# Patient Record
Sex: Female | Born: 1955 | Race: Black or African American | Hispanic: No | Marital: Single | Smoking: Former smoker
Health system: Southern US, Community
[De-identification: ages and names within clinical notes are randomized; demographics above are authoritative.]

## PROBLEM LIST (undated history)

## (undated) DIAGNOSIS — I10 Essential (primary) hypertension: Secondary | ICD-10-CM

## (undated) DIAGNOSIS — A498 Other bacterial infections of unspecified site: Secondary | ICD-10-CM

## (undated) DIAGNOSIS — Z87898 Personal history of other specified conditions: Secondary | ICD-10-CM

## (undated) DIAGNOSIS — A4181 Sepsis due to Enterococcus: Secondary | ICD-10-CM

## (undated) DIAGNOSIS — N186 End stage renal disease: Secondary | ICD-10-CM

## (undated) DIAGNOSIS — D472 Monoclonal gammopathy: Secondary | ICD-10-CM

## (undated) DIAGNOSIS — I5020 Unspecified systolic (congestive) heart failure: Secondary | ICD-10-CM

## (undated) DIAGNOSIS — B20 Human immunodeficiency virus [HIV] disease: Principal | ICD-10-CM

## (undated) DIAGNOSIS — C801 Malignant (primary) neoplasm, unspecified: Secondary | ICD-10-CM

## (undated) DIAGNOSIS — I429 Cardiomyopathy, unspecified: Secondary | ICD-10-CM

## (undated) DIAGNOSIS — I739 Peripheral vascular disease, unspecified: Secondary | ICD-10-CM

## (undated) DIAGNOSIS — I38 Endocarditis, valve unspecified: Secondary | ICD-10-CM

## (undated) DIAGNOSIS — D649 Anemia, unspecified: Secondary | ICD-10-CM

## (undated) DIAGNOSIS — R8271 Bacteriuria: Secondary | ICD-10-CM

## (undated) HISTORY — DX: Cardiomyopathy, unspecified: I42.9

## (undated) HISTORY — DX: Anemia, unspecified: D64.9

## (undated) HISTORY — DX: End stage renal disease: N18.6

## (undated) HISTORY — DX: Monoclonal gammopathy: D47.2

## (undated) HISTORY — DX: Bacteriuria: R82.71

## (undated) HISTORY — DX: Endocarditis, valve unspecified: I38

## (undated) HISTORY — DX: Other bacterial infections of unspecified site: A49.8

## (undated) HISTORY — DX: Personal history of other specified conditions: Z87.898

## (undated) HISTORY — DX: Sepsis due to Enterococcus: A41.81

## (undated) HISTORY — PX: AV FISTULA PLACEMENT: SHX1204

## (undated) HISTORY — PX: VASCULAR SURGERY: SHX849

---

## 1998-09-16 ENCOUNTER — Encounter: Payer: Self-pay | Admitting: Emergency Medicine

## 1998-09-16 ENCOUNTER — Emergency Department (HOSPITAL_COMMUNITY): Admission: EM | Admit: 1998-09-16 | Discharge: 1998-09-16 | Payer: Self-pay | Admitting: Emergency Medicine

## 1998-09-17 ENCOUNTER — Ambulatory Visit (HOSPITAL_COMMUNITY): Admission: RE | Admit: 1998-09-17 | Discharge: 1998-09-17 | Payer: Self-pay | Admitting: Emergency Medicine

## 1998-09-20 ENCOUNTER — Encounter: Admission: RE | Admit: 1998-09-20 | Discharge: 1998-09-20 | Payer: Self-pay | Admitting: Internal Medicine

## 1998-10-30 ENCOUNTER — Encounter: Admission: RE | Admit: 1998-10-30 | Discharge: 1998-10-30 | Payer: Self-pay | Admitting: Internal Medicine

## 1998-12-25 ENCOUNTER — Encounter: Admission: RE | Admit: 1998-12-25 | Discharge: 1998-12-25 | Payer: Self-pay | Admitting: Internal Medicine

## 1999-01-02 ENCOUNTER — Emergency Department (HOSPITAL_COMMUNITY): Admission: EM | Admit: 1999-01-02 | Discharge: 1999-01-02 | Payer: Self-pay | Admitting: Emergency Medicine

## 1999-01-14 ENCOUNTER — Encounter: Payer: Self-pay | Admitting: Internal Medicine

## 1999-01-14 ENCOUNTER — Ambulatory Visit (HOSPITAL_COMMUNITY): Admission: RE | Admit: 1999-01-14 | Discharge: 1999-01-14 | Payer: Self-pay | Admitting: Internal Medicine

## 1999-04-17 ENCOUNTER — Encounter: Admission: RE | Admit: 1999-04-17 | Discharge: 1999-04-17 | Payer: Self-pay | Admitting: Internal Medicine

## 1999-05-02 ENCOUNTER — Ambulatory Visit (HOSPITAL_COMMUNITY): Admission: RE | Admit: 1999-05-02 | Discharge: 1999-05-02 | Payer: Self-pay | Admitting: Internal Medicine

## 1999-06-04 ENCOUNTER — Encounter: Admission: RE | Admit: 1999-06-04 | Discharge: 1999-06-04 | Payer: Self-pay | Admitting: Internal Medicine

## 1999-06-12 ENCOUNTER — Encounter: Admission: RE | Admit: 1999-06-12 | Discharge: 1999-09-10 | Payer: Self-pay | Admitting: Internal Medicine

## 1999-08-28 ENCOUNTER — Encounter: Admission: RE | Admit: 1999-08-28 | Discharge: 1999-08-28 | Payer: Self-pay | Admitting: Internal Medicine

## 2000-05-19 ENCOUNTER — Encounter: Admission: RE | Admit: 2000-05-19 | Discharge: 2000-05-19 | Payer: Self-pay | Admitting: Internal Medicine

## 2000-05-24 ENCOUNTER — Ambulatory Visit (HOSPITAL_BASED_OUTPATIENT_CLINIC_OR_DEPARTMENT_OTHER): Admission: RE | Admit: 2000-05-24 | Discharge: 2000-05-24 | Payer: Self-pay | Admitting: Internal Medicine

## 2000-06-20 ENCOUNTER — Encounter: Payer: Self-pay | Admitting: Internal Medicine

## 2000-06-20 ENCOUNTER — Inpatient Hospital Stay (HOSPITAL_COMMUNITY): Admission: EM | Admit: 2000-06-20 | Discharge: 2000-06-23 | Payer: Self-pay | Admitting: Unknown Physician Specialty

## 2000-06-30 ENCOUNTER — Encounter: Admission: RE | Admit: 2000-06-30 | Discharge: 2000-06-30 | Payer: Self-pay | Admitting: Internal Medicine

## 2000-07-29 ENCOUNTER — Encounter: Admission: RE | Admit: 2000-07-29 | Discharge: 2000-07-29 | Payer: Self-pay | Admitting: Internal Medicine

## 2000-08-04 ENCOUNTER — Encounter: Admission: RE | Admit: 2000-08-04 | Discharge: 2000-11-02 | Payer: Self-pay

## 2000-09-16 ENCOUNTER — Encounter: Admission: RE | Admit: 2000-09-16 | Discharge: 2000-09-16 | Payer: Self-pay | Admitting: Internal Medicine

## 2001-06-30 ENCOUNTER — Encounter: Admission: RE | Admit: 2001-06-30 | Discharge: 2001-06-30 | Payer: Self-pay | Admitting: Internal Medicine

## 2001-08-10 ENCOUNTER — Encounter: Admission: RE | Admit: 2001-08-10 | Discharge: 2001-08-10 | Payer: Self-pay | Admitting: Internal Medicine

## 2002-03-23 ENCOUNTER — Inpatient Hospital Stay (HOSPITAL_COMMUNITY): Admission: EM | Admit: 2002-03-23 | Discharge: 2002-03-28 | Payer: Self-pay | Admitting: Emergency Medicine

## 2002-03-23 ENCOUNTER — Encounter: Payer: Self-pay | Admitting: Infectious Diseases

## 2002-03-24 ENCOUNTER — Encounter: Payer: Self-pay | Admitting: Infectious Diseases

## 2002-03-24 ENCOUNTER — Encounter (INDEPENDENT_AMBULATORY_CARE_PROVIDER_SITE_OTHER): Payer: Self-pay

## 2002-05-24 ENCOUNTER — Encounter: Admission: RE | Admit: 2002-05-24 | Discharge: 2002-05-24 | Payer: Self-pay | Admitting: Internal Medicine

## 2002-06-24 ENCOUNTER — Inpatient Hospital Stay (HOSPITAL_COMMUNITY): Admission: EM | Admit: 2002-06-24 | Discharge: 2002-07-04 | Payer: Self-pay | Admitting: *Deleted

## 2002-06-26 ENCOUNTER — Encounter: Payer: Self-pay | Admitting: Internal Medicine

## 2002-06-30 ENCOUNTER — Encounter: Payer: Self-pay | Admitting: Cardiovascular Disease

## 2002-07-01 ENCOUNTER — Encounter: Payer: Self-pay | Admitting: Internal Medicine

## 2002-07-06 ENCOUNTER — Emergency Department (HOSPITAL_COMMUNITY): Admission: EM | Admit: 2002-07-06 | Discharge: 2002-07-06 | Payer: Self-pay | Admitting: Emergency Medicine

## 2002-07-18 ENCOUNTER — Encounter: Admission: RE | Admit: 2002-07-18 | Discharge: 2002-07-18 | Payer: Self-pay | Admitting: Internal Medicine

## 2003-03-29 ENCOUNTER — Inpatient Hospital Stay (HOSPITAL_COMMUNITY): Admission: EM | Admit: 2003-03-29 | Discharge: 2003-04-01 | Payer: Self-pay | Admitting: Emergency Medicine

## 2003-04-07 ENCOUNTER — Emergency Department (HOSPITAL_COMMUNITY): Admission: EM | Admit: 2003-04-07 | Discharge: 2003-04-07 | Payer: Self-pay | Admitting: Emergency Medicine

## 2006-04-05 ENCOUNTER — Emergency Department (HOSPITAL_COMMUNITY): Admission: EM | Admit: 2006-04-05 | Discharge: 2006-04-05 | Payer: Self-pay | Admitting: Family Medicine

## 2006-11-01 ENCOUNTER — Emergency Department (HOSPITAL_COMMUNITY): Admission: EM | Admit: 2006-11-01 | Discharge: 2006-11-01 | Payer: Self-pay | Admitting: Family Medicine

## 2006-12-30 ENCOUNTER — Emergency Department (HOSPITAL_COMMUNITY): Admission: EM | Admit: 2006-12-30 | Discharge: 2006-12-30 | Payer: Self-pay | Admitting: Emergency Medicine

## 2007-01-21 ENCOUNTER — Other Ambulatory Visit: Admission: RE | Admit: 2007-01-21 | Discharge: 2007-01-21 | Payer: Self-pay | Admitting: Otolaryngology

## 2007-03-19 DIAGNOSIS — N186 End stage renal disease: Secondary | ICD-10-CM

## 2007-03-19 DIAGNOSIS — D638 Anemia in other chronic diseases classified elsewhere: Secondary | ICD-10-CM | POA: Insufficient documentation

## 2007-03-19 DIAGNOSIS — B20 Human immunodeficiency virus [HIV] disease: Secondary | ICD-10-CM | POA: Insufficient documentation

## 2007-03-19 HISTORY — DX: End stage renal disease: N18.6

## 2007-03-20 ENCOUNTER — Ambulatory Visit: Payer: Self-pay | Admitting: Cardiology

## 2007-03-20 ENCOUNTER — Inpatient Hospital Stay (HOSPITAL_COMMUNITY): Admission: EM | Admit: 2007-03-20 | Discharge: 2007-03-27 | Payer: Self-pay | Admitting: General Surgery

## 2007-03-22 ENCOUNTER — Encounter (INDEPENDENT_AMBULATORY_CARE_PROVIDER_SITE_OTHER): Payer: Self-pay | Admitting: Internal Medicine

## 2007-03-25 ENCOUNTER — Ambulatory Visit: Payer: Self-pay | Admitting: Infectious Diseases

## 2007-03-26 ENCOUNTER — Encounter (INDEPENDENT_AMBULATORY_CARE_PROVIDER_SITE_OTHER): Payer: Self-pay | Admitting: Internal Medicine

## 2007-04-15 ENCOUNTER — Encounter: Payer: Self-pay | Admitting: Infectious Diseases

## 2007-04-15 ENCOUNTER — Ambulatory Visit: Payer: Self-pay | Admitting: Internal Medicine

## 2007-04-15 DIAGNOSIS — R609 Edema, unspecified: Secondary | ICD-10-CM | POA: Insufficient documentation

## 2007-04-15 LAB — CONVERTED CEMR LAB
CD4 T Cell Abs: 310
HCV Ab: NEGATIVE
HIV 1 RNA Quant: 17500 copies/mL
Hep B S Ab: NEGATIVE
Hepatitis B Surface Ag: NEGATIVE

## 2007-04-20 ENCOUNTER — Encounter: Payer: Self-pay | Admitting: Infectious Diseases

## 2007-04-26 ENCOUNTER — Ambulatory Visit: Payer: Self-pay | Admitting: Infectious Diseases

## 2007-04-26 ENCOUNTER — Encounter: Admission: RE | Admit: 2007-04-26 | Discharge: 2007-04-26 | Payer: Self-pay | Admitting: Infectious Diseases

## 2007-04-26 LAB — CONVERTED CEMR LAB
ALT: 41 units/L — ABNORMAL HIGH (ref 0–35)
AST: 71 units/L — ABNORMAL HIGH (ref 0–37)
Albumin: 2.8 g/dL — ABNORMAL LOW (ref 3.5–5.2)
Alkaline Phosphatase: 70 units/L (ref 39–117)
BUN: 38 mg/dL — ABNORMAL HIGH (ref 6–23)
CO2: 23 meq/L (ref 19–32)
Calcium: 9 mg/dL (ref 8.4–10.5)
Chlamydia, Swab/Urine, PCR: NEGATIVE
Chloride: 102 meq/L (ref 96–112)
Creatinine, Ser: 2.66 mg/dL — ABNORMAL HIGH (ref 0.40–1.20)
GC Probe Amp, Urine: NEGATIVE
Glucose, Bld: 78 mg/dL (ref 70–99)
HCT: 48.3 % — ABNORMAL HIGH (ref 36.0–46.0)
HCV Ab: NEGATIVE
HIV 1 RNA Quant: 18100 copies/mL — ABNORMAL HIGH (ref <50)
HIV-1 RNA Quant, Log: 4.26 — ABNORMAL HIGH (ref <1.70)
Hemoglobin: 15.9 g/dL — ABNORMAL HIGH (ref 12.0–15.0)
Hep B S Ab: NEGATIVE
Hepatitis B Surface Ag: NEGATIVE
MCHC: 32.9 g/dL (ref 30.0–36.0)
MCV: 100.4 fL — ABNORMAL HIGH (ref 78.0–100.0)
Platelets: 172 10*3/uL (ref 150–400)
Potassium: 4.1 meq/L (ref 3.5–5.3)
RBC: 4.81 M/uL (ref 3.87–5.11)
RDW: 15.3 % (ref 11.5–15.5)
Sodium: 132 meq/L — ABNORMAL LOW (ref 135–145)
Total Bilirubin: 0.6 mg/dL (ref 0.3–1.2)
Total Protein: 10.1 g/dL — ABNORMAL HIGH (ref 6.0–8.3)
WBC: 2.4 10*3/uL — ABNORMAL LOW (ref 4.0–10.5)

## 2007-05-11 ENCOUNTER — Encounter (INDEPENDENT_AMBULATORY_CARE_PROVIDER_SITE_OTHER): Payer: Self-pay | Admitting: *Deleted

## 2007-05-26 ENCOUNTER — Encounter: Admission: RE | Admit: 2007-05-26 | Discharge: 2007-05-26 | Payer: Self-pay | Admitting: Infectious Diseases

## 2007-05-26 ENCOUNTER — Ambulatory Visit: Payer: Self-pay | Admitting: Infectious Diseases

## 2007-05-26 LAB — CONVERTED CEMR LAB
HIV 1 RNA Quant: 10400 copies/mL — ABNORMAL HIGH (ref <50)
HIV-1 RNA Quant, Log: 4.02 — ABNORMAL HIGH (ref <1.70)

## 2007-05-27 LAB — CONVERTED CEMR LAB
BUN: 29 mg/dL — ABNORMAL HIGH (ref 6–23)
CO2: 18 meq/L — ABNORMAL LOW (ref 19–32)
Calcium: 8.1 mg/dL — ABNORMAL LOW (ref 8.4–10.5)
Chloride: 106 meq/L (ref 96–112)
Creatinine, Ser: 3.42 mg/dL — ABNORMAL HIGH (ref 0.40–1.20)
Glucose, Bld: 88 mg/dL (ref 70–99)
Potassium: 3.7 meq/L (ref 3.5–5.3)
Sodium: 135 meq/L (ref 135–145)

## 2007-07-12 ENCOUNTER — Encounter: Payer: Self-pay | Admitting: Infectious Diseases

## 2007-07-14 ENCOUNTER — Encounter (HOSPITAL_COMMUNITY): Admission: RE | Admit: 2007-07-14 | Discharge: 2007-10-12 | Payer: Self-pay | Admitting: Nephrology

## 2007-07-14 ENCOUNTER — Ambulatory Visit: Payer: Self-pay | Admitting: Infectious Diseases

## 2007-07-19 ENCOUNTER — Telehealth: Payer: Self-pay | Admitting: Infectious Diseases

## 2007-09-27 ENCOUNTER — Encounter: Payer: Self-pay | Admitting: Infectious Diseases

## 2007-09-29 ENCOUNTER — Ambulatory Visit: Payer: Self-pay | Admitting: Infectious Diseases

## 2007-10-06 ENCOUNTER — Ambulatory Visit: Payer: Self-pay | Admitting: Vascular Surgery

## 2007-10-18 ENCOUNTER — Ambulatory Visit: Payer: Self-pay | Admitting: Vascular Surgery

## 2007-10-18 ENCOUNTER — Ambulatory Visit (HOSPITAL_COMMUNITY): Admission: RE | Admit: 2007-10-18 | Discharge: 2007-10-19 | Payer: Self-pay | Admitting: Vascular Surgery

## 2007-10-23 ENCOUNTER — Inpatient Hospital Stay (HOSPITAL_COMMUNITY): Admission: EM | Admit: 2007-10-23 | Discharge: 2007-10-26 | Payer: Self-pay | Admitting: Emergency Medicine

## 2007-11-01 ENCOUNTER — Inpatient Hospital Stay (HOSPITAL_COMMUNITY): Admission: EM | Admit: 2007-11-01 | Discharge: 2007-11-05 | Payer: Self-pay | Admitting: Emergency Medicine

## 2007-11-19 ENCOUNTER — Ambulatory Visit: Payer: Self-pay | Admitting: Vascular Surgery

## 2007-11-28 ENCOUNTER — Emergency Department (HOSPITAL_COMMUNITY): Admission: EM | Admit: 2007-11-28 | Discharge: 2007-11-28 | Payer: Self-pay | Admitting: Emergency Medicine

## 2007-11-29 ENCOUNTER — Ambulatory Visit: Payer: Self-pay | Admitting: Surgery

## 2007-11-29 ENCOUNTER — Inpatient Hospital Stay (HOSPITAL_COMMUNITY): Admission: EM | Admit: 2007-11-29 | Discharge: 2007-12-05 | Payer: Self-pay | Admitting: Emergency Medicine

## 2007-11-29 ENCOUNTER — Ambulatory Visit: Payer: Self-pay | Admitting: Pulmonary Disease

## 2007-12-20 ENCOUNTER — Ambulatory Visit: Payer: Self-pay | Admitting: Surgery

## 2008-01-10 ENCOUNTER — Ambulatory Visit: Payer: Self-pay | Admitting: Surgery

## 2008-01-17 ENCOUNTER — Encounter: Payer: Self-pay | Admitting: Infectious Diseases

## 2008-01-19 ENCOUNTER — Inpatient Hospital Stay (HOSPITAL_COMMUNITY): Admission: EM | Admit: 2008-01-19 | Discharge: 2008-01-25 | Payer: Self-pay | Admitting: Emergency Medicine

## 2008-02-07 ENCOUNTER — Ambulatory Visit: Payer: Self-pay | Admitting: Surgery

## 2008-03-06 ENCOUNTER — Ambulatory Visit: Payer: Self-pay | Admitting: Surgery

## 2008-04-18 ENCOUNTER — Ambulatory Visit (HOSPITAL_COMMUNITY): Admission: RE | Admit: 2008-04-18 | Discharge: 2008-04-18 | Payer: Self-pay | Admitting: Nephrology

## 2008-05-05 ENCOUNTER — Ambulatory Visit: Payer: Self-pay | Admitting: Vascular Surgery

## 2008-05-15 ENCOUNTER — Ambulatory Visit (HOSPITAL_COMMUNITY): Admission: RE | Admit: 2008-05-15 | Discharge: 2008-05-15 | Payer: Self-pay | Admitting: Vascular Surgery

## 2008-05-15 ENCOUNTER — Ambulatory Visit: Payer: Self-pay | Admitting: Vascular Surgery

## 2008-05-18 ENCOUNTER — Emergency Department (HOSPITAL_COMMUNITY): Admission: EM | Admit: 2008-05-18 | Discharge: 2008-05-18 | Payer: Self-pay | Admitting: Emergency Medicine

## 2008-06-09 ENCOUNTER — Ambulatory Visit: Payer: Self-pay | Admitting: Vascular Surgery

## 2008-06-14 ENCOUNTER — Ambulatory Visit (HOSPITAL_COMMUNITY): Admission: RE | Admit: 2008-06-14 | Discharge: 2008-06-14 | Payer: Self-pay | Admitting: Surgery

## 2008-06-14 ENCOUNTER — Ambulatory Visit: Payer: Self-pay | Admitting: Surgery

## 2008-07-17 ENCOUNTER — Ambulatory Visit: Payer: Self-pay | Admitting: Surgery

## 2008-07-28 ENCOUNTER — Ambulatory Visit (HOSPITAL_COMMUNITY): Admission: RE | Admit: 2008-07-28 | Discharge: 2008-07-28 | Payer: Self-pay | Admitting: Nephrology

## 2008-10-18 ENCOUNTER — Ambulatory Visit: Payer: Self-pay | Admitting: Infectious Diseases

## 2008-10-18 LAB — CONVERTED CEMR LAB
ALT: 13 units/L (ref 0–35)
AST: 30 units/L (ref 0–37)
Albumin: 2.5 g/dL — ABNORMAL LOW (ref 3.5–5.2)
Alkaline Phosphatase: 63 units/L (ref 39–117)
BUN: 22 mg/dL (ref 6–23)
Basophils Absolute: 0.1 10*3/uL (ref 0.0–0.1)
Basophils Relative: 2 % — ABNORMAL HIGH (ref 0–1)
CO2: 27 meq/L (ref 19–32)
Calcium: 8 mg/dL — ABNORMAL LOW (ref 8.4–10.5)
Chloride: 96 meq/L (ref 96–112)
Creatinine, Ser: 6.74 mg/dL — ABNORMAL HIGH (ref 0.40–1.20)
Eosinophils Absolute: 0 10*3/uL (ref 0.0–0.7)
Eosinophils Relative: 1 % (ref 0–5)
Glucose, Bld: 76 mg/dL (ref 70–99)
HCT: 31.1 % — ABNORMAL LOW (ref 36.0–46.0)
HIV 1 RNA Quant: 47100 copies/mL — ABNORMAL HIGH (ref <48)
HIV-1 RNA Quant, Log: 4.67 — ABNORMAL HIGH (ref <1.68)
Hemoglobin: 9.1 g/dL — ABNORMAL LOW (ref 12.0–15.0)
Lymphocytes Relative: 54 % — ABNORMAL HIGH (ref 12–46)
Lymphs Abs: 1.7 10*3/uL (ref 0.7–4.0)
MCHC: 29.3 g/dL — ABNORMAL LOW (ref 30.0–36.0)
MCV: 109.5 fL — ABNORMAL HIGH (ref >=78.0)
Monocytes Absolute: 0.5 10*3/uL (ref 0.1–1.0)
Monocytes Relative: 14 % — ABNORMAL HIGH (ref 3–12)
Neutro Abs: 1 10*3/uL — ABNORMAL LOW (ref 1.7–7.7)
Neutrophils Relative %: 30 % — ABNORMAL LOW (ref 43–77)
Potassium: 3.7 meq/L (ref 3.5–5.3)
RBC: 2.84 M/uL — ABNORMAL LOW (ref 3.87–5.11)
RDW: 17.2 % — ABNORMAL HIGH (ref 11.5–15.5)
Sodium: 135 meq/L (ref 135–145)
Total Bilirubin: 0.6 mg/dL (ref 0.3–1.2)
Total Protein: 10 g/dL — ABNORMAL HIGH (ref 6.0–8.3)
WBC: 3.2 10*3/uL — ABNORMAL LOW (ref 4.0–10.5)

## 2008-12-04 ENCOUNTER — Encounter: Admission: RE | Admit: 2008-12-04 | Discharge: 2008-12-04 | Payer: Self-pay | Admitting: Nephrology

## 2009-01-03 ENCOUNTER — Telehealth: Payer: Self-pay | Admitting: Infectious Diseases

## 2009-01-10 ENCOUNTER — Ambulatory Visit: Payer: Self-pay | Admitting: Infectious Diseases

## 2009-01-10 ENCOUNTER — Ambulatory Visit (HOSPITAL_COMMUNITY): Admission: RE | Admit: 2009-01-10 | Discharge: 2009-01-10 | Payer: Self-pay | Admitting: Infectious Diseases

## 2009-01-10 LAB — CONVERTED CEMR LAB
HIV 1 RNA Quant: 74300 copies/mL — ABNORMAL HIGH (ref <48)
HIV-1 RNA Quant, Log: 4.87 — ABNORMAL HIGH (ref <1.68)

## 2009-01-16 ENCOUNTER — Encounter: Payer: Self-pay | Admitting: Infectious Diseases

## 2009-01-24 ENCOUNTER — Ambulatory Visit (HOSPITAL_COMMUNITY): Admission: RE | Admit: 2009-01-24 | Discharge: 2009-01-24 | Payer: Self-pay | Admitting: Internal Medicine

## 2009-01-24 ENCOUNTER — Ambulatory Visit: Payer: Self-pay | Admitting: Internal Medicine

## 2009-01-25 ENCOUNTER — Encounter: Payer: Self-pay | Admitting: Internal Medicine

## 2009-03-14 ENCOUNTER — Encounter: Admission: RE | Admit: 2009-03-14 | Discharge: 2009-04-12 | Payer: Self-pay | Admitting: Nephrology

## 2009-04-02 ENCOUNTER — Emergency Department (HOSPITAL_COMMUNITY): Admission: EM | Admit: 2009-04-02 | Discharge: 2009-04-02 | Payer: Self-pay | Admitting: Emergency Medicine

## 2009-04-09 ENCOUNTER — Emergency Department (HOSPITAL_COMMUNITY): Admission: EM | Admit: 2009-04-09 | Discharge: 2009-04-09 | Payer: Self-pay | Admitting: Family Medicine

## 2009-04-17 ENCOUNTER — Inpatient Hospital Stay (HOSPITAL_COMMUNITY): Admission: EM | Admit: 2009-04-17 | Discharge: 2009-04-19 | Payer: Self-pay | Admitting: Emergency Medicine

## 2009-04-17 ENCOUNTER — Encounter: Payer: Self-pay | Admitting: Internal Medicine

## 2009-04-17 ENCOUNTER — Ambulatory Visit: Payer: Self-pay | Admitting: Internal Medicine

## 2009-04-19 ENCOUNTER — Encounter: Payer: Self-pay | Admitting: Internal Medicine

## 2009-05-02 ENCOUNTER — Encounter: Payer: Self-pay | Admitting: Infectious Diseases

## 2009-05-02 ENCOUNTER — Ambulatory Visit: Payer: Self-pay | Admitting: Internal Medicine

## 2009-05-02 DIAGNOSIS — D61818 Other pancytopenia: Secondary | ICD-10-CM | POA: Insufficient documentation

## 2009-05-02 LAB — CONVERTED CEMR LAB
Basophils Absolute: 0 10*3/uL (ref 0.0–0.1)
Basophils Relative: 1 % (ref 0–1)
Eosinophils Absolute: 0 10*3/uL (ref 0.0–0.7)
Eosinophils Relative: 1 % (ref 0–5)
HCT: 41.7 % (ref 36.0–46.0)
HIV 1 RNA Quant: 1880 copies/mL — ABNORMAL HIGH (ref <48)
HIV-1 RNA Quant, Log: 3.27 — ABNORMAL HIGH (ref <1.68)
Hemoglobin: 12 g/dL (ref 12.0–15.0)
Lymphocytes Relative: 45 % (ref 12–46)
Lymphs Abs: 1.4 10*3/uL (ref 0.7–4.0)
MCHC: 28.8 g/dL — ABNORMAL LOW (ref 30.0–36.0)
MCV: 104.8 fL — ABNORMAL HIGH (ref 78.0–100.0)
Monocytes Absolute: 0.4 10*3/uL (ref 0.1–1.0)
Monocytes Relative: 12 % (ref 3–12)
Neutro Abs: 1.3 10*3/uL — ABNORMAL LOW (ref 1.7–7.7)
Neutrophils Relative %: 43 % (ref 43–77)
Platelets: 154 10*3/uL (ref 150–400)
RBC: 3.98 M/uL (ref 3.87–5.11)
RDW: 18 % — ABNORMAL HIGH (ref 11.5–15.5)
WBC: 3.1 10*3/uL — ABNORMAL LOW (ref 4.0–10.5)

## 2009-05-28 ENCOUNTER — Encounter: Admission: RE | Admit: 2009-05-28 | Discharge: 2009-05-28 | Payer: Self-pay | Admitting: Nephrology

## 2009-06-01 ENCOUNTER — Ambulatory Visit: Payer: Self-pay | Admitting: Internal Medicine

## 2009-06-01 ENCOUNTER — Encounter: Payer: Self-pay | Admitting: Internal Medicine

## 2009-06-01 ENCOUNTER — Inpatient Hospital Stay (HOSPITAL_COMMUNITY): Admission: EM | Admit: 2009-06-01 | Discharge: 2009-06-05 | Payer: Self-pay | Admitting: Emergency Medicine

## 2009-06-04 ENCOUNTER — Encounter: Payer: Self-pay | Admitting: Internal Medicine

## 2009-06-04 DIAGNOSIS — I10 Essential (primary) hypertension: Secondary | ICD-10-CM | POA: Insufficient documentation

## 2009-06-06 ENCOUNTER — Ambulatory Visit: Payer: Self-pay | Admitting: Infectious Diseases

## 2009-06-22 ENCOUNTER — Ambulatory Visit: Payer: Self-pay | Admitting: Infectious Diseases

## 2009-06-22 LAB — HM PAP SMEAR: HM Pap smear: NEGATIVE

## 2009-06-26 ENCOUNTER — Emergency Department (HOSPITAL_COMMUNITY): Admission: EM | Admit: 2009-06-26 | Discharge: 2009-06-26 | Payer: Self-pay | Admitting: Emergency Medicine

## 2009-06-27 ENCOUNTER — Ambulatory Visit: Payer: Self-pay | Admitting: Infectious Diseases

## 2009-06-29 ENCOUNTER — Encounter: Payer: Self-pay | Admitting: Infectious Diseases

## 2009-07-02 LAB — CONVERTED CEMR LAB: Pap Smear: NEGATIVE

## 2009-07-23 ENCOUNTER — Inpatient Hospital Stay (HOSPITAL_COMMUNITY): Admission: EM | Admit: 2009-07-23 | Discharge: 2009-07-26 | Payer: Self-pay | Admitting: Emergency Medicine

## 2009-07-23 ENCOUNTER — Encounter: Payer: Self-pay | Admitting: Internal Medicine

## 2009-07-23 ENCOUNTER — Ambulatory Visit: Payer: Self-pay | Admitting: Internal Medicine

## 2009-07-24 ENCOUNTER — Encounter: Payer: Self-pay | Admitting: Internal Medicine

## 2009-07-25 ENCOUNTER — Encounter: Payer: Self-pay | Admitting: Internal Medicine

## 2009-07-28 ENCOUNTER — Inpatient Hospital Stay (HOSPITAL_COMMUNITY): Admission: EM | Admit: 2009-07-28 | Discharge: 2009-07-30 | Payer: Self-pay | Admitting: Emergency Medicine

## 2009-07-28 ENCOUNTER — Ambulatory Visit: Payer: Self-pay | Admitting: Vascular Surgery

## 2009-07-28 ENCOUNTER — Encounter: Payer: Self-pay | Admitting: Internal Medicine

## 2009-07-30 ENCOUNTER — Encounter: Payer: Self-pay | Admitting: Internal Medicine

## 2009-08-03 ENCOUNTER — Ambulatory Visit: Payer: Self-pay | Admitting: Internal Medicine

## 2009-08-03 LAB — CONVERTED CEMR LAB
Basophils Absolute: 0.1 10*3/uL (ref 0.0–0.1)
Basophils Relative: 2 % — ABNORMAL HIGH (ref 0–1)
Eosinophils Absolute: 0 10*3/uL (ref 0.0–0.7)
Eosinophils Relative: 1 % (ref 0–5)
HCT: 32.6 % — ABNORMAL LOW (ref 36.0–46.0)
Hemoglobin: 9.8 g/dL — ABNORMAL LOW (ref 12.0–15.0)
Lymphocytes Relative: 46 % (ref 12–46)
Lymphs Abs: 1.8 10*3/uL (ref 0.7–4.0)
MCHC: 30.1 g/dL (ref 30.0–36.0)
MCV: 110.1 fL — ABNORMAL HIGH (ref >=78.0)
Monocytes Absolute: 0.5 10*3/uL (ref 0.1–1.0)
Monocytes Relative: 14 % — ABNORMAL HIGH (ref 3–12)
Neutro Abs: 1.5 10*3/uL — ABNORMAL LOW (ref 1.7–7.7)
Neutrophils Relative %: 38 % — ABNORMAL LOW (ref 43–77)
Platelets: 206 10*3/uL (ref 150–400)
RBC: 2.96 M/uL — ABNORMAL LOW (ref 3.87–5.11)
RDW: 18.2 % — ABNORMAL HIGH (ref 11.5–15.5)
WBC: 3.8 10*3/uL — ABNORMAL LOW (ref 4.0–10.5)

## 2009-08-09 ENCOUNTER — Telehealth: Payer: Self-pay | Admitting: Internal Medicine

## 2009-08-27 ENCOUNTER — Encounter: Payer: Self-pay | Admitting: Internal Medicine

## 2009-09-03 ENCOUNTER — Ambulatory Visit: Payer: Self-pay | Admitting: Internal Medicine

## 2009-09-03 LAB — CONVERTED CEMR LAB
BUN: 23 mg/dL (ref 6–23)
CO2: 28 meq/L (ref 19–32)
Calcium: 8.1 mg/dL — ABNORMAL LOW (ref 8.4–10.5)
Chloride: 97 meq/L (ref 96–112)
Creatinine, Ser: 5.26 mg/dL — ABNORMAL HIGH (ref 0.40–1.20)
Glucose, Bld: 73 mg/dL (ref 70–99)
Potassium: 4.3 meq/L (ref 3.5–5.3)
Sodium: 133 meq/L — ABNORMAL LOW (ref 135–145)

## 2009-09-24 ENCOUNTER — Inpatient Hospital Stay (HOSPITAL_COMMUNITY): Admission: EM | Admit: 2009-09-24 | Discharge: 2009-09-27 | Payer: Self-pay | Admitting: Emergency Medicine

## 2009-09-24 ENCOUNTER — Ambulatory Visit: Payer: Self-pay | Admitting: Internal Medicine

## 2009-09-24 ENCOUNTER — Encounter: Payer: Self-pay | Admitting: Internal Medicine

## 2009-09-25 ENCOUNTER — Ambulatory Visit: Payer: Self-pay | Admitting: Infectious Disease

## 2009-09-26 ENCOUNTER — Encounter: Payer: Self-pay | Admitting: Internal Medicine

## 2009-11-13 ENCOUNTER — Inpatient Hospital Stay (HOSPITAL_COMMUNITY): Admission: EM | Admit: 2009-11-13 | Discharge: 2009-11-16 | Payer: Self-pay | Admitting: Emergency Medicine

## 2009-11-13 ENCOUNTER — Encounter: Payer: Self-pay | Admitting: Internal Medicine

## 2009-11-13 ENCOUNTER — Ambulatory Visit: Payer: Self-pay | Admitting: Surgery

## 2009-11-13 ENCOUNTER — Ambulatory Visit: Payer: Self-pay | Admitting: Infectious Diseases

## 2009-11-16 ENCOUNTER — Encounter: Payer: Self-pay | Admitting: Internal Medicine

## 2009-12-17 ENCOUNTER — Ambulatory Visit: Payer: Self-pay | Admitting: Internal Medicine

## 2009-12-17 LAB — HM MAMMOGRAPHY

## 2010-01-14 ENCOUNTER — Ambulatory Visit (HOSPITAL_COMMUNITY): Admission: RE | Admit: 2010-01-14 | Payer: Self-pay | Source: Home / Self Care | Admitting: Cardiology

## 2010-01-14 ENCOUNTER — Ambulatory Visit: Admit: 2010-01-14 | Payer: Self-pay

## 2010-01-14 ENCOUNTER — Encounter (INDEPENDENT_AMBULATORY_CARE_PROVIDER_SITE_OTHER): Payer: Self-pay | Admitting: *Deleted

## 2010-01-19 ENCOUNTER — Emergency Department (HOSPITAL_COMMUNITY)
Admission: EM | Admit: 2010-01-19 | Discharge: 2010-01-19 | Payer: Self-pay | Source: Home / Self Care | Admitting: Emergency Medicine

## 2010-01-21 LAB — BASIC METABOLIC PANEL
BUN: 10 mg/dL (ref 6–23)
CO2: 30 mEq/L (ref 19–32)
Calcium: 8 mg/dL — ABNORMAL LOW (ref 8.4–10.5)
Chloride: 95 mEq/L — ABNORMAL LOW (ref 96–112)
Creatinine, Ser: 4 mg/dL — ABNORMAL HIGH (ref 0.4–1.2)
GFR calc Af Amer: 14 mL/min — ABNORMAL LOW (ref >60)
GFR calc non Af Amer: 12 mL/min — ABNORMAL LOW (ref >60)
Glucose, Bld: 80 mg/dL (ref 70–99)
Potassium: 3 mEq/L — ABNORMAL LOW (ref 3.5–5.1)
Sodium: 131 mEq/L — ABNORMAL LOW (ref 135–145)

## 2010-01-21 LAB — DIFFERENTIAL
Basophils Absolute: 0 10*3/uL (ref 0.0–0.1)
Basophils Relative: 1 % (ref 0–1)
Eosinophils Absolute: 0 10*3/uL (ref 0.0–0.7)
Eosinophils Relative: 0 % (ref 0–5)
Lymphocytes Relative: 31 % (ref 12–46)
Lymphs Abs: 0.7 10*3/uL (ref 0.7–4.0)
Monocytes Absolute: 0.2 10*3/uL (ref 0.1–1.0)
Monocytes Relative: 9 % (ref 3–12)
Neutro Abs: 1.3 10*3/uL — ABNORMAL LOW (ref 1.7–7.7)
Neutrophils Relative %: 59 % (ref 43–77)

## 2010-01-21 LAB — CBC
HCT: 34 % — ABNORMAL LOW (ref 36.0–46.0)
Hemoglobin: 10.8 g/dL — ABNORMAL LOW (ref 12.0–15.0)
MCH: 33.1 pg (ref 26.0–34.0)
MCHC: 31.8 g/dL (ref 30.0–36.0)
MCV: 104.3 fL — ABNORMAL HIGH (ref 78.0–100.0)
Platelets: 135 10*3/uL — ABNORMAL LOW (ref 150–400)
RBC: 3.26 MIL/uL — ABNORMAL LOW (ref 3.87–5.11)
RDW: 15.3 % (ref 11.5–15.5)
WBC: 2.3 10*3/uL — ABNORMAL LOW (ref 4.0–10.5)

## 2010-01-21 LAB — APTT: aPTT: 34 seconds (ref 24–37)

## 2010-01-27 ENCOUNTER — Encounter: Payer: Self-pay | Admitting: Internal Medicine

## 2010-01-27 ENCOUNTER — Encounter: Payer: Self-pay | Admitting: Nephrology

## 2010-02-07 NOTE — Miscellaneous (Signed)
Summary: Hospital Admission: 09/24/2009 - Abdominal pain  INTERNAL MEDICINE ADMISSION HISTORY AND PHYSICAL ADMISSION DATE: 09/24/2009 ATTENDING: DR. Eppie Gibson R1 - DR. Shon Baton [id number] R2 - DR. GARG [phone]  PCP: DR. GARG  CC: ABDOMINAL PAIN  HPI: Pt is 55 yo female with PMH outlined below who presents to [facility] ED with main concern of sudden onset, sharp abdominal pain, that started morning of admission and right after her breakfast (she had donought and cool-aid). She describes pain as sharp, constant, 10/10 in severity, nonradiating, and mostly epigastric, with no specific aggrevating or alleviating factors, associated with watery and nonbloody diarrhea (x3 episodes). She denies having similar episodes in the past. In addition, she denies fever at home but was told her temperaure was high in ED (she is not sure how high), no chills, no chest pain, no N/V, she is on HD and her last treatment was on Saturday which she has tolerated well for entire 4 hours. She denies any weakness, no headaches, no vision changes. Reports chronic dry cough and chronic LE swelling.   ALLERGIES: NKDA  PAST MEDICAL HISTORY: HIV disease - [place] 01-2009 K103N, R5 virus.  Hypertension ESRD on HD at [facility], TTS  MEDICATIONS: ACETAMINOPHEN 650 MG 1 tab every four hours as needed for pain EPIVIR 10 MG/ML  Take 1 teaspoon (50mg ) by mouth every day ZERIT 1 MG/ML SOLR Take 4 teaspoons (20 mL) by mouth every day. REYATAZ 300 MG CAPS  Take 1 capsule by mouth once a day NORVIR 100 MG TABS Take 1 tablet by mouth once a day MARINOL 10 MG CAPS Take 1 tablet by mouth once a day PEPCID 20 MG TABS  1 tab by mouth at night ARANESP 150 MCG/0.3ML SOLN (DARBEPOETIN ALFA-POLYSORBATE) Use WEEKLY for Epogen 20000 units. Dialysis nurse to administer ASPIRIN 81 MG  one tab by mouth daily VENOFER 20 MG/ML SOLN  100mg  IV on Tuesday HD (HD RN to dispense) ZEMPLAR 5 MCG/ML SOLN  4 micrograms IV - dispensed by dialysis  nurse NEPHRO-VITE 0.8 MG TABS  one tab by mouth once nightly B COMPLEX  TABS  Take 1 tablet by mouth two times a day ANTI-HIST 25 MG Take 1 tablet by mouth four times a day as needed for leg cramps CARVEDILOL 3.125 MG TABS Take 1 tablet by mouth two times a day   SOCIAL HISTORY: Never Smoked Alcohol use-no Drug use-no current partner of many years, ? fidelity lives with daughter (does not know Dx).  oldest daughter does not dx.   FAMILY HISTORY: Family History of Alcoholism/Addiction Family History Diabetes 1st degree relative  ROS: per HPI  VITALS: T:  98.7 F BP: 99/64 mmHg   P: 89/min   R: 19/min O2SAT: 96%  ON: 2L [place]  PHYSICAL EXAM: General: NAD, cooperative to examination HEENT: atraumatic, PERRLA, EOMI, muddy sclera, no OP erythema, poor dental care, no postnasal drip, white coating on her tongue Neck: supple, full ROM, no thyroid enlargement Resp: fair air movement and decreased sounds at bases B, no wheezing, no ronchi, no tachypnea CVS: RRR, S1/S2, SEM heard best in pulmonic area, 3/6, no gallops, no rubs, no JVD ABD: soft, epigastric tenderness, BS +, no gurading and no rebound, nondistended SKIN: good skin turgor, no rashes, chronic venous stasis changes in B lower  extremity EXT: bilateral lower ext edema L>R, no cyanosis, poor DP and PT pulses bilateraly (difficult to appreciate due to edema, chronic venous stasis MSK: no joint effusions, no tenderness, full ROM in all extremities NEURO:  A & Ox3, strength 5/5 in all 4 extremities and equal, sensation intact to soft touch and position, normal f-to-nose, normal h-to-sheen, no ataxia, no asterixis PSYCH: appropriate.  LABS:  WBC                                      5.5                       Hemoglobin (HGB)                         11.3        Hematocrit (HCT)                         36.4              MCV                                      107.1                             Platelet Count (PLT)                      144          Sodium (NA)                              134         Potassium (K)                            5.7            HEMOLYZED SPECIMEN, RESULTS MAY BE AFFECTED  Chloride                                 102                CO2                                      28                 Glucose                                  86                 BUN                                      21               Creatinine                               6.14  Bilirubin, Total                         1.1                 Alkaline Phosphatase                     101                SGOT (AST)                               45           SGPT (ALT)                               11                 Total  Protein                           8.6        Albumin-Blood                            2.0         Calcium                                  8.2         CXR 09/24/2009:   Stable cardiomegaly and pericardial effusion with associated mild   pulmonary edema.  No new focal airspace disease.  ASSESSMENT AND PLAN:  (1) Abdominal pain - unclear etiology, differential wide and includes viral source, her electrolyte panel is WNL except hyperkalemia but the sample was hemolyzed so will have to repeat it, her BP is somewhat on low side and I suspect this is secondary to her diarrhea and poor oral intake, in addition, she has been running fevers in ED 100 -102 F, there is no leukocytosis on her CBC (but she is immunocompromised due to her O42). She was started on vanc and zosyn and will cont so until we get more test results back, clood cultures were obtained and CT of the abdomen and pelvis is pending. Her lactic acid is 1.6 which is WNL. It could be useful to check procalcitonin to see if this is likely infection vs drug induced. She is on multiple O42 meds and all of them are assoiciated with diarrhea but I do not think this would explain her fevers. Therefore I am nore enclined to think this is viral and it  certainly could be manifestation of O42 itself. Will check viral load and CD4 # (her last CD4 # was 320 in April 2011 with VL of 1,800). Will also check lipase even though my suspicion of pancreatitis is rather low. Certainly all of her ART meds can cause pancreatitis but again I am not sure that it would explain her fevers. Will cont ARV for now pre her home regimen. Will cont zofran and morphine as needed. Keep NPO.  (2) O42 - cont ARV per home regimen and follow up on CD4 and VL (3) HTN - hold home BP meds for now given hypotension (4) VTE PROPH: lovenox 30mg  subq daily

## 2010-02-07 NOTE — Initial Assessments (Signed)
INTERNAL MEDICINE ADMISSION HISTORY AND PHYSICAL  First Contact: Dr. Kelton Pillar [phone] Second Contact: Dr. Dyann Kief [id number]  PCP:Dr. Johnnye Sima  [id number] thigh Cellulitis  HPI:[name] is a 55 year old woman with pmh significant for ESRD on HD, HIV with last CD 4 count of 320 and VL 1880, and HTN who presented to the ED for right thigh pain and rash. Pt states she first noticed rash about 3 days ago. It is progressive in nature and has spread to almost the entire portion of her inner thigh. She states she did feel feverish, however did not document her temperature. She denies chills, chest pain, SOB, cough, nausea, changes in bowel or urinary habits. She denied any drainage or puss coming from the thigh area.    ALLERGIES: Sustiva, Epivir resistance  PAST MEDICAL HISTORY: HIV disease - Last CD 4 count 320 04/27 Hypertension Renal failure   MEDICATIONS:  NORVASC 5 MG TABS (AMLODIPINE BESYLATE) Take 1 tablet by mouth once a day HYDROCODONE-ACETAMINOPHEN 5-500 MG TABS (HYDROCODONE-ACETAMINOPHEN) Take 1-2 tablets by mouth four times a day as needed for pain   SOCIAL HISTORY: Never Smoked Alcohol use-no Drug use-no current partner of many years, ? fidelity lives with daughter (does not know Dx).  oldest daughter does not dx.    FAMILY HISTORY Family History: Family History of Alcoholism/Addiction Family History Diabetes 1st degree relative   ROS:As per HPI  VITALS: T:100.3  P:106  BP: 118/72 R:20  O2SAT: 96% ON:RA  PHYSICAL EXAM:  General:  alert, well-developed, and cooperative to examination.   Head:  normocephalic and atraumatic.   Eyes:  vision grossly intact, pupils equal, pupils round, pupils reactive to light, no injection and anicteric.   Mouth:  pharynx pink and moist, no erythema, and no exudates.   Neck:  supple, full ROM, no thyromegaly, no JVD, and no carotid bruits.   Lungs:  normal respiratory effort, normal breath sounds, no crackles, and no wheezes.  Heart:   normal rate, regular rhythm, no murmur, no gallop, and no rub.   Abdomen: nl BS, nondistended, nontender, no hepatomegaly  Extremities:  Redness, warmth, tenderness to palpation over right inner thigh, no visible lesions or wounds, no drainage, erythema extends from top of inner thigh to knee. Left leg is edematous and is in dark in pigmentation in comparison to right lower leg, this is at baseline per patient, no tenderness over lower extremities, also a small wound located over her left lower extremity, per pt is healing, no visible drainage, scab formation Neurologic:  alert & oriented X3, cranial nerves II-XII intact, strength normal in all extremities, sensation intact to light touch   LABS:  TCO2                                     36                0-100            mmol/L  Ionized Calcium                          1.06       l      1.12-1.32        mmol/L  Hemoglobin (HGB)                         12.2  12.0-15.0        g/dL  Hematocrit (HCT)                         36.0              36.0-46.0        %  Sodium (NA)                              140               135-145          mEq/L  Potassium (K)                            3.3        l      3.5-5.1          mEq/L  Chloride                                 98                96-112           mEq/L  Glucose                                  81                70-99            mg/dL  BUN                                      23                6-23             mg/dL  Creatinine                               4.9        h      0.4-1.2          mg/dL   WBC                                      4.2               4.0-10.5         K/uL  RBC                                      2.86       l      3.87-5.11        MIL/uL  Hemoglobin (HGB)                         10.1       l      12.0-15.0        g/dL  Hematocrit (HCT)                         30.3       l      36.0-46.0        %  MCV                                      105.6      h       78.0-100.0       fL  MCHC                                     33.4              30.0-36.0        g/dL  RDW                                      18.7       h      11.5-15.5        %  Platelet Count (PLT)                     135        l      150-400          K/uL  Neutrophils, %                           67                43-77            %  Lymphocytes, %                           24                12-46            %  Monocytes, %                             8                 3-12             %  Eosinophils, %                           0                 0-5              %  Basophils, %                             0                 0-1              %  Neutrophils, Absolute  2.8               1.7-7.7          K/uL  Lymphocytes, Absolute                    1.0               0.7-4.0          K/uL  Monocytes, Absolute                      0.3               0.1-1.0          K/uL  Eosinophils, Absolute                    0.0               0.0-0.7          K/uL  Basophils, Absolute                      0.0               0.0-0.1          K/uL  Lactic Acid, Venous                      1.1               0.5-2.2          mmol/L  Procalcitonin                            0.74                               ng/mL    ASSESSMENT AND PLAN:  1. RIGHT LEG CELLULITIS Edema, erythema, warmth, pain of the inner right thigh - consistent with cellulitis Given the location/extent of cellulitis, recent admission for cellulitis 04/2009, her co-morbid issues, will admit for IV abx. Plan: Start IV Vancomycin for now Consider transitioning to PO abx, with clinical resolution of symptoms. No need for further imaging studies at this point given that physical exam consistent with cellulitis. Will consult wound care given superficial skin break down over left lower extremity Pt was recently admitted 04/2009 for cellulitis and DM was ruled out with nl CBG and A1C, therefore will not check again 2. ESRD  on HD On TTS schedule Will call renal consult in AM for HD 3. HYPERTENSION Currently well controlled. Restart her home meds of Norvasc.  4. HIV CD4 count recently checked Apr 2011. Last CD 4 count 320, VL 1880 copies HIV meds recently stopped as genotype showed resistance. Pt will follow up with ID as scheduled for possible salvage tx options. 5. HYPOKALEMIA To get HD tomorrow, hence will not replete 6. ANEMIA Chronic anemia 2/2 to ESRD.  Will monitor 7. 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  Her baseline platelet count is around 120-130  without any apparent superficial bleeding or petechiae.   Will continue to monitor. 6. VTE PPX: Heparin   Attending Physician:  I performed and/or observed a history and physical examination of the patient.  I discussed the case with the residents as noted and reviewed the residents' notes.  I agree with the findings and plan--please refer  to the attending physician note for more details. Signature   Printed Name

## 2010-02-07 NOTE — Consult Note (Signed)
Summary: [facility]  [facility]   Imported By: Bonner Puna 02/22/2009 14:09:09  _____________________________________________________________________  External Attachment:    Type:   Image     Comment:   External Document

## 2010-02-07 NOTE — Initial Assessments (Signed)
INTERNAL MEDICINE ADMISSION HISTORY AND PHYSICAL  First Contact: Dr. Kelton Pillar [phone] Second Contact:Dr. Eyvonne Mechanic [id number]  PCP:Dr. Johnnye Sima  [id number]   HPI:Ms. Myrle Sheng is a 55 year old woman with pmh significant for ESRD on HD, HIV with last CD 4 count of 210 [place] [address], and HTN who presented to the ED for lower abdominal pain secondary to a small pimple that pt noticed and started to scratch. Patient noticed increased redness over her lower abdomen 1 day PTA after scratching her abdomen severely. She states she did feel feverish, however did not document her temperature. She denies chills, chest pain, SOB, cough, nausea, changes in bowel or urinary habits. She denied any drainage or puss coming from the abdominal area.   ALLERGIES: NKDA   PAST MEDICAL HISTORY: HIV disease Hypertension Renal failure   MEDICATIONS:  LOPRESSOR 50 MG  TABS (METOPROLOL TARTRATE) one two times a day NU-IRON 150 MG  CAPS (POLYSACCHARIDE IRON COMPLEX) one two times a day NORVASC 10 MG  TABS (AMLODIPINE BESYLATE) one daily SUSTIVA 600 MG TABS (EFAVIRENZ) Take 1 tablet by mouth once a day EPIVIR 10 MG/ML  SOLN (LAMIVUDINE) 55ml by mouth once daily ZERIT 20 MG  CAPS (STAVUDINE) Take 1 tablet by mouth once a day   SOCIAL HISTORY: Never Smoked Alcohol use-no Drug use-no current partner of many years, ? fidelity lives with daughter (does not know Dx).  oldest daughter does not dx.    FAMILY HISTORY Family History: Family History of Alcoholism/Addiction Family History Diabetes 1st degree relative   ROS:As per HPI  VITALS: T:103.1  P:104  BP:114/67  R: 11 O2SAT:96  ON:RA 100.5, 99.2  PHYSICAL EXAM: General:  alert, well-developed, and cooperative to examination.   Head:  normocephalic and atraumatic.   Eyes:  vision grossly intact, pupils equal, pupils round, pupils reactive to light, no injection and anicteric.   Mouth:  pharynx pink and moist, no erythema, and no exudates.   Neck:  supple,  full ROM, no thyromegaly, no JVD, and no carotid bruits.   Lungs:  normal respiratory effort, no accessory muscle use, normal breath sounds, no crackles, and no wheezes.  Heart:  normal rate, regular rhythm, no murmur, no gallop, and no rub.   Abdomen: Redness, warmth, tenderness to palpation over lower abdomen below the umbilicus, more central in region, with subcutaneous edema, BS are present, no guarding, tenderness on palpation over lower abdomen Extremities:  Left leg is edematous and is in dark in pigmentation in comparison to right lower leg, this is at baseline per patient, no tenderness over lower extremities Neurologic:  alert & oriented X3, cranial nerves II-XII intact, strength normal in all extremities, sensation intact to light touch Psych:  Oriented X3, memory intact for recent and remote, normally interactive, good eye contact, not anxious appearing, and not depressed  appearing.   LABS:  Sodium (NA)                              130        l      135-145          mEq/L  Potassium (K)                            3.2        l      3.5-5.1          mEq/L  Chloride                                 94         l      96-112           mEq/L  CO2                                      29                19-32            mEq/L  Glucose                                  84                70-99            mg/dL  BUN                                      28         h      6-23             mg/dL  Creatinine                               6.68       h      0.4-1.2          mg/dL  GFR, Est Non African American            7          l      >60              mL/min  GFR, Est African American                8          l      >60              mL/min    Oversized comment, see footnote  1  Bilirubin, Total                         1.0               0.3-1.2          mg/dL  Alkaline Phosphatase                     70                39-117           U/L  SGOT (AST)                               28                 0-37             U/L  SGPT (ALT)  10                0-35             U/L  Total  Protein                           9.9        h      6.0-8.3          g/dL  Albumin-Blood                            1.6        l      3.5-5.2          g/dL  Calcium                                  8.0        l      8.4-10.5         mg/dL  Lipase                                   22                11-59            U/L   WBC                                      4.3               4.0-10.5         K/uL  RBC                                      3.07       l      3.87-5.11        MIL/uL  Hemoglobin (HGB)                         10.3       l      12.0-15.0        g/dL  Hematocrit (HCT)                         31.6       l      36.0-46.0        %  MCV                                      102.9      h      78.0-100.0       fL  MCHC                                     32.6              30.0-36.0  g/dL  RDW                                      17.5       h      11.5-15.5        %  Platelet Count (PLT)                     122        l      150-400          K/uL  Neutrophils, %                           55                43-77            %  Lymphocytes, %                           36                12-46            %  Monocytes, %                             8                 3-12             %  Eosinophils, %                           0                 0-5              %  Basophils, %                             1                 0-1              %  Neutrophils, Absolute                    2.5               1.7-7.7          K/uL  Lymphocytes, Absolute                    1.5               0.7-4.0          K/uL  Monocytes, Absolute                      0.3               0.1-1.0          K/uL  Eosinophils, Absolute                    0.0               0.0-0.7  K/uL  Basophils, Absolute                      0.0               0.0-0.1          K/uL  RBC Morphology                            SEE NOTE.    POLYCHROMASIA PRESENT   ASSESSMENT AND PLAN 1. LOWER ABDOMINAL WALL CELLULITIS Edema, erythema, warmth, pain of the lower abdominal wall - consistent with cellulitis Given the location/extent of cellulitis, her co-morbid issues, will admit for IV abx. Plan: Start IV Vancomycin for now Consider transitioning to PO abx, with clinical resolution of symptoms. No scope for culture at this point No need for further imaging studies at this point given that physical exam consistent with cellulitis. Will consult wound care given superficial skin break down Consider fasting CBG, HbA1C to R/O DM 2. ESRD on HD On TTS schedule Will call renal consult in AM 3. HYPERTENSION Currently well controlled. Restart her home meds. 4. HIV CD4 count recently checked Jan 2011. Last CD 4 count 210, [place] [address] Cont her home meds for now. 5. HYPOKALEMIA To get HD tomorrow, hence will not replete 6. ANEMIA Chronic anemia Will monitor 7. THROMBOCYOPENIA Chronic. Monitor 6. VTE PPX Heparin   Attending Physician: I performed and/or observed a history and physical examination of the patient.  I discussed the case with the residents as noted and reviewed the residents' notes.  I agree with the findings and plan--please refer to the attending physician note for more details.   Signature  Printed Name

## 2010-02-07 NOTE — Letter (Signed)
Summary: Appointment - Missed  [facility], [place]  [address] [place]   [place], [place] [address]   Phone: [phone]  Fax: [phone]     January 14, 2010 MRN: [id number]   [place], [place] Ms. [place],  [facility] records indicate you missed your appointment on January 9, for your  echocardiogram. It is very important that we reach you to reschedule this appointment. We look forward to participating in your health care needs. Please contact us at the number listed above at your earliest convenience to reschedule this appointment.     Sincerely,  [facility] Scheduling Team

## 2010-02-07 NOTE — Assessment & Plan Note (Signed)
Summary: PAP smear visit /dde   Vital Signs:  Patient profile:   55 year old female Menstrual status:  postmenopausal  Vitals Entered By: Lorne Skeens RN (June 22, 2009 9:32 AM)  Patient Instructions: 1)  Please schedule a follow-up appointment in 1 year.  Pt. needs to obtain a Mammogam. Call to [facility] on [address] [phone]) to discuss currentrxes on file.  Corrected Reyataz 100 rx, discontinued.  Gave rx for Norvir 100 mg tabs. once daily.  Pharmacy corrected error and will fill new rx.  Pt. informed to pick up new rx today and start taking Lorne Skeens RN  June 22, 2009 12:22 PM   CC:  PAP smear visit.  Has no documented PAP smear or Mammogram.  Pt. has return appt. w/ Dr. Johnnye Sima on 06/27/09.  Updated pt's medications in EMR per Dr. Johnnye Sima.  Pt. brought pill bottles to clinic.  Pt. given condoms today at her request.  Pt. given HIV information about self-care, BSE, and nutrition and diet.  Pt. receiving IV abx at Dialysis.  Pt's cervical tissue fragile when obtaining PAP.  Caused slight amount of bleeding.  .  CC: PAP smear visit.  Has no documented PAP smear or Mammogram.  Pt. has return appt. w/ Dr. Johnnye Sima on 06/27/09.  Updated pt's medications in EMR per Dr. Johnnye Sima.  Pt. brought pill bottles to clinic.  Pt. given condoms today at her request.  Pt. given HIV information about self-care, BSE, nutrition and diet.  Pt. receiving IV abx at Dialysis.  Pt's cervical tissue fragile when obtaining PAP.  Caused slight amount of bleeding.    Does patient need assistance? Functional Status Self care Ambulation Impaired:Risk for fall Comments shuffling gait due to lower extremity edema (1+). LMP - Character: normal     Menstrual Status postmenopausal Dr  Medical History  Evaluation and Follow-Up  Prevention For Positives: 06/22/2009   Safe sex practices discussed with patient. Condoms offered. Prior Medications: VANCOMYCIN HCL 750 MG SOLR (VANCOMYCIN HCL) IV,  Tuesday, Thursday, saturday with HD for 2 weeks MUPIROCIN 2 % OINT (MUPIROCIN) twice a day on the wounds on your leg  and in both nostrils for 2 weeks HIBICLENS 4 % LIQD (CHLORHEXIDINE GLUCONATE) use this solution to wipe on your whole body using a cloth once a day for a month. ACETAMINOPHEN 650 MG CR-TABS (ACETAMINOPHEN) 1 tab every four hours as needed for pain Current Allergies: No known allergies  Orders Added: 1)  T-PAP ([place]) [88142] 2)  Est. Patient Level I [id number] Prescriptions: NORVIR 100 MG CAPS (RITONAVIR) Take 1 capsule by mouth once a day  #31 x prn   Entered by:   Lorne Skeens RN   Authorized by:   Bobby Rumpf MD   Signed by:   Lorne Skeens RN on 06/22/2009   Method used:   Print then Give to Patient   RxID:   [id number] REYATAZ 300 MG CAPS (ATAZANAVIR SULFATE) Take 1 capsule by mouth once a day  #31 x prn   Entered by:   Lorne Skeens RN   Authorized by:   Bobby Rumpf MD   Signed by:   Lorne Skeens RN on 06/22/2009   Method used:   Print then Give to Patient   RxID:   [id number] ZERIT 1 MG/ML SOLR (STAVUDINE) Take 4 teaspoons (20 mL) by mouth every day.  #qs/month x prn   Entered by:   Lorne Skeens RN   Authorized by:   Bobby Rumpf MD  Signed by:   Lorne Skeens RN on 06/22/2009   Method used:   Print then Give to Patient   RxID:   [id number] EPIVIR 10 MG/ML SOLN (LAMIVUDINE) Take 1 teaspoon (50mg ) by mouth every day  #qs/month x prn   Entered by:   Lorne Skeens RN   Authorized by:   Bobby Rumpf MD   Signed by:   Lorne Skeens RN on 06/22/2009   Method used:   Print then Give to Patient   RxID:   [id number]             Prevention For Positives: 06/22/2009   Safe sex practices discussed with patient. Condoms offered.

## 2010-02-07 NOTE — Miscellaneous (Signed)
Summary: Hospital Admission  INTERNAL MEDICINE ADMISSION HISTORY AND PHYSICAL Attending: Dr. Lars Mage 1st contact Dr Obie Dredge [phone] 2nd contact Dr Vanessa Kick  [phone]  PCP: Dr. Janell Quiet  Holidays or 5pm on weekdays:  1st contact [phone] 2nd contact [phone]  CC: R Leg pain, redness and fever; chest pain.  HPI: Pt is a 55 yo woman w/ PMH of HIV( last CD4 -150- 9/11), ESRD on HD( T-T-S ) comes to the ED with c/o R leg pain for last 2 days and fever and redness of R leg since last 2-3 weeks. Pt reports noticing redness of her R leg about 2-3 weeks before and is getting worse- starting from above the ankle to inner thigh- almost upto her groin. She also started having fevers around the same time. She started having pain in R leg 2 days PTA and now is getting worse and is 10/10 now, dull.  Pt also c/o chest pain- L sided, at rest, no radiation, intermittent, associated with some palpitation. Pain happens during dialysis sessions and she started noticing this on/off CP since last dialysis on Saturday.   Denies any SOB or exertional pain. She also denies any cough,  abd pain, N/V,diarrhea, urinary abn, anorexia, wt loss, headache.    ALLERGIES: NKA  PAST MEDICAL HISTORY: HIV disease   [place] 01-2009 K103N, R5 virus.  CHF- EF-25-30% in 7/11 ( non - ischemic cardiomyopathy ) Hypertension ESRD on HD ( T-T-S ) GERD   MEDICATIONS: ACETAMINOPHEN 650 MG CR-TABS (ACETAMINOPHEN) 1 tab every four hours as needed for pain EPIVIR 10 MG/ML SOLN (LAMIVUDINE) Take 1 teaspoon (50mg ) by mouth every day ZERIT 1 MG/ML SOLR (STAVUDINE) Take 4 teaspoons (20 mL) by mouth every day. REYATAZ 300 MG CAPS (ATAZANAVIR SULFATE) Take 1 capsule by mouth once a day NORVIR 100 MG TABS (RITONAVIR) Take 1 tablet by mouth once a day MARINOL 10 MG CAPS (DRONABINOL) Take 1 tablet by mouth once a day ARANESP (ALBUMIN FREE) 150 MCG/0.3ML SOLN (DARBEPOETIN ALFA-POLYSORBATE) Use WEEKLY for Epogen 20000 units. Dialysis  nurse to administer ASPIRIN 81 MG TBEC (ASPIRIN) one tab by mouth daily VENOFER 20 MG/ML SOLN (IRON SUCROSE) 100mg  IV on Tuesday HD (HD RN to dispense) ZEMPLAR 5 MCG/ML SOLN (PARICALCITOL) 4 micrograms IV - dispensed by dialysis nurse NEPHRO-VITE 0.8 MG TABS (B COMPLEX-C-FOLIC ACID) one tab by mouth once nightly B COMPLEX  TABS (B COMPLEX VITAMINS) Take 1 tablet by mouth two times a day ENSURE  LIQD (NUTRITIONAL SUPPLEMENTS) Take 1 supplement by mouth three times a day with meals BACTRIM DS 800-160 MG TABS (SULFAMETHOXAZOLE-TRIMETHOPRIM) Take 1 tablet by mouth every Monday, Wednesday, and Friday.   SOCIAL HISTORY: Never Smoked Alcohol use-no Drug use-no current partner of many years, ? fidelity lives with daughter (does not know Dx).  oldest daughter does not dx.    FAMILY HISTORY: Family History of Alcoholism/Addiction Family History Diabetes 1st degree relative   ROS: As per HPI, all other systems reviewed and negative  VITALS: T: 99.9- 103.1  P:104  BP:102/52- 84/44  R:20-32  O2SAT: 91% on RA, 99% on 3 L O2  PHYSICAL EXAM: Gen: Patient is somnolent. Eyes: PERRL, EOMI, No signs of anemia or jaundince. ENT: MMM, OP clear, No erythema, thrush or exudates. Neck: Supple, No carotid Bruits, No JVD, No thyromegaly Resp: CTA- Bilaterally, No W/C/R. CVS: S1S2 RRR, No M/R/G GI: Abdomen is soft. ND, NT, NG, NR, BS+. No organomegaly. Ext: No pedal edema, cyanosis or clubbing. GU: No CVA tenderness. Skin: Redness and warmth  from above R ankle to inner thigh upto groin. Lymph: No palpable lymphadenopathy. MS: Moving all 4 extremities. Tenderness to palpation of R leg and inner thigh. Neuro: A&O X3, CN II - XII are grossly intact. Motor strength is 5/5 in the all extremities, except for RLE where she is having 4/5 due to pain and poor effort. Sensations intact to light touch. Psych: Appropriate   LABS: CBC:  WBC                                      2.3        l      4.0-10.5          K/uL  RBC                                      3.86       l      3.87-5.11        MIL/uL  Hemoglobin (HGB)                         12.6              12.0-15.0        g/dL  Hematocrit (HCT)                         40.8              36.0-46.0        %  MCV                                      105.7      h      78.0-100.0       fL  MCH -                                    32.6              26.0-34.0        pg  MCHC                                     30.9              30.0-36.0        g/dL  RDW                                      16.0       h      11.5-15.5        %  Platelet Count (PLT)                     83         l      150-400          K/uL  Neutrophils, %  65                43-77            %  Lymphocytes, %                           23                12-46            %  Monocytes, %                             12                3-12             %  Eosinophils, %                           0                 0-5              %  Basophils, %                             0                 0-1              %  Neutrophils, Absolute                    1.5        l      1.7-7.7          K/uL  Lymphocytes, Absolute                    0.5        l      0.7-4.0          K/uL  Monocytes, Absolute                      0.3               0.1-1.0          K/uL  Eosinophils, Absolute                    0.0               0.0-0.7          K/uL  Basophils, Absolute                      0.0               0.0-0.1          K/uL   CMET:  Sodium (NA)                              134        l      135-145          mEq/L  Potassium (K)  4.5               3.5-5.1          mEq/L  Chloride                                 98                96-112           mEq/L  CO2                                      23                19-32            mEq/L  Glucose                                  76                70-99            mg/dL  BUN                                       41         h      6-23             mg/dL  Creatinine                               7.58       h      0.4-1.2          mg/dL  GFR, Est Non African American            6          l      >60              mL/min  GFR, Est African American                7          l      >60              mL/min  Bilirubin, Total                         1.0               0.3-1.2          mg/dL  Alkaline Phosphatase                     159        h      39-117           U/L  SGOT (AST)                               106        h      0-37  U/L  SGPT (ALT)                               48         h      0-35             U/L  Total  Protein                           9.7        h      6.0-8.3          g/dL  Albumin-Blood                            2.4        l      3.5-5.2          g/dL  Calcium                                  8.8               8.4-10.5         mg/dL   CXR: IMPRESSION:   Moderate congestive heart failure.   ASSESSMENT AND PLAN: 1) Leg pain, redness, fever: Pt with redness and fever for 2-3 weeks and increasing pain in setting of immunosupression. She met criteria for sepsis when she came to ED with tachycardia, fever, low WBC ( although pt has HIV ) and also RR>20 w/ source of infection (skin ).  Dx most likely due to sepsis 2/2 Cellulitis (with ? bacteremia).  - Will admit to SDU for close follow up of her vitals and stabilization. - Will start her on Vancomycin. - Will do X-ray R leg and thigh to r/o air, i.e. infection w/ gas forming organisms. - Will give morphine as needed for pain  2) Chest pain: L sided dull chest pain in setting of HD, w/o any radiation. Also pt has non-ischemic cardiomyopathy with last EF 25-30%. So the pain in above setting mostly seems to be due to acute volume changes. Nevertheless will cycle CE x3 and do EKG to r/o ACS as pt is at high risk.  3) HIV: Last CD4 count 150. Will continue on home meds for now. Will check CD4 and viral load.  4) ESRD on HD:  Last HD on saturday. Will consult renal for HD management.  5) VTE Proph: Will start her on Heparin subcutaneously 5000units three times a day  6) GERD: continue PPI.

## 2010-02-07 NOTE — Discharge Summary (Signed)
Summary: Hospital Discharge Update    Hospital Discharge Update:  Date of Admission: 07/23/2009 Date of Discharge: 07/25/2009  Brief Summary:  Pt is a 55yo female with a PMHx of HIV and ESRD who presented to [facility] with substernal CP that started on the night before admission with associated SOB with exertion and cough. No radiation to the jaw, arm, or back.  Pain did radiate down into the abdomen. Precipitated by food. CE were cycled x3 and EKG showed no acute cardiac signs of MI. CT angio showed extensive subQ and mediastinal edema +/1 periardial effusion and prominent axillary adenopathy.  2-D echo showed significant LV dysfunciton with EF 20-25%. Cardiology thinks non-ischemic cardiomyopathy.  Will send for F/u Echo in 6 months for reeval.  Lab or other results pending at discharge:  Left axillary lymph node biopsy  Problem list changes:  Removed problem of PNEUMONIA, COMMUNITY ACQUIRED, PNEUMOCOCCAL (ICD-481)  Medication list changes:  Removed medication of FLAGYL 500 MG TABS (METRONIDAZOLE) 4 tab by mouth x1 Added new medication of PEPCID 20 MG TABS (FAMOTIDINE) 1 tab by mouth at night - Signed Added new medication of ARANESP (ALBUMIN FREE) 150 MCG/0.3ML SOLN (DARBEPOETIN ALFA-POLYSORBATE) Use WEEKLY for Epogen 20000 units. Dialysis nurse to administer Added new medication of ASPIRIN 81 MG TBEC (ASPIRIN) one tab by mouth daily - Signed Added new medication of VENOFER 20 MG/ML SOLN (IRON SUCROSE) 100mg  IV on Tuesday HD (HD RN to dispense) Added new medication of LISINOPRIL 2.5 MG TABS (LISINOPRIL) One tab my mouth once nightly - Signed Added new medication of METOPROLOL TARTRATE 25 MG TABS (METOPROLOL TARTRATE) One half tab by mouth twice daily - Signed Added new medication of ZEMPLAR 5 MCG/ML SOLN (PARICALCITOL) 4 micrograms IV - dispensed by dialysis nurse Added new medication of NEPHRO-VITE 0.8 MG TABS (B COMPLEX-C-FOLIC ACID) one tab by mouth once nightly - Signed Added new medication  of COREG 3.125 MG TABS (CARVEDILOL) One tab by mouth twice daily - Signed Removed medication of METOPROLOL TARTRATE 25 MG TABS (METOPROLOL TARTRATE) One half tab by mouth twice daily Changed medication from COREG 3.125 MG TABS (CARVEDILOL) One tab by mouth twice daily to COREG 3.125 MG TABS (CARVEDILOL) One tablet by mouth twice daily - Signed Rx of PEPCID 20 MG TABS (FAMOTIDINE) 1 tab by mouth at night;  #30 x 3;  Signed;  Entered by: Chilton Greathouse DO;  Authorized by: Chilton Greathouse DO;  Method used: Print then Give to Patient Rx of ASPIRIN 81 MG TBEC (ASPIRIN) one tab by mouth daily;  #30 x 3;  Signed;  Entered by: Chilton Greathouse DO;  Authorized by: Chilton Greathouse DO;  Method used: Print then Give to Patient Rx of LISINOPRIL 2.5 MG TABS (LISINOPRIL) One tab my mouth once nightly;  #30 x 3;  Signed;  Entered by: Chilton Greathouse DO;  Authorized by: Chilton Greathouse DO;  Method used: Print then Give to Patient Rx of METOPROLOL TARTRATE 25 MG TABS (METOPROLOL TARTRATE) One half tab by mouth twice daily;  #30 x 3;  Signed;  Entered by: Chilton Greathouse DO;  Authorized by: Chilton Greathouse DO;  Method used: Print then Give to Patient Rx of NEPHRO-VITE 0.8 MG TABS (B COMPLEX-C-FOLIC ACID) one tab by mouth once nightly;  #30 x 2;  Signed;  Entered by: Chilton Greathouse DO;  Authorized by: Chilton Greathouse DO;  Method used: Print then Give to Patient Rx of LISINOPRIL 2.5 MG TABS (LISINOPRIL) One tab my mouth once nightly;  #30 x 3;  Signed;  Entered by: Chilton Greathouse DO;  Authorized by: Chilton Greathouse DO;  Method used: Print then Give to Patient Rx of COREG 3.125 MG TABS (CARVEDILOL) One tab by mouth twice daily;  #60 x 3;  Signed;  Entered by: Chilton Greathouse DO;  Authorized by: Chilton Greathouse DO;  Method used: Print then Give to Patient Rx of COREG 3.125 MG TABS (CARVEDILOL) One tablet by mouth twice daily;  #60 x 3;  Signed;   Entered by: Chilton Greathouse DO;  Authorized by: Chilton Greathouse DO;  Method used: Print then Give to Patient Rx of LISINOPRIL 2.5 MG TABS (LISINOPRIL) One tab my mouth once nightly;  #30 x 3;  Signed;  Entered by: Chilton Greathouse DO;  Authorized by: Chilton Greathouse DO;  Method used: Print then Give to Patient  Discharge medications:  ACETAMINOPHEN 650 MG CR-TABS (ACETAMINOPHEN) 1 tab every four hours as needed for pain EPIVIR 10 MG/ML SOLN (LAMIVUDINE) Take 1 teaspoon (50mg ) by mouth every day ZERIT 1 MG/ML SOLR (STAVUDINE) Take 4 teaspoons (20 mL) by mouth every day. REYATAZ 300 MG CAPS (ATAZANAVIR SULFATE) Take 1 capsule by mouth once a day NORVIR 100 MG TABS (RITONAVIR) Take 1 tablet by mouth once a day MARINOL 10 MG CAPS (DRONABINOL) Take 1 tablet by mouth once a day PEPCID 20 MG TABS (FAMOTIDINE) 1 tab by mouth at night ARANESP (ALBUMIN FREE) 150 MCG/0.3ML SOLN (DARBEPOETIN ALFA-POLYSORBATE) Use WEEKLY for Epogen 20000 units. Dialysis nurse to administer ASPIRIN 81 MG TBEC (ASPIRIN) one tab by mouth daily VENOFER 20 MG/ML SOLN (IRON SUCROSE) 100mg  IV on Tuesday HD (HD RN to dispense) LISINOPRIL 2.5 MG TABS (LISINOPRIL) One tab my mouth once nightly ZEMPLAR 5 MCG/ML SOLN (PARICALCITOL) 4 micrograms IV - dispensed by dialysis nurse NEPHRO-VITE 0.8 MG TABS (B COMPLEX-C-FOLIC ACID) one tab by mouth once nightly COREG 3.125 MG TABS (CARVEDILOL) One tablet by mouth twice daily  Other patient instructions:  1. Please follow-up with Dr. Cathren Laine in the Internal [facility] on August 01, 2009 at 9:00AM. At that time, he will reevaluate your chest pain, he will also follow-up with you about your lymph node biopsy and any additional tests that need to be done.  2. Please follow-up with Dr. Doreatha Lew (cardiology) in 6 months for a follow-up echocardiogram for reevaluation of your heart function. We have called his office on 07/26/09, and they have your contact  information. They will call you for arrangement of a follow-up. If they have not called you in the next 1-2 weeks, please call their office [phone]), and as for a follow-up appointment.   3. If you feel lightheaded or dizzy after taking your blood pressure medications, or if you take your blood pressure and  it is running too low (<80/60), please do not take your next blood pressure medication dose, and first call the clinic for advise.   4. Please follow-up with Dr. Johnnye Sima at your next scheduled visit.  5. Please continue to attend your Hemodialysis as scheduled, Tuesday, Thursday, Saturdays. You will receive medication treatments during your Hemodialysis.   6. If you have chest pain, difficulty breathing, shortness of breath, please report to the ER.   Note: Hospital Discharge Medications & Other Instructions handout was printed, one copy for patient and a second copy to be placed in hospital chart.

## 2010-02-07 NOTE — Letter (Signed)
Summary: Results Follow-up Letter  [facility]  [address] [place]   [place], [place] [address]   Phone: [phone]  Fax: [phone]          June 29, 2009  [address] [facility] [place], [place]  [address]  Dear Ms. Pagliaro,   The following are the results of your recent test(s):  Test     Result     Pap Smear    Normal_XXX___  Not Normal_____       Comments:  I will see you in one year for your next PAP smear.  Thank you for coming to the office for this important annual test.  Sincerely,    [facility]

## 2010-02-07 NOTE — Progress Notes (Signed)
Summary: phone//gg  Phone Note From Other Clinic   Summary of Call: Call received from [facility] with [facility] calling to let us know she is taking the una boot off pt.  The wound on Left leg has healed.  Pt no longer needs to be seen.  She will f/u with Dr Cathren Laine August 29th. If you have any questions please call Jenny Reichmann at [phone] Initial call taken by: Gevena Cotton RN,  August 09, 2009 3:47 PM  Follow-up for Phone Call        Thanks. I will follow up on her. Follow-up by: Janell Quiet MD,  August 10, 2009 1:45 PM

## 2010-02-07 NOTE — Assessment & Plan Note (Signed)
Summary: fukam   CC:  follow-up visit.  History of Present Illness: 55 yo F who was adm to [facility] 03-20-07 with CAP. She was found as well to have ARF and HIV+. CD4 310 and VL 17,500.  Had Echo as well in hosp showing EF 40-50%.  She was started on CBV/sustiva (Genotype naive) since changed to EFV/D4T/3TC for her CRF. Last CD4 380, VL 47,100 (10-18-08). States that she has been taking her meds well. denies missed doses. she states that she came today because she was told to come. She also brings a note saying that her platelet count has been decreasing.  denies de novo nose bleeds or gum bleeds. Has small amt of blood in nares when she blows it but this ceases quickly. having trouble wit coughing. she attributes this to the flu shot she got last fall. cough is worse with laying down to go to sleep and when laying down for HD.  Preventive Screening-Counseling & Management  Alcohol-Tobacco     Alcohol drinks/day: 0     Smoking Status: never     Passive Smoke Exposure: yes  Caffeine-Diet-Exercise     Caffeine use/day: soda     Does Patient Exercise: no     Times/week: 5  Hep-HIV-STD-Contraception     HIV Risk: risk noted  Safety-Violence-Falls     Seat Belt Use: yes  Comments: condoms given      Sexual History:  currently monogamous and boyfriend.        Drug Use:  never.     Updated Prior Medication List: LOPRESSOR 50 MG  TABS (METOPROLOL TARTRATE) one two times a day NU-IRON 150 MG  CAPS (POLYSACCHARIDE IRON COMPLEX) one two times a day NORVASC 10 MG  TABS (AMLODIPINE BESYLATE) one daily SUSTIVA 600 MG TABS (EFAVIRENZ) Take 1 tablet by mouth once a day EPIVIR 10 MG/ML  SOLN (LAMIVUDINE) 50ml by mouth once daily ZERIT 20 MG  CAPS (STAVUDINE) Take 1 tablet by mouth once a day  Current Allergies (reviewed today): No known allergies  Past History:  Past medical, surgical, family and social histories (including risk factors) reviewed, and no changes noted (except as noted  below).  Past Medical History: Reviewed history from 04/26/2007 and no changes required. HIV disease Hypertension Renal failure  Current Medications (verified): 1)  Lopressor 50 Mg  Tabs (Metoprolol Tartrate) .... One Two Times A Day 2)  Nu-Iron 150 Mg  Caps (Polysaccharide Iron Complex) .... One Two Times A Day 3)  Norvasc 10 Mg  Tabs (Amlodipine Besylate) .... One Daily 4)  Sustiva 600 Mg Tabs (Efavirenz) .... Take 1 Tablet By Mouth Once A Day 5)  Epivir 10 Mg/ml  Soln (Lamivudine) .... 53ml By Mouth Once Daily 6)  Zerit 20 Mg  Caps (Stavudine) .... Take 1 Tablet By Mouth Once A Day  Allergies (verified): No Known Drug Allergies   Family History: Reviewed history from 04/26/2007 and no changes required. Family History of Alcoholism/Addiction Family History Diabetes 1st degree relative  Social History: Reviewed history from 04/26/2007 and no changes required. Never Smoked Alcohol use-no Drug use-no current partner of many years, ? fidelity lives with daughter (does not know Dx).  oldest daughter does not dx. Sexual History:  currently monogamous, boyfriend Drug Use:  never  Additional History Menstrual Status:  postmenopausal  Review of Systems       cont to have LE edema. has quit having irregular periods. wt down 6#.  Vital Signs:  Patient profile:   55 year  old female Menstrual status:  postmenopausal Height:      64 inches (162.56 cm) Weight:      172.8 pounds (78.55 kg) BMI:     29.77 Temp:     97.3 degrees F (36.28 degrees C) oral Pulse rate:   116 / minute BP sitting:   144 / 93  (left arm) Cuff size:   large  Vitals Entered By: Lorne Skeens RN (January 10, 2009 2:37 PM) CC: follow-up visit Is Patient Diabetic? No Pain Assessment Patient in pain? no      Nutritional Status BMI of 25 - 29 = overweight Nutritional Status Detail appetite "good"  Have you ever been in a relationship where you felt threatened, hurt or afraid?No   Does patient need  assistance? Functional Status Self care Ambulation Normal Comments no missed doses of rxes LMP - Character: normal     Menstrual Status postmenopausal        Medication Adherence: 01/10/2009   Adherence to medications reviewed with patient. Counseling to provide adequate adherence provided   Prevention For Positives: 01/10/2009   Safe sex practices discussed with patient. Condoms offered.                             Physical Exam  General:  well-hydrated.   Eyes:  pupils equal, pupils round, and pupils reactive to light.   Mouth:  pharynx pink and moist, no exudates, poor dentition, and teeth missing.   Neck:  no masses.   Lungs:  normal respiratory effort.  decreased breath sounds at bases.  Heart:  normal rate, regular rhythm, and no murmur.  normal rate, regular rhythm, and no murmur.   Abdomen:  soft, non-tender, and normal bowel sounds.  soft, non-tender, and normal bowel sounds.   Extremities:  RUE graft with +bruit. non-tender.massive LE edema.   Impression & Recommendations:  Problem # 1:  HIV INFECTION (ICD-042)  i am not clear that she is taking her medicine. will ask her to stop it today, to see if her PLT improves at HD f/u. Will check a genotype and VL, CD4 today. not clear where to go with her regiemen with loss of thymidine analogues. will check CCR5 and [id number]. she wishes to defer her PAP smear until next visit. will check a CXR to evalualte her cough and decreased breath sounds (chronic fluid overload vs GERD) . will try to have her seen by dental.  rtc  1 month.   Orders: Est. Patient Level IV [id number]) T-CBC w/Diff [phone]) T-CD4SP [facility]) (CD4SP) T-Comprehensive Metabolic Panel ([id number]) T-HLA-B*5701 ([id number]) T-RPR (Syphilis) [phone]) T-Lipid Profile [phone]) T-HIV Genotype [id number]) T-HIV Viral Load [id number]) T- * Misc. Laboratory test [phone]) CXR- 2view (CXR)  Problem # 2:  CHRONIC KIDNEY  DISEASE STAGE IV (SEVERE) (ICD-585.4) appreciate partnering with Dr Hassell Done. her graft appears patent today.   Other Orders: T-PAP [facility]) [phone])  Process Orders Check Orders Results:     [facility]: ABN not required for this insurance Tests Sent for requisitioning (January 10, 2009 3:12 PM):     01/10/2009: [facility] -- T-CBC w/Diff [id number] (signed)     01/10/2009: [facility] -- T-Comprehensive Metabolic Panel [id number] (signed)     01/10/2009: [facility] -- T-HLA-B*5701 [[id number]] (signed)     01/10/2009: [facility] -- T-RPR (Syphilis) [phone] (signed)     01/10/2009: [facility] -- T-Lipid Profile [phone] (signed)  01/10/2009: [facility] -- T-HIV Genotype [id number] (signed)     01/10/2009: [facility] -- T-HIV Viral Load [phone] (signed)     01/10/2009: [facility] -- [place]. Laboratory test [phone] (signed)         Medication Adherence: 01/10/2009   Adherence to medications reviewed with patient. Counseling to provide adequate adherence provided    Prevention For Positives: 01/10/2009   Safe sex practices discussed with patient. Condoms offered.

## 2010-02-07 NOTE — Initial Assessments (Signed)
INTERNAL MEDICINE ADMISSION HISTORY AND PHYSICAL Attending: Dr. Eppie Gibson Primary Contact: Wells Guiles Second Contact: Wilburn Cornelia PCP: Dr. Johnnye Sima  CC: Chest Pain  HPI: This is a 55 year old AA female with a past medical history significant for HIV (Last CD4: 320 4/28), and ESRD (baseline creat 3-6, dialysis TRS), who presents with a sudden onset of chest pain last night that started at about 9pm.  The pain was described as sharp, 6/10 intensity and substernal.  The pain radiated down into the abdomen to the umbilicus, but the patient denies any pain radiating to the jaw, arm, or back.  The pain was intermittent and occurred every 30 mins and each event lasted about 71mins. The patient had eaten dinner about 2 hours prior to the episode. Nothing improved or worsened the pain until the patient arrived in the ED and received nitroglycerin. The patient denies any anxiety prior to the event, as well as any increased pain with inspiration, any fever, nausea/vomiting, changes in her BM's, increased fatigue, orthopnea or paroxysmal nocturnal dyspnea.  The patient has, however, experienced some SOB with exertion and cough though both of these have been chronic (>1 year) and have remained unchanged. The patient has never had a similar episode of chest pain in the past.   In the ED, the patient was started on 2L O2 by Mercedes, given aspirin, protonix and nitroglycerin which improved the patients pain somewhat.  A CT angio was performed which showed evidence of pulmonary edema but no PE.  One set of cardiac enzymes was negative and EKG showed no  signs of ischemia.   ALLERGIES:NKDA  PAST MEDICAL HISTORY: Past Medical History: HIV disease   Geno 01-2009 K103N, R5 virus.  Hypertension Renal failure   MEDICATIONS: EPIVIR 10 MG/ML SOLN (LAMIVUDINE) Take 1 teaspoon (50mg ) by mouth every day ZERIT 1 MG/ML SOLR (STAVUDINE) Take 4 teaspoons (20 mL) by mouth every day. REYATAZ 300 MG CAPS (ATAZANAVIR  SULFATE) Take 1 capsule by mouth once a day NORVIR 100 MG TABS (RITONAVIR) Take 1 tablet by mouth once a day MARINOL 10 MG CAPS (DRONABINOL) Take 1 tablet by mouth once a day FLAGYL 500 MG TABS (METRONIDAZOLE) 4 tab by mouth x1   SOCIAL HISTORY: Ms. Janoff lives in [place] with her daughter. She has two children.  Social History: Never Smoked Alcohol use-no Drug use-no current partner of many years, ? fidelity lives with daughter (does not know Dx).  oldest daughter does not dx.   FAMILY HISTORY Family History: Family History of Alcoholism/Addiction Family History Diabetes 1st degree relative Mother: DM Father died of an MI at age 36.    ROS: Negative as per HPI  VITALS: T: 98.0 P:113  BP: 127/83 R: 30 O2SAT:86%  ON:RA  1:22am T: 100.2 P:103  BP: 119/83 R: 18 O2SAT:95%  ON:RA  4:30 am PHYSICAL EXAM: General:  alert, well-developed, and cooperative to examination.   Head:  normocephalic and atraumatic.   Eyes:  vision grossly intact, pupils equal, pupils round, pupils reactive to light, no injection and anicteric.   Mouth:  pharynx pink and moist, no erythema, and no exudates.   Neck:  supple, full ROM, no thyromegaly, no JVD, and no carotid bruits.   Lungs:  normal respiratory effort, no accessory muscle use,  mild scattered wheezes but no crackles. Heart:  The patients chest pain was reproducible with palpation of the chest. normal rate, regular rhythm, 2/6 systolic murmur heard best at the left lower sternal border, no gallop, and no  rub.   Abdomen:  soft, with epigastric tenderness, normal bowel sounds, no distention, no guarding, no rebound tenderness, no hepatomegaly, and no splenomegaly.   Msk:  no joint swelling, no joint warmth, and no redness over joints.   Pulses:  1+ DP/PT pulses bilaterally Extremities:  No cyanosis, clubbing, 1+ pitting edema on the right, 2+ on the left with venous statis changes on the left.  Neurologic:  alert & oriented X3, cranial nerves  II-XII intact, strength normal in all extremities, sensation intact to light touch.   Skin:  turgor normal..   Psych:  Oriented X3, memory intact for recent and remote, normally interactive, good eye contact, not anxious appearing, and not depressed appearing.  LABS:  TCO2                                     32                0-100            mmol/L  Ionized Calcium                          1.10       l      1.12-1.32        mmol/L  Hemoglobin (HGB)                         13.9              12.0-15.0        g/dL  Hematocrit (HCT)                         41.0              36.0-46.0        %  Sodium (NA)                              138               135-145          mEq/L  Potassium (K)                            4.4               3.5-5.1          mEq/L  Chloride                                 99                96-112           mEq/L  Glucose                                  88                70-99            mg/dL  BUN  21                6-23             mg/dL  Creatinine                               5.6        h      0.4-1.2          mg/dL  CKMB, POC                                4.6               1.0-8.0          ng/mL  Troponin I, POC                          <0.05             0.00-0.09        ng/mL  Myoglobin, POC                           >500       H      12-200           ng/mL  WBC                                      3.3        l      4.0-10.5         K/uL  RBC                                      3.43       l      3.87-5.11        MIL/uL  Hemoglobin (HGB)                         11.5       l      12.0-15.0        g/dL  Hematocrit (HCT)                         36.2              36.0-46.0        %  MCV                                      105.6      h      78.0-100.0       fL  MCH -                                    33.6              26.0-34.0        pg  MCHC  31.8              30.0-36.0        g/dL  RDW                                       17.0       h      11.5-15.5        %  Platelet Count (PLT)                     137        l      150-400          K/uL  Neutrophils, %                           51                43-77            %  Lymphocytes, %                           37                12-46            %  Monocytes, %                             11                3-12             %  Eosinophils, %                           0                 0-5              %  Basophils, %                             1                 0-1              %  Neutrophils, Absolute                    1.7               1.7-7.7          K/uL  Lymphocytes, Absolute                    1.2               0.7-4.0          K/uL  Monocytes, Absolute                      0.4               0.1-1.0          K/uL  Eosinophils, Absolute  0.0               0.0-0.7          K/uL  Basophils, Absolute                      0.0               0.0-0.1          K/uL  STUDIES:  CT Angiography: IMPRESSION:    1.  No embolus identified.   2.  Extensive subcutaneous and mediastinal edema with possible   pericardial effusion.  The appearance suggests third spacing of   fluid with right heart failure.   3.  Prominent axillary adenopathy with suspected mediastinal   adenopathy. Lymphoma is not excluded.   4.  Interstitial pulmonary edema.  Chest Xray IMPRESSION:    1.  Cardiomegaly with pulmonary venous hypertension and borderline   interstitial edema.  ASSESSMENT AND PLAN:  This is a 55 year old female with a past medical history significant for HIV and ESRD who present with recent onset of substernal chest pain.   1. Chest Pain: DDX includes GERD, costochondritis, ACS, pneumonia, psychogenic,  PE, and dissection.  At this point although the patient denies prior symptoms of GERD, it is the most likely diagnosis given the proximity of the symptoms to eating, pain that radiates into the abdomen, and epigastric  tenderness on exam.  Costochondritis is also a possibility, however, typically it would not radiate to the abdomen and it would not explain the patients epigastric tenderness.  Thus far, ACS is less likely in the setting of a normal EKG, negative cardiac enzymes and poor history for ACS.  PE and dissection should have been visualized on the CT angiogram which was negative.  Plan:   1. Admit to telemetry renal floor.   2. Continue to cycle cardiac enzymes q8 hours x 3.   3. Consider GI cocktail   4. EKG when patient arrives to floor   5. Check fasting lipid panel     2. Pulmonary and leg edma left > right: DDX includes CHF, abnormal liver function, and DVT. Will do a 2d echo for evaluation of systolic/diastolic dysfunction. Will also check CMET and do bilateral LE doplers to rule out DVT though the edema is chronic and more likely of cardiac origin. Will limit fluid intake to 1.2 L daily, check daily wieghts, and initiate b-blocker and ACEI therapy.   3. Pericardial effusion: chronic, slowly progressive, no evidence of tampanade, could be secondary to CHF, lymphoma, or sarcoma.  Continue to monitor for acute decompensation, no electrical alternans on EKG further evaluation with 2d echo.   4. Significant axillary lymphadanopaythy per CT:.  The patient on further questioning admits to increased fatiuge, loss of appitite and weightloss (10-20 ibs 4/09 dialysis started 9/09) over the last year, there is also a pericardial effusion on the CT scan.  In this setting, lymphoma vs. sarcoma should be considered thus we will get an IR consult for possible IR guided needle biopsy.   5. Chronic renal failure: Pt has a right upper extremity graft, receives dialysis Tuesday, Thursday, and Saturday. Baseline creat 3-6 pt is at her upper limit of normal today, will contact renal service in AM to arrange for dialysis while patient is in house.  Pt denies making any urine now.   6. Elevated temperature x1: Given that  the patient has ESRD, a fever may be blunted.  Given  her temp. increase on admission we will check blood cultures x2 and UA.   7. Anemia: Get anemia panel from dialysis.   8. Abnormal electrolytes: Receive records from pts dialysis center for evaluation.   9. LE edema :  Though this is chronic and unchanged, we will evaluate with bilateral LE dopers.   10. VTE PROPH: lovenox

## 2010-02-07 NOTE — Initial Assessments (Signed)
NTERNAL MEDICINE ADMISSION HISTORY AND PHYSICAL Attending: Dr. Eppie Gibson Primary Contact: Blossom Hoops Second Contact: Eyvonne Mechanic YH:[id number] PCP: Dr. Johnnye Sima  CC: Chest Pain  HPI: This is a 55 year old AA female with a past medical history significant for HIV (Last CD4: 320 4/28), and ESRD (baseline creat 3-6, dialysis TRS), as well as a recent diagnosis of non-ischemic cardiomyopathy with an EF of 20-25% who presents with bleeding dialysis AV graft. History was provided by pt and EMS. She reports walking in middle of night and feeling wet around 9pm last night and found right arm graft to be bleeding heavily. She and her family members tried to apply some pressure on it but the bleeding only got worse and then they decided to call 911 around 1am. Per EMS, they observed an arterial type spray coming from right upper arm at site of graft along with a large amount of blood coating the entire apartment as well as several bloody towels. Pt reporst this has happened with her other arm in the past. She denied any current signs including cp, sob, weakness, lightheadedness. She gets Dialysis on t/th/s, and is due today. She was noted to have fever on arrival, but denied any recent h/o fevers, cough, uti signs, sob, or abd pain. She did not lose consciousness and was reported to have lost about 1L of blood. In the ED, she was  treated with Vanc and Tylenol, 500cc fluid bolus.  Of note the patient was just discharged on the 20th with a new diagnosis of non-ischemic cardiomyopathy with an LV EF of 20-25%. She was discharged on Coreg 3.125mg  and Lisinopril 2.5mg .    ALLERGIES:NKDA  PAST MEDICAL HISTORY: Past Medical History: HIV disease   Geno 01-2009 K103N, R5 virus.  Hypertension Renal failure Non-ischemic cardiomyopathy (EF 20-25% 7//2011)   MEDICATIONS:  ACETAMINOPHEN 650 MG CR-TABS (ACETAMINOPHEN) 1 tab every four hours as needed for pain EPIVIR 10 MG/ML SOLN (LAMIVUDINE) Take 1 teaspoon (50mg ) by  mouth every day ZERIT 1 MG/ML SOLR (STAVUDINE) Take 4 teaspoons (20 mL) by mouth every day. REYATAZ 300 MG CAPS (ATAZANAVIR SULFATE) Take 1 capsule by mouth once a day NORVIR 100 MG TABS (RITONAVIR) Take 1 tablet by mouth once a day MARINOL 10 MG CAPS (DRONABINOL) Take 1 tablet by mouth once a day PEPCID 20 MG TABS (FAMOTIDINE) 1 tab by mouth at night ARANESP (ALBUMIN FREE) 150 MCG/0.3ML SOLN (DARBEPOETIN ALFA-POLYSORBATE) Use WEEKLY for Epogen 20000 units. Dialysis nurse to administer ASPIRIN 81 MG TBEC (ASPIRIN) one tab by mouth daily VENOFER 20 MG/ML SOLN (IRON SUCROSE) 100mg  IV on Tuesday HD (HD RN to dispense) LISINOPRIL 2.5 MG TABS (LISINOPRIL) One tab my mouth once nightly ZEMPLAR 5 MCG/ML SOLN (PARICALCITOL) 4 micrograms IV - dispensed by dialysis nurse NEPHRO-VITE 0.8 MG TABS (B COMPLEX-C-FOLIC ACID) one tab by mouth once nightly COREG 3.125 MG TABS (CARVEDILOL) One tablet by mouth twice daily    SOCIAL HISTORY: Ms. Orfanos lives in [place] with her daughter. She has two children.  Social History: Never Smoked Alcohol use-no Drug use-no current partner of many years, ? fidelity lives with daughter (does not know Dx).  oldest daughter does not dx.   FAMILY HISTORY Family History: Family History of Alcoholism/Addiction Family History Diabetes 1st degree relative Mother: DM Father died of an MI at age 34.    ROS: Negative as per HPI  PHYSICAL EXAM: VITALS:Temp: 101.4, HR: 91, BP:68/49, 92/55, 84/56, RR:22, O2 Sat:95-97% RA General:  alert, well-developed, cooperative to examination.   Head:  normocephalic and atraumatic. Blood coats her forehead and hair on the frontal aspect.  Eyes:  vision grossly intact, pupils equal, pupils round, pupils reactive to light, no injection and anicteric.   Mouth:  pharynx pink and moist, no erythema, and no exudates.   Neck:  supple, full ROM, no thyromegaly, no JVD, and no carotid bruits.   Lungs:  normal respiratory effort, no  accessory muscle use,  mild scattered wheezes but no crackles. Heart:  [id number], rrr, 2/6 systolic murmur heard best at the left sternal border   Abdomen:  soft, normal bowel sounds, no distention, no guarding, no rebound tenderness, no hepatomegaly, and no splenomegaly.   Msk:  no joint swelling, no joint warmth, and no redness over joints.   Pulses:  1+ DP/PT pulses bilaterally Extremities:  No cyanosis, clubbing, 1+ pitting edema on the right, 2+ on the left with venous statis changes on the left. Presence of a 2cm ulcer on the left, appears old and healing. Neurologic:  alert & oriented X3, cranial nerves II-XII intact, strength normal in all extremities, sensation intact to light touch.   Skin:  turgor normal..   Psych:  Oriented X3, memory intact for recent and remote, normally interactive, good eye contact, not anxious appearing, and not depressed appearing.  LABS:         Lactic Acid - 3.5    WBC                                      4.3               4.0-10.5         K/uL  RBC                                      2.82       l      3.87-5.11        MIL/uL  Hemoglobin (HGB)                         9.7        l      12.0-15.0        g/dL  Hematocrit (HCT)                         29.3       l      36.0-46.0        %  MCV                                      104.2      h      78.0-100.0       fL  MCH -                                    34.5       h      26.0-34.0        pg  MCHC  33.1              30.0-36.0        g/dL  RDW                                      17.0       h      11.5-15.5        %  Platelet Count (PLT)                     127        l      150-400          K/uL  Neutrophils, %                           52                43-77            %  Lymphocytes, %                           37                12-46            %  Monocytes, %                             9                 3-12             %  Eosinophils, %                           1                  0-5              %  Basophils, %                             1                 0-1              %  Neutrophils, Absolute                    2.3               1.7-7.7          K/uL  Lymphocytes, Absolute                    1.6               0.7-4.0          K/uL  Monocytes, Absolute                      0.4               0.1-1.0          K/uL  Eosinophils, Absolute  0.0               0.0-0.7          K/uL  Basophils, Absolute                      0.0               0.0-0.1          K/uL  Sodium (NA)                              133        l      135-145          mEq/L  Potassium (K)                            4.4               3.5-5.1          mEq/L  Chloride                                 97                96-112           mEq/L CO2                                      27                19-32            mEq/L  Glucose                                  97                70-99            mg/dL  BUN                                      20                6-23             mg/dL  Creatinine                               5.29       h      0.4-1.2          mg/dL  GFR, Est Non African American            9          l      >60              mL/min  GFR, Est African American                10         l      >60  mL/min    Oversized comment, see footnote  1  Calcium                                  8.4               8.4-10.5         mg/dL  STUDIES:  Chest Xray  IMPRESSION:   Slight apparent decrease in size of the cardiopericardial   silhouette with continued pulmonary vascular congestion.  LN Biopsy (07/24/2009) SPECIMEN(S) OBTAINED   1. Lymph node for lymphoma, Left Axillary   MICROSCOPIC DESCRIPTION   1. The core biopsy reveals hyperplastic lymphoid tissue, predominantly   pericortical hyperplasia. There are some naked germinal centers. The   accompaning flow cytometry [id number]) is negative for a monoclonal b-cell   population. Overall, there  is no definitive evidence of a lymphoproliferative   disorder, and the changes may be secondary to the patient's stated hiv status  ASSESSMENT AND PLAN:  This is a 55 year old female with a past medical history significant for HIV, ESRD, and nonischemic cardiomyopathy who present with recent onset of substernal chest pain.   1. Bleeding R. arm AV graft: unclear explanation as to what triggered the bleeding. However, since she spiked a temperature, it is possible that the graft may be infected. In addition, her overlying skin is very thin, and this may have contributed to the breakdown of the graft site. The bleeding completely stopped upon pressure dressing. -CBC q8hrs * 1 day and transfuse if HB< 7- 8 and symptomatic -Renal consulted and have agreed to inspect the graft site. Ifd it looks infected, they recommend calling vasuclar surgery consult. -Empiric Vanc treatment. F/u blood cx  2. Hypotension - likely 2/2 bleeding and being on antihypertensives. Pt recieved 1L NS in the ED - Hold antihypertensives - pt's systolics improved to the 80s. Given the patient is asymptomatic, will monitor vitals and bolus if sbp drops below 80.  3. Axillary lymphadanopaythy per CT ():.  s/p left axillary IR/US guided LN biopsy. Biopsy reveals hyperplastic LNs consistent with HIV disease and flow cytometry is negative for B or T cell population. 4. ESRD: Currently on TTS schedule. No acute needs for HD. Renal consulted and will follow up on their recommendations. 5. Elevated temperature x1: May be likely 2/2 graft infection. Given  her h/o bacterimia and wound infections, empirically start Vancomycin and check blood cultures x2.  6. HIV - continue home meds 7. LE edema :  Though this is chronic and unchanged. 8. VTE PROPH: SCDs

## 2010-02-07 NOTE — Miscellaneous (Signed)
Summary: [facility] CERTIF[facility]  [facility]   Imported By: Enedina Finner 09/06/2009 12:25:51  _____________________________________________________________________  External Attachment:    Type:   Image     Comment:   External Document

## 2010-02-07 NOTE — Miscellaneous (Signed)
Summary: hospital discharge (abd wall cellulitis)  Hospital Discharge  Date of admission: 04/17/2009  Date of discharge: 04/19/2009  Brief reason for admission/active problems: abd wall cellultits  Followup needed: - completion of doxycycline and resolution of cellultits - reduced norvasc dose due to episodes of hypotension in the hospital  The medication and problem lists have been updated.  Please see the dictated discharge summary for details.   Prescriptions: NORVASC 5 MG TABS (AMLODIPINE BESYLATE) Take 1 tablet by mouth once a day  #30 x 0   Entered and Authorized by:   Lester [place] MD   Signed by:   Lester [place] MD on 04/19/2009   Method used:   Handwritten   RxIDAV:[id number] DOXYCYCLINE HYCLATE 100 MG TABS (DOXYCYCLINE HYCLATE) Take 1 tablet by mouth two times a day for 14 days  #28 x 0   Entered and Authorized by:   Lester [place] MD   Signed by:   Lester [place] MD on 04/19/2009   Method used:   Print then Give to Patient   RxIDFN:[id number]    Patient Instructions: 1)  Your next appointment with Dr. Johnnye Sima is on April 27th at 3 pm.The office is at [address]. (phone: [phone])

## 2010-02-07 NOTE — Assessment & Plan Note (Signed)
Summary: [name]   Vital Signs:  Patient profile:   55 year old female Menstrual status:  postmenopausal Height:      64 inches (162.56 cm) Weight:      163.4 pounds (74.27 kg) BMI:     28.15 Temp:     97.9 degrees F (36.61 degrees C) oral Pulse rate:   115 / minute BP sitting:   135 / 77  (left arm)  Vitals Entered By: [name] (August 03, 2009 10:47 AM) CC: Hospital f/u .States right arm graft continues to bleed. Is Patient Diabetic? No Pain Assessment Patient in pain? no      Nutritional Status BMI of 25 - 29 = overweight  Have you ever been in a relationship where you felt threatened, hurt or afraid?No   Does patient need assistance? Functional Status Self care Ambulation Normal   Primary Care Provider:  Janell Quiet MD  CC:  Hospital f/u .States right arm graft continues to bleed.[name]  History of Present Illness: Patient is 55 year old women with PMH as described in EMR is here today for hospital follow up.  She is complaint with her HD tuesday/thurd/sat. She is getting her antibiotic for left leg cellulitis at HD center. The [facility] is coming to her house to wrap her left leg with unna boot dressings every week and they will be coming this tuesday to rewrap. No fever, chills, nausea, vomiting or change in weight or appetite reported. She does not report change in bowel habits and is good with all her refills. She will be seeing Dr Johnnye Sima on 08/06/2009 and knows that ID has changed their office.  I will draw her CBC with diff to recheck HBG and platelets as directed by the inpatient team.  Depression History:      The patient denies a depressed mood most of the day and a diminished interest in her usual daily activities.         Preventive Screening-Counseling & Management  Alcohol-Tobacco     Alcohol drinks/day: 0     Smoking Status: quit     Year Quit: 2008 0r 2009     Passive Smoke Exposure: yes  Caffeine-Diet-Exercise     Caffeine  use/day: soda and tea     Does Patient Exercise: no     Times/week: 5  Problems Prior to Update: 1)  Hypertension, Mild  (ICD-401.1) 2)  Thrombocytopenia  (ICD-287.5) 3)  Cellulitis, Thigh, Right  (ICD-682.6) 4)  Abscess, Face  (ICD-682.0) 5)  Screening For Malignant Neoplasm of The Cervix  (ICD-V76.2) 6)  Anemia of Chronic Disease  (ICD-285.29) 7)  Leg Edema, Chronic  (ICD-782.3) 8)  Chronic Kidney Disease Stage Iv (SEVERE)  (ICD-585.4) 9)  HIV Infection  (ICD-042) 10)  Congestive Cardiomyopathy  (ICD-425.4)  Medications Prior to Update: 1)  Acetaminophen 650 Mg Cr-Tabs (Acetaminophen) .[name].. 1 Tab Every Four Hours As Needed For Pain 2)  Epivir 10 Mg/ml Soln (Lamivudine) .... Take 1 Teaspoon (50mg ) By Mouth Every Day 3)  Zerit 1 Mg/ml Solr (Stavudine) .... Take 4 Teaspoons (20 Ml) By Mouth Every Day. 4)  Reyataz 300 Mg Caps (Atazanavir Sulfate) .... Take 1 Capsule By Mouth Once A Day 5)  Norvir 100 Mg Tabs (Ritonavir) .... Take 1 Tablet By Mouth Once A Day 6)  Marinol 10 Mg Caps (Dronabinol) .... Take 1 Tablet By Mouth Once A Day 7)  Pepcid 20 Mg Tabs (Famotidine) .[name].. 1 Tab By Mouth At Night 8)  Aranesp (Albumin  Free) 150 Mcg/0.26ml Soln (Darbepoetin Alfa-Polysorbate) .... Use Weekly For Epogen 20000 Units. Dialysis Nurse To Administer 9)  Aspirin 81 Mg Tbec (Aspirin) .... One Tab By Mouth Daily 10)  Venofer 20 Mg/ml Soln (Iron Sucrose) .... 100mg  Iv On Tuesday Hd (Hd [name] To Dispense) 11)  Lisinopril 2.5 Mg Tabs (Lisinopril) .... One Tab My Mouth Once Nightly 12)  Zemplar 5 Mcg/ml Soln (Paricalcitol) .... 4 Micrograms Iv - Dispensed By Dialysis Nurse 13)  Nephro-Vite 0.8 Mg Tabs (B Complex-C-Folic Acid) .... One Tab By Mouth Once Nightly 14)  Coreg 3.125 Mg Tabs (Carvedilol) .... One Tablet By Mouth Twice Daily  Current Medications (verified): 1)  Acetaminophen 650 Mg Cr-Tabs (Acetaminophen) .[name].. 1 Tab Every Four Hours As Needed For Pain 2)  Epivir 10 Mg/ml Soln (Lamivudine) .... Take  1 Teaspoon (50mg ) By Mouth Every Day 3)  Zerit 1 Mg/ml Solr (Stavudine) .... Take 4 Teaspoons (20 Ml) By Mouth Every Day. 4)  Reyataz 300 Mg Caps (Atazanavir Sulfate) .... Take 1 Capsule By Mouth Once A Day 5)  Norvir 100 Mg Tabs (Ritonavir) .... Take 1 Tablet By Mouth Once A Day 6)  Marinol 10 Mg Caps (Dronabinol) .... Take 1 Tablet By Mouth Once A Day 7)  Pepcid 20 Mg Tabs (Famotidine) .[name].. 1 Tab By Mouth At Night 8)  Aranesp (Albumin Free) 150 Mcg/0.10ml Soln (Darbepoetin Alfa-Polysorbate) .... Use Weekly For Epogen 20000 Units. Dialysis Nurse To Administer 9)  Aspirin 81 Mg Tbec (Aspirin) .... One Tab By Mouth Daily 10)  Venofer 20 Mg/ml Soln (Iron Sucrose) .... 100mg  Iv On Tuesday Hd (Hd [name] To Dispense) 11)  Zemplar 5 Mcg/ml Soln (Paricalcitol) .... 4 Micrograms Iv - Dispensed By Dialysis Nurse 12)  Nephro-Vite 0.8 Mg Tabs (B Complex-C-Folic Acid) .... One Tab By Mouth Once Nightly 13)  Cefazolin 2gm Iv .... To Be Administered During Dialysis On Trs Schedule  Allergies (verified): No Known Drug Allergies  Past History:  Past Medical History: Last updated: 06/06/2009 HIV disease   Geno 01-2009 K103N, R5 virus.  Hypertension Renal failure  Family History: Last updated: 04/26/2007 Family History of Alcoholism/Addiction Family History Diabetes 1st degree relative  Social History: Last updated: 01/10/2009 Never Smoked Alcohol use-no Drug use-no current partner of many years, ? fidelity lives with daughter (does not know Dx).  oldest daughter does not dx.   Risk Factors: Alcohol Use: 0 (08/03/2009) Caffeine Use: soda and tea (08/03/2009) Exercise: no (08/03/2009)  Risk Factors: Smoking Status: quit (08/03/2009) Passive Smoke Exposure: yes (08/03/2009)  Family History: Reviewed history from 04/26/2007 and no changes required. Family History of Alcoholism/Addiction Family History Diabetes 1st degree relative  Social History: Reviewed history from 01/10/2009 and no  changes required. Never Smoked Alcohol use-no Drug use-no current partner of many years, ? fidelity lives with daughter (does not know Dx).  oldest daughter does not dx. Smoking Status:  quit  Review of Systems      See HPI  Physical Exam  Additional Exam:  Gen: AOx3, in no acute distress Eyes: PERRL, EOMI ENT:MMM, No erythema noted in posterior pharynx Neck: No JVD, No LAP Chest: CTAB with  good respiratory effort, no ronchi, wheeze, crackles CVS: regular rhythmic rate but tachycardic, grade IV/VI systolic murmur due to AV graft Abdo: soft,ND, BS+x4, Non tender and No hepatosplenomegaly EXT: left lower ext coated with unna boots, right upper ext with AV graft site and previous graft site wrapped since hospitalization. no redness, discharge or bleeding seen, site looks healed Neuro: Non focal,  gait is normal   Impression & Recommendations:  Problem # 1:  HYPERTENSION, MILD (ICD-401.1) Assessment Unchanged Patient was recently started on Coreg while hospitalization and other antihypertensive meds were discontinued 2/2 hypotension due to bleed. I will not add any new meds today though with CKD her goal should be <130/80 if not 120/80.   BP today: 135/77 Prior BP: 135/84 (06/27/2009)  Labs Reviewed: K+: 3.2 (01/10/2009) Creat: : 4.89 (01/10/2009)   Chol: 117 (01/10/2009)   HDL: 22 (01/10/2009)   LDL: 76 (01/10/2009)   TG: 96 (01/10/2009)  Problem # 2:  THROMBOCYTOPENIA (ICD-287.5) Assessment: Unchanged I will check CBC today to monitor platelets. Orders: T-CBC w/Diff [id number])  Problem # 3:  LEG EDEMA, CHRONIC (ICD-782.3) Assessment: Unchanged Chronic left leg edema and cellulitis is being treated with Cefazolin at HD every other day. Plan to continue Unna boot wrappings and continue Cefazolin for a total of 2 months.  Discussed elevation of the legs, use of compression stockings, sodium restiction, and medication use.   Problem # 4:  WOUND, ARM  (ICD-884.0) Assessment: Improved Patient had a clot in her AV graft and did lose a lot blood during her hospitalization recently. The wound seems to be healing well and I rewrapped the wound with a gauge. A new AV graft site above the originalsite on right arm looks good.  Complete Medication List: 1)  Acetaminophen 650 Mg Cr-tabs (Acetaminophen) .[name].. 1 tab every four hours as needed for pain 2)  Epivir 10 Mg/ml Soln (Lamivudine) .... Take 1 teaspoon (50mg ) by mouth every day 3)  Zerit 1 Mg/ml Solr (Stavudine) .... Take 4 teaspoons (20 ml) by mouth every day. 4)  Reyataz 300 Mg Caps (Atazanavir sulfate) .... Take 1 capsule by mouth once a day 5)  Norvir 100 Mg Tabs (Ritonavir) .... Take 1 tablet by mouth once a day 6)  Marinol 10 Mg Caps (Dronabinol) .... Take 1 tablet by mouth once a day 7)  Pepcid 20 Mg Tabs (Famotidine) .[name].. 1 tab by mouth at night 8)  Aranesp (albumin Free) 150 Mcg/0.59ml Soln (Darbepoetin alfa-polysorbate) .... Use weekly for epogen 20000 units. dialysis nurse to administer 9)  Aspirin 81 Mg Tbec (Aspirin) .... One tab by mouth daily 10)  Venofer 20 Mg/ml Soln (Iron sucrose) .... 100mg  iv on tuesday hd (hd [name] to dispense) 11)  Zemplar 5 Mcg/ml Soln (Paricalcitol) .... 4 micrograms iv - dispensed by dialysis nurse 12)  Nephro-vite 0.8 Mg Tabs (B complex-c-folic acid) .... One tab by mouth once nightly 13)  Cefazolin 2gm Iv  .... To be administered during dialysis on trs schedule  Patient Instructions: 1)  Please schedule a follow-up appointment in 1 month. 2)  Limit your Sodium (Salt). 3)  Check your Blood Pressure regularly. If it is above:160/100 you should make an appointment.  Prevention & Chronic Care Immunizations   Influenza vaccine: Fluvax 3+  (10/18/2008)   Influenza vaccine deferral: Deferred  (08/03/2009)    Tetanus booster: Not documented   Td booster deferral: Deferred  (08/03/2009)    Pneumococcal vaccine: Pneumovax  (04/26/2007)  Colorectal  Screening   Hemoccult: Not documented   Hemoccult action/deferral: Deferred  (08/03/2009)    Colonoscopy: Not documented   Colonoscopy action/deferral: Deferred  (08/03/2009)  Other Screening   Pap smear: NEGATIVE FOR INTRAEPITHELIAL LESIONS OR MALIGNANCY. BENIGN REACTIVE/REPARATIVE CHANGES.  (06/22/2009)    Mammogram: Not documented   Mammogram action/deferral: Deferred  (08/03/2009)   Smoking status: quit  (08/03/2009)  Lipids   Total Cholesterol:  117  (01/10/2009)   LDL: 76  (01/10/2009)   LDL Direct: Not documented   HDL: 22  (01/10/2009)   Triglycerides: 96  (01/10/2009)  Hypertension   Last Blood Pressure: 135 / 77  (08/03/2009)   Serum creatinine: 4.89  (01/10/2009)   Serum potassium 3.2  (01/10/2009)    Hypertension flowsheet reviewed?: Yes   Progress toward BP goal: At goal  Self-Management Support :   Personal Goals (by the next clinic visit) :      Personal blood pressure goal: 140/90  (08/03/2009)   Patient will work on the following items until the next clinic visit to reach self-care goals:     Medications and monitoring: take my medicines every day, check my blood sugar, bring all of my medications to every visit  (08/03/2009)     Eating: use fresh or frozen vegetables, eat foods that are low in salt, eat baked foods instead of fried foods  (08/03/2009)     Activity: take a 30 minute walk every day  (08/03/2009)    Hypertension self-management support: Education handout, Resources for patients handout, Written self-care plan  (08/03/2009)   Hypertension self-care plan printed.   Hypertension education handout printed      Resource handout printed.  Process Orders Check Orders Results:     [facility]: [id number] not required for this insurance Tests Sent for requisitioning (August 06, 2009 2:02 PM):     08/03/2009: [facility] -- [facility] w/Diff [id number] (signed)     Process Orders Check Orders Results:     [facility]: [id number] not required for this insurance Tests Sent for requisitioning (August 06, 2009 2:02 PM):     08/03/2009: [facility] -- [facility] w/Diff [id number] (signed)

## 2010-02-07 NOTE — Assessment & Plan Note (Signed)
Summary: EST-1 MONTH F/U VISIT/CH   Vital Signs:  Patient profile:   55 year old female Menstrual status:  postmenopausal Height:      64 inches (162.56 cm) Weight:      169.6 pounds (77.09 kg) BMI:     29.22 Temp:     98.3 degrees F (36.83 degrees C) oral Pulse rate:   103 / minute BP sitting:   144 / 85  (left arm) Cuff size:   regular  Vitals Entered By: Mateo Flow Deborra Medina) (September 03, 2009 2:09 PM) CC: 27mth f/u-lower leg edema (chronic) Is Patient Diabetic? No Pain Assessment Patient in pain? no      Nutritional Status BMI of 25 - 29 = overweight  Have you ever been in a relationship where you felt threatened, hurt or afraid?No   Does patient need assistance? Functional Status Self care Ambulation Normal   Primary Care Provider:  Janell Quiet MD  CC:  63mth f/u-lower leg edema (chronic).  History of Present Illness: Patient is 55 year old women with PMH as described in EMR is here today for regular follow up.  She is compliant with her HD tuesday/thur/sat. She is getting her antibiotic for left leg cellulitis at HD center. Her left leg cellulitis is completely healed but the antibiotics will be continued for a total of 2 months as per ID recommendations.  No fever, chills, nausea, vomiting or change in weight or appetite reported. She does not report change in bowel habits and is good with all her refills.  She complains to me of leg cramps which are more pronounced on the days of her HD. No other exacerbatiing or relieving factors. Her sleeping is adequate.   Bp is elevated today. She is not on any BP meds they were discontinued while she was hospitalized due to hypotension.  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 1 Tab Every Four Hours As Needed For Pain 2)  Epivir 10 Mg/ml Soln (Lamivudine) .... Take 1 Teaspoon (50mg ) By Mouth Every Day 3)  Zerit 1 Mg/ml Solr (Stavudine) .... Take 4 Teaspoons (20 Ml) By Mouth Every Day. 4)  Reyataz 300 Mg Caps (Atazanavir Sulfate) .... Take 1 Capsule By Mouth Once A Day 5)  Norvir 100 Mg Tabs (Ritonavir) .... Take 1 Tablet By Mouth Once A Day 6)  Marinol 10 Mg Caps (Dronabinol) .... Take 1 Tablet By Mouth Once A Day 7)  Pepcid 20 Mg Tabs (Famotidine) .[name].. 1 Tab By Mouth At Night 8)  Aranesp (Albumin Free) 150 Mcg/0.67ml Soln (Darbepoetin Alfa-Polysorbate) .... Use Weekly For Epogen 20000 Units. Dialysis Nurse To Administer 9)  Aspirin 81 Mg Tbec (Aspirin) .... One Tab By Mouth Daily 10)  Venofer 20 Mg/ml Soln (Iron Sucrose) .... 100mg  Iv On Tuesday Hd (Hd Rn To Dispense) 11)  Zemplar 5 Mcg/ml Soln (Paricalcitol) .... 4 Micrograms Iv - Dispensed By Dialysis Nurse 12)  Nephro-Vite 0.8 Mg Tabs (B Complex-C-Folic Acid) .... One Tab By Mouth Once Nightly  Current Medications (verified): 1)  Acetaminophen 650 Mg Cr-Tabs (Acetaminophen) .[name].. 1 Tab Every Four Hours As Needed For Pain 2)  Epivir 10 Mg/ml Soln (Lamivudine) .... Take 1 Teaspoon (50mg ) By Mouth Every Day 3)  Zerit 1 Mg/ml Solr (Stavudine) .... Take 4 Teaspoons (20 Ml) By Mouth Every Day. 4)  Reyataz 300 Mg Caps (Atazanavir Sulfate) .... Take 1 Capsule By Mouth Once A Day 5)  Norvir 100 Mg Tabs (Ritonavir) .... Take 1 Tablet By Mouth Once A Day 6)  Marinol 10 Mg Caps (Dronabinol) .... Take 1 Tablet By Mouth Once A Day 7)  Pepcid 20 Mg Tabs (Famotidine) .[name].. 1 Tab By Mouth At Night 8)  Aranesp (Albumin Free) 150 Mcg/0.27ml Soln (Darbepoetin  Alfa-Polysorbate) .... Use Weekly For Epogen 20000 Units. Dialysis Nurse To Administer 9)  Aspirin 81 Mg Tbec (Aspirin) .... One Tab By Mouth Daily 10)  Venofer 20 Mg/ml Soln (Iron Sucrose) .... 100mg  Iv On Tuesday Hd (Hd Rn To Dispense) 11)  Zemplar 5 Mcg/ml Soln (Paricalcitol) .... 4 Micrograms Iv - Dispensed By Dialysis Nurse 12)  Nephro-Vite 0.8 Mg Tabs (B Complex-C-Folic Acid) .... One Tab By Mouth Once Nightly 13)  B Complex  Tabs (B Complex Vitamins) .... Take 1 Tablet By Mouth Two Times A Day 14)  Anti-Hist 25 Mg Caps (Diphenhydramine Hcl) .... Take 1 Tablet By Mouth Four Times A Day As Needed For Leg Cramps 15)  Carvedilol 3.125 Mg Tabs (Carvedilol) .... Take 1 Tablet By Mouth Two Times A Day  Allergies (verified): No Known Drug Allergies  Past History:  Past Medical History: Last updated: 06/06/2009 HIV disease   Geno 01-2009 K103N, R5 virus.  Hypertension Renal failure  Family History: Last updated: 04/26/2007 Family History of Alcoholism/Addiction Family History Diabetes 1st degree relative  Social History: Last updated: 01/10/2009 Never Smoked Alcohol use-no Drug use-no current partner of many years, ? fidelity lives with daughter (does not know Dx).  oldest daughter does not dx.   Risk Factors: Alcohol Use: 0 (08/03/2009) Caffeine Use: soda and tea (08/03/2009) Exercise: no (08/03/2009)  Risk Factors: Smoking Status: quit (08/03/2009) Passive Smoke Exposure: yes (08/03/2009)  Family History: Reviewed history from 04/26/2007 and no changes required. Family History of Alcoholism/Addiction Family History Diabetes 1st degree relative  Social History: Reviewed history from 01/10/2009 and no changes required. Never Smoked Alcohol use-no Drug use-no current partner of many years, ? fidelity lives with daughter (does not know Dx).  oldest daughter does not dx.   Review of Systems      See HPI  Physical Exam  Additional Exam:  Gen: AOx3, in no acute  distress Eyes: PERRL, EOMI ENT:MMM, No erythema noted in posterior pharynx Neck: No JVD, No LAP Chest: CTAB with  good respiratory effort CVS: regular rhythmic rate, flow murmur throughout Abdo: soft,ND, BS+x4, Non tender and No hepatosplenomegaly EXT: 2+ pitting edema in b/l legs upto knees right>left, wound on right shin resolved Neuro: Non focal, gait is normal Skin: no rashes noted.    Impression & Recommendations:  Problem # 1:  WOUND, ARM (ICD-884.0) Assessment Improved Improved. Continue antibiotics for another month as per ID.  Problem # 2:  HYPERTENSION, MILD (ICD-401.1) Assessment: Deteriorated I will restart her coreg today. Her updated medication list for this problem includes:    Carvedilol 3.125 Mg Tabs (Carvedilol) .[name]... Take 1 tablet by mouth two times a day  Orders: T-Basic Metabolic Panel (99991111)  BP today: 144/85 Prior BP: 135/77 (08/03/2009)  Labs Reviewed: K+: 3.2 (01/10/2009)  Creat: : 4.89 (01/10/2009)   Chol: 117 (01/10/2009)   HDL: 22 (01/10/2009)   LDL: 76 (01/10/2009)   TG: 96 (01/10/2009)  Problem # 3:  LEG EDEMA, CHRONIC (ICD-782.3) Assessment: Improved Much improved now.  Discussed elevation of the legs, use of compression stockings, sodium restiction, and medication use.   Problem # 4:  HIV INFECTION (ICD-042) Assessment: Unchanged She has a follow up appointment with Dr Johnnye Sima.  Problem # 5:  CHRONIC KIDNEY DISEASE STAGE IV (SEVERE) (ICD-585.4) Assessment: Unchanged She is on HD tues/thurs/sat.  Labs Reviewed: BUN: 16 (01/10/2009)   Cr: 4.89 (01/10/2009)    Hgb: 9.8 (08/03/2009)   Hct: 32.6 (08/03/2009)   Ca++: 7.8 (01/10/2009)    TP: 10.1 (01/10/2009)   Alb: 2.4 (01/10/2009) HBSAg: Negative (04/26/2007)   HBSAb: Negative (04/26/2007)  Problem # 6:  LEG CRAMPS (ICD-729.82) Assessment: New We will start her on Vit B complex and benadryl.  Problem # 7:  Preventive Health Care (ICD-V70.0) Assessment: Comment  Only Mammogram hemoccults.  Complete Medication List: 1)  Acetaminophen 650 Mg Cr-tabs (Acetaminophen) .[name].. 1 tab every four hours as needed for pain 2)  Epivir 10 Mg/ml Soln (Lamivudine) .... Take 1 teaspoon (50mg ) by mouth every day 3)  Zerit 1 Mg/ml Solr (Stavudine) .... Take 4 teaspoons (20 ml) by mouth every day. 4)  Reyataz 300 Mg Caps (Atazanavir sulfate) .... Take 1 capsule by mouth once a day 5)  Norvir 100 Mg Tabs (Ritonavir) .... Take 1 tablet by mouth once a day 6)  Marinol 10 Mg Caps (Dronabinol) .... Take 1 tablet by mouth once a day 7)  Pepcid 20 Mg Tabs (Famotidine) .[name].. 1 tab by mouth at night 8)  Aranesp (albumin Free) 150 Mcg/0.59ml Soln (Darbepoetin alfa-polysorbate) .... Use weekly for epogen 20000 units. dialysis nurse to administer 9)  Aspirin 81 Mg Tbec (Aspirin) .... One tab by mouth daily 10)  Venofer 20 Mg/ml Soln (Iron sucrose) .... 100mg  iv on tuesday hd (hd rn to dispense) 11)  Zemplar 5 Mcg/ml Soln (Paricalcitol) .... 4 micrograms iv - dispensed by dialysis nurse 12)  Nephro-vite 0.8 Mg Tabs (B complex-c-folic acid) .... One tab by mouth once nightly 13)  B Complex Tabs (B complex vitamins) .... Take 1 tablet by mouth two times a day 14)  Anti-hist 25 Mg Caps (Diphenhydramine hcl) .... Take 1 tablet by mouth four times a day as needed for leg cramps 15)  Carvedilol 3.125 Mg Tabs (Carvedilol) .... Take 1 tablet by mouth two times a day  Other Orders: T-Hemoccult Card-Multiple (take home) ([id number]) Mammogram (Screening) (Mammo)  Patient Instructions: 1)  Please schedule a follow-up appointment in 1 month. 2)  Check your Blood Pressure regularly. If it is above: 160/100 you should make an appointment. 3)  I have started you on 2 new meds coreg for BP and Vit b12 for leg cramps. 4)  You can also take benadryl for leg cramps as needed. Prescriptions: CARVEDILOL 3.125 MG TABS (CARVEDILOL) Take 1 tablet by mouth two times a day  #62 x 1   Entered and Authorized  by:   Janell Quiet MD   Signed by:   Janell Quiet MD on 09/03/2009   Method used:   Electronically to        [id number] (retail)       [address]       [place], [place]  [address]  Ph: [phone]       Fax: [phone]   RxIDFS:[id number] ANTI-HIST 25 MG CAPS (DIPHENHYDRAMINE HCL) Take 1 tablet by mouth four times a day as needed for leg cramps  #30 x 1   Entered and Authorized by:   Janell Quiet MD   Signed by:   Janell Quiet MD on 09/03/2009   Method used:   Electronically to        [facility] (retail)       [place], [place]  [address]       Ph: [phone]       Fax: [phone]   RxIDRA:[id number] B COMPLEX  TABS (B COMPLEX VITAMINS) Take 1 tablet by mouth two times a day  #62 x 11   Entered and Authorized by:   Janell Quiet MD   Signed by:   Janell Quiet MD on 09/03/2009   Method used:   Electronically to        [facility] (retail)       [address] [place], [place]  [address]       Ph: [phone]       Fax: [phone]   RxID:   [id number]  Process Orders Check Orders Results:     [facility]: ABN not required for this insurance Tests Sent for requisitioning (September 06, 2009 2:42 PM):     09/03/2009: [facility] -- T-Basic Metabolic Panel 0000000 (signed)     Prevention & Chronic Care Immunizations   Influenza vaccine: Fluvax 3+  (10/18/2008)   Influenza vaccine deferral: Refused  (09/03/2009)    Tetanus booster: Not documented   Td booster deferral: Deferred  (08/03/2009)    Pneumococcal vaccine: Pneumovax  (04/26/2007)  Colorectal Screening   Hemoccult: Not documented   Hemoccult action/deferral: Ordered  (09/03/2009)    Colonoscopy: Not documented   Colonoscopy action/deferral: Deferred  (09/03/2009)  Other Screening   Pap smear: NEGATIVE FOR  INTRAEPITHELIAL LESIONS OR MALIGNANCY. BENIGN REACTIVE/REPARATIVE CHANGES.  (06/22/2009)    Mammogram: Not documented   Mammogram action/deferral: Ordered  (09/03/2009)   Smoking status: quit  (08/03/2009)  Lipids   Total Cholesterol: 117  (01/10/2009)   LDL: 76  (01/10/2009)   LDL Direct: Not documented   HDL: 22  (01/10/2009)   Triglycerides: 96  (01/10/2009)  Hypertension   Last Blood Pressure: 144 / 85  (09/03/2009)   Serum creatinine: 4.89  (01/10/2009)   BMP action: Ordered   Serum potassium 3.2  (01/10/2009)    Hypertension flowsheet reviewed?: Yes   Progress toward BP goal: Deteriorated  Self-Management Support :   Personal Goals (by the next clinic visit) :      Personal blood pressure goal: 140/90  (08/03/2009)   Patient will work on the following items until the next clinic visit to reach self-care goals:     Medications and monitoring: take my medicines every day  (09/03/2009)     Eating: use fresh or frozen vegetables, eat foods that are low in salt, eat baked foods instead of fried foods  (08/03/2009)     Activity: take a 30 minute walk every day  (09/03/2009)    Hypertension self-management support: Pre-printed educational material, Resources for patients handout, Written self-care plan  (09/03/2009)  Hypertension self-care plan printed.      Resource handout printed.   Nursing Instructions: Provide Hemoccult cards with instructions (see order) Schedule screening mammogram (see order)    Process Orders Check Orders Results:     [facility]: [id number] not required for this insurance Tests Sent for requisitioning (September 06, 2009 2:42 PM):     09/03/2009: [facility] -- T-Basic Metabolic Panel 0000000 (signed)

## 2010-02-07 NOTE — Assessment & Plan Note (Signed)
Summary: EST-CK/FU/MED WITH IM/ALSO ID PT/CFB   Vital Signs:  Patient profile:   55 year old female Menstrual status:  postmenopausal Height:      64 inches (162.56 cm) Weight:      167.7 pounds (76.23 kg) BMI:     28.89 Temp:     98.5 degrees F (36.94 degrees C) oral Pulse rate:   87 / minute BP sitting:   142 / 80  (left arm) Cuff size:   regular  Vitals Entered By: Mateo Flow Deborra Medina) (December 17, 2009 2:30 PM) CC: HFU-no active complaints Is Patient Diabetic? No Pain Assessment Patient in pain? no      Nutritional Status BMI of 25 - 29 = overweight  Have you ever been in a relationship where you felt threatened, hurt or afraid?No   Does patient need assistance? Functional Status Self care Ambulation Normal   Primary Care Provider:  Janell Quiet MD  CC:  HFU-no active complaints.  History of Present Illness: Patient is 55 year old womenen with PMH as described in EMR is here today for hospital follow up as she was discharged recently from the hospiatl for cellulitis.  She is compliant with her HD tuesday/thur/sat. She completed her course of antibiotic for left leg cellulitis at HD center. Her left leg cellulitis is completely healed.  No fever, chills, nausea, vomiting or change in weight or appetite reported. She does not report change in bowel habits and is good with all her refills.  Bp is elevated today. She was hypotensive during hospitalization.  HIV: Patient hasnt seen her HIV doctor since June this year and her CD4 count has been dropping gradually. It was 100 during the latest hospitalizatiopn. There is a question of medication complaince. I will encourage her to follow up with ID.     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 1 Tab Every Four Hours As Needed For Pain 2)  Epivir 10 Mg/ml Soln (Lamivudine) .... Take 1 Teaspoon (50mg ) By Mouth Every Day 3)  Zerit 1 Mg/ml Solr (Stavudine) .... Take 4 Teaspoons (20 Ml) By Mouth Every Day. 4)  Reyataz 300 Mg Caps (Atazanavir Sulfate) .... Take 1 Capsule By Mouth Once A Day 5)  Norvir 100 Mg Tabs (Ritonavir) .... Take 1 Tablet By Mouth Once A Day 6)  Marinol 10 Mg Caps (Dronabinol) .... Take 1 Tablet By Mouth Once A Day 7)  Aranesp (Albumin Free) 150 Mcg/0.1ml Soln (Darbepoetin Alfa-Polysorbate) .... Use Weekly For Epogen 20000 Units. Dialysis Nurse To Administer 8)  Aspirin 81 Mg Tbec (Aspirin) .... One Tab By Mouth Daily 9)  Venofer 20 Mg/ml Soln (Iron Sucrose) .... 100mg  Iv On Tuesday Hd (Hd Rn To Dispense) 10)  Zemplar 5 Mcg/ml Soln (Paricalcitol) .... 4 Micrograms Iv -  Dispensed By Dialysis Nurse 11)  Nephro-Vite 0.8 Mg Tabs (B Complex-C-Folic Acid) .... One Tab By Mouth Once Nightly 12)  B Complex  Tabs (B Complex Vitamins) .... Take 1 Tablet By Mouth Two Times A Day 13)  Ensure  Liqd (Nutritional Supplements) .... Take 1 Supplement By Mouth Three Times A Day With Meals 14)  Bactrim Ds 800-160 Mg Tabs (Sulfamethoxazole-Trimethoprim) .... Take 1 Tablet By Mouth Every Monday, Wednesday, and Friday. 15)  Cefazolin Sodium 1 Gm Solr (Cefazolin Sodium) .... Infuse 1 Gram Iv After Dialysis.  Last Dose On 11/27/09  Current Medications (verified): 1)  Acetaminophen 650 Mg Cr-Tabs (Acetaminophen)  .[name].. 1 Tab Every Four Hours As Needed For Pain 2)  Epivir 10 Mg/ml Soln (Lamivudine) .... Take 1 Teaspoon (50mg ) By Mouth Every Day 3)  Zerit 1 Mg/ml Solr (Stavudine) .... Take 4 Teaspoons (20 Ml) By Mouth Every Day. 4)  Reyataz 300 Mg Caps (Atazanavir Sulfate) .... Take 1 Capsule By Mouth Once A Day 5)  Norvir 100 Mg Tabs (Ritonavir) .... Take 1 Tablet By Mouth Once A Day 6)  Marinol 10 Mg Caps (Dronabinol) .... Take 1 Tablet By Mouth Once A Day 7)  Aranesp (Albumin Free) 150 Mcg/0.54ml Soln (Darbepoetin Alfa-Polysorbate) .... Use Weekly For Epogen 20000 Units. Dialysis Nurse To Administer 8)  Aspirin 81 Mg Tbec (Aspirin) .... One Tab By Mouth Daily 9)  Venofer 20 Mg/ml Soln (Iron Sucrose) .... 100mg  Iv On Tuesday Hd (Hd Rn To Dispense) 10)  Zemplar 5 Mcg/ml Soln (Paricalcitol) .... 4 Micrograms Iv - Dispensed By Dialysis Nurse 11)  Nephro-Vite 0.8 Mg Tabs (B Complex-C-Folic Acid) .... One Tab By Mouth Once Nightly 12)  B Complex  Tabs (B Complex Vitamins) .... Take 1 Tablet By Mouth Two Times A Day 13)  Ensure  Liqd (Nutritional Supplements) .... Take 1 Supplement By Mouth Three Times A Day With Meals 14)  Bactrim Ds 800-160 Mg Tabs (Sulfamethoxazole-Trimethoprim) .... Take 1 Tablet By Mouth Every Monday, Wednesday, and Friday.  Allergies (verified): No Known Drug Allergies  Past History:  Past Medical History: Last updated: 06/06/2009 HIV disease   [place] 01-2009 K103N, R5 virus.  Hypertension Renal failure  Family History: Last updated: 04/26/2007 Family History of Alcoholism/Addiction Family History Diabetes 1st degree relative  Social History: Last updated: 01/10/2009 Never Smoked Alcohol use-no Drug use-no current partner of many years, ? fidelity lives with daughter (does not know Dx).  oldest daughter does not dx.   Risk Factors: Alcohol Use: 0 (12/17/2009) Caffeine Use: soda and tea (08/03/2009) Exercise: no (08/03/2009)  Risk Factors: Smoking Status: quit  (12/17/2009) Passive Smoke Exposure: yes (12/17/2009)  Family History: Reviewed history from 04/26/2007 and no changes required. Family History of Alcoholism/Addiction Family History Diabetes 1st degree relative  Social History: Reviewed history from 01/10/2009 and no changes required. Never Smoked Alcohol use-no Drug use-no current partner of many years, ? fidelity lives with daughter (does not know Dx).  oldest daughter does not dx.   Review of Systems      See HPI  Physical Exam  Additional Exam:  Gen: AOx3, in no acute distress Eyes: PERRL, EOMI ENT:MMM, No erythema noted in posterior pharynx Neck: No JVD, No LAP Chest: CTAB with  good respiratory effort CVS: regular rhythmic rate, NO M/R/G, S1 S2 normal Abdo: soft,ND, BS+x4, Non tender and No hepatosplenomegaly EXT: chronic edematous legs b/l, no erythema noted over the skin Neuro: Non focal, gait is normal Skin: no rashes noted.    Impression & Recommendations:  Problem # 1:  CELLULITIS, THIGH, RIGHT (ICD-682.6) Assessment Improved Completely resolved with off meds now.  The following medications were removed from the medication list:    Cefazolin Sodium 1 Gm Solr (Cefazolin sodium) ..... Infuse 1 gram iv after dialysis.  last dose on 11/27/09 Her updated medication list for this problem includes:    Bactrim Ds 800-160 Mg Tabs (Sulfamethoxazole-trimethoprim) .[name]... Take 1 tablet by mouth every monday, wednesday, and friday.  Problem # 2:  HYPERTENSION, MILD (ICD-401.1) Assessment: Unchanged Will not start on nay meds at this time as she is known to be hypotensive during hospitalizations and possibly after dialysis. If remains elevated, will start with norvasc.  BP today: 142/80 Prior BP: 144/85 (09/03/2009)  Labs Reviewed: K+: 4.3 (09/03/2009) Creat: : 5.26 (09/03/2009)   Chol: 117 (01/10/2009)   HDL: 22 (01/10/2009)   LDL: 76 (01/10/2009)   TG: 96 (01/10/2009)  Problem # 3:  SPECIAL SCREENING FOR MALIGNANT  NEOPLASMS COLON (ICD-V76.51) Screening colonoscopy referral today.  Orders: Gastroenterology Referral (GI)  Problem # 4:  THROMBOCYTOPENIA (ICD-287.5) Assessment: Unchanged Unchanged and chronic. Most likely 2/2 HIV meds vs BM suppression 2/2 HIV.  Problem # 5:  HIV INFECTION (ICD-042) Assessment: Deteriorated Last CD4 count from the hospital stay was 100 and she has been on prophylaxis for PCP. she hasnt seen her ID physicain in months now other then in the hospital. I will ask for a visit to her ID physician in coming days and enmcouraged her to follow with them closely.  Problem # 6:  Preventive Health Care (ICD-V70.0) Assessment: Comment Only Mammograma nd colonoscopy referral done today.  Complete Medication List: 1)  Acetaminophen 650 Mg Cr-tabs (Acetaminophen) .[name].. 1 tab every four hours as needed for pain 2)  Epivir 10 Mg/ml Soln (Lamivudine) .... Take 1 teaspoon (50mg ) by mouth every day 3)  Zerit 1 Mg/ml Solr (Stavudine) .... Take 4 teaspoons (20 ml) by mouth every day. 4)  Reyataz 300 Mg Caps (Atazanavir sulfate) .... Take 1 capsule by mouth once a day 5)  Norvir 100 Mg Tabs (Ritonavir) .... Take 1 tablet by mouth once a day 6)  Marinol 10 Mg Caps (Dronabinol) .... Take 1 tablet by mouth once a day 7)  Aranesp (albumin Free) 150 Mcg/0.1ml Soln (Darbepoetin alfa-polysorbate) .... Use weekly for epogen 20000 units. dialysis nurse to administer 8)  Aspirin 81 Mg Tbec (Aspirin) .... One tab by mouth daily 9)  Venofer 20 Mg/ml Soln (Iron sucrose) .... 100mg  iv on tuesday hd (hd rn to dispense) 10)  Zemplar 5 Mcg/ml Soln (Paricalcitol) .... 4 micrograms iv - dispensed by dialysis nurse 11)  Nephro-vite 0.8 Mg Tabs (B complex-c-folic acid) .... One tab by mouth once nightly 12)  B Complex Tabs (B complex vitamins) .... Take 1 tablet by mouth two times a day 13)  Ensure Liqd (Nutritional supplements) .... Take 1 supplement by mouth three times a day with meals 14)  Bactrim Ds  800-160 Mg Tabs (Sulfamethoxazole-trimethoprim) .... Take 1 tablet by mouth every monday, wednesday, and friday.  Other Orders: Mammogram (Screening) (Mammo)  Patient Instructions: 1)  Please schedule a follow-up appointment in 6 months. 2)  Schedule a colonoscopy/sigmoidoscopy to help detect colon cancer.   Orders Added: 1)  Gastroenterology Referral [GI] 2)  Est. Patient Level III [id number] 3)  Mammogram (Screening) [Mammo]   Immunization History:  Influenza Immunization History:    Influenza:  historical (11/06/2009)   Immunization History:  Influenza Immunization History:    Influenza:  Historical (11/06/2009)  Prevention & Chronic Care Immunizations   Influenza vaccine: Historical  (11/06/2009)   Influenza vaccine deferral: Refused  (09/03/2009)    Tetanus booster: Not documented   Td booster deferral: Refused  (12/17/2009)    Pneumococcal vaccine: Pneumovax  (04/26/2007)  Colorectal Screening   Hemoccult: Not documented   Hemoccult action/deferral: Ordered  (09/03/2009)    Colonoscopy: Not documented   Colonoscopy action/deferral: GI referral  (12/17/2009)  Other Screening   Pap smear: NEGATIVE FOR INTRAEPITHELIAL LESIONS OR MALIGNANCY. BENIGN REACTIVE/REPARATIVE CHANGES.  (06/22/2009)    Mammogram: Not documented   Mammogram action/deferral: Ordered  (12/17/2009)   Smoking status: quit  (12/17/2009)  Lipids   Total Cholesterol: 117  (01/10/2009)   LDL: 76  (01/10/2009)   LDL Direct: Not documented   HDL: 22  (01/10/2009)   Triglycerides: 96  (01/10/2009)  Hypertension   Last Blood Pressure: 142 / 80  (12/17/2009)   Serum creatinine: 5.26  (09/03/2009)   BMP action: Ordered   Serum potassium 4.3  (09/03/2009)  Self-Management Support :   Personal Goals (by the next clinic visit) :      Personal blood pressure goal: 140/90  (08/03/2009)   Patient will work on the following items until the next clinic visit to reach self-care goals:      Medications and monitoring: take my medicines every day  (12/17/2009)     Eating: eat foods that are low in salt, eat baked foods instead of fried foods  (12/17/2009)     Activity: take a 30 minute walk every day  (09/03/2009)    Hypertension self-management support: Written self-care plan  (12/17/2009)   Hypertension self-care plan printed.   Nursing Instructions: Schedule screening mammogram (see order) GI referral for screening colonoscopy (see order)

## 2010-02-07 NOTE — Discharge Summary (Signed)
Summary: Hospital Discharge Update    Hospital Discharge Update:  Date of Admission: 07/28/2009 Date of Discharge: 07/30/2009  Brief Summary:  -Admitted for bleeding R. AV graft.  S/p thrombectomy and graft revision. 35% Hb drop. Transfused 2 units PRBCs -2/2 blood cx positive for MSSA bacteremia. Discharged on Cefazolin 2gm IV  to be administered during dialysis (TRS) for a 4 week duration. -SBPs in the 80s-90s-baseline for her and asymptomatic, but d/ced her Coreg and ACEI. Has healing ulcer on right leg, wound care in place   Labs needed at follow-up: CBC with differential  Other follow-up issues:  Follow CBC. Wound care  Medication list changes:  Added new medication of * CEFAZOLIN 2GM IV To be administered during dialysis on TRS schedule Removed medication of COREG 3.125 MG TABS (CARVEDILOL) One tablet by mouth twice daily Removed medication of LISINOPRIL 2.5 MG TABS (LISINOPRIL) One tab my mouth once nightly  Discharge medications:  ACETAMINOPHEN 650 MG CR-TABS (ACETAMINOPHEN) 1 tab every four hours as needed for pain EPIVIR 10 MG/ML SOLN (LAMIVUDINE) Take 1 teaspoon (50mg ) by mouth every day ZERIT 1 MG/ML SOLR (STAVUDINE) Take 4 teaspoons (20 mL) by mouth every day. REYATAZ 300 MG CAPS (ATAZANAVIR SULFATE) Take 1 capsule by mouth once a day NORVIR 100 MG TABS (RITONAVIR) Take 1 tablet by mouth once a day MARINOL 10 MG CAPS (DRONABINOL) Take 1 tablet by mouth once a day PEPCID 20 MG TABS (FAMOTIDINE) 1 tab by mouth at night ARANESP (ALBUMIN FREE) 150 MCG/0.3ML SOLN (DARBEPOETIN ALFA-POLYSORBATE) Use WEEKLY for Epogen 20000 units. Dialysis nurse to administer ASPIRIN 81 MG TBEC (ASPIRIN) one tab by mouth daily VENOFER 20 MG/ML SOLN (IRON SUCROSE) 100mg  IV on Tuesday HD (HD RN to dispense) ZEMPLAR 5 MCG/ML SOLN (PARICALCITOL) 4 micrograms IV - dispensed by dialysis nurse NEPHRO-VITE 0.8 MG TABS (B COMPLEX-C-FOLIC ACID) one tab by mouth once nightly * CEFAZOLIN 2GM IV To be  administered during dialysis on TRS schedule  Other patient instructions:  Pls report any chest pain, trouble breathing, fever to 101 or greater to your nearest ER. Pls also call if you have any problems with your dialysis graft. Follow up with your PCP as specified.   Note: Hospital Discharge Medications & Other Instructions handout was printed, one copy for patient and a second copy to be placed in hospital chart.

## 2010-02-07 NOTE — Discharge Summary (Signed)
Summary: Hospital Discharge Update    Hospital Discharge Update:  Date of Admission: 11/13/2009 Date of Discharge: 11/16/2009  Brief Summary:  Patient is a 55 year old woman with a PMH of ESRD and 042 who presented to the ED complaining of right leg pain for the last 2 days and fever and redness in the right leg for 2-3 weeks.  She was also complaining of chest pain that usually started with dialysis.  The chest pain was left sided, at rest, with no radiation.  She denies SOB or exertional pain.  She denies cough, nausea, vomitting, abdominal pain, urinary symptoms, headache, or weight loss.  She was admitted and placed on Vancomycin for cellulitis.  The morning after admission Ancef was added to broaden her coverage.  Blood cultures were drawn as well. She was kept on her dialysis schedule of Tuesday, Thursday, and Saturday.  The day following admission 1 of 2 blood cultures grew group B strep.  Vancomycin was discontinued and Ancef will be continued for a total of 14 days of treatment with the last dose to be given with dialysis on 11/27/09.  During her stay her CD4 level and Viral load were checked.  CD 4 was 100 and VL was 252,000 which is about the same as the last time they were checked.  She is still having trouble with compliance with her medications because of the fear that her daughter will catch her taking her HIV medications.  She will be called with her follow up appointment with Dr. Cathren Laine her PCP and with Dr. Johnnye Sima for her HIV.   Lab or other results pending at discharge:  None  Problem list changes:  Removed problem of CANDIDIASIS, ORAL (ICD-112.0) Removed problem of WOUND, ARM (ICD-884.0)  Medication list changes:  Added new medication of CEFAZOLIN SODIUM 1 GM SOLR (CEFAZOLIN SODIUM) Infuse 1 gram IV after dialysis.  Last dose on 11/27/09  The medication, problem, and allergy lists have been updated.  Please see the dictated discharge summary for details.  Discharge  medications:  ACETAMINOPHEN 650 MG CR-TABS (ACETAMINOPHEN) 1 tab every four hours as needed for pain EPIVIR 10 MG/ML SOLN (LAMIVUDINE) Take 1 teaspoon (50mg ) by mouth every day ZERIT 1 MG/ML SOLR (STAVUDINE) Take 4 teaspoons (20 mL) by mouth every day. REYATAZ 300 MG CAPS (ATAZANAVIR SULFATE) Take 1 capsule by mouth once a day NORVIR 100 MG TABS (RITONAVIR) Take 1 tablet by mouth once a day MARINOL 10 MG CAPS (DRONABINOL) Take 1 tablet by mouth once a day ARANESP (ALBUMIN FREE) 150 MCG/0.3ML SOLN (DARBEPOETIN ALFA-POLYSORBATE) Use WEEKLY for Epogen 20000 units. Dialysis nurse to administer ASPIRIN 81 MG TBEC (ASPIRIN) one tab by mouth daily VENOFER 20 MG/ML SOLN (IRON SUCROSE) 100mg  IV on Tuesday HD (HD RN to dispense) ZEMPLAR 5 MCG/ML SOLN (PARICALCITOL) 4 micrograms IV - dispensed by dialysis nurse NEPHRO-VITE 0.8 MG TABS (B COMPLEX-C-FOLIC ACID) one tab by mouth once nightly B COMPLEX  TABS (B COMPLEX VITAMINS) Take 1 tablet by mouth two times a day ENSURE  LIQD (NUTRITIONAL SUPPLEMENTS) Take 1 supplement by mouth three times a day with meals BACTRIM DS 800-160 MG TABS (SULFAMETHOXAZOLE-TRIMETHOPRIM) Take 1 tablet by mouth every Monday, Wednesday, and Friday. CEFAZOLIN SODIUM 1 GM SOLR (CEFAZOLIN SODIUM) Infuse 1 gram IV after dialysis.  Last dose on 11/27/09  Other patient instructions:  Continue with all your current medications as prescribed. A pill box would be helpful in organizing your medications. Continue with Dialysis Tuesday, Thursday, and Saturday as directed by  your kidney doctor.   The Internal Medicine clinic will call you with your follow up appointments. If you have any questions please call [phone]  Note: Hospital Discharge Medications & Other Instructions handout was printed, one copy for patient and a second copy to be placed in hospital chart.

## 2010-02-07 NOTE — Discharge Summary (Signed)
Summary: Hospital Discharge Update(cellulitis)    Hospital Discharge Update:  Date of Admission: 06/01/2009 Date of Discharge: 06/05/2009  Brief Summary:  Right thigh cellulitis- resolving with iv vancomycin- will get 2 more weeks of vanc with HD  Lab or other results pending at discharge:  blood CX  Other follow-up issues:  Resolution of cellutlits, need for furhter by mouth/iv antibioitic Was on norvasc before but apparently she has not been taking it abd BP controlled.  so its removed from the list. Restart if needed.  Problem list changes:  Changed problem from [place], [place] (ICD-682.2) to CELLULITIS, THIGH, RIGHT (ICD-682.6) - Signed Added new problem of HYPERTENSION, MILD (ICD-401.1)  Medication list changes:  Added new medication of VANCOMYCIN HCL 750 MG SOLR (VANCOMYCIN HCL) IV, Tuesday, Thursday, saturday with HD for 2 weeks - Signed Added new medication of MUPIROCIN 2 % OINT (MUPIROCIN) twice a day on the wounds on your leg  and in both nostrils for 2 weeks - Signed Added new medication of HIBICLENS 4 % LIQD (CHLORHEXIDINE GLUCONATE) use this solution to wipe on your whole body using a cloth once a day for a month. - Signed Removed medication of HYDROCODONE-ACETAMINOPHEN 5-500 MG TABS (HYDROCODONE-ACETAMINOPHEN) Take 1-2 tablets by mouth four times a day as needed for pain Added new medication of ACETAMINOPHEN 650 MG CR-TABS (ACETAMINOPHEN) 1 tab every four hours as needed for pain - Signed Removed medication of NORVASC 5 MG TABS (AMLODIPINE BESYLATE) Take 1 tablet by mouth once a day Rx of HIBICLENS 4 % LIQD (CHLORHEXIDINE GLUCONATE) use this solution to wipe on your whole body using a cloth once a day for a month.;  #1 x 3;  Signed;  Entered by: Lester [place] MD;  Authorized by: Lester [place] MD;  Method used: Print then Give to Patient Rx of MUPIROCIN 2 % OINT (MUPIROCIN) twice a day on the wounds on your leg  and in both nostrils for 2 weeks;  #1 x 2;  Signed;   Entered by: Lester [place] MD;  Authorized by: Lester [place] MD;  Method used: Print then Give to Patient Rx of ACETAMINOPHEN 650 MG CR-TABS (ACETAMINOPHEN) 1 tab every four hours as needed for pain;  #30 x 0;  Signed;  Entered by: Lester [place] MD;  Authorized by: Lester [place] MD;  Method used: Print then Give to Patient  The medication, problem, and allergy lists have been updated.  Please see the dictated discharge summary for details.  Discharge medications:  VANCOMYCIN HCL 750 MG SOLR (VANCOMYCIN HCL) IV, Tuesday, Thursday, saturday with HD for 2 weeks MUPIROCIN 2 % OINT (MUPIROCIN) twice a day on the wounds on your leg  and in both nostrils for 2 weeks HIBICLENS 4 % LIQD (CHLORHEXIDINE GLUCONATE) use this solution to wipe on your whole body using a cloth once a day for a month. ACETAMINOPHEN 650 MG CR-TABS (ACETAMINOPHEN) 1 tab every four hours as needed for pain  Other patient instructions:  Your next appointment with Dr. Johnnye Sima is on Wednesday 06/06/2009 at 9 AM.  Continue getting your hemodialyisis as per your previous schedule.    Note: Hospital Discharge Medications & Other Instructions handout was printed, one copy for patient and a second copy to be placed in hospital chart.  Prescriptions: ACETAMINOPHEN 650 MG CR-TABS (ACETAMINOPHEN) 1 tab every four hours as needed for pain  #30 x 0   Entered and Authorized by:   Lester [place] MD   Signed by:   Lester [place] MD on 06/05/2009  Method used:   Print then Give to Patient   RxID:   [id number] MUPIROCIN 2 % OINT (MUPIROCIN) twice a day on the wounds on your leg  and in both nostrils for 2 weeks  #1 x 2   Entered and Authorized by:   Lester [place] MD   Signed by:   Lester [place] MD on 06/05/2009   Method used:   Print then Give to Patient   RxIDZI:[id number] HIBICLENS 4 % LIQD (CHLORHEXIDINE GLUCONATE) use this solution to wipe on your whole body using a cloth once a day for a month.  #1 x 3   Entered and Authorized  by:   Lester [place] MD   Signed by:   Lester [place] MD on 06/05/2009   Method used:   Print then Give to Patient   RxID:   [id number]

## 2010-02-07 NOTE — Assessment & Plan Note (Signed)
Summary: 3WK F/U/VS   CC:  3 week follow up.  History of Present Illness: 55 yo F with HIV+, ESRD, HTN,  hospitalized on April 11 with abdominal wall cellulitis and a vulvar abscess.  One of two blood cultures grew coagulase negative staph that was felt to be an insignificant contaminant.  She improved with empiric IV vancomycin and was discharged home on April 14 on oral doxycycline. She returned to hospital 5-27 -> 5-31 with cellulitis of the R thigh. SHe was treated with vanco at HD in hospital and imrpved. she was d/c from the hospital this AM. CD4 320 in hospital. She was cont on her EFV/D4T/3TC in hospital. [facility] was switched to ATVr/D4T/3TC at f/u 06-06-09. Her previous cellulitis has resolved. she cont to f/u with HD. Has some bleeding from fistula site yesterday after HD. Has had no dizzyness or lightheadedness, no f/c. asks about having her anti-htn med restarted.   Current Medications (verified): 1)  Acetaminophen 650 Mg Cr-Tabs (Acetaminophen) .[name].. 1 Tab Every Four Hours As Needed For Pain 2)  Epivir 10 Mg/ml Soln (Lamivudine) .... Take 1 Teaspoon (50mg ) By Mouth Every Day 3)  Zerit 1 Mg/ml Solr (Stavudine) .... Take 4 Teaspoons (20 Ml) By Mouth Every Day. 4)  Reyataz 300 Mg Caps (Atazanavir Sulfate) .... Take 1 Capsule By Mouth Once A Day 5)  Norvir 100 Mg Tabs (Ritonavir) .... Take 1 Tablet By Mouth Once A Day  Allergies (verified): No Known Drug Allergies   Preventive Screening-Counseling & Management  Alcohol-Tobacco     Alcohol drinks/day: 0     Smoking Status: never     Passive Smoke Exposure: yes  Caffeine-Diet-Exercise     Caffeine use/day: soda and tea     Does Patient Exercise: no  Safety-Violence-Falls     Seat Belt Use: yes   Current Allergies (reviewed today): No known allergies  Review of Systems       wt up 3#.   Vital Signs:  Patient profile:   55 year old female Menstrual status:  postmenopausal Height:      64 inches (162.56 cm) Weight:       172.12 pounds (78.24 kg) BMI:     29.65 Temp:     98.2 degrees F (36.78 degrees C) oral Pulse rate:   90 / minute BP sitting:   135 / 84  (left arm)  Vitals Entered By: Rocky Morel) (June 27, 2009 9:50 AM) CC: 3 week follow up Pain Assessment Patient in pain? no      Nutritional Status BMI of 25 - 29 = overweight Nutritional Status Detail appetite is terrible per patient  Does patient need assistance? Functional Status Self care Ambulation Normal        Medication Adherence: 06/27/2009   Adherence to medications reviewed with patient. Counseling to provide adequate adherence provided   Prevention For Positives: 06/27/2009   Safe sex practices discussed with patient. Condoms offered.                             Physical Exam  General:  well-developed, well-nourished, and well-hydrated.   Eyes:  pupils equal, pupils round, and pupils reactive to light.   Mouth:  pharynx pink and moist and no exudates.   Neck:  no masses.   Lungs:  normal respiratory effort and normal breath sounds.   Heart:  normal rate, regular rhythm, and no murmur.   Abdomen:  soft, non-tender, and normal  bowel sounds.   Extremities:  RUE fistula with thrill.    Impression & Recommendations:  Problem # 1:  HIV INFECTION (ICD-042) will add marinol to try and stimulate her apetite. i went over her meds with her, she will add RTV to her regiemen. SHe is offered condoms. return to clinic 1-2 months.  The following medications were removed from the medication list:    Vancomycin Hcl 750 Mg Solr (Vancomycin hcl) ..... Iv, tuesday, thursday, saturday with hd for 2 weeks  Orders: Est. Patient Level IV [id number])  Problem # 2:  CHRONIC KIDNEY DISEASE STAGE IV (SEVERE) (ICD-585.4) she will f/u with HD, not clear she needs anti-htn rx , SBP 135 today.   Medications Added to Medication List This Visit: 1)  Marinol 10 Mg Caps (Dronabinol) .... Take 1 tablet by mouth once a  day  Prescriptions: MARINOL 10 MG CAPS (DRONABINOL) Take 1 tablet by mouth once a day  #30 x 3   Entered and Authorized by:   Bobby Rumpf MD   Signed by:   Bobby Rumpf MD on 06/27/2009   Method used:   Print then Give to Patient   RxID:   [id number] NORVIR 100 MG TABS (RITONAVIR) Take 1 tablet by mouth once a day  #90 x 3   Entered and Authorized by:   Bobby Rumpf MD   Signed by:   Bobby Rumpf MD on 06/27/2009   Method used:   Electronically to        [id number] (retail)       [address] [place], [place]  [address]       Ph: [phone]       Fax: [phone]   [id number]:[id number]

## 2010-02-07 NOTE — Letter (Signed)
Summary: Return Notice  Return Notice   Imported By: Bonner Puna 07/16/2009 15:59:09  _____________________________________________________________________  External Attachment:    Type:   Image     Comment:   External Document

## 2010-02-07 NOTE — Assessment & Plan Note (Signed)
Summary: HFU/PER DR DEVANI/OK PER DR TAMMY   History of Present Illness: [name] is seen as a hospital follow-up visit for my partner, Dr. Lita Mains.  She was hospitalized on April 11 with abdominal wall cellulitis and a vulvar abscess.  One of two blood cultures grew coagulase negative staph that was felt to be an insignificant contaminant.  She improved with empiric IV vancomycin and was discharged home on April 14 on oral doxycycline which she is due to complete tomorrow.  She does not know her complete medications and does not appear to be taking what was listed on her discharge medications she.  The only medication she has with her today he is hydrocodone/acetaminophen which was given to her in the emergency department in March.  It appears she has been out of Zerit and Sustiva for several months.  I note that Dr. Johnnye Sima question whether she was taking her medications when he last saw her in January.  She describes continuing to take her Epivir liquid.  She says that she's feeling much better and the pain in her vulva and abdominal wall have resolved.  She denies any fever.   Updated Prior Medication List: NORVASC 5 MG TABS (AMLODIPINE BESYLATE) Take 1 tablet by mouth once a day EPIVIR 10 MG/ML  SOLN (LAMIVUDINE) 64ml by mouth once daily DOXYCYCLINE HYCLATE 100 MG TABS (DOXYCYCLINE HYCLATE) Take 1 tablet by mouth two times a day for 14 days HYDROCODONE-ACETAMINOPHEN 5-500 MG TABS (HYDROCODONE-ACETAMINOPHEN) Take 1-2 tablets by mouth four times a day as needed for pain  Current Allergies (reviewed today): No known allergies  Vital Signs:  Patient profile:   55 year old female Menstrual status:  postmenopausal Height:      64 inches (162.56 cm) Weight:      176.5 pounds (80.23 kg) BMI:     30.41 Temp:     98.5 degrees F oral Pulse rate:   114 / minute BP sitting:   145 / 90  (left arm) Cuff size:   regular  Vitals Entered By: Lorne Skeens RN (May 02, 2009 3:16  PM)  Physical Exam  General:  alert and overweight-appearing.   Lungs:  normal breath sounds, no crackles, and no wheezes.   Heart:  normal rate, regular rhythm, and no murmur.   Abdomen:  soft, non-tender, normal bowel sounds, and no distention.  no obvious cellulitis or abscess.   Impression & Recommendations:  Problem # 1:  CELLULITIS, ABDOMEN (A6476059.2) Her cellulitis and abscess have resolved.  I will have her stop her doxycycline when it runs out tomorrow. Her updated medication list for this problem includes:    Doxycycline Hyclate 100 Mg Tabs (Doxycycline hyclate) .[name]... Take 1 tablet by mouth two times a day for 14 days    Hydrocodone-acetaminophen 5-500 Mg Tabs (Hydrocodone-acetaminophen) .[name]... Take 1-2 tablets by mouth four times a day as needed for pain  Orders: Est. Patient Level IV [id number])  Problem # 2:  HIV INFECTION (ICD-042) Her HIV infection is not well controlled and she is not taking her medications correctly.  Her CD4 count in January was 210 her viral load was 74,300.  A genotype resistance test at that time showed DKA 103 and mutation confirming resistance to Sustiva.  She is cc R5 positive.  I have asked her to stop her Epivir. I will have her return to see Dr. Johnnye Sima her soon after repeat blood work to consider her options for salvage regimens.  She has had intermittent thrombocytopenia which  I suspect is related to her HIV infection. Her updated medication list for this problem includes:    Doxycycline Hyclate 100 Mg Tabs (Doxycycline hyclate) .[name]... Take 1 tablet by mouth two times a day for 14 days  Orders: T-CD4SP [facility]) (CD4SP) T-HIV Viral Load [phone]) T-CBC w/Diff [phone]) Est. Patient Level IV [id number])  Diagnostics Reviewed:  HIV: HIV positive - not AIDS (10/18/2008)   CD4: 210 (01/11/2009)   WBC: 2.1 (01/10/2009)   Hgb: 10.9 (01/10/2009)   HCT: 37.6 (01/10/2009)   Platelets: * K/uL (01/10/2009) HIV genotype: * (01/10/2009)   HIV-1 RNA:  74300 (01/10/2009)   HBSAg: Negative (04/26/2007)  Medications Added to Medication List This Visit: 1)  Doxycycline Hyclate 100 Mg Tabs (Doxycycline hyclate) .... Take 1 tablet by mouth two times a day for 14 days 2)  Hydrocodone-acetaminophen 5-500 Mg Tabs (Hydrocodone-acetaminophen) .... Take 1-2 tablets by mouth four times a day as needed for pain    Patient Instructions: 1)  Please schedule a follow-up appointment in 1 month with Dr. Johnnye Sima.

## 2010-02-07 NOTE — Discharge Summary (Signed)
Summary: Hospital Discharge Update    Hospital Discharge Update:  Date of Admission: 09/24/2009 Date of Discharge: 09/27/2009  Brief Summary:  Pt is a 55 y/o woman with HIV who is non-compliant and ESRD on dialysis TTS who was admitted with abdominal pain, fever and diarrhea x1 day. CD4 count was found to have dropped to 150 (down from 320 in April 2011).  Pt was started on vanc and zosyn, then cipro and flagyl.  ID was consulted and rec'd all abx to be dc'd except diflucan for oral thrush. Pt's symptoms resolved on day one of admission, BCx 1 of 2 grew out diptheroids (likely contaminant) and CDiff by PCR was negative. Pt should follow-up at the ID clinic with Dr. Johnnye Sima on Oct 19 at 11:00am.  Also should folllow-up with PCP Dr. Cathren Laine on Oct 3 at 1:30pm.  Lab or other results pending at discharge:  AFB stool, Stool Cx, HIV RNA  Other labs needed at follow-up: none  Other follow-up issues:  Pt has severe protein calorie malnutrition which is contributing to her anasarca - she should use Ensure (or [employer] drink mix if Ensure is too expensive) and eat a regular diet.  Pt should not be on any PPI or H2 blockers (unless H2 blocker taken 12h after reyataz) because it will interfere with her ART.  Problem list changes:  Added new problem of CANDIDIASIS, ORAL (ICD-112.0)  Medication list changes:  Added new medication of FLUCONAZOLE 100 MG TABS (FLUCONAZOLE) Take 1 tablet by mouth daily for 3 days - Signed Removed medication of PEPCID 20 MG TABS (FAMOTIDINE) 1 tab by mouth at night Removed medication of CARVEDILOL 3.125 MG TABS (CARVEDILOL) Take 1 tablet by mouth two times a day Removed medication of ANTI-HIST 25 MG CAPS (DIPHENHYDRAMINE HCL) Take 1 tablet by mouth four times a day as needed for leg cramps Added new medication of ENSURE  LIQD (NUTRITIONAL SUPPLEMENTS) Take 1 supplement by mouth three times a day with meals - Signed Added new medication of BACTRIM DS 800-160 MG TABS  (SULFAMETHOXAZOLE-TRIMETHOPRIM) Take 1 tablet by mouth every Monday, Wednesday, and Friday. - Signed Rx of BACTRIM DS 800-160 MG TABS (SULFAMETHOXAZOLE-TRIMETHOPRIM) Take 1 tablet by mouth every Monday, Wednesday, and Friday.;  #15 x 5;  Signed;  Entered by: Rikki Spearing, MD;  Authorized by: Rikki Spearing, MD;  Method used: Electronically to [id number] [address] [place], [place], [place]  [address], Ph: [phone], Fax: [phone] Rx of ENSURE  LIQD (NUTRITIONAL SUPPLEMENTS) Take 1 supplement by mouth three times a day with meals;  #90 x 5;  Signed;  Entered by: Rikki Spearing, MD;  Authorized by: Rikki Spearing, MD;  Method used: Electronically to [facility]. [id number]*, [address], [place], [place], [place]  [address], Ph: [phone], Fax: [phone] Rx of FLUCONAZOLE 100 MG TABS (FLUCONAZOLE) Take 1 tablet by mouth daily for 3 days;  #3 x 0;  Signed;  Entered by: Rikki Spearing, MD;  Authorized by: Rikki Spearing, MD;  Method used: Electronically to [facility]. [facility]*, [address], [place], [place],   [address], Ph: [phone], Fax: [phone]  The medication, problem, and allergy lists have been updated.  Please see the dictated discharge summary for details.  Discharge medications:  ACETAMINOPHEN 650 MG CR-TABS (ACETAMINOPHEN) 1 tab every four hours as needed for pain EPIVIR 10 MG/ML SOLN (LAMIVUDINE) Take 1 teaspoon (50mg ) by mouth every day ZERIT 1 MG/ML SOLR (STAVUDINE) Take  4 teaspoons (20 mL) by mouth every day. REYATAZ 300 MG CAPS (ATAZANAVIR SULFATE) Take 1 capsule by mouth once a day NORVIR 100 MG TABS (RITONAVIR) Take 1 tablet by mouth once a day MARINOL 10 MG CAPS (DRONABINOL) Take 1 tablet by mouth once a day ARANESP (ALBUMIN FREE) 150 MCG/0.3ML SOLN (DARBEPOETIN ALFA-POLYSORBATE) Use WEEKLY for Epogen 20000 units. Dialysis nurse to administer ASPIRIN 81 MG TBEC (ASPIRIN) one tab by mouth daily VENOFER 20 MG/ML  SOLN (IRON SUCROSE) 100mg  IV on Tuesday HD (HD RN to dispense) ZEMPLAR 5 MCG/ML SOLN (PARICALCITOL) 4 micrograms IV - dispensed by dialysis nurse NEPHRO-VITE 0.8 MG TABS (B COMPLEX-C-FOLIC ACID) one tab by mouth once nightly B COMPLEX  TABS (B COMPLEX VITAMINS) Take 1 tablet by mouth two times a day FLUCONAZOLE 100 MG TABS (FLUCONAZOLE) Take 1 tablet by mouth daily for 3 days ENSURE  LIQD (NUTRITIONAL SUPPLEMENTS) Take 1 supplement by mouth three times a day with meals BACTRIM DS 800-160 MG TABS (SULFAMETHOXAZOLE-TRIMETHOPRIM) Take 1 tablet by mouth every Monday, Wednesday, and Friday.  Other patient instructions:  Please folllow-up with Dr. Cathren Laine on Oct 3 at 1:30pm at the outpatient medicine clinic. (phone [phone]) Please follow-up at the ID clinic with Dr. Johnnye Sima on Oct 19 at 11:00am.  (phone [phone]) Please take your medicines as directed. A pillbox would help you greatly in being compliant with all of your medicines. Please stop taking the carvedilol and the pepcid. Please take the fluconazole for 3 more days. You may resume your normal activities. You are severely malnourished. Please try to take Ensure (or [employer] mix) with every meal to try to improve your nutrition.    Note: Hospital Discharge Medications & Other Instructions handout was printed, one copy for patient and a second copy to be placed in hospital chart.

## 2010-02-07 NOTE — Assessment & Plan Note (Signed)
Summary: facial distortion/ ? tooth   CC:  facial swelling and pain in nose.  History of Present Illness: Pt c/o 1 week of swollen painful area on left side of nose.  She was given some amoxacillin which  she just started taking.  Her nose is painful as well.  No fever or chills. She has several cracked and painful teeth.  No HA.  Preventive Screening-Counseling & Management  Alcohol-Tobacco     Alcohol drinks/day: 0     Smoking Status: never     Passive Smoke Exposure: yes  Caffeine-Diet-Exercise     Caffeine use/day: soda     Does Patient Exercise: no     Times/week: 5  Hep-HIV-STD-Contraception     HIV Risk: risk noted   Updated Prior Medication List: LOPRESSOR 50 MG  TABS (METOPROLOL TARTRATE) one two times a day NU-IRON 150 MG  CAPS (POLYSACCHARIDE IRON COMPLEX) one two times a day NORVASC 10 MG  TABS (AMLODIPINE BESYLATE) one daily SUSTIVA 600 MG TABS (EFAVIRENZ) Take 1 tablet by mouth once a day EPIVIR 10 MG/ML  SOLN (LAMIVUDINE) 76ml by mouth once daily ZERIT 20 MG  CAPS (STAVUDINE) Take 1 tablet by mouth once a day AUGMENTIN 500-125 MG TABS (AMOXICILLIN-POT CLAVULANATE) one tablet every day  Current Allergies (reviewed today): No known allergies  Past History:  Past Medical History: Last updated: 04/26/2007 HIV disease Hypertension Renal failure  Review of Systems  The patient denies anorexia, fever, and headaches.    Vital Signs:  Patient profile:   55 year old female Menstrual status:  postmenopausal Height:      64 inches (162.56 cm) Weight:      174 pounds (79.09 kg) BMI:     29.97 Temp:     98.9 degrees F (37.17 degrees C) oral Pulse rate:   112 / minute BP sitting:   129 / 76  (left arm)  Vitals Entered By: Jarrett Ables CMA (January 24, 2009 10:53 AM) CC: facial swelling, pain in nose Is Patient Diabetic? No Pain Assessment Patient in pain? yes     Location: nose Intensity: 3 Type: burning Nutritional Status BMI of 25 - 29 =  overweight Nutritional Status Detail nl  Does patient need assistance? Functional Status Self care Ambulation Normal   Physical Exam  General:  alert, well-developed, well-nourished, and well-hydrated.   Head:  normocephalic and atraumatic.   Nose:  erythematous indurated wollen area just lateral to her nose on the left side - no flunctulance Mouth:  poor dentition and teeth missing.   Neck:  adenopathy present R>L   Impression & Recommendations:  Problem # 1:  ABSCESS, FACE (ICD-682.0) Will obtain a CT scan to determine the extent of the mass. Will need I & D once CT results are back.  Tx with augmentin.  Pt to go to ED if fever or chills or HA. Her updated medication list for this problem includes:    Augmentin 500-125 Mg Tabs (Amoxicillin-pot clavulanate) ..... One tablet every day  Orders: Est. Patient Level III [id number]) CT with Contrast (CT w/ contrast) Dental Referral (Dentist) Surgical Referral (Surgery)  Medications Added to Medication List This Visit: 1)  Augmentin 500-125 Mg Tabs (Amoxicillin-pot clavulanate) .... One tablet every day Prescriptions: AUGMENTIN 500-125 MG TABS (AMOXICILLIN-POT CLAVULANATE) one tablet every day  #14 x 0   Entered and Authorized by:   Aldona Bar MD   Signed by:   Aldona Bar MD on 01/24/2009   Method used:   Print then Give  to Patient   RxID:   [id number]

## 2010-02-07 NOTE — Assessment & Plan Note (Signed)
Summary: 50month f/u/vs   CC:  1 month follow up.  History of Present Illness: 55 yo F with HIV+, ESRD, HTN,  hospitalized on April 11 with abdominal wall cellulitis and a vulvar abscess.  One of two blood cultures grew coagulase negative staph that was felt to be an insignificant contaminant.  She improved with empiric IV vancomycin and was discharged home on April 14 on oral doxycycline. She returned to hospital 5-27 -> 5-31 with cellulitis of the R thigh. SHe was treated with vanco at HD in hospital and imrpved. she was d/c from the hospital this AM. CD4 320 in hospital. She was cont on her EFV/D4T/3TC in hospital .   Preventive Screening-Counseling & Management  Alcohol-Tobacco     Alcohol drinks/day: 0     Smoking Status: never     Passive Smoke Exposure: yes  Caffeine-Diet-Exercise     Caffeine use/day: soda and tea     Does Patient Exercise: no  Safety-Violence-Falls     Seat Belt Use: yes   Current Allergies: No known allergies  Past History:  Past medical, surgical, family and social histories (including risk factors) reviewed, and no changes noted (except as noted below).  Past Medical History: HIV disease   Geno 01-2009 K103N, R5 virus.  Hypertension Renal failure  Family History: Reviewed history from 04/26/2007 and no changes required. Family History of Alcoholism/Addiction Family History Diabetes 1st degree relative  Social History: Reviewed history from 01/10/2009 and no changes required. Never Smoked Alcohol use-no Drug use-no current partner of many years, ? fidelity lives with daughter (does not know Dx).  oldest daughter does not dx.   Review of Systems       her medication list is inaccurate as it is being updated by h/o at d/c. wt down 7#, massive LLE swelling, improved RLE  Vital Signs:  Patient profile:   55 year old female Menstrual status:  postmenopausal Height:      64 inches (162.56 cm) Weight:      169.12 pounds (76.87 kg) BMI:      29.13 Temp:     99.4 degrees F (37.44 degrees C) oral Pulse rate:   106 / minute BP sitting:   134 / 83  (left arm)  Vitals Entered By: Rocky Morel) (June 06, 2009 9:26 AM) CC: 1 month follow up Pain Assessment Patient in pain? no      Nutritional Status BMI of 25 - 29 = overweight Nutritional Status Detail appetite is good per patient  Does patient need assistance? Functional Status Self care Ambulation Normal   Physical Exam  General:  well-developed, well-nourished, and well-hydrated.   Eyes:  pupils equal, pupils round, and pupils reactive to light.   Mouth:  pharynx pink and moist and no exudates.   Neck:  no masses.   Lungs:  normal respiratory effort and normal breath sounds.   Heart:  normal rate, regular rhythm, and no murmur.   Abdomen:  soft, non-tender, and normal bowel sounds.   Extremities:  RUE- graft- + thrill. RLE- no increase in heat.         Medication Adherence: 06/06/2009   Adherence to medications reviewed with patient. Counseling to provide adequate adherence provided                                Impression & Recommendations:  Problem # 1:  HIV INFECTION (ICD-042)  will restart her epivir (  50mg  qd) and zerit (20mg  qd) at previous doses.  start her on reyetaz and ritonavir. have her see Verdis Frederickson for assistance.  return to clinic 2-4 weeks  Her updated medication list for this problem includes:    Vancomycin Hcl 750 Mg Solr (Vancomycin hcl) ..... Iv, tuesday, thursday, saturday with hd for 2 weeks  Orders: Est. Patient Level IV [id number])  Problem # 2:  CELLULITIS, THIGH, RIGHT (ICD-682.6) she appears improved. will cont vanco at HD for the next 2 weeks.  Her updated medication list for this problem includes:    Hydrocodone-acetaminophen 5-500 Mg Tabs (Hydrocodone-acetaminophen) .[name]... Take 1-2 tablets by mouth four times a day as needed for pain    Vancomycin Hcl 750 Mg Solr (Vancomycin hcl) ..... Iv, tuesday, thursday,  saturday with hd for 2 weeks    Acetaminophen 650 Mg Cr-tabs (Acetaminophen) .[name]... 1 tab every four hours as needed for pain  Problem # 3:  CHRONIC KIDNEY DISEASE STAGE IV (SEVERE) (ICD-585.4) greatly appreciate partnering with [facility]. she will f/u with them as well.

## 2010-02-23 ENCOUNTER — Emergency Department (HOSPITAL_COMMUNITY): Payer: Medicaid Other

## 2010-02-23 ENCOUNTER — Inpatient Hospital Stay (HOSPITAL_COMMUNITY)
Admission: EM | Admit: 2010-02-23 | Discharge: 2010-03-01 | DRG: 974 | Disposition: A | Discharge status: Home / Self Care | Payer: Medicaid Other | Attending: Internal Medicine | Admitting: Internal Medicine

## 2010-02-23 ENCOUNTER — Encounter: Payer: Self-pay | Admitting: Internal Medicine

## 2010-02-23 DIAGNOSIS — N2581 Secondary hyperparathyroidism of renal origin: Secondary | ICD-10-CM | POA: Diagnosis present

## 2010-02-23 DIAGNOSIS — A411 Sepsis due to other specified staphylococcus: Secondary | ICD-10-CM | POA: Diagnosis present

## 2010-02-23 DIAGNOSIS — D638 Anemia in other chronic diseases classified elsewhere: Secondary | ICD-10-CM | POA: Diagnosis present

## 2010-02-23 DIAGNOSIS — B20 Human immunodeficiency virus [HIV] disease: Principal | ICD-10-CM | POA: Diagnosis present

## 2010-02-23 DIAGNOSIS — Z992 Dependence on renal dialysis: Secondary | ICD-10-CM

## 2010-02-23 DIAGNOSIS — I509 Heart failure, unspecified: Secondary | ICD-10-CM | POA: Diagnosis present

## 2010-02-23 DIAGNOSIS — A419 Sepsis, unspecified organism: Secondary | ICD-10-CM | POA: Diagnosis present

## 2010-02-23 DIAGNOSIS — N186 End stage renal disease: Secondary | ICD-10-CM | POA: Diagnosis present

## 2010-02-23 DIAGNOSIS — M79609 Pain in unspecified limb: Secondary | ICD-10-CM

## 2010-02-23 DIAGNOSIS — L02419 Cutaneous abscess of limb, unspecified: Secondary | ICD-10-CM | POA: Diagnosis present

## 2010-02-23 HISTORY — DX: Essential (primary) hypertension: I10

## 2010-02-23 HISTORY — DX: Human immunodeficiency virus (HIV) disease: B20

## 2010-02-23 LAB — COMPREHENSIVE METABOLIC PANEL
ALT: 22 U/L (ref 0–35)
AST: 55 U/L — ABNORMAL HIGH (ref 0–37)
Albumin: 1.7 g/dL — ABNORMAL LOW (ref 3.5–5.2)
Alkaline Phosphatase: 64 U/L (ref 39–117)
BUN: 22 mg/dL (ref 6–23)
CO2: 28 mEq/L (ref 19–32)
Calcium: 8 mg/dL — ABNORMAL LOW (ref 8.4–10.5)
Chloride: 103 mEq/L (ref 96–112)
Creatinine, Ser: 6.93 mg/dL — ABNORMAL HIGH (ref 0.4–1.2)
GFR calc Af Amer: 8 mL/min — ABNORMAL LOW (ref >60)
GFR calc non Af Amer: 6 mL/min — ABNORMAL LOW (ref >60)
Glucose, Bld: 76 mg/dL (ref 70–99)
Potassium: 3.6 mEq/L (ref 3.5–5.1)
Sodium: 137 mEq/L (ref 135–145)
Total Bilirubin: 0.7 mg/dL (ref 0.3–1.2)
Total Protein: 9 g/dL — ABNORMAL HIGH (ref 6.0–8.3)

## 2010-02-23 LAB — POCT I-STAT, CHEM 8
BUN: 26 mg/dL — ABNORMAL HIGH (ref 6–23)
Calcium, Ion: 0.97 mmol/L — ABNORMAL LOW (ref 1.12–1.32)
Chloride: 102 mEq/L (ref 96–112)
Creatinine, Ser: 7 mg/dL — ABNORMAL HIGH (ref 0.4–1.2)
Glucose, Bld: 73 mg/dL (ref 70–99)
HCT: 30 % — ABNORMAL LOW (ref 36.0–46.0)
Hemoglobin: 10.2 g/dL — ABNORMAL LOW (ref 12.0–15.0)
Potassium: 3.5 mEq/L (ref 3.5–5.1)
Sodium: 139 mEq/L (ref 135–145)
TCO2: 29 mmol/L (ref 0–100)

## 2010-02-23 LAB — DIFFERENTIAL
Basophils Absolute: 0 10*3/uL (ref 0.0–0.1)
Basophils Relative: 0 % (ref 0–1)
Eosinophils Absolute: 0 10*3/uL (ref 0.0–0.7)
Eosinophils Relative: 0 % (ref 0–5)
Lymphocytes Relative: 18 % (ref 12–46)
Lymphs Abs: 1.2 10*3/uL (ref 0.7–4.0)
Monocytes Absolute: 0.5 10*3/uL (ref 0.1–1.0)
Monocytes Relative: 7 % (ref 3–12)
Neutro Abs: 5.2 10*3/uL (ref 1.7–7.7)
Neutrophils Relative %: 75 % (ref 43–77)
WBC Morphology: INCREASED

## 2010-02-23 LAB — CBC
HCT: 29 % — ABNORMAL LOW (ref 36.0–46.0)
Hemoglobin: 9.5 g/dL — ABNORMAL LOW (ref 12.0–15.0)
MCH: 32.6 pg (ref 26.0–34.0)
MCHC: 32.8 g/dL (ref 30.0–36.0)
MCV: 99.7 fL (ref 78.0–100.0)
Platelets: 138 10*3/uL — ABNORMAL LOW (ref 150–400)
RBC: 2.91 MIL/uL — ABNORMAL LOW (ref 3.87–5.11)
RDW: 15.5 % (ref 11.5–15.5)
WBC: 6.9 10*3/uL (ref 4.0–10.5)

## 2010-02-23 LAB — CK TOTAL AND CKMB (NOT AT ARMC)
CK, MB: 6.3 ng/mL (ref 0.3–4.0)
Relative Index: 1.1 (ref 0.0–2.5)
Total CK: 592 U/L — ABNORMAL HIGH (ref 7–177)

## 2010-02-23 LAB — TROPONIN I: Troponin I: 0.21 ng/mL — ABNORMAL HIGH (ref 0.00–0.06)

## 2010-02-23 LAB — POCT CARDIAC MARKERS
CKMB, poc: 6.3 ng/mL (ref 1.0–8.0)
Myoglobin, poc: >500 ng/mL (ref 12–200)
Troponin i, poc: <0.05 ng/mL (ref 0.00–0.09)

## 2010-02-23 LAB — PROTIME-INR
INR: 1.38 (ref 0.00–1.49)
Prothrombin Time: 17.2 seconds — ABNORMAL HIGH (ref 11.6–15.2)

## 2010-02-23 LAB — APTT: aPTT: 39 seconds — ABNORMAL HIGH (ref 24–37)

## 2010-02-24 ENCOUNTER — Inpatient Hospital Stay (HOSPITAL_COMMUNITY): Payer: Medicaid Other

## 2010-02-24 LAB — IRON AND TIBC
Iron: 14 ug/dL — ABNORMAL LOW (ref 42–135)
Saturation Ratios: 19 % — ABNORMAL LOW (ref 20–55)
TIBC: 75 ug/dL — ABNORMAL LOW (ref 250–470)
UIBC: 61 ug/dL

## 2010-02-24 LAB — RENAL FUNCTION PANEL
Albumin: 1.9 g/dL — ABNORMAL LOW (ref 3.5–5.2)
BUN: 7 mg/dL (ref 6–23)
CO2: 27 mEq/L (ref 19–32)
Calcium: 7.6 mg/dL — ABNORMAL LOW (ref 8.4–10.5)
Chloride: 101 mEq/L (ref 96–112)
Creatinine, Ser: 3.06 mg/dL — ABNORMAL HIGH (ref 0.4–1.2)
GFR calc Af Amer: 19 mL/min — ABNORMAL LOW (ref >60)
GFR calc non Af Amer: 16 mL/min — ABNORMAL LOW (ref >60)
Glucose, Bld: 91 mg/dL (ref 70–99)
Phosphorus: 1.7 mg/dL — ABNORMAL LOW (ref 2.3–4.6)
Potassium: 4.4 mEq/L (ref 3.5–5.1)
Sodium: 135 mEq/L (ref 135–145)

## 2010-02-24 LAB — CARDIAC PANEL(CRET KIN+CKTOT+MB+TROPI)
CK, MB: 4.5 ng/mL — ABNORMAL HIGH (ref 0.3–4.0)
CK, MB: 10.3 ng/mL (ref 0.3–4.0)
CK, MB: 12.8 ng/mL (ref 0.3–4.0)
Relative Index: 0.9 (ref 0.0–2.5)
Relative Index: 1.7 (ref 0.0–2.5)
Relative Index: 1.8 (ref 0.0–2.5)
Total CK: 517 U/L — ABNORMAL HIGH (ref 7–177)
Total CK: 619 U/L — ABNORMAL HIGH (ref 7–177)
Total CK: 702 U/L — ABNORMAL HIGH (ref 7–177)
Troponin I: 0.13 ng/mL — ABNORMAL HIGH (ref 0.00–0.06)
Troponin I: 0.19 ng/mL — ABNORMAL HIGH (ref 0.00–0.06)
Troponin I: 0.18 ng/mL — ABNORMAL HIGH (ref 0.00–0.06)

## 2010-02-24 LAB — CBC
HCT: 28.2 % — ABNORMAL LOW (ref 36.0–46.0)
Hemoglobin: 9.1 g/dL — ABNORMAL LOW (ref 12.0–15.0)
MCH: 32.6 pg (ref 26.0–34.0)
MCHC: 32.3 g/dL (ref 30.0–36.0)
MCV: 101.1 fL — ABNORMAL HIGH (ref 78.0–100.0)
Platelets: 140 10*3/uL — ABNORMAL LOW (ref 150–400)
RBC: 2.79 MIL/uL — ABNORMAL LOW (ref 3.87–5.11)
RDW: 16.1 % — ABNORMAL HIGH (ref 11.5–15.5)
WBC: 8.2 10*3/uL (ref 4.0–10.5)

## 2010-02-24 LAB — VITAMIN B12: Vitamin B-12: 718 pg/mL (ref 211–911)

## 2010-02-24 LAB — LIPID PANEL
Cholesterol: 80 mg/dL (ref 0–200)
HDL: <10 mg/dL — ABNORMAL LOW (ref >39)
Triglycerides: 79 mg/dL (ref <150)
VLDL: 16 mg/dL (ref 0–40)

## 2010-02-24 LAB — FOLATE: Folate: 9 ng/mL

## 2010-02-24 LAB — POTASSIUM: Potassium: 4.4 mEq/L (ref 3.5–5.1)

## 2010-02-24 LAB — FERRITIN: Ferritin: 1091 ng/mL — ABNORMAL HIGH (ref 10–291)

## 2010-02-24 LAB — MRSA PCR SCREENING: MRSA by PCR: NEGATIVE

## 2010-02-25 ENCOUNTER — Inpatient Hospital Stay (HOSPITAL_COMMUNITY): Payer: Medicaid Other

## 2010-02-25 ENCOUNTER — Encounter (HOSPITAL_COMMUNITY): Payer: Self-pay

## 2010-02-25 DIAGNOSIS — M79609 Pain in unspecified limb: Secondary | ICD-10-CM

## 2010-02-25 LAB — CBC
HCT: 26.7 % — ABNORMAL LOW (ref 36.0–46.0)
Hemoglobin: 8.6 g/dL — ABNORMAL LOW (ref 12.0–15.0)
MCH: 32.3 pg (ref 26.0–34.0)
MCHC: 32.2 g/dL (ref 30.0–36.0)
MCV: 100.4 fL — ABNORMAL HIGH (ref 78.0–100.0)
Platelets: 142 10*3/uL — ABNORMAL LOW (ref 150–400)
RBC: 2.66 MIL/uL — ABNORMAL LOW (ref 3.87–5.11)
RDW: 16.5 % — ABNORMAL HIGH (ref 11.5–15.5)
WBC: 5.9 10*3/uL (ref 4.0–10.5)

## 2010-02-25 LAB — RENAL FUNCTION PANEL
Albumin: 1.7 g/dL — ABNORMAL LOW (ref 3.5–5.2)
BUN: 21 mg/dL (ref 6–23)
CO2: 28 mEq/L (ref 19–32)
Calcium: 7.9 mg/dL — ABNORMAL LOW (ref 8.4–10.5)
Chloride: 99 mEq/L (ref 96–112)
Creatinine, Ser: 4.78 mg/dL — ABNORMAL HIGH (ref 0.4–1.2)
GFR calc Af Amer: 12 mL/min — ABNORMAL LOW (ref >60)
GFR calc non Af Amer: 10 mL/min — ABNORMAL LOW (ref >60)
Glucose, Bld: 71 mg/dL (ref 70–99)
Phosphorus: 2.8 mg/dL (ref 2.3–4.6)
Potassium: 4.3 mEq/L (ref 3.5–5.1)
Sodium: 132 mEq/L — ABNORMAL LOW (ref 135–145)

## 2010-02-25 LAB — T-HELPER CELLS (CD4) COUNT (NOT AT ARMC)
CD4 % Helper T Cell: 20 % — ABNORMAL LOW (ref 33–55)
CD4 T Cell Abs: 300 uL — ABNORMAL LOW (ref 400–2700)

## 2010-02-25 LAB — MRSA PCR SCREENING: MRSA by PCR: NEGATIVE

## 2010-02-25 MED ORDER — IOHEXOL 300 MG/ML  SOLN
100.0000 mL | Freq: Once | INTRAMUSCULAR | Status: AC | PRN
  Administered 2010-02-25: 100 mL via INTRAVENOUS

## 2010-02-26 ENCOUNTER — Inpatient Hospital Stay (HOSPITAL_COMMUNITY): Payer: Medicaid Other

## 2010-02-26 DIAGNOSIS — M79609 Pain in unspecified limb: Secondary | ICD-10-CM

## 2010-02-26 LAB — CBC
HCT: 26.4 % — ABNORMAL LOW (ref 36.0–46.0)
Hemoglobin: 8.4 g/dL — ABNORMAL LOW (ref 12.0–15.0)
MCH: 32.1 pg (ref 26.0–34.0)
MCHC: 31.8 g/dL (ref 30.0–36.0)
MCV: 100.8 fL — ABNORMAL HIGH (ref 78.0–100.0)
Platelets: 148 10*3/uL — ABNORMAL LOW (ref 150–400)
RBC: 2.62 MIL/uL — ABNORMAL LOW (ref 3.87–5.11)
RDW: 16.5 % — ABNORMAL HIGH (ref 11.5–15.5)
WBC: 3.8 10*3/uL — ABNORMAL LOW (ref 4.0–10.5)

## 2010-02-26 LAB — RENAL FUNCTION PANEL
Albumin: 1.7 g/dL — ABNORMAL LOW (ref 3.5–5.2)
BUN: 11 mg/dL (ref 6–23)
CO2: 29 mEq/L (ref 19–32)
Calcium: 7.9 mg/dL — ABNORMAL LOW (ref 8.4–10.5)
Chloride: 100 mEq/L (ref 96–112)
Creatinine, Ser: 3.99 mg/dL — ABNORMAL HIGH (ref 0.4–1.2)
GFR calc Af Amer: 14 mL/min — ABNORMAL LOW (ref >60)
GFR calc non Af Amer: 12 mL/min — ABNORMAL LOW (ref >60)
Glucose, Bld: 73 mg/dL (ref 70–99)
Phosphorus: 1.2 mg/dL — ABNORMAL LOW (ref 2.3–4.6)
Potassium: 3.5 mEq/L (ref 3.5–5.1)
Sodium: 132 mEq/L — ABNORMAL LOW (ref 135–145)

## 2010-02-26 LAB — CULTURE, BLOOD (ROUTINE X 2)
Culture  Setup Time: 201202181733
Culture  Setup Time: 201202181733

## 2010-02-26 LAB — PROTEIN ELECTROPH W RFLX QUANT IMMUNOGLOBULINS
Albumin ELP: 27.3 % — ABNORMAL LOW (ref 55.8–66.1)
Alpha-1-Globulin: 3.2 % (ref 2.9–4.9)
Alpha-2-Globulin: 6.4 % — ABNORMAL LOW (ref 7.1–11.8)
Beta 2: 18.1 % — ABNORMAL HIGH (ref 3.2–6.5)
Beta Globulin: 3.3 % — ABNORMAL LOW (ref 4.7–7.2)
Gamma Globulin: 41.7 % — ABNORMAL HIGH (ref 11.1–18.8)
M-Spike, %: 3.35 g/dL
Total Protein ELP: 8.2 g/dL (ref 6.0–8.3)

## 2010-02-26 LAB — IRON AND TIBC
Iron: 63 ug/dL (ref 42–135)
UIBC: <55 ug/dL

## 2010-02-27 DIAGNOSIS — I319 Disease of pericardium, unspecified: Secondary | ICD-10-CM

## 2010-02-27 LAB — HEMOCCULT GUIAC POC 1CARD (OFFICE): Fecal Occult Bld: NEGATIVE

## 2010-02-27 LAB — IMMUNOFIXATION ADD-ON

## 2010-02-27 LAB — HIV-1 RNA QUANT-NO REFLEX-BLD
HIV 1 RNA Quant: 894000 copies/mL — ABNORMAL HIGH (ref <20)
HIV-1 RNA Quant, Log: 5.95 {Log} — ABNORMAL HIGH (ref <1.30)

## 2010-02-27 LAB — IGG, IGA, IGM
IgA: 223 mg/dL (ref 68–378)
IgG (Immunoglobin G), Serum: 4670 mg/dL — ABNORMAL HIGH (ref 694–1618)
IgM, Serum: 1480 mg/dL — ABNORMAL HIGH (ref 60–263)

## 2010-02-27 NOTE — Miscellaneous (Signed)
Summary: Hospital Admission  INTERNAL MEDICINE ADMISSION HISTORY AND PHYSICAL  Attending: Dr. Oval Linsey  First Contact: Dr. Harlow Mares [phone] Second Contact: Dr. Vanessa Kick [id number]  PCP: Dr. Oval Linsey  CC: leg swelling  HPI: 55yo W with HIV (CD4 100, VL 252,000 -11/2009), ESRD on HD, CHF (EF 25-30%), and HTN who presented to the ED with complaints of L leg pain for the past 1-2 days. She describes the pain as deep, aching in her mid-calf down to her ankle. She says that the pain is worse with walking and standing; this pain prevented her from going to today's dialysis session. She denies any other systemic symptoms or sick contacts.  Of note, she reports stopping her HIV medications several months ago because she "felt well" and did not think she needed them anymore.  ALLERGIES: NKDA  PAST MEDICAL HISTORY: HIV disease   Geno 01-2009 K103N, R5 virus.  Hypertension Renal failure Hx of cellulitis in R lower extremity, 11/2009  MEDICATIONS: ACETAMINOPHEN 650 MG CR-TABS (ACETAMINOPHEN) 1 tab every four hours as needed for pain EPIVIR 10 MG/ML SOLN (LAMIVUDINE) Take 1 teaspoon (50mg ) by mouth every day ZERIT 1 MG/ML SOLR (STAVUDINE) Take 4 teaspoons (20 mL) by mouth every day. REYATAZ 300 MG CAPS (ATAZANAVIR SULFATE) Take 1 capsule by mouth once a day NORVIR 100 MG TABS (RITONAVIR) Take 1 tablet by mouth once a day MARINOL 10 MG CAPS (DRONABINOL) Take 1 tablet by mouth once a day ARANESP (ALBUMIN FREE) 150 MCG/0.3ML SOLN (DARBEPOETIN ALFA-POLYSORBATE) Use WEEKLY for Epogen 20000 units. Dialysis nurse to administer ASPIRIN 81 MG TBEC (ASPIRIN) one tab by mouth daily VENOFER 20 MG/ML SOLN (IRON SUCROSE) 100mg  IV on Tuesday HD (HD RN to dispense) ZEMPLAR 5 MCG/ML SOLN (PARICALCITOL) 4 micrograms IV - dispensed by dialysis nurse NEPHRO-VITE 0.8 MG TABS (B COMPLEX-C-FOLIC ACID) one tab by mouth once nightly B COMPLEX  TABS (B COMPLEX VITAMINS) Take 1 tablet by mouth two times a  day ENSURE  LIQD (NUTRITIONAL SUPPLEMENTS) Take 1 supplement by mouth three times a day with meals BACTRIM DS 800-160 MG TABS (SULFAMETHOXAZOLE-TRIMETHOPRIM) Take 1 tablet by mouth every Monday, Wednesday, and Friday.  SOCIAL HISTORY: Never Smoked Alcohol use-no Drug use-no lives with daughter (does not know Dx).  oldest daughter does not dx.   FAMILY HISTORY: Family History of Alcoholism/Addiction Family History Diabetes 1st degree relative  ROS:  Negative except per history of present illness  VITALS: T:103.5  P:108  BP:93/84  R:18  O2SAT:96%  ON:2L [place]  PHYSICAL EXAM: General:  alert, well-developed, and cooperative to examination.   Head:  normocephalic and atraumatic.   Eyes:  vision grossly intact, pupils equal, pupils round, pupils reactive to light, no injection and anicteric.   Mouth:  pharynx pink and moist, no erythema, and no exudates. Neck:  supple, full ROM, no thyromegaly, no JVD, and no carotid bruits.   Lungs:  normal respiratory effort, no accessory muscle use, normal breath sounds, no crackles, and no wheezes.  Heart:  normal rate, regular rhythm, 3/6 SEM at L and R USB without radiation, no gallop, and no rub identified.   Abdomen:  soft, non-tender, normal bowel sounds, no distention, no guarding, no rebound tenderness, no hepatomegaly, and no splenomegaly. Msk:  no joint swelling, no joint warmth, and no redness over joints.   Pulses:  2+ DP/PT pulses bilaterally Extremities:  +1 LE edema on left extremity, with hyperpigmented, hyperkeratotic, lichenified lesion on the anterior  left leg. Neurologic:  alert & oriented X3, cranial nerves  II-XII intact, strength normal in all extremities, sensation intact to light touch, and gait normal.   Skin:  turgor normal and no rashes.   Psych:  Oriented X3, memory intact for recent and remote, normally interactive, good eye contact, not anxious appearing, and not depressed  appearing  LABS:  WBC                                       6.9               4.0-10.5         K/uL  RBC                                      2.91       l      3.87-5.11        MIL/uL  Hemoglobin (HGB)                         9.5        l      12.0-15.0        g/dL  Hematocrit (HCT)                         29.0       l      36.0-46.0        %  MCV                                      99.7              78.0-100.0       fL  MCH -                                    32.6              26.0-34.0        pg  MCHC                                     32.8              30.0-36.0        g/dL  RDW                                      15.5              11.5-15.5        %  Platelet Count (PLT)                     138        l      150-400          K/uL  Neutrophils, %                           75  43-77            %  Lymphocytes, %                           18                12-46            %  Monocytes, %                             7                 3-12             %  Eosinophils, %                           0                 0-5              %  Basophils, %                             0                 0-1              %  Neutrophils, Absolute                    5.2               1.7-7.7          K/uL  Lymphocytes, Absolute                    1.2               0.7-4.0          K/uL  Monocytes, Absolute                      0.5               0.1-1.0          K/uL  Eosinophils, Absolute                    0.0               0.0-0.7          K/uL  Basophils, Absolute                      0.0               0.0-0.1          K/uL  WBC Morphology                           SEE NOTE.    INCREASED BANDS (>20% BANDS)   Sodium (NA)                              137               135-145          mEq/L  Potassium (  K)                            3.6               3.5-5.1          mEq/L  Chloride                                 103               96-112           mEq/L  CO2                                      28                19-32            mEq/L   Glucose                                  76                70-99            mg/dL  BUN                                      22                6-23             mg/dL  Creatinine                               6.93       h      0.4-1.2          mg/dL  GFR, Est Non African American            6          l      >60              mL/min  GFR, Est African American                8          l      >60              mL/min    Oversized comment, see footnote  1  Bilirubin, Total                         0.7               0.3-1.2          mg/dL  Alkaline Phosphatase                     64                39-117           U/L  SGOT (AST)  55         h      0-37             U/L  SGPT (ALT)                               22                0-35             U/L  Total  Protein                           9.0        h      6.0-8.3          g/dL  Albumin-Blood                            1.7        l      3.5-5.2          g/dL  Calcium                                  8.0        l      8.4-10.5         mg/dL   CKMB, POC                                6.3               1.0-8.0          ng/mL  Troponin I, POC                          <0.05             0.00-0.09        ng/mL  Myoglobin, POC                           >500       H      12-200           ng/mL   PTT(a-Partial Thromboplastn Time)        39         h      24-37            seconds   Protime ( Prothrombin Time)              17.2       h      11.6-15.2        seconds  INR                                      1.38              0.00-1.49    ASSESSMENT AND PLAN:  Left calf tenderness:  The pain started one day prior to admission,  skin over the left leg is warm, and the skin is different than the left leg, however it is  more consistent with chronic skin changes rather than an acute cellulitis presentation. The patient has a fever of 103.5 and  tachycardia, however she does not have an elevated white count(however this may be secondary  to HIV).    Differential diagnoses includes DVT,  cellulitis,  bony involvement,  muscular pain. plan  -IV clindamycin for presumed cellulitis  -lower extremity ultrasound to rule out DVT  -plain x-ray to rule out bony involvement  -blood cultures x2, urinalysis with culture,  2 view chest x-ray to rule out other causes for her fever  End-stage renal disease: on Tuesday, Thursday and Saturday schedule the patient was scheduled for hemodialysis today however she was unable to go to the appointment  due to feeling sick secondary to  problem #1.  Will call renal and arrange for dialysis   HIV: last CD4 count 100 11/2009 down from 320 in April 2011,  has not been taking her medication for the last 6 months as she felt she did not need them at the time. For now we'll not restart her antiretroviral therapy and check a CD4 count to assess for HIV prophylaxis needs.  we will discuss with Dr. Johnnye Sima to see if he would like to restart her antiretroviral treatment  Anemia: per records the patient has a history of macrocytic anemia most likely secondary to B12 deficiency,  now the patient's MCV is within normal limits, her hemoglobin is at baseline. we'll check anemia panel.  Thrombocytopenia:  likely secondary to her HIV,  for now her hemoglobin is stable does not appear to be bleeding, we will continue to monitor.  VTE PROPH: Lovenox     Attending Physician: I have seen and examined the patient. I reviewed the resident/fellow note and agree with the findings and plan of care as documented. My additions and revisions are included.   Signature:

## 2010-02-28 ENCOUNTER — Inpatient Hospital Stay (HOSPITAL_COMMUNITY): Payer: Medicaid Other

## 2010-02-28 ENCOUNTER — Other Ambulatory Visit: Payer: Self-pay | Admitting: Infectious Disease

## 2010-02-28 LAB — RENAL FUNCTION PANEL
Albumin: 1.7 g/dL — ABNORMAL LOW (ref 3.5–5.2)
BUN: 18 mg/dL (ref 6–23)
CO2: 28 mEq/L (ref 19–32)
Calcium: 8 mg/dL — ABNORMAL LOW (ref 8.4–10.5)
Chloride: 97 mEq/L (ref 96–112)
Creatinine, Ser: 4.23 mg/dL — ABNORMAL HIGH (ref 0.4–1.2)
GFR calc Af Amer: 13 mL/min — ABNORMAL LOW (ref >60)
GFR calc non Af Amer: 11 mL/min — ABNORMAL LOW (ref >60)
Glucose, Bld: 91 mg/dL (ref 70–99)
Phosphorus: 3.1 mg/dL (ref 2.3–4.6)
Potassium: 3.8 mEq/L (ref 3.5–5.1)
Sodium: 131 mEq/L — ABNORMAL LOW (ref 135–145)

## 2010-02-28 LAB — CBC
HCT: 26.2 % — ABNORMAL LOW (ref 36.0–46.0)
Hemoglobin: 8.6 g/dL — ABNORMAL LOW (ref 12.0–15.0)
MCH: 32.7 pg (ref 26.0–34.0)
MCHC: 32.8 g/dL (ref 30.0–36.0)
MCV: 99.6 fL (ref 78.0–100.0)
Platelets: 130 10*3/uL — ABNORMAL LOW (ref 150–400)
RBC: 2.63 MIL/uL — ABNORMAL LOW (ref 3.87–5.11)
RDW: 16.7 % — ABNORMAL HIGH (ref 11.5–15.5)
WBC: 2.6 10*3/uL — ABNORMAL LOW (ref 4.0–10.5)

## 2010-02-28 LAB — FERRITIN: Ferritin: 1268 ng/mL — ABNORMAL HIGH (ref 10–291)

## 2010-02-28 LAB — HEPATITIS B SURFACE ANTIGEN: Hepatitis B Surface Ag: NEGATIVE

## 2010-03-01 LAB — CBC
HCT: 26.1 % — ABNORMAL LOW (ref 36.0–46.0)
Hemoglobin: 8.6 g/dL — ABNORMAL LOW (ref 12.0–15.0)
MCH: 33.1 pg (ref 26.0–34.0)
MCHC: 33 g/dL (ref 30.0–36.0)
MCV: 100.4 fL — ABNORMAL HIGH (ref 78.0–100.0)
Platelets: 127 10*3/uL — ABNORMAL LOW (ref 150–400)
RBC: 2.6 MIL/uL — ABNORMAL LOW (ref 3.87–5.11)
RDW: 16.8 % — ABNORMAL HIGH (ref 11.5–15.5)
WBC: 2.2 10*3/uL — ABNORMAL LOW (ref 4.0–10.5)

## 2010-03-01 LAB — HEMOCCULT GUIAC POC 1CARD (OFFICE): Fecal Occult Bld: NEGATIVE

## 2010-03-04 LAB — CULTURE, BLOOD (ROUTINE X 2)
Culture  Setup Time: 201202210110
Culture  Setup Time: 201202210110
Culture: NO GROWTH
Culture: NO GROWTH

## 2010-03-15 NOTE — Discharge Summary (Signed)
[name], [name]                 ACCOUNT NO.:  [id number]  MEDICAL RECORD NO.:  [id number]           PATIENT TYPE:  I  LOCATION:  [id number]                         FACILITY:  [facility]  PHYSICIAN:  [name] Linsey, MD     DATE OF BIRTH:  [birth year]  DATE OF ADMISSION:  02/23/2010 DATE OF DISCHARGE:  03/01/2010                              DISCHARGE SUMMARY   DISCHARGE DIAGNOSES: 1. Methicillin-sensitive Staphylococcus aureus bacteremia. 2. Cellulitis of left lower extremity. 3. Human immunodeficiency virus infection with CD4 count 300, not     currently taking antiretroviral therapy. 4. End-stage renal disease, on hemodialysis. 5. Anemia of chronic disease. 6. Congestive heart failure with ejection fraction of 25%. 7. 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  DISCHARGE MEDICATIONS: 1. Cefazolin 2 g to be given IV with Tuesday, Thursday, Saturday     dialysis through March 21, 2010. 2. Aranesp 200 mcg IV with Saturday dialysis. 3. Acetaminophen 650 mg one tablet by mouth every 4 hours as needed     for pain. 4. Aspirin 81 mg one tablet by mouth every evening. 5. Ensure over-the-counter one bottle by mouth twice daily with meals. 6. Nephro-Vite one tablet by mouth daily. 7. Vitamin B complex one tablet by mouth daily.  DISPOSITION AND FOLLOWUP:  [name] was discharged from [facility] on March 01, 2010 in stable and improved condition.  Her left lower leg was no longer hot to the touch or causing pain.  She will complete a total of 4 weeks of intravenous antibiotics to be administered with hemodialysis for treatment of methicillin-sensitive staph aureus bacteremia.  Repeat blood cultures drawn prior to discharge were negative.  She will follow up with Dr. Johnnye Sima in the [facility] on March 18, 2010 at 4 p.m., at  which time, he will address treatment of her HIV.  She will also follow up with Dr. Cathren Laine at the [facility] on June 10, 2010 at 1:15 p.m. for routine general health management.  CONSULTATIONS:  Renal, Dr. Hassell Done.  PROCEDURES PERFORMED: 1. PA and lateral chest x-ray on February 23, 2010, which demonstrated     cardiomegaly and pulmonary vascular congestion compatible with mild-     to-moderate CHF. 2. Two-view x-ray of left tibia and fibula, which demonstrated no     acute abnormality, substantial loss of body mass compared to 2005     noted. 3. Transesophageal echocardiogram on February 27, 2010, which     demonstrated no evidence of vegetation or endocarditis.  Estimated     ejection fraction was 25%. 4. CT guided bone marrow aspirate and biopsy on February 28, 2010,     bone marrow aspirate was sent for pathologic evaluation, which     demonstrated nonspecific hypercellular bone marrow possibly related     to HIV infection.  ADMISSION HISTORY:  [name] is a 55 year old woman with HIV, last CD4 count 100 with a viral load of 252,000 in  November 2011; end-stage renal disease, on hemodialysis; congestive heart failure with an ejection fraction of 25-30% who presented to the emergency department with complaints of left lower leg pain for the prior 1-2 days.  She described the pain as deep aching in her midcalf down to her ankle.  She stated that the pain was worse with walking and standing.  This pain prevented her from going to dialysis on the day of admission.  She denied any other systemic symptoms or sick contacts.  Of note, she reports stopping her HIV medications several months ago because she "felt well" and did not think she needed them anymore.  ADMISSION PHYSICAL EXAMINATION: VITAL SIGNS:  Temperature 103.5, pulse 108, blood pressure 93/84,  respiratory rate 18, oxygen saturation 96% on 2 liters nasal cannula. GENERAL APPEARANCE:  Alert, cachectic,  cooperative to examination. HEAD:  Normocephalic and atraumatic. EYES:  Vision grossly intact.  Pupils equal, round, and reactive to light.  No injection, anicteric. MOUTH:  Pharynx pink and moist.  No erythema or exudates. NECK:  Supple, full range of motion.  No thyromegaly, JVD, or carotid  bruits. LUNGS:  Normal respiratory effort.  No accessory muscle use.  Normal breath sounds.  No crackles or wheezes. HEART:  Normal rate, regular rhythm, 3/6 systolic ejection murmur heard at the left and right upper sternal border without radiation.  No gallops or rubs. ABDOMEN:  Soft, nontender, normal bowel sounds.  No distention, guarding, rebound, or hepatosplenomegaly. MUSCULOSKELETAL:  No joint swelling, warmth, or redness. PULSES:  2+ dorsalis pedis pulse bilaterally. EXTREMITIES:  Significant pretibial edema on left lower extremity with hyperpigmented, hyperkeratotic, lichenified skin on the anterior left leg.  Left lower leg skin is notably quite warm to the touch. NEUROLOGIC:  Alert and oriented x 3.  Cranial nerves II through XII intact.  Strength normal in all extremities.  Sensation intact to light touch. PSYCHIATRY:  Oriented x 3.  Memory intact for recent and remote, normally interactive, good eye contact, not anxious or depressed appearing  ADMISSION LABORATORY DATA:  White blood cell count 6.9, hemoglobin 9.5, hematocrit 29.0, platelet count 138.  Sodium 137, potassium 3.6, chloride 103, bicarb 28, BUN 22, creatinine 6.93, glucose 76, bilirubin 0.7, alkaline phosphatase 64, AST 55, ALT 22, total protein 9.0, albumin 1.7, calcium 8.0.  HOSPITAL COURSE: 1. Methicillin-sensitive Staphylococcus aureus bacteremia.  On     admission, [name] was febrile, tachycardic and complaining     of pain in her left lower leg.  She was started empirically on     intravenous vancomycin because of concern for possible     staphylococcal infection involving the left lower extremity.      Admission blood cultures were subsequently found to be 2/2     positive for Staphylococcus aureus that was sensitive to     methicillin.  Antibiotics were then changed from vancomycin to     cefazolin.  Repeat blood cultures drawn 2 days after beginning IV     antibiotics were negative for bacterial growth.  Fevers resolved     shortly after initiating antibiotics.  Pain in left lower extremity     resolved after several days of antibiotics.  She was subsequently      discharged with a plan to complete 28 days of antibiotics with      hemodialysis.  She will follow up with Dr. Tommy Medal in the [facility] for Infectious Disease just prior to completing this planned  antibiotic course.  2. Right lower extremity cellulitis.  [name] has chronic left     lower extremity edema with significant thickening of the skin;     however, on admission, she complained of acute pain in that     extremity.  The skin of her left lower leg was also notably hotter     to the touch than the rest of her skin.  X-ray and subsequently a     CT of her left lower leg were obtained to evaluate for possible     abscess; however, no abscess was identified.  Although pain was     thought to most likely be secondary to infection, there was a     concern given her swelling and tenderness to palpation that a clot     might also be present; however, lower extremity Doppler was     negative for deep venous thrombosis.  It was thought that the left     lower extremity cellulitis served as the origin of infection for     her methicillin-sensitive Staphylococcus aureus bacteremia.  Her     pain and the warmth of the extremity resolved after the first few     days of antibiotics.  As discussed above she will complete a     20-day course of cefazolin.  3. Human immunodeficiency virus infection.  [name] reported     stopping her antiretroviral medications as well as her prophylactic     medications several  months prior to admission.  Most recent CD4     count prior to admission was 108 in November 2011; therefore, she     was initially started on Bactrim as well as azithromycin     for PCP and MAC prophylaxis.  However, CD4 count was subsequently     found to be 300 on this admission and those prophylactic     medications were stopped.  She will follow up with Dr. Tommy Medal in     the [facility] for further management of     her human immunodeficiency virus infection.  4. End-stage renal disease, on hemodialysis, managed per the Renal     team.  She was discharged on her usual Tuesday, Thursday, Saturday     dialysis schedule with plans to complete a 28-day course of     antibiotics in dialysis.  5. Monoclonal Gammopathy of Undetermined Significance (MGUS).  On      admission, [name] was found to have a significant protein-albumin      gap that was new in the last year.  Serum protein electrophoresis      was performed, which demonstrated a restricted band consistent with      a monoclonal protein.  The monoclonal protein peak accounted for      3.35 g/dL of the total, 3.42 g/dL of protein in the gamma region.       She subsequently underwent a CT-guided bone marrow biopsy and      aspiration to evaluate for possible myeloma.  The bone marrow was      found to be hypercellular with trilineage hematopoiesis.  This      finding is nonspecific and may be related to human immunodeficiency      virus infection.  Bone marrow was noted for plasmacytosis estimated at     14% of all cells.  However, the plasma cells showed a polyclonal     staining pattern for kappa  and lambda light chains; therefore, the     overall appearance of the biopsy was considered nonspecific and not     diagnostic of a plasma cell dyscrasia.  No further evaluation was     felt to be needed at this time.  6. Anemia of chronic disease.  Hemoglobin stable at approximately 8.6     throughout  admission.  She was treated with Aranesp and iron.  7. Relative hypotension.  [name] systolic blood pressures tend     to run low with systolics typically in the 90s or low 100s, this is     consistent with past admissions.  Her blood pressure was     stable and she was asymptomatic.  DISCHARGE VITAL SIGNS:  Temperature 99.4, blood pressure 112/65, pulse 91, respiratory rate 17, oxygen saturation 94% on room air.  DISCHARGE LABORATORY DATA:  White blood cell count 2.2, hemoglobin 8.6, hematocrit 26.1, platelet count 127.   ______________________________ [name] Sarin, MD   ______________________________ [name] Linsey, MD   EB/MEDQ  D:  03/05/2010  T:  03/06/2010  Job:  [id number]  cc:   Alcide Evener, MD Janell Quiet, MD Maudie Flakes. Hassell Done, M.D.  Electronically Signed by [name] Sarin MD on 03/10/2010 11:27:55 PM Electronically Signed by [name]  on 03/15/2010 08:51:09 AM

## 2010-03-18 ENCOUNTER — Ambulatory Visit: Payer: Medicaid Other | Admitting: Infectious Diseases

## 2010-03-20 LAB — COMPREHENSIVE METABOLIC PANEL
ALT: 22 U/L (ref 0–35)
ALT: 48 U/L — ABNORMAL HIGH (ref 0–35)
AST: 106 U/L — ABNORMAL HIGH (ref 0–37)
AST: 59 U/L — ABNORMAL HIGH (ref 0–37)
Albumin: 2 g/dL — ABNORMAL LOW (ref 3.5–5.2)
Albumin: 2.4 g/dL — ABNORMAL LOW (ref 3.5–5.2)
Alkaline Phosphatase: 106 U/L (ref 39–117)
Alkaline Phosphatase: 159 U/L — ABNORMAL HIGH (ref 39–117)
BUN: 37 mg/dL — ABNORMAL HIGH (ref 6–23)
BUN: 41 mg/dL — ABNORMAL HIGH (ref 6–23)
CO2: 23 mEq/L (ref 19–32)
CO2: 25 mEq/L (ref 19–32)
Calcium: 8.1 mg/dL — ABNORMAL LOW (ref 8.4–10.5)
Calcium: 8.8 mg/dL (ref 8.4–10.5)
Chloride: 98 mEq/L (ref 96–112)
Chloride: 98 mEq/L (ref 96–112)
Creatinine, Ser: 5.98 mg/dL — ABNORMAL HIGH (ref 0.4–1.2)
Creatinine, Ser: 7.58 mg/dL — ABNORMAL HIGH (ref 0.4–1.2)
GFR calc Af Amer: 7 mL/min — ABNORMAL LOW (ref >60)
GFR calc Af Amer: 9 mL/min — ABNORMAL LOW (ref >60)
GFR calc non Af Amer: 6 mL/min — ABNORMAL LOW (ref >60)
GFR calc non Af Amer: 7 mL/min — ABNORMAL LOW (ref >60)
Glucose, Bld: 76 mg/dL (ref 70–99)
Glucose, Bld: 83 mg/dL (ref 70–99)
Potassium: 3.9 mEq/L (ref 3.5–5.1)
Potassium: 4.5 mEq/L (ref 3.5–5.1)
Sodium: 128 mEq/L — ABNORMAL LOW (ref 135–145)
Sodium: 134 mEq/L — ABNORMAL LOW (ref 135–145)
Total Bilirubin: 1 mg/dL (ref 0.3–1.2)
Total Bilirubin: 2.4 mg/dL — ABNORMAL HIGH (ref 0.3–1.2)
Total Protein: 9.2 g/dL — ABNORMAL HIGH (ref 6.0–8.3)
Total Protein: 9.7 g/dL — ABNORMAL HIGH (ref 6.0–8.3)

## 2010-03-20 LAB — CULTURE, BLOOD (ROUTINE X 2)
Culture  Setup Time: 201111080541
Culture  Setup Time: 201111080541
Culture: NO GROWTH

## 2010-03-20 LAB — CBC
HCT: 34.2 % — ABNORMAL LOW (ref 36.0–46.0)
HCT: 35.3 % — ABNORMAL LOW (ref 36.0–46.0)
HCT: 40.8 % (ref 36.0–46.0)
Hemoglobin: 10.8 g/dL — ABNORMAL LOW (ref 12.0–15.0)
Hemoglobin: 11.3 g/dL — ABNORMAL LOW (ref 12.0–15.0)
Hemoglobin: 12.6 g/dL (ref 12.0–15.0)
MCH: 32.6 pg (ref 26.0–34.0)
MCH: 32.8 pg (ref 26.0–34.0)
MCH: 33.5 pg (ref 26.0–34.0)
MCHC: 30.9 g/dL (ref 30.0–36.0)
MCHC: 31.6 g/dL (ref 30.0–36.0)
MCHC: 32 g/dL (ref 30.0–36.0)
MCV: 104 fL — ABNORMAL HIGH (ref 78.0–100.0)
MCV: 104.7 fL — ABNORMAL HIGH (ref 78.0–100.0)
MCV: 105.7 fL — ABNORMAL HIGH (ref 78.0–100.0)
Platelets: 83 10*3/uL — ABNORMAL LOW (ref 150–400)
Platelets: 96 10*3/uL — ABNORMAL LOW (ref 150–400)
Platelets: 97 10*3/uL — ABNORMAL LOW (ref 150–400)
RBC: 3.29 MIL/uL — ABNORMAL LOW (ref 3.87–5.11)
RBC: 3.37 MIL/uL — ABNORMAL LOW (ref 3.87–5.11)
RBC: 3.86 MIL/uL — ABNORMAL LOW (ref 3.87–5.11)
RDW: 15.9 % — ABNORMAL HIGH (ref 11.5–15.5)
RDW: 16 % — ABNORMAL HIGH (ref 11.5–15.5)
RDW: 16 % — ABNORMAL HIGH (ref 11.5–15.5)
WBC: 2.3 10*3/uL — ABNORMAL LOW (ref 4.0–10.5)
WBC: 4 10*3/uL (ref 4.0–10.5)
WBC: 5.9 10*3/uL (ref 4.0–10.5)

## 2010-03-20 LAB — CARDIAC PANEL(CRET KIN+CKTOT+MB+TROPI)
CK, MB: 1.6 ng/mL (ref 0.3–4.0)
CK, MB: 1.7 ng/mL (ref 0.3–4.0)
CK, MB: 1.7 ng/mL (ref 0.3–4.0)
CK, MB: 1.8 ng/mL (ref 0.3–4.0)
CK, MB: 2.6 ng/mL (ref 0.3–4.0)
Relative Index: 1 (ref 0.0–2.5)
Relative Index: 1.1 (ref 0.0–2.5)
Relative Index: 1.1 (ref 0.0–2.5)
Relative Index: 1.4 (ref 0.0–2.5)
Relative Index: 2 (ref 0.0–2.5)
Total CK: 127 U/L (ref 7–177)
Total CK: 128 U/L (ref 7–177)
Total CK: 142 U/L (ref 7–177)
Total CK: 149 U/L (ref 7–177)
Total CK: 162 U/L (ref 7–177)
Troponin I: 0.08 ng/mL — ABNORMAL HIGH (ref 0.00–0.06)
Troponin I: 0.09 ng/mL — ABNORMAL HIGH (ref 0.00–0.06)
Troponin I: 0.1 ng/mL — ABNORMAL HIGH (ref 0.00–0.06)
Troponin I: 0.12 ng/mL — ABNORMAL HIGH (ref 0.00–0.06)
Troponin I: 0.13 ng/mL — ABNORMAL HIGH (ref 0.00–0.06)

## 2010-03-20 LAB — T-HELPER CELLS (CD4) COUNT (NOT AT ARMC)
CD4 % Helper T Cell: 17 % — ABNORMAL LOW (ref 33–55)
CD4 T Cell Abs: 100 uL — ABNORMAL LOW (ref 400–2700)

## 2010-03-20 LAB — DIFFERENTIAL
Basophils Absolute: 0 10*3/uL (ref 0.0–0.1)
Basophils Relative: 0 % (ref 0–1)
Eosinophils Absolute: 0 10*3/uL (ref 0.0–0.7)
Eosinophils Relative: 0 % (ref 0–5)
Lymphocytes Relative: 23 % (ref 12–46)
Lymphs Abs: 0.5 10*3/uL — ABNORMAL LOW (ref 0.7–4.0)
Monocytes Absolute: 0.3 10*3/uL (ref 0.1–1.0)
Monocytes Relative: 12 % (ref 3–12)
Neutro Abs: 1.5 10*3/uL — ABNORMAL LOW (ref 1.7–7.7)
Neutrophils Relative %: 65 % (ref 43–77)

## 2010-03-20 LAB — CK TOTAL AND CKMB (NOT AT ARMC)
CK, MB: 1.2 ng/mL (ref 0.3–4.0)
Relative Index: 0.8 (ref 0.0–2.5)
Total CK: 149 U/L (ref 7–177)

## 2010-03-20 LAB — LACTIC ACID, PLASMA: Lactic Acid, Venous: 3.3 mmol/L — ABNORMAL HIGH (ref 0.5–2.2)

## 2010-03-20 LAB — BASIC METABOLIC PANEL
BUN: 25 mg/dL — ABNORMAL HIGH (ref 6–23)
CO2: 28 mEq/L (ref 19–32)
Calcium: 7.9 mg/dL — ABNORMAL LOW (ref 8.4–10.5)
Chloride: 97 mEq/L (ref 96–112)
Creatinine, Ser: 4.8 mg/dL — ABNORMAL HIGH (ref 0.4–1.2)
GFR calc Af Amer: 11 mL/min — ABNORMAL LOW (ref >60)
GFR calc non Af Amer: 9 mL/min — ABNORMAL LOW (ref >60)
Glucose, Bld: 112 mg/dL — ABNORMAL HIGH (ref 70–99)
Potassium: 5.6 mEq/L — ABNORMAL HIGH (ref 3.5–5.1)
Sodium: 131 mEq/L — ABNORMAL LOW (ref 135–145)

## 2010-03-20 LAB — HIV-1 RNA QUANT-NO REFLEX-BLD
HIV 1 RNA Quant: 252000 copies/mL — ABNORMAL HIGH (ref <20)
HIV-1 RNA Quant, Log: 5.4 {Log} — ABNORMAL HIGH (ref <1.30)

## 2010-03-20 LAB — GLUCOSE, CAPILLARY: Glucose-Capillary: 107 mg/dL — ABNORMAL HIGH (ref 70–99)

## 2010-03-20 LAB — TROPONIN I: Troponin I: 0.11 ng/mL — ABNORMAL HIGH (ref 0.00–0.06)

## 2010-03-20 LAB — MRSA PCR SCREENING: MRSA by PCR: NEGATIVE

## 2010-03-20 LAB — PHOSPHORUS: Phosphorus: 3 mg/dL (ref 2.3–4.6)

## 2010-03-21 LAB — CBC
HCT: 34.5 % — ABNORMAL LOW (ref 36.0–46.0)
HCT: 36.4 % (ref 36.0–46.0)
HCT: 42.7 % (ref 36.0–46.0)
Hemoglobin: 10.3 g/dL — ABNORMAL LOW (ref 12.0–15.0)
Hemoglobin: 11.3 g/dL — ABNORMAL LOW (ref 12.0–15.0)
Hemoglobin: 12.7 g/dL (ref 12.0–15.0)
MCH: 32.1 pg (ref 26.0–34.0)
MCH: 32.5 pg (ref 26.0–34.0)
MCH: 33.2 pg (ref 26.0–34.0)
MCHC: 29.7 g/dL — ABNORMAL LOW (ref 30.0–36.0)
MCHC: 29.9 g/dL — ABNORMAL LOW (ref 30.0–36.0)
MCHC: 31 g/dL (ref 30.0–36.0)
MCV: 107.1 fL — ABNORMAL HIGH (ref 78.0–100.0)
MCV: 107.8 fL — ABNORMAL HIGH (ref 78.0–100.0)
MCV: 108.8 fL — ABNORMAL HIGH (ref 78.0–100.0)
Platelets: 108 10*3/uL — ABNORMAL LOW (ref 150–400)
Platelets: 139 10*3/uL — ABNORMAL LOW (ref 150–400)
Platelets: 144 10*3/uL — ABNORMAL LOW (ref 150–400)
RBC: 3.17 MIL/uL — ABNORMAL LOW (ref 3.87–5.11)
RBC: 3.4 MIL/uL — ABNORMAL LOW (ref 3.87–5.11)
RBC: 3.96 MIL/uL (ref 3.87–5.11)
RDW: 16.6 % — ABNORMAL HIGH (ref 11.5–15.5)
RDW: 17 % — ABNORMAL HIGH (ref 11.5–15.5)
RDW: 17 % — ABNORMAL HIGH (ref 11.5–15.5)
WBC: 3.1 10*3/uL — ABNORMAL LOW (ref 4.0–10.5)
WBC: 5.5 10*3/uL (ref 4.0–10.5)
WBC: 6.2 10*3/uL (ref 4.0–10.5)

## 2010-03-21 LAB — CULTURE, BLOOD (ROUTINE X 2)
Culture  Setup Time: 201109192144
Culture  Setup Time: 201109192145
Culture: NO GROWTH

## 2010-03-21 LAB — HIV-1 RNA QUANT-NO REFLEX-BLD
HIV 1 RNA Quant: 213000 copies/mL — ABNORMAL HIGH (ref <20)
HIV-1 RNA Quant, Log: 5.33 {Log} — ABNORMAL HIGH (ref <1.30)

## 2010-03-21 LAB — DIFFERENTIAL
Basophils Absolute: 0 10*3/uL (ref 0.0–0.1)
Basophils Absolute: 0 10*3/uL (ref 0.0–0.1)
Basophils Relative: 0 % (ref 0–1)
Basophils Relative: 0 % (ref 0–1)
Eosinophils Absolute: 0 10*3/uL (ref 0.0–0.7)
Eosinophils Absolute: 0 10*3/uL (ref 0.0–0.7)
Eosinophils Relative: 0 % (ref 0–5)
Eosinophils Relative: 0 % (ref 0–5)
Lymphocytes Relative: 15 % (ref 12–46)
Lymphocytes Relative: 16 % (ref 12–46)
Lymphs Abs: 0.9 10*3/uL (ref 0.7–4.0)
Lymphs Abs: 1 10*3/uL (ref 0.7–4.0)
Monocytes Absolute: 0.5 10*3/uL (ref 0.1–1.0)
Monocytes Absolute: 0.6 10*3/uL (ref 0.1–1.0)
Monocytes Relative: 11 % (ref 3–12)
Monocytes Relative: 9 % (ref 3–12)
Neutro Abs: 4 10*3/uL (ref 1.7–7.7)
Neutro Abs: 4.7 10*3/uL (ref 1.7–7.7)
Neutrophils Relative %: 73 % (ref 43–77)
Neutrophils Relative %: 75 % (ref 43–77)

## 2010-03-21 LAB — RENAL FUNCTION PANEL
Albumin: 1.8 g/dL — ABNORMAL LOW (ref 3.5–5.2)
BUN: 23 mg/dL (ref 6–23)
CO2: 27 mEq/L (ref 19–32)
Calcium: 8.2 mg/dL — ABNORMAL LOW (ref 8.4–10.5)
Chloride: 103 mEq/L (ref 96–112)
Creatinine, Ser: 6.15 mg/dL — ABNORMAL HIGH (ref 0.4–1.2)
GFR calc Af Amer: 9 mL/min — ABNORMAL LOW (ref >60)
GFR calc non Af Amer: 7 mL/min — ABNORMAL LOW (ref >60)
Glucose, Bld: 83 mg/dL (ref 70–99)
Phosphorus: 3.1 mg/dL (ref 2.3–4.6)
Potassium: 3.7 mEq/L (ref 3.5–5.1)
Sodium: 136 mEq/L (ref 135–145)

## 2010-03-21 LAB — BASIC METABOLIC PANEL
BUN: 25 mg/dL — ABNORMAL HIGH (ref 6–23)
CO2: 25 mEq/L (ref 19–32)
Calcium: 7.9 mg/dL — ABNORMAL LOW (ref 8.4–10.5)
Chloride: 96 mEq/L (ref 96–112)
Creatinine, Ser: 6.03 mg/dL — ABNORMAL HIGH (ref 0.4–1.2)
GFR calc Af Amer: 9 mL/min — ABNORMAL LOW (ref >60)
GFR calc non Af Amer: 7 mL/min — ABNORMAL LOW (ref >60)
Glucose, Bld: 69 mg/dL — ABNORMAL LOW (ref 70–99)
Potassium: 4.8 mEq/L (ref 3.5–5.1)
Sodium: 129 mEq/L — ABNORMAL LOW (ref 135–145)

## 2010-03-21 LAB — PROTIME-INR
INR: 1.2 (ref 0.00–1.49)
Prothrombin Time: 15.4 seconds — ABNORMAL HIGH (ref 11.6–15.2)

## 2010-03-21 LAB — PROCALCITONIN: Procalcitonin: 16.61 ng/mL

## 2010-03-21 LAB — COMPREHENSIVE METABOLIC PANEL
ALT: 11 U/L (ref 0–35)
AST: 45 U/L — ABNORMAL HIGH (ref 0–37)
Albumin: 2 g/dL — ABNORMAL LOW (ref 3.5–5.2)
Alkaline Phosphatase: 101 U/L (ref 39–117)
BUN: 21 mg/dL (ref 6–23)
CO2: 28 mEq/L (ref 19–32)
Calcium: 8.2 mg/dL — ABNORMAL LOW (ref 8.4–10.5)
Chloride: 102 mEq/L (ref 96–112)
Creatinine, Ser: 6.14 mg/dL — ABNORMAL HIGH (ref 0.4–1.2)
GFR calc Af Amer: 9 mL/min — ABNORMAL LOW (ref >60)
GFR calc non Af Amer: 7 mL/min — ABNORMAL LOW (ref >60)
Glucose, Bld: 86 mg/dL (ref 70–99)
Potassium: 5.7 mEq/L — ABNORMAL HIGH (ref 3.5–5.1)
Sodium: 134 mEq/L — ABNORMAL LOW (ref 135–145)
Total Bilirubin: 1.1 mg/dL (ref 0.3–1.2)
Total Protein: 8.6 g/dL — ABNORMAL HIGH (ref 6.0–8.3)

## 2010-03-21 LAB — CLOSTRIDIUM DIFFICILE BY PCR: Toxigenic C. Difficile by PCR: NOT DETECTED

## 2010-03-21 LAB — APTT: aPTT: 39 seconds — ABNORMAL HIGH (ref 24–37)

## 2010-03-21 LAB — HLA-B27 ANTIGEN

## 2010-03-21 LAB — LIPID PANEL
Cholesterol: 103 mg/dL (ref 0–200)
HDL: 12 mg/dL — ABNORMAL LOW (ref >39)
LDL Cholesterol: 77 mg/dL (ref 0–99)
Total CHOL/HDL Ratio: 8.6 RATIO
Triglycerides: 69 mg/dL (ref <150)
VLDL: 14 mg/dL (ref 0–40)

## 2010-03-21 LAB — T-HELPER CELLS (CD4) COUNT (NOT AT ARMC)
CD4 % Helper T Cell: 16 % — ABNORMAL LOW (ref 33–55)
CD4 T Cell Abs: 150 uL — ABNORMAL LOW (ref 400–2700)

## 2010-03-21 LAB — CK TOTAL AND CKMB (NOT AT ARMC)
CK, MB: 1 ng/mL (ref 0.3–4.0)
Relative Index: INVALID (ref 0.0–2.5)
Total CK: 70 U/L (ref 7–177)

## 2010-03-21 LAB — LIPASE, BLOOD: Lipase: 17 U/L (ref 11–59)

## 2010-03-21 LAB — CRYPTOSPORIDIUM SMEAR, FECAL

## 2010-03-21 LAB — STOOL CULTURE

## 2010-03-21 LAB — LACTIC ACID, PLASMA: Lactic Acid, Venous: 1.6 mmol/L (ref 0.5–2.2)

## 2010-03-21 LAB — GIARDIA/CRYPTOSPORIDIUM SCREEN(EIA)
Cryptosporidium Screen (EIA): NEGATIVE
Giardia Screen - EIA: NEGATIVE

## 2010-03-21 LAB — ALBUMIN: Albumin: 1.8 g/dL — ABNORMAL LOW (ref 3.5–5.2)

## 2010-03-21 LAB — TROPONIN I: Troponin I: 0.07 ng/mL — ABNORMAL HIGH (ref 0.00–0.06)

## 2010-03-21 LAB — MRSA PCR SCREENING: MRSA by PCR: NEGATIVE

## 2010-03-21 LAB — PHOSPHORUS: Phosphorus: 4 mg/dL (ref 2.3–4.6)

## 2010-03-23 LAB — CBC
HCT: 20.9 % — ABNORMAL LOW (ref 36.0–46.0)
HCT: 22.4 % — ABNORMAL LOW (ref 36.0–46.0)
HCT: 22.9 % — ABNORMAL LOW (ref 36.0–46.0)
HCT: 26 % — ABNORMAL LOW (ref 36.0–46.0)
HCT: 26.5 % — ABNORMAL LOW (ref 36.0–46.0)
HCT: 27.3 % — ABNORMAL LOW (ref 36.0–46.0)
HCT: 29.3 % — ABNORMAL LOW (ref 36.0–46.0)
HCT: 32.4 % — ABNORMAL LOW (ref 36.0–46.0)
HCT: 35 % — ABNORMAL LOW (ref 36.0–46.0)
HCT: 36.2 % (ref 36.0–46.0)
Hemoglobin: 10.7 g/dL — ABNORMAL LOW (ref 12.0–15.0)
Hemoglobin: 11.4 g/dL — ABNORMAL LOW (ref 12.0–15.0)
Hemoglobin: 11.5 g/dL — ABNORMAL LOW (ref 12.0–15.0)
Hemoglobin: 7 g/dL — ABNORMAL LOW (ref 12.0–15.0)
Hemoglobin: 7.5 g/dL — ABNORMAL LOW (ref 12.0–15.0)
Hemoglobin: 7.6 g/dL — ABNORMAL LOW (ref 12.0–15.0)
Hemoglobin: 8.6 g/dL — ABNORMAL LOW (ref 12.0–15.0)
Hemoglobin: 8.9 g/dL — ABNORMAL LOW (ref 12.0–15.0)
Hemoglobin: 9.3 g/dL — ABNORMAL LOW (ref 12.0–15.0)
Hemoglobin: 9.7 g/dL — ABNORMAL LOW (ref 12.0–15.0)
MCH: 33.6 pg (ref 26.0–34.0)
MCH: 34.1 pg — ABNORMAL HIGH (ref 26.0–34.0)
MCH: 34.4 pg — ABNORMAL HIGH (ref 26.0–34.0)
MCH: 34.4 pg — ABNORMAL HIGH (ref 26.0–34.0)
MCH: 34.5 pg — ABNORMAL HIGH (ref 26.0–34.0)
MCH: 34.5 pg — ABNORMAL HIGH (ref 26.0–34.0)
MCH: 34.5 pg — ABNORMAL HIGH (ref 26.0–34.0)
MCH: 34.8 pg — ABNORMAL HIGH (ref 26.0–34.0)
MCH: 34.8 pg — ABNORMAL HIGH (ref 26.0–34.0)
MCH: 34.9 pg — ABNORMAL HIGH (ref 26.0–34.0)
MCHC: 31.8 g/dL (ref 30.0–36.0)
MCHC: 32.7 g/dL (ref 30.0–36.0)
MCHC: 33.1 g/dL (ref 30.0–36.0)
MCHC: 33.1 g/dL (ref 30.0–36.0)
MCHC: 33.1 g/dL (ref 30.0–36.0)
MCHC: 33.3 g/dL (ref 30.0–36.0)
MCHC: 33.4 g/dL (ref 30.0–36.0)
MCHC: 33.5 g/dL (ref 30.0–36.0)
MCHC: 33.6 g/dL (ref 30.0–36.0)
MCHC: 33.9 g/dL (ref 30.0–36.0)
MCV: 101.5 fL — ABNORMAL HIGH (ref 78.0–100.0)
MCV: 102.9 fL — ABNORMAL HIGH (ref 78.0–100.0)
MCV: 103.8 fL — ABNORMAL HIGH (ref 78.0–100.0)
MCV: 104 fL — ABNORMAL HIGH (ref 78.0–100.0)
MCV: 104 fL — ABNORMAL HIGH (ref 78.0–100.0)
MCV: 104.2 fL — ABNORMAL HIGH (ref 78.0–100.0)
MCV: 104.2 fL — ABNORMAL HIGH (ref 78.0–100.0)
MCV: 104.4 fL — ABNORMAL HIGH (ref 78.0–100.0)
MCV: 104.6 fL — ABNORMAL HIGH (ref 78.0–100.0)
MCV: 105.6 fL — ABNORMAL HIGH (ref 78.0–100.0)
Platelets: 114 K/uL — ABNORMAL LOW (ref 150–400)
Platelets: 124 10*3/uL — ABNORMAL LOW (ref 150–400)
Platelets: 127 10*3/uL — ABNORMAL LOW (ref 150–400)
Platelets: 133 10*3/uL — ABNORMAL LOW (ref 150–400)
Platelets: 134 10*3/uL — ABNORMAL LOW (ref 150–400)
Platelets: 137 K/uL — ABNORMAL LOW (ref 150–400)
Platelets: 139 10*3/uL — ABNORMAL LOW (ref 150–400)
Platelets: 145 10*3/uL — ABNORMAL LOW (ref 150–400)
Platelets: 150 10*3/uL (ref 150–400)
Platelets: 151 10*3/uL (ref 150–400)
RBC: 2.01 MIL/uL — ABNORMAL LOW (ref 3.87–5.11)
RBC: 2.16 MIL/uL — ABNORMAL LOW (ref 3.87–5.11)
RBC: 2.19 MIL/uL — ABNORMAL LOW (ref 3.87–5.11)
RBC: 2.5 MIL/uL — ABNORMAL LOW (ref 3.87–5.11)
RBC: 2.58 MIL/uL — ABNORMAL LOW (ref 3.87–5.11)
RBC: 2.69 MIL/uL — ABNORMAL LOW (ref 3.87–5.11)
RBC: 2.82 MIL/uL — ABNORMAL LOW (ref 3.87–5.11)
RBC: 3.11 MIL/uL — ABNORMAL LOW (ref 3.87–5.11)
RBC: 3.36 MIL/uL — ABNORMAL LOW (ref 3.87–5.11)
RBC: 3.43 MIL/uL — ABNORMAL LOW (ref 3.87–5.11)
RDW: 16.7 % — ABNORMAL HIGH (ref 11.5–15.5)
RDW: 16.7 % — ABNORMAL HIGH (ref 11.5–15.5)
RDW: 16.9 % — ABNORMAL HIGH (ref 11.5–15.5)
RDW: 16.9 % — ABNORMAL HIGH (ref 11.5–15.5)
RDW: 16.9 % — ABNORMAL HIGH (ref 11.5–15.5)
RDW: 17 % — ABNORMAL HIGH (ref 11.5–15.5)
RDW: 17 % — ABNORMAL HIGH (ref 11.5–15.5)
RDW: 17 % — ABNORMAL HIGH (ref 11.5–15.5)
RDW: 17.8 % — ABNORMAL HIGH (ref 11.5–15.5)
RDW: 17.9 % — ABNORMAL HIGH (ref 11.5–15.5)
WBC: 3 K/uL — ABNORMAL LOW (ref 4.0–10.5)
WBC: 3.3 10*3/uL — ABNORMAL LOW (ref 4.0–10.5)
WBC: 3.3 K/uL — ABNORMAL LOW (ref 4.0–10.5)
WBC: 3.8 10*3/uL — ABNORMAL LOW (ref 4.0–10.5)
WBC: 4 10*3/uL (ref 4.0–10.5)
WBC: 4.1 10*3/uL (ref 4.0–10.5)
WBC: 4.3 10*3/uL (ref 4.0–10.5)
WBC: 4.3 10*3/uL (ref 4.0–10.5)
WBC: 4.4 10*3/uL (ref 4.0–10.5)
WBC: 4.6 10*3/uL (ref 4.0–10.5)

## 2010-03-23 LAB — LIPID PANEL
Cholesterol: 96 mg/dL (ref 0–200)
HDL: <10 mg/dL — ABNORMAL LOW (ref >39)
Triglycerides: 72 mg/dL (ref <150)
VLDL: 14 mg/dL (ref 0–40)

## 2010-03-23 LAB — CULTURE, BLOOD (ROUTINE X 2)
Culture: NO GROWTH
Culture: NO GROWTH

## 2010-03-23 LAB — RENAL FUNCTION PANEL
Albumin: 1.3 g/dL — ABNORMAL LOW (ref 3.5–5.2)
Albumin: 1.8 g/dL — ABNORMAL LOW (ref 3.5–5.2)
Albumin: 1.9 g/dL — ABNORMAL LOW (ref 3.5–5.2)
BUN: 18 mg/dL (ref 6–23)
BUN: 31 mg/dL — ABNORMAL HIGH (ref 6–23)
BUN: 35 mg/dL — ABNORMAL HIGH (ref 6–23)
CO2: 26 mEq/L (ref 19–32)
CO2: 27 mEq/L (ref 19–32)
CO2: 29 meq/L (ref 19–32)
Calcium: 7.5 mg/dL — ABNORMAL LOW (ref 8.4–10.5)
Calcium: 7.9 mg/dL — ABNORMAL LOW (ref 8.4–10.5)
Calcium: 8.4 mg/dL (ref 8.4–10.5)
Chloride: 96 mEq/L (ref 96–112)
Chloride: 97 meq/L (ref 96–112)
Chloride: 98 mEq/L (ref 96–112)
Creatinine, Ser: 5.08 mg/dL — ABNORMAL HIGH (ref 0.4–1.2)
Creatinine, Ser: 6.48 mg/dL — ABNORMAL HIGH (ref 0.4–1.2)
Creatinine, Ser: 7.46 mg/dL — ABNORMAL HIGH (ref 0.4–1.2)
GFR calc Af Amer: 7 mL/min — ABNORMAL LOW (ref >60)
GFR calc Af Amer: 8 mL/min — ABNORMAL LOW (ref >60)
GFR calc non Af Amer: 6 mL/min — ABNORMAL LOW (ref >60)
GFR calc non Af Amer: 7 mL/min — ABNORMAL LOW (ref >60)
GFR calc non Af Amer: 9 mL/min — ABNORMAL LOW
Glucose, Bld: 105 mg/dL — ABNORMAL HIGH (ref 70–99)
Glucose, Bld: 92 mg/dL (ref 70–99)
Glucose, Bld: 99 mg/dL (ref 70–99)
Phosphorus: 2.5 mg/dL (ref 2.3–4.6)
Phosphorus: 3.4 mg/dL (ref 2.3–4.6)
Phosphorus: 4 mg/dL (ref 2.3–4.6)
Potassium: 4 meq/L (ref 3.5–5.1)
Potassium: 4.2 mEq/L (ref 3.5–5.1)
Potassium: 5 mEq/L (ref 3.5–5.1)
Sodium: 130 mEq/L — ABNORMAL LOW (ref 135–145)
Sodium: 131 mEq/L — ABNORMAL LOW (ref 135–145)
Sodium: 131 meq/L — ABNORMAL LOW (ref 135–145)

## 2010-03-23 LAB — POCT I-STAT, CHEM 8
BUN: 21 mg/dL (ref 6–23)
Calcium, Ion: 1.1 mmol/L — ABNORMAL LOW (ref 1.12–1.32)
Chloride: 99 mEq/L (ref 96–112)
Creatinine, Ser: 5.6 mg/dL — ABNORMAL HIGH (ref 0.4–1.2)
Glucose, Bld: 88 mg/dL (ref 70–99)
HCT: 41 % (ref 36.0–46.0)
Hemoglobin: 13.9 g/dL (ref 12.0–15.0)
Potassium: 4.4 mEq/L (ref 3.5–5.1)
Sodium: 138 mEq/L (ref 135–145)
TCO2: 32 mmol/L (ref 0–100)

## 2010-03-23 LAB — CK TOTAL AND CKMB (NOT AT ARMC)
CK, MB: 2.7 ng/mL (ref 0.3–4.0)
CK, MB: 2.7 ng/mL (ref 0.3–4.0)
CK, MB: 3.1 ng/mL (ref 0.3–4.0)
Relative Index: 1.8 (ref 0.0–2.5)
Relative Index: 2.1 (ref 0.0–2.5)
Relative Index: 2.1 (ref 0.0–2.5)
Total CK: 130 U/L (ref 7–177)
Total CK: 150 U/L (ref 7–177)
Total CK: 154 U/L (ref 7–177)

## 2010-03-23 LAB — MRSA PCR SCREENING: MRSA by PCR: POSITIVE — AB

## 2010-03-23 LAB — TYPE AND SCREEN
ABO/RH(D): O POS
Antibody Screen: NEGATIVE

## 2010-03-23 LAB — CARDIAC PANEL(CRET KIN+CKTOT+MB+TROPI)
CK, MB: 2.5 ng/mL (ref 0.3–4.0)
CK, MB: 2.5 ng/mL (ref 0.3–4.0)
Relative Index: 2.2 (ref 0.0–2.5)
Relative Index: 2.2 (ref 0.0–2.5)
Total CK: 113 U/L (ref 7–177)
Total CK: 114 U/L (ref 7–177)

## 2010-03-23 LAB — BASIC METABOLIC PANEL WITH GFR
BUN: 20 mg/dL (ref 6–23)
CO2: 27 meq/L (ref 19–32)
Calcium: 8.4 mg/dL (ref 8.4–10.5)
Chloride: 97 meq/L (ref 96–112)
Creatinine, Ser: 5.29 mg/dL — ABNORMAL HIGH (ref 0.4–1.2)
GFR calc non Af Amer: 9 mL/min — ABNORMAL LOW
Glucose, Bld: 97 mg/dL (ref 70–99)
Potassium: 4.4 meq/L (ref 3.5–5.1)
Sodium: 133 meq/L — ABNORMAL LOW (ref 135–145)

## 2010-03-23 LAB — APTT: aPTT: 34 s (ref 24–37)

## 2010-03-23 LAB — POCT CARDIAC MARKERS
CKMB, poc: 4.6 ng/mL (ref 1.0–8.0)
Myoglobin, poc: >500 ng/mL (ref 12–200)
Troponin i, poc: <0.05 ng/mL (ref 0.00–0.09)

## 2010-03-23 LAB — DIFFERENTIAL
Basophils Absolute: 0 10*3/uL (ref 0.0–0.1)
Basophils Absolute: 0 10*3/uL (ref 0.0–0.1)
Basophils Relative: 1 % (ref 0–1)
Basophils Relative: 1 % (ref 0–1)
Eosinophils Absolute: 0 10*3/uL (ref 0.0–0.7)
Eosinophils Absolute: 0 10*3/uL (ref 0.0–0.7)
Eosinophils Relative: 0 % (ref 0–5)
Eosinophils Relative: 1 % (ref 0–5)
Lymphocytes Relative: 37 % (ref 12–46)
Lymphocytes Relative: 37 % (ref 12–46)
Lymphs Abs: 1.2 10*3/uL (ref 0.7–4.0)
Lymphs Abs: 1.6 10*3/uL (ref 0.7–4.0)
Monocytes Absolute: 0.4 10*3/uL (ref 0.1–1.0)
Monocytes Absolute: 0.4 10*3/uL (ref 0.1–1.0)
Monocytes Relative: 11 % (ref 3–12)
Monocytes Relative: 9 % (ref 3–12)
Neutro Abs: 1.7 10*3/uL (ref 1.7–7.7)
Neutro Abs: 2.3 10*3/uL (ref 1.7–7.7)
Neutrophils Relative %: 51 % (ref 43–77)
Neutrophils Relative %: 52 % (ref 43–77)

## 2010-03-23 LAB — IRON AND TIBC
Iron: 36 ug/dL — ABNORMAL LOW (ref 42–135)
UIBC: <55 ug/dL

## 2010-03-23 LAB — PROTIME-INR
INR: 1.27 (ref 0.00–1.49)
Prothrombin Time: 15.8 seconds — ABNORMAL HIGH (ref 11.6–15.2)

## 2010-03-23 LAB — HEPATITIS B SURFACE ANTIGEN
Hepatitis B Surface Ag: NEGATIVE
Hepatitis B Surface Ag: NEGATIVE

## 2010-03-23 LAB — TROPONIN I: Troponin I: 0.06 ng/mL (ref 0.00–0.06)

## 2010-03-23 LAB — LACTIC ACID, PLASMA: Lactic Acid, Venous: 3.5 mmol/L — ABNORMAL HIGH (ref 0.5–2.2)

## 2010-03-23 LAB — PROCALCITONIN: Procalcitonin: <0.5 ng/mL

## 2010-03-24 ENCOUNTER — Encounter: Payer: Self-pay | Admitting: Internal Medicine

## 2010-03-24 LAB — T-HELPER CELL (CD4) - (RCID CLINIC ONLY)
CD4 % Helper T Cell: 19 % — ABNORMAL LOW (ref 33–55)
CD4 T Cell Abs: 210 uL — ABNORMAL LOW (ref 400–2700)

## 2010-03-25 LAB — RENAL FUNCTION PANEL
Albumin: 1.9 g/dL — ABNORMAL LOW (ref 3.5–5.2)
Albumin: 1.9 g/dL — ABNORMAL LOW (ref 3.5–5.2)
Albumin: 1.9 g/dL — ABNORMAL LOW (ref 3.5–5.2)
Albumin: 2.1 g/dL — ABNORMAL LOW (ref 3.5–5.2)
BUN: 10 mg/dL (ref 6–23)
BUN: 16 mg/dL (ref 6–23)
BUN: 21 mg/dL (ref 6–23)
BUN: 22 mg/dL (ref 6–23)
CO2: 29 mEq/L (ref 19–32)
CO2: 30 mEq/L (ref 19–32)
CO2: 31 mEq/L (ref 19–32)
CO2: 32 mEq/L (ref 19–32)
Calcium: 8.1 mg/dL — ABNORMAL LOW (ref 8.4–10.5)
Calcium: 8.3 mg/dL — ABNORMAL LOW (ref 8.4–10.5)
Calcium: 8.5 mg/dL (ref 8.4–10.5)
Calcium: 8.5 mg/dL (ref 8.4–10.5)
Chloride: 94 mEq/L — ABNORMAL LOW (ref 96–112)
Chloride: 96 mEq/L (ref 96–112)
Chloride: 97 mEq/L (ref 96–112)
Chloride: 97 mEq/L (ref 96–112)
Creatinine, Ser: 3.61 mg/dL — ABNORMAL HIGH (ref 0.4–1.2)
Creatinine, Ser: 4.81 mg/dL — ABNORMAL HIGH (ref 0.4–1.2)
Creatinine, Ser: 5.25 mg/dL — ABNORMAL HIGH (ref 0.4–1.2)
Creatinine, Ser: 6 mg/dL — ABNORMAL HIGH (ref 0.4–1.2)
GFR calc Af Amer: 10 mL/min — ABNORMAL LOW (ref >60)
GFR calc Af Amer: 12 mL/min — ABNORMAL LOW (ref >60)
GFR calc Af Amer: 16 mL/min — ABNORMAL LOW (ref >60)
GFR calc Af Amer: 9 mL/min — ABNORMAL LOW (ref >60)
GFR calc non Af Amer: 10 mL/min — ABNORMAL LOW (ref >60)
GFR calc non Af Amer: 13 mL/min — ABNORMAL LOW (ref >60)
GFR calc non Af Amer: 7 mL/min — ABNORMAL LOW (ref >60)
GFR calc non Af Amer: 9 mL/min — ABNORMAL LOW (ref >60)
Glucose, Bld: 73 mg/dL (ref 70–99)
Glucose, Bld: 82 mg/dL (ref 70–99)
Glucose, Bld: 82 mg/dL (ref 70–99)
Glucose, Bld: 84 mg/dL (ref 70–99)
Phosphorus: 2.3 mg/dL (ref 2.3–4.6)
Phosphorus: 2.9 mg/dL (ref 2.3–4.6)
Phosphorus: 3.1 mg/dL (ref 2.3–4.6)
Phosphorus: 3.3 mg/dL (ref 2.3–4.6)
Potassium: 2.9 mEq/L — ABNORMAL LOW (ref 3.5–5.1)
Potassium: 3.7 mEq/L (ref 3.5–5.1)
Potassium: 3.7 mEq/L (ref 3.5–5.1)
Potassium: 3.8 mEq/L (ref 3.5–5.1)
Sodium: 132 mEq/L — ABNORMAL LOW (ref 135–145)
Sodium: 133 mEq/L — ABNORMAL LOW (ref 135–145)
Sodium: 134 mEq/L — ABNORMAL LOW (ref 135–145)
Sodium: 134 mEq/L — ABNORMAL LOW (ref 135–145)

## 2010-03-25 LAB — CBC
HCT: 29.6 % — ABNORMAL LOW (ref 36.0–46.0)
HCT: 29.7 % — ABNORMAL LOW (ref 36.0–46.0)
HCT: 29.8 % — ABNORMAL LOW (ref 36.0–46.0)
HCT: 30 % — ABNORMAL LOW (ref 36.0–46.0)
HCT: 30.3 % — ABNORMAL LOW (ref 36.0–46.0)
Hemoglobin: 10 g/dL — ABNORMAL LOW (ref 12.0–15.0)
Hemoglobin: 10.1 g/dL — ABNORMAL LOW (ref 12.0–15.0)
Hemoglobin: 9.6 g/dL — ABNORMAL LOW (ref 12.0–15.0)
Hemoglobin: 9.7 g/dL — ABNORMAL LOW (ref 12.0–15.0)
Hemoglobin: 9.8 g/dL — ABNORMAL LOW (ref 12.0–15.0)
MCHC: 32.5 g/dL (ref 30.0–36.0)
MCHC: 32.7 g/dL (ref 30.0–36.0)
MCHC: 32.7 g/dL (ref 30.0–36.0)
MCHC: 33.2 g/dL (ref 30.0–36.0)
MCHC: 33.4 g/dL (ref 30.0–36.0)
MCV: 105.4 fL — ABNORMAL HIGH (ref 78.0–100.0)
MCV: 105.6 fL — ABNORMAL HIGH (ref 78.0–100.0)
MCV: 105.7 fL — ABNORMAL HIGH (ref 78.0–100.0)
MCV: 105.7 fL — ABNORMAL HIGH (ref 78.0–100.0)
MCV: 105.8 fL — ABNORMAL HIGH (ref 78.0–100.0)
Platelets: 134 10*3/uL — ABNORMAL LOW (ref 150–400)
Platelets: 135 10*3/uL — ABNORMAL LOW (ref 150–400)
Platelets: 143 10*3/uL — ABNORMAL LOW (ref 150–400)
Platelets: 146 10*3/uL — ABNORMAL LOW (ref 150–400)
Platelets: 157 10*3/uL (ref 150–400)
RBC: 2.8 MIL/uL — ABNORMAL LOW (ref 3.87–5.11)
RBC: 2.82 MIL/uL — ABNORMAL LOW (ref 3.87–5.11)
RBC: 2.82 MIL/uL — ABNORMAL LOW (ref 3.87–5.11)
RBC: 2.84 MIL/uL — ABNORMAL LOW (ref 3.87–5.11)
RBC: 2.86 MIL/uL — ABNORMAL LOW (ref 3.87–5.11)
RDW: 18.2 % — ABNORMAL HIGH (ref 11.5–15.5)
RDW: 18.5 % — ABNORMAL HIGH (ref 11.5–15.5)
RDW: 18.7 % — ABNORMAL HIGH (ref 11.5–15.5)
RDW: 18.9 % — ABNORMAL HIGH (ref 11.5–15.5)
RDW: 19 % — ABNORMAL HIGH (ref 11.5–15.5)
WBC: 3.3 10*3/uL — ABNORMAL LOW (ref 4.0–10.5)
WBC: 3.8 10*3/uL — ABNORMAL LOW (ref 4.0–10.5)
WBC: 3.9 10*3/uL — ABNORMAL LOW (ref 4.0–10.5)
WBC: 4.2 10*3/uL (ref 4.0–10.5)
WBC: 4.2 10*3/uL (ref 4.0–10.5)

## 2010-03-25 LAB — CULTURE, BLOOD (ROUTINE X 2)
Culture: NO GROWTH
Culture: NO GROWTH

## 2010-03-25 LAB — POCT I-STAT, CHEM 8
BUN: 23 mg/dL (ref 6–23)
Calcium, Ion: 1.06 mmol/L — ABNORMAL LOW (ref 1.12–1.32)
Chloride: 98 mEq/L (ref 96–112)
Creatinine, Ser: 4.9 mg/dL — ABNORMAL HIGH (ref 0.4–1.2)
Glucose, Bld: 81 mg/dL (ref 70–99)
HCT: 36 % (ref 36.0–46.0)
Hemoglobin: 12.2 g/dL (ref 12.0–15.0)
Potassium: 3.3 mEq/L — ABNORMAL LOW (ref 3.5–5.1)
Sodium: 140 mEq/L (ref 135–145)
TCO2: 36 mmol/L (ref 0–100)

## 2010-03-25 LAB — DIFFERENTIAL
Basophils Absolute: 0 10*3/uL (ref 0.0–0.1)
Basophils Absolute: 0 10*3/uL (ref 0.0–0.1)
Basophils Relative: 0 % (ref 0–1)
Basophils Relative: 0 % (ref 0–1)
Eosinophils Absolute: 0 10*3/uL (ref 0.0–0.7)
Eosinophils Absolute: 0 10*3/uL (ref 0.0–0.7)
Eosinophils Relative: 0 % (ref 0–5)
Eosinophils Relative: 0 % (ref 0–5)
Lymphocytes Relative: 23 % (ref 12–46)
Lymphocytes Relative: 24 % (ref 12–46)
Lymphs Abs: 1 10*3/uL (ref 0.7–4.0)
Lymphs Abs: 1 10*3/uL (ref 0.7–4.0)
Monocytes Absolute: 0.3 10*3/uL (ref 0.1–1.0)
Monocytes Absolute: 0.3 10*3/uL (ref 0.1–1.0)
Monocytes Relative: 7 % (ref 3–12)
Monocytes Relative: 8 % (ref 3–12)
Neutro Abs: 2.8 10*3/uL (ref 1.7–7.7)
Neutro Abs: 2.9 10*3/uL (ref 1.7–7.7)
Neutrophils Relative %: 67 % (ref 43–77)
Neutrophils Relative %: 69 % (ref 43–77)

## 2010-03-25 LAB — PROCALCITONIN: Procalcitonin: 0.74 ng/mL

## 2010-03-25 LAB — HEPATITIS B SURFACE ANTIGEN: Hepatitis B Surface Ag: NEGATIVE

## 2010-03-25 LAB — LACTIC ACID, PLASMA: Lactic Acid, Venous: 1.1 mmol/L (ref 0.5–2.2)

## 2010-03-26 LAB — T-HELPER CELL (CD4) - (RCID CLINIC ONLY)
CD4 % Helper T Cell: 23 % — ABNORMAL LOW (ref 33–55)
CD4 T Cell Abs: 320 uL — ABNORMAL LOW (ref 400–2700)

## 2010-03-27 LAB — CBC
HCT: 30 % — ABNORMAL LOW (ref 36.0–46.0)
HCT: 30.1 % — ABNORMAL LOW (ref 36.0–46.0)
HCT: 31.6 % — ABNORMAL LOW (ref 36.0–46.0)
HCT: 35.9 % — ABNORMAL LOW (ref 36.0–46.0)
HCT: 36.3 % (ref 36.0–46.0)
Hemoglobin: 10 g/dL — ABNORMAL LOW (ref 12.0–15.0)
Hemoglobin: 10.3 g/dL — ABNORMAL LOW (ref 12.0–15.0)
Hemoglobin: 11.5 g/dL — ABNORMAL LOW (ref 12.0–15.0)
Hemoglobin: 12 g/dL (ref 12.0–15.0)
Hemoglobin: 9.8 g/dL — ABNORMAL LOW (ref 12.0–15.0)
MCHC: 32.1 g/dL (ref 30.0–36.0)
MCHC: 32.6 g/dL (ref 30.0–36.0)
MCHC: 32.6 g/dL (ref 30.0–36.0)
MCHC: 33.1 g/dL (ref 30.0–36.0)
MCHC: 33.3 g/dL (ref 30.0–36.0)
MCV: 102.8 fL — ABNORMAL HIGH (ref 78.0–100.0)
MCV: 102.8 fL — ABNORMAL HIGH (ref 78.0–100.0)
MCV: 102.9 fL — ABNORMAL HIGH (ref 78.0–100.0)
MCV: 103 fL — ABNORMAL HIGH (ref 78.0–100.0)
MCV: 103.6 fL — ABNORMAL HIGH (ref 78.0–100.0)
Platelets: 122 10*3/uL — ABNORMAL LOW (ref 150–400)
Platelets: 186 10*3/uL (ref 150–400)
Platelets: 82 10*3/uL — ABNORMAL LOW (ref 150–400)
Platelets: 93 10*3/uL — ABNORMAL LOW (ref 150–400)
Platelets: 98 10*3/uL — ABNORMAL LOW (ref 150–400)
RBC: 2.9 MIL/uL — ABNORMAL LOW (ref 3.87–5.11)
RBC: 2.93 MIL/uL — ABNORMAL LOW (ref 3.87–5.11)
RBC: 3.07 MIL/uL — ABNORMAL LOW (ref 3.87–5.11)
RBC: 3.49 MIL/uL — ABNORMAL LOW (ref 3.87–5.11)
RBC: 3.52 MIL/uL — ABNORMAL LOW (ref 3.87–5.11)
RDW: 16.3 % — ABNORMAL HIGH (ref 11.5–15.5)
RDW: 17.4 % — ABNORMAL HIGH (ref 11.5–15.5)
RDW: 17.5 % — ABNORMAL HIGH (ref 11.5–15.5)
RDW: 17.5 % — ABNORMAL HIGH (ref 11.5–15.5)
RDW: 17.8 % — ABNORMAL HIGH (ref 11.5–15.5)
WBC: 3.2 10*3/uL — ABNORMAL LOW (ref 4.0–10.5)
WBC: 3.4 10*3/uL — ABNORMAL LOW (ref 4.0–10.5)
WBC: 3.4 10*3/uL — ABNORMAL LOW (ref 4.0–10.5)
WBC: 4.1 10*3/uL (ref 4.0–10.5)
WBC: 4.3 10*3/uL (ref 4.0–10.5)

## 2010-03-27 LAB — BASIC METABOLIC PANEL
BUN: 15 mg/dL (ref 6–23)
CO2: 30 mEq/L (ref 19–32)
Calcium: 7.6 mg/dL — ABNORMAL LOW (ref 8.4–10.5)
Chloride: 99 mEq/L (ref 96–112)
Creatinine, Ser: 4.15 mg/dL — ABNORMAL HIGH (ref 0.4–1.2)
GFR calc Af Amer: 14 mL/min — ABNORMAL LOW (ref >60)
GFR calc non Af Amer: 11 mL/min — ABNORMAL LOW (ref >60)
Glucose, Bld: 85 mg/dL (ref 70–99)
Potassium: 3.8 mEq/L (ref 3.5–5.1)
Sodium: 134 mEq/L — ABNORMAL LOW (ref 135–145)

## 2010-03-27 LAB — GLUCOSE, CAPILLARY
Glucose-Capillary: 101 mg/dL — ABNORMAL HIGH (ref 70–99)
Glucose-Capillary: 103 mg/dL — ABNORMAL HIGH (ref 70–99)
Glucose-Capillary: 103 mg/dL — ABNORMAL HIGH (ref 70–99)
Glucose-Capillary: 108 mg/dL — ABNORMAL HIGH (ref 70–99)
Glucose-Capillary: 109 mg/dL — ABNORMAL HIGH (ref 70–99)
Glucose-Capillary: 123 mg/dL — ABNORMAL HIGH (ref 70–99)
Glucose-Capillary: 55 mg/dL — ABNORMAL LOW (ref 70–99)
Glucose-Capillary: 64 mg/dL — ABNORMAL LOW (ref 70–99)
Glucose-Capillary: 71 mg/dL (ref 70–99)
Glucose-Capillary: 71 mg/dL (ref 70–99)
Glucose-Capillary: 75 mg/dL (ref 70–99)
Glucose-Capillary: 75 mg/dL (ref 70–99)
Glucose-Capillary: 77 mg/dL (ref 70–99)
Glucose-Capillary: 87 mg/dL (ref 70–99)

## 2010-03-27 LAB — DIFFERENTIAL
Basophils Absolute: 0 10*3/uL (ref 0.0–0.1)
Basophils Absolute: 0 10*3/uL (ref 0.0–0.1)
Basophils Relative: 1 % (ref 0–1)
Basophils Relative: 1 % (ref 0–1)
Eosinophils Absolute: 0 10*3/uL (ref 0.0–0.7)
Eosinophils Absolute: 0 10*3/uL (ref 0.0–0.7)
Eosinophils Relative: 0 % (ref 0–5)
Eosinophils Relative: 0 % (ref 0–5)
Lymphocytes Relative: 36 % (ref 12–46)
Lymphocytes Relative: 44 % (ref 12–46)
Lymphs Abs: 1.5 10*3/uL (ref 0.7–4.0)
Lymphs Abs: 1.5 10*3/uL (ref 0.7–4.0)
Monocytes Absolute: 0.3 10*3/uL (ref 0.1–1.0)
Monocytes Absolute: 0.3 10*3/uL (ref 0.1–1.0)
Monocytes Relative: 8 % (ref 3–12)
Monocytes Relative: 9 % (ref 3–12)
Neutro Abs: 1.5 10*3/uL — ABNORMAL LOW (ref 1.7–7.7)
Neutro Abs: 2.5 10*3/uL (ref 1.7–7.7)
Neutrophils Relative %: 46 % (ref 43–77)
Neutrophils Relative %: 55 % (ref 43–77)

## 2010-03-27 LAB — POCT I-STAT, CHEM 8
BUN: 20 mg/dL (ref 6–23)
Calcium, Ion: 1.11 mmol/L — ABNORMAL LOW (ref 1.12–1.32)
Chloride: 98 mEq/L (ref 96–112)
Creatinine, Ser: 6.1 mg/dL — ABNORMAL HIGH (ref 0.4–1.2)
Glucose, Bld: 86 mg/dL (ref 70–99)
HCT: 40 % (ref 36.0–46.0)
Hemoglobin: 13.6 g/dL (ref 12.0–15.0)
Potassium: 3.2 mEq/L — ABNORMAL LOW (ref 3.5–5.1)
Sodium: 137 mEq/L (ref 135–145)
TCO2: 34 mmol/L (ref 0–100)

## 2010-03-27 LAB — TECHNOLOGIST SMEAR REVIEW: Path Review: DECREASED

## 2010-03-27 LAB — RENAL FUNCTION PANEL
Albumin: 1.6 g/dL — ABNORMAL LOW (ref 3.5–5.2)
Albumin: 1.6 g/dL — ABNORMAL LOW (ref 3.5–5.2)
BUN: 26 mg/dL — ABNORMAL HIGH (ref 6–23)
BUN: 29 mg/dL — ABNORMAL HIGH (ref 6–23)
CO2: 26 mEq/L (ref 19–32)
CO2: 30 mEq/L (ref 19–32)
Calcium: 7.6 mg/dL — ABNORMAL LOW (ref 8.4–10.5)
Calcium: 8.2 mg/dL — ABNORMAL LOW (ref 8.4–10.5)
Chloride: 94 mEq/L — ABNORMAL LOW (ref 96–112)
Chloride: 94 mEq/L — ABNORMAL LOW (ref 96–112)
Creatinine, Ser: 5.56 mg/dL — ABNORMAL HIGH (ref 0.4–1.2)
Creatinine, Ser: 6.87 mg/dL — ABNORMAL HIGH (ref 0.4–1.2)
GFR calc Af Amer: 10 mL/min — ABNORMAL LOW (ref >60)
GFR calc Af Amer: 8 mL/min — ABNORMAL LOW (ref >60)
GFR calc non Af Amer: 6 mL/min — ABNORMAL LOW (ref >60)
GFR calc non Af Amer: 8 mL/min — ABNORMAL LOW (ref >60)
Glucose, Bld: 74 mg/dL (ref 70–99)
Glucose, Bld: 95 mg/dL (ref 70–99)
Phosphorus: 3.5 mg/dL (ref 2.3–4.6)
Phosphorus: 4.2 mg/dL (ref 2.3–4.6)
Potassium: 3.4 mEq/L — ABNORMAL LOW (ref 3.5–5.1)
Potassium: 3.9 mEq/L (ref 3.5–5.1)
Sodium: 127 mEq/L — ABNORMAL LOW (ref 135–145)
Sodium: 131 mEq/L — ABNORMAL LOW (ref 135–145)

## 2010-03-27 LAB — CULTURE, BLOOD (ROUTINE X 2): Culture: NO GROWTH

## 2010-03-27 LAB — COMPREHENSIVE METABOLIC PANEL
ALT: 10 U/L (ref 0–35)
AST: 28 U/L (ref 0–37)
Albumin: 1.6 g/dL — ABNORMAL LOW (ref 3.5–5.2)
Alkaline Phosphatase: 70 U/L (ref 39–117)
BUN: 28 mg/dL — ABNORMAL HIGH (ref 6–23)
CO2: 29 mEq/L (ref 19–32)
Calcium: 8 mg/dL — ABNORMAL LOW (ref 8.4–10.5)
Chloride: 94 mEq/L — ABNORMAL LOW (ref 96–112)
Creatinine, Ser: 6.68 mg/dL — ABNORMAL HIGH (ref 0.4–1.2)
GFR calc Af Amer: 8 mL/min — ABNORMAL LOW (ref >60)
GFR calc non Af Amer: 7 mL/min — ABNORMAL LOW (ref >60)
Glucose, Bld: 84 mg/dL (ref 70–99)
Potassium: 3.2 mEq/L — ABNORMAL LOW (ref 3.5–5.1)
Sodium: 130 mEq/L — ABNORMAL LOW (ref 135–145)
Total Bilirubin: 1 mg/dL (ref 0.3–1.2)
Total Protein: 9.9 g/dL — ABNORMAL HIGH (ref 6.0–8.3)

## 2010-03-27 LAB — HEMOGLOBIN A1C
Hgb A1c MFr Bld: 5.1 % (ref <5.7)
Mean Plasma Glucose: 100 mg/dL (ref <117)

## 2010-03-27 LAB — PHOSPHORUS: Phosphorus: 2.8 mg/dL (ref 2.3–4.6)

## 2010-03-27 LAB — LIPASE, BLOOD: Lipase: 22 U/L (ref 11–59)

## 2010-03-28 ENCOUNTER — Inpatient Hospital Stay (HOSPITAL_COMMUNITY)
Admission: EM | Admit: 2010-03-28 | Discharge: 2010-04-04 | DRG: 871 | Disposition: A | Discharge status: Home Health | Payer: Medicaid Other | Attending: Internal Medicine | Admitting: Internal Medicine

## 2010-03-28 ENCOUNTER — Encounter: Payer: Self-pay | Admitting: Internal Medicine

## 2010-03-28 ENCOUNTER — Emergency Department (HOSPITAL_COMMUNITY): Payer: Medicaid Other

## 2010-03-28 ENCOUNTER — Inpatient Hospital Stay (HOSPITAL_COMMUNITY): Payer: Medicaid Other

## 2010-03-28 DIAGNOSIS — I120 Hypertensive chronic kidney disease with stage 5 chronic kidney disease or end stage renal disease: Secondary | ICD-10-CM | POA: Diagnosis present

## 2010-03-28 DIAGNOSIS — D649 Anemia, unspecified: Secondary | ICD-10-CM | POA: Diagnosis present

## 2010-03-28 DIAGNOSIS — I959 Hypotension, unspecified: Secondary | ICD-10-CM

## 2010-03-28 DIAGNOSIS — R6520 Severe sepsis without septic shock: Secondary | ICD-10-CM | POA: Diagnosis present

## 2010-03-28 DIAGNOSIS — Z91199 Patient's noncompliance with other medical treatment and regimen due to unspecified reason: Secondary | ICD-10-CM

## 2010-03-28 DIAGNOSIS — Z21 Asymptomatic human immunodeficiency virus [HIV] infection status: Secondary | ICD-10-CM | POA: Diagnosis present

## 2010-03-28 DIAGNOSIS — M79609 Pain in unspecified limb: Secondary | ICD-10-CM | POA: Diagnosis present

## 2010-03-28 DIAGNOSIS — N390 Urinary tract infection, site not specified: Secondary | ICD-10-CM | POA: Diagnosis present

## 2010-03-28 DIAGNOSIS — B952 Enterococcus as the cause of diseases classified elsewhere: Secondary | ICD-10-CM | POA: Diagnosis present

## 2010-03-28 DIAGNOSIS — Z9119 Patient's noncompliance with other medical treatment and regimen: Secondary | ICD-10-CM

## 2010-03-28 DIAGNOSIS — A0472 Enterocolitis due to Clostridium difficile, not specified as recurrent: Secondary | ICD-10-CM | POA: Diagnosis present

## 2010-03-28 DIAGNOSIS — N2581 Secondary hyperparathyroidism of renal origin: Secondary | ICD-10-CM | POA: Diagnosis present

## 2010-03-28 DIAGNOSIS — A419 Sepsis, unspecified organism: Principal | ICD-10-CM | POA: Diagnosis present

## 2010-03-28 DIAGNOSIS — N186 End stage renal disease: Secondary | ICD-10-CM | POA: Diagnosis present

## 2010-03-28 DIAGNOSIS — R296 Repeated falls: Secondary | ICD-10-CM | POA: Diagnosis present

## 2010-03-28 DIAGNOSIS — D709 Neutropenia, unspecified: Secondary | ICD-10-CM | POA: Diagnosis present

## 2010-03-28 DIAGNOSIS — Z992 Dependence on renal dialysis: Secondary | ICD-10-CM

## 2010-03-28 DIAGNOSIS — E46 Unspecified protein-calorie malnutrition: Secondary | ICD-10-CM | POA: Diagnosis present

## 2010-03-28 DIAGNOSIS — Y998 Other external cause status: Secondary | ICD-10-CM

## 2010-03-28 LAB — CBC
HCT: 33.2 % — ABNORMAL LOW (ref 36.0–46.0)
Hemoglobin: 10.4 g/dL — ABNORMAL LOW (ref 12.0–15.0)
MCH: 33.2 pg (ref 26.0–34.0)
MCHC: 31.3 g/dL (ref 30.0–36.0)
MCV: 106.1 fL — ABNORMAL HIGH (ref 78.0–100.0)
Platelets: 132 10*3/uL — ABNORMAL LOW (ref 150–400)
RBC: 3.13 MIL/uL — ABNORMAL LOW (ref 3.87–5.11)
RDW: 18.5 % — ABNORMAL HIGH (ref 11.5–15.5)
WBC: 1 10*3/uL — CL (ref 4.0–10.5)

## 2010-03-28 LAB — DIFFERENTIAL
Basophils Absolute: 0 10*3/uL (ref 0.0–0.1)
Basophils Relative: 0 % (ref 0–1)
Eosinophils Absolute: 0 10*3/uL (ref 0.0–0.7)
Eosinophils Relative: 1 % (ref 0–5)
Lymphocytes Relative: 45 % (ref 12–46)
Lymphs Abs: 0.5 10*3/uL — ABNORMAL LOW (ref 0.7–4.0)
Monocytes Absolute: 0.5 10*3/uL (ref 0.1–1.0)
Monocytes Relative: 54 % — ABNORMAL HIGH (ref 3–12)
Neutro Abs: 0 10*3/uL — ABNORMAL LOW (ref 1.7–7.7)
Neutrophils Relative %: 0 % — ABNORMAL LOW (ref 43–77)

## 2010-03-28 LAB — COMPREHENSIVE METABOLIC PANEL
ALT: <8 U/L (ref 0–35)
AST: 48 U/L — ABNORMAL HIGH (ref 0–37)
Albumin: 2 g/dL — ABNORMAL LOW (ref 3.5–5.2)
Alkaline Phosphatase: 135 U/L — ABNORMAL HIGH (ref 39–117)
BUN: 18 mg/dL (ref 6–23)
CO2: 29 mEq/L (ref 19–32)
Calcium: 7.6 mg/dL — ABNORMAL LOW (ref 8.4–10.5)
Chloride: 94 mEq/L — ABNORMAL LOW (ref 96–112)
Creatinine, Ser: 4.11 mg/dL — ABNORMAL HIGH (ref 0.4–1.2)
GFR calc Af Amer: 14 mL/min — ABNORMAL LOW (ref >60)
GFR calc non Af Amer: 11 mL/min — ABNORMAL LOW (ref >60)
Glucose, Bld: 86 mg/dL (ref 70–99)
Potassium: 3.7 mEq/L (ref 3.5–5.1)
Sodium: 129 mEq/L — ABNORMAL LOW (ref 135–145)
Total Bilirubin: 1.1 mg/dL (ref 0.3–1.2)
Total Protein: 9.1 g/dL — ABNORMAL HIGH (ref 6.0–8.3)

## 2010-03-28 LAB — MRSA PCR SCREENING: MRSA by PCR: NEGATIVE

## 2010-03-28 LAB — PROCALCITONIN: Procalcitonin: 14.46 ng/mL

## 2010-03-28 LAB — LACTIC ACID, PLASMA: Lactic Acid, Venous: 1.7 mmol/L (ref 0.5–2.2)

## 2010-03-28 NOTE — H&P (Signed)
Hospital Admission Note Date: 03/28/2010  Patient name: [name] Medical record number: [id number] Date of birth: 1955/11/17 Age: 55 y.o. Gender: female PCP: Janell Quiet, MD  Medical Service:  Attending physician: Dr. Lane Hacker  Pager: Resident (R2/R3):Dr. Ina Homes    [phone] Resident (R1):Dr. Bowen    [id number]  Chief Complaint:Hypotension, fever  History of Present Illness: 55 years old female with recent medical history of cellulitis and MSSA bacteremia, presented to the dialysis center for her routine dialysis treatment. She reportedly was in good health until last night and did not experience any pains or fevers. Her left leg cellulitis had improved and edema had decreased according to the patient and her family. During dialysis, she developed fever and her BP dropped that was noted to be systolic in [id number]. She was transferred to the [facility] and was noticed to have hypotention (SBP 60s) and fever of 103 degrees. She has responded well to hydration and her BP has improved to systolic 0000000, which is her baseline. She is mentating well. She denies any diarrhea, vomiting, dizziness, night sweats, cough, congestion, chest pain and bowel or bladder disturbances.   Current Outpatient Prescriptions  Medication Sig Dispense Refill  . acetaminophen (TYLENOL) 650 MG CR tablet Take 650 mg by mouth every 8 (eight) hours as needed.        [name] aspirin 81 MG tablet Take 81 mg by mouth daily.        [name] atazanavir (REYATAZ) 300 MG capsule Take 300 mg by mouth daily with breakfast.      Held  . b complex vitamins tablet Take 1 tablet by mouth daily.        [name] b complex-vitamin c-folic acid (NEPHRO-VITE) 0.8 MG TABS Take 0.8 mg by mouth daily.        . darbepoetin (ARANESP, ALBUMIN FREE,) 150 MCG/0.3ML SOLN Inject 150 mcg into the skin. Use weekly for Epogen 20000 units. Dialysis nurse to administer.       . dronabinol (MARINOL) 10 MG capsule Take 10 mg by mouth. Daily.       [name] ENSURE (ENSURE)  Take 237 mLs by mouth 3 (three) times daily between meals.        . iron sucrose (VENOFER) 20 MG/ML injection 100 mg IV on Tuesday HD(HD RN to dispense)       . lamiVUDine (EPIVIR) 10 MG/ML solution Take 1 teaspoon by mouth every day.     Held  . paricalcitol (ZEMPLAR) 5 MCG/ML injection 4 micrograms IV- dispensed by dialysis nurse.      . ritonavir (NORVIR) 100 MG TABS Take 100 mg by mouth daily with breakfast.      Held  . Stavudine (ZERIT) 1 MG/ML SOLR Take 4 teaspoons(20 ml) by mouth every day.     Held  . sulfamethoxazole-trimethoprim (BACTRIM DS) 800-160 MG per tablet 1 tablet. Take 1 tablet by mouth every Monday, Wednesday and Friday.     Held    Allergies: Review of patient's allergies indicates no known allergies.  Past Medical History  Diagnosis Date  . Human immunodeficiency virus (HIV) disease   . Hypertension   . Renal insufficiency   . Dialysis patient     pt on dialysis since 2010.    No past surgical history on file.  Family History  Problem Relation Age of Onset  . Alcohol abuse      family h/o addiction/alcoholism  . Diabetes Cousin     first degree relatives    History   Social  History  . Marital Status: Single    Spouse Name: N/A    Number of Children: N/A  . Years of Education: N/A   Occupational History  . Not on file.   Social History Main Topics  . Smoking status: Never Smoker   . Smokeless tobacco: Not on file  . Alcohol Use: No  . Drug Use: No  . Sexually Active: Not on file   Other Topics Concern  . Not on file   Social History Narrative       Review of Systems: Negative except mentioned in HPI  Physical Exam:  T 103.4 P 90 BP 66/44 RR 20 98% RA  General Appearance:    Alert, cooperative, no distress, appears stated age  Head:    Normocephalic, without obvious abnormality, atraumatic  Eyes:    PERRL, conjunctiva/corneas clear, EOM's intact,   Ears:    Normal TM's and external ear canals, both ears  Nose:   Nares normal,  septum midline, mucosa normal, no drainage    or sinus tenderness  Throat:   Lips, mucosa, and tongue normal; teeth and gums normal  Neck:   Supple, symmetrical, trachea midline, no adenopathy;    thyroid:  no enlargement/tenderness/nodules; no carotid   bruit or JVD     Lungs:     Crackle at the bases bilaterally, respirations unlabored      Heart:    Regular rate and rhythm, S1 and S2 normal, no murmur, rub   or gallop     Abdomen:     Soft, non-tender, bowel sounds active all four quadrants,    no masses, no organomegaly  Extremities:   no cyanosis or edema on left leg, old skin changes from likely PVD  Pulses:   2+ and symmetric all extremities  Neurologic:   CNII-XII intact, normal strength, sensation and reflexes    throughout  Lab results: RBC                                      3.13       l      3.87-5.11        MIL/uL  Hemoglobin (HGB)                         10.4       l      12.0-15.0        g/dL  Hematocrit (HCT)                         33.2       l      36.0-46.0        %  MCV                                      106.1      h      78.0-100.0       fL  MCH -                                    33.2              26.0-34.0  pg  MCHC                                     31.3              30.0-36.0        g/dL  RDW                                      18.5       h      11.5-15.5        %  Platelet Count (PLT)                     132        l      150-400          K/uL Sodium (NA)                              129        l      135-145          mEq/L  Potassium (K)                            3.7               3.5-5.1          mEq/L    SLIGHT HEMOLYSIS  Chloride                                 94         l      96-112           mEq/L  CO2                                      29                19-32            mEq/L  Glucose                                  86                70-99            mg/dL  BUN                                      18                6-23              mg/dL  Creatinine                               4.11       h      0.4-1.2  mg/dL  GFR, Est Non African American            11         l      >60              mL/min  GFR, Est African American                14         l      >60              mL/min    Oversized comment, see footnote  1  Bilirubin, Total                         1.1               0.3-1.2          mg/dL  Alkaline Phosphatase                     135        h      39-117           U/L  SGOT (AST)                               48         h      0-37             U/L  SGPT (ALT)                               <8                0-35             U/L    REPEATED TO VERIFY  Total  Protein                           9.1        h      6.0-8.3          g/dL  Albumin-Blood                            2.0        l      3.5-5.2          g/dL  Calcium                                  7.6        l      8.4-10.5         Mg/dL   Procalcitonin                            14.46                              Ng/mL  Imaging results:   Other results:  Assessment & Plan by Problem: 1. Sepsis/ SIRS: Patient presented with signs of Sepsis, however her baseline BP is usually low and she  was undergoing HD, so it is hard to assess whether her hypotention was secondary to sepsis or not. She did have fever, and since she is immune compromised and known to have prior MSSA bacteremia, we will go ahead and treat her aggressively with NS infusion, broad spectrum antibiotics (Vancomycin + Ceftazidime), obtain blood culture and monitor closely. If no obvious source is found, one may consider further workup for SBE.   2. ESRD: renal service is informed and agree with volume resuscitation. They will follow.  3. Cellulitis: appears to be improved, and unlikely to be the source. However, given her dark complexion it is hard to clearly diagnose.  4. HIV: She has not been on therapy since last admission. She was supposed to restart therapy after seeing Dr. Johnnye Sima,  which she has yet to do. I will continue to hold her HIV meds for now. Check CD4 count.  5. MGUS- had BM biopsy no evidence of MM for now.  6. 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 Prophylaxis: lovenox

## 2010-03-28 NOTE — H&P (Signed)
Hospital Admission Note Date: 03/28/2010  Patient name: [name] Medical record number: [id number] Date of birth: Jun 01, 1955 Age: 55 y.o. Gender: female PCP: Janell Quiet, MD  Medical Service:  Attending physician: Dr. Lane Hacker  Pager: Resident (R2/R3):Dr. Ina Homes    [phone] Resident (R1):Dr. Decklan Mau    [id number]  Chief Complaint:Hypotension, fever  History of Present Illness: 55 years old female with recent medical history of cellulitis and MSSA bacteremia, presented to the dialysis center for her routine dialysis treatment. She reportedly was in good health until last night and did not experience any pains or fevers. Her left leg cellulitis had improved and edema had decreased according to the patient and her family. During dialysis, she developed fever and her BP dropped that was noted to be systolic in [id number]. She was transferred to the [facility] and was noticed to have hypotention (SBP 60s) and fever of 103 degrees. She has responded well to hydration and her BP has improved to systolic 0000000, which is her baseline. She is mentating well. She denies any diarrhea, vomiting, dizziness, night sweats, cough, congestion, chest pain and bowel or bladder disturbances.  Of note, the patient also complains of a fall which occurred this AM when she was getting out of the bathtub.  The pt has never had any prior falls and this one was mechanical (slipped), directly after the fall, the pt has 6/10 pain in her left gluteus which radiated down the leg.  The pt denies any decrease in ROM nor any significant exacerbating factors as the pain is constant.    Current Outpatient Prescriptions  Medication Sig Dispense Refill  . acetaminophen (TYLENOL) 650 MG CR tablet Take 650 mg by mouth every 8 (eight) hours as needed.        [name] aspirin 81 MG tablet Take 81 mg by mouth daily.        [name] atazanavir (REYATAZ) 300 MG capsule Take 300 mg by mouth daily with breakfast.      Held  . b complex vitamins  tablet Take 1 tablet by mouth daily.        [name] b complex-vitamin c-folic acid (NEPHRO-VITE) 0.8 MG TABS Take 0.8 mg by mouth daily.        . darbepoetin (ARANESP, ALBUMIN FREE,) 150 MCG/0.3ML SOLN Inject 150 mcg into the skin. Use weekly for Epogen 20000 units. Dialysis nurse to administer.       . dronabinol (MARINOL) 10 MG capsule Take 10 mg by mouth. Daily.       [name] ENSURE (ENSURE) Take 237 mLs by mouth 3 (three) times daily between meals.        . iron sucrose (VENOFER) 20 MG/ML injection 100 mg IV on Tuesday HD(HD RN to dispense)       . lamiVUDine (EPIVIR) 10 MG/ML solution Take 1 teaspoon by mouth every day.     Held  . paricalcitol (ZEMPLAR) 5 MCG/ML injection 4 micrograms IV- dispensed by dialysis nurse.      . ritonavir (NORVIR) 100 MG TABS Take 100 mg by mouth daily with breakfast.      Held  . Stavudine (ZERIT) 1 MG/ML SOLR Take 4 teaspoons(20 ml) by mouth every day.     Held  . sulfamethoxazole-trimethoprim (BACTRIM DS) 800-160 MG per tablet 1 tablet. Take 1 tablet by mouth every Monday, Wednesday and Friday.     Held    Allergies: Review of patient's allergies indicates no known allergies.  Past Medical History  Diagnosis Date  . Human immunodeficiency  virus (HIV) disease   . Hypertension   . Renal insufficiency   . Dialysis patient     pt on dialysis since 2010.    No past surgical history on file.  Family History  Problem Relation Age of Onset  . Alcohol abuse      family h/o addiction/alcoholism  . Diabetes Cousin     first degree relatives  Father who died of ACS at an old age, mother who died due to complications of renal failure.   History   Social History  . Marital Status: Single                 Pt lives in [place] with her daughter, denies tobacco, alcohol or illicit drug use.  Review of Systems: Negative except mentioned in HPI  Physical Exam:  T 103.4 P 90 BP 66/44 RR 20 98% RA  General Appearance:    Alert, cooperative, no distress, appears  stated age  Head:    Normocephalic, without obvious abnormality, atraumatic  Eyes:    PERRL, conjunctiva/corneas clear, EOM's intact,   Ears:    Normal TM's and external ear canals, both ears  Nose:   Nares normal, septum midline, mucosa normal, no drainage    or sinus tenderness  Throat:   Lips, mucosa, and tongue normal; teeth and gums normal  Neck:   Supple, symmetrical, trachea midline, no adenopathy;    thyroid:  no enlargement/tenderness/nodules; no carotid   bruit or JVD     Lungs:     Crackle at the bases bilaterally, respirations unlabored      Heart:    Regular rate and rhythm, S1 and S2 normal, 3/6 systolic murmur, rub or gallop     Abdomen  Spine:      Soft, non-tender, bowel sounds active all four quadrants,    no masses, no organomegaly  Full ROM without any point tenderness along the spinous processes.   Extremities:   no cyanosis or edema on left leg, old skin changes from likely PVD  Pulses:   2+ and symmetric all extremities  Neurologic:   CNII-XII intact, normal strength, sensation and reflexes    throughout  Lab results: RBC                                      3.13       l      3.87-5.11        MIL/uL  Hemoglobin (HGB)                         10.4       l      12.0-15.0        g/dL  Hematocrit (HCT)                         33.2       l      36.0-46.0        %  MCV                                      106.1      h      78.0-100.0       fL  MCH -  33.2              26.0-34.0        pg  MCHC                                     31.3              30.0-36.0        g/dL  RDW                                      18.5       h      11.5-15.5        %  Platelet Count (PLT)                     132        l      150-400          K/uL Sodium (NA)                              129        l      135-145          mEq/L  Potassium (K)                            3.7               3.5-5.1          mEq/L    SLIGHT HEMOLYSIS  Chloride                                  94         l      96-112           mEq/L  CO2                                      29                19-32            mEq/L  Glucose                                  86                70-99            mg/dL  BUN                                      18                6-23             mg/dL  Creatinine  4.11       h      0.4-1.2          mg/dL  GFR, Est Non African American            11         l      >60              mL/min  GFR, Est African American                14         l      >60              mL/min    Oversized comment, see footnote  1  Bilirubin, Total                         1.1               0.3-1.2          mg/dL  Alkaline Phosphatase                     135        h      39-117           U/L  SGOT (AST)                               48         h      0-37             U/L  SGPT (ALT)                               <8                0-35             U/L    REPEATED TO VERIFY  Total  Protein                           9.1        h      6.0-8.3          g/dL  Albumin-Blood                            2.0        l      3.5-5.2          g/dL  Calcium                                  7.6        l      8.4-10.5         Mg/dL   Procalcitonin                            14.46                              Ng/mL  Imaging results:  Chest Xray Stable cardiomegaly  seen on prior cross-sectional imaging reflect   combination of cardiac enlargement and pericardial effusion.  Low   lung volumes with vascular congestion but no overt edema or focal   Pneumonia.  Assessment & Plan by Problem: 1. Sepsis/ SIRS: Patient presented with signs of Sepsis but the etiology is unclear. The patients BP is usually low and she was undergoing HD, so it is hard to assess whether her hypotention was secondary to sepsis or not. She did have fever, and since she is immune compromised and known to have prior MSSA bacteremia, we will go ahead and treat her aggressively with NS  infusion, broad spectrum antibiotics (Vancomycin + Ceftazidime), obtain blood culture (x2 already drawn) and monitor closely. If no obvious source is found, one may consider further workup for SBE which would include TEE. Pt does have a 3/6 systolic murmur though this has been noted in the past.  The patient currently denies any dysuria, but I will check a UA/urine culture to rule out urosepsis. Although pneumonia is a potential cause of sepsis in this pt, non was noted on chest X-ray.   2. Left Leg pain/fall: This was mechanical and fall and is atypical for this patient.  Will perform complete pelvis x-ray as well as an AP/lateral x ray of the L Spine to RU fracture though I do not suspect this given her normal physical exam.   3. ESRD: renal service is informed and agree with volume resuscitation. They will follow.  4. Cellulitis: appears to be improved, and unlikely to be the source. However, given her dark complexion it is hard to clearly diagnose.  5. HIV: She has not been on therapy since last admission. She was supposed to restart therapy after seeing Dr. Johnnye Sima, which she has yet to do. I will continue to hold her HIV meds for now. Check CD4 count.  6. MGUS- had BM biopsy no evidence of MM for now.  7. 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 Prophylaxis: lovenox

## 2010-03-29 LAB — CBC
HCT: 31.3 % — ABNORMAL LOW (ref 36.0–46.0)
Hemoglobin: 9.9 g/dL — ABNORMAL LOW (ref 12.0–15.0)
MCH: 33.4 pg (ref 26.0–34.0)
MCHC: 31.6 g/dL (ref 30.0–36.0)
MCV: 105.7 fL — ABNORMAL HIGH (ref 78.0–100.0)
Platelets: 131 10*3/uL — ABNORMAL LOW (ref 150–400)
RBC: 2.96 MIL/uL — ABNORMAL LOW (ref 3.87–5.11)
RDW: 18.5 % — ABNORMAL HIGH (ref 11.5–15.5)
WBC: 1.2 10*3/uL — CL (ref 4.0–10.5)

## 2010-03-29 LAB — RENAL FUNCTION PANEL
Albumin: 1.8 g/dL — ABNORMAL LOW (ref 3.5–5.2)
BUN: 28 mg/dL — ABNORMAL HIGH (ref 6–23)
CO2: 29 mEq/L (ref 19–32)
Calcium: 7.9 mg/dL — ABNORMAL LOW (ref 8.4–10.5)
Chloride: 98 mEq/L (ref 96–112)
Creatinine, Ser: 5.26 mg/dL — ABNORMAL HIGH (ref 0.4–1.2)
GFR calc Af Amer: 10 mL/min — ABNORMAL LOW (ref >60)
GFR calc non Af Amer: 9 mL/min — ABNORMAL LOW (ref >60)
Glucose, Bld: 83 mg/dL (ref 70–99)
Phosphorus: 3.9 mg/dL (ref 2.3–4.6)
Potassium: 3.5 mEq/L (ref 3.5–5.1)
Sodium: 132 mEq/L — ABNORMAL LOW (ref 135–145)

## 2010-03-30 ENCOUNTER — Inpatient Hospital Stay (HOSPITAL_COMMUNITY): Payer: Medicaid Other

## 2010-03-30 DIAGNOSIS — B20 Human immunodeficiency virus [HIV] disease: Secondary | ICD-10-CM

## 2010-03-30 DIAGNOSIS — R509 Fever, unspecified: Secondary | ICD-10-CM

## 2010-03-30 LAB — CBC
HCT: 31.7 % — ABNORMAL LOW (ref 36.0–46.0)
Hemoglobin: 10.1 g/dL — ABNORMAL LOW (ref 12.0–15.0)
MCH: 32.8 pg (ref 26.0–34.0)
MCHC: 31.9 g/dL (ref 30.0–36.0)
MCV: 102.9 fL — ABNORMAL HIGH (ref 78.0–100.0)
Platelets: 144 10*3/uL — ABNORMAL LOW (ref 150–400)
RBC: 3.08 MIL/uL — ABNORMAL LOW (ref 3.87–5.11)
RDW: 18 % — ABNORMAL HIGH (ref 11.5–15.5)
WBC: 1.1 10*3/uL — CL (ref 4.0–10.5)

## 2010-03-30 LAB — VANCOMYCIN, RANDOM: Vancomycin Rm: 9.7 ug/mL

## 2010-03-30 LAB — HIV ANTIBODY (ROUTINE TESTING W REFLEX): HIV: REACTIVE — AB

## 2010-03-30 LAB — DIFFERENTIAL
Band Neutrophils: 0 % (ref 0–10)
Basophils Absolute: 0 10*3/uL (ref 0.0–0.1)
Basophils Relative: 0 % (ref 0–1)
Blasts: 0 %
Eosinophils Absolute: 0.1 10*3/uL (ref 0.0–0.7)
Eosinophils Relative: 8 % — ABNORMAL HIGH (ref 0–5)
Lymphocytes Relative: 48 % — ABNORMAL HIGH (ref 12–46)
Lymphs Abs: 0.5 10*3/uL — ABNORMAL LOW (ref 0.7–4.0)
Metamyelocytes Relative: 0 %
Monocytes Absolute: 0.4 10*3/uL (ref 0.1–1.0)
Monocytes Relative: 36 % — ABNORMAL HIGH (ref 3–12)
Myelocytes: 0 %
Neutro Abs: 0.1 10*3/uL — ABNORMAL LOW (ref 1.7–7.7)
Neutrophils Relative %: 8 % — ABNORMAL LOW (ref 43–77)
Promyelocytes Absolute: 0 %
nRBC: 0 /100 WBC

## 2010-03-30 LAB — RENAL FUNCTION PANEL
Albumin: 1.7 g/dL — ABNORMAL LOW (ref 3.5–5.2)
BUN: 45 mg/dL — ABNORMAL HIGH (ref 6–23)
CO2: 24 mEq/L (ref 19–32)
Calcium: 8.1 mg/dL — ABNORMAL LOW (ref 8.4–10.5)
Chloride: 94 mEq/L — ABNORMAL LOW (ref 96–112)
Creatinine, Ser: 7.01 mg/dL — ABNORMAL HIGH (ref 0.4–1.2)
GFR calc Af Amer: 7 mL/min — ABNORMAL LOW (ref >60)
GFR calc non Af Amer: 6 mL/min — ABNORMAL LOW (ref >60)
Glucose, Bld: 75 mg/dL (ref 70–99)
Phosphorus: 3.7 mg/dL (ref 2.3–4.6)
Potassium: 3.4 mEq/L — ABNORMAL LOW (ref 3.5–5.1)
Sodium: 126 mEq/L — ABNORMAL LOW (ref 135–145)

## 2010-03-30 MED ORDER — IOHEXOL 300 MG/ML  SOLN: 100.0000 mL | Freq: Once | INTRAMUSCULAR | Status: AC | PRN

## 2010-03-31 ENCOUNTER — Inpatient Hospital Stay (HOSPITAL_COMMUNITY): Payer: Medicaid Other

## 2010-03-31 DIAGNOSIS — A419 Sepsis, unspecified organism: Secondary | ICD-10-CM

## 2010-03-31 LAB — COMPREHENSIVE METABOLIC PANEL
ALT: <8 U/L (ref 0–35)
AST: 42 U/L — ABNORMAL HIGH (ref 0–37)
Albumin: 1.4 g/dL — ABNORMAL LOW (ref 3.5–5.2)
Alkaline Phosphatase: 90 U/L (ref 39–117)
BUN: 17 mg/dL (ref 6–23)
CO2: 28 mEq/L (ref 19–32)
Calcium: 7.5 mg/dL — ABNORMAL LOW (ref 8.4–10.5)
Chloride: 99 mEq/L (ref 96–112)
Creatinine, Ser: 3.61 mg/dL — ABNORMAL HIGH (ref 0.4–1.2)
GFR calc Af Amer: 16 mL/min — ABNORMAL LOW (ref >60)
GFR calc non Af Amer: 13 mL/min — ABNORMAL LOW (ref >60)
Glucose, Bld: 101 mg/dL — ABNORMAL HIGH (ref 70–99)
Potassium: 3.3 mEq/L — ABNORMAL LOW (ref 3.5–5.1)
Sodium: 133 mEq/L — ABNORMAL LOW (ref 135–145)
Total Bilirubin: 1.2 mg/dL (ref 0.3–1.2)
Total Protein: 7.2 g/dL (ref 6.0–8.3)

## 2010-03-31 LAB — CBC
HCT: 27.2 % — ABNORMAL LOW (ref 36.0–46.0)
Hemoglobin: 8.8 g/dL — ABNORMAL LOW (ref 12.0–15.0)
MCH: 33.2 pg (ref 26.0–34.0)
MCHC: 32.4 g/dL (ref 30.0–36.0)
MCV: 102.6 fL — ABNORMAL HIGH (ref 78.0–100.0)
Platelets: 129 10*3/uL — ABNORMAL LOW (ref 150–400)
RBC: 2.65 MIL/uL — ABNORMAL LOW (ref 3.87–5.11)
RDW: 18.1 % — ABNORMAL HIGH (ref 11.5–15.5)
WBC: 0.7 10*3/uL — CL (ref 4.0–10.5)

## 2010-03-31 LAB — DIFFERENTIAL
Band Neutrophils: 0 % (ref 0–10)
Basophils Relative: 0 % (ref 0–1)
Eosinophils Relative: 0 % (ref 0–5)
Lymphocytes Relative: 0 % — ABNORMAL LOW (ref 12–46)
Monocytes Relative: 0 % — ABNORMAL LOW (ref 3–12)
Neutrophils Relative %: 0 % — ABNORMAL LOW (ref 43–77)

## 2010-03-31 LAB — APTT: aPTT: 40 seconds — ABNORMAL HIGH (ref 24–37)

## 2010-03-31 LAB — TYPE AND SCREEN
ABO/RH(D): O POS
Antibody Screen: NEGATIVE

## 2010-03-31 LAB — CARBOXYHEMOGLOBIN
Carboxyhemoglobin: 1.8 % — ABNORMAL HIGH (ref 0.5–1.5)
Methemoglobin: 0.3 % (ref 0.0–1.5)
O2 Saturation: 72.9 %
Total hemoglobin: 9.8 g/dL — ABNORMAL LOW (ref 12.5–16.0)

## 2010-03-31 LAB — CLOSTRIDIUM DIFFICILE BY PCR

## 2010-03-31 LAB — POCT I-STAT 3, ART BLOOD GAS (G3+)
Acid-Base Excess: 4 mmol/L — ABNORMAL HIGH (ref 0.0–2.0)
Bicarbonate: 29.8 mEq/L — ABNORMAL HIGH (ref 20.0–24.0)
O2 Saturation: 99 %
Patient temperature: 98.6
TCO2: 31 mmol/L (ref 0–100)
pCO2 arterial: 47.4 mmHg — ABNORMAL HIGH (ref 35.0–45.0)
pH, Arterial: 7.407 — ABNORMAL HIGH (ref 7.350–7.400)
pO2, Arterial: 121 mmHg — ABNORMAL HIGH (ref 80.0–100.0)

## 2010-03-31 LAB — CARDIAC PANEL(CRET KIN+CKTOT+MB+TROPI)
CK, MB: 2.8 ng/mL (ref 0.3–4.0)
Relative Index: 1.1 (ref 0.0–2.5)
Total CK: 260 U/L — ABNORMAL HIGH (ref 7–177)
Troponin I: 0.19 ng/mL — ABNORMAL HIGH (ref 0.00–0.06)

## 2010-03-31 LAB — D-DIMER, QUANTITATIVE (NOT AT ARMC): D-Dimer, Quant: 2.78 ug/mL-FEU — ABNORMAL HIGH (ref 0.00–0.48)

## 2010-03-31 LAB — MRSA PCR SCREENING: MRSA by PCR: NEGATIVE

## 2010-03-31 LAB — PROTIME-INR
INR: 1.27 (ref 0.00–1.49)
Prothrombin Time: 16.1 seconds — ABNORMAL HIGH (ref 11.6–15.2)

## 2010-03-31 LAB — LACTATE DEHYDROGENASE: LDH: 213 U/L (ref 94–250)

## 2010-03-31 LAB — LACTIC ACID, PLASMA: Lactic Acid, Venous: 1.7 mmol/L (ref 0.5–2.2)

## 2010-04-01 DIAGNOSIS — R6521 Severe sepsis with septic shock: Secondary | ICD-10-CM

## 2010-04-01 DIAGNOSIS — I959 Hypotension, unspecified: Secondary | ICD-10-CM

## 2010-04-01 DIAGNOSIS — A419 Sepsis, unspecified organism: Secondary | ICD-10-CM

## 2010-04-01 DIAGNOSIS — B20 Human immunodeficiency virus [HIV] disease: Secondary | ICD-10-CM

## 2010-04-01 DIAGNOSIS — N186 End stage renal disease: Secondary | ICD-10-CM

## 2010-04-01 LAB — CBC
HCT: 30.5 % — ABNORMAL LOW (ref 36.0–46.0)
HCT: 34.8 % — ABNORMAL LOW (ref 36.0–46.0)
Hemoglobin: 9.6 g/dL — ABNORMAL LOW (ref 12.0–15.0)
Hemoglobin: 11.4 g/dL — ABNORMAL LOW (ref 12.0–15.0)
MCH: 32.9 pg (ref 26.0–34.0)
MCHC: 31.5 g/dL (ref 30.0–36.0)
MCHC: 32.8 g/dL (ref 30.0–36.0)
MCV: 104.5 fL — ABNORMAL HIGH (ref 78.0–100.0)
MCV: 102.9 fL — ABNORMAL HIGH (ref 78.0–100.0)
Platelets: 166 10*3/uL (ref 150–400)
Platelets: 134 10*3/uL — ABNORMAL LOW (ref 150–400)
RBC: 2.92 MIL/uL — ABNORMAL LOW (ref 3.87–5.11)
RBC: 3.38 MIL/uL — ABNORMAL LOW (ref 3.87–5.11)
RDW: 19.1 % — ABNORMAL HIGH (ref 11.5–15.5)
RDW: 16.6 % — ABNORMAL HIGH (ref 11.5–15.5)
WBC: 3.2 10*3/uL — ABNORMAL LOW (ref 4.0–10.5)
WBC: 3.7 10*3/uL — ABNORMAL LOW (ref 4.0–10.5)

## 2010-04-01 LAB — DIFFERENTIAL
Basophils Absolute: 0 10*3/uL (ref 0.0–0.1)
Basophils Absolute: 0 10*3/uL (ref 0.0–0.1)
Basophils Relative: 1 % (ref 0–1)
Basophils Relative: 1 % (ref 0–1)
Eosinophils Absolute: 0.8 10*3/uL — ABNORMAL HIGH (ref 0.0–0.7)
Eosinophils Absolute: 0 10*3/uL (ref 0.0–0.7)
Eosinophils Relative: 25 % — ABNORMAL HIGH (ref 0–5)
Eosinophils Relative: 0 % (ref 0–5)
Lymphocytes Relative: 14 % (ref 12–46)
Lymphocytes Relative: 31 % (ref 12–46)
Lymphs Abs: 0.4 10*3/uL — ABNORMAL LOW (ref 0.7–4.0)
Lymphs Abs: 1.2 10*3/uL (ref 0.7–4.0)
Monocytes Absolute: 0.9 10*3/uL (ref 0.1–1.0)
Monocytes Absolute: 0.5 10*3/uL (ref 0.1–1.0)
Monocytes Relative: 29 % — ABNORMAL HIGH (ref 3–12)
Monocytes Relative: 14 % — ABNORMAL HIGH (ref 3–12)
Neutro Abs: 1.1 10*3/uL — ABNORMAL LOW (ref 1.7–7.7)
Neutro Abs: 2 10*3/uL (ref 1.7–7.7)
Neutrophils Relative %: 31 % — ABNORMAL LOW (ref 43–77)
Neutrophils Relative %: 54 % (ref 43–77)

## 2010-04-01 LAB — COMPREHENSIVE METABOLIC PANEL
ALT: <8 U/L (ref 0–35)
AST: 37 U/L (ref 0–37)
Albumin: 1.5 g/dL — ABNORMAL LOW (ref 3.5–5.2)
Alkaline Phosphatase: 91 U/L (ref 39–117)
BUN: 30 mg/dL — ABNORMAL HIGH (ref 6–23)
CO2: 26 mEq/L (ref 19–32)
Calcium: 8.4 mg/dL (ref 8.4–10.5)
Chloride: 100 mEq/L (ref 96–112)
Creatinine, Ser: 4.62 mg/dL — ABNORMAL HIGH (ref 0.4–1.2)
GFR calc Af Amer: 12 mL/min — ABNORMAL LOW (ref >60)
GFR calc non Af Amer: 10 mL/min — ABNORMAL LOW (ref >60)
Glucose, Bld: 94 mg/dL (ref 70–99)
Potassium: 3.4 mEq/L — ABNORMAL LOW (ref 3.5–5.1)
Sodium: 133 mEq/L — ABNORMAL LOW (ref 135–145)
Total Bilirubin: 1.7 mg/dL — ABNORMAL HIGH (ref 0.3–1.2)
Total Protein: 7.7 g/dL (ref 6.0–8.3)

## 2010-04-01 LAB — BASIC METABOLIC PANEL
BUN: 40 mg/dL — ABNORMAL HIGH (ref 6–23)
CO2: 29 mEq/L (ref 19–32)
Calcium: 8.2 mg/dL — ABNORMAL LOW (ref 8.4–10.5)
Chloride: 95 mEq/L — ABNORMAL LOW (ref 96–112)
Creatinine, Ser: 9.56 mg/dL — ABNORMAL HIGH (ref 0.4–1.2)
GFR calc Af Amer: 5 mL/min — ABNORMAL LOW (ref >60)
GFR calc non Af Amer: 4 mL/min — ABNORMAL LOW (ref >60)
Glucose, Bld: 91 mg/dL (ref 70–99)
Potassium: 3.5 mEq/L (ref 3.5–5.1)
Sodium: 130 mEq/L — ABNORMAL LOW (ref 135–145)

## 2010-04-01 LAB — T-HELPER CELLS (CD4) COUNT (NOT AT ARMC)
CD4 % Helper T Cell: 21 % — ABNORMAL LOW (ref 33–55)
CD4 T Cell Abs: 90 uL — ABNORMAL LOW (ref 400–2700)

## 2010-04-01 LAB — PHOSPHORUS: Phosphorus: 2 mg/dL — ABNORMAL LOW (ref 2.3–4.6)

## 2010-04-02 ENCOUNTER — Inpatient Hospital Stay (HOSPITAL_COMMUNITY): Payer: Medicaid Other

## 2010-04-02 DIAGNOSIS — B20 Human immunodeficiency virus [HIV] disease: Secondary | ICD-10-CM

## 2010-04-02 DIAGNOSIS — R509 Fever, unspecified: Secondary | ICD-10-CM

## 2010-04-02 DIAGNOSIS — N186 End stage renal disease: Secondary | ICD-10-CM

## 2010-04-02 DIAGNOSIS — I959 Hypotension, unspecified: Secondary | ICD-10-CM

## 2010-04-02 DIAGNOSIS — R6521 Severe sepsis with septic shock: Secondary | ICD-10-CM

## 2010-04-02 DIAGNOSIS — A419 Sepsis, unspecified organism: Secondary | ICD-10-CM

## 2010-04-02 LAB — CBC
HCT: 29.2 % — ABNORMAL LOW (ref 36.0–46.0)
Hemoglobin: 9.3 g/dL — ABNORMAL LOW (ref 12.0–15.0)
MCH: 32.9 pg (ref 26.0–34.0)
MCHC: 31.8 g/dL (ref 30.0–36.0)
MCV: 103.2 fL — ABNORMAL HIGH (ref 78.0–100.0)
Platelets: 158 10*3/uL (ref 150–400)
RBC: 2.83 MIL/uL — ABNORMAL LOW (ref 3.87–5.11)
RDW: 19.1 % — ABNORMAL HIGH (ref 11.5–15.5)
WBC: 4.7 10*3/uL (ref 4.0–10.5)

## 2010-04-02 LAB — DIFFERENTIAL
Basophils Absolute: 0 10*3/uL (ref 0.0–0.1)
Basophils Relative: 1 % (ref 0–1)
Eosinophils Absolute: 0.8 10*3/uL — ABNORMAL HIGH (ref 0.0–0.7)
Eosinophils Relative: 16 % — ABNORMAL HIGH (ref 0–5)
Lymphocytes Relative: 12 % (ref 12–46)
Lymphs Abs: 0.6 10*3/uL — ABNORMAL LOW (ref 0.7–4.0)
Monocytes Absolute: 0.8 10*3/uL (ref 0.1–1.0)
Monocytes Relative: 18 % — ABNORMAL HIGH (ref 3–12)
Neutro Abs: 2.5 10*3/uL (ref 1.7–7.7)
Neutrophils Relative %: 53 % (ref 43–77)

## 2010-04-02 LAB — COMPREHENSIVE METABOLIC PANEL
ALT: <8 U/L (ref 0–35)
AST: 27 U/L (ref 0–37)
Albumin: 1.5 g/dL — ABNORMAL LOW (ref 3.5–5.2)
Alkaline Phosphatase: 88 U/L (ref 39–117)
BUN: 42 mg/dL — ABNORMAL HIGH (ref 6–23)
CO2: 27 mEq/L (ref 19–32)
Calcium: 8.8 mg/dL (ref 8.4–10.5)
Chloride: 101 mEq/L (ref 96–112)
Creatinine, Ser: 5.68 mg/dL — ABNORMAL HIGH (ref 0.4–1.2)
GFR calc Af Amer: 9 mL/min — ABNORMAL LOW (ref >60)
GFR calc non Af Amer: 8 mL/min — ABNORMAL LOW (ref >60)
Glucose, Bld: 80 mg/dL (ref 70–99)
Potassium: 3.5 mEq/L (ref 3.5–5.1)
Sodium: 134 mEq/L — ABNORMAL LOW (ref 135–145)
Total Bilirubin: 2.3 mg/dL — ABNORMAL HIGH (ref 0.3–1.2)
Total Protein: 7.2 g/dL (ref 6.0–8.3)

## 2010-04-02 LAB — HEPATITIS B SURFACE ANTIGEN: Hepatitis B Surface Ag: NEGATIVE

## 2010-04-02 LAB — PHOSPHORUS: Phosphorus: 1.7 mg/dL — ABNORMAL LOW (ref 2.3–4.6)

## 2010-04-03 DIAGNOSIS — I959 Hypotension, unspecified: Secondary | ICD-10-CM

## 2010-04-03 LAB — CULTURE, BLOOD (ROUTINE X 2)
Culture  Setup Time: 201203222245
Culture  Setup Time: 201203222245
Culture: NO GROWTH
Culture: NO GROWTH

## 2010-04-03 LAB — CBC
HCT: 30.7 % — ABNORMAL LOW (ref 36.0–46.0)
Hemoglobin: 9.7 g/dL — ABNORMAL LOW (ref 12.0–15.0)
MCH: 33.4 pg (ref 26.0–34.0)
MCHC: 31.6 g/dL (ref 30.0–36.0)
MCV: 105.9 fL — ABNORMAL HIGH (ref 78.0–100.0)
Platelets: 161 10*3/uL (ref 150–400)
RBC: 2.9 MIL/uL — ABNORMAL LOW (ref 3.87–5.11)
RDW: 19.7 % — ABNORMAL HIGH (ref 11.5–15.5)
WBC: 9.6 10*3/uL (ref 4.0–10.5)

## 2010-04-03 LAB — DIFFERENTIAL
Basophils Absolute: 0 10*3/uL (ref 0.0–0.1)
Basophils Relative: 0 % (ref 0–1)
Eosinophils Absolute: 0.7 10*3/uL (ref 0.0–0.7)
Eosinophils Relative: 9 % — ABNORMAL HIGH (ref 0–5)
Lymphocytes Relative: 10 % — ABNORMAL LOW (ref 12–46)
Lymphs Abs: 0.8 10*3/uL (ref 0.7–4.0)
Monocytes Absolute: 0.9 10*3/uL (ref 0.1–1.0)
Monocytes Relative: 12 % (ref 3–12)
Neutro Abs: 5.5 10*3/uL (ref 1.7–7.7)
Neutrophils Relative %: 69 % (ref 43–77)

## 2010-04-03 LAB — RENAL FUNCTION PANEL
Albumin: 1.6 g/dL — ABNORMAL LOW (ref 3.5–5.2)
BUN: 19 mg/dL (ref 6–23)
CO2: 30 mEq/L (ref 19–32)
Calcium: 8.6 mg/dL (ref 8.4–10.5)
Chloride: 102 mEq/L (ref 96–112)
Creatinine, Ser: 3.59 mg/dL — ABNORMAL HIGH (ref 0.4–1.2)
GFR calc Af Amer: 16 mL/min — ABNORMAL LOW (ref >60)
GFR calc non Af Amer: 13 mL/min — ABNORMAL LOW (ref >60)
Glucose, Bld: 81 mg/dL (ref 70–99)
Phosphorus: 1.7 mg/dL — ABNORMAL LOW (ref 2.3–4.6)
Potassium: 3.8 mEq/L (ref 3.5–5.1)
Sodium: 136 mEq/L (ref 135–145)

## 2010-04-03 LAB — MAGNESIUM: Magnesium: 1.8 mg/dL (ref 1.5–2.5)

## 2010-04-03 NOTE — Consult Note (Signed)
[name], [name]                 ACCOUNT NO.:  [id number]  MEDICAL RECORD NO.:  [id number]           PATIENT TYPE:  LOCATION:                                 FACILITY:  PHYSICIAN:  Alcide Evener, MD  DATE OF BIRTH:  November 18, 1955  DATE OF CONSULTATION: DATE OF DISCHARGE:                                CONSULTATION   REQUESTING PHYSICIAN:  Oval Linsey, MD  REASON FOR INFECTIOUS DISEASE CONSULTATION:  The patient with HIV, persistent fevers, and SIRS physiology.  HISTORY OF PRESENT ILLNESS:  [name] is a 55 year old African American lady with HIV and AIDS who has been intermittently noncompliant with her antiretroviral therapy.  She is also on hemodialysis via a graft in her upper arm.  She has a history of recurrent methicillin-sensitive Staph aureus bacteremias.  She had relative claims that she was feeling well until this past week when she stepped out of her bathtub onto a towel and slipped and fell and hurt her foot.  Afterwards, she began to feel unwell and was having fevers and in fact was seen in Dialysis and was apparently febrile during dialysis.  She claims to [place] that she had blood cultures drawn there, although upon calling the dialysis center they denied that blood cultures were given, but they do endorse that she was given vancomycin with hemodialysis.  She was then admitted to teaching service on March 29, 2010.  Blood cultures were drawn on admission.  She was admitted to the step-down unit.  She was placed on broad-spectrum antibiotics including vancomycin and cefepime.  She was found to still be persistently pancytopenic with neutropenia with low blood pressures and therefore the broad-spectrum antibiotics were used.  Ultimately, she was broadened out even further with the addition of anaerobic coverage with Zosyn having been added to her vancomycin.  Her pressures have improved since her stay in the ICU, although she is persistently febrile.  There is  not yet clear source of fever, although she certainly could have an endovascular infection that could be mass by the antibiotics that were given with hemodialysis.  With regard to her HIV therapy, I get mixed stories from her.  She claims that she tolerates her medicines fine, and at one point she said she was taking her medicines at home, then she later told me that she was only taking the liquid Epivir and she suspected her daughters of throwing away her antiviral medicines and her daughters who live with her do not know that she has HIV and did not know that these medicines were important for her to be taking.  She does deny having any trouble with tolerance of the medications and asked me if she could be placed back on the medications.  PAST MEDICAL HISTORY: 1. Recurrent methicillin-sensitive Staph aureus bacteremias. 2. End-stage renal disease on hemodialysis. 3. HIV with reported last CD-4 count of 300, although currently     pancytopenic and off antiviral therapy. 4. Hypertension. 5. Pancytopenia thought to be due to HIV directly. 6. HV nephropathy. 7. Heart failure with ejection fraction 25%. 8. Neutropenia as described above.  PAST SURGICAL  HISTORY:  Placement of graft.  FAMILY HISTORY:  Some history of alcoholism in her family and diabetes in a cousin.  SOCIAL HISTORY:  The patient lives at home with daughters who do not know about her HIV status.  She denies tobacco, alcohol, or illicit drug use.  REVIEW OF SYSTEMS:  As described in history of present illness, otherwise 12-point review of systems pertinent for the pain in her leg, otherwise negative on 12-point review.  PHYSICAL EXAMINATION:  VITAL SIGNS:  Temperature maximum last 24 hours was 103.1, temperature current is 102.5, blood pressure 90/41, pulse 101, respirations 20, pulse ox 98% on room air. GENERAL:  Pleasant lady, lying in bed, eating breakfast. HEENT:  Normocephalic, atraumatic.  Pupils equal, round,  and reactive to light.  Sclerae icteric.  Oropharynx clear. NECK:  Supple. CARDIOVASCULAR:  Slightly tachycardic but no murmurs, gallops, or rubs heard. LUNGS:  Clear to auscultation bilaterally. ABDOMEN:  Soft, minimal tenderness in her suprapubic area.  No rebound. Positive bowel sounds. EXTREMITIES:  She has no evidence of any fluctuance.  Her leg is not tender and not especially erythematous either.  I cannot discern any deformities where she fell. SKIN:  Her graft site does not appear grossly infected to my examination.  LABORATORY DATA:  Hip films on admission showed no fracture on lumbar spine films and no acute findings.  Chest x-ray showed cardiomegaly with low lung volumes.  CBC differential, white count of 1.2, hemoglobin 9.9, platelets 131,000.  Renal function panel, sodium 132, potassium 3.5, chloride 98, bicarb 29, BUN and creatinine 28 and 5.26, glucose 83, albumin 1.8.  MRSA by PCR which was done on antibiotics is negative. Procalcitonin was 14.46.  Lactic acid done on March 28, 2010, is 1.7. Most recent HIV RNA was 894,000 on February 23, 2010.  CD-4 count in February was 300 with present CD-4 count of 20.  MICROBIOLOGICAL DATA:  Blood cultures on March 28, 2010, was 2/2, no growth.  Blood culture from February 25, 2010, was 2/2 with no growth. Blood cultures on February 23, 2010, were methicillin-sensitive Staph aureus.  For further data, please see the chart and my prior dictated consult note from September.  IMPRESSION AND RECOMMENDATIONS:  This is a 55 year old African American lady with human immunodeficiency virus, pancytopenia, and neutropenia and fevers and sepsis physiology. 1. Fevers:  I am worried she may have a blood stream infection.  She     has had a history of methicillin-sensitive Staph aureus bacteremias     on several occasions including her admission during February.  I     would like to make sure that the blood cultures were not sent from      the dialysis center certainly if they warrant its a pity because     that may be completely obscuring the diagnosis here.  I do worry     she may have recurrence of her Staph aureus bacteremia again and I     worry about a nidus of infection.  She had a TEE in February which     was negative, but she could have some other nidus of infection     including an epidural abscess.  At present, the antibiotics she is     on are certainly reasonable for a septic patient and also one that     is neutropenic with the vancomycin and Zosyn.  I would get a CT of     her chest, abdomen, and pelvis to look for occult  abscess.  I would     try and make sure that she definitely did not get a blood culture     done at the dialysis center, and if the workup is still not     conclusive I would then start considering looking for metastatic     sites of infections of her prior MSSA bacteremia, namely looking at     her MRI of her lumbosacral area without contrast.  The CT scan     itself maybe revealing about this as well. 2. Pancytopenia.  It is likely related to her HIV.  We could get an     AFB blood culture.  Her CD-4 count was actually fairly high despite     her pancytopenia in February which would make M. avium infection     unlikely, but we can check on AFB blood culture in case her counts     have plummeted since then. 3. Human immunodeficiency virus.  Odd story that she gives me about     her daughters throwing away her medications.  We can reinstitute     her antiviral therapy here, but she is going to need some help to     make sure she is getting her medicine.  We could employ [employer]     from [facility] to help if the patient is open     to this.  I would hold off on PCP for prophylaxis at this point in     time given the patient is pancytopenic.  We could go back to     Bactrim three times weekly.  I would also check a G6PD in case you     might want to put her on dapsone  instead.  With regard to the     neutropenia, I would start her on Neupogen to boost her neutrophil     counts until she can get suppressed on her antiviral regimen.  Thank you for the infectious disease consultation.     Alcide Evener, MD     CV/MEDQ  D:  03/30/2010  T:  03/31/2010  Job:  [id number]  cc:   Donato Heinz, M.D.  Electronically Signed by Rhina Brackett DAM MD on 04/03/2010 12:33:58 PM

## 2010-04-04 ENCOUNTER — Encounter: Payer: Self-pay | Admitting: Ophthalmology

## 2010-04-04 ENCOUNTER — Inpatient Hospital Stay (HOSPITAL_COMMUNITY): Payer: Medicaid Other

## 2010-04-04 DIAGNOSIS — R8271 Bacteriuria: Secondary | ICD-10-CM

## 2010-04-04 DIAGNOSIS — A498 Other bacterial infections of unspecified site: Secondary | ICD-10-CM

## 2010-04-04 HISTORY — DX: Bacteriuria: R82.71

## 2010-04-04 HISTORY — DX: Other bacterial infections of unspecified site: A49.8

## 2010-04-04 LAB — RENAL FUNCTION PANEL
Albumin: 1.6 g/dL — ABNORMAL LOW (ref 3.5–5.2)
BUN: 26 mg/dL — ABNORMAL HIGH (ref 6–23)
CO2: 26 mEq/L (ref 19–32)
Calcium: 8.9 mg/dL (ref 8.4–10.5)
Chloride: 101 mEq/L (ref 96–112)
Creatinine, Ser: 5 mg/dL — ABNORMAL HIGH (ref 0.4–1.2)
GFR calc Af Amer: 11 mL/min — ABNORMAL LOW (ref >60)
GFR calc non Af Amer: 9 mL/min — ABNORMAL LOW (ref >60)
Glucose, Bld: 92 mg/dL (ref 70–99)
Phosphorus: 2.4 mg/dL (ref 2.3–4.6)
Potassium: 4 mEq/L (ref 3.5–5.1)
Sodium: 134 mEq/L — ABNORMAL LOW (ref 135–145)

## 2010-04-04 LAB — URINE CULTURE
Colony Count: >=100000
Culture  Setup Time: 201203251729

## 2010-04-04 LAB — HIV 1/2 CONFIRMATION
HIV-1 antibody: POSITIVE
HIV-2 Ab: NEGATIVE

## 2010-04-04 LAB — CBC
HCT: 31.8 % — ABNORMAL LOW (ref 36.0–46.0)
Hemoglobin: 10.1 g/dL — ABNORMAL LOW (ref 12.0–15.0)
MCH: 33.4 pg (ref 26.0–34.0)
MCHC: 31.8 g/dL (ref 30.0–36.0)
MCV: 105.3 fL — ABNORMAL HIGH (ref 78.0–100.0)
Platelets: 213 10*3/uL (ref 150–400)
RBC: 3.02 MIL/uL — ABNORMAL LOW (ref 3.87–5.11)
RDW: 19.3 % — ABNORMAL HIGH (ref 11.5–15.5)
WBC: 11.1 10*3/uL — ABNORMAL HIGH (ref 4.0–10.5)

## 2010-04-04 NOTE — Progress Notes (Signed)
  Subjective:    Patient ID: [name], female    DOB: May 14, 1955, 55 y.o.   MRN: [id number]  HPI For complete hospital discharge summery and follow up, please see Echart.   For medications at time of discharge, see updated medications.    Review of Systems     Objective:   Physical Exam        Assessment & Plan:

## 2010-04-05 LAB — CULTURE, BLOOD (ROUTINE X 2)
Culture  Setup Time: 201203241933
Culture  Setup Time: 201203241933
Culture: NO GROWTH
Culture: NO GROWTH

## 2010-04-10 LAB — HIV-1 GENOTYPR PLUS

## 2010-04-11 LAB — T-HELPER CELL (CD4) - (RCID CLINIC ONLY)
CD4 % Helper T Cell: 21 % — ABNORMAL LOW (ref 33–55)
CD4 T Cell Abs: 380 uL — ABNORMAL LOW (ref 400–2700)

## 2010-04-15 ENCOUNTER — Ambulatory Visit: Payer: Medicaid Other | Admitting: Internal Medicine

## 2010-04-15 LAB — POCT I-STAT 4, (NA,K, GLUC, HGB,HCT)
Glucose, Bld: 89 mg/dL (ref 70–99)
HCT: 40 % (ref 36.0–46.0)
Hemoglobin: 13.6 g/dL (ref 12.0–15.0)
Potassium: 3.6 mEq/L (ref 3.5–5.1)
Sodium: 135 mEq/L (ref 135–145)

## 2010-04-16 LAB — POCT I-STAT 4, (NA,K, GLUC, HGB,HCT)
Glucose, Bld: 79 mg/dL (ref 70–99)
HCT: 41 % (ref 36.0–46.0)
Hemoglobin: 13.9 g/dL (ref 12.0–15.0)
Potassium: 3.8 mEq/L (ref 3.5–5.1)
Sodium: 137 mEq/L (ref 135–145)

## 2010-04-22 LAB — BASIC METABOLIC PANEL
BUN: 33 mg/dL — ABNORMAL HIGH (ref 6–23)
BUN: 36 mg/dL — ABNORMAL HIGH (ref 6–23)
CO2: 23 mEq/L (ref 19–32)
CO2: 27 mEq/L (ref 19–32)
Calcium: 6.5 mg/dL — ABNORMAL LOW (ref 8.4–10.5)
Calcium: 8.2 mg/dL — ABNORMAL LOW (ref 8.4–10.5)
Chloride: 103 mEq/L (ref 96–112)
Chloride: 96 mEq/L (ref 96–112)
Creatinine, Ser: 4.82 mg/dL — ABNORMAL HIGH (ref 0.4–1.2)
Creatinine, Ser: 4.91 mg/dL — ABNORMAL HIGH (ref 0.4–1.2)
GFR calc Af Amer: 11 mL/min — ABNORMAL LOW (ref >60)
GFR calc Af Amer: 12 mL/min — ABNORMAL LOW (ref >60)
GFR calc non Af Amer: 10 mL/min — ABNORMAL LOW (ref >60)
GFR calc non Af Amer: 9 mL/min — ABNORMAL LOW (ref >60)
Glucose, Bld: 84 mg/dL (ref 70–99)
Glucose, Bld: 94 mg/dL (ref 70–99)
Potassium: 3.8 mEq/L (ref 3.5–5.1)
Potassium: 5.2 mEq/L — ABNORMAL HIGH (ref 3.5–5.1)
Sodium: 132 mEq/L — ABNORMAL LOW (ref 135–145)
Sodium: 134 mEq/L — ABNORMAL LOW (ref 135–145)

## 2010-04-22 LAB — RENAL FUNCTION PANEL
Albumin: 1.9 g/dL — ABNORMAL LOW (ref 3.5–5.2)
Albumin: 1.6 g/dL — ABNORMAL LOW (ref 3.5–5.2)
Albumin: 1.7 g/dL — ABNORMAL LOW (ref 3.5–5.2)
BUN: 17 mg/dL (ref 6–23)
BUN: 26 mg/dL — ABNORMAL HIGH (ref 6–23)
BUN: 26 mg/dL — ABNORMAL HIGH (ref 6–23)
CO2: 24 mEq/L (ref 19–32)
CO2: 26 mEq/L (ref 19–32)
CO2: 26 mEq/L (ref 19–32)
Calcium: 7.7 mg/dL — ABNORMAL LOW (ref 8.4–10.5)
Calcium: 8 mg/dL — ABNORMAL LOW (ref 8.4–10.5)
Calcium: 8.7 mg/dL (ref 8.4–10.5)
Chloride: 99 mEq/L (ref 96–112)
Chloride: 101 mEq/L (ref 96–112)
Chloride: 98 mEq/L (ref 96–112)
Creatinine, Ser: 3.62 mg/dL — ABNORMAL HIGH (ref 0.4–1.2)
Creatinine, Ser: 4.8 mg/dL — ABNORMAL HIGH (ref 0.4–1.2)
Creatinine, Ser: 6.82 mg/dL — ABNORMAL HIGH (ref 0.4–1.2)
GFR calc Af Amer: 16 mL/min — ABNORMAL LOW (ref >60)
GFR calc Af Amer: 12 mL/min — ABNORMAL LOW (ref >60)
GFR calc Af Amer: 8 mL/min — ABNORMAL LOW (ref >60)
GFR calc non Af Amer: 13 mL/min — ABNORMAL LOW (ref >60)
GFR calc non Af Amer: 10 mL/min — ABNORMAL LOW (ref >60)
GFR calc non Af Amer: 6 mL/min — ABNORMAL LOW (ref >60)
Glucose, Bld: 83 mg/dL (ref 70–99)
Glucose, Bld: 72 mg/dL (ref 70–99)
Glucose, Bld: 79 mg/dL (ref 70–99)
Phosphorus: 3.3 mg/dL (ref 2.3–4.6)
Phosphorus: 3.1 mg/dL (ref 2.3–4.6)
Phosphorus: 3.4 mg/dL (ref 2.3–4.6)
Potassium: 3.6 mEq/L (ref 3.5–5.1)
Potassium: 3.4 mEq/L — ABNORMAL LOW (ref 3.5–5.1)
Potassium: 3.6 mEq/L (ref 3.5–5.1)
Sodium: 133 mEq/L — ABNORMAL LOW (ref 135–145)
Sodium: 132 mEq/L — ABNORMAL LOW (ref 135–145)
Sodium: 135 mEq/L (ref 135–145)

## 2010-04-22 LAB — CBC
HCT: 31 % — ABNORMAL LOW (ref 36.0–46.0)
HCT: 34.4 % — ABNORMAL LOW (ref 36.0–46.0)
HCT: 30.4 % — ABNORMAL LOW (ref 36.0–46.0)
Hemoglobin: 10 g/dL — ABNORMAL LOW (ref 12.0–15.0)
Hemoglobin: 11.1 g/dL — ABNORMAL LOW (ref 12.0–15.0)
Hemoglobin: 9.7 g/dL — ABNORMAL LOW (ref 12.0–15.0)
MCHC: 32.3 g/dL (ref 30.0–36.0)
MCHC: 32.4 g/dL (ref 30.0–36.0)
MCHC: 32 g/dL (ref 30.0–36.0)
MCV: 101.9 fL — ABNORMAL HIGH (ref 78.0–100.0)
MCV: 103.1 fL — ABNORMAL HIGH (ref 78.0–100.0)
MCV: 103 fL — ABNORMAL HIGH (ref 78.0–100.0)
Platelets: 205 10*3/uL (ref 150–400)
Platelets: 224 10*3/uL (ref 150–400)
Platelets: 424 10*3/uL — ABNORMAL HIGH (ref 150–400)
RBC: 3.01 MIL/uL — ABNORMAL LOW (ref 3.87–5.11)
RBC: 3.37 MIL/uL — ABNORMAL LOW (ref 3.87–5.11)
RBC: 2.95 MIL/uL — ABNORMAL LOW (ref 3.87–5.11)
RDW: 17.8 % — ABNORMAL HIGH (ref 11.5–15.5)
RDW: 17.9 % — ABNORMAL HIGH (ref 11.5–15.5)
RDW: 17 % — ABNORMAL HIGH (ref 11.5–15.5)
WBC: 10.4 10*3/uL (ref 4.0–10.5)
WBC: 6.2 10*3/uL (ref 4.0–10.5)
WBC: 5 10*3/uL (ref 4.0–10.5)

## 2010-04-22 LAB — DIFFERENTIAL
Basophils Absolute: 0 10*3/uL (ref 0.0–0.1)
Basophils Relative: 0 % (ref 0–1)
Eosinophils Absolute: 0 10*3/uL (ref 0.0–0.7)
Eosinophils Relative: 0 % (ref 0–5)
Lymphocytes Relative: 13 % (ref 12–46)
Lymphs Abs: 0.8 10*3/uL (ref 0.7–4.0)
Monocytes Absolute: 0.5 10*3/uL (ref 0.1–1.0)
Monocytes Relative: 8 % (ref 3–12)
Neutro Abs: 4.9 10*3/uL (ref 1.7–7.7)
Neutrophils Relative %: 79 % — ABNORMAL HIGH (ref 43–77)

## 2010-04-22 LAB — HEPATITIS B SURFACE ANTIGEN
Hepatitis B Surface Ag: NEGATIVE
Hepatitis B Surface Ag: NEGATIVE

## 2010-04-22 LAB — ANAEROBIC CULTURE

## 2010-04-22 LAB — GRAM STAIN

## 2010-04-22 LAB — WOUND CULTURE

## 2010-04-22 LAB — CULTURE, BLOOD (ROUTINE X 2)
Culture: NO GROWTH
Culture: NO GROWTH

## 2010-04-22 LAB — GLUCOSE, CAPILLARY: Glucose-Capillary: 89 mg/dL (ref 70–99)

## 2010-05-13 LAB — AFB CULTURE, BLOOD

## 2010-05-20 ENCOUNTER — Encounter: Payer: Self-pay | Admitting: Infectious Disease

## 2010-05-21 NOTE — Consult Note (Signed)
[name], [name]                 ACCOUNT NO.:  [id number]   MEDICAL RECORD NO.:  [id number]          PATIENT TYPE:  INP   LOCATION:  6703                         FACILITY:  [facility]   PHYSICIAN:  [name], M.D.DATE OF BIRTH:  1955/09/28   DATE OF CONSULTATION:  10/23/2007  DATE OF DISCHARGE:                                 CONSULTATION   This is a 55 year old black female with chronic kidney disease stage V,  anemia, secondary hyperparathyroidism, and hypertension.  She also has a  relatively recent (March 2009) diagnosis of HIV, which is felt to be the  etiology of her underlying kidney disease.  She was developing  progressive disease, followed in the office by [name]  and underwent left forearm AV graft placement on October 18, 2007.  She  subsequently developed severe left arm swelling and blistering all the  way to the axilla.  She presented to the emergency department where she  was admitted by [name] for graft removal and brachial artery repair  which was done today.  Because of her of her creatinine of 6, BUN in the  80s, a PermCath was placed for initiation of hemodialysis.   The patient as previously mentioned has been followed by Dr.  Moshe [name].  Her creatinine on October 12, 2007, was 5.67 and today was  6.3.  Potassium was elevated preoperatively at 6.1, recheck was 6.4 with  hemolysis but I-STAT in the OR was 5.2 after Kayexalate.   PAST MEDICAL HISTORY:  1. CKD stage V in the setting of HIV and hypertension.  2. Hypertension.  3. HIV, diagnosed in March 2009 during hospitalization for pneumonia      (on Combivir and Sustiva and followed by [name]).  4. Secondary hyperparathyroidism, on p.o. Hectorol.  5. Anemia, on Procrit.  6. Chronic lymphedema.  7. Cardiomyopathy   She has no allergies.   Her outpatient medicines are:  1. Niferex 150 b.i.d.  2. Lasix 40 mg a day.  3. Metoprolol 50 mg b.i.d.  4. Norvasc 10 mg a day.  5.  Sustiva 600 mg a day.  6. Combivir 150/300 one b.i.d.  7. Procrit 20,000 units once a week.  8. Hectorol 1 mcg per day.   As an inpatient, she is receiving vancomycin and morphine and is status  post Kayexalate.   FAMILY HISTORY:  Positive for end-stage renal disease in both her  mother, [name] and her uncle, [name] who were on dialysis  prior to their deaths.   SOCIAL HISTORY:  The patient is unmarried.  She has 2 daughters.  She is  presently living with her sister.  She has a ninth-grade education.  She  is not working.  She says she has Medicaid and disability. Describes  herself as a little  slow   REVIEW OF SYSTEMS:  Positive for significant arm swelling, pain, and  blistering.  She has had no shortness of breath, nausea, or vomiting.  Sleeping has been okay.  She has had no chest pain or abdominal pain.  She has chronic lymphedema of the lower  extremities and states that her  swelling has been worse from time to time.  Otherwise, her review of  systems is negative.   PHYSICAL EXAMINATION:  She is somewhat sleepy postoperatively, but  oriented.  Blood pressure 140/63.  She is afebrile.  She has a new right  IJ PermCath and it is a sore over the catheter, but she has a dressing  which is intact.  Left arm is massively swollen.  The hand was actually  the only part of the upper extremity which was visible outside the  dressing which was edematous.  Fingers are slight cool, but she did have  a radial pulse.  JP drain was draining sanguineous fluid.  Cardiac  rhythm was regular.  S1 and S2.  No S3 or S4.  She has a 2/6 murmur at  the upper sternal border.  No diastolic murmur.  No pericardial rub.  Soft and nontender abdomen.  Extremities show chronic lymphedematous  changes in both left greater than right with minimal pitting.   LABORATORY DATA:  Preoperatively, hemoglobin 11.1, WBC 4800, and  platelets 180,000.  Sodium 132, potassium 6.1, chloride 104, CO2 of 23,   BUN 84, creatinine 6.36, calcium 8.9, recheck potassium was 6.4  hemolyzed and I-STAT in the OR was 5.2.   IMPRESSION:  1. Status post ligation of the left arteriovenous graft, secondary to      swelling and blistering - I presume this is a Gore-Tex reaction      (plus or minus venous hypertension - status of her central veins is      not known).  She is on vancomycin and is status post surgery.  2. End-stage renal disease.  PermCath was placed and we will initiate      hemodialysis.  The patient will be CLIP'd - once she is appproved      through the [facility] office, we can initiate outpatient hemodialysis at      discharge.  She will get her first dialysis today, short, 2 hours      at low blood flow rates.  3. Human immunodeficiency virus.  Continue medications.  4. Hypertension.  Continue medications.  5. Anemia.  Convert her weekly Procrit to Aranesp for the present time      and give with hemodialysis, the equivalent dose will be 6500 units      with each 3 times a week dialysis.  6. Secondary hyperparathyroidism - check phosphorus and continue      Hectorol with hemodialysis.  Change from po to QHD.      [name], M.D.  Electronically Signed     CBD/MEDQ  D:  10/23/2007  T:  10/24/2007  Job:  [id number]

## 2010-05-21 NOTE — Discharge Summary (Signed)
[name], [name]                 ACCOUNT NO.:  [id number]   MEDICAL RECORD NO.:  [id number]          PATIENT TYPE:  INP   LOCATION:  [id number]                         FACILITY:  [facility]   PHYSICIAN:  Sherryl Manges, M.D.  DATE OF BIRTH:  1955-02-11   DATE OF ADMISSION:  03/19/2007  DATE OF DISCHARGE:  03/27/2007                               DISCHARGE SUMMARY   PRIMARY MEDICAL DOCTOR:  Althia Forts.   DISCHARGE DIAGNOSES:  1. Community acquired pneumonia.  2. Congestive cardiomyopathy.  3. Human immunodeficiency virus, CD-4 count 310.  4. Chronic kidney disease stage IV, secondary HIV nephropathy.  5. Hypertension.  6. Chronic left lower extremity edema.  7. Anemia of chronic disease.   DISCHARGE MEDICATIONS:  1. Nu-Iron 150 mg p.o. b.i.d.  2. Avelox 400 mg p.o. daily for 5 days (from March 16, 2007).  3. Norvasc 10 mg p.o. day.  4. Lopressor 50 mg p.o. b.i.d.  5. Lasix 40 mg p.o. daily.   PROCEDURES:  1. Chest x-ray dated March 20, 2007.  This showed bilateral      infiltrates most likely due to pneumonia.  There was cardiac      enlargement with mild vascular congestion, thoracic aorta is      enlarged.  2. Renal ultrasound scan dated March 21, 2007.  This showed increasing      echogenicity of both kidneys.  Renal sizes are stable showing      increase in the right kidney compared to report of 2004.  No      definite evidence of hydronephrosis.  3. Two-view chest x-ray dated March 24, 2007.  This showed      cardiomegaly, minimal improvement in aeration with persistent      patchy infiltrate in lingular and right middle lobe.  4. A 2-D echocardiogram dated March 26, 2007.  This showed decreased      thickening of the inferior wall and posterior wall.  Tissue Doppler      data suggests some increase in LV filling pressure.  Left      ventricular size was at upper limits of normal.  Overall left      ventricular systolic function was mildly decreased.  Left      ventricular  ejection fraction was estimated range being 45-50%.      Left ventricular wall thickness was mildly increased.  There was      mild aortic valvular regurgitation.  Normal mitral valve.  Left      atrial size was at upper limits of normal.   CONSULTATIONS:  1. Dr. Salem Senate, nephrologist.  2. Dr. Corliss Parish, nephrologist.  3. Dr. Lars Mage, infectious diseases specialist.   ADMISSION HISTORY:  As in H&P notes of March 19, 2007 dictated by Dr.  Jana Hakim.  However in brief,  this is a 55 year old female, with  no significant past medical history, brought to the emergency department  by her family secondary to complaints of increased cough, chest pain and  chest congestion for the past 3 days, without fever or chills.  Her  cough was productive of  clear phlegm.  Chest x-ray demonstrated  bilateral patchy infiltrates and she was admitted for further  evaluation, investigation and management.   CLINICAL COURSE:  1. Community-acquired pneumonia.  For details of presentation, refer      to admission history above.  The patient was commenced on a      combination of intravenous Rocephin and Azithromycin,      bronchodilator nebulizers, Mucinex, oxygen supplementation, with      satisfactory clinical response.  Over the course of the next few      days of hospitalization, the patient improved clinically.      Shortness of breath significantly decreased, as did chest      congestion and cough.  Certainly, she felt considerably better and      remained afebrile.  By March 26, 2007, she had completed a 6 day      course of above mentioned antibiotics and was transitioned to a      further 7 day course of oral Avelox.   1. Human immunodeficiency virus.  The patient at the time of      presentation was noted to have renal insufficiency with a BUN of 28      and creatinine of 3.31.  Baseline creatinine was reportedly 0.9 in      June 2004.  She was managed with intravenous  fluid hydration.      However, workup was commenced for possible etiologies, including      HIV testing.  HIV test was done and returned positive, although a      Western blot was pending.  CD-4 count was done and was reduced at      310.  HIV viral load showed 17,500 copies.  PPD was placed and at      the time of this dictation, was negative.  Hepatitis B and C      serology was negative, as was RPR.  ID consultation was kindly      provided by Dr. Lars Mage who has carefully evaluated the      patient's clinical data.  He plans to start her on appropriate      antiretroviral medication on an outpatient basis.  She has      therefore been referred to the [facility]      following discharge, to follow up with Dr. Lars Mage.   1. Stage IV chronic kidney disease.  Renal indices were as mentioned      in #2 above.  This necessitated calling a nephrology consultation,      which was initially provided by Dr. Salem Senate.  He carried out      extensive testing including 24-hour urine for proteinuria, serum      protein electrophoresis, urine protein electrophoresis (reflex),      PTH and anti-GBM antibody.  Renal ultrasound scan was done, which      showed hyperdense kidneys and renal size which was stable to      increased.  Conclusion, was that the patient has stage IV chronic      kidney disease, likely on the basis of HIV nephropathy and may      subsequently require dialysis.  She was subsequently seen by Dr.      Corliss Parish who has arranged followup appointment on an      outpatient basis, on April 20, 2007.   1. Hypertension.  This was addressed with a combination of beta  blocker and calcium channel antagonist.   1. Congestive cardiomyopathy.  The patient underwent chest x-ray on      March 24, 2007.  For details of findings, refer to the procedure      list above.  This showed persistent bilateral pulmonary opacities      albeit improved,  raising the suspicion of possible congestive heart      failure.  The patient's BNP was somewhat elevated at 444, although      it is likely that renal dysfunction may have been contributory to      this.  She was given a single intravenous dose of Lasix and by      March 27, 2007, BNP had dropped to 248.  A 2-D echocardiogram done      on March 26, 2007 showed LV function of 45-50%, described a mild      decrease in LV function, LV size was upper limits of normal.  The      patient has been continued on beta blocker.  However, Lasix has      been added to her treatment.   1. Anemia.  This is likely anemia of chronic disease, on the basis of      HIV disease and chronic renal failure.  She has been commenced on      iron supplements and received Aranesp during the course of her      hospitalization.   DISPOSITION:  The patient was on March 27, 2007 considered sufficiently  clinically recovered and stable to be discharged.  She was therefore,  discharged accordingly.   DIET:  Heart healthy.   ACTIVITY:  As tolerated.   FOLLOW-UP INSTRUCTIONS:  1. The patient is to follow up with Dr. Corliss Parish, [facility] on April 10, 2007 at 4:15 p.m. Telephone number [phone].      She has been supplied appropriate information.  2. She is also to follow up with Dr. Lars Mage, infectious diseases      specialist at [facility], at the date to be determined.  The appropriate information      has also been supplied.  3. In addition, it is felt that the patient will benefit from having a      PMD for routine and preventative care.  She has been counseled      accordingly, and referred to [facility].  It is desirable that she be seen within 1-2 weeks of discharge.      Sherryl Manges, M.D.  Electronically Signed     CO/MEDQ  D:  03/27/2007  T:  03/27/2007  Job:  [id number]   cc:   Myriam Jacobson  Fax:  [phone]   Alison Murray, M.D.  Fax: [phone]   Louis Meckel, M.D.  Fax: [phone]

## 2010-05-21 NOTE — Discharge Summary (Signed)
[name], [name]                 ACCOUNT NO.:  [id number]   MEDICAL RECORD NO.:  [id number]          PATIENT TYPE:  INP   LOCATION:  6703                         FACILITY:  [facility]   PHYSICIAN:  Nelda Severe. Kellie Simmering, M.D.  DATE OF BIRTH:  [birth year]   DATE OF ADMISSION:  10/22/2007  DATE OF DISCHARGE:  10/26/2007                               DISCHARGE SUMMARY   DISCHARGE DIAGNOSES:  1. Severe edema in the left arm secondary to suspected venous      hypertension from AV graft placement 4 days prior.  2. Hypertension.  3. End-stage renal disease.  4. Anemia.  5. Human immunodeficiency virus positive.   PROCEDURES PERFORMED:  October 23, 2007:  1. Removal of arteriovenous Gore-Tex graft in the left upper extremity      with repair of left axillary artery with vein patch angioplasty.  2. Bilateral ultrasound localization with left internal jugular veins.  3. Insertion of Diatek catheter via right internal jugular vein by Dr.      Kellie Simmering.   COMPLICATIONS:  None.   CONDITION AT DISCHARGE:  Stable, improving.   DISCHARGE MEDICATIONS:  Percocet 5/325 one p.o. q.4 h p.r.n. pain,  Sustiva 600 mg p.o. nightly, Combivent inhaler p.r.n., Dialyvite one  p.o. daily.   DISPOSITION:  She is being discharged home in stable condition with her  wound healing well.  She is given careful instructions regarding her  activity and wounds.  She is to see Dr. Kellie Simmering in 2 weeks and the office  will phone with the visit.  She is to begin dialysis at 10:45 at [facility] dialysis center.   BRIEF IDENTIFYING STATEMENT:  For complete details, please refer to the  typed history and physical.  Briefly, this very pleasant 55 year old  woman underwent placement of a left upper arm AV graft 4 days prior to  this admission.  She developed massive edema of her left arm, which was  felt to be due to venous hypertension.  Dr. Kellie Simmering felt that she should  undergo removal of the graft.  She was informed of the  risks, and  benefits of the procedure and after careful consideration elected to  proceed with surgery.   HOSPITAL COURSE:  Preoperative workup completed.  She was brought in  through same-day surgery and underwent the aforementioned removal of  graft and placement of Diatek.  She tolerated the procedure was returned  to the post anesthesia care unit extubated.  Following stabilization,  she was transferred to a bed on a renal convalescent floor.  Dialysis  was initiated at this hospital stay.  Arrangements were made for  outpatient dialysis.  She is to be dialyzed at the [facility] as outlined  above.  She is to have home health change her dressing on her arm wound  on a daily basis.  They have to use Adaptic or nonadhering dressing.  She was felt stable and was discharged home on October 26, 2007.      Chad Cordial, PA      Nelda Severe Kellie Simmering, M.D.  Electronically Signed  KEL/MEDQ  D:  10/26/2007  T:  10/27/2007  Job:  [id number]

## 2010-05-21 NOTE — Op Note (Signed)
[name], [name]                 ACCOUNT NO.:  [id number]   MEDICAL RECORD NO.:  [id number]          PATIENT TYPE:  INP   LOCATION:  6703                         FACILITY:  [facility]   PHYSICIAN:  Nelda Severe. Kellie Simmering, M.D.  DATE OF BIRTH:  1955-08-10   DATE OF PROCEDURE:  10/23/2007  DATE OF DISCHARGE:                               OPERATIVE REPORT   PREOPERATIVE DIAGNOSIS:  Massive edema, left upper extremity secondary  to suspected venous hypertension from arteriovenous Gore-Tex graft, left  arm.   POSTOPERATIVE DIAGNOSIS:  Massive edema, left upper extremity secondary  to suspected venous hypertension from arteriovenous Gore-Tex graft, left  arm.   OPERATION:  1. Removal of arteriovenous Gore-Tex graft, left upper extremity.  2. Repair of left axillary artery with vein patch angioplasty.  3. Bilateral ultrasound localization, internal jugular veins.  4. Insertion of Diatek catheter via right internal jugular vein.   SURGEON:  Nelda Severe. Kellie Simmering, MD   FIRST ASSISTANT:  Nurse.   ANESTHESIA:  General endotracheal.   PROCEDURE:  The patient was taken to the operating room and placed in  the supine position at which time satisfactory general endotracheal  anesthesia was administered.  Upper chest and neck were exposed and both  internal jugular veins were imaged using B-mode ultrasound, both noted  to be widely patent, the right larger than the left.  After prepping and  draping in a routine sterile manner, right internal jugular vein was  entered using a supraclavicular approach.  Guidewire was passed into the  right atrium under fluoroscopic guidance.  After dilating the tract  appropriately, a 24-cm Diatek catheter was passed through a peel-away  sheath, positioned in the right atrium, tunneled peripherally, and  secured with nylon sutures.  The wound was closed with Vicryl in the  subcuticular fashion.  Sterile dressing was applied.  Attention was  turned to the left upper  extremity, which was prepped with Betadine  scrub and solution and draped in the routine sterile manner.  There was  diffuse edema 3+ from the axilla to the fingers with multiple blisters  in the distal upper arm and forearm, primarily centered around a  previous longitudinal antecubital wound and a forearm wound where a loop  graft had been inserted and then removed on October 18, 2007.  These  were all evacuated and clear fluid was noted and there was no evidence  of any purulence.  The wound near the axilla was opened and both limbs  of the loop of upper arm graft were exposed.  Axillary vein was ligated  proximally with a 2-0 silk tie and a piece of the vein preserved for a  vein patch for repair of the brachial artery.  There was excellent flow  through the graft.  Brachial/axillary artery was occluded proximally and  distally with clamps and the Gore-Tex graft was excised from the  axillary artery.  There was no thrombus in the vessel and there was  excellent inflow and backbleeding.  The vein patch was fashioned  appropriately and sewn into place with 6-0 Prolene repairing the  brachial  artery.  This was completed and there was an excellent pulse  and Doppler flow both in the axillary artery and the wrist.  Following  this, having disconnected the wound, I attempted to pull the graft out  from this wound, but it was too adherent.  This necessitated making 1  incision at about the 7 o'clock position and did not want to open the  antecubital wound which had a lot of blistering and a Dacron patch had  been used to repair the brachial artery in that area.  After making this  small incision, the graft was easily removed through the tunnel and was  sent for culture.  Jackson-Pratt drain was brought out through the  distal wound, left draining the venous half of the loop (medial) up to  the axillary wound.  This was secured with nylon suture.  After  hemostasis was achieved, the wounds  were closed in layers with Vicryl  and clips.  Sterile dressing was applied including Xeroform, 4 x 4s,  Kerlix, and the patient was taken to the recovery room in stable  condition.      Nelda Severe Kellie Simmering, M.D.  Electronically Signed     JDL/MEDQ  D:  10/23/2007  T:  10/23/2007  Job:  [id number]

## 2010-05-21 NOTE — Procedures (Signed)
CEPHALIC VEIN MAPPING   INDICATION:  Preop left upper extremity vein map.   HISTORY:  ESRD.   EXAM:   The right cephalic vein is not evaluated.   The left cephalic vein is compressible.   Diameter measurements range from 0.15 cm to 0.29 cm.   See attached worksheet for all measurements.   IMPRESSION:  Patent left cephalic vein which is not of acceptable  diameter for use as a dialysis access site.   ___________________________________________  Jessy Oto. Fields, MD   AS/MEDQ  D:  10/06/2007  T:  10/06/2007  Job:  [id number]

## 2010-05-21 NOTE — Consult Note (Signed)
NAMETAQUILLA, ECHEVERRY                 ACCOUNT NO.:  [id number]   MEDICAL RECORD NO.:  [id number]          PATIENT TYPE:  INP   LOCATION:  37                         FACILITY:  [facility]   PHYSICIAN:  Maudie Flakes. Hassell Done, M.D.   DATE OF BIRTH:  07/25/1955   DATE OF CONSULTATION:  03/22/2007  DATE OF DISCHARGE:                                 CONSULTATION   [name] is a 55 year old black woman admitted on March 20, 2007, with  cough, infiltrate on chest x-ray, and elevated serum creatinine.  She  has no known history of renal disease that she knows of.  HIV has been  drawn and is positive (Western blot pending).  Renal consult was  requested by Dr. Algis Liming   PAST MEDICAL HISTORY:  None, according to her.  The only time she has  been in the hospital that she remembers is for the birth of her  children.  She was admitted to [facility] in March of 2004 with  volume depletion and diarrhea.  Review of echart shows results of renal  function as follows:  DATE              BUN/CR  June 2002               11/1.3  March 23, 2002          46/3.4 (volume depletion and diarrhea).  March 28, 2002          19/1.15 June 2002               8/0.15 March 2003        10/1.0  March 20, 2007          33/3.66  March 22, 2007          28/3.25   MEDICATIONS:  Prior to admission; none.   CURRENT MEDICATIONS:  1. Rocephin 1 gram.  2. Zithromax 0.5 gram.  3. Albuterol.  4. Protonix.  5. P.r.n.   She had been on IV fluids until earlier today.   ALLERGIES:  No known drug allergies.   REVIEW OF SYSTEMS:  No angina, no claudication.  No melena or  hematochezia.  No fever, no weight loss, no oral ulcers, no rash, no  gross hematuria, and no renal colic.   FAMILY HISTORY:  Her mother [name]) and her uncle [name]) were on dialysis prior to their death.  Mr. [name] died just last  week (end-stage renal disease was secondary to diabetes mellitus).  Her  father died of drinking.  She  has two daughters who are healthy.   SOCIAL HISTORY:  She lives with her two daughters.  She has never been  married.  She grew up in [place].  She finished the ninth grade at  [employer].  She does not work.  She gets disability ([employer])  because I am slow.  No cigarettes, no alcohol, and no illegal drugs.   PHYSICAL EXAMINATION:  GENERAL:  She is awake, alert, friendly black  woman looking older than her stated age  of 51.  VITAL SIGNS:  Temperature 97.5, pulse 80, respirations 20, blood  pressure 120/70.  GENERAL:  No consolidation, no rales.  HEART:  2/6 systolic murmur at the upper left sternal border.  ABDOMEN:  Nontender.  No organs or masses were felt.  Bowel sounds were  present.  EXTREMITIES:  Lymphedema left greater than right.  NEUROLOGY:  Right-handed strength equal, sensation intact.   LABORATORY DATA:  Hemoglobin 9.5, WBC 3400, platelets 266,000  (transfused earlier during this hospitalization). Sodium 136, potassium  3.7, chloride 109, CO2 21, BUN 28, creatinine 3.25, calcium 8.2.  HIV  positive.  ESR 100, ANA negative.  FE/TIBC 7%, ferritin 106.  Urinalysis; specific gravity 1.014, pH 6, large blood, 4+ protein, 3-6  WBC, 11-20 RBC, no casts (catheterized specimen).   Renal ultrasound shows marked increased echogenicity of kidneys, renal  size right 9.7 cm, left 10.8 cm.  No hydronephrosis noted.   IMPRESSION:  Abnormal renal function (last known creatinine was 1.0 in  March of 2005), question if this is old or new.  X-ray says renal size  unchanged from 2004;  positive HIV needs to be confirmed (usually see proteinuria with HIV  nephropathy); very echodense kidneys, proteinuria, and elevated  creatinine consistent with HIV nephropathy.   PLAN:  24-hour urine for protein, creatinine, SPEP, UPEP (reflex).  Check PTH, ANCA, anti-GBM antibody, save left arm for vascular access.  Avoid nephrotoxins and hypotension.  We will follow.  She may need EPO  if  hemoglobin continues to decline, I will give IV iron.      Richard F. Hassell Done, M.D.  Electronically Signed     RFF/MEDQ  D:  03/22/2007  T:  03/22/2007  Job:  [id number]

## 2010-05-21 NOTE — Consult Note (Signed)
[name], [name]                 ACCOUNT NO.:  [id number]   MEDICAL RECORD NO.:  [id number]          PATIENT TYPE:  INP   LOCATION:  2303                         FACILITY:  [facility]   PHYSICIAN:  Maudie Flakes. Hassell Done, M.D.   DATE OF BIRTH:  13-Apr-1955   DATE OF CONSULTATION:  11/29/2007  DATE OF DISCHARGE:                                 CONSULTATION   REFERRING PHYSICIAN:  1. 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  She has had several problems with left upper arm  AVG.  Today patch angioplasty at left axilla blew out.  She was on the  toilet when this happened and she called out to her daughter Danae Chen, who  found her sitting on the toilet with blood all over the floor.  EMS  was called.  She was intubated and brought to [facility] ER.  She was  taken to the OR, received PRBC and had left axillary radial bypass as  well as right LE GSV.  Blood pressure was 60/40 when EMS arrived at her  house.  She has received 7 U PRBC.  She usually has dialysis Tuesday,  Thursday, Saturday (4 hours, EDW 81.5 Kilos, right IJ PermCath, EPO  16,000, Hectorol 2 mcg, PTH 314 - 26 November 2007, Fe/TIBC 20%).  Renal  consult was requested by Dr. Trula Slade.  Currently, she is on Levophed 22  mcg, vasopressin 0.3 units per minute.   PAST MEDICAL HISTORY:  Positive for HIV, lymphedema, ESRD, and secondary  hyperparathyroidism.   MEDICATIONS PRIOR TO ADMISSION:  Daughter says, She does not take any  medicine.  Med list at dialysis  notes her meds as Combivir  (lamivudine/Zidovudine) 1 a day, Sustiva 600/D, Dialyvite 1 a day, and  Percocet p.r.n.   REVIEW OF SYSTEMS:  Unable to obtain.   FAMILY HISTORY:  She has 2 daughters Danae Chen and [place]) who are in  good health.  Her mother [name]) an aunt Kelli Churn), were both  on dialysis (deceased).   SOCIAL  HISTORY:  She lives with her daughter Danae Chen.  She grew up in  [place].  She attended [employer] and finished 9th grade.  She receives SSI (does not work).  No cigarettes.  No alcohol.  No  illegal drugs.   PHYSICAL EXAMINATION:  GENERAL:  She is intubated, sedated (last  fentanyl [3mg ] was given at 10:30 a.m. today).  HEAD:  No trauma.  NECK:  Right IJ PermCath, 2 lumen left EJ.  CHEST:  Rhonchi.  HEART:  No rub.  ABDOMEN:  Nontender.  EXTREMITIES:  Postoperative LUE,  trace pretibial edema.   O2 saturation 100% on 50% FIO2, pH 7.34, pO2 543, pCO2 34 ( 3 p.m.)   LABORATORY DATA:  Sodium 138, potassium 4.1, chloride 115, CO2 18, BUN  25, CR followup 4.37, calcium 6.2, phosphorous 6.0, albumin 1.1 (2:21  p.m.).  Chest x-ray shows no CHF.   IMPRESSION:  1. Shock secondary  to massive blood loss, ? MI.  2. End-stage renal disease.  Dialysis Tuesday, Thursday, and Saturday.  3. Positive human immunodeficiency virus.   PLAN:  1. Replace blood, check EKG, cardiac enzymes, blood cultures.  2. Dialysis p.r.n.  Decrease phosphorous with binders once taking p.o.      Continue EPO and Hectorol.  3. Check with drug storeto  see whether she fills any of her      medications ([place]).  4. Check with ID to see if she should be on meds for human      immunodeficiency virus.      Richard F. Hassell Done, M.D.  Electronically Signed     RFF/MEDQ  D:  11/29/2007  T:  11/30/2007  Job:  [id number]

## 2010-05-21 NOTE — Discharge Summary (Signed)
[name], [name]                 ACCOUNT NO.:  [id number]   MEDICAL RECORD NO.:  [id number]          PATIENT TYPE:  INP   LOCATION:  6706                         FACILITY:  [facility]   PHYSICIAN:  Theotis Burrow IV, MDDATE OF BIRTH:  [birth year]   DATE OF ADMISSION:  11/29/2007  DATE OF DISCHARGE:  12/05/2007                               DISCHARGE SUMMARY   FINAL DISCHARGE DIAGNOSES:  1. Ruptured axillary artery, dehiscence of left brachial artery patch      angioplasty.  2. Shock secondary to massive hemorrhage.  3. End-stage renal disease, dialysis on Tuesdays, Thursdays, and      Saturdays.  4. Human immunodeficiency virus positive.   PROCEDURES PERFORMED:  Left axillary artery to radial artery bypass  grafting using a non-reversed greater saphenous vein from the right leg.   COMPLICATIONS:  None.   DISCHARGE MEDICATIONS:  1. Dialyvite 1 mg tablet p.o. daily.  2. Sustiva 600 mg tablets p.o. t.i.d. on hemodialysis days.  3. __________ 20 mg capsule.  4. Epivir 10 mg/mL solution.  5. Metoprolol 50 mg p.o. daily.  6. Combivir 100/300 mg tablets.  7. She was given a prescription for Percocet 5/325 one p.o. q.4 h.      p.r.n. pain, total #20 were given.   CONDITION AT DISCHARGE:  Stable improving.   DISPOSITION:  She is being discharged home in stable condition.  She is  given careful instructions regarding the care of her wounds.  She is to  see Dr. Trula Slade in 2 weeks for followup.  She is to continue her  dialysis on Tuesdays, Thursdays, and Saturdays.   BRIEF IDENTIFYING STATEMENT:  For complete details, please refer the  typed history and physical.  Briefly, this is a very pleasant 55-year-  old immunosuppressed woman with end-stage renal disease who developed a  dehiscence of her patch repair of her left axillary artery.  She was  brought to the emergency room and was evaluated by Dr. Trula Slade who  recommended emergency repair.   HOSPITAL COURSE:  Preoperative workup  was completed on an emergency  basis.  She was taken to the operating room and underwent the  aforementioned revascularization and repair of her axillary artery.  For  complete details, please refer to the typed operative report.  The  procedure was without complications.  She was returned to her bed in  intensive care unit in critical but hemodynamically stable condition.  We do appreciate the Renal Services assistance for medical and  hemodialysis purposes.  We were eventually able to extubate her and  transfer her to a bed on a renal  floor.  Her arm and leg were carefully followed.  Her drains were  removed at appropriate times.  Her activities and diet were advanced as  tolerated.  On December 05, 2007, she was felt stable and was discharged  home in stable condition.      Chad Cordial, PA      V. Leia Alf, MD  Electronically Signed    KEL/MEDQ  D:  12/05/2007  T:  12/05/2007  Job:  [id number]   cc:   _________

## 2010-05-21 NOTE — Op Note (Signed)
[name], [name]                 ACCOUNT NO.:  [id number]   MEDICAL RECORD NO.:  [id number]          PATIENT TYPE:  AMB   LOCATION:  SDS                          FACILITY:  [facility]   PHYSICIAN:  VAnnamarie Major IV, MDDATE OF BIRTH:  June 30, 1955   DATE OF PROCEDURE:  06/14/2008  DATE OF DISCHARGE:                               OPERATIVE REPORT   PREOPERATIVE DIAGNOSIS:  End-stage renal disease.   POSTOPERATIVE DIAGNOSIS:  End-stage renal disease.   PROCEDURE PERFORMED:  Right upper arm arteriovenous Gore-Tex graft.   TYPE OF ANESTHESIA:  General.   BLOOD LOSS:  Minimal.   COMPLICATIONS:  None.   INDICATIONS:  [name] is a 55 year old female with end-stage renal  disease who previously had a left upper arm infected graft which blew  out on multiple times and ultimately required axillary to radial bypass  graft.  She recently underwent a fistula which has not matured.  She  comes in today for upper arm graft.  She is HIV positive.   SURGEON:  1. [name] Alf, MD   ASSISTANT:  Jacinta Shoe, P.A.   PROCEDURE:  The patient was identified in the holding area and taken to  room 6.  She was placed supine on the table.  General anesthesia was  administered.  The patient was prepped and draped in standard sterile  fashion.  Time-out was called.  Antibiotics were given.  A longitudinal  incision was made just proximal to the antecubital crease on the medial  side.  Cautery was used to divide the subcutaneous tissue.  Crural  fascia was divided with cautery.  Brachial artery was identified,  mobilized proximally and distally, encircled vessel loop.  Brachial  artery is approximately 3 mm.  Next a longitudinal incision was made in  the axillary up by the hairline of the axilla.  Tissue was divided with  cautery.  Fascia was opened sharply.  A large brachial vein  approximately 4 mm was identified.  It was mobilized and encircled with  vessel loops.  Next, subcutaneous  tunnel was created with the horseshoe  tunneler.  Once this was complete, the patient was given systemic  heparinization.  The arterial anastomosis was performed first.  After  heparin circulated, the artery was occluded with serrefine vascular  clamps.  A #11 blade was used make an arteriotomy which was extended  with Potts scissors.  An end-to-side anastomosis was then created  between the graft and the artery using a running 6-0 Prolene.  Prior to  completion, the artery was flushed antegrade and retrograde fashion.  I  was not very pleased with flow through the graft.  However, Doppler  signal proximal and distal to the anastomosis were adequate.  As the  patient's blood pressure improved, flow through the graft also improved.  I did not think there was a technical stenosis with the arterial  anastomosis but rather this was a hemodynamic phenomenon.   The attention was then turned towards the vein.  A 2-0 silk was used to  ligate the vein distally.  I performed an end-to-end  anastomosis.  The  graft was beveled and the vein was spatulated and end-to-side  anastomosis was created with running 6-0 Prolene.  Prior to completion  the graft was appropriately flushed.  The anastomosis was secured.  There was a thrill by Doppler in the vein which dissipated with the  graft occlusion.  There was a faint pulse within the graft which  augmented as the blood pressure improved.  I did not see anything  further to do and therefore, the wounds were irrigated and the tissue  was reapproximated with 3-0 Vicryl.  Skin was closed 4-0 Vicryl.  Dermabond was then placed.      Eldridge Abrahams, MD  Electronically Signed     VWB/MEDQ  D:  06/14/2008  T:  06/14/2008  Job:  [id number]

## 2010-05-21 NOTE — Assessment & Plan Note (Signed)
OFFICE VISIT   Mabee, Quantina A  DOB:  04-29-1955                                       01/10/2008  [id number]   REASON FOR VISIT:  Follow-up.   HISTORY:  This is a 55 year old female who presented in extremis from  bleeding from upper arm dialysis access sites, on November 23.  She  underwent left axillary artery to radial artery bypass graft with non  reversed greater saphenous vein from the right leg.  She developed a  wound from where her artery and blown out.  When I last saw her, I  opened it up and the bypass graft was not visible, and we started wet to  dry dressing changes.  She comes in today, the wound is nearly healed.  She has a palpable brachial pulse, very excited that this is going to do  well for her.  She will need be placed on our bypass graft surveillance  protocol.  I will see her back in 6 weeks.   Eldridge Abrahams, MD  Electronically Signed   VWB/MEDQ  D:  01/10/2008  T:  01/11/2008  Job:  [id number]   cc:   Louis Meckel, M.D.

## 2010-05-21 NOTE — H&P (Signed)
[name], [name]                 ACCOUNT NO.:  [id number]   MEDICAL RECORD NO.:  [id number]          PATIENT TYPE:  INP   LOCATION:                               FACILITY:  [facility]   PHYSICIAN:  Nelda Severe. Kellie Simmering, M.D.  DATE OF BIRTH:  30-Sep-1955   DATE OF ADMISSION:  10/23/2007  DATE OF DISCHARGE:                              HISTORY & PHYSICAL   CHIEF COMPLAINT:  Massive edema, left upper extremity secondary to AV  graft placed 3 days ago.   HISTORY OF PRESENT ILLNESS:  This 55 year old female patient who is HIV  positive, end-stage renal disease has not started hemodialysis.  She had  an AV graft surgery in the left arm by Dr. Ruta Hinds on October 18, 2007, and immediately noted swelling in the left arm.  She reported to  emergency room tonight with severe blistering of her left upper  extremity and edema from the axilla to the fingers.  She does not  complain of pain in her hand or fingers or numbness in her hand or  fingers, but severe pain related to the edema in the arm.  She was felt  to have severe venous hypertension and scheduled to be taken to the  operating room for removal of the graft.   PAST MEDICAL HISTORY:  1. Hypertension.  2. HIV positive.  3. End-stage renal disease.  4. Anemia.  5. Negative for coronary artery disease, diabetes, or stroke.   ALLERGIES:  None known.   MEDICATIONS:  Please see health history form.   FAMILY HISTORY:  Positive end-stage renal disease.  Mother and uncle  were both on dialysis prior to death.   SOCIAL HISTORY:  She is unmarried, lives with sister.  She has ninth  grade education.   REVIEW OF SYSTEMS:  Primarily positive because of swelling in the left  upper extremity with pain and blistering, but denies any shortness of  breath, chest pain, etc.   PHYSICAL EXAMINATION:  VITAL SIGNS:  Blood pressure 140/60, heart rate  is 80, temperature is 98.  GENERAL:  She is alert and oriented x3.  NECK:  Supple, 3+ carotid  pulses palpable.  CHEST:  Clear to auscultation.  CARDIOVASCULAR:  Regular rhythm.  No murmurs.  ABDOMEN:  Soft, nontender with no masses.  EXTREMITIES:  Left arm to have 3+ edema from the axilla to the fingers.  There were severe blistering in the forearm distally adjacent to  incision for insertion of a loop graft, which was aborted to place an  upper arm graft.  There was also blistering in the antecubital wound as  well as some near the axillary wound.  There was no purulent drainage.  The arm was diffusely inflamed.  Right upper extremities unremarkable.   IMPRESSION:  Severe edema, left arm secondary to suspected venous  hypertension from AV graft placed 4 days ago.   PLAN:  Take the patient to the operating room to remove the graft,  repair of the brachial artery, and for local wound care.  We will start  her on IV antibiotics and obtain  a renal consult.  We will also place  dialysis catheter as per Dr. Abel Presto instructions for hemodialysis at  this time.      Nelda Severe Kellie Simmering, M.D.  Electronically Signed     JDL/MEDQ  D:  10/24/2007  T:  10/24/2007  Job:  [id number]   cc:   Elzie Rings. Lorrene Reid, M.D.

## 2010-05-21 NOTE — Op Note (Signed)
[name], [name]                 ACCOUNT NO.:  [id number]   MEDICAL RECORD NO.:  [id number]          PATIENT TYPE:  AMB   LOCATION:  SDS                          FACILITY:  [facility]   PHYSICIAN:  Charles E. Fields, MD  DATE OF BIRTH:  [birth year]   DATE OF PROCEDURE:  10/18/2007  DATE OF DISCHARGE:                               OPERATIVE REPORT   PROCEDURES:  1. Attempted left forearm arteriovenous graft.  2. Placement of left upper arm arteriovenous graft.   PREOPERATIVE DIAGNOSIS:  End-stage renal disease.   POSTOPERATIVE DIAGNOSIS:  End-stage renal disease.   ANESTHESIA:  General.   ASSISTANT:  Jacinta Shoe, PA-C   OPERATIVE FINDINGS:  1. High brachial artery bifurcation with small brachial branch at the      antecubital region.  2. A 4-7 mm tapered PTFE graft, left upper arm loop with arterial limb      on the radial aspect of the arm.   OPERATIVE DETAILS:  After obtaining informed consent, the patient was  taken to the operating room.  The patient was placed in the supine  position on the operating table.  After induction of general anesthesia,  the patient's entire left upper extremity was prepped and draped in the  usual sterile fashion.  A longitudinal incision was made near the  antecubital crease in the left arm.  A transverse incision was made in  this location and carried down through the subcutaneous tissues down to  level of the brachial artery.  The artery was fairly small caliber and  probably a high brachial bifurcation.  This was dissected free  circumferentially and vessel loops were placed proximal and distal to  the planned site of arteriotomy.  Vessel was approximately 2 mm in  diameter.  Next, the adjacent deep brachial vein was dissected free  circumferentially.  This was approximately 2.5 mm in diameter.  A  subcutaneous tunnel was then created in loop configuration down the  forearm with a transverse incision in the distal forearm for  assistance  in tunneling.  A 4-7 mm tapered PTFE graft was brought through the  subcutaneous tunnel.  The patient was given 5000 units of intravenous  heparin.  Vessel loops were used to control the artery proximally and  distally.  A longitudinal opening was made in the artery.  The graft was  beveled and sewn end of graft to side of artery using a running 6-0  Prolene suture.  Just prior to completion of anastomosis, this was  forebled, backbled, and thoroughly flushed.  Anastomosis was secured.  Vessel loops were released.  There was marginal flow through the graft  at this time.  It was decided that the arterial flow was not going to be  sufficient to support graft in the forearm.  At this point, the graft in  the forearm was aborted.  The arterial anastomosis was oversewn leaving  a small cuff of PTFE material on the artery.  The remainder of the graft  was then removed.  Distal forearm incision was closed with a running 3-0  Vicryl suture in  the subcutaneous tissues and then run back through a  subcuticular stitch in the skin.  Next, attention was turned to the  axillary region.  A longitudinal incision was made in the bicipital  groove in the left upper arm.  Incision was carried down through the  subcutaneous tissues down to the level of the axillary vein.  This was  dissected free circumferentially.  It was of good quality approximately  6-7 mm in diameter.  Several large side branches were ligated and  divided between silk ties.  Next, the brachial artery was dissected free  circumferentially.  There was one large posterior branch and a vessel  loop was placed around this.  Several small side branches were ligated  and divided between silk ties.  The artery was approximately 4-5 mm in  diameter.  Vessel loops were placed around this.  There was one nerve,  thought to be sensory in nature which was lying between the artery and  the vein.  This was adjacent to the arterial  anastomosis and then the  construction.  The patient was given an additional 5000 units of  intravenous heparin.  Vessel loops were used to control brachial artery  proximally and distally.  A longitudinal opening was made in the  brachial artery and the graft was sewn end of graft to side of artery  using a running 6-0 Prolene suture.  Just prior to completion of  anastomosis, this forebled, backbled, and thoroughly flushed.  Anastomosis was secured.  Graft was clamped on the venous 7-mm end of  the graft.  The distal end of the axillary vein was then ligated with 2-  0 silk tie and the vein was transected.  The vein was then controlled  proximally with a fine bulldog clamp.  The vein was gently flushed with  heparinized saline and distended.  The vein was opened longitudinally.  The graft was beveled and sewn end of graft and end of vein using a  running 6-0 Prolene suture.  Just prior to completion of anastomosis,  this forebled, backbled, and thoroughly flushed.  Anastomosis was  secured.  Clamps were released.  There was palpable thrill above the  graft immediately.  Next, hemostasis was obtained.  Subcutaneous tissues  of both the antecubital incision and the upper arm incision were closed  with running 3-0 Vicryl suture.  Skin of both incisions was closed with  4-0 Vicryl subcuticular stitch.  The patient tolerated the procedure  well and there were no complications.  Instrument, sponge, and needle  counts were correct at the end of the case.  The patient was taken to  recovery room in stable condition.  The patient had audible radial  Doppler signal, which augmented approximately 50-75% with compression of  the graft at the end of the case.      [name]. Fields, MD  Electronically Signed     CEF/MEDQ  D:  10/18/2007  T:  10/19/2007  Job:  [id number]

## 2010-05-21 NOTE — Assessment & Plan Note (Signed)
OFFICE VISIT   [name], [name]  DOB:  1955/02/03                                       05/05/2008  [id number]   The patient presents today for evaluation for AV access.  She is [name] very  pleasant 55 year old female with [name] very difficult prior history of  access.  She had an infected left upper arm AV graft requiring multiple  surgeries including eventual axillary artery to radial artery bypass  with nonreversed saphenous vein from her left leg, this was on  11/29/2007.  She has currently healed all of the incisions on the left  arm and has [name] well perfused left hand.  She is now seen for further  discussion for additional access options.  She does have [name] right IJ  Diatek catheter which has been working without difficulty.   PHYSICAL EXAMINATION:  Her right arm does have [name] moderate sized cephalic  vein on physical exam.  She does have [name] 2+ radial pulse.  Her vein map  on the right shows good caliber from the wrist to her shoulder with some  slight decrease in size at the antecubital space.   I discussed the options with the patient, I have recommended that we  proceed with AV fistula creation.  She understands the procedure under  local anesthesia.  She also understands the possibility of non-  maturation.  I have recommended that we proceed with this before she has  more significant issues with the catheter.  She is somewhat hesitant due  to the degree of difficulty she had in the left arm, but has agreed to  proceed on May 15, 2008, as an outpatient, for AV access.   Rosetta Posner, M.D.  Electronically Signed   TFE/MEDQ  D:  05/05/2008  T:  05/08/2008  Job:  [id number]   cc:   Louis Meckel, M.D.

## 2010-05-21 NOTE — Procedures (Signed)
CEPHALIC VEIN MAPPING   INDICATION:  Preoperative right upper extremity vein mapping.   HISTORY:  History of left upper extremity AV fistula among other arterial  surgeries.   EXAM:  The right cephalic vein is compressible.   Diameter measurements range from 0.26 cm to 0.46 cm.   The left cephalic vein was not evaluated.   IMPRESSION:  Patent right cephalic vein with diameter measurements as  described above and on the attached worksheet.   ___________________________________________  Rosetta Posner, M.D.   CH/MEDQ  D:  05/05/2008  T:  05/05/2008  Job:  [id number]

## 2010-05-21 NOTE — Op Note (Signed)
[name], [name]                 ACCOUNT NO.:  [id number]   MEDICAL RECORD NO.:  [id number]          PATIENT TYPE:  INP   LOCATION:  [id number]                         FACILITY:  [facility]   PHYSICIAN:  Judeth Cornfield. Scot Dock, M.D.DATE OF BIRTH:  February 03, 1955   DATE OF PROCEDURE:  01/19/2008  DATE OF DISCHARGE:  01/25/2008                               OPERATIVE REPORT   PREOPERATIVE DIAGNOSIS:  Infected lymphocele of the right thigh.   POSTOPERATIVE DIAGNOSIS:  Infected lymphocele of the right thigh.   PROCEDURE:  Incision and drainage of 2 large lymphoceles of the right  thigh and placement of VAC.   HISTORY:  This is a 55 year old woman who had had multiple bleeding  episodes from an upper arm graft and had undergone axillary artery to  radial artery bypass with a vein graft harvested from the right thigh.  She had later presented with fevers and swelling of the thigh and on my  exam, I felt she had infected lymphoceles in the thigh and she was  brought to the operating room for incision and drainage.   TECHNIQUE:  The patient was taken to the operating room and received a  general anesthetic.  The right lower extremity was prepped and draped in  the usual sterile fashion.  A longitudinal incision was made over the  more proximal lymphocele and a large amount of purulent material was  drained that appeared to be infected lymph fluid.  Intraoperative  cultures were sent.  Likewise, a second incision was made over the  adjacent lymphocele and a large amount of purulent material evacuated.  These were opened and thoroughly debrided and then irrigated with  copious amounts of saline and then hemostasis obtained using  electrocautery.  A VAC sponge system was then placed encompassing both  wounds and the patient tolerated the procedure well and was transferred  to the recovery room in satisfactory condition.  All needle and sponge  counts were correct.      Judeth Cornfield. Scot Dock, M.D.  Electronically Signed     CSD/MEDQ  D:  03/09/2008  T:  03/10/2008  Job:  [id number]

## 2010-05-21 NOTE — Assessment & Plan Note (Signed)
OFFICE VISIT   [name], [name] A  DOB:  May 24, 1955                                       02/07/2008  [id number]   HISTORY:  This is a 55 year old female who presented in extremis for  bleeding from upper arm dialysis access site on November 23.  She  ultimately underwent left axillary artery to radial artery bypass graft  with nonreverse greater saphenous vein from her right leg.  She  developed a wound from where her artery had blown out in her left upper  arm.  This has been debrided and packed.  It is now closed.  She did  develop seromas and lymphoceles in her right groin and was admitted by  Dr. Scot Dock.  These were operated on and a wound VAC was placed.  She  comes in today for evaluation.  She has two remaining areas that are  quite deep.  However, the tissue looks healthy.  There is no evidence of  infection.   I think she should continue with her wound VAC.  Hopefully this will  heal in the near future.  I am going to see her back in a month.   Eldridge Abrahams, MD  Electronically Signed   VWB/MEDQ  D:  02/07/2008  T:  02/09/2008  Job:  [id number]

## 2010-05-21 NOTE — Op Note (Signed)
[name], [name]                 ACCOUNT NO.:  [id number]   MEDICAL RECORD NO.:  [id number]          PATIENT TYPE:  AMB   LOCATION:  SDS                          FACILITY:  [facility]   PHYSICIAN:  Rosetta Posner, M.D.    DATE OF BIRTH:  03-26-1955   DATE OF PROCEDURE:  05/15/2008  DATE OF DISCHARGE:  05/15/2008                               OPERATIVE REPORT   PREOPERATIVE DIAGNOSIS:  End-stage renal disease.   POSTOPERATIVE DIAGNOSIS:  End-stage renal disease.   PROCEDURE:  Right upper arm atrioventricular fistula creation with  exploration of right wrist cephalic vein.   SURGEON:  Rosetta Posner, MD   ASSISTANT:  Nurse.   ANESTHESIA:  MAC.   COMPLICATIONS:  None.   DISPOSITION:  To recovery, stable.   PROCEDURE IN DETAIL:  The patient was taken to the operating room,  placed in supine position, where the area of the right arm was prepped  and draped in the usual sterile fashion.  The patient has had a preop  vein map showing adequate cephalic vein.  The incision was made using  local anesthesia with a cephalic vein at the wrist.  The vein was  extremely small and unusable.  Intraoperative ultrasound revealed small  vein throughout the forearm.  There was a patent cephalic vein at the  antecubital space extending into the upper arm cephalic vein.  For this  reason, a separate incision was made using local anesthesia at the  antecubital space, carried down to the left side in the antecubital  vein.  Branches of these were ligated and divided.  This was divided  distally and gently dilated, 2 dilator passed through the vein.  The  vein was small caliber.  The vein was brought into approximation of the  brachial artery.  The patient had a relatively high bifurcation of  brachial artery just above the elbow and the artery was of large caliber  above this.  Artery was occluded proximally and distally, was opened  with 11 blade just above the bifurcation.  The vein was spatulated  and  sewn end-to-side to the artery with a running 6-0 Prolene suture.  Flow  was noted in the fistula.  The wounds were irrigated with saline.  Hemostasis with electrocautery.  Wounds were closed with 3-0 Vicryl in  the subcutaneous and subcuticular tissues.  Benzoin and Steri-Strips  were applied.      Rosetta Posner, M.D.  Electronically Signed     TFE/MEDQ  D:  05/15/2008  T:  05/16/2008  Job:  [id number]

## 2010-05-21 NOTE — Consult Note (Signed)
[name], [name]                 ACCOUNT NO.:  [id number]   MEDICAL RECORD NO.:  [id number]          PATIENT TYPE:  INP   LOCATION:  [id number]                         FACILITY:  [facility]   PHYSICIAN:  Judeth Cornfield. Scot Dock, M.D.DATE OF BIRTH:  12-26-1955   DATE OF CONSULTATION:  10/31/2007  DATE OF DISCHARGE:                                 CONSULTATION   REASON FOR CONSULTATION:  Oozing from left axillary incision, consult  was from the emergency department.   HISTORY:  This is a 54 year old woman who had massive edema from the  left upper extremity secondary to venous hypertension from the left arm  AV Gore-Tex graft.  The graft had been placed by Dr. Oneida Alar on October 18, 2007.  She developed significant swelling in the left upper  extremity, and Dr. Kellie Simmering removed the graft on October 23, 2007, and  repair the axillary artery with vein patch angioplasty.  She had Diatek  catheter placed.  She presented to the emergency department and she did  have oozing from the incision for the last several days.  She has had no  fever that she was aware of.  She tells me she was receiving IV  antibiotics at the time of dialysis.   PAST MEDICAL HISTORY:  Significant for:  1. Hypertension.  2. End-stage renal disease.  3. Anemia.  4. History of HIV positive.   REVIEW OF SYSTEMS:  She has had no fever or chills.  She has had no  chest pain, chest pressure, or significant shortness of breath.   PHYSICAL EXAMINATION:  VITAL SIGNS:  Temperature is 97.3, heart rate is  80, and blood pressure 121/63.  LUNGS:  Clear bilaterally to auscultation.  CARDIAC:  She has a regular rate and rhythm.  EXTREMITIES:  She has moderate swelling in her left upper extremity and  a hematoma on her left axilla, I removed.  There is no significant  erythema.  The hand was warm and well perfused.   IMPRESSION:  The patient has old blood in the left axilla, and I removed  2 staples in the emergency department and  packed the wound and evacuated  some old hematoma.  I will arrange for her to come to the office  tomorrow to have the dressing changed, and arrangements can then be made  for home health to do the dressing changes once daily.  Given her  prescription for Tylox for pain.  She will then need close followup in  the office by Dr. Kellie Simmering to continue to follow this wound.       Judeth Cornfield. Scot Dock, M.D.  Electronically Signed    CSD/MEDQ  D:  10/31/2007  T:  11/01/2007  Job:  [id number]

## 2010-05-21 NOTE — Op Note (Signed)
[name], [name]                 ACCOUNT NO.:  [id number]   MEDICAL RECORD NO.:  [id number]          PATIENT TYPE:  INP   LOCATION:  2303                         FACILITY:  [place]   PHYSICIAN:  Theotis Burrow IV, MDDATE OF BIRTH:  [birth year]   DATE OF PROCEDURE:  11/29/2007  DATE OF DISCHARGE:                               OPERATIVE REPORT   PREOPERATIVE DIAGNOSIS:  Ruptured axillary artery.   POSTOPERATIVE DIAGNOSIS:  Dehiscence of left brachial artery patch  angioplasty.   PROCEDURE PERFORMED:  Left axillary artery to radial artery bypass graft  with a nonreversed greater saphenous vein from the right leg.   SURGEON:  1. Annamarie Major IV, MD   ASSISTANT:  1. Rosetta Posner, MD  2. Jacinta Shoe, PA   ANESTHESIA:  General.   BLOOD LOSS:  1.5 L.   FINDINGS:  The previous patch repair had blown out.  Accept, this was  the second blowout of the patch.  I tried primary repair, but this was  suboptimal and therefore the artery was ligated and bypass was  performed.  The patient's previous cultures grew out Pseudomonas from  the wound.   DRAINS:  A 15 Blake was placed in the right leg of the vein harvest site  and a 15-Blake drain was placed in the upper incision in the axillary  artery.   INDICATIONS:  This is a 55 year old female who underwent a left upper  arm loopgraft.  This became infected.  It was removed and a vein patch  was placed.  The vein patch blew out and the patient was taken for  emergent repair secondary to bleeding.  At that time, a bovine  pericardial patch was placed.  This was approximately 2 weeks ago.  The  patient presented back to the emergency department with significant  bleeding from the left arm.  She was taken urgently to the operating  room.  I spoke with the family.  We discussed the potential risks, which  included significant bleeding, possibility of death, and possibility of  moving her arm.  She was taken emergently to the  operating room.   PROCEDURE:  The patient identified in the emergency department and taken  to room 4.  She was placed supine on the table.  General endotracheal  anesthesia was administered.  The patient was then prepped and draped in  standard sterile fashion.  A time-out was called.  Antibiotics were  given.  The patient's previous axillary incision was extended cephalad.  Due to the girth of the arm as well as the high location, the previous  patch repair had significant difficulty obtaining proximal control.  This led to significant blood loss trying to obtain control of the  artery.  After significant blood loss, I was able to get proximal  control of the artery.  Once this was done, a vascular clamp was placed.  This enabled me to further dissect out the artery, which was embedded  and chronic thrombus as well as scar tissue.  Once the artery had been  exposed distally, I again placed a  vascular clamp distally.  It was  unable to examine previous patch repair.  This is not completely blown  out.  At this point, since the patient had previously blown out 2 other  patch repairs secondary to infection, I do not feel like proceeding with  patch repair was advisable and therefore attempted primary repair.  Her  artery was found to be extremely small at this level approximately 2 mm.  Primary repair was attempted using a running 6-0 Prolene.  After primary  repair was completed, a Doppler was used to evaluate signal distal to  the repair and there was no signal.  I presume that the artery was  occluded and therefore I felt the best chance at limb salvage would be  to perform a bypass graft with vein tunneling outside the infected area.  I then made an incision in the mid upper arm in virgin tissue.  I  dissected down to the artery.  I first encountered a very small  approximately 1-mm artery, which turned out to be high takeoff of her  ulnar artery/brachial artery.  I then proceeded  deeper into the wound  and found a larger artery; however, this too was very small measuring  approximately 1.5 mm.  This artery was mobilized proximally and distally  to have enough room for repair.  At this point, we had to reprep the  patient's leg in order to harvest the vein.  I evaluated her vein with  ultrasound.  She had adequate greater saphenous vein.  Once the right  leg was prepped and draped in standard sterile fashion, a longitudinal  incision was made.  The greater saphenous vein was identified with this  incision.  It was mobilized up to the saphenofemoral junction and then a  second counter incision was made to get further length on the vein.  Side branches of the vein were ligated with 3-0 silk ties.  Once I felt  I had adequate length on the vein, it was ligated proximally and  distally with 2-0 silk ties.  The patient was then taken out on the back  table and distended with heparinized saline.  There were no leaks within  the vein.  Next, I used a straight tunneler to create a tunnel between  the 2 incisions in the arm.  Once this was done, the patient was given  systemic heparinization.  I set up for the proximal anastomosis.  The  vein was set up in a nonreversed fashion.  A longitudinal arteriotomy  was made in the axillary artery.  The axillary artery was at the level  of the chest wall.  An end-to-side anastomosis was performed using  running 6-0 Prolene.  Two repair stitches were required in the toe of  the graft for hemostasis.  Once this was done, the clamps were released.  The anastomosis was hemostatic.  The valvulotome was then used to lyse  the valves.  There was excellent bleeding through the vein.  The vein  was then pulled through the previously created tunnel.  The radial  artery was then occluded proximally and distally, and a longitudinal  arteriotomy was made.  This was extended with Pott scissors.  The vein  was then fashioned appropriately fit to  fit the size of the arteriotomy.  An end-to-side anastomosis was then created with a running 6-0 Prolene.  Prior to completion of the anastomosis, the artery was flushed  retrograde.  There was good backbleeding.  Anastomosis was then secured.  I evaluated the radial artery at the wrist and she had audible Doppler  signal, although it was faint and likely secondary to the girth of the  arm as well as the size of the artery.  I elected to perform an  arteriogram and this revealed opacification of the radial artery going  out across the wrist as well as the opacification of the ulnar artery as  well as the interosseous artery.  The ulnar artery was likely the  smaller of the 2 arteries that was not bypassed.  I did not feel further  revascularization was warranted at this time.  I did reverse the  patient's heparin with 25 mg of protamine.  The butterfly needle was  repaired with a hole and that was used for the angiogram, which was  closed up with a 6-0 Prolene.  Wound was then copiously irrigated.  I  closed the groin with layer of 2-0 Vicryl and staples.  A 15-Blake drain  was placed in the vein harvest site and that was secured with 3-0 nylon.  I also placed a 15-Blake drain in the axillary incision.  Tissue was  reapproximated over the anastomoses with running 2-0 and 3-0 Vicryl.  Staples were used to close the skin.  The patient did have a Doppler  signal on the radial artery once all incisions were closed.  She was  taken back to the intensive care unit in guarded, but stable condition.           ______________________________  V. Leia Alf, MD  Electronically Signed     VWB/MEDQ  D:  11/29/2007  T:  11/30/2007  Job:  [id number]

## 2010-05-21 NOTE — H&P (Signed)
[name], [name]                 ACCOUNT NO.:  [id number]   MEDICAL RECORD NO.:  [id number]          PATIENT TYPE:  INP   LOCATION:  [id number]                         FACILITY:  [facility]   PHYSICIAN:  Judeth Cornfield. Scot Dock, M.D.DATE OF BIRTH:  [birth year]   DATE OF ADMISSION:  10/31/2007  DATE OF DISCHARGE:                              HISTORY & PHYSICAL   REASON FOR ADMISSION:  Bleeding from left arm, status post removal of  left upper arm AV graft.   HISTORY:  This is a pleasant 55 year old woman who had a left upper arm  AV graft placed by Dr. Oneida Alar on October 18, 2007.  She developed  significant swelling in her upper arm and on October 23, 2007, Dr.  Kellie Simmering removed the graft and did a vein patch angioplasty of the  axillary artery.  She states for the last several days she has been  oozing from the incision, and she presented to the emergency department  tonight.  I had removed 2 staples from her incision and opened her wound  and drained some old hematoma.  She continued to have old bloody  drainage from the wound, and I elected to admit her for observation.   Of note, she had end-stage renal disease and dialyzes on Tuesdays,  Thursdays, and Saturdays at [facility] on [facility].   PAST MEDICAL HISTORY:  Significant for:  1. Hypertension.  2. She is HIV positive.  3. End-stage renal disease.  4. Anemia.   She denies any history of diabetes, history of previous MI, history of  congestive heart failure, or history of CVA.   ALLERGIES:  None.   MEDICATIONS:  1. Percocet 5/325 one p.o. q.4 h. p.r.n.  2. Sustiva 600 mg p.o. nightly.  3. Combivent inhaler p.r.n.  4. __________ one p.o. daily.   FAMILY HISTORY:  She has a history of end-stage renal disease in her  family.  Both her mother and her uncle are on dialysis.   SOCIAL HISTORY:  She is single.  She lives with her sister.  She does  not use tobacco.   REVIEW OF SYSTEMS:  Significant for significant  swelling in the left  upper extremity, which she had had since her graft was placed.  She has  had no recent fever or chills that she is aware of.  She had no chest  pain, chest pressure, palpitations, or arrhythmias.  GI, GU, neuro,  hematologic, ENT, and endocrine review of systems are unremarkable.   PHYSICAL EXAMINATION:  VITAL SIGNS:  Blood pressure is 132/58, heart  rate is 78, and temperature is 98.3.  NECK:  Supple.  There is no cervical lymphadenopathy.  I do not detect  any carotid bruits.  LUNGS:  Clear bilaterally to auscultation.  CARDIAC:  She has a regular rate and rhythm.  ABDOMEN:  Soft and nontender.  EXTREMITIES:  Both feet are warm and well perfused without ischemic  ulcers.  Her left upper extremity is significantly swollen.  She has an  axillary incision, which was oozing old dark venous blood.  This had  been packed  and a dressing was applied.   IMPRESSION:  This patient presents with an old hematoma in her left  axilla and is admitted for observation.  She dialyzes on Tuesdays,  Thursdays, and Saturdays, and will have to notify Renal of her  admission.  We will check her coags and follow her hemoglobin closely.  We will consider exploration if there is any evidence of arterial  bleeding, although currently all appears to be old hematoma.       Judeth Cornfield. Scot Dock, M.D.  Electronically Signed     CSD/MEDQ  D:  11/01/2007  T:  11/01/2007  Job:  [id number]

## 2010-05-21 NOTE — Assessment & Plan Note (Signed)
OFFICE VISIT   [name], [name] A  DOB:  15-Apr-1955                                       06/09/2008  [id number]   Patient presents today for evaluation of recent right arm AV fistula  creation on 05/15/08.  She had extensive complications with her left arm  access.  Vein mapping had shown moderate cephalic veins.  She underwent  a fistula creation on 05/10.  At the time of surgery, she had  exploration of her cephalic vein at the wrist, which was totally  inadequate for anastomosis.  On exploration of antecubital vein, it was  also certainly small to moderate.  The fistula was created with fair  flow.  She has had nonmaturation in the occlusion of her fistula.  I  discussed further options with the patient.  I explained that her only  option for upper arm extremity would be upper arm graft.  Due to the  small vein at the level of the antecubital space, I have recommended  upper arm AV graft placement.  She dialyzes on Tuesday, Thursday, and  Saturday.  Therefore, this will be placed for her on 06/09 as an  outpatient with Dr. Trula Slade.   Rosetta Posner, M.D.  Electronically Signed   TFE/MEDQ  D:  06/09/2008  T:  06/12/2008  Job:  [id number]

## 2010-05-21 NOTE — Op Note (Signed)
NAMECHRISTINEMARIE, [name]                 ACCOUNT NO.:  [id number]   MEDICAL RECORD NO.:  [id number]          PATIENT TYPE:  INP   LOCATION:  [id number]                         FACILITY:  [place]   PHYSICIAN:  Nelda Severe. Kellie Simmering, M.D.  DATE OF BIRTH:  [birth year]   DATE OF PROCEDURE:  11/01/2007  DATE OF DISCHARGE:                               OPERATIVE REPORT   PREOPERATIVE DIAGNOSIS:  Significant bleeding from left axillary wound  status post removal of upper arm loop arteriovenous graft for venous  hypertension.   POSTOPERATIVE DIAGNOSIS:  Significant bleeding from left axillary wound  status post removal of upper arm loop arteriovenous graft for venous  hypertension secondary to partial disruption of vein patch, left  brachial artery.   OPERATION:  Exploration of left axillary wound for hemorrhage with  removal of vein patch from left brachial artery and repair using bovine  patch.   SURGEON:  Nelda Severe. Kellie Simmering, MD   FIRST ASSISTANT:  Jacinta Shoe, PA   ANESTHESIA:  General endotracheal.   Jackson-Pratt wound drain.   BRIEF HISTORY:  This patient had left upper arm loop graft removed  approximately 10 days ago for severe venous hypertension in the left  arm.  It was an upper arm loop graft and artery was repaired with a vein  patch angioplasty.  The patient returned to the hospital with  intermittent bleeding from the left axillary wound and examining the  patient at the bedside, some clot was evacuated from this wound followed  by brisk bleeding and she was taken immediately and urgently to the  operating room.   PROCEDURE:  The patient was taken urgently to the operating room, placed  in a supine position with compression of the left axillary wound  manually to control the bleeding.  After satisfactory general  endotracheal anesthesia was administered, left arm was prepped with  Betadine scrub and solution, the wound was opened, and the artery was  noted to be bleeding in the  depth of the wound where the vein patch had  been placed over the previous Gore-Tex anastomosis after removing the  graft.  Proximal and distal control was obtained manually, and there was  a split in the vein where the hemorrhage was occurring.  3000 units of  heparin was given intravenously.  Artery was occluded proximally and  distally with clamps.  The previous vein patch was removed and after  thoroughly irrigating the artery, a bovine pericardial patch was  obtained and fashioned and sewn into place with 6-0 Prolene.  After  antegrade and retrograde flushing, this was completed, clamps released,  and there was audible Doppler flow at the radial artery  to wrist.  Wound was thoroughly irrigated with saline.  Jackson-Pratt  drain was brought out and inferiorly based stab wound secured with a  silk suture.  The wound closed in layers with interrupted Vicryl and  clips.  Sterile dressing applied.  The patient taken to recovery room in  satisfactory condition.      Nelda Severe Kellie Simmering, M.D.  Electronically Signed     JDL/MEDQ  D:  11/01/2007  T:  11/02/2007  Job:  [id number]

## 2010-05-21 NOTE — Assessment & Plan Note (Signed)
OFFICE VISIT   [name], [name]  DOB:  07-13-1955                                       03/06/2008  [id number]   HISTORY:  This is [name] 55 year old female who presented in extremis for  bleeding upper arm dialysis access site.  She had undergone multiple  patch repairs of the graft and ultimately performed axillary to radial  artery bypass graft with vein from her right leg.  She developed [name]  seroma in her right leg from the vein harvest site.  This had to be  opened in the operating room and debrided.  She has been using VAC  therapy.  She comes back for followup.  The wounds are nearly healed.  I  have placed silver nitrate on the small wounds.  She no longer needs the  wound VAC.  I expect these will be fully healed in the next several  weeks.  I will not schedule her for followup.  She is getting routine  ultrasound surveillance of her bypass graft.   Eldridge Abrahams, MD  Electronically Signed   VWB/MEDQ  D:  03/06/2008  T:  03/08/2008  Job:  [id number]   cc:   [facility]

## 2010-05-21 NOTE — Discharge Summary (Signed)
[name], [name]                 ACCOUNT NO.:  [id number]   MEDICAL RECORD NO.:  [id number]          PATIENT TYPE:  INP   LOCATION:  [id number]                         FACILITY:  [place]   PHYSICIAN:  Judeth Cornfield. Scot Dock, M.D.DATE OF BIRTH:  [birth year]   DATE OF ADMISSION:  10/31/2007  DATE OF DISCHARGE:  11/05/2007                               DISCHARGE SUMMARY   DISCHARGE DIAGNOSES:  1. Bleeding from left arm, status post removal of left upper arm      arteriovenous graft.  2. Hypertension.  3. Human immunodeficiency virus positive.  4. End-stage renal disease on hemodialysis.  5. Anemia.   PROCEDURES PERFORMED:  Exploration of left axillary wound drainage with  removal of vein patch, left brachial artery and repair of left brachial  artery using bovine patch, November 01, 2007, by Dr. Kellie Simmering.   COMPLICATIONS:  None.   DISCHARGE MEDICATIONS:  She was instructed to resume her:  1. Sustiva at 600 mg p.o. nightly.  2. Combivent inhaler p.r.n.  3. Dalyvite p.o. daily.  4. Tylox 1-2 p.o. q.4 h. p.r.n. pain, total #30 was given.  5. Of note, during dialysis, she has been receiving Aranesp 200 mcg on      Tuesdays.  6. Hectorol 2 mcg Tuesdays, Thursdays, and Saturdays.  7. Vancomycin 1 g IV with dialysis.  8. Fortaz 2 g IV with dialysis.   CONDITION AT DISCHARGE:  Stable, improving.   DISPOSITION:  She is being discharged home in stable condition.  She is  given careful instructions regarding the care of her wounds.  Home  health nursing was arranged for wound care of left upper extremity by  using 4 x 4 gauze, Kerlix, and Ace wraps on a daily basis.  Home health  occupational therapy is consulted for assistance with left upper  extremity function.  She is given careful instructions regarding her  activities.  She is to continue with her dialysis Tuesdays, Thursdays,  and Saturdays.  She is to see Dr. Kellie Simmering in 2 weeks and the office will  arrange a visit.   BRIEF IDENTIFYING  STATEMENT:  For complete details, please refer the  typed history and physical.  Briefly, this very pleasant 55 year old  woman underwent placement of a left upper arm AV graft on October 18, 2007.  On October 23, 2007, Dr. Kellie Simmering removed the graft and performed a  vein patch angioplasty on the axillary artery.  However, she experienced  oozing from the site and presented to the emergency department.  She was  admitted for evaluation and treatment.   HOSPITAL COURSE:  She was admitted to a bed on a surgical convalescent  floor.  She was found to have old bloody drainage from the left axillary  wound.  Dressing changes were initiated.  We do appreciate the Renal  Service for assistance with the patient.  On November 01, 2007, she was  found to have a large hematoma.  Following exploration of the hematoma,  there was an increase in bleeding.  She was taken to the emergency room  urgently for exposure.  In the  operating room, she was found to have  bleeding from the left axillary artery.  She underwent removal of the  vein patch with bovine patch angioplasty.  For complete details, please  refer to the typed operative report.  The procedure was without  complications.  She was returned to the [facility]  extubated.  Following stabilization, she was transferred to a bed on a  surgical convalescent floor.  She did experience some acute blood loss  anemia.  She did receive 2 units of blood during dialysis.  On November 05, 2007, after her drain had been removed, she was feeling much better  and was desirous of discharge.  She was stable and was discharged home  in stable condition.      Chad Cordial, PA      Judeth Cornfield. Scot Dock, M.D.  Electronically Signed    KEL/MEDQ  D:  11/05/2007  T:  11/06/2007  Job:  [id number]   cc:   Nelda Severe. Kellie Simmering, M.D.

## 2010-05-21 NOTE — Assessment & Plan Note (Signed)
OFFICE VISIT   [name], [name]  DOB:  Aug 29, 1955                                       10/06/2007  [id number]   The patient is [name] 55 year old female referred by Dr. Moshe Cipro for  evaluation for placement of long-term hemodialysis access.  The patient  is [name] 55 year old female with renal failure thought to be secondary to  HIV.  She also has [name] history of hypertension and anemia.  Most recent  creatinine was 5.7.   MEDICATIONS:  1. Aleve.  2. Niferex 150 mg twice [name] day.  3. Furosemide 40 mg once [name] day.  4. Metoprolol 50 mg twice [name] day.  5. Amlodipine 10 mg once [name] day.  6. Sustiva 600 mg once [name] day.  7. Combivir 150/300 twice [name] day.  8. Procrit 20,000 units once [name] week.  9. Hectorol 1 mcg once [name] day.   PAST MEDICAL HISTORY:  Past medical history is otherwise fairly  unremarkable.   She is right-handed.   PHYSICAL EXAMINATION:  On physical exam today she has 2+ brachial and 1+  radial pulses bilaterally.  On placement of tourniquet on the upper arm  the veins are fairly small.  She had [name] vein mapping of her left upper  arm today which confirmed that the veins are quite small in the left  upper arm.   I believe the patient's best option is to place [name] left forearm AV graft.  We have scheduled this for 10/18/2007.  Risks, benefits, possible  complications and procedure details were explained to the patient.  She  understands and agrees to proceed.  These included but were not limited  to bleeding, infection, graft thrombosis rate of 25% in 1 year.   Jessy Oto. Fields, MD  Electronically Signed   CEF/MEDQ  D:  10/06/2007  T:  10/07/2007  Job:  [id number]   cc:   Louis Meckel, M.D.

## 2010-05-21 NOTE — Discharge Summary (Signed)
[name], [name]                 ACCOUNT NO.:  [id number]   MEDICAL RECORD NO.:  [id number]          PATIENT TYPE:  INP   LOCATION:  6707                         FACILITY:  [facility]   PHYSICIAN:  Judeth Cornfield. Scot Dock, M.D.DATE OF BIRTH:  1955-08-27   DATE OF ADMISSION:  01/19/2008  DATE OF DISCHARGE:  01/25/2008                               DISCHARGE SUMMARY   ADMISSION DIAGNOSES:  1. Infected lymphocele, right thigh.  2. End-stage renal disease.  3. Human immunodeficiency virus positive.   FINAL DISCHARGE DIAGNOSES:  1. Infected lymphocele, right thigh status post incision and drainage      with (cultures showed Staphylococcus species coagulase negative      with no anaerobes).  2. End-stage renal disease, hemodialysis on Tuesdays, Thursdays, and      Saturdays at [facility]  3. Human immunodeficiency virus positive.  4. Anemia of chronic disease.  5. Cardiomyopathy.  6. Secondary hyperparathyroidism.  7. Hypertension.  8. History of left axillary artery to radial artery bypass with      nonreversed greater saphenous vein graft from the right thigh by      Dr. Trula Slade on November 29, 2007, (done secondary to ruptured      axillary artery with dehiscence of left brachial artery patch      angioplasty which was done following removal of left upper arm      atrioventricular graft secondary to venous hypertension).   CONSULTS:  1. Nephrology.  2. Case management.  3. Wound care nurse.   PROCEDURES:  Incision and drainage of 2 large lymphoceles, right thigh  and placement of wound VAC by Dr. [name] on January 19, 2008.   BRIEF HISTORY:  [name] is a 55 year old black female who had multiple  problems of her left upper arm access as mentioned in her discharge  diagnosis.  On January 19, 2008, she was seen at the emergency room with  complaints of fever and increasing pain in her right thigh, which have  been going on for several days.   Fever was up to 103.  She was evaluated  by on-call vascular surgeon, Dr. [name] and felt that she  had infected lymphocele and should be admitted for incision and  drainage.   HOSPITAL COURSE:  [name] was admitted to [facility] on  January 19, 2008.  She underwent the previously mentioned procedure.  Postoperatively, she was placed on IV vancomycin and Zosyn.  We were  awaiting culture results.  Nephrology was consulted to manage her end-  stage renal disease.  Ultimately, cultures showed Staphylococcus  species, coagulase negative.  At that point, she had been on antibiotics  for nearly a week and felt she should proceed with total of 2 weeks of  antibiotics.  The nephrologist discontinued her Zosyn and have planned  to continue her vancomycin for a total of 2 weeks.  Wound care nurse was  consulted to assist with wound VAC dressing changes.  As of January 24, 2008, her right thigh wound had a 100% red tissue, no odor with  moderate  serous exudate.  Her left arm incisions from her previous surgery felt  to be healing adequately.  She has had a scant amount of drainage from  her axillary incision per the patient's account; however, this is clean  and dry in the hospital.  We have been cleaning this daily with saline  and placing a dry gauze dressing over this.  She has had a 2+ radial  pulse.  Currently, her pain has been controlled on medications.   There was confusion on her home HIV medication regimen.  Pharmacist did  contact [place] who confirmed she was on Viread, Sustiva, and Epivir.   DISPOSITION:  At the time of this dictation, the patient remained in  stable and improving condition.  She has been afebrile.  Wound appears  to be healing adequately with a wound VAC.  Once the wound VAC has been  approved for home, we plan that she could be discharged, possibly seen  on January 25, 2008, or within the preceding 24 hours.   LABORATORY DATA:  Culture  results as discussed above.  She had hepatitis  B surface antigen, which was negative.  Sodium is 135, potassium 3.6,  glucose 79, BUN 26, creatinine 6.82, albumin 1.7, calcium 8.7, and  phosphorus 3.4.  White count of 5.0, hemoglobin 9.7, hematocrit 30.4,  and platelet count of 424.   DISCHARGE MEDICATIONS:  1. Sustiva 600 mg p.o. nightly.  2. Nephro-Vite 1 daily.  3. Epivir 50 mg nightly.  4. Zerit 20 mg at bedtime.  5. Oxycodone 5 mg 1-2 tablets p.o. q.4.h. p.r.n. pain.  6. Hemodialysis medication Hectorol 3.5 mcg IV at each dialysis.  7. Aranesp 200 mcg IV weekly at dialysis (Thursdays).  8. Vancomycin 1000 mg IV at each dialysis to continue for 2 weeks      total (started on January 20, 2008).   DISCHARGE INSTRUCTIONS:  She is to continue renal appropriate diet,  increase activity slowly, sponge bathing, should avoid driving for the  next couple of weeks.  She will see Dr. Scot Dock or Dr. Trula Slade within  the next 2 weeks, but should call sooner if she develops fever greater  than 101 or redness or purulent drainage from her thigh wound.  Home  care will assist with Monday, Wednesday, and Friday wound VAC dressing  changes of her right thigh.  Home health nurse and occupational therapy  have been arranged.      Jacinta Shoe, P.A.      Judeth Cornfield. Scot Dock, M.D.  Electronically Signed    AWZ/MEDQ  D:  01/25/2008  T:  01/26/2008  Job:  [id number]   cc:   [facility]

## 2010-05-21 NOTE — H&P (Signed)
NAMEEVERGREEN, VARUGHESE                 ACCOUNT NO.:  [id number]   MEDICAL RECORD NO.:  [id number]          PATIENT TYPE:  INP   LOCATION:  6707                         FACILITY:  [facility]   PHYSICIAN:  Judeth Cornfield. Scot Dock, M.D.DATE OF BIRTH:  1955-02-22   DATE OF ADMISSION:  01/19/2008  DATE OF DISCHARGE:                              HISTORY & PHYSICAL   REASON FOR ADMISSION:  Infected lymphocele, right thigh.   HISTORY:  This is a 55 year old woman who had multiple problems with her  left upper arm access.  She had multiple bleeding episodes and most  recently had to undergo axillary artery-to-radial artery bypass with  vein harvested from the right thigh.  She had been having fevers at home  and presented to the emergency department with increasing pain in the  right thigh, which she had had for several days, this pain was constant.  There were no aggravating or alleviating factors, and she had a fever up  to 103 at home.  She was seen by the emergency department physician, and  Vascular Surgery was consulted to evaluate her.  After examination, it  was felt that she had an infected lymphocele and she was admitted for  incision and drainage of her lymphocele.   She currently has a Diatek catheter for dialysis in the right IJ.   Her past medical history is significant for:  1. HIV.  2. Hypertension.  3. Chronic kidney disease and she dialyzes on Tuesdays, Thursdays, and      Saturdays.  4. Anemia.  5. She denies any history of diabetes, hypercholesterolemia, history      of previous myocardial infarction, history of congestive heart      failure.   ALLERGIES:  No known drug allergies.   MEDICATIONS:  1. Sustiva 600 mg p.o. nightly.  2. Combivent inhaler p.r.n.  3. Dialyvite 1 p.o. daily.  4. Hectorol 2 mcg Tuesdays, Thursdays, and Saturdays.   FAMILY HISTORY:  She has a history of end-stage renal disease in the  family.  Both mother and uncle were on dialysis prior to  death.   SOCIAL HISTORY:  She is single.  She lives with her sister.  She does  not use tobacco.   REVIEW OF SYSTEMS:  CONSTITUTIONAL:  She has had fever and chills.  Recently, she has had no weight loss or weight gain.  CARDIAC:  She has  had no chest pain, chest pressure, palpitations, or arrhythmias.  PULMONARY:  She has had no bronchitis, asthma, or wheezing.  GU: She  does not make significant urine.  GI:  She has had no recent change in  her bowel habits or history of peptic ulcer disease.  NEURO:  She has  had no dizziness, blackouts, headaches, or seizures.  HEMATOLOGIC:  She  has had no coagulopathies that she is aware of.   PHYSICAL EXAMINATION:  Initially, this is a pleasant 55 year old woman  who appears her stated age.  Initial temperature in the emergency  department was 102.5 with a heart rate of 120, blood pressure of 160/71.  HEENT is unremarkable.  Neck  is supple.  There is no cervical  lymphadenopathy.  Lungs are clear bilaterally to auscultation.  On  cardiac exam, she has a regular rate and rhythm.  Her abdomen is soft  and nontender.  She has significant swelling in her right thigh with  erythema and warmth suggesting an infected lymphocele.  She has  lymphedema bilaterally.  Both feet appear adequately perfused.   IMPRESSION:  This patient presents with an infected lymphocele in her  right thigh likely related to previous vein harvest site.   PLAN:  Incision and drainage of the lymphocele, and she may very well  require placement of a VAC to control the drainage.  She will be  admitted.  We will notify [facility] of her admission.  She normally dialyzes  on Tuesdays, Thursdays and Saturdays.      Judeth Cornfield. Scot Dock, M.D.  Electronically Signed     CSD/MEDQ  D:  01/19/2008  T:  01/20/2008  Job:  [id number]

## 2010-05-21 NOTE — Assessment & Plan Note (Signed)
OFFICE VISIT   [name], [name]  DOB:  12-04-1955                                       12/20/2007  [id number]   REASON FOR VISIT:  Follow-up.   HISTORY:  This is [name] 55 year old female who presented to the emergency  department in extremis, having significant bleeding from her left upper  arm dialysis access site.  The patient initially had an AV graft placed  by Dr. Oneida Alar on October 18, 2007.  The graft had be removed and artery  was patched with [name] vein, this was done on October 17th.  Her patch blew  out and this was repaired with bovine pericardium on October 26th.  She  presented to the emergency department bleeding again on November 23rd.  She underwent left axillary artery-to-radial artery bypass graft with  non-reversed greater saphenous from the right leg.  She had blown out  her bovine patch.  She comes in today for follow-up.  She has been  having significant drainage from her left arm.  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  I  debrided some of the tissue in this region.  I debrided her back to  healthy-appearing tissue, this will need to be packed with wet-to-dry  dressings twice [name] day.  The patient's arm is viable.  I cannot palpate  the bypass graft or the radial pulse.  However, the patient's hand is  warm and fully functional.   PLAN:  I am scheduling the patient to get home health set up for wet-to-  dry dressing changes twice [name] day.  She will continue with her antibiotic  therapy at the dialysis center.  I am going to see her back in 3 weeks  for [name] wound check.  She is also going to need to be put on our bypass  graft surveillance protocol, given her upper arm bypass graft.  I  counseled her about the signs and symptoms of the bypass graft occluding  and I told her to come see me if any of those happen.   Eldridge Abrahams, MD  Electronically Signed   VWB/MEDQ  D:  12/20/2007   T:  12/21/2007  Job:  [id number]   cc:   Louis Meckel, M.D.

## 2010-05-21 NOTE — Assessment & Plan Note (Signed)
OFFICE VISIT   [name], [name] A  DOB:  01/15/1955                                       07/17/2008  [id number]   REASON FOR VISIT:  Evaluate dialysis graft.   HISTORY:  This is a 55 year old female well-known to our practice who on  June 14, 2008, underwent a right upper arm AV Gore-Tex graft.  We were  called today by her dialysis center that she needed to be seen  emergently for evaluation of her graft.  On examination, there is  absolutely nothing wrong with her graft, there is an excellent thrill.  I think it can be accessed tomorrow.   Eldridge Abrahams, MD  Electronically Signed   VWB/MEDQ  D:  07/17/2008  T:  07/18/2008  Job:  [id number]

## 2010-05-21 NOTE — H&P (Signed)
[name], [name]                 ACCOUNT NO.:  [id number]   MEDICAL RECORD NO.:  [id number]          PATIENT TYPE:  INP   LOCATION:  [place]                         FACILITY:  [facility]   PHYSICIAN:  Jana Hakim, M.D. DATE OF BIRTH:  [birth year]   DATE OF ADMISSION:  03/19/2007  DATE OF DISCHARGE:                              HISTORY & PHYSICAL   PRIMARY CARE PHYSICIAN:  Unassigned.   CHIEF COMPLAINT:  Cough.   HISTORY OF PRESENT ILLNESS:  This is a 55 year old female who was  brought to the emergency department by her family secondary to  complaints of increased cough, chest pain, and chest congestion for the  last 3 days. The patient denies having any fever or chills. She does  report having cough productive of clear sputum. She denies having any  shortness of breath.   PAST MEDICAL HISTORY:  None.   PAST SURGICAL HISTORY:  None.   MEDICATIONS:  None.   ALLERGIES:  NO KNOWN DRUG ALLERGIES.   SOCIAL HISTORY:  The patient is disabled and she will not disclose at  this time why she is disabled. She is a non-smoker, non-drinker.   FAMILY HISTORY:  Positive for coronary artery disease and hypertension  along with cancer in her mother and positive for diabetes mellitus in  both parents.   REVIEW OF SYSTEMS:  Pertinent as above.   PHYSICAL EXAMINATION:  GENERAL:  This is a 55 year old older than stated  age appearing, obese female in discomfort but no acute distress.  VITAL SIGNS:  Temperature 99.2, blood pressure 138/84, heart rate 94,  respiratory rate 20, O2 saturation 97% on room air.  HEENT:  Normocephalic and atraumatic. Pupils are equal, round, and  reactive to light. Extraocular muscles intact. Oropharynx clear.  NECK:  Supple.  Full range of motion. No thyromegaly, adenopathy, or  jugular venous distention.  CARDIOVASCULAR:  Regular rate and rhythm. No murmur, rub, or gallop.  LUNGS:  Clear to auscultation bilaterally.  ABDOMEN:  Positive bowel sounds. Soft,  nontender, and nondistended.  EXTREMITIES:  3+ edema of the left lower extremity. Worse in the distal  aspect of the lower extremities. There aer venous stasis changes in the  left lower leg and calf area. These are consistent with lymph edema.  NEUROLOGIC:  Alert and oriented. There are no focal deficits on  examination.  SKIN:  Diffuse hyperpigmented plaques on the back. The patient reports  this is from chicken pox as a child.   LABORATORY DATA:  White blood cell count 7.4. Hemoglobin and hematocrit  8.6 and 25.0. Platelets 267,000. Neutrophils 76%, MCV 93.6. Chemistry  with sodium of 132, potassium 3.2, chloride 104, bicarb 25, BUN 28,  creatinine 3.31 and glucose 118.   Chest x-ray findings reveal a right middle lobe air space disease  findings.   ASSESSMENT:  A 55 year old female being admitted with:  1. Pneumonia.  2. Anemia.  3. Hypokalemia.  4. Chronic renal insufficiency with acute renal failure.  5. Lymphedema of the left lower extremity.   PLAN:  The patient will be admitted and placed on IV antibiotic therapy  with Rocephin and Azithromycin. Nebulizer treatments have been ordered  along with supplemental oxygen therapy. The patient will be placed on IV  fluids and will have correction of her electrolytes. An anemia workup  will also be started. The patient will be transfused as needed. DVT and  GI prophylaxis have also been ordered. Of the utmost importance is that  the patient establish followup with a primary care physician for  management of her medical problems.      Jana Hakim, M.D.  Electronically Signed     HJ/MEDQ  D:  03/20/2007  T:  03/20/2007  Job:  [id number]

## 2010-05-24 ENCOUNTER — Emergency Department (HOSPITAL_COMMUNITY): Payer: Medicaid Other

## 2010-05-24 ENCOUNTER — Inpatient Hospital Stay (HOSPITAL_COMMUNITY)
Admission: EM | Admit: 2010-05-24 | Discharge: 2010-05-31 | DRG: 974 | Disposition: A | Discharge status: Home / Self Care | Payer: Medicaid Other | Attending: Internal Medicine | Admitting: Internal Medicine

## 2010-05-24 DIAGNOSIS — Z992 Dependence on renal dialysis: Secondary | ICD-10-CM

## 2010-05-24 DIAGNOSIS — I5020 Unspecified systolic (congestive) heart failure: Secondary | ICD-10-CM | POA: Diagnosis present

## 2010-05-24 DIAGNOSIS — A419 Sepsis, unspecified organism: Secondary | ICD-10-CM | POA: Diagnosis present

## 2010-05-24 DIAGNOSIS — I509 Heart failure, unspecified: Secondary | ICD-10-CM | POA: Diagnosis present

## 2010-05-24 DIAGNOSIS — R599 Enlarged lymph nodes, unspecified: Secondary | ICD-10-CM | POA: Diagnosis present

## 2010-05-24 DIAGNOSIS — Z7982 Long term (current) use of aspirin: Secondary | ICD-10-CM

## 2010-05-24 DIAGNOSIS — N059 Unspecified nephritic syndrome with unspecified morphologic changes: Secondary | ICD-10-CM | POA: Diagnosis present

## 2010-05-24 DIAGNOSIS — I120 Hypertensive chronic kidney disease with stage 5 chronic kidney disease or end stage renal disease: Secondary | ICD-10-CM | POA: Diagnosis present

## 2010-05-24 DIAGNOSIS — E878 Other disorders of electrolyte and fluid balance, not elsewhere classified: Secondary | ICD-10-CM | POA: Diagnosis not present

## 2010-05-24 DIAGNOSIS — I517 Cardiomegaly: Secondary | ICD-10-CM | POA: Diagnosis present

## 2010-05-24 DIAGNOSIS — N2581 Secondary hyperparathyroidism of renal origin: Secondary | ICD-10-CM | POA: Diagnosis present

## 2010-05-24 DIAGNOSIS — E871 Hypo-osmolality and hyponatremia: Secondary | ICD-10-CM | POA: Diagnosis not present

## 2010-05-24 DIAGNOSIS — D472 Monoclonal gammopathy: Secondary | ICD-10-CM | POA: Diagnosis present

## 2010-05-24 DIAGNOSIS — D638 Anemia in other chronic diseases classified elsewhere: Secondary | ICD-10-CM | POA: Diagnosis present

## 2010-05-24 DIAGNOSIS — B20 Human immunodeficiency virus [HIV] disease: Secondary | ICD-10-CM | POA: Diagnosis present

## 2010-05-24 DIAGNOSIS — A409 Streptococcal sepsis, unspecified: Principal | ICD-10-CM | POA: Diagnosis present

## 2010-05-24 DIAGNOSIS — D61818 Other pancytopenia: Secondary | ICD-10-CM | POA: Diagnosis present

## 2010-05-24 DIAGNOSIS — A0472 Enterocolitis due to Clostridium difficile, not specified as recurrent: Secondary | ICD-10-CM | POA: Diagnosis present

## 2010-05-24 DIAGNOSIS — N186 End stage renal disease: Secondary | ICD-10-CM | POA: Diagnosis present

## 2010-05-24 DIAGNOSIS — R6520 Severe sepsis without septic shock: Secondary | ICD-10-CM | POA: Diagnosis present

## 2010-05-24 DIAGNOSIS — R609 Edema, unspecified: Secondary | ICD-10-CM | POA: Diagnosis present

## 2010-05-24 LAB — COMPREHENSIVE METABOLIC PANEL
ALT: 24 U/L (ref 0–35)
AST: 64 U/L — ABNORMAL HIGH (ref 0–37)
Albumin: 2.4 g/dL — ABNORMAL LOW (ref 3.5–5.2)
Alkaline Phosphatase: 121 U/L — ABNORMAL HIGH (ref 39–117)
BUN: 45 mg/dL — ABNORMAL HIGH (ref 6–23)
CO2: 23 mEq/L (ref 19–32)
Calcium: 8.2 mg/dL — ABNORMAL LOW (ref 8.4–10.5)
Chloride: 98 mEq/L (ref 96–112)
Creatinine, Ser: 8.29 mg/dL — ABNORMAL HIGH (ref 0.4–1.2)
GFR calc Af Amer: 6 mL/min — ABNORMAL LOW (ref >60)
GFR calc non Af Amer: 5 mL/min — ABNORMAL LOW (ref >60)
Glucose, Bld: 93 mg/dL (ref 70–99)
Potassium: 4.2 mEq/L (ref 3.5–5.1)
Sodium: 134 mEq/L — ABNORMAL LOW (ref 135–145)
Total Bilirubin: 0.4 mg/dL (ref 0.3–1.2)
Total Protein: 9.3 g/dL — ABNORMAL HIGH (ref 6.0–8.3)

## 2010-05-24 LAB — DIFFERENTIAL
Basophils Absolute: 0 10*3/uL (ref 0.0–0.1)
Basophils Relative: 1 % (ref 0–1)
Eosinophils Absolute: 0 10*3/uL (ref 0.0–0.7)
Eosinophils Relative: 0 % (ref 0–5)
Lymphocytes Relative: 34 % (ref 12–46)
Lymphs Abs: 0.7 10*3/uL (ref 0.7–4.0)
Monocytes Absolute: 0.2 10*3/uL (ref 0.1–1.0)
Monocytes Relative: 9 % (ref 3–12)
Neutro Abs: 1.2 10*3/uL — ABNORMAL LOW (ref 1.7–7.7)
Neutrophils Relative %: 56 % (ref 43–77)

## 2010-05-24 LAB — CBC
HCT: 39.4 % (ref 36.0–46.0)
Hemoglobin: 12.6 g/dL (ref 12.0–15.0)
MCH: 33.8 pg (ref 26.0–34.0)
MCHC: 32 g/dL (ref 30.0–36.0)
MCV: 105.6 fL — ABNORMAL HIGH (ref 78.0–100.0)
Platelets: 88 10*3/uL — ABNORMAL LOW (ref 150–400)
RBC: 3.73 MIL/uL — ABNORMAL LOW (ref 3.87–5.11)
RDW: 16.5 % — ABNORMAL HIGH (ref 11.5–15.5)
WBC: 2.2 10*3/uL — ABNORMAL LOW (ref 4.0–10.5)

## 2010-05-24 LAB — PROCALCITONIN: Procalcitonin: 37.55 ng/mL

## 2010-05-24 LAB — LACTIC ACID, PLASMA: Lactic Acid, Venous: 2.3 mmol/L — ABNORMAL HIGH (ref 0.5–2.2)

## 2010-05-24 LAB — LIPASE, BLOOD: Lipase: 41 U/L (ref 11–59)

## 2010-05-24 NOTE — Discharge Summary (Signed)
[place]  Patient:    [name], [name]                        MRN: [id number] Adm. Date:  [id number] Disc. Date: [id number] Attending:  Phifer, Izora Gala Welcome Dictator:   Tania Ade, M.D. CC:         C. Milta Deiters, M.D.   Discharge Summary  DISCHARGE DIAGNOSES: 1. Left lower extremity cellulitis. 2. Obstructive sleep apnea, status post sleep study in May 2002. 3. Essential hypertension, stable. 4. Morbid obesity, weight 350 pounds. 5. Unconjugated hyperbilirubinemia. 6. Normocytic anemia, hemoglobin baseline is 10.1.  DISCHARGE MEDICATIONS: 1. Lasix 40 mg one p.o. q.d. 2. Lotensin 10 mg one p.o. q.d. 3. Keflex 500 mg one p.o. q.6h. for a total of 10 days. 4. Lamisil cream to apply to foot for tenia pedis b.i.d. 5. Udder cream for dry skin.  DIET:  Discharged on low fat diet.  SPECIAL INSTRUCTION:  She is advised to wear foot wear while walking outside. She is advised to keep her foot clean.  Elevate the leg when in bed.  She is advised to use her CPAP machine that will be fitted for her by the respiratory therapist.  She is to use this every day at bedtime with her breathing.  FOLLOWUP:  She has a follow-up appointment with Dr. Milta Deiters on June 30, 2000.  She needs to keep that appointment at that time.  He will need to evaluate if her leg swelling has gotten better.  Upon discharge, her leg swelling on the left side at 12 inches above the lateral left malleolus was 21 inches.  This needs to be evaluated.  Erythema and warmth needs to be checked. We need to check to see if her tinea pedis has improved.  We need to check her compliance with the CPAP machine and other regular outpatient medical issues need to be addressed then.  PROCEDURE:  Bilateral venous Dopplers that were negative on June 21, 2000.  HISTORY OF PRESENT ILLNESS:  This is a 55 year old, African-American female with a history of chronic leg edema who presented  with complaints of left leg pain, fever and inability to walk with decreased swelling, redness that started the day prior to arrival.  She was felt to have subjective fevers and she was unable to walk.  PAST MEDICAL HISTORY: 1. Hypertension. 2. Chronic leg edema. 3. Morbid obesity.  MEDICATIONS: 1. Lasix 40 mg q.d. 2. Lotensin 10 mg q.d.  SOCIAL HISTORY:  She lives alone on Disability.  She studied until 10th grade. She has two daughters and she does not have home health.  She does have Medicaid.  She denies tobacco, alcohol, intravenous or recreational drug use.  FAMILY HISTORY:  Her mom is alive with diabetes.  Sister has a history of pulmonary embolus.  ALLERGIES:  No known drug allergies.  PHYSICAL EXAMINATION:  VITAL SIGNS:  Temperature 102.1, heart rate 112, blood pressure 117/56, respiratory rate 20, oxygen saturations 93% on room air.  GENERAL:  She is a heavy built, African-American female.  CARDIAC:  Tachycardic with an ejection systolic murmur at 3/6.  EXTREMITIES:  Left leg that was much larger than the right.  Erythematous and tender to touch.  Redness below the knee.  No ulcerations or excoriations. She had pitting edema.  She had dorsalis pedis pulses present bilaterally at 2+ with decreased sensation.  Her girth was 22 inches at 12 inches above the lateral  malleolus of the left side.  Nails showed onychomycosis of the feet and nails.  LABORATORY DATA AND X-RAY FINDINGS:  White count 19.1, hemoglobin 10.1, hematocrit 19, MCV 94, platelets 269.  Sodium 137, potassium 3.0, chloride 100, bicarb 29, BUN 11, creatinine 1.3, glucose 106.  Blood cultures x 2 were negative.  ABG on room air was pH 7.511, pCO2 36, pO2 76, bicarb 29.  AST 81, ALT 40, Alk phos 75, bilirubin 2.3, protein 6.4, albumin 2.6.  Chest x-ray showed a bibasilar atelectasis with perivascular thickening, otherwise normal. EKG showed a normal sinus rhythm at 76 beats per minute.  ST and T  wave abnormalities with prolonged QT with QT interval of 436.  Bilateral lower extremity Dopplers were negative.  D-dimer was 2.71.  Blood cultures were negative.  HOSPITAL COURSE:  #1 - CELLULITIS:  She was admitted to a regular bed.  She received blood cultures that were negative.  She received intravenous Ancef and she felt better with that.  Her white count came down from 19 to 12.3 on the day of discharge.  She felt much better with leg swelling, tenderness and redness that had improved.  She also received Lamisil cream to apply to treat fungal infection which is likely the cause of her cellulitis.  She is also advised to keep her leg end of the bed elevated.  She was emphasized on the importance of maintaining pedal hygiene as she at a high risk of developing recurrent cellulitis frequently.  #2 - HYPERTENSION:  Hypertension appeared stable during admission.  She is advised to continue Lotensin.  #3 - OBESITY:  We did get a nutrition consult and kept her on low fat diet, but she needs to be more motivated to lose weight than she is currently. Gastric surgery might be an option for her which could be addressed as an outpatient.  #4 - DYSPNEA:  She did appear a little short of breath when she came in. However, the ABG showed a pO2 of 76 which is a little low for her baseline for her age and weight.  We did get the results of the sleep study that was done on May 24, 2000, with impression of which was severe obstructive sleep apnea and hypoxic syndrome with an RDI of 71.8 per hour, 26 obstructive apneas, 217 hypopneas before CPAP control.  She had loud snoring with severe oxygen desaturation, occasional PVCs.  Her CPAP was titrated to 15 cm of water, RDI four per hour using medium soft gel mask and chin strap preventing snoring and holding saturation at 90%.  Hence, the cause for her dyspnea was likely the obstructive sleep apnea.  She was given a trial for one night with CPAP  and  she slept very comfortably and was excited about trying this machine at home. We have a Education officer, museum to help with getting [employer] to help fit the mask for her and teach her how to use it.  That will be done before the patient gets discharge.  #5 - UNCONJUGATED HYPERBILIRUBINEMIA:  She did have an elevated haptoglobin and it was unlikely that she was hemolyzing.  Her fractionated bilirubin was more unconjugated than conjugated.  It could be a stable finding than before and it is questionable that she has Gilberts syndrome.  #6 - ANEMIA:  She has a normocytic anemia with a normal ferritin.  She does complain of heavy periods, but this is unlikely that this is from iron deficiency.  It is also likely  that this is from her chronic obesity.  #7 - RULE OUT DEEP VENOUS THROMBOSIS AND PULMONARY EMBOLUS:  Her sister did have a history of PE, but her Dopplers were negative.  She did have a positive D-dimer, but her probability was really low for PE so we did not address this any further.  She has never had a DVT in the past.  DISCHARGE PHYSICAL EXAMINATION:  Temperature 98.2, heart rate 75, blood pressure 110/70, respiratory rate 16, oxygen saturations 97% on room air.  DISCHARGE LABORATORY DATA AND X-RAY FINDINGS:  White count 12.3, hemoglobin 9.0, hematocrit 26, MCV 94, platelets 246.  Total bilirubin 2.1, direct bilirubin 0.6 and direct bilirubin 2.0.  Sodium 134, potassium 3.9, chloride 102, bicarb 27, BUN 14, creatinine 1.1, glucose 102, calcium 8.2.  CONDITION ON DISCHARGE:  Stable.  DISPOSITION:  Discharged home with her sister. DD:  06/23/00 TD:  06/23/00 Job: [id number]

## 2010-05-24 NOTE — Discharge Summary (Signed)
[name], [name]                           ACCOUNT NO.:  [id number]   MEDICAL RECORD NO.:  [id number]                   PATIENT TYPE:  INP   LOCATION:  [id number]                                 FACILITY:  [facility]   PHYSICIAN:  Evette Doffing, M.D.               DATE OF BIRTH:  [birth year]   DATE OF ADMISSION:  06/24/2002  DATE OF DISCHARGE:  07/04/2002                                 DISCHARGE SUMMARY   DISCHARGE DIAGNOSES:  1. Left lower extremity cellulitis.  2. 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  3. Anemia, most likely chronic disease.  4. Obstructive sleep apnea.  5. Obesity.  6. Hypertension.  7. Diarrhea, resolved.  8. Hypokalemia probably secndary to diuretics, resolved.  9. Increased indirect bilirubin.  10.      Low albumin with proteinuria.   DISCHARGE MEDICATIONS:  1. Lotensin 10 mg p.o. daily..  2. Lamisil cream to the toes twice daily.  3. Cephalexin 500 mg by mouth 4 times a day.  4. Clindamycin 300 mg by mouth 4 times a day for 10 days.  5. Tylenol as needed for pain.   CONDITION ON DISCHARGE:  The patient was discharged in fair condition. She  was asked to keep her legs elevated as much as possible.  She was asked to  follow up in [facility] with Dr. Jacquelynn Cree on July 18, 2002, at 10:45 a.m.   PROCEDURES:  1. July 02, 2002, lower extremity venous evaluation which showed no obvious     evidence of DVT, superficial cyst, baker's cyst, and no change from June 24, 2002.  She has enlarged lymph nodes in the inguinal area.  2. On 07/01/2002, the patient underwent arterial Dopplers of the lower     extremities which essentially showed good arterial blood flow to the     right lower extremity, minimally decreased blood flow to the left lower     extremity with ABI 0.84, but it was a very technically difficulty study     due to body habitus, and they were unable to visualize the vessels in the     mid  distal thigh.  3. The patient also underwent transthoracic 2-D echocardiogram which showed     overall right ventricular systolic function lower limits of normal.     Ejection fraction was estimated to be 50 to 55%, mild LVH. There was mild     tricuspid valve regurgitation.  4. Chest x-ray showed mild cardiomegaly.  5. Abdominal ultrasound showed contracted gallbladder without visible     calculi. No other acute abnormalities.   BRIEF ADMISSION HISTORY AND PHYSICAL:  Ms. Garbers is a 55 year old African-  American woman with a past medical history of chronic lower extremity edema  secondary to venous insufficiency plus/minus edema who was brought to the  hospital by her daughter because of  abdominal pain and diarrhea for the past  days.  She reports umbilical pain with minimal nausea.  She also reports  three to four loose bowel movements in the last day.  She also has left  lower extremity pain and more swelling than usual.   HOME MEDICATIONS:  Lasix and Lotensin.   SOCIAL HISTORY:  She never smoked tobacco, never drinks alcohol.  She lives  alone.  She has Medicaid.   PHYSICAL EXAMINATION:  VITAL SIGNS:  Admission temperature 103.5, pulse 125,  respirations 20, O2 saturation 94% on room air, blood pressure 110/53.  RESPIRATORY:  Poor inspiratory effort without wheezes or crackles.  HEART:  Regular without murmurs, rubs, or gallops.  ABDOMEN:  Soft, obese, blood pressure present.  Nontender.  Murphy sign  negative.  McBurney sign negative.  EXTREMITIES:  +4 bilateral edema, left greater than right.  Left lower  extremity shows erythema three-quarters of the leg.  SKIN:  Multiple scattered lesions, discoloration.   LABORATORY DATA:  On admission, sodium 133, potassium 2.9, chloride 102,  bicarbonate 21, BUN 15, creatinine 1.7, glucose 116.  Bilirubin 2.8 with  indirect bilirubin 2.2.  Albumin 2.7, calcium 8, lipase 11.  White blood  cells 29.1, absolute neutrophil count 27.9,  hemoglobin 10.2, hematocrit  29.7, platelets 307.  D-dimer 4.43.   Lower extremity Dopplers were negative.   Urinalysis negative for leukocyte esterase.   HOSPITAL COURSE:  1. ABDOMINAL PAIN:  The patient had nonspecific diffuse abdominal pain.  We     felt this was mild surgical abdomen.  Ultrasound was performed and did     not show any significant pathology.  The pain subsided two days into     admission without further recurrence.   1. DIARRHEA:  The patient had a single episode on March 2004.  Extensive     workup failed to show cause.  We rechecked stool cultures, stool for     white blood cells, CBC, toxin screen which were all essentially negative.     The patient received intravenous hydration.  We tried to perform stool     osmolality test,but by the time we were trying to perform this study, the     diarrhea resolved on its own.  By time of discharge, the patient had no     more bouts of diarrhea.   1. HYPOKALEMIA:  This was probably secondary to diarrhea and diuretics.  The     patient received potassium replacement in hospital. By the time of     discharge, she was normokalemic.   1. CELLULITIS:  The patient had a single episode in 2002.  She was started     on Ancef  IV at time of admission.  Three days into her hospitalization,     the lower extremity physical exam was unchanged.  The patient's white     blood cell count trended down throughout the hospital stay, but the leg     failed to improve.  Three days into admission, we changed antibiotics     from Ancef to Unasyn.  After three more days of Unasyn, the lower     extremity appeared the same.  We thought there was no obvious clinical     improvement.  We consulted Lita Mains, M.D. from infectious disease who     recommended continued Ancef and clindamycin, recommended checking MRI of     lower extremities and Dopplers of the legs.  The Dopplers of the legs  were essentially normal.  The patient did not fit  in the MRI machine. We     continued use of Ancef and clindamycin for the rest of the hospital stay.     At the time of discharge, physical exam of patient's lower extremity was     essentially unchanged since admission, but her white count was absolutely     normal.  She was afebrile throughout the hospital stay.  Blood cultures     were negative.  We discharged patient home on oral antibiotics, and she     will have close followup in the outpatient clinic.   1. ANEMIA:  Probably chronic disease. A serum protein electrophoresis which     was essentially nonspecific.  A urine protein electrophoresis was pending     at the time of discharge.   1. PROTEINURIA:  The patient had 24-hour urine collection.  She does not     have nephrotic syndrome, 900 mg for 24 hours.  Urine protein     electrophoresis was pending for evaluation of this.   1. OBSTRUCTIVE SLEEP APNEA:  The patient stayed on CPAP throughout hospital     stay without any problems.   1. HYPERTENSION:  Stable throughout hospital stay on Lotensin.  We have now     discharged the patient on Lasix.     Edythe Lynn, M.D.                          Evette Doffing, M.D.    SL/MEDQ  D:  07/04/2002  T:  07/04/2002  Job:  [id number]   cc:   C. Milta Deiters, M.D.  [place] [address]  Fax: [phone]   Jacquelynn Cree, M.D.  Int. Med. - Resident - [place], [place] [address]  Fax: [phone]   Doroteo Bradford. Johnnye Sima, M.D.  [place]  [place] [address]  Fax: [phone]   Outpatient Clinic    cc:   C. Milta Deiters, M.D.  [place] [address]  Fax: [phone]   Jacquelynn Cree, M.D.  Int. Med. - Resident - [place], [place] [address]  Fax: [phone]   Doroteo Bradford. Johnnye Sima, M.D.  [place]  [place] [address]  Fax: [facility]

## 2010-05-24 NOTE — Discharge Summary (Signed)
NAMEJENEANNE, [name]                           ACCOUNT NO.:  [id number]   MEDICAL RECORD NO.:  [id number]                   PATIENT TYPE:  INP   LOCATION:  3008                                 FACILITY:  [facility]   PHYSICIAN:  Lucy Chris, MD                  DATE OF BIRTH:  Jul 02, 1955   DATE OF ADMISSION:  03/29/2003  DATE OF DISCHARGE:  04/01/2003                                 DISCHARGE SUMMARY   DISCHARGE DIAGNOSES:  1. Lower extremity cellulitis.  2. Normocytic anemia with a baseline hemoglobin around 10-11.  3. Chronic lower extremity edema.  4. Hypertension.  5. Morbid obesity.  6. Sleep apnea.   DISCHARGE MEDICATIONS:  The patient was discharged to home on:  1. Keflex 500 mg b.i.d.  2. Ferrous sulfate 325 mg t.i.d.  3. Darvocet-N 100 q.4h. p.r.n.  4. The patient was also instructed to continue taking the rest of her     medications as before coming to the hospital.  These include:     a. Lasix 40 mg a day.     b. Lotensin 10 mg a day.   PROCEDURES:  The patient had lower extremity Dopplers performed on March 30, 2003 which were negative for any evidence of DVT.   CONSULTANTS:  None.   HISTORY OF PRESENT ILLNESS:  The patient is a 54 year old woman with history  of chronic lower extremity edema and recurrent cellulitis who presented to  the emergency room with worsening lower extremity edema on the left with  pain and swelling and redness over both legs which had developed over the  previous two weeks.  The patient denies any fever or chills.  She did  describe some shortness of breath but this was not a new problem for her.  She said that the legs had been improving slightly until that last couple of  days.  The patient denied any trauma or recent scrapes.  Note that she had  not been taking her Lasix or Lotensin for about one month prior to  admission.   PHYSICAL EXAMINATION:  VITAL SIGNS:  Temperature 100.7 degrees, pulse 108,  blood pressure 127/78,  respiratory rate was 20, O2 saturation was 92% on  room air.  GENERAL:  The patient was obese in no acute distress.  HEENT:  Eyes were nonicteric, equal, reactive to light.  Extraocular  movements intact.  ENT:  Oral mucosa was moist and clear.  NECK:  Supple without any lymphadenopathy.  LUNGS:  Respirations were distant but clear.  CARDIOVASCULAR:  Tachycardic but regular rhythm.  No appreciable murmurs.  GASTROINTESTINAL:  Obese, soft, nontender.  Positive bowel sounds.  EXTREMITIES:  4+ pitting edema in the left lower extremity, 2-3+ pitting  edema in the right lower extremity, left lower extremity warm with erythema  to the mid calf region.  The patient had about a 1-cm circular area of skin  breakdown on the lateral heel on the left.  MUSCULOSKELETAL:  The patient had symmetric strength.  NEUROLOGIC:  Without any focal deficits, alert and oriented x4.  Cranial  nerves III-XII grossly intact.   LABORATORY DATA:  CMET was with a sodium of 132, potassium of 3.5, chloride  of 100, CO2 of 27, BUN of 8, creatinine 1.1, glucose of 102, bilirubin 1.4,  alkaline phosphatase 130, AST 47, ALT 30, albumin 3.1.  CBC with a white  blood cell count of 18.4, hemoglobin of 9.3, and platelet count of 358,000.  D-dimer was elevated at 2.15.   HOSPITAL COURSE:  #1 - LOWER EXTREMITY EDEMA:  Lower extremity Dopplers were  obtained on March 24 with results as noted above.  The patient was started  on Keflex for presumed cellulitis.  Blood cultures were with one set  positive for coag-negative Staph presumed to be a contaminant, second set  was negative for any growth.  The patient did well on the Keflex with  resolution of her leukocytosis.  WBC count at the time of discharge was 5.6.  The patient did not have any symptoms of systemic infection.  She was  discharged to home on oral Keflex with followup appointment in the interim  at the outpatient clinic within a week after discharge.   #2 - ANEMIA:   The patient has baseline hemoglobin of approximately 10-11.  She is premenopausal and was actively menstruating at the time of admission  and had been for a couple of days.  Hemoglobin at the time of admission was  9.3.  Hemoglobin at the time of discharge was 9.  Hemoccult obtained x1  during menstruation was positive.  Ferritin was normal at 32, percent iron  saturation was low at 6, and TIBC was normal at 391.  Iron was low at 25.  The patient was started on ferrous sulfate which she is to continue as an  outpatient.   DISPOSITION:  The patient was discharged to home in good condition with  followup appointment in the interim at the outpatient clinic for later that  week and she is to follow up.  The patient needs a repeat Hemoccult and H&H  to follow up on her anemia.  Suspect anemia secondary to menstruation.  The  patient also needs a recheck of her lower extremity cellulitis.  Also,  please note that a chest x-ray obtained in the emergency room at the time of  admission revealed some increased cardiomegaly and possible pulmonary venous  hypertension.   DISCHARGE LABORATORY DATA:  CBC with a white blood cell count of 5.6, a  hemoglobin of 9, platelet count of 358,000.  MCV of 85.6.  A BMET with a  sodium of 137, potassium of 3.9, chloride of 102, CO2 of 31, BUN of 10,  creatinine of 1, glucose of 88.                                                Lucy Chris, MD    KK/MEDQ  D:  05/23/2003  T:  05/24/2003  Job:  [id number]   cc:   C. Milta Deiters, M.D.  [place]  [place] [address]  Fax: [phone]

## 2010-05-24 NOTE — Discharge Summary (Signed)
[name], [name]                           ACCOUNT NO.:  [id number]   MEDICAL RECORD NO.:  [id number]                   PATIENT TYPE:  INP   LOCATION:  5531                                 FACILITY:  [facility]   PHYSICIAN:  Alison Murray, M.D.               DATE OF BIRTH:  1955-07-24   DATE OF ADMISSION:  03/23/2002  DATE OF DISCHARGE:  03/28/2002                                 DISCHARGE SUMMARY   DISCHARGE DIAGNOSES:  1. Dehydration secondary to diarrhea.  2. Acute renal failure secondary to dehydration, prerenal.  3. Lower extremity edema.  4. Hypertension.  5. Sleep apnea.   DISCHARGE MEDICATIONS:  1. Lasix 40 mg 1 p.o. daily.  2. Lotensin 10 mg 1 p.o. daily to be started as outpatient when blood     pressure comes up.   FOLLOW UP:  She is to follow up at clinic at [phone] for appointment which  could not be made in hospital in about one to two weeks.   PROCEDURES:  1. Chest x-ray showed area with  increasing bibasilar atelectasis and     cardiomegaly.  2. Renal ultrasound showed nonspecific enlargement of both kidneys with     increased echogenicity which chronically can be seen with various causes     of acute medical renal disease including necrosis.  Nephritis can have     this appearance also, but clinically, there is no hydronephrosis.   CONSULTATIONS:  None.   HISTORY OF PRESENT ILLNESS:  The patient is a 55 year old African-American  female with past medical history significant for hypertension, chronic lower  extremity edema, orthopnea, morbid obesity, who came in with diarrhea for  six days.  Diarrhea started after she had eaten some chicken made by her  boyfriend.  She had difficulty bowel movements for a day, no blood or mucus  in stool, watery, history of abdominal cramping all over, diffuse.  No  history of vomiting but nausea was present.  History of fever of 101 in the  ER.  Before, the patient did not know temperature.  No history of any sick  contacts, travel outside of the country, antibiotic use in recent past.  No  history of any chest pain or shortness of breath.  Complains of lower  extremity edema which is a chronic problem presently on Lasix.  History of  NSAID use of Advair of about 4 a day and Motrin every day.   ALLERGIES:  No known drug allergies.   PAST MEDICAL HISTORY:  As stated above.   SOCIAL HISTORY:  She is not a smoker and does not drink alcohol, no cocaine,  no IV drugs.  She is single.  She is disabled.  She has Medicaid.  She lives  with a sister and two children.   FAMILY HISTORY:  Mother has chronic renal insufficiency with diabetes.  Father has coronary artery disease and diabetes.  PHYSICAL EXAMINATION:  VITAL SIGNS:  Pulse 113, blood pressure 117/83  sitting, lying down is 122.  Pulse 116.  Temperature 101.8, respiratory rate  28, O2 saturation 97% on room air.  GENERAL:  Pleasant, obese, in little distress.  HEENT:  Pupils are equal, round, and reactive to light.  Extraocular  movements intact.  No pallor or jaundice.  TMs pearly white.  Nose has  hypertrophic turbinates.  NECK:  No JVD, no edema.  LUNGS:  Respiratory clear with air entry, scattered wheezes.  CARDIOVASCULAR:  Tachycardic.  No murmurs, rubs, or gallops.  ABDOMEN: Soft, obese, tenderness is diffuse, increased in left lower  quadrant, no rebound tenderness.  EXTREMITIES:  Edema, 2+ bilaterally.  Left leg edema worse compared to  right.  RECTAL:  Guaiac negative.  SKIN:  Chronic skin changes all over body.  No lymphadenopathy.  NEUROLOGIC:  Function intact.  Cranial nerves II-XII intact.  Reflexes 2+  bilaterally.  Motor 5/5 strength.  Decreased monofilament right foot.  PSYCHIATRIC:  Appropriate.   LABORATORY DATA:  Sodium 134, potassium 3.3, chloride 104, bicarb 21, BUN  46, creatinine 3.4, serum glucose 116.  WBC 9.4, hemoglobin 11.1, platelets  432, MCV 100.6, RDW 13.9, percent neutrophils 57.  Urine had  hemoglobin   large, bilirubin small, ketones present.  Total protein 300,  urobilinogen  1.0,  nitrite negative.  Leukocyte esterase negative, epithelial cells,  white blood cells 0 to 6, few bacteria.[name]   HOSPITAL COURSE:  1. DIARRHEA AND DEHYDRATION:  When patient came in, she complained of severe     diarrhea.  Stool studies were ordered but were not sent secondary to     patient not having diarrhea when she came to the hospital.  She gave her     IV fluid rehydration.   1. ACUTE RENAL FAILURE:  Creatinine 0.8.  Her creatinine had come up from     0.8 to about 3.4 on admission.  Since rehydration, creatinine did come     down to 1.6 on the day of discharge.  Increased creatinine was thought to     be secondary to dehydration, and also we did other tests which were     negative also. Hemoglobin in urine less than 1, urine sodium 22.     Therefore, her increased creatinine was thought to be secondary to her     use of NSAIDs which has also been improving.  On the day of discharge,     her creatinine had come back to 1.6 which is normal.   1. ANEMIA:  The patient had a hemoglobin which dropped from 11.4 on day of     admission to 9.7 and then further dropped to about 8.6.  Iron studies     were done.  She was guaiac negative as well as iron was 75, total iron     binding capacity 231, percent saturation 32, B12 was 614, folate 12.5,     ferritin 412.  This definitely ruled out iron deficiency anemia as well     as B12 deficiency.  The anemia may be secondary to a  normocytic anemia,     in fact,  cause not known.  She definitely ceased menstruating.  She has     continued to menstruate without iron deficiency anemia.  This probably     has to be worked up as an outpatient with possible bone marrow biopsy.   1. HYPERTENSION:  Blood pressure remained pretty stable throughout her stay.  Blood pressure on day of discharge was 115/83.  This issue of blood    pressure has to be resolved as an  outpatient as right now her blood     pressure is under control, and any further weaning of Lasix or Lotensin     will drop her further.    DISCHARGE LABORATORY DATA:  Sodium 137, potassium 4.1, chloride 112, bicarb  19, BUN 15, creatinine 1.6, glucose 86.  WBC 6.4, hemoglobin 9.9, platelets  520.     Debbora Lacrosse, M.D.                         Alison Murray, M.D.    TS/MEDQ  D:  03/30/2002  T:  03/31/2002  Job:  [id number]   cc:   C. Milta Deiters, M.D.  [place]  [place] [address]  Fax: [phone]

## 2010-05-25 ENCOUNTER — Inpatient Hospital Stay (HOSPITAL_COMMUNITY): Payer: Medicaid Other

## 2010-05-25 ENCOUNTER — Encounter (HOSPITAL_COMMUNITY): Payer: Self-pay | Admitting: Radiology

## 2010-05-25 DIAGNOSIS — I5020 Unspecified systolic (congestive) heart failure: Secondary | ICD-10-CM

## 2010-05-25 DIAGNOSIS — A419 Sepsis, unspecified organism: Secondary | ICD-10-CM

## 2010-05-25 DIAGNOSIS — R6521 Severe sepsis with septic shock: Secondary | ICD-10-CM

## 2010-05-25 DIAGNOSIS — R6520 Severe sepsis without septic shock: Secondary | ICD-10-CM

## 2010-05-25 LAB — CARDIAC PANEL(CRET KIN+CKTOT+MB+TROPI)
CK, MB: 3.8 ng/mL (ref 0.3–4.0)
Relative Index: 1.1 (ref 0.0–2.5)
Total CK: 354 U/L — ABNORMAL HIGH (ref 7–177)
Troponin I: <0.3 ng/mL (ref <0.30)

## 2010-05-25 LAB — DIFFERENTIAL
Basophils Absolute: 0 10*3/uL (ref 0.0–0.1)
Basophils Relative: 0 % (ref 0–1)
Eosinophils Absolute: 0 10*3/uL (ref 0.0–0.7)
Eosinophils Relative: 0 % (ref 0–5)
Lymphocytes Relative: 15 % (ref 12–46)
Lymphs Abs: 0.9 10*3/uL (ref 0.7–4.0)
Monocytes Absolute: 0.4 10*3/uL (ref 0.1–1.0)
Monocytes Relative: 7 % (ref 3–12)
Neutro Abs: 4.7 10*3/uL (ref 1.7–7.7)
Neutrophils Relative %: 78 % — ABNORMAL HIGH (ref 43–77)

## 2010-05-25 LAB — PROTIME-INR
INR: 1.26 (ref 0.00–1.49)
Prothrombin Time: 16 seconds — ABNORMAL HIGH (ref 11.6–15.2)

## 2010-05-25 LAB — BASIC METABOLIC PANEL
BUN: 45 mg/dL — ABNORMAL HIGH (ref 6–23)
CO2: 20 mEq/L (ref 19–32)
Calcium: 7.5 mg/dL — ABNORMAL LOW (ref 8.4–10.5)
Chloride: 103 mEq/L (ref 96–112)
Creatinine, Ser: 8.03 mg/dL — ABNORMAL HIGH (ref 0.4–1.2)
GFR calc Af Amer: 6 mL/min — ABNORMAL LOW (ref >60)
GFR calc non Af Amer: 5 mL/min — ABNORMAL LOW (ref >60)
Glucose, Bld: 99 mg/dL (ref 70–99)
Potassium: 3.8 mEq/L (ref 3.5–5.1)
Sodium: 137 mEq/L (ref 135–145)

## 2010-05-25 LAB — CBC
HCT: 36.2 % (ref 36.0–46.0)
Hemoglobin: 11.3 g/dL — ABNORMAL LOW (ref 12.0–15.0)
MCH: 33 pg (ref 26.0–34.0)
MCHC: 31.2 g/dL (ref 30.0–36.0)
MCV: 105.8 fL — ABNORMAL HIGH (ref 78.0–100.0)
Platelets: 99 10*3/uL — ABNORMAL LOW (ref 150–400)
RBC: 3.42 MIL/uL — ABNORMAL LOW (ref 3.87–5.11)
RDW: 16.6 % — ABNORMAL HIGH (ref 11.5–15.5)
WBC: 6 10*3/uL (ref 4.0–10.5)

## 2010-05-25 LAB — POCT I-STAT 3, ART BLOOD GAS (G3+)
Acid-base deficit: 3 mmol/L — ABNORMAL HIGH (ref 0.0–2.0)
Bicarbonate: 23.1 mEq/L (ref 20.0–24.0)
O2 Saturation: 97 %
Patient temperature: 101
TCO2: 24 mmol/L (ref 0–100)
pCO2 arterial: 47.1 mmHg — ABNORMAL HIGH (ref 35.0–45.0)
pH, Arterial: 7.305 — ABNORMAL LOW (ref 7.350–7.400)
pO2, Arterial: 104 mmHg — ABNORMAL HIGH (ref 80.0–100.0)

## 2010-05-25 LAB — TYPE AND SCREEN
ABO/RH(D): O POS
Antibody Screen: NEGATIVE

## 2010-05-25 LAB — CARBOXYHEMOGLOBIN
Carboxyhemoglobin: 2 % — ABNORMAL HIGH (ref 0.5–1.5)
Methemoglobin: 0.8 % (ref 0.0–1.5)
O2 Saturation: 86.4 %
Total hemoglobin: 11.3 g/dL — ABNORMAL LOW (ref 12.5–16.0)

## 2010-05-25 LAB — CLOSTRIDIUM DIFFICILE BY PCR

## 2010-05-25 LAB — APTT: aPTT: 39 seconds — ABNORMAL HIGH (ref 24–37)

## 2010-05-25 LAB — MRSA PCR SCREENING: MRSA by PCR: NEGATIVE

## 2010-05-25 LAB — CORTISOL: Cortisol, Plasma: 23.3 ug/dL

## 2010-05-25 LAB — D-DIMER, QUANTITATIVE (NOT AT ARMC): D-Dimer, Quant: 6.85 ug/mL-FEU — ABNORMAL HIGH (ref 0.00–0.48)

## 2010-05-25 LAB — GLUCOSE, CAPILLARY: Glucose-Capillary: 97 mg/dL (ref 70–99)

## 2010-05-25 MED ORDER — IOHEXOL 300 MG/ML  SOLN
100.0000 mL | Freq: Once | INTRAMUSCULAR | Status: AC | PRN
  Administered 2010-05-25: 100 mL via INTRAVENOUS

## 2010-05-26 ENCOUNTER — Inpatient Hospital Stay (HOSPITAL_COMMUNITY): Payer: Medicaid Other

## 2010-05-26 DIAGNOSIS — R7881 Bacteremia: Secondary | ICD-10-CM

## 2010-05-26 DIAGNOSIS — A419 Sepsis, unspecified organism: Secondary | ICD-10-CM

## 2010-05-26 DIAGNOSIS — R6521 Severe sepsis with septic shock: Secondary | ICD-10-CM

## 2010-05-26 DIAGNOSIS — A0472 Enterocolitis due to Clostridium difficile, not specified as recurrent: Secondary | ICD-10-CM

## 2010-05-26 LAB — COMPREHENSIVE METABOLIC PANEL
ALT: 19 U/L (ref 0–35)
AST: 52 U/L — ABNORMAL HIGH (ref 0–37)
Albumin: 1.9 g/dL — ABNORMAL LOW (ref 3.5–5.2)
Alkaline Phosphatase: 71 U/L (ref 39–117)
BUN: 27 mg/dL — ABNORMAL HIGH (ref 6–23)
CO2: 25 mEq/L (ref 19–32)
Calcium: 7.7 mg/dL — ABNORMAL LOW (ref 8.4–10.5)
Chloride: 97 mEq/L (ref 96–112)
Creatinine, Ser: 4.66 mg/dL — ABNORMAL HIGH (ref 0.4–1.2)
GFR calc Af Amer: 12 mL/min — ABNORMAL LOW (ref >60)
GFR calc non Af Amer: 10 mL/min — ABNORMAL LOW (ref >60)
Glucose, Bld: 78 mg/dL (ref 70–99)
Potassium: 4.5 mEq/L (ref 3.5–5.1)
Sodium: 132 mEq/L — ABNORMAL LOW (ref 135–145)
Total Bilirubin: 1.4 mg/dL — ABNORMAL HIGH (ref 0.3–1.2)
Total Protein: 7.8 g/dL (ref 6.0–8.3)

## 2010-05-26 LAB — CBC
HCT: 35.2 % — ABNORMAL LOW (ref 36.0–46.0)
Hemoglobin: 11.5 g/dL — ABNORMAL LOW (ref 12.0–15.0)
MCH: 33.7 pg (ref 26.0–34.0)
MCHC: 32.7 g/dL (ref 30.0–36.0)
MCV: 103.2 fL — ABNORMAL HIGH (ref 78.0–100.0)
Platelets: 99 10*3/uL — ABNORMAL LOW (ref 150–400)
RBC: 3.41 MIL/uL — ABNORMAL LOW (ref 3.87–5.11)
RDW: 16.4 % — ABNORMAL HIGH (ref 11.5–15.5)
WBC: 15.8 10*3/uL — ABNORMAL HIGH (ref 4.0–10.5)

## 2010-05-26 LAB — PROCALCITONIN: Procalcitonin: >175 ng/mL

## 2010-05-26 LAB — LACTIC ACID, PLASMA: Lactic Acid, Venous: 2 mmol/L (ref 0.5–2.2)

## 2010-05-26 NOTE — Consult Note (Signed)
[name], [name]                 ACCOUNT NO.:  [id number]  MEDICAL RECORD NO.:  [id number]           PATIENT TYPE:  I  LOCATION:  2115                         FACILITY:  [facility]  PHYSICIAN:  Sol Blazing, M.D.DATE OF BIRTH:  [birth year]  DATE OF CONSULTATION:  05/25/2010 DATE OF DISCHARGE:                                CONSULTATION   REASON FOR CONSULTATION:  Need for dialysis and related services in a  patient with ESRD, HIV and C. diff who presents with abdominal pain and left thigh and groin pain.  She also had a fever and low blood pressure.  She is admitted to ICU.  The patient has history of HIV/AIDS, has been poorly compliant with ART and a most recent CD-4 was 90.  She also has end-stage renal disease on dialysis via right upper arm AV graft.  History of recurrent cellulitis as follows:   April 2011, abdominal wall cellulitis, Gram-  positive bacteremia treated with doxycycline.   June 2011, right thigh cellulitis with pain and erythema responded to  vancomycin, treated for possible MRSA colonization.   July 2011, right upper extremity AV graft infection/bleed with MSSA  bacteremia treated with AV graft, resection of degenerated segment and  revision and antibiotics.   November 2011, right lower extremity cellulitis with a group B strep  bacteremia.   February 2012, left leg cellulitis with MSSA bacteremia.  The patient also was admitted in December 2011, with anasarca, pericardial effusion, and cardiomyopathy (EF 20-25%).  She also carries a diagnosis of MGUS.  She has had a bypass graft to the right leg.  Most recently, was in the hospital with C. diff colitis and VRE UTI in March 2012.  Her recent history is that the patient went to dialysis on Thursday.   Her AV fistula infiltrated and dialysis could not be completed.  She states that she was brought back the next day, but the graft was too swollen and then she became ill and came to the hospital.  She  developed crampy lower abdominal pain, pain in her left leg and groin, and chills.  She denied any nausea, vomiting, or diarrhea.  Did not take her temperature.  Denies any headache, sore throat, difficulty swallowing, productive cough, chest pain, shortness of breath.  She does not make urine.  She denies any vaginal discharge or bleeding.  She denies any joint pain or swelling. She has chronic left leg edema and discoloration which is not new.  Her temperature on admission in the emergency room was 103.1 and her blood pressure was 82/30.  PAST MEDICAL HISTORY: 1. ESRD, dry weight 71 kg 4 hours dialysis Tuesday, Thursday, Saturday     at [facility]. 2. History of recurrent cellulitis with associated bacteremia episodes     which have included MSSA, group B strep primarily. 3. HIV, noncompliant with ART. 4. MGUS. 5. Anemia due to chronic kidney disease. 6. Cardiomyopathy diagnosed in July 2011. 7. Right lower extremity bypass graft. 8. Malnutrition.  HOME MEDICATIONS: 1. Bactrim DS 1 tablet daily. 2. Stavudine, ritonavir, lamivudine, atazanavir for HIV. 3. Pepcid. 4. Nephro-Vite.  5. Aspirin. 6. EPO 28,000 units with dialysis. 7. Zemplar 6 mcg with dialysis. 8. Venofer 50 mg weekly on Thursday.  HOSPITAL MEDICATIONS: 1. Her HIV meds. 2. Zyvox IV. 3. Levophed drip. 4. Protonix IV. 5. Zosyn IV. 6. P.o. vancomycin 500 mg every 6 hours.  SOCIAL HISTORY:  Lives with her daughter.  She is never married.  She cares for herself, now ambulates without difficulty, cooks on her non- dialysis days.  She does not drive.  She does not use alcohol, drugs, or tobacco.  FAMILY HISTORY:  Noncontributory.  REVIEW OF SYSTEMS:  As above.  PHYSICAL EXAMINATION:  VITAL SIGNS:  Blood pressure is 96/50, CVP is 12; heart rate is 96, sinus rhythm; temperature 100.3, O2 sat 99% on 3 L nasal cannula. GENERAL:  The patient is awake and alert, not in any distress.  She appears  weak.  She appears flushed as well. SKIN:  Warm and flushed in the face. HEENT:  PERRL, EOMI.  Throat is clear and moist. NECK:  Supple with no JVD, no lymphadenopathy. CHEST:  Some faint bibasilar fine rales, but otherwise clear.  No rhonchi, no wheezing.  No bronchial breath sounds. CARDIAC:  2/6 systolic ejection murmur, left upper sternal border.  No rub or gallop. ABDOMEN:  Nondistended, nontender, soft.  Active bowel sounds.  No masses.  No organomegaly. RECTAL:  Deferred.  The perineum was examined in detail without any evidence of an abscess or fluctuance.  There is erythema in the left groin area which is slightly tender, but the erythema is not really remarkable in extent.  There are some chronic skin changes in both groins and a malodor is present in intertriginous areas.  There is no vulvar swelling or erythema. EXTREMITIES:  No joint effusion or deformity.  There is a chronic brawny skin changes in the left leg below the knee with blackish discoloration of the skin and 1+ nonpitting edema on the left.  No edema on the right. Pulses on the right side are 2+, +1 pulse in the left foot.  The feet are warm, well perfused.  No ischemic changes of ulcer or gangrene in the feet.  The right upper arm graft has a strong bruit and thrill. NEUROLOGIC:  Alert and oriented x3.  No focal deficits.  LABORATORY DATA:  White blood count 6000, hemoglobin 11, MCV 106, platelets 99.  Sodium 137, potassium 3.8, BUN 45, creatinine 8.0, calcium 7, INR 1.2.  Procalcitonin 37, lactic acid 2.3, lipase 41. Cardiac enzymes, isoenzymes are negative.  Blood cultures pending.  Chest x-ray from May 24, 2010, shows cardiomegaly, no infiltrates, nonspecific bowel gas pattern on the abdominal film and a CAT scan of the abdomen was done, but results are pending.  IMPRESSION: 1. End-stage renal disease, due for dialysis today.  She missed her     last dialysis due to an infiltrated right upper arm access.   The     dialysis access is patent and will use this today for routine     dialysis with no fluid off.  There is no volume overload.  She looks     euvolemic. 2. Fever with lower abdominal and left groin pain.  With the patient's     history of recurrent cellulitis, this may be another episode of     skin infection.  She has had frequent episodes of cellulitis associated     with bacteremia, likely related to progressive immunodeficiency from HIV.     Blood cultures are pending and  she is on     broad-spectrum antibiotics and vasopressor support. 3. History of human immunodeficiency virus, poorly compliant with     ART. 4. History of monoclonal gammopathy of undetermined significance. 5. History of anemia on erythropoietin with dialysis. 6. Secondary hyperparathyroidism and vitamin D. 7. Recurring lower extremity and abdominal wall cellulitis. 8. History of methicillin-susceptible Staphylococcus aureus     arteriovenous graft infection in July 2011, with staphylococcal     bacteremia. 9. Cardiomyopathy. 10.Recent vancomycin-resistant enterococci urinary tract infection. 11.Recent Clostridium difficile colitis.  RECOMMENDATIONS:  We will dialyze today.  EPO and vitamin D and IV iron per orders have been ordered.  I and O cath for UA and culture.     Sol Blazing, M.D.     RDS/MEDQ  D:  05/25/2010  T:  05/25/2010  Job:  [id number]  Electronically Signed by Roney Jaffe M.D. on 05/26/2010 03:37:47 PM

## 2010-05-27 ENCOUNTER — Inpatient Hospital Stay (HOSPITAL_COMMUNITY): Payer: Medicaid Other

## 2010-05-27 DIAGNOSIS — B20 Human immunodeficiency virus [HIV] disease: Secondary | ICD-10-CM

## 2010-05-27 DIAGNOSIS — A419 Sepsis, unspecified organism: Secondary | ICD-10-CM

## 2010-05-27 DIAGNOSIS — I319 Disease of pericardium, unspecified: Secondary | ICD-10-CM

## 2010-05-27 LAB — CBC
HCT: 36.8 % (ref 36.0–46.0)
Hemoglobin: 12 g/dL (ref 12.0–15.0)
MCH: 33.2 pg (ref 26.0–34.0)
MCHC: 32.6 g/dL (ref 30.0–36.0)
MCV: 101.9 fL — ABNORMAL HIGH (ref 78.0–100.0)
Platelets: 121 10*3/uL — ABNORMAL LOW (ref 150–400)
RBC: 3.61 MIL/uL — ABNORMAL LOW (ref 3.87–5.11)
RDW: 16.6 % — ABNORMAL HIGH (ref 11.5–15.5)
WBC: 17.6 10*3/uL — ABNORMAL HIGH (ref 4.0–10.5)

## 2010-05-27 LAB — CULTURE, BLOOD (ROUTINE X 2)
Culture  Setup Time: 201205190126
Culture  Setup Time: 201205190126

## 2010-05-27 LAB — COMPREHENSIVE METABOLIC PANEL
ALT: 17 U/L (ref 0–35)
AST: 34 U/L (ref 0–37)
Albumin: 1.8 g/dL — ABNORMAL LOW (ref 3.5–5.2)
Alkaline Phosphatase: 66 U/L (ref 39–117)
BUN: 49 mg/dL — ABNORMAL HIGH (ref 6–23)
CO2: 22 mEq/L (ref 19–32)
Calcium: 8.4 mg/dL (ref 8.4–10.5)
Chloride: 93 mEq/L — ABNORMAL LOW (ref 96–112)
Creatinine, Ser: 5.69 mg/dL — ABNORMAL HIGH (ref 0.4–1.2)
GFR calc Af Amer: 9 mL/min — ABNORMAL LOW (ref >60)
GFR calc non Af Amer: 8 mL/min — ABNORMAL LOW (ref >60)
Glucose, Bld: 90 mg/dL (ref 70–99)
Potassium: 4.7 mEq/L (ref 3.5–5.1)
Sodium: 128 mEq/L — ABNORMAL LOW (ref 135–145)
Total Bilirubin: 1.9 mg/dL — ABNORMAL HIGH (ref 0.3–1.2)
Total Protein: 7.7 g/dL (ref 6.0–8.3)

## 2010-05-27 LAB — PROCALCITONIN: Procalcitonin: >175 ng/mL

## 2010-05-28 ENCOUNTER — Inpatient Hospital Stay (HOSPITAL_COMMUNITY): Payer: Medicaid Other

## 2010-05-28 DIAGNOSIS — A0472 Enterocolitis due to Clostridium difficile, not specified as recurrent: Secondary | ICD-10-CM

## 2010-05-28 DIAGNOSIS — R7881 Bacteremia: Secondary | ICD-10-CM

## 2010-05-28 DIAGNOSIS — A419 Sepsis, unspecified organism: Secondary | ICD-10-CM

## 2010-05-28 DIAGNOSIS — R6521 Severe sepsis with septic shock: Secondary | ICD-10-CM

## 2010-05-28 LAB — CBC
HCT: 36.7 % (ref 36.0–46.0)
Hemoglobin: 12.3 g/dL (ref 12.0–15.0)
MCH: 33.7 pg (ref 26.0–34.0)
MCHC: 33.5 g/dL (ref 30.0–36.0)
MCV: 100.5 fL — ABNORMAL HIGH (ref 78.0–100.0)
Platelets: 120 10*3/uL — ABNORMAL LOW (ref 150–400)
RBC: 3.65 MIL/uL — ABNORMAL LOW (ref 3.87–5.11)
RDW: 16.4 % — ABNORMAL HIGH (ref 11.5–15.5)
WBC: 13.8 10*3/uL — ABNORMAL HIGH (ref 4.0–10.5)

## 2010-05-28 LAB — PHOSPHORUS: Phosphorus: 6.2 mg/dL — ABNORMAL HIGH (ref 2.3–4.6)

## 2010-05-28 LAB — BASIC METABOLIC PANEL
BUN: 64 mg/dL — ABNORMAL HIGH (ref 6–23)
CO2: 21 mEq/L (ref 19–32)
Calcium: 8.1 mg/dL — ABNORMAL LOW (ref 8.4–10.5)
Chloride: 93 mEq/L — ABNORMAL LOW (ref 96–112)
Creatinine, Ser: 6.04 mg/dL — ABNORMAL HIGH (ref 0.4–1.2)
GFR calc Af Amer: 9 mL/min — ABNORMAL LOW (ref >60)
GFR calc non Af Amer: 7 mL/min — ABNORMAL LOW (ref >60)
Glucose, Bld: 125 mg/dL — ABNORMAL HIGH (ref 70–99)
Potassium: 4.2 mEq/L (ref 3.5–5.1)
Sodium: 128 mEq/L — ABNORMAL LOW (ref 135–145)

## 2010-05-28 LAB — HEPATITIS B SURFACE ANTIGEN: Hepatitis B Surface Ag: NEGATIVE

## 2010-05-28 LAB — GLUCOSE, CAPILLARY: Glucose-Capillary: 151 mg/dL — ABNORMAL HIGH (ref 70–99)

## 2010-05-28 LAB — MAGNESIUM: Magnesium: 2.1 mg/dL (ref 1.5–2.5)

## 2010-05-28 NOTE — Consult Note (Addendum)
[name], [name]                 ACCOUNT NO.:  [id number]  MEDICAL RECORD NO.:  [id number]           PATIENT TYPE:  I  LOCATION:  2115                         FACILITY:  [facility]  PHYSICIAN:  [name] Lanius, MD  DATE OF BIRTH:  1955/05/01  DATE OF CONSULTATION: DATE OF DISCHARGE:                                CONSULTATION   REASON FOR CONSULTATION:  Bacteremia.  HISTORY OF PRESENT ILLNESS:  This is a 55 year old female with a history of HIV/AIDS and last CD-4 count of 90 and poorly compliant in the past. Most recently on a regimen of boosted Reyataz with stavudine and lamivudine, and also with the history of MSSA bacteremia who came in for evaluation, noted to have fever and low blood pressure.  She was admitted to the ICU for concern for sepsis syndrome particularly with history of staph aureus bacteremia in the past.  The patient also has had group B strep bacteremia in the past with one culture notable in November 2011, and her staph aureus has been sensitive, not MRSA, but MSSA.  At this time, the patient does not have any specific complaints.  In fact, she says she feels well and does not endorse any chills or notice any fever.  She denies any dysuria, pyuria, chest pain, back pain, or headache.  PAST MEDICAL HISTORY: 1. HIV/AIDS with poorly compliant. 2. Recurrent Methicillin-sensitive staph aureus bacteremia. 3. History of group B strep cellulitis. 4. HIV associated nephropathy requiring hemodialysis. 5. Hypertension. 6. Pancytopenia. 7. Cardiomyopathy with an ejection fraction of 20-30%. 8. MGUS. 9. Anemia of chronic disease. 10.Right lower extremity bypass graft. 11.Clostridium difficile infection currently.  MEDICATIONS: 1. Zosyn renally dosed every 8 hours IV. 2. Vancomycin 500 mg p.o. q.6 hours. 3. Atazanavir 300 mg daily with ritonavir 100 mg daily. 4. Lamivudine 25 mg daily. 5. Stavudine 20 mg daily. 6. Bactrim 1 tablet double strength daily. 7.  Hydrocortisone 50 mg IV every 6 hours. 8. Flagyl 500 mg p.o. every 8 hours. 9. Aspirin 81 mg daily. 10.Probiotics twice a day.  ALLERGIES:  No known drug allergies.  SOCIAL HISTORY:  The patient denies any drug use.  Denies tobacco or any alcohol use.  She does live with her daughters who do not know about her HIV status.  FAMILY HISTORY:  There is significant history of diabetes in her cousin and alcoholism.  REVIEW OF SYSTEMS:  A 12-point review of systems was obtained, has been negative except as per the history of present illness.  PHYSICAL EXAMINATION:  VITAL SIGNS:  Temperature is 98.8 and she has remained afebrile during this hospitalization, BP is 96/61, and heart rate is 60.  GENERAL:  The patient is awake, alert, and oriented x3 and appears in no acute distress at this time. HEENT:  No thrush, no lymphadenopathy. CARDIOVASCULAR:  Regular rate and rhythm.  No murmurs, rubs, or gallops appreciated. LUNGS:  Clear to auscultation bilaterally. ABDOMEN:  Soft, nontender, nondistended with positive bowel sounds and no hepatosplenomegaly. EXTREMITIES:  No sign of cellulitis and no edema. SKIN:  No rashes.  LABORATORY DATA:  Blood cultures x2 with group B strep.  WBC is 17.6, hemoglobin of 12, and platelets 121.  AST is 34, ALT is 17.  Clostridium difficile is positive.  ASSESSMENT:  A 55 year old female with group B strep bacteremia and Clostridium difficile diarrhea.  PLAN: 1. Group B strep bacteremia.  The patient has had an echocardiogram     done.  There is a concern for endocarditis with group B strep as     there is with staph aureus.  We will await the results of that.     Nonetheless, with her dialysis catheters.  The patient will likely     need prolonged course of ampicillin as this is a recurrent     infection for her. 2. Clostridium difficile infection.  The patient is on p.o. vancomycin     and agree to continue with this. 3. HIV/AIDS.  The patient is on  what appears to be her home regimen.     I doubt that she has been compliant.  I will have the CD-4 and     viral load checked.  She is on PCP prophylaxis with Bactrim.  No     indications for MAC prophylaxis at this time.  I did stress     compliance and the patient does report that she is complaint, but     her history does not suggest that.     Thayer Headings, MD   ______________________________ [name] Lanius, MD    RWC/MEDQ  D:  05/27/2010  T:  05/28/2010  Job:  [id number]  Electronically Signed by Scharlene Gloss MD on 05/28/2010 09:20:39 AM Electronically Signed by Jennet Maduro MD on 06/11/2010 01:36:04 PM

## 2010-05-29 DIAGNOSIS — B20 Human immunodeficiency virus [HIV] disease: Secondary | ICD-10-CM

## 2010-05-29 DIAGNOSIS — A419 Sepsis, unspecified organism: Secondary | ICD-10-CM

## 2010-05-29 LAB — BASIC METABOLIC PANEL
BUN: 38 mg/dL — ABNORMAL HIGH (ref 6–23)
CO2: 27 mEq/L (ref 19–32)
Calcium: 8.3 mg/dL — ABNORMAL LOW (ref 8.4–10.5)
Chloride: 98 mEq/L (ref 96–112)
Creatinine, Ser: 4.27 mg/dL — ABNORMAL HIGH (ref 0.4–1.2)
GFR calc Af Amer: 13 mL/min — ABNORMAL LOW (ref >60)
GFR calc non Af Amer: 11 mL/min — ABNORMAL LOW (ref >60)
Glucose, Bld: 98 mg/dL (ref 70–99)
Potassium: 3.4 mEq/L — ABNORMAL LOW (ref 3.5–5.1)
Sodium: 134 mEq/L — ABNORMAL LOW (ref 135–145)

## 2010-05-29 LAB — CBC
HCT: 34.9 % — ABNORMAL LOW (ref 36.0–46.0)
Hemoglobin: 11.2 g/dL — ABNORMAL LOW (ref 12.0–15.0)
MCH: 32.6 pg (ref 26.0–34.0)
MCHC: 32.1 g/dL (ref 30.0–36.0)
MCV: 101.5 fL — ABNORMAL HIGH (ref 78.0–100.0)
Platelets: 124 10*3/uL — ABNORMAL LOW (ref 150–400)
RBC: 3.44 MIL/uL — ABNORMAL LOW (ref 3.87–5.11)
RDW: 16.4 % — ABNORMAL HIGH (ref 11.5–15.5)
WBC: 7.7 10*3/uL (ref 4.0–10.5)

## 2010-05-29 LAB — MAGNESIUM: Magnesium: 2.1 mg/dL (ref 1.5–2.5)

## 2010-05-29 LAB — HIV-1 RNA QUANT-NO REFLEX-BLD
HIV 1 RNA Quant: 160000 copies/mL — ABNORMAL HIGH (ref <20)
HIV-1 RNA Quant, Log: 5.2 {Log} — ABNORMAL HIGH (ref <1.30)

## 2010-05-29 LAB — PHOSPHORUS: Phosphorus: 4.6 mg/dL (ref 2.3–4.6)

## 2010-05-29 NOTE — Discharge Summary (Signed)
[name], [name]                 ACCOUNT NO.:  [id number]  MEDICAL RECORD NO.:  [id number]           PATIENT TYPE:  I  LOCATION:  [id number]                         FACILITY:  [facility]  PHYSICIAN:  Acquanetta Chain, D.O.DATE OF BIRTH:  1955/01/08  DATE OF ADMISSION:  03/28/2010 DATE OF DISCHARGE:  04/04/2010                              DISCHARGE SUMMARY   DISCHARGE DIAGNOSES: 1. Clostridium difficile colitis. 2. Enterococcus urinary tract infection resistant to vancomycin. 3. Left leg pain after mechanical fall. 4. End-stage renal disease. 5. History of left leg cellulitis. 6. Human immunodeficiency virus. 7. MGUS. 8. Anemia. 9. History of bacteremia due to methicillin-susceptible Staphylococcus     aureus.  DISCHARGE MEDICATIONS: 1. Reyataz 150 mg capsules, take 2 tablets by mouth daily. 2. Darbepoetin 200 mcg/0.4 mL injection, 200 mg injected at dialysis     on Saturday. 3. Lamivudine 10 mg/mL solution 25 mg by mouth daily. 4. Pepcid 20 mg tabs take 1 tablet by mouth daily. 5. Zemplar 5 mcg/mL injection, 6 mcg intravenous TTS-12 N. 6. Ritonavir 100 mg tabs 1 tablet by mouth daily with meals. 7. Stavudine 20 mg capsules take 1 tablet by mouth daily. 8. Bactrim 160-800 mg tabs take 1 tablet by mouth daily. 9. Vancomycin 50 mg/mL solution 500 mg by mouth every 6 hours, take     for 10 days. 10.Tylenol 650 mg tabs take 1 tablet by mouth every 4 hours as needed     for pain. 11.Aspirin 81 mg tabs take 1 tablet by mouth every morning. 12.Ensure OTC 237 mL by mouth twice daily with meals. 13.Nephro-Vite 1 mg tabs take 1 tablet by mouth daily. 14.Vitamin B complex with C 1 tablet by mouth daily.  DISPOSITION AND FOLLOWUP: 1. The patient is scheduled to follow up with Dr. Manuella Ghazi in [facility] on March 9 at 1:15 p.m.  At that time, the     patient's diarrhea should be followed up to ensure resolution in     her C. diff colitis.  It should also be ensured  that the patient     completed her entire 14 day course of this antibiotic.  A CBC     should be checked to continue to follow the patient's hemoglobin as     she does have a chronic anemia.  I also recommend continued     following of her white blood cell counts given that she was     neutropenic on admission.  If the patient's white blood cell count     continues to decline, she does respond quite well to Neupogen and     this should be reinstated. 2. The patient should be screened for symptoms of urinary tract     infection as urine culture did demonstrate greater than 100,000     colonies of vancomycin-resistant enteric coccus that was sensitive     only to linezolid.  We did elect not to treat the patient at this     time as she is asymptomatic and has been improving and afebrile     with  only vancomycin treatment.  As such we think that the     patient's fevers were secondary to her C. diff colitis.  If the     patient does develop worsening symptoms, she may need IV therapy to     treat her urinary tract infection.  PROCEDURES PERFORMED:  One-view chest x-ray was performed on March 22 which demonstrated stable cardiomegaly with low lung volumes and vascular congestion but no overt edema or focal pneumonia. Lumbar spine x-ray was performed, two to three views was performed on March 29, 2010, which demonstrated no acute findings.  Bilateral hip x-ray was performed on March 29, 2010, which demonstrated a negative exam. CT abdomen/pelvis with contrast media was performed on March 30, 2010, which demonstrated small, nonspecific right thyroid nodules, small pericardial effusion compared to prior study, lymphadenopathy as seen on prior imaging, no pulmonary edema, PE dissection or aortic aneurysm. CT chest with contrast was performed which demonstrated cardiomegaly and findings consistent with prior abdominal CT scan. One-view portable chest x-ray was performed on March 25  which demonstrated a left internal jugular central venous catheter tip at the cavoatrial junction with no pneumothorax. One-view chest x-ray was performed on March 25 which demonstrated a stable chest x-ray. An internal jugular central catheter was placed on March 25, 0000000, without complication.  CONSULTATIONS:  Infectious Disease.  BRIEF ADMITTING HISTORY AND PHYSICAL:  This is a 55 year old female with past medical history significant for cellulitis and MSSA bacteremia who presented to the dialysis center for routine dialysis treatment.  The patient was reportedly in good health until the night prior to admission and did not experience any pains or fevers.  The patient's left cellulitis is improved and edema is decreased according to the patient and her family.  During dialysis, the patient developed a fever and her blood pressure dropped such that it was in the systolic [id number] range. The patient was transferred to [facility] ED and was noted to have hypertension in the 80s.  The patient also had a fever of 103 degrees. She has responded well to hydration and her blood pressure improved to systolic of 0000000 which is her baseline.  The patient is mentating well. The patient denies any diarrhea, vomiting, dizziness, night sweats, cough, congestion, chest pain or bowel or bladder disturbances.  Of note, the patient also has had complaints of a fall which occurred the morning prior to her admission when she was getting out of the bath tub, it was mechanical.  She did not hit her hear.  She did not pass out. She simply slipped on the floor.  At the time of admission, she was having 6/10 pain in her left gluteus radiating down in her leg.  She denied any decreased range of motion or significant exacerbating factors as her pain was constant.  ALLERGIES:  The patient has no known drug allergies.  PHYSICAL EXAMINATION:  VITAL SIGNS:  On admission; temperature 103.4, pulse 90, blood pressure  66/44, respiratory rate 20, satting 98% on room air. GENERAL:  Alert, cooperative to exam, no distress. HEAD:  Normocephalic, atraumatic. EYES:  PERRL, conjunctivae clear. NOSE:  Septum is midline.  No drainage. THROAT:  Trachea is midline, no JVD. LUNGS:  Crackles at the bases bilaterally.  Respirations are unlabored. HEART:  Regular rate and rhythm, 3/6 systolic murmur without gallops or rub. ABDOMEN:  Soft, nontender, normal bowel sounds in all 4 quadrants. EXTREMITIES:  No cyanosis or edema.  Old skin changes likely from peripheral vascular disease on  the left leg with no signs of cellulitis. Pulses 2+ and symmetric in all extremities. NEUROLOGIC:  Nonfocal.  LABS ON ADMISSION:  Hemoglobin 10.4, hematocrit 33.2, platelet count 132, white blood cell count 1.0 with 0 neutrophils on differential. BMET; sodium 129, potassium 3.7, chloride 94, bicarb 29, glucose 86, BUN 18, creatinine 4.18. Procalcitonin 14.46.  HOSPITAL COURSE BY PROBLEM: 1. Sepsis, secondary to clostridium difficile colitis:  The patient     presented to the hospital with signs of sepsis and the etiology was     unclear, but she was found to be neutropenic as such the     differential diagnoses was brought and included endovascular     sources, urinary tract infection, intraabdominal or thoracic     abscess versus pneumonia.  Given the broad differential, the     patient was started on broad-spectrum antibiotics including     vancomycin and ceftazidime.  There was also concern as to whether     or not this could represent subacute bacterial endocarditis but the     patient has had a normal TEE approximately 1 month ago, so this was     felt to be less likely.  The patient had blood cultures x2 drawn     that remained negative throughout her hospitalization.  CT scan of     the chest and abdomen did not demonstrate any signs of infection     and the patient's graft appeared to also be uninfected.  We did      discuss this with the renal team, and they felt that endograft     infections without outside signs are very atypical and recommended     that we just continue to monitor clinically.  We did decide to     start Neupogen to try to increase the patient's immune response to     this infection and she responded quite well with her last white     blood cell count being 9.6 on March 28.  Neupogen was discontinued     given that her white count had normalized.  The patient was noted     to develop diarrhea during her hospital stay and is unclear as to     whether or not she may have had this prior to her admission.     Because of that a C. diff PCR assay was performed which returned     positive and the patient was started on a 14-day course of p.o.     vancomycin.  The patient did have urinary culture which returned     with greater than 100,000 colonies of vancomycin-resistant     enterococcus, at which point in time, the patient had already 10     days of vancomycin.  Because the patient had remained clinically     stable and her fever had resolved upon treatment of her C. diff     colitis, it was decided that this was likely simply asymptomatic     bacteriuria and that the patient would not require a treatment at     this time.  That being said if the patient does develop future     signs of urinary tract infection, she should be treated and this     particular strain is only sensitive to linezolid.  On the day of     the patient's discharge, her white blood cell count was back within     normal range, she had been afebrile for  greater than 48 hours and     she was at her baseline medical conditions. 2. Enterococcus urinary tract infection:  Although as mentioned above,     the patient does have culture data demonstrating greater than     100,000 colony forming units of Enterococcus, the patient denies     any current dysuria.  As a result this is felt to be secondary to     asymptomatic  bacteria and we discussed the case with ID who     recommended that we provide no further treatment at this time given     the patient's drastic clinical improvement.  That being said, as     mentioned previously this should continued to be followed as an     outpatient and if the patient develops signs or symptoms of UTI,     repeat culture should be considered with the knowledge that she may     require IV linezolid for treatment. 3. Mechanical fall leading to left leg pain.  The patient simply     slipped on her floor getting out of her bath tub on the day of her     admission.  This is atypical for her and she has not had any other     falls.  I recommended that she ensure that her house is safe with     mats she gets out of the bathtub, the patient states that this is     the usual state of her health and she is not sure what happened to     the bath mat that she typically uses.  An x-ray was performed of     the hip and thigh as well as lumbar spine and no fractures were     noted.  The patient denied any further pain in her hip or legs on     the day of her discharge and it is not felt that any further workup     is needed at this time. 4. Human immunodeficiency virus.  The patient has not been on any HIV     medications and her most recent CD-4 count was found to be 90.  As     a result she was started on Bactrim prophylaxis for PCP.  The     patient's major barrier to taking her medications is the fact that     her daughters are unaware of this diagnosis.  This makes it     difficult for her to take her medications discretely.  We did     restart the patient's medications and she will be discharged on     Reyataz, lamivudine, Norvir, and Viread.  I also have ordered for     her to receive Home Health in order to help her assist her with     medical management.  I think that this will help with the patient's     compliance to her medical regimen and she understands the      importance of continued compliance.  I would recommend continued     close following of the patient's CD-4 count as well as her viral     load on an outpatient basis to ensure that she continues to take     her medications and continues to reconstitute her immune system. 5. MGUS.  This has been worked up in the past by bone marrow biopsy     and no malignancy was identified.  The patient  was placed on     neutropenic precautions here as she did have a white blood cell     count of 1 as well as 0 neutrophils on differential.  The patient     was given Neupogen during her stay which greatly improved her white     count back into the normal range and increased her percent     neutrophils to 69% as it was not felt that she needed to continue     on her Neupogen nor should she continue to remain on neutropenic     precautions.  I would continue to monitor the patient's white blood     cell count with further referral to Hematology/Oncology if her     condition continues to decline.  DISCHARGE VITAL SIGNS:  On the day of discharge, the patient's temperature was 98.5, blood pressure 131/79, pulse of 80, respirations 18, satting at 95% on 2 L by nasal cannula.  This was only for comfort and the patient had been satting well on room air prior.     Jola Schmidt, MD   ______________________________ Acquanetta Chain, D.O.    BB/MEDQ  D:  04/04/2010  T:  04/05/2010  Job:  [id number]  cc:   [facility]  Electronically Signed by Jola Schmidt MD on 05/23/2010 10:35:32 AM Electronically Signed by Lane Hacker D.O. on 05/29/2010 01:48:13 PM

## 2010-05-30 ENCOUNTER — Inpatient Hospital Stay (HOSPITAL_COMMUNITY): Payer: Medicaid Other

## 2010-05-30 DIAGNOSIS — A419 Sepsis, unspecified organism: Secondary | ICD-10-CM

## 2010-05-30 DIAGNOSIS — R6520 Severe sepsis without septic shock: Secondary | ICD-10-CM

## 2010-05-30 DIAGNOSIS — R6521 Severe sepsis with septic shock: Secondary | ICD-10-CM

## 2010-05-30 LAB — CBC
HCT: 39.2 % (ref 36.0–46.0)
HCT: 38.3 % (ref 36.0–46.0)
Hemoglobin: 12.8 g/dL (ref 12.0–15.0)
Hemoglobin: 12.2 g/dL (ref 12.0–15.0)
MCH: 33.2 pg (ref 26.0–34.0)
MCH: 32.3 pg (ref 26.0–34.0)
MCHC: 32.7 g/dL (ref 30.0–36.0)
MCHC: 31.9 g/dL (ref 30.0–36.0)
MCV: 101.6 fL — ABNORMAL HIGH (ref 78.0–100.0)
MCV: 101.3 fL — ABNORMAL HIGH (ref 78.0–100.0)
Platelets: 147 10*3/uL — ABNORMAL LOW (ref 150–400)
Platelets: 143 10*3/uL — ABNORMAL LOW (ref 150–400)
RBC: 3.86 MIL/uL — ABNORMAL LOW (ref 3.87–5.11)
RBC: 3.78 MIL/uL — ABNORMAL LOW (ref 3.87–5.11)
RDW: 16.5 % — ABNORMAL HIGH (ref 11.5–15.5)
RDW: 16.3 % — ABNORMAL HIGH (ref 11.5–15.5)
WBC: 7.1 10*3/uL (ref 4.0–10.5)
WBC: 5.8 10*3/uL (ref 4.0–10.5)

## 2010-05-30 LAB — BASIC METABOLIC PANEL
BUN: 61 mg/dL — ABNORMAL HIGH (ref 6–23)
CO2: 24 mEq/L (ref 19–32)
Calcium: 8.2 mg/dL — ABNORMAL LOW (ref 8.4–10.5)
Chloride: 97 mEq/L (ref 96–112)
Creatinine, Ser: 5.3 mg/dL — ABNORMAL HIGH (ref 0.4–1.2)
GFR calc Af Amer: 10 mL/min — ABNORMAL LOW (ref >60)
GFR calc non Af Amer: 8 mL/min — ABNORMAL LOW (ref >60)
Glucose, Bld: 124 mg/dL — ABNORMAL HIGH (ref 70–99)
Potassium: 4 mEq/L (ref 3.5–5.1)
Sodium: 132 mEq/L — ABNORMAL LOW (ref 135–145)

## 2010-05-30 LAB — RENAL FUNCTION PANEL
Albumin: 1.7 g/dL — ABNORMAL LOW (ref 3.5–5.2)
BUN: 62 mg/dL — ABNORMAL HIGH (ref 6–23)
CO2: 24 mEq/L (ref 19–32)
Calcium: 9 mg/dL (ref 8.4–10.5)
Chloride: 97 mEq/L (ref 96–112)
Creatinine, Ser: 5.8 mg/dL — ABNORMAL HIGH (ref 0.4–1.2)
GFR calc Af Amer: 9 mL/min — ABNORMAL LOW (ref >60)
GFR calc non Af Amer: 8 mL/min — ABNORMAL LOW (ref >60)
Glucose, Bld: 105 mg/dL — ABNORMAL HIGH (ref 70–99)
Phosphorus: 4.1 mg/dL (ref 2.3–4.6)
Potassium: 3.3 mEq/L — ABNORMAL LOW (ref 3.5–5.1)
Sodium: 133 mEq/L — ABNORMAL LOW (ref 135–145)

## 2010-05-30 LAB — HEPATITIS B SURFACE ANTIGEN: Hepatitis B Surface Ag: NEGATIVE

## 2010-05-30 LAB — MAGNESIUM: Magnesium: 2 mg/dL (ref 1.5–2.5)

## 2010-05-30 LAB — PHOSPHORUS: Phosphorus: 3.9 mg/dL (ref 2.3–4.6)

## 2010-05-31 ENCOUNTER — Telehealth: Payer: Self-pay | Admitting: Licensed Clinical Social Worker

## 2010-05-31 LAB — CBC
HCT: 40.4 % (ref 36.0–46.0)
Hemoglobin: 13 g/dL (ref 12.0–15.0)
MCH: 33.1 pg (ref 26.0–34.0)
MCHC: 32.2 g/dL (ref 30.0–36.0)
MCV: 102.8 fL — ABNORMAL HIGH (ref 78.0–100.0)
Platelets: 155 10*3/uL (ref 150–400)
RBC: 3.93 MIL/uL (ref 3.87–5.11)
RDW: 16.4 % — ABNORMAL HIGH (ref 11.5–15.5)
WBC: 3.9 10*3/uL — ABNORMAL LOW (ref 4.0–10.5)

## 2010-05-31 LAB — RENAL FUNCTION PANEL
Albumin: 1.9 g/dL — ABNORMAL LOW (ref 3.5–5.2)
BUN: 37 mg/dL — ABNORMAL HIGH (ref 6–23)
CO2: 28 mEq/L (ref 19–32)
Calcium: 9.5 mg/dL (ref 8.4–10.5)
Chloride: 99 mEq/L (ref 96–112)
Creatinine, Ser: 3.75 mg/dL — ABNORMAL HIGH (ref 0.4–1.2)
GFR calc Af Amer: 15 mL/min — ABNORMAL LOW (ref >60)
GFR calc non Af Amer: 13 mL/min — ABNORMAL LOW (ref >60)
Glucose, Bld: 107 mg/dL — ABNORMAL HIGH (ref 70–99)
Phosphorus: 3.4 mg/dL (ref 2.3–4.6)
Potassium: 3.5 mEq/L (ref 3.5–5.1)
Sodium: 136 mEq/L (ref 135–145)

## 2010-06-03 LAB — CULTURE, BLOOD (ROUTINE X 2)
Culture  Setup Time: 201205221303
Culture  Setup Time: 201205221303
Culture: NO GROWTH
Culture: NO GROWTH

## 2010-06-06 ENCOUNTER — Inpatient Hospital Stay (HOSPITAL_COMMUNITY)
Admission: AD | Admit: 2010-06-06 | Discharge: 2010-06-07 | DRG: 252 | Disposition: A | Discharge status: Home / Self Care | Payer: Medicaid Other | Source: Ambulatory Visit | Attending: Vascular Surgery | Admitting: Vascular Surgery

## 2010-06-06 DIAGNOSIS — T82898A Other specified complication of vascular prosthetic devices, implants and grafts, initial encounter: Principal | ICD-10-CM | POA: Diagnosis present

## 2010-06-06 DIAGNOSIS — I509 Heart failure, unspecified: Secondary | ICD-10-CM | POA: Diagnosis present

## 2010-06-06 DIAGNOSIS — I120 Hypertensive chronic kidney disease with stage 5 chronic kidney disease or end stage renal disease: Secondary | ICD-10-CM | POA: Diagnosis present

## 2010-06-06 DIAGNOSIS — B20 Human immunodeficiency virus [HIV] disease: Secondary | ICD-10-CM | POA: Diagnosis present

## 2010-06-06 DIAGNOSIS — N186 End stage renal disease: Secondary | ICD-10-CM | POA: Diagnosis present

## 2010-06-06 DIAGNOSIS — N059 Unspecified nephritic syndrome with unspecified morphologic changes: Secondary | ICD-10-CM | POA: Diagnosis present

## 2010-06-06 LAB — POCT I-STAT 4, (NA,K, GLUC, HGB,HCT)
Glucose, Bld: 70 mg/dL (ref 70–99)
HCT: 42 % (ref 36.0–46.0)
Hemoglobin: 14.3 g/dL (ref 12.0–15.0)
Potassium: 3.6 mEq/L (ref 3.5–5.1)
Sodium: 140 mEq/L (ref 135–145)

## 2010-06-06 LAB — SURGICAL PCR SCREEN
MRSA, PCR: NEGATIVE
Staphylococcus aureus: NEGATIVE

## 2010-06-07 ENCOUNTER — Ambulatory Visit: Payer: Medicaid Other | Admitting: Internal Medicine

## 2010-06-07 LAB — BASIC METABOLIC PANEL
BUN: 49 mg/dL — ABNORMAL HIGH (ref 6–23)
CO2: 26 mEq/L (ref 19–32)
Calcium: 8.4 mg/dL (ref 8.4–10.5)
Chloride: 100 mEq/L (ref 96–112)
Creatinine, Ser: 8.84 mg/dL — ABNORMAL HIGH (ref 0.4–1.2)
GFR calc Af Amer: 6 mL/min — ABNORMAL LOW (ref >60)
GFR calc non Af Amer: 5 mL/min — ABNORMAL LOW (ref >60)
Glucose, Bld: 63 mg/dL — ABNORMAL LOW (ref 70–99)
Potassium: 4 mEq/L (ref 3.5–5.1)
Sodium: 139 mEq/L (ref 135–145)

## 2010-06-07 LAB — CBC
HCT: 36.1 % (ref 36.0–46.0)
Hemoglobin: 11.5 g/dL — ABNORMAL LOW (ref 12.0–15.0)
MCH: 33 pg (ref 26.0–34.0)
MCHC: 31.9 g/dL (ref 30.0–36.0)
MCV: 103.7 fL — ABNORMAL HIGH (ref 78.0–100.0)
Platelets: 166 10*3/uL (ref 150–400)
RBC: 3.48 MIL/uL — ABNORMAL LOW (ref 3.87–5.11)
RDW: 17.2 % — ABNORMAL HIGH (ref 11.5–15.5)
WBC: 4.3 10*3/uL (ref 4.0–10.5)

## 2010-06-10 ENCOUNTER — Encounter: Payer: Self-pay | Admitting: Internal Medicine

## 2010-06-10 LAB — POCT I-STAT 4, (NA,K, GLUC, HGB,HCT)
Glucose, Bld: 68 mg/dL — ABNORMAL LOW (ref 70–99)
HCT: 46 % (ref 36.0–46.0)
Hemoglobin: 15.6 g/dL — ABNORMAL HIGH (ref 12.0–15.0)
Potassium: >9 mEq/L (ref 3.5–5.1)
Sodium: 124 mEq/L — ABNORMAL LOW (ref 135–145)

## 2010-06-10 NOTE — Discharge Summary (Signed)
[name], [name]                 ACCOUNT NO.:  [id number]  MEDICAL RECORD NO.:  [id number]           PATIENT TYPE:  I  LOCATION:  [id number]                         FACILITY:  [facility]  PHYSICIAN:  Larey Dresser, M.D.DATE OF BIRTH:  06/18/1955  DATE OF ADMISSION:  05/25/2010 DATE OF DISCHARGE:  05/31/2010                              DISCHARGE SUMMARY   CONTINUITY MEDICAL DOCTOR:  Janell Quiet, MD  DISCHARGE DIAGNOSES: 1. Septic shock, likely secondary to group B bacteremia and     Clostridium difficile colitis. 2. End-stage renal disease, on hemodialysis. 3. Human immunodeficiency virus with CD-4 count of 90 in 03/12 and VL- 160,000 in 5/12. 4. Pancytopenia. 5. History of methicillin-susceptible Staphylococcus aureus bacteremia     in July 2011 and February 2012. 6. Monoclonal gammopathy of undetermined significance. 7. Congestive heart failure with ejection fraction 35-40% per echo in     May 2012. 8. Chronic left lower extremity edema.  DISCHARGE MEDICATIONS WITH ACCURATE DOSES: 1. Flagyl 500 mg take 1 tablet by mouth twice daily for 8 more days,     the stop date being June 08, 2010. 2. Vancomycin 250 mg take 2 capsules by mouth twice daily for 8 more     days, stop date June 08, 2010. 3. Vancomycin IV to be given every time with hemodialysis, stop date     being June 26, 2010, for group B bacteremia, completing a total of     4 weeks of therapy. 4. Acetaminophen 325 mg tablet take 2 tablets by mouth every 4 hours     as needed for pain. 5. Calcium carbonate 500 mg take by mouth every 6 hours as needed. 6. Calcium acetate 667 mg tablet take 1 tablet by mouth twice daily     with meals. 7. Aranesp injection inject 100 mcg every Saturday with hemodialysis. 8. Docusate sodium enema to get 1 enema rectally as needed for     constipation. 9. Ferrous gluconate 12.5 mg/mL infection take intravenously every     Thursday with hemodialysis. 10.Midodrine 5 mg take 1 tablet every 6  hours. 11.Nutrition supplement soy takes 237 mL by mouth 3 times a day. 12.Prednisone 10 mg take 3 tablets by mouth on Jun 01, 2010, 2 tablets     by mouth on Jun 02, 2010, and 1 tablet by mouth on Jun 03, 2010,     and then stop. 13.Paricalcitol injection inject 6 mcg IV on Tuesday, Thursday, and     Saturday with hemodialysis. 14.Promethazine  25 mg take 1 suppository rectally twice daily as     needed for constipation. 15.Saccharomyces 250 mg take 250 mg by mouth twice daily for the next     15 days. 16.Bactrim 80/400 mg take 1 tablet by mouth daily at bedtime. 17.Reyataz 150 mg capsules take 2 capsules by mouth daily. 18.Lamivudine 10 mg/mL solution take 25 mg by mouth daily. 19.Nephro-Vite renal vitamin take 1 tablet by mouth daily. 20.Pepcid 20 mg take 1 tablet by mouth daily. 21.Ritonavir 100 mg take 1 tablet by mouth daily. 22.Stavudine 20 mg capsule take 1 tablet by mouth daily with meals.  23.Vitamin B complex take 1 tablet by mouth daily.  DISPOSITION AND FOLLOWUP:  The patient was discharged home in stable and improved condition from [facility] on May 31, 2010.  The patient has been scheduled a followup appointment with Dr. Leonia Reeves on June 07, 2010, at 10 a.m.  At that time, we need to make sure that she is taking all her medications for C. diff colitis and group B bacteremia. Also, the patient should be emphasized on medical adherence and compliance given her history of noncompliance with HIV medications.  PROCEDURES PERFORMED: 1. Right subclavian central line on May 24, 2010, that was pulled out     prior to her discharge. 2. Radial arterial line for monitoring her blood pressure on May 24, 2010. 3. NG tube on May 24, 2010. 4. Acute abdominal x-ray on May 24, 2010, that showed stable     cardiomegaly and vascular congestion.  Nonobstructive bowel gas     pattern. 5. Chest x-ray on May 25, 2010, that showed cardiomegaly and vascular     congestion. 6. CT  of abdomen on May 25, 2010, which was negative for abscess.     Progression in abdominal pelvic lymphadenopathy.  Lymph nodes may     be reactive, but lymphoproliferative process could create similar     appearance.  Changes of renal osteodystrophy.  Marked cardiomegaly     and small to moderate pericardial effusion. 7. Chest x-ray on May 25, 2010, which showed stable exam.  Enlargement     of cardiopericardial silhouette with pulmonary vascular congestion. 8. Chest x-ray on May 26, 2010, which showed no significant changes in     pulmonary vascular congestion. 9. Chest x-ray on May 27, 2010, which showed improved pulmonary venous     hypertension.  Slight worsening of the atelectasis of the right     lung base. 10.Chest x-ray on May 28, 2010, which showed no significant change.     Mild cardiomegaly and central venous congestion.  CONSULTATION: 1. Pulmonary Critical Care for septic shock. 2. ID for group B bacteremia. 3. Renal for hemodialysis.  ADMITTING HISTORY AND PHYSICAL:  [name] is a 55 year old woman with past medical history significant for AIDS, CD-4 count of 90 in March 2012, end-stage renal disease on home dialysis who was recently discharged from hospital in March where she was admitted for sepsis secondary to C. diff colitis and got treated with 10 days of oral vancomycin, who presented to the ER again on May 25, 2010, for abdominal pain and worsening of her chronic left lower extremity pain.  She was found to be in septic shock, likely secondary to C. diff colitis again and group B bacteremia for which she was admitted under Pulmonary Critical Care Service.  After her septic shock resolved, she was transferred to Teaching Service for her further management.  The patient's abdominal pain was described as crampy at her admission and was associated with nausea and emesis and loose stools.  VITAL SIGNS:  Blood pressure 96/50, CVP 12, heart rate 96, temperature 100.3,  and O2 sats 99% on 3 liters nasal cannula. GENERAL:  Awake, alert, in no acute distress, she appears weak, flushed. HEENT:  Pupils are equal, round, and reactive to light, mucous membranes moist and pink. NECK:  Supple.  No lymphadenopathy. CHEST:  Some bibasilar fine rales, but no wheezes or rhonchi. CARDIAC:  2/6 systolic ejection murmur noted in left upper sternal border. ABDOMEN:  Nondistended, nontender, soft,  active bowel sounds. EXTREMITIES:  No joint effusion or deformity.  There is chronic brawny skin changes in the left leg below the knee, blackish discoloration of skin, and 1+ nonpitting edema on the left.  Pulses are 2+ and 1 on the right and 1+ on the left foot. NEUROLOGIC:  Alert, oriented x3, no focal deficits.  LABORATORY DATA ON ADMISSION:  White cells 2.28, hemoglobin 12.6, hematocrit 39.4, and platelets of 88.  Lactic acid of 2.3.  Sodium 134, potassium 4.2, chloride 98, bicarb 23, glucose 93, BUN 49, creatinine 8.29.  Lipase 41, procalcitonin 37.5.  MRSA by PCR positive.  Random cortisol level of 23.3.  HOSPITAL COURSE BY PROBLEM: 1. Septic shock.  The patient presented to the hospital with abdominal     pain, nausea, and some emesis.  She was febrile and hypotensive on exam.     She fitted into the criteria for septic shock, which was thought     likely secondary to her recurrent C. diff colitis and group B     bacteremia.  The patient was initiated on early goal-directed     therapy protocol for her septic shock.  During day #2 or #3 of     hospitalization, she met goals for septic shock protocol with CVP     more than 12 and MAP more than 65, SvO2 more than 70%.  The patient     also required to be on pressor in the setting of low blood pressure     for initial couple of days, but when her blood pressure went up her     pressors were discontinued.  The patient was initially started on     linezolid( h/o asymptomatic VRE UTI in 03/12), Flagyl, and Zosyn      that she received for 2-3 days, but when the results of her      cultures were back Zosyn, linezold was discontinued     and the patient was changed to ampicillin for her group B     Streptococcus infection.  As per ID recommend, the patient needs to     get treated with 4 weeks of therapy for her group B bacteremia and     14 days of doxycycline for C. diff colitis.  The patient was     started on ampicillin in the hospital for her group B bacteremia,     but for the ease of administration it was decided to discharge her     on vancomycin.  The patient would be getting treated with vancomycin at     her hemodialysis for her bacteremia. 2. HIV.  The patient has a history of noncompliance with     antiretroviral therapy, but she was continued on her home regimen     here.  She was counseled on medical adherence and she said that she     will take her medicines.  The patient was given Bactrim for PCP     prophylaxis. 3. Pancytopenia.  Her counts remained stable throughout her hospital     stay. 4. End-stage renal disease, on hemodialysis, likely secondary to HIV.     The patient used to get hemodialysis on Tuesday, Thursday, and     Saturday.  DISCHARGE LABORATORY DATA AND VITAL SIGNS:  Her discharge vitals include temperature 98, pulse 72, respiratory rate 18, blood pressure 116/74, and O2 sats 96% on room air.  Her discharge labs include sodium 136, potassium 3.5, chloride 99, bicarb 28, glucose 107, BUN  37, creatinine 3.75, white cell count 3.9, hemoglobin 13, hematocrit 40.4, and platelet of 155.    ______________________________ Pedro Earls, MD   ______________________________ Larey Dresser, M.D.    MS/MEDQ  D:  05/31/2010  T:  06/01/2010  Job:  [id number]  cc:   Janell Quiet, MD Alcide Evener, MD  Electronically Signed by Pedro Earls MD on 06/03/2010 09:33:03 PM Electronically Signed by Larey Dresser M.D. on 06/10/2010 12:06:04 PM

## 2010-06-13 NOTE — Op Note (Signed)
[name], [name]                 ACCOUNT NO.:  [id number]  MEDICAL RECORD NO.:  [id number]           PATIENT TYPE:  I  LOCATION:  [id number]                         FACILITY:  [facility]  PHYSICIAN:  Judeth Cornfield. Scot Dock, M.D.DATE OF BIRTH:  09/04/1955  DATE OF PROCEDURE:  06/06/2010 DATE OF DISCHARGE:                              OPERATIVE REPORT   PREOPERATIVE DIAGNOSIS:  Clotted right upper arm arteriovenous graft.  POSTOPERATIVE DIAGNOSIS:  Clotted right upper arm arteriovenous graft.  PROCEDURE: 1. Thrombectomy revision of right upper arm arteriovenous graft. 2. 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  SURGEON:  Judeth Cornfield. Scot Dock, MD  ASSISTANT:  Nurse.  ANESTHESIA:  Local with sedation.  INDICATIONS:  For the details of her history, please see the dictated consult note.  Briefly, she had a right upper arm AV graft, which was placed on June 14, 2008.  She had one previous thrombectomy and required replacement of a degenerative segment of her graft.  She presents again with a clotted graft.  TECHNIQUE:  The patient was taken to the operating room, sedated by Anesthesia.  The right upper extremity was prepped and draped in usual sterile fashion.  I elected to explore the venous end, as she had not had any previous revisions, and I suspected that she had intimal hyperplasia present here.  She also had an area of either infiltrate with a hematoma versus an aneurysm of her graft, which cannot be determined as her graft was occluded.  This was in the upper part of her graft.  After the skin was anesthetized with 1% lidocaine, the incision of the axilla was opened, and the venous limb of the graft was dissected free.  This was an end-to-end anastomosis to the axillary vein.  I dissected up higher on the actually vein, which was quite large.  This was clamped, and the venous anastomosis taken down.  There was intimal hyperplasia a the venous  anastomosis.  Graft thrombectomy was then achieved using #4 Fogarty catheter.  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 I anesthetized the skin medial to this area and opened this and then resected.  A portion of this aneurysm removed with a lot of old retained clot and then plicated the aneurysm with a running 6-0 Prolene suture. This incision was closed with 4-0 Vicryl.  Next, completion of thrombectomy was performed of her graft, and the arterial plug was clearly retrieved.  The graft was flushed with heparinized saline and clamp.  A new segment of 7-mm graft was brought to the field, sewn end- to-end of the old graft using continuous 6-0 Prolene suture.  It was then pulled to the appropriate length for anastomosis to the axillary vein.  The vein was slightly spatulated, the graft spatulate with two sewn end-to-end continuous 6-0 Prolene suture.  At the completion, there was an excellent thrill in the graft and a palpable radial pulse. Hemostasis was obtained in the wounds then the wound in the axilla, and this was closed with a deep layer of 3-0 Vicryl, and the skin closed with 4-0 Vicryl.  Sterile dressing was applied.  The patient tolerated the procedure well, was transferred to the recovery room in stable condition.  All needle and sponge counts were correct.     Judeth Cornfield. Scot Dock, M.D.     CSD/MEDQ  D:  06/06/2010  T:  06/07/2010  Job:  [id number]  Electronically Signed by [name] M.D. on 06/13/2010 03:15:37 PM

## 2010-06-13 NOTE — Consult Note (Signed)
[name], [name]                 ACCOUNT NO.:  [id number]  MEDICAL RECORD NO.:  [id number]           PATIENT TYPE:  I  LOCATION:  [id number]                         FACILITY:  [facility]  PHYSICIAN:  Judeth Cornfield. Scot Dock, M.D.DATE OF BIRTH:  [birth year]  DATE OF CONSULTATION:  06/06/2010 DATE OF DISCHARGE:                                CONSULTATION   REASON FOR CONSULTATION:  Clotted right upper arm AV graft.  Consult is from is [employer].  HISTORY:  This is a pleasant 55 year old woman with chronic kidney disease who presents with a clotted right upper arm AV graft.  She has exhausted all options in her left arm.  She was unable to have a fistula on the left because the veins were too small, and ultimately, she had a left upper arm loop AV graft because of high bifurcation of the brachial artery.  The graft had to be removed because of venous hypertension in October 2009.  She then had some bleeding problems where she had a vein patch, this was replaced with a bovine pericardial patch, and ultimately, she required a left axillary or a radial bypass with right greater saphenous vein, so no further access can be done in the left arm.  On the right side on May 15, 2008, she had a right brachiocephalic AV fistula, forearm cephalic vein was explored and was too small.  Her most recent access was a new right upper arm AV graft which was placed on June 14, 2008.  This clotted once previously on July 28, 2009.  At that time, she had presented with bleeding from her graft and the paramedics had held pressure to control the bleeding and she presented with a clotted graft and area of degenerative graft was replaced.  In reviewing her hospital records, I do not see any other times that she has had thrombolysis of this right upper arm graft.  After her most recent thrombectomy and revision on July 28, 2009, she has had no further revisions.  However, today she was noted to have a  clotted graft.  Of note, she has had no uremic symptoms.  Specifically, she denies nausea, vomiting, palpitations, anorexia, fatigue, or shortness of breath.  PAST MEDICAL HISTORY:  Significant for HIV-2.  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  She began hemodialysis in October 2009.  The patient has a history of congestive heart failure with an ejection fraction of 35-40% by echo in May 2012.  She also has a history of pancytopenia and she was recently admitted with septic shock from May 25, 2010, to May 31, 2010, secondary to a group B bacteremia and also a Clostridium difficile colitis.  ID had recommended the patient receive 4 weeks of therapy for the group B bacteremia and 14 days of doxycycline for the C. diff colitis.  FAMILY HISTORY:  She is unaware of any history of premature cardiovascular disease.  SOCIAL HISTORY:  She does not smoke.  REVIEW OF SYSTEMS:  GENERAL:  She has had no recent weight loss, weight gain, or problems with her appetite.  CARDIOVASCULAR:  She has  had no chest pain, chest pressure, palpitations, or arrhythmias.  PULMONARY: She has had no productive cough, bronchitis, asthma, or wheezing. GASTROINTESTINAL:  She has had no recent change in her bowel habits. GENITOURINARY:  She is anuric.  MUSCULOSKELETAL:  She does have some generalized arthritis.  NEUROLOGIC:  She has had no dizziness, blackouts, headaches, or seizures.  INTEGUMENTARY:  She has had no ulcers or rashes.  PSYCHIATRIC:  She has had no depression or anxiety.  PHYSICAL EXAMINATION:  GENERAL:  This is a pleasant 55 year old woman who appears her stated age. VITAL SIGNS:  Temperature is 98.7, heart rate is 68, blood pressure is 138/62, saturation 99%. HEENT:  Unremarkable. LUNGS:  Clear bilaterally to auscultation. CARDIOVASCULAR:  She has a regular rate and rhythm.  She has an occluded right upper arm AV graft.  There appears to be a small hematoma in the upper  part of her graft, possibly from an infiltrate today.  She has a palpable radial pulse in the right. ABDOMEN:  Soft, nontender with normal-pitched bowel sounds. MUSCULOSKELETAL:  No major deformities or cyanosis. NEUROLOGIC:  She has no focal weakness or paresthesias.  I have reviewed her chest x-ray which shows mild to moderate congestive heart failure.  I have reviewed her EKG which shows a normal sinus rhythm.  I have reviewed her labs which show potassium of 3.6.  IMPRESSION:  This patient presents with a clotted right upper arm arteriovenous graft.  It was most recently thrombectomized in July 2011. She had been referred to Interventional Radiology, and they stated they were unable to perform thrombolysis today and therefore, we will attempt surgical thrombectomy.  I have explained that there is a small chance that we find a graft infection that we would have to remove the graft and then place a catheter after her infection had cleared.  If we were unable to salvage the graft, she would also need to have a catheter. She does not appear to have any further options for access in the left arm, and this appears to be her last option for access in the right arm. I have reviewed the procedure and potential complications, and all of her questions were answered.  She is agreeable to proceed.     Judeth Cornfield. Scot Dock, M.D.     CSD/MEDQ  D:  06/06/2010  T:  06/07/2010  Job:  [id number]  cc:   [facility]  Electronically Signed by [name] M.D. on 06/13/2010 03:15:33 PM

## 2010-07-02 NOTE — Telephone Encounter (Signed)
Phone note complete

## 2010-07-03 ENCOUNTER — Ambulatory Visit: Payer: Medicaid Other | Admitting: Internal Medicine

## 2010-07-14 LAB — AFB CULTURE, BLOOD

## 2010-08-05 ENCOUNTER — Encounter: Payer: Medicaid Other | Admitting: Internal Medicine

## 2010-09-09 ENCOUNTER — Inpatient Hospital Stay (HOSPITAL_COMMUNITY): Payer: Medicaid Other

## 2010-09-09 ENCOUNTER — Emergency Department (HOSPITAL_COMMUNITY): Payer: Medicaid Other

## 2010-09-09 ENCOUNTER — Inpatient Hospital Stay (HOSPITAL_COMMUNITY)
Admission: EM | Admit: 2010-09-09 | Discharge: 2010-10-01 | DRG: 252 | Disposition: A | Discharge status: Skilled Nursing Facility | Payer: Medicaid Other | Attending: Internal Medicine | Admitting: Internal Medicine

## 2010-09-09 DIAGNOSIS — Y832 Surgical operation with anastomosis, bypass or graft as the cause of abnormal reaction of the patient, or of later complication, without mention of misadventure at the time of the procedure: Secondary | ICD-10-CM | POA: Diagnosis present

## 2010-09-09 DIAGNOSIS — R5381 Other malaise: Secondary | ICD-10-CM | POA: Diagnosis not present

## 2010-09-09 DIAGNOSIS — R1032 Left lower quadrant pain: Secondary | ICD-10-CM | POA: Diagnosis present

## 2010-09-09 DIAGNOSIS — A419 Sepsis, unspecified organism: Secondary | ICD-10-CM

## 2010-09-09 DIAGNOSIS — Y92009 Unspecified place in unspecified non-institutional (private) residence as the place of occurrence of the external cause: Secondary | ICD-10-CM

## 2010-09-09 DIAGNOSIS — E46 Unspecified protein-calorie malnutrition: Secondary | ICD-10-CM | POA: Diagnosis not present

## 2010-09-09 DIAGNOSIS — Z992 Dependence on renal dialysis: Secondary | ICD-10-CM

## 2010-09-09 DIAGNOSIS — K219 Gastro-esophageal reflux disease without esophagitis: Secondary | ICD-10-CM | POA: Diagnosis present

## 2010-09-09 DIAGNOSIS — B20 Human immunodeficiency virus [HIV] disease: Secondary | ICD-10-CM | POA: Diagnosis present

## 2010-09-09 DIAGNOSIS — R6521 Severe sepsis with septic shock: Secondary | ICD-10-CM | POA: Diagnosis present

## 2010-09-09 DIAGNOSIS — N039 Chronic nephritic syndrome with unspecified morphologic changes: Secondary | ICD-10-CM | POA: Diagnosis present

## 2010-09-09 DIAGNOSIS — I428 Other cardiomyopathies: Secondary | ICD-10-CM | POA: Diagnosis present

## 2010-09-09 DIAGNOSIS — Z79899 Other long term (current) drug therapy: Secondary | ICD-10-CM

## 2010-09-09 DIAGNOSIS — Z91199 Patient's noncompliance with other medical treatment and regimen due to unspecified reason: Secondary | ICD-10-CM

## 2010-09-09 DIAGNOSIS — R6520 Severe sepsis without septic shock: Secondary | ICD-10-CM | POA: Diagnosis present

## 2010-09-09 DIAGNOSIS — N186 End stage renal disease: Secondary | ICD-10-CM | POA: Diagnosis present

## 2010-09-09 DIAGNOSIS — T827XXA Infection and inflammatory reaction due to other cardiac and vascular devices, implants and grafts, initial encounter: Principal | ICD-10-CM | POA: Diagnosis present

## 2010-09-09 DIAGNOSIS — A4101 Sepsis due to Methicillin susceptible Staphylococcus aureus: Secondary | ICD-10-CM | POA: Diagnosis present

## 2010-09-09 DIAGNOSIS — E876 Hypokalemia: Secondary | ICD-10-CM | POA: Diagnosis not present

## 2010-09-09 DIAGNOSIS — Z8614 Personal history of Methicillin resistant Staphylococcus aureus infection: Secondary | ICD-10-CM

## 2010-09-09 DIAGNOSIS — I509 Heart failure, unspecified: Secondary | ICD-10-CM | POA: Diagnosis present

## 2010-09-09 DIAGNOSIS — I120 Hypertensive chronic kidney disease with stage 5 chronic kidney disease or end stage renal disease: Secondary | ICD-10-CM | POA: Diagnosis present

## 2010-09-09 DIAGNOSIS — J189 Pneumonia, unspecified organism: Secondary | ICD-10-CM | POA: Diagnosis not present

## 2010-09-09 DIAGNOSIS — I5022 Chronic systolic (congestive) heart failure: Secondary | ICD-10-CM | POA: Diagnosis present

## 2010-09-09 DIAGNOSIS — Z9119 Patient's noncompliance with other medical treatment and regimen: Secondary | ICD-10-CM

## 2010-09-09 DIAGNOSIS — D631 Anemia in chronic kidney disease: Secondary | ICD-10-CM | POA: Diagnosis present

## 2010-09-09 DIAGNOSIS — N049 Nephrotic syndrome with unspecified morphologic changes: Secondary | ICD-10-CM | POA: Diagnosis present

## 2010-09-09 DIAGNOSIS — I70209 Unspecified atherosclerosis of native arteries of extremities, unspecified extremity: Secondary | ICD-10-CM | POA: Diagnosis present

## 2010-09-09 DIAGNOSIS — N2581 Secondary hyperparathyroidism of renal origin: Secondary | ICD-10-CM | POA: Diagnosis present

## 2010-09-09 LAB — CBC
HCT: 35.1 % — ABNORMAL LOW (ref 36.0–46.0)
Hemoglobin: 11.5 g/dL — ABNORMAL LOW (ref 12.0–15.0)
MCH: 30.8 pg (ref 26.0–34.0)
MCHC: 32.8 g/dL (ref 30.0–36.0)
MCV: 94.1 fL (ref 78.0–100.0)
Platelets: 43 10*3/uL — ABNORMAL LOW (ref 150–400)
RBC: 3.73 MIL/uL — ABNORMAL LOW (ref 3.87–5.11)
RDW: 17.5 % — ABNORMAL HIGH (ref 11.5–15.5)
WBC: 6.6 10*3/uL (ref 4.0–10.5)

## 2010-09-09 LAB — COMPREHENSIVE METABOLIC PANEL
ALT: 20 U/L (ref 0–35)
AST: 60 U/L — ABNORMAL HIGH (ref 0–37)
Albumin: 1.4 g/dL — ABNORMAL LOW (ref 3.5–5.2)
Alkaline Phosphatase: 140 U/L — ABNORMAL HIGH (ref 39–117)
BUN: 25 mg/dL — ABNORMAL HIGH (ref 6–23)
CO2: 27 mEq/L (ref 19–32)
Calcium: 7.6 mg/dL — ABNORMAL LOW (ref 8.4–10.5)
Chloride: 102 mEq/L (ref 96–112)
Creatinine, Ser: 6.05 mg/dL — ABNORMAL HIGH (ref 0.50–1.10)
GFR calc Af Amer: 9 mL/min — ABNORMAL LOW (ref >60)
GFR calc non Af Amer: 7 mL/min — ABNORMAL LOW (ref >60)
Glucose, Bld: 90 mg/dL (ref 70–99)
Potassium: 3.1 mEq/L — ABNORMAL LOW (ref 3.5–5.1)
Sodium: 136 mEq/L (ref 135–145)
Total Bilirubin: 1.1 mg/dL (ref 0.3–1.2)
Total Protein: 7.6 g/dL (ref 6.0–8.3)

## 2010-09-09 LAB — LACTIC ACID, PLASMA: Lactic Acid, Venous: 3.3 mmol/L — ABNORMAL HIGH (ref 0.5–2.2)

## 2010-09-09 LAB — POCT I-STAT TROPONIN I: Troponin i, poc: 0.29 ng/mL (ref 0.00–0.08)

## 2010-09-09 LAB — DIFFERENTIAL
Basophils Absolute: 0.1 10*3/uL (ref 0.0–0.1)
Basophils Relative: 1 % (ref 0–1)
Eosinophils Absolute: 0 10*3/uL (ref 0.0–0.7)
Eosinophils Relative: 0 % (ref 0–5)
Lymphocytes Relative: 27 % (ref 12–46)
Lymphs Abs: 1.8 10*3/uL (ref 0.7–4.0)
Monocytes Absolute: 0.8 10*3/uL (ref 0.1–1.0)
Monocytes Relative: 12 % (ref 3–12)
Neutro Abs: 3.9 10*3/uL (ref 1.7–7.7)
Neutrophils Relative %: 60 % (ref 43–77)

## 2010-09-09 LAB — CLOSTRIDIUM DIFFICILE BY PCR: Toxigenic C. Difficile by PCR: NEGATIVE

## 2010-09-09 LAB — APTT: aPTT: 52 seconds — ABNORMAL HIGH (ref 24–37)

## 2010-09-09 LAB — GLUCOSE, CAPILLARY
Glucose-Capillary: 69 mg/dL — ABNORMAL LOW (ref 70–99)
Glucose-Capillary: 104 mg/dL — ABNORMAL HIGH (ref 70–99)
Glucose-Capillary: 82 mg/dL (ref 70–99)
Glucose-Capillary: 82 mg/dL (ref 70–99)

## 2010-09-09 LAB — POCT I-STAT 3, ART BLOOD GAS (G3+)
Acid-Base Excess: 3 mmol/L — ABNORMAL HIGH (ref 0.0–2.0)
Bicarbonate: 28.4 mEq/L — ABNORMAL HIGH (ref 20.0–24.0)
O2 Saturation: 96 %
Patient temperature: 100.8
TCO2: 30 mmol/L (ref 0–100)
pCO2 arterial: 48.4 mmHg — ABNORMAL HIGH (ref 35.0–45.0)
pH, Arterial: 7.382 (ref 7.350–7.400)
pO2, Arterial: 87 mmHg (ref 80.0–100.0)

## 2010-09-09 LAB — TYPE AND SCREEN
ABO/RH(D): O POS
Antibody Screen: NEGATIVE

## 2010-09-09 LAB — MRSA PCR SCREENING: MRSA by PCR: NEGATIVE

## 2010-09-09 LAB — D-DIMER, QUANTITATIVE (NOT AT ARMC): D-Dimer, Quant: 1.97 ug/mL-FEU — ABNORMAL HIGH (ref 0.00–0.48)

## 2010-09-09 LAB — CARBOXYHEMOGLOBIN
Carboxyhemoglobin: 1.3 % (ref 0.5–1.5)
Methemoglobin: 0.3 % (ref 0.0–1.5)
O2 Saturation: 87.6 %
Total hemoglobin: 10.4 g/dL — ABNORMAL LOW (ref 12.5–16.0)

## 2010-09-09 LAB — PROCALCITONIN: Procalcitonin: 12.95 ng/mL

## 2010-09-09 LAB — LIPASE, BLOOD: Lipase: 22 U/L (ref 11–59)

## 2010-09-09 LAB — PHOSPHORUS: Phosphorus: 2.6 mg/dL (ref 2.3–4.6)

## 2010-09-09 LAB — PROTIME-INR
INR: 1.71 — ABNORMAL HIGH (ref 0.00–1.49)
Prothrombin Time: 20.4 seconds — ABNORMAL HIGH (ref 11.6–15.2)

## 2010-09-10 ENCOUNTER — Inpatient Hospital Stay (HOSPITAL_COMMUNITY): Payer: Medicaid Other

## 2010-09-10 DIAGNOSIS — R6521 Severe sepsis with septic shock: Secondary | ICD-10-CM

## 2010-09-10 DIAGNOSIS — N186 End stage renal disease: Secondary | ICD-10-CM

## 2010-09-10 DIAGNOSIS — A419 Sepsis, unspecified organism: Secondary | ICD-10-CM

## 2010-09-10 DIAGNOSIS — R6520 Severe sepsis without septic shock: Secondary | ICD-10-CM

## 2010-09-10 LAB — BLOOD GAS, ARTERIAL
Acid-Base Excess: 0.7 mmol/L (ref 0.0–2.0)
Bicarbonate: 25 mEq/L — ABNORMAL HIGH (ref 20.0–24.0)
Drawn by: 31101
FIO2: 0.21 %
O2 Saturation: 91.6 %
Patient temperature: 98.6
TCO2: 26.2 mmol/L (ref 0–100)
pCO2 arterial: 41.1 mmHg (ref 35.0–45.0)
pH, Arterial: 7.401 — ABNORMAL HIGH (ref 7.350–7.400)
pO2, Arterial: 59.4 mmHg — ABNORMAL LOW (ref 80.0–100.0)

## 2010-09-10 LAB — BASIC METABOLIC PANEL
BUN: 28 mg/dL — ABNORMAL HIGH (ref 6–23)
CO2: 26 mEq/L (ref 19–32)
Calcium: 7.4 mg/dL — ABNORMAL LOW (ref 8.4–10.5)
Chloride: 101 mEq/L (ref 96–112)
Creatinine, Ser: 6.09 mg/dL — ABNORMAL HIGH (ref 0.50–1.10)
GFR calc Af Amer: 9 mL/min — ABNORMAL LOW (ref >60)
GFR calc non Af Amer: 7 mL/min — ABNORMAL LOW (ref >60)
Glucose, Bld: 77 mg/dL (ref 70–99)
Potassium: 3.5 mEq/L (ref 3.5–5.1)
Sodium: 134 mEq/L — ABNORMAL LOW (ref 135–145)

## 2010-09-10 LAB — GLUCOSE, CAPILLARY
Glucose-Capillary: 73 mg/dL (ref 70–99)
Glucose-Capillary: 74 mg/dL (ref 70–99)
Glucose-Capillary: 76 mg/dL (ref 70–99)
Glucose-Capillary: 79 mg/dL (ref 70–99)
Glucose-Capillary: 84 mg/dL (ref 70–99)
Glucose-Capillary: 95 mg/dL (ref 70–99)

## 2010-09-10 LAB — HEPATITIS B SURFACE ANTIGEN: Hepatitis B Surface Ag: NEGATIVE

## 2010-09-10 LAB — CBC
HCT: 33.2 % — ABNORMAL LOW (ref 36.0–46.0)
Hemoglobin: 10.8 g/dL — ABNORMAL LOW (ref 12.0–15.0)
MCH: 30.2 pg (ref 26.0–34.0)
MCHC: 32.5 g/dL (ref 30.0–36.0)
MCV: 92.7 fL (ref 78.0–100.0)
Platelets: 50 10*3/uL — ABNORMAL LOW (ref 150–400)
RBC: 3.58 MIL/uL — ABNORMAL LOW (ref 3.87–5.11)
RDW: 17.5 % — ABNORMAL HIGH (ref 11.5–15.5)
WBC: 8 10*3/uL (ref 4.0–10.5)

## 2010-09-10 LAB — GIARDIA/CRYPTOSPORIDIUM SCREEN(EIA)
Cryptosporidium Screen (EIA): NEGATIVE
Giardia Screen - EIA: NEGATIVE

## 2010-09-10 LAB — PROCALCITONIN: Procalcitonin: 50.31 ng/mL

## 2010-09-10 LAB — CORTISOL: Cortisol, Plasma: 22.8 ug/dL

## 2010-09-11 ENCOUNTER — Inpatient Hospital Stay (HOSPITAL_COMMUNITY): Payer: Medicaid Other

## 2010-09-11 DIAGNOSIS — T827XXA Infection and inflammatory reaction due to other cardiac and vascular devices, implants and grafts, initial encounter: Secondary | ICD-10-CM

## 2010-09-11 LAB — COMPREHENSIVE METABOLIC PANEL
ALT: 22 U/L (ref 0–35)
AST: 54 U/L — ABNORMAL HIGH (ref 0–37)
Albumin: 1.4 g/dL — ABNORMAL LOW (ref 3.5–5.2)
Alkaline Phosphatase: 160 U/L — ABNORMAL HIGH (ref 39–117)
BUN: 17 mg/dL (ref 6–23)
CO2: 25 mEq/L (ref 19–32)
Calcium: 7.6 mg/dL — ABNORMAL LOW (ref 8.4–10.5)
Chloride: 100 mEq/L (ref 96–112)
Creatinine, Ser: 3.6 mg/dL — ABNORMAL HIGH (ref 0.50–1.10)
GFR calc Af Amer: 16 mL/min — ABNORMAL LOW (ref >60)
GFR calc non Af Amer: 13 mL/min — ABNORMAL LOW (ref >60)
Glucose, Bld: 99 mg/dL (ref 70–99)
Potassium: 4 mEq/L (ref 3.5–5.1)
Sodium: 134 mEq/L — ABNORMAL LOW (ref 135–145)
Total Bilirubin: 3.6 mg/dL — ABNORMAL HIGH (ref 0.3–1.2)
Total Protein: 8.1 g/dL (ref 6.0–8.3)

## 2010-09-11 LAB — GLUCOSE, CAPILLARY
Glucose-Capillary: 83 mg/dL (ref 70–99)
Glucose-Capillary: 82 mg/dL (ref 70–99)
Glucose-Capillary: 96 mg/dL (ref 70–99)
Glucose-Capillary: 101 mg/dL — ABNORMAL HIGH (ref 70–99)
Glucose-Capillary: 117 mg/dL — ABNORMAL HIGH (ref 70–99)

## 2010-09-11 LAB — CBC
HCT: 31.8 % — ABNORMAL LOW (ref 36.0–46.0)
Hemoglobin: 10.9 g/dL — ABNORMAL LOW (ref 12.0–15.0)
MCH: 31 pg (ref 26.0–34.0)
MCHC: 34.3 g/dL (ref 30.0–36.0)
MCV: 90.3 fL (ref 78.0–100.0)
Platelets: 77 10*3/uL — ABNORMAL LOW (ref 150–400)
RBC: 3.52 MIL/uL — ABNORMAL LOW (ref 3.87–5.11)
RDW: 17.8 % — ABNORMAL HIGH (ref 11.5–15.5)
WBC: 9 10*3/uL (ref 4.0–10.5)

## 2010-09-11 LAB — MAGNESIUM: Magnesium: 1.7 mg/dL (ref 1.5–2.5)

## 2010-09-11 LAB — PROTIME-INR
INR: 1.5 — ABNORMAL HIGH (ref 0.00–1.49)
Prothrombin Time: 18.4 seconds — ABNORMAL HIGH (ref 11.6–15.2)

## 2010-09-11 LAB — CARDIAC PANEL(CRET KIN+CKTOT+MB+TROPI)
CK, MB: 5.8 ng/mL — ABNORMAL HIGH (ref 0.3–4.0)
Relative Index: 3.9 — ABNORMAL HIGH (ref 0.0–2.5)
Total CK: 147 U/L (ref 7–177)
Troponin I: <0.3 ng/mL (ref <0.30)

## 2010-09-11 LAB — PHOSPHORUS: Phosphorus: 2.6 mg/dL (ref 2.3–4.6)

## 2010-09-11 LAB — APTT: aPTT: 44 seconds — ABNORMAL HIGH (ref 24–37)

## 2010-09-12 ENCOUNTER — Inpatient Hospital Stay (HOSPITAL_COMMUNITY): Payer: Medicaid Other

## 2010-09-12 ENCOUNTER — Encounter: Payer: Self-pay | Admitting: Internal Medicine

## 2010-09-12 DIAGNOSIS — R6521 Severe sepsis with septic shock: Secondary | ICD-10-CM

## 2010-09-12 DIAGNOSIS — B20 Human immunodeficiency virus [HIV] disease: Secondary | ICD-10-CM

## 2010-09-12 DIAGNOSIS — R6520 Severe sepsis without septic shock: Secondary | ICD-10-CM

## 2010-09-12 DIAGNOSIS — N186 End stage renal disease: Secondary | ICD-10-CM

## 2010-09-12 DIAGNOSIS — A419 Sepsis, unspecified organism: Secondary | ICD-10-CM

## 2010-09-12 LAB — CBC
HCT: 25.2 % — ABNORMAL LOW (ref 36.0–46.0)
HCT: 26 % — ABNORMAL LOW (ref 36.0–46.0)
Hemoglobin: 8.3 g/dL — ABNORMAL LOW (ref 12.0–15.0)
Hemoglobin: 8.7 g/dL — ABNORMAL LOW (ref 12.0–15.0)
MCH: 30 pg (ref 26.0–34.0)
MCH: 30.2 pg (ref 26.0–34.0)
MCHC: 32.9 g/dL (ref 30.0–36.0)
MCHC: 33.5 g/dL (ref 30.0–36.0)
MCV: 91 fL (ref 78.0–100.0)
MCV: 90.3 fL (ref 78.0–100.0)
Platelets: 89 10*3/uL — ABNORMAL LOW (ref 150–400)
Platelets: 86 10*3/uL — ABNORMAL LOW (ref 150–400)
RBC: 2.77 MIL/uL — ABNORMAL LOW (ref 3.87–5.11)
RBC: 2.88 MIL/uL — ABNORMAL LOW (ref 3.87–5.11)
RDW: 17.9 % — ABNORMAL HIGH (ref 11.5–15.5)
RDW: 18 % — ABNORMAL HIGH (ref 11.5–15.5)
WBC: 4.6 10*3/uL (ref 4.0–10.5)
WBC: 4.8 10*3/uL (ref 4.0–10.5)

## 2010-09-12 LAB — CULTURE, BLOOD (ROUTINE X 2)
Culture  Setup Time: 201209031335
Culture  Setup Time: 201209031335

## 2010-09-12 LAB — RENAL FUNCTION PANEL
Albumin: 1.3 g/dL — ABNORMAL LOW (ref 3.5–5.2)
BUN: 32 mg/dL — ABNORMAL HIGH (ref 6–23)
CO2: 27 mEq/L (ref 19–32)
Calcium: 7.1 mg/dL — ABNORMAL LOW (ref 8.4–10.5)
Chloride: 103 mEq/L (ref 96–112)
Creatinine, Ser: 4.19 mg/dL — ABNORMAL HIGH (ref 0.50–1.10)
GFR calc Af Amer: 13 mL/min — ABNORMAL LOW (ref >60)
GFR calc non Af Amer: 11 mL/min — ABNORMAL LOW (ref >60)
Glucose, Bld: 156 mg/dL — ABNORMAL HIGH (ref 70–99)
Phosphorus: 3.8 mg/dL (ref 2.3–4.6)
Potassium: 3.9 mEq/L (ref 3.5–5.1)
Sodium: 135 mEq/L (ref 135–145)

## 2010-09-12 LAB — GLUCOSE, CAPILLARY
Glucose-Capillary: 119 mg/dL — ABNORMAL HIGH (ref 70–99)
Glucose-Capillary: 133 mg/dL — ABNORMAL HIGH (ref 70–99)
Glucose-Capillary: 156 mg/dL — ABNORMAL HIGH (ref 70–99)
Glucose-Capillary: 197 mg/dL — ABNORMAL HIGH (ref 70–99)
Glucose-Capillary: 78 mg/dL (ref 70–99)

## 2010-09-12 LAB — PREPARE PLATELET PHERESIS: Unit division: 0

## 2010-09-12 NOTE — H&P (Signed)
Admission Date: 09/12/2010 Attending Pysician: Dr. Darel Hong  First Contact: Dr Blaine Hamper [phone]  Second contact: Dr Newt Lukes pager [phone]  [place], [place], or after 5pm Weekdays:  1st Contact: [phone] 2nd Contact: [phone]  PCP:   Chief Complaint: Tranfer to IMTS from ICU   HPI: This is 55 year old female with PMH significant for HIV ( noncompliance) , ESRD on HD( last 9/1), with multiple admission admits for sepsis in the past  who presented to the ED on 09/09/2010 with 3 day history of graft dite pain, redness and erythema and 2 day history of cramping abdominal pain and diarrhea. Patient presented to ER with temp of 104, tachycardic with SBP 60s. She remained hypotensive in spite of IVF resuscitation efforts. Code sepsis was called and patient was admitted to ICU.   Patient was started on Zosyn and Vancomycin which was changed to Avelox for MSSA and LUL PNA. C.diff negative. Cortisol level 24.  CT abd/pelvis no obvious abscess, retroperitoneal and groin lymphadenopathy presented. 9/5 s/p Removal of suspected infected AV graft, right upper arm with vein patch angioplasty of right brachial artery and primary repair of right axillary vein. Patient was tranferred on 09/06 to teaching service for further management. Patient noted that she had been feeling fine . Denies any complain.     Past Medical History  Diagnosis Date  . Human immunodeficiency virus (HIV) disease   . Hypertension   . ESRD (end stage renal disease)   . Dialysis patient     pt on dialysis since 2010.  [name] Clostridium difficile infection 04/04/10  . Bacteriuria, asymptomatic 04/04/10    Culture grew VRE sensitive to linesolid   . MGUS (monoclonal gammopathy of unknown significance)   . History of bacteremia     MSSA  . Renal insufficiency   Recurrent MRSA bacteremia History of group B. strep cellulitis HIV nephropathy requiring hemodialysis Cardiomyopathy with an EF of 20-30%  No past surgical history on  file.  Family History  Problem Relation Age of Onset  . Alcohol abuse      family h/o addiction/alcoholism  . Diabetes Cousin     first degree relatives   Social History:  reports that she has never smoked. She does not have any smokeless tobacco history on file. She reports that she does not drink alcohol or use illicit drugs.  Allergies: No Known Allergies  Medication: ( current in the hospital)  Atazanavir ZitheromycinAncef Pepcid Lamivudin Norvir Midodrine Zofran Zent Zeptra Vitamin B Tylenol Colace Ultram Ambien   Results for orders placed during the hospital encounter of 09/09/10 (from the past 48 hour(s))  GLUCOSE, CAPILLARY     Status: Normal   Collection Time   09/10/10  8:14 PM      Component Value Range Comment   Glucose-Capillary 84  70 - 99 (mg/dL)    Comment 1 Documented in Chart      Comment 2 Notify RN     GLUCOSE, CAPILLARY     Status: Normal   Collection Time   09/10/10 11:46 PM      Component Value Range Comment   Glucose-Capillary 83  70 - 99 (mg/dL)    Comment 1 Documented in Chart     GLUCOSE, CAPILLARY     Status: Normal   Collection Time   09/11/10  3:15 AM      Component Value Range Comment   Glucose-Capillary 82  70 - 99 (mg/dL)   CBC     Status: Abnormal   Collection  Time   09/11/10  4:30 AM      Component Value Range Comment   WBC 9.0  4.0 - 10.5 (K/uL)    RBC 3.52 (*) 3.87 - 5.11 (MIL/uL)    Hemoglobin 10.9 (*) 12.0 - 15.0 (g/dL)    HCT 31.8 (*) 36.0 - 46.0 (%)    MCV 90.3  78.0 - 100.0 (fL)    MCH 31.0  26.0 - 34.0 (pg)    MCHC 34.3  30.0 - 36.0 (g/dL)    RDW 17.8 (*) 11.5 - 15.5 (%)    Platelets 77 (*) 150 - 400 (K/uL) PLATELET COUNT CONFIRMED BY SMEAR  APTT     Status: Abnormal   Collection Time   09/11/10  4:30 AM      Component Value Range Comment   aPTT 44 (*) 24 - 37 (seconds)   PROTIME-INR     Status: Abnormal   Collection Time   09/11/10  4:30 AM      Component Value Range Comment   Prothrombin Time 18.4 (*) 11.6 - 15.2  (seconds)    INR 1.50 (*) 0.00 - 1.49    CARDIAC PANEL(CRET KIN+CKTOT+MB+TROPI)     Status: Abnormal   Collection Time   09/11/10  4:30 AM      Component Value Range Comment   Total CK 147  7 - 177 (U/L)    CK, MB 5.8 (*) 0.3 - 4.0 (ng/mL)    Troponin I <0.30  <0.30 (ng/mL)    Relative Index 3.9 (*) 0.0 - 2.5    COMPREHENSIVE METABOLIC PANEL     Status: Abnormal   Collection Time   09/11/10  4:30 AM      Component Value Range Comment   Sodium 134 (*) 135 - 145 (mEq/L)    Potassium 4.0  3.5 - 5.1 (mEq/L)    Chloride 100  96 - 112 (mEq/L)    CO2 25  19 - 32 (mEq/L)    Glucose, Bld 99  70 - 99 (mg/dL)    BUN 17  6 - 23 (mg/dL) DELTA CHECK NOTED   Creatinine, Ser 3.60 (*) 0.50 - 1.10 (mg/dL) ICTERUS AT THIS LEVEL MAY AFFECT RESULT   Calcium 7.6 (*) 8.4 - 10.5 (mg/dL)    Total Protein 8.1  6.0 - 8.3 (g/dL)    Albumin 1.4 (*) 3.5 - 5.2 (g/dL)    AST 54 (*) 0 - 37 (U/L)    ALT 22  0 - 35 (U/L)    Alkaline Phosphatase 160 (*) 39 - 117 (U/L)    Total Bilirubin 3.6 (*) 0.3 - 1.2 (mg/dL)    GFR calc non Af Amer 13 (*) >60 (mL/min)    GFR calc Af Amer 16 (*) >60 (mL/min)   MAGNESIUM     Status: Normal   Collection Time   09/11/10  4:30 AM      Component Value Range Comment   Magnesium 1.7  1.5 - 2.5 (mg/dL)   PHOSPHORUS     Status: Normal   Collection Time   09/11/10  4:30 AM      Component Value Range Comment   Phosphorus 2.6  2.3 - 4.6 (mg/dL)   GLUCOSE, CAPILLARY     Status: Normal   Collection Time   09/11/10  7:11 AM      Component Value Range Comment   Glucose-Capillary 96  70 - 99 (mg/dL)   WOUND CULTURE     Status: Normal (Preliminary result)   Collection Time  09/11/10 11:58 AM      Component Value Range Comment   Specimen Description WOUND RIGHT ARM GRAFT      Special Requests PT ON ZOSYN,VANCO      Gram Stain        Value: FEW WBC PRESENT, PREDOMINANTLY PMN     NO SQUAMOUS EPITHELIAL CELLS SEEN     NO ORGANISMS SEEN   Culture Culture reincubated for better growth       Report Status PENDING     GLUCOSE, CAPILLARY     Status: Abnormal   Collection Time   09/11/10  3:39 PM      Component Value Range Comment   Glucose-Capillary 101 (*) 70 - 99 (mg/dL)    Comment 1 Documented in Chart      Comment 2 Notify RN     GLUCOSE, CAPILLARY     Status: Abnormal   Collection Time   09/11/10  7:37 PM      Component Value Range Comment   Glucose-Capillary 117 (*) 70 - 99 (mg/dL)    Comment 1 Documented in Chart      Comment 2 Notify RN     GLUCOSE, CAPILLARY     Status: Abnormal   Collection Time   09/11/10 11:37 PM      Component Value Range Comment   Glucose-Capillary 197 (*) 70 - 99 (mg/dL)    Comment 1 Documented in Chart      Comment 2 Notify RN     GLUCOSE, CAPILLARY     Status: Abnormal   Collection Time   09/12/10  3:12 AM      Component Value Range Comment   Glucose-Capillary 156 (*) 70 - 99 (mg/dL)    Comment 1 Documented in Chart      Comment 2 Notify RN     CBC     Status: Abnormal   Collection Time   09/12/10  4:00 AM      Component Value Range Comment   WBC 4.6  4.0 - 10.5 (K/uL)    RBC 2.77 (*) 3.87 - 5.11 (MIL/uL)    Hemoglobin 8.3 (*) 12.0 - 15.0 (g/dL) DELTA CHECK NOTED   HCT 25.2 (*) 36.0 - 46.0 (%)    MCV 91.0  78.0 - 100.0 (fL)    MCH 30.0  26.0 - 34.0 (pg)    MCHC 32.9  30.0 - 36.0 (g/dL)    RDW 17.9 (*) 11.5 - 15.5 (%)    Platelets 89 (*) 150 - 400 (K/uL) CONSISTENT WITH PREVIOUS RESULT  RENAL FUNCTION PANEL     Status: Abnormal   Collection Time   09/12/10  4:00 AM      Component Value Range Comment   Sodium 135  135 - 145 (mEq/L)    Potassium 3.9  3.5 - 5.1 (mEq/L)    Chloride 103  96 - 112 (mEq/L)    CO2 27  19 - 32 (mEq/L)    Glucose, Bld 156 (*) 70 - 99 (mg/dL)    BUN 32 (*) 6 - 23 (mg/dL) DELTA CHECK NOTED   Creatinine, Ser 4.19 (*) 0.50 - 1.10 (mg/dL) ICTERUS AT THIS LEVEL MAY AFFECT RESULT   Calcium 7.1 (*) 8.4 - 10.5 (mg/dL)    Phosphorus 3.8  2.3 - 4.6 (mg/dL)    Albumin 1.3 (*) 3.5 - 5.2 (g/dL)    GFR calc non Af Amer  11 (*) >60 (mL/min)    GFR calc Af Amer 13 (*) >60 (mL/min)   GLUCOSE,  CAPILLARY     Status: Abnormal   Collection Time   09/12/10  7:41 AM      Component Value Range Comment   Glucose-Capillary 119 (*) 70 - 99 (mg/dL)   GLUCOSE, CAPILLARY     Status: Abnormal   Collection Time   09/12/10 10:58 AM      Component Value Range Comment   Glucose-Capillary 133 (*) 70 - 99 (mg/dL)   CBC     Status: Abnormal   Collection Time   09/12/10  4:00 PM      Component Value Range Comment   WBC 4.8  4.0 - 10.5 (K/uL)    RBC 2.88 (*) 3.87 - 5.11 (MIL/uL)    Hemoglobin 8.7 (*) 12.0 - 15.0 (g/dL)    HCT 26.0 (*) 36.0 - 46.0 (%)    MCV 90.3  78.0 - 100.0 (fL)    MCH 30.2  26.0 - 34.0 (pg)    MCHC 33.5  30.0 - 36.0 (g/dL)    RDW 18.0 (*) 11.5 - 15.5 (%)    Platelets 86 (*) 150 - 400 (K/uL) CONSISTENT WITH PREVIOUS RESULT   Dg Chest Portable 1 View  09/12/2010  *RADIOLOGY REPORT*  Clinical Data: Septic shock, shortness of breath  PORTABLE CHEST - 1 VIEW  Comparison: 09/11/2010  Findings: Right IJ central line tip in the proximal right atrium. Heart remains enlarged with central vascular congestion and left upper lobe consolidation.  No significant interval change.  No enlarging effusion or pneumothorax identified.  IMPRESSION: Persistent left upper lobe consolidation/pneumonia  Cardiomegaly and vascular congestion  Original Report Authenticated By: Jerilynn Mages. Daryll Brod, M.D.   Dg Chest Portable 1 View  09/11/2010  *RADIOLOGY REPORT*  Clinical Data: Respiratory distress, shortness of breath.  PORTABLE CHEST - 1 VIEW  Comparison: 09/10/2010  Findings: There is cardiomegaly.  Continued consolidation in the left upper lobe.  No focal opacity on the right.  Mild vascular congestion.  Right central line is unchanged.  IMPRESSION: Stable left upper lobe consolidation and cardiomegaly.  Original Report Authenticated By: Raelyn Number, M.D.   CXR 09/11/2010: Left upper lobe opacity, without significant change.  CT  abdomen/pelvis 09/03:  IMPRESSION:   Technically limited study due to lack of contrast material and   absent abdominal fat planes.  No obvious abdominal abscess is   demonstrated.  The liver is diffusely enlarged with diffuse fatty   infiltration.  There is apparent retroperitoneal lymphadenopathy.   Diffuse anasarca with edema throughout the subcutaneous fat.   Significant bilateral groin lymphadenopathy.  Small left pleural   effusion with basilar atelectasis.   Review of Systems  Constitutional: Positive for malaise/fatigue. Negative for chills.  Respiratory: Negative for shortness of breath.   Cardiovascular: Negative for chest pain.  Gastrointestinal: Negative for nausea, vomiting and abdominal pain.  Neurological: Positive for dizziness.    There were no vitals taken for this visit. Physical Exam  Constitutional: She is oriented to person, place, and time. She appears cachectic. She is cooperative.  Neck: Neck supple.    Cardiovascular: Normal rate, regular rhythm and normal heart sounds.   Respiratory: Effort normal. She has wheezes. She has rales.  GI: Soft. Bowel sounds are normal. She exhibits distension. There is no tenderness.  Musculoskeletal: Normal range of motion.       Significant chronic venous stasis changes present bilaterally.   Neurological: She is alert and oriented to person, place, and time. No cranial nerve deficit.  Skin: Skin is dry.  Assessment/Plan  1. Septic shock due to MSSA  resolved: Infected right upper extremities AV graved removed  on 09/05.  Patient currently still Hypotensive and needing of Midodrine. Furthermore started on Cefazolin for MSSA.   Blood cultures repeated on 9/6 . Per ID no permantent dialysis catheter in the setting of bacteremia.  Patient needs 6 weeks of IV antibiotic therapy. Furthermore if the TEE is recommended.  Appreciate ID recommendation further management.   2. Left upper lobe pneumonia; continue to Cefazolin  . Per ID recommendation may consider aminoglycoside per Health care-assoicated pneumonia protocol.  Appreciate ID recommendation further management.    3.HIV continue Antitretoviral therapy and prophylaxis therapy with Bactrim and Azithromycin. Last CD4 count 90 03/2010. Appreciate ID recommendation further management.   4. ESRD on HD:  9/5 s/p Removal of suspected infected AV graft, right upper arm with vein patch angioplasty of right brachial artery and primary repair of right axillary vein . New Diatek cath placement planned for 09/07. Appreciate renal and vascular surgery input in management of this patient.   5. Abdominal pain/ tenderness and diarrhea resolved. Unclear ethiology. C.diff negative. CT abd/pelvis no acute process. [name] Possible transient bowel ischemia in the setting of hypotension. Will continue to monitor.         Rosalia Hammers, MD [place], [place]   Attending Physician:  I have seen and examined the patient. I reviewed the resident note and agree with the findings and plan of care as documented. My additions and revisions are included.    Signature:___________________________________________   Rosalia Hammers 09/12/2010, 7:15 PM

## 2010-09-13 ENCOUNTER — Inpatient Hospital Stay (HOSPITAL_COMMUNITY): Payer: Medicaid Other

## 2010-09-13 DIAGNOSIS — N186 End stage renal disease: Secondary | ICD-10-CM

## 2010-09-13 LAB — BASIC METABOLIC PANEL
BUN: 41 mg/dL — ABNORMAL HIGH (ref 6–23)
CO2: 27 mEq/L (ref 19–32)
Calcium: 7.8 mg/dL — ABNORMAL LOW (ref 8.4–10.5)
Chloride: 103 mEq/L (ref 96–112)
Creatinine, Ser: 5.28 mg/dL — ABNORMAL HIGH (ref 0.50–1.10)
GFR calc Af Amer: 10 mL/min — ABNORMAL LOW (ref >60)
GFR calc non Af Amer: 9 mL/min — ABNORMAL LOW (ref >60)
Glucose, Bld: 81 mg/dL (ref 70–99)
Potassium: 3.3 mEq/L — ABNORMAL LOW (ref 3.5–5.1)
Sodium: 133 mEq/L — ABNORMAL LOW (ref 135–145)

## 2010-09-13 LAB — STOOL CULTURE

## 2010-09-13 LAB — CBC
HCT: 26.6 % — ABNORMAL LOW (ref 36.0–46.0)
Hemoglobin: 8.6 g/dL — ABNORMAL LOW (ref 12.0–15.0)
MCH: 29.5 pg (ref 26.0–34.0)
MCHC: 32.3 g/dL (ref 30.0–36.0)
MCV: 91.1 fL (ref 78.0–100.0)
Platelets: 95 10*3/uL — ABNORMAL LOW (ref 150–400)
RBC: 2.92 MIL/uL — ABNORMAL LOW (ref 3.87–5.11)
RDW: 17.8 % — ABNORMAL HIGH (ref 11.5–15.5)
WBC: 3.5 10*3/uL — ABNORMAL LOW (ref 4.0–10.5)

## 2010-09-13 LAB — GLUCOSE, CAPILLARY: Glucose-Capillary: 118 mg/dL — ABNORMAL HIGH (ref 70–99)

## 2010-09-14 ENCOUNTER — Inpatient Hospital Stay (HOSPITAL_COMMUNITY): Payer: Medicaid Other

## 2010-09-14 DIAGNOSIS — N186 End stage renal disease: Secondary | ICD-10-CM

## 2010-09-14 DIAGNOSIS — A419 Sepsis, unspecified organism: Secondary | ICD-10-CM

## 2010-09-14 DIAGNOSIS — B20 Human immunodeficiency virus [HIV] disease: Secondary | ICD-10-CM

## 2010-09-14 LAB — CBC
HCT: 27.7 % — ABNORMAL LOW (ref 36.0–46.0)
Hemoglobin: 9.1 g/dL — ABNORMAL LOW (ref 12.0–15.0)
MCH: 29.7 pg (ref 26.0–34.0)
MCHC: 32.9 g/dL (ref 30.0–36.0)
MCV: 90.5 fL (ref 78.0–100.0)
Platelets: 50 10*3/uL — ABNORMAL LOW (ref 150–400)
RBC: 3.06 MIL/uL — ABNORMAL LOW (ref 3.87–5.11)
RDW: 18.7 % — ABNORMAL HIGH (ref 11.5–15.5)
WBC: 2.2 10*3/uL — ABNORMAL LOW (ref 4.0–10.5)

## 2010-09-14 LAB — WOUND CULTURE

## 2010-09-14 LAB — CROSSMATCH
ABO/RH(D): O POS
Antibody Screen: NEGATIVE
Unit division: 0

## 2010-09-14 LAB — RENAL FUNCTION PANEL
Albumin: 1.3 g/dL — ABNORMAL LOW (ref 3.5–5.2)
BUN: 20 mg/dL (ref 6–23)
CO2: 30 mEq/L (ref 19–32)
Calcium: 7.7 mg/dL — ABNORMAL LOW (ref 8.4–10.5)
Chloride: 102 mEq/L (ref 96–112)
Creatinine, Ser: 3.35 mg/dL — ABNORMAL HIGH (ref 0.50–1.10)
GFR calc Af Amer: 17 mL/min — ABNORMAL LOW (ref >60)
GFR calc non Af Amer: 14 mL/min — ABNORMAL LOW (ref >60)
Glucose, Bld: 75 mg/dL (ref 70–99)
Phosphorus: 1.7 mg/dL — ABNORMAL LOW (ref 2.3–4.6)
Potassium: 3.6 mEq/L (ref 3.5–5.1)
Sodium: 135 mEq/L (ref 135–145)

## 2010-09-14 LAB — GLUCOSE, CAPILLARY: Glucose-Capillary: 75 mg/dL (ref 70–99)

## 2010-09-16 ENCOUNTER — Encounter: Payer: Self-pay | Admitting: Vascular Surgery

## 2010-09-16 DIAGNOSIS — R7881 Bacteremia: Secondary | ICD-10-CM

## 2010-09-16 DIAGNOSIS — B20 Human immunodeficiency virus [HIV] disease: Secondary | ICD-10-CM

## 2010-09-16 DIAGNOSIS — A4902 Methicillin resistant Staphylococcus aureus infection, unspecified site: Secondary | ICD-10-CM

## 2010-09-16 DIAGNOSIS — I359 Nonrheumatic aortic valve disorder, unspecified: Secondary | ICD-10-CM

## 2010-09-16 LAB — BASIC METABOLIC PANEL
BUN: 25 mg/dL — ABNORMAL HIGH (ref 6–23)
CO2: 30 mEq/L (ref 19–32)
Calcium: 8.1 mg/dL — ABNORMAL LOW (ref 8.4–10.5)
Chloride: 99 mEq/L (ref 96–112)
Creatinine, Ser: 4.23 mg/dL — ABNORMAL HIGH (ref 0.50–1.10)
GFR calc Af Amer: 13 mL/min — ABNORMAL LOW (ref >60)
GFR calc non Af Amer: 11 mL/min — ABNORMAL LOW (ref >60)
Glucose, Bld: 87 mg/dL (ref 70–99)
Potassium: 4.1 mEq/L (ref 3.5–5.1)
Sodium: 132 mEq/L — ABNORMAL LOW (ref 135–145)

## 2010-09-16 LAB — CBC
HCT: 27.3 % — ABNORMAL LOW (ref 36.0–46.0)
Hemoglobin: 9 g/dL — ABNORMAL LOW (ref 12.0–15.0)
MCH: 30.2 pg (ref 26.0–34.0)
MCHC: 33 g/dL (ref 30.0–36.0)
MCV: 91.6 fL (ref 78.0–100.0)
Platelets: 77 10*3/uL — ABNORMAL LOW (ref 150–400)
RBC: 2.98 MIL/uL — ABNORMAL LOW (ref 3.87–5.11)
RDW: 18.2 % — ABNORMAL HIGH (ref 11.5–15.5)
WBC: 3.2 10*3/uL — ABNORMAL LOW (ref 4.0–10.5)

## 2010-09-16 NOTE — Consult Note (Addendum)
NAMEPRENTICE, TARR NO.:  [id number]  MEDICAL RECORD NO.:  [id number]  LOCATION:  2102                         FACILITY:  [place]  PHYSICIAN:  Judeth Cornfield. Scot Dock, M.D.DATE OF BIRTH:  02-14-1955  DATE OF CONSULTATION: DATE OF DISCHARGE:                                CONSULTATION   CHIEF COMPLAINT:  Infected right upper arm graft.  HISTORY OF PRESENT ILLNESS:  Ms. Menear is a 55 year old woman with a history of end-stage renal disease, HIV, recurrent MRSA infections, hypertension, and cardiomyopathy who has a right upper arm AV Gore-Tex graft, which was thrombectomized and revised in May 2012.  She now was admitted with 3-day history of redness, swelling, and pain at the antecubital space in the right upper extremity over the graft.  On admission, she had a temperature of 104 degrees Fahrenheit.  She also had diarrhea and abdominal pain.  CT scan of the abdomen was negative for abscess.  Her blood cultures were positive for gram-negative cocci in clusters.  Her nasal swab MRSA was negative.  UA is pending.  C. diff was negative.  We are asked to evaluate the patient for possible graft removal.  The patient is presently on vancomycin and Zosyn.  At this point, the patient denies any pain in the hand.  She also denies any numbness or tingling in the right hand.  PAST MEDICAL HISTORY:  Significant for: 1. End-stage renal disease, on hemodialysis. 2. HIV/A. 3. Recurrent MRSA infections. 4. Hypertension. 5. Cardiomyopathy with an ejection fraction of 28%-30%. 6. Anemia of chronic disease. 7. Peripheral vascular disease status post right lower extremity     bypass. 8. Multiple access grafts in the left upper extremity with also     evacuation of a hematoma from the axilla on that side.  ALLERGIES:  SHE HAS NO KNOWN DRUG ALLERGIES.  MEDICATIONS:  Medications are as listed in history and physical.  SOCIAL HISTORY/FAMILY HISTORY/REVIEW OF SYSTEMS:  Were  reviewed in history and physical and there are no changes as above in HPI.  LABORATORY DATA: 1. Sodium was 134, potassium was 3.5, BUN 28, creatinine 6.09. 2. Hemoglobin/hematocrit 10.8 and 33.2.  Her platelet count was 50. 3. Blood cultures showed gram-positive cocci in clusters. 4. C diff negative. 5. MRSA, negative, nasal swab.  PHYSICAL EXAMINATION:  VITAL SIGNS:  Temperature is 96, blood pressure is 92/60, sats were 100% on 2 liters nasal cannula, heart rate is 73 and regular. GENERAL:  This is a thin African American female in no acute distress. She complained of some pain in the right arm.  She is disoriented at this time.  She is a poor historian. RESPIRATORY:  She was breathing comfortably with sats of 100% and no shortness of breath. RIGHT UPPER EXTREMITY:  Her hand was cold.  She had good capillary refill.  She had 2+ radial pulses, which are palpable. Good sensation and normal grip.  In the area of her antecubital space, there was an area of fluctuance, which is also erythematous, swollen, and painful. She had a good thrill and bruit in her upper arm AV graft. LEFT UPPER EXTREMITY:  She had 2+ radial pulses, which were palpable.  The hand was also cool, but had good sensation and motion.  ASSESSMENT/PLAN:  Infected right upper arm AV Gore-Tex graft.  Plan is to remove the graft secondary to infection with redness, swelling, and fluctuance over the graft.  In light of the patient's infection, it may be best to remove the graft and then possibly use a temporary catheter versus waiting a couple days before placing a Diatek catheter.  In light of infection, she will be continued on her Zosyn and vancomycin.  Dr. Scot Dock will assess the patient for timing of removal of graft.     Wray Kearns, PA-C   ______________________________ Judeth Cornfield. Scot Dock, M.D.    RR/MEDQ  D:  09/10/2010  T:  09/10/2010  Job:  [id number]  Electronically Signed by [name]  M.D. on 09/16/2010 08:23:51 AM Electronically Signed by Wray Kearns PA on 09/17/2010 11:09:16 AM

## 2010-09-17 ENCOUNTER — Ambulatory Visit: Payer: Medicaid Other | Admitting: Vascular Surgery

## 2010-09-17 ENCOUNTER — Inpatient Hospital Stay (HOSPITAL_COMMUNITY): Payer: Medicaid Other

## 2010-09-17 DIAGNOSIS — B20 Human immunodeficiency virus [HIV] disease: Secondary | ICD-10-CM

## 2010-09-17 DIAGNOSIS — A419 Sepsis, unspecified organism: Secondary | ICD-10-CM

## 2010-09-17 DIAGNOSIS — N186 End stage renal disease: Secondary | ICD-10-CM

## 2010-09-17 DIAGNOSIS — R7881 Bacteremia: Secondary | ICD-10-CM

## 2010-09-17 LAB — T-HELPER CELLS (CD4) COUNT (NOT AT ARMC)
CD4 % Helper T Cell: 19 % — ABNORMAL LOW (ref 33–55)
CD4 T Cell Abs: 270 uL — ABNORMAL LOW (ref 400–2700)

## 2010-09-17 LAB — CBC
HCT: 26.7 % — ABNORMAL LOW (ref 36.0–46.0)
Hemoglobin: 8.6 g/dL — ABNORMAL LOW (ref 12.0–15.0)
MCH: 29.6 pg (ref 26.0–34.0)
MCHC: 32.2 g/dL (ref 30.0–36.0)
MCV: 91.8 fL (ref 78.0–100.0)
Platelets: 120 10*3/uL — ABNORMAL LOW (ref 150–400)
RBC: 2.91 MIL/uL — ABNORMAL LOW (ref 3.87–5.11)
RDW: 17.7 % — ABNORMAL HIGH (ref 11.5–15.5)
WBC: 3.9 10*3/uL — ABNORMAL LOW (ref 4.0–10.5)

## 2010-09-17 LAB — RENAL FUNCTION PANEL
Albumin: 1.1 g/dL — ABNORMAL LOW (ref 3.5–5.2)
BUN: 34 mg/dL — ABNORMAL HIGH (ref 6–23)
CO2: 30 mEq/L (ref 19–32)
Calcium: 8.5 mg/dL (ref 8.4–10.5)
Chloride: 103 mEq/L (ref 96–112)
Creatinine, Ser: 5.29 mg/dL — ABNORMAL HIGH (ref 0.50–1.10)
GFR calc Af Amer: 10 mL/min — ABNORMAL LOW (ref >60)
GFR calc non Af Amer: 8 mL/min — ABNORMAL LOW (ref >60)
Glucose, Bld: 83 mg/dL (ref 70–99)
Phosphorus: 3.9 mg/dL (ref 2.3–4.6)
Potassium: 4.4 mEq/L (ref 3.5–5.1)
Sodium: 136 mEq/L (ref 135–145)

## 2010-09-17 LAB — HIV-1 RNA QUANT-NO REFLEX-BLD
HIV 1 RNA Quant: 99900 copies/mL — ABNORMAL HIGH (ref <20)
HIV-1 RNA Quant, Log: 5 {Log} — ABNORMAL HIGH (ref <1.30)

## 2010-09-18 DIAGNOSIS — A419 Sepsis, unspecified organism: Secondary | ICD-10-CM

## 2010-09-18 DIAGNOSIS — N186 End stage renal disease: Secondary | ICD-10-CM

## 2010-09-18 DIAGNOSIS — B20 Human immunodeficiency virus [HIV] disease: Secondary | ICD-10-CM

## 2010-09-18 LAB — HLA-B27 ANTIGEN

## 2010-09-19 ENCOUNTER — Inpatient Hospital Stay (HOSPITAL_COMMUNITY): Payer: Medicaid Other

## 2010-09-19 DIAGNOSIS — A419 Sepsis, unspecified organism: Secondary | ICD-10-CM

## 2010-09-19 DIAGNOSIS — R7881 Bacteremia: Secondary | ICD-10-CM

## 2010-09-19 DIAGNOSIS — N186 End stage renal disease: Secondary | ICD-10-CM

## 2010-09-19 DIAGNOSIS — B20 Human immunodeficiency virus [HIV] disease: Secondary | ICD-10-CM

## 2010-09-19 LAB — OCCULT BLOOD X 1 CARD TO LAB, STOOL: Fecal Occult Bld: NEGATIVE

## 2010-09-19 LAB — GLUCOSE, CAPILLARY: Glucose-Capillary: 91 mg/dL (ref 70–99)

## 2010-09-19 LAB — CBC
HCT: 27.4 % — ABNORMAL LOW (ref 36.0–46.0)
Hemoglobin: 8.9 g/dL — ABNORMAL LOW (ref 12.0–15.0)
MCH: 29.7 pg (ref 26.0–34.0)
MCHC: 32.5 g/dL (ref 30.0–36.0)
MCV: 91.3 fL (ref 78.0–100.0)
Platelets: 98 10*3/uL — ABNORMAL LOW (ref 150–400)
RBC: 3 MIL/uL — ABNORMAL LOW (ref 3.87–5.11)
RDW: 17.5 % — ABNORMAL HIGH (ref 11.5–15.5)
WBC: 4.2 10*3/uL (ref 4.0–10.5)

## 2010-09-19 LAB — RENAL FUNCTION PANEL
Albumin: 1.2 g/dL — ABNORMAL LOW (ref 3.5–5.2)
BUN: 23 mg/dL (ref 6–23)
CO2: 30 mEq/L (ref 19–32)
Calcium: 8.8 mg/dL (ref 8.4–10.5)
Chloride: 99 mEq/L (ref 96–112)
Creatinine, Ser: 4.48 mg/dL — ABNORMAL HIGH (ref 0.50–1.10)
GFR calc Af Amer: 12 mL/min — ABNORMAL LOW (ref >60)
GFR calc non Af Amer: 10 mL/min — ABNORMAL LOW (ref >60)
Glucose, Bld: 73 mg/dL (ref 70–99)
Phosphorus: 3.8 mg/dL (ref 2.3–4.6)
Potassium: 3.8 mEq/L (ref 3.5–5.1)
Sodium: 132 mEq/L — ABNORMAL LOW (ref 135–145)

## 2010-09-19 LAB — CULTURE, BLOOD (ROUTINE X 2)
Culture  Setup Time: 201209070155
Culture  Setup Time: 201209070155
Culture: NO GROWTH
Culture: NO GROWTH

## 2010-09-20 ENCOUNTER — Inpatient Hospital Stay (HOSPITAL_COMMUNITY): Payer: Medicaid Other

## 2010-09-20 DIAGNOSIS — A419 Sepsis, unspecified organism: Secondary | ICD-10-CM

## 2010-09-20 DIAGNOSIS — B20 Human immunodeficiency virus [HIV] disease: Secondary | ICD-10-CM

## 2010-09-20 DIAGNOSIS — N186 End stage renal disease: Secondary | ICD-10-CM

## 2010-09-21 ENCOUNTER — Inpatient Hospital Stay (HOSPITAL_COMMUNITY): Payer: Medicaid Other

## 2010-09-21 LAB — CBC
HCT: 25.2 % — ABNORMAL LOW (ref 36.0–46.0)
Hemoglobin: 8.4 g/dL — ABNORMAL LOW (ref 12.0–15.0)
MCH: 30.4 pg (ref 26.0–34.0)
MCHC: 33.3 g/dL (ref 30.0–36.0)
MCV: 91.3 fL (ref 78.0–100.0)
Platelets: 90 10*3/uL — ABNORMAL LOW (ref 150–400)
RBC: 2.76 MIL/uL — ABNORMAL LOW (ref 3.87–5.11)
RDW: 17.8 % — ABNORMAL HIGH (ref 11.5–15.5)
WBC: 3.5 10*3/uL — ABNORMAL LOW (ref 4.0–10.5)

## 2010-09-21 LAB — RENAL FUNCTION PANEL
Albumin: 1.6 g/dL — ABNORMAL LOW (ref 3.5–5.2)
BUN: 10 mg/dL (ref 6–23)
CO2: 31 mEq/L (ref 19–32)
Calcium: 8.6 mg/dL (ref 8.4–10.5)
Chloride: 101 mEq/L (ref 96–112)
Creatinine, Ser: 2.69 mg/dL — ABNORMAL HIGH (ref 0.50–1.10)
GFR calc Af Amer: 22 mL/min — ABNORMAL LOW (ref >60)
GFR calc non Af Amer: 18 mL/min — ABNORMAL LOW (ref >60)
Glucose, Bld: 61 mg/dL — ABNORMAL LOW (ref 70–99)
Phosphorus: 2.6 mg/dL (ref 2.3–4.6)
Potassium: 3.5 mEq/L (ref 3.5–5.1)
Sodium: 135 mEq/L (ref 135–145)

## 2010-09-23 DIAGNOSIS — A419 Sepsis, unspecified organism: Secondary | ICD-10-CM

## 2010-09-23 DIAGNOSIS — N186 End stage renal disease: Secondary | ICD-10-CM

## 2010-09-23 DIAGNOSIS — B20 Human immunodeficiency virus [HIV] disease: Secondary | ICD-10-CM

## 2010-09-23 LAB — CBC
HCT: 26.7 % — ABNORMAL LOW (ref 36.0–46.0)
Hemoglobin: 8.9 g/dL — ABNORMAL LOW (ref 12.0–15.0)
MCH: 30.8 pg (ref 26.0–34.0)
MCHC: 33.3 g/dL (ref 30.0–36.0)
MCV: 92.4 fL (ref 78.0–100.0)
Platelets: 118 10*3/uL — ABNORMAL LOW (ref 150–400)
RBC: 2.89 MIL/uL — ABNORMAL LOW (ref 3.87–5.11)
RDW: 18.6 % — ABNORMAL HIGH (ref 11.5–15.5)
WBC: 3.2 10*3/uL — ABNORMAL LOW (ref 4.0–10.5)

## 2010-09-23 LAB — BASIC METABOLIC PANEL
BUN: 13 mg/dL (ref 6–23)
CO2: 31 mEq/L (ref 19–32)
Calcium: 9.6 mg/dL (ref 8.4–10.5)
Chloride: 101 mEq/L (ref 96–112)
Creatinine, Ser: 3.67 mg/dL — ABNORMAL HIGH (ref 0.50–1.10)
GFR calc Af Amer: 16 mL/min — ABNORMAL LOW (ref >60)
GFR calc non Af Amer: 13 mL/min — ABNORMAL LOW (ref >60)
Glucose, Bld: 75 mg/dL (ref 70–99)
Potassium: 3.4 mEq/L — ABNORMAL LOW (ref 3.5–5.1)
Sodium: 136 mEq/L (ref 135–145)

## 2010-09-23 NOTE — Op Note (Signed)
  NAMEKATERYN, NIGHSWONGER NO.:  [id number]  MEDICAL RECORD NO.:  [id number]  LOCATION:  2102                         FACILITY:  [facility]  PHYSICIAN:  Nelda Severe. Kellie Simmering, M.D.  DATE OF BIRTH:  1956/01/06  DATE OF PROCEDURE:  09/11/2010 DATE OF DISCHARGE:                              OPERATIVE REPORT   PREOPERATIVE DIAGNOSES:  Sepsis secondary to suspected infection, right upper arm arteriovenous graft, end-stage renal disease.  POSTOPERATIVE DIAGNOSES:  Sepsis secondary to suspected infection, right upper arm arteriovenous graft, end-stage renal disease.  OPERATION:  Removal of suspected infected AV graft, right upper arm with vein patch angioplasty of right brachial artery and primary repair of right axillary vein.  SURGEON:  Nelda Severe. Kellie Simmering, MD  FIRST ASSISTANT:  Wray Kearns, PA-C  ANESTHESIA:  General endotracheal.  PROCEDURE:  The patient was taken to the operating room and placed in supine position, at which time, satisfactory general endotracheal anesthesia was administered.  Right upper extremity was prepped with Betadine scrub and solution and draped in a routine sterile manner. There were some diffuse erythema near the arterial anastomosis overlying the graft.  The patient had been hypotensive preoperatively and was maintained on pressors during the procedure with an adequate blood pressure.  Initially, a short longitudinal incision was made through the previous scar in the distal upper arm and the brachial artery was encircled proximally and distally with vessel loops.  A segment of brachial vein from this area was harvested to use as a vein patch.  No heparin was given.  Artery was occluded proximally and distally and the Gore-Tex was transected about 2 cm distal to the anastomosis.  The [facility] was excised from the artery and the vein was sewn into place with 6- 0 Prolene as a patch.  Following completion of this, there was good Doppler flow in  the radial artery at the wrist.  Incision was made in the axilla through the previous scar and the Gore-Tex to axillary vein anastomosis was dissected free.  It was an end-to-side anastomosis.  The proximal and distal control were obtained.  The graft was completely excised and the vein was repaired laterally with continuous 6-0 Prolene suture.  Two further incisions were made around the periphery of the graft, graft was dissected free.  There was no purulence noted, but there was a very slimy appearance of the graft and the few areas.  After completely excising the graft and achieving adequate hemostasis, the wounds were all closed in layers with interrupted 3-0 Vicryl and skin staples.  Sterile compression dressing was applied.  Cultures were sent including the graft to pathology and the patient was taken to the recovery room in stable condition.     Nelda Severe Kellie Simmering, M.D.     JDL/MEDQ  D:  09/11/2010  T:  09/11/2010  Job:  [id number]  Electronically Signed by Tinnie Gens M.D. on 09/23/2010 02:19:22 PM

## 2010-09-23 NOTE — Op Note (Signed)
  NAMEPUJA, LAFLIN                 ACCOUNT NO.:  [id number]  MEDICAL RECORD NO.:  [id number]  LOCATION:  [id number]                         FACILITY:  [facility]  PHYSICIAN:  Nelda Severe. Kellie Simmering, M.D.  DATE OF BIRTH:  May 24, 1955  DATE OF PROCEDURE:  09/13/2010 DATE OF DISCHARGE:                              OPERATIVE REPORT   PREOPERATIVE DIAGNOSIS:  End-stage renal disease.  POSTOPERATIVE DIAGNOSIS:  End-stage renal disease.  OPERATIONS: 1. Bilateral ultrasound localization of internal jugular veins. 2. Insertion Diatek catheter via left internal jugular vein (23 cm).  SURGEON:  Nelda Severe. Kellie Simmering, MD  FIRST ASSISTANT:  Nurse.  ANESTHESIA:  MAC.  DESCRIPTION OF PROCEDURE:  The patient was taken to the operating room, placed in the Trendelenburg position, upper chest and neck were exposed. Both internal jugular veins were imaged using B-mode ultrasound.  Both noted to be widely patent.  There was an IV in the right internal jugular vein in the mid neck.  Therefore, the left side was used preferentially.  After prepping and draping in routine sterile manner, the left internal jugular vein was entered using a supraclavicular approach, guidewire passed into the right atrium under fluoroscopic guidance.  After dilating the tract appropriately, the regular guidewire was exchanged for an Amplatz super stiff wire.  A 23 Diatek catheter was then passed through peel-away sheath over the wire and positioned in the right atrium.  The sheath was removed.  Catheter tunneled peripherally, secured with nylon sutures.  The wound closed with Vicryl in subcuticular fashion.  Sterile dressing was applied.  The patient taken to recovery in stable condition.     Nelda Severe Kellie Simmering, M.D.     JDL/MEDQ  D:  09/13/2010  T:  09/13/2010  Job:  [id number]  Electronically Signed by Tinnie Gens M.D. on 09/23/2010 02:19:27 PM

## 2010-09-24 ENCOUNTER — Inpatient Hospital Stay (HOSPITAL_COMMUNITY): Payer: Medicaid Other

## 2010-09-24 DIAGNOSIS — B20 Human immunodeficiency virus [HIV] disease: Secondary | ICD-10-CM

## 2010-09-24 DIAGNOSIS — A419 Sepsis, unspecified organism: Secondary | ICD-10-CM

## 2010-09-24 DIAGNOSIS — N186 End stage renal disease: Secondary | ICD-10-CM

## 2010-09-24 LAB — RENAL FUNCTION PANEL
Albumin: 1.6 g/dL — ABNORMAL LOW (ref 3.5–5.2)
BUN: 21 mg/dL (ref 6–23)
CO2: 31 mEq/L (ref 19–32)
Calcium: 9.8 mg/dL (ref 8.4–10.5)
Chloride: 102 mEq/L (ref 96–112)
Creatinine, Ser: 4.32 mg/dL — ABNORMAL HIGH (ref 0.50–1.10)
GFR calc Af Amer: 13 mL/min — ABNORMAL LOW (ref >60)
GFR calc non Af Amer: 11 mL/min — ABNORMAL LOW (ref >60)
Glucose, Bld: 76 mg/dL (ref 70–99)
Phosphorus: 3.5 mg/dL (ref 2.3–4.6)
Potassium: 4.4 mEq/L (ref 3.5–5.1)
Sodium: 136 mEq/L (ref 135–145)

## 2010-09-24 LAB — CBC
HCT: 25.5 % — ABNORMAL LOW (ref 36.0–46.0)
Hemoglobin: 8.1 g/dL — ABNORMAL LOW (ref 12.0–15.0)
MCH: 30.1 pg (ref 26.0–34.0)
MCHC: 31.8 g/dL (ref 30.0–36.0)
MCV: 94.8 fL (ref 78.0–100.0)
Platelets: 141 10*3/uL — ABNORMAL LOW (ref 150–400)
RBC: 2.69 MIL/uL — ABNORMAL LOW (ref 3.87–5.11)
RDW: 19 % — ABNORMAL HIGH (ref 11.5–15.5)
WBC: 3.8 10*3/uL — ABNORMAL LOW (ref 4.0–10.5)

## 2010-09-24 LAB — IRON AND TIBC
Iron: 37 ug/dL — ABNORMAL LOW (ref 42–135)
UIBC: <55 ug/dL — ABNORMAL LOW (ref 125–400)

## 2010-09-25 LAB — BASIC METABOLIC PANEL
BUN: 15 mg/dL (ref 6–23)
CO2: 31 mEq/L (ref 19–32)
Calcium: 9.1 mg/dL (ref 8.4–10.5)
Chloride: 102 mEq/L (ref 96–112)
Creatinine, Ser: 3.14 mg/dL — ABNORMAL HIGH (ref 0.50–1.10)
GFR calc Af Amer: 19 mL/min — ABNORMAL LOW (ref >60)
GFR calc non Af Amer: 15 mL/min — ABNORMAL LOW (ref >60)
Glucose, Bld: 77 mg/dL (ref 70–99)
Potassium: 4.3 mEq/L (ref 3.5–5.1)
Sodium: 136 mEq/L (ref 135–145)

## 2010-09-26 ENCOUNTER — Inpatient Hospital Stay (HOSPITAL_COMMUNITY): Payer: Medicaid Other

## 2010-09-26 DIAGNOSIS — A419 Sepsis, unspecified organism: Secondary | ICD-10-CM

## 2010-09-26 DIAGNOSIS — B20 Human immunodeficiency virus [HIV] disease: Secondary | ICD-10-CM

## 2010-09-26 DIAGNOSIS — N186 End stage renal disease: Secondary | ICD-10-CM

## 2010-09-26 LAB — HIV-1 GENOTYPR PLUS

## 2010-09-27 ENCOUNTER — Inpatient Hospital Stay (HOSPITAL_COMMUNITY): Payer: Medicaid Other

## 2010-09-27 DIAGNOSIS — A419 Sepsis, unspecified organism: Secondary | ICD-10-CM

## 2010-09-27 DIAGNOSIS — N186 End stage renal disease: Secondary | ICD-10-CM

## 2010-09-27 DIAGNOSIS — B20 Human immunodeficiency virus [HIV] disease: Secondary | ICD-10-CM

## 2010-09-28 LAB — CBC
HCT: 24.5 % — ABNORMAL LOW (ref 36.0–46.0)
Hemoglobin: 7.9 g/dL — ABNORMAL LOW (ref 12.0–15.0)
MCH: 31.3 pg (ref 26.0–34.0)
MCHC: 32.2 g/dL (ref 30.0–36.0)
MCV: 97.2 fL (ref 78.0–100.0)
Platelets: 131 10*3/uL — ABNORMAL LOW (ref 150–400)
RBC: 2.52 MIL/uL — ABNORMAL LOW (ref 3.87–5.11)
RDW: 21.5 % — ABNORMAL HIGH (ref 11.5–15.5)
WBC: 2.6 10*3/uL — ABNORMAL LOW (ref 4.0–10.5)

## 2010-09-28 LAB — BASIC METABOLIC PANEL
BUN: 11 mg/dL (ref 6–23)
CO2: 31 mEq/L (ref 19–32)
Calcium: 8.6 mg/dL (ref 8.4–10.5)
Chloride: 99 mEq/L (ref 96–112)
Creatinine, Ser: 2.3 mg/dL — ABNORMAL HIGH (ref 0.50–1.10)
GFR calc Af Amer: 27 mL/min — ABNORMAL LOW (ref >60)
GFR calc non Af Amer: 22 mL/min — ABNORMAL LOW (ref >60)
Glucose, Bld: 76 mg/dL (ref 70–99)
Potassium: 3.2 mEq/L — ABNORMAL LOW (ref 3.5–5.1)
Sodium: 135 mEq/L (ref 135–145)

## 2010-09-29 NOTE — Consult Note (Signed)
[name], WADDING NO.:  [id number]  MEDICAL RECORD NO.:  [id number]  LOCATION:  2102                         FACILITY:  [facility]  PHYSICIAN:  Alcide Evener, MD  DATE OF BIRTH:  1955-10-08  DATE OF CONSULTATION:  09/12/2010 DATE OF DISCHARGE:                                CONSULTATION   REQUESTING PHYSICIAN:  Collene Gobble, MD, Critical Care Medicine.  REASON FOR INFECTIOUS DISEASE CONSULTATION:  The patient with staph aureus bacteremia and graft infection, also with HIV.  HISTORY OF PRESENT ILLNESS:  [name] is a 55 year old African American female known to me from prior admissions to the hospital who had HIV and AIDS and is terribly noncompliant with her antiretroviral therapy.  She has been on hemodialysis via graft in her upper arm and has had history of recurrent staph aureus bacteremias and was most recently seen by me in March 2012, at which point in time she was found to have a group B strep bacteremia and had C. diff colitis.  The patient was admitted to the Intensive Care Unit on September 09, 2010 with complaints of pain at her graft site, erythema, redness, and 2 days of abdominal cramping, abdominal pain, and diarrhea.  In the emergency department, she was febrile to 104, tachycardic, and hypotensive.  She was resuscitated with fluids in the ER, blood culture obtained, and she was placed on broad- spectrum antibiotics.  In the interim, she has grown methicillin- sensitive staph aureus from blood and she has been found to have an infected right upper AV graft that was removed by Dr. Kellie Simmering on September 11, 2010.  She has been covered with vancomycin and Zosyn initially for sepsis protocol.  She does have a left upper lobe infiltrate on chest x-ray as well concerning for possible pneumonia. She has improved and has improved clinically and has plans to place a new dialysis catheter for access, although we have not yet seen clearing of her  blood cultures.  I have been asked to assess in the management and work up of this patient with methicillin-sensitive staph aureus bacteremia in the setting of a graft infection.  PAST MEDICAL HISTORY: 1. HIV and AIDS, noncompliant with antiretroviral therapy. 2. History of recurrent methicillin-sensitive staph aureus     bacteremias, now found to have a graft infection. 3. History of group B streptococcal bacteremia. 4. History of C. diff colitis. 5. Cardiomyopathy with ejection fraction of 20-30%. 6. Anemia of chronic disease. 7. Peripheral vascular disease status post right lower extremity     bypass. 8. History of multiple access grafts that have failed.  PAST SURGICAL HISTORY:  As described above.  ALLERGIES:  No known drug allergies.  MEDICATIONS:  She does not know her outpatient medications at all and she has actually failed to be seen in the ID Clinic for quite some time. Currently, as an inpatient she is on: 1. Atazanavir 300 mg daily. 2. Pepcid. 3. Lamivudine 25 mg daily. 4. Midodrine 10 mg daily. 5. Moxifloxacin 4 mg IV daily. 6. Zofran. 7. Rena-Vite. 8. Norvir 100 mg daily. 9. Zerit 20 mg at bedtime. 10.Bactrim 1 tablet at bedtime. 11.Vitamin B complex.  12.Tylenol. 13.Calcium carbonate. 14.Docusate. 15.Hydroxyzine. 16.Morphine sulfate. 17.Levophed. 18.Phenergan. 19.Tramadol. 20.Ambien.  PHYSICAL EXAMINATION:  VITAL SIGNS:  Temperature maximum last 24 hours is 98.5, temperature current 97.3.  Blood pressure is 89/51, pulse is 62, respirations 17, and pulse ox is 97% on room air. GENERAL:  The patient is alert, oriented, and in no acute distress. HEENT:  Normocephalic and atraumatic.  Pupils equal, round, and reactive to light.  Sclerae anicteric.  Oropharynx clear. NECK:  With IJ on the right side placed yesterday. CARDIOVASCULAR:  Tachycardic.  No murmurs, gallops, or rubs heard. LUNGS:  With diminished breath sounds on left side. ABDOMEN:  Soft,  nondistended, and nontender. EXTREMITIES:  Lower extremities with 2+ edema.  Right upper extremity is wrapped.  LABORATORY DATA:  Sodium 135, potassium 3.9, chloride 103, bicarb 27, BUN and creatinine 32 and 4.19, glucose 156, and albumin 1.3.  CBC differential:  White count 4.6, hemoglobin 8.3, and platelets 89.  MICROBIOLOGICAL CULTURE DATA:  Wound culture on September 11, 2010 from the right arm graft, few white blood cells seen, predominantly PMNs, no organisms seen yet.  Blood cultures from September 09, 2010, methicillin- sensitive staph aureus in 2/2 blood cultures, resistant to clindamycin, erythromycin, sensitive to gentamicin, levofloxacin, oxacillin, resistant to penicillin, sensitive to rifampin, Bactrim, vancomycin, and tetracycline.  IMPRESSION/RECOMMENDATIONS:  This is a 55 year old African American lady with methicillin staph aureus bacteremia associated with graft infection, also with possible pneumonia and human immunodeficiency virus and acquired immune deficiency syndrome. 1. Methicillin-sensitive staph aureus bacteremia in the setting of     graft infection.  I certainly agree with aggressive care and the     removal of her graft.  I would keep the patient back on parenteral     antibiotic.  Since this is a methicillin-sensitive staph aureus, I     will put her on cefazolin which can be dosed based on her renal     function.  I would recheck her blood cultures today and make sure     she is indeed cleared her blood.  Certainly, I would not want to     put a permanent dialysis catheter in the setting of bacteremia.     She is very likely cleared but I would like to document this with     blood cultures today proving she has cleared cultures.  She is     going to need a protracted course of antibiotics and I would treat     her for at least 6 weeks with IV antibiotics.  I would also     evaluate her heart valves with a transesophageal echocardiogram. 2. Left upper  lobe pneumonia:  Certainly, I think changing cefazolin     is not unreasonable for now and see how she does.  If she     deteriorate, I would then add back in anti-Pseudomonal coverage and     vancomycin given that she is a dialysis patient.  I would also     consider adding an aminoglycoside per the healthcare-associated     pneumonia protocol. 3. Human immunodeficiency virus and acquired immune deficiency     syndrome.  The patient currently appears to be on a regimen of     stavudine, lamivudine, and Norvir-boosted atazanavir.  She has been     highly noncompliant with this regimen.  I need to double check if     this is in fact the regimen that she was on in the [facility].  I  cannot find much in the way of recent notes on her.  She has been     highly noncompliant.  Her viral loads have been sky high and her CD-     4 counts have been low.  It would be helpful prior to discharge to     engage her with one of the bridge counselors of [employer] such as [employer] 4. Opportunistic infection prophylaxis.  Bactrim is reasonable to     continue and she also was in the need of weekly azithromycin based     on her CD-4 count.  Thank you for this Infectious Disease consultation.     Alcide Evener, MD     CV/MEDQ  D:  09/12/2010  T:  09/12/2010  Job:  [id number]  cc:   Liam Graham, MD Sol Blazing, M.D.  Electronically Signed by Rhina Brackett DAM MD on 09/29/2010 10:07:23 PM

## 2010-09-30 LAB — PROTEIN ELECTROPH W RFLX QUANT IMMUNOGLOBULINS
Albumin ELP: 25.2 — ABNORMAL LOW
Alpha-1-Globulin: 4.4
Alpha-2-Globulin: 8.8
Beta 2: 7.9 — ABNORMAL HIGH
Beta Globulin: 6
Gamma Globulin: 47.7 — ABNORMAL HIGH
M-Spike, %: NOT DETECTED
Total Protein ELP: 7.8

## 2010-09-30 LAB — CBC
HCT: 28 % — ABNORMAL LOW (ref 36.0–46.0)
HCT: 25 — ABNORMAL LOW
HCT: 26.2 — ABNORMAL LOW
HCT: 26.8 — ABNORMAL LOW
HCT: 27.2 — ABNORMAL LOW
HCT: 27.6 — ABNORMAL LOW
HCT: 27.9 — ABNORMAL LOW
Hemoglobin: 9 g/dL — ABNORMAL LOW (ref 12.0–15.0)
Hemoglobin: 8.6 — ABNORMAL LOW
Hemoglobin: 9 — ABNORMAL LOW
Hemoglobin: 9.3 — ABNORMAL LOW
Hemoglobin: 9.5 — ABNORMAL LOW
Hemoglobin: 9.5 — ABNORMAL LOW
Hemoglobin: 9.6 — ABNORMAL LOW
MCH: 31.9 pg (ref 26.0–34.0)
MCHC: 32.1 g/dL (ref 30.0–36.0)
MCHC: 34.5
MCHC: 34.2
MCHC: 34.5
MCHC: 34.6
MCHC: 34.7
MCHC: 34.8
MCV: 99.3 fL (ref 78.0–100.0)
MCV: 93.6
MCV: 92.2
MCV: 92.5
MCV: 92.6
MCV: 92.7
MCV: 93.1
Platelets: 151 10*3/uL (ref 150–400)
Platelets: 267
Platelets: 255
Platelets: 266
Platelets: 299
Platelets: 316
Platelets: 351
RBC: 2.82 MIL/uL — ABNORMAL LOW (ref 3.87–5.11)
RBC: 2.67 — ABNORMAL LOW
RBC: 2.84 — ABNORMAL LOW
RBC: 2.91 — ABNORMAL LOW
RBC: 2.94 — ABNORMAL LOW
RBC: 2.97 — ABNORMAL LOW
RBC: 2.99 — ABNORMAL LOW
RDW: 23.9 % — ABNORMAL HIGH (ref 11.5–15.5)
RDW: 13.5
RDW: 13.9
RDW: 14.4
RDW: 14.4
RDW: 14.7
RDW: 14.8
WBC: 3.3 10*3/uL — ABNORMAL LOW (ref 4.0–10.5)
WBC: 7.4
WBC: 2.7 — ABNORMAL LOW
WBC: 2.9 — ABNORMAL LOW
WBC: 3.4 — ABNORMAL LOW
WBC: 3.7 — ABNORMAL LOW
WBC: 5.4

## 2010-09-30 LAB — BASIC METABOLIC PANEL
BUN: 28 — ABNORMAL HIGH
BUN: 25 — ABNORMAL HIGH
BUN: 25 — ABNORMAL HIGH
BUN: 26 — ABNORMAL HIGH
BUN: 28 — ABNORMAL HIGH
BUN: 32 — ABNORMAL HIGH
CO2: 25
CO2: 21
CO2: 21
CO2: 23
CO2: 24
CO2: 25
Calcium: 7.8 — ABNORMAL LOW
Calcium: 8 — ABNORMAL LOW
Calcium: 8.2 — ABNORMAL LOW
Calcium: 8.4
Calcium: 8.6
Calcium: 8.8
Chloride: 104
Chloride: 105
Chloride: 107
Chloride: 108
Chloride: 108
Chloride: 109
Creatinine, Ser: 3.31 — ABNORMAL HIGH
Creatinine, Ser: 2.94 — ABNORMAL HIGH
Creatinine, Ser: 3.01 — ABNORMAL HIGH
Creatinine, Ser: 3.06 — ABNORMAL HIGH
Creatinine, Ser: 3.25 — ABNORMAL HIGH
Creatinine, Ser: 3.45 — ABNORMAL HIGH
GFR calc Af Amer: 18 — ABNORMAL LOW
GFR calc Af Amer: 17 — ABNORMAL LOW
GFR calc Af Amer: 18 — ABNORMAL LOW
GFR calc Af Amer: 20 — ABNORMAL LOW
GFR calc Af Amer: 20 — ABNORMAL LOW
GFR calc Af Amer: 20 — ABNORMAL LOW
GFR calc non Af Amer: 15 — ABNORMAL LOW
GFR calc non Af Amer: 14 — ABNORMAL LOW
GFR calc non Af Amer: 15 — ABNORMAL LOW
GFR calc non Af Amer: 16 — ABNORMAL LOW
GFR calc non Af Amer: 16 — ABNORMAL LOW
GFR calc non Af Amer: 17 — ABNORMAL LOW
Glucose, Bld: 118 — ABNORMAL HIGH
Glucose, Bld: 74
Glucose, Bld: 80
Glucose, Bld: 87
Glucose, Bld: 88
Glucose, Bld: 97
Potassium: 3.2 — ABNORMAL LOW
Potassium: 3.3 — ABNORMAL LOW
Potassium: 3.7
Potassium: 3.7
Potassium: 3.8
Potassium: 4.1
Sodium: 132 — ABNORMAL LOW
Sodium: 133 — ABNORMAL LOW
Sodium: 134 — ABNORMAL LOW
Sodium: 134 — ABNORMAL LOW
Sodium: 135
Sodium: 137

## 2010-09-30 LAB — DIFFERENTIAL
Basophils Absolute: 0
Basophils Relative: 0
Eosinophils Absolute: 0
Eosinophils Relative: 0
Lymphocytes Relative: 17
Lymphs Abs: 1.2
Monocytes Absolute: 0.5
Monocytes Relative: 7
Neutro Abs: 5.6
Neutrophils Relative %: 76

## 2010-09-30 LAB — COMPREHENSIVE METABOLIC PANEL
ALT: 39 — ABNORMAL HIGH
AST: 67 — ABNORMAL HIGH
Albumin: 1.7 — ABNORMAL LOW
Alkaline Phosphatase: 75
BUN: 32 — ABNORMAL HIGH
CO2: 25
Calcium: 8 — ABNORMAL LOW
Chloride: 101
Creatinine, Ser: 3.66 — ABNORMAL HIGH
GFR calc Af Amer: 16 — ABNORMAL LOW
GFR calc non Af Amer: 13 — ABNORMAL LOW
Glucose, Bld: 137 — ABNORMAL HIGH
Potassium: 3.8
Sodium: 132 — ABNORMAL LOW
Total Bilirubin: 2 — ABNORMAL HIGH
Total Protein: 9.5 — ABNORMAL HIGH

## 2010-09-30 LAB — ABO/RH: ABO/RH(D): O POS

## 2010-09-30 LAB — URINALYSIS, ROUTINE W REFLEX MICROSCOPIC
Bilirubin Urine: NEGATIVE
Bilirubin Urine: NEGATIVE
Glucose, UA: NEGATIVE
Glucose, UA: NEGATIVE
Ketones, ur: NEGATIVE
Ketones, ur: NEGATIVE
Leukocytes, UA: NEGATIVE
Nitrite: NEGATIVE
Nitrite: NEGATIVE
Protein, ur: >300 — AB
Protein, ur: >300 — AB
Specific Gravity, Urine: 1.012
Specific Gravity, Urine: 1.014
Urobilinogen, UA: 0.2
Urobilinogen, UA: 0.2
pH: 6
pH: 6

## 2010-09-30 LAB — RENAL FUNCTION PANEL
Albumin: 1.6 g/dL — ABNORMAL LOW (ref 3.5–5.2)
Albumin: 1.7 — ABNORMAL LOW
Albumin: 1.7 — ABNORMAL LOW
BUN: 27 mg/dL — ABNORMAL HIGH (ref 6–23)
BUN: 25 — ABNORMAL HIGH
BUN: 27 — ABNORMAL HIGH
CO2: 29 mEq/L (ref 19–32)
CO2: 20
CO2: 23
Calcium: 8.7 mg/dL (ref 8.4–10.5)
Calcium: 8.5
Calcium: 8.7
Chloride: 101 mEq/L (ref 96–112)
Chloride: 107
Chloride: 107
Creatinine, Ser: 4.57 mg/dL — ABNORMAL HIGH (ref 0.50–1.10)
Creatinine, Ser: 3.06 — ABNORMAL HIGH
Creatinine, Ser: 3.22 — ABNORMAL HIGH
GFR calc Af Amer: 12 mL/min — ABNORMAL LOW (ref >60)
GFR calc Af Amer: 18 — ABNORMAL LOW
GFR calc Af Amer: 20 — ABNORMAL LOW
GFR calc non Af Amer: 10 mL/min — ABNORMAL LOW (ref >60)
GFR calc non Af Amer: 15 — ABNORMAL LOW
GFR calc non Af Amer: 16 — ABNORMAL LOW
Glucose, Bld: 95 mg/dL (ref 70–99)
Glucose, Bld: 81
Glucose, Bld: 86
Phosphorus: 3 mg/dL (ref 2.3–4.6)
Phosphorus: 3.7
Phosphorus: 4.3
Potassium: 4 mEq/L (ref 3.5–5.1)
Potassium: 3.6
Potassium: 4
Sodium: 135 mEq/L (ref 135–145)
Sodium: 134 — ABNORMAL LOW
Sodium: 134 — ABNORMAL LOW

## 2010-09-30 LAB — IGG, IGA, IGM
IgA: 293
IgG (Immunoglobin G), Serum: 4100 — ABNORMAL HIGH
IgM, Serum: 454 — ABNORMAL HIGH

## 2010-09-30 LAB — URINE MICROSCOPIC-ADD ON

## 2010-09-30 LAB — IRON AND TIBC
Iron: 12 — ABNORMAL LOW
Iron: 284 — ABNORMAL HIGH
Saturation Ratios: 7 — ABNORMAL LOW
TIBC: 179 — ABNORMAL LOW
UIBC: 167
UIBC: <55

## 2010-09-30 LAB — CROSSMATCH
ABO/RH(D): O POS
Antibody Screen: NEGATIVE

## 2010-09-30 LAB — PTH, INTACT AND CALCIUM
Calcium, Total (PTH): 8.2 — ABNORMAL LOW
Calcium, Total (PTH): 8.3 — ABNORMAL LOW
PTH: 80 — ABNORMAL HIGH
PTH: 99.3 — ABNORMAL HIGH

## 2010-09-30 LAB — SEDIMENTATION RATE: Sed Rate: 101 — ABNORMAL HIGH

## 2010-09-30 LAB — PROTEIN, URINE, 24 HOUR
Collection Interval-UPROT: 24
Protein, Urine: 418

## 2010-09-30 LAB — GLOMERULAR BASEMENT MEMBRANE ANTIBODIES: GBM Ab: 1 AU/mL (ref 0–19)

## 2010-09-30 LAB — LIPID PANEL
Cholesterol: 151
HDL: <10 — ABNORMAL LOW
Triglycerides: 102
VLDL: 20

## 2010-09-30 LAB — FERRITIN
Ferritin: 106 (ref 10–291)
Ferritin: 401 — ABNORMAL HIGH (ref 10–291)

## 2010-09-30 LAB — HIV-1 RNA ULTRAQUANT REFLEX TO GENTYP+
HIV 1 RNA Quant: 17500 copies/mL — ABNORMAL HIGH (ref <50)
HIV-1 RNA Quant, Log: 4.24 — ABNORMAL HIGH (ref <1.70)

## 2010-09-30 LAB — B-NATRIURETIC PEPTIDE (CONVERTED LAB)
Pro B Natriuretic peptide (BNP): 248 — ABNORMAL HIGH
Pro B Natriuretic peptide (BNP): 444 — ABNORMAL HIGH

## 2010-09-30 LAB — C3 COMPLEMENT: C3 Complement: 86 — ABNORMAL LOW

## 2010-09-30 LAB — HEPATITIS B SURFACE ANTIGEN: Hepatitis B Surface Ag: NEGATIVE

## 2010-09-30 LAB — T-HELPER CELLS (CD4) COUNT (NOT AT ARMC)
CD4 % Helper T Cell: 28 — ABNORMAL LOW
CD4 T Cell Abs: 310 — ABNORMAL LOW

## 2010-09-30 LAB — HIV ANTIBODY (ROUTINE TESTING W REFLEX): HIV: REACTIVE — AB

## 2010-09-30 LAB — APTT: aPTT: 30

## 2010-09-30 LAB — HEPATITIS C ANTIBODY: HCV Ab: NEGATIVE

## 2010-09-30 LAB — RETICULOCYTES
RBC.: 2.15 — ABNORMAL LOW
Retic Count, Absolute: 36.6
Retic Ct Pct: 1.7

## 2010-09-30 LAB — HEMOGLOBIN AND HEMATOCRIT, BLOOD
HCT: 21.7 — ABNORMAL LOW
HCT: 30.9 — ABNORMAL LOW
Hemoglobin: 10.6 — ABNORMAL LOW
Hemoglobin: 7.5 — CL

## 2010-09-30 LAB — ANGIOTENSIN CONVERTING ENZYME: Angiotensin-Converting Enzyme: 110 U/L — ABNORMAL HIGH (ref 9–67)

## 2010-09-30 LAB — ANA: Anti Nuclear Antibody(ANA): NEGATIVE

## 2010-09-30 LAB — CREATININE, URINE, 24 HOUR
Collection Interval-UCRE24: 24
Creatinine, Urine: 103

## 2010-09-30 LAB — HIV 1/2 CONFIRMATION
HIV-1 antibody: POSITIVE
HIV-2 Ab: NEGATIVE

## 2010-09-30 LAB — BLEEDING TIME: Bleeding Time: 7

## 2010-09-30 LAB — TSH: TSH: 2.017

## 2010-09-30 LAB — MAGNESIUM
Magnesium: 1.5
Magnesium: 1.8

## 2010-09-30 LAB — CHLORIDE, URINE, RANDOM: Chloride Urine: 32

## 2010-09-30 LAB — NA AND K (SODIUM & POTASSIUM), RAND UR
Potassium Urine: 18
Sodium, Ur: 39

## 2010-09-30 LAB — OCCULT BLOOD X 1 CARD TO LAB, STOOL: Fecal Occult Bld: NEGATIVE

## 2010-09-30 LAB — MISCELLANEOUS TEST: Miscellaneous Test Results: NEGATIVE

## 2010-09-30 LAB — IMMUNOFIXATION ADD-ON

## 2010-09-30 LAB — PROTIME-INR
INR: 1.1
Prothrombin Time: 14.6

## 2010-09-30 LAB — ANTI-NEUTROPHIL ANTIBODY: Cytoplasmic Neutrophilic Ab: <1:20 {titer}

## 2010-09-30 LAB — HIV-1 GENOTYPR PLUS

## 2010-09-30 LAB — PROTEIN, TOTAL: Total Protein: >12 — ABNORMAL HIGH

## 2010-09-30 LAB — C4 COMPLEMENT: Complement C4, Body Fluid: 10 — ABNORMAL LOW

## 2010-09-30 LAB — RPR: RPR Ser Ql: NONREACTIVE

## 2010-09-30 LAB — FOLATE: Folate: 10.8

## 2010-09-30 LAB — HEPATITIS B SURFACE ANTIBODY,QUALITATIVE: Hep B S Ab: NEGATIVE

## 2010-09-30 LAB — PROTEIN, URINE, RANDOM: Total Protein, Urine: 316

## 2010-09-30 LAB — VITAMIN B12: Vitamin B-12: 744 (ref 211–911)

## 2010-10-01 ENCOUNTER — Inpatient Hospital Stay (HOSPITAL_COMMUNITY): Payer: Medicaid Other

## 2010-10-01 DIAGNOSIS — A419 Sepsis, unspecified organism: Secondary | ICD-10-CM

## 2010-10-01 DIAGNOSIS — B20 Human immunodeficiency virus [HIV] disease: Secondary | ICD-10-CM

## 2010-10-01 DIAGNOSIS — N186 End stage renal disease: Secondary | ICD-10-CM

## 2010-10-01 LAB — T-HELPER CELL (CD4) - (RCID CLINIC ONLY)
CD4 % Helper T Cell: 17 — ABNORMAL LOW
CD4 T Cell Abs: 230 — ABNORMAL LOW

## 2010-10-02 LAB — T-HELPER CELL (CD4) - (RCID CLINIC ONLY)
CD4 % Helper T Cell: 19 — ABNORMAL LOW
CD4 T Cell Abs: 300 — ABNORMAL LOW

## 2010-10-03 LAB — CROSSMATCH
ABO/RH(D): O POS
Antibody Screen: NEGATIVE

## 2010-10-03 LAB — ABO/RH: ABO/RH(D): O POS

## 2010-10-04 LAB — POCT I-STAT 4, (NA,K, GLUC, HGB,HCT)
Glucose, Bld: 86
HCT: 23 — ABNORMAL LOW
Hemoglobin: 7.8 — CL
Potassium: 4.5
Sodium: 138

## 2010-10-07 ENCOUNTER — Encounter: Payer: Medicaid Other | Admitting: Internal Medicine

## 2010-10-07 LAB — POCT HEMOGLOBIN-HEMACUE
Hemoglobin: 10.2 — ABNORMAL LOW
Hemoglobin: 10.7 — ABNORMAL LOW
Hemoglobin: 9.5 — ABNORMAL LOW

## 2010-10-08 LAB — RENAL FUNCTION PANEL
Albumin: 1.5 — ABNORMAL LOW
Albumin: 1.5 — ABNORMAL LOW
Albumin: 1.5 — ABNORMAL LOW
Albumin: 1.8 — ABNORMAL LOW
Albumin: 1.8 — ABNORMAL LOW
Albumin: 1.9 — ABNORMAL LOW
Albumin: 2.3 — ABNORMAL LOW
BUN: 24 — ABNORMAL HIGH
BUN: 28 — ABNORMAL HIGH
BUN: 39 — ABNORMAL HIGH
BUN: 64 — ABNORMAL HIGH
BUN: 65 — ABNORMAL HIGH
BUN: 66 — ABNORMAL HIGH
BUN: 83 — ABNORMAL HIGH
CO2: 22
CO2: 23
CO2: 24
CO2: 26
CO2: 27
CO2: 27
CO2: 28
Calcium: 8 — ABNORMAL LOW
Calcium: 8 — ABNORMAL LOW
Calcium: 8 — ABNORMAL LOW
Calcium: 8.2 — ABNORMAL LOW
Calcium: 8.4
Calcium: 8.5
Calcium: 9.1
Chloride: 100
Chloride: 100
Chloride: 101
Chloride: 104
Chloride: 104
Chloride: 105
Chloride: 99
Creatinine, Ser: 3.92 — ABNORMAL HIGH
Creatinine, Ser: 4.69 — ABNORMAL HIGH
Creatinine, Ser: 5.37 — ABNORMAL HIGH
Creatinine, Ser: 5.43 — ABNORMAL HIGH
Creatinine, Ser: 5.53 — ABNORMAL HIGH
Creatinine, Ser: 5.67 — ABNORMAL HIGH
Creatinine, Ser: 6.03 — ABNORMAL HIGH
GFR calc Af Amer: 10 — ABNORMAL LOW
GFR calc Af Amer: 10 — ABNORMAL LOW
GFR calc Af Amer: 10 — ABNORMAL LOW
GFR calc Af Amer: 10 — ABNORMAL LOW
GFR calc Af Amer: 12 — ABNORMAL LOW
GFR calc Af Amer: 15 — ABNORMAL LOW
GFR calc Af Amer: 9 — ABNORMAL LOW
GFR calc non Af Amer: 10 — ABNORMAL LOW
GFR calc non Af Amer: 12 — ABNORMAL LOW
GFR calc non Af Amer: 7 — ABNORMAL LOW
GFR calc non Af Amer: 8 — ABNORMAL LOW
GFR calc non Af Amer: 8 — ABNORMAL LOW
GFR calc non Af Amer: 8 — ABNORMAL LOW
GFR calc non Af Amer: 8 — ABNORMAL LOW
Glucose, Bld: 74
Glucose, Bld: 76
Glucose, Bld: 83
Glucose, Bld: 86
Glucose, Bld: 88
Glucose, Bld: 93
Glucose, Bld: 94
Phosphorus: 3.2
Phosphorus: 4.2
Phosphorus: 4.2
Phosphorus: 4.3
Phosphorus: 4.6
Phosphorus: 5.3 — ABNORMAL HIGH
Phosphorus: 5.5 — ABNORMAL HIGH
Potassium: 3.6
Potassium: 3.9
Potassium: 4.5
Potassium: 4.5
Potassium: 4.6
Potassium: 5.2 — ABNORMAL HIGH
Potassium: 5.3 — ABNORMAL HIGH
Sodium: 130 — ABNORMAL LOW
Sodium: 131 — ABNORMAL LOW
Sodium: 131 — ABNORMAL LOW
Sodium: 131 — ABNORMAL LOW
Sodium: 133 — ABNORMAL LOW
Sodium: 133 — ABNORMAL LOW
Sodium: 136

## 2010-10-08 LAB — CROSSMATCH
ABO/RH(D): O POS
ABO/RH(D): O POS
Antibody Screen: NEGATIVE
Antibody Screen: NEGATIVE

## 2010-10-08 LAB — BASIC METABOLIC PANEL
BUN: 30 — ABNORMAL HIGH
BUN: 31 — ABNORMAL HIGH
BUN: 81 — ABNORMAL HIGH
BUN: 84 — ABNORMAL HIGH
CO2: 21
CO2: 23
CO2: 26
CO2: 27
Calcium: 8.1 — ABNORMAL LOW
Calcium: 8.1 — ABNORMAL LOW
Calcium: 8.9
Calcium: 9.1
Chloride: 100
Chloride: 103
Chloride: 104
Chloride: 99
Creatinine, Ser: 4.99 — ABNORMAL HIGH
Creatinine, Ser: 5.15 — ABNORMAL HIGH
Creatinine, Ser: 6.27 — ABNORMAL HIGH
Creatinine, Ser: 6.36 — ABNORMAL HIGH
GFR calc Af Amer: 11 — ABNORMAL LOW
GFR calc Af Amer: 11 — ABNORMAL LOW
GFR calc Af Amer: 8 — ABNORMAL LOW
GFR calc Af Amer: 9 — ABNORMAL LOW
GFR calc non Af Amer: 7 — ABNORMAL LOW
GFR calc non Af Amer: 7 — ABNORMAL LOW
GFR calc non Af Amer: 9 — ABNORMAL LOW
GFR calc non Af Amer: 9 — ABNORMAL LOW
Glucose, Bld: 109 — ABNORMAL HIGH
Glucose, Bld: 112 — ABNORMAL HIGH
Glucose, Bld: 117 — ABNORMAL HIGH
Glucose, Bld: 91
Potassium: 3.8
Potassium: 4.2
Potassium: 5.9 — ABNORMAL HIGH
Potassium: 6.1 — ABNORMAL HIGH
Sodium: 130 — ABNORMAL LOW
Sodium: 130 — ABNORMAL LOW
Sodium: 131 — ABNORMAL LOW
Sodium: 132 — ABNORMAL LOW

## 2010-10-08 LAB — CBC
HCT: 21.9 — ABNORMAL LOW
HCT: 23.5 — ABNORMAL LOW
HCT: 24.7 — ABNORMAL LOW
HCT: 25.3 — ABNORMAL LOW
HCT: 25.9 — ABNORMAL LOW
HCT: 29.3 — ABNORMAL LOW
HCT: 29.5 — ABNORMAL LOW
HCT: 30.1 — ABNORMAL LOW
HCT: 33.1 — ABNORMAL LOW
HCT: 36.8
Hemoglobin: 10.2 — ABNORMAL LOW
Hemoglobin: 11.1 — ABNORMAL LOW
Hemoglobin: 12.3
Hemoglobin: 7.5 — CL
Hemoglobin: 8.3 — ABNORMAL LOW
Hemoglobin: 8.3 — ABNORMAL LOW
Hemoglobin: 8.6 — ABNORMAL LOW
Hemoglobin: 8.7 — ABNORMAL LOW
Hemoglobin: 9.7 — ABNORMAL LOW
Hemoglobin: 9.8 — ABNORMAL LOW
MCHC: 33.2
MCHC: 33.3
MCHC: 33.4
MCHC: 33.4
MCHC: 33.4
MCHC: 33.7
MCHC: 33.8
MCHC: 34.3
MCHC: 34.4
MCHC: 35.3
MCV: 100.1 — ABNORMAL HIGH
MCV: 102.3 — ABNORMAL HIGH
MCV: 102.5 — ABNORMAL HIGH
MCV: 102.7 — ABNORMAL HIGH
MCV: 102.9 — ABNORMAL HIGH
MCV: 103 — ABNORMAL HIGH
MCV: 103.4 — ABNORMAL HIGH
MCV: 98.9
MCV: 99.2
MCV: 99.7
Platelets: 180
Platelets: 187
Platelets: 212
Platelets: 229
Platelets: 259
Platelets: 271
Platelets: 300
Platelets: 308
Platelets: 319
Platelets: 334
RBC: 2.2 — ABNORMAL LOW
RBC: 2.38 — ABNORMAL LOW
RBC: 2.47 — ABNORMAL LOW
RBC: 2.47 — ABNORMAL LOW
RBC: 2.59 — ABNORMAL LOW
RBC: 2.83 — ABNORMAL LOW
RBC: 2.87 — ABNORMAL LOW
RBC: 2.94 — ABNORMAL LOW
RBC: 3.22 — ABNORMAL LOW
RBC: 3.58 — ABNORMAL LOW
RDW: 13.5
RDW: 13.8
RDW: 13.8
RDW: 13.9
RDW: 14.1
RDW: 14.2
RDW: 15.5
RDW: 15.7 — ABNORMAL HIGH
RDW: 15.7 — ABNORMAL HIGH
RDW: 16.3 — ABNORMAL HIGH
WBC: 3.3 — ABNORMAL LOW
WBC: 3.6 — ABNORMAL LOW
WBC: 4.8
WBC: 5.3
WBC: 6
WBC: 6.7
WBC: 6.8
WBC: 8.9
WBC: 9.1
WBC: 9.9

## 2010-10-08 LAB — HEMOGLOBIN AND HEMATOCRIT, BLOOD
HCT: 38.7
Hemoglobin: 12.8

## 2010-10-08 LAB — DIFFERENTIAL
Basophils Absolute: 0
Basophils Absolute: 0
Basophils Relative: 0
Basophils Relative: 0
Eosinophils Absolute: 0
Eosinophils Absolute: 0
Eosinophils Relative: 0
Eosinophils Relative: 1
Lymphocytes Relative: 13
Lymphocytes Relative: 17
Lymphs Abs: 0.9
Lymphs Abs: 0.9
Monocytes Absolute: 0.4
Monocytes Absolute: 0.8
Monocytes Relative: 11
Monocytes Relative: 8
Neutro Abs: 3.9
Neutro Abs: 5.1
Neutrophils Relative %: 75
Neutrophils Relative %: 75

## 2010-10-08 LAB — IRON AND TIBC
Iron: 39 — ABNORMAL LOW
Iron: 58
Saturation Ratios: 23
Saturation Ratios: 27
TIBC: 166 — ABNORMAL LOW
TIBC: 214 — ABNORMAL LOW
UIBC: 127
UIBC: 156

## 2010-10-08 LAB — CATH TIP CULTURE: Culture: 3

## 2010-10-08 LAB — PROTIME-INR
INR: 1.1
Prothrombin Time: 14.3

## 2010-10-08 LAB — POCT I-STAT 4, (NA,K, GLUC, HGB,HCT)
Glucose, Bld: 79
Glucose, Bld: 98
HCT: 34 — ABNORMAL LOW
HCT: 39
Hemoglobin: 11.6 — ABNORMAL LOW
Hemoglobin: 13.3
Potassium: 4.1
Potassium: 5.2 — ABNORMAL HIGH
Sodium: 134 — ABNORMAL LOW
Sodium: 136

## 2010-10-08 LAB — POCT I-STAT, CHEM 8
BUN: 31 — ABNORMAL HIGH
Calcium, Ion: 1.07 — ABNORMAL LOW
Chloride: 102
Creatinine, Ser: 5.3 — ABNORMAL HIGH
Glucose, Bld: 101 — ABNORMAL HIGH
HCT: 30 — ABNORMAL LOW
Hemoglobin: 10.2 — ABNORMAL LOW
Potassium: 4
Sodium: 136
TCO2: 30

## 2010-10-08 LAB — CULTURE, BLOOD (ROUTINE X 2)
Culture: NO GROWTH
Culture: NO GROWTH

## 2010-10-08 LAB — FERRITIN: Ferritin: 336 — ABNORMAL HIGH (ref 10–291)

## 2010-10-08 LAB — GLUCOSE, CAPILLARY
Glucose-Capillary: 100 — ABNORMAL HIGH
Glucose-Capillary: 101 — ABNORMAL HIGH

## 2010-10-08 LAB — APTT: aPTT: 27

## 2010-10-08 LAB — HEPATITIS PANEL, ACUTE
HCV Ab: NEGATIVE
Hep A IgM: NEGATIVE
Hep B C IgM: NEGATIVE
Hepatitis B Surface Ag: NEGATIVE

## 2010-10-08 LAB — HEPATITIS B SURFACE ANTIBODY,QUALITATIVE: Hep B S Ab: NEGATIVE

## 2010-10-08 LAB — POTASSIUM: Potassium: 6.4

## 2010-10-08 LAB — ALT: ALT: 40 — ABNORMAL HIGH

## 2010-10-09 LAB — CULTURE, RESPIRATORY

## 2010-10-09 LAB — POCT I-STAT 3, ART BLOOD GAS (G3+)
Acid-base deficit: 10 — ABNORMAL HIGH
Acid-base deficit: 10 — ABNORMAL HIGH
Acid-base deficit: 12 — ABNORMAL HIGH
Acid-base deficit: 7 — ABNORMAL HIGH
Acid-base deficit: 8 — ABNORMAL HIGH
Bicarbonate: 14.4 — ABNORMAL LOW
Bicarbonate: 15.7 — ABNORMAL LOW
Bicarbonate: 16.3 — ABNORMAL LOW
Bicarbonate: 16.8 — ABNORMAL LOW
Bicarbonate: 18.9 — ABNORMAL LOW
O2 Saturation: 100
O2 Saturation: 98
O2 Saturation: 98
O2 Saturation: 99
O2 Saturation: 99
Patient temperature: 93.8
Patient temperature: 97.8
Patient temperature: 97.9
Patient temperature: 98.3
Patient temperature: 98.3
TCO2: 15
TCO2: 17
TCO2: 17
TCO2: 18
TCO2: 20
pCO2 arterial: 29.8 — ABNORMAL LOW
pCO2 arterial: 31.9 — ABNORMAL LOW
pCO2 arterial: 33.2 — ABNORMAL LOW
pCO2 arterial: 34.1 — ABNORMAL LOW
pCO2 arterial: 37.5
pH, Arterial: 7.242 — ABNORMAL LOW
pH, Arterial: 7.246 — ABNORMAL LOW
pH, Arterial: 7.299 — ABNORMAL LOW
pH, Arterial: 7.34 — ABNORMAL LOW
pH, Arterial: 7.357
pO2, Arterial: 121 — ABNORMAL HIGH
pO2, Arterial: 129 — ABNORMAL HIGH
pO2, Arterial: 135 — ABNORMAL HIGH
pO2, Arterial: 138 — ABNORMAL HIGH
pO2, Arterial: 543 — ABNORMAL HIGH

## 2010-10-09 LAB — TYPE AND SCREEN
ABO/RH(D): O POS
ABO/RH(D): O POS
Antibody Screen: NEGATIVE
Antibody Screen: NEGATIVE

## 2010-10-09 LAB — POCT I-STAT 7, (LYTES, BLD GAS, ICA,H+H)
Acid-base deficit: 4 — ABNORMAL HIGH
Acid-base deficit: 6 — ABNORMAL HIGH
Acid-base deficit: 6 — ABNORMAL HIGH
Bicarbonate: 18.8 — ABNORMAL LOW
Bicarbonate: 19.9 — ABNORMAL LOW
Bicarbonate: 20.5
Calcium, Ion: 0.85 — ABNORMAL LOW
Calcium, Ion: 0.98 — ABNORMAL LOW
Calcium, Ion: 1 — ABNORMAL LOW
HCT: 29 — ABNORMAL LOW
HCT: 30 — ABNORMAL LOW
HCT: 30 — ABNORMAL LOW
Hemoglobin: 10.2 — ABNORMAL LOW
Hemoglobin: 10.2 — ABNORMAL LOW
Hemoglobin: 9.9 — ABNORMAL LOW
O2 Saturation: 100
O2 Saturation: 100
O2 Saturation: 100
Patient temperature: 34.4
Patient temperature: 34.6
Patient temperature: 34.8
Potassium: 4.1
Potassium: 4.4
Potassium: 4.9
Sodium: 139
Sodium: 140
Sodium: 140
TCO2: 20
TCO2: 21
TCO2: 22
pCO2 arterial: 29.8 — ABNORMAL LOW
pCO2 arterial: 32 — ABNORMAL LOW
pCO2 arterial: 37.8
pH, Arterial: 7.319 — ABNORMAL LOW
pH, Arterial: 7.395
pH, Arterial: 7.404 — ABNORMAL HIGH
pO2, Arterial: 549 — ABNORMAL HIGH
pO2, Arterial: 555 — ABNORMAL HIGH
pO2, Arterial: 600 — ABNORMAL HIGH

## 2010-10-09 LAB — POCT I-STAT, CHEM 8
BUN: 25 — ABNORMAL HIGH
Calcium, Ion: 1.11 — ABNORMAL LOW
Chloride: 104
Creatinine, Ser: 4.8 — ABNORMAL HIGH
Glucose, Bld: 212 — ABNORMAL HIGH
HCT: 15 — ABNORMAL LOW
Hemoglobin: 5.1 — CL
Potassium: 3.7
Sodium: 137
TCO2: 20

## 2010-10-09 LAB — COMPREHENSIVE METABOLIC PANEL
ALT: 11
ALT: 9
AST: 25
AST: 26
Albumin: 1.2 — ABNORMAL LOW
Albumin: 1.3 — ABNORMAL LOW
Alkaline Phosphatase: 71
Alkaline Phosphatase: 76
BUN: 27 — ABNORMAL HIGH
BUN: 29 — ABNORMAL HIGH
CO2: 15 — ABNORMAL LOW
CO2: 16 — ABNORMAL LOW
Calcium: 6.4 — CL
Calcium: 6.5 — ABNORMAL LOW
Chloride: 111
Chloride: 113 — ABNORMAL HIGH
Creatinine, Ser: 4.69 — ABNORMAL HIGH
Creatinine, Ser: 4.93 — ABNORMAL HIGH
GFR calc Af Amer: 11 — ABNORMAL LOW
GFR calc Af Amer: 12 — ABNORMAL LOW
GFR calc non Af Amer: 10 — ABNORMAL LOW
GFR calc non Af Amer: 9 — ABNORMAL LOW
Glucose, Bld: 118 — ABNORMAL HIGH
Glucose, Bld: 143 — ABNORMAL HIGH
Potassium: 4.7
Potassium: 5.1
Sodium: 135
Sodium: 136
Total Bilirubin: 0.6
Total Bilirubin: 0.7
Total Protein: 4.2 — ABNORMAL LOW
Total Protein: 4.3 — ABNORMAL LOW

## 2010-10-09 LAB — GLUCOSE, CAPILLARY
Glucose-Capillary: 100 — ABNORMAL HIGH
Glucose-Capillary: 102 — ABNORMAL HIGH
Glucose-Capillary: 103 — ABNORMAL HIGH
Glucose-Capillary: 104 — ABNORMAL HIGH
Glucose-Capillary: 106 — ABNORMAL HIGH
Glucose-Capillary: 107 — ABNORMAL HIGH
Glucose-Capillary: 107 — ABNORMAL HIGH
Glucose-Capillary: 107 — ABNORMAL HIGH
Glucose-Capillary: 109 — ABNORMAL HIGH
Glucose-Capillary: 115 — ABNORMAL HIGH
Glucose-Capillary: 118 — ABNORMAL HIGH
Glucose-Capillary: 133 — ABNORMAL HIGH
Glucose-Capillary: 133 — ABNORMAL HIGH
Glucose-Capillary: 65 — ABNORMAL LOW
Glucose-Capillary: 87
Glucose-Capillary: 93
Glucose-Capillary: 95
Glucose-Capillary: 96
Glucose-Capillary: 97
Glucose-Capillary: 98

## 2010-10-09 LAB — BASIC METABOLIC PANEL
BUN: 40 — ABNORMAL HIGH
BUN: 49 — ABNORMAL HIGH
CO2: 18 — ABNORMAL LOW
CO2: 19
Calcium: 7.2 — ABNORMAL LOW
Calcium: 8 — ABNORMAL LOW
Chloride: 108
Chloride: 110
Creatinine, Ser: 5.38 — ABNORMAL HIGH
Creatinine, Ser: 5.77 — ABNORMAL HIGH
GFR calc Af Amer: 10 — ABNORMAL LOW
GFR calc Af Amer: 9 — ABNORMAL LOW
GFR calc non Af Amer: 8 — ABNORMAL LOW
GFR calc non Af Amer: 8 — ABNORMAL LOW
Glucose, Bld: 111 — ABNORMAL HIGH
Glucose, Bld: 95
Potassium: 5
Potassium: 5.2 — ABNORMAL HIGH
Sodium: 133 — ABNORMAL LOW
Sodium: 136

## 2010-10-09 LAB — CBC
HCT: 15.3 — ABNORMAL LOW
HCT: 21.8 — ABNORMAL LOW
HCT: 23.5 — ABNORMAL LOW
HCT: 25 — ABNORMAL LOW
HCT: 25.5 — ABNORMAL LOW
HCT: 25.7 — ABNORMAL LOW
HCT: 29.3 — ABNORMAL LOW
HCT: 30.2 — ABNORMAL LOW
HCT: 30.9 — ABNORMAL LOW
Hemoglobin: 10.1 — ABNORMAL LOW
Hemoglobin: 10.5 — ABNORMAL LOW
Hemoglobin: 5.1 — CL
Hemoglobin: 7.5 — CL
Hemoglobin: 7.9 — CL
Hemoglobin: 8.3 — ABNORMAL LOW
Hemoglobin: 8.6 — ABNORMAL LOW
Hemoglobin: 8.8 — ABNORMAL LOW
Hemoglobin: 9.7 — ABNORMAL LOW
MCHC: 33.2
MCHC: 33.4
MCHC: 33.4
MCHC: 33.5
MCHC: 33.6
MCHC: 33.7
MCHC: 34.1
MCHC: 34.3
MCHC: 34.4
MCV: 104.1 — ABNORMAL HIGH
MCV: 92.2
MCV: 92.6
MCV: 92.6
MCV: 93
MCV: 93.1
MCV: 93.3
MCV: 93.9
MCV: 94
Platelets: 117 — ABNORMAL LOW
Platelets: 124 — ABNORMAL LOW
Platelets: 141 — ABNORMAL LOW
Platelets: 164
Platelets: 166
Platelets: 196
Platelets: 206
Platelets: 216
Platelets: 271
RBC: 1.47 — ABNORMAL LOW
RBC: 2.36 — ABNORMAL LOW
RBC: 2.5 — ABNORMAL LOW
RBC: 2.68 — ABNORMAL LOW
RBC: 2.75 — ABNORMAL LOW
RBC: 2.77 — ABNORMAL LOW
RBC: 3.15 — ABNORMAL LOW
RBC: 3.22 — ABNORMAL LOW
RBC: 3.33 — ABNORMAL LOW
RDW: 15.8 — ABNORMAL HIGH
RDW: 16 — ABNORMAL HIGH
RDW: 16.1 — ABNORMAL HIGH
RDW: 16.3 — ABNORMAL HIGH
RDW: 16.5 — ABNORMAL HIGH
RDW: 16.5 — ABNORMAL HIGH
RDW: 16.7 — ABNORMAL HIGH
RDW: 16.9 — ABNORMAL HIGH
RDW: 17.4 — ABNORMAL HIGH
WBC: 11.3 — ABNORMAL HIGH
WBC: 14.6 — ABNORMAL HIGH
WBC: 15.1 — ABNORMAL HIGH
WBC: 15.2 — ABNORMAL HIGH
WBC: 15.3 — ABNORMAL HIGH
WBC: 17.5 — ABNORMAL HIGH
WBC: 6.8
WBC: 8.1
WBC: 9.3

## 2010-10-09 LAB — CULTURE, BLOOD (ROUTINE X 2)
Culture: NO GROWTH
Culture: NO GROWTH

## 2010-10-09 LAB — RENAL FUNCTION PANEL
Albumin: 1.1 — ABNORMAL LOW
Albumin: 1.4 — ABNORMAL LOW
Albumin: 1.5 — ABNORMAL LOW
Albumin: 2.2 — ABNORMAL LOW
BUN: 25 — ABNORMAL HIGH
BUN: 43 — ABNORMAL HIGH
BUN: 51 — ABNORMAL HIGH
BUN: 48 — ABNORMAL HIGH
CO2: 18 — ABNORMAL LOW
CO2: 19
CO2: 22
CO2: 18 — ABNORMAL LOW
Calcium: 6.2 — CL
Calcium: 7.4 — ABNORMAL LOW
Calcium: 8 — ABNORMAL LOW
Calcium: 8.7
Chloride: 108
Chloride: 110
Chloride: 115 — ABNORMAL HIGH
Chloride: 108
Creatinine, Ser: 4.37 — ABNORMAL HIGH
Creatinine, Ser: 4.83 — ABNORMAL HIGH
Creatinine, Ser: 5.76 — ABNORMAL HIGH
Creatinine, Ser: 5.77 — ABNORMAL HIGH
GFR calc Af Amer: 11 — ABNORMAL LOW
GFR calc Af Amer: 13 — ABNORMAL LOW
GFR calc Af Amer: 9 — ABNORMAL LOW
GFR calc Af Amer: 9 — ABNORMAL LOW
GFR calc non Af Amer: 11 — ABNORMAL LOW
GFR calc non Af Amer: 8 — ABNORMAL LOW
GFR calc non Af Amer: 9 — ABNORMAL LOW
GFR calc non Af Amer: 8 — ABNORMAL LOW
Glucose, Bld: 108 — ABNORMAL HIGH
Glucose, Bld: 129 — ABNORMAL HIGH
Glucose, Bld: 65 — ABNORMAL LOW
Glucose, Bld: 73
Phosphorus: 6 — ABNORMAL HIGH
Phosphorus: 6.2 — ABNORMAL HIGH
Phosphorus: 7.5 — ABNORMAL HIGH
Phosphorus: 5.9 — ABNORMAL HIGH
Potassium: 3.3 — ABNORMAL LOW
Potassium: 4.1
Potassium: 4.3
Potassium: 5.3 — ABNORMAL HIGH
Sodium: 136
Sodium: 138
Sodium: 138
Sodium: 134 — ABNORMAL LOW

## 2010-10-09 LAB — CARDIAC PANEL(CRET KIN+CKTOT+MB+TROPI)
CK, MB: 11.1 — ABNORMAL HIGH
CK, MB: 12.1 — ABNORMAL HIGH
CK, MB: 9.5 — ABNORMAL HIGH
Relative Index: 4.4 — ABNORMAL HIGH
Relative Index: 6.1 — ABNORMAL HIGH
Relative Index: 6.5 — ABNORMAL HIGH
Total CK: 155
Total CK: 170
Total CK: 275 — ABNORMAL HIGH
Troponin I: 0.26 — ABNORMAL HIGH
Troponin I: 0.44 — ABNORMAL HIGH
Troponin I: 0.56

## 2010-10-09 LAB — POCT I-STAT EG7
Acid-base deficit: 7 — ABNORMAL HIGH
Bicarbonate: 19.4 — ABNORMAL LOW
Calcium, Ion: 1.02 — ABNORMAL LOW
HCT: 30 — ABNORMAL LOW
Hemoglobin: 10.2 — ABNORMAL LOW
O2 Saturation: 58
Potassium: 3.7
Sodium: 144
TCO2: 21
pCO2, Ven: 42.4 — ABNORMAL LOW
pH, Ven: 7.27
pO2, Ven: 34

## 2010-10-09 LAB — APTT: aPTT: 30

## 2010-10-09 LAB — URINE CULTURE
Colony Count: NO GROWTH
Culture: NO GROWTH

## 2010-10-09 LAB — URINALYSIS, ROUTINE W REFLEX MICROSCOPIC
Glucose, UA: NEGATIVE
Ketones, ur: 15 — AB
Nitrite: NEGATIVE
Protein, ur: >300 — AB
Specific Gravity, Urine: 1.024
Urobilinogen, UA: 1
pH: 5.5

## 2010-10-09 LAB — DIFFERENTIAL
Basophils Absolute: 0
Basophils Relative: 1
Eosinophils Absolute: 0.1
Eosinophils Relative: 1
Lymphocytes Relative: 40
Lymphs Abs: 3.3
Monocytes Absolute: 0.9
Monocytes Relative: 11
Neutro Abs: 3.8
Neutrophils Relative %: 46

## 2010-10-09 LAB — VITAMIN B12: Vitamin B-12: 427 (ref 211–911)

## 2010-10-09 LAB — MAGNESIUM
Magnesium: 1.5
Magnesium: 2

## 2010-10-09 LAB — POCT I-STAT GLUCOSE
Glucose, Bld: 112 — ABNORMAL HIGH
Operator id: 151361

## 2010-10-09 LAB — IRON AND TIBC
Iron: 20 — ABNORMAL LOW
Iron: 75
Saturation Ratios: 17 — ABNORMAL LOW
TIBC: 120 — ABNORMAL LOW
UIBC: 100
UIBC: <55

## 2010-10-09 LAB — URINE MICROSCOPIC-ADD ON

## 2010-10-09 LAB — LACTIC ACID, PLASMA: Lactic Acid, Venous: 2.8 — ABNORMAL HIGH

## 2010-10-09 LAB — DIC (DISSEMINATED INTRAVASCULAR COAGULATION)PANEL
Fibrinogen: 153 — ABNORMAL LOW
INR: 1.6 — ABNORMAL HIGH
Platelets: 53 — ABNORMAL LOW
Prothrombin Time: 19.4 — ABNORMAL HIGH

## 2010-10-09 LAB — PHOSPHORUS: Phosphorus: 7.3 — ABNORMAL HIGH

## 2010-10-09 LAB — FERRITIN
Ferritin: 213 (ref 10–291)
Ferritin: 682 — ABNORMAL HIGH (ref 10–291)

## 2010-10-09 LAB — PREPARE RBC (CROSSMATCH)

## 2010-10-09 LAB — POCT HEMOGLOBIN-HEMACUE
Hemoglobin: 7.9 — CL
Hemoglobin: 8.7 — ABNORMAL LOW

## 2010-10-09 LAB — DIC (DISSEMINATED INTRAVASCULAR COAGULATION) PANEL (NOT AT ARMC)
D-Dimer, Quant: 1.85 — ABNORMAL HIGH
Smear Review: NONE SEEN
aPTT: 36

## 2010-10-09 LAB — CORTISOL: Cortisol, Plasma: 18.4

## 2010-10-09 LAB — RETICULOCYTES
RBC.: 2.76 — ABNORMAL LOW
Retic Count, Absolute: 127
Retic Ct Pct: 4.6 — ABNORMAL HIGH

## 2010-10-09 LAB — PTH, INTACT AND CALCIUM
Calcium, Total (PTH): 7.6 — ABNORMAL LOW
PTH: 156.8 — ABNORMAL HIGH

## 2010-10-09 LAB — FOLATE: Folate: 8.1

## 2010-10-09 LAB — PROTIME-INR
INR: 1.4
Prothrombin Time: 17.6 — ABNORMAL HIGH

## 2010-10-11 NOTE — Discharge Summary (Signed)
NAMEMANVI, MANNIS NO.:  [id number]  MEDICAL RECORD NO.:  [id number]  LOCATION:  [id number]                         FACILITY:  [facility]  PHYSICIAN:  Oval Linsey, MD     DATE OF BIRTH:  Feb 15, 1955  DATE OF ADMISSION:  09/09/2010 DATE OF DISCHARGE:  10/01/2010                              DISCHARGE SUMMARY   DISCHARGE DIAGNOSES: 1. Septic shock due to methicillin-susceptible Staphylococcus aureus     bacteremia. 2. MSSA graft infection requiring surgical removal. 3. Human immunodeficiency virus. 4. End-stage renal disease requiring chronic hemodialysis. 5. Groin pain.  DISCHARGE MEDICATIONS: 1. Acetaminophen 650 mg p.o. q.6 h p.r.n. for pain. 2. Acetaminophen 650 mg rectally q.6 h p.r.n. for pain. 3. Albumin (human) 25% injection 12.5 grams intravenously as needed     for low serum albumin. 4. Antiseptic rinse solution 15 mL rinse twice daily. 5. Atazanavir 300 mg p.o. daily. 6. Calcium carbonate solution 500 mg p.o. q.6 h 7. Cefazolin 2 grams IV given with hemodialysis after discharge until     October 08, 2010. 8. Aranesp 200 mcg IV given with hemodialysis once a week. 9. Docusate 283 mg rectally p.r.n. for constipation. 10.Famotidine 20 mg p.o. daily. 11.Hydroxyzine 25 mg p.o. q.8 hours for itching. 12.Lamivudine 25 mg p.o. daily. 13.Midodrine 10 mg p.o. 3 times a day. 14.Nutrition supplement soy 237 mL p.o. twice daily. 15.Phenergan 12.5 mg p.o. q.6 h p.r.n. for nausea. 16.Phenergan 25 mg rectally twice daily p.r.n. for nausea. 17.Protein supplemental liquid 13 mL twice daily p.o. 18.Renal formula vitamin tablet, 1 tablet p.o. daily. 19.Ritonavir 100 mg p.o. daily. 20.Sarna anti-itch lotion one application topically p.r.n. for     itching. 21.Sorbitol 70% solution 13 mL p.o. p.r.n. for constipation. 22.Stavudine 20 mg p.o. daily. 23.Tramadol 50-100 mg q.12 h p.r.n. for pain. 24.Vitamin B with C capsule, 1 capsule p.o. daily. 25.Zolpidem 5 mg p.o.  daily for insomnia.  DISPOSITION AND FOLLOWUP:   Ms. Bloor will be followed up by Dr. Cathren Laine on October 07, 2010 at  3:15 p.m.  PROCEDURE PERFORMED:  1. Transesophageal echo done on September 18, 2010.  No vegetation.     Left ventricle:  The cavity size was dilated.  Systolic function     was severely reduced.  The estimated EF fraction is at a range of     25-30%.  Diffuse hypokinesis.   2.  2-D echo without contrast done on September 16, 2010:  Left      ventricle:  Wall thickness was increased in a pattern of mild left      ventricular hypertrophy.  Systolic function was normal.  The      estimated EF was in the range of 50-55%.  3.  Chest x-ray on September 09, 2010:  Mild interstitial prominence     bilaterally without convincing pulmonary edema.  4. CT of abdomen and pelvis without contrast done on September 10, 2010:  No obvious abdominal abscess is found.  The liver is     diffusely enlarged with diffuse fatty infiltration.  There is     apparent retroperitoneal lymphadenopathy.  Diffuse anasarca with     edema  throughout the subcutaneous fat.  Significant bilateral     growing area of lymphadenopathy.  Small left pleural effusion with     basilar atelectasis.  5. Removal of suspected infected AV graft on September 11, 2010, by     General surgeon, Dr. Kellie Simmering.  6. Insertion of a Diatek catheter via left internal jugular vein on     September 13, 2010, by Dr. Kellie Simmering.  CONSULTATIONS:   1. Consultation with Dr. Tommy Medal, Infectious Disease:  Regarding MSSA     bacteremia in the setting of a graft infection.  Dr. Tommy Medal     suggested giving cefazolin for at least 6 weeks with IV     antibiotics and evaluate her heart valves with transesophageal     echocardiogram.  Regarding HIV disease, a regimen of stavudine,      lamivudine, ritonavir-boosted atazanavir.  Regarding opportunistic      infection prophylaxis:  Dr. Tommy Medal suggested giving Bactrim.  BRIEF ADMISSION  HISTORY:    Ms. Hindley is a 55 year old woman with a past medical history significant  for HIV, and end-stage renal disease on hemodialysis, who presents to the  ED on September 09, 2010, with 3 days history of pain at her graft area on  the right arm, redness and erythema and a 2 day history of cramping  abdominal pain and diarrhea.  She presents to the ED with temperature of  104, tachycardia, and a systolic blood pressure 60.  She remained  hypotensive in spite of IV fluid resuscitation.  She was admitted to ICU.  She was started on Zosyn and vancomycin, which were changed to Avelox for MSSA and left upper lobe pneumonia.  C. diff was negative. Cortisol level was 24.  CT of abdomen and pelvis did not find any abscess.  On September 11, 2010, the infected AV graft was removed, a  vein patch angioplasty of the right brachial artery and a primary  repair of the right axillary vein was performed.  She was transferred  on September 12, 2010, to the teaching service for further management.  PHYSICAL EXAMINATION:    VITAL SIGNS:  Temperature 96.9, blood pressure 86/62, heart rate 56,  respiration rate 14, oxygen saturation 100% on 2 L. GENERAL:  No acute distress. HEENT:  PERRLA. NECK:  Supple.  No JVD or bruits. HEART:  S1 and S2, normal rhythm and rate.  No murmurs or gallops. LUNGS:  Good air movement bilaterally.  She had wheezes and rales. ABDOMEN:  Soft, active bowel sounds, distended,  tender over left lower quadrant, no rebound pain. NEURO:  Alert, oriented to person, place, and time.  Cranial nerves  II through XII grossly intact.  ADMISSION LABS: 1. Troponin 0.29, lactic acid 3.3, lipase 22, procalcitonin 12.95. 2. CBC:  WBC 6.6, hemoglobin 11.5, hematocrit 35.1, platelet 43,000. 3. C. diff by PCR negative. 4. CO oximetry: total hemoglobin 10.4, oxygen saturation at 87.6,     carboxyhemoglobin 1.3, methemoglobin 0.3. 5. CMP:  Sodium 136, potassium 3.1, chloride 102,  bicarbonate 27,     glucose 90, BUN 25, creatinine 6.05, phosphorus 2.6. 6. MRSA/PCR negative. 7. PT 13.4, INR 1.71, PTT 52. 8. D-dimer 1.97.  Cortisone level 22.8. 9. ABG, pH 7.401, bicarbonate 25, PCO2 41.1, PO2 59.4. 10.Giardia screen negative.  Cryptosporidium screen negative. 11.Hepatitis B surface antigen negative. 12.Magnesium 1.7. 13.Blood culture on September 09, 2010, showed Staphylococcus aureus. 14.CD4, 270. 15.HIV vital load is 99,900. 16.HLA-B27 is not detected. 17.Blood culture repeated on September 12, 2010, shows no growth.  HOSPITAL COURSE: 1. Ms. Nehmer was admitted to the ICU on September 09, 2010 due to MSSA     bacteremia-induced septic shock.  Infected right arm AV graft was     removed on September 11, 2010.  She was first started on     IV Zosyn and vancomycin.  On September 12, 2010, She was transferred      to the internal medicine inpatient service.  At that time, she was      afebrile, but still hypotensive.  Antibiotics were chaned to IV      cefazolin.  She responded well to treatment.  Repeated blood      cultures on September 12, 2010, showed no growth.  TEE on September 18, 2010, showed no vegetations.  At discharge, she was afebrile,      with no leukocytosis, negative blood cultures and a negative     TEE.  She will continue cefazolin until October 08, 2010.  Cefazolin      will be given when she gets hemodialysis.  2. HIV disease:  We continued her home medications.  Her CD4 was 270     and viral load was 99,900.  3. End-stage renal disease:  She is on hemodialysis, 3 times a     week.  4. Groin pain.  Ms. Hameister had mild groin pain on the left.  There was      tenderness on palpation.  The etiology was not clear.  CT of      abdomen and pelvis did not reveal any acute process.  We treated      her symptomatically.  DISCHARGE VITALS:  Temperature 98, blood pressure 100/53, pulse 68,  respirations 16, oxygen saturation 92% on 2 L of  oxygen.  DISCHARGE LABS: WBC count 3.8, hemoglobin 8.1, hematocrit 25.5, platelet 141. Sodium 136, potassium 4.4, chloride 102, bicarbonate 31, glucose 76,  BUN 21, creatinine 4.3, albumin 1.6, calcium 9.8, phosphorus 3.5.   ______________________________ Ivor Costa, MD   ______________________________ Oval Linsey, MD   XN/MEDQ  D:  09/24/2010  T:  09/24/2010  Job:  [id number]  cc:   Janell Quiet, MD  Electronically Signed by Ivor Costa MD on 10/02/2010 07:00:54 PM Electronically Signed by Oval Linsey  on 10/11/2010 10:45:41 AM

## 2010-10-11 NOTE — Discharge Summary (Signed)
  [name], [name] NO.:  [id number]  MEDICAL RECORD NO.:  [id number]  LOCATION:  [id number]                         FACILITY:  [facility]  PHYSICIAN:  Oval Linsey, MD     DATE OF BIRTH:  18-Apr-1955  DATE OF ADMISSION:  09/09/2010 DATE OF DISCHARGE:  10/01/2010                              DISCHARGE SUMMARY   ADDENDUM:  [name] was discharged to an [facility] for  rehabilitation.  Outpatient hemodialysis was arranged closer to this  facility.  This process took several days and delayed her discharge.   No other changes were made.   ______________________________ Ivor Costa, MD   ______________________________ Oval Linsey, MD   XN/MEDQ  D:  09/30/2010  T:  09/30/2010  Job:  [id number]  Electronically Signed by Ivor Costa MD on 10/02/2010 07:01:23 PM Electronically Signed by Oval Linsey  on 10/11/2010 10:47:34 AM

## 2010-11-19 ENCOUNTER — Telehealth: Payer: Self-pay | Admitting: *Deleted

## 2010-11-19 NOTE — Telephone Encounter (Signed)
Call made to patient after receiving referral request from [facility] ph# [phone]. Referral was requesting NPI/CA # for pt to be dialyze at [facility] dialysis unit in [place], [place].  When I spoke with patient, she stated that she was in [place] at a nursing home and was going their for dialysis but she is now back in [place], being followed by [facility].  [facility] made aware that pt will no longer require their services.Despina Hidden Cassady11/13/20124:37 PM

## 2010-11-21 ENCOUNTER — Other Ambulatory Visit: Payer: Self-pay

## 2010-11-21 DIAGNOSIS — Z01810 Encounter for preprocedural cardiovascular examination: Secondary | ICD-10-CM

## 2010-11-21 DIAGNOSIS — N186 End stage renal disease: Secondary | ICD-10-CM

## 2010-12-10 ENCOUNTER — Encounter: Payer: Self-pay | Admitting: Vascular Surgery

## 2010-12-11 ENCOUNTER — Ambulatory Visit (INDEPENDENT_AMBULATORY_CARE_PROVIDER_SITE_OTHER): Payer: Medicaid Other | Admitting: Vascular Surgery

## 2010-12-11 ENCOUNTER — Other Ambulatory Visit (INDEPENDENT_AMBULATORY_CARE_PROVIDER_SITE_OTHER): Payer: Medicaid Other | Admitting: *Deleted

## 2010-12-11 ENCOUNTER — Encounter: Payer: Self-pay | Admitting: Vascular Surgery

## 2010-12-11 VITALS — BP 108/66 | HR 67 | Resp 16 | Ht 69.0 in | Wt 133.0 lb

## 2010-12-11 DIAGNOSIS — N186 End stage renal disease: Secondary | ICD-10-CM

## 2010-12-11 NOTE — Progress Notes (Signed)
[facility]  Patient name: [name] MRN: [id number] DOB: 1955-09-28 Sex: female  REASON FOR CONSULT: Evaluate for new hemodialysis access.  HPI: [name] is a 55 y.o. female who has a complicated access history.  1. In October 2009 she had a left upper arm AV graft placed. She was noted at that time to have a high bifurcation of her brachial artery she later presented with venous hypertension and massive edema of her left upper arm had removal of the graft to repair of the axillary artery with a vein patch. This apparently bled and was replaced with a bovine pericardial patch. 2. She had dehiscence of this and required left axillary artery to radial artery bypass in November of 2009. 3. Of note in January 2010 she had incision and drainage of 2 large lymphoceles her right side and require placement of a VAC.  4. In May of 2010 she had a right upper arm AV fistula placed which ultimately failed.  5. She had a new right upper arm graft placed in June of 2010 and has had multiple thrombectomies of her graft.  6. In September 2012 she had removal of and suspected infected AV graft of the right upper arm and primary repair of the axillary artery. She had a left IJ catheter placed in September.  Based on my review of her records it does not appear that she is a candidate for a basilic vein transposition in either arm. She's failed previous fistula creation on the right and I do not see any further options for upper extremity access.  Past Medical History  Diagnosis Date  . Human immunodeficiency virus (HIV) disease   . Hypertension   . ESRD (end stage renal disease)   . Dialysis patient     pt on dialysis since 2010.  [name] Clostridium difficile infection 04/04/10  . Bacteriuria, asymptomatic 04/04/10    Culture grew VRE sensitive to linesolid   . MGUS (monoclonal gammopathy of unknown significance)   . History of bacteremia     MSSA  . Renal insufficiency   .  Anemia     Family History  Problem Relation Age of Onset  . Alcohol abuse      family h/o addiction/alcoholism  . Diabetes Cousin     first degree relatives  . Kidney disease Mother   . Kidney disease Maternal Uncle   . Cancer Sister     SOCIAL HISTORY: History  Substance Use Topics  . Smoking status: Never Smoker   . Smokeless tobacco: Not on file  . Alcohol Use: No    No Known Allergies  Current Outpatient Prescriptions  Medication Sig Dispense Refill  . acetaminophen (TYLENOL) 650 MG CR tablet Take 650 mg by mouth every 8 (eight) hours as needed.        [name] aspirin 81 MG tablet Take 81 mg by mouth daily.        [name] atazanavir (REYATAZ) 300 MG capsule Take 300 mg by mouth daily with breakfast.        . b complex vitamins tablet Take 1 tablet by mouth daily.        [name] b complex-vitamin c-folic acid (NEPHRO-VITE) 0.8 MG TABS Take 0.8 mg by mouth daily.        . darbepoetin (ARANESP, ALBUMIN FREE,) 150 MCG/0.3ML SOLN Inject 150 mcg into the skin. Use weekly for Epogen 20000 units. Dialysis nurse to administer.       [name] ENSURE (ENSURE)  Take 237 mLs by mouth 3 (three) times daily between meals.        . famotidine (PEPCID) 20 MG tablet Take 20 mg by mouth daily.        . iron sucrose (VENOFER) 20 MG/ML injection 100 mg IV on Tuesday HD(HD RN to dispense)       . lamiVUDine (EPIVIR) 10 MG/ML solution Take 1 teaspoon by mouth every day.       . paricalcitol (ZEMPLAR) 5 MCG/ML injection 4 micrograms IV- dispensed by dialysis nurse.       . ritonavir (NORVIR) 100 MG TABS Take 100 mg by mouth daily with breakfast.        . Stavudine (ZERIT) 1 MG/ML SOLR Take 4 teaspoons(20 ml) by mouth every day.       . sulfamethoxazole-trimethoprim (BACTRIM DS) 800-160 MG per tablet 1 tablet. Take 1 tablet by mouth every Monday, Wednesday and Friday.       . vancomycin (VANCOCIN) 50 mg/mL oral solution Take by mouth every 6 (six) hours. 500 mg po Q6H for 10 days.         REVIEW OF SYSTEMS: Valu.Nieves ] denotes  positive finding; [  ] denotes negative finding CARDIOVASCULAR:  [ ]  chest pain   [ ]  chest pressure   [ ]  palpitations   [ ]  orthopnea   [ ]  dyspnea on exertion   [ ]  claudication   [ ]  rest pain   [ ]  DVT   [ ]  phlebitis PULMONARY:   [ ]  productive cough   [ ]  asthma   [ ]  wheezing NEUROLOGIC:   [ ]  weakness  [ ]  paresthesias  [ ]  aphasia  [ ]  amaurosis  [ ]  dizziness HEMATOLOGIC:   [ ]  bleeding problems   [ ]  clotting disorders MUSCULOSKELETAL:  [ ]  joint pain   [ ]  joint swelling Valu.Nieves ] leg swelling L>R GASTROINTESTINAL: [ ]   blood in stool  [ ]   hematemesis GENITOURINARY:  [ ]   dysuria  [ ]   hematuria PSYCHIATRIC:  [ ]  history of major depression INTEGUMENTARY:  [ ]  rashes  [ ]  ulcers CONSTITUTIONAL:  [ ]  fever   [ ]  chills  PHYSICAL EXAM: Filed Vitals:   12/11/10 1105  BP: 108/66  Pulse: 67  Resp: 16  Height: 5\' 9"  (1.753 m)  Weight: 133 lb (60.328 kg)  SpO2: 100%   Body mass index is 19.64 kg/(m^2). GENERAL: The patient is a well-nourished female, in no acute distress. The vital signs are documented above. CARDIOVASCULAR: There is a regular rate and rhythm without significant murmur appreciated.  PULMONARY: There is good air exchange bilaterally without wheezing or rales. She has a weakly palpable radial pulse bilaterally. She has palpable femoral pulses bilaterally. She has a diminished right dorsalis pedis pulse a palpable left dorsalis pedis pulse. Significant lymphedema the left leg with lesser degree of lymphedema in the right leg.  DATA:  Lab Results  Component Value Date   WBC 3.3* 09/30/2010   HGB 9.0* 09/30/2010   HCT 28.0* 09/30/2010   MCV 99.3 09/30/2010   PLT 151 09/30/2010   Lab Results  Component Value Date   NA 135 09/30/2010   K 4.0 09/30/2010   CL 101 09/30/2010   CO2 29 09/30/2010   Lab Results  Component Value Date   CREATININE 4.57* 09/30/2010   Lab Results  Component Value Date   INR 1.50* 09/11/2010   INR 1.71* 09/09/2010   INR 1.26 05/25/2010  MEDICAL ISSUES: I think your Digital access option is a thigh graft. Given the significant lymphedema she has on the left I would be reluctant to place a thigh graft on the left as this may worsen her swelling. Her pulse on the right is slightly diminished. I recommend that we proceed with an arteriogram in order to further assess her circulation on the right. I picked a place to graft in and have her developed a limb threatening situation appeared. Her arteriogram is scheduled for December 17. Pending these results we can potentially schedule her for a right thigh AV graft. She may need preoperative ABIs. He dialyzes on Tuesdays Thursdays and Saturdays. This patient does have a history of HIV.  [facility] [place] [facility]: [phone]

## 2010-12-12 ENCOUNTER — Other Ambulatory Visit: Payer: Self-pay | Admitting: *Deleted

## 2010-12-18 NOTE — Procedures (Unsigned)
VASCULAR LAB EXAM  INDICATION:  Multiple failed AV fistulas and grafts in the bilateral upper extremities, now in need of new access.  HISTORY: Diabetes: Cardiac: Hypertension:  EXAM:  The left basilic vein was mapped and measures 0.37 cm to 0.22 cm in diameter. Of note, the brachial artery has an anomalous bifurcation in the proximal upper arm, and the previous failed fistula appears to have been anastomosed to the ulnar branch of the bifurcation.  In the upper arm, the ulnar artery flows retrograde and the forearm flows antegrade.  The previous fistula flows towards the wrist.  The radial artery appears to be within normal limits.  IMPRESSION: 1. Patent left basilic vein with measurements as described on diagram. 2. 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  ___________________________________________ Judeth Cornfield. Scot Dock, M.D.  LT/MEDQ  D:  12/11/2010  T:  12/11/2010  Job:  [id number]

## 2010-12-23 ENCOUNTER — Ambulatory Visit (HOSPITAL_COMMUNITY)
Admission: RE | Admit: 2010-12-23 | Discharge: 2010-12-23 | Disposition: A | Discharge status: Home / Self Care | Payer: Medicaid Other | Source: Ambulatory Visit | Attending: Vascular Surgery | Admitting: Vascular Surgery

## 2010-12-23 ENCOUNTER — Emergency Department (HOSPITAL_COMMUNITY): Payer: Medicaid Other

## 2010-12-23 ENCOUNTER — Encounter (HOSPITAL_COMMUNITY): Payer: Self-pay | Admitting: Cardiology

## 2010-12-23 ENCOUNTER — Other Ambulatory Visit: Payer: Self-pay

## 2010-12-23 ENCOUNTER — Inpatient Hospital Stay (HOSPITAL_COMMUNITY)
Admission: EM | Admit: 2010-12-23 | Discharge: 2010-12-31 | DRG: 314 | Disposition: A | Discharge status: Home / Self Care | Payer: Medicaid Other | Attending: Internal Medicine | Admitting: Internal Medicine

## 2010-12-23 ENCOUNTER — Encounter (HOSPITAL_COMMUNITY)
Admission: RE | Disposition: A | Discharge status: Home / Self Care | Payer: Self-pay | Source: Ambulatory Visit | Attending: Vascular Surgery

## 2010-12-23 DIAGNOSIS — N2581 Secondary hyperparathyroidism of renal origin: Secondary | ICD-10-CM | POA: Diagnosis present

## 2010-12-23 DIAGNOSIS — E8779 Other fluid overload: Secondary | ICD-10-CM | POA: Diagnosis present

## 2010-12-23 DIAGNOSIS — Z7982 Long term (current) use of aspirin: Secondary | ICD-10-CM

## 2010-12-23 DIAGNOSIS — I428 Other cardiomyopathies: Secondary | ICD-10-CM | POA: Diagnosis present

## 2010-12-23 DIAGNOSIS — I120 Hypertensive chronic kidney disease with stage 5 chronic kidney disease or end stage renal disease: Secondary | ICD-10-CM | POA: Diagnosis present

## 2010-12-23 DIAGNOSIS — Y841 Kidney dialysis as the cause of abnormal reaction of the patient, or of later complication, without mention of misadventure at the time of the procedure: Secondary | ICD-10-CM | POA: Diagnosis present

## 2010-12-23 DIAGNOSIS — Z992 Dependence on renal dialysis: Secondary | ICD-10-CM | POA: Insufficient documentation

## 2010-12-23 DIAGNOSIS — D61818 Other pancytopenia: Secondary | ICD-10-CM | POA: Diagnosis present

## 2010-12-23 DIAGNOSIS — R609 Edema, unspecified: Secondary | ICD-10-CM | POA: Diagnosis present

## 2010-12-23 DIAGNOSIS — R6521 Severe sepsis with septic shock: Secondary | ICD-10-CM | POA: Diagnosis present

## 2010-12-23 DIAGNOSIS — R6520 Severe sepsis without septic shock: Secondary | ICD-10-CM

## 2010-12-23 DIAGNOSIS — J189 Pneumonia, unspecified organism: Secondary | ICD-10-CM | POA: Diagnosis present

## 2010-12-23 DIAGNOSIS — A4101 Sepsis due to Methicillin susceptible Staphylococcus aureus: Secondary | ICD-10-CM | POA: Diagnosis present

## 2010-12-23 DIAGNOSIS — Z9119 Patient's noncompliance with other medical treatment and regimen: Secondary | ICD-10-CM

## 2010-12-23 DIAGNOSIS — M948X9 Other specified disorders of cartilage, unspecified sites: Secondary | ICD-10-CM | POA: Diagnosis present

## 2010-12-23 DIAGNOSIS — I10 Essential (primary) hypertension: Secondary | ICD-10-CM | POA: Diagnosis present

## 2010-12-23 DIAGNOSIS — N186 End stage renal disease: Secondary | ICD-10-CM | POA: Insufficient documentation

## 2010-12-23 DIAGNOSIS — N185 Chronic kidney disease, stage 5: Secondary | ICD-10-CM

## 2010-12-23 DIAGNOSIS — Z21 Asymptomatic human immunodeficiency virus [HIV] infection status: Secondary | ICD-10-CM | POA: Insufficient documentation

## 2010-12-23 DIAGNOSIS — D472 Monoclonal gammopathy: Secondary | ICD-10-CM | POA: Diagnosis present

## 2010-12-23 DIAGNOSIS — A419 Sepsis, unspecified organism: Secondary | ICD-10-CM | POA: Diagnosis present

## 2010-12-23 DIAGNOSIS — D638 Anemia in other chronic diseases classified elsewhere: Secondary | ICD-10-CM | POA: Diagnosis present

## 2010-12-23 DIAGNOSIS — B20 Human immunodeficiency virus [HIV] disease: Secondary | ICD-10-CM | POA: Diagnosis present

## 2010-12-23 DIAGNOSIS — R0902 Hypoxemia: Secondary | ICD-10-CM

## 2010-12-23 DIAGNOSIS — E46 Unspecified protein-calorie malnutrition: Secondary | ICD-10-CM | POA: Diagnosis present

## 2010-12-23 DIAGNOSIS — D696 Thrombocytopenia, unspecified: Secondary | ICD-10-CM | POA: Diagnosis present

## 2010-12-23 DIAGNOSIS — Z91199 Patient's noncompliance with other medical treatment and regimen due to unspecified reason: Secondary | ICD-10-CM

## 2010-12-23 DIAGNOSIS — Z01810 Encounter for preprocedural cardiovascular examination: Secondary | ICD-10-CM

## 2010-12-23 DIAGNOSIS — T80211A Bloodstream infection due to central venous catheter, initial encounter: Principal | ICD-10-CM | POA: Diagnosis present

## 2010-12-23 DIAGNOSIS — N059 Unspecified nephritic syndrome with unspecified morphologic changes: Secondary | ICD-10-CM | POA: Diagnosis present

## 2010-12-23 DIAGNOSIS — J13 Pneumonia due to Streptococcus pneumoniae: Secondary | ICD-10-CM

## 2010-12-23 HISTORY — PX: LOWER EXTREMITY ANGIOGRAM: SHX5508

## 2010-12-23 HISTORY — PX: ABDOMINAL AORTAGRAM: SHX5454

## 2010-12-23 LAB — CBC
HCT: 30.6 % — ABNORMAL LOW (ref 36.0–46.0)
Hemoglobin: 9.8 g/dL — ABNORMAL LOW (ref 12.0–15.0)
MCH: 32.7 pg (ref 26.0–34.0)
MCHC: 32 g/dL (ref 30.0–36.0)
MCV: 102 fL — ABNORMAL HIGH (ref 78.0–100.0)
Platelets: 236 10*3/uL (ref 150–400)
RBC: 3 MIL/uL — ABNORMAL LOW (ref 3.87–5.11)
RDW: 15.1 % (ref 11.5–15.5)
WBC: 9 10*3/uL (ref 4.0–10.5)

## 2010-12-23 LAB — COMPREHENSIVE METABOLIC PANEL
ALT: 13 U/L (ref 0–35)
AST: 36 U/L (ref 0–37)
Albumin: 2 g/dL — ABNORMAL LOW (ref 3.5–5.2)
Alkaline Phosphatase: 151 U/L — ABNORMAL HIGH (ref 39–117)
BUN: 35 mg/dL — ABNORMAL HIGH (ref 6–23)
CO2: 25 mEq/L (ref 19–32)
Calcium: 7.8 mg/dL — ABNORMAL LOW (ref 8.4–10.5)
Chloride: 94 mEq/L — ABNORMAL LOW (ref 96–112)
Creatinine, Ser: 6.22 mg/dL — ABNORMAL HIGH (ref 0.50–1.10)
GFR calc Af Amer: 8 mL/min — ABNORMAL LOW (ref >90)
GFR calc non Af Amer: 7 mL/min — ABNORMAL LOW (ref >90)
Glucose, Bld: 79 mg/dL (ref 70–99)
Potassium: 3.8 mEq/L (ref 3.5–5.1)
Sodium: 130 mEq/L — ABNORMAL LOW (ref 135–145)
Total Bilirubin: 0.4 mg/dL (ref 0.3–1.2)
Total Protein: 9.2 g/dL — ABNORMAL HIGH (ref 6.0–8.3)

## 2010-12-23 LAB — DIFFERENTIAL
Basophils Absolute: 0 10*3/uL (ref 0.0–0.1)
Basophils Relative: 0 % (ref 0–1)
Eosinophils Absolute: 0 10*3/uL (ref 0.0–0.7)
Eosinophils Relative: 0 % (ref 0–5)
Lymphocytes Relative: 10 % — ABNORMAL LOW (ref 12–46)
Lymphs Abs: 0.9 10*3/uL (ref 0.7–4.0)
Monocytes Absolute: 0.3 10*3/uL (ref 0.1–1.0)
Monocytes Relative: 3 % (ref 3–12)
Neutro Abs: 7.9 10*3/uL — ABNORMAL HIGH (ref 1.7–7.7)
Neutrophils Relative %: 87 % — ABNORMAL HIGH (ref 43–77)

## 2010-12-23 LAB — POCT I-STAT 3, ART BLOOD GAS (G3+)
Acid-Base Excess: 3 mmol/L — ABNORMAL HIGH (ref 0.0–2.0)
Bicarbonate: 26.5 mEq/L — ABNORMAL HIGH (ref 20.0–24.0)
O2 Saturation: 86 %
TCO2: 28 mmol/L (ref 0–100)
pCO2 arterial: 36.6 mmHg (ref 35.0–45.0)
pH, Arterial: 7.468 — ABNORMAL HIGH (ref 7.350–7.400)
pO2, Arterial: 48 mmHg — ABNORMAL LOW (ref 80.0–100.0)

## 2010-12-23 LAB — LACTIC ACID, PLASMA: Lactic Acid, Venous: 0.9 mmol/L (ref 0.5–2.2)

## 2010-12-23 LAB — PROCALCITONIN: Procalcitonin: 2.19 ng/mL

## 2010-12-23 SURGERY — ABDOMINAL AORTAGRAM
Anesthesia: LOCAL

## 2010-12-23 MED ORDER — OXYCODONE-ACETAMINOPHEN 5-325 MG PO TABS
ORAL_TABLET | ORAL | Status: AC
  Administered 2010-12-23: 2 via ORAL
  Filled 2010-12-23: qty 2

## 2010-12-23 MED ORDER — PIPERACILLIN-TAZOBACTAM 3.375 G IVPB
3.3750 g | Freq: Once | INTRAVENOUS | Status: AC
  Administered 2010-12-23 (×2): 3.375 g via INTRAVENOUS
  Filled 2010-12-23: qty 50

## 2010-12-23 MED ORDER — SODIUM CHLORIDE 0.9 % IJ SOLN: 3.0000 mL | INTRAMUSCULAR | Status: DC | PRN

## 2010-12-23 MED ORDER — ACETAMINOPHEN 325 MG PO TABS
650.0000 mg | ORAL_TABLET | Freq: Once | ORAL | Status: AC
  Administered 2010-12-23: 650 mg via ORAL
  Filled 2010-12-23: qty 2

## 2010-12-23 MED ORDER — ACETAMINOPHEN 325 MG PO TABS: 650.0000 mg | ORAL_TABLET | ORAL | Status: DC | PRN

## 2010-12-23 MED ORDER — LIDOCAINE HCL (PF) 1 % IJ SOLN
INTRAMUSCULAR | Status: AC
  Filled 2010-12-23: qty 30

## 2010-12-23 MED ORDER — SODIUM CHLORIDE 0.9 % IV BOLUS (SEPSIS)
1000.0000 mL | Freq: Once | INTRAVENOUS | Status: AC
  Administered 2010-12-23: 1000 mL via INTRAVENOUS

## 2010-12-23 MED ORDER — VANCOMYCIN HCL IN DEXTROSE 1-5 GM/200ML-% IV SOLN
1000.0000 mg | Freq: Once | INTRAVENOUS | Status: AC
  Administered 2010-12-23: 1000 mg via INTRAVENOUS
  Filled 2010-12-23: qty 200

## 2010-12-23 MED ORDER — HEPARIN (PORCINE) IN NACL 2-0.9 UNIT/ML-% IJ SOLN
INTRAMUSCULAR | Status: AC
  Filled 2010-12-23: qty 1000

## 2010-12-23 MED ORDER — OXYCODONE-ACETAMINOPHEN 5-325 MG PO TABS
1.0000 | ORAL_TABLET | ORAL | Status: DC | PRN
  Administered 2010-12-23: 2 via ORAL

## 2010-12-23 MED ORDER — ONDANSETRON HCL 4 MG/2ML IJ SOLN: 4.0000 mg | Freq: Four times a day (QID) | INTRAMUSCULAR | Status: DC | PRN

## 2010-12-23 NOTE — ED Notes (Addendum)
Pt. Awake but not answering appropriately, pt transferred to POD A 1

## 2010-12-23 NOTE — ED Notes (Signed)
02 sats 88% until the nasal 02 added.  Now sats 100% on 2 liters nasal o2

## 2010-12-23 NOTE — Interval H&P Note (Signed)
History and Physical Interval Note:  12/23/2010 10:36 AM  [name]  has presented today for surgery, with the diagnosis of instage renal  The various methods of treatment have been discussed with the patient and family. After consideration of risks, benefits and other options for treatment, the patient has consented to  Procedure(s):ABDOMINAL AORTAGRAM with runoff.  The patients' history has been reviewed, patient examined, no change in status, stable for surgery.  I have reviewed the patients' chart and labs.  Questions were answered to the patient's satisfaction.     DICKSON,CHRISTOPHER S

## 2010-12-23 NOTE — H&P (Signed)
Name: [name] MRN: [id number] DOB: May 16, 1955  LOS: 1  CRITICAL CARE ADMISSION NOTE  History of Present Illness:  55 y/o female with ESRD, HIV and multiple failed dialysis access came to the [facility] ED today with fever, confusion, aching all over.  She underwent a bilateral lower extremity runoff procedure today at [facility] to assess her dialysis access.  She last went to dialysis on Saturday.  After receiving tylenol antibiotics and fluids in the ED she became more awake and alert.  We were consulted for persistant hypotension.  Lines / Drains: 12/23/10 L Fem CVL >>  Cultures / Sepsis markers: 12/23/10 blood cx x2 >> 12/23/10 urine cx >> 12/23/10 procalcitonin >>  Antibiotics: 12/23/10 vanc (HCAP) >> 12/23/10 zosyn (HCAP) >> 12/23/10 cipro (HCAP) >>  Tests / Events: 12/23/10 CXR RLL pneumonia, bilateral heterogeneous opacities     Past Medical History  Diagnosis Date  . Human immunodeficiency virus (HIV) disease   . Hypertension   . ESRD (end stage renal disease)   . Dialysis patient     pt on dialysis since 2010.  [name] Clostridium difficile infection 04/04/10  . Bacteriuria, asymptomatic 04/04/10    Culture grew VRE sensitive to linesolid   . MGUS (monoclonal gammopathy of unknown significance)   . History of bacteremia     MSSA  . Renal insufficiency   . Anemia    Past Surgical History  Procedure Date  . Av fistula placement    Prior to Admission medications   Medication Sig Start Date End Date Taking? Authorizing Provider  acetaminophen (TYLENOL) 650 MG CR tablet Take 650 mg by mouth every 8 (eight) hours as needed. For pain.   Yes Historical Provider, MD  aspirin 81 MG tablet Take 81 mg by mouth daily.     Yes Historical Provider, MD  atazanavir (REYATAZ) 300 MG capsule Take 300 mg by mouth daily with breakfast.     Yes Historical Provider, MD  b complex vitamins tablet Take 1 tablet by mouth daily.     Yes Historical Provider, MD  b complex-vitamin c-folic acid  (NEPHRO-VITE) 0.8 MG TABS Take 0.8 mg by mouth daily.     Yes Historical Provider, MD  darbepoetin (ARANESP, ALBUMIN FREE,) 150 MCG/0.3ML SOLN Inject 150 mcg into the skin. Use weekly for Epogen 20000 units. Dialysis nurse to administer.    Yes Historical Provider, MD  ENSURE (ENSURE) Take 237 mLs by mouth 3 (three) times daily between meals.     Yes Historical Provider, MD  famotidine (PEPCID) 20 MG tablet Take 20 mg by mouth daily.     Yes Historical Provider, MD  iron sucrose (VENOFER) 20 MG/ML injection 100 mg IV on Tuesday HD(HD RN to dispense)    Yes Historical Provider, MD  lamiVUDine (EPIVIR) 10 MG/ML solution Take 1 teaspoon by mouth every day.    Yes Historical Provider, MD  paricalcitol (ZEMPLAR) 5 MCG/ML injection 4 micrograms IV- dispensed by dialysis nurse.    Yes Historical Provider, MD  ritonavir (NORVIR) 100 MG TABS Take 100 mg by mouth daily with breakfast.    Yes Historical Provider, MD  Stavudine (ZERIT) 1 MG/ML SOLR Take 4 teaspoons(20 ml) by mouth every day.    Yes Historical Provider, MD  sulfamethoxazole-trimethoprim (BACTRIM DS) 800-160 MG per tablet Take 1 tablet by mouth daily. Take 1 tablet by mouth every Monday, Wednesday and Friday.   Yes Historical Provider, MD   Allergies  Allergen Reactions  . Penicillins     Unknown reaction  Family History  Problem Relation Age of Onset  . Alcohol abuse      family h/o addiction/alcoholism  . Diabetes Cousin     first degree relatives  . Kidney disease Mother   . Kidney disease Maternal Uncle   . Cancer Sister    Social History  reports that she has never smoked. She does not have any smokeless tobacco history on file. She reports that she does not drink alcohol or use illicit drugs.  Review Of Systems   10 point ROS and neg as per HPI  Vital Signs:   Filed Vitals:   12/23/10 2241 12/23/10 2248 12/23/10 2316 12/23/10 2356  BP: 84/40 81/41 72/39  82/41  Pulse: 102  99 95  Temp:  104.1 F (40.1 C)    TempSrc:   Rectal    Resp: 24 26  28   SpO2: 98% 98% 100% 100%     Physical Examination: Gen: chronically ill appearing, achy, no respiratory distress HEENT: NCAT, PERRL, EOMi, MM dry  Neck: supple without masses PULM: Insp crackles in bases bilaterally CV: tachy, regular, loud systolic murmur at apex, JVD noted AB: BS+, soft, nontender, no hsm Ext: warm, no edema, no clubbing, no cyanosis, R groin with bandage in place, c/d/i Derm: no rash or skin breakdown Neuro: A&Ox4, CN II-XII intact, strength 5/5 in all 4 extremities Psyche: Normal mood and affect  Labs and Imaging:   CBC    Component Value Date/Time   WBC 9.0 12/23/2010 2021   RBC 3.00* 12/23/2010 2021   HGB 9.8* 12/23/2010 2021   HCT 30.6* 12/23/2010 2021   PLT 236 12/23/2010 2021   MCV 102.0* 12/23/2010 2021   MCH 32.7 12/23/2010 2021   MCHC 32.0 12/23/2010 2021   RDW 15.1 12/23/2010 2021   LYMPHSABS 0.9 12/23/2010 2021   MONOABS 0.3 12/23/2010 2021   EOSABS 0.0 12/23/2010 2021   BASOSABS 0.0 12/23/2010 2021    BMET    Component Value Date/Time   NA 130* 12/23/2010 2021   K 3.8 12/23/2010 2021   CL 94* 12/23/2010 2021   CO2 25 12/23/2010 2021   GLUCOSE 79 12/23/2010 2021   BUN 35* 12/23/2010 2021   CREATININE 6.22* 12/23/2010 2021   CALCIUM 7.8* 12/23/2010 2021   CALCIUM 7.6* 09/14/2007 1321   GFRNONAA 7* 12/23/2010 2021   GFRAA 8* 12/23/2010 2021     Assessment and Plan: 55 y/o female presents with one day of achiness, confusion, fever, and evidence of a RLL pneumonia on CXR.  She has septic shock from HCAP.  DDx also includes influenza or other opportunistic respiratory infection.  Favor HCAP.  Very limited IV access.  Septic shock (12/24/2010)   Assessment: Due to health care associated pneumonia   Plan:  -sepsis protocol, modified for ESRD and cannot measure CVP because only has femoral line -levophed -consider early hydrocortisone given HIV -pan culture -vanc/zosyn (pcn allergy but tolerated in  ED)/cipro -f/u flu swab -on tamiflu given flu like illness, stop if flu swab negative  HIV INFECTION (03/19/2007)   Assessment: continue home meds, consider opportunistic infection if does not respond to typical HCAP coverage   Plan:  -as above  ANEMIA OF CHRONIC DISEASE (03/19/2007)   Assessment: Hct up today from baseline, likely concentrated   Plan:  -monitor cbc  THROMBOCYTOPENIA (05/02/2009)   Assessment: normal on today's lab   Plan:  -monitor cbc  CONGESTIVE CARDIOMYOPATHY (03/19/2007)   Assessment: slightly volume up now   Plan:  -monitor volume status closely,  limit crystalloid  CHRONIC KIDNEY DISEASE STAGE IV (SEVERE) (03/19/2007)   Assessment:    Plan:  -renal consult in AM   Pneumonia (12/24/2010)   Assessment: as above   Plan: as above  Code Full  Best practices / Disposition:  Feeding/protein malnutrition: renal diet Analgesia:  Sedation: n/a Thromboprophylaxis: n/a HOB >30 degrees  Ulcer prophylaxis: famotidine Glucose control/hyperglycemia: monitor cbg  The patient is critically ill with multiple organ systems failure and requires high complexity decision making for assessment and support, frequent evaluation and titration of therapies, application of advanced monitoring technologies and extensive interpretation of multiple databases. Critical Care Time devoted to patient care services described in this note is 80 minutes.  Roselie Awkward, M.D. Pulmonary and [place] Pager: [phone]

## 2010-12-23 NOTE — ED Notes (Signed)
Central line placed lt groin.  Small bandage to the rt groin from ?? Her surgery earlier today.  The pt tolerated the central line placement very well.  The pt is more alert for the past hour.  ?? Temp down.  bp still low.  Her daughter has still not returned.

## 2010-12-23 NOTE — ED Provider Notes (Signed)
History     CSN: [id number] Arrival date & time: 12/23/2010  7:07 PM   First MD Initiated Contact with Patient 12/23/10 1933      Chief Complaint  Patient presents with  . Incisional Pain    (Consider location/radiation/quality/duration/timing/severity/associated sxs/prior treatment) HPI Comments: Patient here from home with complaint of fever - she has altered mental status, so I have obtained history from medical chart - she was seen here this morning for a right femoral vascular procedure - she presents with fever, low oxygen sats  - review of chart also reveal a history of MSSA bacteremia with admission  In September.  She is lethargic and hot to the touch - due to this we have called a code sepsis.  Patient is a 55 y.o. female presenting with fever. The history is provided by the patient. The history is limited by the condition of the patient. No language interpreter was used.  Fever Primary symptoms of the febrile illness include fever, cough and altered mental status. Primary symptoms do not include shortness of breath, abdominal pain, nausea, vomiting or diarrhea. The current episode started today. This is a new problem.  Risk factors for febrile illness include implanted device, HIV and immunodeficiency.   Past Medical History  Diagnosis Date  . Human immunodeficiency virus (HIV) disease   . Hypertension   . ESRD (end stage renal disease)   . Dialysis patient     pt on dialysis since 2010.  [name] Clostridium difficile infection 04/04/10  . Bacteriuria, asymptomatic 04/04/10    Culture grew VRE sensitive to linesolid   . MGUS (monoclonal gammopathy of unknown significance)   . History of bacteremia     MSSA  . Renal insufficiency   . Anemia     Past Surgical History  Procedure Date  . Av fistula placement     Family History  Problem Relation Age of Onset  . Alcohol abuse      family h/o addiction/alcoholism  . Diabetes Cousin     first degree relatives  . Kidney  disease Mother   . Kidney disease Maternal Uncle   . Cancer Sister     History  Substance Use Topics  . Smoking status: Never Smoker   . Smokeless tobacco: Not on file  . Alcohol Use: No    OB History    Grav Para Term Preterm Abortions TAB SAB Ect Mult Living                  Review of Systems  Unable to perform ROS: Mental status change  Constitutional: Positive for fever.  Respiratory: Positive for cough. Negative for shortness of breath.   Gastrointestinal: Negative for nausea, vomiting, abdominal pain and diarrhea.  Psychiatric/Behavioral: Positive for altered mental status.    Allergies  Penicillins  Home Medications   Current Outpatient Rx  Name Route Sig Dispense Refill  . ACETAMINOPHEN ER 650 MG PO TBCR Oral Take 650 mg by mouth every 8 (eight) hours as needed.     . ASPIRIN 81 MG PO TABS Oral Take 81 mg by mouth daily.      . ATAZANAVIR SULFATE 300 MG PO CAPS Oral Take 300 mg by mouth daily with breakfast.      . B COMPLEX PO TABS Oral Take 1 tablet by mouth daily.      [name] NEPHRO-VITE 0.8 MG PO TABS Oral Take 0.8 mg by mouth daily.      [name] DARBEPOETIN ALFA-POLYSORBATE 150 MCG/0.3ML IJ SOLN  Subcutaneous Inject 150 mcg into the skin. Use weekly for Epogen 20000 units. Dialysis nurse to administer.     [name] ENSURE PO LIQD Oral Take 237 mLs by mouth 3 (three) times daily between meals.      [name] FAMOTIDINE 20 MG PO TABS Oral Take 20 mg by mouth daily.      . IRON SUCROSE 20 MG/ML IV SOLN  100 mg IV on Tuesday HD(HD RN to dispense)     . LAMIVUDINE 10 MG/ML PO SOLN  Take 1 teaspoon by mouth every day.     [name] PARICALCITOL 5 MCG/ML IV SOLN  4 micrograms IV- dispensed by dialysis nurse.     [name] RITONAVIR 100 MG PO TABS Oral Take 100 mg by mouth daily with breakfast.     . STAVUDINE 1 MG/ML PO SOLR  Take 4 teaspoons(20 ml) by mouth every day.     . SULFAMETHOXAZOLE-TRIMETHOPRIM 800-160 MG PO TABS  1 tablet. Take 1 tablet by mouth every Monday, Wednesday and Friday.     [name]  VANCOMYCIN 50 MG/ML ORAL SOLUTION Oral Take by mouth every 6 (six) hours. 500 mg po Q6H for 10 days.       BP 103/58  Pulse 124  Temp(Src) 104.6 F (40.3 C) (Rectal)  Resp 16  SpO2 90%  Physical Exam  Nursing note and vitals reviewed. Constitutional: She appears well-developed and well-nourished. She appears lethargic. She appears toxic. She has a sickly appearance. She appears distressed.  HENT:  Head: Normocephalic and atraumatic.  Mouth/Throat: Oropharynx is clear and moist. No oropharyngeal exudate.  Eyes: Conjunctivae are normal. Pupils are equal, round, and reactive to light. No scleral icterus.  Neck: Normal range of motion. Neck supple. No JVD present.  Cardiovascular: Regular rhythm and normal heart sounds.  Tachycardia present.  Exam reveals no gallop and no friction rub.   No murmur heard. Pulmonary/Chest: Tachypnea noted. She has no wheezes. She has rales in the right lower field and the left lower field.  Abdominal: Soft. Bowel sounds are normal. She exhibits no distension. There is no tenderness.  Musculoskeletal: Normal range of motion.       Left subclavian Hickman cath - no redness at the site  Right femoral puncture wound without drainage/redness  Lymphadenopathy:    She has no cervical adenopathy.  Neurological: She has normal strength. She appears lethargic.  Skin: Skin is warm and dry.    ED Course  Procedures (including critical care time)  Labs Reviewed  POCT I-STAT 3, BLOOD GAS (G3+) - Abnormal; Notable for the following:    pH, Arterial 7.468 (*)    pO2, Arterial 48.0 (*)    Bicarbonate 26.5 (*)    Acid-Base Excess 3.0 (*)    All other components within normal limits  CULTURE, BLOOD (ROUTINE X 2)  CULTURE, BLOOD (ROUTINE X 2)  CBC  DIFFERENTIAL  COMPREHENSIVE METABOLIC PANEL  LACTIC ACID, PLASMA  PROCALCITONIN  URINALYSIS, ROUTINE W REFLEX MICROSCOPIC  URINE CULTURE  BLOOD GAS, ARTERIAL   Dg Chest 2 View  12/23/2010  *RADIOLOGY REPORT*   Clinical Data: Fever, confusion, hypoxia  CHEST - 2 VIEW  Comparison: 09/26/2010  Findings: Right lower lobe opacity, suspicious for pneumonia. No pleural effusion or pneumothorax.  Stable cardiomegaly.  Stable left-sided dual lumen dialysis catheter.  Mild degenerative changes of the visualized thoracolumbar spine.  IMPRESSION: Right lower lobe opacity, suspicious for pneumonia.  Stable cardiomegaly.  Original Report Authenticated By: Julian Hy, M.D.   CRITICAL CARE Performed by: Ignacia Felling  C.   Total critical care time: 60  Critical care time was exclusive of separately billable procedures and treating other patients.  Critical care was necessary to treat or prevent imminent or life-threatening deterioration.  Critical care was time spent personally by me on the following activities: development of treatment plan with patient and/or surrogate as well as nursing, discussions with consultants, evaluation of patient's response to treatment, examination of patient, obtaining history from patient or surrogate, ordering and performing treatments and interventions, ordering and review of laboratory studies, ordering and review of radiographic studies, pulse oximetry and re-evaluation of patient's condition.  RLL CAP     MDM  Patient with fever, decreased mental status, tachycardia and moderately low blood pressure - we have called a code sepsis and Dr. Stark Jock spoke with PCCM who will send the MD from [facility] to see the patient.  Chest x-ray reveals a RLL PNA - started on vancomycin and zosyn. Gas does not show acidosis, but remains febrile      Idalia Needle. [place], [place] 12/23/10 2356

## 2010-12-23 NOTE — Op Note (Signed)
[name]      [id number] DOB: 02-16-1955     DATE OF PROCEDURE: 12/23/2010  PREOP DIAGNOSIS: Chronic kidney disease stage V  POSTOP DIAGNOSIS: Same  PROCEDURE: Ultrasound-guided access to right common femoral artery, aortogram with bilateral lower extremity runoff  SURGEON: Judeth Cornfield. Scot Dock, MD, FACS  ANESTHESIA: local   EBL: minimal  INDICATIONS: [name] is a 55 y.o. female who is on hemodialysis and needs a right thigh AV graft. She had diminished pedal pulses and arteriography was recommended preoperatively prior to placing a right thigh AV graft.  TECHNIQUE: The patient was brought to the PD lab and he right groin was prepped and draped in the usual sterile fashion. After the skin was anesthetized with 1% lidocaine, and under ultrasound guidance, the right common femoral artery was cannulated and a guidewire introduced into the infrarenal aorta. A 5 French sheath was introduced over the wire. Pigtail catheter was positioned at the L1 vertebral body and flush aortogram obtained. The catheter was then brought down above the bifurcation and bilateral lower extremity runoff films obtained. After the pigtail catheter was removed over a wire and additional spot films were obtained of the right leg. At the completion of the procedure the femoral sheath was exchanged for the Perclose device over a wire. The Perclose device was advanced over the wire and the wire removed. The catheter was then advanced further until the was good return from the marker port. The foot was then deployed the catheter retracted and the needle engaged without difficulty. The device was then retracted and the suture tied. There was good hemostasis.  FINDINGS:  1. No significant aortoiliac occlusive disease. 2. Patent right superficial femoral artery deep femoral artery common femoral artery. There is 2 vessel runoff on the right via the posterior tibial and peroneal arteries. The right anterior tibial artery  is not visualized. 3. On the left side, the common femoral, superficial femoral, deep femoral artery, popliteal and tibial vessels are patent. There is three-vessel runoff on the left via the anterior tibial, posterior tibial, and peroneal arteries.  [name], MD, FACS [facility]: 12/23/2010 DATE OF DICTATION: 12/23/2010

## 2010-12-23 NOTE — ED Notes (Signed)
The pt has finished her zosyn  Vancomycin hung.  Pt given tyleno she says she is aching all over her body

## 2010-12-23 NOTE — ED Notes (Signed)
Vancomycin has infused

## 2010-12-23 NOTE — ED Notes (Signed)
PA and RN made aware of pt's critical vital signs

## 2010-12-23 NOTE — ED Notes (Signed)
She has discoloration to the lower lt leg the daughter says it is normal for her

## 2010-12-23 NOTE — ED Notes (Signed)
The pt does not make urine for a very long time

## 2010-12-23 NOTE — ED Notes (Signed)
Cc doctor here to see.  Central line being placed

## 2010-12-23 NOTE — ED Notes (Signed)
The pts daughter has left to do something she said she would return

## 2010-12-23 NOTE — ED Notes (Signed)
The pt was dialyzed last this past Saturday..  Her daughter has just arrived and is able  To tell me that the pt had outpatient surgery this am here in short stay to place a dialysis shunt in one of her legs.  i do not see one.  She is getting dialysis through the dialysis catheter.  She had bee feelin bad and c/o pain everywhere since this afternoon and that was when she was brought in by [facility].

## 2010-12-23 NOTE — ED Notes (Signed)
Pt not talking she has no iv access.  She has a dialysis catheter in her lt chest.  She opens her eyes and just grunts.  Iv team called to start iv

## 2010-12-23 NOTE — H&P (View-Only) (Signed)
[facility]  Patient name: [name] MRN: [id number] DOB: 02/24/1955 Sex: female  REASON FOR CONSULT: Evaluate for new hemodialysis access.  HPI: [name] is a 55 y.o. female who has a complicated access history.  1. In October 2009 she had a left upper arm AV graft placed. She was noted at that time to have a high bifurcation of her brachial artery she later presented with venous hypertension and massive edema of her left upper arm had removal of the graft to repair of the axillary artery with a vein patch. This apparently bled and was replaced with a bovine pericardial patch. 2. She had dehiscence of this and required left axillary artery to radial artery bypass in November of 2009. 3. Of note in January 2010 she had incision and drainage of 2 large lymphoceles her right side and require placement of a VAC.  4. In May of 2010 she had a right upper arm AV fistula placed which ultimately failed.  5. She had a new right upper arm graft placed in June of 2010 and has had multiple thrombectomies of her graft.  6. In September 2012 she had removal of and suspected infected AV graft of the right upper arm and primary repair of the axillary artery. She had a left IJ catheter placed in September.  Based on my review of her records it does not appear that she is a candidate for a basilic vein transposition in either arm. She's failed previous fistula creation on the right and I do not see any further options for upper extremity access.  Past Medical History  Diagnosis Date  . Human immunodeficiency virus (HIV) disease   . Hypertension   . ESRD (end stage renal disease)   . Dialysis patient     pt on dialysis since 2010.  [name] Clostridium difficile infection 04/04/10  . Bacteriuria, asymptomatic 04/04/10    Culture grew VRE sensitive to linesolid   . MGUS (monoclonal gammopathy of unknown significance)   . History of bacteremia     MSSA  . Renal insufficiency   .  Anemia     Family History  Problem Relation Age of Onset  . Alcohol abuse      family h/o addiction/alcoholism  . Diabetes Cousin     first degree relatives  . Kidney disease Mother   . Kidney disease Maternal Uncle   . Cancer Sister     SOCIAL HISTORY: History  Substance Use Topics  . Smoking status: Never Smoker   . Smokeless tobacco: Not on file  . Alcohol Use: No    No Known Allergies  Current Outpatient Prescriptions  Medication Sig Dispense Refill  . acetaminophen (TYLENOL) 650 MG CR tablet Take 650 mg by mouth every 8 (eight) hours as needed.        [name] aspirin 81 MG tablet Take 81 mg by mouth daily.        [name] atazanavir (REYATAZ) 300 MG capsule Take 300 mg by mouth daily with breakfast.        . b complex vitamins tablet Take 1 tablet by mouth daily.        [name] b complex-vitamin c-folic acid (NEPHRO-VITE) 0.8 MG TABS Take 0.8 mg by mouth daily.        . darbepoetin (ARANESP, ALBUMIN FREE,) 150 MCG/0.3ML SOLN Inject 150 mcg into the skin. Use weekly for Epogen 20000 units. Dialysis nurse to administer.       [name] ENSURE (ENSURE)  Take 237 mLs by mouth 3 (three) times daily between meals.        . famotidine (PEPCID) 20 MG tablet Take 20 mg by mouth daily.        . iron sucrose (VENOFER) 20 MG/ML injection 100 mg IV on Tuesday HD(HD RN to dispense)       . lamiVUDine (EPIVIR) 10 MG/ML solution Take 1 teaspoon by mouth every day.       . paricalcitol (ZEMPLAR) 5 MCG/ML injection 4 micrograms IV- dispensed by dialysis nurse.       . ritonavir (NORVIR) 100 MG TABS Take 100 mg by mouth daily with breakfast.        . Stavudine (ZERIT) 1 MG/ML SOLR Take 4 teaspoons(20 ml) by mouth every day.       . sulfamethoxazole-trimethoprim (BACTRIM DS) 800-160 MG per tablet 1 tablet. Take 1 tablet by mouth every Monday, Wednesday and Friday.       . vancomycin (VANCOCIN) 50 mg/mL oral solution Take by mouth every 6 (six) hours. 500 mg po Q6H for 10 days.         REVIEW OF SYSTEMS: Valu.Nieves ] denotes  positive finding; [  ] denotes negative finding CARDIOVASCULAR:  [ ]  chest pain   [ ]  chest pressure   [ ]  palpitations   [ ]  orthopnea   [ ]  dyspnea on exertion   [ ]  claudication   [ ]  rest pain   [ ]  DVT   [ ]  phlebitis PULMONARY:   [ ]  productive cough   [ ]  asthma   [ ]  wheezing NEUROLOGIC:   [ ]  weakness  [ ]  paresthesias  [ ]  aphasia  [ ]  amaurosis  [ ]  dizziness HEMATOLOGIC:   [ ]  bleeding problems   [ ]  clotting disorders MUSCULOSKELETAL:  [ ]  joint pain   [ ]  joint swelling Valu.Nieves ] leg swelling L>R GASTROINTESTINAL: [ ]   blood in stool  [ ]   hematemesis GENITOURINARY:  [ ]   dysuria  [ ]   hematuria PSYCHIATRIC:  [ ]  history of major depression INTEGUMENTARY:  [ ]  rashes  [ ]  ulcers CONSTITUTIONAL:  [ ]  fever   [ ]  chills  PHYSICAL EXAM: Filed Vitals:   12/11/10 1105  BP: 108/66  Pulse: 67  Resp: 16  Height: 5\' 9"  (1.753 m)  Weight: 133 lb (60.328 kg)  SpO2: 100%   Body mass index is 19.64 kg/(m^2). GENERAL: The patient is a well-nourished female, in no acute distress. The vital signs are documented above. CARDIOVASCULAR: There is a regular rate and rhythm without significant murmur appreciated.  PULMONARY: There is good air exchange bilaterally without wheezing or rales. She has a weakly palpable radial pulse bilaterally. She has palpable femoral pulses bilaterally. She has a diminished right dorsalis pedis pulse a palpable left dorsalis pedis pulse. Significant lymphedema the left leg with lesser degree of lymphedema in the right leg.  DATA:  Lab Results  Component Value Date   WBC 3.3* 09/30/2010   HGB 9.0* 09/30/2010   HCT 28.0* 09/30/2010   MCV 99.3 09/30/2010   PLT 151 09/30/2010   Lab Results  Component Value Date   NA 135 09/30/2010   K 4.0 09/30/2010   CL 101 09/30/2010   CO2 29 09/30/2010   Lab Results  Component Value Date   CREATININE 4.57* 09/30/2010   Lab Results  Component Value Date   INR 1.50* 09/11/2010   INR 1.71* 09/09/2010   INR 1.26 05/25/2010  MEDICAL ISSUES: I think your Digital access option is a thigh graft. Given the significant lymphedema she has on the left I would be reluctant to place a thigh graft on the left as this may worsen her swelling. Her pulse on the right is slightly diminished. I recommend that we proceed with an arteriogram in order to further assess her circulation on the right. I picked a place to graft in and have her developed a limb threatening situation appeared. Her arteriogram is scheduled for December 17. Pending these results we can potentially schedule her for a right thigh AV graft. She may need preoperative ABIs. He dialyzes on Tuesdays Thursdays and Saturdays. This patient does have a history of HIV.  [facility] [place] [facility]: [phone]

## 2010-12-23 NOTE — ED Notes (Signed)
Pt to department from home c/o left chest incisional pain after having a dialysis port placed. Pt A&Ox4 on arrival. CBG- 68. Bp- 110/72 Hr- 124.

## 2010-12-24 ENCOUNTER — Inpatient Hospital Stay (HOSPITAL_COMMUNITY): Payer: Medicaid Other

## 2010-12-24 ENCOUNTER — Other Ambulatory Visit: Payer: Self-pay

## 2010-12-24 ENCOUNTER — Emergency Department (HOSPITAL_COMMUNITY): Payer: Medicaid Other

## 2010-12-24 ENCOUNTER — Encounter (HOSPITAL_COMMUNITY): Payer: Self-pay | Admitting: *Deleted

## 2010-12-24 DIAGNOSIS — R7881 Bacteremia: Secondary | ICD-10-CM

## 2010-12-24 DIAGNOSIS — A419 Sepsis, unspecified organism: Secondary | ICD-10-CM

## 2010-12-24 DIAGNOSIS — N186 End stage renal disease: Secondary | ICD-10-CM

## 2010-12-24 DIAGNOSIS — R6521 Severe sepsis with septic shock: Secondary | ICD-10-CM

## 2010-12-24 DIAGNOSIS — B20 Human immunodeficiency virus [HIV] disease: Secondary | ICD-10-CM

## 2010-12-24 LAB — PROCALCITONIN: Procalcitonin: 11.35 ng/mL

## 2010-12-24 LAB — CBC
HCT: 28.2 % — ABNORMAL LOW (ref 36.0–46.0)
HCT: 29.3 % — ABNORMAL LOW (ref 36.0–46.0)
Hemoglobin: 8.9 g/dL — ABNORMAL LOW (ref 12.0–15.0)
Hemoglobin: 9.3 g/dL — ABNORMAL LOW (ref 12.0–15.0)
MCH: 32.2 pg (ref 26.0–34.0)
MCH: 32.7 pg (ref 26.0–34.0)
MCHC: 31.6 g/dL (ref 30.0–36.0)
MCHC: 31.7 g/dL (ref 30.0–36.0)
MCV: 102.2 fL — ABNORMAL HIGH (ref 78.0–100.0)
MCV: 103.2 fL — ABNORMAL HIGH (ref 78.0–100.0)
Platelets: 209 10*3/uL (ref 150–400)
Platelets: 230 10*3/uL (ref 150–400)
RBC: 2.76 MIL/uL — ABNORMAL LOW (ref 3.87–5.11)
RBC: 2.84 MIL/uL — ABNORMAL LOW (ref 3.87–5.11)
RDW: 15.3 % (ref 11.5–15.5)
RDW: 15.5 % (ref 11.5–15.5)
WBC: 13.3 10*3/uL — ABNORMAL HIGH (ref 4.0–10.5)
WBC: 14.7 10*3/uL — ABNORMAL HIGH (ref 4.0–10.5)

## 2010-12-24 LAB — PRO B NATRIURETIC PEPTIDE: Pro B Natriuretic peptide (BNP): 67087 pg/mL — ABNORMAL HIGH (ref 0–125)

## 2010-12-24 LAB — COMPREHENSIVE METABOLIC PANEL
ALT: 11 U/L (ref 0–35)
AST: 36 U/L (ref 0–37)
Albumin: 1.8 g/dL — ABNORMAL LOW (ref 3.5–5.2)
Alkaline Phosphatase: 144 U/L — ABNORMAL HIGH (ref 39–117)
BUN: 37 mg/dL — ABNORMAL HIGH (ref 6–23)
CO2: 22 mEq/L (ref 19–32)
Calcium: 7.5 mg/dL — ABNORMAL LOW (ref 8.4–10.5)
Chloride: 98 mEq/L (ref 96–112)
Creatinine, Ser: 6.37 mg/dL — ABNORMAL HIGH (ref 0.50–1.10)
GFR calc Af Amer: 8 mL/min — ABNORMAL LOW (ref >90)
GFR calc non Af Amer: 7 mL/min — ABNORMAL LOW (ref >90)
Glucose, Bld: 70 mg/dL (ref 70–99)
Potassium: 3.6 mEq/L (ref 3.5–5.1)
Sodium: 133 mEq/L — ABNORMAL LOW (ref 135–145)
Total Bilirubin: 0.5 mg/dL (ref 0.3–1.2)
Total Protein: 8.6 g/dL — ABNORMAL HIGH (ref 6.0–8.3)

## 2010-12-24 LAB — PROTIME-INR
INR: 1.32 (ref 0.00–1.49)
Prothrombin Time: 16.6 seconds — ABNORMAL HIGH (ref 11.6–15.2)

## 2010-12-24 LAB — LACTIC ACID, PLASMA: Lactic Acid, Venous: 1.2 mmol/L (ref 0.5–2.2)

## 2010-12-24 LAB — POCT I-STAT, CHEM 8
BUN: 33 mg/dL — ABNORMAL HIGH (ref 6–23)
Calcium, Ion: 1.08 mmol/L — ABNORMAL LOW (ref 1.12–1.32)
Chloride: 98 mEq/L (ref 96–112)
Creatinine, Ser: 4.8 mg/dL — ABNORMAL HIGH (ref 0.50–1.10)
Glucose, Bld: 75 mg/dL (ref 70–99)
HCT: 37 % (ref 36.0–46.0)
Hemoglobin: 12.6 g/dL (ref 12.0–15.0)
Potassium: 3.8 mEq/L (ref 3.5–5.1)
Sodium: 137 mEq/L (ref 135–145)
TCO2: 32 mmol/L (ref 0–100)

## 2010-12-24 LAB — BASIC METABOLIC PANEL
BUN: 39 mg/dL — ABNORMAL HIGH (ref 6–23)
CO2: 24 mEq/L (ref 19–32)
Calcium: 7.5 mg/dL — ABNORMAL LOW (ref 8.4–10.5)
Chloride: 95 mEq/L — ABNORMAL LOW (ref 96–112)
Creatinine, Ser: 6.72 mg/dL — ABNORMAL HIGH (ref 0.50–1.10)
GFR calc Af Amer: 7 mL/min — ABNORMAL LOW (ref >90)
GFR calc non Af Amer: 6 mL/min — ABNORMAL LOW (ref >90)
Glucose, Bld: 90 mg/dL (ref 70–99)
Potassium: 3.5 mEq/L (ref 3.5–5.1)
Sodium: 130 mEq/L — ABNORMAL LOW (ref 135–145)

## 2010-12-24 LAB — CORTISOL: Cortisol, Plasma: 17.6 ug/dL

## 2010-12-24 LAB — INFLUENZA PANEL BY PCR (TYPE A & B)
H1N1 flu by pcr: NOT DETECTED
Influenza A By PCR: NEGATIVE
Influenza B By PCR: NEGATIVE

## 2010-12-24 LAB — TYPE AND SCREEN
ABO/RH(D): O POS
Antibody Screen: NEGATIVE

## 2010-12-24 LAB — CARDIAC PANEL(CRET KIN+CKTOT+MB+TROPI)
CK, MB: 1.2 ng/mL (ref 0.3–4.0)
CK, MB: 1.5 ng/mL (ref 0.3–4.0)
Relative Index: INVALID (ref 0.0–2.5)
Relative Index: 1.5 (ref 0.0–2.5)
Total CK: 89 U/L (ref 7–177)
Total CK: 101 U/L (ref 7–177)
Troponin I: <0.3 ng/mL (ref <0.30)
Troponin I: <0.3 ng/mL (ref <0.30)

## 2010-12-24 LAB — LIPASE, BLOOD: Lipase: 64 U/L — ABNORMAL HIGH (ref 11–59)

## 2010-12-24 LAB — APTT: aPTT: 39 seconds — ABNORMAL HIGH (ref 24–37)

## 2010-12-24 LAB — AMYLASE: Amylase: 357 U/L — ABNORMAL HIGH (ref 0–105)

## 2010-12-24 LAB — FIBRINOGEN: Fibrinogen: 218 mg/dL (ref 204–475)

## 2010-12-24 LAB — MRSA PCR SCREENING: MRSA by PCR: NEGATIVE

## 2010-12-24 MED ORDER — VASOPRESSIN 20 UNIT/ML IJ SOLN
0.0300 [IU]/min | INTRAVENOUS | Status: DC | PRN
  Administered 2010-12-24: 0.03 [IU]/min via INTRAVENOUS
  Filled 2010-12-24: qty 2.5

## 2010-12-24 MED ORDER — SORBITOL 70 % SOLN
30.0000 mL | Status: DC | PRN
  Filled 2010-12-24: qty 30

## 2010-12-24 MED ORDER — SODIUM CHLORIDE 0.9 % IJ SOLN: 3.0000 mL | Freq: Two times a day (BID) | INTRAMUSCULAR | Status: DC

## 2010-12-24 MED ORDER — SODIUM CHLORIDE 0.9 % IV SOLN: 100.0000 mL | INTRAVENOUS | Status: DC | PRN

## 2010-12-24 MED ORDER — CIPROFLOXACIN IN D5W 400 MG/200ML IV SOLN
400.0000 mg | INTRAVENOUS | Status: DC
  Administered 2010-12-24: 400 mg via INTRAVENOUS
  Filled 2010-12-24: qty 200

## 2010-12-24 MED ORDER — OSELTAMIVIR PHOSPHATE 75 MG PO CAPS
75.0000 mg | ORAL_CAPSULE | Freq: Every day | ORAL | Status: DC
  Filled 2010-12-24: qty 1

## 2010-12-24 MED ORDER — NOREPINEPHRINE BITARTRATE 1 MG/ML IJ SOLN
5.0000 ug/min | INTRAVENOUS | Status: DC
  Administered 2010-12-24: 5 ug/min via INTRAVENOUS
  Filled 2010-12-24: qty 8

## 2010-12-24 MED ORDER — CEFAZOLIN SODIUM-DEXTROSE 2-3 GM-% IV SOLR
2.0000 g | INTRAVENOUS | Status: DC
  Administered 2010-12-24 – 2010-12-28 (×3): 2 g via INTRAVENOUS
  Filled 2010-12-24 (×3): qty 50

## 2010-12-24 MED ORDER — B COMPLEX PO TABS: 1.0000 | ORAL_TABLET | Freq: Every day | ORAL | Status: DC

## 2010-12-24 MED ORDER — SODIUM CHLORIDE 0.9 % IV SOLN
250.0000 mL | INTRAVENOUS | Status: DC | PRN
  Administered 2010-12-30: 07:00:00 via INTRAVENOUS

## 2010-12-24 MED ORDER — ASPIRIN EC 81 MG PO TBEC
81.0000 mg | DELAYED_RELEASE_TABLET | Freq: Every day | ORAL | Status: DC
  Administered 2010-12-24 – 2010-12-31 (×7): 81 mg via ORAL
  Filled 2010-12-24 (×9): qty 1

## 2010-12-24 MED ORDER — HYDROXYZINE HCL 25 MG PO TABS
25.0000 mg | ORAL_TABLET | Freq: Three times a day (TID) | ORAL | Status: DC | PRN
  Filled 2010-12-24: qty 1

## 2010-12-24 MED ORDER — VANCOMYCIN HCL IN DEXTROSE 1-5 GM/200ML-% IV SOLN: 1000.0000 mg | Freq: Once | INTRAVENOUS | Status: DC

## 2010-12-24 MED ORDER — B COMPLEX-C PO TABS
1.0000 | ORAL_TABLET | Freq: Every day | ORAL | Status: DC
  Administered 2010-12-24 – 2010-12-31 (×8): 1 via ORAL
  Filled 2010-12-24 (×9): qty 1

## 2010-12-24 MED ORDER — SULFAMETHOXAZOLE-TMP DS 800-160 MG PO TABS
1.0000 | ORAL_TABLET | ORAL | Status: DC
  Administered 2010-12-25 – 2010-12-30 (×3): 1 via ORAL
  Filled 2010-12-24 (×3): qty 1

## 2010-12-24 MED ORDER — SODIUM CHLORIDE 0.9 % IV SOLN
250.0000 mL | INTRAVENOUS | Status: DC | PRN
  Administered 2010-12-24: 250 mL via INTRAVENOUS

## 2010-12-24 MED ORDER — CAMPHOR-MENTHOL 0.5-0.5 % EX LOTN
1.0000 "application " | TOPICAL_LOTION | Freq: Three times a day (TID) | CUTANEOUS | Status: DC | PRN
  Filled 2010-12-24: qty 222

## 2010-12-24 MED ORDER — HEPARIN SODIUM (PORCINE) 1000 UNIT/ML DIALYSIS
20.0000 [IU]/kg | INTRAMUSCULAR | Status: DC | PRN
  Administered 2010-12-24: 1300 [IU] via INTRAVENOUS_CENTRAL
  Filled 2010-12-24: qty 2

## 2010-12-24 MED ORDER — HEPARIN SODIUM (PORCINE) 5000 UNIT/ML IJ SOLN
5000.0000 [IU] | Freq: Three times a day (TID) | INTRAMUSCULAR | Status: DC
  Administered 2010-12-24 – 2010-12-31 (×20): 5000 [IU] via SUBCUTANEOUS
  Filled 2010-12-24 (×26): qty 1

## 2010-12-24 MED ORDER — VANCOMYCIN HCL 500 MG IV SOLR
500.0000 mg | Freq: Once | INTRAVENOUS | Status: AC
  Administered 2010-12-24: 500 mg via INTRAVENOUS
  Filled 2010-12-24: qty 500

## 2010-12-24 MED ORDER — HEPARIN SODIUM (PORCINE) 1000 UNIT/ML DIALYSIS
1000.0000 [IU] | INTRAMUSCULAR | Status: DC | PRN
  Filled 2010-12-24: qty 1

## 2010-12-24 MED ORDER — NOREPINEPHRINE BITARTRATE 1 MG/ML IJ SOLN
5.0000 ug/min | INTRAVENOUS | Status: DC
  Administered 2010-12-24: 2.5 ug/min via INTRAVENOUS
  Filled 2010-12-24: qty 16

## 2010-12-24 MED ORDER — VANCOMYCIN HCL 1000 MG IV SOLR
750.0000 mg | INTRAVENOUS | Status: DC
  Administered 2010-12-24: 750 mg via INTRAVENOUS
  Filled 2010-12-24 (×3): qty 750

## 2010-12-24 MED ORDER — FAMOTIDINE 20 MG PO TABS
20.0000 mg | ORAL_TABLET | ORAL | Status: DC
  Administered 2010-12-25 – 2010-12-30 (×5): 20 mg via ORAL
  Filled 2010-12-24 (×9): qty 1

## 2010-12-24 MED ORDER — PENTAFLUOROPROP-TETRAFLUOROETH EX AERO: 1.0000 "application " | INHALATION_SPRAY | CUTANEOUS | Status: DC | PRN

## 2010-12-24 MED ORDER — ACETAMINOPHEN 325 MG PO TABS
650.0000 mg | ORAL_TABLET | Freq: Three times a day (TID) | ORAL | Status: DC | PRN
  Administered 2010-12-24 – 2010-12-30 (×2): 650 mg via ORAL
  Filled 2010-12-24 (×2): qty 2

## 2010-12-24 MED ORDER — STAVUDINE 20 MG PO CAPS
20.0000 mg | ORAL_CAPSULE | Freq: Every day | ORAL | Status: DC
  Administered 2010-12-24 – 2010-12-31 (×7): 20 mg via ORAL
  Filled 2010-12-24 (×8): qty 1

## 2010-12-24 MED ORDER — ONDANSETRON HCL 4 MG PO TABS: 4.0000 mg | ORAL_TABLET | Freq: Four times a day (QID) | ORAL | Status: DC | PRN

## 2010-12-24 MED ORDER — ALTEPLASE 2 MG IJ SOLR
2.0000 mg | Freq: Once | INTRAMUSCULAR | Status: AC | PRN
  Filled 2010-12-24: qty 2

## 2010-12-24 MED ORDER — DARUNAVIR ETHANOLATE 400 MG PO TABS
800.0000 mg | ORAL_TABLET | Freq: Every day | ORAL | Status: DC
  Administered 2010-12-24 – 2010-12-25 (×2): 800 mg via ORAL
  Filled 2010-12-24 (×3): qty 2

## 2010-12-24 MED ORDER — ATAZANAVIR SULFATE 150 MG PO CAPS
300.0000 mg | ORAL_CAPSULE | Freq: Every day | ORAL | Status: DC
  Administered 2010-12-24: 300 mg via ORAL
  Filled 2010-12-24 (×2): qty 2

## 2010-12-24 MED ORDER — LAMIVUDINE 10 MG/ML PO SOLN
25.0000 mg | Freq: Every day | ORAL | Status: DC
  Administered 2010-12-24 – 2010-12-27 (×4): 25 mg via ORAL
  Administered 2010-12-28: 10 mg via ORAL
  Administered 2010-12-29 – 2010-12-31 (×3): 25 mg via ORAL
  Filled 2010-12-24 (×8): qty 5

## 2010-12-24 MED ORDER — DEXTROSE 5 % IV SOLN
1.0000 g | INTRAVENOUS | Status: DC
  Administered 2010-12-24: 1 g via INTRAVENOUS
  Filled 2010-12-24 (×2): qty 10

## 2010-12-24 MED ORDER — DARBEPOETIN ALFA-POLYSORBATE 100 MCG/0.5ML IJ SOLN
100.0000 ug | INTRAMUSCULAR | Status: DC
  Administered 2010-12-24: 100 ug via INTRAVENOUS
  Filled 2010-12-24 (×2): qty 0.5

## 2010-12-24 MED ORDER — NOREPINEPHRINE BITARTRATE 1 MG/ML IJ SOLN
5.0000 ug/min | INTRAVENOUS | Status: AC
  Administered 2010-12-24: 10 ug/min via INTRAVENOUS
  Filled 2010-12-24: qty 8

## 2010-12-24 MED ORDER — LIDOCAINE-PRILOCAINE 2.5-2.5 % EX CREA
1.0000 "application " | TOPICAL_CREAM | CUTANEOUS | Status: DC | PRN
  Filled 2010-12-24: qty 5

## 2010-12-24 MED ORDER — ZOLPIDEM TARTRATE 5 MG PO TABS: 5.0000 mg | ORAL_TABLET | Freq: Every evening | ORAL | Status: DC | PRN

## 2010-12-24 MED ORDER — CEFAZOLIN SODIUM-DEXTROSE 2-3 GM-% IV SOLR
2.0000 g | Freq: Once | INTRAVENOUS | Status: DC
  Filled 2010-12-24: qty 50

## 2010-12-24 MED ORDER — FAMOTIDINE 20 MG PO TABS
20.0000 mg | ORAL_TABLET | Freq: Every day | ORAL | Status: DC
  Administered 2010-12-24: 20 mg via ORAL
  Filled 2010-12-24: qty 1

## 2010-12-24 MED ORDER — ONDANSETRON HCL 4 MG/2ML IJ SOLN: 4.0000 mg | Freq: Four times a day (QID) | INTRAMUSCULAR | Status: DC | PRN

## 2010-12-24 MED ORDER — PIPERACILLIN-TAZOBACTAM IN DEX 2-0.25 GM/50ML IV SOLN
2.2500 g | Freq: Three times a day (TID) | INTRAVENOUS | Status: DC
  Administered 2010-12-24: 2.25 g via INTRAVENOUS
  Filled 2010-12-24 (×3): qty 50

## 2010-12-24 MED ORDER — RITONAVIR 100 MG PO TABS
100.0000 mg | ORAL_TABLET | Freq: Every day | ORAL | Status: DC
  Administered 2010-12-24 – 2010-12-31 (×8): 100 mg via ORAL
  Filled 2010-12-24 (×9): qty 1

## 2010-12-24 MED ORDER — CALCIUM CARBONATE 1250 MG/5ML PO SUSP
500.0000 mg | Freq: Four times a day (QID) | ORAL | Status: DC | PRN
  Filled 2010-12-24: qty 5

## 2010-12-24 MED ORDER — RENA-VITE PO TABS
1.0000 | ORAL_TABLET | Freq: Every day | ORAL | Status: DC
  Administered 2010-12-24 – 2010-12-30 (×6): 1 via ORAL
  Filled 2010-12-24 (×9): qty 1

## 2010-12-24 MED ORDER — NEPRO/CARBSTEADY PO LIQD
237.0000 mL | Freq: Three times a day (TID) | ORAL | Status: DC | PRN
  Filled 2010-12-24: qty 237

## 2010-12-24 MED ORDER — SODIUM CHLORIDE 0.9 % IJ SOLN: 3.0000 mL | INTRAMUSCULAR | Status: DC | PRN

## 2010-12-24 MED ORDER — DARBEPOETIN ALFA-POLYSORBATE 100 MCG/0.5ML IJ SOLN
INTRAMUSCULAR | Status: AC
  Filled 2010-12-24: qty 0.5

## 2010-12-24 MED ORDER — LIDOCAINE HCL (PF) 1 % IJ SOLN
5.0000 mL | INTRAMUSCULAR | Status: DC | PRN
  Filled 2010-12-24: qty 5

## 2010-12-24 MED ORDER — DOCUSATE SODIUM 283 MG RE ENEM
1.0000 | ENEMA | RECTAL | Status: DC | PRN
  Filled 2010-12-24: qty 1

## 2010-12-24 MED ORDER — SODIUM CHLORIDE 0.9 % IJ SOLN
125.0000 mg | INTRAVENOUS | Status: DC
  Administered 2010-12-26: 125 mg via INTRAVENOUS
  Filled 2010-12-24 (×2): qty 10

## 2010-12-24 NOTE — Consult Note (Signed)
[facility] Renal Consultation Note  Indication for Consultation:  Management of ESRD/hemodialysis; anemia, hypertension/volume and secondary hyperparathyroidism  HPI: [name] is a 55 y.o. female.  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  PMH includes multiple failed dialysis accesses and was s/p abdominal aortogram with runoff Dr. Scot Dock on 12/23/10 and after discharged home, presented to the ED with fever, AMS, complaints of aching all over and noted to be hypotensive and hypoxic. Due to her known PMH HIV and MRSA bacteremia in September of this year, she was admitted with Septic Shock for further evaluation and treatment. Renal Consult requested for ongoing RRT. Plans for HD today.  Dialysis Orders: Center: [place] on TTS since 2009. EDW 61.5kg (^ on 12/19/10) HD Bath 2K/2.50 Ca  Time 4 hrs Heparin 5,900 units. Access [facility] PC BFR 400 DFR A 1.5    Zemplar none mcg IV/HD Epogen 10,000 Units IV/HD  Venofer 100mg  IV qwk Other  Past Medical History  Diagnosis Date  . Human immunodeficiency virus (HIV) disease   . Hypertension   . ESRD (end stage renal disease)   . Dialysis patient     pt on dialysis since 2010.  [name] Clostridium difficile infection 04/04/10  . Bacteriuria, asymptomatic 04/04/10    Culture grew VRE sensitive to linesolid   . MGUS (monoclonal gammopathy of unknown significance)   . History of bacteremia     MSSA  . Renal insufficiency   . Anemia    Past Surgical History  Procedure Date  . Av fistula placement    Family History  Problem Relation Age of Onset  . Alcohol abuse      family h/o addiction/alcoholism  . Diabetes Cousin     first degree relatives  . Kidney disease Mother   . Kidney disease Maternal Uncle   . Cancer Sister     reports that she has never smoked. She does not have any smokeless tobacco history on file. She reports that she does not drink alcohol or  use illicit drugs. Allergies  Allergen Reactions  . Penicillins     Unknown reaction   Prior to Admission medications   Medication Sig Start Date End Date Taking? Authorizing Provider  acetaminophen (TYLENOL) 650 MG CR tablet Take 650 mg by mouth every 8 (eight) hours as needed. For pain.   Yes Historical Provider, MD  aspirin 81 MG tablet Take 81 mg by mouth daily.     Yes Historical Provider, MD  atazanavir (REYATAZ) 300 MG capsule Take 300 mg by mouth daily with breakfast.     Yes Historical Provider, MD  b complex vitamins tablet Take 1 tablet by mouth daily.     Yes Historical Provider, MD  b complex-vitamin c-folic acid (NEPHRO-VITE) 0.8 MG TABS Take 0.8 mg by mouth daily.     Yes Historical Provider, MD  darbepoetin (ARANESP, ALBUMIN FREE,) 150 MCG/0.3ML SOLN Inject 150 mcg into the skin. Use weekly for Epogen 20000 units. Dialysis nurse to administer.    Yes Historical Provider, MD  ENSURE (ENSURE) Take 237 mLs by mouth 3 (three) times daily between meals.     Yes Historical Provider, MD  famotidine (PEPCID) 20 MG tablet Take 20 mg by mouth daily.     Yes Historical Provider, MD  iron sucrose (VENOFER) 20 MG/ML injection 100 mg IV on Tuesday HD(HD RN to dispense)    Yes Historical Provider, MD  lamiVUDine (  EPIVIR) 10 MG/ML solution Take 1 teaspoon by mouth every day.    Yes Historical Provider, MD  paricalcitol (ZEMPLAR) 5 MCG/ML injection 4 micrograms IV- dispensed by dialysis nurse.    Yes Historical Provider, MD  ritonavir (NORVIR) 100 MG TABS Take 100 mg by mouth daily with breakfast.    Yes Historical Provider, MD  Stavudine (ZERIT) 1 MG/ML SOLR Take 4 teaspoons(20 ml) by mouth every day.    Yes Historical Provider, MD  sulfamethoxazole-trimethoprim (BACTRIM DS) 800-160 MG per tablet Take 1 tablet by mouth daily. Take 1 tablet by mouth every Monday, Wednesday and Friday.   Yes Historical Provider, MD   I have reviewed the patient's current medications. Results for orders placed  during the hospital encounter of 12/23/10 (from the past 48 hour(s))  CBC     Status: Abnormal   Collection Time   12/23/10  8:21 PM      Component Value Range Comment   WBC 9.0  4.0 - 10.5 (K/uL)    RBC 3.00 (*) 3.87 - 5.11 (MIL/uL)    Hemoglobin 9.8 (*) 12.0 - 15.0 (g/dL)    HCT 30.6 (*) 36.0 - 46.0 (%)    MCV 102.0 (*) 78.0 - 100.0 (fL)    MCH 32.7  26.0 - 34.0 (pg)    MCHC 32.0  30.0 - 36.0 (g/dL)    RDW 15.1  11.5 - 15.5 (%)    Platelets 236  150 - 400 (K/uL)   DIFFERENTIAL     Status: Abnormal   Collection Time   12/23/10  8:21 PM      Component Value Range Comment   Neutrophils Relative 87 (*) 43 - 77 (%)    Neutro Abs 7.9 (*) 1.7 - 7.7 (K/uL)    Lymphocytes Relative 10 (*) 12 - 46 (%)    Lymphs Abs 0.9  0.7 - 4.0 (K/uL)    Monocytes Relative 3  3 - 12 (%)    Monocytes Absolute 0.3  0.1 - 1.0 (K/uL)    Eosinophils Relative 0  0 - 5 (%)    Eosinophils Absolute 0.0  0.0 - 0.7 (K/uL)    Basophils Relative 0  0 - 1 (%)    Basophils Absolute 0.0  0.0 - 0.1 (K/uL)   COMPREHENSIVE METABOLIC PANEL     Status: Abnormal   Collection Time   12/23/10  8:21 PM      Component Value Range Comment   Sodium 130 (*) 135 - 145 (mEq/L)    Potassium 3.8  3.5 - 5.1 (mEq/L)    Chloride 94 (*) 96 - 112 (mEq/L)    CO2 25  19 - 32 (mEq/L)    Glucose, Bld 79  70 - 99 (mg/dL)    BUN 35 (*) 6 - 23 (mg/dL)    Creatinine, Ser 6.22 (*) 0.50 - 1.10 (mg/dL)    Calcium 7.8 (*) 8.4 - 10.5 (mg/dL)    Total Protein 9.2 (*) 6.0 - 8.3 (g/dL)    Albumin 2.0 (*) 3.5 - 5.2 (g/dL)    AST 36  0 - 37 (U/L)    ALT 13  0 - 35 (U/L)    Alkaline Phosphatase 151 (*) 39 - 117 (U/L)    Total Bilirubin 0.4  0.3 - 1.2 (mg/dL)    GFR calc non Af Amer 7 (*) >90 (mL/min)    GFR calc Af Amer 8 (*) >90 (mL/min)   LACTIC ACID, PLASMA     Status: Normal   Collection Time  12/23/10  8:22 PM      Component Value Range Comment   Lactic Acid, Venous 0.9  0.5 - 2.2 (mmol/L)   PROCALCITONIN     Status: Normal   Collection  Time   12/23/10  8:22 PM      Component Value Range Comment   Procalcitonin 2.19     POCT I-STAT 3, BLOOD GAS (G3+)     Status: Abnormal   Collection Time   12/23/10  8:46 PM      Component Value Range Comment   pH, Arterial 7.468 (*) 7.350 - 7.400     pCO2 arterial 36.6  35.0 - 45.0 (mmHg)    pO2, Arterial 48.0 (*) 80.0 - 100.0 (mmHg)    Bicarbonate 26.5 (*) 20.0 - 24.0 (mEq/L)    TCO2 28  0 - 100 (mmol/L)    O2 Saturation 86.0      Acid-Base Excess 3.0 (*) 0.0 - 2.0 (mmol/L)    Collection site RADIAL, ALLEN'S TEST ACCEPTABLE      Drawn by RT      Sample type ARTERIAL     MRSA PCR SCREENING     Status: Normal   Collection Time   12/24/10  1:18 AM      Component Value Range Comment   MRSA by PCR NEGATIVE  NEGATIVE    INFLUENZA PANEL BY PCR     Status: Normal   Collection Time   12/24/10  1:20 AM      Component Value Range Comment   Influenza A By PCR NEGATIVE  NEGATIVE     Influenza B By PCR NEGATIVE  NEGATIVE     H1N1 flu by pcr NOT DETECTED  NOT DETECTED    TYPE AND SCREEN     Status: Normal   Collection Time   12/24/10  1:43 AM      Component Value Range Comment   ABO/RH(D) O POS      Antibody Screen NEG      Sample Expiration 12/27/2010     CBC     Status: Abnormal   Collection Time   12/24/10  1:50 AM      Component Value Range Comment   WBC 13.3 (*) 4.0 - 10.5 (K/uL)    RBC 2.76 (*) 3.87 - 5.11 (MIL/uL)    Hemoglobin 8.9 (*) 12.0 - 15.0 (g/dL)    HCT 28.2 (*) 36.0 - 46.0 (%)    MCV 102.2 (*) 78.0 - 100.0 (fL)    MCH 32.2  26.0 - 34.0 (pg)    MCHC 31.6  30.0 - 36.0 (g/dL)    RDW 15.3  11.5 - 15.5 (%)    Platelets 209  150 - 400 (K/uL)   COMPREHENSIVE METABOLIC PANEL     Status: Abnormal   Collection Time   12/24/10  1:50 AM      Component Value Range Comment   Sodium 133 (*) 135 - 145 (mEq/L)    Potassium 3.6  3.5 - 5.1 (mEq/L)    Chloride 98  96 - 112 (mEq/L)    CO2 22  19 - 32 (mEq/L)    Glucose, Bld 70  70 - 99 (mg/dL)    BUN 37 (*) 6 - 23 (mg/dL)      Creatinine, Ser 6.37 (*) 0.50 - 1.10 (mg/dL)    Calcium 7.5 (*) 8.4 - 10.5 (mg/dL)    Total Protein 8.6 (*) 6.0 - 8.3 (g/dL)    Albumin 1.8 (*) 3.5 - 5.2 (g/dL)  AST 36  0 - 37 (U/L)    ALT 11  0 - 35 (U/L)    Alkaline Phosphatase 144 (*) 39 - 117 (U/L)    Total Bilirubin 0.5  0.3 - 1.2 (mg/dL)    GFR calc non Af Amer 7 (*) >90 (mL/min)    GFR calc Af Amer 8 (*) >90 (mL/min)   LACTIC ACID, PLASMA     Status: Normal   Collection Time   12/24/10  1:50 AM      Component Value Range Comment   Lactic Acid, Venous 1.2  0.5 - 2.2 (mmol/L)   PROCALCITONIN     Status: Normal   Collection Time   12/24/10  1:50 AM      Component Value Range Comment   Procalcitonin 11.35     CARDIAC PANEL(CRET KIN+CKTOT+MB+TROPI)     Status: Normal   Collection Time   12/24/10  1:50 AM      Component Value Range Comment   Total CK 89  7 - 177 (U/L)    CK, MB 1.2  0.3 - 4.0 (ng/mL)    Troponin I <0.30  <0.30 (ng/mL)    Relative Index RELATIVE INDEX IS INVALID  0.0 - 2.5    PROTIME-INR     Status: Abnormal   Collection Time   12/24/10  1:50 AM      Component Value Range Comment   Prothrombin Time 16.6 (*) 11.6 - 15.2 (seconds)    INR 1.32  0.00 - 1.49    APTT     Status: Abnormal   Collection Time   12/24/10  1:50 AM      Component Value Range Comment   aPTT 39 (*) 24 - 37 (seconds)   FIBRINOGEN     Status: Normal   Collection Time   12/24/10  1:50 AM      Component Value Range Comment   Fibrinogen 218  204 - 475 (mg/dL)   PRO B NATRIURETIC PEPTIDE     Status: Abnormal   Collection Time   12/24/10  1:50 AM      Component Value Range Comment   Pro B Natriuretic peptide (BNP) 67087.0 (*) 0 - 125 (pg/mL)   AMYLASE     Status: Abnormal   Collection Time   12/24/10  1:50 AM      Component Value Range Comment   Amylase 357 (*) 0 - 105 (U/L)   LIPASE, BLOOD     Status: Abnormal   Collection Time   12/24/10  1:50 AM      Component Value Range Comment   Lipase 64 (*) 11 - 59 (U/L)   CBC      Status: Abnormal   Collection Time   12/24/10  5:30 AM      Component Value Range Comment   WBC 14.7 (*) 4.0 - 10.5 (K/uL)    RBC 2.84 (*) 3.87 - 5.11 (MIL/uL)    Hemoglobin 9.3 (*) 12.0 - 15.0 (g/dL)    HCT 29.3 (*) 36.0 - 46.0 (%)    MCV 103.2 (*) 78.0 - 100.0 (fL)    MCH 32.7  26.0 - 34.0 (pg)    MCHC 31.7  30.0 - 36.0 (g/dL)    RDW 15.5  11.5 - 15.5 (%)    Platelets 230  150 - 400 (K/uL)   BASIC METABOLIC PANEL     Status: Abnormal   Collection Time   12/24/10  5:30 AM      Component Value Range Comment  Sodium 130 (*) 135 - 145 (mEq/L)    Potassium 3.5  3.5 - 5.1 (mEq/L)    Chloride 95 (*) 96 - 112 (mEq/L)    CO2 24  19 - 32 (mEq/L)    Glucose, Bld 90  70 - 99 (mg/dL)    BUN 39 (*) 6 - 23 (mg/dL)    Creatinine, Ser 6.72 (*) 0.50 - 1.10 (mg/dL)    Calcium 7.5 (*) 8.4 - 10.5 (mg/dL)    GFR calc non Af Amer 6 (*) >90 (mL/min)    GFR calc Af Amer 7 (*) >90 (mL/min)   CARDIAC PANEL(CRET KIN+CKTOT+MB+TROPI)     Status: Normal   Collection Time   12/24/10  9:02 AM      Component Value Range Comment   Total CK 101  7 - 177 (U/L)    CK, MB 1.5  0.3 - 4.0 (ng/mL)    Troponin I <0.30  <0.30 (ng/mL)    Relative Index 1.5  0.0 - 2.5     ROS:   Physical Exam: Filed Vitals:   12/24/10 1145  BP: 115/79  Pulse: 76  Temp:   Resp: 18     General: Comfortable Heart: RRR, 3/6 SEM Lungs: Coarse through out, cleared w/cough, diminished bases Abdomen: soft, NT/ND, +BS Extremities: chronic lower extremity edema (lymphedema), L>R; left femoral central line Neuro: awake, alert, grossly intact Dialysis Access: [facility] PC  Assessment/Plan: 1. Septic Shock- T-Max 104.8 on yesterday; prelim Bld Cx Gram positive cocci; on IV Vanc and Rocephin; currently afebrile 98.9; WBC 14.7; CXR today possible RLL pneumonia; ID following 2. ESRD - TTS[place]; HD today 3. Vascular access- s/p aortogram with bilateral lower extremity runoff on by Dr. Scot Dock on 12/23/10 to evaluate for right thigh AVG;  currently [facility] access   4. Hypotension/volume  -chronic low BP's in outpt setting on Midodrine; currently on Levophed drip Elevated Amylase and Lipase  5. Anemia  - Hgb 10.2 on 12/19/10; 9.3 this am; outpt epogen 10,000u qtx; will give Aranesp 192mcg IV today and follow 6. Metabolic bone disease - no binders; CaCO3 with meals outpt; phos 3.7 and iPTH 276 on 12/05/10; no vitamin D; appetite poor since adm; follow ca/phos and resume as necessary 7. Nutrition - history chronic low albumin 2.6 on 12/05/10; on Nepro, Prostat and ONSP in the outpt setting; appetite remains poor; follow closely 8. HIV- ID following  9. Disposition- per primary servicesI have seen and examined this patient and agree with the plan of care of this very pleasant dialysis  On TTS schedule will dialyze today. Note that blood pressure chronically is low.  [facility] W 12/24/2010, 4:15 PM    Marni Griffon, FNP-C [facility] Pager [phone] 12/24/2010, 11:47 AM   Attending Nephrologist: Dr. Edrick Oh

## 2010-12-24 NOTE — Procedures (Addendum)
Central Venous Catheter Insertion Procedure Note CECYLIA PANLILIO [id number] [birth year]  Procedure: Insertion of Central Venous Catheter Indications: Drug and/or fluid administration  Procedure Details Consent: Risks of procedure as well as the alternatives and risks of each were explained to the (patient/caregiver).  Consent for procedure obtained. Time Out: Verified patient identification, verified procedure, site/side was marked, verified correct patient position, special equipment/implants available, medications/allergies/relevent history reviewed, required imaging and test results available.  Performed  Maximum sterile technique was used including antiseptics, cap, gloves, gown, hand hygiene, mask and sheet. Skin prep: Chlorhexidine; local anesthetic administered A antimicrobial bonded/coated triple lumen catheter was placed in the left femoral vein due to multiple attempts, no other available access using the Seldinger technique.  Ultrasound used for vessel identification and guidance.  Please note that a right IJ was attempted and the vein was very easily accessed multiple times.  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  Because of this the left femoral line was place and a CXR was ordered.  Evaluation Blood flow good Complications: No apparent complications Patient did tolerate procedure well. Chest X-ray ordered to verify placement.  CXR: pending.  MCQUAID, DOUGLAS 12/24/2010, 12:15 AM

## 2010-12-24 NOTE — Progress Notes (Signed)
ANTIBIOTIC CONSULT NOTE - INITIAL  Pharmacy Consult for Ancef Indication: Possible MSSA bacteremia  No Active Allergies  Patient Measurements: Height: 5\' 3"  (160 cm) Weight: 141 lb 8.6 oz (64.2 kg) IBW/kg (Calculated) : 52.4   Vital Signs: Temp: 98.1 F (36.7 C) (12/18 1538) Temp src: Oral (12/18 1538) BP: 87/41 mmHg (12/18 1615) Pulse Rate: 72  (12/18 1615)  Labs:  [facility] 12/24/10 0530 12/24/10 0150 12/23/10 2021  WBC 14.7* 13.3* 9.0  HGB 9.3* 8.9* 9.8*  PLT 230 209 236  LABCREA -- -- --  CREATININE 6.72* 6.37* 6.22*   Estimated Creatinine Clearance: 8.6 ml/min (by C-G formula based on Cr of 6.72). No results found for this basename: VANCOTROUGH:2,VANCOPEAK:2,VANCORANDOM:2,GENTTROUGH:2,GENTPEAK:2,GENTRANDOM:2,TOBRATROUGH:2,TOBRAPEAK:2,TOBRARND:2,AMIKACINPEAK:2,AMIKACINTROU:2,AMIKACIN:2, in the last 72 hours   Microbiology: Recent Results (from the past 720 hour(s))  CULTURE, BLOOD (ROUTINE X 2)     Status: Normal (Preliminary result)   Collection Time   12/23/10  8:10 PM      Component Value Range Status Comment   Specimen Description BLOOD ARM LEFT   Final    Special Requests BOTTLES DRAWN AEROBIC AND ANAEROBIC 10CC   Final    Setup Time [id number]   Final    Culture     Final    Value: GRAM POSITIVE COCCI IN CLUSTERS     Note: Gram Stain Report Called to,Read Back By and Verified WithMathews Robinsons @ [id number] 12/24/10 [place]ICK[place]   Report Status PENDING   Incomplete   CULTURE, BLOOD (ROUTINE X 2)     Status: Normal (Preliminary result)   Collection Time   12/23/10  8:20 PM      Component Value Range Status Comment   Specimen Description BLOOD ARM RIGHT   Final    Special Requests BOTTLES DRAWN AEROBIC AND ANAEROBIC 10CC   Final    Setup Time [id number]   Final    Culture     Final    Value: GRAM POSITIVE COCCI IN CLUSTERS     Note: Gram Stain Report Called to,Read Back By and Verified WithMathews Robinsons @ [id number] 12/24/10 WICKN   Report Status PENDING   Incomplete    MRSA PCR SCREENING     Status: Normal   Collection Time   12/24/10  1:18 AM      Component Value Range Status Comment   MRSA by PCR NEGATIVE  NEGATIVE  Final     Medical History: Past Medical History  Diagnosis Date  . Human immunodeficiency virus (HIV) disease   . Hypertension   . ESRD (end stage renal disease)   . Dialysis patient     pt on dialysis since 2010.  [name] Clostridium difficile infection 04/04/10  . Bacteriuria, asymptomatic 04/04/10    Culture grew VRE sensitive to linesolid   . MGUS (monoclonal gammopathy of unknown significance)   . History of bacteremia     MSSA  . Renal insufficiency   . Anemia     Assessment: 8 YOF with PMH significant for HIV/AIDS, ESRD, and MSSA bacteremia (Sep. 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 Bld culture x 2 positive for GPC in clusters, sensitivity pending. Pt. Current HD schedule is Thu/Thur/Sat.  Plan:  Start Ancef 2g IV QHD first dose today after dialysis  Manley Mason 12/24/2010,4:30 PM

## 2010-12-24 NOTE — Progress Notes (Signed)
Name: [name] MRN: [id number] DOB: 06/06/1955  LOS: 1  CRITICAL CARE ADMISSION NOTE  History of Present Illness:  55 y/o female with ESRD, HIV and multiple failed dialysis access came to the [facility] ED today with fever, confusion, aching all over.  She underwent a bilateral lower extremity runoff procedure today at [facility] to assess her dialysis access.  She last went to dialysis on Saturday.  After receiving tylenol antibiotics and fluids in the ED she became more awake and alert.  We were consulted for persistant hypotension.  Lines / Drains: R [place] HD cath (chronic) >> 12/23/10 L Fem CVL >>  Cultures / Sepsis markers: 12/17 procalcitonin: 11/35 12/17 blood cx >> 2/2 GPC clusters 12/17 urine cx >>   Antibiotics: 12/17 zosyn  >> 12/18 12/17 cipro >>12/18 12/17 vanc >>  12/18 Ceftriaxone >>  Tests / Events:    SUBJ: No distress. No new complaints.   Vital Signs:   Filed Vitals:   12/24/10 1245 12/24/10 1315 12/24/10 1345 12/24/10 1411  BP: 83/45 87/44 90/45  89/45  Pulse: 75 101 75   Temp:    98.9 F (37.2 C)  TempSrc:    Oral  Resp: 19 20 20    Height:      Weight:    64.2 kg (141 lb 8.6 oz)  SpO2: 92% 96% 96%      Physical Examination: Gen: chronically ill appearing, no respiratory distress HEENT: NCAT, PERRL, EOMi, MM dry  Neck: supple without masses PULM: Insp crackles in bases bilaterally CV: tachy, regular, loud systolic murmur at apex, JVD noted AB: BS+, soft, nontender, no hsm Ext: warm, no edema, no clubbing, no cyanosis, R groin with bandage in place, c/d/i Derm: no rash or skin breakdown Neuro: A&Ox4, CN II-XII intact, strength 5/5 in all 4 extremities Psyche: Normal mood and affect  Labs and Imaging:   CBC    Component Value Date/Time   WBC 14.7* 12/24/2010 0530   RBC 2.84* 12/24/2010 0530   HGB 9.3* 12/24/2010 0530   HCT 29.3* 12/24/2010 0530   PLT 230 12/24/2010 0530   MCV 103.2* 12/24/2010 0530   MCH 32.7 12/24/2010 0530   MCHC 31.7 12/24/2010  0530   RDW 15.5 12/24/2010 0530   LYMPHSABS 0.9 12/23/2010 2021   MONOABS 0.3 12/23/2010 2021   EOSABS 0.0 12/23/2010 2021   BASOSABS 0.0 12/23/2010 2021    BMET    Component Value Date/Time   NA 130* 12/24/2010 0530   K 3.5 12/24/2010 0530   CL 95* 12/24/2010 0530   CO2 24 12/24/2010 0530   GLUCOSE 90 12/24/2010 0530   BUN 39* 12/24/2010 0530   CREATININE 6.72* 12/24/2010 0530   CALCIUM 7.5* 12/24/2010 0530   CALCIUM 7.6* 09/14/2007 1321   GFRNONAA 6* 12/24/2010 0530   GFRAA 7* 12/24/2010 0530   CXR: Edema vs ALI pattern  Assessment and Plan: 55 y/o female presents with one day of achiness, confusion, fever, and evidence of a RLL pneumonia on CXR.  She has septic shock from HCAP.  DDx also includes influenza or other opportunistic respiratory infection.  Favor HCAP.  Very limited IV access.  Septic shock (12/24/2010)   Assessment: Due to GPC bacteremia - likely related to HD access.    Plan: Weaning off pressors. Tolerate a lower MAP as she is ESRD pt. D/C Cipro and Zosyn. Begin Ceftrx pending final sensitivities. ID consult requested as they follow her as outpt for HIV  HIV INFECTION (03/19/2007) -Cont antiretrovirals -ID consult  ANEMIA OF CHRONIC DISEASE (03/19/2007)  No workup indicated @ present. Cont to monitor. Transfuse for Hgb < 7.0   CONGESTIVE CARDIOMYOPATHY (03/19/2007)   Assessment: not overtly volume overloaded but CXR appearance is worrisome. Limit volume  ESRD (03/19/2007) Renal consult in AM   Code Full  Best practices / Disposition:  Feeding/protein malnutrition: renal diet Analgesia: n/a Sedation: n/a Thromboprophylaxis: SCDs Ulcer prophylaxis: famotidine (takes chronically) Glucose control/hyperglycemia: not requiring insulin   Merton Border, MD;  PCCM service; Mobile [phone]

## 2010-12-24 NOTE — ED Notes (Signed)
Post central line attempt chestxray made

## 2010-12-24 NOTE — Progress Notes (Signed)
Clinical Social Worker received referral, pt currently receiving hemodialysis.  CSW unable to complete psychosocial assessment at this time.  CSW to continue to follow and assist as needed.  Bing Ree, MSW, [place]

## 2010-12-24 NOTE — Progress Notes (Signed)
ANTIBIOTIC CONSULT NOTE - INITIAL  Pharmacy Consult for zosyn + vancomycin + ciprofloxacin Indication: rule out sepsis  Allergies  Allergen Reactions  . Penicillins     Unknown reaction    Vital Signs: Temp: 103.3 F (39.6 C) (12/18 0023) Temp src: Oral (12/18 0023) BP: 85/43 mmHg (12/18 0024) Pulse Rate: 96  (12/18 0024) Intake/Output from previous day: 12/17 0701 - 12/18 0700 In: 1500 [I.V.:1500] Out: -  Intake/Output from this shift: Total I/O In: 1500 [I.V.:1500] Out: -   Labs:  [facility] 12/23/10 2021  WBC 9.0  HGB 9.8*  PLT 236  LABCREA --  CREATININE 6.22*   The CrCl is unknown because both a height and weight (above a minimum accepted value) are required for this calculation. No results found for this basename: VANCOTROUGH:2,VANCOPEAK:2,VANCORANDOM:2,GENTTROUGH:2,GENTPEAK:2,GENTRANDOM:2,TOBRATROUGH:2,TOBRAPEAK:2,TOBRARND:2,AMIKACINPEAK:2,AMIKACINTROU:2,AMIKACIN:2, in the last 72 hours   Microbiology: No results found for this or any previous visit (from the past 720 hour(s)).  Medical History: Past Medical History  Diagnosis Date  . Human immunodeficiency virus (HIV) disease   . Hypertension   . ESRD (end stage renal disease)   . Dialysis patient     pt on dialysis since 2010.  [name] Clostridium difficile infection 04/04/10  . Bacteriuria, asymptomatic 04/04/10    Culture grew VRE sensitive to linesolid   . MGUS (monoclonal gammopathy of unknown significance)   . History of bacteremia     MSSA  . Renal insufficiency   . Anemia     Medications:  Prescriptions prior to admission  Medication Sig Dispense Refill  . acetaminophen (TYLENOL) 650 MG CR tablet Take 650 mg by mouth every 8 (eight) hours as needed. For pain.      [name] aspirin 81 MG tablet Take 81 mg by mouth daily.        [name] atazanavir (REYATAZ) 300 MG capsule Take 300 mg by mouth daily with breakfast.        . b complex vitamins tablet Take 1 tablet by mouth daily.        [name] b complex-vitamin  c-folic acid (NEPHRO-VITE) 0.8 MG TABS Take 0.8 mg by mouth daily.        . darbepoetin (ARANESP, ALBUMIN FREE,) 150 MCG/0.3ML SOLN Inject 150 mcg into the skin. Use weekly for Epogen 20000 units. Dialysis nurse to administer.       [name] ENSURE (ENSURE) Take 237 mLs by mouth 3 (three) times daily between meals.        . famotidine (PEPCID) 20 MG tablet Take 20 mg by mouth daily.        . iron sucrose (VENOFER) 20 MG/ML injection 100 mg IV on Tuesday HD(HD RN to dispense)       . lamiVUDine (EPIVIR) 10 MG/ML solution Take 1 teaspoon by mouth every day.       . paricalcitol (ZEMPLAR) 5 MCG/ML injection 4 micrograms IV- dispensed by dialysis nurse.       . ritonavir (NORVIR) 100 MG TABS Take 100 mg by mouth daily with breakfast.       . Stavudine (ZERIT) 1 MG/ML SOLR Take 4 teaspoons(20 ml) by mouth every day.       . sulfamethoxazole-trimethoprim (BACTRIM DS) 800-160 MG per tablet Take 1 tablet by mouth daily. Take 1 tablet by mouth every Monday, Wednesday and Friday.       Assessment: 51 yof with history of ESRD and HIV to start empiric antibiotics for sepsis. Received 1gm vancomycin + 3.375gm IV zosyn in the ED. Pt tolerated the zosyn despite  PCN allergy per MD note. Last HD was on Saturday. Renal consult ordered for the AM.   Goal of Therapy:  Pre-HD vanc level 15-25  Plan:  Measure antibiotic drug levels at steady state Follow up culture results Vancomycin 500mg  IV x 1 for a total 1500mg  loading dose Zosyn 2.25gm IV Q8H Cipro 400mg  IV Q24H F/u HD plans for further vanc dosing  Timo Hartwig, Rande Lawman 12/24/2010,1:45 AM

## 2010-12-24 NOTE — ED Notes (Signed)
Levophed requested from the pharmacy

## 2010-12-24 NOTE — Consult Note (Signed)
Date of Admission:  12/23/2010  Date of Consult:  12/24/2010  Reason for Consult:Sepsis, bacteremia, HIV Referring Physician: Merton Border, MD   HPI: [name] is an 55 y.o. female. African American female known to me from prior admissions to the hospital who has HIV and  AIDS and is terribly noncompliant with her antiretroviral therapy. 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 Kellie Simmering She had protracted stay and was to dc with ancef with HD thru October 1st. She has been followed by Dr. Scot Dock from [facility] who scheduled arteriogram to assess new sites for AV graft for December 17th. . This showed '; No significant aortoiliac occlusive disease.  2. Patent right superficial femoral artery deep femoral artery common femoral artery. There is 2 vessel runoff on the right via the posterior tibial and peroneal arteries. The right anterior tibial artery is not visualized.  3. On the left side, the common femoral, superficial femoral, deep femoral artery, popliteal and tibial vessels are patent"  She then came to the ED with complaints of chest pain and was found to be hypotensive and in septic shock. She had blood cultures drawn and was placed on vancomycin and zosyn and blood cutlures are yet again growign [facility]CC.   Past Medical History  Diagnosis Date  . Human immunodeficiency virus (HIV) disease   . Hypertension   . ESRD (end stage renal disease)   . Dialysis patient     pt on dialysis since 2010.  [name] Clostridium difficile infection 04/04/10  . Bacteriuria, asymptomatic 04/04/10    Culture grew VRE sensitive to linesolid   . MGUS (monoclonal gammopathy of unknown significance)   . History of bacteremia     MSSA  . Renal insufficiency   . Anemia     Past Surgical History  Procedure Date  . Av fistula placement   ergies:   No Active Allergies   Medications: I have reviewed  patients current medications as documented in Epic Anti-infectives     Start     Dose/Rate Route Frequency Ordered Stop   12/25/10 1000  sulfamethoxazole-trimethoprim (BACTRIM DS) 800-160 MG per tablet 1 tablet       1 tablet Oral Every M-W-F 12/24/10 0121     12/24/10 1800   vancomycin (VANCOCIN) 750 mg in sodium chloride 0.9 % 150 mL IVPB        750 mg 150 mL/hr over 60 Minutes Intravenous Every T-Th-Sa (Hemodialysis) 12/24/10 1253     12/24/10 1330   cefTRIAXone (ROCEPHIN) 1 g in dextrose 5 % 50 mL IVPB        1 g 100 mL/hr over 30 Minutes Intravenous Every 24 hours 12/24/10 1219     12/24/10 1000   lamiVUDine (EPIVIR) 10 MG/ML solution 25 mg        25 mg Oral Daily 12/24/10 0121     12/24/10 1000   stavudine (ZERIT) capsule 20 mg        20 mg Oral Daily 12/24/10 0121     12/24/10 1000   oseltamivir (TAMIFLU) capsule 75 mg  Status:  Discontinued        75 mg Oral Daily 12/24/10 0121 12/24/10 1048   12/24/10 0800   atazanavir (REYATAZ) capsule 300 mg        300 mg Oral Daily with breakfast 12/24/10 0121     12/24/10 0800  ritonavir (NORVIR) tablet 100 mg        100 mg Oral Daily with breakfast 12/24/10 0121     12/24/10 0600   piperacillin-tazobactam (ZOSYN) IVPB 2.25 g  Status:  Discontinued        2.25 g 100 mL/hr over 30 Minutes Intravenous 3 times per day 12/24/10 0144 12/24/10 1219   12/24/10 0230   vancomycin (VANCOCIN) 500 mg in sodium chloride 0.9 % 100 mL IVPB        500 mg 100 mL/hr over 60 Minutes Intravenous  Once 12/24/10 0144 12/24/10 0357   12/24/10 0230   ciprofloxacin (CIPRO) IVPB 400 mg  Status:  Discontinued        400 mg 200 mL/hr over 60 Minutes Intravenous Every 24 hours 12/24/10 0144 12/24/10 1219   12/24/10 0130   vancomycin (VANCOCIN) IVPB 1000 mg/200 mL premix  Status:  Discontinued        1,000 mg 200 mL/hr over 60 Minutes Intravenous  Once 12/24/10 0120 12/24/10 0138   12/23/10 2045   vancomycin (VANCOCIN) IVPB 1000 mg/200 mL premix         1,000 mg 200 mL/hr over 60 Minutes Intravenous  Once 12/23/10 2034 12/23/10 2246   12/23/10 2045   piperacillin-tazobactam (ZOSYN) IVPB 3.375 g        3.375 g 12.5 mL/hr over 240 Minutes Intravenous  Once 12/23/10 2034 12/24/10 0145          Social History:  reports that she has never smoked. She does not have any smokeless tobacco history on file. She reports that she does not drink alcohol or use illicit drugs.  Family History  Problem Relation Age of Onset  . Alcohol abuse      family h/o addiction/alcoholism  . Diabetes Cousin     first degree relatives  . Kidney disease Mother   . Kidney disease Maternal Uncle   . Cancer Sister     As in HPI and primary teams notes otherwise 12 point review of systems is negative  Blood pressure 93/44, pulse 71, temperature 98.9 F (37.2 C), temperature source Oral, resp. rate 19, height 5\' 3"  (1.6 m), weight 141 lb 8.6 oz (64.2 kg), SpO2 97.00%.  General: Alert and awake, oriented x3, not in any acute distress. HEENT: anicteric sclera, pupils reactive to light and accommodation, EOMI, oropharynx clear and without exudate CVS tachycardic, normal r,  no murmur rubs or gallops Chest: fairly clear,  Abdomen: soft nontender, nondistended, normal bowel sounds, Extremities: 2+ edema periperhally Skin: HD site is not overtly fluctuant in Left side, left central line clear Neuro: nonfocal, strength and sensation intact   Results for orders placed during the hospital encounter of 12/23/10 (from the past 48 hour(s))  POCT I-STAT, CHEM 8     Status: Abnormal   Collection Time   12/23/10  8:21 AM      Component Value Range Comment   Sodium 137  135 - 145 (mEq/L)    Potassium 3.8  3.5 - 5.1 (mEq/L)    Chloride 98  96 - 112 (mEq/L)    BUN 33 (*) 6 - 23 (mg/dL)    Creatinine, Ser 4.80 (*) 0.50 - 1.10 (mg/dL)    Glucose, Bld 75  70 - 99 (mg/dL)    Calcium, Ion 1.08 (*) 1.12 - 1.32 (mmol/L)    TCO2 32  0 - 100 (mmol/L)    Hemoglobin 12.6  12.0  - 15.0 (g/dL)    HCT 37.0  36.0 -  46.0 (%)   CULTURE, BLOOD (ROUTINE X 2)     Status: Normal (Preliminary result)   Collection Time   12/23/10  8:10 PM      Component Value Range Comment   Specimen Description BLOOD ARM LEFT      Special Requests BOTTLES DRAWN AEROBIC AND ANAEROBIC 10CC      Setup Time [id number]      Culture        Value: GRAM POSITIVE COCCI IN CLUSTERS     Note: Gram Stain Report Called to,Read Back By and Verified With: [facility] @ [id number] 12/24/10 WICKN   Report Status PENDING     CULTURE, BLOOD (ROUTINE X 2)     Status: Normal (Preliminary result)   Collection Time   12/23/10  8:20 PM      Component Value Range Comment   Specimen Description BLOOD ARM RIGHT      Special Requests BOTTLES DRAWN AEROBIC AND ANAEROBIC 10CC      Setup Time [id number]      Culture        Value: GRAM POSITIVE COCCI IN CLUSTERS     Note: Gram Stain Report Called to,Read Back By and Verified WithMathews Robinsons @ [id number] 12/24/10 WICKN   Report Status PENDING     CBC     Status: Abnormal   Collection Time   12/23/10  8:21 PM      Component Value Range Comment   WBC 9.0  4.0 - 10.5 (K/uL)    RBC 3.00 (*) 3.87 - 5.11 (MIL/uL)    Hemoglobin 9.8 (*) 12.0 - 15.0 (g/dL)    HCT 30.6 (*) 36.0 - 46.0 (%)    MCV 102.0 (*) 78.0 - 100.0 (fL)    MCH 32.7  26.0 - 34.0 (pg)    MCHC 32.0  30.0 - 36.0 (g/dL)    RDW 15.1  11.5 - 15.5 (%)    Platelets 236  150 - 400 (K/uL)   DIFFERENTIAL     Status: Abnormal   Collection Time   12/23/10  8:21 PM      Component Value Range Comment   Neutrophils Relative 87 (*) 43 - 77 (%)    Neutro Abs 7.9 (*) 1.7 - 7.7 (K/uL)    Lymphocytes Relative 10 (*) 12 - 46 (%)    Lymphs Abs 0.9  0.7 - 4.0 (K/uL)    Monocytes Relative 3  3 - 12 (%)    Monocytes Absolute 0.3  0.1 - 1.0 (K/uL)    Eosinophils Relative 0  0 - 5 (%)    Eosinophils Absolute 0.0  0.0 - 0.7 (K/uL)    Basophils Relative 0  0 - 1 (%)    Basophils Absolute 0.0  0.0 - 0.1 (K/uL)     COMPREHENSIVE METABOLIC PANEL     Status: Abnormal   Collection Time   12/23/10  8:21 PM      Component Value Range Comment   Sodium 130 (*) 135 - 145 (mEq/L)    Potassium 3.8  3.5 - 5.1 (mEq/L)    Chloride 94 (*) 96 - 112 (mEq/L)    CO2 25  19 - 32 (mEq/L)    Glucose, Bld 79  70 - 99 (mg/dL)    BUN 35 (*) 6 - 23 (mg/dL)    Creatinine, Ser 6.22 (*) 0.50 - 1.10 (mg/dL)    Calcium 7.8 (*) 8.4 - 10.5 (mg/dL)    Total Protein 9.2 (*) 6.0 - 8.3 (g/dL)  Albumin 2.0 (*) 3.5 - 5.2 (g/dL)    AST 36  0 - 37 (U/L)    ALT 13  0 - 35 (U/L)    Alkaline Phosphatase 151 (*) 39 - 117 (U/L)    Total Bilirubin 0.4  0.3 - 1.2 (mg/dL)    GFR calc non Af Amer 7 (*) >90 (mL/min)    GFR calc Af Amer 8 (*) >90 (mL/min)   LACTIC ACID, PLASMA     Status: Normal   Collection Time   12/23/10  8:22 PM      Component Value Range Comment   Lactic Acid, Venous 0.9  0.5 - 2.2 (mmol/L)   PROCALCITONIN     Status: Normal   Collection Time   12/23/10  8:22 PM      Component Value Range Comment   Procalcitonin 2.19     POCT I-STAT 3, BLOOD GAS (G3+)     Status: Abnormal   Collection Time   12/23/10  8:46 PM      Component Value Range Comment   pH, Arterial 7.468 (*) 7.350 - 7.400     pCO2 arterial 36.6  35.0 - 45.0 (mmHg)    pO2, Arterial 48.0 (*) 80.0 - 100.0 (mmHg)    Bicarbonate 26.5 (*) 20.0 - 24.0 (mEq/L)    TCO2 28  0 - 100 (mmol/L)    O2 Saturation 86.0      Acid-Base Excess 3.0 (*) 0.0 - 2.0 (mmol/L)    Collection site RADIAL, ALLEN'S TEST ACCEPTABLE      Drawn by RT      Sample type ARTERIAL     MRSA PCR SCREENING     Status: Normal   Collection Time   12/24/10  1:18 AM      Component Value Range Comment   MRSA by PCR NEGATIVE  NEGATIVE    INFLUENZA PANEL BY PCR     Status: Normal   Collection Time   12/24/10  1:20 AM      Component Value Range Comment   Influenza A By PCR NEGATIVE  NEGATIVE     Influenza B By PCR NEGATIVE  NEGATIVE     H1N1 flu by pcr NOT DETECTED  NOT DETECTED     TYPE AND SCREEN     Status: Normal   Collection Time   12/24/10  1:43 AM      Component Value Range Comment   ABO/RH(D) O POS      Antibody Screen NEG      Sample Expiration 12/27/2010     CBC     Status: Abnormal   Collection Time   12/24/10  1:50 AM      Component Value Range Comment   WBC 13.3 (*) 4.0 - 10.5 (K/uL)    RBC 2.76 (*) 3.87 - 5.11 (MIL/uL)    Hemoglobin 8.9 (*) 12.0 - 15.0 (g/dL)    HCT 28.2 (*) 36.0 - 46.0 (%)    MCV 102.2 (*) 78.0 - 100.0 (fL)    MCH 32.2  26.0 - 34.0 (pg)    MCHC 31.6  30.0 - 36.0 (g/dL)    RDW 15.3  11.5 - 15.5 (%)    Platelets 209  150 - 400 (K/uL)   COMPREHENSIVE METABOLIC PANEL     Status: Abnormal   Collection Time   12/24/10  1:50 AM      Component Value Range Comment   Sodium 133 (*) 135 - 145 (mEq/L)    Potassium 3.6  3.5 -  5.1 (mEq/L)    Chloride 98  96 - 112 (mEq/L)    CO2 22  19 - 32 (mEq/L)    Glucose, Bld 70  70 - 99 (mg/dL)    BUN 37 (*) 6 - 23 (mg/dL)    Creatinine, Ser 6.37 (*) 0.50 - 1.10 (mg/dL)    Calcium 7.5 (*) 8.4 - 10.5 (mg/dL)    Total Protein 8.6 (*) 6.0 - 8.3 (g/dL)    Albumin 1.8 (*) 3.5 - 5.2 (g/dL)    AST 36  0 - 37 (U/L)    ALT 11  0 - 35 (U/L)    Alkaline Phosphatase 144 (*) 39 - 117 (U/L)    Total Bilirubin 0.5  0.3 - 1.2 (mg/dL)    GFR calc non Af Amer 7 (*) >90 (mL/min)    GFR calc Af Amer 8 (*) >90 (mL/min)   LACTIC ACID, PLASMA     Status: Normal   Collection Time   12/24/10  1:50 AM      Component Value Range Comment   Lactic Acid, Venous 1.2  0.5 - 2.2 (mmol/L)   PROCALCITONIN     Status: Normal   Collection Time   12/24/10  1:50 AM      Component Value Range Comment   Procalcitonin 11.35     CARDIAC PANEL(CRET KIN+CKTOT+MB+TROPI)     Status: Normal   Collection Time   12/24/10  1:50 AM      Component Value Range Comment   Total CK 89  7 - 177 (U/L)    CK, MB 1.2  0.3 - 4.0 (ng/mL)    Troponin I <0.30  <0.30 (ng/mL)    Relative Index RELATIVE INDEX IS INVALID  0.0 - 2.5    PROTIME-INR      Status: Abnormal   Collection Time   12/24/10  1:50 AM      Component Value Range Comment   Prothrombin Time 16.6 (*) 11.6 - 15.2 (seconds)    INR 1.32  0.00 - 1.49    APTT     Status: Abnormal   Collection Time   12/24/10  1:50 AM      Component Value Range Comment   aPTT 39 (*) 24 - 37 (seconds)   FIBRINOGEN     Status: Normal   Collection Time   12/24/10  1:50 AM      Component Value Range Comment   Fibrinogen 218  204 - 475 (mg/dL)   PRO B NATRIURETIC PEPTIDE     Status: Abnormal   Collection Time   12/24/10  1:50 AM      Component Value Range Comment   Pro B Natriuretic peptide (BNP) 67087.0 (*) 0 - 125 (pg/mL)   CORTISOL     Status: Normal   Collection Time   12/24/10  1:50 AM      Component Value Range Comment   Cortisol, Plasma 17.6     AMYLASE     Status: Abnormal   Collection Time   12/24/10  1:50 AM      Component Value Range Comment   Amylase 357 (*) 0 - 105 (U/L)   LIPASE, BLOOD     Status: Abnormal   Collection Time   12/24/10  1:50 AM      Component Value Range Comment   Lipase 64 (*) 11 - 59 (U/L)   CBC     Status: Abnormal   Collection Time   12/24/10  5:30 AM  Component Value Range Comment   WBC 14.7 (*) 4.0 - 10.5 (K/uL)    RBC 2.84 (*) 3.87 - 5.11 (MIL/uL)    Hemoglobin 9.3 (*) 12.0 - 15.0 (g/dL)    HCT 29.3 (*) 36.0 - 46.0 (%)    MCV 103.2 (*) 78.0 - 100.0 (fL)    MCH 32.7  26.0 - 34.0 (pg)    MCHC 31.7  30.0 - 36.0 (g/dL)    RDW 15.5  11.5 - 15.5 (%)    Platelets 230  150 - 400 (K/uL)   BASIC METABOLIC PANEL     Status: Abnormal   Collection Time   12/24/10  5:30 AM      Component Value Range Comment   Sodium 130 (*) 135 - 145 (mEq/L)    Potassium 3.5  3.5 - 5.1 (mEq/L)    Chloride 95 (*) 96 - 112 (mEq/L)    CO2 24  19 - 32 (mEq/L)    Glucose, Bld 90  70 - 99 (mg/dL)    BUN 39 (*) 6 - 23 (mg/dL)    Creatinine, Ser 6.72 (*) 0.50 - 1.10 (mg/dL)    Calcium 7.5 (*) 8.4 - 10.5 (mg/dL)    GFR calc non Af Amer 6 (*) >90 (mL/min)     GFR calc Af Amer 7 (*) >90 (mL/min)   CARDIAC PANEL(CRET KIN+CKTOT+MB+TROPI)     Status: Normal   Collection Time   12/24/10  9:02 AM      Component Value Range Comment   Total CK 101  7 - 177 (U/L)    CK, MB 1.5  0.3 - 4.0 (ng/mL)    Troponin I <0.30  <0.30 (ng/mL)    Relative Index 1.5  0.0 - 2.5        Component Value Date/Time   SDES BLOOD ARM RIGHT 12/23/2010 2020   SPECREQUEST BOTTLES DRAWN AEROBIC AND ANAEROBIC 10CC 12/23/2010 2020   CULT  Value: GRAM POSITIVE COCCI IN CLUSTERS Note: Gram Stain Report Called to,Read Back By and Verified With: HEATHER KLENK @ 1215 12/24/10 WICKN 12/23/2010 2020   REPTSTATUS PENDING 12/23/2010 2020   Dg Chest 2 View  12/23/2010  *RADIOLOGY REPORT*  Clinical Data: Fever, confusion, hypoxia  CHEST - 2 VIEW  Comparison: 09/26/2010  Findings: Right lower lobe opacity, suspicious for pneumonia. No pleural effusion or pneumothorax.  Stable cardiomegaly.  Stable left-sided dual lumen dialysis catheter.  Mild degenerative changes of the visualized thoracolumbar spine.  IMPRESSION: Right lower lobe opacity, suspicious for pneumonia.  Stable cardiomegaly.  Original Report Authenticated By: Julian Hy, M.D.   Portable Chest Xray  12/24/2010  *RADIOLOGY REPORT*  Clinical Data: Unsuccessful right IJ line placement  PORTABLE CHEST - 1 VIEW  Comparison: 12/24/2010  Findings: Cardiomegaly with mild interstitial edema.  Right lower lobe opacity, possibly reflecting pneumonia.  Increasing left upper lobe/paramediastinal opacity, likely reflecting edema given right interval change.  No pneumothorax.  Stable left sided dual lumen dialysis catheter.  IMPRESSION: No pneumothorax following unsuccessful line placement.  Cardiomegaly with mild interstitial edema.  Right lower lobe opacity, possibly reflecting pneumonia.  Original Report Authenticated By: Julian Hy, M.D.   Dg Chest Port 1 View  12/24/2010  *RADIOLOGY REPORT*  Clinical Data: Attempted central line  placement.  PORTABLE CHEST - 1 VIEW  Comparison: 12/23/2010  Findings: Left subclavian large bore catheter with tips projecting over the mid and distal SVC.  No pneumothorax.  Cardiomegaly and prominent mediastinal contours, similar to prior. Interstitial prominence and bibasilar opacities.  Left  axilla surgical clips. No acute osseous abnormality.  IMPRESSION: No pneumothorax status post line attempt.  Otherwise stable chest.  Original Report Authenticated By: Suanne Marker, M.D.     Recent Results (from the past 720 hour(s))  CULTURE, BLOOD (ROUTINE X 2)     Status: Normal (Preliminary result)   Collection Time   12/23/10  8:10 PM      Component Value Range Status Comment   Specimen Description BLOOD ARM LEFT   Final    Special Requests BOTTLES DRAWN AEROBIC AND ANAEROBIC 10CC   Final    Setup Time [id number]   Final    Culture     Final    Value: GRAM POSITIVE COCCI IN CLUSTERS     Note: Gram Stain Report Called to,Read Back By and Verified WithMathews Robinsons @ [id number] 12/24/10 WICKN   Report Status PENDING   Incomplete   CULTURE, BLOOD (ROUTINE X 2)     Status: Normal (Preliminary result)   Collection Time   12/23/10  8:20 PM      Component Value Range Status Comment   Specimen Description BLOOD ARM RIGHT   Final    Special Requests BOTTLES DRAWN AEROBIC AND ANAEROBIC 10CC   Final    Setup Time [id number]   Final    Culture     Final    Value: GRAM POSITIVE COCCI IN CLUSTERS     Note: Gram Stain Report Called to,Read Back By and Verified WithMathews Robinsons @ [id number] 12/24/10 WICKN   Report Status PENDING   Incomplete   MRSA PCR SCREENING     Status: Normal   Collection Time   12/24/10  1:18 AM      Component Value Range Status Comment   MRSA by PCR NEGATIVE  NEGATIVE  Final      Impression/Recommendation  55 year old with HIV, AIDs, ESRD with complicated HD access and recurrent bacteremias, most recently MSSA with infected AV graft September sp removal of graft now with  likely recurrence of her MSSA bacteremia from infection of the the HD site in left IJ.   1) Staphylococcus Aureus Bacteremia: Likely MSSA once again and likely due to her IJ HD catheter site. The fact that this devloped after her aortogram could suggest introduction with this procedure, but regardless to clear her bacteremia her HD catheter will need to come out  --in interim agree with vancomycin and will also use ancef --will need discussion with [facility] about removal of her IJ and placement of new HD line once bactermia has cleared --one could push for TEE though I would still treat her for 6 weeks even if there is no obvious vegetation --will get 2Decho in interim --could consider chronic suppressive abx once finished with iv abx course  2) HIV/AIDS: Poorly compliant, NO pi mutations. I will changer her to prezista 800 norvir 100 along with her epivir and stavudine   3) Global issues: Pt needs help to actually take her ARVs and if she cannot then I think seriouis discussions re goals of care need to be raddressed. She is currently living with one daughter who does not know the pts status but is supposed to be helping mom take her meds. Pt herself has no clue what meds she is on and has not been to clinic since seen by Dr. Johnnye Sima in 2011. She is supposed to also be followed by the Teaching service in [facility]    Thank you so much for this interesting consult,  Alcide Evener 12/24/2010, 3:18 PM   [phone] (pager) [phone] (office)

## 2010-12-25 ENCOUNTER — Inpatient Hospital Stay (HOSPITAL_COMMUNITY): Payer: Medicaid Other

## 2010-12-25 DIAGNOSIS — R6520 Severe sepsis without septic shock: Secondary | ICD-10-CM

## 2010-12-25 DIAGNOSIS — R6521 Severe sepsis with septic shock: Secondary | ICD-10-CM

## 2010-12-25 DIAGNOSIS — A419 Sepsis, unspecified organism: Secondary | ICD-10-CM

## 2010-12-25 LAB — COMPREHENSIVE METABOLIC PANEL
ALT: 9 U/L (ref 0–35)
AST: 36 U/L (ref 0–37)
Albumin: 1.6 g/dL — ABNORMAL LOW (ref 3.5–5.2)
Alkaline Phosphatase: 129 U/L — ABNORMAL HIGH (ref 39–117)
BUN: 14 mg/dL (ref 6–23)
CO2: 29 mEq/L (ref 19–32)
Calcium: 7.7 mg/dL — ABNORMAL LOW (ref 8.4–10.5)
Chloride: 100 mEq/L (ref 96–112)
Creatinine, Ser: 3.14 mg/dL — ABNORMAL HIGH (ref 0.50–1.10)
GFR calc Af Amer: 18 mL/min — ABNORMAL LOW (ref >90)
GFR calc non Af Amer: 16 mL/min — ABNORMAL LOW (ref >90)
Glucose, Bld: 82 mg/dL (ref 70–99)
Potassium: 4 mEq/L (ref 3.5–5.1)
Sodium: 135 mEq/L (ref 135–145)
Total Bilirubin: 1.4 mg/dL — ABNORMAL HIGH (ref 0.3–1.2)
Total Protein: 8.2 g/dL (ref 6.0–8.3)

## 2010-12-25 LAB — CBC
HCT: 26.8 % — ABNORMAL LOW (ref 36.0–46.0)
Hemoglobin: 8.6 g/dL — ABNORMAL LOW (ref 12.0–15.0)
MCH: 32.3 pg (ref 26.0–34.0)
MCHC: 32.1 g/dL (ref 30.0–36.0)
MCV: 100.8 fL — ABNORMAL HIGH (ref 78.0–100.0)
Platelets: 178 10*3/uL (ref 150–400)
RBC: 2.66 MIL/uL — ABNORMAL LOW (ref 3.87–5.11)
RDW: 15.3 % (ref 11.5–15.5)
WBC: 9 10*3/uL (ref 4.0–10.5)

## 2010-12-25 LAB — PROCALCITONIN: Procalcitonin: 63.87 ng/mL

## 2010-12-25 MED ORDER — HEPARIN SODIUM (PORCINE) 1000 UNIT/ML DIALYSIS
40.0000 [IU]/kg | INTRAMUSCULAR | Status: DC | PRN
  Administered 2010-12-30: 2500 [IU] via INTRAVENOUS_CENTRAL
  Filled 2010-12-25: qty 3

## 2010-12-25 NOTE — Progress Notes (Signed)
Name: [name] MRN: [id number] DOB: [birth year]  LOS: 2  CRITICAL CARE PROGRESS NOTE  History of Present Illness:  55 y/o female with ESRD, HIV, MSSA bacteremia and multiple failed dialysis access came to the [facility] ED today with fever, confusion, myalgias.  She underwent a bilateral lower extremity runoff procedure today at [facility] to assess her dialysis access.  After receiving tylenol antibiotics and fluids in the ED she became more awake and alert.  PCCM asked to admit due to persistent hypotension. Blood cx positive for GPC clusters  Lines / Drains: R [place] HD cath (chronic) >> 12/23/10 L Fem CVL >>  Cultures / Sepsis markers: 12/17 procalcitonin: 11/35 12/17 blood cx >> 2/2 Staph aureus >>  Antibiotics: (per ID) 12/17 zosyn  >> 12/18 12/17 cipro >>12/18 12/17 vanc >>  12/18 Cephazolin >>    SUBJ: Looks great. Cognition intact. No distress. No new complaints. Remains on very low dose NE but BP more than adequate  Vital Signs:   Filed Vitals:   12/25/10 0900 12/25/10 1000 12/25/10 1100 12/25/10 1200  BP: 118/66 102/53 96/60 102/49  Pulse: 83 83 79 80  Temp:      TempSrc:      Resp: 23 20 20 20   Height:      Weight:      SpO2: 96% 96% 96% 95%     Physical Examination: Gen: chronically ill appearing, no respiratory distress HEENT: NCAT, PERRL, EOMi, MM dry  Neck: supple without masses PULM: Insp crackles in bases bilaterally CV: tachy, regular, loud systolic murmur at apex, JVD noted AB: BS+, soft, nontender, no hsm Ext: warm, no edema, no clubbing, no cyanosis, R groin with bandage in place, c/d/i Derm: no rash or skin breakdown Neuro: A&Ox4, CN II-XII intact, strength 5/5 in all 4 extremities Psyche: Normal mood and affect  Labs and Imaging:   CBC    Component Value Date/Time   WBC 9.0 12/25/2010 0500   RBC 2.66* 12/25/2010 0500   HGB 8.6* 12/25/2010 0500   HCT 26.8* 12/25/2010 0500   PLT 178 12/25/2010 0500   MCV 100.8* 12/25/2010 0500   MCH 32.3 12/25/2010  0500   MCHC 32.1 12/25/2010 0500   RDW 15.3 12/25/2010 0500   LYMPHSABS 0.9 12/23/2010 2021   MONOABS 0.3 12/23/2010 2021   EOSABS 0.0 12/23/2010 2021   BASOSABS 0.0 12/23/2010 2021    BMET    Component Value Date/Time   NA 135 12/25/2010 0500   K 4.0 12/25/2010 0500   CL 100 12/25/2010 0500   CO2 29 12/25/2010 0500   GLUCOSE 82 12/25/2010 0500   BUN 14 12/25/2010 0500   CREATININE 3.14* 12/25/2010 0500   CALCIUM 7.7* 12/25/2010 0500   CALCIUM 7.6* 09/14/2007 1321   GFRNONAA 16* 12/25/2010 0500   GFRAA 18* 12/25/2010 0500   CXR: Edema vs ALI pattern  Assessment and Plan: 55 y/o female presents with one day of achiness, confusion, fever, and evidence of a RLL pneumonia on CXR.  She has septic shock from HCAP.  DDx also includes influenza or other opportunistic respiratory infection.  Favor HCAP.  Very limited IV access.  Septic shock (12/24/2010)   Assessment: Due to Staph aureus bacteremia - likely related to HD access.    Plan: Weaning off pressors. Tolerate a lower MAP (55 mmHg) as she is ESRD pt. Cont Vanc, cefazolin per ID   HIV INFECTION (03/19/2007) -Cont antiretrovirals -ID following  ANEMIA OF CHRONIC DISEASE (03/19/2007)  No workup indicated @ present.  Cont to monitor. Transfuse for Hgb < 7.0   CONGESTIVE CARDIOMYOPATHY (03/19/2007)   Assessment: not overtly volume overloaded but CXR appearance is worrisome. Limit volume  ESRD (03/19/2007) Renal following. HD days TTS  She should be able to wean off NE today and transfer out of ICU. Would like to D/C femoral CVL once off pressors. Abx may be given with HD so CVL will not be necessary even if Vanc is to be continued (i.e. If this turns out to be MRSA). If MSSA, can probably transition to PO abx soon. Issue of whether removal of HD cath if feasible to be addressed by ID and [facility]. I will call IMTS to have them assume care as of AM 12/20   Merton Border, MD;  PCCM service; Mobile [phone]

## 2010-12-25 NOTE — ED Provider Notes (Signed)
Medical screening examination/treatment/procedure(s) were conducted as a shared visit with non-physician practitioner(s) and myself.  I personally evaluated the patient during the encounter  The patient arrived to the ED with altered loc, high fever of 104.  She has a history of HIV disease, end stage renal disease, and is on hemodialysis.  She had some sort of vascular procedure on her groin this morning.  She was borderline hypotensive and tachycardic.  For this reason, a code sepsis was initiated.  The appropriate cultures and labs were obtained and PCCM was consulted.  On exam, she was somewhat somnolent and speech was slurred.  The heart and lung exam was unremarkable, and the incision where the procedure was performed looked fine, without erythema, warmth, or redness.  I am unsure as to what the source of infection is, but due to her immunocompromised state, she was treated with broad spectrum antibiotics, vanc and zosyn.  She will be admitted to the ICU for further workup and treatment.  Veryl Speak, MD 12/25/10 [phone]

## 2010-12-25 NOTE — Progress Notes (Signed)
Subjective: No new complaints  Objective: Weight change: 0 lb (0 kg)  Intake/Output Summary (Last 24 hours) at 12/25/10 1441 Last data filed at 12/25/10 1400  Gross per 24 hour  Intake  497.8 ml  Output      0 ml  Net  497.8 ml   Blood pressure 100/57, pulse 76, temperature 99.4 F (37.4 C), temperature source Oral, resp. rate 21, height 5\' 3"  (1.6 m), weight 140 lb 3.4 oz (63.6 kg), SpO2 94.00%. Temp:  [98.1 F (36.7 C)-100.3 F (37.9 C)] 99.4 F (37.4 C) (12/19 1200) Pulse Rate:  [67-91] 76  (12/19 1400) Resp:  [15-24] 21  (12/19 1400) BP: (76-126)/(33-66) 100/57 mmHg (12/19 1400) SpO2:  [90 %-100 %] 94 % (12/19 1400) Weight:  [140 lb 3.4 oz (63.6 kg)] 140 lb 3.4 oz (63.6 kg) (12/18 1840)  Physical Exam: General: Alert and awake, oriented x3, not in any acute distress. HEENT: anicteric sclera, pupils reactive to light and accommodation, EOMI CVS regular rate, normal r,  no murmur rubs or gallops Chest: relatively clear to auscultation bilaterally, no wheezing, rales or rhonchi Abdomen: soft nontender, nondistended, normal bowel sounds, Extremities: no  clubbing or edema noted bilaterally Skin: no rashes, HD catheter in chest is cdi without purulence, IJ in groin is also clean Lymph: no new lymphadenopathy Neuro: nonfocal  Lab Results:  Basename 12/25/10 0500 12/24/10 0530  WBC 9.0 14.7*  HGB 8.6* 9.3*  HCT 26.8* 29.3*  PLT 178 230   BMET  Basename 12/25/10 0500 12/24/10 0530  NA 135 130*  K 4.0 3.5  CL 100 95*  CO2 29 24  GLUCOSE 82 90  BUN 14 39*  CREATININE 3.14* 6.72*  CALCIUM 7.7* 7.5*    Micro Results: Recent Results (from the past 240 hour(s))  CULTURE, BLOOD (ROUTINE X 2)     Status: Normal (Preliminary result)   Collection Time   12/23/10  8:10 PM      Component Value Range Status Comment   Specimen Description BLOOD ARM LEFT   Final    Special Requests BOTTLES DRAWN AEROBIC AND ANAEROBIC 10CC   Final    Setup Time [id number]   Final    Culture     Final    Value: STAPHYLOCOCCUS AUREUS     Note: RIFAMPIN AND GENTAMICIN SHOULD NOT BE USED AS SINGLE DRUGS FOR TREATMENT OF STAPH INFECTIONS.     Note: Gram Stain Report Called to,Read Back By and Verified With: [facility] @ [id number] 12/24/10 WICKN   Report Status PENDING   Incomplete   CULTURE, BLOOD (ROUTINE X 2)     Status: Normal (Preliminary result)   Collection Time   12/23/10  8:20 PM      Component Value Range Status Comment   Specimen Description BLOOD ARM RIGHT   Final    Special Requests BOTTLES DRAWN AEROBIC AND ANAEROBIC 10CC   Final    Setup Time [id number]   Final    Culture     Final    Value: STAPHYLOCOCCUS AUREUS     Note: Gram Stain Report Called to,Read Back By and Verified WithMathews Robinsons @ [id number] 12/24/10 WICKN   Report Status PENDING   Incomplete   MRSA PCR SCREENING     Status: Normal   Collection Time   12/24/10  1:18 AM      Component Value Range Status Comment   MRSA by PCR NEGATIVE  NEGATIVE  Final     Studies/Results: Dg Chest 2 View  12/23/2010  *RADIOLOGY REPORT*  Clinical Data: Fever, confusion, hypoxia  CHEST - 2 VIEW  Comparison: 09/26/2010  Findings: Right lower lobe opacity, suspicious for pneumonia. No pleural effusion or pneumothorax.  Stable cardiomegaly.  Stable left-sided dual lumen dialysis catheter.  Mild degenerative changes of the visualized thoracolumbar spine.  IMPRESSION: Right lower lobe opacity, suspicious for pneumonia.  Stable cardiomegaly.  Original Report Authenticated By: Julian Hy, M.D.   Dg Chest Port 1 View  12/25/2010  *RADIOLOGY REPORT*  Clinical Data: Respiratory failure  PORTABLE CHEST - 1 VIEW  Comparison: Chest radiograph 12/24/2010  Findings: The split lumen left dialysis catheters unchanged. Stable enlarged heart silhouette.  Mild pulmonary edema pattern is similar to prior.  No pneumothorax.  IMPRESSION: No interval change.  Cardiomegaly and mild pulmonary edema.  Original Report Authenticated By:  Suzy Bouchard, M.D.   Portable Chest Xray  12/24/2010  *RADIOLOGY REPORT*  Clinical Data: Unsuccessful right IJ line placement  PORTABLE CHEST - 1 VIEW  Comparison: 12/24/2010  Findings: Cardiomegaly with mild interstitial edema.  Right lower lobe opacity, possibly reflecting pneumonia.  Increasing left upper lobe/paramediastinal opacity, likely reflecting edema given right interval change.  No pneumothorax.  Stable left sided dual lumen dialysis catheter.  IMPRESSION: No pneumothorax following unsuccessful line placement.  Cardiomegaly with mild interstitial edema.  Right lower lobe opacity, possibly reflecting pneumonia.  Original Report Authenticated By: Julian Hy, M.D.   Dg Chest Port 1 View  12/24/2010  *RADIOLOGY REPORT*  Clinical Data: Attempted central line placement.  PORTABLE CHEST - 1 VIEW  Comparison: 12/23/2010  Findings: Left subclavian large bore catheter with tips projecting over the mid and distal SVC.  No pneumothorax.  Cardiomegaly and prominent mediastinal contours, similar to prior. Interstitial prominence and bibasilar opacities.  Left axilla surgical clips. No acute osseous abnormality.  IMPRESSION: No pneumothorax status post line attempt.  Otherwise stable chest.  Original Report Authenticated By: Suanne Marker, M.D.    Antibiotics:  Anti-infectives     Start     Dose/Rate Route Frequency Ordered Stop   12/26/10 1200   ceFAZolin (ANCEF) IVPB 2 g/50 mL premix        2 g 100 mL/hr over 30 Minutes Intravenous Every T-Th-Sa (Hemodialysis) 12/24/10 1629     12/25/10 1000  sulfamethoxazole-trimethoprim (BACTRIM DS) 800-160 MG per tablet 1 tablet       1 tablet Oral Every M-W-F 12/24/10 0121     12/24/10 1800   vancomycin (VANCOCIN) 750 mg in sodium chloride 0.9 % 150 mL IVPB        750 mg 150 mL/hr over 60 Minutes Intravenous Every T-Th-Sa (Hemodialysis) 12/24/10 1253     12/24/10 1800   ceFAZolin (ANCEF) IVPB 2 g/50 mL premix        2 g 100 mL/hr over 30  Minutes Intravenous  Once 12/24/10 1625     12/24/10 1545   darunavir (PREZISTA) tablet 800 mg        800 mg Oral Daily 12/24/10 1539     12/24/10 1330   cefTRIAXone (ROCEPHIN) 1 g in dextrose 5 % 50 mL IVPB  Status:  Discontinued        1 g 100 mL/hr over 30 Minutes Intravenous Every 24 hours 12/24/10 1219 12/25/10 0936   12/24/10 1000   lamiVUDine (EPIVIR) 10 MG/ML solution 25 mg        25 mg Oral Daily 12/24/10 0121     12/24/10 1000   stavudine (ZERIT) capsule 20 mg  20 mg Oral Daily 12/24/10 0121     12/24/10 1000   oseltamivir (TAMIFLU) capsule 75 mg  Status:  Discontinued        75 mg Oral Daily 12/24/10 0121 12/24/10 1048   12/24/10 0800   atazanavir (REYATAZ) capsule 300 mg  Status:  Discontinued        300 mg Oral Daily with breakfast 12/24/10 0121 12/24/10 1539   12/24/10 0800   ritonavir (NORVIR) tablet 100 mg        100 mg Oral Daily with breakfast 12/24/10 0121     12/24/10 0600   piperacillin-tazobactam (ZOSYN) IVPB 2.25 g  Status:  Discontinued        2.25 g 100 mL/hr over 30 Minutes Intravenous 3 times per day 12/24/10 0144 12/24/10 1219   12/24/10 0230   vancomycin (VANCOCIN) 500 mg in sodium chloride 0.9 % 100 mL IVPB        500 mg 100 mL/hr over 60 Minutes Intravenous  Once 12/24/10 0144 12/24/10 0357   12/24/10 0230   ciprofloxacin (CIPRO) IVPB 400 mg  Status:  Discontinued        400 mg 200 mL/hr over 60 Minutes Intravenous Every 24 hours 12/24/10 0144 12/24/10 1219   12/24/10 0130   vancomycin (VANCOCIN) IVPB 1000 mg/200 mL premix  Status:  Discontinued        1,000 mg 200 mL/hr over 60 Minutes Intravenous  Once 12/24/10 0120 12/24/10 0138   12/23/10 2045   vancomycin (VANCOCIN) IVPB 1000 mg/200 mL premix        1,000 mg 200 mL/hr over 60 Minutes Intravenous  Once 12/23/10 2034 12/23/10 2246   12/23/10 2045   piperacillin-tazobactam (ZOSYN) IVPB 3.375 g        3.375 g 12.5 mL/hr over 240 Minutes Intravenous  Once 12/23/10 2034 12/24/10 0145            Medications: Scheduled Meds:   . aspirin EC  81 mg Oral Daily  . B-complex with vitamin C  1 tablet Oral Daily  . ceFAZolin (ANCEF) IV  2 g Intravenous Once  . ceFAZolin (ANCEF) IV  2 g Intravenous Q T,Th,Sa-HD  . darbepoetin      . darbepoetin (ARANESP) injection - DIALYSIS  100 mcg Intravenous Q Tue-HD  . darunavir  800 mg Oral Daily  . famotidine  20 mg Oral Q24H  . ferric glucontate (NULECIT) IV  125 mg Intravenous Q Thu-HD  . heparin  5,000 Units Subcutaneous Q8H  . lamiVUDine  25 mg Oral Daily  . multivitamin  1 tablet Oral QHS  . ritonavir  100 mg Oral Q breakfast  . stavudine  20 mg Oral Daily  . sulfamethoxazole-trimethoprim  1 tablet Oral Q M,W,F  . vancomycin  750 mg Intravenous Q T,Th,Sa-HD  . DISCONTD: atazanavir  300 mg Oral Q breakfast  . DISCONTD: cefTRIAXone (ROCEPHIN)  IV  1 g Intravenous Q24H   Continuous Infusions:   . norepinephrine (LEVOPHED) Adult infusion Stopped (12/25/10 0900)   PRN Meds:.sodium chloride, acetaminophen, alteplase, calcium carbonate (dosed in mg elemental calcium), camphor-menthol, docusate sodium, feeding supplement (NEPRO CARB STEADY), heparin, heparin, heparin, hydrOXYzine, lidocaine, lidocaine-prilocaine, ondansetron (ZOFRAN) IV, ondansetron, pentafluoroprop-tetrafluoroeth, sorbitol, zolpidem  Assessment/Plan: [name] is a 55 y.o. 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 SHe is yet again  bacteremic with S. Aureus  (likely same MSSA) and undoubtedly involving the IJ HD catheter that was placed during that September admission  1) S. Aureus bacteremia:  -i have put in page for Dr. Bridgett Larsson from [facility] to ask if they can take out the HD catheter [place] access is problematic in this pt  --she will once again require a 6 week course of IV abx (ancef vs vanco depending on  organism) with day #1 = day of first negative blood culture post removal of HD  Catheter, after finising course I would consider putting her on suppressive po antibiotics. To be clear her current bacteremia SHOULD NOT BE TREATED WITH ORAL antibiotics but IV antibiotic  2) HIV: continue new regimen, spoke with Marlowe Kays from [facility] and she could help with pt post dc from hospital   LOS: 2 days   Alcide Evener 12/25/2010, 2:41 PM

## 2010-12-25 NOTE — Progress Notes (Signed)
Clinical Social Worker completed psychosocial assessment, please see shadow chart for details.  CSW faxed pt out to [facility].  CSW to continue to follow and assist as needed.  Dala Dock, MSW, [place]

## 2010-12-25 NOTE — Progress Notes (Signed)
Patient ID: [name], female   DOB: 01-21-1955, 55 y.o.   MRN: [id number]  S:Reports to be 'feeling better' although not quite sure why she is here. Denies any CP/SOB/N/V. Tolerated HD well yesterday.   O: BP 110/61  Pulse 81  Temp(Src) 99.2 F (37.3 C) (Oral)  Resp 18  Ht 5\' 3"  (1.6 m)  Wt 63.6 kg (140 lb 3.4 oz)  BMI 24.84 kg/m2  SpO2 96%  [id number] resting in bed, on midodrine [id number] RRR, Normal S1 and S2 sounds Resp:Coarse BS with fine rales Left base. No wheeze/retractions [id number], flat, NT, BS normal [id number] leathery stasis dermopathy with 1+ edema      . aspirin EC  81 mg Oral Daily  . B-complex with vitamin C  1 tablet Oral Daily  . ceFAZolin (ANCEF) IV  2 g Intravenous Once  . ceFAZolin (ANCEF) IV  2 g Intravenous Q T,Th,Sa-HD  . cefTRIAXone (ROCEPHIN)  IV  1 g Intravenous Q24H  . darbepoetin      . darbepoetin (ARANESP) injection - DIALYSIS  100 mcg Intravenous Q Tue-HD  . darunavir  800 mg Oral Daily  . famotidine  20 mg Oral Q24H  . ferric glucontate (NULECIT) IV  125 mg Intravenous Q Thu-HD  . heparin  5,000 Units Subcutaneous Q8H  . lamiVUDine  25 mg Oral Daily  . multivitamin  1 tablet Oral QHS  . ritonavir  100 mg Oral Q breakfast  . stavudine  20 mg Oral Daily  . sulfamethoxazole-trimethoprim  1 tablet Oral Q M,W,F  . vancomycin  750 mg Intravenous Q T,Th,Sa-HD  . DISCONTD: atazanavir  300 mg Oral Q breakfast  . DISCONTD: ciprofloxacin  400 mg Intravenous Q24H  . DISCONTD: famotidine  20 mg Oral Daily  . DISCONTD: oseltamivir  75 mg Oral Daily  . DISCONTD: piperacillin-tazobactam (ZOSYN)  IV  2.25 g Intravenous Q8H    BMET  Lab 12/25/10 0500 12/24/10 0530 12/24/10 0150 12/23/10 2021 12/23/10 0821  NA 135 130* 133* 130* 137  K 4.0 3.5 3.6 3.8 3.8  CL 100 95* 98 94* 98  CO2 29 24 22 25  --  GLUCOSE 82 90 70 79 75  BUN 14 39* 37* 35* 33*  CREATININE 3.14* 6.72* 6.37* 6.22* 4.80*  ALB -- -- -- -- --  CALCIUM 7.7* 7.5* 7.5* 7.8*  --  PHOS -- -- -- -- --   CBC  Lab 12/25/10 0500 12/24/10 0530 12/24/10 0150 12/23/10 2021  WBC 9.0 14.7* 13.3* 9.0  NEUTROABS -- -- -- 7.9*  HGB 8.6* 9.3* 8.9* 9.8*  HCT 26.8* 29.3* 28.2* 30.6*  MCV 100.8* 103.2* 102.2* 102.0*  PLT 178 230 209 236    @IMGRELPRIORS @  Assessment/Plan: 1.Hypotension with altered mental status/Sepsis syndrome: Preliminary blood cultures 2/2 positive for GPC in clusters- currently attempting to wean off levophed and transition to out-pt doses of midodrine. On ancef/rocephin as we await sensitivity studies. Improving BP trends noted and low grade febrile (100.3) overnight in this immunosuppressed patient. 2.ESRD: Tolerated HD without problems yesterday, plan to repeat dialysis here tomorrow 3. Anemia:On ESA/Iron, no overt losses. Will monitor trends. 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 Nutrition:Monitor nutritional intake and use ONS as needed.  Odie Rauen K. 8:35 AM

## 2010-12-26 ENCOUNTER — Inpatient Hospital Stay (HOSPITAL_COMMUNITY): Payer: Medicaid Other

## 2010-12-26 DIAGNOSIS — N186 End stage renal disease: Secondary | ICD-10-CM

## 2010-12-26 DIAGNOSIS — A419 Sepsis, unspecified organism: Secondary | ICD-10-CM

## 2010-12-26 DIAGNOSIS — R7881 Bacteremia: Secondary | ICD-10-CM

## 2010-12-26 DIAGNOSIS — R6521 Severe sepsis with septic shock: Secondary | ICD-10-CM

## 2010-12-26 LAB — RENAL FUNCTION PANEL
Albumin: 1.7 g/dL — ABNORMAL LOW (ref 3.5–5.2)
BUN: 24 mg/dL — ABNORMAL HIGH (ref 6–23)
CO2: 28 mEq/L (ref 19–32)
Calcium: 7.5 mg/dL — ABNORMAL LOW (ref 8.4–10.5)
Chloride: 100 mEq/L (ref 96–112)
Creatinine, Ser: 5.15 mg/dL — ABNORMAL HIGH (ref 0.50–1.10)
GFR calc Af Amer: 10 mL/min — ABNORMAL LOW (ref >90)
GFR calc non Af Amer: 9 mL/min — ABNORMAL LOW (ref >90)
Glucose, Bld: 73 mg/dL (ref 70–99)
Phosphorus: 2.7 mg/dL (ref 2.3–4.6)
Potassium: 3.8 mEq/L (ref 3.5–5.1)
Sodium: 132 mEq/L — ABNORMAL LOW (ref 135–145)

## 2010-12-26 LAB — CBC
HCT: 25.7 % — ABNORMAL LOW (ref 36.0–46.0)
Hemoglobin: 8.3 g/dL — ABNORMAL LOW (ref 12.0–15.0)
MCH: 32.8 pg (ref 26.0–34.0)
MCHC: 32.3 g/dL (ref 30.0–36.0)
MCV: 101.6 fL — ABNORMAL HIGH (ref 78.0–100.0)
Platelets: 174 10*3/uL (ref 150–400)
RBC: 2.53 MIL/uL — ABNORMAL LOW (ref 3.87–5.11)
RDW: 15.3 % (ref 11.5–15.5)
WBC: 3.3 10*3/uL — ABNORMAL LOW (ref 4.0–10.5)

## 2010-12-26 LAB — CULTURE, BLOOD (ROUTINE X 2)
Culture  Setup Time: 201212180233
Culture  Setup Time: 201212180234

## 2010-12-26 MED ORDER — DARUNAVIR ETHANOLATE 400 MG PO TABS
800.0000 mg | ORAL_TABLET | Freq: Every day | ORAL | Status: DC
  Administered 2010-12-26 – 2010-12-31 (×6): 800 mg via ORAL
  Filled 2010-12-26 (×7): qty 2

## 2010-12-26 NOTE — H&P (Signed)
Hospital Admission Note Date: 12/26/2010  Patient name: [name] Medical record number: [id number] Date of birth: [birth year] Age: 55 y.o. Gender: female PCP: Janell Quiet, MD  Medical Service:  Attending physician:  Dr. Scharlene Gloss    1st Contact:  Dr. Nicoletta Dress               Pager: [phone] 2nd Contact:  Dr. 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 She presents with persistent hypotension and was admitted to [facility] service.  Her X ray showed RLL PNA with B/L heterogeneous opacities. Her Influenza A, B ,H1N1 were all negative.  Patient had septic shock which was treated with sepsis protocol including levophed, IV steroids, pan culture and broaden ABX including Vancomycin, Zosyn and Cipro for HCAP. ID was consulted on her HIV management ( her last HIV viral load 99900/CD4 270 with 19% CD4 helper T cell)and renal consulted on her ESRD with HD. Her Blood culture 2/2 indicates positive MSSA and his ABX regimen was changed to Ancef. Since her sepsis is thought to be related to her HD catheter. The treatment of Ancef will need 6 weeks with the first day counting with 1st negative blood culture. Her RIJ HD catheter will need to be removed and new access will be evaluated by renal and VVS.   Patient's condition has largely improved after treatment , and her septic shock is completely resolved. She will be transferred to [facility] for further management.   History of HD access October 2009,    LUA AVG  November 2009  left axillary artery to radial artery bypass  May 2010            RUA AVF failed June 2010           RUA AVG  with multiple thrombectomies of her graft.  September          RUA AVG removal 2/2 infection, Left IJ HD placed.  Lines / Drains:  12/23/10 L Fem CVL >>   Cultures / Sepsis markers:  12/23/10 blood cx x2 positive for MSSA  12/23/10 procalcitonin and lactate acid WNL 12/18 MRSA PCR Negative. 12/18 Influenza panel A, B, H1N1 all Negative  12/18 cortisol, Fibrinogen WNL 12/19, procalcitonin 63.87  Antibiotics:  12/23/10 vanc (HCAP) >> 12/25/10 12/23/10 zosyn (HCAP) >> 12/18 12/23/10 cipro (HCAP) >> 12/18 12/24/10 Ceftriaxone >> 12/19 12/24/10 Ancef  Aortagram with B/L LE runoff FINDINGS:  1. No significant aortoiliac occlusive disease.  2. Patent right superficial femoral artery deep femoral artery common femoral artery. There is 2 vessel runoff on the right via the posterior tibial and peroneal arteries. The right anterior tibial artery is not visualized.  3. On the left side, the common femoral, superficial femoral, deep femoral artery, popliteal and tibial vessels are patent. There is three-vessel runoff on the left via the anterior tibial, posterior tibial, and peroneal arteries.  [name], MD, FACS  [facility]  Tests / Events:  12/23/10 CXR RLL pneumonia, bilateral heterogeneous opacities, Stable cardiomegaly 12/24/10 CXR Right lower lobe opacity, Cardiomegaly with  mild interstitial edema 12/25/10 CXR Cardiomegaly and mild pulmonary edema.   Consultation: VVS Dr. Manuela Schwartz kidney association Dr. Drucilla Schmidt.  Meds: Medications Prior to Admission  Medication Dose Route Frequency Provider Last Rate Last Dose  . 0.9 %  sodium chloride infusion  250 mL Intravenous PRN Simonne Maffucci, MD      . acetaminophen (TYLENOL) tablet 650 mg  650 mg Oral Once Joaquim Lai C. [place], [place]   650 mg at 12/23/10 2155  . acetaminophen (TYLENOL) tablet 650 mg  650 mg Oral Q8H PRN Simonne Maffucci, MD   650 mg at 12/24/10 2041  . alteplase (CATHFLO  ACTIVASE) injection 2 mg  2 mg Intracatheter Once PRN Luvenia Heller, FNP      . aspirin EC tablet 81 mg  81 mg Oral Daily Simonne Maffucci, MD   81 mg at 12/26/10 1740  . B-complex with vitamin C tablet 1 tablet  1 tablet Oral Daily Bronson Curb [place], [place]   1 tablet at 12/26/10 [id number]  . calcium carbonate (dosed in mg elemental calcium) suspension 500 mg of elemental calcium  500 mg of elemental calcium Oral Q6H PRN Luvenia Heller, FNP      . camphor-menthol (SARNA) lotion 1 application  1 application Topical [id number] PRN Luvenia Heller, FNP       And  . hydrOXYzine (ATARAX/VISTARIL) tablet 25 mg  25 mg Oral Q8H PRN Luvenia Heller, FNP      . ceFAZolin (ANCEF) IVPB 2 g/50 mL premix  2 g Intravenous Q T,Th,Sa-HD Manley Mason, PHARMD   2 g at 12/26/10 1739  . darbepoetin (ARANESP) 100 MCG/0.5ML injection           . darbepoetin (ARANESP) injection 100 mcg  100 mcg Intravenous Q Tue-HD Luvenia Heller, FNP   100 mcg at 12/24/10 1509  . darunavir (PREZISTA) tablet 800 mg  800 mg Oral Q breakfast Alcide Evener, MD   800 mg at 12/26/10 [id number]  . docusate sodium (ENEMEEZ) enema 283 mg  1 enema Rectal PRN Luvenia Heller, FNP      . famotidine (PEPCID) tablet 20 mg  20 mg Oral Q24H Minh [place], [place]   20 mg at 12/25/10 1915  . feeding supplement (NEPRO CARB STEADY) liquid 237 mL  237 mL Oral TID PRN Luvenia Heller, FNP      . ferric gluconate (NULECIT) 125 mg in sodium chloride 0.9 % 100 mL IVPB  125 mg Intravenous Q Thu-HD Luvenia Heller, FNP   125 mg at 12/26/10 1345  . heparin 2-0.9 UNIT/ML-% infusion           . heparin injection 1,000 Units  1,000 Units Dialysis PRN Luvenia Heller, FNP      . heparin injection 1,300 Units  20 Units/kg Dialysis PRN Luvenia Heller, FNP   1,300 Units at 12/24/10 1553  . heparin injection 2,500 Units  40 Units/kg Dialysis PRN Ulice Dash K. Posey Pronto, MD      . heparin injection 5,000 Units  5,000 Units Subcutaneous Q8H Simonne Maffucci, MD   5,000 Units at 12/26/10 1742  . lamiVUDine (EPIVIR) 10 MG/ML  solution 25 mg  25 mg Oral Daily Simonne Maffucci, MD   25 mg at 12/26/10 0917  . lidocaine (XYLOCAINE) 1 % injection 5 mL  5 mL Intradermal PRN Luvenia Heller, FNP      . lidocaine (XYLOCAINE) 1 % injection           .  lidocaine-prilocaine (EMLA) cream 1 application  1 application Topical PRN Luvenia Heller, FNP      . multivitamin (RENA-VIT) tablet 1 tablet  1 tablet Oral QHS Simonne Maffucci, MD   1 tablet at 12/25/10 2200  . norepinephrine (LEVOPHED) 8 mg in dextrose 5 % 250 mL infusion  5-25 mcg/min Intravenous To Major Simonne Maffucci, MD 37.5 mL/hr at 12/24/10 0200 20 mcg/min at 12/24/10 0200  . ondansetron (ZOFRAN) tablet 4 mg  4 mg Oral Q6H PRN Luvenia Heller, FNP       Or  . ondansetron [facility]) injection 4 mg  4 mg Intravenous Q6H PRN Luvenia Heller, FNP      . pentafluoroprop-tetrafluoroeth (GEBAUERS) aerosol 1 application  1 application Topical PRN Luvenia Heller, FNP      . piperacillin-tazobactam (ZOSYN) IVPB 3.375 g  3.375 g Intravenous Once Joaquim Lai C. [place], [place]   3.375 g at 12/23/10 2145  . ritonavir (NORVIR) tablet 100 mg  100 mg Oral Q breakfast Simonne Maffucci, MD   100 mg at 12/26/10 0917  . sodium chloride 0.9 % bolus 1,000 mL  1,000 mL Intravenous Once Joaquim Lai C. [place], [place]   1,000 mL at 12/23/10 2118  . sorbitol 70 % solution 30 mL  30 mL Oral PRN Luvenia Heller, FNP      . stavudine (ZERIT) capsule 20 mg  20 mg Oral Daily Simonne Maffucci, MD   20 mg at 12/26/10 1741  . sulfamethoxazole-trimethoprim (BACTRIM DS) 800-160 MG per tablet 1 tablet  1 tablet Oral Q M,W,F Simonne Maffucci, MD   1 tablet at 12/25/10 0949  . vancomycin (VANCOCIN) 500 mg in sodium chloride 0.9 % 100 mL IVPB  500 mg Intravenous Once Rande Lawman Rumbarger, PHARMD   500 mg at 12/24/10 0257  . vancomycin (VANCOCIN) IVPB 1000 mg/200 mL premix  1,000 mg Intravenous Once Joaquim Lai C. [place], [place]   1,000 mg at 12/23/10 2146  . zolpidem (AMBIEN) tablet 5 mg  5 mg Oral QHS PRN Luvenia Heller, FNP      . DISCONTD: 0.9 %  sodium  chloride infusion  250 mL Intravenous PRN Simonne Maffucci, MD 20 mL/hr at 12/24/10 0200 250 mL at 12/24/10 0200  . DISCONTD: 0.9 %  sodium chloride infusion  100 mL Intravenous PRN Luvenia Heller, FNP      . DISCONTD: 0.9 %  sodium chloride infusion  100 mL Intravenous PRN Luvenia Heller, FNP      . DISCONTD: acetaminophen (TYLENOL) tablet 650 mg  650 mg Oral Q4H PRN Angelia Mould, MD      . DISCONTD: atazanavir (REYATAZ) capsule 300 mg  300 mg Oral Q breakfast Simonne Maffucci, MD   300 mg at 12/24/10 [id number]  . DISCONTD: b complex vitamins tablet 1 tablet  1 tablet Oral Daily Simonne Maffucci, MD      . DISCONTD: ceFAZolin (ANCEF) IVPB 2 g/50 mL premix  2 g Intravenous Once Manley Mason, PHARMD      . DISCONTD: cefTRIAXone (ROCEPHIN) 1 g in dextrose 5 % 50 mL IVPB  1 g Intravenous Q24H Merton Border, MD   1 g at 12/24/10 1913  . DISCONTD: ciprofloxacin (CIPRO) IVPB 400 mg  400 mg Intravenous Q24H Rande Lawman Rumbarger, PHARMD   400 mg at 12/24/10 0352  . DISCONTD: darunavir (PREZISTA) tablet 800 mg  800 mg Oral Daily Alcide Evener, MD   800 mg at 12/25/10 0950  . DISCONTD: famotidine (PEPCID) tablet 20  mg  20 mg Oral Daily Simonne Maffucci, MD   20 mg at 12/24/10 1053  . DISCONTD: norepinephrine (LEVOPHED) 16 mg in dextrose 5 % 250 mL infusion  5 mcg/min Intravenous Continuous Merton Border, MD   2.5 mcg/min at 12/25/10 0800  . DISCONTD: norepinephrine (LEVOPHED) 8 mg in dextrose 5 % 250 mL infusion  5-25 mcg/min Intravenous Continuous Elizabeth Deterding 4.7 mL/hr at 12/24/10 1100 5 mcg/min at 12/24/10 1100  . DISCONTD: ondansetron (ZOFRAN) injection 4 mg  4 mg Intravenous Q6H PRN Angelia Mould, MD      . DISCONTD: oseltamivir (TAMIFLU) capsule 75 mg  75 mg Oral Daily Simonne Maffucci, MD      . DISCONTD: oxyCODONE-acetaminophen (PERCOCET) 5-325 MG per tablet 1-2 tablet  1-2 tablet Oral Q4H PRN Angelia Mould, MD   2 tablet at 12/23/10 1247  . DISCONTD: piperacillin-tazobactam  (ZOSYN) IVPB 2.25 g  2.25 g Intravenous Q8H Rande Lawman Rumbarger, PHARMD   2.25 g at 12/24/10 [id number]  . DISCONTD: sodium chloride 0.9 % injection 3 mL  3 mL Intravenous PRN Angelia Mould, MD      . DISCONTD: sodium chloride 0.9 % injection 3 mL  3 mL Intravenous Q12H Simonne Maffucci, MD      . DISCONTD: sodium chloride 0.9 % injection 3 mL  3 mL Intravenous PRN Simonne Maffucci, MD      . DISCONTD: vancomycin (VANCOCIN) 750 mg in sodium chloride 0.9 % 150 mL IVPB  750 mg Intravenous Q T,Th,Sa-HD Juanda Chance Amend, PHARMD   750 mg at 12/24/10 1802  . DISCONTD: vancomycin (VANCOCIN) IVPB 1000 mg/200 mL premix  1,000 mg Intravenous Once Simonne Maffucci, MD      . DISCONTD: vasopressin (PITRESSIN) 50 Units in sodium chloride 0.9 % 250 mL infusion  0.03 Units/min Intravenous Continuous PRN Simonne Maffucci, MD   0.03 Units/min at 12/24/10 0800   Medications Prior to Admission  Medication Sig Dispense Refill  . acetaminophen (TYLENOL) 650 MG CR tablet Take 650 mg by mouth every 8 (eight) hours as needed. For pain.      [name] aspirin 81 MG tablet Take 81 mg by mouth daily.        [name] atazanavir (REYATAZ) 300 MG capsule Take 300 mg by mouth daily with breakfast.        . b complex vitamins tablet Take 1 tablet by mouth daily.        [name] b complex-vitamin c-folic acid (NEPHRO-VITE) 0.8 MG TABS Take 0.8 mg by mouth daily.        . darbepoetin (ARANESP, ALBUMIN FREE,) 150 MCG/0.3ML SOLN Inject 150 mcg into the skin. Use weekly for Epogen 20000 units. Dialysis nurse to administer.       [name] ENSURE (ENSURE) Take 237 mLs by mouth 3 (three) times daily between meals.        . famotidine (PEPCID) 20 MG tablet Take 20 mg by mouth daily.        . iron sucrose (VENOFER) 20 MG/ML injection 100 mg IV on Tuesday HD(HD RN to dispense)       . lamiVUDine (EPIVIR) 10 MG/ML solution Take 1 teaspoon by mouth every day.       . paricalcitol (ZEMPLAR) 5 MCG/ML injection 4 micrograms IV- dispensed by dialysis nurse.       .  ritonavir (NORVIR) 100 MG TABS Take 100 mg by mouth daily with breakfast.       . Stavudine (ZERIT) 1 MG/ML SOLR Take 4 teaspoons(20 ml)  by mouth every day.       . sulfamethoxazole-trimethoprim (BACTRIM DS) 800-160 MG per tablet Take 1 tablet by mouth daily. Take 1 tablet by mouth every Monday, Wednesday and Friday.        Allergies: Review of patient's allergies indicates no active allergies. Past Medical History  Diagnosis Date  . Human immunodeficiency virus (HIV) disease   . Hypertension   . ESRD (end stage renal disease)   . Dialysis patient     pt on dialysis since 2010.  [name] Clostridium difficile infection 04/04/10  . Bacteriuria, asymptomatic 04/04/10    Culture grew VRE sensitive to linesolid   . MGUS (monoclonal gammopathy of unknown significance)   . History of bacteremia     MSSA  . Renal insufficiency   . Anemia    Past Surgical History  Procedure Date  . Av fistula placement    Family History  Problem Relation Age of Onset  . Alcohol abuse      family h/o addiction/alcoholism  . Diabetes Cousin     first degree relatives  . Kidney disease Mother   . Kidney disease Maternal Uncle   . Cancer Sister    History   Social History  . Marital Status: Single    Spouse Name: N/A    Number of Children: N/A  . Years of Education: N/A   Occupational History  . Not on file.   Social History Main Topics  . Smoking status: Never Smoker   . Smokeless tobacco: Not on file  . Alcohol Use: No  . Drug Use: No  . Sexually Active: Not on file   Other Topics Concern  . Not on file   Social History Narrative   Lives with daughter(does not know dx). Oldest daughter does not dx.Current partner of may years,? fidelity    Review of Systems: See HPI   Physical Exam: Blood pressure 118/60, pulse 67, temperature 97.8 F (36.6 C), temperature source Oral, resp. rate 17, height 5\' 3"  (1.6 m), weight 138 lb 0.1 oz (62.6 kg), SpO2 94.00%.  General: alert, well-developed,  and cooperative to examination.  Head: normocephalic and atraumatic.  Eyes: vision grossly intact, pupils equal, pupils round, pupils reactive to light, no injection and anicteric.  Mouth: pharynx pink and moist, no erythema, and no exudates.  Neck: supple, full ROM, no thyromegaly, no JVD, and no carotid bruits.  Lungs: normal respiratory effort, no accessory muscle use, normal breath sounds, no crackles, and no wheezes.            Chest HD cathter and Femoral CVL noted and WNL. Heart: normal rate, regular rhythm, no murmur, no gallop, and no rub.  Abdomen: soft, non-tender, normal bowel sounds, no distention, no guarding, no rebound tenderness, no hepatomegaly, and no splenomegaly.  Msk: no joint swelling, no joint warmth, and no redness over joints.  Pulses: 2+ DP/PT pulses bilaterally Extremities: No cyanosis, clubbing, edema Neurologic: alert & oriented X3, cranial nerves II-XII intact, strength normal in all extremities, sensation intact to light touch, and gait normal.  Skin: turgor normal and no rashes.  Psych: Oriented X3, memory intact for recent and remote, normally interactive, good eye contact, not anxious appearing, and not depressed appearing.    Lab results: Basic Metabolic Panel:  Basename 12/26/10 1243 12/25/10 0500  Marilyn Nihiser 132* 135  K 3.8 4.0  CL 100 100  CO2 28 29  GLUCOSE 73 82  BUN 24* 14  CREATININE 5.15* 3.14*  CALCIUM 7.5* 7.7*  MG -- --  PHOS 2.7 --   Liver Function Tests:  Basename 12/26/10 1243 12/25/10 0500 12/24/10 0150  AST -- 36 36  ALT -- 9 11  ALKPHOS -- 129* 144*  BILITOT -- 1.4* 0.5  PROT -- 8.2 8.6*  ALBUMIN 1.7* 1.6* --    Basename 12/24/10 0150  LIPASE 64*  AMYLASE 357*   No results found for this basename: AMMONIA:2 in the last 72 hours CBC:  Basename 12/26/10 1242 12/25/10 0500 12/23/10 2021  WBC 3.3* 9.0 --  NEUTROABS -- -- 7.9*  HGB 8.3* 8.6* --  HCT 25.7* 26.8* --  MCV 101.6* 100.8* --  PLT 174 178 --   Cardiac  Enzymes:  Basename 12/24/10 0902 12/24/10 0150  CKTOTAL 101 89  CKMB 1.5 1.2  CKMBINDEX -- --  TROPONINI <0.30 <0.30   Coagulation:  Basename 12/24/10 0150  LABPROT 16.6*  INR 1.32   Imaging results:  Dg Chest Port 1 View  12/25/2010  *RADIOLOGY REPORT*  Clinical Data: Respiratory failure  PORTABLE CHEST - 1 VIEW  Comparison: Chest radiograph 12/24/2010  Findings: The split lumen left dialysis catheters unchanged. Stable enlarged heart silhouette.  Mild pulmonary edema pattern is similar to prior.  No pneumothorax.  IMPRESSION: No interval change.  Cardiomegaly and mild pulmonary edema.  Original Report Authenticated By: Suzy Bouchard, M.D.    Assessment & Plan by Problem:  # Septic shock: likely related to HD access, resolved.      Patient presents with Sepsis and septic shock with Blood culture showing MSSA. She was treated with broaden IV ABX initially and her therapy was switched to Ancef upon receiving her blood culture. She was also treated with pressors and steroids. Her chest X ray shows RLL PNA. Her septic shock has completely resolved.  # HCAP, see above note.  # MSSA bacteremia: Likely due to her HD catheter.    -continue Ancef IV, likely 6 weeks. With Day #1 = day of  first negative blood culture post removal of HD Catheter,    - Appreciate ID inputs! May consider chronic suppressive abx per ID.    - VVS follow up for possible new HD line placement    - May consider 2D echo  #. HIV INFECTION (03/19/2007), medical non-compliant.     -Continue new regimen HAART medication    - appreciate ID inputs   - long term plan to ensure compliance.  # ANEMIA OF CHRONIC DISEASE (03/19/2007)    Likely due to ESRD and HIV. Renal managing, indication of transfusion if HGB< 7.0 per renal.    Will follow CBC.    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 Current HD access is at [facility]. Given the fact that this HD catheter could be the source of her sepsis, this catheter will need to be removed. VVS and renal to follow for possible new HD access.  # Metabolic bone disease   ca/phos 7.5/2.7   Appreciate renal input  #Malnutrition  Albumin 2.0>1.8>1.6, appetite remains poor - nutritional consult. - renal follow up     # elevation of lipase 64 and amylase 357.    Will follow for resolution.  Deposition:   SNF  Signed: Kahari Critzer 12/26/2010, 7:54 PM

## 2010-12-26 NOTE — Progress Notes (Signed)
Name: [name] MRN: [id number] DOB: [birth year]  LOS: 3  CRITICAL CARE PROGRESS NOTE  History of Present Illness:  55 y/o female with ESRD, HIV, MSSA bacteremia and multiple failed dialysis access came to the [facility] ED today with fever, confusion, myalgias.  She underwent a bilateral lower extremity runoff procedure today at [facility] to assess her dialysis access.  After receiving tylenol antibiotics and fluids in the ED she became more awake and alert.  PCCM asked to admit due to persistent hypotension. Blood cx positive for GPC clusters  Lines / Drains: R [place] HD cath (chronic) >> 12/23/10 L Fem CVL >> 12/20  Cultures / Sepsis markers: 12/17 procalcitonin: 11.35, 12/19: 63.87 12/17 blood cx >> 2/2 MSSA  Antibiotics: (per ID) 12/17 zosyn  >> 12/18 12/17 cipro >>12/18 12/17 vanc >> 12/19 12/18 Cephazolin >>     SUBJ: Off pressors. No new complaints  Vital Signs:   Filed Vitals:   12/26/10 0400 12/26/10 0500 12/26/10 0600 12/26/10 0800  BP: 113/66 127/67 122/69 123/74  Pulse: 69 70 73 64  Temp:   98.6 F (37 C)   TempSrc:      Resp: 17 18 15 20   Height:      Weight:      SpO2: 95% 96% 96% 97%     Physical Examination: Gen: chronically ill appearing, no distress HEENT: WNL Neck: supple without masses PULM: Insp crackles in bases bilaterally CV: tachy, regular, loud systolic murmur at apex, JVD noted AB: BS+, soft, nontender, no hsm Ext: warm, no edema, no clubbing, no cyanosis, R groin with bandage in place, c/d/i Derm: no rash or skin breakdown Neuro: A&Ox4, CN II-XII intact, strength 5/5 in all 4 extremities Psyche: Normal mood and affect  Labs and Imaging:   CBC    Component Value Date/Time   WBC 9.0 12/25/2010 0500   RBC 2.66* 12/25/2010 0500   HGB 8.6* 12/25/2010 0500   HCT 26.8* 12/25/2010 0500   PLT 178 12/25/2010 0500   MCV 100.8* 12/25/2010 0500   MCH 32.3 12/25/2010 0500   MCHC 32.1 12/25/2010 0500   RDW 15.3 12/25/2010 0500   LYMPHSABS 0.9 12/23/2010  2021   MONOABS 0.3 12/23/2010 2021   EOSABS 0.0 12/23/2010 2021   BASOSABS 0.0 12/23/2010 2021    BMET    Component Value Date/Time   NA 135 12/25/2010 0500   K 4.0 12/25/2010 0500   CL 100 12/25/2010 0500   CO2 29 12/25/2010 0500   GLUCOSE 82 12/25/2010 0500   BUN 14 12/25/2010 0500   CREATININE 3.14* 12/25/2010 0500   CALCIUM 7.7* 12/25/2010 0500   CALCIUM 7.6* 09/14/2007 1321   GFRNONAA 16* 12/25/2010 0500   GFRAA 18* 12/25/2010 0500   CXR: Edema vs ALI pattern  Assessment and Plan:  Septic shock (12/24/2010)   Assessment: Due to Staph aureus bacteremia - likely related to HD access.    Plan: Off pressors. D/C Vanc. Needs 6 wks cephazolin per ID. Issue of HD access to be addressed by ID,Renal, VVS  HIV INFECTION (03/19/2007) -Cont antiretrovirals -ID following  ANEMIA OF CHRONIC DISEASE (03/19/2007)  No workup indicated @ present. Cont to monitor. Transfuse for Hgb < 7.0. Minimize phlebotomy  CONGESTIVE CARDIOMYOPATHY (03/19/2007) Assessment: not overtly volume overloaded.  ESRD (03/19/2007) Renal following. HD days TTS  She is ready to transfer out of ICU to reg bed. Will transfer to IMTS today. PCCM will sign. Please call if we can be of further assistance   Merton Border,  MD;  PCCM service; Mobile [phone]

## 2010-12-26 NOTE — Progress Notes (Signed)
Internal Medicine Teaching Service Attending Note Date: 12/26/2010  Patient name: [name]  Medical record number: [id number]  Date of birth: 14-Feb-1955    This patient has been seen and discussed with the house staff. Please see their note for complete details. I concur with their findings:  Patient received in transfer from CCM.  55 yo with HIV and ESRD and hx of infections and difficult access.  Here now with sepsis, no off pressors and out of ICU.  She has MSSA in blood.  I certainly agree with Drs. Tommy Medal and Posey Pronto that ideally the patient will have a catheter holiday after dialysis today until Monday in an attempt to clear the infection and avoid as much as possible future similar episodes.  The patient is reluctant but needs to have catheter removed and only peripheral access over the weekend until new catheter placed.  She will need 6 weeks of IV ancef once catheter removed and blood cultures are negative.    [name] 12/26/2010, 11:29 AM

## 2010-12-26 NOTE — Progress Notes (Signed)
Patient ID: [name], female   DOB: 13-Apr-1955, 55 y.o.   MRN: [id number]  S:Reports to be feeling well and inquires about going home. Denies CP/SOB   O: BP 123/74  Pulse 64  Temp(Src) 98.6 F (37 C) (Oral)  Resp 20  Ht 5\' 3"  (1.6 m)  Wt 63.6 kg (140 lb 3.4 oz)  BMI 24.84 kg/m2  SpO2 97%  [id number] resting in bed having eaten b/fast [id number] RRR, S1 and S2 without M/R/G Resp:CTA bilaterally, no rales [id number], obese, NT, BS normal Ext: No LE edema      . aspirin EC  81 mg Oral Daily  . B-complex with vitamin C  1 tablet Oral Daily  . ceFAZolin (ANCEF) IV  2 g Intravenous Q T,Th,Sa-HD  . darbepoetin (ARANESP) injection - DIALYSIS  100 mcg Intravenous Q Tue-HD  . darunavir  800 mg Oral Q breakfast  . famotidine  20 mg Oral Q24H  . ferric glucontate (NULECIT) IV  125 mg Intravenous Q Thu-HD  . heparin  5,000 Units Subcutaneous Q8H  . lamiVUDine  25 mg Oral Daily  . multivitamin  1 tablet Oral QHS  . ritonavir  100 mg Oral Q breakfast  . stavudine  20 mg Oral Daily  . sulfamethoxazole-trimethoprim  1 tablet Oral Q M,W,F  . DISCONTD: ceFAZolin (ANCEF) IV  2 g Intravenous Once  . DISCONTD: cefTRIAXone (ROCEPHIN)  IV  1 g Intravenous Q24H  . DISCONTD: darunavir  800 mg Oral Daily  . DISCONTD: vancomycin  750 mg Intravenous Q T,Th,Sa-HD    BMET  Lab 12/25/10 0500 12/24/10 0530 12/24/10 0150 12/23/10 2021 12/23/10 0821  NA 135 130* 133* 130* 137  K 4.0 3.5 3.6 3.8 3.8  CL 100 95* 98 94* 98  CO2 29 24 22 25  --  GLUCOSE 82 90 70 79 75  BUN 14 39* 37* 35* 33*  CREATININE 3.14* 6.72* 6.37* 6.22* 4.80*  ALB -- -- -- -- --  CALCIUM 7.7* 7.5* 7.5* 7.8* --  PHOS -- -- -- -- --   CBC  Lab 12/25/10 0500 12/24/10 0530 12/24/10 0150 12/23/10 2021  WBC 9.0 14.7* 13.3* 9.0  NEUTROABS -- -- -- 7.9*  HGB 8.6* 9.3* 8.9* 9.8*  HCT 26.8* 29.3* 28.2* 30.6*  MCV 100.8* 103.2* 102.2* 102.0*  PLT 178 230 209 236   Blood Culture    Component Value Date/Time   SDES BLOOD  ARM RIGHT 12/23/2010 2020   SPECREQUEST BOTTLES DRAWN AEROBIC AND ANAEROBIC 10CC 12/23/2010 2020   CULT  Value: STAPHYLOCOCCUS AUREUS Note: SUSCEPTIBILITIES PERFORMED ON PREVIOUS CULTURE WITHIN THE LAST 5 DAYS. Note: Gram Stain Report Called to,Read Back By and Verified With: HEATHER [facility] @ [id number] 12/24/10 WICKN 12/23/2010 2020   REPTSTATUS 12/26/2010 FINAL 12/23/2010 2020      Assessment/Plan:  1.Hypotension with altered mental status/Sepsis syndrome: Blood cultures 2/2 positive for staphylococcus (likely MSSA)-  weaned off levophed and transitioned to out-pt doses of midodrine. On ancef and currently afebrile. The biggest challenge is her difficult vascular access status which limits our ability to give her a catheter holiday. If possible, she would dialyze today, have the catheter taken out later and have a new one placed on Monday after being monitored here in the hospital for volume/hyperkalemia if vascular surgery feel that we could re-establish HD access. 2.ESRD: On schedule for dialysis today, no acute needs noted 3. Anemia:On ESA/Iron, no overt losses. Will monitor trends.  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 Nutrition:Monitor nutritional intake and use ONS as needed.  Kaye Luoma K.

## 2010-12-27 DIAGNOSIS — T827XXA Infection and inflammatory reaction due to other cardiac and vascular devices, implants and grafts, initial encounter: Secondary | ICD-10-CM

## 2010-12-27 MED ORDER — PRO-STAT SUGAR FREE PO LIQD
30.0000 mL | Freq: Two times a day (BID) | ORAL | Status: DC
  Administered 2010-12-29 – 2010-12-31 (×5): 30 mL via ORAL
  Filled 2010-12-27 (×12): qty 30

## 2010-12-27 MED ORDER — BOOST / RESOURCE BREEZE PO LIQD
1.0000 | Freq: Two times a day (BID) | ORAL | Status: DC
  Administered 2010-12-28 – 2010-12-31 (×5): 1 via ORAL

## 2010-12-27 NOTE — Progress Notes (Signed)
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 PMH: HIV, CKD stg IV, HTN, cardiomyopathy. Pt on HD schedule TTHS.  Anticoagulation: Heparin sq q8 hrs for DVT prophylaxis Infectious Diseases: BCx=MSSA x2/2, vanco dc'd, now starting IV cefazolin(D#4/42), WBC 3.3, tmax 99.1.  follow up repeat BCx to document clearance. Checking with VVS to see if HD cath can be removed (note mentions next Tuesday). HIV regimen changed d/t adherence issues, renal dosing of ARV appropriate. Bactrim for PCP prophylaxis. Repeat bld cxs to be drawn x2 Neurology: No acute distress, normal mood/affect Pulmonary: 93% RA Cardiovascular: Hypotension improved, BP 115/69  HR 68 off pressors Gastrointestinal: PO famotidine Fluids/Electrolytes/Nutrition: Lytes: Na+ 132. Other lytes WNL Nephrology: ESRD-HD TTS Hematology/Oncology: anemia of CKD, H/H stable, Aranesp Best Practices: heparin sq q8, PO famotidine, home meds addressed  Plan: - Continue IV cefazolin 2gm qHD x6weeks - Follow-up repeat BCx   Gerrit Halls, PharmD 12/27/2010,12:16 PM   No Active Allergies  Patient Measurements: Height: 5\' 3"  (160 cm) Weight: 136 lb 7.4 oz (61.9 kg) IBW/kg (Calculated) : 52.4   Vital Signs: Temp: 98.8 F (37.1 C) (12/21 1038) Temp src: Oral (12/21 1038) BP: 115/69 mmHg (12/21 1038) Pulse Rate: 68  (12/21 1038) Intake/Output from previous day: 12/20 0701 - 12/21 0700 In: 200 [P.O.:100; IV Piggyback:100] Out: 2000  Intake/Output from this shift: Total I/O In: 120 [P.O.:120] Out: -   Labs:  Basename 12/26/10 1243 12/26/10 1242 12/25/10 0500  WBC -- 3.3* 9.0  HGB -- 8.3* 8.6*  PLT -- 174 178  LABCREA -- -- --  CREATININE 5.15* -- 3.14*   Estimated Creatinine Clearance: 10.3 ml/min (by C-G formula based on Cr of 5.15). No  results found for this basename: VANCOTROUGH:2,VANCOPEAK:2,VANCORANDOM:2,GENTTROUGH:2,GENTPEAK:2,GENTRANDOM:2,TOBRATROUGH:2,TOBRAPEAK:2,TOBRARND:2,AMIKACINPEAK:2,AMIKACINTROU:2,AMIKACIN:2, in the last 72 hours   Microbiology: Recent Results (from the past 720 hour(s))  CULTURE, BLOOD (ROUTINE X 2)     Status: Normal   Collection Time   12/23/10  8:10 PM      Component Value Range Status Comment   Specimen Description BLOOD ARM LEFT   Final    Special Requests BOTTLES DRAWN AEROBIC AND ANAEROBIC 10CC   Final    Setup Time [id number]   Final    Culture     Final    Value: STAPHYLOCOCCUS AUREUS     Note: RIFAMPIN AND GENTAMICIN SHOULD NOT BE USED AS SINGLE DRUGS FOR TREATMENT OF STAPH INFECTIONS.     Note: Gram Stain Report Called to,Read Back By and Verified With: [place] @ 1215 12/24/10 WICKN   Report Status 12/26/2010 FINAL   Final    Organism ID, Bacteria STAPHYLOCOCCUS AUREUS   Final   CULTURE, BLOOD (ROUTINE X 2)     Status: Normal   Collection Time   12/23/10  8:20 PM      Component Value Range Status Comment   Specimen Description BLOOD ARM RIGHT   Final    Special Requests BOTTLES DRAWN AEROBIC AND ANAEROBIC 10CC   Final    Setup Time [id number]   Final    Culture     Final    Value: STAPHYLOCOCCUS AUREUS     Note: SUSCEPTIBILITIES PERFORMED ON PREVIOUS CULTURE WITHIN THE LAST 5 DAYS.     Note: Gram Stain Report Called to,Read Back By and Verified WithMathews Robinsons @ 1215 12/24/10 [facility]   Report Status 12/26/2010  FINAL   Final   MRSA PCR SCREENING     Status: Normal   Collection Time   12/24/10  1:18 AM      Component Value Range Status Comment   MRSA by PCR NEGATIVE  NEGATIVE  Final     Anti-infectives     Start     Dose/Rate Route Frequency Ordered Stop   12/26/10 1200   ceFAZolin (ANCEF) IVPB 2 g/50 mL premix        2 g 100 mL/hr over 30 Minutes Intravenous Every T-Th-Sa (Hemodialysis) 12/24/10 1629     12/26/10 0915   darunavir (PREZISTA) tablet 800 mg         800 mg Oral Daily with breakfast 12/26/10 0905     12/25/10 1000  sulfamethoxazole-trimethoprim (BACTRIM DS) 800-160 MG per tablet 1 tablet       1 tablet Oral Every M-W-F 12/24/10 0121     12/24/10 1800   vancomycin (VANCOCIN) 750 mg in sodium chloride 0.9 % 150 mL IVPB  Status:  Discontinued        750 mg 150 mL/hr over 60 Minutes Intravenous Every T-Th-Sa (Hemodialysis) 12/24/10 1253 12/26/10 0847   12/24/10 1800   ceFAZolin (ANCEF) IVPB 2 g/50 mL premix  Status:  Discontinued        2 g 100 mL/hr over 30 Minutes Intravenous  Once 12/24/10 1625 12/26/10 0903   12/24/10 1545   darunavir (PREZISTA) tablet 800 mg  Status:  Discontinued        800 mg Oral Daily 12/24/10 1539 12/26/10 0903   12/24/10 1330   cefTRIAXone (ROCEPHIN) 1 g in dextrose 5 % 50 mL IVPB  Status:  Discontinued        1 g 100 mL/hr over 30 Minutes Intravenous Every 24 hours 12/24/10 1219 12/25/10 0936   12/24/10 1000   lamiVUDine (EPIVIR) 10 MG/ML solution 25 mg        25 mg Oral Daily 12/24/10 0121     12/24/10 1000   stavudine (ZERIT) capsule 20 mg        20 mg Oral Daily 12/24/10 0121     12/24/10 1000   oseltamivir (TAMIFLU) capsule 75 mg  Status:  Discontinued        75 mg Oral Daily 12/24/10 0121 12/24/10 1048   12/24/10 0800   atazanavir (REYATAZ) capsule 300 mg  Status:  Discontinued        300 mg Oral Daily with breakfast 12/24/10 0121 12/24/10 1539   12/24/10 0800   ritonavir (NORVIR) tablet 100 mg        100 mg Oral Daily with breakfast 12/24/10 0121     12/24/10 0600   piperacillin-tazobactam (ZOSYN) IVPB 2.25 g  Status:  Discontinued        2.25 g 100 mL/hr over 30 Minutes Intravenous 3 times per day 12/24/10 0144 12/24/10 1219   12/24/10 0230   vancomycin (VANCOCIN) 500 mg in sodium chloride 0.9 % 100 mL IVPB        500 mg 100 mL/hr over 60 Minutes Intravenous  Once 12/24/10 0144 12/24/10 0357   12/24/10 0230   ciprofloxacin (CIPRO) IVPB 400 mg  Status:  Discontinued        400  mg 200 mL/hr over 60 Minutes Intravenous Every 24 hours 12/24/10 0144 12/24/10 1219   12/24/10 0130   vancomycin (VANCOCIN) IVPB 1000 mg/200 mL premix  Status:  Discontinued        1,000 mg 200 mL/hr over  60 Minutes Intravenous  Once 12/24/10 0120 12/24/10 0138   12/23/10 2045   vancomycin (VANCOCIN) IVPB 1000 mg/200 mL premix        1,000 mg 200 mL/hr over 60 Minutes Intravenous  Once 12/23/10 2034 12/23/10 2246   12/23/10 2045   piperacillin-tazobactam (ZOSYN) IVPB 3.375 g        3.375 g 12.5 mL/hr over 240 Minutes Intravenous  Once 12/23/10 2034 12/24/10 0145

## 2010-12-27 NOTE — Progress Notes (Signed)
[facility] SHORT STAY H&P  Chief Complaint  Patient presents with  . 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 She presents with persistent hypotension and was admitted to [facility] service. Her X ray showed RLL PNA with B/L heterogeneous opacities. Her Influenza A, B ,H1N1 were all negative. Patient had septic shock which was treated with sepsis protocol including levophed, IV steroids, pan culture and broaden ABX including Vancomycin, Zosyn and Cipro for HCAP. ID was consulted on her HIV management ( her last HIV viral load 99900/CD4 270 with 19% CD4 helper T cell)and renal consulted on her ESRD with HD. Her Blood culture 2/2 indicates positive MSSA and his ABX regimen was changed to Ancef. Since her sepsis is thought to be related to her HD catheter. The treatment of Ancef will need 6 weeks with the first day counting with 1st negative blood culture. Her RIJ HD catheter will need to be removed and new access will be evaluated by renal and VVS.    Past Medical History  Diagnosis Date  . Human immunodeficiency virus (HIV) disease   . Hypertension   . ESRD (end stage renal disease)   . Dialysis patient     pt on dialysis since 2010.  [name] Clostridium difficile infection 04/04/10  . Bacteriuria, asymptomatic 04/04/10    Culture grew VRE sensitive to linesolid   . MGUS (monoclonal gammopathy of unknown significance)   . History of bacteremia     MSSA  . Renal insufficiency   . Anemia     FH:  Non-Contributory  History   Social History  . Marital Status: Single    Spouse Name: N/A    Number of Children: N/A  . Years of Education: N/A   Occupational History  . Not on file.   Social History Main Topics  . Smoking status:  Never Smoker   . Smokeless tobacco: Not on file  . Alcohol Use: No  . Drug Use: No  . Sexually Active: Not on file   Other Topics Concern  . Not on file   Social History Narrative   Lives with daughter(does not know dx). Oldest daughter does not dx.Current partner of may years,? fidelity    No Active Allergies  Current Facility-Administered Medications  Medication Dose Route Frequency Provider Last Rate Last Dose  . 0.9 %  sodium chloride infusion  250 mL Intravenous PRN Simonne Maffucci, MD      . acetaminophen (TYLENOL) tablet 650 mg  650 mg Oral Q8H PRN Simonne Maffucci, MD   650 mg at 12/24/10 2041  . aspirin EC tablet 81 mg  81 mg Oral Daily Simonne Maffucci, MD   81 mg at 12/26/10 1740  . B-complex with vitamin C tablet 1 tablet  1 tablet Oral Daily Bronson Curb [place], [place]   1 tablet at 12/26/10 [id number]  . calcium carbonate (dosed in mg elemental calcium) suspension 500 mg of elemental calcium  500 mg of elemental calcium Oral Q6H PRN Luvenia Heller, FNP      . camphor-menthol (SARNA) lotion 1 application  1 application Topical [id number] PRN Luvenia Heller, FNP       And  . hydrOXYzine (ATARAX/VISTARIL) tablet 25 mg  25 mg Oral Q8H PRN  Luvenia Heller, FNP      . ceFAZolin (ANCEF) IVPB 2 g/50 mL premix  2 g Intravenous Q T,Th,Sa-HD Manley Mason, PHARMD   2 g at 12/26/10 1739  . darbepoetin (ARANESP) injection 100 mcg  100 mcg Intravenous Q Tue-HD Luvenia Heller, FNP   100 mcg at 12/24/10 1509  . darunavir (PREZISTA) tablet 800 mg  800 mg Oral Q breakfast Alcide Evener, MD   800 mg at 12/26/10 [id number]  . docusate sodium (ENEMEEZ) enema 283 mg  1 enema Rectal PRN Luvenia Heller, FNP      . famotidine (PEPCID) tablet 20 mg  20 mg Oral Q24H Minh [place], [place]   20 mg at 12/26/10 2252  . feeding supplement (NEPRO CARB STEADY) liquid 237 mL  237 mL Oral TID PRN Luvenia Heller, FNP      . ferric gluconate (NULECIT) 125 mg in sodium chloride 0.9 % 100 mL IVPB  125 mg Intravenous Q Thu-HD Luvenia Heller, FNP    125 mg at 12/26/10 1345  . heparin injection 1,000 Units  1,000 Units Dialysis PRN Luvenia Heller, FNP      . heparin injection 1,300 Units  20 Units/kg Dialysis PRN Luvenia Heller, FNP   1,300 Units at 12/24/10 1553  . heparin injection 2,500 Units  40 Units/kg Dialysis PRN Ulice Dash K. Posey Pronto, MD      . heparin injection 5,000 Units  5,000 Units Subcutaneous Q8H Simonne Maffucci, MD   5,000 Units at 12/27/10 [id number]  . lamiVUDine (EPIVIR) 10 MG/ML solution 25 mg  25 mg Oral Daily Simonne Maffucci, MD   25 mg at 12/26/10 0917  . lidocaine (XYLOCAINE) 1 % injection 5 mL  5 mL Intradermal PRN Luvenia Heller, FNP      . lidocaine-prilocaine (EMLA) cream 1 application  1 application Topical PRN Luvenia Heller, FNP      . multivitamin (RENA-VIT) tablet 1 tablet  1 tablet Oral QHS Simonne Maffucci, MD   1 tablet at 12/26/10 2200  . ondansetron (ZOFRAN) tablet 4 mg  4 mg Oral Q6H PRN Luvenia Heller, FNP       Or  . ondansetron [facility]) injection 4 mg  4 mg Intravenous Q6H PRN Luvenia Heller, FNP      . pentafluoroprop-tetrafluoroeth (GEBAUERS) aerosol 1 application  1 application Topical PRN Luvenia Heller, FNP      . ritonavir (NORVIR) tablet 100 mg  100 mg Oral Q breakfast Simonne Maffucci, MD   100 mg at 12/26/10 0917  . sorbitol 70 % solution 30 mL  30 mL Oral PRN Luvenia Heller, FNP      . stavudine (ZERIT) capsule 20 mg  20 mg Oral Daily Simonne Maffucci, MD   20 mg at 12/26/10 1741  . sulfamethoxazole-trimethoprim (BACTRIM DS) 800-160 MG per tablet 1 tablet  1 tablet Oral Q M,W,F Simonne Maffucci, MD   1 tablet at 12/25/10 0949  . zolpidem (AMBIEN) tablet 5 mg  5 mg Oral QHS PRN Luvenia Heller, FNP      . DISCONTD: ceFAZolin (ANCEF) IVPB 2 g/50 mL premix  2 g Intravenous Once Manley Mason, [place]: darunavir (PREZISTA) tablet 800 mg  800 mg Oral Daily Alcide Evener, MD   800 mg at 12/25/10 0950  . DISCONTD: norepinephrine (LEVOPHED) 16 mg in dextrose 5 % 250 mL infusion  5 mcg/min Intravenous Continuous Merton Border,  MD   2.5 mcg/min at 12/25/10 0800  . DISCONTD: vancomycin (VANCOCIN) 750 mg in sodium chloride 0.9 % 150 mL IVPB  750 mg Intravenous Q T,Th,Sa-HD Juanda Chance Amend, PHARMD   750 mg at 12/24/10 1802    ROS:  She HPI  PHYSICAL EXAM  Filed Vitals:   12/27/10 0500  BP: 136/68  Pulse: 63  Temp: 98 F (36.7 C)  Resp: 17    Gen:  NAD  HEENT:  normocephalic Neck: left diatek in place Lungs: non-labored Extremities: no edema Skin: no obvious rashes Neuro:  No deficits  Lab/X-ray:  n/a  Impression: This is a 54 y.o. female here for diatek catheter removal  Plan:  Removal of left diatek catheter  Evorn Gong, PA-C [facility] [phone] @TODAY @ 7:21 AM

## 2010-12-27 NOTE — Progress Notes (Signed)
Internal Medicine Teaching Service Attending Note Date: 12/27/2010  Patient name: [name]  Medical record number: [id number]  Date of birth: 28-Nov-1955    This patient has been seen and discussed with the house staff. Please see their note for complete details. I concur with their findings:  Patient on TTHS dialysis and will get dialysis tomorrow and have catheter removed until Tuesday.  She will then have blood cultures drawn daily until the first negative set and she will then need 6 weeks of Ancef from then.   COMER, ROBERT 12/27/2010, 11:29 AM

## 2010-12-27 NOTE — Progress Notes (Signed)
Subjective:  Patient feels ok, no c/o, no acute distress noted. No acute events overnight per nursing.  Objective: Vital signs in last 24 hours: Filed Vitals:   12/26/10 1800 12/26/10 1900 12/26/10 1950 12/27/10 0500  BP: 116/67 118/60 116/66 136/68  Pulse: 67 67 67 63  Temp:   97.8 F (36.6 C) 98 F (36.7 C)  TempSrc:   Oral Oral  Resp: 19 17 18 17   Height:   5\' 3"  (1.6 m)   Weight:   136 lb 7.4 oz (61.9 kg)   SpO2: 95% 94% 90% 91%   Weight change:   Intake/Output Summary (Last 24 hours) at 12/27/10 0737 Last data filed at 12/26/10 1739  Gross per 24 hour  Intake    200 ml  Output   2000 ml  Net  -1800 ml   Physical Exam:   Lab Results: Basic Metabolic Panel:  Lab [id number] 12/25/10 0500  [name] 132* 135  K 3.8 4.0  CL 100 100  CO2 28 29  GLUCOSE 73 82  BUN 24* 14  CREATININE 5.15* 3.14*  CALCIUM 7.5* 7.7*  MG -- --  PHOS 2.7 --   Liver Function Tests:  Lab 12/26/10 1243 12/25/10 0500 12/24/10 0150  AST -- 36 36  ALT -- 9 11  ALKPHOS -- 129* 144*  BILITOT -- 1.4* 0.5  PROT -- 8.2 8.6*  ALBUMIN 1.7* 1.6* --    Lab 12/24/10 0150  LIPASE 64*  AMYLASE 357*   CBC:  Lab 12/26/10 1242 12/25/10 0500 12/23/10 2021  WBC 3.3* 9.0 --  NEUTROABS -- -- 7.9*  HGB 8.3* 8.6* --  HCT 25.7* 26.8* --  MCV 101.6* 100.8* --  PLT 174 178 --   Cardiac Enzymes:  Lab 12/24/10 0902 12/24/10 0150  CKTOTAL 101 89  CKMB 1.5 1.2  CKMBINDEX -- --  TROPONINI <0.30 <0.30    Coagulation:  Lab 12/24/10 0150  LABPROT 16.6*  INR 1.32    Micro Results: Recent Results (from the past 240 hour(s))  CULTURE, BLOOD (ROUTINE X 2)     Status: Normal   Collection Time   12/23/10  8:10 PM      Component Value Range Status Comment   Specimen Description BLOOD ARM LEFT   Final    Special Requests BOTTLES DRAWN AEROBIC AND ANAEROBIC 10CC   Final    Setup Time [id number]   Final    Culture     Final    Value: STAPHYLOCOCCUS AUREUS     Note: RIFAMPIN AND GENTAMICIN  SHOULD NOT BE USED AS SINGLE DRUGS FOR TREATMENT OF STAPH INFECTIONS.     Note: Gram Stain Report Called to,Read Back By and Verified With: [facility] @ 1215 12/24/10 WICKN   Report Status 12/26/2010 FINAL   Final    Organism ID, Bacteria STAPHYLOCOCCUS AUREUS   Final   CULTURE, BLOOD (ROUTINE X 2)     Status: Normal   Collection Time   12/23/10  8:20 PM      Component Value Range Status Comment   Specimen Description BLOOD ARM RIGHT   Final    Special Requests BOTTLES DRAWN AEROBIC AND ANAEROBIC 10CC   Final    Setup Time [id number]   Final    Culture     Final    Value: STAPHYLOCOCCUS AUREUS     Note: SUSCEPTIBILITIES PERFORMED ON PREVIOUS CULTURE WITHIN THE LAST 5 DAYS.     Note: Gram Stain Report Called to,Read Back By and Verified With:  HEATHER [facility] @ [id number] 12/24/10 WICKN   Report Status 12/26/2010 FINAL   Final   MRSA PCR SCREENING     Status: Normal   Collection Time   12/24/10  1:18 AM      Component Value Range Status Comment   MRSA by PCR NEGATIVE  NEGATIVE  Final    Studies/Results: No results found. Medications: I have reviewed the patient's current medications. Scheduled Meds:   . aspirin EC  81 mg Oral Daily  . B-complex with vitamin C  1 tablet Oral Daily  . ceFAZolin (ANCEF) IV  2 g Intravenous Q T,Th,Sa-HD  . darbepoetin (ARANESP) injection - DIALYSIS  100 mcg Intravenous Q Tue-HD  . darunavir  800 mg Oral Q breakfast  . famotidine  20 mg Oral Q24H  . ferric glucontate (NULECIT) IV  125 mg Intravenous Q Thu-HD  . heparin  5,000 Units Subcutaneous Q8H  . lamiVUDine  25 mg Oral Daily  . multivitamin  1 tablet Oral QHS  . ritonavir  100 mg Oral Q breakfast  . stavudine  20 mg Oral Daily  . sulfamethoxazole-trimethoprim  1 tablet Oral Q M,W,F  . DISCONTD: ceFAZolin (ANCEF) IV  2 g Intravenous Once  . DISCONTD: darunavir  800 mg Oral Daily  . DISCONTD: vancomycin  750 mg Intravenous Q T,Th,Sa-HD   Continuous Infusions:   . DISCONTD: norepinephrine  (LEVOPHED) Adult infusion Stopped (12/25/10 0900)   PRN Meds:.sodium chloride, acetaminophen, calcium carbonate (dosed in mg elemental calcium), camphor-menthol, docusate sodium, feeding supplement (NEPRO CARB STEADY), heparin, heparin, heparin, hydrOXYzine, lidocaine, lidocaine-prilocaine, ondansetron (ZOFRAN) IV, ondansetron, pentafluoroprop-tetrafluoroeth, sorbitol, zolpidem Assessment/Plan:  # Septic shock: likely related to HD access, resolved.  Patient presents with Sepsis and septic shock with Blood culture showing MSSA. She was treated with broaden IV ABX initially and her therapy was switched to Ancef upon receiving her blood culture. She was also treated with pressors and steroids. Her chest X ray shows RLL PNA. Her septic shock has completely resolved.  # HCAP, see above note.  # MSSA bacteremia: Likely due to her HD catheter.  -continue Ancef IV, likely 6 weeks. With Day #1 = day of first negative blood culture post removal of HD Catheter,  - Appreciate ID inputs! May consider chronic suppressive abx per ID.  - VVS follow up for possible new HD line placement  - May consider 2D echo  #. HIV INFECTION (03/19/2007), medical non-compliant.  -Continue new regimen HAART medication  - appreciate ID inputs  - long term plan to ensure compliance.  # ANEMIA OF CHRONIC DISEASE (03/19/2007)  Likely due to ESRD and HIV. Renal managing, indication of transfusion if HGB< 7.0 per renal.  Will follow CBC.  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 [facility] HD access [facility] at [facility].  Given the fact that this HD catheter could be  the source of her sepsis, this catheter will need to be removed.  VVS and renal to follow for possible new HD access.  # Metabolic bone disease  ca/phos 7.5/2.7  Appreciate renal input  #Malnutrition  Albumin 2.0>1.8>1.6, appetite remains poor  - nutritional consult.  -  renal follow up  # elevation of lipase 64 and amylase 357.  Will follow for resolution.  Deposition:  SNF   LOS: 4 days   [name] 12/27/2010, 7:37 AM

## 2010-12-27 NOTE — Progress Notes (Addendum)
[facility] Catheter Removal Procedure Note  Diagnosis: ESRD  Plan:  Remove left diatek catheter  Consent signed:  yes Time out completed:  yes Coumadin:  no PT/INR (if applicable):  N/a Other labs:  plts 174 12/26/10  Procedure: 1.  Sterile prepping and draping over catheter area 2. 7 ml 2% lidocaine plain instilled at removal site. 3.  left catheter removed  4.  Complications:  Cuff did not come out with catheter 5. Tip of catheter sent for culture:  yes   Patient tolerated procedure well:  yes Pressure held, no bleeding noted, dressing applied Instructions given to the pt regarding wound care and bleeding.  Other:  Will have Dr. Scot Dock come back and perform a small cutdown to remove the cuff.  Evorn Gong 12/27/2010 7:24 AM   Addendum:  Dr. Scot Dock, under sterile technique, made incision and cuff was removed in its entirety without difficulty.  Pt tolerated well.  Plan for diatek placement on Monday.  9:06 AM 12/27/2010

## 2010-12-27 NOTE — Progress Notes (Addendum)
Subjective: No complaints, breathing well.  Objective: Vital signs in last 24 hours: Temp:  [97.8 F (36.6 C)-99.1 F (37.3 C)] 98 F (36.7 C) (12/21 0500) Pulse Rate:  [56-87] 63  (12/21 0500) Resp:  [13-22] 17  (12/21 0500) BP: (104-150)/(55-89) 136/68 mmHg (12/21 0500) SpO2:  [90 %-99 %] 91 % (12/21 0500) Weight:  [61.9 kg (136 lb 7.4 oz)-64.6 kg (142 lb 6.7 oz)] 136 lb 7.4 oz (61.9 kg) (12/20 1950) Weight change:   Intake/Output from previous day: 12/20 0701 - 12/21 0700 In: 200 [P.O.:100; IV Piggyback:100] Out: 2000    EXAM: General appearance:  Comfortable, alert. Resp:  CTA bilaterally Cardio:  RRR without murmur GI:  + BS, soft & nontender  Extremities:  No edema Access:  CVC @ [facility] pending removal  Lab Results:  Basename 12/26/10 1242 12/25/10 0500  WBC 3.3* 9.0  HGB 8.3* 8.6*  HCT 25.7* 26.8*  PLT 174 178   BMET:  Basename 12/26/10 1243 12/25/10 0500  NA 132* 135  K 3.8 4.0  CL 100 100  CO2 28 29  GLUCOSE 73 82  BUN 24* 14  CREATININE 5.15* 3.14*  CALCIUM 7.5* 7.7*  ALBUMIN 1.7* 1.6*   No results found for this basename: PTH:2 in the last 72 hours Iron Studies: No results found for this basename: IRON,TIBC,TRANSFERRIN,FERRITIN in the last 72 hours  Assessment/Plan: 1.  Hypotension/Sepsis - on Ancef for MSSA bacteremia, BP stable, afebrile, WBCs 3.3.  Left IJ catheter to be removed this AM and HD will be held until a new catheter is placed on Mon. 12/24. 2.  ESRD - s/p HD yesterday, currently stable with K of 3.8, no signs of fluid overload.  HD catheter to be removed this AM, so next HD will be on Mon after new catheter is placed. 3.  Anemia - on Aranesp on Tues, on Fe on Thurs., Hgb 8.3. 4.  Secondary hyperparathyroidism - Off binders. 5.  Nutrition - Alb 1.7, chronic. 6.  HIV - followed by ID.              LOS: 4 days   LYLES,CHARLES 12/27/2010,7:39 AM   I have seen and examined this patient and agree with plan as outlined by Ramiro Harvest  PA, plan on catheter holiday with monitoring over weekend prior to new tunneled catheter on Monday or tuesday. Will repeat blood cultures today Janalee Grobe K.,MD 12/27/2010 11:57 AM

## 2010-12-27 NOTE — Progress Notes (Signed)
INITIAL ADULT NUTRITION ASSESSMENT Date: 12/27/2010   Time: 9:46 AM  Reason for Assessment: MD consult  ASSESSMENT: Female 55 y.o.  Dx: Septic shock  Hx:  Past Medical History  Diagnosis Date  . Human immunodeficiency virus (HIV) disease   . Hypertension   . ESRD (end stage renal disease)   . Dialysis patient     pt on dialysis since 2010.  [name] Clostridium difficile infection 04/04/10  . Bacteriuria, asymptomatic 04/04/10    Culture grew VRE sensitive to linesolid   . MGUS (monoclonal gammopathy of unknown significance)   . History of bacteremia     MSSA  . Renal insufficiency   . Anemia     Related Meds:     . aspirin EC  81 mg Oral Daily  . B-complex with vitamin C  1 tablet Oral Daily  . ceFAZolin (ANCEF) IV  2 g Intravenous Q T,Th,Sa-HD  . darbepoetin (ARANESP) injection - DIALYSIS  100 mcg Intravenous Q Tue-HD  . darunavir  800 mg Oral Q breakfast  . famotidine  20 mg Oral Q24H  . ferric glucontate (NULECIT) IV  125 mg Intravenous Q Thu-HD  . heparin  5,000 Units Subcutaneous Q8H  . lamiVUDine  25 mg Oral Daily  . multivitamin  1 tablet Oral QHS  . ritonavir  100 mg Oral Q breakfast  . stavudine  20 mg Oral Daily  . sulfamethoxazole-trimethoprim  1 tablet Oral Q M,W,F   Ht: 5\' 3"  (160 cm)  Wt: 136 lb 7.4 oz (61.9 kg)  Ideal Wt: 52.3 kg % Ideal Wt: 118%  Usual Wt: 76.1 kg (12/17/09 office visit) % Usual Wt: 81%  Body mass index is 24.17 kg/(m^2). Wt is WNL  Food/Nutrition Related Hx: pt with chronic poor PO intake  Labs:  CMP     Component Value Date/Time   NA 132* 12/26/2010 1243   K 3.8 12/26/2010 1243   CL 100 12/26/2010 1243   CO2 28 12/26/2010 1243   GLUCOSE 73 12/26/2010 1243   BUN 24* 12/26/2010 1243   CREATININE 5.15* 12/26/2010 1243   CALCIUM 7.5* 12/26/2010 1243   CALCIUM 7.6* 09/14/2007 1321   PROT 8.2 12/25/2010 0500   ALBUMIN 1.7* 12/26/2010 1243   AST 36 12/25/2010 0500   ALT 9 12/25/2010 0500   ALKPHOS 129* 12/25/2010 0500   BILITOT 1.4* 12/25/2010 0500   GFRNONAA 9* 12/26/2010 1243   GFRAA 10* 12/26/2010 1243  Phosphorus 2.7  Intake/Output: I/O last 3 completed shifts: In: 520 [P.O.:220; I.V.:200; IV Piggyback:100] Out: 2000 [Other:2000]    Diet Order: Renal 60/70  Supplements/Tube Feeding: Nepro prn  IVF:    Estimated Nutritional Needs:   Kcal:  2000 - 2200 Protein:  90 - 105g Fluid:  1.2 L/d  Stage III pressure ulcer. RD consulted for poor PO intake. Pt reports this has been a chronic issue for her. Eats regular diet PTA, does not follow renal diet. Agreeable to supplements. Pt meets criteria for moderate malnutrition in the context of chronic illness.  NUTRITION DIAGNOSIS: -Malnutrition (NI-5.2).  Status: Ongoing  RELATED TO: poor PO intake  AS EVIDENCE BY: pt report and unintentional wt loss of ~20% x 1 year.  MONITORING/EVALUATION(Goals): Goal: Pt to consume >75% of meals and supplements. Unmet. Monitor: weights, labs, PO intake, I/O's  EDUCATION NEEDS: -No education needs identified at this time  INTERVENTION: 1. Discussed importance of protein intake and weight maintenance.  2. 30 ml Prostat BID 3. Resource Breeze PO BID 4. Consider liberalizing diet  to Regular to promote additional PO intake 5. RD to follow nutrition care plan  Dietitian #: [phone]  [place] Per approved criteria  -Non-severe (moderate) malnutrition in the context of chronic illness    Asencion Partridge 12/27/2010, 9:46 AM

## 2010-12-28 DIAGNOSIS — R6520 Severe sepsis without septic shock: Secondary | ICD-10-CM

## 2010-12-28 DIAGNOSIS — A419 Sepsis, unspecified organism: Secondary | ICD-10-CM

## 2010-12-28 DIAGNOSIS — R6521 Severe sepsis with septic shock: Secondary | ICD-10-CM

## 2010-12-28 DIAGNOSIS — Z6838 Body mass index (BMI) 38.0-38.9, adult: Secondary | ICD-10-CM

## 2010-12-28 LAB — RENAL FUNCTION PANEL
Albumin: 1.8 g/dL — ABNORMAL LOW (ref 3.5–5.2)
BUN: 15 mg/dL (ref 6–23)
CO2: 27 mEq/L (ref 19–32)
Calcium: 8.1 mg/dL — ABNORMAL LOW (ref 8.4–10.5)
Chloride: 102 mEq/L (ref 96–112)
Creatinine, Ser: 4.82 mg/dL — ABNORMAL HIGH (ref 0.50–1.10)
GFR calc Af Amer: 11 mL/min — ABNORMAL LOW (ref >90)
GFR calc non Af Amer: 9 mL/min — ABNORMAL LOW (ref >90)
Glucose, Bld: 93 mg/dL (ref 70–99)
Phosphorus: 2 mg/dL — ABNORMAL LOW (ref 2.3–4.6)
Potassium: 3.3 mEq/L — ABNORMAL LOW (ref 3.5–5.1)
Sodium: 136 mEq/L (ref 135–145)

## 2010-12-28 LAB — CBC
HCT: 28.9 % — ABNORMAL LOW (ref 36.0–46.0)
Hemoglobin: 9 g/dL — ABNORMAL LOW (ref 12.0–15.0)
MCH: 31.9 pg (ref 26.0–34.0)
MCHC: 31.1 g/dL (ref 30.0–36.0)
MCV: 102.5 fL — ABNORMAL HIGH (ref 78.0–100.0)
Platelets: 193 10*3/uL (ref 150–400)
RBC: 2.82 MIL/uL — ABNORMAL LOW (ref 3.87–5.11)
RDW: 15.2 % (ref 11.5–15.5)
WBC: 2.7 10*3/uL — ABNORMAL LOW (ref 4.0–10.5)

## 2010-12-28 MED ORDER — CEFAZOLIN SODIUM-DEXTROSE 2-3 GM-% IV SOLR: 2.0000 g | INTRAVENOUS | Status: DC

## 2010-12-28 MED ORDER — CEFAZOLIN SODIUM 1-5 GM-% IV SOLN
1.0000 g | Freq: Once | INTRAVENOUS | Status: AC
  Administered 2010-12-30: 1 g via INTRAVENOUS
  Filled 2010-12-28: qty 50

## 2010-12-28 MED ORDER — POVIDONE-IODINE 10 % EX SWAB: Freq: Once | CUTANEOUS | Status: DC

## 2010-12-28 NOTE — Progress Notes (Signed)
Subjective: Pt feeling well today.  No acute events overnight.  Objective: Vital signs in last 24 hours: Filed Vitals:   12/27/10 1500 12/27/10 1900 12/27/10 2156 12/28/10 0606  BP: 125/72 138/76 140/80 166/97  Pulse: 77 78 82 68  Temp: 98.8 F (37.1 C) 98 F (36.7 C) 98.9 F (37.2 C) 98.1 F (36.7 C)  TempSrc: Oral Oral Oral Oral  Resp: 17 16 19 18   Height:      Weight:      SpO2: 95% 94% 95% 94%   Weight change:   Intake/Output Summary (Last 24 hours) at 12/28/10 0847 Last data filed at 12/27/10 1900  Gross per 24 hour  Intake    480 ml  Output      0 ml  Net    480 ml    Physical Exam: VItal signs reviewed and stable. GEN: No apparent distress.  Alert and oriented x 3.  Pleasant, conversant, and cooperative to exam. HEENT: head is autraumatic and normocephalic.  Neck is supple without palpable masses or lymphadenopathy.  No JVD or carotid bruits.  Vision intact.  EOMI.  PERRLA.  Sclerae anicteric.   RESP:  Lungs are clear to ascultation bilaterally with good air movement.  No wheezes, ronchi, or rubs. CARDIOVASCULAR: regular rate, normal rhythm.  Clear S1, S2, no murmurs, gallops, or rubs. ABDOMEN: soft, non-tender, non-distended.  Bowels sounds present in all quadrants and normoactive.  No palpable masses. EXT: warm and dry.   No clubbing or cyanosis.  No edema in bilateral lower extremities. SKIN: warm and dry with normal turgor.  No rashes or abnormal lesions observed.  Lab Results: Basic Metabolic Panel:  Lab 0000000 0500 12/26/10 1243  NA 136 132*  K 3.3* 3.8  CL 102 100  CO2 27 28  GLUCOSE 93 73  BUN 15 24*  CREATININE 4.82* 5.15*  CALCIUM 8.1* 7.5*  MG -- --  PHOS 2.0* 2.7   CBC:  Lab 12/28/10 0500 12/26/10 1242 12/23/10 2021  WBC 2.7* 3.3* --  NEUTROABS -- -- 7.9*  HGB 9.0* 8.3* --  HCT 28.9* 25.7* --  MCV 102.5* 101.6* --  PLT 193 174 --     Micro Results: Recent Results (from the past 240 hour(s))  CULTURE, BLOOD (ROUTINE X 2)      Status: Normal   Collection Time   12/23/10  8:10 PM      Component Value Range Status Comment   Specimen Description BLOOD ARM LEFT   Final    Special Requests BOTTLES DRAWN AEROBIC AND ANAEROBIC 10CC   Final    Setup Time [id number]   Final    Culture     Final    Value: STAPHYLOCOCCUS AUREUS     Note: RIFAMPIN AND GENTAMICIN SHOULD NOT BE USED AS SINGLE DRUGS FOR TREATMENT OF STAPH INFECTIONS.     Note: Gram Stain Report Called to,Read Back By and Verified With: [facility] @ [id number] 12/24/10 WICKN   Report Status 12/26/2010 FINAL   Final    Organism ID, Bacteria STAPHYLOCOCCUS AUREUS   Final   CULTURE, BLOOD (ROUTINE X 2)     Status: Normal   Collection Time   12/23/10  8:20 PM      Component Value Range Status Comment   Specimen Description BLOOD ARM RIGHT   Final    Special Requests BOTTLES DRAWN AEROBIC AND ANAEROBIC 10CC   Final    Setup Time [id number]   Final    Culture  Final    Value: STAPHYLOCOCCUS AUREUS     Note: SUSCEPTIBILITIES PERFORMED ON PREVIOUS CULTURE WITHIN THE LAST 5 DAYS.     Note: Gram Stain Report Called to,Read Back By and Verified With: [facility] @ [id number] 12/24/10 WICKN   Report Status 12/26/2010 FINAL   Final   MRSA PCR SCREENING     Status: Normal   Collection Time   12/24/10  1:18 AM      Component Value Range Status Comment   MRSA by PCR NEGATIVE  NEGATIVE  Final   CATH TIP CULTURE     Status: Normal (Preliminary result)   Collection Time   12/27/10  8:36 AM      Component Value Range Status Comment   Specimen Description CATH TIP   Final    Special Requests LEFT CHEST   Final    Culture NO GROWTH 1 DAY   Final    Report Status PENDING   Incomplete     Medications: I have reviewed the patient's current medications.  Assessment/Plan: MSSA Bacteremia: septic shock as a result of MSSA bacteremia is resolved; pt remains afebrile and hemodynamically stable.  Infected HD cath is the likely source of her infection. She is tolerating Ancef  well and will need to continue IV abx for 6 weeks starting from the date of the first negative blood culture.  Blood cultures were drawn yesterday following removal of HD cath, will obtain another set today and tomorrow.  Placement of new HD access planned for Monday. - Continue Ancef for 6 weeks (start date = date of first neg blood cx) - Repeat blood cultures today and tomorrow - F/U results of blood cx drawn yesterday - Consider TEE if bacteremia persists to eval for endocarditis - Appreciate renal input and assistance with Ms. Demoret  #HIV: continue ART  #AOCD: Hbg remains stable and at baseline.  #HTN: stable  #ESRD: HD currently on hold until Monday 12/30/10 following placement of HD access.   - Next HD planned for Monday 12/24 - Will continue to f/u renal recommendations  Dispo: possible d/c next week following placement of HD access.  Pt will need to continue IV abx in a SNF setting or at home with [facility] services. [name] 12/28/2010, 8:47 AM

## 2010-12-28 NOTE — Progress Notes (Signed)
Subjective: No current complaints.  Fevers resolved. Feels much better.  Objective: Vital signs in last 24 hours: Temp:  [98 F (36.7 C)-99.1 F (37.3 C)] 98.1 F (36.7 C) (12/22 0606) Pulse Rate:  [68-86] 68  (12/22 0606) Resp:  [16-19] 18  (12/22 0606) BP: (115-166)/(69-97) 166/97 mmHg (12/22 0606) SpO2:  [93 %-95 %] 94 % (12/22 0606) Weight change:   Intake/Output from previous day: 12/21 0701 - 12/22 0700 In: 480 [P.O.:480] Out: -    EXAM: General appearance:  Alert, in no apparent distress  Resp:  CTA bilaterally Cardio:  RRR without murmur GI:  + BS, soft & nontender  Extremities:  No pitting edema.  Chronic brawny skin changes with discoloration of LLE below the knee    Access:  None, new CVC on Monday   Lab Results:  Basename 12/28/10 0500 12/26/10 1242  WBC 2.7* 3.3*  HGB 9.0* 8.3*  HCT 28.9* 25.7*  PLT 193 174   BMET:  Basename 12/28/10 0500 12/26/10 1243  NA 136 132*  K 3.3* 3.8  CL 102 100  CO2 27 28  GLUCOSE 93 73  BUN 15 24*  CREATININE 4.82* 5.15*  CALCIUM 8.1* 7.5*  ALBUMIN 1.8* 1.7*   No results found for this basename: PTH:2 in the last 72 hours Iron Studies: No results found for this basename: IRON,TIBC,TRANSFERRIN,FERRITIN in the last 72 hours  Assessment/Plan: 1.  MSSA bacteremia - on Ancef, no further hypotension, afebrile, WBCs 3.3, Left IJ catheter removed yesterday with plan to hold HD until a new catheter is placed on Monday, 12/24. 2.  ESRD - on HD on TTS, last HD on 12/20, holding until 12/24 while catheter is out.  Stable with K of 3.3, main issue is fluid gain. 3.  Anemia - on Aranesp on Tues, on Fe on Thurs, Hgb 9. 4.  Secondary Hyperparathyroidism - Off binders. 5.  Nutrition - Alb 1.8, chronic. 6.  HIV - followed by ID.      LOS: 5 days   LYLES,CHARLES 12/28/2010,7:37 AM  Patient seen and examined and agree with A/P as above.   Kelly Splinter, MD [employer] [phone] pager   [phone] cell 12/28/2010,  5:36 PM

## 2010-12-28 NOTE — Progress Notes (Signed)
ANTIBIOTIC CONSULT NOTE - FOLLOW UP  Pharmacy Consult for Cefazolin Indication: MSSA bacteremia  Assessment:  Anticoagulation: N/A Infectious Diseases:  Ancef D#5/42 for MSSA bacteremia, left IJ cath removed, repeat bld cx 12/27/10 NGTD, WBC 2.7, afebrile.  Pt got Ancef today although didn't go to HD.  HIV regimen changed d/t adherence issues, renal dosing of ARV appropriate. Bactrim for PCP prophylaxis.  Considering TEE Pulmonary: 97% RA Cardiovascular: Hypotension improved, BP 116/81, HR 87 off pressors Gastrointestinal: PO famotidine Nephrology: ESRD-HD TTS, K+ 3.3, Phos 2 Hematology/Oncology: anemia of CKD, H/H stable, Aranesp, IV iron Best Practices: heparin sq q8, PO famotidine, home meds addressed  Plan: - Continue IV cefazolin 2gm qHD x 6weeks, adjust abx to coincide with holiday schedule - Follow-up repeat BCx   Johnnette Gourd, PharmD 12/28/2010    No Active Allergies  Patient Measurements: Height: 5\' 3"  (160 cm) Weight: 136 lb 7.4 oz (61.9 kg) IBW/kg (Calculated) : 52.4   Vital Signs: Temp: 98.6 F (37 C) (12/22 1300) Temp src: Oral (12/22 1300) BP: 116/81 mmHg (12/22 1300) Pulse Rate: 87  (12/22 1300) Intake/Output from previous day: 12/21 0701 - 12/22 0700 In: 480 [P.O.:480] Out: -  Intake/Output from this shift: Total I/O In: 480 [P.O.:480] Out: -   Labs:  Basename 12/28/10 0500 12/26/10 1243 12/26/10 1242  WBC 2.7* -- 3.3*  HGB 9.0* -- 8.3*  PLT 193 -- 174  LABCREA -- -- --  CREATININE 4.82* 5.15* --   Estimated Creatinine Clearance: 11 ml/min (by C-G formula based on Cr of 4.82). No results found for this basename: VANCOTROUGH:2,VANCOPEAK:2,VANCORANDOM:2,GENTTROUGH:2,GENTPEAK:2,GENTRANDOM:2,TOBRATROUGH:2,TOBRAPEAK:2,TOBRARND:2,AMIKACINPEAK:2,AMIKACINTROU:2,AMIKACIN:2, in the last 72 hours   Microbiology: Recent Results (from the past 720 hour(s))  CULTURE, BLOOD (ROUTINE X 2)     Status: Normal   Collection Time   12/23/10  8:10 PM   Component Value Range Status Comment   Specimen Description BLOOD ARM LEFT   Final    Special Requests BOTTLES DRAWN AEROBIC AND ANAEROBIC 10CC   Final    Setup Time [id number]   Final    Culture     Final    Value: STAPHYLOCOCCUS AUREUS     Note: RIFAMPIN AND GENTAMICIN SHOULD NOT BE USED AS SINGLE DRUGS FOR TREATMENT OF STAPH INFECTIONS.     Note: Gram Stain Report Called to,Read Back By and Verified With: [facility] @ 1215 12/24/10 WICKN   Report Status 12/26/2010 FINAL   Final    Organism ID, Bacteria STAPHYLOCOCCUS AUREUS   Final   CULTURE, BLOOD (ROUTINE X 2)     Status: Normal   Collection Time   12/23/10  8:20 PM      Component Value Range Status Comment   Specimen Description BLOOD ARM RIGHT   Final    Special Requests BOTTLES DRAWN AEROBIC AND ANAEROBIC 10CC   Final    Setup Time [id number]   Final    Culture     Final    Value: STAPHYLOCOCCUS AUREUS     Note: SUSCEPTIBILITIES PERFORMED ON PREVIOUS CULTURE WITHIN THE LAST 5 DAYS.     Note: Gram Stain Report Called to,Read Back By and Verified With: [facility] @ [id number] 12/24/10 WICKN   Report Status 12/26/2010 FINAL   Final   MRSA PCR SCREENING     Status: Normal   Collection Time   12/24/10  1:18 AM      Component Value Range Status Comment   MRSA by PCR NEGATIVE  NEGATIVE  Final   CATH TIP CULTURE  Status: Normal (Preliminary result)   Collection Time   12/27/10  8:36 AM      Component Value Range Status Comment   Specimen Description CATH TIP   Final    Special Requests LEFT CHEST   Final    Culture NO GROWTH 1 DAY   Final    Report Status PENDING   Incomplete   CULTURE, BLOOD (ROUTINE X 2)     Status: Normal (Preliminary result)   Collection Time   12/27/10 11:20 AM      Component Value Range Status Comment   Specimen Description BLOOD HAND RIGHT   Final    Special Requests BOTTLES DRAWN AEROBIC ONLY 5CC   Final    Setup Time [id number]   Final    Culture     Final    Value:        BLOOD CULTURE  RECEIVED NO GROWTH TO DATE CULTURE WILL BE HELD FOR 5 DAYS BEFORE ISSUING A FINAL NEGATIVE REPORT   Report Status PENDING   Incomplete   CULTURE, BLOOD (ROUTINE X 2)     Status: Normal (Preliminary result)   Collection Time   12/27/10 11:35 AM      Component Value Range Status Comment   Specimen Description BLOOD HAND LEFT   Final    Special Requests BOTTLES DRAWN AEROBIC ONLY 6CC   Final    Setup Time [id number]   Final    Culture     Final    Value:        BLOOD CULTURE RECEIVED NO GROWTH TO DATE CULTURE WILL BE HELD FOR 5 DAYS BEFORE ISSUING A FINAL NEGATIVE REPORT   Report Status PENDING   Incomplete     Anti-infectives     Start     Dose/Rate Route Frequency Ordered Stop   12/26/10 1200   ceFAZolin (ANCEF) IVPB 2 g/50 mL premix        2 g 100 mL/hr over 30 Minutes Intravenous Every T-Th-Sa (Hemodialysis) 12/24/10 1629     12/26/10 0915   darunavir (PREZISTA) tablet 800 mg        800 mg Oral Daily with breakfast 12/26/10 0905     12/25/10 1000  sulfamethoxazole-trimethoprim (BACTRIM DS) 800-160 MG per tablet 1 tablet       1 tablet Oral Every M-W-F 12/24/10 0121     12/24/10 1800   vancomycin (VANCOCIN) 750 mg in sodium chloride 0.9 % 150 mL IVPB  Status:  Discontinued        750 mg 150 mL/hr over 60 Minutes Intravenous Every T-Th-Sa (Hemodialysis) 12/24/10 1253 12/26/10 0847   12/24/10 1800   ceFAZolin (ANCEF) IVPB 2 g/50 mL premix  Status:  Discontinued        2 g 100 mL/hr over 30 Minutes Intravenous  Once 12/24/10 1625 12/26/10 0903   12/24/10 1545   darunavir (PREZISTA) tablet 800 mg  Status:  Discontinued        800 mg Oral Daily 12/24/10 1539 12/26/10 0903   12/24/10 1330   cefTRIAXone (ROCEPHIN) 1 g in dextrose 5 % 50 mL IVPB  Status:  Discontinued        1 g 100 mL/hr over 30 Minutes Intravenous Every 24 hours 12/24/10 1219 12/25/10 0936   12/24/10 1000   lamiVUDine (EPIVIR) 10 MG/ML solution 25 mg        25 mg Oral Daily 12/24/10 0121     12/24/10 1000    stavudine (ZERIT) capsule 20 mg  20 mg Oral Daily 12/24/10 0121     12/24/10 1000   oseltamivir (TAMIFLU) capsule 75 mg  Status:  Discontinued        75 mg Oral Daily 12/24/10 0121 12/24/10 1048   12/24/10 0800   atazanavir (REYATAZ) capsule 300 mg  Status:  Discontinued        300 mg Oral Daily with breakfast 12/24/10 0121 12/24/10 1539   12/24/10 0800   ritonavir (NORVIR) tablet 100 mg        100 mg Oral Daily with breakfast 12/24/10 0121     12/24/10 0600   piperacillin-tazobactam (ZOSYN) IVPB 2.25 g  Status:  Discontinued        2.25 g 100 mL/hr over 30 Minutes Intravenous 3 times per day 12/24/10 0144 12/24/10 1219   12/24/10 0230   vancomycin (VANCOCIN) 500 mg in sodium chloride 0.9 % 100 mL IVPB        500 mg 100 mL/hr over 60 Minutes Intravenous  Once 12/24/10 0144 12/24/10 0357   12/24/10 0230   ciprofloxacin (CIPRO) IVPB 400 mg  Status:  Discontinued        400 mg 200 mL/hr over 60 Minutes Intravenous Every 24 hours 12/24/10 0144 12/24/10 1219   12/24/10 0130   vancomycin (VANCOCIN) IVPB 1000 mg/200 mL premix  Status:  Discontinued        1,000 mg 200 mL/hr over 60 Minutes Intravenous  Once 12/24/10 0120 12/24/10 0138   12/23/10 2045   vancomycin (VANCOCIN) IVPB 1000 mg/200 mL premix        1,000 mg 200 mL/hr over 60 Minutes Intravenous  Once 12/23/10 2034 12/23/10 2246   12/23/10 2045   piperacillin-tazobactam (ZOSYN) IVPB 3.375 g        3.375 g 12.5 mL/hr over 240 Minutes Intravenous  Once 12/23/10 2034 12/24/10 0145

## 2010-12-29 LAB — CATH TIP CULTURE: Culture: NO GROWTH

## 2010-12-29 LAB — CBC
HCT: 28.4 % — ABNORMAL LOW (ref 36.0–46.0)
Hemoglobin: 8.8 g/dL — ABNORMAL LOW (ref 12.0–15.0)
MCH: 31.7 pg (ref 26.0–34.0)
MCHC: 31 g/dL (ref 30.0–36.0)
MCV: 102.2 fL — ABNORMAL HIGH (ref 78.0–100.0)
Platelets: 193 10*3/uL (ref 150–400)
RBC: 2.78 MIL/uL — ABNORMAL LOW (ref 3.87–5.11)
RDW: 15.4 % (ref 11.5–15.5)
WBC: 2.7 10*3/uL — ABNORMAL LOW (ref 4.0–10.5)

## 2010-12-29 LAB — RENAL FUNCTION PANEL
Albumin: 1.8 g/dL — ABNORMAL LOW (ref 3.5–5.2)
BUN: 19 mg/dL (ref 6–23)
CO2: 26 mEq/L (ref 19–32)
Calcium: 8.5 mg/dL (ref 8.4–10.5)
Chloride: 103 mEq/L (ref 96–112)
Creatinine, Ser: 6.37 mg/dL — ABNORMAL HIGH (ref 0.50–1.10)
GFR calc Af Amer: 8 mL/min — ABNORMAL LOW (ref >90)
GFR calc non Af Amer: 7 mL/min — ABNORMAL LOW (ref >90)
Glucose, Bld: 73 mg/dL (ref 70–99)
Phosphorus: 2.3 mg/dL (ref 2.3–4.6)
Potassium: 3.6 mEq/L (ref 3.5–5.1)
Sodium: 137 mEq/L (ref 135–145)

## 2010-12-29 NOTE — Progress Notes (Signed)
Subjective: Pt feeling well today.  No acute events overnight. Denies any pain. No chest pain/fevers/chills/SOB.  Objective: Vital signs in last 24 hours: Filed Vitals:   12/28/10 1300 12/28/10 1700 12/28/10 2108 12/29/10 0553  BP: 116/81 131/73 121/68 146/81  Pulse: 87 75 72 63  Temp: 98.6 F (37 C) 97.8 F (36.6 C) 98 F (36.7 C) 98.2 F (36.8 C)  TempSrc: Oral Oral Oral Oral  Resp: 18 19 18 20   Height:      Weight:   136 lb 11 oz (62 kg)   SpO2: 97% 98% 97% 96%   Weight change:   Intake/Output Summary (Last 24 hours) at 12/29/10 0809 Last data filed at 12/28/10 1300  Gross per 24 hour  Intake    480 ml  Output      0 ml  Net    480 ml    Physical Exam: VItal signs reviewed and stable. GEN: No apparent distress.  Alert and oriented x 3.  Pleasant, conversant, and cooperative to exam. HEENT: head is autraumatic and normocephalic.  Neck is supple without palpable masses or lymphadenopathy.  No JVD or carotid bruits.  Vision intact.  EOMI.  PERRLA.  Sclerae anicteric.   RESP:  Lungs are clear to ascultation bilaterally with good air movement.  No wheezes, ronchi, or rubs. CARDIOVASCULAR: regular rate, normal rhythm.  Clear S1, S2, no murmurs, gallops, or rubs. ABDOMEN: soft, non-tender, non-distended.  Bowels sounds present in all quadrants and normoactive.  No palpable masses. EXT: warm and dry.   No clubbing or cyanosis.  No edema in bilateral lower extremities. SKIN: warm and dry with normal turgor.  No rashes or abnormal lesions observed.  Lab Results: Basic Metabolic Panel:  Lab [id number] 12/28/10 0500  NA 137 136  K 3.6 3.3*  CL 103 102  CO2 26 27  GLUCOSE 73 93  BUN 19 15  CREATININE 6.37* 4.82*  CALCIUM 8.5 8.1*  MG -- --  PHOS 2.3 2.0*   CBC:  Lab 12/29/10 0552 12/28/10 0500 12/23/10 2021  WBC 2.7* 2.7* --  NEUTROABS -- -- 7.9*  HGB 8.8* 9.0* --  HCT 28.4* 28.9* --  MCV 102.2* 102.5* --  PLT 193 193 --     Micro Results: Recent Results  (from the past 240 hour(s))  CULTURE, BLOOD (ROUTINE X 2)     Status: Normal   Collection Time   12/23/10  8:10 PM      Component Value Range Status Comment   Specimen Description BLOOD ARM LEFT   Final    Special Requests BOTTLES DRAWN AEROBIC AND ANAEROBIC 10CC   Final    Setup Time [id number]   Final    Culture     Final    Value: STAPHYLOCOCCUS AUREUS     Note: RIFAMPIN AND GENTAMICIN SHOULD NOT BE USED AS SINGLE DRUGS FOR TREATMENT OF STAPH INFECTIONS.     Note: Gram Stain Report Called to,Read Back By and Verified With: [facility] @ [id number] 12/24/10 WICKN   Report Status 12/26/2010 FINAL   Final    Organism ID, Bacteria STAPHYLOCOCCUS AUREUS   Final   CULTURE, BLOOD (ROUTINE X 2)     Status: Normal   Collection Time   12/23/10  8:20 PM      Component Value Range Status Comment   Specimen Description BLOOD ARM RIGHT   Final    Special Requests BOTTLES DRAWN AEROBIC AND ANAEROBIC 10CC   Final    Setup Time [id number]  Final    Culture     Final    Value: STAPHYLOCOCCUS AUREUS     Note: SUSCEPTIBILITIES PERFORMED ON PREVIOUS CULTURE WITHIN THE LAST 5 DAYS.     Note: Gram Stain Report Called to,Read Back By and Verified With: [facility] @ 1215 12/24/10 WICKN   Report Status 12/26/2010 FINAL   Final   MRSA PCR SCREENING     Status: Normal   Collection Time   12/24/10  1:18 AM      Component Value Range Status Comment   MRSA by PCR NEGATIVE  NEGATIVE  Final   CATH TIP CULTURE     Status: Normal   Collection Time   12/27/10  8:36 AM      Component Value Range Status Comment   Specimen Description CATH TIP   Final    Special Requests LEFT CHEST   Final    Culture NO GROWTH 2 DAYS   Final    Report Status 12/29/2010 FINAL   Final   CULTURE, BLOOD (ROUTINE X 2)     Status: Normal (Preliminary result)   Collection Time   12/27/10 11:20 AM      Component Value Range Status Comment   Specimen Description BLOOD HAND RIGHT   Final    Special Requests BOTTLES DRAWN AEROBIC  ONLY 5CC   Final    Setup Time [id number]   Final    Culture     Final    Value:        BLOOD CULTURE RECEIVED NO GROWTH TO DATE CULTURE WILL BE HELD FOR 5 DAYS BEFORE ISSUING A FINAL NEGATIVE REPORT   Report Status PENDING   Incomplete   CULTURE, BLOOD (ROUTINE X 2)     Status: Normal (Preliminary result)   Collection Time   12/27/10 11:35 AM      Component Value Range Status Comment   Specimen Description BLOOD HAND LEFT   Final    Special Requests BOTTLES DRAWN AEROBIC ONLY 6CC   Final    Setup Time [id number]   Final    Culture     Final    Value:        BLOOD CULTURE RECEIVED NO GROWTH TO DATE CULTURE WILL BE HELD FOR 5 DAYS BEFORE ISSUING A FINAL NEGATIVE REPORT   Report Status PENDING   Incomplete     Medications: I have reviewed the patient's current medications.  Assessment/Plan: MSSA Bacteremia: septic shock as a result of MSSA bacteremia is resolved; pt remains afebrile and hemodynamically stable.  Infected HD cath is the likely source of her infection. She is tolerating Ancef well and will need to continue IV abx for 6 weeks starting from the date of the first negative blood culture.  Blood cultures were drawn yesterday following removal of HD cath, will obtain another set today and tomorrow.  Placement of new HD access planned for Monday. - Continue Ancef for 6 weeks (start date = date of first neg blood cx) with HD - Repeat blood cultures today  - F/U results of blood cx drawn (12/21 shows no growth to date, 12/22 shows no growth to date) - Consider TEE if bacteremia persists to eval for endocarditis - Appreciate renal input and assistance with Ms. Landaverde  #HIV: continue ART  #AOCD: Hbg remains stable and at baseline.  #HTN: stable  #ESRD: HD currently on hold until Monday 12/30/10 following placement of HD access.  Cr. 6.37 today and K 3.6 and Phos 2.3 - Next HD  planned for Monday 12/24 - Will continue to f/u renal recommendations  #Malnutrition - Albumin 1.8,  due to ESRD. Is on feeding supplement.  Dispo: possible d/c next week following placement of HD access.  Pt will need to continue IV abx w/ HD.    Doug Sou, Lennard Capek 12/29/2010, 8:09 AM

## 2010-12-29 NOTE — Progress Notes (Signed)
Subjective: no complaints, no dyspnea.  Objective: Vital signs in last 24 hours: Temp:  [97.8 F (36.6 C)-99 F (37.2 C)] 99 F (37.2 C) (12/23 0900) Pulse Rate:  [63-88] 88  (12/23 0900) Resp:  [18-20] 19  (12/23 0900) BP: (116-146)/(68-81) 128/81 mmHg (12/23 0900) SpO2:  [96 %-99 %] 99 % (12/23 0900) Weight:  [62 kg (136 lb 11 oz)] 136 lb 11 oz (62 kg) (12/22 2108) Weight change:   Intake/Output from previous day: 12/22 0701 - 12/23 0700 In: 480 [P.O.:480] Out: -  Intake/Output this shift: Total I/O In: 360 [P.O.:360] Out: -  EXAM: General appearance:  Alert, in no apparent distress Resp:  CTA B Cardio:  RRR without murmur GI: + BS, soft and nontender Extremities: No edema; chronic brawny skin changes with discoloration of left lower leg Access:  New CVC on Monday  Lab Results:  Basename 12/29/10 0552 12/28/10 0500  WBC 2.7* 2.7*  HGB 8.8* 9.0*  HCT 28.4* 28.9*  PLT 193 193   BMET:  Basename 12/29/10 0552 12/28/10 0500  NA 137 136  K 3.6 3.3*  CL 103 102  CO2 26 27  GLUCOSE 73 93  BUN 19 15  CREATININE 6.37* 4.82*  CALCIUM 8.5 8.1*  ALBUMIN 1.8* 1.8*   No results found for this basename: PTH:2 in the last 72 hours Iron Studies: No results found for this basename: IRON,TIBC,TRANSFERRIN,FERRITIN in the last 72 hours  Dialysis Orders: Center: [place] on TTS since 2009.  EDW 61.5kg (on 12/19/10) HD Bath 2K/2.50 Ca Time 4 hrs Heparin 5,900 units. Access LIJ PC BFR 400 DFR A 1.5  Zemplar none mcg IV/HD Epogen 10,000 Units IV/HD Venofer 100mg  IV qwk  Assessment/Plan: 1.  MSSA bacteremia - on Ancef, afebrile, WBCs of 2.7, left IJ catheter removed on 12/21, new catheter to be placed by Dr. Scot Dock tomorrow.  2.  ESRD - on HD on TTS, held since last treatment on 12/20, K stable @ 3.6, no signs or symptoms of fluid overload, next HD tomorrow after placement of new catheter. Just over EDW by 1-2 kg.   3.  Anemia - on Aranesp on Tues, Fe on Thurs, Hgb 8.8.  Aranesp with  HD tomorrow. 4.  Secondary hyperparathyroidism - Off binders. 5.  Nutrition - Alb 1.8, chronic. 6.  HIV - followed by ID.   LOS: 6 days   LYLES,CHARLES 12/29/2010,9:21 AM  Patient seen and examined and agree with assessment and plan as above.   Kelly Splinter, MD [employer] [phone] cell 12/29/2010, 6:07 PM

## 2010-12-29 NOTE — Progress Notes (Signed)
VASCULAR PROGRESS NOTE  SUBJECTIVE: no complaints  PHYSICAL EXAM: Filed Vitals:   12/28/10 1700 12/28/10 2108 12/29/10 0553 12/29/10 0900  BP: 131/73 121/68 146/81 128/81  Pulse: 75 72 63 88  Temp: 97.8 F (36.6 C) 98 F (36.7 C) 98.2 F (36.8 C) 99 F (37.2 C)  TempSrc: Oral Oral Oral Oral  Resp: 19 18 20 19   Height:      Weight:  136 lb 11 oz (62 kg)    SpO2: 98% 97% 96% 99%   Lungs: clear to auscultation  LABS: Lab Results  Component Value Date   WBC 2.7* 12/29/2010   HGB 8.8* 12/29/2010   HCT 28.4* 12/29/2010   MCV 102.2* 12/29/2010   PLT 193 12/29/2010   Lab Results  Component Value Date   CREATININE 6.37* 12/29/2010   Lab Results  Component Value Date   INR 1.32 12/24/2010   ASSESSMENT/PLAN: 1. The patient is scheduled for placement of a tunneled dialysis catheter tomorrow. She is afebrile. I have discussed the procedure with her. All of her questions were answered.  [name], MD, FACS Beeper: [phone] 12/29/2010

## 2010-12-30 ENCOUNTER — Inpatient Hospital Stay (HOSPITAL_COMMUNITY): Payer: Medicaid Other

## 2010-12-30 ENCOUNTER — Encounter (HOSPITAL_COMMUNITY)
Admission: EM | Disposition: A | Discharge status: Home / Self Care | Payer: Self-pay | Source: Home / Self Care | Attending: Pulmonary Disease

## 2010-12-30 ENCOUNTER — Encounter (HOSPITAL_COMMUNITY): Payer: Self-pay | Admitting: Anesthesiology

## 2010-12-30 ENCOUNTER — Inpatient Hospital Stay (HOSPITAL_COMMUNITY): Payer: Medicaid Other | Admitting: Anesthesiology

## 2010-12-30 DIAGNOSIS — T82898A Other specified complication of vascular prosthetic devices, implants and grafts, initial encounter: Secondary | ICD-10-CM

## 2010-12-30 HISTORY — PX: INSERTION OF DIALYSIS CATHETER: SHX1324

## 2010-12-30 LAB — CBC
HCT: 25.9 % — ABNORMAL LOW (ref 36.0–46.0)
Hemoglobin: 8.1 g/dL — ABNORMAL LOW (ref 12.0–15.0)
MCH: 31.9 pg (ref 26.0–34.0)
MCHC: 31.3 g/dL (ref 30.0–36.0)
MCV: 102 fL — ABNORMAL HIGH (ref 78.0–100.0)
Platelets: 192 10*3/uL (ref 150–400)
RBC: 2.54 MIL/uL — ABNORMAL LOW (ref 3.87–5.11)
RDW: 15.6 % — ABNORMAL HIGH (ref 11.5–15.5)
WBC: 3.3 10*3/uL — ABNORMAL LOW (ref 4.0–10.5)

## 2010-12-30 LAB — RENAL FUNCTION PANEL
Albumin: 1.7 g/dL — ABNORMAL LOW (ref 3.5–5.2)
BUN: 26 mg/dL — ABNORMAL HIGH (ref 6–23)
CO2: 26 mEq/L (ref 19–32)
Calcium: 8.2 mg/dL — ABNORMAL LOW (ref 8.4–10.5)
Chloride: 102 mEq/L (ref 96–112)
Creatinine, Ser: 7.8 mg/dL — ABNORMAL HIGH (ref 0.50–1.10)
GFR calc Af Amer: 6 mL/min — ABNORMAL LOW (ref >90)
GFR calc non Af Amer: 5 mL/min — ABNORMAL LOW (ref >90)
Glucose, Bld: 96 mg/dL (ref 70–99)
Phosphorus: 3.2 mg/dL (ref 2.3–4.6)
Potassium: 3.4 mEq/L — ABNORMAL LOW (ref 3.5–5.1)
Sodium: 136 mEq/L (ref 135–145)

## 2010-12-30 SURGERY — INSERTION OF DIALYSIS CATHETER
Anesthesia: Monitor Anesthesia Care | Laterality: Left

## 2010-12-30 MED ORDER — HEPARIN SODIUM (PORCINE) 1000 UNIT/ML IJ SOLN
INTRAMUSCULAR | Status: DC | PRN
  Administered 2010-12-30: 4.6 mL

## 2010-12-30 MED ORDER — PROPOFOL 10 MG/ML IV EMUL
INTRAVENOUS | Status: DC | PRN
  Administered 2010-12-30: 50 ug/kg/min via INTRAVENOUS

## 2010-12-30 MED ORDER — FENTANYL CITRATE 0.05 MG/ML IJ SOLN: 25.0000 ug | INTRAMUSCULAR | Status: DC | PRN

## 2010-12-30 MED ORDER — ACETAMINOPHEN-CODEINE #3 300-30 MG PO TABS
1.0000 | ORAL_TABLET | ORAL | Status: DC | PRN
  Administered 2010-12-30: 1 via ORAL
  Filled 2010-12-30 (×2): qty 1

## 2010-12-30 MED ORDER — FENTANYL CITRATE 0.05 MG/ML IJ SOLN
INTRAMUSCULAR | Status: DC | PRN
  Administered 2010-12-30 (×2): 50 ug via INTRAVENOUS

## 2010-12-30 MED ORDER — ONDANSETRON HCL 4 MG/2ML IJ SOLN
INTRAMUSCULAR | Status: DC | PRN
  Administered 2010-12-30: 4 mg via INTRAVENOUS

## 2010-12-30 MED ORDER — MIDAZOLAM HCL 5 MG/5ML IJ SOLN
INTRAMUSCULAR | Status: DC | PRN
  Administered 2010-12-30: 1 mg via INTRAVENOUS

## 2010-12-30 MED ORDER — SODIUM CHLORIDE 0.9 % IR SOLN
Status: DC | PRN
  Administered 2010-12-30: 1000 mL

## 2010-12-30 MED ORDER — LIDOCAINE HCL (PF) 1 % IJ SOLN
INTRAMUSCULAR | Status: DC | PRN
  Administered 2010-12-30: 5 mL via SUBCUTANEOUS

## 2010-12-30 MED ORDER — SODIUM CHLORIDE 0.9 % IR SOLN
Status: DC | PRN
  Administered 2010-12-30: 09:00:00

## 2010-12-30 MED ORDER — MEPERIDINE HCL 25 MG/ML IJ SOLN: 6.2500 mg | INTRAMUSCULAR | Status: DC | PRN

## 2010-12-30 SURGICAL SUPPLY — 40 items
BAG DECANTER FOR FLEXI CONT (MISCELLANEOUS) ×2 IMPLANT
CATH CANNON HEMO 15F 50CM (CATHETERS) IMPLANT
CATH CANNON HEMO 15FR 19 (HEMODIALYSIS SUPPLIES) IMPLANT
CATH CANNON HEMO 15FR 23CM (HEMODIALYSIS SUPPLIES) ×1 IMPLANT
CATH CANNON HEMO 15FR 31CM (HEMODIALYSIS SUPPLIES) IMPLANT
CATH CANNON HEMO 15FR 32 (HEMODIALYSIS SUPPLIES) IMPLANT
CATH CANNON HEMO 15FR 32CM (HEMODIALYSIS SUPPLIES) IMPLANT
CHLORAPREP W/TINT 26ML (MISCELLANEOUS) ×2 IMPLANT
CLOTH BEACON ORANGE TIMEOUT ST (SAFETY) ×2 IMPLANT
COVER PROBE W GEL 5X96 (DRAPES) IMPLANT
COVER SURGICAL LIGHT HANDLE (MISCELLANEOUS) ×2 IMPLANT
DECANTER SPIKE VIAL GLASS SM (MISCELLANEOUS) ×2 IMPLANT
DRAPE C-ARM 42X72 X-RAY (DRAPES) ×2 IMPLANT
DRAPE CHEST BREAST 15X10 FENES (DRAPES) ×2 IMPLANT
GAUZE SPONGE 2X2 8PLY STRL LF (GAUZE/BANDAGES/DRESSINGS) ×1 IMPLANT
GAUZE SPONGE 4X4 16PLY XRAY LF (GAUZE/BANDAGES/DRESSINGS) ×2 IMPLANT
GLOVE BIO SURGEON STRL SZ7.5 (GLOVE) ×2 IMPLANT
GOWN PREVENTION PLUS XLARGE (GOWN DISPOSABLE) ×2 IMPLANT
GOWN STRL NON-REIN LRG LVL3 (GOWN DISPOSABLE) ×4 IMPLANT
KIT BASIN OR (CUSTOM PROCEDURE TRAY) ×2 IMPLANT
KIT ROOM TURNOVER OR (KITS) ×2 IMPLANT
NDL 18GX1X1/2 (RX/OR ONLY) (NEEDLE) ×1 IMPLANT
NDL HYPO 25GX1X1/2 BEV (NEEDLE) ×1 IMPLANT
NEEDLE 18GX1X1/2 (RX/OR ONLY) (NEEDLE) ×2 IMPLANT
NEEDLE HYPO 25GX1X1/2 BEV (NEEDLE) ×2 IMPLANT
NS IRRIG 1000ML POUR BTL (IV SOLUTION) ×2 IMPLANT
PACK SURGICAL SETUP 50X90 (CUSTOM PROCEDURE TRAY) ×2 IMPLANT
PAD ARMBOARD 7.5X6 YLW CONV (MISCELLANEOUS) ×4 IMPLANT
SPONGE GAUZE 2X2 STER 10/PKG (GAUZE/BANDAGES/DRESSINGS) ×1
SUT ETHILON 3 0 PS 1 (SUTURE) ×2 IMPLANT
SUT VICRYL 4-0 PS2 18IN ABS (SUTURE) ×2 IMPLANT
SYR 20CC LL (SYRINGE) ×4 IMPLANT
SYR 30ML LL (SYRINGE) IMPLANT
SYR 5ML LL (SYRINGE) ×4 IMPLANT
SYR CONTROL 10ML LL (SYRINGE) ×2 IMPLANT
SYRINGE 10CC LL (SYRINGE) ×2 IMPLANT
TAPE CLOTH SURG 4X10 WHT LF (GAUZE/BANDAGES/DRESSINGS) ×1 IMPLANT
TOWEL OR 17X24 6PK STRL BLUE (TOWEL DISPOSABLE) ×2 IMPLANT
TOWEL OR 17X26 10 PK STRL BLUE (TOWEL DISPOSABLE) ×2 IMPLANT
WATER STERILE IRR 1000ML POUR (IV SOLUTION) ×2 IMPLANT

## 2010-12-30 NOTE — Progress Notes (Signed)
ANTIBIOTIC CONSULT NOTE - FOLLOW UP  Pharmacy Consult for Cefazolin Indication: MSSA bacteremia  No Active Allergies  Patient Measurements: Height: 5\' 3"  (160 cm) Weight: 138 lb 0.1 oz (62.6 kg) IBW/kg (Calculated) : 52.4   Vital Signs: Temp: 97.6 F (36.4 C) (12/24 1030) Temp src: Oral (12/24 1030) BP: 146/94 mmHg (12/24 1030) Pulse Rate: 59  (12/24 1030) Intake/Output from previous day: 12/23 0701 - 12/24 0700 In: 1080 [P.O.:1080] Out: -  Intake/Output from this shift: Total I/O In: 100 [I.V.:100] Out: 20 [Blood:20]  Labs:  [facility] 12/29/10 0552 12/28/10 0500  WBC 2.7* 2.7*  HGB 8.8* 9.0*  PLT 193 193  LABCREA -- --  CREATININE 6.37* 4.82*   Estimated Creatinine Clearance: 8.4 ml/min (by C-G formula based on Cr of 6.37).   Microbiology: Recent Results (from the past 720 hour(s))  CULTURE, BLOOD (ROUTINE X 2)     Status: Normal   Collection Time   12/23/10  8:10 PM      Component Value Range Status Comment   Specimen Description BLOOD ARM LEFT   Final    Special Requests BOTTLES DRAWN AEROBIC AND ANAEROBIC 10CC   Final    Setup Time [id number]   Final    Culture     Final    Value: STAPHYLOCOCCUS AUREUS     Note: RIFAMPIN AND GENTAMICIN SHOULD NOT BE USED AS SINGLE DRUGS FOR TREATMENT OF STAPH INFECTIONS.     Note: Gram Stain Report Called to,Read Back By and Verified With: [facility] @ 1215 12/24/10 WICKN   Report Status 12/26/2010 FINAL   Final    Organism ID, Bacteria STAPHYLOCOCCUS AUREUS   Final   CULTURE, BLOOD (ROUTINE X 2)     Status: Normal   Collection Time   12/23/10  8:20 PM      Component Value Range Status Comment   Specimen Description BLOOD ARM RIGHT   Final    Special Requests BOTTLES DRAWN AEROBIC AND ANAEROBIC 10CC   Final    Setup Time [id number]   Final    Culture     Final    Value: STAPHYLOCOCCUS AUREUS     Note: SUSCEPTIBILITIES PERFORMED ON PREVIOUS CULTURE WITHIN THE LAST 5 DAYS.     Note: Gram Stain Report Called  to,Read Back By and Verified With: [facility] @ [id number] 12/24/10 WICKN   Report Status 12/26/2010 FINAL   Final   MRSA PCR SCREENING     Status: Normal   Collection Time   12/24/10  1:18 AM      Component Value Range Status Comment   MRSA by PCR NEGATIVE  NEGATIVE  Final   CATH TIP CULTURE     Status: Normal   Collection Time   12/27/10  8:36 AM      Component Value Range Status Comment   Specimen Description CATH TIP   Final    Special Requests LEFT CHEST   Final    Culture NO GROWTH 2 DAYS   Final    Report Status 12/29/2010 FINAL   Final   CULTURE, BLOOD (ROUTINE X 2)     Status: Normal (Preliminary result)   Collection Time   12/27/10 11:20 AM      Component Value Range Status Comment   Specimen Description BLOOD HAND RIGHT   Final    Special Requests BOTTLES DRAWN AEROBIC ONLY 5CC   Final    Setup Time [id number]   Final    Culture     Final  Value:        BLOOD CULTURE RECEIVED NO GROWTH TO DATE CULTURE WILL BE HELD FOR 5 DAYS BEFORE ISSUING A FINAL NEGATIVE REPORT   Report Status PENDING   Incomplete   CULTURE, BLOOD (ROUTINE X 2)     Status: Normal (Preliminary result)   Collection Time   12/27/10 11:35 AM      Component Value Range Status Comment   Specimen Description BLOOD HAND LEFT   Final    Special Requests BOTTLES DRAWN AEROBIC ONLY [place]   Final    Setup Time [id number]   Final    Culture     Final    Value:        BLOOD CULTURE RECEIVED NO GROWTH TO DATE CULTURE WILL BE HELD FOR 5 DAYS BEFORE ISSUING A FINAL NEGATIVE REPORT   Report Status PENDING   Incomplete   CULTURE, BLOOD (SINGLE)     Status: Normal (Preliminary result)   Collection Time   12/28/10  9:15 AM      Component Value Range Status Comment   Specimen Description BLOOD RIGHT HAND   Final    Special Requests     Final    Value: BOTTLES DRAWN AEROBIC AND ANAEROBIC BLUE 3CC RED [place]   Setup Time [id number]   Final    Culture     Final    Value:        BLOOD CULTURE RECEIVED NO GROWTH TO DATE  CULTURE WILL BE HELD FOR 5 DAYS BEFORE ISSUING A FINAL NEGATIVE REPORT   Report Status PENDING   Incomplete   CULTURE, BLOOD (ROUTINE X 2)     Status: Normal (Preliminary result)   Collection Time   12/29/10  9:50 AM      Component Value Range Status Comment   Specimen Description BLOOD ARM LEFT   Final    Special Requests BOTTLES DRAWN AEROBIC AND ANAEROBIC [place]   Final    Setup Time [id number]   Final    Culture     Final    Value:        BLOOD CULTURE RECEIVED NO GROWTH TO DATE CULTURE WILL BE HELD FOR 5 DAYS BEFORE ISSUING A FINAL NEGATIVE REPORT   Report Status PENDING   Incomplete   CULTURE, BLOOD (ROUTINE X 2)     Status: Normal (Preliminary result)   Collection Time   12/29/10 10:00 AM      Component Value Range Status Comment   Specimen Description BLOOD ARM RIGHT   Final    Special Requests BOTTLES DRAWN AEROBIC AND ANAEROBIC 10.[place]   Final    Setup Time [id number]   Final    Culture     Final    Value:        BLOOD CULTURE RECEIVED NO GROWTH TO DATE CULTURE WILL BE HELD FOR 5 DAYS BEFORE ISSUING A FINAL NEGATIVE REPORT   Report Status PENDING   Incomplete     Anti-infectives     Start     Dose/Rate Route Frequency Ordered Stop   01/02/11 1200   ceFAZolin (ANCEF) IVPB 2 g/50 mL premix        2 g 100 mL/hr over 30 Minutes Intravenous Every T-Th-Sa (Hemodialysis) 12/28/10 1504     12/30/10 1200   ceFAZolin (ANCEF) IVPB 1 g/50 mL premix        1 g 100 mL/hr over 30 Minutes Intravenous  Once 12/28/10 1504     12/26/10 1200  ceFAZolin (ANCEF) IVPB 2 g/50 mL premix  Status:  Discontinued        2 g 100 mL/hr over 30 Minutes Intravenous Every T-Th-Sa (Hemodialysis) 12/24/10 1629 12/28/10 1504   12/26/10 0915   darunavir (PREZISTA) tablet 800 mg        800 mg Oral Daily with breakfast 12/26/10 0905     12/25/10 1000  sulfamethoxazole-trimethoprim (BACTRIM DS) 800-160 MG per tablet 1 tablet       1 tablet Oral Every M-W-F 12/24/10 0121     12/24/10 1800    vancomycin (VANCOCIN) 750 mg in sodium chloride 0.9 % 150 mL IVPB  Status:  Discontinued        750 mg 150 mL/hr over 60 Minutes Intravenous Every T-Th-Sa (Hemodialysis) 12/24/10 1253 12/26/10 0847   12/24/10 1800   ceFAZolin (ANCEF) IVPB 2 g/50 mL premix  Status:  Discontinued        2 g 100 mL/hr over 30 Minutes Intravenous  Once 12/24/10 1625 12/26/10 0903   12/24/10 1545   darunavir (PREZISTA) tablet 800 mg  Status:  Discontinued        800 mg Oral Daily 12/24/10 1539 12/26/10 0903   12/24/10 1330   cefTRIAXone (ROCEPHIN) 1 g in dextrose 5 % 50 mL IVPB  Status:  Discontinued        1 g 100 mL/hr over 30 Minutes Intravenous Every 24 hours 12/24/10 1219 12/25/10 0936   12/24/10 1000   lamiVUDine (EPIVIR) 10 MG/ML solution 25 mg        25 mg Oral Daily 12/24/10 0121     12/24/10 1000   stavudine (ZERIT) capsule 20 mg        20 mg Oral Daily 12/24/10 0121     12/24/10 1000   oseltamivir (TAMIFLU) capsule 75 mg  Status:  Discontinued        75 mg Oral Daily 12/24/10 0121 12/24/10 1048   12/24/10 0800   atazanavir (REYATAZ) capsule 300 mg  Status:  Discontinued        300 mg Oral Daily with breakfast 12/24/10 0121 12/24/10 1539   12/24/10 0800   ritonavir (NORVIR) tablet 100 mg        100 mg Oral Daily with breakfast 12/24/10 0121     12/24/10 0600   piperacillin-tazobactam (ZOSYN) IVPB 2.25 g  Status:  Discontinued        2.25 g 100 mL/hr over 30 Minutes Intravenous 3 times per day 12/24/10 0144 12/24/10 1219   12/24/10 0230   vancomycin (VANCOCIN) 500 mg in sodium chloride 0.9 % 100 mL IVPB        500 mg 100 mL/hr over 60 Minutes Intravenous  Once 12/24/10 0144 12/24/10 0357   12/24/10 0230   ciprofloxacin (CIPRO) IVPB 400 mg  Status:  Discontinued        400 mg 200 mL/hr over 60 Minutes Intravenous Every 24 hours 12/24/10 0144 12/24/10 1219   12/24/10 0130   vancomycin (VANCOCIN) IVPB 1000 mg/200 mL premix  Status:  Discontinued        1,000 mg 200 mL/hr over 60 Minutes  Intravenous  Once 12/24/10 0120 12/24/10 0138   12/23/10 2045   vancomycin (VANCOCIN) IVPB 1000 mg/200 mL premix        1,000 mg 200 mL/hr over 60 Minutes Intravenous  Once 12/23/10 2034 12/23/10 2246   12/23/10 2045   piperacillin-tazobactam (ZOSYN) IVPB 3.375 g        3.375 g 12.5 mL/hr over  240 Minutes Intravenous  Once 12/23/10 2034 12/24/10 0145         Assessment:  Anticoagulation: N/A Infectious Diseases:  Ancef D#3/42 (from 12/22-from date of 1st negative BC)  for MSSA bacteremia, left IJ cath removed, repeat bld cx 12/21, 12/22, 12/23= NGTD, afebrile.  New HD line placed 12/24am.  HIV regimen changed d/t adherence issues, renal dosing of ARV appropriate. Bactrim for PCP prophylaxis.  Considering TEE. Vancomycin was given 12/22 but no HD (was on line holiday).  Pulmonary: 100% 2L Cardiovascular: Hypotension improved.  Gastrointestinal: PO famotidine Nephrology: HD to be done today as new HD catheter placed. ESRD-HD TTS Hematology/Oncology: anemia of CKD, H/H stable, Aranesp, IV iron Best Practices: heparin sq q8, PO famotidine, home meds addressed  Plan: - Continue IV cefazolin 2gm qHD x 6weeks. Give supplemental dose of 1gm today after HD d/t cefazolin 2gm being given on Saturday and was not dialyzed.

## 2010-12-30 NOTE — Progress Notes (Signed)
Patient ID: [name], female   DOB: 1955-06-23, 55 y.o.   MRN: [id number]  S: Had her hemodialysis catheter placed earlier, expresses minimal left upper chest pain. Currently on dialysis that she is tolerating without problems.   O: BP 91/57  Pulse 66  Temp(Src) 97.4 F (36.3 C) (Oral)  Resp 15  Ht 5\' 3"  (1.6 m)  Wt 63.8 kg (140 lb 10.5 oz)  BMI 24.92 kg/m2  SpO2 98%  Gen: Resting comfortably on dialysis, eating a snack.. [id number] RRR, Normal S1 with loud S2 Resp:CTA bilaterally, no rales/rhonchi [id number], flat, non-tender, BS normal Ext:No LE edema.       [name] aspirin EC  81 mg Oral Daily  . B-complex with vitamin C  1 tablet Oral Daily  . ceFAZolin (ANCEF) IV  1 g Intravenous Once  . ceFAZolin (ANCEF) IV  2 g Intravenous Q T,Th,Sa-HD  . darbepoetin (ARANESP) injection - DIALYSIS  100 mcg Intravenous Q Tue-HD  . darunavir  800 mg Oral Q breakfast  . famotidine  20 mg Oral Q24H  . feeding supplement  30 mL Oral BID WC  . feeding supplement  1 Container Oral BID WC  . ferric glucontate (NULECIT) IV  125 mg Intravenous Q Thu-HD  . heparin  5,000 Units Subcutaneous Q8H  . lamiVUDine  25 mg Oral Daily  . multivitamin  1 tablet Oral QHS  . ritonavir  100 mg Oral Q breakfast  . stavudine  20 mg Oral Daily  . sulfamethoxazole-trimethoprim  1 tablet Oral Q M,W,F  . DISCONTD: povidone-iodine   Topical Once    BMET  Lab 12/29/10 0552 12/28/10 0500 12/26/10 1243 12/25/10 0500 12/24/10 0530 12/24/10 0150 12/23/10 2021  NA 137 136 132* 135 130* 133* 130*  K 3.6 3.3* 3.8 4.0 3.5 3.6 3.8  CL 103 102 100 100 95* 98 94*  CO2 26 27 28 29 24 22 25   GLUCOSE 73 93 73 82 90 70 79  BUN 19 15 24* 14 39* 37* 35*  CREATININE 6.37* 4.82* 5.15* 3.14* 6.72* 6.37* 6.22*  ALB -- -- -- -- -- -- --  CALCIUM 8.5 8.1* 7.5* 7.7* 7.5* 7.5* 7.8*  PHOS 2.3 2.0* 2.7 -- -- -- --   CBC  Lab 12/30/10 1340 12/29/10 0552 12/28/10 0500 12/26/10 1242 12/23/10 2021  WBC 3.3* 2.7* 2.7* 3.3* --    NEUTROABS -- -- -- -- 7.9*  HGB 8.1* 8.8* 9.0* 8.3* --  HCT 25.9* 28.4* 28.9* 25.7* --  MCV 102.0* 102.2* 102.5* 101.6* --  PLT 192 193 193 174 --    Assessment/Plan:  1. MSSA bacteremia - on Ancef, afebrile, WBCs of 3300. She had a three-day catheter holiday and is now status post tunneled catheter placement. She appears to be clinical stable for discharge after hemodialysis if her status has not changed. 2. ESRD - on HD on TTS, dialysis today while here and to continue on Thursday at her outpatient center. This will be coordinated at the time of discharge.  3. Anemia - on Aranesp on Tues, Fe on Thurs, Hgb 8.8. Aranesp with HD tomorrow.  4. Secondary hyperparathyroidism - Off binders, we'll follow this with outpatient calcium/phosphorus monitoring as well as management of her metabolic bone disease.  5. Nutrition - Alb 1.8, chronically low albumin due to chronic HIV infection/recurrent episodes of catheter-related bacteremia.  6. HIV - followed by ID.  Mardell Suttles K.

## 2010-12-30 NOTE — H&P (View-Only) (Signed)
VASCULAR PROGRESS NOTE  SUBJECTIVE: no complaints  PHYSICAL EXAM: Filed Vitals:   12/28/10 1700 12/28/10 2108 12/29/10 0553 12/29/10 0900  BP: 131/73 121/68 146/81 128/81  Pulse: 75 72 63 88  Temp: 97.8 F (36.6 C) 98 F (36.7 C) 98.2 F (36.8 C) 99 F (37.2 C)  TempSrc: Oral Oral Oral Oral  Resp: 19 18 20 19   Height:      Weight:  136 lb 11 oz (62 kg)    SpO2: 98% 97% 96% 99%   Lungs: clear to auscultation  LABS: Lab Results  Component Value Date   WBC 2.7* 12/29/2010   HGB 8.8* 12/29/2010   HCT 28.4* 12/29/2010   MCV 102.2* 12/29/2010   PLT 193 12/29/2010   Lab Results  Component Value Date   CREATININE 6.37* 12/29/2010   Lab Results  Component Value Date   INR 1.32 12/24/2010   ASSESSMENT/PLAN: 1. The patient is scheduled for placement of a tunneled dialysis catheter tomorrow. She is afebrile. I have discussed the procedure with her. All of her questions were answered.  [name], MD, FACS Beeper: [phone] 12/29/2010

## 2010-12-30 NOTE — Progress Notes (Signed)
Subjective: Pt seen post tunnel dialysis catheter placement. Still fairly drousy from anesthesia but arousable. Will be getting dialysis today. No complaints, no pain admitted.  Objective: Vital signs in last 24 hours: Filed Vitals:   12/29/10 0900 12/29/10 1700 12/29/10 2146 12/30/10 0513  BP: 128/81 124/76 112/61 125/73  Pulse: 88 92 79 70  Temp: 99 F (37.2 C) 97.9 F (36.6 C) 98.1 F (36.7 C) 98.3 F (36.8 C)  TempSrc: Oral Oral Oral Oral  Resp: 19 20 18 18   Height:      Weight:   138 lb 0.1 oz (62.6 kg)   SpO2: 99% 97% 96% 99%   Weight change: 1 lb 5.2 oz (0.6 kg)  Intake/Output Summary (Last 24 hours) at 12/30/10 0820 Last data filed at 12/29/10 2200  Gross per 24 hour  Intake   1080 ml  Output      0 ml  Net   1080 ml    Physical Exam: VItal signs reviewed and stable. GEN: No apparent distress.  Drowsy but arousable.Cooperative to exam. HEENT: head is autraumatic and normocephalic.  Neck is supple without palpable masses or lymphadenopathy.  No JVD or carotid bruits.  Vision intact.  EOMI.  PERRLA.  Sclerae anicteric.   RESP:  Lungs are clear to ascultation bilaterally with good air movement.  No wheezes, ronchi, or rubs. CARDIOVASCULAR: regular rate, normal rhythm.  Clear S1, S2, no murmurs, gallops, or rubs. ABDOMEN: soft, non-tender, non-distended.  Bowels sounds present in all quadrants and normoactive.  No palpable masses. EXT: warm and dry.   No clubbing or cyanosis.  No edema in bilateral lower extremities. R dialysis catheter tunnel was placed today in the OR by vascular surgery. SKIN: warm and dry with normal turgor.  No rashes or abnormal lesions observed.  Lab Results: Basic Metabolic Panel:  Lab [id number] 12/28/10 0500  NA 137 136  K 3.6 3.3*  CL 103 102  CO2 26 27  GLUCOSE 73 93  BUN 19 15  CREATININE 6.37* 4.82*  CALCIUM 8.5 8.1*  MG -- --  PHOS 2.3 2.0*   CBC:  Lab 12/29/10 0552 12/28/10 0500 12/23/10 2021  WBC 2.7* 2.7* --    NEUTROABS -- -- 7.9*  HGB 8.8* 9.0* --  HCT 28.4* 28.9* --  MCV 102.2* 102.5* --  PLT 193 193 --     Micro Results: Recent Results (from the past 240 hour(s))  CULTURE, BLOOD (ROUTINE X 2)     Status: Normal   Collection Time   12/23/10  8:10 PM      Component Value Range Status Comment   Specimen Description BLOOD ARM LEFT   Final    Special Requests BOTTLES DRAWN AEROBIC AND ANAEROBIC 10CC   Final    Setup Time [id number]   Final    Culture     Final    Value: STAPHYLOCOCCUS AUREUS     Note: RIFAMPIN AND GENTAMICIN SHOULD NOT BE USED AS SINGLE DRUGS FOR TREATMENT OF STAPH INFECTIONS.     Note: Gram Stain Report Called to,Read Back By and Verified With: [facility] @ [id number] 12/24/10 WICKN   Report Status 12/26/2010 FINAL   Final    Organism ID, Bacteria STAPHYLOCOCCUS AUREUS   Final   CULTURE, BLOOD (ROUTINE X 2)     Status: Normal   Collection Time   12/23/10  8:20 PM      Component Value Range Status Comment   Specimen Description BLOOD ARM RIGHT   Final  Special Requests BOTTLES DRAWN AEROBIC AND ANAEROBIC 10CC   Final    Setup Time [id number]   Final    Culture     Final    Value: STAPHYLOCOCCUS AUREUS     Note: SUSCEPTIBILITIES PERFORMED ON PREVIOUS CULTURE WITHIN THE LAST 5 DAYS.     Note: Gram Stain Report Called to,Read Back By and Verified With: [facility] @ 1215 12/24/10 WICKN   Report Status 12/26/2010 FINAL   Final   MRSA PCR SCREENING     Status: Normal   Collection Time   12/24/10  1:18 AM      Component Value Range Status Comment   MRSA by PCR NEGATIVE  NEGATIVE  Final   CATH TIP CULTURE     Status: Normal   Collection Time   12/27/10  8:36 AM      Component Value Range Status Comment   Specimen Description CATH TIP   Final    Special Requests LEFT CHEST   Final    Culture NO GROWTH 2 DAYS   Final    Report Status 12/29/2010 FINAL   Final   CULTURE, BLOOD (ROUTINE X 2)     Status: Normal (Preliminary result)   Collection Time   12/27/10  11:20 AM      Component Value Range Status Comment   Specimen Description BLOOD HAND RIGHT   Final    Special Requests BOTTLES DRAWN AEROBIC ONLY 5CC   Final    Setup Time [id number]   Final    Culture     Final    Value:        BLOOD CULTURE RECEIVED NO GROWTH TO DATE CULTURE WILL BE HELD FOR 5 DAYS BEFORE ISSUING A FINAL NEGATIVE REPORT   Report Status PENDING   Incomplete   CULTURE, BLOOD (ROUTINE X 2)     Status: Normal (Preliminary result)   Collection Time   12/27/10 11:35 AM      Component Value Range Status Comment   Specimen Description BLOOD HAND LEFT   Final    Special Requests BOTTLES DRAWN AEROBIC ONLY 6CC   Final    Setup Time [id number]   Final    Culture     Final    Value:        BLOOD CULTURE RECEIVED NO GROWTH TO DATE CULTURE WILL BE HELD FOR 5 DAYS BEFORE ISSUING A FINAL NEGATIVE REPORT   Report Status PENDING   Incomplete   CULTURE, BLOOD (SINGLE)     Status: Normal (Preliminary result)   Collection Time   12/28/10  9:15 AM      Component Value Range Status Comment   Specimen Description BLOOD RIGHT HAND   Final    Special Requests     Final    Value: BOTTLES DRAWN AEROBIC AND ANAEROBIC BLUE 3CC RED [place]   Setup Time [id number]   Final    Culture     Final    Value:        BLOOD CULTURE RECEIVED NO GROWTH TO DATE CULTURE WILL BE HELD FOR 5 DAYS BEFORE ISSUING A FINAL NEGATIVE REPORT   Report Status PENDING   Incomplete     Medications: I have reviewed the patient's current medications.  Assessment/Plan: MSSA Bacteremia: septic shock as a result of MSSA bacteremia is resolved; pt remains afebrile and hemodynamically stable.  Infected HD cath is the likely source of her infection. She is tolerating Ancef well and will need to continue IV abx for 6  weeks starting from the date of the first negative blood culture.  Blood cultures were drawn 12/21, 12/22, 12/23. So far all prelim negative. Got new tunnel dialysis access put in today by vascular surgery. Plan for  HD today. If pt is stable could consider D/C tomorrow- Continue Ancef for 6 weeks (start date = date of first neg blood cx) with HD  - F/U results of blood cx drawn (12/21 shows no growth to date, 12/22 shows no growth to date, 12/23 shows no growth to date) - Consider TEE if bacteremia persists to eval for endocarditis - Appreciate renal input and assistance with Ms. 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  #HTN: stable  #ESRD: HD to resume today with placement of tunnel dialysis placement. Will follow today and possible D/C tomorrow. Will call dialysis and find out what I need to do to get ancef Q HD session for 6 weeks post culture.  - Next HD planned for Monday 12/24 - Will continue to f/u renal recommendations  #Malnutrition - Albumin 1.8, due to ESRD. Is on feeding supplement.  Dispo: possible d/c tomorrow if stable following placement of HD access and HD today. Placement went well. Pt will need to continue IV abx w/ HD. Will speak with renal PA about setting this up with out-pt HD.   Vertell Novak 12/30/2010, 8:20 AM

## 2010-12-30 NOTE — Anesthesia Postprocedure Evaluation (Signed)
  Anesthesia Post-op Note  Patient: [name]  Procedure(s) Performed:  INSERTION OF DIALYSIS CATHETER - Insertion of left internal jugular dialysis catheter  Patient Location: PACU  Anesthesia Type: MAC  Level of Consciousness: awake  Airway and Oxygen Therapy: Patient Spontanous Breathing  Post-op Pain: none  Post-op Assessment: Post-op Vital signs reviewed  Post-op Vital Signs: stable  Complications: No apparent anesthesia complications

## 2010-12-30 NOTE — Progress Notes (Signed)
Pt. Noted to have blood in hair when arrived to PACU, DR. Fields aware, stated it dripped from HD catheter insertion.

## 2010-12-30 NOTE — Transfer of Care (Signed)
Immediate Anesthesia Transfer of Care Note  Patient: [name] Likes  Procedure(s) Performed:  INSERTION OF DIALYSIS CATHETER - Insertion of left internal jugular dialysis catheter  Patient Location: PACU  Anesthesia Type: MAC  Level of Consciousness: oriented, lethargic and responds to stimulation  Airway & Oxygen Therapy: Patient Spontanous Breathing and Patient connected to face mask oxygen  Post-op Assessment: Report given to PACU RN  Post vital signs: Reviewed and stable  Complications: No apparent anesthesia complications

## 2010-12-30 NOTE — Anesthesia Preprocedure Evaluation (Addendum)
Anesthesia Evaluation  Patient identified by MRN, date of birth, ID band Patient awake    Reviewed: Allergy & Precautions, H&P , NPO status , Patient's Chart, lab work & pertinent test results  Airway Mallampati: II TM Distance: <3 FB Neck ROM: Full    Dental  (+) Dental Advisory Given, Edentulous Upper and Edentulous Lower   Pulmonary pneumonia ,  clear to auscultation        Cardiovascular hypertension, Regular Normal    Neuro/Psych    GI/Hepatic   Endo/Other    Renal/GU      Musculoskeletal   Abdominal   Peds  Hematology   Anesthesia Other Findings   Reproductive/Obstetrics                          Anesthesia Physical Anesthesia Plan  ASA: III  Anesthesia Plan: MAC   Post-op Pain Management:    Induction: Intravenous  Airway Management Planned: Mask and Natural Airway  Additional Equipment:   Intra-op Plan:   Post-operative Plan:   Informed Consent:   Plan Discussed with: CRNA  Anesthesia Plan Comments:         Anesthesia Quick Evaluation

## 2010-12-30 NOTE — Interval H&P Note (Signed)
History and Physical Interval Note:  12/30/2010 7:36 AM  [name]  has presented today for surgery, with the diagnosis of End Stage Renal Disease  The various methods of treatment have been discussed with the patient and family. After consideration of risks, benefits and other options for treatment, the patient has consented to  Procedure(s): INSERTION OF DIALYSIS CATHETER as a surgical intervention .  The patients' history has been reviewed, patient examined, no change in status, stable for surgery.  I have reviewed the patients' chart and labs.  Questions were answered to the patient's satisfaction.     Kemya Shed E

## 2010-12-30 NOTE — Progress Notes (Signed)
Pt off unit

## 2010-12-30 NOTE — Procedures (Signed)
I was present at this session.  I have reviewed the session itself and made appropriate changes.  Jailon Schaible K. 12/24/20122:01 PM

## 2010-12-30 NOTE — Op Note (Signed)
Procedure: Ultrasound-guided insertion of Diatek catheter left internal jugular vein  Preoperative diagnosis: End-stage renal disease  Postoperative diagnosis: Same  Anesthesia: Local with IV sedation  Operative findings: 23 cm Diatek catheter left internal jugular vein  Occluded right internal jugular vein  Operative details: After obtaining informed consent, the patient was taken to the operating room. The patient was placed in supine position on the operating room table. After adequate sedation the patient's entire neck and chest were prepped and draped in usual sterile fashion. The patient was placed in Trendelenburg position. The right internal jugular vein was localized with ultrasound and found to be occluded.  Ultrasound was used to identify the patient's left internal jugular vein. This had normal compressibility and respiratory variation. Local anesthesia was infiltrated over the left jugular vein.  Using ultrasound guidance, the left internal jugular vein was successfully cannulated.  A 0.035 J-tipped guidewire was threaded into the left internal jugular vein and into the superior vena cava followed by the inferior vena cava under fluoroscopic guidance.   Next sequential 12 and 14 dilators were placed over the guidewire into the right atrium.  A 16 French dilator with a peel-away sheath was then placed over the guidewire into the right atrium.   The guidewire and dilator were removed. A 23 cm Diatek catheter was then placed through the peel away sheath into the right atrium.  The catheter was then tunneled subcutaneously, cut to length, and the hub attached. The catheter was noted to flush and draw easily. The catheter was inspected under fluoroscopy and found with its tip to be in the right atrium without any kinks throughout its course. The catheter was sutured to the skin with nylon sutures. The neck insertion site was closed with Vicryl stitch. The catheter was then loaded with concentrated  Heparin solution. A dry sterile dressing was applied.  The patient tolerated procedure well and there were no complications. Instrument sponge and needle counts correct in the case. The patient was taken to the recovery room in stable condition. Chest x-ray will be obtained in the recovery room.  Ruta Hinds, MD [facility] Office: [phone] Pager: [phone]

## 2010-12-30 NOTE — Preoperative (Signed)
Beta Blockers   Reason not to administer Beta Blockers:Not Applicable 

## 2010-12-31 MED ORDER — DARUNAVIR ETHANOLATE 400 MG PO TABS: 800.0000 mg | ORAL_TABLET | Freq: Every day | ORAL | Status: DC

## 2010-12-31 NOTE — Progress Notes (Signed)
Patient ID: [name], female   DOB: [birth year], 54 y.o.   MRN: [id number]  S: Reports to be feeling well except for some pain over the new catheter insertion site. Ambulating around her room without problems.   O: BP 95/63  Pulse 84  Temp(Src) 98.7 F (37.1 C) (Oral)  Resp 18  Ht 5\' 3"  (1.6 m)  Wt 62.5 kg (137 lb 12.6 oz)  BMI 24.41 kg/m2  SpO2 98%  [id number] ambulating in her room. Meal tray empty. [id number] RRR, Normal S1 and S2 Resp:CTA bilaterally, no rales/rhonchi [id number], flat, non-tender, BS normal Ext:No LE edema.       [name] aspirin EC  81 mg Oral Daily  . B-complex with vitamin C  1 tablet Oral Daily  . ceFAZolin (ANCEF) IV  1 g Intravenous Once  . ceFAZolin (ANCEF) IV  2 g Intravenous Q T,Th,Sa-HD  . darbepoetin (ARANESP) injection - DIALYSIS  100 mcg Intravenous Q Tue-HD  . darunavir  800 mg Oral Q breakfast  . famotidine  20 mg Oral Q24H  . feeding supplement  30 mL Oral BID WC  . feeding supplement  1 Container Oral BID WC  . ferric glucontate (NULECIT) IV  125 mg Intravenous Q Thu-HD  . heparin  5,000 Units Subcutaneous Q8H  . lamiVUDine  25 mg Oral Daily  . multivitamin  1 tablet Oral QHS  . ritonavir  100 mg Oral Q breakfast  . stavudine  20 mg Oral Daily  . sulfamethoxazole-trimethoprim  1 tablet Oral Q M,W,F  . DISCONTD: povidone-iodine   Topical Once    BMET  Lab 12/30/10 1340 12/29/10 0552 12/28/10 0500 12/26/10 1243 12/25/10 0500  NA 136 137 136 132* 135  K 3.4* 3.6 3.3* 3.8 4.0  CL 102 103 102 100 100  CO2 26 26 27 28 29   GLUCOSE 96 73 93 73 82  BUN 26* 19 15 24* 14  CREATININE 7.80* 6.37* 4.82* 5.15* 3.14*  ALB -- -- -- -- --  CALCIUM 8.2* 8.5 8.1* 7.5* 7.7*  PHOS 3.2 2.3 2.0* 2.7 --   CBC  Lab 12/30/10 1340 12/29/10 0552 12/28/10 0500 12/26/10 1242  WBC 3.3* 2.7* 2.7* 3.3*  NEUTROABS -- -- -- --  HGB 8.1* 8.8* 9.0* 8.3*  HCT 25.9* 28.4* 28.9* 25.7*  MCV 102.0* 102.2* 102.5* 101.6*  PLT 192 193 193 174   Recent Results  (from the past 240 hour(s))  CULTURE, BLOOD (ROUTINE X 2)     Status: Normal   Collection Time   12/23/10  8:10 PM      Component Value Range Status Comment   Specimen Description BLOOD ARM LEFT   Final    Special Requests BOTTLES DRAWN AEROBIC AND ANAEROBIC 10CC   Final    Setup Time [id number]   Final    Culture     Final    Value: STAPHYLOCOCCUS AUREUS     Note: RIFAMPIN AND GENTAMICIN SHOULD NOT BE USED AS SINGLE DRUGS FOR TREATMENT OF STAPH INFECTIONS.     Note: Gram Stain Report Called to,Read Back By and Verified With: [facility] @ [id number] 12/24/10 WICKN   Report Status 12/26/2010 FINAL   Final    Organism ID, Bacteria STAPHYLOCOCCUS AUREUS   Final   CULTURE, BLOOD (ROUTINE X 2)     Status: Normal   Collection Time   12/23/10  8:20 PM      Component Value Range Status Comment   Specimen Description BLOOD ARM RIGHT   Final  Special Requests BOTTLES DRAWN AEROBIC AND ANAEROBIC 10CC   Final    Setup Time [id number]   Final    Culture     Final    Value: STAPHYLOCOCCUS AUREUS     Note: SUSCEPTIBILITIES PERFORMED ON PREVIOUS CULTURE WITHIN THE LAST 5 DAYS.     Note: Gram Stain Report Called to,Read Back By and Verified With: [facility] @ 1215 12/24/10 WICKN   Report Status 12/26/2010 FINAL   Final   MRSA PCR SCREENING     Status: Normal   Collection Time   12/24/10  1:18 AM      Component Value Range Status Comment   MRSA by PCR NEGATIVE  NEGATIVE  Final   CATH TIP CULTURE     Status: Normal   Collection Time   12/27/10  8:36 AM      Component Value Range Status Comment   Specimen Description CATH TIP   Final    Special Requests LEFT CHEST   Final    Culture NO GROWTH 2 DAYS   Final    Report Status 12/29/2010 FINAL   Final   CULTURE, BLOOD (ROUTINE X 2)     Status: Normal (Preliminary result)   Collection Time   12/27/10 11:20 AM      Component Value Range Status Comment   Specimen Description BLOOD HAND RIGHT   Final    Special Requests BOTTLES DRAWN AEROBIC  ONLY 5CC   Final    Setup Time [id number]   Final    Culture     Final    Value:        BLOOD CULTURE RECEIVED NO GROWTH TO DATE CULTURE WILL BE HELD FOR 5 DAYS BEFORE ISSUING A FINAL NEGATIVE REPORT   Report Status PENDING   Incomplete   CULTURE, BLOOD (ROUTINE X 2)     Status: Normal (Preliminary result)   Collection Time   12/27/10 11:35 AM      Component Value Range Status Comment   Specimen Description BLOOD HAND LEFT   Final    Special Requests BOTTLES DRAWN AEROBIC ONLY 6CC   Final    Setup Time [id number]   Final    Culture     Final    Value:        BLOOD CULTURE RECEIVED NO GROWTH TO DATE CULTURE WILL BE HELD FOR 5 DAYS BEFORE ISSUING A FINAL NEGATIVE REPORT   Report Status PENDING   Incomplete   CULTURE, BLOOD (SINGLE)     Status: Normal (Preliminary result)   Collection Time   12/28/10  9:15 AM      Component Value Range Status Comment   Specimen Description BLOOD RIGHT HAND   Final    Special Requests     Final    Value: BOTTLES DRAWN AEROBIC AND ANAEROBIC BLUE 3CC RED [place]   Setup Time [id number]   Final    Culture     Final    Value:        BLOOD CULTURE RECEIVED NO GROWTH TO DATE CULTURE WILL BE HELD FOR 5 DAYS BEFORE ISSUING A FINAL NEGATIVE REPORT   Report Status PENDING   Incomplete   CULTURE, BLOOD (ROUTINE X 2)     Status: Normal (Preliminary result)   Collection Time   12/29/10  9:50 AM      Component Value Range Status Comment   Specimen Description BLOOD ARM LEFT   Final    Special Requests BOTTLES DRAWN AEROBIC AND ANAEROBIC 10.[place]  Final    Setup Time [id number]   Final    Culture     Final    Value:        BLOOD CULTURE RECEIVED NO GROWTH TO DATE CULTURE WILL BE HELD FOR 5 DAYS BEFORE ISSUING A FINAL NEGATIVE REPORT   Report Status PENDING   Incomplete   CULTURE, BLOOD (ROUTINE X 2)     Status: Normal (Preliminary result)   Collection Time   12/29/10 10:00 AM      Component Value Range Status Comment   Specimen Description BLOOD ARM  RIGHT   Final    Special Requests BOTTLES DRAWN AEROBIC AND ANAEROBIC 10.[place]   Final    Setup Time [id number]   Final    Culture     Final    Value:        BLOOD CULTURE RECEIVED NO GROWTH TO DATE CULTURE WILL BE HELD FOR 5 DAYS BEFORE ISSUING A FINAL NEGATIVE REPORT   Report Status PENDING   Incomplete      Assessment/Plan:  1. MSSA bacteremia - on Ancef, afebrile, WBCs of 3300. She had a three-day catheter holiday and is now status post tunneled catheter placement. She appears to be clinical stable for discharge today and resume outpatient hemodialysis starting Thursday. We'll transmit information to the center she needs 6 weeks of total therapy duration with Ancef.  2. ESRD - on HD on TTS, dialysis today while here and to continue on Thursday at her outpatient center. The patient is aware of this and plans to followup as scheduled. 3. Anemia - low hemoglobin noted, suspect resistance to ESA from ongoing infection/inflammation complex. No overt loss is noted. 4. Secondary hyperparathyroidism - Off binders, we'll follow this with outpatient calcium/phosphorus monitoring as well as management of her metabolic bone disease.  5. Nutrition - Alb 1.8, chronically low albumin due to chronic HIV infection/recurrent episodes of catheter-related bacteremia.  6. HIV - followed by ID.   Kayse Puccini K.

## 2010-12-31 NOTE — Progress Notes (Signed)
Subjective: Pt seen walking around. She is having some slight neck stiffness but otherwise feels good this morning. No complaints. Dialysis went fine yesterday.   Objective: Vital signs in last 24 hours: Filed Vitals:   12/30/10 1720 12/30/10 1839 12/30/10 2255 12/31/10 0534  BP: 102/60 109/71 102/66 100/65  Pulse: 70 70 80 72  Temp: 97 F (36.1 C) 98.2 F (36.8 C) 98.3 F (36.8 C) 98.1 F (36.7 C)  TempSrc: Oral Oral Oral Oral  Resp: 16 18 16 18   Height:      Weight: 138 lb 14.2 oz (63 kg)  137 lb 12.6 oz (62.5 kg)   SpO2: 98% 94% 92% 95%   Weight change: 2 lb 10.3 oz (1.2 kg)  Intake/Output Summary (Last 24 hours) at 12/31/10 0728 Last data filed at 12/31/10 0437  Gross per 24 hour  Intake    150 ml  Output    594 ml  Net   -444 ml    Physical Exam: VItal signs reviewed and stable. GEN: No apparent distress.  A and O times 3 HEENT: head is autraumatic and normocephalic.  Neck is supple without palpable masses or lymphadenopathy.  No JVD or carotid bruits.  Vision intact.  EOMI.  PERRLA.  Sclerae anicteric.   RESP:  Lungs are clear to ascultation bilaterally with good air movement.  No wheezes, ronchi, or rubs. CARDIOVASCULAR: regular rate, normal rhythm.  Clear S1, S2, no murmurs, gallops, or rubs. ABDOMEN: soft, non-tender, non-distended.  Bowels sounds present in all quadrants and normoactive.  No palpable masses. EXT: warm and dry.   No clubbing or cyanosis.  No edema in bilateral lower extremities. R dialysis catheter tunnel was placed yesterday in the OR by vascular surgery. SKIN: warm and dry with normal turgor.  No rashes or abnormal lesions observed.  Lab Results: Basic Metabolic Panel:  Lab [id number] 1340 12/29/10 0552  NA 136 137  K 3.4* 3.6  CL 102 103  CO2 26 26  GLUCOSE 96 73  BUN 26* 19  CREATININE 7.80* 6.37*  CALCIUM 8.2* 8.5  MG -- --  PHOS 3.2 2.3   CBC:  Lab 12/30/10 1340 12/29/10 0552  WBC 3.3* 2.7*  NEUTROABS -- --  HGB 8.1* 8.8*  HCT  25.9* 28.4*  MCV 102.0* 102.2*  PLT 192 193     Micro Results: Recent Results (from the past 240 hour(s))  CULTURE, BLOOD (ROUTINE X 2)     Status: Normal   Collection Time   12/23/10  8:10 PM      Component Value Range Status Comment   Specimen Description BLOOD ARM LEFT   Final    Special Requests BOTTLES DRAWN AEROBIC AND ANAEROBIC 10CC   Final    Setup Time [id number]   Final    Culture     Final    Value: STAPHYLOCOCCUS AUREUS     Note: RIFAMPIN AND GENTAMICIN SHOULD NOT BE USED AS SINGLE DRUGS FOR TREATMENT OF STAPH INFECTIONS.     Note: Gram Stain Report Called to,Read Back By and Verified With: [facility] @ [id number] 12/24/10 WICKN   Report Status 12/26/2010 FINAL   Final    Organism ID, Bacteria STAPHYLOCOCCUS AUREUS   Final   CULTURE, BLOOD (ROUTINE X 2)     Status: Normal   Collection Time   12/23/10  8:20 PM      Component Value Range Status Comment   Specimen Description BLOOD ARM RIGHT   Final    Special Requests BOTTLES  DRAWN AEROBIC AND ANAEROBIC 10CC   Final    Setup Time [id number]   Final    Culture     Final    Value: STAPHYLOCOCCUS AUREUS     Note: SUSCEPTIBILITIES PERFORMED ON PREVIOUS CULTURE WITHIN THE LAST 5 DAYS.     Note: Gram Stain Report Called to,Read Back By and Verified With: [facility] @ 1215 12/24/10 WICKN   Report Status 12/26/2010 FINAL   Final   MRSA PCR SCREENING     Status: Normal   Collection Time   12/24/10  1:18 AM      Component Value Range Status Comment   MRSA by PCR NEGATIVE  NEGATIVE  Final   CATH TIP CULTURE     Status: Normal   Collection Time   12/27/10  8:36 AM      Component Value Range Status Comment   Specimen Description CATH TIP   Final    Special Requests LEFT CHEST   Final    Culture NO GROWTH 2 DAYS   Final    Report Status 12/29/2010 FINAL   Final   CULTURE, BLOOD (ROUTINE X 2)     Status: Normal (Preliminary result)   Collection Time   12/27/10 11:20 AM      Component Value Range Status Comment    Specimen Description BLOOD HAND RIGHT   Final    Special Requests BOTTLES DRAWN AEROBIC ONLY 5CC   Final    Setup Time [id number]   Final    Culture     Final    Value:        BLOOD CULTURE RECEIVED NO GROWTH TO DATE CULTURE WILL BE HELD FOR 5 DAYS BEFORE ISSUING A FINAL NEGATIVE REPORT   Report Status PENDING   Incomplete   CULTURE, BLOOD (ROUTINE X 2)     Status: Normal (Preliminary result)   Collection Time   12/27/10 11:35 AM      Component Value Range Status Comment   Specimen Description BLOOD HAND LEFT   Final    Special Requests BOTTLES DRAWN AEROBIC ONLY 6CC   Final    Setup Time [id number]   Final    Culture     Final    Value:        BLOOD CULTURE RECEIVED NO GROWTH TO DATE CULTURE WILL BE HELD FOR 5 DAYS BEFORE ISSUING A FINAL NEGATIVE REPORT   Report Status PENDING   Incomplete   CULTURE, BLOOD (SINGLE)     Status: Normal (Preliminary result)   Collection Time   12/28/10  9:15 AM      Component Value Range Status Comment   Specimen Description BLOOD RIGHT HAND   Final    Special Requests     Final    Value: BOTTLES DRAWN AEROBIC AND ANAEROBIC BLUE 3CC RED Carey   Setup Time [id number]   Final    Culture     Final    Value:        BLOOD CULTURE RECEIVED NO GROWTH TO DATE CULTURE WILL BE HELD FOR 5 DAYS BEFORE ISSUING A FINAL NEGATIVE REPORT   Report Status PENDING   Incomplete   CULTURE, BLOOD (ROUTINE X 2)     Status: Normal (Preliminary result)   Collection Time   12/29/10  9:50 AM      Component Value Range Status Comment   Specimen Description BLOOD ARM LEFT   Final    Special Requests BOTTLES DRAWN AEROBIC AND ANAEROBIC 10.Sibley   Final  Setup Time [id number]   Final    Culture     Final    Value:        BLOOD CULTURE RECEIVED NO GROWTH TO DATE CULTURE WILL BE HELD FOR 5 DAYS BEFORE ISSUING A FINAL NEGATIVE REPORT   Report Status PENDING   Incomplete   CULTURE, BLOOD (ROUTINE X 2)     Status: Normal (Preliminary result)   Collection Time   12/29/10  10:00 AM      Component Value Range Status Comment   Specimen Description BLOOD ARM RIGHT   Final    Special Requests BOTTLES DRAWN AEROBIC AND ANAEROBIC 10.Haliimaile   Final    Setup Time [id number]   Final    Culture     Final    Value:        BLOOD CULTURE RECEIVED NO GROWTH TO DATE CULTURE WILL BE HELD FOR 5 DAYS BEFORE ISSUING A FINAL NEGATIVE REPORT   Report Status PENDING   Incomplete     Medications: I have reviewed the patient's current medications.  Assessment/Plan: MSSA Bacteremia: septic shock as a result of MSSA bacteremia is resolved; pt remains afebrile and hemodynamically stable.  Infected HD cath is the likely source of her infection. She is tolerating Ancef well and will need to continue IV abx for 6 weeks starting from the date of the first negative blood culture.  Blood cultures were drawn 12/21, 12/22, 12/23. So far all prelim negative. Got new tunnel dialysis access put in yesterday by vascular surgery.   #HIV: continue ART  #AOCD: Hbg remains stable and at baseline.  #HTN: stable  #ESRD: HD to resume normal schedule with ancef for 6 weeks with start date 12/27/10. Spoke with renal doctor and this has been coordinated with her out-pt dialysis.   #Malnutrition - Albumin 1.8, due to ESRD. Is on feeding supplement.  Dispo: pt will be discharged today.   [name] 12/31/2010, 7:28 AM

## 2010-12-31 NOTE — Discharge Summary (Signed)
Internal [facility] Discharge Note  Name: [name] MRN: [id number] DOB: 19-Oct-1955 55 y.o.  Date of Admission: 12/23/2010  7:07 PM Date of Discharge: 12/31/2010 Attending Physician: Milta Deiters  Discharge Diagnosis: Principal Problem:  *Septic shock Active Problems:  HIV INFECTION  ANEMIA OF CHRONIC DISEASE  THROMBOCYTOPENIA  HYPERTENSION, MILD  CONGESTIVE CARDIOMYOPATHY  CHRONIC KIDNEY DISEASE STAGE IV (SEVERE)  LEG EDEMA, CHRONIC  Pneumonia  Malnutrition due to renal disease  End-stage renal disease  FOLLOW UP: [facility] - please check on her functional status and make sure she has made appointment with ID clinic. Follow up chest x-ray in 5 weeks. She will need ancef with dialysis for 6 weeks starting 12/27/10.   Discharge Medications: Current Discharge Medication List    START taking these medications   Details  darunavir (PREZISTA) 400 MG tablet Take 2 tablets (800 mg total) by mouth daily with breakfast. Qty: 60 tablet, Refills: 1      CONTINUE these medications which have NOT CHANGED   Details  acetaminophen (TYLENOL) 650 MG CR tablet Take 650 mg by mouth every 8 (eight) hours as needed. For pain.    aspirin 81 MG tablet Take 81 mg by mouth daily.      b complex vitamins tablet Take 1 tablet by mouth daily.      b complex-vitamin c-folic acid (NEPHRO-VITE) 0.8 MG TABS Take 0.8 mg by mouth daily.      darbepoetin (ARANESP, ALBUMIN FREE,) 150 MCG/0.3ML SOLN Inject 150 mcg into the skin. Use weekly for Epogen 20000 units. Dialysis nurse to administer.     ENSURE (ENSURE) Take 237 mLs by mouth 3 (three) times daily between meals.      famotidine (PEPCID) 20 MG tablet Take 20 mg by mouth daily.      iron sucrose (VENOFER) 20 MG/ML injection 100 mg IV on Tuesday HD(HD RN to dispense)     lamiVUDine (EPIVIR) 10 MG/ML solution Take 1 teaspoon by mouth every day.     paricalcitol (ZEMPLAR) 5 MCG/ML injection 4 micrograms IV-  dispensed by dialysis nurse.     ritonavir (NORVIR) 100 MG TABS Take 100 mg by mouth daily with breakfast.     Stavudine (ZERIT) 1 MG/ML SOLR Take 4 teaspoons(20 ml) by mouth every day.     sulfamethoxazole-trimethoprim (BACTRIM DS) 800-160 MG per tablet Take 1 tablet by mouth daily. Take 1 tablet by mouth every Monday, Wednesday and Friday.      STOP taking these medications     atazanavir (REYATAZ) 300 MG capsule         Disposition and follow-up:   [name] was discharged from [facility] in Stable condition.    Follow-up Appointments: Follow-up Information    Follow up with GARG,ANKIT on 01/02/2011. (We will call you with appointment date.)       Follow up with Alcide Evener, MD. Make an appointment in 14 days.   Contact information:   [address] [place] [phone]       Follow up with Ulla Potash., MD. (As needed, continue to attend dialysis)    Contact information:   [place] [phone]         Discharge Orders    Future Orders Please Complete By Expires   Diet - low sodium heart healthy      Increase activity slowly  Discharge wound care:      Comments:   Please keep dialysis site intact and clean.   Call MD for:  temperature >100.4      Call MD for:  persistant nausea and vomiting      Call MD for:  severe uncontrolled pain      Call MD for:  redness, tenderness, or signs of infection (pain, swelling, redness, odor or green/yellow discharge around incision site)      Call MD for:  persistant dizziness or light-headedness         Consultations: Treatment Team:  Clayborne Dana. Posey Pronto, MD Theotis Burrow, MD Vascular Surgery Dr. Rachelle Hora  Procedures Performed:  Dg Chest 2 View  12/23/2010  *RADIOLOGY REPORT*  Clinical Data: Fever, confusion, hypoxia  CHEST - 2 VIEW  Comparison: 09/26/2010  Findings: Right  lower lobe opacity, suspicious for pneumonia. No pleural effusion or pneumothorax.  Stable cardiomegaly.  Stable left-sided dual lumen dialysis catheter.  Mild degenerative changes of the visualized thoracolumbar spine.  IMPRESSION: Right lower lobe opacity, suspicious for pneumonia.  Stable cardiomegaly.  Original Report Authenticated By: Julian Hy, M.D.   Dg Chest Port 1 View  12/30/2010  *RADIOLOGY REPORT*  Clinical Data: Dialysis catheter placement.  PORTABLE CHEST - 1 VIEW  Comparison: 01/17/1910  Findings: Left dialysis catheters in place.  The tips are in the right atrium.  Mild cardiomegaly with vascular congestion.  Low lung volumes with bibasilar atelectasis.  No pneumothorax.  IMPRESSION: Left dialysis catheter tips in the right atrium.  No pneumothorax.  Cardiomegaly, vascular congestion and bibasilar atelectasis.  Original Report Authenticated By: Raelyn Number, M.D.   Dg Chest Port 1 View  12/25/2010  *RADIOLOGY REPORT*  Clinical Data: Respiratory failure  PORTABLE CHEST - 1 VIEW  Comparison: Chest radiograph 12/24/2010  Findings: The split lumen left dialysis catheters unchanged. Stable enlarged heart silhouette.  Mild pulmonary edema pattern is similar to prior.  No pneumothorax.  IMPRESSION: No interval change.  Cardiomegaly and mild pulmonary edema.  Original Report Authenticated By: Suzy Bouchard, M.D.   Portable Chest Xray  12/24/2010  *RADIOLOGY REPORT*  Clinical Data: Unsuccessful right IJ line placement  PORTABLE CHEST - 1 VIEW  Comparison: 12/24/2010  Findings: Cardiomegaly with mild interstitial edema.  Right lower lobe opacity, possibly reflecting pneumonia.  Increasing left upper lobe/paramediastinal opacity, likely reflecting edema given right interval change.  No pneumothorax.  Stable left sided dual lumen dialysis catheter.  IMPRESSION: No pneumothorax following unsuccessful line placement.  Cardiomegaly with mild interstitial edema.  Right lower lobe opacity,  possibly reflecting pneumonia.  Original Report Authenticated By: Julian Hy, M.D.   Dg Chest Port 1 View  12/24/2010  *RADIOLOGY REPORT*  Clinical Data: Attempted central line placement.  PORTABLE CHEST - 1 VIEW  Comparison: 12/23/2010  Findings: Left subclavian large bore catheter with tips projecting over the mid and distal SVC.  No pneumothorax.  Cardiomegaly and prominent mediastinal contours, similar to prior. Interstitial prominence and bibasilar opacities.  Left axilla surgical clips. No acute osseous abnormality.  IMPRESSION: No pneumothorax status post line attempt.  Otherwise stable chest.  Original Report Authenticated By: Suanne Marker, M.D.   Dg Fluoro Guide Cv Line-no Report  12/30/2010  CLINICAL DATA: intaop   FLOURO GUIDE CV LINE  Fluoroscopy was utilized by the requesting physician.  No radiographic  interpretation.     Admission HPI:  [name] is a 55 y.o. female who has a complicated access history.  1. In October 2009  she had a left upper arm AV graft placed. She was noted at that time to have a high bifurcation of her brachial artery she later presented with venous hypertension and massive edema of her left upper arm had removal of the graft to repair of the axillary artery with a vein patch. This apparently bled and was replaced with a bovine pericardial patch. 2. She had dehiscence of this and required left axillary artery to radial artery bypass in November of 2009.  3. Of note in January 2010 she had incision and drainage of 2 large lymphoceles her right side and require placement of a VAC.  4. In May of 2010 she had a right upper arm AV fistula placed which ultimately failed.  5. She had a new right upper arm graft placed in June of 2010 and has had multiple thrombectomies of her graft.  6. In September 2012 she had removal of and suspected infected AV graft of the right upper arm and primary repair of the axillary artery. She had a left IJ catheter placed  in September.  Based on my review of her records it does not appear that she is a candidate for a basilic vein transposition in either arm. She's failed previous fistula creation on the right and I do not see any further options for upper extremity access.  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 She presents with persistent hypotension and was admitted to [facility] service. Her X ray showed RLL PNA with B/L heterogeneous opacities. Her Influenza A, B ,H1N1 were all negative. Patient had septic shock which was treated with sepsis protocol including levophed, IV steroids, pan culture and broaden ABX including Vancomycin, Zosyn and Cipro for HCAP. ID was consulted on her HIV management ( her last HIV viral load 99900/CD4 270 with 19% CD4 helper T cell)and renal consulted on her ESRD with HD. Her Blood culture 2/2 indicates positive MSSA and his ABX regimen was changed to Ancef. Since her sepsis is thought to be related to her HD catheter. The treatment of Ancef will need 6 weeks with the first day counting with 1st negative blood culture. Her RIJ HD catheter will need to be removed and new access will be evaluated by renal and VVS.  Patient's condition has largely improved after treatment , and her septic shock is completely resolved. She will be transferred to [facility] IM [facility] for further management.   Admission PHYSICAL EXAM:  Filed Vitals:    12/11/10 1105   BP:  108/66   Pulse:  67   Resp:  16   Height:  5\' 9"  (1.753 m)   Weight:  133 lb (60.328 kg)   SpO2:  100%    Body mass index is 19.64 kg/(m^2).  GENERAL: The patient is a well-nourished female, in no acute distress. The vital signs are documented above.  CARDIOVASCULAR: There is a regular rate and rhythm without significant murmur  appreciated.  PULMONARY: There is good air exchange bilaterally without wheezing or rales.  She has a weakly palpable radial pulse bilaterally. She has palpable femoral pulses bilaterally. She has a diminished right dorsalis pedis pulse a palpable left dorsalis pedis pulse. Significant lymphedema the left leg with lesser degree of lymphedema in the right leg.   Hospital Course by problem list:  Principal Problem:  *Septic shock - patient presented with sepsis and septic shock and blood cultures did show MSSA bacteremia. She was treated with broad-spectrum antibiotics via IV and then therapy was switched to Ancef only after results of blood culture were obtained. She did have an infected line graft site. She did continue to get dialysis during this hospitalization. She did receive pressors and steroids in the ICU. Once she was stabilized to leave the ICU she did come to the floor. She did have right lower lobe pneumonia on admission. This has resolved during hospital course. Septic shock did completely resolve during stay in ICU.  Active Problems:  HIV INFECTION - this is a chronic problem, it was addressed during this hospitalization. She does have poor compliance with her medications. An infectious disease consult was obtained during hospital course for optimization of her medical regimen. We did change her medical regimen slightly. We would like her to followup with infectious disease doctor. We will continue her prophylaxis for low CD4 count on discharge.   ANEMIA OF CHRONIC DISEASE - due to end-stage renal disease and HIV. Renal doctors are managing. She will continue to get Aranesp and iron at her hemodialysis sessions. Hemoglobin was at baseline during this hospitalization and she did not require transfusions.   THROMBOCYTOPENIA - chronic likely due to end-stage renal disease. It is stable.   HYPERTENSION, MILD - blood pressure was low upon admission. She did require pressors during this  hospitalization. Her blood pressure was stable for several days prior to her discharge.   CONGESTIVE CARDIOMYOPATHY - patient did come in with volume overload. She did continue to get hemodialysis sessions during this hospitalization. Would recommend she continue to get hemodialysis sessions after this hospitalization.   LEG EDEMA, CHRONIC - stable not addressed during this hospitalization.   Pneumonia - she was treated for this during ICU stay. She has current resolution of the symptoms. Would recommend followup chest x-ray in 6 weeks to check on resolution.   Malnutrition due to renal disease - albumin was 1.8 during this hospitalization. She did get nutritional supplements. She is on nutritional supplements at home and I would advise to continue these. Likely due to her end-stage renal disease and her HIV status.   End-stage renal disease - she is hemodialysis are aligned. She has had multiple site infections of grafts and access points in the past. She did have an access point that was infected during this hospitalization causing bacteremia. She will need treatment with Ancef IV at hemodialysis sections for 6 weeks starting December 21. I have talked to the renal doctors and they have assured me that she will be able to get her Ancef with hemodialysis. Vascular surgeons did create a new tunnel dialysis access in the right groin. It was placed with no complications. The vascular surgeons have stated that she has no further options for upper extremity dialysis access. No TEE was obtained as blood cultures did clear with line holiday.   Discharge Vitals:  BP 95/63  Pulse 84  Temp(Src) 98.7 F (37.1 C) (Oral)  Resp 18  Ht 5\' 3"  (1.6 m)  Wt 137 lb 12.6 oz (62.5 kg)  BMI 24.41 kg/m2  SpO2 98%  Discharge Labs:  Results for orders placed during the hospital encounter of 12/23/10 (from the past 24 hour(s))  CBC     Status: Abnormal   Collection Time   12/30/10  1:40 PM      Component Value  Range   WBC 3.3 (*)  4.0 - 10.5 (K/uL)   RBC 2.54 (*) 3.87 - 5.11 (MIL/uL)   Hemoglobin 8.1 (*) 12.0 - 15.0 (g/dL)   HCT 25.9 (*) 36.0 - 46.0 (%)   MCV 102.0 (*) 78.0 - 100.0 (fL)   MCH 31.9  26.0 - 34.0 (pg)   MCHC 31.3  30.0 - 36.0 (g/dL)   RDW 15.6 (*) 11.5 - 15.5 (%)   Platelets 192  150 - 400 (K/uL)  RENAL FUNCTION PANEL     Status: Abnormal   Collection Time   12/30/10  1:40 PM      Component Value Range   Sodium 136  135 - 145 (mEq/L)   Potassium 3.4 (*) 3.5 - 5.1 (mEq/L)   Chloride 102  96 - 112 (mEq/L)   CO2 26  19 - 32 (mEq/L)   Glucose, Bld 96  70 - 99 (mg/dL)   BUN 26 (*) 6 - 23 (mg/dL)   Creatinine, Ser 7.80 (*) 0.50 - 1.10 (mg/dL)   Calcium 8.2 (*) 8.4 - 10.5 (mg/dL)   Phosphorus 3.2  2.3 - 4.6 (mg/dL)   Albumin 1.7 (*) 3.5 - 5.2 (g/dL)   GFR calc non Af Amer 5 (*) >90 (mL/min)   GFR calc Af Amer 6 (*) >90 (mL/min)    Signed: Vertell Novak 12/31/2010, 9:58 AM

## 2010-12-31 NOTE — H&P (Deleted)
[facility] Discharge Note  Name: [name] MRN: [id number] DOB: [birth year] 55 y.o.  Date of Admission: 12/23/2010  7:07 PM Date of Discharge: 12/31/2010 Attending Physician: Milta Deiters  Discharge Diagnosis: Principal Problem:  *Septic shock Active Problems:  HIV INFECTION  ANEMIA OF CHRONIC DISEASE  THROMBOCYTOPENIA  HYPERTENSION, MILD  CONGESTIVE CARDIOMYOPATHY  CHRONIC KIDNEY DISEASE STAGE IV (SEVERE)  LEG EDEMA, CHRONIC  Pneumonia  Malnutrition due to renal disease  End-stage renal disease  FOLLOW UP: [facility] - please check on her functional status and make sure she has made appointment with ID clinic. Follow up chest x-ray in 5 weeks. She will need ancef with dialysis for 6 weeks starting 12/27/10.   Discharge Medications: Current Discharge Medication List    START taking these medications   Details  darunavir (PREZISTA) 400 MG tablet Take 2 tablets (800 mg total) by mouth daily with breakfast. Qty: 60 tablet, Refills: 1      CONTINUE these medications which have NOT CHANGED   Details  acetaminophen (TYLENOL) 650 MG CR tablet Take 650 mg by mouth every 8 (eight) hours as needed. For pain.    aspirin 81 MG tablet Take 81 mg by mouth daily.      b complex vitamins tablet Take 1 tablet by mouth daily.      b complex-vitamin c-folic acid (NEPHRO-VITE) 0.8 MG TABS Take 0.8 mg by mouth daily.      darbepoetin (ARANESP, ALBUMIN FREE,) 150 MCG/0.3ML SOLN Inject 150 mcg into the skin. Use weekly for Epogen 20000 units. Dialysis nurse to administer.     ENSURE (ENSURE) Take 237 mLs by mouth 3 (three) times daily between meals.      famotidine (PEPCID) 20 MG tablet Take 20 mg by mouth daily.      iron sucrose (VENOFER) 20 MG/ML injection 100 mg IV on Tuesday HD(HD RN to dispense)     lamiVUDine (EPIVIR) 10 MG/ML solution Take 1 teaspoon by mouth every day.     paricalcitol (ZEMPLAR) 5 MCG/ML injection 4 micrograms IV-  dispensed by dialysis nurse.     ritonavir (NORVIR) 100 MG TABS Take 100 mg by mouth daily with breakfast.     Stavudine (ZERIT) 1 MG/ML SOLR Take 4 teaspoons(20 ml) by mouth every day.     sulfamethoxazole-trimethoprim (BACTRIM DS) 800-160 MG per tablet Take 1 tablet by mouth daily. Take 1 tablet by mouth every Monday, Wednesday and Friday.      STOP taking these medications     atazanavir (REYATAZ) 300 MG capsule         Disposition and follow-up:   [name] was discharged from [facility] in Stable condition.    Follow-up Appointments: Follow-up Information    Follow up with GARG,ANKIT on 01/02/2011. (We will call you with appointment date.)       Follow up with Alcide Evener, MD. Make an appointment in 14 days.   Contact information:   [address] [place] [phone]       Follow up with Ulla Potash., MD. (As needed, continue to attend dialysis)    Contact information:   [place] [phone]         Discharge Orders    Future Orders Please Complete By Expires   Diet - low sodium heart healthy      Increase activity slowly  Discharge wound care:      Comments:   Please keep dialysis site intact and clean.   Call MD for:  temperature >100.4      Call MD for:  persistant nausea and vomiting      Call MD for:  severe uncontrolled pain      Call MD for:  redness, tenderness, or signs of infection (pain, swelling, redness, odor or green/yellow discharge around incision site)      Call MD for:  persistant dizziness or light-headedness         Consultations: Treatment Team:  Clayborne Dana. Posey Pronto, MD Theotis Burrow, MD Vascular Surgery Dr. Rachelle Hora  Procedures Performed:  Dg Chest 2 View  12/23/2010  *RADIOLOGY REPORT*  Clinical Data: Fever, confusion, hypoxia  CHEST - 2 VIEW  Comparison: 09/26/2010  Findings: Right  lower lobe opacity, suspicious for pneumonia. No pleural effusion or pneumothorax.  Stable cardiomegaly.  Stable left-sided dual lumen dialysis catheter.  Mild degenerative changes of the visualized thoracolumbar spine.  IMPRESSION: Right lower lobe opacity, suspicious for pneumonia.  Stable cardiomegaly.  Original Report Authenticated By: Julian Hy, M.D.   Dg Chest Port 1 View  12/30/2010  *RADIOLOGY REPORT*  Clinical Data: Dialysis catheter placement.  PORTABLE CHEST - 1 VIEW  Comparison: 01/17/1910  Findings: Left dialysis catheters in place.  The tips are in the right atrium.  Mild cardiomegaly with vascular congestion.  Low lung volumes with bibasilar atelectasis.  No pneumothorax.  IMPRESSION: Left dialysis catheter tips in the right atrium.  No pneumothorax.  Cardiomegaly, vascular congestion and bibasilar atelectasis.  Original Report Authenticated By: Raelyn Number, M.D.   Dg Chest Port 1 View  12/25/2010  *RADIOLOGY REPORT*  Clinical Data: Respiratory failure  PORTABLE CHEST - 1 VIEW  Comparison: Chest radiograph 12/24/2010  Findings: The split lumen left dialysis catheters unchanged. Stable enlarged heart silhouette.  Mild pulmonary edema pattern is similar to prior.  No pneumothorax.  IMPRESSION: No interval change.  Cardiomegaly and mild pulmonary edema.  Original Report Authenticated By: Suzy Bouchard, M.D.   Portable Chest Xray  12/24/2010  *RADIOLOGY REPORT*  Clinical Data: Unsuccessful right IJ line placement  PORTABLE CHEST - 1 VIEW  Comparison: 12/24/2010  Findings: Cardiomegaly with mild interstitial edema.  Right lower lobe opacity, possibly reflecting pneumonia.  Increasing left upper lobe/paramediastinal opacity, likely reflecting edema given right interval change.  No pneumothorax.  Stable left sided dual lumen dialysis catheter.  IMPRESSION: No pneumothorax following unsuccessful line placement.  Cardiomegaly with mild interstitial edema.  Right lower lobe opacity,  possibly reflecting pneumonia.  Original Report Authenticated By: Julian Hy, M.D.   Dg Chest Port 1 View  12/24/2010  *RADIOLOGY REPORT*  Clinical Data: Attempted central line placement.  PORTABLE CHEST - 1 VIEW  Comparison: 12/23/2010  Findings: Left subclavian large bore catheter with tips projecting over the mid and distal SVC.  No pneumothorax.  Cardiomegaly and prominent mediastinal contours, similar to prior. Interstitial prominence and bibasilar opacities.  Left axilla surgical clips. No acute osseous abnormality.  IMPRESSION: No pneumothorax status post line attempt.  Otherwise stable chest.  Original Report Authenticated By: Suanne Marker, M.D.   Dg Fluoro Guide Cv Line-no Report  12/30/2010  CLINICAL DATA: intaop   FLOURO GUIDE CV LINE  Fluoroscopy was utilized by the requesting physician.  No radiographic  interpretation.     Admission HPI:  [name] is a 55 y.o. female who has a complicated access history.  1. In October 2009  she had a left upper arm AV graft placed. She was noted at that time to have a high bifurcation of her brachial artery she later presented with venous hypertension and massive edema of her left upper arm had removal of the graft to repair of the axillary artery with a vein patch. This apparently bled and was replaced with a bovine pericardial patch. 2. She had dehiscence of this and required left axillary artery to radial artery bypass in November of 2009.  3. Of note in January 2010 she had incision and drainage of 2 large lymphoceles her right side and require placement of a VAC.  4. In May of 2010 she had a right upper arm AV fistula placed which ultimately failed.  5. She had a new right upper arm graft placed in June of 2010 and has had multiple thrombectomies of her graft.  6. In September 2012 she had removal of and suspected infected AV graft of the right upper arm and primary repair of the axillary artery. She had a left IJ catheter placed  in September.  Based on my review of her records it does not appear that she is a candidate for a basilic vein transposition in either arm. She's failed previous fistula creation on the right and I do not see any further options for upper extremity access.  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 She presents with persistent hypotension and was admitted to [facility] service. Her X ray showed RLL PNA with B/L heterogeneous opacities. Her Influenza A, B ,H1N1 were all negative. Patient had septic shock which was treated with sepsis protocol including levophed, IV steroids, pan culture and broaden ABX including Vancomycin, Zosyn and Cipro for HCAP. ID was consulted on her HIV management ( her last HIV viral load 99900/CD4 270 with 19% CD4 helper T cell)and renal consulted on her ESRD with HD. Her Blood culture 2/2 indicates positive MSSA and his ABX regimen was changed to Ancef. Since her sepsis is thought to be related to her HD catheter. The treatment of Ancef will need 6 weeks with the first day counting with 1st negative blood culture. Her RIJ HD catheter will need to be removed and new access will be evaluated by renal and VVS.  Patient's condition has largely improved after treatment , and her septic shock is completely resolved. She will be transferred to [facility] IM TP for further management.   Admission PHYSICAL EXAM:  Filed Vitals:    12/11/10 1105   BP:  108/66   Pulse:  67   Resp:  16   Height:  5\' 9"  (1.753 m)   Weight:  133 lb (60.328 kg)   SpO2:  100%    Body mass index is 19.64 kg/(m^2).  GENERAL: The patient is a well-nourished female, in no acute distress. The vital signs are documented above.  CARDIOVASCULAR: There is a regular rate and rhythm without significant murmur  appreciated.  PULMONARY: There is good air exchange bilaterally without wheezing or rales.  She has a weakly palpable radial pulse bilaterally. She has palpable femoral pulses bilaterally. She has a diminished right dorsalis pedis pulse a palpable left dorsalis pedis pulse. Significant lymphedema the left leg with lesser degree of lymphedema in the right leg.   Hospital Course by problem list:  Principal Problem:  *Septic shock - patient presented with sepsis and septic shock and blood cultures did show MSSA bacteremia. She was treated with broad-spectrum antibiotics via IV and then therapy was switched to Ancef only after results of blood culture were obtained. She did have an infected line graft site. She did continue to get dialysis during this hospitalization. She did receive pressors and steroids in the ICU. Once she was stabilized to leave the ICU she did come to the floor. She did have right lower lobe pneumonia on admission. This has resolved during hospital course. Septic shock did completely resolve during stay in ICU.  Active Problems:  HIV INFECTION - this is a chronic problem, it was addressed during this hospitalization. She does have poor compliance with her medications. An infectious disease consult was obtained during hospital course for optimization of her medical regimen. We did change her medical regimen slightly. We would like her to followup with infectious disease doctor. We will continue her prophylaxis for low CD4 count on discharge.   ANEMIA OF CHRONIC DISEASE - due to end-stage renal disease and HIV. Renal doctors are managing. She will continue to get Aranesp and iron at her hemodialysis sessions. Hemoglobin was at baseline during this hospitalization and she did not require transfusions.   THROMBOCYTOPENIA - chronic likely due to end-stage renal disease. It is stable.   HYPERTENSION, MILD - blood pressure was low upon admission. She did require pressors during this  hospitalization. Her blood pressure was stable for several days prior to her discharge.   CONGESTIVE CARDIOMYOPATHY - patient did come in with volume overload. She did continue to get hemodialysis sessions during this hospitalization. Would recommend she continue to get hemodialysis sessions after this hospitalization.   LEG EDEMA, CHRONIC - stable not addressed during this hospitalization.   Pneumonia - she was treated for this during ICU stay. She has current resolution of the symptoms. Would recommend followup chest x-ray in 6 weeks to check on resolution.   Malnutrition due to renal disease - albumin was 1.8 during this hospitalization. She did get nutritional supplements. She is on nutritional supplements at home and I would advise to continue these. Likely due to her end-stage renal disease and her HIV status.   End-stage renal disease - she is hemodialysis are aligned. She has had multiple site infections of grafts and access points in the past. She did have an access point that was infected during this hospitalization causing bacteremia. She will need treatment with Ancef IV at hemodialysis sections for 6 weeks starting December 21. I have talked to the renal doctors and they have assured me that she will be able to get her Ancef with hemodialysis. Vascular surgeons did create a new tunnel dialysis access in the right groin. It was placed with no complications. The vascular surgeons have stated that she has no further options for upper extremity dialysis access. No TEE was obtained as blood cultures did clear with line holiday.   Discharge Vitals:  BP 95/63  Pulse 84  Temp(Src) 98.7 F (37.1 C) (Oral)  Resp 18  Ht 5\' 3"  (1.6 m)  Wt 137 lb 12.6 oz (62.5 kg)  BMI 24.41 kg/m2  SpO2 98%  Discharge Labs:  Results for orders placed during the hospital encounter of 12/23/10 (from the past 24 hour(s))  CBC     Status: Abnormal   Collection Time   12/30/10  1:40 PM      Component Value  Range   WBC 3.3 (*)  4.0 - 10.5 (K/uL)   RBC 2.54 (*) 3.87 - 5.11 (MIL/uL)   Hemoglobin 8.1 (*) 12.0 - 15.0 (g/dL)   HCT 25.9 (*) 36.0 - 46.0 (%)   MCV 102.0 (*) 78.0 - 100.0 (fL)   MCH 31.9  26.0 - 34.0 (pg)   MCHC 31.3  30.0 - 36.0 (g/dL)   RDW 15.6 (*) 11.5 - 15.5 (%)   Platelets 192  150 - 400 (K/uL)  RENAL FUNCTION PANEL     Status: Abnormal   Collection Time   12/30/10  1:40 PM      Component Value Range   Sodium 136  135 - 145 (mEq/L)   Potassium 3.4 (*) 3.5 - 5.1 (mEq/L)   Chloride 102  96 - 112 (mEq/L)   CO2 26  19 - 32 (mEq/L)   Glucose, Bld 96  70 - 99 (mg/dL)   BUN 26 (*) 6 - 23 (mg/dL)   Creatinine, Ser 7.80 (*) 0.50 - 1.10 (mg/dL)   Calcium 8.2 (*) 8.4 - 10.5 (mg/dL)   Phosphorus 3.2  2.3 - 4.6 (mg/dL)   Albumin 1.7 (*) 3.5 - 5.2 (g/dL)   GFR calc non Af Amer 5 (*) >90 (mL/min)   GFR calc Af Amer 6 (*) >90 (mL/min)    Signed: Vertell Novak 12/31/2010, 9:58 AM

## 2011-01-02 ENCOUNTER — Encounter (HOSPITAL_COMMUNITY): Payer: Self-pay | Admitting: Vascular Surgery

## 2011-01-02 LAB — CULTURE, BLOOD (ROUTINE X 2)
Culture  Setup Time: 201212212256
Culture  Setup Time: 201212212255
Culture: NO GROWTH
Culture: NO GROWTH

## 2011-01-03 LAB — CULTURE, BLOOD (SINGLE)
Culture  Setup Time: 201212221853
Culture: NO GROWTH

## 2011-01-04 LAB — CULTURE, BLOOD (ROUTINE X 2)
Culture  Setup Time: 201212231739
Culture  Setup Time: 201212231739
Culture: NO GROWTH
Culture: NO GROWTH

## 2011-01-15 ENCOUNTER — Ambulatory Visit: Payer: Medicaid Other

## 2011-01-16 ENCOUNTER — Encounter: Payer: Medicaid Other | Admitting: Internal Medicine

## 2011-01-21 ENCOUNTER — Encounter: Payer: Self-pay | Admitting: Thoracic Diseases

## 2011-01-22 ENCOUNTER — Encounter: Payer: Self-pay | Admitting: Thoracic Diseases

## 2011-01-22 ENCOUNTER — Ambulatory Visit (INDEPENDENT_AMBULATORY_CARE_PROVIDER_SITE_OTHER): Payer: Medicaid Other | Admitting: Thoracic Diseases

## 2011-01-22 VITALS — BP 143/81 | HR 97 | Resp 18 | Ht 69.0 in | Wt 142.0 lb

## 2011-01-22 DIAGNOSIS — T148XXA Other injury of unspecified body region, initial encounter: Secondary | ICD-10-CM

## 2011-01-22 DIAGNOSIS — N186 End stage renal disease: Secondary | ICD-10-CM

## 2011-01-22 NOTE — Progress Notes (Signed)
[facility]  Postoperative Visit hemodialysis access   Date of Surgery: 12/30/10 placement new Left IJ HD cath. Surgeon: Cecille Amsterdam, MD   HPI: [name] is a 56 y.o. female who had a new left IJ Diatek cath placed by Dr. Oneida Alar on 12 /24/12. She was referred by Dr. Moshe Cipro today because there is and area of swelling where the old left IJ catheter was [placed. There is no erythema, drainage or pain at this site. It is not getting bigger in size  Physical Examination  Filed Vitals:   01/22/11 1413  BP: 143/81  Pulse: 97  Resp: 18    WDWN female in NAD.  left chest tunnel site Incision is healed There is a firm 1x1 cm pocket at the old catheter tunnel site. It is not red, and not draining. It is not tender. Needle aspirate (18G needle) of the area revealed few drops of old blood. There is also firmness in tunnel site to exit wound  Assessment/Plan [name] is a 56 y.o. year old with hematoma and no signs of infection at old catheter site. Could not feel any mass, like retained cuff, and only old dark blood aspirated. This hematoma should resorb with warm compresses. If becomes bigger or infected will I&D.  Clinic MD: CS Scot Dock, MD

## 2011-02-19 ENCOUNTER — Other Ambulatory Visit: Payer: Medicaid Other

## 2011-02-19 ENCOUNTER — Other Ambulatory Visit: Payer: Self-pay | Admitting: Licensed Clinical Social Worker

## 2011-02-19 DIAGNOSIS — B20 Human immunodeficiency virus [HIV] disease: Secondary | ICD-10-CM

## 2011-02-19 LAB — CBC WITH DIFFERENTIAL/PLATELET
Basophils Absolute: 0 10*3/uL (ref 0.0–0.1)
Basophils Relative: 1 % (ref 0–1)
Eosinophils Absolute: 0 10*3/uL (ref 0.0–0.7)
Eosinophils Relative: 2 % (ref 0–5)
HCT: 34.6 % — ABNORMAL LOW (ref 36.0–46.0)
Hemoglobin: 10.2 g/dL — ABNORMAL LOW (ref 12.0–15.0)
Lymphocytes Relative: 45 % (ref 12–46)
Lymphs Abs: 1 10*3/uL (ref 0.7–4.0)
MCH: 30 pg (ref 26.0–34.0)
MCHC: 29.5 g/dL — ABNORMAL LOW (ref 30.0–36.0)
MCV: 101.8 fL — ABNORMAL HIGH (ref 78.0–100.0)
Monocytes Absolute: 0.2 10*3/uL (ref 0.1–1.0)
Monocytes Relative: 11 % (ref 3–12)
Neutro Abs: 0.9 10*3/uL — ABNORMAL LOW (ref 1.7–7.7)
Neutrophils Relative %: 41 % — ABNORMAL LOW (ref 43–77)
Platelets: 214 10*3/uL (ref 150–400)
RBC: 3.4 MIL/uL — ABNORMAL LOW (ref 3.87–5.11)
RDW: 18.8 % — ABNORMAL HIGH (ref 11.5–15.5)
WBC: 2.1 10*3/uL — ABNORMAL LOW (ref 4.0–10.5)

## 2011-02-19 LAB — COMPLETE METABOLIC PANEL WITH GFR
ALT: 10 U/L (ref 0–35)
AST: 43 U/L — ABNORMAL HIGH (ref 0–37)
Albumin: 2.6 g/dL — ABNORMAL LOW (ref 3.5–5.2)
Alkaline Phosphatase: 111 U/L (ref 39–117)
BUN: 12 mg/dL (ref 6–23)
CO2: 29 mEq/L (ref 19–32)
Calcium: 8.6 mg/dL (ref 8.4–10.5)
Chloride: 98 mEq/L (ref 96–112)
Creat: 4.98 mg/dL — ABNORMAL HIGH (ref 0.50–1.10)
GFR, Est African American: 11 mL/min — ABNORMAL LOW
GFR, Est Non African American: 9 mL/min — ABNORMAL LOW
Glucose, Bld: 57 mg/dL — ABNORMAL LOW (ref 70–99)
Potassium: 3.5 mEq/L (ref 3.5–5.3)
Sodium: 134 mEq/L — ABNORMAL LOW (ref 135–145)
Total Bilirubin: 0.4 mg/dL (ref 0.3–1.2)
Total Protein: 10 g/dL — ABNORMAL HIGH (ref 6.0–8.3)

## 2011-02-19 LAB — RPR

## 2011-02-20 LAB — T-HELPER CELL (CD4) - (RCID CLINIC ONLY)
CD4 % Helper T Cell: 16 % — ABNORMAL LOW (ref 33–55)
CD4 T Cell Abs: 220 uL — ABNORMAL LOW (ref 400–2700)

## 2011-02-20 LAB — PATHOLOGIST SMEAR REVIEW

## 2011-02-24 LAB — HIV-1 RNA QUANT-NO REFLEX-BLD
HIV 1 RNA Quant: 737706 copies/mL — ABNORMAL HIGH (ref <20)
HIV-1 RNA Quant, Log: 5.87 {Log} — ABNORMAL HIGH (ref <1.30)

## 2011-03-05 ENCOUNTER — Telehealth: Payer: Self-pay | Admitting: *Deleted

## 2011-03-05 ENCOUNTER — Ambulatory Visit: Payer: Medicaid Other | Admitting: Infectious Diseases

## 2011-03-05 NOTE — Telephone Encounter (Signed)
Called patient and left message for her to call and reschedule appointment.  She no showed today. Myrtis Hopping CMA

## 2011-04-16 IMAGING — CT CT ABD-PELV W/O CM
2 of 4 series · 16 of 46 positions shown, 18 images · non-contrast
Comparison: Renal ultrasound performed 03/21/2007

CLINICAL DATA: Abdominal pain, nausea and fever.

CT ABDOMEN AND PELVIS WITHOUT CONTRAST
TECHNIQUE: Multidetector CT imaging of the abdomen and pelvis was
performed following the standard protocol without intravenous
contrast.

[Series 2: abd/pelv w/o 5.0 b31f st · axial · non-contrast · 0.80mm/px · z∈[+790,+1180]mm · 13 of 86 slices shown, 15 images]
[im 4/86  soft-tissue]
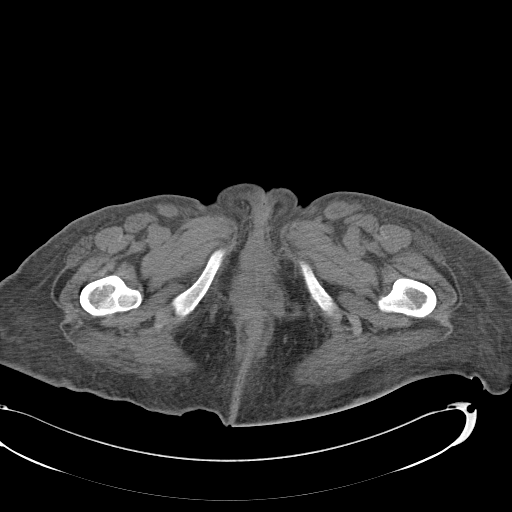
[im 4/86  bone]
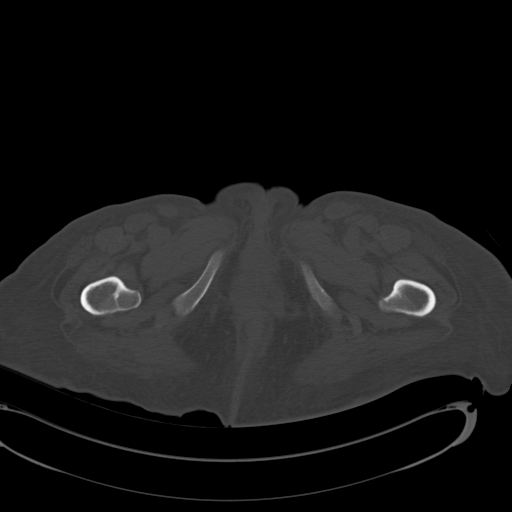
[im 12/86  soft-tissue]
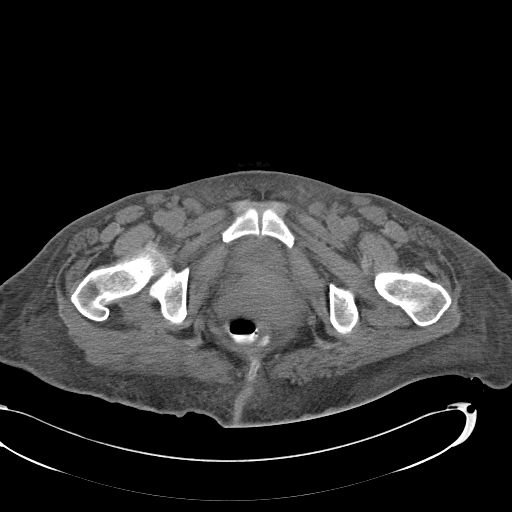
[im 19/86  soft-tissue]
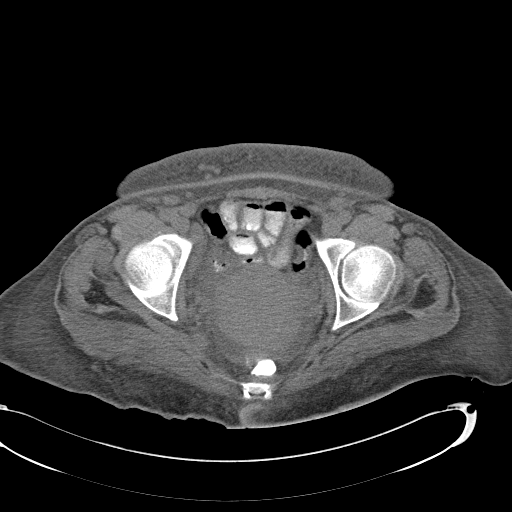
[im 23/86  soft-tissue]
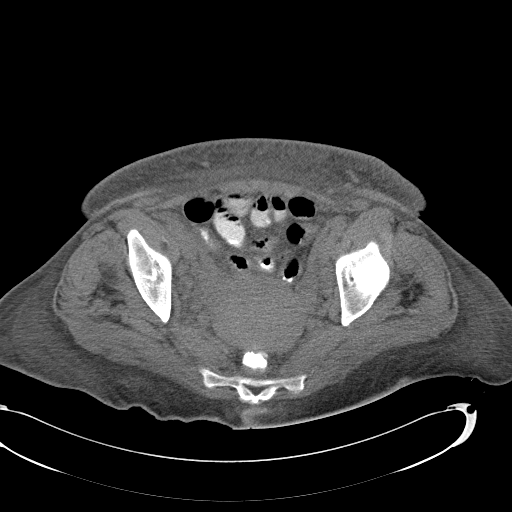
[im 30/86  soft-tissue]
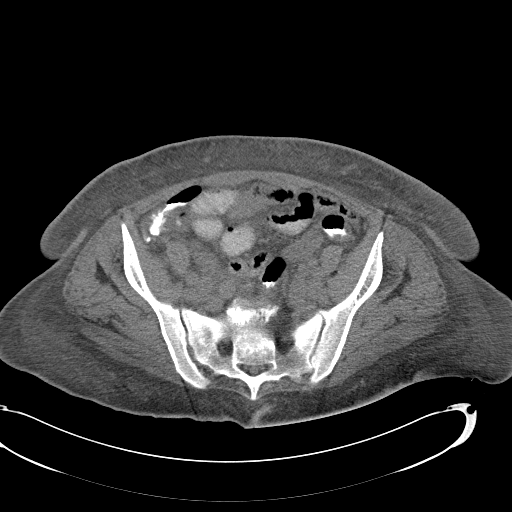
[im 37/86  soft-tissue]
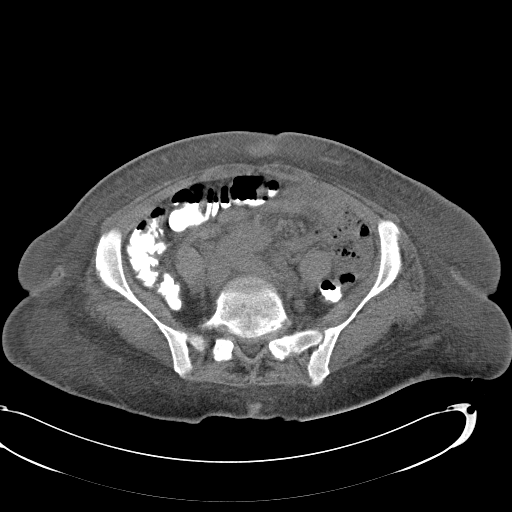
[im 45/86  soft-tissue]
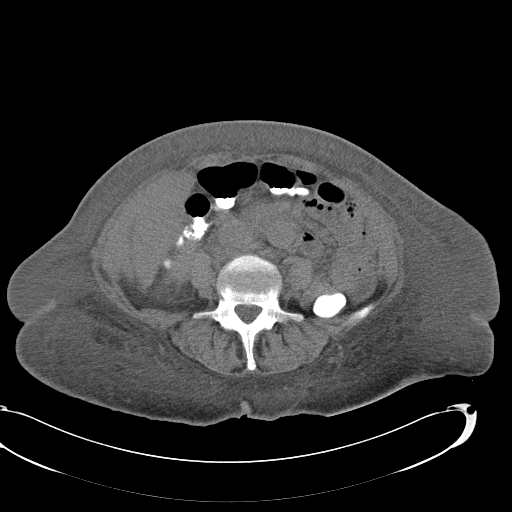
[im 49/86  soft-tissue]
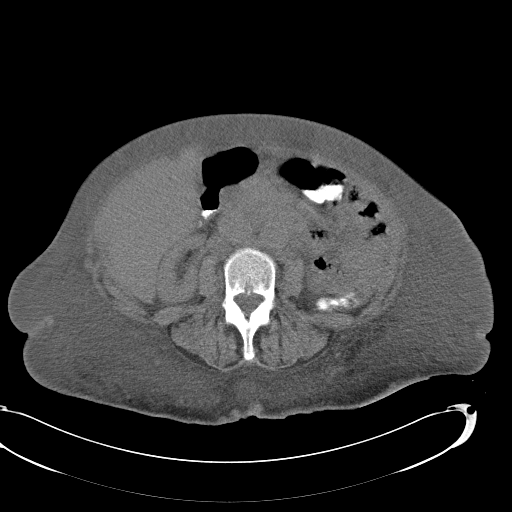
[im 56/86  soft-tissue]
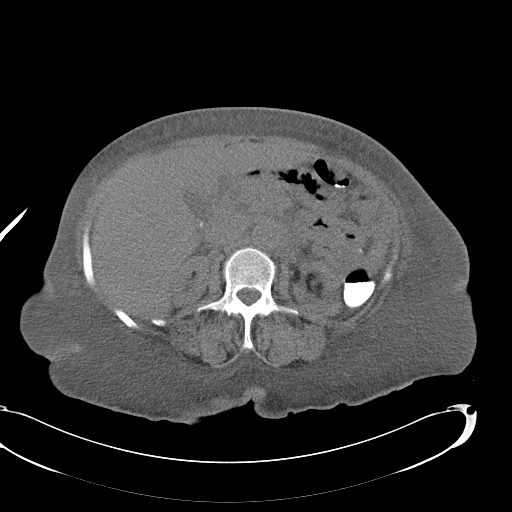
[im 56/86  bone]
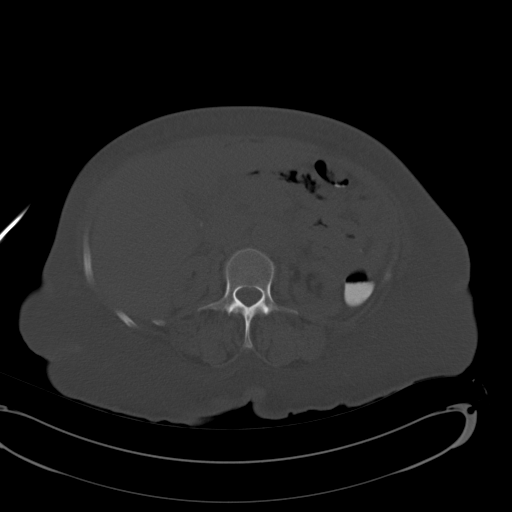
[im 63/86  soft-tissue]
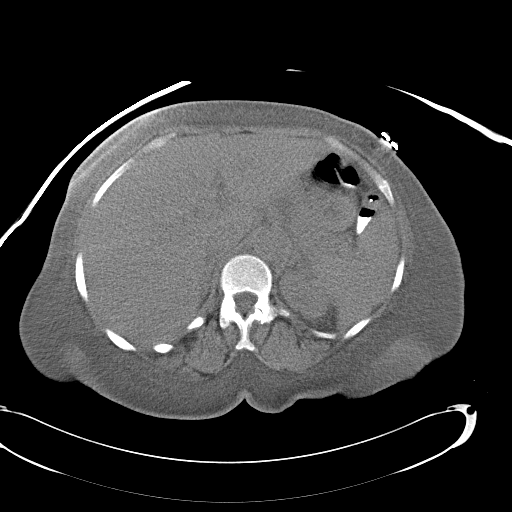
[im 67/86  soft-tissue]
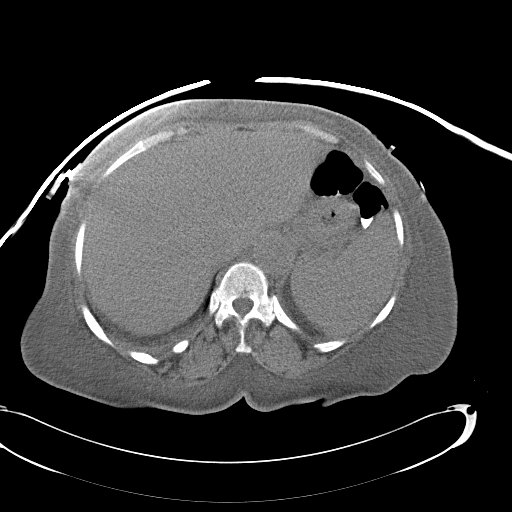
[im 74/86  soft-tissue]
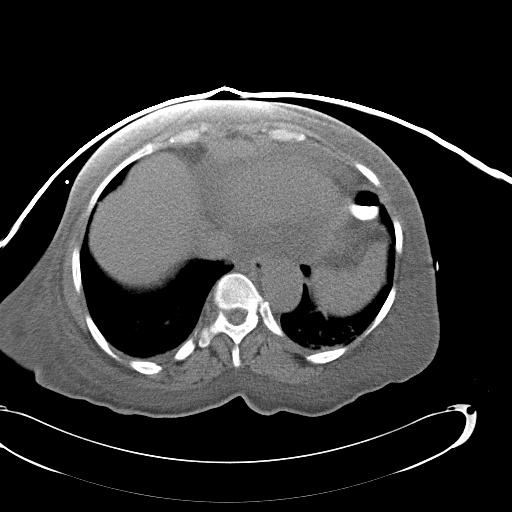
[im 82/86  soft-tissue]
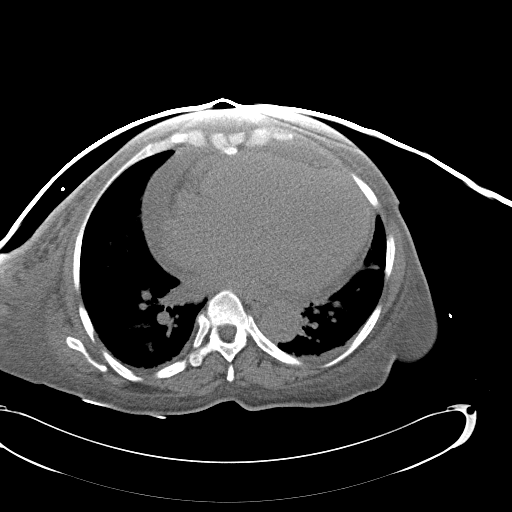

[Series 5: abd/pelv w/o 2.0 spo cor thins · coronal · non-contrast · 1.04mm/px · 3 of 131 slices shown]
[im 44/131  soft-tissue]
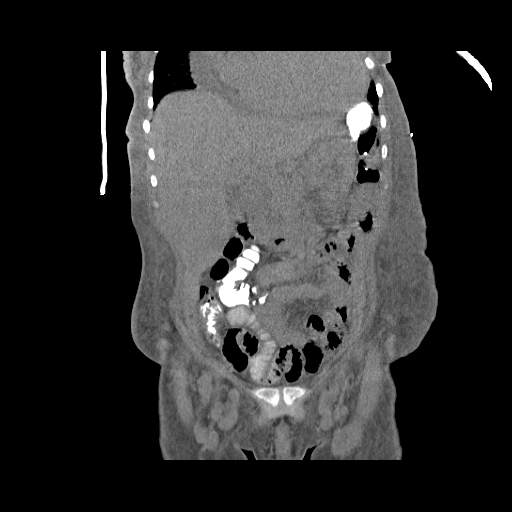
[im 58/131  soft-tissue]
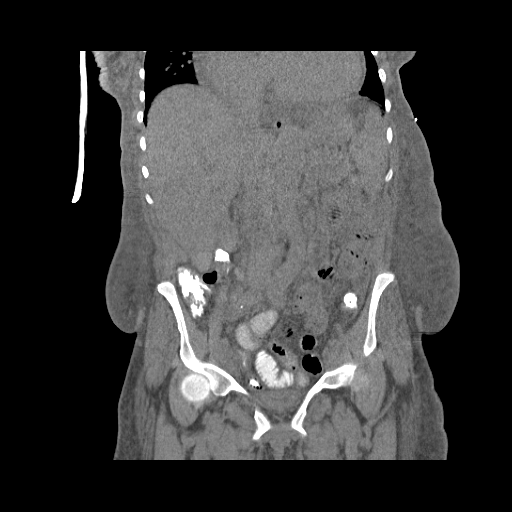
[im 73/131  soft-tissue]
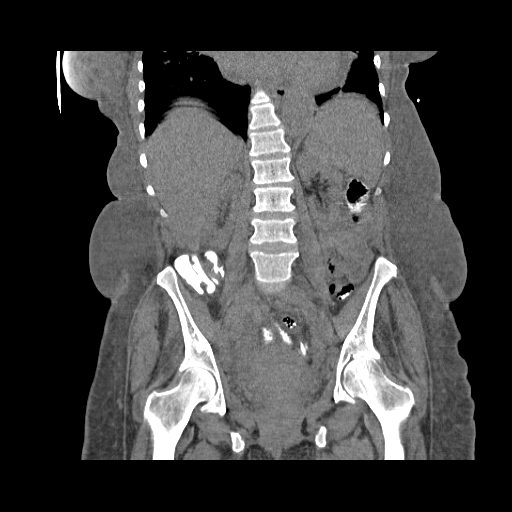

[16 of 46 positions shown; findings below may reference images not displayed]

FINDINGS: A moderate pericardial effusion is noted.  The heart is
enlarged.  Mild bibasilar atelectasis is seen.

There is diffuse anasarca of the visualized soft tissues of the
abdomen and pelvis, with diffuse intraperitoneal edema.

The liver and spleen are grossly unremarkable in appearance.  The
gallbladder is relatively decompressed and not well characterized.
The pancreas and adrenal glands are grossly unremarkable, though
difficult to characterize due to surrounding edema.  The kidneys
appear mildly atrophic; an exophytic 2.4 cm hypodense lesion
arising at the lower pole of the right kidney is not well
characterized but may reflect a cyst.  There is no evidence of
hydronephrosis.  No renal or ureteral stones are identified.

Contrast progresses through the small bowel and into the colon,
with contrast noted to the level of the rectosigmoid junction.  The
small bowel is not well assessed due to surrounding edema.  The
stomach is grossly unremarkable in appearance. No acute vascular
abnormalities are seen; the vasculature is not well assessed given
lack of contrast.

The appendix is not fully characterized, but appears grossly
unremarkable. The colon is filled with contrast and is grossly
unremarkable in appearance.

The bladder is largely decompressed.  The uterus is grossly
unremarkable in appearance; evaluation for fibroids is suboptimal
without contrast.  The ovaries are grossly unremarkable; no
suspicious adnexal masses are seen.  There is no definite evidence
for omental caking.

Prominent bilateral inguinal nodes are noted, measuring up to
cm in short axis.  Note is also made of apparent pelvic sidewall
lymphadenopathy bilaterally.  A small amount of free fluid is noted
within the pelvis.

No acute osseous abnormalities are seen.  There is degenerative
narrowing of the L5-S1 intervertebral disc space, with associated
vacuum phenomenon.
IMPRESSION: 1.  Diffuse anasarca involving the soft tissues of the abdomen and
pelvis, with diffuse intraperitoneal edema.
2.  Cardiomegaly and moderate pericardial effusion.
3.  Prominent bilateral inguinal nodes, measuring up to 1.5 cm in
short axis; apparent bilateral pelvic sidewall lymphadenopathy
noted.  Suggest clinical correlation for evidence of primary
malignancy.
4.  Small amount of free fluid seen within the pelvis.
5.  Likely 2.4 cm right renal cyst; mildly atrophic kidneys.
6.  Mild bibasilar atelectasis.
7.  Mild degenerative change at L5-S1.

## 2011-05-18 ENCOUNTER — Emergency Department (HOSPITAL_COMMUNITY): Payer: Medicaid Other

## 2011-05-18 ENCOUNTER — Encounter (HOSPITAL_COMMUNITY): Payer: Self-pay

## 2011-05-18 ENCOUNTER — Inpatient Hospital Stay (HOSPITAL_COMMUNITY)
Admission: EM | Admit: 2011-05-18 | Discharge: 2011-05-30 | DRG: 314 | Disposition: A | Discharge status: Home / Self Care | Payer: Medicaid Other | Attending: Internal Medicine | Admitting: Internal Medicine

## 2011-05-18 DIAGNOSIS — I4729 Other ventricular tachycardia: Secondary | ICD-10-CM | POA: Diagnosis not present

## 2011-05-18 DIAGNOSIS — I472 Ventricular tachycardia, unspecified: Secondary | ICD-10-CM | POA: Diagnosis not present

## 2011-05-18 DIAGNOSIS — K2970 Gastritis, unspecified, without bleeding: Secondary | ICD-10-CM | POA: Diagnosis present

## 2011-05-18 DIAGNOSIS — I330 Acute and subacute infective endocarditis: Secondary | ICD-10-CM | POA: Diagnosis present

## 2011-05-18 DIAGNOSIS — L989 Disorder of the skin and subcutaneous tissue, unspecified: Secondary | ICD-10-CM | POA: Diagnosis present

## 2011-05-18 DIAGNOSIS — I498 Other specified cardiac arrhythmias: Secondary | ICD-10-CM | POA: Diagnosis not present

## 2011-05-18 DIAGNOSIS — Z91199 Patient's noncompliance with other medical treatment and regimen due to unspecified reason: Secondary | ICD-10-CM

## 2011-05-18 DIAGNOSIS — Y841 Kidney dialysis as the cause of abnormal reaction of the patient, or of later complication, without mention of misadventure at the time of the procedure: Secondary | ICD-10-CM | POA: Diagnosis present

## 2011-05-18 DIAGNOSIS — I120 Hypertensive chronic kidney disease with stage 5 chronic kidney disease or end stage renal disease: Secondary | ICD-10-CM | POA: Diagnosis present

## 2011-05-18 DIAGNOSIS — K7200 Acute and subacute hepatic failure without coma: Secondary | ICD-10-CM | POA: Diagnosis present

## 2011-05-18 DIAGNOSIS — M949 Disorder of cartilage, unspecified: Secondary | ICD-10-CM | POA: Diagnosis present

## 2011-05-18 DIAGNOSIS — I252 Old myocardial infarction: Secondary | ICD-10-CM

## 2011-05-18 DIAGNOSIS — Z9119 Patient's noncompliance with other medical treatment and regimen: Secondary | ICD-10-CM

## 2011-05-18 DIAGNOSIS — T80211A Bloodstream infection due to central venous catheter, initial encounter: Principal | ICD-10-CM | POA: Diagnosis present

## 2011-05-18 DIAGNOSIS — M899 Disorder of bone, unspecified: Secondary | ICD-10-CM | POA: Diagnosis present

## 2011-05-18 DIAGNOSIS — N186 End stage renal disease: Secondary | ICD-10-CM

## 2011-05-18 DIAGNOSIS — R6520 Severe sepsis without septic shock: Secondary | ICD-10-CM

## 2011-05-18 DIAGNOSIS — D61818 Other pancytopenia: Secondary | ICD-10-CM | POA: Diagnosis present

## 2011-05-18 DIAGNOSIS — B59 Pneumocystosis: Secondary | ICD-10-CM | POA: Diagnosis present

## 2011-05-18 DIAGNOSIS — I429 Cardiomyopathy, unspecified: Secondary | ICD-10-CM | POA: Diagnosis present

## 2011-05-18 DIAGNOSIS — Z7982 Long term (current) use of aspirin: Secondary | ICD-10-CM

## 2011-05-18 DIAGNOSIS — R109 Unspecified abdominal pain: Secondary | ICD-10-CM | POA: Diagnosis present

## 2011-05-18 DIAGNOSIS — A419 Sepsis, unspecified organism: Secondary | ICD-10-CM

## 2011-05-18 DIAGNOSIS — D638 Anemia in other chronic diseases classified elsewhere: Secondary | ICD-10-CM | POA: Diagnosis present

## 2011-05-18 DIAGNOSIS — Z79899 Other long term (current) drug therapy: Secondary | ICD-10-CM

## 2011-05-18 DIAGNOSIS — D472 Monoclonal gammopathy: Secondary | ICD-10-CM | POA: Diagnosis present

## 2011-05-18 DIAGNOSIS — E46 Unspecified protein-calorie malnutrition: Secondary | ICD-10-CM | POA: Diagnosis present

## 2011-05-18 DIAGNOSIS — I38 Endocarditis, valve unspecified: Secondary | ICD-10-CM | POA: Diagnosis present

## 2011-05-18 DIAGNOSIS — Z681 Body mass index (BMI) 19 or less, adult: Secondary | ICD-10-CM

## 2011-05-18 DIAGNOSIS — I428 Other cardiomyopathies: Secondary | ICD-10-CM | POA: Diagnosis present

## 2011-05-18 DIAGNOSIS — X58XXXA Exposure to other specified factors, initial encounter: Secondary | ICD-10-CM | POA: Diagnosis present

## 2011-05-18 DIAGNOSIS — S51809A Unspecified open wound of unspecified forearm, initial encounter: Secondary | ICD-10-CM | POA: Diagnosis present

## 2011-05-18 DIAGNOSIS — R197 Diarrhea, unspecified: Secondary | ICD-10-CM | POA: Diagnosis present

## 2011-05-18 DIAGNOSIS — B20 Human immunodeficiency virus [HIV] disease: Secondary | ICD-10-CM | POA: Diagnosis present

## 2011-05-18 DIAGNOSIS — J189 Pneumonia, unspecified organism: Secondary | ICD-10-CM

## 2011-05-18 DIAGNOSIS — D696 Thrombocytopenia, unspecified: Secondary | ICD-10-CM | POA: Diagnosis present

## 2011-05-18 DIAGNOSIS — R6521 Severe sepsis with septic shock: Secondary | ICD-10-CM

## 2011-05-18 DIAGNOSIS — A412 Sepsis due to unspecified staphylococcus: Secondary | ICD-10-CM | POA: Diagnosis present

## 2011-05-18 DIAGNOSIS — M549 Dorsalgia, unspecified: Secondary | ICD-10-CM | POA: Diagnosis not present

## 2011-05-18 LAB — LIPASE, BLOOD: Lipase: 12 U/L (ref 11–59)

## 2011-05-18 LAB — DIFFERENTIAL
Basophils Absolute: 0 10*3/uL (ref 0.0–0.1)
Basophils Relative: 1 % (ref 0–1)
Eosinophils Absolute: 0 10*3/uL (ref 0.0–0.7)
Eosinophils Relative: 0 % (ref 0–5)
Lymphocytes Relative: 29 % (ref 12–46)
Lymphs Abs: 1.4 10*3/uL (ref 0.7–4.0)
Monocytes Absolute: 0.9 10*3/uL (ref 0.1–1.0)
Monocytes Relative: 18 % — ABNORMAL HIGH (ref 3–12)
Neutro Abs: 2.5 10*3/uL (ref 1.7–7.7)
Neutrophils Relative %: 52 % (ref 43–77)
WBC Morphology: INCREASED

## 2011-05-18 LAB — COMPREHENSIVE METABOLIC PANEL
ALT: 11 U/L (ref 0–35)
ALT: 10 U/L (ref 0–35)
AST: 46 U/L — ABNORMAL HIGH (ref 0–37)
AST: 36 U/L (ref 0–37)
Albumin: 1.5 g/dL — ABNORMAL LOW (ref 3.5–5.2)
Albumin: 1.2 g/dL — ABNORMAL LOW (ref 3.5–5.2)
Alkaline Phosphatase: 374 U/L — ABNORMAL HIGH (ref 39–117)
Alkaline Phosphatase: 329 U/L — ABNORMAL HIGH (ref 39–117)
BUN: 56 mg/dL — ABNORMAL HIGH (ref 6–23)
BUN: 55 mg/dL — ABNORMAL HIGH (ref 6–23)
CO2: 25 mEq/L (ref 19–32)
CO2: 24 mEq/L (ref 19–32)
Calcium: 8 mg/dL — ABNORMAL LOW (ref 8.4–10.5)
Calcium: 7.4 mg/dL — ABNORMAL LOW (ref 8.4–10.5)
Chloride: 100 mEq/L (ref 96–112)
Chloride: 101 mEq/L (ref 96–112)
Creatinine, Ser: 9.58 mg/dL — ABNORMAL HIGH (ref 0.50–1.10)
Creatinine, Ser: 9.4 mg/dL — ABNORMAL HIGH (ref 0.50–1.10)
GFR calc Af Amer: 5 mL/min — ABNORMAL LOW (ref >90)
GFR calc Af Amer: 5 mL/min — ABNORMAL LOW (ref >90)
GFR calc non Af Amer: 4 mL/min — ABNORMAL LOW (ref >90)
GFR calc non Af Amer: 4 mL/min — ABNORMAL LOW (ref >90)
Glucose, Bld: 105 mg/dL — ABNORMAL HIGH (ref 70–99)
Glucose, Bld: 121 mg/dL — ABNORMAL HIGH (ref 70–99)
Potassium: 3.6 mEq/L (ref 3.5–5.1)
Potassium: 3.3 mEq/L — ABNORMAL LOW (ref 3.5–5.1)
Sodium: 136 mEq/L (ref 135–145)
Sodium: 135 mEq/L (ref 135–145)
Total Bilirubin: 0.8 mg/dL (ref 0.3–1.2)
Total Bilirubin: 0.7 mg/dL (ref 0.3–1.2)
Total Protein: 9 g/dL — ABNORMAL HIGH (ref 6.0–8.3)
Total Protein: 7.5 g/dL (ref 6.0–8.3)

## 2011-05-18 LAB — CORTISOL: Cortisol, Plasma: 13.7 ug/dL

## 2011-05-18 LAB — CBC
HCT: 29.7 % — ABNORMAL LOW (ref 36.0–46.0)
HCT: 24.7 % — ABNORMAL LOW (ref 36.0–46.0)
Hemoglobin: 9.9 g/dL — ABNORMAL LOW (ref 12.0–15.0)
Hemoglobin: 8.1 g/dL — ABNORMAL LOW (ref 12.0–15.0)
MCH: 30.6 pg (ref 26.0–34.0)
MCH: 30 pg (ref 26.0–34.0)
MCHC: 33.3 g/dL (ref 30.0–36.0)
MCHC: 32.8 g/dL (ref 30.0–36.0)
MCV: 91.7 fL (ref 78.0–100.0)
MCV: 91.5 fL (ref 78.0–100.0)
Platelets: 118 10*3/uL — ABNORMAL LOW (ref 150–400)
Platelets: 94 10*3/uL — ABNORMAL LOW (ref 150–400)
RBC: 3.24 MIL/uL — ABNORMAL LOW (ref 3.87–5.11)
RBC: 2.7 MIL/uL — ABNORMAL LOW (ref 3.87–5.11)
RDW: 20.5 % — ABNORMAL HIGH (ref 11.5–15.5)
RDW: 20.1 % — ABNORMAL HIGH (ref 11.5–15.5)
WBC: 4.8 10*3/uL (ref 4.0–10.5)
WBC: 4.6 10*3/uL (ref 4.0–10.5)

## 2011-05-18 LAB — POCT I-STAT, CHEM 8
BUN: 62 mg/dL — ABNORMAL HIGH (ref 6–23)
Calcium, Ion: 1.05 mmol/L — ABNORMAL LOW (ref 1.12–1.32)
Chloride: 107 mEq/L (ref 96–112)
Creatinine, Ser: 9.3 mg/dL — ABNORMAL HIGH (ref 0.50–1.10)
Glucose, Bld: 106 mg/dL — ABNORMAL HIGH (ref 70–99)
HCT: 34 % — ABNORMAL LOW (ref 36.0–46.0)
Hemoglobin: 11.6 g/dL — ABNORMAL LOW (ref 12.0–15.0)
Potassium: 3.7 mEq/L (ref 3.5–5.1)
Sodium: 142 mEq/L (ref 135–145)
TCO2: 27 mmol/L (ref 0–100)

## 2011-05-18 LAB — CARBOXYHEMOGLOBIN
Carboxyhemoglobin: 2.3 % — ABNORMAL HIGH (ref 0.5–1.5)
Methemoglobin: 1 % (ref 0.0–1.5)
O2 Saturation: 61.2 %
Total hemoglobin: 7.8 g/dL — ABNORMAL LOW (ref 12.5–16.0)

## 2011-05-18 LAB — LACTIC ACID, PLASMA
Lactic Acid, Venous: 3.3 mmol/L — ABNORMAL HIGH (ref 0.5–2.2)
Lactic Acid, Venous: 3.1 mmol/L — ABNORMAL HIGH (ref 0.5–2.2)

## 2011-05-18 LAB — TYPE AND SCREEN
ABO/RH(D): O POS
Antibody Screen: NEGATIVE

## 2011-05-18 LAB — CARDIAC PANEL(CRET KIN+CKTOT+MB+TROPI)
CK, MB: 3.7 ng/mL (ref 0.3–4.0)
Relative Index: 3.2 — ABNORMAL HIGH (ref 0.0–2.5)
Total CK: 114 U/L (ref 7–177)
Troponin I: <0.3 ng/mL (ref <0.30)

## 2011-05-18 LAB — GLUCOSE, CAPILLARY: Glucose-Capillary: 103 mg/dL — ABNORMAL HIGH (ref 70–99)

## 2011-05-18 LAB — PROTIME-INR
INR: 1.6 — ABNORMAL HIGH (ref 0.00–1.49)
Prothrombin Time: 19.3 seconds — ABNORMAL HIGH (ref 11.6–15.2)

## 2011-05-18 LAB — MRSA PCR SCREENING: MRSA by PCR: NEGATIVE

## 2011-05-18 LAB — APTT: aPTT: 47 seconds — ABNORMAL HIGH (ref 24–37)

## 2011-05-18 LAB — PROCALCITONIN: Procalcitonin: 30.89 ng/mL

## 2011-05-18 LAB — FIBRINOGEN: Fibrinogen: 136 mg/dL — ABNORMAL LOW (ref 204–475)

## 2011-05-18 MED ORDER — NEPRO/CARBSTEADY PO LIQD
237.0000 mL | ORAL | Status: DC | PRN
  Filled 2011-05-18: qty 237

## 2011-05-18 MED ORDER — DARBEPOETIN ALFA-POLYSORBATE 150 MCG/0.3ML IJ SOLN: 150.0000 ug | INTRAMUSCULAR | Status: DC

## 2011-05-18 MED ORDER — PIPERACILLIN-TAZOBACTAM 3.375 G IVPB
3.3750 g | Freq: Once | INTRAVENOUS | Status: AC
  Administered 2011-05-18: 3.375 g via INTRAVENOUS
  Filled 2011-05-18: qty 50

## 2011-05-18 MED ORDER — SODIUM CHLORIDE 0.9 % IV BOLUS (SEPSIS)
250.0000 mL | Freq: Once | INTRAVENOUS | Status: AC
  Administered 2011-05-18: 15:00:00 via INTRAVENOUS

## 2011-05-18 MED ORDER — HEPARIN SODIUM (PORCINE) 1000 UNIT/ML DIALYSIS
100.0000 [IU]/kg | INTRAMUSCULAR | Status: DC | PRN
  Administered 2011-05-19: 5600 [IU] via INTRAVENOUS_CENTRAL
  Administered 2011-05-20: 6400 [IU] via INTRAVENOUS_CENTRAL
  Filled 2011-05-18: qty 7

## 2011-05-18 MED ORDER — STAVUDINE 20 MG PO CAPS
20.0000 mg | ORAL_CAPSULE | Freq: Every day | ORAL | Status: DC
  Administered 2011-05-19 – 2011-05-30 (×11): 20 mg via ORAL
  Filled 2011-05-18 (×13): qty 1

## 2011-05-18 MED ORDER — ACETAMINOPHEN 325 MG PO TABS
650.0000 mg | ORAL_TABLET | Freq: Once | ORAL | Status: AC
  Administered 2011-05-18: 650 mg via ORAL
  Filled 2011-05-18: qty 2

## 2011-05-18 MED ORDER — PIPERACILLIN-TAZOBACTAM IN DEX 2-0.25 GM/50ML IV SOLN
2.2500 g | Freq: Three times a day (TID) | INTRAVENOUS | Status: DC
  Administered 2011-05-18 – 2011-05-21 (×8): 2.25 g via INTRAVENOUS
  Filled 2011-05-18 (×13): qty 50

## 2011-05-18 MED ORDER — LAMIVUDINE 10 MG/ML PO SOLN
25.0000 mg | Freq: Every day | ORAL | Status: DC
  Administered 2011-05-19 – 2011-05-30 (×11): 25 mg via ORAL
  Filled 2011-05-18 (×12): qty 5

## 2011-05-18 MED ORDER — LIDOCAINE HCL (PF) 1 % IJ SOLN: 5.0000 mL | INTRAMUSCULAR | Status: DC | PRN

## 2011-05-18 MED ORDER — ACETAMINOPHEN 325 MG PO TABS
650.0000 mg | ORAL_TABLET | Freq: Three times a day (TID) | ORAL | Status: DC | PRN
Start: 2011-05-18 — End: 2011-05-30
  Administered 2011-05-26 – 2011-05-29 (×2): 650 mg via ORAL
  Filled 2011-05-18 (×2): qty 2

## 2011-05-18 MED ORDER — NEPRO/CARBSTEADY PO LIQD
237.0000 mL | Freq: Two times a day (BID) | ORAL | Status: DC
  Administered 2011-05-19 – 2011-05-24 (×4): 237 mL via ORAL
  Filled 2011-05-18 (×18): qty 237

## 2011-05-18 MED ORDER — ASPIRIN 81 MG PO CHEW
81.0000 mg | CHEWABLE_TABLET | Freq: Every day | ORAL | Status: DC
  Administered 2011-05-19 – 2011-05-30 (×11): 81 mg via ORAL
  Filled 2011-05-18 (×11): qty 1

## 2011-05-18 MED ORDER — SODIUM CHLORIDE 0.9 % IV BOLUS (SEPSIS): 25.0000 mL/kg | Freq: Once | INTRAVENOUS | Status: DC

## 2011-05-18 MED ORDER — DIPHENHYDRAMINE HCL 25 MG PO CAPS
25.0000 mg | ORAL_CAPSULE | ORAL | Status: DC | PRN
  Administered 2011-05-18 – 2011-05-25 (×4): 25 mg via ORAL
  Filled 2011-05-18 (×3): qty 1

## 2011-05-18 MED ORDER — VANCOMYCIN HCL 1000 MG IV SOLR
750.0000 mg | Freq: Once | INTRAVENOUS | Status: AC
  Administered 2011-05-19: 750 mg via INTRAVENOUS
  Filled 2011-05-18 (×2): qty 750

## 2011-05-18 MED ORDER — SODIUM CHLORIDE 0.9 % IV BOLUS (SEPSIS)
250.0000 mL | Freq: Once | INTRAVENOUS | Status: AC
  Administered 2011-05-18: 250 mL via INTRAVENOUS

## 2011-05-18 MED ORDER — ALTEPLASE 2 MG IJ SOLR: 2.0000 mg | Freq: Once | INTRAMUSCULAR | Status: AC | PRN

## 2011-05-18 MED ORDER — SODIUM CHLORIDE 0.9 % IV SOLN
INTRAVENOUS | Status: DC
  Administered 2011-05-21 – 2011-05-22 (×2): via INTRAVENOUS

## 2011-05-18 MED ORDER — SODIUM CHLORIDE 0.9 % IJ SOLN: 3.0000 mL | INTRAMUSCULAR | Status: DC | PRN

## 2011-05-18 MED ORDER — ONDANSETRON HCL 4 MG PO TABS: 4.0000 mg | ORAL_TABLET | Freq: Four times a day (QID) | ORAL | Status: DC | PRN

## 2011-05-18 MED ORDER — PENTAFLUOROPROP-TETRAFLUOROETH EX AERO: 1.0000 "application " | INHALATION_SPRAY | CUTANEOUS | Status: DC | PRN

## 2011-05-18 MED ORDER — SODIUM CHLORIDE 0.9 % IV SOLN: 100.0000 mL | INTRAVENOUS | Status: DC | PRN

## 2011-05-18 MED ORDER — SODIUM CHLORIDE 0.9 % IV SOLN
Freq: Once | INTRAVENOUS | Status: AC
  Administered 2011-05-18: 13:00:00 via INTRAVENOUS

## 2011-05-18 MED ORDER — VANCOMYCIN HCL IN DEXTROSE 1-5 GM/200ML-% IV SOLN
1000.0000 mg | Freq: Once | INTRAVENOUS | Status: AC
  Administered 2011-05-18: 1000 mg via INTRAVENOUS
  Filled 2011-05-18: qty 200

## 2011-05-18 MED ORDER — SODIUM CHLORIDE 0.9 % IV SOLN: 250.0000 mL | INTRAVENOUS | Status: DC | PRN

## 2011-05-18 MED ORDER — SULFAMETHOXAZOLE-TMP DS 800-160 MG PO TABS
1.0000 | ORAL_TABLET | ORAL | Status: DC
Start: 2011-05-19 — End: 2011-05-19
  Filled 2011-05-18: qty 1

## 2011-05-18 MED ORDER — SULFAMETHOXAZOLE-TRIMETHOPRIM 800-160 MG PO TABS: 1.0000 | ORAL_TABLET | Freq: Every day | ORAL | Status: DC

## 2011-05-18 MED ORDER — ACETAMINOPHEN ER 650 MG PO TBCR: 650.0000 mg | EXTENDED_RELEASE_TABLET | Freq: Three times a day (TID) | ORAL | Status: DC | PRN

## 2011-05-18 MED ORDER — SODIUM CHLORIDE 0.9 % IJ SOLN
3.0000 mL | Freq: Two times a day (BID) | INTRAMUSCULAR | Status: DC
  Administered 2011-05-18 – 2011-05-24 (×8): 3 mL via INTRAVENOUS

## 2011-05-18 MED ORDER — ASPIRIN 81 MG PO TABS
81.0000 mg | ORAL_TABLET | Freq: Every day | ORAL | Status: DC
Start: 2011-05-18 — End: 2011-05-18

## 2011-05-18 MED ORDER — FAMOTIDINE 20 MG PO TABS
20.0000 mg | ORAL_TABLET | Freq: Every day | ORAL | Status: DC
  Administered 2011-05-19 – 2011-05-30 (×11): 20 mg via ORAL
  Filled 2011-05-18 (×12): qty 1

## 2011-05-18 MED ORDER — DARUNAVIR ETHANOLATE 800 MG PO TABS
800.0000 mg | ORAL_TABLET | Freq: Every day | ORAL | Status: DC
  Administered 2011-05-19 – 2011-05-30 (×12): 800 mg via ORAL
  Filled 2011-05-18 (×17): qty 1

## 2011-05-18 MED ORDER — CHLORHEXIDINE GLUCONATE 0.12 % MT SOLN
15.0000 mL | Freq: Two times a day (BID) | OROMUCOSAL | Status: DC
  Administered 2011-05-18 – 2011-05-20 (×3): 15 mL via OROMUCOSAL
  Filled 2011-05-18 (×3): qty 15

## 2011-05-18 MED ORDER — LIDOCAINE-PRILOCAINE 2.5-2.5 % EX CREA
1.0000 "application " | TOPICAL_CREAM | CUTANEOUS | Status: DC | PRN
  Filled 2011-05-18: qty 5

## 2011-05-18 MED ORDER — STAVUDINE 1 MG/ML PO SOLR: 20.0000 mL | Freq: Every day | ORAL | Status: DC

## 2011-05-18 MED ORDER — RITONAVIR 100 MG PO TABS
100.0000 mg | ORAL_TABLET | Freq: Every day | ORAL | Status: DC
  Administered 2011-05-19 – 2011-05-30 (×12): 100 mg via ORAL
  Filled 2011-05-18 (×19): qty 1

## 2011-05-18 MED ORDER — SODIUM CHLORIDE 0.9 % IV SOLN
500.0000 mg | INTRAVENOUS | Status: AC
  Administered 2011-05-18: 500 mg via INTRAVENOUS
  Filled 2011-05-18 (×2): qty 500

## 2011-05-18 MED ORDER — BIOTENE DRY MOUTH MT LIQD
15.0000 mL | Freq: Two times a day (BID) | OROMUCOSAL | Status: DC
  Administered 2011-05-18 – 2011-05-19 (×3): 15 mL via OROMUCOSAL

## 2011-05-18 MED ORDER — SODIUM CHLORIDE 0.9 % IJ SOLN
3.0000 mL | Freq: Two times a day (BID) | INTRAMUSCULAR | Status: DC
  Administered 2011-05-19 – 2011-05-24 (×10): 3 mL via INTRAVENOUS
  Administered 2011-05-25: 10:00:00 via INTRAVENOUS
  Administered 2011-05-26 – 2011-05-30 (×9): 3 mL via INTRAVENOUS

## 2011-05-18 MED ORDER — HEPARIN SODIUM (PORCINE) 5000 UNIT/ML IJ SOLN
5000.0000 [IU] | Freq: Three times a day (TID) | INTRAMUSCULAR | Status: DC
  Administered 2011-05-18 – 2011-05-30 (×34): 5000 [IU] via SUBCUTANEOUS
  Filled 2011-05-18 (×39): qty 1

## 2011-05-18 MED ORDER — ONDANSETRON HCL 4 MG/2ML IJ SOLN
4.0000 mg | Freq: Four times a day (QID) | INTRAMUSCULAR | Status: DC | PRN
  Administered 2011-05-18: 4 mg via INTRAVENOUS
  Filled 2011-05-18: qty 2

## 2011-05-18 MED ORDER — RENA-VITE PO TABS
1.0000 | ORAL_TABLET | Freq: Every day | ORAL | Status: DC
  Administered 2011-05-19 – 2011-05-29 (×11): 1 via ORAL
  Filled 2011-05-18 (×13): qty 1

## 2011-05-18 MED ORDER — HEPARIN SODIUM (PORCINE) 1000 UNIT/ML DIALYSIS
1000.0000 [IU] | INTRAMUSCULAR | Status: DC | PRN
  Filled 2011-05-18: qty 1

## 2011-05-18 NOTE — H&P (Signed)
Hospital Admission Note Date: 05/18/2011  Patient name: [name] Medical record number: [id number] Date of birth: 1955-05-23 Age: 56 y.o. Gender: female PCP: Janell Quiet, MD, MD  Medical Service: [facility] teaching service  Attending physician: Dr. Lars Mage    1st Contact: Dr. Nicoletta Dress    Pager: [phone] 2nd Contact: Dr. Posey Pronto    Pager: [phone]  After 5 pm or weekends: 1st Contact:      Pager: [phone] 2nd Contact:      Pager: [phone]  Chief Complaint: Fever and cough  History of Present Illness: This is 56 year old woman with PMH of HIV infection (last CD4 220, VL [id number] on 02/19/2011), ESRD with HD, HTN, C-diff and VRE in Urine in 2012, sepsis in Dec, 2012), who presents with fever and cough. Patient states that she started to have intermittent nonproductive cough a month ago.  She felt hot and chills at times, however, she's not sure whether she had fever or not because she didn't take her temperatures. She denies any associated cold-like symptoms. Denies wheezing or shortness breath.  Denies chest pain, chest pressure or palpitation.  About one week ago, Patient started to have intermittent nausea, diarrhea and lower abdomen pain.  Denies vomiting.  Her diarrhea is watery loose stools 4 times a day without bloody stools. She also reports decreased appetite and generalized fatigue and weakness. And she missed two sessions of HD on this Thursday and Saturday last week due to weakness. She states that she had no energy to go to her HD. Patient is here for further evaluation. She was noted to be hypotensive with BP 80's/40's .   Of note, she was hospitalized in Dec, 2012 for sepsis and septic shock. She was noted to have MSSA bacteremia 2/2 her infected line graft site. She did have RLL PNA on admission. She was discharged with Ancef IV with HDs for 6 weeks.   Medication List  As of 05/18/2011  5:53 PM   ASK your doctor about these medications         acetaminophen 650 MG  CR tablet   Commonly known as: TYLENOL   Take 650 mg by mouth every 8 (eight) hours as needed. For pain.      ARANESP (ALBUMIN FREE) 150 MCG/0.3ML Soln   Generic drug: darbepoetin   Inject 150 mcg into the skin. Use weekly for Epogen 20000 units. Dialysis nurse to administer.      aspirin 81 MG tablet   Take 81 mg by mouth daily.      b complex vitamins tablet   Take 1 tablet by mouth daily.      b complex-vitamin c-folic acid 0.8 MG Tabs   Take 0.8 mg by mouth daily.      BACTRIM DS 800-160 MG per tablet   Generic drug: sulfamethoxazole-trimethoprim   Take 1 tablet by mouth daily. Take 1 tablet by mouth every Monday, Wednesday and Friday.      darunavir 400 MG tablet   Commonly known as: PREZISTA   Take 2 tablets (800 mg total) by mouth daily with breakfast.      ENSURE   Take 237 mLs by mouth 3 (three) times daily between meals.      famotidine 20 MG tablet   Commonly known as: PEPCID   Take 20 mg by mouth daily.      lamiVUDine 10 MG/ML solution   Commonly known as: EPIVIR   Take 1 teaspoon by mouth every day.  NORVIR 100 MG Tabs   Generic drug: ritonavir   Take 100 mg by mouth daily with breakfast.      VENOFER 20 MG/ML injection   Generic drug: iron sucrose   100 mg IV on Tuesday HD(HD RN to dispense)      ZEMPLAR 5 MCG/ML injection   Generic drug: paricalcitol   4 micrograms IV- dispensed by dialysis nurse.      ZERIT 1 MG/ML Solr   Generic drug: Stavudine   Take 4 teaspoons(20 ml) by mouth every day.            Allergies: Allergies as of 05/18/2011  . (No Known Allergies)   Past Medical History  Diagnosis Date  . Human immunodeficiency virus (HIV) disease   . Hypertension   . ESRD (end stage renal disease)   . Dialysis patient     pt on dialysis since 2010.  [name] Clostridium difficile infection 04/04/10  . Bacteriuria, asymptomatic 04/04/10    Culture grew VRE sensitive to linesolid   . MGUS (monoclonal gammopathy of unknown significance)     . History of bacteremia     MSSA  . Renal insufficiency   . Anemia    Past Surgical History  Procedure Date  . Av fistula placement   . Insertion of dialysis catheter 12/30/2010    Procedure: INSERTION OF DIALYSIS CATHETER;  Surgeon: Elam Dutch, MD;  Location: [place];  Service: Vascular;  Laterality: Left;  Insertion of left internal jugular dialysis catheter  . Vascular surgery    Family History  Problem Relation Age of Onset  . Alcohol abuse      family h/o addiction/alcoholism  . Diabetes Cousin     first degree relatives  . Kidney disease Mother   . Kidney disease Maternal Uncle   . Cancer Sister    History   Social History  . Marital Status: Single    Spouse Name: N/A    Number of Children: N/A  . Years of Education: N/A   Occupational History  . Not on file.   Social History Main Topics  . Smoking status: Never Smoker   . Smokeless tobacco: Never Used  . Alcohol Use: No  . Drug Use: No  . Sexually Active: Not on file   Other Topics Concern  . Not on file   Social History Narrative   Lives with daughter(does not know dx). Oldest daughter does not dx.Current partner of may years,? fidelity    Review of Systems: Review of Systems:  Constitutional:  Positive for fever, chills, appetite change and fatigue.  Positive for generalized weakness   HEENT:  Denies congestion, sore throat, rhinorrhea, sneezing, mouth sores, trouble swallowing, neck pain   Respiratory:   positive SOB, cough.  Denies wheezing.   Cardiovascular:  Denies palpitations and leg swelling.   Gastrointestinal:   positive nausea, abdominal pain, diarrhea.  Denies vomiting, constipation, blood in stool and abdominal distention.   Genitourinary:   does not produce urine anymore.  Positive suprapubic discomfort   Musculoskeletal:  Denies myalgias, back pain, joint swelling, arthralgias and gait problem.   Skin:  Denies pallor, rash and wound.   Neurological:  Denies dizziness, seizures,  syncope, weakness, light-headedness, numbness and headaches.    .    Physical Exam: Blood pressure 90/54, pulse 90, temperature 99.5 F (37.5 C), temperature source Oral, resp. rate 16, weight 141 lb 15.6 oz (64.4 kg), SpO2 100.00%. General: dishevel and unkempt. Cathectic, chronic ill appearing.  Head: normocephalic  and atraumatic.  Eyes: vision grossly intact, pupils equal, pupils round, pupils reactive to light, no injection and anicteric.  Mouth: pharynx pink and moist, no erythema, and no exudates.  No petechiae is noted on her conjunctiva and mucous membranes.  Neck: supple, full ROM, no thyromegaly, no JVD, and no carotid bruits.  Lungs: normal respiratory effort, no accessory muscle use, bilateral lung sounds diminished. no crackles, and no wheezes. Left chest HD cathter is very dirty and dry food particles noted on the tubing.  Heart: normal rate, regular rhythm, no gallop, and no rub. 3/6 systolic murmur noted on apex area. 3/6 systolic murmur noted on left sternum. 2/6 systolic murmur noted on aortic valve area. PMI pulsatile movement noted. Heart Lift/heave noted on PMI Abdomen: soft, tenderness to palpation on suprapubic area, normal bowel sounds, no distention, no guarding, no rebound tenderness, no hepatomegaly, and no splenomegaly.  Msk: no joint swelling, no joint warmth, and no redness over joints.  Mild tenderness to palpation on bilateral plantar side of the feet. Pulses: 1+ DP/PT pulses bilaterally Extremities: No cyanosis, clubbing, edema.  Neurologic: alert & oriented X3, cranial nerves II-XII intact, strength normal in all extremities, sensation intact to light touch, and gait normal.  Skin: Dry skin turgor and no rashes.  No splinter hemorrhage, Janeway lesions or Osler nodes noted.   Scattered skin abrasions on her back. Right FA a healing ulcer noted. Psych: Oriented X3, memory intact for recent and remote, normally interactive, good eye contact, not anxious  appearing, and not depressed appearing.    Lab results: Basic Metabolic Panel:  Basename 05/18/11 1321 05/18/11 1259  Kimmie Doren 142 136  K 3.7 3.6  CL 107 100  CO2 -- 25  GLUCOSE 106* 105*  BUN 62* 56*  CREATININE 9.30* 9.58*  CALCIUM -- 8.0*  MG -- --  PHOS -- --   Liver Function Tests:  Basename 05/18/11 1259  AST 46*  ALT 11  ALKPHOS 374*  BILITOT 0.8  PROT 9.0*  ALBUMIN 1.5*    Basename 05/18/11 1259  LIPASE 12  AMYLASE --   CBC:  Basename 05/18/11 1708 05/18/11 1321 05/18/11 1259  WBC 4.6 -- 4.8  NEUTROABS -- -- 2.5  HGB 8.1* 11.6* --  HCT 24.7* 34.0* --  MCV 91.5 -- 91.7  PLT 94* -- 118*   CBG:  Basename 05/18/11 1745  GLUCAP 103*   Imaging results:  Dg Chest Port 1 View  05/18/2011  *RADIOLOGY REPORT*  Clinical Data: Stomach pain  PORTABLE CHEST - 1 VIEW  Comparison: 12/30/2010  Findings: Cardiomegaly again noted.  Left IJ double lumen dialysis catheter is unchanged in position.  There is bilateral perihilar airspace disease airspace disease right greater than left.  This is suspicious for pneumonia rather than asymmetric pulmonary edema. Clinical correlation is necessary.  IMPRESSION: Bilateral perihilar airspace disease right greater than left suspicious for pneumonia rather than pulmonary edema.  Clinical correlation is necessary.  Cardiomegaly again noted. Follow-up examination is recommended to assure resolution.  Original Report Authenticated By: Lahoma Crocker, M.D.    Assessment & Plan by Problem:  #. Severe Sepsis     The patient is noted to be hypotensive with fever, tachycardia and tachypnea in the setting of infectious process including pneumonia, questionable left chest HD line infection, and possible endocarditis.  Her lactic acid is elevated to be 3.3.  For pneumonia, her symptoms and chest x-ray suggested right lung pneumonia. With her poorly controlled HIV infection, we can not rule out PCP.  For left chest HD line infection, her line appears  very dirty, which could an source of infection.  For endocarditis, her last Echo in September, 2012 showed normal MV, TV, No TR, Mild AS. And there was no murmur noted per D/C summary from Dec, 2012. We did note systolic murmurs see above physical exam. It is unclear whether she has had heart murmur since her last discharge in Dec, 2012. If her murmurs are new for this admission, the possibility of endocarditis is increased.     - She has responded to fluid challenge with 1 L of IVF - If her blood pressure continues to drop, she may need CVP line for monitoring her fluid status. - PCCM is consulted - continue Vanco and Zosyn -  will add Bactrim IV at 5mg /kg/hour daily for PCP Tx. She will need the treatment for 21 days - will obtain ABG.  - follow up with her lactic acid. - May consider TEE pending Blood culture.  # HCAP vs PCP PNA    Patient presented with one month history of cough and feeling hot and chills.  She was noted to have fever of 102.4 on admission.  Her bilateral lung sounds diminished with no rales or wheezing noted.  Her chest x-ray showed right side opacity likely pneumonia.  Given the fact that she is ESRD with HD patient, we will treat her for HCAP.  Other differential diagnosis PE is unlikely as she has no s/s of DVT and her Geneva score shows low probability.  - Blood culture x 4 ( for work up of endocarditis) - crypto antigen pending - start Vancomycin, Zosyn and bactrim see above note - symptomatic treatment for cough.  # Uremia syndrome Patient presented with nausea, abdomen pain and generalized with weakness for one week which correlated with Her missing 2 sessions of hemodialysis.  Her BUN is 56, which is significantly elevated comparing to her BUN of 12 in February of 2013.  - will contact Renal - will defer HD to Renal team  # Diarrhea:   The etiology is unclear.  DDX include Gastroenteritis vs C-diff infection   - will chect C-diff PCR .  # Suprapubic  pain  patient does not produce urine, thus she has no urinary symptoms. 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  - UA and Urine culture by I+O cath to rule out UTI.  # ESRD with HD    She has had a multiple sites infections upper grafts and access in the past.  The vascular surgeons have stated that she has no further options for upper extremity dialysis access. She has had a left IJ Diatek cath placed by Dr. Oneida Alar on 12 /24/12.   She has missed 2 sessions of her dialysis this week due to generalized weakness and fatigue.  Her potassium is 3.6, CO2 25, and CR 9.58 on admission.  - Will consult renal - will defer HD to Renal team.  # HIV infection    This is a chronic problem.  She does have a poor compliance with her medications.  Her last CD4 220 and VL [id number] on 02/19/11.   - will continue her home medications - CD4 and VL pedning  #Thrombocytopenia    Chronic stable. Likely due to end-stage renal disease.  # Anemia of chronic disease, baseline 8-9    stable  # Mild Hypertension    Baseline SBP ranges at 140's.  Not on any medical treatment.   # Malnutrition,  likely due to renal disease    Albumin 1.5 on this admission  - Oral supplement -Nutritionist consult  # VTE: Heparin  Signed: Townsend Cudworth 05/18/2011, 5:50 PM

## 2011-05-18 NOTE — Consult Note (Signed)
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 CXR showed bilateral ASPDZ  R>L  Given 1L bolus in ER with b/p response . Missed 2 HD sessions  PCCM asked to consult .   Past Medical History  Diagnosis Date  . Human immunodeficiency virus (HIV) disease   . Hypertension   . ESRD (end stage renal disease)   . Dialysis patient     pt on dialysis since 2010.  [name] Clostridium difficile infection 04/04/10  . Bacteriuria, asymptomatic 04/04/10    Culture grew VRE sensitive to linesolid   . MGUS (monoclonal gammopathy of unknown significance)   . History of bacteremia     MSSA  . Renal insufficiency   . Anemia    Past Surgical History  Procedure Date  . Av fistula placement   . Insertion of dialysis catheter 12/30/2010    Procedure: INSERTION OF DIALYSIS CATHETER;  Surgeon: Elam Dutch, MD;  Location: [place];  Service: Vascular;  Laterality: Left;  Insertion of left internal jugular dialysis catheter  . Vascular surgery    Prior to Admission medications   Medication Sig Start Date End Date Taking? Authorizing Provider  acetaminophen (TYLENOL) 650 MG CR tablet Take 650 mg by mouth every 8 (eight) hours as needed. For pain.   Yes Historical Provider, MD  aspirin 81 MG tablet Take 81 mg by mouth daily.     Yes Historical Provider, MD  b complex vitamins tablet Take 1 tablet by mouth daily.     Yes Historical Provider, MD  b complex-vitamin c-folic acid (NEPHRO-VITE) 0.8 MG TABS Take 0.8 mg by mouth daily.     Yes Historical Provider, MD  darbepoetin (ARANESP, ALBUMIN FREE,) 150 MCG/0.3ML SOLN Inject 150 mcg into the skin. Use weekly for Epogen 20000 units. Dialysis nurse to administer.    Yes Historical Provider, MD  darunavir (PREZISTA) 400 MG tablet Take 2 tablets (800 mg total) by mouth daily with  breakfast. 12/31/10 12/31/11 Yes Olga Millers, MD  ENSURE (ENSURE) Take 237 mLs by mouth 3 (three) times daily between meals.     Yes Historical Provider, MD  famotidine (PEPCID) 20 MG tablet Take 20 mg by mouth daily.     Yes Historical Provider, MD  iron sucrose (VENOFER) 20 MG/ML injection 100 mg IV on Tuesday HD(HD RN to dispense)    Yes Historical Provider, MD  lamiVUDine (EPIVIR) 10 MG/ML solution Take 1 teaspoon by mouth every day.    Yes Historical Provider, MD  paricalcitol (ZEMPLAR) 5 MCG/ML injection 4 micrograms IV- dispensed by dialysis nurse.    Yes Historical Provider, MD  ritonavir (NORVIR) 100 MG TABS Take 100 mg by mouth daily with breakfast.    Yes Historical Provider, MD  Stavudine (ZERIT) 1 MG/ML SOLR Take 4 teaspoons(20 ml) by mouth every day.    Yes Historical Provider, MD  sulfamethoxazole-trimethoprim (BACTRIM DS) 800-160 MG per tablet Take 1 tablet by mouth daily. Take 1 tablet by mouth every Monday, Wednesday and Friday.   Yes Historical Provider, MD   Allergies No Known Allergies  Family History Family History  Problem Relation Age of Onset  . Alcohol abuse      family h/o addiction/alcoholism  . Diabetes Cousin     first degree relatives  . Kidney disease Mother   .  Kidney disease Maternal Uncle   . Cancer Sister    Social History  reports that she has never smoked. She has never used smokeless tobacco. She reports that she does not drink alcohol or use illicit drugs.  Review Of Systems:   Constitutional:   No  weight loss,  +night sweats,  Fevers, chills, fatigue, or  lassitude.  HEENT:   No headaches,  Difficulty swallowing,  Tooth/dental problems, or  Sore throat,                No sneezing, itching, ear ache, nasal congestion, post nasal drip,   CV:  No chest pain,  Orthopnea, PND, swelling in lower extremities, anasarca, dizziness, palpitations, syncope.   GI  No heartburn, indigestion, abdominal pain,  +nausea, vomiting, diarrhea  No  bloody stools.   Resp:      No coughing up of blood.   + cough and dyspnea   Skin: no rash or lesions.  GU: no dysuria, change in color of urine, no urgency or frequency.  No flank pain, no hematuria   MS:  No joint pain or swelling.  No decreased range of motion.  No back pain.  Psych:  No change in mood or affect. No depression or anxiety.  No memory loss.       Brief patient description:  Events Since Admission:      Vital Signs: Temp:  [99.5 F (37.5 C)-102.4 F (39.1 C)] 99.5 F (37.5 C) (05/12 1624) Pulse Rate:  [94-102] 94  (05/12 1624) Resp:  [18] 18  (05/12 1624) BP: (80-97)/(40-57) 92/51 mmHg (05/12 1624) SpO2:  [96 %-99 %] 99 % (05/12 1624)  Physical Examination: General:   Weak, alert  Neuro: alert Ox3  HEENT:  Edentulous, dry mucosa  Neck:  Supple, no JVD   Cardiovascular:  RRR, gr 1-2 SM , tr edema  Lungs:  Coarse BS  Abdomen:  Soft, nontender, nondistended, bowel sounds present Musculoskeletal:  Moves all extremities, no pedal edema Skin:  No rash     ASSESSMENT AND PLAN  PULMONARY     CXR:  Bilateral perihilar airspace disease right greater than left suspicious for pneumonia rather than pulmonary edema. Clinical correlation is necessary. Cardiomegaly again noted   A: HCAP in pt with underlying HIV (note last adm in dec'12) P:  Cover with Vanc and Zosyn  pumlonary hygiene   CARDIOVASCULAR  Lab 05/18/11 1309  TROPONINI --  LATICACIDVEN 3.3*  PROBNP --   ECG:  NSR  Lines:   A: Septic shock P: Fluid challenge w/ 1 L - seemingly has responded, will rechk lactate for clearance If Bp drops further , can give another L of fluids in 500 cc increments, otherwise may need CVL  RENAL  Lab 05/18/11 1321 05/18/11 1259  NA 142 136  K 3.7 3.6  CL 107 100  CO2 -- 25  BUN 62* 56*  CREATININE 9.30* 9.58*  CALCIUM -- 8.0*  MG -- --  PHOS -- --   Intake/Output    None    Foley:    A:  ESRD -missed 2 sessions prior to admission  P:   Renal consult - not called   GASTROINTESTINAL  Lab 05/18/11 1259  AST 46*  ALT 11  ALKPHOS 374*  BILITOT 0.8  PROT 9.0*  ALBUMIN 1.5*    A:  Gastritis ? Etiology  P:  Cont to monitor   zofran As needed    HEMATOLOGIC  Lab 05/18/11 1321 05/18/11 1259  HGB 11.6* 9.9*  HCT 34.0* 29.7*  PLT -- 118*  INR -- --  APTT -- --   A:  Anemia  P:  Cont to monitor   INFECTIOUS  Lab 05/18/11 1259  WBC 4.8  PROCALCITON --   Cultures: 5/12 BCx 2>>  5/12 UC >>  Antibiotics: 5/12 Vanc >> 5/12 Zosyn >>   A:  Likley source HCAP , other sources include permacath & diarrhea        HIV -last CD4 220 (02/19/11)   P:  Zosyn and Vanc  Follow pan cx     BEST PRACTICE / DISPOSITION - Level of Care:  ICU with PCCM as consult - Primary Service:  IM teaching service  - Consultants:   - Code Status:   - Diet:   - DVT Px:   - GI Px:   - Skin Integrity:   - Social / Family:    Rexene Edison, M.D. Pulmonary [facility] Pager: [phone]  05/18/2011, 4:30 PM  Independently examined pt, evaluated data & formulated above care plan with NP [facility] V.

## 2011-05-18 NOTE — ED Notes (Signed)
PCCM at the bedside. Attempting to decide bed placement and discussing code sepsis protocol. Pt resting quietly at the time. Remains on cardiac monitor. Denies pain. Will continue to monitor closely.

## 2011-05-18 NOTE — ED Notes (Signed)
Abdominal pain since Tuesday, generalized pain with n/v. Pt. Has not gone to dialysis since Tuesday. Pt. Appears very weak, also has a skin tear to above her rt. Elbow.  Pt.'s daughter reports that her bottom of her feet also are sore.

## 2011-05-18 NOTE — ED Notes (Addendum)
Patient's daughter states the patient is having abdominal pain with weakness x 4 days. Patient was to weak to go to dialysis on Thursday. Patient has dialysis treatments on Tuesday, Thursday and Saturdays. Last dialysis was on Tuesday. Patient c/o pain in abdomen and bottom of her feet and non productive cough and weakness. Patient denies N/V/D/F at this time. Patient does not produce urine.   Patient dialysis port located in upper left chest.

## 2011-05-18 NOTE — Consult Note (Signed)
Reason for Consult:ESRD, ?uremia Referring Physician: [name] is an 56 y.o. female.  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  Knows she should have gone to HD.  Cough with no sputum.  3 loose BM/d now also, no blood.  No V or fevers, but cold all the time. ROS Fair appetite but cannot eat a lot at once No taste No HA or visual or hearing prob Cough as above GI as above CV notes edema only when pointed out.  Chronic low BP GI as above Infx HIV on meds Skin Sores on back Ms skel no c/o Neuro c/o burning an numb feet Ambulates with walker Wgt stable according to her   Dialyzes at [facility] on TTS since 2010. Primary Nephrologist Dr. Moshe [name]. . Access [name] IJ Cath.  Past Medical History  Diagnosis Date  . Human immunodeficiency virus (HIV) disease   . Hypertension   . ESRD (end stage renal disease)   . Dialysis patient     pt on dialysis since 2010.  [name] Clostridium difficile infection 04/04/10  . Bacteriuria, asymptomatic 04/04/10    Culture grew VRE sensitive to linesolid   . MGUS (monoclonal gammopathy of unknown significance)   . History of bacteremia     MSSA  . Renal insufficiency   . Anemia     Past Surgical History  Procedure Date  . Av fistula placement   . Insertion of dialysis catheter 12/30/2010    Procedure: INSERTION OF DIALYSIS CATHETER;  Surgeon: [name] Dutch, MD;  Location: [place];  Service: Vascular;  Laterality: Left;  Insertion of left internal jugular dialysis catheter  . Vascular surgery     Family History  Problem Relation Age of Onset  . Alcohol abuse      family h/o addiction/alcoholism  . Diabetes Cousin     first degree relatives  . Kidney disease Mother   . Kidney disease Maternal Uncle   . Cancer Sister     Social History:  reports that she has never smoked. She has never used smokeless tobacco. She reports  that she does not drink alcohol or use illicit drugs.  Allergies: No Known Allergies  Medications: I have reviewed the patient's current medications. Wil get meds from [facility]   Results for orders placed during the hospital encounter of 05/18/11 (from the past 48 hour(s))  CBC     Status: Abnormal   Collection Time   05/18/11 12:59 PM      Component Value Range Comment   WBC 4.8  4.0 - 10.5 (K/uL)    RBC 3.24 (*) 3.87 - 5.11 (MIL/uL)    Hemoglobin 9.9 (*) 12.0 - 15.0 (g/dL)    HCT 29.7 (*) 36.0 - 46.0 (%)    MCV 91.7  78.0 - 100.0 (fL)    MCH 30.6  26.0 - 34.0 (pg)    MCHC 33.3  30.0 - 36.0 (g/dL)    RDW 20.5 (*) 11.5 - 15.5 (%)    Platelets 118 (*) 150 - 400 (K/uL) PLATELET CLUMPS NOTED ON SMEAR, COUNT APPEARS ADEQUATE  DIFFERENTIAL     Status: Abnormal   Collection Time   05/18/11 12:59 PM      Component Value Range Comment   Neutrophils Relative 52  43 - 77 (%)    Lymphocytes Relative 29  12 - 46 (%)    Monocytes  Relative 18 (*) 3 - 12 (%)    Eosinophils Relative 0  0 - 5 (%)    Basophils Relative 1  0 - 1 (%)    Neutro Abs 2.5  1.7 - 7.7 (K/uL)    Lymphs Abs 1.4  0.7 - 4.0 (K/uL)    Monocytes Absolute 0.9  0.1 - 1.0 (K/uL)    Eosinophils Absolute 0.0  0.0 - 0.7 (K/uL)    Basophils Absolute 0.0  0.0 - 0.1 (K/uL)    WBC Morphology INCREASED BANDS (>20% BANDS)      Smear Review LARGE PLATELETS PRESENT     COMPREHENSIVE METABOLIC PANEL     Status: Abnormal   Collection Time   05/18/11 12:59 PM      Component Value Range Comment   Sodium 136  135 - 145 (mEq/[name])    Potassium 3.6  3.5 - 5.1 (mEq/[name])    Chloride 100  96 - 112 (mEq/[name])    CO2 25  19 - 32 (mEq/[name])    Glucose, Bld 105 (*) 70 - 99 (mg/dL)    BUN 56 (*) 6 - 23 (mg/dL)    Creatinine, Ser 9.58 (*) 0.50 - 1.10 (mg/dL)    Calcium 8.0 (*) 8.4 - 10.5 (mg/dL)    Total Protein 9.0 (*) 6.0 - 8.3 (g/dL)    Albumin 1.5 (*) 3.5 - 5.2 (g/dL)    AST 46 (*) 0 - 37 (U/[name])    ALT 11  0 - 35 (U/[name])    Alkaline Phosphatase 374 (*) 39 -  117 (U/[name])    Total Bilirubin 0.8  0.3 - 1.2 (mg/dL)    GFR calc non Af Amer 4 (*) >90 (mL/min)    GFR calc Af Amer 5 (*) >90 (mL/min)   LIPASE, BLOOD     Status: Normal   Collection Time   05/18/11 12:59 PM      Component Value Range Comment   Lipase 12  11 - 59 (U/[name])   LACTIC ACID, PLASMA     Status: Abnormal   Collection Time   05/18/11  1:09 PM      Component Value Range Comment   Lactic Acid, Venous 3.3 (*) 0.5 - 2.2 (mmol/[name])   POCT I-STAT, CHEM 8     Status: Abnormal   Collection Time   05/18/11  1:21 PM      Component Value Range Comment   Sodium 142  135 - 145 (mEq/[name])    Potassium 3.7  3.5 - 5.1 (mEq/[name])    Chloride 107  96 - 112 (mEq/[name])    BUN 62 (*) 6 - 23 (mg/dL)    Creatinine, Ser 9.30 (*) 0.50 - 1.10 (mg/dL)    Glucose, Bld 106 (*) 70 - 99 (mg/dL)    Calcium, Ion 1.05 (*) 1.12 - 1.32 (mmol/[name])    TCO2 27  0 - 100 (mmol/[name])    Hemoglobin 11.6 (*) 12.0 - 15.0 (g/dL)    HCT 34.0 (*) 36.0 - 46.0 (%)   PROCALCITONIN     Status: Normal   Collection Time   05/18/11  5:02 PM      Component Value Range Comment   Procalcitonin 30.89     CARDIAC PANEL(CRET KIN+CKTOT+MB+TROPI)     Status: Abnormal   Collection Time   05/18/11  5:02 PM      Component Value Range Comment   Total CK 114  7 - 177 (U/[name])    CK, MB 3.7  0.3 - 4.0 (ng/mL)  Troponin I <0.30  <0.30 (ng/mL)    Relative Index 3.2 (*) 0.0 - 2.5    LACTIC ACID, PLASMA     Status: Abnormal   Collection Time   05/18/11  5:03 PM      Component Value Range Comment   Lactic Acid, Venous 3.1 (*) 0.5 - 2.2 (mmol/[name])   CBC     Status: Abnormal   Collection Time   05/18/11  5:08 PM      Component Value Range Comment   WBC 4.6  4.0 - 10.5 (K/uL)    RBC 2.70 (*) 3.87 - 5.11 (MIL/uL)    Hemoglobin 8.1 (*) 12.0 - 15.0 (g/dL) DELTA CHECK NOTED   HCT 24.7 (*) 36.0 - 46.0 (%)    MCV 91.5  78.0 - 100.0 (fL)    MCH 30.0  26.0 - 34.0 (pg)    MCHC 32.8  30.0 - 36.0 (g/dL)    RDW 20.1 (*) 11.5 - 15.5 (%)    Platelets 94 (*) 150 - 400  (K/uL) CONSISTENT WITH PREVIOUS RESULT  COMPREHENSIVE METABOLIC PANEL     Status: Abnormal   Collection Time   05/18/11  5:08 PM      Component Value Range Comment   Sodium 135  135 - 145 (mEq/[name]) DELTA CHECK NOTED   Potassium 3.3 (*) 3.5 - 5.1 (mEq/[name])    Chloride 101  96 - 112 (mEq/[name])    CO2 24  19 - 32 (mEq/[name])    Glucose, Bld 121 (*) 70 - 99 (mg/dL)    BUN 55 (*) 6 - 23 (mg/dL)    Creatinine, Ser 9.40 (*) 0.50 - 1.10 (mg/dL)    Calcium 7.4 (*) 8.4 - 10.5 (mg/dL)    Total Protein 7.5  6.0 - 8.3 (g/dL)    Albumin 1.2 (*) 3.5 - 5.2 (g/dL)    AST 36  0 - 37 (U/[name])    ALT 10  0 - 35 (U/[name])    Alkaline Phosphatase 329 (*) 39 - 117 (U/[name])    Total Bilirubin 0.7  0.3 - 1.2 (mg/dL)    GFR calc non Af Amer 4 (*) >90 (mL/min)    GFR calc Af Amer 5 (*) >90 (mL/min)   PROTIME-INR     Status: Abnormal   Collection Time   05/18/11  5:08 PM      Component Value Range Comment   Prothrombin Time 19.3 (*) 11.6 - 15.2 (seconds)    INR 1.60 (*) 0.00 - 1.49    APTT     Status: Abnormal   Collection Time   05/18/11  5:08 PM      Component Value Range Comment   aPTT 47 (*) 24 - 37 (seconds)   FIBRINOGEN     Status: Abnormal   Collection Time   05/18/11  5:08 PM      Component Value Range Comment   Fibrinogen 136 (*) 204 - 475 (mg/dL)   TYPE AND SCREEN     Status: Normal   Collection Time   05/18/11  5:08 PM      Component Value Range Comment   ABO/RH(D) O POS      Antibody Screen NEG      Sample Expiration 05/21/2011     GLUCOSE, CAPILLARY     Status: Abnormal   Collection Time   05/18/11  5:45 PM      Component Value Range Comment   Glucose-Capillary 103 (*) 70 - 99 (mg/dL)   CARBOXYHEMOGLOBIN  Status: Abnormal   Collection Time   05/18/11  6:00 PM      Component Value Range Comment   Total hemoglobin 7.8 (*) 12.5 - 16.0 (g/dL)    O2 Saturation 61.2      Carboxyhemoglobin 2.3 (*) 0.5 - 1.5 (%)    Methemoglobin 1.0  0.0 - 1.5 (%)     Dg Chest Port 1 View  05/18/2011  *RADIOLOGY REPORT*   Clinical Data: Stomach pain  PORTABLE CHEST - 1 VIEW  Comparison: 12/30/2010  Findings: Cardiomegaly again noted.  Left IJ double lumen dialysis catheter is unchanged in position.  There is bilateral perihilar airspace disease airspace disease right greater than left.  This is suspicious for pneumonia rather than asymmetric pulmonary edema. Clinical correlation is necessary.  IMPRESSION: Bilateral perihilar airspace disease right greater than left suspicious for pneumonia rather than pulmonary edema.  Clinical correlation is necessary.  Cardiomegaly again noted. Follow-up examination is recommended to assure resolution.  Original Report Authenticated By: Lahoma Crocker, M.D.    @ROS @ Blood pressure 93/55, pulse 82, temperature 99.5 F (37.5 C), temperature source Oral, resp. rate 23, weight 64.4 kg (141 lb 15.6 oz), SpO2 91.00%. @PHYSEXAMBYAGE2 @ Physical Examination: General appearance - chronically ill appearing and emaciated. Cooperative and oriented Mental status - alert, oriented to person, place, and time, slow but oriented Eyes - pupils equal and reactive, extraocular eye movements intact, funduscopic exam normal, discs flat and sharp Ears - not examined Mouth - whitish film C/W thrush Neck - adenopathy noted A & P adenopath Lymphatics - large tender anterior cervical nodes, posterior cervical nodes Chest - clear to auscultation, no wheezes, rales or rhonchi, symmetric air entry. Decreased BS Heart - normal rate, regular rhythm, normal S1, S2, no murmurs, rubs, clicks or gallops, systolic murmur 2/6 at apex Abdomen - bowel sounds normal hepatomegaly Liver down 6 cm, pos bs, mild distension Back exam - erosions,  Musculoskeletal - no joint tenderness, deformity or swelling Extremities - pedal edema 1 + Skin - DERMATITIS NOTED: erosions, on back  Assessment/Plan: 1 Possible pneu R/O sepsis.  Bp at baseline.  AB given. Immunsuppressed.  2 ESRD: noncompliance, vol ok, K ok, non acidemic HD in  am 3 HIV 4. Anemia of ESRD: Get records.  5. Metabolic Bone Disease: Get records,  6 Noncompliance 7 Malnutrition 8 Access P AB, HD, get records,   [name] 05/18/2011, 6:18 PM

## 2011-05-18 NOTE — Progress Notes (Addendum)
ANTIBIOTIC CONSULT NOTE - INITIAL  Pharmacy Consult for vancomycin and zosyn Indication: rule out sepsis and pneumonia  No Known Allergies  Patient Measurements:   Adjusted Body Weight:   Vital Signs: Temp: 99.5 F (37.5 C) (05/12 1624) Temp src: Oral (05/12 1624) BP: 90/54 mmHg (05/12 1646) Pulse Rate: 90  (05/12 1646) Intake/Output from previous day:   Intake/Output from this shift:    Labs:  Basename 05/18/11 1321 05/18/11 1259  WBC -- 4.8  HGB 11.6* 9.9*  PLT -- 118*  LABCREA -- --  CREATININE 9.30* 9.58*   The CrCl is unknown because both a height and weight (above a minimum accepted value) are required for this calculation. No results found for this basename: VANCOTROUGH:2,VANCOPEAK:2,VANCORANDOM:2,GENTTROUGH:2,GENTPEAK:2,GENTRANDOM:2,TOBRATROUGH:2,TOBRAPEAK:2,TOBRARND:2,AMIKACINPEAK:2,AMIKACINTROU:2,AMIKACIN:2, in the last 72 hours   Microbiology: No results found for this or any previous visit (from the past 720 hour(s)).  Medical History: Past Medical History  Diagnosis Date  . Human immunodeficiency virus (HIV) disease   . Hypertension   . ESRD (end stage renal disease)   . Dialysis patient     pt on dialysis since 2010.  [name] Clostridium difficile infection 04/04/10  . Bacteriuria, asymptomatic 04/04/10    Culture grew VRE sensitive to linesolid   . MGUS (monoclonal gammopathy of unknown significance)   . History of bacteremia     MSSA  . Renal insufficiency   . Anemia     Medications:  Scheduled:    . sodium chloride   Intravenous Once  . acetaminophen  650 mg Oral Once  . piperacillin-tazobactam (ZOSYN)  IV  3.375 g Intravenous Once  . sodium chloride  25 mL/kg Intravenous Once  . sodium chloride  250 mL Intravenous Once  . sodium chloride  250 mL Intravenous Once  . vancomycin  1,000 mg Intravenous Once   Infusions:    . sodium chloride     Assessment: 56 yo female with r/o sepsis and pneumonia will be continued on vancomycin and zosyn.  Patient received vancomycin 1g iv x1 in the ED at 1558 and zosyn 3.375g iv x1 at 1402.  Patient's ESRD on HD (missed last 2 sections). Per renal, HD on Monday.  Goal of Therapy:  pre-HD level 15-25 mcg/mL  Plan:  1) Vancomycin 500mg  iv x1 to equal a total of 1500mg  for the loading dose. 2) Zosyn 2.25g iv q8h, next dose at 2200 tonight. 3) Vancomycin 750mg  iv x1 with HD on Monday.  Follow up on schedule of HD for further vancomycin dosing.   Tanairi Cypert, Tsz-Yin 05/18/2011,4:59 PM

## 2011-05-18 NOTE — Progress Notes (Signed)
Dr. Jimmy Footman gave verbal permission at bedside to access HD cath for CVP.

## 2011-05-18 NOTE — ED Provider Notes (Signed)
History     CSN: [id number]  Arrival date & time 05/18/11  1209   First MD Initiated Contact with Patient 05/18/11 1231      Chief Complaint  Patient presents with  . Abdominal Pain    (Consider location/radiation/quality/duration/timing/severity/associated sxs/prior treatment) Patient is a 56 y.o. female presenting with abdominal pain. The history is provided by the patient and a relative.  Abdominal Pain The primary symptoms of the illness include abdominal pain.   patient here with weakness x3 days associated with cough and abdominal pain. Cough has been nonproductive, abdominal pain has been diffuse. Nausea without vomiting. No diarrhea. Patient has missed her last 2 dialysis treatments due 2 profound weakness. No rashes or headache or neck pain. No photophobia. No medications taken prior to arrival. Symptoms worse with walking and exertion and better with nothing Past Medical History  Diagnosis Date  . Human immunodeficiency virus (HIV) disease   . Hypertension   . ESRD (end stage renal disease)   . Dialysis patient     pt on dialysis since 2010.  [name] Clostridium difficile infection 04/04/10  . Bacteriuria, asymptomatic 04/04/10    Culture grew VRE sensitive to linesolid   . MGUS (monoclonal gammopathy of unknown significance)   . History of bacteremia     MSSA  . Renal insufficiency   . Anemia     Past Surgical History  Procedure Date  . Av fistula placement   . Insertion of dialysis catheter 12/30/2010    Procedure: INSERTION OF DIALYSIS CATHETER;  Surgeon: Elam Dutch, MD;  Location: [place];  Service: Vascular;  Laterality: Left;  Insertion of left internal jugular dialysis catheter  . Vascular surgery     Family History  Problem Relation Age of Onset  . Alcohol abuse      family h/o addiction/alcoholism  . Diabetes Cousin     first degree relatives  . Kidney disease Mother   . Kidney disease Maternal Uncle   . Cancer Sister     History  Substance Use  Topics  . Smoking status: Never Smoker   . Smokeless tobacco: Never Used  . Alcohol Use: No    OB History    Grav Para Term Preterm Abortions TAB SAB Ect Mult Living                  Review of Systems  Gastrointestinal: Positive for abdominal pain.  All other systems reviewed and are negative.    Allergies  Review of patient's allergies indicates no known allergies.  Home Medications   Current Outpatient Rx  Name Route Sig Dispense Refill  . ACETAMINOPHEN ER 650 MG PO TBCR Oral Take 650 mg by mouth every 8 (eight) hours as needed. For pain.    . ASPIRIN 81 MG PO TABS Oral Take 81 mg by mouth daily.      . B COMPLEX PO TABS Oral Take 1 tablet by mouth daily.      [name] NEPHRO-VITE 0.8 MG PO TABS Oral Take 0.8 mg by mouth daily.      [name] DARBEPOETIN ALFA-POLYSORBATE 150 MCG/0.3ML IJ SOLN Subcutaneous Inject 150 mcg into the skin. Use weekly for Epogen 20000 units. Dialysis nurse to administer.     [name] DARUNAVIR ETHANOLATE 400 MG PO TABS Oral Take 2 tablets (800 mg total) by mouth daily with breakfast. 60 tablet 1  . ENSURE PO LIQD Oral Take 237 mLs by mouth 3 (three) times daily between meals.      [name]  FAMOTIDINE 20 MG PO TABS Oral Take 20 mg by mouth daily.      . IRON SUCROSE 20 MG/ML IV SOLN  100 mg IV on Tuesday HD(HD RN to dispense)     . LAMIVUDINE 10 MG/ML PO SOLN  Take 1 teaspoon by mouth every day.     [name] PARICALCITOL 5 MCG/ML IV SOLN  4 micrograms IV- dispensed by dialysis nurse.     [name] RITONAVIR 100 MG PO TABS Oral Take 100 mg by mouth daily with breakfast.     . STAVUDINE 1 MG/ML PO SOLR  Take 4 teaspoons(20 ml) by mouth every day.     . SULFAMETHOXAZOLE-TRIMETHOPRIM 800-160 MG PO TABS Oral Take 1 tablet by mouth daily. Take 1 tablet by mouth every Monday, Wednesday and Friday.      BP 80/41  Pulse 102  Temp 100 F (37.8 C)  Resp 18  SpO2 99%  Physical Exam  Nursing note and vitals reviewed. Constitutional: She is oriented to person, place, and time. She appears  cachectic. She is cooperative.  Non-toxic appearance. She has a sickly appearance. She appears ill. No distress.  HENT:  Head: Normocephalic and atraumatic.  Eyes: EOM are normal. Pupils are equal, round, and reactive to light.       Pale conjunctivia  Neck: Normal range of motion. Neck supple. No tracheal deviation present. No mass present.  Cardiovascular: Normal rate, regular rhythm and normal heart sounds.  Exam reveals no gallop.   No murmur heard. Pulmonary/Chest: Effort normal and breath sounds normal. No stridor. No respiratory distress. She has no decreased breath sounds. She has no wheezes. She has no rhonchi. She has no rales.  Abdominal: Soft. Normal appearance and bowel sounds are normal. She exhibits no distension. There is generalized tenderness. There is no rigidity, no rebound, no guarding and no CVA tenderness.  Musculoskeletal: Normal range of motion. She exhibits no edema and no tenderness.  Neurological: She is oriented to person, place, and time. She has normal strength. No cranial nerve deficit or sensory deficit. GCS eye subscore is 4. GCS verbal subscore is 5. GCS motor subscore is 6.  Skin: Skin is warm and dry. No abrasion and no rash noted.  Psychiatric: She has a normal mood and affect. Her speech is normal and behavior is normal.    ED Course  Procedures (including critical care time)   Labs Reviewed  CBC  DIFFERENTIAL  COMPREHENSIVE METABOLIC PANEL  LACTIC ACID, PLASMA  LIPASE, BLOOD  URINALYSIS, ROUTINE W REFLEX MICROSCOPIC  URINE CULTURE  CULTURE, BLOOD (ROUTINE X 2)  CULTURE, BLOOD (ROUTINE X 2)   No results found.   No diagnosis found.    MDM  His temperature treated with Tylenol. Chest x-ray results noted an antibiotic started after blood cultures obtained. Patient given IV fluids for hypotension has responded well. She will be admitted to the internal medicine teaching service  CRITICAL CARE Performed by: Leota Jacobsen   Total  critical care time: 65   Critical care time was exclusive of separately billable procedures and treating other patients.  Critical care was necessary to treat or prevent imminent or life-threatening deterioration.  Critical care was time spent personally by me on the following activities: development of treatment plan with patient and/or surrogate as well as nursing, discussions with consultants, evaluation of patient's response to treatment, examination of patient, obtaining history from patient or surrogate, ordering and performing treatments and interventions, ordering and review of laboratory studies, ordering and review of  radiographic studies, pulse oximetry and re-evaluation of patient's condition.         Leota Jacobsen, MD 05/18/11 1416

## 2011-05-18 NOTE — ED Notes (Signed)
Floor nurse unable to take report at the time. To call back.

## 2011-05-18 NOTE — ED Notes (Signed)
Admitting at bedside. Pt remains on cardiac monitor. Will continue to monitor.

## 2011-05-18 NOTE — ED Notes (Signed)
Yellow unable to take patient at the time, nurse to call back.

## 2011-05-19 ENCOUNTER — Inpatient Hospital Stay (HOSPITAL_COMMUNITY): Payer: Medicaid Other

## 2011-05-19 LAB — COMPREHENSIVE METABOLIC PANEL
ALT: 10 U/L (ref 0–35)
AST: 42 U/L — ABNORMAL HIGH (ref 0–37)
Albumin: 1.2 g/dL — ABNORMAL LOW (ref 3.5–5.2)
Alkaline Phosphatase: 286 U/L — ABNORMAL HIGH (ref 39–117)
BUN: 60 mg/dL — ABNORMAL HIGH (ref 6–23)
CO2: 25 mEq/L (ref 19–32)
Calcium: 7.9 mg/dL — ABNORMAL LOW (ref 8.4–10.5)
Chloride: 100 mEq/L (ref 96–112)
Creatinine, Ser: 9.65 mg/dL — ABNORMAL HIGH (ref 0.50–1.10)
GFR calc Af Amer: 5 mL/min — ABNORMAL LOW (ref >90)
GFR calc non Af Amer: 4 mL/min — ABNORMAL LOW (ref >90)
Glucose, Bld: 97 mg/dL (ref 70–99)
Potassium: 3.3 mEq/L — ABNORMAL LOW (ref 3.5–5.1)
Sodium: 135 mEq/L (ref 135–145)
Total Bilirubin: 0.6 mg/dL (ref 0.3–1.2)
Total Protein: 7.9 g/dL (ref 6.0–8.3)

## 2011-05-19 LAB — POCT I-STAT 3, ART BLOOD GAS (G3+)
Acid-Base Excess: 4 mmol/L — ABNORMAL HIGH (ref 0.0–2.0)
Bicarbonate: 29.9 mEq/L — ABNORMAL HIGH (ref 20.0–24.0)
O2 Saturation: 96 %
Patient temperature: 37
TCO2: 31 mmol/L (ref 0–100)
pCO2 arterial: 47.6 mmHg — ABNORMAL HIGH (ref 35.0–45.0)
pH, Arterial: 7.406 — ABNORMAL HIGH (ref 7.350–7.400)
pO2, Arterial: 82 mmHg (ref 80.0–100.0)

## 2011-05-19 LAB — CBC
HCT: 25.5 % — ABNORMAL LOW (ref 36.0–46.0)
Hemoglobin: 8.3 g/dL — ABNORMAL LOW (ref 12.0–15.0)
MCH: 29.7 pg (ref 26.0–34.0)
MCHC: 32.5 g/dL (ref 30.0–36.0)
MCV: 91.4 fL (ref 78.0–100.0)
Platelets: 90 10*3/uL — ABNORMAL LOW (ref 150–400)
RBC: 2.79 MIL/uL — ABNORMAL LOW (ref 3.87–5.11)
RDW: 20.3 % — ABNORMAL HIGH (ref 11.5–15.5)
WBC: 3.3 10*3/uL — ABNORMAL LOW (ref 4.0–10.5)

## 2011-05-19 LAB — PHOSPHORUS: Phosphorus: 3.5 mg/dL (ref 2.3–4.6)

## 2011-05-19 LAB — T-HELPER CELLS (CD4) COUNT (NOT AT ARMC)
CD4 % Helper T Cell: 11 % — ABNORMAL LOW (ref 33–55)
CD4 T Cell Abs: 130 uL — ABNORMAL LOW (ref 400–2700)

## 2011-05-19 LAB — CRYPTOCOCCAL ANTIGEN: Crypto Ag: NEGATIVE

## 2011-05-19 LAB — PROCALCITONIN: Procalcitonin: 27.28 ng/mL

## 2011-05-19 LAB — LACTIC ACID, PLASMA: Lactic Acid, Venous: 1.7 mmol/L (ref 0.5–2.2)

## 2011-05-19 MED ORDER — ALBUMIN HUMAN 25 % IV SOLN
INTRAVENOUS | Status: AC
  Administered 2011-05-19: 25 g via INTRAVENOUS
  Filled 2011-05-19: qty 50

## 2011-05-19 MED ORDER — MIDODRINE HCL 5 MG PO TABS
10.0000 mg | ORAL_TABLET | Freq: Three times a day (TID) | ORAL | Status: DC
  Administered 2011-05-19 – 2011-05-30 (×32): 10 mg via ORAL
  Filled 2011-05-19 (×36): qty 2

## 2011-05-19 MED ORDER — SULFAMETHOXAZOLE-TRIMETHOPRIM 400-80 MG/5ML IV SOLN
320.0000 mg | INTRAVENOUS | Status: DC
  Administered 2011-05-19 – 2011-05-21 (×3): 320 mg via INTRAVENOUS
  Filled 2011-05-19 (×7): qty 20

## 2011-05-19 MED ORDER — DARBEPOETIN ALFA-POLYSORBATE 200 MCG/0.4ML IJ SOLN
200.0000 ug | INTRAMUSCULAR | Status: DC
  Filled 2011-05-19: qty 0.4

## 2011-05-19 MED ORDER — ALBUMIN HUMAN 25 % IV SOLN
INTRAVENOUS | Status: AC
  Administered 2011-05-19: 50 g via INTRAVENOUS
  Filled 2011-05-19: qty 50

## 2011-05-19 MED ORDER — VANCOMYCIN HCL 500 MG IV SOLR
500.0000 mg | INTRAVENOUS | Status: DC
  Administered 2011-05-20 – 2011-05-22 (×2): 500 mg via INTRAVENOUS
  Filled 2011-05-19 (×4): qty 500

## 2011-05-19 MED ORDER — PARICALCITOL 5 MCG/ML IV SOLN
10.0000 ug | INTRAVENOUS | Status: DC
  Administered 2011-05-19: 10 ug via INTRAVENOUS
  Filled 2011-05-19 (×3): qty 2

## 2011-05-19 MED ORDER — SODIUM CHLORIDE 0.9 % IV SOLN
125.0000 mg | Freq: Once | INTRAVENOUS | Status: AC
  Administered 2011-05-19: 125 mg via INTRAVENOUS
  Filled 2011-05-19: qty 10

## 2011-05-19 MED ORDER — DIPHENHYDRAMINE HCL 25 MG PO CAPS
ORAL_CAPSULE | ORAL | Status: AC
  Filled 2011-05-19: qty 1

## 2011-05-19 MED ORDER — ALBUMIN HUMAN 25 % IV SOLN
25.0000 g | Freq: Once | INTRAVENOUS | Status: AC
  Administered 2011-05-19: 25 g via INTRAVENOUS
  Administered 2011-05-19: 50 g via INTRAVENOUS

## 2011-05-19 MED ORDER — DARBEPOETIN ALFA-POLYSORBATE 200 MCG/0.4ML IJ SOLN: 200.0000 ug | INTRAMUSCULAR | Status: DC

## 2011-05-19 MED ORDER — ENSURE PUDDING PO PUDG
1.0000 | Freq: Three times a day (TID) | ORAL | Status: DC
  Administered 2011-05-19 – 2011-05-25 (×12): 1 via ORAL

## 2011-05-19 NOTE — Progress Notes (Signed)
CRITICAL VALUE ALERT  Critical value received:  BC anaerobic gram + cocci in clusters  Date of notification:  05/19/11  Time of notification:  1930  Critical value read back:yes  Nurse who received alert:  Lavona Mound, RN  MD notified (1st page):  Schoolr, MD  Time of first page:  1930  MD notified (2nd page):  Time of second page:  Responding MD:  Arrie Eastern, MD  Time MD responded:  [phone]

## 2011-05-19 NOTE — Progress Notes (Signed)
Pt transferred to HD with RN and monitor, pt tolerated well. Update given to [employer] .

## 2011-05-19 NOTE — Consult Note (Signed)
WOC consult Note Reason for Consult: requested to eval wounds of the pts back and of the right arm.  Not sure the etiology of the lesion on her arm but appears to be tear of the skin, partial thickness skin loss with epithelial buds in the wound bed.  Pt is not able to give me history but did report "burn" to other staff.  Lesions on her back are consistent with HIV status type lesions, she has scarring that indicate she has had these same type of lesions on her back in the past.   Wound type: HIV skin lesions, skin tear vs. Burn of the right lateral arm Measurement: R arm 3.0cm x 4.0cm x 0.2cm  Wound bed: yellow with epithelial buds in wound bed Drainage (amount, consistency, odor) serous, moderate, no odor Periwound:intact without problems Dressing procedure/placement/frequency: will continue silicone foam dressing for the back lesions and for the right arm lesion to protect, insulate and provide moist wound healing.   Re consult if needed, will not follow at this time. Thanks  Sha Amer Kellogg, [place] [phone])

## 2011-05-19 NOTE — Progress Notes (Addendum)
Subjective: Patient had early morning HD and just came back to the unit.  She states that she feels slightly better,  still c/o nonproductive cough, denies fever and chills. She reports mild nausea and loose watery stools 1-2 times since last night.  still c.o lower abdomen discomfort.   No acute event over night. Patient SBP ranges at high 90's -120's last night. She has had a little over 1 liter of fluid removed by HD today and was give AB during the HD. Her BP is stabilized at high 90's- 110's.   Objective: Vital signs in last 24 hours: Filed Vitals:   05/19/11 1300 05/19/11 1330 05/19/11 1400 05/19/11 1500  BP: 109/63 102/58 109/60 116/64  Pulse: 78 77 85 84  Temp:      TempSrc:      Resp: 27 26 25 20   Weight:      SpO2: 92% 90% 98% 97%   Weight change:   Intake/Output Summary (Last 24 hours) at 05/19/11 1745 Last data filed at 05/19/11 1500  Gross per 24 hour  Intake  620.5 ml  Output   1126 ml  Net -505.5 ml   Physical exam General: NAD. Cathectic and chronic ill appearing Skin: No petechiae is noted on MM. No splinter hemorrhage, [place] lesions or Osler nodes noted Scattered skin abrasions on her back. Right FA a healing ulcer noted Lungs: CTA B/L, left chest HD catheter. Heart: 2/6 systolic murmur noted on Apex, left sternum and Aortic valve area. Left side lift/heave noted.  Abd: Soft, tenderness to palpation suprapubic area.  BS x 4 MSK: Mild tenderness to palpation on bilateral plantar side of the feet. Ext: PPP+1, no edema.  Lab Results: Basic Metabolic Panel:  Lab [id number] 05/19/11 0500 05/18/11 1708  Zekiah Caruth -- 135 135  K -- 3.3* 3.3*  CL -- 100 101  CO2 -- 25 24  GLUCOSE -- 97 121*  BUN -- 60* 55*  CREATININE -- 9.65* 9.40*  CALCIUM -- 7.9* 7.4*  MG -- -- --  PHOS 3.5 -- --   Liver Function Tests:  Lab 05/19/11 0500 05/18/11 1708  AST 42* 36  ALT 10 10  ALKPHOS 286* 329*  BILITOT 0.6 0.7  PROT 7.9 7.5  ALBUMIN 1.2* 1.2*    Lab 05/18/11  1259  LIPASE 12  AMYLASE --   CBC:  Lab 05/19/11 0500 05/18/11 1708 05/18/11 1259  WBC 3.3* 4.6 --  NEUTROABS -- -- 2.5  HGB 8.3* 8.1* --  HCT 25.5* 24.7* --  MCV 91.4 91.5 --  PLT 90* 94* --   Cardiac Enzymes:  Lab 05/18/11 1702  CKTOTAL 114  CKMB 3.7  CKMBINDEX --  TROPONINI <0.30   CBG:  Lab 05/18/11 1745  GLUCAP 103*   Coagulation:  Lab 05/18/11 1708  LABPROT 19.3*  INR 1.60*    Micro Results: Recent Results (from the past 240 hour(s))  CULTURE, BLOOD (ROUTINE X 2)     Status: Normal (Preliminary result)   Collection Time   05/18/11  1:05 PM      Component Value Range Status Comment   Specimen Description BLOOD LEFT ARM   Final    Special Requests BOTTLES DRAWN AEROBIC AND ANAEROBIC 10CC   Final    Culture  Setup Time [id number]   Final    Culture     Final    Value:        BLOOD CULTURE RECEIVED NO GROWTH TO DATE CULTURE WILL BE HELD FOR 5 DAYS BEFORE  ISSUING A FINAL NEGATIVE REPORT   Report Status PENDING   Incomplete   CULTURE, BLOOD (ROUTINE X 2)     Status: Normal (Preliminary result)   Collection Time   05/18/11  1:11 PM      Component Value Range Status Comment   Specimen Description BLOOD RIGHT ARM   Final    Special Requests BOTTLES DRAWN AEROBIC ONLY 3CC   Final    Culture  Setup Time [id number]   Final    Culture     Final    Value:        BLOOD CULTURE RECEIVED NO GROWTH TO DATE CULTURE WILL BE HELD FOR 5 DAYS BEFORE ISSUING A FINAL NEGATIVE REPORT   Report Status PENDING   Incomplete   MRSA PCR SCREENING     Status: Normal   Collection Time   05/18/11  5:48 PM      Component Value Range Status Comment   MRSA by PCR NEGATIVE  NEGATIVE  Final    Studies/Results: Dg Chest Portable 1 View  05/19/2011  *RADIOLOGY REPORT*  Clinical Data: Sepsis.  Short of breath and cough.  Dialysis patient  PORTABLE CHEST - 1 VIEW  Comparison: 05/18/2011  Findings: Perihilar airspace disease bilaterally has progressed. Right lower lobe airspace disease also  has progressed slightly.  No effusion is present.  Left jugular catheter tip in the right atrium.  Cardiac enlargement.  IMPRESSION: Progression of bilateral airspace disease which may be pneumonia or edema.  Original Report Authenticated By: Truett Perna, M.D.   Dg Chest Port 1 View  05/18/2011  *RADIOLOGY REPORT*  Clinical Data: Stomach pain  PORTABLE CHEST - 1 VIEW  Comparison: 12/30/2010  Findings: Cardiomegaly again noted.  Left IJ double lumen dialysis catheter is unchanged in position.  There is bilateral perihilar airspace disease airspace disease right greater than left.  This is suspicious for pneumonia rather than asymmetric pulmonary edema. Clinical correlation is necessary.  IMPRESSION: Bilateral perihilar airspace disease right greater than left suspicious for pneumonia rather than pulmonary edema.  Clinical correlation is necessary.  Cardiomegaly again noted. Follow-up examination is recommended to assure resolution.  Original Report Authenticated By: Lahoma Crocker, M.D.   Medications: I have reviewed the patient's current medications. Scheduled Meds:   . albumin human  25 g Intravenous Once  . antiseptic oral rinse  15 mL Mouth Rinse q12n4p  . aspirin  81 mg Oral Daily  . chlorhexidine  15 mL Mouth Rinse BID  . darbepoetin (ARANESP) injection - DIALYSIS  200 mcg Intravenous Q Wed-HD  . darunavir  800 mg Oral Q breakfast  . diphenhydrAMINE      . famotidine  20 mg Oral Daily  . feeding supplement  1 Container Oral TID BM  . feeding supplement (NEPRO CARB STEADY)  237 mL Oral BID BM  . ferric gluconate (FERRLECIT/NULECIT) IV  125 mg Intravenous Once  . heparin  5,000 Units Subcutaneous Q8H  . lamiVUDine  25 mg Oral Daily  . midodrine  10 mg Oral TID WC  . multivitamin  1 tablet Oral Daily  . paricalcitol  10 mcg Intravenous 3 times weekly  . piperacillin-tazobactam (ZOSYN)  IV  2.25 g Intravenous Q8H  . piperacillin-tazobactam (ZOSYN)  IV  3.375 g Intravenous Once  . ritonavir   100 mg Oral Q breakfast  . sodium chloride  25 mL/kg Intravenous Once  . sodium chloride  3 mL Intravenous Q12H  . sodium chloride  3 mL Intravenous Q12H  .  stavudine  20 mg Oral Daily  . sulfamethoxazole-trimethoprim  320 mg Intravenous Q24H  . vancomycin  500 mg Intravenous To Major  . vancomycin  500 mg Intravenous Q T,Th,Sa-HD  . vancomycin  750 mg Intravenous Once  . DISCONTD: aspirin  81 mg Oral Daily  . DISCONTD: darbepoetin  150 mcg Subcutaneous Q7 days  . DISCONTD: darbepoetin (ARANESP) injection - DIALYSIS  200 mcg Intravenous Q Mon-HD  . DISCONTD: Stavudine  20 mL Oral Daily  . DISCONTD: sulfamethoxazole-trimethoprim  1 tablet Oral 3 times weekly  . DISCONTD: sulfamethoxazole-trimethoprim  1 tablet Oral Daily   Continuous Infusions:   . sodium chloride     PRN Meds:.sodium chloride, sodium chloride, sodium chloride, acetaminophen, alteplase, diphenhydrAMINE, feeding supplement (NEPRO CARB STEADY), heparin, heparin, lidocaine, lidocaine-prilocaine, ondansetron (ZOFRAN) IV, ondansetron, pentafluoroprop-tetrafluoroeth, sodium chloride, DISCONTD: acetaminophen Assessment/Plan:  #. Severe Sepsis , resolving Patient feels better and her symptoms are slightly improved. Responding well to 1 L IV fluids infusion overnight.  She is able to tolerate hemodialysis today with over 1 L fluid removal. Her SBPs are stabilized at  100s to 110s. Lactic acid trending down.  The etiologies of severe sepsis are HCAP vs PCP Vs HD catheter infection vs endocarditis.   - Will transfer her to SDU. - Blood culture pending - for HCAP >>> Continue Vanco and Zosyn  - for possible PCP>>>less likely to obtain sputum sample since she only has nonproductive cough.                                  >>>  will add Bactrim IV at 5mg /kg/hour daily for PCP Tx. She will need the treatment for 21 days                                >>> ABG shows PO2 82 ans Aa gradient of 8>>>no need for steroids for now. - for  endocarditis>>>we were able to obtain 2 sets of blood culture before ABX Tx.  - May consider TEE pending Blood culture.   # HCAP vs PCP PNA  ESRD with HD patient is found to have Right side opacity>>>HCAP Poorly controlled HIV due to medical noncompliance>>>ca not rule out PCP  - crypto antigen>>>Negative -- Blood culture x 4 ( for work up of endocarditis)  - sputum sample ordered>>>less likely to obtain satisfactory sample due to her nonproductive cough   Should her s/s are worsening, will consult pulmonary for Bronch. - start Vancomycin, Zosyn and bactrim see above note  - symptomatic treatment for cough.   # Uremia syndrome  Patient presented with nausea, abdomen pain and generalized with weakness for one week which correlated with  Her missing 2 sessions of hemodialysis. Her BUN is 56, which is significantly elevated comparing to her BUN of 12 in February of 2013.   -  HD per Renal team    # ESRD with HD  She has had a multiple sites infections upper grafts and access in the past. The vascular surgeons have stated that she has no further options for upper extremity dialysis access. She has had a left IJ Diatek cath placed by Dr. Oneida Alar on 12 /24/12.  She has missed 2 sessions of her dialysis this week due to generalized weakness and fatigue. Her potassium is 3.6, CO2 25, and CR 9.58 on admission.   # shock liver Mild elevation  of AST with ALP of 374 on admission. ALP trending down [phone]  - continue to monitor.  # Diarrhea:  The etiology is unclear. DDX include Gastroenteritis vs C-diff infection  - will check C-diff PCR .   # Suprapubic pain  patient does not produce urine, thus she has no urinary symptoms. 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  Urine sample not enough for UA  - Urine culture by I+O cath to rule out UTI.   # Congestive cardiomyopathy LV EF 25-30% Per TEE on 10/02/10 Patient is not volume overload on admission.  Caution IVF replacement  for sepsis and septic shock  -Continue to monitor # HIV infection  This is a chronic problem. She does have a poor compliance with her medications. Her last CD4 220 and VL [id number] on 02/19/11.  - will continue her home medications  - CD4 130 and VL pending  #Thrombocytopenia  Chronic stable. Likely due to end-stage renal disease.   # Anemia of chronic disease, baseline 8-9  stable    # Mild Hypertension  Baseline SBP ranges at 140's. Not on any medical treatment.  # Malnutrition, likely due to renal disease  Albumin 1.5 on this admission  - Oral supplement  -Nutritionist consult  - albumin Tx given per renal  # VTE: Heparin   LOS: 1 day   Miniya Miguez 05/19/2011, 5:45 PM

## 2011-05-19 NOTE — Progress Notes (Signed)
Pt continues to refuse to eat anything. Pt offered Nepro can, pt accepted and did not drink. Pt refused to eat both lunch and dinner.

## 2011-05-19 NOTE — Progress Notes (Signed)
INITIAL ADULT NUTRITION ASSESSMENT Date: 05/19/2011   Time: 3:07 PM  Reason for Assessment: MD Consult Re: Low Albumin  ASSESSMENT: Female 56 y.o.  Dx: Severe sepsis without septic shock  Hx:  Past Medical History  Diagnosis Date  . Human immunodeficiency virus (HIV) disease   . Hypertension   . ESRD (end stage renal disease)   . Dialysis patient     pt on dialysis since 2010.  [name] Clostridium difficile infection 04/04/10  . Bacteriuria, asymptomatic 04/04/10    Culture grew VRE sensitive to linesolid   . MGUS (monoclonal gammopathy of unknown significance)   . History of bacteremia     MSSA  . Renal insufficiency   . Anemia     Related Meds:  Scheduled Meds:   . albumin human  25 g Intravenous Once  . antiseptic oral rinse  15 mL Mouth Rinse q12n4p  . aspirin  81 mg Oral Daily  . chlorhexidine  15 mL Mouth Rinse BID  . darbepoetin (ARANESP) injection - DIALYSIS  200 mcg Intravenous Q Wed-HD  . darunavir  800 mg Oral Q breakfast  . diphenhydrAMINE      . famotidine  20 mg Oral Daily  . feeding supplement (NEPRO CARB STEADY)  237 mL Oral BID BM  . ferric gluconate (FERRLECIT/NULECIT) IV  125 mg Intravenous Once  . heparin  5,000 Units Subcutaneous Q8H  . lamiVUDine  25 mg Oral Daily  . midodrine  10 mg Oral TID WC  . multivitamin  1 tablet Oral Daily  . paricalcitol  10 mcg Intravenous 3 times weekly  . piperacillin-tazobactam (ZOSYN)  IV  2.25 g Intravenous Q8H  . piperacillin-tazobactam (ZOSYN)  IV  3.375 g Intravenous Once  . ritonavir  100 mg Oral Q breakfast  . sodium chloride  25 mL/kg Intravenous Once  . sodium chloride  250 mL Intravenous Once  . sodium chloride  3 mL Intravenous Q12H  . sodium chloride  3 mL Intravenous Q12H  . stavudine  20 mg Oral Daily  . sulfamethoxazole-trimethoprim  320 mg Intravenous Q24H  . vancomycin  500 mg Intravenous To Major  . vancomycin  500 mg Intravenous Q T,Th,Sa-HD  . vancomycin  750 mg Intravenous Once  . vancomycin   1,000 mg Intravenous Once  . DISCONTD: aspirin  81 mg Oral Daily  . DISCONTD: darbepoetin  150 mcg Subcutaneous Q7 days  . DISCONTD: darbepoetin (ARANESP) injection - DIALYSIS  200 mcg Intravenous Q Mon-HD  . DISCONTD: Stavudine  20 mL Oral Daily  . DISCONTD: sulfamethoxazole-trimethoprim  1 tablet Oral 3 times weekly  . DISCONTD: sulfamethoxazole-trimethoprim  1 tablet Oral Daily   Continuous Infusions:   . sodium chloride     PRN Meds:.sodium chloride, sodium chloride, sodium chloride, acetaminophen, alteplase, diphenhydrAMINE, feeding supplement (NEPRO CARB STEADY), heparin, heparin, lidocaine, lidocaine-prilocaine, ondansetron (ZOFRAN) IV, ondansetron, pentafluoroprop-tetrafluoroeth, sodium chloride, DISCONTD: acetaminophen   Ht:  5\' 9"  (175.3 cm)  Wt: 118 lb 6.2 oz (53.7 kg)  Ideal Wt: 65.9 kg % Ideal Wt: 81%  Wt Readings from Last 15 Encounters:  05/19/11 118 lb 6.2 oz (53.7 kg)  01/22/11 142 lb (64.411 kg)  12/30/10 137 lb 12.6 oz (62.5 kg)  12/30/10 137 lb 12.6 oz (62.5 kg)  12/23/10 133 lb (60.328 kg)  12/23/10 133 lb (60.328 kg)  12/11/10 133 lb (60.328 kg)  12/17/09 167 lb 11.2 oz (76.068 kg)  09/03/09 169 lb 9.6 oz (76.93 kg)  08/03/09 163 lb 6.4 oz (74.118 kg)  06/27/09  172 lb 1.9 oz (78.073 kg)  06/06/09 169 lb 1.9 oz (76.712 kg)  05/02/09 176 lb 8 oz (80.06 kg)  01/24/09 174 lb (78.926 kg)  01/10/09 172 lb 12.8 oz (78.382 kg)    Usual Wt: 142 lb (4 months ago) % Usual Wt: 83%  BMI=17.5  Food/Nutrition Related Hx: 17% weight loss over the past 4 months; unable to eat a lot at one time; poor appetite; drinks Nepro during dialysis.  Labs:  CMP     Component Value Date/Time   NA 135 05/19/2011 0500   K 3.3* 05/19/2011 0500   CL 100 05/19/2011 0500   CO2 25 05/19/2011 0500   GLUCOSE 97 05/19/2011 0500   BUN 60* 05/19/2011 0500   CREATININE 9.65* 05/19/2011 0500   CREATININE 4.98* 02/19/2011 1048   CALCIUM 7.9* 05/19/2011 0500   CALCIUM 7.6* 09/14/2007 1321     PROT 7.9 05/19/2011 0500   ALBUMIN 1.2* 05/19/2011 0500   AST 42* 05/19/2011 0500   ALT 10 05/19/2011 0500   ALKPHOS 286* 05/19/2011 0500   BILITOT 0.6 05/19/2011 0500   GFRNONAA 4* 05/19/2011 0500   GFRAA 5* 05/19/2011 0500  Phosphorus 3.5 WNL  CBG (last 3)   Basename 05/18/11 1745  GLUCAP 103*     Intake/Output Summary (Last 24 hours) at 05/19/11 1548 Last data filed at 05/19/11 1300  Gross per 24 hour  Intake   1598 ml  Output   1126 ml  Net    472 ml     Diet Order: Renal 60/70-2-2 with 1200 ml fluid restriction  Supplements/Tube Feeding:  Nepro PO BID between meals, Nepro PO prn  IVF:    sodium chloride    Estimated Nutritional Needs:   Kcal: 1700-1900 Protein: 79-92 grams Fluid: 1.2 liters  Patient with diarrhea.  Stool being checked for C. diff.  Low albumin reflects acute stress response associated with sepsis.  Patient with severe malnutrition in the context of chronic illness given 17% weight loss in <6 months and severe muscle loss (facial and temporal wasting).  Patient reports she's unable to eat a lot at one time.  Has been eating only 2 meals daily.  Drinks Nepro on dialysis days at the dialysis center.  Is still working on her first Nepro for the day that RN gave her this morning.  NUTRITION DIAGNOSIS: -Malnutrition (NI-5.2).  Status: Ongoing  RELATED TO: inadequate oral intake  AS EVIDENCED BY: temporal and facial wasting, 17% weight loss in 4 months, and BMI = 17.5  MONITORING/EVALUATION(Goals): Goal:  Intake to meet 90-100% of estimated nutrition needs to promote repletion of nutrition stores. Monitor:  PO intake, labs, weight trend.  EDUCATION NEEDS: -Education not appropriate at this time  INTERVENTION:  Continue Nepro PO BID to maximize oral intake.  Recommend liberalize diet to Regular to help improve PO intake.  Ensure Pudding (vanilla) PO TID between meals.  Dietitian #:  [phone]  [place] Per approved criteria   -Severe malnutrition in the context of chronic illness -Underweight    Dalene Carrow 05/19/2011, 3:07 PM

## 2011-05-19 NOTE — Progress Notes (Signed)
Attending Note  I have seen and examined [name] and reviewed her care in detail with Dr. Nicoletta Dress. [name] has AIDS and antiviral noncompliance who presents in septic shock which is now responded to sepsis bundle. She has new pulmonary infiltrates and agree treating her for HCAP as well as possible PCP. Other possible sources for fever and shock are line related sepsis with possible endocarditis. She has currently grade [id number] systolic murmur that likely varies with fever and volume status. If blood cultures are positive then TTE may be helpful guide to management but [name] would not be surgical candidate given her debility, malnourished state, and non-compliance. Lars Mage

## 2011-05-19 NOTE — Progress Notes (Signed)
UR Completed.  [name] [id number] 05/19/2011

## 2011-05-19 NOTE — Progress Notes (Signed)
Subjective:   Seen on HD, itching on back (s/p IV Benadryl), poor appetite.  Objective: Vital signs in last 24 hours: Temp:  [97 F (36.1 C)-102.4 F (39.1 C)] 97 F (36.1 C) (05/13 0630) Pulse Rate:  [74-110] 83  (05/13 0900) Resp:  [16-24] 24  (05/13 0900) BP: (80-137)/(40-91) 122/77 mmHg (05/13 0900) SpO2:  [88 %-100 %] 98 % (05/13 0900) Weight:  [55.6 kg (122 lb 9.2 oz)-64.4 kg (141 lb 15.6 oz)] 55.7 kg (122 lb 12.7 oz) (05/13 0630) Weight change:   Intake/Output from previous day: 05/12 0701 - 05/13 0700 In: 1418 [P.O.:180; I.V.:1088; IV Piggyback:150] Out: -    EXAM: General appearance:  Chronically ill, alert, in no apparent distress  Resp:  Decreased breath sounds, but CTA bilaterally Cardio:  RRR with gr II/VI systolic murmur GI:  + BS, soft and nontender Extremities:  Trace edema Access:  Left IJ catheter with BFR 400 cc/min  Lab Results:  Basename 05/19/11 0500 05/18/11 1708  WBC 3.3* 4.6  HGB 8.3* 8.1*  HCT 25.5* 24.7*  PLT 90* 94*   BMET:  Basename 05/19/11 0500 05/18/11 1708  NA 135 135  K 3.3* 3.3*  CL 100 101  CO2 25 24  GLUCOSE 97 121*  BUN 60* 55*  CREATININE 9.65* 9.40*  CALCIUM 7.9* 7.4*  ALBUMIN 1.2* 1.2*   No results found for this basename: PTH:2 in the last 72 hours Iron Studies: No results found for this basename: IRON,TIBC,TRANSFERRIN,FERRITIN in the last 72 hours  Assessment/Plan: 1. ? Pneumonia/sepsis - recently on Ancef per HD (until 5/4) for drainage from catheter exit site; chest x-ray with bilateral perihilar airspace disease R > L suspicious for pneumonia; blood cultures pending; on IV Vancomycin and Zosyn. 2. ESRD - HD on TTS ar [place], but missed on 5/9 and 5/11; pre-HD K 3.3 on 4K bath.  HD tomorrow on regular schedule. 3. HTN/Volume - BP 101/64, on Midodrine as outpatient due to chronic hypotension issues 4. Anemia - Hgb 8.3 on outpatient Epogen 10,400 U per HD and weekly Venofer.  Will give Aranesp 200 mcg and weekly  Fe. 5. Metabolic bone disease - Ca 7.9 (10.1 corrected), P 3.5 on Zemplar 10 mcg per HD, Phoslo 1 with meals as outpatient.  Will give Zemplar. 6. Nutrition - Alb 1.2, on Megace as outpatient. 7. HIV - on antiviral meds, followed by ID.   LOS: 1 day   LYLES,CHARLES 05/19/2011,9:14 AM  Patient seen and examined and agree with assessment and plan as above.  Kelly Splinter  MD [employer] [phone] pgr    [phone] cell 05/19/2011, 5:18 PM

## 2011-05-20 ENCOUNTER — Inpatient Hospital Stay (HOSPITAL_COMMUNITY): Payer: Medicaid Other

## 2011-05-20 LAB — COMPREHENSIVE METABOLIC PANEL
ALT: 10 U/L (ref 0–35)
AST: 33 U/L (ref 0–37)
Albumin: 1.3 g/dL — ABNORMAL LOW (ref 3.5–5.2)
Alkaline Phosphatase: 310 U/L — ABNORMAL HIGH (ref 39–117)
BUN: 17 mg/dL (ref 6–23)
CO2: 27 mEq/L (ref 19–32)
Calcium: 7.9 mg/dL — ABNORMAL LOW (ref 8.4–10.5)
Chloride: 97 mEq/L (ref 96–112)
Creatinine, Ser: 3.89 mg/dL — ABNORMAL HIGH (ref 0.50–1.10)
GFR calc Af Amer: 14 mL/min — ABNORMAL LOW (ref >90)
GFR calc non Af Amer: 12 mL/min — ABNORMAL LOW (ref >90)
Glucose, Bld: 86 mg/dL (ref 70–99)
Potassium: 3.7 mEq/L (ref 3.5–5.1)
Sodium: 131 mEq/L — ABNORMAL LOW (ref 135–145)
Total Bilirubin: 0.7 mg/dL (ref 0.3–1.2)
Total Protein: 7.6 g/dL (ref 6.0–8.3)

## 2011-05-20 LAB — CBC
HCT: 27.6 % — ABNORMAL LOW (ref 36.0–46.0)
Hemoglobin: 8.8 g/dL — ABNORMAL LOW (ref 12.0–15.0)
MCH: 29.4 pg (ref 26.0–34.0)
MCHC: 31.9 g/dL (ref 30.0–36.0)
MCV: 92.3 fL (ref 78.0–100.0)
Platelets: 103 10*3/uL — ABNORMAL LOW (ref 150–400)
RBC: 2.99 MIL/uL — ABNORMAL LOW (ref 3.87–5.11)
RDW: 20.7 % — ABNORMAL HIGH (ref 11.5–15.5)
WBC: 3.2 10*3/uL — ABNORMAL LOW (ref 4.0–10.5)

## 2011-05-20 LAB — URINE CULTURE
Colony Count: NO GROWTH
Culture  Setup Time: 201305122135
Culture: NO GROWTH

## 2011-05-20 LAB — HIV-1 RNA QUANT-NO REFLEX-BLD
HIV 1 RNA Quant: 1636434 copies/mL — ABNORMAL HIGH (ref <20)
HIV-1 RNA Quant, Log: 6.21 {Log} — ABNORMAL HIGH (ref <1.30)

## 2011-05-20 LAB — CLOSTRIDIUM DIFFICILE BY PCR: Toxigenic C. Difficile by PCR: NEGATIVE

## 2011-05-20 LAB — LACTIC ACID, PLASMA: Lactic Acid, Venous: 2 mmol/L (ref 0.5–2.2)

## 2011-05-20 LAB — PHOSPHORUS: Phosphorus: 1.5 mg/dL — ABNORMAL LOW (ref 2.3–4.6)

## 2011-05-20 MED ORDER — DARBEPOETIN ALFA-POLYSORBATE 200 MCG/0.4ML IJ SOLN
200.0000 ug | INTRAMUSCULAR | Status: DC
  Administered 2011-05-22 – 2011-05-29 (×2): 200 ug via INTRAVENOUS
  Filled 2011-05-20 (×2): qty 0.4

## 2011-05-20 MED ORDER — GUAIFENESIN-DM 100-10 MG/5ML PO SYRP
5.0000 mL | ORAL_SOLUTION | ORAL | Status: DC | PRN
  Filled 2011-05-20: qty 5

## 2011-05-20 MED ORDER — LOPERAMIDE HCL 2 MG PO CAPS
2.0000 mg | ORAL_CAPSULE | Freq: Once | ORAL | Status: AC
  Administered 2011-05-20: 2 mg via ORAL
  Filled 2011-05-20: qty 1

## 2011-05-20 MED ORDER — BIOTENE DRY MOUTH MT LIQD
15.0000 mL | Freq: Two times a day (BID) | OROMUCOSAL | Status: DC
  Administered 2011-05-20 – 2011-05-30 (×17): 15 mL via OROMUCOSAL

## 2011-05-20 MED ORDER — RISAQUAD PO CAPS
1.0000 | ORAL_CAPSULE | Freq: Every day | ORAL | Status: DC
  Administered 2011-05-20 – 2011-05-30 (×10): 1 via ORAL
  Filled 2011-05-20 (×14): qty 1

## 2011-05-20 MED ORDER — MIDODRINE HCL 5 MG PO TABS
ORAL_TABLET | ORAL | Status: AC
  Administered 2011-05-20: 10 mg
  Filled 2011-05-20: qty 2

## 2011-05-20 MED ORDER — PARICALCITOL 5 MCG/ML IV SOLN: 10.0000 ug | INTRAVENOUS | Status: DC

## 2011-05-20 NOTE — Progress Notes (Signed)
Subjective:   Feeling a little better, going upstairs for HD.   Objective: Vital signs in last 24 hours: Temp:  [97 F (36.1 C)-98.7 F (37.1 C)] 97 F (36.1 C) (05/14 0925) Pulse Rate:  [54-85] 64  (05/14 1215) Resp:  [13-27] 18  (05/14 0925) BP: (79-124)/(49-74) 85/59 mmHg (05/14 1215) SpO2:  [89 %-100 %] 100 % (05/14 1215) Weight:  [55.3 kg (121 lb 14.6 oz)-55.8 kg (123 lb 0.3 oz)] 55.8 kg (123 lb 0.3 oz) (05/14 0925) Weight change: -10.7 kg (-23 lb 9.4 oz)  Intake/Output from previous day: 05/13 0701 - 05/14 0700 In: 890 [I.V.:70; IV Piggyback:820] Out: 1129 [Stool:3] Total I/O In: 150 [P.O.:150] Out: 1 [Emesis/NG output:1] EXAM: General appearance:  Chronically ill, alert, in no apparent distress  Resp:  Decreased breath sounds, but CTA bilaterally Cardio:  RRR with gr II/VI systolic murmur GI:  + BS, soft and nontender Extremities:  Trace edema Access:  Left IJ catheter with BFR 400 cc/min  Lab Results:  Basename 05/20/11 1022 05/19/11 0500  WBC 3.2* 3.3*  HGB 8.8* 8.3*  HCT 27.6* 25.5*  PLT 103* 90*   BMET:   Basename 05/20/11 0605 05/19/11 0500  NA 131* 135  K 3.7 3.3*  CL 97 100  CO2 27 25  GLUCOSE 86 97  BUN 17 60*  CREATININE 3.89* 9.65*  CALCIUM 7.9* 7.9*  ALBUMIN 1.3* 1.2*   No results found for this basename: PTH:2 in the last 72 hours Iron Studies: No results found for this basename: IRON,TIBC,TRANSFERRIN,FERRITIN in the last 72 hours  Assessment/Plan: 1. Pneumonia/sepsis - recently on Ancef per HD (until 5/4) for drainage from catheter exit site; chest x-ray with bilateral perihilar airspace disease R > L suspicious for pneumonia; blood cultures + for GPC in clusters, on IV Vancomycin and Zosyn. Await final cultures, follow clinical response. 2. ESRD / TTS / [place]- missed on 5/9 and 5/11.  HD yesterday and again today on regular schedule. 3. HTN/Volume - BP 101/64, on Midodrine as outpatient due to chronic hypotension issues.  Continue. 4. Anemia - Hgb 8.3 on outpatient Epogen 10,400 U per HD and weekly Venofer.  Aranesp 200 mcg and weekly IV Fe. 5. Metabolic bone disease - Ca 7.9 (10.1 corrected), P 3.5 on Zemplar 10 mcg per HD, Phoslo 1 with meals as outpatient. Will continue Zemplar. 6. Nutrition - Alb 1.2, on Megace as outpatient. 7. HIV - on antiviral meds, followed by ID.   LOS: 2 days   Lemario Chaikin D 05/20/2011,12:31 PM  Patient seen and examined and agree with assessment and plan as above.  Kelly Splinter  MD [facility] [phone] pgr    [phone] cell 05/20/2011, 12:31 PM

## 2011-05-20 NOTE — Progress Notes (Signed)
Pt transported off the unit to HD

## 2011-05-20 NOTE — Progress Notes (Addendum)
Subjective: Patient states that she feels slightly better. Still c/o mild nonproductive cough. No SOB or wheezing. Denies nausea, vomiting and abdominal pain. Denies fever and chills.   Patient states that she still feels tired and does not have appetite. No acute events overnight.  Objective: Vital signs in last 24 hours: Filed Vitals:   05/20/11 1124 05/20/11 1130 05/20/11 1200 05/20/11 1215  BP: 92/58 90/61 85/55  85/59  Pulse: 54 57 59 64  Temp:      TempSrc:      Resp:      Weight:      SpO2: 100% 100% 100% 100%   Weight change: -23 lb 9.4 oz (-10.7 kg)  Intake/Output Summary (Last 24 hours) at 05/20/11 1217 Last data filed at 05/20/11 0900  Gross per 24 hour  Intake    870 ml  Output      4 ml  Net    866 ml   General: NAD. Cathectic and chronic ill appearing  Skin: No petechiae is noted on MM. No splinter hemorrhage, [place] lesions or Osler nodes noted  Scattered skin abrasions on her back. Right FA a healing ulcer noted  Lungs: CTA B/L, left chest HD catheter.  Heart: 2/6 systolic murmur noted on Apex, left sternum and Aortic valve area. Left side lift/heave noted.  Abd: Soft, tenderness to palpation suprapubic area. BS x 4  MSK: Mild tenderness to palpation on bilateral plantar side of the feet. Ext: PPP+1, no edema.   Lab Results: Basic Metabolic Panel:  Lab [id number] 05/20/11 0605 05/19/11 0700 05/19/11 0500  Lexa Coronado -- 131* -- 135  K -- 3.7 -- 3.3*  CL -- 97 -- 100  CO2 -- 27 -- 25  GLUCOSE -- 86 -- 97  BUN -- 17 -- 60*  CREATININE -- 3.89* -- 9.65*  CALCIUM -- 7.9* -- 7.9*  MG -- -- -- --  PHOS 1.5* -- 3.5 --   Liver Function Tests:  Lab 05/20/11 0605 05/19/11 0500  AST 33 42*  ALT 10 10  ALKPHOS 310* 286*  BILITOT 0.7 0.6  PROT 7.6 7.9  ALBUMIN 1.3* 1.2*    Lab 05/18/11 1259  LIPASE 12  AMYLASE --   CBC:  Lab 05/20/11 1022 05/19/11 0500 05/18/11 1259  WBC 3.2* 3.3* --  NEUTROABS -- -- 2.5  HGB 8.8* 8.3* --  HCT 27.6* 25.5* --  MCV  92.3 91.4 --  PLT 103* 90* --   Cardiac Enzymes:  Lab 05/18/11 1702  CKTOTAL 114  CKMB 3.7  CKMBINDEX --  TROPONINI <0.30   CBG:  Lab 05/18/11 1745  GLUCAP 103*   Coagulation:  Lab 05/18/11 1708  LABPROT 19.3*  INR 1.60*    Micro Results: Recent Results (from the past 240 hour(s))  CULTURE, BLOOD (ROUTINE X 2)     Status: Normal (Preliminary result)   Collection Time   05/18/11  1:05 PM      Component Value Range Status Comment   Specimen Description BLOOD LEFT ARM   Final    Special Requests BOTTLES DRAWN AEROBIC AND ANAEROBIC 10CC   Final    Culture  Setup Time [id number]   Final    Culture     Final    Value: GRAM POSITIVE COCCI IN CLUSTERS     Note: Gram Stain Report Called to,Read Back By and Verified With: KELSEY WHITE @1916  ON 05/19/11 BY MCLET   Report Status PENDING   Incomplete   CULTURE, BLOOD (ROUTINE X 2)  Status: Normal (Preliminary result)   Collection Time   05/18/11  1:11 PM      Component Value Range Status Comment   Specimen Description BLOOD RIGHT ARM   Final    Special Requests BOTTLES DRAWN AEROBIC ONLY 3CC   Final    Culture  Setup Time [id number]   Final    Culture     Final    Value:        BLOOD CULTURE RECEIVED NO GROWTH TO DATE CULTURE WILL BE HELD FOR 5 DAYS BEFORE ISSUING A FINAL NEGATIVE REPORT   Report Status PENDING   Incomplete   URINE CULTURE     Status: Normal   Collection Time   05/18/11  3:42 PM      Component Value Range Status Comment   Specimen Description URINE, CLEAN CATCH   Final    Special Requests NONE   Final    Culture  Setup Time [id number]   Final    Colony Count NO GROWTH   Final    Culture NO GROWTH   Final    Report Status 05/20/2011 FINAL   Final   CULTURE, BLOOD (ROUTINE X 2)     Status: Normal (Preliminary result)   Collection Time   05/18/11  5:08 PM      Component Value Range Status Comment   Specimen Description BLOOD LEFT ARM   Final    Special Requests BOTTLES DRAWN AEROBIC AND ANAEROBIC  10CC   Final    Culture  Setup Time [id number]   Final    Culture     Final    Value:        BLOOD CULTURE RECEIVED NO GROWTH TO DATE CULTURE WILL BE HELD FOR 5 DAYS BEFORE ISSUING A FINAL NEGATIVE REPORT   Report Status PENDING   Incomplete   CULTURE, BLOOD (ROUTINE X 2)     Status: Normal (Preliminary result)   Collection Time   05/18/11  5:23 PM      Component Value Range Status Comment   Specimen Description BLOOD RIGHT HAND   Final    Special Requests BOTTLES DRAWN AEROBIC AND ANAEROBIC 10CC   Final    Culture  Setup Time [id number]   Final    Culture     Final    Value:        BLOOD CULTURE RECEIVED NO GROWTH TO DATE CULTURE WILL BE HELD FOR 5 DAYS BEFORE ISSUING A FINAL NEGATIVE REPORT   Report Status PENDING   Incomplete   MRSA PCR SCREENING     Status: Normal   Collection Time   05/18/11  5:48 PM      Component Value Range Status Comment   MRSA by PCR NEGATIVE  NEGATIVE  Final   CLOSTRIDIUM DIFFICILE BY PCR     Status: Normal   Collection Time   05/20/11  5:29 AM      Component Value Range Status Comment   C difficile by pcr NEGATIVE  NEGATIVE  Final    Studies/Results: Dg Chest Portable 1 View  05/19/2011  *RADIOLOGY REPORT*  Clinical Data: Sepsis.  Short of breath and cough.  Dialysis patient  PORTABLE CHEST - 1 VIEW  Comparison: 05/18/2011  Findings: Perihilar airspace disease bilaterally has progressed. Right lower lobe airspace disease also has progressed slightly.  No effusion is present.  Left jugular catheter tip in the right atrium.  Cardiac enlargement.  IMPRESSION: Progression of bilateral airspace disease which may be pneumonia or edema.  Original Report Authenticated By: Truett Perna, M.D.   Dg Chest Port 1 View  05/18/2011  *RADIOLOGY REPORT*  Clinical Data: Stomach pain  PORTABLE CHEST - 1 VIEW  Comparison: 12/30/2010  Findings: Cardiomegaly again noted.  Left IJ double lumen dialysis catheter is unchanged in position.  There is bilateral perihilar airspace  disease airspace disease right greater than left.  This is suspicious for pneumonia rather than asymmetric pulmonary edema. Clinical correlation is necessary.  IMPRESSION: Bilateral perihilar airspace disease right greater than left suspicious for pneumonia rather than pulmonary edema.  Clinical correlation is necessary.  Cardiomegaly again noted. Follow-up examination is recommended to assure resolution.  Original Report Authenticated By: Lahoma Crocker, M.D.   Medications: I have reviewed the patient's current medications. Scheduled Meds:   . antiseptic oral rinse  15 mL Mouth Rinse BID  . aspirin  81 mg Oral Daily  . darbepoetin (ARANESP) injection - DIALYSIS  200 mcg Intravenous Q Thu-HD  . darunavir  800 mg Oral Q breakfast  . famotidine  20 mg Oral Daily  . feeding supplement  1 Container Oral TID BM  . feeding supplement (NEPRO CARB STEADY)  237 mL Oral BID BM  . ferric gluconate (FERRLECIT/NULECIT) IV  125 mg Intravenous Once  . heparin  5,000 Units Subcutaneous Q8H  . lamiVUDine  25 mg Oral Daily  . midodrine  10 mg Oral TID WC  . multivitamin  1 tablet Oral Daily  . paricalcitol  10 mcg Intravenous Q T,Th,Sa-HD  . piperacillin-tazobactam (ZOSYN)  IV  2.25 g Intravenous Q8H  . ritonavir  100 mg Oral Q breakfast  . sodium chloride  25 mL/kg Intravenous Once  . sodium chloride  3 mL Intravenous Q12H  . sodium chloride  3 mL Intravenous Q12H  . stavudine  20 mg Oral Daily  . sulfamethoxazole-trimethoprim  320 mg Intravenous Q24H  . vancomycin  500 mg Intravenous Q T,Th,Sa-HD  . DISCONTD: antiseptic oral rinse  15 mL Mouth Rinse q12n4p  . DISCONTD: chlorhexidine  15 mL Mouth Rinse BID  . DISCONTD: darbepoetin (ARANESP) injection - DIALYSIS  200 mcg Intravenous Q Mon-HD  . DISCONTD: darbepoetin (ARANESP) injection - DIALYSIS  200 mcg Intravenous Q Wed-HD  . DISCONTD: paricalcitol  10 mcg Intravenous 3 times weekly   Continuous Infusions:   . sodium chloride     PRN Meds:.sodium  chloride, sodium chloride, sodium chloride, acetaminophen, diphenhydrAMINE, feeding supplement (NEPRO CARB STEADY), heparin, heparin, lidocaine, lidocaine-prilocaine, ondansetron (ZOFRAN) IV, ondansetron, pentafluoroprop-tetrafluoroeth, sodium chloride Assessment/Plan:  #. Severe Sepsis , resolving  Patient feels better and her symptoms are slightly improved. She is hemodynamically stable. Lactic acid trending down and is normal today. The etiologies of severe sepsis are likely HCAP vs PCP Vs HD catheter infection vs endocarditis.   - Transfer her Tele  - Blood culture>>>1/2 GPC in clusters. - for HCAP >>> Continue Vanco and Zosyn  - for possible PCP>>>less likely to obtain sputum sample since she only has nonproductive cough.                               >>> add Bactrim IV at 5mg /kg/hour daily for PCP Tx. She will need the treatment for 21 days                                >>> ABG shows PO2 82 ans Aa gradient of  8>>>no need for steroids for now.  - for endocarditis>>>we were able2 sets of blood culture before ABX Tx.  - will obtain Echo  # HCAP vs PCP PNA  ESRD with HD patient is found to have Right side opacity>>>HCAP  Poorly controlled HIV due to medical noncompliance>>>ca not rule out PCP   - crypto antigen>>>Negative  -- Blood culture x 4 ( for work up of endocarditis)  - Vancomycin, Zosyn and bactrim see above note  - symptomatic treatment for cough.   # Uremia syndrome , resolving Patient presented with nausea, abdomen pain and generalized with weakness for one week which correlated with  Her missing 2 sessions of hemodialysis. - HD per Renal team   # ESRD with HD  She has had a multiple sites infections upper grafts and access in the past. The vascular surgeons have stated that she has no further options for upper extremity dialysis access. She has had a left IJ Diatek cath placed by Dr. Oneida Alar on 12 /24/12.  She has missed 2 sessions of her dialysis this week due to  generalized weakness and fatigue. Her potassium is 3.6, CO2 25, and CR 9.58 on admission.   # shock liver  Mild elevation of AST with ALP of 374 on admission. INR mild evlevated  ALP trending down [phone]  - continue to monitor.   # Diarrhea:  The etiology is unclear. DDX include Gastroenteritis vs C-diff infection  - Negative C-diff PCR  - Probiotics - imodium PRN  # Suprapubic pain  patient does not produce urine, thus she has no urinary symptoms. 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  Urine sample not enough for UA  - Urine culture pending   # Congestive cardiomyopathy  LV EF 25-30% Per TEE on 10/02/10  Patient is not volume overload on admission. Caution IVF replacement for sepsis and septic shock  -Continue to monitor   # HIV infection  This is a chronic problem. She does have a poor compliance with her medications. Her last CD4 220 and VL [id number] on 02/19/11.  - will continue her home medications  - CD4 130 and VL [id number]  #Thrombocytopenia  Chronic stable. Likely due to end-stage renal disease.   # Anemia of chronic disease, baseline 8-9  stable   # Mild Hypertension  Baseline SBP ranges at 140's. Not on any medical treatment.   # Malnutrition, likely due to renal disease  Albumin 1.5 on this admission  - Oral supplement  -Nutritionist consult  - albumin Tx given per renal   # VTE: Heparin    LOS: 2 days   Dayon Witt 05/20/2011, 12:17 PM

## 2011-05-21 ENCOUNTER — Encounter (HOSPITAL_COMMUNITY): Payer: Self-pay | Admitting: General Practice

## 2011-05-21 DIAGNOSIS — I359 Nonrheumatic aortic valve disorder, unspecified: Secondary | ICD-10-CM

## 2011-05-21 LAB — COMPREHENSIVE METABOLIC PANEL
ALT: 11 U/L (ref 0–35)
AST: 39 U/L — ABNORMAL HIGH (ref 0–37)
Albumin: 1.2 g/dL — ABNORMAL LOW (ref 3.5–5.2)
Alkaline Phosphatase: 329 U/L — ABNORMAL HIGH (ref 39–117)
BUN: 9 mg/dL (ref 6–23)
CO2: 28 mEq/L (ref 19–32)
Calcium: 7.9 mg/dL — ABNORMAL LOW (ref 8.4–10.5)
Chloride: 101 mEq/L (ref 96–112)
Creatinine, Ser: 2.44 mg/dL — ABNORMAL HIGH (ref 0.50–1.10)
GFR calc Af Amer: 25 mL/min — ABNORMAL LOW (ref >90)
GFR calc non Af Amer: 21 mL/min — ABNORMAL LOW (ref >90)
Glucose, Bld: 76 mg/dL (ref 70–99)
Potassium: 4 mEq/L (ref 3.5–5.1)
Sodium: 135 mEq/L (ref 135–145)
Total Bilirubin: 0.5 mg/dL (ref 0.3–1.2)
Total Protein: 7.5 g/dL (ref 6.0–8.3)

## 2011-05-21 MED ORDER — LOPERAMIDE HCL 2 MG PO CAPS
2.0000 mg | ORAL_CAPSULE | ORAL | Status: DC | PRN
  Administered 2011-05-24: 2 mg via ORAL
  Filled 2011-05-21 (×3): qty 1

## 2011-05-21 MED ORDER — PARICALCITOL 5 MCG/ML IV SOLN
8.0000 ug | INTRAVENOUS | Status: DC
  Administered 2011-05-22 – 2011-05-29 (×3): 8 ug via INTRAVENOUS
  Filled 2011-05-21 (×5): qty 1.6

## 2011-05-21 MED ORDER — HEPARIN SODIUM (PORCINE) 1000 UNIT/ML DIALYSIS
2000.0000 [IU] | INTRAMUSCULAR | Status: DC | PRN
  Administered 2011-05-22: 2000 [IU] via INTRAVENOUS_CENTRAL
  Filled 2011-05-21: qty 2

## 2011-05-21 MED ORDER — LOPERAMIDE HCL 2 MG PO CAPS
4.0000 mg | ORAL_CAPSULE | Freq: Once | ORAL | Status: AC
  Administered 2011-05-21: 4 mg via ORAL

## 2011-05-21 NOTE — Progress Notes (Signed)
*  PRELIMINARY RESULTS* Echocardiogram 2D Echocardiogram has been performed.  Roxine Caddy [facility] 05/21/2011, 4:23 PM

## 2011-05-21 NOTE — Consult Note (Signed)
ANTIBIOTIC CONSULT NOTE - FOLLOW UP  Pharmacy Consult for Vancomycin and Zosyn Indication: Severe sepsis without shock, PNA  No Known Allergies  Patient Measurements: Height: 5\' 9"  (175.3 cm) Weight: 118 lb 2.7 oz (53.6 kg) (BEDSCALE) IBW/kg (Calculated) : 66.2    Vital Signs: Blood pressure 113/63, pulse 57, temperature 98.3 F (36.8 C), temperature source Oral, resp. rate 18, height 5\' 9"  (1.753 m), weight 118 lb 2.7 oz (53.6 kg), SpO2 100.00%.  Intake/Output from previous day: 05/14 0701 - 05/15 0700 In: 1187 [P.O.:567; IV Piggyback:620] Out: 1829 [Emesis/NG output:1]  Labs:  Basename 05/21/11 0515 05/20/11 1022 05/20/11 0605 05/19/11 0500 05/18/11 1708  WBC -- 3.2* -- 3.3* 4.6  HGB -- 8.8* -- 8.3* 8.1*  PLT -- 103* -- 90* 94*  LABCREA -- -- -- -- --  CREATININE 2.44* -- 3.89* 9.65* --      Microbiology: Recent Results (from the past 720 hour(s))  CULTURE, BLOOD (ROUTINE X 2)     Status: Normal (Preliminary result)   Collection Time   05/18/11  1:05 PM      Component Value Range Status Comment   Specimen Description BLOOD LEFT ARM   Final    Special Requests BOTTLES DRAWN AEROBIC AND ANAEROBIC 10CC   Final    Culture  Setup Time [id number]   Final    Culture     Final    Value: STAPHYLOCOCCUS AUREUS     Note: RIFAMPIN AND GENTAMICIN SHOULD NOT BE USED AS SINGLE DRUGS FOR TREATMENT OF STAPH INFECTIONS.     STAPHYLOCOCCUS SPECIES (COAGULASE NEGATIVE)     Note: THE SIGNIFICANCE OF ISOLATING THIS ORGANISM FROM A SINGLE SET OF BLOOD CULTURES WHEN MULTIPLE SETS ARE DRAWN IS UNCERTAIN. PLEASE NOTIFY THE MICROBIOLOGY DEPARTMENT WITHIN ONE WEEK IF SPECIATION AND SENSITIVITIES ARE REQUIRED.     Note: Gram Stain Report Called to,Read Back By and Verified With: KELSEY WHITE @1916  ON 05/19/11 BY MCLET   Report Status PENDING   Incomplete   CULTURE, BLOOD (ROUTINE X 2)     Status: Normal (Preliminary result)   Collection Time   05/18/11  1:11 PM      Component Value Range  Status Comment   Specimen Description BLOOD RIGHT ARM   Final    Special Requests BOTTLES DRAWN AEROBIC ONLY 3CC   Final    Culture  Setup Time [id number]   Final    Culture     Final    Value:        BLOOD CULTURE RECEIVED NO GROWTH TO DATE CULTURE WILL BE HELD FOR 5 DAYS BEFORE ISSUING A FINAL NEGATIVE REPORT   Report Status PENDING   Incomplete   URINE CULTURE     Status: Normal   Collection Time   05/18/11  3:42 PM      Component Value Range Status Comment   Specimen Description URINE, CLEAN CATCH   Final    Special Requests NONE   Final    Culture  Setup Time [id number]   Final    Colony Count NO GROWTH   Final    Culture NO GROWTH   Final    Report Status 05/20/2011 FINAL   Final   CULTURE, BLOOD (ROUTINE X 2)     Status: Normal (Preliminary result)   Collection Time   05/18/11  5:08 PM      Component Value Range Status Comment   Specimen Description BLOOD LEFT ARM   Final    Special Requests BOTTLES DRAWN AEROBIC  AND ANAEROBIC 10CC   Final    Culture  Setup Time [id number]   Final    Culture     Final    Value:        BLOOD CULTURE RECEIVED NO GROWTH TO DATE CULTURE WILL BE HELD FOR 5 DAYS BEFORE ISSUING A FINAL NEGATIVE REPORT   Report Status PENDING   Incomplete   CULTURE, BLOOD (ROUTINE X 2)     Status: Normal (Preliminary result)   Collection Time   05/18/11  5:23 PM      Component Value Range Status Comment   Specimen Description BLOOD RIGHT HAND   Final    Special Requests BOTTLES DRAWN AEROBIC AND ANAEROBIC 10CC   Final    Culture  Setup Time [id number]   Final    Culture     Final    Value:        BLOOD CULTURE RECEIVED NO GROWTH TO DATE CULTURE WILL BE HELD FOR 5 DAYS BEFORE ISSUING A FINAL NEGATIVE REPORT   Report Status PENDING   Incomplete   MRSA PCR SCREENING     Status: Normal   Collection Time   05/18/11  5:48 PM      Component Value Range Status Comment   MRSA by PCR NEGATIVE  NEGATIVE  Final   CLOSTRIDIUM DIFFICILE BY PCR     Status: Normal    Collection Time   05/20/11  5:29 AM      Component Value Range Status Comment   C difficile by pcr NEGATIVE  NEGATIVE  Final     Anti-infectives Anti-infectives     Start     Dose/Rate Route Frequency Ordered Stop   05/20/11 1200   vancomycin (VANCOCIN) 500 mg in sodium chloride 0.9 % 100 mL IVPB        500 mg 100 mL/hr over 60 Minutes Intravenous Every T-Th-Sa (Hemodialysis) 05/19/11 1105     05/19/11 2000  sulfamethoxazole-trimethoprim (BACTRIM) 320 mg in dextrose 5 % 500 mL IVPB       320 mg 346.7 mL/hr over 90 Minutes Intravenous Every 24 hours 05/19/11 1032     05/19/11 1200   vancomycin (VANCOCIN) 750 mg in sodium chloride 0.9 % 150 mL IVPB        750 mg 150 mL/hr over 60 Minutes Intravenous  Once 05/18/11 1911 05/19/11 1101   05/19/11 1000   lamiVUDine (EPIVIR) 10 MG/ML solution 25 mg        25 mg Oral Daily 05/18/11 1750     05/19/11 1000   stavudine (ZERIT) capsule 20 mg        20 mg Oral Daily 05/18/11 1759     05/19/11 0900   sulfamethoxazole-trimethoprim (BACTRIM DS) 800-160 MG per tablet 1 tablet  Status:  Discontinued        1 tablet Oral Once per day on Mon Wed Fri 05/18/11 1759 05/19/11 1032   05/19/11 0800   Darunavir Ethanolate (PREZISTA) tablet 800 mg        800 mg Oral Daily with breakfast 05/18/11 1750     05/19/11 0800   ritonavir (NORVIR) tablet 100 mg        100 mg Oral Daily with breakfast 05/18/11 1750     05/18/11 2200   piperacillin-tazobactam (ZOSYN) IVPB 2.25 g        2.25 g 100 mL/hr over 30 Minutes Intravenous Every 8 hours 05/18/11 1749     05/18/11 1800   Stavudine SOLR 20 mg  Status:  Discontinued        20 mL Oral Daily 05/18/11 1750 05/18/11 1758   05/18/11 1800   sulfamethoxazole-trimethoprim (BACTRIM DS,SEPTRA DS) 800-160 MG per tablet 1 tablet  Status:  Discontinued        1 tablet Oral Daily 05/18/11 1750 05/18/11 1758   05/18/11 1730   vancomycin (VANCOCIN) 500 mg in sodium chloride 0.9 % 100 mL IVPB        500 mg 100 mL/hr  over 60 Minutes Intravenous To [facility] 05/18/11 1702 05/18/11 2138   05/18/11 1400   piperacillin-tazobactam (ZOSYN) IVPB 3.375 g        3.375 g 12.5 mL/hr over 240 Minutes Intravenous  Once 05/18/11 1348 05/18/11 1802   05/18/11 1400   vancomycin (VANCOCIN) IVPB 1000 mg/200 mL premix        1,000 mg 200 mL/hr over 60 Minutes Intravenous  Once 05/18/11 1348 05/18/11 1658          Assessment:  56 y/o female with ESRD and HIV receiving Vancomycin and Zosyn for severe sepsis and PNA.  Day # 4 for Vancomycin and Zosyn.  Day # 3 Bactrim.  Continuing on home Norvir, Stavudine, Prezista, Zerit, and Epivir.  Afebrile.  WBC 3.2.   ANC 2.5   Goal of Therapy:   Pre-HD Vancomycin level 15 - 25 mcg/ml  Plan:   Continue Zosyn 2.25 gm IV q 8 hours.  Vancomycin 500 mg IV q HD  TTSa.  Consider changing Bactrim to po dose.  Katurah Karapetian, Craig Guess, Pharm.D. 05/21/2011 1:20 PM

## 2011-05-21 NOTE — Progress Notes (Signed)
Subjective:   Feeling better, still nonprod cough.    Objective: Vital signs in last 24 hours: Temp:  [97.6 F (36.4 C)-100 F (37.8 C)] 98.3 F (36.8 C) (05/15 0645) Pulse Rate:  [50-69] 57  (05/15 1122) Resp:  [16-22] 18  (05/15 0645) BP: (85-113)/(47-63) 113/63 mmHg (05/15 1122) SpO2:  [99 %-100 %] 100 % (05/15 0645) Weight:  [53.3 kg (117 lb 8.1 oz)-53.6 kg (118 lb 2.7 oz)] 53.6 kg (118 lb 2.7 oz) (05/15 0645) Weight change: 2.1 kg (4 lb 10.1 oz)  Intake/Output from previous day: 05/14 0701 - 05/15 0700 In: 1187 [P.O.:567; IV Piggyback:620] Out: 1829 [Emesis/NG output:1] Total I/O In: 50 [IV Piggyback:50] Out: -  EXAM: General appearance:  Chronically ill, alert, in no apparent distress  Resp:  Decreased breath sounds, but CTA bilaterally Cardio:  RRR with gr II/VI systolic murmur GI:  + BS, soft and nontender Extremities:  Trace edema Access:  Left IJ catheter with BFR 400 cc/min  Lab Results:  Basename 05/20/11 1022 05/19/11 0500  WBC 3.2* 3.3*  HGB 8.8* 8.3*  HCT 27.6* 25.5*  PLT 103* 90*   BMET:   Basename 05/21/11 0515 05/20/11 0605  NA 135 131*  K 4.0 3.7  CL 101 97  CO2 28 27  GLUCOSE 76 86  BUN 9 17  CREATININE 2.44* 3.89*  CALCIUM 7.9* 7.9*  ALBUMIN 1.2* 1.3*   [facility]: [facility], 4 hrs, 56 kg EDW. EPO 10,4000. Zemplar 8ug. No IV Fe (d/c'd in April). L IJ TDC.   Assessment/Plan 1. Pneumonia/sepsis - bilat focal dense infiltrates R > L. 1/4 BCx's + for CNSS, prob contaminant. Has tunnelled HD cath with clean exit site- doubt catheter sepsis with isolated CNSS culture. On IV Vancomycin and Zosyn. 2. ESRD / TTS / [place]- missed on 5/9 and 5/11.  HD tomorrow, no sig UF, she is below dry weight. 3. Chronic hypotension/Volume - BP 101/64, on Midodrine as outpatient due to chronic hypotension issues. Continue. 4. Anemia - Hgb 8.3 on outpatient Epogen 10,400 U per HD and weekly Venofer.  Aranesp 200 mcg here, and weekly IV Fe. 5. Metabolic bone  disease - Ca 7.9 (10.1 corrected), P 3.5 on Zemplar 8 mcg per HD. Will continue. HD records show phoslo 1ac and tums 3ac with meals. Will d/w pt if she was taking both. Phos low here 1.5, so binders on hold. Ca 7.9, adj 10.1.  6. Nutrition - Alb 1.2, on Megace as outpatient. 7. HIV - on antiviral meds, followed by ID.  Kelly Splinter  MD [facility] [phone] pgr    [phone] cell 05/21/2011, 1:04 PM

## 2011-05-21 NOTE — Progress Notes (Addendum)
Subjective: Subjective:  Patient states that she feels slightly better.  mild nonproductive cough. No SOB or wheezing.  Denies nausea, vomiting and abdominal pain. Denies fever and chills.  Patient states that she still feels tired and does not have appetite. No acute events overnight  Objective: Vital signs in last 24 hours: Filed Vitals:   05/20/11 2030 05/21/11 0245 05/21/11 0645 05/21/11 1122  BP: 89/56 105/63 94/47 113/63  Pulse: 63 69 59 57  Temp: 100 F (37.8 C) 99.1 F (37.3 C) 98.3 F (36.8 C)   TempSrc: Oral Oral Oral   Resp: 16 22 18    Height:      Weight:   118 lb 2.7 oz (53.6 kg)   SpO2: 100% 99% 100%    Weight change: 4 lb 10.1 oz (2.1 kg)  Intake/Output Summary (Last 24 hours) at 05/21/11 1537 Last data filed at 05/21/11 1000  Gross per 24 hour  Intake   1087 ml  Output      0 ml  Net   1087 ml   General: NAD. Cathectic and chronic ill appearing  Skin: No petechiae is noted on MM. No splinter hemorrhage, Janeway lesions or Osler nodes noted  Scattered skin abrasions on her back. Right FA a healing ulcer noted  Lungs: CTA B/L, left chest HD catheter.  Heart: RRR,  2/6 systolic at Aortic valve area. Left side lift/heave noted. Previous 2-3 systolic murmurs noted on Apex and left sternal border are not detected today.  Abd: Soft, tenderness to palpation suprapubic area. BS x 4  MSK: Mild tenderness to palpation on bilateral plantar side of the feet. Ext: PPP+1, no edema.   Lab Results: Basic Metabolic Panel:  Lab [id number] 0515 05/20/11 1022 05/20/11 0605 05/19/11 0700  Trason Shifflet 135 -- 131* --  K 4.0 -- 3.7 --  CL 101 -- 97 --  CO2 28 -- 27 --  GLUCOSE 76 -- 86 --  BUN 9 -- 17 --  CREATININE 2.44* -- 3.89* --  CALCIUM 7.9* -- 7.9* --  MG -- -- -- --  PHOS -- 1.5* -- 3.5   Liver Function Tests:  Lab 05/21/11 0515 05/20/11 0605  AST 39* 33  ALT 11 10  ALKPHOS 329* 310*  BILITOT 0.5 0.7  PROT 7.5 7.6  ALBUMIN 1.2* 1.3*    Lab 05/18/11 1259  LIPASE  12  AMYLASE --   CBC:  Lab 05/20/11 1022 05/19/11 0500 05/18/11 1259  WBC 3.2* 3.3* --  NEUTROABS -- -- 2.5  HGB 8.8* 8.3* --  HCT 27.6* 25.5* --  MCV 92.3 91.4 --  PLT 103* 90* --   Cardiac Enzymes:  Lab 05/18/11 1702  CKTOTAL 114  CKMB 3.7  CKMBINDEX --  TROPONINI <0.30   CBG:  Lab 05/18/11 1745  GLUCAP 103*   Coagulation:  Lab 05/18/11 1708  LABPROT 19.3*  INR 1.60*    Micro Results: Recent Results (from the past 240 hour(s))  CULTURE, BLOOD (ROUTINE X 2)     Status: Normal (Preliminary result)   Collection Time   05/18/11  1:05 PM      Component Value Range Status Comment   Specimen Description BLOOD LEFT ARM   Final    Special Requests BOTTLES DRAWN AEROBIC AND ANAEROBIC 10CC   Final    Culture  Setup Time [id number]   Final    Culture     Final    Value: STAPHYLOCOCCUS AUREUS     Note: RIFAMPIN AND GENTAMICIN SHOULD NOT BE USED  AS SINGLE DRUGS FOR TREATMENT OF STAPH INFECTIONS.     STAPHYLOCOCCUS SPECIES (COAGULASE NEGATIVE)     Note: THE SIGNIFICANCE OF ISOLATING THIS ORGANISM FROM A SINGLE SET OF BLOOD CULTURES WHEN MULTIPLE SETS ARE DRAWN IS UNCERTAIN. PLEASE NOTIFY THE MICROBIOLOGY DEPARTMENT WITHIN ONE WEEK IF SPECIATION AND SENSITIVITIES ARE REQUIRED.     Note: Gram Stain Report Called to,Read Back By and Verified With: KELSEY WHITE @1916  ON 05/19/11 BY MCLET   Report Status PENDING   Incomplete   CULTURE, BLOOD (ROUTINE X 2)     Status: Normal (Preliminary result)   Collection Time   05/18/11  1:11 PM      Component Value Range Status Comment   Specimen Description BLOOD RIGHT ARM   Final    Special Requests BOTTLES DRAWN AEROBIC ONLY 3CC   Final    Culture  Setup Time [id number]   Final    Culture     Final    Value:        BLOOD CULTURE RECEIVED NO GROWTH TO DATE CULTURE WILL BE HELD FOR 5 DAYS BEFORE ISSUING A FINAL NEGATIVE REPORT   Report Status PENDING   Incomplete   URINE CULTURE     Status: Normal   Collection Time   05/18/11  3:42 PM       Component Value Range Status Comment   Specimen Description URINE, CLEAN CATCH   Final    Special Requests NONE   Final    Culture  Setup Time [id number]   Final    Colony Count NO GROWTH   Final    Culture NO GROWTH   Final    Report Status 05/20/2011 FINAL   Final   CULTURE, BLOOD (ROUTINE X 2)     Status: Normal (Preliminary result)   Collection Time   05/18/11  5:08 PM      Component Value Range Status Comment   Specimen Description BLOOD LEFT ARM   Final    Special Requests BOTTLES DRAWN AEROBIC AND ANAEROBIC 10CC   Final    Culture  Setup Time [id number]   Final    Culture     Final    Value:        BLOOD CULTURE RECEIVED NO GROWTH TO DATE CULTURE WILL BE HELD FOR 5 DAYS BEFORE ISSUING A FINAL NEGATIVE REPORT   Report Status PENDING   Incomplete   CULTURE, BLOOD (ROUTINE X 2)     Status: Normal (Preliminary result)   Collection Time   05/18/11  5:23 PM      Component Value Range Status Comment   Specimen Description BLOOD RIGHT HAND   Final    Special Requests BOTTLES DRAWN AEROBIC AND ANAEROBIC 10CC   Final    Culture  Setup Time [id number]   Final    Culture     Final    Value:        BLOOD CULTURE RECEIVED NO GROWTH TO DATE CULTURE WILL BE HELD FOR 5 DAYS BEFORE ISSUING A FINAL NEGATIVE REPORT   Report Status PENDING   Incomplete   MRSA PCR SCREENING     Status: Normal   Collection Time   05/18/11  5:48 PM      Component Value Range Status Comment   MRSA by PCR NEGATIVE  NEGATIVE  Final   CLOSTRIDIUM DIFFICILE BY PCR     Status: Normal   Collection Time   05/20/11  5:29 AM      Component Value Range Status  Comment   C difficile by pcr NEGATIVE  NEGATIVE  Final    Studies/Results: Dg Chest Portable 1 View  05/19/2011  *RADIOLOGY REPORT*  Clinical Data: Sepsis.  Short of breath and cough.  Dialysis patient  PORTABLE CHEST - 1 VIEW  Comparison: 05/18/2011  Findings: Perihilar airspace disease bilaterally has progressed. Right lower lobe airspace disease also has  progressed slightly.  No effusion is present.  Left jugular catheter tip in the right atrium.  Cardiac enlargement.  IMPRESSION: Progression of bilateral airspace disease which may be pneumonia or edema.  Original Report Authenticated By: Truett Perna, M.D.   Medications: I have reviewed the patient's current medications. Scheduled Meds:   . acidophilus  1 capsule Oral Daily  . antiseptic oral rinse  15 mL Mouth Rinse BID  . aspirin  81 mg Oral Daily  . darbepoetin (ARANESP) injection - DIALYSIS  200 mcg Intravenous Q Thu-HD  . darunavir  800 mg Oral Q breakfast  . famotidine  20 mg Oral Daily  . feeding supplement  1 Container Oral TID BM  . feeding supplement (NEPRO CARB STEADY)  237 mL Oral BID BM  . heparin  5,000 Units Subcutaneous Q8H  . lamiVUDine  25 mg Oral Daily  . loperamide  2 mg Oral Once  . loperamide  4 mg Oral Once  . midodrine      . midodrine  10 mg Oral TID WC  . multivitamin  1 tablet Oral Daily  . paricalcitol  8 mcg Intravenous Q T,Th,Sa-HD  . piperacillin-tazobactam (ZOSYN)  IV  2.25 g Intravenous Q8H  . ritonavir  100 mg Oral Q breakfast  . sodium chloride  25 mL/kg Intravenous Once  . sodium chloride  3 mL Intravenous Q12H  . sodium chloride  3 mL Intravenous Q12H  . stavudine  20 mg Oral Daily  . sulfamethoxazole-trimethoprim  320 mg Intravenous Q24H  . vancomycin  500 mg Intravenous Q T,Th,Sa-HD  . DISCONTD: paricalcitol  10 mcg Intravenous Q T,Th,Sa-HD   Continuous Infusions:   . sodium chloride 100 mL/hr at 05/21/11 0244   PRN Meds:.sodium chloride, sodium chloride, sodium chloride, acetaminophen, diphenhydrAMINE, feeding supplement (NEPRO CARB STEADY), guaiFENesin-dextromethorphan, heparin, heparin, heparin, lidocaine, lidocaine-prilocaine, loperamide, ondansetron (ZOFRAN) IV, ondansetron, pentafluoroprop-tetrafluoroeth, sodium chloride Assessment/Plan:  #. Severe Sepsis , resolving  Patient feels better and her symptoms are slightly improved.  She is hemodynamically stable.   The etiologies of severe sepsis are likely HCAP vs PCP Vs HD catheter infection vs endocarditis.   - Blood culture>>>1/2 Staph aureus, pending sensitivity  - for HCAP >>> Continue Vanco - for possible PCP>>>less likely to obtain sputum sample since she only has nonproductive cough.                                >>> add Bactrim IV at 5mg /kg/hour daily for PCP Tx. She will need the treatment for 21 days                                 >>> ABG shows PO2 82 ans Aa gradient of 8>>>no need for steroids for now.  - for endocarditis>>>we were able 2 sets of blood culture before ABX Tx. 1/2 staph aureus - Echo pending>>>? Endocarditis, fluctuate murmurs noted. See my physical exam.    # HCAP vs PCP PNA  ESRD with HD patient is found to  have Right side opacity>>>HCAP  Poorly controlled HIV due to medical noncompliance>>>ca not rule out PCP   - crypto antigen>>>Negative  -- Blood culture x 4 ( for work up of endocarditis)  - Vancomycin, bactrim see above note  - zosyn stopped based on Blood culture - symptomatic treatment for cough.   # Uremia syndrome , resolving  Patient presented with nausea, abdomen pain and generalized with weakness for one week which correlated with  Her missing 2 sessions of hemodialysis.  - HD per Renal team   # ESRD with HD  She has had a multiple sites infections upper grafts and access in the past. The vascular surgeons have stated that she has no further options for upper extremity dialysis access. She has had a left IJ Diatek cath placed by Dr. Oneida Alar on 12 /24/12.  She has missed 2 sessions of her dialysis this week due to generalized weakness and fatigue. Her potassium is 3.6, CO2 25, and CR 9.58 on admission.   # shock liver  Mild elevation of AST with ALP of 374 on admission. INR mild evlevated  ALP trending down [id number]  - continue to monitor.   # Diarrhea:  The etiology is unclear. DDX include Gastroenteritis vs C-diff  infection  - Negative C-diff PCR  - Probiotics  - imodium PRN   # Suprapubic pain  patient does not produce urine, thus she has no urinary symptoms. 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  Urine sample not enough for UA  - Urine culture pending   # Congestive cardiomyopathy  LV EF 25-30% Per TEE on 10/02/10  Patient is not volume overload on admission. Caution IVF replacement for sepsis and septic shock  -Continue to monitor   # HIV infection  This is a chronic problem. She does have a poor compliance with her medications. Her last CD4 220 and VL [id number] on 02/19/11.  - will continue her home medications  - CD4 130 and VL [id number]   #Thrombocytopenia  Chronic stable. Likely due to end-stage renal disease.  # Anemia of chronic disease, baseline 8-9  stable  # Mild Hypertension  Baseline SBP ranges at 140's. Not on any medical treatment.  # Malnutrition, likely due to renal disease  Albumin 1.5 on this admission  - Oral supplement  -Nutritionist consult  - albumin Tx given per renal  # VTE: Heparin      LOS: 3 days   Vantasia Pinkney 05/21/2011, 3:37 PM

## 2011-05-22 ENCOUNTER — Inpatient Hospital Stay (HOSPITAL_COMMUNITY): Payer: Medicaid Other

## 2011-05-22 DIAGNOSIS — N186 End stage renal disease: Secondary | ICD-10-CM

## 2011-05-22 DIAGNOSIS — E46 Unspecified protein-calorie malnutrition: Secondary | ICD-10-CM | POA: Diagnosis present

## 2011-05-22 LAB — COMPREHENSIVE METABOLIC PANEL
ALT: 13 U/L (ref 0–35)
AST: 42 U/L — ABNORMAL HIGH (ref 0–37)
Albumin: 1.2 g/dL — ABNORMAL LOW (ref 3.5–5.2)
Alkaline Phosphatase: 309 U/L — ABNORMAL HIGH (ref 39–117)
BUN: 19 mg/dL (ref 6–23)
CO2: 24 mEq/L (ref 19–32)
Calcium: 8.2 mg/dL — ABNORMAL LOW (ref 8.4–10.5)
Chloride: 99 mEq/L (ref 96–112)
Creatinine, Ser: 3.55 mg/dL — ABNORMAL HIGH (ref 0.50–1.10)
GFR calc Af Amer: 16 mL/min — ABNORMAL LOW (ref >90)
GFR calc non Af Amer: 14 mL/min — ABNORMAL LOW (ref >90)
Glucose, Bld: 100 mg/dL — ABNORMAL HIGH (ref 70–99)
Potassium: 4.4 mEq/L (ref 3.5–5.1)
Sodium: 130 mEq/L — ABNORMAL LOW (ref 135–145)
Total Bilirubin: 0.3 mg/dL (ref 0.3–1.2)
Total Protein: 8 g/dL (ref 6.0–8.3)

## 2011-05-22 LAB — CULTURE, BLOOD (ROUTINE X 2): Culture  Setup Time: 201305121732

## 2011-05-22 MED ORDER — ALBUMIN HUMAN 25 % IV SOLN
INTRAVENOUS | Status: AC
  Administered 2011-05-22: 12.5 g via INTRAVENOUS
  Filled 2011-05-22: qty 100

## 2011-05-22 MED ORDER — ALBUMIN HUMAN 25 % IV SOLN
12.5000 g | INTRAVENOUS | Status: DC | PRN
  Administered 2011-05-22 (×2): 12.5 g via INTRAVENOUS
  Filled 2011-05-22: qty 50

## 2011-05-22 MED ORDER — MIDODRINE HCL 5 MG PO TABS
ORAL_TABLET | ORAL | Status: AC
  Administered 2011-05-22: 10 mg via ORAL
  Filled 2011-05-22: qty 2

## 2011-05-22 MED ORDER — SULFAMETHOXAZOLE-TMP DS 800-160 MG PO TABS
2.0000 | ORAL_TABLET | Freq: Every day | ORAL | Status: DC
  Administered 2011-05-22 – 2011-05-29 (×8): 2 via ORAL
  Filled 2011-05-22 (×11): qty 2

## 2011-05-22 MED ORDER — CEFAZOLIN SODIUM-DEXTROSE 2-3 GM-% IV SOLR
2.0000 g | INTRAVENOUS | Status: DC
  Administered 2011-05-22 – 2011-05-24 (×2): 2 g via INTRAVENOUS
  Filled 2011-05-22 (×4): qty 50

## 2011-05-22 MED ORDER — DARBEPOETIN ALFA-POLYSORBATE 200 MCG/0.4ML IJ SOLN
INTRAMUSCULAR | Status: AC
  Administered 2011-05-22: 200 ug via INTRAVENOUS
  Filled 2011-05-22: qty 0.4

## 2011-05-22 MED ORDER — PARICALCITOL 5 MCG/ML IV SOLN
INTRAVENOUS | Status: AC
  Administered 2011-05-22: 8 ug via INTRAVENOUS
  Filled 2011-05-22: qty 2

## 2011-05-22 NOTE — Progress Notes (Signed)
Patient has left unit to hemodialysis, patient is stable________________________________D. Owens Shark RN

## 2011-05-22 NOTE — Progress Notes (Signed)
[facility]  Awaiting return from HD.  Pt tenatively scheduled for [facility] placement on Monday, May 20.  Adele Barthel, MD [facility] [place] Office: [phone] Pager: [phone]  05/22/2011, 2:38 PM

## 2011-05-22 NOTE — Progress Notes (Addendum)
Subjective: Patient states that she feels slightly better. mild nonproductive cough. No SOB or wheezing.  Still has loose stools. Denies nausea, vomiting and abdominal pain. Denies fever and chills.  Patient states that she still feels tired and does not have appetite. No acute events overnight  I have discussed with her about her disease process. She will talk to her sister "Enid Derry" about POA.  Objective: Vital signs in last 24 hours: Filed Vitals:   05/21/11 1122 05/21/11 1530 05/21/11 2217 05/22/11 0526  BP: 113/63 99/64 130/77 90/50  Pulse: 57 59 58 67  Temp:  98.7 F (37.1 C) 98.5 F (36.9 C) 98.4 F (36.9 C)  TempSrc:  Oral Oral Oral  Resp:  18 16 16   Height:      Weight:    122 lb 1.6 oz (55.384 kg)  SpO2:  98% 96% 93%   Weight change: -14.7 oz (-0.416 kg)  Intake/Output Summary (Last 24 hours) at 05/22/11 0846 Last data filed at 05/22/11 0600  Gross per 24 hour  Intake   1930 ml  Output      0 ml  Net   1930 ml   General: NAD. Cathectic and chronic ill appearing  Skin: No petechiae is noted on MM. No splinter hemorrhage, Janeway lesions or Osler nodes noted  Scattered skin abrasions on her back. Right FA a healing ulcer noted  Lungs: CTA B/L, left chest HD catheter.  Heart: RRR, 2/6 systolic at Aortic valve area. Left side lift/heave noted. 2-3 systolic murmurs noted on Apex and left sternal border. Abd: Soft, tenderness to palpation suprapubic area. BS x 4  MSK: Mild tenderness to palpation on bilateral plantar side of the feet. Ext: PPP+1, no edema.     Lab Results: Basic Metabolic Panel:  Lab [id number] 0515 05/20/11 1022 05/20/11 0605 05/19/11 0700  Harini Dearmond 135 -- 131* --  K 4.0 -- 3.7 --  CL 101 -- 97 --  CO2 28 -- 27 --  GLUCOSE 76 -- 86 --  BUN 9 -- 17 --  CREATININE 2.44* -- 3.89* --  CALCIUM 7.9* -- 7.9* --  MG -- -- -- --  PHOS -- 1.5* -- 3.5   Liver Function Tests:  Lab 05/21/11 0515 05/20/11 0605  AST 39* 33  ALT 11 10  ALKPHOS 329* 310*    BILITOT 0.5 0.7  PROT 7.5 7.6  ALBUMIN 1.2* 1.3*    Lab 05/18/11 1259  LIPASE 12  AMYLASE --   CBC:  Lab 05/20/11 1022 05/19/11 0500 05/18/11 1259  WBC 3.2* 3.3* --  NEUTROABS -- -- 2.5  HGB 8.8* 8.3* --  HCT 27.6* 25.5* --  MCV 92.3 91.4 --  PLT 103* 90* --   Cardiac Enzymes:  Lab 05/18/11 1702  CKTOTAL 114  CKMB 3.7  CKMBINDEX --  TROPONINI <0.30   CBG:  Lab 05/18/11 1745  GLUCAP 103*   Coagulation:  Lab 05/18/11 1708  LABPROT 19.3*  INR 1.60*     Micro Results: Recent Results (from the past 240 hour(s))  CULTURE, BLOOD (ROUTINE X 2)     Status: Normal   Collection Time   05/18/11  1:05 PM      Component Value Range Status Comment   Specimen Description BLOOD LEFT ARM   Final    Special Requests BOTTLES DRAWN AEROBIC AND ANAEROBIC 10CC   Final    Culture  Setup Time [id number]   Final    Culture     Final    Value: STAPHYLOCOCCUS  AUREUS     Note: RIFAMPIN AND GENTAMICIN SHOULD NOT BE USED AS SINGLE DRUGS FOR TREATMENT OF STAPH INFECTIONS.     STAPHYLOCOCCUS SPECIES (COAGULASE NEGATIVE)     Note: THE SIGNIFICANCE OF ISOLATING THIS ORGANISM FROM A SINGLE SET OF BLOOD CULTURES WHEN MULTIPLE SETS ARE DRAWN IS UNCERTAIN. PLEASE NOTIFY THE MICROBIOLOGY DEPARTMENT WITHIN ONE WEEK IF SPECIATION AND SENSITIVITIES ARE REQUIRED.     Note: Gram Stain Report Called to,Read Back By and Verified With: KELSEY WHITE @1916  ON 05/19/11 BY MCLET   Report Status 05/22/2011 FINAL   Final    Organism ID, Bacteria STAPHYLOCOCCUS AUREUS   Final   CULTURE, BLOOD (ROUTINE X 2)     Status: Normal (Preliminary result)   Collection Time   05/18/11  1:11 PM      Component Value Range Status Comment   Specimen Description BLOOD RIGHT ARM   Final    Special Requests BOTTLES DRAWN AEROBIC ONLY 3CC   Final    Culture  Setup Time [id number]   Final    Culture     Final    Value:        BLOOD CULTURE RECEIVED NO GROWTH TO DATE CULTURE WILL BE HELD FOR 5 DAYS BEFORE ISSUING A FINAL  NEGATIVE REPORT   Report Status PENDING   Incomplete   URINE CULTURE     Status: Normal   Collection Time   05/18/11  3:42 PM      Component Value Range Status Comment   Specimen Description URINE, CLEAN CATCH   Final    Special Requests NONE   Final    Culture  Setup Time [id number]   Final    Colony Count NO GROWTH   Final    Culture NO GROWTH   Final    Report Status 05/20/2011 FINAL   Final   CULTURE, BLOOD (ROUTINE X 2)     Status: Normal (Preliminary result)   Collection Time   05/18/11  5:08 PM      Component Value Range Status Comment   Specimen Description BLOOD LEFT ARM   Final    Special Requests BOTTLES DRAWN AEROBIC AND ANAEROBIC 10CC   Final    Culture  Setup Time [id number]   Final    Culture     Final    Value:        BLOOD CULTURE RECEIVED NO GROWTH TO DATE CULTURE WILL BE HELD FOR 5 DAYS BEFORE ISSUING A FINAL NEGATIVE REPORT   Report Status PENDING   Incomplete   CULTURE, BLOOD (ROUTINE X 2)     Status: Normal (Preliminary result)   Collection Time   05/18/11  5:23 PM      Component Value Range Status Comment   Specimen Description BLOOD RIGHT HAND   Final    Special Requests BOTTLES DRAWN AEROBIC AND ANAEROBIC 10CC   Final    Culture  Setup Time [id number]   Final    Culture     Final    Value:        BLOOD CULTURE RECEIVED NO GROWTH TO DATE CULTURE WILL BE HELD FOR 5 DAYS BEFORE ISSUING A FINAL NEGATIVE REPORT   Report Status PENDING   Incomplete   MRSA PCR SCREENING     Status: Normal   Collection Time   05/18/11  5:48 PM      Component Value Range Status Comment   MRSA by PCR NEGATIVE  NEGATIVE  Final   CLOSTRIDIUM DIFFICILE BY PCR  Status: Normal   Collection Time   05/20/11  5:29 AM      Component Value Range Status Comment   C difficile by pcr NEGATIVE  NEGATIVE  Final    Studies/Results: No results found. Medications: I have reviewed the patient's current medications. Scheduled Meds:   . acidophilus  1 capsule Oral Daily  . antiseptic  oral rinse  15 mL Mouth Rinse BID  . aspirin  81 mg Oral Daily  . darbepoetin (ARANESP) injection - DIALYSIS  200 mcg Intravenous Q Thu-HD  . darunavir  800 mg Oral Q breakfast  . famotidine  20 mg Oral Daily  . feeding supplement  1 Container Oral TID BM  . feeding supplement (NEPRO CARB STEADY)  237 mL Oral BID BM  . heparin  5,000 Units Subcutaneous Q8H  . lamiVUDine  25 mg Oral Daily  . loperamide  4 mg Oral Once  . midodrine  10 mg Oral TID WC  . multivitamin  1 tablet Oral Daily  . paricalcitol  8 mcg Intravenous Q T,Th,Sa-HD  . ritonavir  100 mg Oral Q breakfast  . sodium chloride  25 mL/kg Intravenous Once  . sodium chloride  3 mL Intravenous Q12H  . sodium chloride  3 mL Intravenous Q12H  . stavudine  20 mg Oral Daily  . sulfamethoxazole-trimethoprim  320 mg Intravenous Q24H  . vancomycin  500 mg Intravenous Q T,Th,Sa-HD  . DISCONTD: paricalcitol  10 mcg Intravenous Q T,Th,Sa-HD  . DISCONTD: piperacillin-tazobactam (ZOSYN)  IV  2.25 g Intravenous Q8H   Continuous Infusions:   . sodium chloride 100 mL/hr at 05/22/11 0347   PRN Meds:.sodium chloride, sodium chloride, sodium chloride, acetaminophen, diphenhydrAMINE, feeding supplement (NEPRO CARB STEADY), guaiFENesin-dextromethorphan, heparin, heparin, heparin, lidocaine, lidocaine-prilocaine, loperamide, ondansetron (ZOFRAN) IV, ondansetron, pentafluoroprop-tetrafluoroeth, sodium chloride Assessment/Plan:  Disposition  Overall poor prognosis due to poorly controlled HIV and severe infections including endocarditis. Likely SNF at discharge.  #. Severe Sepsis , resolving  Patient feels better and her symptoms are slightly improved. She is hemodynamically stable.  The etiologies of severe sepsis are likely HCAP vs PCP Vs HD catheter infection vs endocarditis.   - Blood culture>>>1/2 Staph aureus>>>Vancomycin - for HCAP >>> Continue Vanco  - for possible PCP>>>less likely to obtain sputum sample since she only has  nonproductive cough.                               >>> add Bactrim IV at 5mg /kg/hour daily for PCP Tx. She will need the treatment for 21 days                               >>> ABG shows PO2 82 ans Aa gradient of 8>>>no need for steroids for now.   #Bacterremia and bacterial endocarditis       Staph aureus in 1/2 blood culture and two majors and two minors>>>confirmed   The source of her bacteremia and bacterial endocarditis is likely her left chest HD catheter, especially her catheter was very dirty and dry food particles were noted on the catheter on admission.  I have discussed with Nephrologist Dr. Melvia Heaps who will contact [facility] for catheter removal and replacement.  - Vancomycin - will need 6 weeks tx, especially given her poorly controlled HIV.   # HCAP vs PCP PNA  ESRD with HD patient is found to have B/L opacity with R>L  on chest ray>>>HCAP  Poorly controlled HIV due to medical noncompliance>>>ca not rule out PCP   - crypto antigen>>>Negative  -- Blood culture x 4 ( for work up of endocarditis)  - Vancomycin, bactrim see above note  - zosyn stopped based on Blood culture  - symptomatic treatment for cough.   # Uremia syndrome , resolving  Patient presented with nausea, abdomen pain and generalized with weakness for one week which correlated with  Her missing 2 sessions of hemodialysis.  - HD per Renal team   # ESRD with HD  She has had a multiple sites infections upper grafts and access in the past. The vascular surgeons have stated that she has no further options for upper extremity dialysis access. She has had a left IJ Diatek cath placed by Dr. Oneida Alar on 12 /24/12.  She has missed 2 sessions of her dialysis this week due to generalized weakness and fatigue. Her potassium is 3.6, CO2 25, and CR 9.58 on admission.   # shock liver  Mild elevation of AST with ALP of 374 on admission. INR mild evlevated  ALP trending down [phone]  - continue to monitor.   # Diarrhea:  The  etiology is unclear. DDX include Gastroenteritis vs C-diff infection  - Negative C-diff PCR  - Probiotics  - imodium PRN   # Suprapubic pain , resolved likely related to her uremia syndrome  - Urine culture Negative  # Congestive cardiomyopathy  LV EF 25-30% Per TEE on 10/02/10  Patient is not volume overload on admission. Caution IVF replacement for sepsis and septic shock  -Continue to monitor  - of note, she has been on gentle fluid replacement during her ICU stay due to sepsis and decreased oral intake. She is stable now. Her IVF is stopped.  # HIV infection  This is a chronic problem. She does have a poor compliance with her medications. Her last CD4 220 and VL [id number] on 02/19/11.  - will continue her home medications  - CD4 130 and VL [id number]   #Thrombocytopenia  Chronic stable. Likely due to end-stage renal disease.   # Anemia of chronic disease, baseline 8-9  stable   # Mild Hypertension  Baseline SBP ranges at 140's. Not on any medical treatment.   # Malnutrition, likely due to renal disease  Albumin 1.5 on this admission  - Oral supplement  -Nutritionist consult  - albumin Tx given per renal   # VTE: Heparin    LOS: 4 days   Hermilo Dutter 05/22/2011, 8:46 AM

## 2011-05-22 NOTE — Progress Notes (Signed)
[facility] Catheter Removal Procedure Note  Diagnosis: ESRD with infected left IJ catheter which was placed by Dr. Oneida Alar  Plan:  Remove left diatek catheter  Consent signed:  yes Time out completed:  yes Coumadin:  no PT/INR (if applicable):   Other labs:   Procedure: 1.  Sterile prepping and draping over catheter area 2. 8 ml 2% lidocaine plain instilled at removal site. 3.  left catheter removed in its entirety with cuff in tact. 4.  Complications: none  5. Tip of catheter sent for culture:  yes   Patient tolerated procedure well:  yes Pressure held, no bleeding noted, dressing applied Instructions given to the pt regarding wound care and bleeding.  Other: Plan [facility] catheter Monday  [place] J 05/22/2011 6:33 PM

## 2011-05-22 NOTE — Progress Notes (Signed)
PT Cancellation Note  Treatment cancelled today due to patient receiving procedure or test.Pt off the floor for HD. Will f/u tomorrow. Can we get the strict bed rest order d/c'd?  [facility] HELEN 05/22/2011, 11:50 AM Pager: [phone]

## 2011-05-22 NOTE — Progress Notes (Signed)
Subjective:   Feeling better, still nonprod cough.  Blood cx now + (1/4) for S. Aureus in addition to CNSS, and ECHO last night showing possible vegetation on MV.   Objective: Vital signs in last 24 hours: Temp:  [98.4 F (36.9 C)-98.9 F (37.2 C)] 98.9 F (37.2 C) (05/16 0943) Pulse Rate:  [58-77] 77  (05/16 0943) Resp:  [16-18] 18  (05/16 0943) BP: (90-130)/(50-77) 96/60 mmHg (05/16 0943) SpO2:  [93 %-98 %] 98 % (05/16 0943) Weight:  [55.384 kg (122 lb 1.6 oz)] 55.384 kg (122 lb 1.6 oz) (05/16 0526) Weight change: -0.416 kg (-14.7 oz)  Intake/Output from previous day: 05/15 0701 - 05/16 0700 In: 1980 [P.O.:360; I.V.:1000; IV Piggyback:620] Out: 0  Total I/O In: 363 [P.O.:360; I.V.:3] Out: 1 [Stool:1] EXAM: General appearance:  Chronically ill, alert, in no apparent distress  Resp:  Decreased breath sounds, but CTA bilaterally Cardio:  RRR with gr II/VI systolic murmur GI:  + BS, soft and nontender Extremities:  Trace edema Access:  Left IJ catheter with BFR 400 cc/min  Lab Results:  Basename 05/20/11 1022  WBC 3.2*  HGB 8.8*  HCT 27.6*  PLT 103*   BMET:   Basename 05/22/11 0900 05/21/11 0515  NA 130* 135  K 4.4 4.0  CL 99 101  CO2 24 28  GLUCOSE 100* 76  BUN 19 9  CREATININE 3.55* 2.44*  CALCIUM 8.2* 7.9*  ALBUMIN 1.2* 1.2*   Out[facility] HD: [facility], 4 hrs, 56 kg EDW. EPO 10,4000. Zemplar 8ug. No IV Fe (d/c'd in April). L IJ TDC.   Assessment/Plan 1. Sepsis/Staph bacteremia/MV possible vegetation- course c/w sepsis and possible endocarditis, which in pt with tunnelled HD cath is likely due to catheter infection. Also has PNA.  Talked with primary service, they would like catheter removed. Agree, I've asked [facility] to see pt for catheter removal after HD today or in am. On IV Vancomycin and Zosyn. Will try to get her thru til Monday without HD and she will need a new tunnelled cath on Monday then.  2. ESRD / TTS / [place]- missed on 5/9 and 5/11.  HD today, no sig  UF, she is below dry weight. 3. Chronic hypotension/Volume - BP 101/64, on Midodrine as outpatient due to chronic hypotension issues. Continue. 4. Anemia - Hgb 8.3 on outpatient Epogen 10,400 U per HD and weekly Venofer.  Aranesp 200 mcg here, and weekly IV Fe. 5. Metabolic bone disease - Ca 7.9 (10.1 corrected), P 3.5 on Zemplar 8 mcg per HD. Will continue. HD records show phoslo 1ac and tums 3ac with meals. Will d/w pt if she was taking both. Phos low here 1.5, so binders on hold. Ca 7.9, adj 10.1.  6. Nutrition - Alb 1.2, on Megace as outpatient. 7. HIV - on antiviral meds, followed by ID. Viral load extremely high (1.5 million)  Kelly Splinter  MD [facility] [phone] pgr    [phone] cell 05/22/2011, 11:41 AM

## 2011-05-22 NOTE — Progress Notes (Signed)
Clinical Social Work-CSW attempted to assess pt on multiple occasions-most recently pt in HD-CSW left [facility] Re[facility] list in room with contact information and will re-visit in AM- [name]-LCSW-[phone]

## 2011-05-22 NOTE — Progress Notes (Signed)
Patient has returned to the unit from hemodialysis, report received from HD nurse____________D. Owens Shark RN

## 2011-05-22 NOTE — Procedures (Signed)
I was present at this dialysis session. I have reviewed the session itself and made appropriate changes.   Kelly Splinter, MD [employer] 05/22/2011, 3:51 PM

## 2011-05-23 LAB — COMPREHENSIVE METABOLIC PANEL
ALT: 10 U/L (ref 0–35)
AST: 40 U/L — ABNORMAL HIGH (ref 0–37)
Albumin: 1.5 g/dL — ABNORMAL LOW (ref 3.5–5.2)
Alkaline Phosphatase: 276 U/L — ABNORMAL HIGH (ref 39–117)
BUN: 9 mg/dL (ref 6–23)
CO2: 28 mEq/L (ref 19–32)
Calcium: 8.3 mg/dL — ABNORMAL LOW (ref 8.4–10.5)
Chloride: 99 mEq/L (ref 96–112)
Creatinine, Ser: 2.23 mg/dL — ABNORMAL HIGH (ref 0.50–1.10)
GFR calc Af Amer: 28 mL/min — ABNORMAL LOW (ref >90)
GFR calc non Af Amer: 24 mL/min — ABNORMAL LOW (ref >90)
Glucose, Bld: 91 mg/dL (ref 70–99)
Potassium: 3.6 mEq/L (ref 3.5–5.1)
Sodium: 133 mEq/L — ABNORMAL LOW (ref 135–145)
Total Bilirubin: 0.3 mg/dL (ref 0.3–1.2)
Total Protein: 7.8 g/dL (ref 6.0–8.3)

## 2011-05-23 LAB — EXPECTORATED SPUTUM ASSESSMENT W GRAM STAIN, RFLX TO RESP C

## 2011-05-23 LAB — CLOSTRIDIUM DIFFICILE BY PCR: Toxigenic C. Difficile by PCR: NEGATIVE

## 2011-05-23 LAB — EXPECTORATED SPUTUM ASSESSMENT W REFEX TO RESP CULTURE

## 2011-05-23 MED ORDER — BISMUTH SUBSALICYLATE 262 MG PO CHEW
524.0000 mg | CHEWABLE_TABLET | ORAL | Status: DC | PRN
Start: 2011-05-23 — End: 2011-05-23
  Filled 2011-05-23: qty 2

## 2011-05-23 MED ORDER — BISMUTH SUBSALICYLATE 262 MG/15ML PO SUSP
30.0000 mL | ORAL | Status: DC | PRN
  Administered 2011-05-23 (×2): 30 mL via ORAL

## 2011-05-23 NOTE — Progress Notes (Addendum)
Clinical Social Work Department CLINICAL SOCIAL WORK PLACEMENT NOTE 05/23/2011  Patient:  [name], [name]  Account Number:  [id number] Admit date:  05/18/2011  Clinical Social Worker:  Reece Packer, LCSW  Date/time:  05/23/2011 02:00 PM  Clinical Social Work is seeking post-discharge placement for this patient at the following level of care:   [place]   (*CSW will update this form in Epic as items are completed)   05/23/2011  Patient/family provided with [place] Department of Clinical Social Work's list of facilities offering this level of care within the geographic area requested by the patient (or if unable, by the patient's family).  05/23/2011  Patient/family informed of their freedom to choose among providers that offer the needed level of care, that participate in Medicare, Medicaid or managed care program needed by the patient, have an available bed and are willing to accept the patient.  05/23/2011  Patient/family informed of [facility]' ownership interest in [facility], as well as of the fact that they are under no obligation to receive care at this facility.  PASARR submitted to EDS on 05/23/2011 PASARR number received from EDS on   FL2 transmitted to all facilities in geographic area requested by pt/family on  05/23/2011 FL2 transmitted to all facilities within larger geographic area on 05/25/11-[name] and   Patient informed that his/her managed care company has contracts with or will negotiate with  certain facilities, including the following:     Patient/family informed of bed offers received:   Patient chooses bed at  Physician recommends and patient chooses bed at    Patient to be transferred to  on   Patient to be transferred to facility by   The following physician request were entered in Epic:   Additional Comments:

## 2011-05-23 NOTE — Evaluation (Signed)
Physical Therapy Evaluation and Discharge Patient Details Name: [name] MRN: [id number] DOB: 01/15/1955 Today's Date: 05/23/2011 Time: [id number] PT Time Calculation (min): 18 min  PT Assessment / Plan / Recommendation Clinical Impression  Pt is a 56 y/o female admitted with sepsis. Pt live with daughter in single story apartment.  Pt appears to be functioning at her baseline level of function. (Modified Independent with RW).  Acute PT signing off.     PT Assessment  Patent does not need any further PT services    Follow Up Recommendations  No PT follow up    Barriers to Discharge        lEquipment Recommendations  None recommended by PT (Pt has RW, Wheelchair, 3 in1 and shower chair.  )    Recommendations for Other Services     Frequency      Precautions / Restrictions Precautions Precautions: Fall Restrictions Weight Bearing Restrictions: No   Pertinent Vitals/Pain Pt c/o abdominal pain 6/10.  Pre-medicated       Mobility  Bed Mobility Bed Mobility: Supine to Sit;Sitting - Scoot to Edge of Bed Supine to Sit: 7: Independent Sitting - Scoot to [employer] of Bed: 5: Supervision Details for Bed Mobility Assistance: supervision for safety Transfers Transfers: Sit to Stand;Stand to Sit Sit to Stand: From bed;5: Supervision;With upper extremity assist Stand to Sit: 6: Modified independent (Device/Increase time);To chair/3-in-1 Details for Transfer Assistance: Verbal cues for hand placement on bed.  Supervision for safety.   Demonstrated safe technique for stand to sit transfer without instruction.  Ambulation/Gait Ambulation/Gait Assistance: 6: Modified independent (Device/Increase time);5: Supervision Ambulation Distance (Feet): 80 Feet Assistive device: Rolling walker Ambulation/Gait Assistance Details: Verbal cues to stay within walker.   Gait Pattern: Step-through pattern;Decreased stride length Gait velocity: WFL Stairs: No Wheelchair Mobility Wheelchair Mobility:  No Modified Rankin (Stroke Patients Only) Pre-Morbid Rankin Score: Moderate disability    Exercises     PT Diagnosis:    PT Problem List:   PT Treatment Interventions:     PT Goals    Visit Information  Last PT Received On: 05/23/11 Assistance Needed: +1    Subjective Data  Subjective: agree to PT eval Patient Stated Goal: be able to get around   Prior [place] Lives With: Daughter Available Help at Discharge: Family Type of Home: Apartment Home Access: Ramped entrance Home Layout: One level Bathroom Shower/Tub: Walk-in Sales promotion account executive: Standard Bathroom Accessibility: Yes How Accessible: Accessible via walker Home Adaptive Equipment: Bedside commode/3-in-1;Shower chair with back;Walker - rolling;Wheelchair - manual Prior Function Level of Independence: Independent with assistive device(s) Able to Take Stairs?: No Driving: No Vocation: Retired 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 difficulties Dominant Hand: Right    Cognition  Overall Cognitive Status: Appears within functional limits for tasks assessed/performed Arousal/Alertness: Awake/alert Orientation Level: Appears intact for tasks assessed;Oriented X4 / Intact Behavior During Session: [facility] for tasks performed    Extremity/Trunk Assessment Right Upper Extremity Assessment RUE ROM/Strength/Tone: Within functional levels Left Upper Extremity Assessment LUE ROM/Strength/Tone: Within functional levels Right Lower Extremity Assessment RLE ROM/Strength/Tone: WFL for tasks assessed Left Lower Extremity Assessment LLE ROM/Strength/Tone: WFL for tasks assessed Trunk Assessment Trunk Assessment: Normal   Balance Balance Balance Assessed: No  End of Session PT - End of Session Equipment Utilized During Treatment: Gait belt Activity Tolerance: Patient tolerated treatment well Patient left: in chair;with call bell/phone within reach;Other (comment) (Instrcted pt to have RN supervise all  mobility for safety) Nurse Communication: Mobility status   Triniti Gruetzmacher 05/23/2011, 9:50  AM Collier Salina L. Laronda Lisby DPT [phone]

## 2011-05-23 NOTE — Progress Notes (Signed)
Subjective: Patient states that she feels slightly better but still tired. C/o mild nonproductive cough better than before. No SOB or wheezing.  Still has loose stools. Denies nausea, vomiting and abdominal pain. Denies fever and chills.  She remains afebrile.  Objective: Vital signs in last 24 hours: Filed Vitals:   05/23/11 0622 05/23/11 0957 05/23/11 1300 05/23/11 1639  BP: 105/59 86/49 98/54  92/50  Pulse: 63 64 66 63  Temp: 99.9 F (37.7 C) 98.9 F (37.2 C) 98.3 F (36.8 C)   TempSrc: Oral Oral    Resp: 16 18 18 18   Height:      Weight: 123 lb 4.8 oz (55.929 kg)     SpO2: 97% 100% 100% 95%   Weight change: 1 lb 12.8 oz (0.816 kg)  Intake/Output Summary (Last 24 hours) at 05/23/11 1706 Last data filed at 05/23/11 1337  Gross per 24 hour  Intake    963 ml  Output      0 ml  Net    963 ml   General: NAD. Cathectic and chronic ill appearing  Skin: No petechiae is noted on MM. No splinter hemorrhage, [place] lesions or Osler nodes noted  Scattered skin abrasions on her back. Right FA a healing ulcer noted  Lungs: CTA B/L, left chest HD catheter.  Heart: RRR, 2/6 systolic at Aortic valve area. Left side lift/heave noted. 2-3 systolic murmurs noted on Apex and left sternal border.  Abd: Soft, tenderness to palpation suprapubic area. BS x 4  MSK: Mild tenderness to palpation on bilateral plantar side of the feet. Ext: PPP+1, no edema.   Lab Results: Basic Metabolic Panel:  Lab 0000000 0550 05/22/11 0900 05/20/11 1022 05/19/11 0700  Gilmer Kaminsky 133* 130* -- --  K 3.6 4.4 -- --  CL 99 99 -- --  CO2 28 24 -- --  GLUCOSE 91 100* -- --  BUN 9 19 -- --  CREATININE 2.23* 3.55* -- --  CALCIUM 8.3* 8.2* -- --  MG -- -- -- --  PHOS -- -- 1.5* 3.5   Liver Function Tests:  Lab 05/23/11 0550 05/22/11 0900  AST 40* 42*  ALT 10 13  ALKPHOS 276* 309*  BILITOT 0.3 0.3  PROT 7.8 8.0  ALBUMIN 1.5* 1.2*    Lab 05/18/11 1259  LIPASE 12  AMYLASE --   CBC:  Lab 05/20/11 1022  05/19/11 0500 05/18/11 1259  WBC 3.2* 3.3* --  NEUTROABS -- -- 2.5  HGB 8.8* 8.3* --  HCT 27.6* 25.5* --  MCV 92.3 91.4 --  PLT 103* 90* --   Cardiac Enzymes:  Lab 05/18/11 1702  CKTOTAL 114  CKMB 3.7  CKMBINDEX --  TROPONINI <0.30   CBG:  Lab 05/18/11 1745  GLUCAP 103*   Coagulation:  Lab 05/18/11 1708  LABPROT 19.3*  INR 1.60*    Micro Results: Recent Results (from the past 240 hour(s))  CULTURE, BLOOD (ROUTINE X 2)     Status: Normal   Collection Time   05/18/11  1:05 PM      Component Value Range Status Comment   Specimen Description BLOOD LEFT ARM   Final    Special Requests BOTTLES DRAWN AEROBIC AND ANAEROBIC 10CC   Final    Culture  Setup Time [id number]   Final    Culture     Final    Value: STAPHYLOCOCCUS AUREUS     Note: RIFAMPIN AND GENTAMICIN SHOULD NOT BE USED AS SINGLE DRUGS FOR TREATMENT OF STAPH INFECTIONS.  STAPHYLOCOCCUS SPECIES (COAGULASE NEGATIVE)     Note: THE SIGNIFICANCE OF ISOLATING THIS ORGANISM FROM A SINGLE SET OF BLOOD CULTURES WHEN MULTIPLE SETS ARE DRAWN IS UNCERTAIN. PLEASE NOTIFY THE MICROBIOLOGY DEPARTMENT WITHIN ONE WEEK IF SPECIATION AND SENSITIVITIES ARE REQUIRED.     Note: Gram Stain Report Called to,Read Back By and Verified With: KELSEY WHITE @1916  ON 05/19/11 BY MCLET   Report Status 05/22/2011 FINAL   Final    Organism ID, Bacteria STAPHYLOCOCCUS AUREUS   Final   CULTURE, BLOOD (ROUTINE X 2)     Status: Normal (Preliminary result)   Collection Time   05/18/11  1:11 PM      Component Value Range Status Comment   Specimen Description BLOOD RIGHT ARM   Final    Special Requests BOTTLES DRAWN AEROBIC ONLY 3CC   Final    Culture  Setup Time [id number]   Final    Culture     Final    Value:        BLOOD CULTURE RECEIVED NO GROWTH TO DATE CULTURE WILL BE HELD FOR 5 DAYS BEFORE ISSUING A FINAL NEGATIVE REPORT   Report Status PENDING   Incomplete   URINE CULTURE     Status: Normal   Collection Time   05/18/11  3:42 PM       Component Value Range Status Comment   Specimen Description URINE, CLEAN CATCH   Final    Special Requests NONE   Final    Culture  Setup Time [id number]   Final    Colony Count NO GROWTH   Final    Culture NO GROWTH   Final    Report Status 05/20/2011 FINAL   Final   CULTURE, BLOOD (ROUTINE X 2)     Status: Normal (Preliminary result)   Collection Time   05/18/11  5:08 PM      Component Value Range Status Comment   Specimen Description BLOOD LEFT ARM   Final    Special Requests BOTTLES DRAWN AEROBIC AND ANAEROBIC 10CC   Final    Culture  Setup Time [id number]   Final    Culture     Final    Value:        BLOOD CULTURE RECEIVED NO GROWTH TO DATE CULTURE WILL BE HELD FOR 5 DAYS BEFORE ISSUING A FINAL NEGATIVE REPORT   Report Status PENDING   Incomplete   CULTURE, BLOOD (ROUTINE X 2)     Status: Normal (Preliminary result)   Collection Time   05/18/11  5:23 PM      Component Value Range Status Comment   Specimen Description BLOOD RIGHT HAND   Final    Special Requests BOTTLES DRAWN AEROBIC AND ANAEROBIC 10CC   Final    Culture  Setup Time [id number]   Final    Culture     Final    Value:        BLOOD CULTURE RECEIVED NO GROWTH TO DATE CULTURE WILL BE HELD FOR 5 DAYS BEFORE ISSUING A FINAL NEGATIVE REPORT   Report Status PENDING   Incomplete   MRSA PCR SCREENING     Status: Normal   Collection Time   05/18/11  5:48 PM      Component Value Range Status Comment   MRSA by PCR NEGATIVE  NEGATIVE  Final   CLOSTRIDIUM DIFFICILE BY PCR     Status: Normal   Collection Time   05/20/11  5:29 AM      Component Value Range Status  Comment   C difficile by pcr NEGATIVE  NEGATIVE  Final   CATH TIP CULTURE     Status: Normal (Preliminary result)   Collection Time   05/22/11  6:28 PM      Component Value Range Status Comment   Specimen Description CATH TIP CHEST LEFT   Final    Special Requests NONE   Final    Culture NO GROWTH   Final    Report Status PENDING   Incomplete   CULTURE,  EXPECTORATED SPUTUM-ASSESSMENT     Status: Normal   Collection Time   05/23/11  8:10 AM      Component Value Range Status Comment   Specimen Description SPUTUM   Final    Special Requests EXPECT. SPU   Final    Sputum evaluation     Final    Value: THIS SPECIMEN IS ACCEPTABLE. RESPIRATORY CULTURE REPORT TO FOLLOW.   Report Status 05/23/2011 FINAL   Final    Studies/Results: No results found. Medications: I have reviewed the patient's current medications. Scheduled Meds:   . acidophilus  1 capsule Oral Daily  . antiseptic oral rinse  15 mL Mouth Rinse BID  . aspirin  81 mg Oral Daily  .  ceFAZolin (ANCEF) IV  2 g Intravenous Q T,Th,Sat-1800  . darbepoetin (ARANESP) injection - DIALYSIS  200 mcg Intravenous Q Thu-HD  . darunavir  800 mg Oral Q breakfast  . famotidine  20 mg Oral Daily  . feeding supplement  1 Container Oral TID BM  . feeding supplement (NEPRO CARB STEADY)  237 mL Oral BID BM  . heparin  5,000 Units Subcutaneous Q8H  . lamiVUDine  25 mg Oral Daily  . midodrine  10 mg Oral TID WC  . multivitamin  1 tablet Oral Daily  . paricalcitol  8 mcg Intravenous Q T,Th,Sa-HD  . ritonavir  100 mg Oral Q breakfast  . sodium chloride  25 mL/kg Intravenous Once  . sodium chloride  3 mL Intravenous Q12H  . sodium chloride  3 mL Intravenous Q12H  . stavudine  20 mg Oral Daily  . sulfamethoxazole-trimethoprim  2 tablet Oral QHS   Continuous Infusions:  PRN Meds:.sodium chloride, acetaminophen, albumin human, diphenhydrAMINE, feeding supplement (NEPRO CARB STEADY), guaiFENesin-dextromethorphan, lidocaine, lidocaine-prilocaine, loperamide, ondansetron (ZOFRAN) IV, ondansetron, pentafluoroprop-tetrafluoroeth, sodium chloride, DISCONTD: sodium chloride, DISCONTD: sodium chloride, DISCONTD: bismuth subsalicylate, DISCONTD: bismuth subsalicylate, DISCONTD: heparin, DISCONTD: heparin, DISCONTD: heparin Assessment/Plan:  Disposition Overall poor prognosis due to poorly controlled HIV and  severe infections including endocarditis. SNF at discharge.                       She will be likely ready for discharge early next week.  She will be discharged with IV Ancef and po Bactrim.  #. Severe Sepsis , resolving  Patient feels better and her symptoms are slightly improved. She is hemodynamically stable.  The etiologies of severe sepsis are likely HCAP vs PCP Vs HD catheter infection vs endocarditis.   - Blood culture>>>1/2 Staph aureus>>> vancomycin is switched to Ancef with HD since Ancef has better bactericidal effect after discussion with the pharmacist. - for HCAP >>> Ancef                - for possible PCP>>>less likely to obtain sputum sample since she only has nonproductive cough.                               >>>  Bactrim po at 5mg /kg/hour daily for PCP Tx. She will need the treatment for 21 days                               >>> ABG shows PO2 82 ans Aa gradient of 8>>>no need for steroids for now.   #Bacterremia and bacterial endocarditis  Staph aureus in 1/2 blood culture and two majors and two minors>>>confirmed  The source of her bacteremia and bacterial endocarditis is likely her left chest HD catheter, especially her catheter was very dirty and dry food particles were noted on the catheter on admission.   -HD catheter removed on 05/22/11.  New HD catheter on Monday 05/26/11 - Will repeat blood culture x2 since the catheter is removed - Ancef IV which HD - will need 6 weeks tx, especially given her poorly controlled HIV.   # HCAP vs PCP PNA , improving ESRD with HD patient is found to have B/L opacity with R>L on chest ray>>>HCAP  Poorly controlled HIV due to medical noncompliance>>>ca not rule out PCP   - crypto antigen>>>Negative  - Ancef, bactrim see above note   - symptomatic treatment for cough.   # Uremia syndrome , resolved  # ESRD with HD,per renal  # shock liver, improving   - continue to monitor.   # Diarrhea:   - Negative C-diff PCR  -  Probiotics  - imodium PRN   # Suprapubic pain , resolved  likely related to her uremia syndrome  - Urine culture Negative   # Congestive cardiomyopathy  LV EF 25-30% Per TEE on 10/02/10  Patient is not volume overload on admission. Caution IVF replacement for sepsis and septic shock  -Continue to monitor   # HIV infection  This is a chronic problem. She does have a poor compliance with her medications. Her last CD4 220 and VL [id number] on 02/19/11.  - will continue her home medications  - CD4 130 and VL [id number]   #Thrombocytopenia  Chronic stable. Likely due to end-stage renal disease.   # Anemia of chronic disease, baseline 8-9  Stable  - On Procrit -Weekly Venofer  # Mild Hypertension  Baseline SBP ranges at 140's. Not on any medical treatment .  # Malnutrition, likely due to renal disease  Albumin 1.5 on this admission  - Oral supplement  -Nutritionist consult  - albumin Tx given per renal  -Will not continue Megace since it increase risk of thrombosis especially patient is on Procrit.   # VTE: Heparin      LOS: 5 days   Dortha Neighbors 05/23/2011, 5:06 PM

## 2011-05-23 NOTE — Progress Notes (Signed)
[facility] Progress Note  Subjective: Can't eat salad; no teeth. Will try to eat strawberries with angel food cake and ensure  Objective Filed Vitals:   05/22/11 1634 05/22/11 2205 05/23/11 0622 05/23/11 0957  BP: 105/60 91/50 105/59 86/49  Pulse: 54 69 63 64  Temp: 98.3 F (36.8 C) 99.5 F (37.5 C) 99.9 F (37.7 C) 98.9 F (37.2 C)  TempSrc: Oral Oral Oral Oral  Resp: 21 16 16 18   Height:      Weight: 55.9 kg (123 lb 3.8 oz)  55.929 kg (123 lb 4.8 oz)   SpO2: 94% 100% 97% 100%   Physical Exam General: comfortable; no teeth (she has lost weight Since I last saw her) Heart: RRR 2/6 murmur Lungs: no rales or rhochi appreciated Abdomen: soft NT Extremities: muscle wasting; tr LE edema Dialysis Access: none at present; left neck dressing in place  [facility], 4 hrs, 56 kg EDW. EPO 10,4000. Zemplar 8ug. No IV Fe (d/c'd in April). L IJ  Assessment/Plan: 1. Sepsis/1/4 +MSSA  Plus CNSS bacteremia/MV possible vegetation- course c/w sepsis and possible endocarditis, w tunnelled HD cath is likely source of infection. Also has PNA. Catheter removed 5/16.  IV antibiotics changed to Ancef. 2. [facility]D / [facility] / [facility]- HD,at [facility]W dry weight; cathteter removed post HD 5/16 - plan next HD Monday after new catheter placement; follow chemistries. Regular diet high risk for ^ K, but intake is poor at present. 3. Chronic hypotension/Volume -  on Midodrine due to chronic hypotension issues.  4. Anemia - Hgb 8.8 5/14 on outpatient Epogen 10,400 U per HD and weekly Venofer. Aranesp 200 mcg here, and weekly IV Fe; recheck in am. 5. Metabolic bone disease - Ca 8.3 (10.6 corrected), P 1.5 5/14 on Zemplar 8 mcg per HD. Will continue. HD records show phoslo 1ac and tums 3ac with meals. Binders on hold for now; changed am lab to renal to check P level 6. Nutrition - Alb 1.5, on Megace as outpatient.- diet liberalized to regular - agree - needs foods easy to chew 7. HIV/AIDS - on  antiviral meds + bactrim followed by ID. Viral load extremely high (1.5 million)  Additional Objective Labs: Basic Metabolic Panel:  Lab 0000000 0550 05/22/11 0900 05/21/11 0515 05/20/11 1022 05/19/11 0700  NA 133* 130* 135 -- --  K 3.6 4.4 4.0 -- --  CL 99 99 101 -- --  CO2 28 24 28  -- --  GLUCOSE 91 100* 76 -- --  BUN 9 19 9  -- --  CREATININE 2.23* 3.55* 2.44* -- --  CALCIUM 8.3* 8.2* 7.9* -- --  ALB -- -- -- -- --  PHOS -- -- -- 1.5* 3.5   Liver Function Tests:  Lab 05/23/11 0550 05/22/11 0900 05/21/11 0515  AST 40* 42* 39*  ALT 10 13 11   ALKPHOS 276* 309* 329*  BILITOT 0.3 0.3 0.5  PROT 7.8 8.0 7.5  ALBUMIN 1.5* 1.2* 1.2*    Lab 05/18/11 1259  LIPASE 12  AMYLASE --   CBC:  Lab 05/20/11 1022 05/19/11 0500 05/18/11 1708 05/18/11 1259  WBC 3.2* 3.3* 4.6 --  NEUTROABS -- -- -- 2.5  HGB 8.8* 8.3* 8.1* --  HCT 27.6* 25.5* 24.7* --  MCV 92.3 91.4 91.5 91.7  PLT 103* 90* 94* --   Blood Culture    Component Value Date/Time   SDES SPUTUM 05/23/2011 0810   SPECREQUEST EXPECT. SPU 05/23/2011 0810   CULT NO GROWTH 05/22/2011 1828  REPTSTATUS 05/23/2011 FINAL 05/23/2011 0810    Cardiac Enzymes:  Lab 05/18/11 1702  CKTOTAL 114  CKMB 3.7  CKMBINDEX --  TROPONINI <0.30   CBG:  Lab 05/18/11 1745  GLUCAP 103*  Medications:      . acidophilus  1 capsule Oral Daily  . antiseptic oral rinse  15 mL Mouth Rinse BID  . aspirin  81 mg Oral Daily  .  ceFAZolin (ANCEF) IV  2 g Intravenous Q T,Th,Sat-1800  . darbepoetin (ARANESP) injection - DIALYSIS  200 mcg Intravenous Q Thu-HD  . darunavir  800 mg Oral Q breakfast  . famotidine  20 mg Oral Daily  . feeding supplement  1 Container Oral TID BM  . feeding supplement (NEPRO CARB STEADY)  237 mL Oral BID BM  . heparin  5,000 Units Subcutaneous Q8H  . lamiVUDine  25 mg Oral Daily  . midodrine  10 mg Oral TID WC  . multivitamin  1 tablet Oral Daily  . paricalcitol  8 mcg Intravenous Q T,Th,Sa-HD  . ritonavir  100 mg  Oral Q breakfast  . sodium chloride  25 mL/kg Intravenous Once  . sodium chloride  3 mL Intravenous Q12H  . sodium chloride  3 mL Intravenous Q12H  . stavudine  20 mg Oral Daily  . sulfamethoxazole-trimethoprim  2 tablet Oral QHS  . DISCONTD: sulfamethoxazole-trimethoprim  320 mg Intravenous Q24H  . DISCONTD: vancomycin  500 mg Intravenous Q T,Th,Sa-HD    I  have reviewed scheduled and prn medications.  Myriam Jacobson, PA-C [facility] Beeper [phone]  05/23/2011,12:07 PM  LOS: 5 days   Patient seen and examined and agree with assessment and plan as above.  Kelly Splinter  MD [facility] [phone] pgr    [phone] cell 05/23/2011, 2:55 PM

## 2011-05-23 NOTE — Evaluation (Signed)
Occupational Therapy Evaluation Patient Details Name: [name] MRN: [id number] DOB: February 24, 1955 Today's Date: 05/23/2011 Time: [id number] OT Time Calculation (min): 23 min  OT Assessment / Plan / Recommendation Clinical Impression  Pt admitted for sepsis and presents with generalized weakness and decreased independence with ADLs. Pt will benefit from skilled OT in the acute setting to maximize I with ADL and ADL mobility prior to d/c to SNF    OT Assessment  Patient needs continued OT Services    Follow Up Recommendations  Skilled nursing facility (or HHOT with 24hr supervision and Min A)    Barriers to Discharge Decreased caregiver support (per CSW note, 24hr not avaible; per pt it is)    Equipment Recommendations  None recommended by OT    Recommendations for Other Services    Frequency  Min 2X/week    Precautions / Restrictions Precautions Precautions: Fall Restrictions Weight Bearing Restrictions: No   Pertinent Vitals/Pain Pt with no c/o pain at this time    ADL  Eating/Feeding: Performed;Set up Where Assessed - Eating/Feeding: Chair Grooming: Performed;Min guard Where Assessed - Grooming: Unsupported standing Upper Body Bathing: Simulated;Set up Where Assessed - Upper Body Bathing: Unsupported sitting Lower Body Bathing: Simulated;Minimal assistance Where Assessed - Lower Body Bathing: Unsupported sitting;Supported sit to stand Upper Body Dressing: Simulated;Minimal assistance Where Assessed - Upper Body Dressing: Unsupported sitting Lower Body Dressing: Performed;Set up (don/doff socks) Where Assessed - Lower Body Dressing: Unsupported sitting Toilet Transfer: Performed;Min guard Toilet Transfer Method: Sit to Loss adjuster, chartered: Therapist, occupational and Hygiene: Performed;Minimal assistance Where Assessed - Best boy and Hygiene: Standing Tub/Shower Transfer: Simulated;Minimal assistance  (simulated in room with trash can) Tub/Shower Transfer Method: Ambulating Equipment Used: Rolling walker;Gait belt Transfers/Ambulation Related to ADLs: Pt supervision with RW ambulation. Slow gait speed. Pt however, requires Min A with sit to stand from lower surfaces (recliner chair) and multiple attempts to build momentum to stand from higher surfaces (3n1) both using armrests    OT Diagnosis: Generalized weakness  OT Problem List: Decreased strength;Decreased activity tolerance;Decreased knowledge of use of DME or AE;Pain;Decreased safety awareness;Impaired balance (sitting and/or standing) OT Treatment Interventions: Self-care/ADL training;DME and/or AE instruction;Therapeutic activities;Patient/family education;Balance training   OT Goals Acute Rehab OT Goals OT Goal Formulation: With patient Time For Goal Achievement: 06/06/11 Potential to Achieve Goals: Good ADL Goals Pt Will Perform Grooming: Independently;Standing at sink ADL Goal: Grooming - Progress: Goal set today Pt Will Perform Lower Body Bathing: Sit to stand in shower;with set-up;with supervision ADL Goal: Lower Body Bathing - Progress: Goal set today Pt Will Perform Lower Body Dressing: Independently;Sit to stand from bed;Sit to stand from chair ADL Goal: Lower Body Dressing - Progress: Goal set today Pt Will Transfer to Toilet: with modified independence;Ambulation;with DME;3-in-1 ADL Goal: Toilet Transfer - Progress: Goal set today Pt Will Perform Toileting - Clothing Manipulation: Independently;Standing ADL Goal: Toileting - Clothing Manipulation - Progress: Goal set today Pt Will Perform Tub/Shower Transfer: Tub transfer;with DME;Ambulation ADL Goal: Tub/Shower Transfer - Progress: Goal set today Additional ADL Goal #1: Pt will be Mod I with all ADL item retrieval. ADL Goal: Additional Goal #1 - Progress: Goal set today  Visit Information  Last OT Received On: 05/23/11 Assistance Needed: +1    Subjective Data   Subjective: Somebody can be home all the time. When one leaves for work, the other comes home. Patient Stated Goal: Return home   Prior [place] With: Daughter Available Help at  Discharge: Family Type of Home: Apartment Home Access: Ramped entrance Home Layout: One level Bathroom Shower/Tub: Walk-in Sales promotion account executive: Standard Bathroom Accessibility: Yes How Accessible: Accessible via walker [place]: Bedside commode/3-in-1;Shower chair with back;Walker - rolling;Wheelchair - manual Prior Function Level of Independence: Independent with assistive device(s) Able to Take Stairs?: No Driving: No Vocation: Retired 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 difficulties Dominant Hand: Right    Cognition  Overall Cognitive Status: Appears within functional limits for tasks assessed/performed Arousal/Alertness: Awake/alert Orientation Level: Appears intact for tasks assessed;Oriented X4 / Intact Behavior During Session: [facility] for tasks performed    Extremity/Trunk Assessment Right Upper Extremity Assessment RUE ROM/Strength/Tone: Deficits RUE ROM/Strength/Tone Deficits: grossly 4/5; triceps especially weak RUE Sensation: WFL - Light Touch RUE Coordination: WFL - gross/fine motor Left Upper Extremity Assessment LUE ROM/Strength/Tone: Deficits LUE ROM/Strength/Tone Deficits: grossly 4/5; triceps especially weak LUE Sensation: WFL - Light Touch LUE Coordination: WFL - gross/fine motor   Mobility Bed Mobility Bed Mobility: Not assessed Transfers Sit to Stand: From chair/3-in-1;With armrests;4: Min assist Stand to Sit: 5: Supervision;To chair/3-in-1;With armrests Details for Transfer Assistance: VC for hand placement (multiple times from chair, but pt with good carryover for stand to sit.)   Exercise    Balance    End of Session OT - End of Session Equipment Utilized During Treatment: Gait belt Activity Tolerance: Patient limited by  fatigue Patient left: in chair;with call bell/phone within reach;with family/visitor present   Jais Demir 05/23/2011, 2:10 PM

## 2011-05-23 NOTE — Progress Notes (Signed)
Utilization review completed.  

## 2011-05-23 NOTE — Progress Notes (Signed)
Nutrition Follow-up  Diet Order:  Renal 60-70 02-07-1198 PO intake variable 50-100%.  RD spoke with Dr. Nicoletta Dress who agreed with liberalizing diet to Regular, RD will change.  Pt states that she still has little appetite, though does like and drink Ensure. Pt had last HD on 5/16.   Meds: Scheduled Meds:   . acidophilus  1 capsule Oral Daily  . antiseptic oral rinse  15 mL Mouth Rinse BID  . aspirin  81 mg Oral Daily  .  ceFAZolin (ANCEF) IV  2 g Intravenous Q T,Th,Sat-1800  . darbepoetin (ARANESP) injection - DIALYSIS  200 mcg Intravenous Q Thu-HD  . darunavir  800 mg Oral Q breakfast  . famotidine  20 mg Oral Daily  . feeding supplement  1 Container Oral TID BM  . feeding supplement (NEPRO CARB STEADY)  237 mL Oral BID BM  . heparin  5,000 Units Subcutaneous Q8H  . lamiVUDine  25 mg Oral Daily  . midodrine  10 mg Oral TID WC  . multivitamin  1 tablet Oral Daily  . paricalcitol  8 mcg Intravenous Q T,Th,Sa-HD  . ritonavir  100 mg Oral Q breakfast  . sodium chloride  25 mL/kg Intravenous Once  . sodium chloride  3 mL Intravenous Q12H  . sodium chloride  3 mL Intravenous Q12H  . stavudine  20 mg Oral Daily  . sulfamethoxazole-trimethoprim  2 tablet Oral QHS  . DISCONTD: sulfamethoxazole-trimethoprim  320 mg Intravenous Q24H  . DISCONTD: vancomycin  500 mg Intravenous Q T,Th,Sa-HD   Continuous Infusions:  PRN Meds:.sodium chloride, sodium chloride, sodium chloride, acetaminophen, albumin human, bismuth subsalicylate, bismuth subsalicylate, diphenhydrAMINE, feeding supplement (NEPRO CARB STEADY), guaiFENesin-dextromethorphan, heparin, heparin, heparin, lidocaine, lidocaine-prilocaine, loperamide, ondansetron (ZOFRAN) IV, ondansetron, pentafluoroprop-tetrafluoroeth, sodium chloride  Labs:  CMP     Component Value Date/Time   NA 133* 05/23/2011 0550   K 3.6 05/23/2011 0550   CL 99 05/23/2011 0550   CO2 28 05/23/2011 0550   GLUCOSE 91 05/23/2011 0550   BUN 9 05/23/2011 0550   CREATININE 2.23*  05/23/2011 0550   CREATININE 4.98* 02/19/2011 1048   CALCIUM 8.3* 05/23/2011 0550   CALCIUM 7.6* 09/14/2007 1321   PROT 7.8 05/23/2011 0550   ALBUMIN 1.5* 05/23/2011 0550   AST 40* 05/23/2011 0550   ALT 10 05/23/2011 0550   ALKPHOS 276* 05/23/2011 0550   BILITOT 0.3 05/23/2011 0550   GFRNONAA 24* 05/23/2011 0550   GFRAA 28* 05/23/2011 0550   Magnesium  Date/Time Value Range Status  09/11/2010  4:30 AM 1.7  1.5-2.5 (mg/dL) Final   Phosphorus  Date/Time Value Range Status  05/20/2011 10:22 AM 1.5* 2.3-4.6 (mg/dL) Final     Intake/Output Summary (Last 24 hours) at 05/23/11 1130 Last data filed at 05/23/11 1001  Gross per 24 hour  Intake    843 ml  Output    297 ml  Net    546 ml    Weight Status:  123 lbs, variable with HD. Dry weight seems to be about 117-118 lbs per last few sessions  Re-estimated needs:  1700-1900 kcal, 79-92 gm protein  Nutrition Dx:  Malnutrition, ongoing  Goal:  PO intake to meet 90-100% of estimated nutrition needs, unmet  Intervention:   1. RD will change to Regular diet 2. Continue with current supplements as ordered 3. RD will continue to follow  Monitor:  PO intake, weight, labs, I/O's   Coralee Rud Pager #:  [phone]

## 2011-05-23 NOTE — Progress Notes (Signed)
Clinical Social Work Department BRIEF PSYCHOSOCIAL ASSESSMENT 05/23/2011  Patient:  [name], [name]     Account Number:  [id number]     Admit date:  05/18/2011  Clinical Social Worker:  Glenice Laine  Date/Time:  05/23/2011 09:25 AM  Referred by:  Physician  Date Referred:  05/22/2011 Referred for  SNF Placement   Other Referral:   Interview type:  Patient Other interview type:    PSYCHOSOCIAL DATA Living Status:  FAMILY Admitted from facility:   Level of care:   Primary support name:  Enid Derry [phone] Primary support relationship to patient:  SIBLING Degree of support available:   Strong    CURRENT CONCERNS Current Concerns  Other - See comment   Other Concerns:   D/C Planning    SOCIAL WORK ASSESSMENT / PLAN CSW met with pt and sister at bedside to discuss D/C planning. Pt alert and oriented and requested sisters input with decision making. Sister is pt primary support and per pt request will serve as primary contact for family. Sister comfortable with this and will relay information to pt's daughters.  Pt and sister are familiar with placement process and are agreeable to SNF-at this time pt is living with daughter who works and is unable to provide 24 hour care. Pt and sister understand that due to complexity of diagnoses/medications/ and medicaid bed availability, there may be limited options.  CSW will initiate FL2 and bed search and provide family with offers.   Assessment/plan status:  Psychosocial Support/Ongoing Assessment of Needs Other assessment/ plan:   Information/referral to community resources:   ST Rehab List    PATIENT'S/FAMILY'S RESPONSE TO PLAN OF CARE: Pt and sister agreeable and pleasant- Pt has been to [facility] for placement in the past and relayed they would not like to go that far again.  CSW will follow and assist with any needs PRN  Gerre Scull, [phone]

## 2011-05-24 LAB — CBC
HCT: 25.5 % — ABNORMAL LOW (ref 36.0–46.0)
Hemoglobin: 8.1 g/dL — ABNORMAL LOW (ref 12.0–15.0)
MCH: 30 pg (ref 26.0–34.0)
MCHC: 31.8 g/dL (ref 30.0–36.0)
MCV: 94.4 fL (ref 78.0–100.0)
Platelets: 140 10*3/uL — ABNORMAL LOW (ref 150–400)
RBC: 2.7 MIL/uL — ABNORMAL LOW (ref 3.87–5.11)
RDW: 20.9 % — ABNORMAL HIGH (ref 11.5–15.5)
WBC: 3.8 10*3/uL — ABNORMAL LOW (ref 4.0–10.5)

## 2011-05-24 LAB — CULTURE, BLOOD (ROUTINE X 2)
Culture  Setup Time: 201305121732
Culture: NO GROWTH

## 2011-05-24 LAB — RENAL FUNCTION PANEL
Albumin: 1.3 g/dL — ABNORMAL LOW (ref 3.5–5.2)
BUN: 18 mg/dL (ref 6–23)
CO2: 27 meq/L (ref 19–32)
Calcium: 8.7 mg/dL (ref 8.4–10.5)
Chloride: 100 meq/L (ref 96–112)
Creatinine, Ser: 3.3 mg/dL — ABNORMAL HIGH (ref 0.50–1.10)
GFR calc Af Amer: 17 mL/min — ABNORMAL LOW
GFR calc non Af Amer: 15 mL/min — ABNORMAL LOW
Glucose, Bld: 77 mg/dL (ref 70–99)
Phosphorus: 2.2 mg/dL — ABNORMAL LOW (ref 2.3–4.6)
Potassium: 3.9 meq/L (ref 3.5–5.1)
Sodium: 133 meq/L — ABNORMAL LOW (ref 135–145)

## 2011-05-24 NOTE — Progress Notes (Signed)
[facility] Progress Note  Subjective: Doing well, no complaints.   Objective Filed Vitals:   05/23/11 1300 05/23/11 1639 05/23/11 2026 05/24/11 0424  BP: 98/54 92/50 132/70 101/61  Pulse: 66 63 57 66  Temp: 98.3 F (36.8 C)  97.9 F (36.6 C) 98 F (36.7 C)  TempSrc:   Oral Oral  Resp: 18 18 20 20   Height:      Weight:    56 kg (123 lb 7.3 oz)  SpO2: 100% 95% 96% 97%   Physical Exam General: comfortable; no teeth (she has lost weight Since I last saw her) Heart: RRR 2/6 murmur Lungs: no rales or rhochi appreciated Abdomen: soft NT Extremities: muscle wasting; tr LE edema Dialysis Access: none at present; left neck dressing in place  [facility], 4 hrs, 56 kg EDW. EPO 10,4000. Zemplar 8ug. No IV Fe (d/c'd in April). L IJ  Assessment/Plan: 1. Sepsis/1/4 +MSSA  Plus CNSS bacteremia/MV possible vegetation- course c/w sepsis and possible endocarditis, w tunnelled HD cath is likely source of infection. Also has PNA. Catheter removed 5/16.  IV antibiotics changed to Ancef.  2. ESRD / TTS at [place]- no dialysis this weekend, HD catheter removed 5/16 due to infection - plan next HD Monday after new catheter placement; follow chemistries. K+ stable, less than 4 today. 3. Chronic hypotension/Volume -  on Midodrine due to chronic hypotension issues.  4. Anemia - Hgb 8.8 5/14 on outpatient Epogen 10,400 U per HD and weekly Venofer. Aranesp 200 mcg here, and weekly IV Fe; recheck in am. 5. Metabolic bone disease - Ca 8.3 (10.6 corrected), P 1.5 5/14 on Zemplar 8 mcg per HD. Will continue. HD records show phoslo 1ac and tums 3ac with meals. Binders on hold for now; changed am lab to renal to check P level 6. Nutrition - Alb 1.5, on Megace as outpatient.- diet liberalized to regular - agree - needs foods easy to chew 7. HIV/AIDS - on antiviral meds + bactrim followed by ID. Viral load extremely high (1.5 million)  Additional Objective Labs: Basic Metabolic  Panel:  Lab [id number] 05/23/11 0550 05/22/11 0900 05/20/11 1022 05/19/11 0700  NA 133* 133* 130* -- --  K 3.9 3.6 4.4 -- --  CL 100 99 99 -- --  CO2 27 28 24  -- --  GLUCOSE 77 91 100* -- --  BUN 18 9 19  -- --  CREATININE 3.30* 2.23* 3.55* -- --  CALCIUM 8.7 8.3* 8.2* -- --  ALB -- -- -- -- --  PHOS 2.2* -- -- 1.5* 3.5   Liver Function Tests:  Lab 05/24/11 0530 05/23/11 0550 05/22/11 0900 05/21/11 0515  AST -- 40* 42* 39*  ALT -- 10 13 11   ALKPHOS -- 276* 309* 329*  BILITOT -- 0.3 0.3 0.5  PROT -- 7.8 8.0 7.5  ALBUMIN 1.3* 1.5* 1.2* --    Lab 05/18/11 1259  LIPASE 12  AMYLASE --   CBC:  Lab 05/24/11 0530 05/20/11 1022 05/19/11 0500 05/18/11 1708 05/18/11 1259  WBC 3.8* 3.2* 3.3* -- --  NEUTROABS -- -- -- -- 2.5  HGB 8.1* 8.8* 8.3* -- --  HCT 25.5* 27.6* 25.5* -- --  MCV 94.4 92.3 91.4 91.5 91.7  PLT 140* 103* 90* -- --   Blood Culture    Component Value Date/Time   SDES BLOOD RIGHT HAND 05/23/2011 1450   SPECREQUEST BOTTLES DRAWN AEROBIC AND ANAEROBIC 10CC 05/23/2011 1450   CULT  BLOOD CULTURE RECEIVED NO GROWTH TO DATE CULTURE WILL BE HELD FOR 5 DAYS BEFORE ISSUING A FINAL NEGATIVE REPORT 05/23/2011 1450   REPTSTATUS PENDING 05/23/2011 1450    Cardiac Enzymes:  Lab 05/18/11 1702  CKTOTAL 114  CKMB 3.7  CKMBINDEX --  TROPONINI <0.30   [name] Splinter  MD [facility] [phone] pgr    [phone] cell 05/24/2011, 9:46 AM

## 2011-05-24 NOTE — Progress Notes (Signed)
Subjective: Patient feels weak and wants to eat but cannot eat much. Was to try eat icing today. Has no nausea vomiting. She remains afebrile.  Objective: Vital signs in last 24 hours: Filed Vitals:   05/23/11 1300 05/23/11 1639 05/23/11 2026 05/24/11 0424  BP: 98/54 92/50 132/70 101/61  Pulse: 66 63 57 66  Temp: 98.3 F (36.8 C)  97.9 F (36.6 C) 98 F (36.7 C)  TempSrc:   Oral Oral  Resp: 18 18 20 20   Height:      Weight:    123 lb 7.3 oz (56 kg)  SpO2: 100% 95% 96% 97%   Weight change: -7.1 oz (-0.2 kg)  Intake/Output Summary (Last 24 hours) at 05/24/11 1015 Last data filed at 05/24/11 0856  Gross per 24 hour  Intake    480 ml  Output      1 ml  Net    479 ml   General: NAD. Cathectic and chronic ill appearing  Skin: No petechiae is noted on MM. No splinter hemorrhage, Janeway lesions or Osler nodes noted  Scattered skin abrasions on her back. Right FA a healing ulcer noted  Lungs: CTA B/L, left chest HD catheter.  Heart: RRR, S1, S2, no murmur Abd: Soft, tenderness to palpation suprapubic area. BS x 4  MSK: Mild tenderness to palpation on bilateral plantar side of the feet. Ext: PPP+1, no edema.   Lab Results: Basic Metabolic Panel:  Lab [id number] 0530 05/23/11 0550 05/20/11 1022  NA 133* 133* --  K 3.9 3.6 --  CL 100 99 --  CO2 27 28 --  GLUCOSE 77 91 --  BUN 18 9 --  CREATININE 3.30* 2.23* --  CALCIUM 8.7 8.3* --  MG -- -- --  PHOS 2.2* -- 1.5*   Liver Function Tests:  Lab 05/24/11 0530 05/23/11 0550 05/22/11 0900  AST -- 40* 42*  ALT -- 10 13  ALKPHOS -- 276* 309*  BILITOT -- 0.3 0.3  PROT -- 7.8 8.0  ALBUMIN 1.3* 1.5* --    Lab 05/18/11 1259  LIPASE 12  AMYLASE --   CBC:  Lab 05/24/11 0530 05/20/11 1022 05/18/11 1259  WBC 3.8* 3.2* --  NEUTROABS -- -- 2.5  HGB 8.1* 8.8* --  HCT 25.5* 27.6* --  MCV 94.4 92.3 --  PLT 140* 103* --   Cardiac Enzymes:  Lab 05/18/11 1702  CKTOTAL 114  CKMB 3.7  CKMBINDEX --  TROPONINI <0.30    CBG:  Lab 05/18/11 1745  GLUCAP 103*   Coagulation:  Lab 05/18/11 1708  LABPROT 19.3*  INR 1.60*    Micro Results: Recent Results (from the past 240 hour(s))  CULTURE, BLOOD (ROUTINE X 2)     Status: Normal   Collection Time   05/18/11  1:05 PM      Component Value Range Status Comment   Specimen Description BLOOD LEFT ARM   Final    Special Requests BOTTLES DRAWN AEROBIC AND ANAEROBIC 10CC   Final    Culture  Setup Time [id number]   Final    Culture     Final    Value: STAPHYLOCOCCUS AUREUS     Note: RIFAMPIN AND GENTAMICIN SHOULD NOT BE USED AS SINGLE DRUGS FOR TREATMENT OF STAPH INFECTIONS.     STAPHYLOCOCCUS SPECIES (COAGULASE NEGATIVE)     Note: THE SIGNIFICANCE OF ISOLATING THIS ORGANISM FROM A SINGLE SET OF BLOOD CULTURES WHEN MULTIPLE SETS ARE DRAWN IS UNCERTAIN. PLEASE NOTIFY THE MICROBIOLOGY DEPARTMENT WITHIN ONE WEEK IF  SPECIATION AND SENSITIVITIES ARE REQUIRED.     Note: Gram Stain Report Called to,Read Back By and Verified With: KELSEY WHITE @1916  ON 05/19/11 BY MCLET   Report Status 05/22/2011 FINAL   Final    Organism ID, Bacteria STAPHYLOCOCCUS AUREUS   Final   CULTURE, BLOOD (ROUTINE X 2)     Status: Normal   Collection Time   05/18/11  1:11 PM      Component Value Range Status Comment   Specimen Description BLOOD RIGHT ARM   Final    Special Requests BOTTLES DRAWN AEROBIC ONLY [facility]   Final    Culture  Setup Time [id number]   Final    Culture NO GROWTH 5 DAYS   Final    Report Status 05/24/2011 FINAL   Final   URINE CULTURE     Status: Normal   Collection Time   05/18/11  3:42 PM      Component Value Range Status Comment   Specimen Description URINE, CLEAN CATCH   Final    Special Requests NONE   Final    Culture  Setup Time [id number]   Final    Colony Count NO GROWTH   Final    Culture NO GROWTH   Final    Report Status 05/20/2011 FINAL   Final   CULTURE, BLOOD (ROUTINE X 2)     Status: Normal (Preliminary result)   Collection Time   05/18/11   5:08 PM      Component Value Range Status Comment   Specimen Description BLOOD LEFT ARM   Final    Special Requests BOTTLES DRAWN AEROBIC AND ANAEROBIC 10CC   Final    Culture  Setup Time [id number]   Final    Culture     Final    Value:        BLOOD CULTURE RECEIVED NO GROWTH TO DATE CULTURE WILL BE HELD FOR 5 DAYS BEFORE ISSUING A FINAL NEGATIVE REPORT   Report Status PENDING   Incomplete   CULTURE, BLOOD (ROUTINE X 2)     Status: Normal (Preliminary result)   Collection Time   05/18/11  5:23 PM      Component Value Range Status Comment   Specimen Description BLOOD RIGHT HAND   Final    Special Requests BOTTLES DRAWN AEROBIC AND ANAEROBIC 10CC   Final    Culture  Setup Time [id number]   Final    Culture     Final    Value:        BLOOD CULTURE RECEIVED NO GROWTH TO DATE CULTURE WILL BE HELD FOR 5 DAYS BEFORE ISSUING A FINAL NEGATIVE REPORT   Report Status PENDING   Incomplete   MRSA PCR SCREENING     Status: Normal   Collection Time   05/18/11  5:48 PM      Component Value Range Status Comment   MRSA by PCR NEGATIVE  NEGATIVE  Final   CLOSTRIDIUM DIFFICILE BY PCR     Status: Normal   Collection Time   05/20/11  5:29 AM      Component Value Range Status Comment   C difficile by pcr NEGATIVE  NEGATIVE  Final   CATH TIP CULTURE     Status: Normal (Preliminary result)   Collection Time   05/22/11  6:28 PM      Component Value Range Status Comment   Specimen Description CATH TIP CHEST LEFT   Final    Special Requests NONE   Final  Culture NO GROWTH 1 DAY   Final    Report Status PENDING   Incomplete   CULTURE, EXPECTORATED SPUTUM-ASSESSMENT     Status: Normal   Collection Time   05/23/11  8:10 AM      Component Value Range Status Comment   Specimen Description SPUTUM   Final    Special Requests EXPECT. SPU   Final    Sputum evaluation     Final    Value: THIS SPECIMEN IS ACCEPTABLE. RESPIRATORY CULTURE REPORT TO FOLLOW.   Report Status 05/23/2011 FINAL   Final   CULTURE,  RESPIRATORY     Status: Normal (Preliminary result)   Collection Time   05/23/11  8:10 AM      Component Value Range Status Comment   Specimen Description SPUTUM   Final    Special Requests NONE   Final    Gram Stain     Final    Value: ABUNDANT WBC PRESENT,BOTH PMN AND MONONUCLEAR     FEW SQUAMOUS EPITHELIAL CELLS PRESENT     RARE GRAM POSITIVE COCCI IN PAIRS   Culture PENDING   Incomplete    Report Status PENDING   Incomplete   CULTURE, BLOOD (ROUTINE X 2)     Status: Normal (Preliminary result)   Collection Time   05/23/11  2:35 PM      Component Value Range Status Comment   Specimen Description BLOOD ARM RIGHT   Final    Special Requests BOTTLES DRAWN AEROBIC AND ANAEROBIC 10CC   Final    Culture  Setup Time [id number]   Final    Culture     Final    Value:        BLOOD CULTURE RECEIVED NO GROWTH TO DATE CULTURE WILL BE HELD FOR 5 DAYS BEFORE ISSUING A FINAL NEGATIVE REPORT   Report Status PENDING   Incomplete   CULTURE, BLOOD (ROUTINE X 2)     Status: Normal (Preliminary result)   Collection Time   05/23/11  2:50 PM      Component Value Range Status Comment   Specimen Description BLOOD RIGHT HAND   Final    Special Requests BOTTLES DRAWN AEROBIC AND ANAEROBIC 10CC   Final    Culture  Setup Time [id number]   Final    Culture     Final    Value:        BLOOD CULTURE RECEIVED NO GROWTH TO DATE CULTURE WILL BE HELD FOR 5 DAYS BEFORE ISSUING A FINAL NEGATIVE REPORT   Report Status PENDING   Incomplete   CLOSTRIDIUM DIFFICILE BY PCR     Status: Normal   Collection Time   05/23/11  4:16 PM      Component Value Range Status Comment   C difficile by pcr NEGATIVE  NEGATIVE  Final    Studies/Results: No results found. Medications: I have reviewed the patient's current medications. Scheduled Meds:    . acidophilus  1 capsule Oral Daily  . antiseptic oral rinse  15 mL Mouth Rinse BID  . aspirin  81 mg Oral Daily  .  ceFAZolin (ANCEF) IV  2 g Intravenous Q T,Th,Sat-1800  .  darbepoetin (ARANESP) injection - DIALYSIS  200 mcg Intravenous Q Thu-HD  . darunavir  800 mg Oral Q breakfast  . famotidine  20 mg Oral Daily  . feeding supplement  1 Container Oral TID BM  . feeding supplement (NEPRO CARB STEADY)  237 mL Oral BID BM  . heparin  5,000 Units  Subcutaneous Q8H  . lamiVUDine  25 mg Oral Daily  . midodrine  10 mg Oral TID WC  . multivitamin  1 tablet Oral Daily  . paricalcitol  8 mcg Intravenous Q T,Th,Sa-HD  . ritonavir  100 mg Oral Q breakfast  . sodium chloride  25 mL/kg Intravenous Once  . sodium chloride  3 mL Intravenous Q12H  . sodium chloride  3 mL Intravenous Q12H  . stavudine  20 mg Oral Daily  . sulfamethoxazole-trimethoprim  2 tablet Oral QHS   Continuous Infusions:  PRN Meds:.sodium chloride, acetaminophen, albumin human, diphenhydrAMINE, feeding supplement (NEPRO CARB STEADY), guaiFENesin-dextromethorphan, lidocaine, lidocaine-prilocaine, loperamide, ondansetron (ZOFRAN) IV, ondansetron, pentafluoroprop-tetrafluoroeth, sodium chloride, DISCONTD: sodium chloride, DISCONTD: sodium chloride, DISCONTD: bismuth subsalicylate, DISCONTD: bismuth subsalicylate, DISCONTD: heparin, DISCONTD: heparin, DISCONTD: heparin Assessment/Plan:  Disposition Overall poor prognosis due to poorly controlled HIV and severe infections including endocarditis. SNF at discharge.                       She will be likely ready for discharge early next week- was she gets her HD catheter placement.  She will be discharged with IV Ancef and po Bactrim.  #. Severe Sepsis , resolving  Patient feels better and her symptoms are slightly improved. She is hemodynamically stable.  The etiologies of severe sepsis are likely HCAP vs PCP Vs HD catheter infection vs endocarditis.   - Blood culture>>>1/2 Staph aureus>>> vancomycin is switched to Ancef with HD since Ancef has better bactericidal effect after discussion with the pharmacist. - for HCAP >>> Ancef                - for  possible PCP>>>less likely to obtain sputum sample since she only has nonproductive cough.                               >>> Bactrim po at 5mg /kg/hour daily for PCP Tx. She will need the treatment for 21 days                               >>> ABG shows PO2 82 ans Aa gradient of 8>>>no need for steroids for now.   #Bacterremia and bacterial endocarditis  Staph aureus in 1/2 blood culture and two majors and two minors>>>confirmed  The source of her bacteremia and bacterial endocarditis is likely her left chest HD catheter, especially her catheter was very dirty and dry food particles were noted on the catheter on admission.   -HD catheter removed on 05/22/11.  New HD catheter on Monday 05/26/11 - Repeat blood culture x2 since the catheter is removed- negative to date. - Ancef IV which HD - will need 6 weeks tx, especially given her poorly controlled HIV.   # HCAP vs PCP PNA , improving ESRD with HD patient is found to have B/L opacity with R>L on chest ray>>>HCAP  Poorly controlled HIV due to medical noncompliance>>>ca not rule out PCP   - crypto antigen>>>Negative  - Ancef, bactrim see above note   - symptomatic treatment for cough.   # Uremia syndrome , resolved  # ESRD with HD,per renal  # shock liver, improving   - continue to monitor.   # Diarrhea:   - Negative C-diff PCR  - Probiotics  - imodium PRN   # Suprapubic pain , resolved  likely related to her uremia syndrome  -  Urine culture Negative   # Congestive cardiomyopathy  LV EF 40-45% Per TTE on 05/21/2011- possible small mitral valve vegetation. Patient is not volume overload on admission. Caution IVF replacement for sepsis and septic shock  -Continue to monitor   # HIV infection  This is a chronic problem. She does have a poor compliance with her medications. Her last CD4 220 and VL [id number] on 02/19/11.  - will continue her home medications  - CD4 130 and VL [id number]   #Thrombocytopenia  Chronic stable. Likely due  to end-stage renal disease.   # Anemia of chronic disease, baseline 8-9  Stable  - On Procrit -Weekly Venofer  # Mild Hypertension  Baseline SBP ranges at 140's. Not on any medical treatment .  # Malnutrition, likely due to renal disease  Albumin 1.5 on this admission  - Oral supplement  -Nutritionist consult  - albumin Tx given per renal  -Will not continue Megace since it increase risk of thrombosis especially patient is on Procrit.  - she wanted to eat Icing today- talked with RN to help with her diet needs.  # VTE: Heparin      LOS: 6 days   Darby Shadwick 05/24/2011, 10:15 AM

## 2011-05-25 LAB — CULTURE, RESPIRATORY

## 2011-05-25 LAB — CBC
HCT: 25.8 % — ABNORMAL LOW (ref 36.0–46.0)
Hemoglobin: 8.3 g/dL — ABNORMAL LOW (ref 12.0–15.0)
MCH: 30 pg (ref 26.0–34.0)
MCHC: 32.2 g/dL (ref 30.0–36.0)
MCV: 93.1 fL (ref 78.0–100.0)
Platelets: 192 10*3/uL (ref 150–400)
RBC: 2.77 MIL/uL — ABNORMAL LOW (ref 3.87–5.11)
RDW: 20.7 % — ABNORMAL HIGH (ref 11.5–15.5)
WBC: 4.7 10*3/uL (ref 4.0–10.5)

## 2011-05-25 LAB — COMPREHENSIVE METABOLIC PANEL
ALT: <5 U/L (ref 0–35)
AST: 39 U/L — ABNORMAL HIGH (ref 0–37)
Albumin: 1.3 g/dL — ABNORMAL LOW (ref 3.5–5.2)
Alkaline Phosphatase: 221 U/L — ABNORMAL HIGH (ref 39–117)
BUN: 27 mg/dL — ABNORMAL HIGH (ref 6–23)
CO2: 27 mEq/L (ref 19–32)
Calcium: 8.6 mg/dL (ref 8.4–10.5)
Chloride: 97 mEq/L (ref 96–112)
Creatinine, Ser: 4.82 mg/dL — ABNORMAL HIGH (ref 0.50–1.10)
GFR calc Af Amer: 11 mL/min — ABNORMAL LOW (ref >90)
GFR calc non Af Amer: 9 mL/min — ABNORMAL LOW (ref >90)
Glucose, Bld: 68 mg/dL — ABNORMAL LOW (ref 70–99)
Potassium: 4.5 mEq/L (ref 3.5–5.1)
Sodium: 130 mEq/L — ABNORMAL LOW (ref 135–145)
Total Bilirubin: 0.2 mg/dL — ABNORMAL LOW (ref 0.3–1.2)
Total Protein: 8 g/dL (ref 6.0–8.3)

## 2011-05-25 LAB — CULTURE, RESPIRATORY W GRAM STAIN

## 2011-05-25 LAB — CULTURE, BLOOD (ROUTINE X 2)
Culture  Setup Time: 201305130222
Culture  Setup Time: 201305130222
Culture: NO GROWTH
Culture: NO GROWTH

## 2011-05-25 LAB — CATH TIP CULTURE: Culture: NO GROWTH

## 2011-05-25 MED ORDER — HEPARIN SODIUM (PORCINE) 1000 UNIT/ML DIALYSIS
2000.0000 [IU] | INTRAMUSCULAR | Status: DC | PRN
  Filled 2011-05-25 (×2): qty 2

## 2011-05-25 MED ORDER — MIDAZOLAM HCL 2 MG/2ML IJ SOLN: 1.0000 mg | INTRAMUSCULAR | Status: DC | PRN

## 2011-05-25 MED ORDER — CEFAZOLIN SODIUM 1-5 GM-% IV SOLN
1.0000 g | INTRAVENOUS | Status: AC
  Filled 2011-05-25: qty 50

## 2011-05-25 MED ORDER — FENTANYL CITRATE 0.05 MG/ML IJ SOLN: 50.0000 ug | INTRAMUSCULAR | Status: DC | PRN

## 2011-05-25 MED ORDER — LACTATED RINGERS IV SOLN: INTRAVENOUS | Status: DC

## 2011-05-25 NOTE — Progress Notes (Signed)
Subjective: Patient feels ok. Has no new complaints overnight. No nausea vomiting, abdominal pain or chest pain. She'll get HD catheter placed tomorrow. Patient remains afebrile and vitals stable.  Objective: Vital signs in last 24 hours: Filed Vitals:   05/24/11 0424 05/24/11 1330 05/24/11 2100 05/25/11 0541  BP: 101/61 141/85 140/78 106/65  Pulse: 66 56 60 60  Temp: 98 F (36.7 C) 98 F (36.7 C) 98.1 F (36.7 C) 97.9 F (36.6 C)  TempSrc: Oral Oral Oral Oral  Resp: 20 20 20 20   Height:      Weight: 123 lb 7.3 oz (56 kg)   123 lb 10.9 oz (56.1 kg)  SpO2: 97% 100% 98% 96%   Weight change: 3.5 oz (0.1 kg)  Intake/Output Summary (Last 24 hours) at 05/25/11 1046 Last data filed at 05/24/11 2000  Gross per 24 hour  Intake    400 ml  Output      2 ml  Net    398 ml   General: NAD. Cathectic and chronic ill appearing  Skin: No petechiae is noted on MM. No splinter hemorrhage, Janeway lesions or Osler nodes noted  Scattered skin abrasions on her back. Right FA a healing ulcer noted  Lungs: CTA B/L, left chest HD catheter.  Heart: RRR, S1, S2, no murmur Abd: Soft, tenderness to palpation suprapubic area. BS x 4  MSK: Mild tenderness to palpation on bilateral plantar side of the feet. Ext: PPP+1, no edema.   Lab Results: Basic Metabolic Panel:  Lab [id number] 0530 05/23/11 0550 05/20/11 1022  NA 133* 133* --  K 3.9 3.6 --  CL 100 99 --  CO2 27 28 --  GLUCOSE 77 91 --  BUN 18 9 --  CREATININE 3.30* 2.23* --  CALCIUM 8.7 8.3* --  MG -- -- --  PHOS 2.2* -- 1.5*   Liver Function Tests:  Lab 05/24/11 0530 05/23/11 0550 05/22/11 0900  AST -- 40* 42*  ALT -- 10 13  ALKPHOS -- 276* 309*  BILITOT -- 0.3 0.3  PROT -- 7.8 8.0  ALBUMIN 1.3* 1.5* --    Lab 05/18/11 1259  LIPASE 12  AMYLASE --   CBC:  Lab 05/24/11 0530 05/20/11 1022 05/18/11 1259  WBC 3.8* 3.2* --  NEUTROABS -- -- 2.5  HGB 8.1* 8.8* --  HCT 25.5* 27.6* --  MCV 94.4 92.3 --  PLT 140* 103* --    Cardiac Enzymes:  Lab 05/18/11 1702  CKTOTAL 114  CKMB 3.7  CKMBINDEX --  TROPONINI <0.30   CBG:  Lab 05/18/11 1745  GLUCAP 103*   Coagulation:  Lab 05/18/11 1708  LABPROT 19.3*  INR 1.60*    Micro Results: Recent Results (from the past 240 hour(s))  CULTURE, BLOOD (ROUTINE X 2)     Status: Normal   Collection Time   05/18/11  1:05 PM      Component Value Range Status Comment   Specimen Description BLOOD LEFT ARM   Final    Special Requests BOTTLES DRAWN AEROBIC AND ANAEROBIC 10CC   Final    Culture  Setup Time [id number]   Final    Culture     Final    Value: STAPHYLOCOCCUS AUREUS     Note: RIFAMPIN AND GENTAMICIN SHOULD NOT BE USED AS SINGLE DRUGS FOR TREATMENT OF STAPH INFECTIONS.     STAPHYLOCOCCUS SPECIES (COAGULASE NEGATIVE)     Note: THE SIGNIFICANCE OF ISOLATING THIS ORGANISM FROM A SINGLE SET OF BLOOD CULTURES WHEN MULTIPLE SETS ARE  DRAWN IS UNCERTAIN. PLEASE NOTIFY THE MICROBIOLOGY DEPARTMENT WITHIN ONE WEEK IF SPECIATION AND SENSITIVITIES ARE REQUIRED.     Note: Gram Stain Report Called to,Read Back By and Verified With: KELSEY WHITE @1916  ON 05/19/11 BY MCLET   Report Status 05/22/2011 FINAL   Final    Organism ID, Bacteria STAPHYLOCOCCUS AUREUS   Final   CULTURE, BLOOD (ROUTINE X 2)     Status: Normal   Collection Time   05/18/11  1:11 PM      Component Value Range Status Comment   Specimen Description BLOOD RIGHT ARM   Final    Special Requests BOTTLES DRAWN AEROBIC ONLY [place]   Final    Culture  Setup Time [id number]   Final    Culture NO GROWTH 5 DAYS   Final    Report Status 05/24/2011 FINAL   Final   URINE CULTURE     Status: Normal   Collection Time   05/18/11  3:42 PM      Component Value Range Status Comment   Specimen Description URINE, CLEAN CATCH   Final    Special Requests NONE   Final    Culture  Setup Time [id number]   Final    Colony Count NO GROWTH   Final    Culture NO GROWTH   Final    Report Status 05/20/2011 FINAL   Final    CULTURE, BLOOD (ROUTINE X 2)     Status: Normal   Collection Time   05/18/11  5:08 PM      Component Value Range Status Comment   Specimen Description BLOOD LEFT ARM   Final    Special Requests BOTTLES DRAWN AEROBIC AND ANAEROBIC 10CC   Final    Culture  Setup Time [id number]   Final    Culture NO GROWTH 5 DAYS   Final    Report Status 05/25/2011 FINAL   Final   CULTURE, BLOOD (ROUTINE X 2)     Status: Normal   Collection Time   05/18/11  5:23 PM      Component Value Range Status Comment   Specimen Description BLOOD RIGHT HAND   Final    Special Requests BOTTLES DRAWN AEROBIC AND ANAEROBIC 10CC   Final    Culture  Setup Time [id number]   Final    Culture NO GROWTH 5 DAYS   Final    Report Status 05/25/2011 FINAL   Final   MRSA PCR SCREENING     Status: Normal   Collection Time   05/18/11  5:48 PM      Component Value Range Status Comment   MRSA by PCR NEGATIVE  NEGATIVE  Final   CLOSTRIDIUM DIFFICILE BY PCR     Status: Normal   Collection Time   05/20/11  5:29 AM      Component Value Range Status Comment   C difficile by pcr NEGATIVE  NEGATIVE  Final   CATH TIP CULTURE     Status: Normal   Collection Time   05/22/11  6:28 PM      Component Value Range Status Comment   Specimen Description CATH TIP CHEST LEFT   Final    Special Requests NONE   Final    Culture NO GROWTH 2 DAYS   Final    Report Status 05/25/2011 FINAL   Final   CULTURE, EXPECTORATED SPUTUM-ASSESSMENT     Status: Normal   Collection Time   05/23/11  8:10 AM      Component Value Range  Status Comment   Specimen Description SPUTUM   Final    Special Requests EXPECT. SPU   Final    Sputum evaluation     Final    Value: THIS SPECIMEN IS ACCEPTABLE. RESPIRATORY CULTURE REPORT TO FOLLOW.   Report Status 05/23/2011 FINAL   Final   CULTURE, RESPIRATORY     Status: Normal   Collection Time   05/23/11  8:10 AM      Component Value Range Status Comment   Specimen Description SPUTUM   Final    Special Requests NONE    Final    Gram Stain     Final    Value: ABUNDANT WBC PRESENT,BOTH PMN AND MONONUCLEAR     FEW SQUAMOUS EPITHELIAL CELLS PRESENT     RARE GRAM POSITIVE COCCI IN PAIRS   Culture FEW CANDIDA ALBICANS   Final    Report Status 05/25/2011 FINAL   Final   CULTURE, BLOOD (ROUTINE X 2)     Status: Normal (Preliminary result)   Collection Time   05/23/11  2:35 PM      Component Value Range Status Comment   Specimen Description BLOOD ARM RIGHT   Final    Special Requests BOTTLES DRAWN AEROBIC AND ANAEROBIC 10CC   Final    Culture  Setup Time [id number]   Final    Culture     Final    Value:        BLOOD CULTURE RECEIVED NO GROWTH TO DATE CULTURE WILL BE HELD FOR 5 DAYS BEFORE ISSUING A FINAL NEGATIVE REPORT   Report Status PENDING   Incomplete   CULTURE, BLOOD (ROUTINE X 2)     Status: Normal (Preliminary result)   Collection Time   05/23/11  2:50 PM      Component Value Range Status Comment   Specimen Description BLOOD RIGHT HAND   Final    Special Requests BOTTLES DRAWN AEROBIC AND ANAEROBIC 10CC   Final    Culture  Setup Time [id number]   Final    Culture     Final    Value:        BLOOD CULTURE RECEIVED NO GROWTH TO DATE CULTURE WILL BE HELD FOR 5 DAYS BEFORE ISSUING A FINAL NEGATIVE REPORT   Report Status PENDING   Incomplete   CLOSTRIDIUM DIFFICILE BY PCR     Status: Normal   Collection Time   05/23/11  4:16 PM      Component Value Range Status Comment   C difficile by pcr NEGATIVE  NEGATIVE  Final    Studies/Results: No results found. Medications: I have reviewed the patient's current medications. Scheduled Meds:    . acidophilus  1 capsule Oral Daily  . antiseptic oral rinse  15 mL Mouth Rinse BID  . aspirin  81 mg Oral Daily  .  ceFAZolin (ANCEF) IV  2 g Intravenous Q T,Th,Sat-1800  . darbepoetin (ARANESP) injection - DIALYSIS  200 mcg Intravenous Q Thu-HD  . darunavir  800 mg Oral Q breakfast  . famotidine  20 mg Oral Daily  . feeding supplement  1 Container Oral TID BM   . feeding supplement (NEPRO CARB STEADY)  237 mL Oral BID BM  . heparin  5,000 Units Subcutaneous Q8H  . lamiVUDine  25 mg Oral Daily  . midodrine  10 mg Oral TID WC  . multivitamin  1 tablet Oral Daily  . paricalcitol  8 mcg Intravenous Q T,Th,Sa-HD  . ritonavir  100 mg Oral Q breakfast  .  sodium chloride  25 mL/kg Intravenous Once  . sodium chloride  3 mL Intravenous Q12H  . stavudine  20 mg Oral Daily  . sulfamethoxazole-trimethoprim  2 tablet Oral QHS  . DISCONTD: sodium chloride  3 mL Intravenous Q12H   Continuous Infusions:  PRN Meds:.acetaminophen, albumin human, diphenhydrAMINE, feeding supplement (NEPRO CARB STEADY), guaiFENesin-dextromethorphan, lidocaine, lidocaine-prilocaine, loperamide, ondansetron (ZOFRAN) IV, ondansetron, pentafluoroprop-tetrafluoroeth, DISCONTD: sodium chloride, DISCONTD: sodium chloride Assessment/Plan:  Disposition Overall poor prognosis due to poorly controlled HIV and severe infections including endocarditis. SNF at discharge.                       She will be likely ready for discharge early next week- was she gets her HD catheter placement.  She will be discharged with IV Ancef and po Bactrim.  #. Severe Sepsis , resolving  Patient feels better and her symptoms are slightly improved. She is hemodynamically stable.  The etiologies of severe sepsis are likely HCAP vs PCP Vs HD catheter infection vs endocarditis.   - Blood culture>>>1/2 Staph aureus>>> vancomycin is switched to Ancef with HD since Ancef has better bactericidal effect after discussion with the pharmacist. - for HCAP >>> Ancef                - for possible PCP>>>less likely to obtain sputum sample since she only has nonproductive cough.                               >>> Bactrim po at 5mg /kg/hour daily for PCP Tx. She will need the treatment for 21 days                               >>> ABG shows PO2 82 ans Aa gradient of 8>>>no need for steroids for now.   #Bacterremia and bacterial  endocarditis  Staph aureus in 1/2 blood culture and two majors and two minors>>>confirmed  The source of her bacteremia and bacterial endocarditis is likely her left chest HD catheter, especially her catheter was very dirty and dry food particles were noted on the catheter on admission.   -HD catheter removed on 05/22/11.  New HD catheter on Monday 05/26/11 - Repeat blood culture x2 since the catheter is removed- negative to date. - Ancef IV which HD - will need 6 weeks tx, especially given her poorly controlled HIV.   # HCAP vs PCP PNA , improving ESRD with HD patient is found to have B/L opacity with R>L on chest ray>>>HCAP  Poorly controlled HIV due to medical noncompliance>>>ca not rule out PCP   - crypto antigen>>>Negative  - Ancef, bactrim see above note   - symptomatic treatment for cough.   # ESRD with HD,per renal  # Diarrhea:   - Negative C-diff PCR  - Probiotics  - imodium PRN   # Congestive cardiomyopathy  LV EF 40-45% Per TTE on 05/21/2011- possible small mitral valve vegetation. Patient is not volume overload on admission. Caution IVF replacement for sepsis and septic shock  -Continue to monitor   # HIV infection  This is a chronic problem. She does have a poor compliance with her medications. Her last CD4 220 and VL [id number] on 02/19/11.  - will continue her home medications  - CD4 130 and VL [id number]   #Thrombocytopenia  Chronic stable. Likely due to end-stage renal disease.   #  Anemia of chronic disease, baseline 8-9  Stable  - On Procrit -Weekly Venofer  # Mild Hypertension  Baseline SBP ranges at 140's. Not on any medical treatment .  # Malnutrition, likely due to renal disease  Albumin 1.5 on this admission  - Oral supplement  -Nutritionist consult  - albumin Tx given per renal  -Will not continue Megace since it increase risk of thrombosis especially patient is on Procrit.   # VTE: Heparin      LOS: 7 days   Zan Orlick 05/25/2011, 10:46  AM

## 2011-05-25 NOTE — Progress Notes (Signed)
[facility] Progress Note  Subjective: Doing well, no complaints.   Objective Filed Vitals:   05/24/11 0424 05/24/11 1330 05/24/11 2100 05/25/11 0541  BP: 101/61 141/85 140/78 106/65  Pulse: 66 56 60 60  Temp: 98 F (36.7 C) 98 F (36.7 C) 98.1 F (36.7 C) 97.9 F (36.6 C)  TempSrc: Oral Oral Oral Oral  Resp: 20 20 20 20   Height:      Weight: 56 kg (123 lb 7.3 oz)   56.1 kg (123 lb 10.9 oz)  SpO2: 97% 100% 98% 96%   Physical Exam General: comfortable; no teeth (she has lost weight Since I last saw her) Heart: RRR 2/6 murmur Lungs: no rales or rhochi appreciated Abdomen: soft NT Extremities: muscle wasting; tr LE edema Dialysis Access: none at present; left neck dressing in place  [facility] HD: [facility], 4 hrs, 56 kg EDW. EPO 10,4000. Zemplar 8ug. No IV Fe (d/c'd in April). L IJ  Assessment/Plan: 1. Sepsis 1/4 +MSSA and CNSS bacteremia/MV possible vegetation- course c/w sepsis and possible endocarditis, w tunnelled HD cath is likely source of infection. Also has PNA. Catheter removed 5/16.  IV antibiotics changed to Ancef.  2. ESRD / TTS at [place]- no dialysis this weekend, HD catheter removed 5/16 due to infection. For new tunelled HD catheter tomorrow and then HD. 3. Chronic hypotension/Volume -  on Midodrine due to chronic hypotension issues.  4. Anemia - Hgb 8.8 5/14 on outpatient Epogen 10,400 U per HD and weekly Venofer. Aranesp 200 mcg here, and weekly IV Fe; recheck in am. 5. Metabolic bone disease - Ca 8.3 (10.6 corrected), P 1.5 5/14 on Zemplar 8 mcg per HD. Will continue. HD records show phoslo 1ac and tums 3ac with meals. Binders on hold for now. 6. Nutrition - Alb 1.5, on Megace as outpatient.- diet liberalized to regular - agree - needs foods easy to chew 7. HIV/AIDS - on antiviral meds + bactrim followed by ID. Viral load extremely high (1.5 million)  Additional Objective Labs: Basic Metabolic Panel:  Lab [id number] 05/23/11 0550 05/22/11  0900 05/20/11 1022 05/19/11 0700  NA 133* 133* 130* -- --  K 3.9 3.6 4.4 -- --  CL 100 99 99 -- --  CO2 27 28 24  -- --  GLUCOSE 77 91 100* -- --  BUN 18 9 19  -- --  CREATININE 3.30* 2.23* 3.55* -- --  CALCIUM 8.7 8.3* 8.2* -- --  ALB -- -- -- -- --  PHOS 2.2* -- -- 1.5* 3.5   Liver Function Tests:  Lab 05/24/11 0530 05/23/11 0550 05/22/11 0900 05/21/11 0515  AST -- 40* 42* 39*  ALT -- 10 13 11   ALKPHOS -- 276* 309* 329*  BILITOT -- 0.3 0.3 0.5  PROT -- 7.8 8.0 7.5  ALBUMIN 1.3* 1.5* 1.2* --   No results found for this basename: LIPASE:3,AMYLASE:3 in the last 168 hours CBC:  Lab 05/24/11 0530 05/20/11 1022 05/19/11 0500 05/18/11 1708  WBC 3.8* 3.2* 3.3* --  NEUTROABS -- -- -- --  HGB 8.1* 8.8* 8.3* --  HCT 25.5* 27.6* 25.5* --  MCV 94.4 92.3 91.4 91.5  PLT 140* 103* 90* --   Blood Culture    Component Value Date/Time   SDES BLOOD RIGHT HAND 05/23/2011 1450   SPECREQUEST BOTTLES DRAWN AEROBIC AND ANAEROBIC 10CC 05/23/2011 1450   CULT        BLOOD CULTURE RECEIVED NO GROWTH TO DATE CULTURE WILL BE HELD FOR 5 DAYS BEFORE ISSUING  A FINAL NEGATIVE REPORT 05/23/2011 1450   REPTSTATUS PENDING 05/23/2011 1450    Cardiac Enzymes:  Lab 05/18/11 1702  CKTOTAL 114  CKMB 3.7  CKMBINDEX --  TROPONINI <0.30   Kelly Splinter  MD [facility] [phone] pgr    [phone] cell 05/25/2011, 4:02 PM

## 2011-05-25 NOTE — Progress Notes (Signed)
Vascular Surgery  For placement of Diatek catheter tomorrow by Dr. Bridgett Larsson. (Had infected catheter removed from L IJ last week).Still has a suture from catheter on left. That can be removed in OR tomorrow.  Orders written.  Judeth Cornfield. Scot Dock, [place], [place] [phone] 05/25/2011

## 2011-05-26 ENCOUNTER — Encounter (HOSPITAL_COMMUNITY)
Admission: EM | Disposition: A | Discharge status: Home / Self Care | Payer: Self-pay | Source: Home / Self Care | Attending: Internal Medicine

## 2011-05-26 ENCOUNTER — Inpatient Hospital Stay (HOSPITAL_COMMUNITY): Payer: Medicaid Other

## 2011-05-26 ENCOUNTER — Inpatient Hospital Stay (HOSPITAL_COMMUNITY): Payer: Medicaid Other | Admitting: Anesthesiology

## 2011-05-26 ENCOUNTER — Encounter (HOSPITAL_COMMUNITY): Payer: Self-pay | Admitting: Anesthesiology

## 2011-05-26 DIAGNOSIS — N186 End stage renal disease: Secondary | ICD-10-CM

## 2011-05-26 HISTORY — PX: INSERTION OF DIALYSIS CATHETER: SHX1324

## 2011-05-26 LAB — RENAL FUNCTION PANEL
Albumin: 1.2 g/dL — ABNORMAL LOW (ref 3.5–5.2)
BUN: 28 mg/dL — ABNORMAL HIGH (ref 6–23)
CO2: 26 mEq/L (ref 19–32)
Calcium: 8.6 mg/dL (ref 8.4–10.5)
Chloride: 97 mEq/L (ref 96–112)
Creatinine, Ser: 5.07 mg/dL — ABNORMAL HIGH (ref 0.50–1.10)
GFR calc Af Amer: 10 mL/min — ABNORMAL LOW (ref >90)
GFR calc non Af Amer: 9 mL/min — ABNORMAL LOW (ref >90)
Glucose, Bld: 68 mg/dL — ABNORMAL LOW (ref 70–99)
Phosphorus: 3.9 mg/dL (ref 2.3–4.6)
Potassium: 4.6 mEq/L (ref 3.5–5.1)
Sodium: 129 mEq/L — ABNORMAL LOW (ref 135–145)

## 2011-05-26 LAB — CBC
HCT: 27 % — ABNORMAL LOW (ref 36.0–46.0)
Hemoglobin: 8.5 g/dL — ABNORMAL LOW (ref 12.0–15.0)
MCH: 29.7 pg (ref 26.0–34.0)
MCHC: 31.5 g/dL (ref 30.0–36.0)
MCV: 94.4 fL (ref 78.0–100.0)
Platelets: 204 10*3/uL (ref 150–400)
RBC: 2.86 MIL/uL — ABNORMAL LOW (ref 3.87–5.11)
RDW: 20.5 % — ABNORMAL HIGH (ref 11.5–15.5)
WBC: 4 10*3/uL (ref 4.0–10.5)

## 2011-05-26 SURGERY — INSERTION OF DIALYSIS CATHETER
Anesthesia: Monitor Anesthesia Care | Site: Chest | Wound class: Clean

## 2011-05-26 MED ORDER — CEFAZOLIN SODIUM 1-5 GM-% IV SOLN
INTRAVENOUS | Status: DC | PRN
  Administered 2011-05-26: 1 g via INTRAVENOUS

## 2011-05-26 MED ORDER — HEPARIN SODIUM (PORCINE) 1000 UNIT/ML IJ SOLN
INTRAMUSCULAR | Status: DC | PRN
  Administered 2011-05-26: 4.6 mL via INTRAVENOUS

## 2011-05-26 MED ORDER — ENSURE COMPLETE PO LIQD
237.0000 mL | Freq: Two times a day (BID) | ORAL | Status: DC
  Administered 2011-05-27 – 2011-05-30 (×5): 237 mL via ORAL

## 2011-05-26 MED ORDER — SODIUM CHLORIDE 0.9 % IV SOLN
INTRAVENOUS | Status: DC | PRN
  Administered 2011-05-26: 09:00:00 via INTRAVENOUS

## 2011-05-26 MED ORDER — ACETAMINOPHEN 325 MG PO TABS
ORAL_TABLET | ORAL | Status: AC
  Filled 2011-05-26: qty 2

## 2011-05-26 MED ORDER — LIDOCAINE-EPINEPHRINE (PF) 1 %-1:200000 IJ SOLN
INTRAMUSCULAR | Status: DC | PRN
  Administered 2011-05-26: 21 mL

## 2011-05-26 MED ORDER — SODIUM CHLORIDE 0.9 % IR SOLN
Status: DC | PRN
  Administered 2011-05-26: 10:00:00

## 2011-05-26 MED ORDER — CEFAZOLIN SODIUM 1-5 GM-% IV SOLN
INTRAVENOUS | Status: AC
  Filled 2011-05-26: qty 50

## 2011-05-26 MED ORDER — FENTANYL CITRATE 0.05 MG/ML IJ SOLN: 25.0000 ug | INTRAMUSCULAR | Status: DC | PRN

## 2011-05-26 MED ORDER — SODIUM CHLORIDE 0.9 % IR SOLN
Status: DC | PRN
  Administered 2011-05-26: 1000 mL

## 2011-05-26 SURGICAL SUPPLY — 47 items
ADH SKN CLS LQ APL DERMABOND (GAUZE/BANDAGES/DRESSINGS) ×1
BAG DECANTER FOR FLEXI CONT (MISCELLANEOUS) ×2 IMPLANT
CATH CANNON HEMO 15F 50CM (CATHETERS) IMPLANT
CATH CANNON HEMO 15FR 19 (HEMODIALYSIS SUPPLIES) IMPLANT
CATH CANNON HEMO 15FR 23CM (HEMODIALYSIS SUPPLIES) ×1 IMPLANT
CATH CANNON HEMO 15FR 31CM (HEMODIALYSIS SUPPLIES) IMPLANT
CATH CANNON HEMO 15FR 32 (HEMODIALYSIS SUPPLIES) IMPLANT
CATH CANNON HEMO 15FR 32CM (HEMODIALYSIS SUPPLIES) IMPLANT
CLOTH BEACON ORANGE TIMEOUT ST (SAFETY) ×2 IMPLANT
COVER PROBE W GEL 5X96 (DRAPES) ×2 IMPLANT
COVER SURGICAL LIGHT HANDLE (MISCELLANEOUS) ×2 IMPLANT
DERMABOND ADHESIVE PROPEN (GAUZE/BANDAGES/DRESSINGS) ×1
DERMABOND ADVANCED .7 DNX6 (GAUZE/BANDAGES/DRESSINGS) IMPLANT
DRAPE C-ARM 42X72 X-RAY (DRAPES) ×2 IMPLANT
DRAPE CHEST BREAST 15X10 FENES (DRAPES) ×2 IMPLANT
GAUZE SPONGE 2X2 8PLY STRL LF (GAUZE/BANDAGES/DRESSINGS) ×1 IMPLANT
GAUZE SPONGE 4X4 16PLY XRAY LF (GAUZE/BANDAGES/DRESSINGS) ×2 IMPLANT
GLOVE BIO SURGEON STRL SZ7 (GLOVE) ×2 IMPLANT
GLOVE BIOGEL PI IND STRL 7.0 (GLOVE) IMPLANT
GLOVE BIOGEL PI IND STRL 7.5 (GLOVE) ×1 IMPLANT
GLOVE BIOGEL PI INDICATOR 7.0 (GLOVE) ×1
GLOVE BIOGEL PI INDICATOR 7.5 (GLOVE) ×2
GLOVE SURG SS PI 7.5 STRL IVOR (GLOVE) ×1 IMPLANT
GOWN STRL NON-REIN LRG LVL3 (GOWN DISPOSABLE) ×4 IMPLANT
KIT BASIN OR (CUSTOM PROCEDURE TRAY) ×2 IMPLANT
KIT ROOM TURNOVER OR (KITS) ×2 IMPLANT
NDL 18GX1X1/2 (RX/OR ONLY) (NEEDLE) ×1 IMPLANT
NDL HYPO 25GX1X1/2 BEV (NEEDLE) ×1 IMPLANT
NEEDLE 18GX1X1/2 (RX/OR ONLY) (NEEDLE) ×2 IMPLANT
NEEDLE HYPO 25GX1X1/2 BEV (NEEDLE) ×2 IMPLANT
NS IRRIG 1000ML POUR BTL (IV SOLUTION) ×2 IMPLANT
PACK SURGICAL SETUP 50X90 (CUSTOM PROCEDURE TRAY) ×2 IMPLANT
PAD ARMBOARD 7.5X6 YLW CONV (MISCELLANEOUS) ×4 IMPLANT
SOAP 2 % CHG 4 OZ (WOUND CARE) ×2 IMPLANT
SPONGE GAUZE 2X2 STER 10/PKG (GAUZE/BANDAGES/DRESSINGS) ×1
SUT ETHILON 3 0 PS 1 (SUTURE) ×2 IMPLANT
SUT MNCRL AB 4-0 PS2 18 (SUTURE) ×2 IMPLANT
SYR 20CC LL (SYRINGE) ×4 IMPLANT
SYR 30ML LL (SYRINGE) IMPLANT
SYR 3ML LL SCALE MARK (SYRINGE) ×2 IMPLANT
SYR 5ML LL (SYRINGE) ×2 IMPLANT
SYR CONTROL 10ML LL (SYRINGE) ×2 IMPLANT
SYRINGE 10CC LL (SYRINGE) ×2 IMPLANT
TAPE CLOTH SURG 4X10 WHT LF (GAUZE/BANDAGES/DRESSINGS) ×1 IMPLANT
TOWEL OR 17X24 6PK STRL BLUE (TOWEL DISPOSABLE) ×2 IMPLANT
TOWEL OR 17X26 10 PK STRL BLUE (TOWEL DISPOSABLE) ×2 IMPLANT
WATER STERILE IRR 1000ML POUR (IV SOLUTION) ×2 IMPLANT

## 2011-05-26 NOTE — Progress Notes (Signed)
Nutrition Follow-up  Diet Order:  NPO, previously Regular with 1200 ml fluid restriction.  Pt had dialysis catheter placed today.  RD was notified by RN that pt refuses Nepro shakes and Ensure pudding regularly and prefers to have Ensure Complete. RN also reports pt eating very little of meals, breakfast consisted of a piece of toast and coffee.  Meds: Scheduled Meds:   . acidophilus  1 capsule Oral Daily  . antiseptic oral rinse  15 mL Mouth Rinse BID  . aspirin  81 mg Oral Daily  .  ceFAZolin (ANCEF) IV  1 g Intravenous On Call  .  ceFAZolin (ANCEF) IV  2 g Intravenous Q T,Th,Sat-1800  . darbepoetin (ARANESP) injection - DIALYSIS  200 mcg Intravenous Q Thu-HD  . darunavir  800 mg Oral Q breakfast  . famotidine  20 mg Oral Daily  . feeding supplement  1 Container Oral TID BM  . feeding supplement (NEPRO CARB STEADY)  237 mL Oral BID BM  . heparin  5,000 Units Subcutaneous Q8H  . lamiVUDine  25 mg Oral Daily  . midodrine  10 mg Oral TID WC  . multivitamin  1 tablet Oral Daily  . paricalcitol  8 mcg Intravenous Q T,Th,Sa-HD  . ritonavir  100 mg Oral Q breakfast  . sodium chloride  25 mL/kg Intravenous Once  . sodium chloride  3 mL Intravenous Q12H  . stavudine  20 mg Oral Daily  . sulfamethoxazole-trimethoprim  2 tablet Oral QHS   Continuous Infusions:   . DISCONTD: lactated ringers     PRN Meds:.acetaminophen, albumin human, diphenhydrAMINE, feeding supplement (NEPRO CARB STEADY), guaiFENesin-dextromethorphan, heparin, lidocaine, lidocaine-prilocaine, loperamide, ondansetron (ZOFRAN) IV, ondansetron, pentafluoroprop-tetrafluoroeth, DISCONTD: fentaNYL, DISCONTD: fentaNYL, DISCONTD: heparin 6000 unit irrigation, DISCONTD: heparin, DISCONTD: lidocaine-EPINEPHrine, DISCONTD: midazolam, DISCONTD: sodium chloride irrigation  Labs:  CMP     Component Value Date/Time   NA 129* 05/26/2011 0500   K 4.6 05/26/2011 0500   CL 97 05/26/2011 0500   CO2 26 05/26/2011 0500   GLUCOSE 68* 05/26/2011  0500   BUN 28* 05/26/2011 0500   CREATININE 5.07* 05/26/2011 0500   CREATININE 4.98* 02/19/2011 1048   CALCIUM 8.6 05/26/2011 0500   CALCIUM 7.6* 09/14/2007 1321   PROT 8.0 05/25/2011 2030   ALBUMIN 1.2* 05/26/2011 0500   AST 39* 05/25/2011 2030   ALT <5 05/25/2011 2030   ALKPHOS 221* 05/25/2011 2030   BILITOT 0.2* 05/25/2011 2030   GFRNONAA 9* 05/26/2011 0500   GFRAA 10* 05/26/2011 0500   Phosphorus  Date/Time Value Range Status  05/26/2011  5:00 AM 3.9  2.3-4.6 (mg/dL) Final     Intake/Output Summary (Last 24 hours) at 05/26/11 1606 Last data filed at 05/26/11 1305  Gross per 24 hour  Intake    120 ml  Output      0 ml  Net    120 ml    Weight Status:  124 lbs, stable since 5/16  Re-estimated needs:  1700-1900 kcal, 79-92 gm protein  Nutrition Dx:  Malnutrition, ongoing  Goal:  PO intake to meet 90-100% of estimated nutrition needs, unmet  Intervention:   1. D/C Nepro shake and Ensure pudding 2. Add Ensure Complete BID 3. RD will continue to follow  Monitor:  PO intake, weight, labs, I/O's   Coralee Rud Pager #:  [phone]

## 2011-05-26 NOTE — Progress Notes (Signed)
OT note:  Pt had dialysis catheter placed today.  Will reattempt another day.  [place], OTR/L [id number] 05/26/2011

## 2011-05-26 NOTE — Anesthesia Preprocedure Evaluation (Signed)
Anesthesia Evaluation  Patient identified by MRN, date of birth, ID band Patient awake    Reviewed: Allergy & Precautions, H&P , NPO status , Patient's Chart, lab work & pertinent test results  Airway Mallampati: II TM Distance: >3 FB Neck ROM: Full    Dental No notable dental hx. (+) Edentulous Upper and Edentulous Lower   Pulmonary neg pulmonary ROS,  breath sounds clear to auscultation  Pulmonary exam normal       Cardiovascular hypertension, On Medications Rhythm:Regular Rate:Normal     Neuro/Psych negative neurological ROS  negative psych ROS   GI/Hepatic negative GI ROS, Neg liver ROS,   Endo/Other  negative endocrine ROS  Renal/GU CRF and DialysisRenal disease  negative genitourinary   Musculoskeletal   Abdominal   Peds  Hematology negative hematology ROS (+) HIV,   Anesthesia Other Findings   Reproductive/Obstetrics negative OB ROS                           Anesthesia Physical Anesthesia Plan  ASA: III  Anesthesia Plan: MAC   Post-op Pain Management:    Induction: Intravenous  Airway Management Planned: Mask  Additional Equipment:   Intra-op Plan:   Post-operative Plan:   Informed Consent: I have reviewed the patients History and Physical, chart, labs and discussed the procedure including the risks, benefits and alternatives for the proposed anesthesia with the patient or authorized representative who has indicated his/her understanding and acceptance.     Plan Discussed with: CRNA and Surgeon  Anesthesia Plan Comments:         Anesthesia Quick Evaluation

## 2011-05-26 NOTE — Anesthesia Postprocedure Evaluation (Signed)
  Anesthesia Post-op Note  Patient: [name]  Procedure(s) Performed: Procedure(s) (LRB): INSERTION OF DIALYSIS CATHETER (N/A)  Patient Location: PACU and Nursing Unit  Anesthesia Type: Straight local  Level of Consciousness: awake, alert  and oriented  Airway and Oxygen Therapy: Patient Spontanous Breathing  Post-op Pain: none  Post-op Assessment: Post-op Vital signs reviewed  Post-op Vital Signs: Reviewed and stable  Complications: No apparent anesthesia complications

## 2011-05-26 NOTE — Progress Notes (Signed)
Verified cath placement before starting hemodialysis tx.

## 2011-05-26 NOTE — Progress Notes (Signed)
Monique from [id number] took possession of patients purse

## 2011-05-26 NOTE — Transfer of Care (Signed)
Immediate Anesthesia Transfer of Care Note  Patient: [name]  Procedure(s) Performed: Procedure(s) (LRB): INSERTION OF DIALYSIS CATHETER (N/A)  Patient Location: PACU and Nursing Unit  Anesthesia Type: Local anesthetic only  Level of Consciousness: awake, alert  and oriented  Airway & Oxygen Therapy: Patient Spontanous Breathing  Post-op Assessment: Report called to floor RN. PACU staff will transport pt to floor. Pt received no IV medication.   Post vital signs: Reviewed and stable  Complications: No apparent anesthesia complications

## 2011-05-26 NOTE — Op Note (Signed)
OPERATIVE NOTE  PROCEDURE: 1. right internal jugular vein vein tunneled dialysis catheter placement 2. right internal jugular vein vein cannulation under ultrasound guidance  PRE-OPERATIVE DIAGNOSIS: end-stage renal failure  POST-OPERATIVE DIAGNOSIS: same as above  SURGEON: Leatha Rohner LIANG-YU, MD  ANESTHESIA: local and MAC  ESTIMATED BLOOD LOSS: minimal  FINDING(S): 1.  Tips of the catheter in the right atrium on fluoroscopy 2.  No obvious pneumothorax on fluoroscopy  SPECIMEN(S):  none  INDICATIONS:   [name] is a 56 y.o. female who presents with end stage renal disease.   He previously infected left tunneled dialysis catheter was recently removed.  The patient presents for tunneled dialysis catheter placement.  The patient is aware the risks of tunneled dialysis catheter placement include but are not limited to: bleeding, infection, central venous injury, pneumothorax, possible venous stenosis, possible malpositioning in the venous system, and possible infections related to long-term catheter presence.  The patient was aware of these risks and agreed to proceed.  DESCRIPTION: After written full informed consent was obtained from the patient, the patient was taken back to the operating room.  Prior to induction, the patient was given IV antibiotics.  After obtaining adequate sedation, the patient was prepped and draped in the standard fashion for a chest or neck tunneled dialysis catheter placement.  I anesthesized the neck cannulation site with local anesthetic, then under ultrasound guidance, the right internal jugular vein vein was cannulated with the 18 gauge needle.  A J-wire was then placed down in the inferior vena cava under fluroscopic guidance.  The wire was then secured in place with a clamp to the drapes.  The cannulation site, the catheter exit site, and tract for the subcutaneous tunnel were then anesthesized with a total of 20 cc of a 1:1 mixture of 0.5% Marcaine  without epinepherine and 1% Lidocaine with epinepherine.  I then made stab incisions are the neck and exit sites.  I dissected from the chest to the neck and dilated the subcutaneous tunnel with a plastic dilator.  The wire was then unclamped and I removed the needle.  The skin tract and venotomy was dilated serially with dilators.  Finally, the dilator-sheath was placed under fluroscopic guidance into the superior vena cava.  The dilator and wire were removed.  A 23 cm Diatek catheter was placed under fluoroscopic guidance down into the right atrium.  The sheath was broken and peeled away while holding the catheter cuff at the level of the skin.  The back end of this catheter was transected, revealing the two lumens of this catheter.  The ports were docked onto these two lumens.  The catheter hub was then screwed into place.  Each port was tested by aspirating and flushing.  No resistance was noted.  Each port was then thoroughly flushed with heparinized saline.  The catheter was secured in placed with two interrupted stitches of 3-0 Nylon tied to the catheter.  The neck incision was closed with a U-stitch of 4-0 Monocryl.  The neck and chest incision were cleaned and sterile bandages applied.  Each port was then loaded with concentrated heparin (1000 Units/mL) at the manufacturer recommended volumes to each port.  Sterile caps were applied to each port.  On completion fluoroscopy, the tips of the catheter were in the right atrium, and there was no evidence of pneumothorax.  COMPLICATIONS: none  CONDITION: stable   Adele Barthel, MD [facility] Office: [phone] Pager: [phone]  05/26/2011, 10:08 AM

## 2011-05-26 NOTE — Progress Notes (Signed)
Subjective:  Co lower back pain "because of this bed" Objective Vital signs in last 24 hours: Filed Vitals:   05/25/11 2131 05/26/11 0416 05/26/11 1145 05/26/11 1300  BP: 136/69 140/72 105/66 123/71  Pulse: 57 60 79 67  Temp: 97.7 F (36.5 C) 97.1 F (36.2 C)  97.6 F (36.4 C)  TempSrc: Oral Oral  Oral  Resp: 20 20  19   Height:      Weight:  56.2 kg (123 lb 14.4 oz)    SpO2: 97% 97%  98%   Weight change: 0.1 kg (3.5 oz)  Intake/Output Summary (Last 24 hours) at 05/26/11 1418 Last data filed at 05/26/11 1305  Gross per 24 hour  Intake    120 ml  Output      0 ml  Net    120 ml   Labs: Basic Metabolic Panel:  Lab 0000000 0500 05/25/11 2030 05/24/11 0530 05/20/11 1022  NA 129* 130* 133* --  K 4.6 4.5 3.9 --  CL 97 97 100 --  CO2 26 27 27  --  GLUCOSE 68* 68* 77 --  BUN 28* 27* 18 --  CREATININE 5.07* 4.82* 3.30* --  CALCIUM 8.6 8.6 8.7 --  ALB -- -- -- --  PHOS 3.9 -- 2.2* 1.5*   Liver Function Tests:  Lab 05/26/11 0500 05/25/11 2030 05/24/11 0530 05/23/11 0550 05/22/11 0900  AST -- 39* -- 40* 42*  ALT -- <5 -- 10 13  ALKPHOS -- 221* -- 276* 309*  BILITOT -- 0.2* -- 0.3 0.3  PROT -- 8.0 -- 7.8 8.0  ALBUMIN 1.2* 1.3* 1.3* -- --   No results found for this basename: LIPASE:3,AMYLASE:3 in the last 168 hours No results found for this basename: AMMONIA:3 in the last 168 hours CBC:  Lab 05/26/11 0500 05/25/11 2030 05/24/11 0530 05/20/11 1022  WBC 4.0 4.7 3.8* --  NEUTROABS -- -- -- --  HGB 8.5* 8.3* 8.1* --  HCT 27.0* 25.8* 25.5* --  MCV 94.4 93.1 94.4 92.3  PLT 204 192 140* --   Cardiac Enzymes: No results found for this basename: CKTOTAL:5,CKMB:5,CKMBINDEX:5,TROPONINI:5 in the last 168 hours CBG: No results found for this basename: GLUCAP:5 in the last 168 hours  Iron Studies: No results found for this basename: IRON,TIBC,TRANSFERRIN,FERRITIN in the last 72 hours Studies/Results: Dg Chest Port 1 View  05/26/2011  *RADIOLOGY REPORT*  Clinical Data: TDC  placement  PORTABLE CHEST - 1 VIEW  Comparison: 05/19/2011  Findings: Cardiomediastinal silhouette is stable.  There is a dual lumen right IJ catheter with tip in right atrium.  No diagnostic pneumothorax.  Bilateral perihilar airspace disease right greater than left again noted.  Surgical clips in the left axilla again noted.  IMPRESSION: Dual lumen right IJ catheter with tip in right atrium.  No diagnostic pneumothorax.  Again noted bilateral perihilar airspace disease right greater than left.  Original Report Authenticated By: Lahoma Crocker, M.D.   Medications:    . DISCONTD: lactated ringers        . acidophilus  1 capsule Oral Daily  . antiseptic oral rinse  15 mL Mouth Rinse BID  . aspirin  81 mg Oral Daily  .  ceFAZolin (ANCEF) IV  1 g Intravenous On Call  .  ceFAZolin (ANCEF) IV  2 g Intravenous Q T,Th,Sat-1800  . darbepoetin (ARANESP) injection - DIALYSIS  200 mcg Intravenous Q Thu-HD  . darunavir  800 mg Oral Q breakfast  . famotidine  20 mg Oral Daily  . feeding supplement  1 Container Oral TID BM  . feeding supplement (NEPRO CARB STEADY)  237 mL Oral BID BM  . heparin  5,000 Units Subcutaneous Q8H  . lamiVUDine  25 mg Oral Daily  . midodrine  10 mg Oral TID WC  . multivitamin  1 tablet Oral Daily  . paricalcitol  8 mcg Intravenous Q T,Th,Sa-HD  . ritonavir  100 mg Oral Q breakfast  . sodium chloride  25 mL/kg Intravenous Once  . sodium chloride  3 mL Intravenous Q12H  . stavudine  20 mg Oral Daily  . sulfamethoxazole-trimethoprim  2 tablet Oral QHS   I  have reviewed scheduled and prn medications.  Physical Exam: General: Alert ,NAD Heart: RRR, 2/6 sem , no rub Lungs: sl decr. bs at bases , left sided faint rales Abdomen: soft, nontender Extremities: Dialysis Access:  Bipedal edema 1+, with muscle wasting/ right ij perm cath nontender,   Problem/Plan: 1. Sepsis 1/4 +MSSA and CNSS bacteremia/MV possible vegetation- course c/w sepsis and possible endocarditis, w  tunnelled HD cath  likely source of infection. S/P Left Perm cath removed 5/16 and today new right perm cath. Also has PNA. . IV antibiotics changed to Ancef.  2. ESRD / TTS at [place]- no dialysis this weekend, HD today with new right perm cath . 3. Chronic hypotension/Volume -bp 123/71,on Midodrine due to chronic hypotension issues.  4. Anemia - Hgb 8.5  on outpatient Epogen 10,400 U per HD and weekly Venofer. Aranesp 200 mcg here, and weekly IV Fe; recheck in am. 5. Metabolic bone disease - Ca 8.6, P 3.5 was 2,2 and prior1.55/14 on Zemplar 8 mcg per HD. Will continue. HD records show phoslo 1ac and tums 3ac with meals. Binders on hold for now. 6. Nutrition - Alb 1.2, on Megace as outpatient.- diet liberalized to regular - agree - needs foods easy to chew 7. HIV/AIDS - on antiviral meds + bactrim followed by ID. Viral load extremely high (1.5 million)  Ernest Haber, PA-C [facility] [phone] 05/26/2011,2:18 PM  LOS: 8 days   Patient seen and examined and agree with assessment and plan as above.  Kelly Splinter  MD [facility] [phone] pgr    [phone] cell 05/26/2011, 4:41 PM

## 2011-05-26 NOTE — Progress Notes (Signed)
Subjective: Patient feels ok . Still feels tired and does not have good appetite. No nausea, vomiting or abdominal pain. No diarrhea.  No acute events over night. NPO after MN for HD catheter placement.  Objective: Vital signs in last 24 hours: Filed Vitals:   05/25/11 2131 05/26/11 0416 05/26/11 1145 05/26/11 1300  BP: 136/69 140/72 105/66 123/71  Pulse: 57 60 79 67  Temp: 97.7 F (36.5 C) 97.1 F (36.2 C)  97.6 F (36.4 C)  TempSrc: Oral Oral  Oral  Resp: 20 20  19   Height:      Weight:  123 lb 14.4 oz (56.2 kg)    SpO2: 97% 97%  98%   Weight change: 3.5 oz (0.1 kg)  Intake/Output Summary (Last 24 hours) at 05/26/11 1424 Last data filed at 05/26/11 1305  Gross per 24 hour  Intake    120 ml  Output      0 ml  Net    120 ml   General: NAD. Cathectic and chronic ill appearing  Skin: No petechiae is noted on MM. No splinter hemorrhage, [place] lesions or Osler nodes noted  Scattered skin abrasions on her back. Right FA a healing ulcer noted  Lungs: CTA B/L, left chest HD catheter.  Heart: RRR, S1, S2, no murmur  Abd: Soft, tenderness to palpation suprapubic area. BS x 4  MSK: Mild tenderness to palpation on bilateral plantar side of the feet. Ext: PPP+1, no edema.   Lab Results: Basic Metabolic Panel:  Lab 0000000 0500 05/25/11 2030 05/24/11 0530  [name] 129* 130* --  K 4.6 4.5 --  CL 97 97 --  CO2 26 27 --  GLUCOSE 68* 68* --  BUN 28* 27* --  CREATININE 5.07* 4.82* --  CALCIUM 8.6 8.6 --  MG -- -- --  PHOS 3.9 -- 2.2*   Liver Function Tests:  Lab 05/26/11 0500 05/25/11 2030 05/23/11 0550  AST -- 39* 40*  ALT -- <5 10  ALKPHOS -- 221* 276*  BILITOT -- 0.2* 0.3  PROT -- 8.0 7.8  ALBUMIN 1.2* 1.3* --   CBC:  Lab 05/26/11 0500 05/25/11 2030  WBC 4.0 4.7  NEUTROABS -- --  HGB 8.5* 8.3*  HCT 27.0* 25.8*  MCV 94.4 93.1  PLT 204 192    Micro Results: Recent Results (from the past 240 hour(s))  CULTURE, BLOOD (ROUTINE X 2)     Status: Normal   Collection Time   05/18/11  1:05 PM      Component Value Range Status Comment   Specimen Description BLOOD LEFT ARM   Final    Special Requests BOTTLES DRAWN AEROBIC AND ANAEROBIC 10CC   Final    Culture  Setup Time [id number]   Final    Culture     Final    Value: STAPHYLOCOCCUS AUREUS     Note: RIFAMPIN AND GENTAMICIN SHOULD NOT BE USED AS SINGLE DRUGS FOR TREATMENT OF STAPH INFECTIONS.     STAPHYLOCOCCUS SPECIES (COAGULASE NEGATIVE)     Note: THE SIGNIFICANCE OF ISOLATING THIS ORGANISM FROM A SINGLE SET OF BLOOD CULTURES WHEN MULTIPLE SETS ARE DRAWN IS UNCERTAIN. PLEASE NOTIFY THE MICROBIOLOGY DEPARTMENT WITHIN ONE WEEK IF SPECIATION AND SENSITIVITIES ARE REQUIRED.     Note: Gram Stain Report Called to,Read Back By and Verified With: KELSEY WHITE @1916  ON 05/19/11 BY MCLET   Report Status 05/22/2011 FINAL   Final    Organism ID, Bacteria STAPHYLOCOCCUS AUREUS   Final   CULTURE, BLOOD (ROUTINE X  2)     Status: Normal   Collection Time   05/18/11  1:11 PM      Component Value Range Status Comment   Specimen Description BLOOD RIGHT ARM   Final    Special Requests BOTTLES DRAWN AEROBIC ONLY [facility]   Final    Culture  Setup Time [id number]   Final    Culture NO GROWTH 5 DAYS   Final    Report Status 05/24/2011 FINAL   Final   URINE CULTURE     Status: Normal   Collection Time   05/18/11  3:42 PM      Component Value Range Status Comment   Specimen Description URINE, CLEAN CATCH   Final    Special Requests NONE   Final    Culture  Setup Time [id number]   Final    Colony Count NO GROWTH   Final    Culture NO GROWTH   Final    Report Status 05/20/2011 FINAL   Final   CULTURE, BLOOD (ROUTINE X 2)     Status: Normal   Collection Time   05/18/11  5:08 PM      Component Value Range Status Comment   Specimen Description BLOOD LEFT ARM   Final    Special Requests BOTTLES DRAWN AEROBIC AND ANAEROBIC 10CC   Final    Culture  Setup Time [id number]   Final    Culture NO GROWTH 5 DAYS    Final    Report Status 05/25/2011 FINAL   Final   CULTURE, BLOOD (ROUTINE X 2)     Status: Normal   Collection Time   05/18/11  5:23 PM      Component Value Range Status Comment   Specimen Description BLOOD RIGHT HAND   Final    Special Requests BOTTLES DRAWN AEROBIC AND ANAEROBIC 10CC   Final    Culture  Setup Time [id number]   Final    Culture NO GROWTH 5 DAYS   Final    Report Status 05/25/2011 FINAL   Final   MRSA PCR SCREENING     Status: Normal   Collection Time   05/18/11  5:48 PM      Component Value Range Status Comment   MRSA by PCR NEGATIVE  NEGATIVE  Final   CLOSTRIDIUM DIFFICILE BY PCR     Status: Normal   Collection Time   05/20/11  5:29 AM      Component Value Range Status Comment   C difficile by pcr NEGATIVE  NEGATIVE  Final   CATH TIP CULTURE     Status: Normal   Collection Time   05/22/11  6:28 PM      Component Value Range Status Comment   Specimen Description CATH TIP CHEST LEFT   Final    Special Requests NONE   Final    Culture NO GROWTH 2 DAYS   Final    Report Status 05/25/2011 FINAL   Final   CULTURE, EXPECTORATED SPUTUM-ASSESSMENT     Status: Normal   Collection Time   05/23/11  8:10 AM      Component Value Range Status Comment   Specimen Description SPUTUM   Final    Special Requests EXPECT. SPU   Final    Sputum evaluation     Final    Value: THIS SPECIMEN IS ACCEPTABLE. RESPIRATORY CULTURE REPORT TO FOLLOW.   Report Status 05/23/2011 FINAL   Final   CULTURE, RESPIRATORY     Status: Normal   Collection  Time   05/23/11  8:10 AM      Component Value Range Status Comment   Specimen Description SPUTUM   Final    Special Requests NONE   Final    Gram Stain     Final    Value: ABUNDANT WBC PRESENT,BOTH PMN AND MONONUCLEAR     FEW SQUAMOUS EPITHELIAL CELLS PRESENT     RARE GRAM POSITIVE COCCI IN PAIRS   Culture FEW CANDIDA ALBICANS   Final    Report Status 05/25/2011 FINAL   Final   CULTURE, BLOOD (ROUTINE X 2)     Status: Normal (Preliminary  result)   Collection Time   05/23/11  2:35 PM      Component Value Range Status Comment   Specimen Description BLOOD ARM RIGHT   Final    Special Requests BOTTLES DRAWN AEROBIC AND ANAEROBIC 10CC   Final    Culture  Setup Time [id number]   Final    Culture     Final    Value:        BLOOD CULTURE RECEIVED NO GROWTH TO DATE CULTURE WILL BE HELD FOR 5 DAYS BEFORE ISSUING A FINAL NEGATIVE REPORT   Report Status PENDING   Incomplete   CULTURE, BLOOD (ROUTINE X 2)     Status: Normal (Preliminary result)   Collection Time   05/23/11  2:50 PM      Component Value Range Status Comment   Specimen Description BLOOD RIGHT HAND   Final    Special Requests BOTTLES DRAWN AEROBIC AND ANAEROBIC 10CC   Final    Culture  Setup Time [id number]   Final    Culture     Final    Value:        BLOOD CULTURE RECEIVED NO GROWTH TO DATE CULTURE WILL BE HELD FOR 5 DAYS BEFORE ISSUING A FINAL NEGATIVE REPORT   Report Status PENDING   Incomplete   CLOSTRIDIUM DIFFICILE BY PCR     Status: Normal   Collection Time   05/23/11  4:16 PM      Component Value Range Status Comment   C difficile by pcr NEGATIVE  NEGATIVE  Final    Studies/Results: Dg Chest Port 1 View  05/26/2011  *RADIOLOGY REPORT*  Clinical Data: TDC placement  PORTABLE CHEST - 1 VIEW  Comparison: 05/19/2011  Findings: Cardiomediastinal silhouette is stable.  There is a dual lumen right IJ catheter with tip in right atrium.  No diagnostic pneumothorax.  Bilateral perihilar airspace disease right greater than left again noted.  Surgical clips in the left axilla again noted.  IMPRESSION: Dual lumen right IJ catheter with tip in right atrium.  No diagnostic pneumothorax.  Again noted bilateral perihilar airspace disease right greater than left.  Original Report Authenticated By: Lahoma Crocker, M.D.   Medications: I have reviewed the patient's current medications. Scheduled Meds:   . acidophilus  1 capsule Oral Daily  . antiseptic oral rinse  15 mL Mouth  Rinse BID  . aspirin  81 mg Oral Daily  .  ceFAZolin (ANCEF) IV  1 g Intravenous On Call  .  ceFAZolin (ANCEF) IV  2 g Intravenous Q T,Th,Sat-1800  . darbepoetin (ARANESP) injection - DIALYSIS  200 mcg Intravenous Q Thu-HD  . darunavir  800 mg Oral Q breakfast  . famotidine  20 mg Oral Daily  . feeding supplement  1 Container Oral TID BM  . feeding supplement (NEPRO CARB STEADY)  237 mL Oral BID BM  . heparin  5,000 Units Subcutaneous Q8H  . lamiVUDine  25 mg Oral Daily  . midodrine  10 mg Oral TID WC  . multivitamin  1 tablet Oral Daily  . paricalcitol  8 mcg Intravenous Q T,Th,Sa-HD  . ritonavir  100 mg Oral Q breakfast  . sodium chloride  25 mL/kg Intravenous Once  . sodium chloride  3 mL Intravenous Q12H  . stavudine  20 mg Oral Daily  . sulfamethoxazole-trimethoprim  2 tablet Oral QHS   Continuous Infusions:   . DISCONTD: lactated ringers     PRN Meds:.acetaminophen, albumin human, diphenhydrAMINE, feeding supplement (NEPRO CARB STEADY), guaiFENesin-dextromethorphan, heparin, lidocaine, lidocaine-prilocaine, loperamide, ondansetron (ZOFRAN) IV, ondansetron, pentafluoroprop-tetrafluoroeth, DISCONTD: fentaNYL, DISCONTD: fentaNYL, DISCONTD: heparin 6000 unit irrigation, DISCONTD: heparin, DISCONTD: lidocaine-EPINEPHrine, DISCONTD: midazolam, DISCONTD: sodium chloride irrigation Assessment/Plan:  Disposition Overall poor prognosis due to poorly controlled HIV and severe infections including endocarditis. SNF at discharge.  She will be likely ready for discharge after HD tomorrow. She will be discharged with IV Ancef and po Bactrim.   #. Severe Sepsis , resolved  The etiologies of severe sepsis are likely HCAP vs PCP Vs HD catheter infection vs endocarditis.  - Blood culture>>>1/2 Staph aureus>>>  Ancef with HD- -  for HCAP >>> Ancef  - for possible PCP>>>less likely to obtain sputum sample since she only has nonproductive cough.                                  >>> Bactrim po at  5mg /kg/hour daily for PCP Tx. She will need the treatment for 21 days                                    >>> ABG shows PO2 82 ans Aa gradient of 8>>>no need for steroids for now.   #Bacterremia and bacterial endocarditis  Staph aureus in 1/2 blood culture and two majors and two minors>>>confirmed  The source>>>left chest HD catheter  -HD catheter removed on 05/22/11. New HD catheter placed today - Repeat blood culture x2 since the catheter is removed- negative to date.  - Ancef IV which HD  - will need 6 weeks tx, especially given her poorly controlled HIV.   # HCAP vs PCP PNA , improving  ESRD with HD patient is found to have B/L opacity with R>L on chest ray>>>HCAP  Poorly controlled HIV due to medical noncompliance>>>ca not rule out PCP  - crypto antigen>>>Negative  - Ancef, bactrim see above note  - symptomatic treatment for cough.   # ESRD with HD,per renal   # Diarrhea, resolved - Negative C-diff PCR  - Probiotics  - imodium PRN   # Congestive cardiomyopathy  LV EF 40-45% Per TTE on 05/21/2011- possible small mitral valve vegetation.  Patient is not volume overload on admission. Caution IVF replacement for sepsis and septic shock  -Continue to monitor   # HIV infection  This is a chronic problem. She does have a poor compliance with her medications. Her last CD4 220 and VL [id number] on 02/19/11.  - will continue her home medications  - CD4 130 and VL [id number]   #Thrombocytopenia  Chronic stable. Likely due to end-stage renal disease.   # Anemia of chronic disease, baseline 8-9  Stable  - On Procrit  -Weekly Venofer   # Mild Hypertension  Baseline SBP ranges at 140's. Not on  any medical treatment  .  # Malnutrition, likely due to renal disease  Albumin 1.5 on this admission  - Oral supplement  -Nutritionist consult  - albumin Tx given per renal  -Will not continue Megace since it increase risk of thrombosis especially patient is on Procrit.  # VTE: Heparin      LOS: 8 days   [name] 05/26/2011, 2:24 PM

## 2011-05-26 NOTE — H&P (Signed)
[facility]  Brief Access History and Physical  History of Present Illness  [name] is a 56 y.o. female who presents with chief complaint: end stage renal disease.  The patient recently had a presumed infection tunneled dialysis catheter removed from the left tunneled dialysis catheter.  The patient presents today for placement of new tunneled dialysis catheter.    Past Medical History  Diagnosis Date  . Human immunodeficiency virus (HIV) disease   . Hypertension   . ESRD (end stage renal disease)   . Dialysis patient     pt on dialysis since 2010.  [name] Clostridium difficile infection 04/04/10  . Bacteriuria, asymptomatic 04/04/10    Culture grew VRE sensitive to linesolid   . MGUS (monoclonal gammopathy of unknown significance)   . History of bacteremia     MSSA  . Renal insufficiency   . Anemia     Past Surgical History  Procedure Date  . Av fistula placement   . Insertion of dialysis catheter 12/30/2010    Procedure: INSERTION OF DIALYSIS CATHETER;  Surgeon: Elam Dutch, MD;  Location: [facility];  Service: Vascular;  Laterality: Left;  Insertion of left internal jugular dialysis catheter  . Vascular surgery     History   Social History  . Marital Status: Single    Spouse Name: N/A    Number of Children: N/A  . Years of Education: N/A   Occupational History  . Not on file.   Social History Main Topics  . Smoking status: Never Smoker   . Smokeless tobacco: Never Used  . Alcohol Use: No  . Drug Use: No  . Sexually Active: No   Other Topics Concern  . Not on file   Social History Narrative   Lives with daughter(does not know dx). Oldest daughter does not dx.Current partner of may years,? fidelity    Family History  Problem Relation Age of Onset  . Alcohol abuse      family h/o addiction/alcoholism  . Diabetes Cousin     first degree relatives  . Kidney disease Mother   . Kidney disease Maternal Uncle   . Cancer Sister       No current facility-administered medications on file prior to encounter.   Current Outpatient Prescriptions on File Prior to Encounter  Medication Sig Dispense Refill  . acetaminophen (TYLENOL) 650 MG CR tablet Take 650 mg by mouth every 8 (eight) hours as needed. For pain.      [name] aspirin 81 MG tablet Take 81 mg by mouth daily.        [name] b complex vitamins tablet Take 1 tablet by mouth daily.        [name] b complex-vitamin c-folic acid (NEPHRO-VITE) 0.8 MG TABS Take 0.8 mg by mouth daily.        . darbepoetin (ARANESP, ALBUMIN FREE,) 150 MCG/0.3ML SOLN Inject 150 mcg into the skin. Use weekly for Epogen 20000 units. Dialysis nurse to administer.       . darunavir (PREZISTA) 400 MG tablet Take 2 tablets (800 mg total) by mouth daily with breakfast.  60 tablet  1  . ENSURE (ENSURE) Take 237 mLs by mouth 3 (three) times daily between meals.        . famotidine (PEPCID) 20 MG tablet Take 20 mg by mouth daily.        . iron sucrose (VENOFER) 20 MG/ML injection 100 mg IV on Tuesday HD(HD RN to dispense)       .  lamiVUDine (EPIVIR) 10 MG/ML solution Take 1 teaspoon by mouth every day.       . paricalcitol (ZEMPLAR) 5 MCG/ML injection 4 micrograms IV- dispensed by dialysis nurse.       . ritonavir (NORVIR) 100 MG TABS Take 100 mg by mouth daily with breakfast.       . Stavudine (ZERIT) 1 MG/ML SOLR Take 4 teaspoons(20 ml) by mouth every day.       . sulfamethoxazole-trimethoprim (BACTRIM DS) 800-160 MG per tablet Take 1 tablet by mouth daily. Take 1 tablet by mouth every Monday, Wednesday and Friday.        No Known Allergies  Review of Systems: Kidney Disease, As listed above, otherwise negative.  Physical Examination  Filed Vitals:   05/24/11 2100 05/25/11 0541 05/25/11 2131 05/26/11 0416  BP: 140/78 106/65 136/69 140/72  Pulse: 60 60 57 60  Temp: 98.1 F (36.7 C) 97.9 F (36.6 C) 97.7 F (36.5 C) 97.1 F (36.2 C)  TempSrc: Oral Oral Oral Oral  Resp: 20 20 20 20   Height:      Weight:   123 lb 10.9 oz (56.1 kg)  123 lb 14.4 oz (56.2 kg)  SpO2: 98% 96% 97% 97%   Body mass index is 18.30 kg/(m^2).  General: A&O x 3, WD, cachectic  Pulmonary: Sym exp, good air movt, CTAB, no rales, rhonchi, & wheezing  Cardiac: RRR, Nl S1, S2, no Murmurs, rubs or gallops  Gastrointestinal: soft, NTND, -G/R, - HSM, - masses, - CVAT B  Musculoskeletal: M/S 5/5 throughout , Extremities without ischemic changes   Laboratory BMET    Component Value Date/Time   NA 129* 05/26/2011 0500   K 4.6 05/26/2011 0500   CL 97 05/26/2011 0500   CO2 26 05/26/2011 0500   GLUCOSE 68* 05/26/2011 0500   BUN 28* 05/26/2011 0500   CREATININE 5.07* 05/26/2011 0500   CREATININE 4.98* 02/19/2011 1048   CALCIUM 8.6 05/26/2011 0500   CALCIUM 7.6* 09/14/2007 1321   GFRNONAA 9* 05/26/2011 0500   GFRAA 10* 05/26/2011 0500    CBC    Component Value Date/Time   WBC 4.0 05/26/2011 0500   RBC 2.86* 05/26/2011 0500   HGB 8.5* 05/26/2011 0500   HCT 27.0* 05/26/2011 0500   PLT 204 05/26/2011 0500   MCV 94.4 05/26/2011 0500   MCH 29.7 05/26/2011 0500   MCHC 31.5 05/26/2011 0500   RDW 20.5* 05/26/2011 0500   LYMPHSABS 1.4 05/18/2011 1259   MONOABS 0.9 05/18/2011 1259   EOSABS 0.0 05/18/2011 1259   BASOSABS 0.0 05/18/2011 1259   Medical Decision Making  Shalene A Yazel is a 56 y.o. female who presents with: end stage renal disease .   The patient is scheduled for: tunneled dialysis catheter placement  The patient is aware the risks of tunneled dialysis catheter placement include but are not limited to: bleeding, infection, central venous injury, pneumothorax, possible venous stenosis, possible malpositioning in the venous system, and possible infections related to long-term catheter presence. The patient was aware of these risks and agreed to proceed.  The patient is aware of the risks and agrees to proceed.  Adele Barthel, MD [facility] Office: [phone] Pager: [phone]  05/26/2011,  9:10 AM

## 2011-05-27 ENCOUNTER — Encounter (HOSPITAL_COMMUNITY): Payer: Self-pay | Admitting: Vascular Surgery

## 2011-05-27 ENCOUNTER — Inpatient Hospital Stay (HOSPITAL_COMMUNITY): Payer: Medicaid Other

## 2011-05-27 DIAGNOSIS — I38 Endocarditis, valve unspecified: Secondary | ICD-10-CM | POA: Diagnosis present

## 2011-05-27 DIAGNOSIS — I429 Cardiomyopathy, unspecified: Secondary | ICD-10-CM | POA: Diagnosis present

## 2011-05-27 LAB — RENAL FUNCTION PANEL
Albumin: 1.2 g/dL — ABNORMAL LOW (ref 3.5–5.2)
BUN: 11 mg/dL (ref 6–23)
CO2: 27 mEq/L (ref 19–32)
Calcium: 7.8 mg/dL — ABNORMAL LOW (ref 8.4–10.5)
Chloride: 102 mEq/L (ref 96–112)
Creatinine, Ser: 2.74 mg/dL — ABNORMAL HIGH (ref 0.50–1.10)
GFR calc Af Amer: 21 mL/min — ABNORMAL LOW (ref >90)
GFR calc non Af Amer: 19 mL/min — ABNORMAL LOW (ref >90)
Glucose, Bld: 87 mg/dL (ref 70–99)
Phosphorus: 2.7 mg/dL (ref 2.3–4.6)
Potassium: 3.7 mEq/L (ref 3.5–5.1)
Sodium: 134 mEq/L — ABNORMAL LOW (ref 135–145)

## 2011-05-27 LAB — BASIC METABOLIC PANEL
BUN: 14 mg/dL (ref 6–23)
CO2: 27 mEq/L (ref 19–32)
Calcium: 8.2 mg/dL — ABNORMAL LOW (ref 8.4–10.5)
Chloride: 99 mEq/L (ref 96–112)
Creatinine, Ser: 3.28 mg/dL — ABNORMAL HIGH (ref 0.50–1.10)
GFR calc Af Amer: 17 mL/min — ABNORMAL LOW (ref >90)
GFR calc non Af Amer: 15 mL/min — ABNORMAL LOW (ref >90)
Glucose, Bld: 97 mg/dL (ref 70–99)
Potassium: 4.2 mEq/L (ref 3.5–5.1)
Sodium: 133 mEq/L — ABNORMAL LOW (ref 135–145)

## 2011-05-27 LAB — CBC
HCT: 22.1 % — ABNORMAL LOW (ref 36.0–46.0)
Hemoglobin: 7 g/dL — ABNORMAL LOW (ref 12.0–15.0)
MCH: 30 pg (ref 26.0–34.0)
MCHC: 31.7 g/dL (ref 30.0–36.0)
MCV: 94.8 fL (ref 78.0–100.0)
Platelets: 195 10*3/uL (ref 150–400)
RBC: 2.33 MIL/uL — ABNORMAL LOW (ref 3.87–5.11)
RDW: 21.1 % — ABNORMAL HIGH (ref 11.5–15.5)
WBC: 3.7 10*3/uL — ABNORMAL LOW (ref 4.0–10.5)

## 2011-05-27 LAB — PREPARE RBC (CROSSMATCH)

## 2011-05-27 LAB — CARDIAC PANEL(CRET KIN+CKTOT+MB+TROPI)
CK, MB: 2.7 ng/mL (ref 0.3–4.0)
Relative Index: INVALID (ref 0.0–2.5)
Total CK: 42 U/L (ref 7–177)
Troponin I: <0.3 ng/mL (ref <0.30)

## 2011-05-27 LAB — MAGNESIUM: Magnesium: 1.9 mg/dL (ref 1.5–2.5)

## 2011-05-27 MED ORDER — METOPROLOL TARTRATE 12.5 MG HALF TABLET
12.5000 mg | ORAL_TABLET | Freq: Two times a day (BID) | ORAL | Status: DC
  Administered 2011-05-28 (×2): 12.5 mg via ORAL
  Filled 2011-05-27 (×5): qty 1

## 2011-05-27 MED ORDER — HEPARIN SODIUM (PORCINE) 1000 UNIT/ML DIALYSIS: 2000.0000 [IU] | INTRAMUSCULAR | Status: DC | PRN

## 2011-05-27 MED ORDER — MAGNESIUM OXIDE 400 (241.3 MG) MG PO TABS
400.0000 mg | ORAL_TABLET | Freq: Two times a day (BID) | ORAL | Status: AC
  Administered 2011-05-28 – 2011-05-30 (×6): 400 mg via ORAL
  Filled 2011-05-27 (×6): qty 1

## 2011-05-27 MED ORDER — PARICALCITOL 5 MCG/ML IV SOLN
INTRAVENOUS | Status: AC
  Administered 2011-05-27: 8 ug via INTRAVENOUS
  Filled 2011-05-27: qty 2

## 2011-05-27 MED ORDER — CEFAZOLIN SODIUM-DEXTROSE 2-3 GM-% IV SOLR
2.0000 g | INTRAVENOUS | Status: DC
  Administered 2011-05-28 – 2011-05-29 (×2): 2 g via INTRAVENOUS
  Filled 2011-05-27 (×2): qty 50

## 2011-05-27 NOTE — Progress Notes (Signed)
Pt currently with no SNF offers despite being faxed to multiple counties. Per RNCM, pt's insurance will pay for HHRN/aide, but not PT/OT as need does not stem from orthopaedic diagnosis. RNCM also reports MD will need to call outpt HD center to set up IV antibiotics to be administered at time of HD. CSW to update MD and continue to follow and assist with d/c needs Wandra Feinstein, MSW, [place] (coverage)

## 2011-05-27 NOTE — Progress Notes (Addendum)
Subjective:  Patient feels ok. Still has generalized tiredness. Appetite improving. No abdominal pain or vomiting. Nurse reports that patient had tachyarrhythemia at 0945 this am. Patient was resting prior to the event.  she was asymptomatic with stable VVS.  Objective: Vital signs in last 24 hours: Filed Vitals:   05/26/11 2100 05/27/11 0535 05/27/11 0946 05/27/11 1332  BP: 102/62 115/70 114/70 111/71  Pulse: 55 67 68 73  Temp: 97.3 F (36.3 C) 98.2 F (36.8 C) 97.7 F (36.5 C) 97.5 F (36.4 C)  TempSrc: Oral Oral Oral Oral  Resp: 18 18 18 18   Height:      Weight:  124 lb 12.5 oz (56.6 kg)    SpO2: 97% 97% 100% 100%   Weight change: 3.5 oz (0.1 kg)  Intake/Output Summary (Last 24 hours) at 05/27/11 1616 Last data filed at 05/27/11 1300  Gross per 24 hour  Intake    700 ml  Output    691 ml  Net      9 ml   General: NAD. Cathectic and chronic ill appearing  Skin: No petechiae is noted on MM. No splinter hemorrhage, [place] lesions or Osler nodes noted  Scattered skin abrasions on her back. Right FA a healing ulcer noted  Lungs: CTA B/L, left chest HD catheter.  Heart: RRR, S1, S2, 2-3 systolic murmur noted.  Abd: Soft, tenderness to palpation suprapubic area. BS x 4  MSK: Mild tenderness to palpation on bilateral plantar side of the feet. Ext: PPP+1, no edema.    Lab Results: Basic Metabolic Panel:  Lab [id number] 05/26/11 0500  Clive Parcel 134* 129*  K 3.7 4.6  CL 102 97  CO2 27 26  GLUCOSE 87 68*  BUN 11 28*  CREATININE 2.74* 5.07*  CALCIUM 7.8* 8.6  MG -- --  PHOS 2.7 3.9   Liver Function Tests:  Lab 05/27/11 0515 05/26/11 0500 05/25/11 2030 05/23/11 0550  AST -- -- 39* 40*  ALT -- -- <5 10  ALKPHOS -- -- 221* 276*  BILITOT -- -- 0.2* 0.3  PROT -- -- 8.0 7.8  ALBUMIN 1.2* 1.2* -- --   CBC:  Lab 05/27/11 0515 05/26/11 0500  WBC 3.7* 4.0  NEUTROABS -- --  HGB 7.0* 8.5*  HCT 22.1* 27.0*  MCV 94.8 94.4  PLT 195 204   Cardiac Enzymes:  Lab  05/27/11 1511  CKTOTAL 42  CKMB 2.7  CKMBINDEX --  TROPONINI <0.30    Micro Results: Recent Results (from the past 240 hour(s))  CULTURE, BLOOD (ROUTINE X 2)     Status: Normal   Collection Time   05/18/11  1:05 PM      Component Value Range Status Comment   Specimen Description BLOOD LEFT ARM   Final    Special Requests BOTTLES DRAWN AEROBIC AND ANAEROBIC 10CC   Final    Culture  Setup Time [id number]   Final    Culture     Final    Value: STAPHYLOCOCCUS AUREUS     Note: RIFAMPIN AND GENTAMICIN SHOULD NOT BE USED AS SINGLE DRUGS FOR TREATMENT OF STAPH INFECTIONS.     STAPHYLOCOCCUS SPECIES (COAGULASE NEGATIVE)     Note: THE SIGNIFICANCE OF ISOLATING THIS ORGANISM FROM A SINGLE SET OF BLOOD CULTURES WHEN MULTIPLE SETS ARE DRAWN IS UNCERTAIN. PLEASE NOTIFY THE MICROBIOLOGY DEPARTMENT WITHIN ONE WEEK IF SPECIATION AND SENSITIVITIES ARE REQUIRED.     Note: Gram Stain Report Called to,Read Back By and Verified With: KELSEY WHITE @1916  ON 05/19/11  BY MCLET   Report Status 05/22/2011 FINAL   Final    Organism ID, Bacteria STAPHYLOCOCCUS AUREUS   Final   CULTURE, BLOOD (ROUTINE X 2)     Status: Normal   Collection Time   05/18/11  1:11 PM      Component Value Range Status Comment   Specimen Description BLOOD RIGHT ARM   Final    Special Requests BOTTLES DRAWN AEROBIC ONLY [facility]   Final    Culture  Setup Time [id number]   Final    Culture NO GROWTH 5 DAYS   Final    Report Status 05/24/2011 FINAL   Final   URINE CULTURE     Status: Normal   Collection Time   05/18/11  3:42 PM      Component Value Range Status Comment   Specimen Description URINE, CLEAN CATCH   Final    Special Requests NONE   Final    Culture  Setup Time [id number]   Final    Colony Count NO GROWTH   Final    Culture NO GROWTH   Final    Report Status 05/20/2011 FINAL   Final   CULTURE, BLOOD (ROUTINE X 2)     Status: Normal   Collection Time   05/18/11  5:08 PM      Component Value Range Status Comment    Specimen Description BLOOD LEFT ARM   Final    Special Requests BOTTLES DRAWN AEROBIC AND ANAEROBIC 10CC   Final    Culture  Setup Time [id number]   Final    Culture NO GROWTH 5 DAYS   Final    Report Status 05/25/2011 FINAL   Final   CULTURE, BLOOD (ROUTINE X 2)     Status: Normal   Collection Time   05/18/11  5:23 PM      Component Value Range Status Comment   Specimen Description BLOOD RIGHT HAND   Final    Special Requests BOTTLES DRAWN AEROBIC AND ANAEROBIC 10CC   Final    Culture  Setup Time [id number]   Final    Culture NO GROWTH 5 DAYS   Final    Report Status 05/25/2011 FINAL   Final   MRSA PCR SCREENING     Status: Normal   Collection Time   05/18/11  5:48 PM      Component Value Range Status Comment   MRSA by PCR NEGATIVE  NEGATIVE  Final   CLOSTRIDIUM DIFFICILE BY PCR     Status: Normal   Collection Time   05/20/11  5:29 AM      Component Value Range Status Comment   C difficile by pcr NEGATIVE  NEGATIVE  Final   CATH TIP CULTURE     Status: Normal   Collection Time   05/22/11  6:28 PM      Component Value Range Status Comment   Specimen Description CATH TIP CHEST LEFT   Final    Special Requests NONE   Final    Culture NO GROWTH 2 DAYS   Final    Report Status 05/25/2011 FINAL   Final   CULTURE, EXPECTORATED SPUTUM-ASSESSMENT     Status: Normal   Collection Time   05/23/11  8:10 AM      Component Value Range Status Comment   Specimen Description SPUTUM   Final    Special Requests EXPECT. SPU   Final    Sputum evaluation     Final    Value: THIS SPECIMEN IS  ACCEPTABLE. RESPIRATORY CULTURE REPORT TO FOLLOW.   Report Status 05/23/2011 FINAL   Final   CULTURE, RESPIRATORY     Status: Normal   Collection Time   05/23/11  8:10 AM      Component Value Range Status Comment   Specimen Description SPUTUM   Final    Special Requests NONE   Final    Gram Stain     Final    Value: ABUNDANT WBC PRESENT,BOTH PMN AND MONONUCLEAR     FEW SQUAMOUS EPITHELIAL CELLS PRESENT      RARE GRAM POSITIVE COCCI IN PAIRS   Culture FEW CANDIDA ALBICANS   Final    Report Status 05/25/2011 FINAL   Final   CULTURE, BLOOD (ROUTINE X 2)     Status: Normal (Preliminary result)   Collection Time   05/23/11  2:35 PM      Component Value Range Status Comment   Specimen Description BLOOD ARM RIGHT   Final    Special Requests BOTTLES DRAWN AEROBIC AND ANAEROBIC 10CC   Final    Culture  Setup Time [id number]   Final    Culture     Final    Value:        BLOOD CULTURE RECEIVED NO GROWTH TO DATE CULTURE WILL BE HELD FOR 5 DAYS BEFORE ISSUING A FINAL NEGATIVE REPORT   Report Status PENDING   Incomplete   CULTURE, BLOOD (ROUTINE X 2)     Status: Normal (Preliminary result)   Collection Time   05/23/11  2:50 PM      Component Value Range Status Comment   Specimen Description BLOOD RIGHT HAND   Final    Special Requests BOTTLES DRAWN AEROBIC AND ANAEROBIC 10CC   Final    Culture  Setup Time [id number]   Final    Culture     Final    Value:        BLOOD CULTURE RECEIVED NO GROWTH TO DATE CULTURE WILL BE HELD FOR 5 DAYS BEFORE ISSUING A FINAL NEGATIVE REPORT   Report Status PENDING   Incomplete   CLOSTRIDIUM DIFFICILE BY PCR     Status: Normal   Collection Time   05/23/11  4:16 PM      Component Value Range Status Comment   C difficile by pcr NEGATIVE  NEGATIVE  Final    Studies/Results: Dg Chest Port 1 View  05/26/2011  *RADIOLOGY REPORT*  Clinical Data: TDC placement  PORTABLE CHEST - 1 VIEW  Comparison: 05/19/2011  Findings: Cardiomediastinal silhouette is stable.  There is a dual lumen right IJ catheter with tip in right atrium.  No diagnostic pneumothorax.  Bilateral perihilar airspace disease right greater than left again noted.  Surgical clips in the left axilla again noted.  IMPRESSION: Dual lumen right IJ catheter with tip in right atrium.  No diagnostic pneumothorax.  Again noted bilateral perihilar airspace disease right greater than left.  Original Report Authenticated By:  Lahoma Crocker, M.D.   Dg Fluoro Guide Cv Line-no Report  05/26/2011  CLINICAL DATA: Insertion of Dialysis catheter   FLOURO GUIDE CV LINE  Fluoroscopy was utilized by the requesting physician.  No radiographic  interpretation.     Medications: I have reviewed the patient's current medications. Scheduled Meds:   . acetaminophen      . acidophilus  1 capsule Oral Daily  . antiseptic oral rinse  15 mL Mouth Rinse BID  . aspirin  81 mg Oral Daily  .  ceFAZolin (ANCEF) IV  1 g Intravenous On Call  .  ceFAZolin (ANCEF) IV  2 g Intravenous Q T,Th,Sat-1800  . darbepoetin (ARANESP) injection - DIALYSIS  200 mcg Intravenous Q Thu-HD  . darunavir  800 mg Oral Q breakfast  . famotidine  20 mg Oral Daily  . feeding supplement  237 mL Oral BID BM  . heparin  5,000 Units Subcutaneous Q8H  . lamiVUDine  25 mg Oral Daily  . metoprolol tartrate  12.5 mg Oral BID  . midodrine  10 mg Oral TID WC  . multivitamin  1 tablet Oral Daily  . paricalcitol  8 mcg Intravenous Q T,Th,Sa-HD  . ritonavir  100 mg Oral Q breakfast  . sodium chloride  25 mL/kg Intravenous Once  . sodium chloride  3 mL Intravenous Q12H  . stavudine  20 mg Oral Daily  . sulfamethoxazole-trimethoprim  2 tablet Oral QHS   Continuous Infusions:  PRN Meds:.acetaminophen, albumin human, diphenhydrAMINE, feeding supplement (NEPRO CARB STEADY), guaiFENesin-dextromethorphan, heparin, lidocaine, lidocaine-prilocaine, loperamide, ondansetron (ZOFRAN) IV, ondansetron, pentafluoroprop-tetrafluoroeth, DISCONTD: heparin Assessment/Plan:  Disposition Overall poor prognosis due to poorly controlled HIV and severe infections including endocarditis.[name] She will be discharged with IV Ancef and po Bactrim. She can be discharged home with [facility] OT[facility]RN/Aid since she has clinically improved.  # Ventricular tachycardia    Patient was noted to have had VT for 15 secs. She was asymptomatic with stable VVS.  Her EKG after the event showed T wave inversions  anteriorlateral leads which were presents on this admission and last admission in December of 2012. However, her T wave appears deeper than the one on admission. She has had several echo and TEE done for evaluation of endocarditis in the past and during this admission. Her EF was 40-45% with no obvious regional wall motion abnormality in 05/21/11. ( she has ? Small MV vegetation). In addition, her HGB was noted to be 7 today.  The etiology of her V tachy could be cardiac ischemia/ cardiomyopathy/anemia/electrolytes imbalance.   - CE x 3 - stat Mg and BMP>>>mg is 1.9 and K 4.2>>> will give her Mg oxide - cardiology consult - will contact nephrologist for blood transfusion with HD tonight.   #. Severe Sepsis , resolved  The etiologies of severe sepsis are likely HCAP vs PCP Vs HD catheter infection vs endocarditis.  - Blood culture>>>1/2 Staph aureus>>> Ancef with HD-  - for HCAP >>> Ancef  - for possible PCP>>>less likely to obtain sputum sample since she only has nonproductive cough.                                >>> Bactrim po at 5mg /kg/hour daily for PCP Tx. She will need the treatment for 21 days                                >>> ABG shows PO2 82 ans Aa gradient of 8>>>no need for steroids for now.  #Bacterremia and bacterial endocarditis  Staph aureus in 1/2 blood culture and two majors and two minors>>>confirmed  The source>>>left chest HD catheter  -HD catheter removed on 05/22/11. New HD catheter placed today  - Repeat blood culture x2 since the catheter is removed- negative to date.  - Ancef IV which HD  - will need 6 weeks tx, especially given her poorly controlled HIV.   # HCAP vs PCP PNA , improving  ESRD with HD patient is found to have B/L opacity with R>L on chest ray>>>HCAP  Poorly controlled HIV due to medical noncompliance>>>ca not rule out PCP  - crypto antigen>>>Negative  - Ancef, bactrim see above note  - symptomatic treatment for cough.   # ESRD with HD,per renal    # Diarrhea, resolved  - Negative C-diff PCR  - Probiotics  - imodium PRN  # Congestive cardiomyopathy  LV EF 40-45% Per TTE on 05/21/2011- possible small mitral valve vegetation.  Patient is not volume overload on admission. Caution IVF replacement for sepsis and septic shock  -Continue to monitor  # HIV infection  This is a chronic problem. She does have a poor compliance with her medications. Her last CD4 220 and VL [id number] on 02/19/11.  - will continue her home medications  - CD4 130 and VL [id number]  #Thrombocytopenia  Chronic stable. Likely due to end-stage renal disease.  # Anemia of chronic disease, baseline 8-9  Stable  - On Procrit  -Weekly Venofer  # Mild Hypertension  Baseline SBP ranges at 140's. Not on any medical treatment  .  # Malnutrition, likely due to renal disease  Albumin 1.5 on this admission  - Oral supplement  -Nutritionist consult  - albumin Tx given per renal  -Will not continue Megace since it increase risk of thrombosis especially patient is on Procrit.  # VTE: Heparin      LOS: 9 days   Gaines Cartmell 05/27/2011, 4:16 PM

## 2011-05-27 NOTE — Consult Note (Addendum)
Reason for Consult: NSVT  Requesting Physician: Teaching Service  HPI: This is a 56 y.o. female with a past medical history significant for HIV with ESRD on HD. She has been evaluated for chest pain in 2012 by Dr Doreatha Lew but I don't believe she has ever had a Myoview or cath. Echos have shown varying EF from 25-45%. She is admitted this time with Sepsis. Echo 5/01/11/11 showed an EF of 40-45% with a MV vegetation. We are asked to see her now after a run of WCT that lasted about a minute and was asymptomatic. She denies any history of arrythmia or palpitations. She is not currently complaining of chest pain.  PMHx:  Past Medical History  Diagnosis Date  . Human immunodeficiency virus (HIV) disease   . Hypertension   . ESRD (end stage renal disease)   . Dialysis patient     pt on dialysis since 2010.  [name] Clostridium difficile infection 04/04/10  . Bacteriuria, asymptomatic 04/04/10    Culture grew VRE sensitive to linesolid   . MGUS (monoclonal gammopathy of unknown significance)   . History of bacteremia     MSSA  . Renal insufficiency   . Anemia    Past Surgical History  Procedure Date  . Av fistula placement   . Insertion of dialysis catheter 12/30/2010    Procedure: INSERTION OF DIALYSIS CATHETER;  Surgeon: Elam Dutch, MD;  Location: [facility];  Service: Vascular;  Laterality: Left;  Insertion of left internal jugular dialysis catheter  . Vascular surgery   . Insertion of dialysis catheter 05/26/2011    Procedure: INSERTION OF DIALYSIS CATHETER;  Surgeon: Conrad Hartville, MD;  Location: [facility] OR;  Service: Vascular;  Laterality: N/A;  Insertion tunneled dialysis catheter in Right Internal Jugular with 23 cm catheter     FAMHx: Family History  Problem Relation Age of Onset  . Alcohol abuse      family h/o addiction/alcoholism  . Diabetes Cousin     first degree relatives  . Kidney disease Mother   . Kidney disease Maternal Uncle   . Cancer Sister     SOCHx:  reports that she  has never smoked. She has never used smokeless tobacco. She reports that she does not drink alcohol or use illicit drugs.  ALLERGIES: No Known Allergies  ROS: Pertinent items are noted in HPI.  HOME MEDICATIONS: Prescriptions prior to admission  Medication Sig Dispense Refill  . acetaminophen (TYLENOL) 650 MG CR tablet Take 650 mg by mouth every 8 (eight) hours as needed. For pain.      [name] aspirin 81 MG tablet Take 81 mg by mouth daily.        [name] b complex vitamins tablet Take 1 tablet by mouth daily.        [name] b complex-vitamin c-folic acid (NEPHRO-VITE) 0.8 MG TABS Take 0.8 mg by mouth daily.        . darbepoetin (ARANESP, ALBUMIN FREE,) 150 MCG/0.3ML SOLN Inject 150 mcg into the skin. Use weekly for Epogen 20000 units. Dialysis nurse to administer.       . darunavir (PREZISTA) 400 MG tablet Take 2 tablets (800 mg total) by mouth daily with breakfast.  60 tablet  1  . ENSURE (ENSURE) Take 237 mLs by mouth 3 (three) times daily between meals.        . famotidine (PEPCID) 20 MG tablet Take 20 mg by mouth daily.        . iron sucrose (VENOFER) 20 MG/ML injection  100 mg IV on Tuesday HD(HD RN to dispense)       . lamiVUDine (EPIVIR) 10 MG/ML solution Take 1 teaspoon by mouth every day.       . paricalcitol (ZEMPLAR) 5 MCG/ML injection 4 micrograms IV- dispensed by dialysis nurse.       . ritonavir (NORVIR) 100 MG TABS Take 100 mg by mouth daily with breakfast.       . Stavudine (ZERIT) 1 MG/ML SOLR Take 4 teaspoons(20 ml) by mouth every day.       . sulfamethoxazole-trimethoprim (BACTRIM DS) 800-160 MG per tablet Take 1 tablet by mouth daily. Take 1 tablet by mouth every Monday, Wednesday and Friday.        HOSPITAL MEDICATIONS: I have reviewed the patient's current medications.  VITALS: Blood pressure 111/71, pulse 73, temperature 97.5 F (36.4 C), temperature source Oral, resp. rate 18, height 5\' 9"  (1.753 m), weight 56.6 kg (124 lb 12.5 oz), SpO2 100.00%.  PHYSICAL EXAM: General  appearance: alert, cooperative, no distress and chronically ill appearing Neck: no carotid bruit, no JVD and supple, symmetrical, trachea midline Lungs: decreased breath sounds at the bases Heart: RRR with 2/6 sytolic murmur at LSB Abdomen: obese nontender Extremities: 1+ bilat edema Pulses: diminnished Skin: multiple lessions on her back Neurologic: Grossly normal  LABS: Results for orders placed during the hospital encounter of 05/18/11 (from the past 48 hour(s))  COMPREHENSIVE METABOLIC PANEL     Status: Abnormal   Collection Time   05/25/11  8:30 PM      Component Value Range Comment   Sodium 130 (*) 135 - 145 (mEq/L)    Potassium 4.5  3.5 - 5.1 (mEq/L)    Chloride 97  96 - 112 (mEq/L)    CO2 27  19 - 32 (mEq/L)    Glucose, Bld 68 (*) 70 - 99 (mg/dL)    BUN 27 (*) 6 - 23 (mg/dL)    Creatinine, Ser 4.82 (*) 0.50 - 1.10 (mg/dL)    Calcium 8.6  8.4 - 10.5 (mg/dL)    Total Protein 8.0  6.0 - 8.3 (g/dL)    Albumin 1.3 (*) 3.5 - 5.2 (g/dL)    AST 39 (*) 0 - 37 (U/L)    ALT <5  0 - 35 (U/L)    Alkaline Phosphatase 221 (*) 39 - 117 (U/L)    Total Bilirubin 0.2 (*) 0.3 - 1.2 (mg/dL)    GFR calc non Af Amer 9 (*) >90 (mL/min)    GFR calc Af Amer 11 (*) >90 (mL/min)   CBC     Status: Abnormal   Collection Time   05/25/11  8:30 PM      Component Value Range Comment   WBC 4.7  4.0 - 10.5 (K/uL)    RBC 2.77 (*) 3.87 - 5.11 (MIL/uL)    Hemoglobin 8.3 (*) 12.0 - 15.0 (g/dL)    HCT 25.8 (*) 36.0 - 46.0 (%)    MCV 93.1  78.0 - 100.0 (fL)    MCH 30.0  26.0 - 34.0 (pg)    MCHC 32.2  30.0 - 36.0 (g/dL)    RDW 20.7 (*) 11.5 - 15.5 (%)    Platelets 192  150 - 400 (K/uL)   RENAL FUNCTION PANEL     Status: Abnormal   Collection Time   05/26/11  5:00 AM      Component Value Range Comment   Sodium 129 (*) 135 - 145 (mEq/L)    Potassium 4.6  3.5 -  5.1 (mEq/L)    Chloride 97  96 - 112 (mEq/L)    CO2 26  19 - 32 (mEq/L)    Glucose, Bld 68 (*) 70 - 99 (mg/dL)    BUN 28 (*) 6 - 23 (mg/dL)     Creatinine, Ser 5.07 (*) 0.50 - 1.10 (mg/dL)    Calcium 8.6  8.4 - 10.5 (mg/dL)    Phosphorus 3.9  2.3 - 4.6 (mg/dL)    Albumin 1.2 (*) 3.5 - 5.2 (g/dL)    GFR calc non Af Amer 9 (*) >90 (mL/min)    GFR calc Af Amer 10 (*) >90 (mL/min)   CBC     Status: Abnormal   Collection Time   05/26/11  5:00 AM      Component Value Range Comment   WBC 4.0  4.0 - 10.5 (K/uL)    RBC 2.86 (*) 3.87 - 5.11 (MIL/uL)    Hemoglobin 8.5 (*) 12.0 - 15.0 (g/dL)    HCT 27.0 (*) 36.0 - 46.0 (%)    MCV 94.4  78.0 - 100.0 (fL)    MCH 29.7  26.0 - 34.0 (pg)    MCHC 31.5  30.0 - 36.0 (g/dL)    RDW 20.5 (*) 11.5 - 15.5 (%)    Platelets 204  150 - 400 (K/uL)   RENAL FUNCTION PANEL     Status: Abnormal   Collection Time   05/27/11  5:15 AM      Component Value Range Comment   Sodium 134 (*) 135 - 145 (mEq/L)    Potassium 3.7  3.5 - 5.1 (mEq/L) DELTA CHECK NOTED   Chloride 102  96 - 112 (mEq/L)    CO2 27  19 - 32 (mEq/L)    Glucose, Bld 87  70 - 99 (mg/dL)    BUN 11  6 - 23 (mg/dL) DELTA CHECK NOTED   Creatinine, Ser 2.74 (*) 0.50 - 1.10 (mg/dL) DELTA CHECK NOTED   Calcium 7.8 (*) 8.4 - 10.5 (mg/dL)    Phosphorus 2.7  2.3 - 4.6 (mg/dL)    Albumin 1.2 (*) 3.5 - 5.2 (g/dL)    GFR calc non Af Amer 19 (*) >90 (mL/min)    GFR calc Af Amer 21 (*) >90 (mL/min)   CBC     Status: Abnormal   Collection Time   05/27/11  5:15 AM      Component Value Range Comment   WBC 3.7 (*) 4.0 - 10.5 (K/uL)    RBC 2.33 (*) 3.87 - 5.11 (MIL/uL)    Hemoglobin 7.0 (*) 12.0 - 15.0 (g/dL)    HCT 22.1 (*) 36.0 - 46.0 (%)    MCV 94.8  78.0 - 100.0 (fL)    MCH 30.0  26.0 - 34.0 (pg)    MCHC 31.7  30.0 - 36.0 (g/dL)    RDW 21.1 (*) 11.5 - 15.5 (%)    Platelets 195  150 - 400 (K/uL)   CARDIAC PANEL(CRET KIN+CKTOT+MB+TROPI)     Status: Normal   Collection Time   05/27/11  3:11 PM      Component Value Range Comment   Total CK 42  7 - 177 (U/L)    CK, MB 2.7  0.3 - 4.0 (ng/mL)    Troponin I <0.30  <0.30 (ng/mL)    Relative Index  RELATIVE INDEX IS INVALID  0.0 - 2.5    MAGNESIUM     Status: Normal   Collection Time   05/27/11  3:11 PM  Component Value Range Comment   Magnesium 1.9  1.5 - 2.5 (mg/dL)   BASIC METABOLIC PANEL     Status: Abnormal   Collection Time   05/27/11  3:11 PM      Component Value Range Comment   Sodium 133 (*) 135 - 145 (mEq/L)    Potassium 4.2  3.5 - 5.1 (mEq/L)    Chloride 99  96 - 112 (mEq/L)    CO2 27  19 - 32 (mEq/L)    Glucose, Bld 97  70 - 99 (mg/dL)    BUN 14  6 - 23 (mg/dL)    Creatinine, Ser 3.28 (*) 0.50 - 1.10 (mg/dL)    Calcium 8.2 (*) 8.4 - 10.5 (mg/dL)    GFR calc non Af Amer 15 (*) >90 (mL/min)    GFR calc Af Amer 17 (*) >90 (mL/min)     IMAGING: Dg Chest Port 1 View  05/26/2011  *RADIOLOGY REPORT*  Clinical Data: TDC placement  PORTABLE CHEST - 1 VIEW  Comparison: 05/19/2011  Findings: Cardiomediastinal silhouette is stable.  There is a dual lumen right IJ catheter with tip in right atrium.  No diagnostic pneumothorax.  Bilateral perihilar airspace disease right greater than left again noted.  Surgical clips in the left axilla again noted.  IMPRESSION: Dual lumen right IJ catheter with tip in right atrium.  No diagnostic pneumothorax.  Again noted bilateral perihilar airspace disease right greater than left.  Original Report Authenticated By: Lahoma Crocker, M.D.   EKG- NSR with AS TWI old.   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 difficile colitis  ANEMIA OF CHRONIC DISEASE  THROMBOCYTOPENIA  Noncompliance  Protein calorie malnutrition  Cardiomyopathy EF 40-45% 2D this admission  Abnormal EKG, chronic AS TWI  NSVT (nonsustained ventricular tachycardia)   RECOMMENDATION: Will review telemetry with MD. The tachycardia in lead 2 looks like A-flutter with 2:1 conduction, in lead V1 its a more VT looking WCT. Recommend beta  blocker  If her B/P can tolerate it.erum K= and Mg++ appear to be stable.  Time Spent Directly with Patient: 35 minutes  KILROY,LUKE K 05/27/2011, 5:06 PM  Pt. Seen and examined. Agree with the NP/PA-C note as written. This is an unfortunate 56 yo female with HIV and ESRD on HD who presented with sepsis and was found to have a dialysis line infection, positive blood cultures for VRE and an echocardiogram which is concerning for possible mitral valve vegetation as well as thickening of the aortic valve with insufficiency. An echocardiogram was performed which demonstrated an EF of 40-45% which is new, however, she has had a suppressed LVEF in the past. There has never been an ischemia workup to our knowledge. She was seen once by Dr. Doreatha Lew. In addition, there was 30-35 beat run of WCT yesterday which is likely VT and several short runs this morning, which have a different morphology and may be an atrial tachycardia.   At this point, I agree with the initiation of b-blockade.  She will need an ischemia work-up as she is at risk for CAD from her HIV disease and ESRD, although, her cardiomyopathy is statistically more likely to be non-ischemic. B-blocker is also indicated for heart failure, as would an ACE-I or ARB if she can tolerate it with regards to bp. One issue may be bradycardia, which she appears to be having as well as  hypotension (she is on midodrine). I agree with empiric treatment for 6 weeks of antibiotics. I doubt a TEE will offer additional information that would alter her course of treatment.  I would keep her overnight and plan a lexiscan NST tomorrow. If low-risk and her NSVT has quieted down with the b-blocker, then she could more safely be discharged. Will follow with you. Thanks for the consult.  Pixie Casino, MD, [facility] Attending Cardiologist [facility]

## 2011-05-27 NOTE — Progress Notes (Signed)
[facility]  Daily Progress Note  Assessment/Planning: POD #1 s/p RIJ TDC    Uncomplicated TDC w/o complications  TDC in RA atrium  Available as needed  Subjective  - 1 Day Post-Op  No complaints  Objective Filed Vitals:   05/26/11 1900 05/26/11 1911 05/26/11 2100 05/27/11 0535  BP: 112/67 136/82 102/62 115/70  Pulse: 72 55 55 67  Temp:  97.1 F (36.2 C) 97.3 F (36.3 C) 98.2 F (36.8 C)  TempSrc:  Oral Oral Oral  Resp: 15 19 18 18   Height:      Weight:  125 lb 3.5 oz (56.8 kg)  124 lb 12.5 oz (56.6 kg)  SpO2:  98% 97% 97%    Intake/Output Summary (Last 24 hours) at 05/27/11 0732 Last data filed at 05/27/11 0200  Gross per 24 hour  Intake    580 ml  Output    691 ml  Net   -111 ml    PULM  CTAB, RIJ TDC without hematoma on chest or neck CV  RRR GI  soft, NTND  Laboratory CBC    Component Value Date/Time   WBC 3.7* 05/27/2011 0515   HGB 7.0* 05/27/2011 0515   HCT 22.1* 05/27/2011 0515   PLT 195 05/27/2011 0515    BMET    Component Value Date/Time   NA 134* 05/27/2011 0515   K 3.7 05/27/2011 0515   CL 102 05/27/2011 0515   CO2 27 05/27/2011 0515   GLUCOSE 87 05/27/2011 0515   BUN 11 05/27/2011 0515   CREATININE 2.74* 05/27/2011 0515   CREATININE 4.98* 02/19/2011 1048   CALCIUM 7.8* 05/27/2011 0515   CALCIUM 7.6* 09/14/2007 1321   GFRNONAA 19* 05/27/2011 0515   GFRAA 21* 05/27/2011 0515    Adele Barthel, MD [facility]: [phone] Pager: [phone]  05/27/2011, 7:32 AM

## 2011-05-27 NOTE — Progress Notes (Signed)
Patient had a of V-tach on the monitor. Patient was asymptomatic. No complaints and no signs or symptoms of distress or discomfort. MD notified. Orders given to do EKG and save strip for MD to follow up. Will continue to monitor patient for further changes in condition.

## 2011-05-27 NOTE — Progress Notes (Signed)
Subjective:  No complaints. Still coughing some. Hb is down to 7.0 today, from mid 8's.  Had a witnessed BM which was nonbloody per staff.  Objective Vital signs in last 24 hours: Filed Vitals:   05/26/11 1911 05/26/11 2100 05/27/11 0535 05/27/11 0946  BP: 136/82 102/62 115/70 114/70  Pulse: 55 55 67 68  Temp: 97.1 F (36.2 C) 97.3 F (36.3 C) 98.2 F (36.8 C) 97.7 F (36.5 C)  TempSrc: Oral Oral Oral Oral  Resp: 19 18 18 18   Height:      Weight: 56.8 kg (125 lb 3.5 oz)  56.6 kg (124 lb 12.5 oz)   SpO2: 98% 97% 97% 100%   Weight change: 0.1 kg (3.5 oz)  Intake/Output Summary (Last 24 hours) at 05/27/11 1056 Last data filed at 05/27/11 0900  Gross per 24 hour  Intake    700 ml  Output    691 ml  Net      9 ml   Labs: Basic Metabolic Panel:  Lab [id number] 05/26/11 0500 05/25/11 2030 05/24/11 0530  NA 134* 129* 130* --  K 3.7 4.6 4.5 --  CL 102 97 97 --  CO2 27 26 27  --  GLUCOSE 87 68* 68* --  BUN 11 28* 27* --  CREATININE 2.74* 5.07* 4.82* --  CALCIUM 7.8* 8.6 8.6 --  ALB -- -- -- --  PHOS 2.7 3.9 -- 2.2*   Liver Function Tests:  Lab 05/27/11 0515 05/26/11 0500 05/25/11 2030 05/23/11 0550 05/22/11 0900  AST -- -- 39* 40* 42*  ALT -- -- 5 10 13   ALKPHOS -- -- 221* 276* 309*  BILITOT -- -- 0.2* 0.3 0.3  PROT -- -- 8.0 7.8 8.0  ALBUMIN 1.2* 1.2* 1.3* -- --   CBC:  Lab 05/27/11 0515 05/26/11 0500 05/25/11 2030 05/24/11 0530  WBC 3.7* 4.0 4.7 --  NEUTROABS -- -- -- --  HGB 7.0* 8.5* 8.3* --  HCT 22.1* 27.0* 25.8* --  MCV 94.8 94.4 93.1 94.4  PLT 195 204 192 --   Physical Exam: General: Alert ,NAD Heart: RRR, 2/6 sem , no rub Lungs: sl decr. bs at bases , left sided faint rales Abdomen: soft, nontender Extremities: Dialysis Access:  Bipedal edema 1+, with muscle wasting, new L IJ tunneled HD cath  [facility], 56 kg EDW. EPO 10,4000. Zemplar 8ug. No IV Fe (d/c'd in April). L IJ   Problem/Plan: 1. Sepsis 1/4 +MSSA and CNSS  bacteremia/MV possible vegetation- course c/w sepsis and possible endocarditis, w tunnelled HD cath as likely source of infection. Left IJ HD cath removed 5/16 and new R IJ placed yesterday, 5/20. Also has PNA.  IV antibiotics changed to Ancef.  2. ESRD / TTS at [place]- had HD yesterday, do HD again today to get back on schedule.  3. Chronic hypotension/Volume -bp 123/71,on Midodrine due to chronic hypotension issues. UF 2-3 kg with HD today as tolerated (+ leg edema). She is at her outpt dry wt but probably has lost weight in hospital.  4. Anemia - Hb 7.0 today- will recheck at HD today and transfuse as needed.   Outpatient Epogen was 10,400 U per HD and also was getting weekly Venofer. Aranesp 200 mcg here, and weekly IV Fe; recheck in am. 5. Metabolic bone disease - Ca 8.6, P 3.5 was 2,2 and prior1.55/14 on Zemplar 8 mcg per HD. Will continue. HD records show phoslo 1ac and tums 3ac with meals. Binders on hold  for now, phos low 2-3 range 6. Nutrition - Alb 1.2, on Megace as outpatient.- diet liberalized to regular - agree - needs foods easy to chew 7. HIV/AIDS - on antiviral meds + bactrim followed by ID. Viral load extremely high (1.5 million)  Kelly Splinter  MD [place] pgr    [phone] cell 05/27/2011, 12:12 PM

## 2011-05-28 ENCOUNTER — Other Ambulatory Visit: Payer: Self-pay

## 2011-05-28 LAB — TYPE AND SCREEN
ABO/RH(D): O POS
Antibody Screen: NEGATIVE
Unit division: 0
Unit division: 0

## 2011-05-28 LAB — CBC
HCT: 29.1 % — ABNORMAL LOW (ref 36.0–46.0)
Hemoglobin: 9.3 g/dL — ABNORMAL LOW (ref 12.0–15.0)
MCH: 29.7 pg (ref 26.0–34.0)
MCHC: 32 g/dL (ref 30.0–36.0)
MCV: 93 fL (ref 78.0–100.0)
Platelets: 182 10*3/uL (ref 150–400)
RBC: 3.13 MIL/uL — ABNORMAL LOW (ref 3.87–5.11)
RDW: 20.7 % — ABNORMAL HIGH (ref 11.5–15.5)
WBC: 3.9 10*3/uL — ABNORMAL LOW (ref 4.0–10.5)

## 2011-05-28 LAB — CARDIAC PANEL(CRET KIN+CKTOT+MB+TROPI)
CK, MB: 2.3 ng/mL (ref 0.3–4.0)
CK, MB: 2.2 ng/mL (ref 0.3–4.0)
Relative Index: INVALID (ref 0.0–2.5)
Relative Index: INVALID (ref 0.0–2.5)
Total CK: 41 U/L (ref 7–177)
Total CK: 38 U/L (ref 7–177)
Troponin I: <0.3 ng/mL (ref <0.30)
Troponin I: <0.3 ng/mL (ref <0.30)

## 2011-05-28 LAB — GLUCOSE, CAPILLARY: Glucose-Capillary: 70 mg/dL (ref 70–99)

## 2011-05-28 LAB — OCCULT BLOOD X 1 CARD TO LAB, STOOL: Fecal Occult Bld: NEGATIVE

## 2011-05-28 NOTE — Progress Notes (Addendum)
Subjective:  Feels better, stronger.  Got 2 units PRBC's yesterday.   Objective Vital signs in last 24 hours: Filed Vitals:   05/27/11 2256 05/27/11 2300 05/28/11 0524 05/28/11 0957  BP: 102/64 125/74 142/87 114/73  Pulse: 52 58 58 60  Temp:  98 F (36.7 C) 98.8 F (37.1 C)   TempSrc:  Oral Oral   Resp: 18 18 20    Height:      Weight:   54.341 kg (119 lb 12.8 oz)   SpO2:  100% 97%    Weight change: 0.5 kg (1 lb 1.6 oz)  Intake/Output Summary (Last 24 hours) at 05/28/11 1046 Last data filed at 05/28/11 0844  Gross per 24 hour  Intake   1060 ml  Output   2936 ml  Net  -1876 ml   Labs: Basic Metabolic Panel:  Lab [id number] 05/27/11 0515 05/26/11 0500 05/24/11 0530  NA 133* 134* 129* --  K 4.2 3.7 4.6 --  CL 99 102 97 --  CO2 27 27 26  --  GLUCOSE 97 87 68* --  BUN 14 11 28* --  CREATININE 3.28* 2.74* 5.07* --  CALCIUM 8.2* 7.8* 8.6 --  ALB -- -- -- --  PHOS -- 2.7 3.9 2.2*   Liver Function Tests:  Lab 05/27/11 0515 05/26/11 0500 05/25/11 2030 05/23/11 0550 05/22/11 0900  AST -- -- 39* 40* 42*  ALT -- -- 5 10 13   ALKPHOS -- -- 221* 276* 309*  BILITOT -- -- 0.2* 0.3 0.3  PROT -- -- 8.0 7.8 8.0  ALBUMIN 1.2* 1.2* 1.3* -- --   CBC:  Lab 05/28/11 0819 05/27/11 0515 05/26/11 0500 05/25/11 2030 05/24/11 0530  WBC 3.9* 3.7* 4.0 -- --  NEUTROABS -- -- -- -- --  HGB 9.3* 7.0* 8.5* -- --  HCT 29.1* 22.1* 27.0* -- --  MCV 93.0 94.8 94.4 93.1 94.4  PLT 182 195 204 -- --   Physical Exam: General: Alert ,NAD Heart: RRR, 2/6 sem , no rub Lungs: sl decr. bs at bases , left sided faint rales Abdomen: soft, nontender Extremities: Dialysis Access:  Bipedal edema 1+, with muscle wasting, new L IJ tunneled HD cath  [facility], 4 hrs, 56 kg EDW. EPO 10,4000. Zemplar 8ug. No IV Fe (d/c'd in April). L IJ   Problem/Plan: 1. Sepsis 1/4 +MSSA and CNSS bacteremia/MV possible vegetation- course c/w sepsis and possible endocarditis, w tunnelled HD cath as  likely source of infection. Left IJ HD cath removed 5/16 and new R IJ placed yesterday, 5/20. Also has PNA.  IV antibiotics changed to Ancef.  2. ESRD / TTS at [facility]- HD tomorrow if still here, TTS schedule.   3. Chronic hypotension/Volume -bp 123/71,on Midodrine due to chronic hypotension issues. UF 2-3 kg with HD today as tolerated (+ leg edema). She is at her outpt dry wt but probably has lost weight in hospital.  4. Anemia - s/p 2u PRBC's yest 5/21 for Hb 7   Outpatient Epogen was 10,400 U per HD and also was getting weekly Venofer. Aranesp 200 mcg here, and weekly IV Fe. Recheck Hb 5. Metabolic bone disease - Ca 8.6, P 3.5 was 2,2 and prior1.55/14 on Zemplar 8 mcg per HD. Will continue. HD records show phoslo 1ac and tums 3ac with meals. Binders on hold for now, phos low 2-3 range 6. Nutrition - Alb 1.2, on Megace as outpatient.- diet liberalized to regular - agree - needs foods easy to chew 7. HIV/AIDS -  on antiviral meds + bactrim followed by ID. Viral load extremely high (1.5 million) 8. NSVT- cardiology recommended beta-blocker.  With her low BP's, would recommend only low-dose beta-blocker therapy, if any.  9. Disposition- pt says she might be going home today. OK for discharge from renal standpoint.   Kelly Splinter  MD [employer] [phone] pgr    [phone] cell 05/28/2011, 10:46 AM

## 2011-05-28 NOTE — Progress Notes (Signed)
Patient was noted to have a short run of 8 beats asymptomatic V. tach this morning.   - Cardiologist has reviewed her monitor strips.   -She was started on low dose beta blocker per cardiology recommendation yesterday - She will have NM cardiac study in am - will hold her discharge. Renal notified.

## 2011-05-28 NOTE — Progress Notes (Signed)
Subjective:  Patient feels better.  She states that she wants to go home. She received 2 units PRBCs last night with HD and tolerated well.  Her hemoglobin is 9.3 today. No arrhythmias noted since her V. tach yesterday morning.  No acute event over night. Objective: Vital signs in last 24 hours: Filed Vitals:   05/27/11 2256 05/27/11 2300 05/28/11 0524 05/28/11 0957  BP: 102/64 125/74 142/87 114/73  Pulse: 52 58 58 60  Temp:  98 F (36.7 C) 98.8 F (37.1 C)   TempSrc:  Oral Oral   Resp: 18 18 20    Height:      Weight:   119 lb 12.8 oz (54.341 kg)   SpO2:  100% 97%    Weight change: 1 lb 1.6 oz (0.5 kg)  Intake/Output Summary (Last 24 hours) at 05/28/11 1354 Last data filed at 05/28/11 0844  Gross per 24 hour  Intake    940 ml  Output   2936 ml  Net  -1996 ml   General: NAD. Cathectic and chronic ill appearing  Skin: No petechiae is noted on MM. No splinter hemorrhage, Janeway lesions or Osler nodes noted  Scattered skin abrasions on her back. Right FA a healing ulcer noted  Lungs: CTA B/L, left chest HD catheter.  Heart: RRR, S1, S2, 2-3 systolic murmur noted.  Abd: Soft, tenderness to palpation suprapubic area. BS x 4  MSK: Mild tenderness to palpation on bilateral plantar side of the feet. Ext: PPP+1, no edema.   Lab Results: Basic Metabolic Panel:  Lab [id number] 05/27/11 0515 05/26/11 0500  [name] 133* 134* --  K 4.2 3.7 --  CL 99 102 --  CO2 27 27 --  GLUCOSE 97 87 --  BUN 14 11 --  CREATININE 3.28* 2.74* --  CALCIUM 8.2* 7.8* --  MG 1.9 -- --  PHOS -- 2.7 3.9   Liver Function Tests:  Lab 05/27/11 0515 05/26/11 0500 05/25/11 2030 05/23/11 0550  AST -- -- 39* 40*  ALT -- -- <5 10  ALKPHOS -- -- 221* 276*  BILITOT -- -- 0.2* 0.3  PROT -- -- 8.0 7.8  ALBUMIN 1.2* 1.2* -- --   CBC:  Lab 05/28/11 0819 05/27/11 0515  WBC 3.9* 3.7*  NEUTROABS -- --  HGB 9.3* 7.0*  HCT 29.1* 22.1*  MCV 93.0 94.8  PLT 182 195   Cardiac Enzymes:  Lab 05/28/11 0645  05/27/11 2326 05/27/11 1511  CKTOTAL 38 41 42  CKMB 2.2 2.3 2.7  CKMBINDEX -- -- --  TROPONINI <0.30 <0.30 <0.30   CBG:  Lab 05/27/11 2322  GLUCAP 70   Micro Results: Recent Results (from the past 240 hour(s))  URINE CULTURE     Status: Normal   Collection Time   05/18/11  3:42 PM      Component Value Range Status Comment   Specimen Description URINE, CLEAN CATCH   Final    Special Requests NONE   Final    Culture  Setup Time [id number]   Final    Colony Count NO GROWTH   Final    Culture NO GROWTH   Final    Report Status 05/20/2011 FINAL   Final   CULTURE, BLOOD (ROUTINE X 2)     Status: Normal   Collection Time   05/18/11  5:08 PM      Component Value Range Status Comment   Specimen Description BLOOD LEFT ARM   Final    Special Requests BOTTLES DRAWN AEROBIC AND  ANAEROBIC 10CC   Final    Culture  Setup Time [id number]   Final    Culture NO GROWTH 5 DAYS   Final    Report Status 05/25/2011 FINAL   Final   CULTURE, BLOOD (ROUTINE X 2)     Status: Normal   Collection Time   05/18/11  5:23 PM      Component Value Range Status Comment   Specimen Description BLOOD RIGHT HAND   Final    Special Requests BOTTLES DRAWN AEROBIC AND ANAEROBIC 10CC   Final    Culture  Setup Time [id number]   Final    Culture NO GROWTH 5 DAYS   Final    Report Status 05/25/2011 FINAL   Final   MRSA PCR SCREENING     Status: Normal   Collection Time   05/18/11  5:48 PM      Component Value Range Status Comment   MRSA by PCR NEGATIVE  NEGATIVE  Final   CLOSTRIDIUM DIFFICILE BY PCR     Status: Normal   Collection Time   05/20/11  5:29 AM      Component Value Range Status Comment   C difficile by pcr NEGATIVE  NEGATIVE  Final   CATH TIP CULTURE     Status: Normal   Collection Time   05/22/11  6:28 PM      Component Value Range Status Comment   Specimen Description CATH TIP CHEST LEFT   Final    Special Requests NONE   Final    Culture NO GROWTH 2 DAYS   Final    Report Status 05/25/2011  FINAL   Final   CULTURE, EXPECTORATED SPUTUM-ASSESSMENT     Status: Normal   Collection Time   05/23/11  8:10 AM      Component Value Range Status Comment   Specimen Description SPUTUM   Final    Special Requests EXPECT. SPU   Final    Sputum evaluation     Final    Value: THIS SPECIMEN IS ACCEPTABLE. RESPIRATORY CULTURE REPORT TO FOLLOW.   Report Status 05/23/2011 FINAL   Final   CULTURE, RESPIRATORY     Status: Normal   Collection Time   05/23/11  8:10 AM      Component Value Range Status Comment   Specimen Description SPUTUM   Final    Special Requests NONE   Final    Gram Stain     Final    Value: ABUNDANT WBC PRESENT,BOTH PMN AND MONONUCLEAR     FEW SQUAMOUS EPITHELIAL CELLS PRESENT     RARE GRAM POSITIVE COCCI IN PAIRS   Culture FEW CANDIDA ALBICANS   Final    Report Status 05/25/2011 FINAL   Final   CULTURE, BLOOD (ROUTINE X 2)     Status: Normal (Preliminary result)   Collection Time   05/23/11  2:35 PM      Component Value Range Status Comment   Specimen Description BLOOD ARM RIGHT   Final    Special Requests BOTTLES DRAWN AEROBIC AND ANAEROBIC 10CC   Final    Culture  Setup Time [id number]   Final    Culture     Final    Value:        BLOOD CULTURE RECEIVED NO GROWTH TO DATE CULTURE WILL BE HELD FOR 5 DAYS BEFORE ISSUING A FINAL NEGATIVE REPORT   Report Status PENDING   Incomplete   CULTURE, BLOOD (ROUTINE X 2)     Status: Normal (  Preliminary result)   Collection Time   05/23/11  2:50 PM      Component Value Range Status Comment   Specimen Description BLOOD RIGHT HAND   Final    Special Requests BOTTLES DRAWN AEROBIC AND ANAEROBIC 10CC   Final    Culture  Setup Time [id number]   Final    Culture     Final    Value:        BLOOD CULTURE RECEIVED NO GROWTH TO DATE CULTURE WILL BE HELD FOR 5 DAYS BEFORE ISSUING A FINAL NEGATIVE REPORT   Report Status PENDING   Incomplete   CLOSTRIDIUM DIFFICILE BY PCR     Status: Normal   Collection Time   05/23/11  4:16 PM       Component Value Range Status Comment   C difficile by pcr NEGATIVE  NEGATIVE  Final    Studies/Results: No results found. Medications: I have reviewed the patient's current medications. Scheduled Meds:    . acidophilus  1 capsule Oral Daily  . antiseptic oral rinse  15 mL Mouth Rinse BID  . aspirin  81 mg Oral Daily  .  ceFAZolin (ANCEF) IV  2 g Intravenous Q T,Th,Sa-HD  . darbepoetin (ARANESP) injection - DIALYSIS  200 mcg Intravenous Q Thu-HD  . darunavir  800 mg Oral Q breakfast  . famotidine  20 mg Oral Daily  . feeding supplement  237 mL Oral BID BM  . heparin  5,000 Units Subcutaneous Q8H  . lamiVUDine  25 mg Oral Daily  . magnesium oxide  400 mg Oral BID  . metoprolol tartrate  12.5 mg Oral BID  . midodrine  10 mg Oral TID WC  . multivitamin  1 tablet Oral Daily  . paricalcitol  8 mcg Intravenous Q T,Th,Sa-HD  . ritonavir  100 mg Oral Q breakfast  . sodium chloride  25 mL/kg Intravenous Once  . sodium chloride  3 mL Intravenous Q12H  . stavudine  20 mg Oral Daily  . sulfamethoxazole-trimethoprim  2 tablet Oral QHS  . DISCONTD:  ceFAZolin (ANCEF) IV  2 g Intravenous Q T,Th,Sat-1800   Continuous Infusions:  PRN Meds:.acetaminophen, albumin human, diphenhydrAMINE, feeding supplement (NEPRO CARB STEADY), guaiFENesin-dextromethorphan, heparin, lidocaine, lidocaine-prilocaine, loperamide, ondansetron (ZOFRAN) IV, ondansetron, pentafluoroprop-tetrafluoroeth Assessment/Plan:  Disposition Overall poor prognosis due to poorly controlled HIV and severe infections including endocarditis.[name] She will be discharged with IV Ancef and po Bactrim. She can be discharged home with [facility] OT/RN/Aid since she has clinically improved. Awaiting for clearance from cardiology. I have talked to Kerin Ransom PA who will let Cardiologist know her pending discharge.  # Ventricular tachycardia      Asymptomatic VT for 15 secs on 05/27/11. CE x 3 negative. EKG mild T wave changes.  LVEF 40-45% with no obvious  regional wall motion on 05/21/11. ( she has ? Small MV vegetation). K 4.2 and Mg 1.9 both ok. HGB 7 yesterday with Negative FOBT likely due to ESRD and/or HD catheter placement. HGB 9.3 after 2 units PRBCs.  The etiology of her V tachy likely cardiomyopathy/anemia  - low dose BB started per cardiology PA recs - awaiting for cardiology clearance before discharge.  #. Severe Sepsis , resolved  The etiologies of severe sepsis are likely HCAP vs PCP Vs HD catheter infection vs endocarditis.  - Blood culture>>>1/2 Staph aureus>>> Ancef with HD-  - for HCAP >>> Ancef  - for possible PCP>>>less likely to obtain sputum sample since she only has nonproductive cough.  >>>  Bactrim po at 5mg /kg/hour daily for PCP Tx. She will need the treatment for 21 days  >>> ABG shows PO2 82 ans Aa gradient of 8>>>no need for steroids for now.  #Bacterremia and bacterial endocarditis  Staph aureus in 1/2 blood culture and two majors and two minors>>>confirmed  The source>>>left chest HD catheter  -HD catheter removed on 05/22/11. New HD catheter placed today  - Repeat blood culture x2 since the catheter is removed- negative to date.  - Ancef IV which HD  - will need 6 weeks tx, especially given her poorly controlled HIV.  # HCAP vs PCP PNA , improving  ESRD with HD patient is found to have B/L opacity with R>L on chest ray>>>HCAP  Poorly controlled HIV due to medical noncompliance>>>ca not rule out PCP  - crypto antigen>>>Negative  - Ancef, bactrim see above note  - symptomatic treatment for cough.  # ESRD with HD,per renal  # Diarrhea, resolved  - Negative C-diff PCR  - Probiotics  - imodium PRN  # Congestive cardiomyopathy  LV EF 40-45% Per TTE on 05/21/2011- possible small mitral valve vegetation.  Patient is not volume overload on admission. Caution IVF replacement for sepsis and septic shock  -Continue to monitor  # HIV infection  This is a chronic problem. She does have a poor compliance with her  medications. Her last CD4 220 and VL [id number] on 02/19/11.  - will continue her home medications  - CD4 130 and VL [id number]  #Thrombocytopenia  Chronic stable. Likely due to end-stage renal disease.  # Anemia of chronic disease, baseline 8-9  Stable  - On Procrit  -Weekly Venofer  # Mild Hypertension  Baseline SBP ranges at 140's. Not on any medical treatment  .  # Malnutrition, likely due to renal disease  Albumin 1.5 on this admission  - Oral supplement  -Nutritionist consult  - albumin Tx given per renal  -Will not continue Megace since it increase risk of thrombosis especially patient is on Procrit.  # VTE: Heparin        LOS: 10 days   [name] 05/28/2011, 1:54 PM

## 2011-05-28 NOTE — Progress Notes (Signed)
Covering Clinical Education officer, museum (CSW) was contacted by Resident following that pt could potentially dc home today and would be in need of transportation resources. CSW visited pt room and was informed by pt that she has a daughter who is typically able to take her to her appointments. CSW proceeded to contact Medicaid transportation and confirmed that pt is setup with transportation services through [facility] for her doctor appointments. CSW provided pt with a form that included the [facility] Transportation line [phone] in which pt needs to call 3 days prior to her appointment to schedule her transportation needs. Pt was appreciative of resource and had no further questions. CSW  informed MD of this information and CSW is signing off.  Hunt Oris, MSW, [place]

## 2011-05-29 ENCOUNTER — Inpatient Hospital Stay (HOSPITAL_COMMUNITY): Payer: Medicaid Other

## 2011-05-29 ENCOUNTER — Encounter (HOSPITAL_COMMUNITY): Payer: Self-pay

## 2011-05-29 LAB — CBC
HCT: 29.8 % — ABNORMAL LOW (ref 36.0–46.0)
Hemoglobin: 9.6 g/dL — ABNORMAL LOW (ref 12.0–15.0)
MCH: 30.3 pg (ref 26.0–34.0)
MCHC: 32.2 g/dL (ref 30.0–36.0)
MCV: 94 fL (ref 78.0–100.0)
Platelets: 199 10*3/uL (ref 150–400)
RBC: 3.17 MIL/uL — ABNORMAL LOW (ref 3.87–5.11)
RDW: 20.8 % — ABNORMAL HIGH (ref 11.5–15.5)
WBC: 3.3 10*3/uL — ABNORMAL LOW (ref 4.0–10.5)

## 2011-05-29 LAB — RENAL FUNCTION PANEL
Albumin: 1.4 g/dL — ABNORMAL LOW (ref 3.5–5.2)
BUN: 14 mg/dL (ref 6–23)
CO2: 29 mEq/L (ref 19–32)
Calcium: 8.5 mg/dL (ref 8.4–10.5)
Chloride: 99 mEq/L (ref 96–112)
Creatinine, Ser: 3.44 mg/dL — ABNORMAL HIGH (ref 0.50–1.10)
GFR calc Af Amer: 16 mL/min — ABNORMAL LOW (ref >90)
GFR calc non Af Amer: 14 mL/min — ABNORMAL LOW (ref >90)
Glucose, Bld: 67 mg/dL — ABNORMAL LOW (ref 70–99)
Phosphorus: 3.2 mg/dL (ref 2.3–4.6)
Potassium: 4.7 mEq/L (ref 3.5–5.1)
Sodium: 134 mEq/L — ABNORMAL LOW (ref 135–145)

## 2011-05-29 LAB — CULTURE, BLOOD (ROUTINE X 2)
Culture  Setup Time: 201305172217
Culture  Setup Time: 201305172217
Culture: NO GROWTH
Culture: NO GROWTH

## 2011-05-29 MED ORDER — DARBEPOETIN ALFA-POLYSORBATE 200 MCG/0.4ML IJ SOLN
INTRAMUSCULAR | Status: AC
  Administered 2011-05-29: 200 ug via INTRAVENOUS
  Filled 2011-05-29: qty 0.4

## 2011-05-29 MED ORDER — TECHNETIUM TC 99M TETROFOSMIN IV KIT
10.0000 | PACK | Freq: Once | INTRAVENOUS | Status: AC | PRN
  Administered 2011-05-29: 10 via INTRAVENOUS

## 2011-05-29 MED ORDER — HEPARIN SODIUM (PORCINE) 1000 UNIT/ML DIALYSIS
2000.0000 [IU] | INTRAMUSCULAR | Status: DC | PRN
  Filled 2011-05-29: qty 2

## 2011-05-29 MED ORDER — TECHNETIUM TC 99M TETROFOSMIN IV KIT
30.0000 | PACK | Freq: Once | INTRAVENOUS | Status: AC | PRN
  Administered 2011-05-29: 30 via INTRAVENOUS

## 2011-05-29 MED ORDER — REGADENOSON 0.4 MG/5ML IV SOLN
0.4000 mg | Freq: Once | INTRAVENOUS | Status: AC
  Administered 2011-05-29: 0.4 mg via INTRAVENOUS

## 2011-05-29 MED ORDER — PARICALCITOL 5 MCG/ML IV SOLN
INTRAVENOUS | Status: AC
  Administered 2011-05-29: 8 ug via INTRAVENOUS
  Filled 2011-05-29: qty 2

## 2011-05-29 MED ORDER — MIDODRINE HCL 5 MG PO TABS
ORAL_TABLET | ORAL | Status: AC
  Administered 2011-05-29: 10 mg via ORAL
  Filled 2011-05-29: qty 2

## 2011-05-29 NOTE — Progress Notes (Signed)
Patient returned to unit from Radiology/Nuclear Medicine and is now currently in hemodialysis________D. Owens Shark RN

## 2011-05-29 NOTE — Progress Notes (Signed)
Subjective: Patient feels better. She will have NM Myoview and HD today.     No arrhythmias noted overnight. No acute event over night.  Objective: Vital signs in last 24 hours: Filed Vitals:   05/28/11 1430 05/28/11 2113 05/29/11 0611 05/29/11 0820  BP: 110/64 141/65 122/59 115/54  Pulse: 67 43 55 52  Temp: 98.7 F (37.1 C) 98.1 F (36.7 C) 98.5 F (36.9 C) 98.2 F (36.8 C)  TempSrc: Oral Oral Oral Oral  Resp: 20 20 18 18   Height:      Weight:   120 lb 6.4 oz (54.613 kg)   SpO2: 98% 94% 92% 100%   Weight change: -4 lb 13.1 oz (-2.187 kg)  Intake/Output Summary (Last 24 hours) at 05/29/11 1206 Last data filed at 05/29/11 1130  Gross per 24 hour  Intake    123 ml  Output      0 ml  Net    123 ml   General: NAD. Cathectic and chronic ill appearing  Skin: No petechiae is noted on MM. No splinter hemorrhage, Janeway lesions or Osler nodes noted  Scattered skin abrasions on her back. Right FA a healing ulcer noted  Lungs: CTA B/L, left chest HD catheter.  Heart: RRR, S1, S2, 2-3 systolic murmur noted.  Abd: Soft, tenderness to palpation suprapubic area. BS x 4  MSK: Mild tenderness to palpation on bilateral plantar side of the feet. Ext: PPP+1, no edema.   Lab Results: Basic Metabolic Panel:  Lab [id number] 05/27/11 0515 05/26/11 0500  [name] 133* 134* --  K 4.2 3.7 --  CL 99 102 --  CO2 27 27 --  GLUCOSE 97 87 --  BUN 14 11 --  CREATININE 3.28* 2.74* --  CALCIUM 8.2* 7.8* --  MG 1.9 -- --  PHOS -- 2.7 3.9   Liver Function Tests:  Lab 05/27/11 0515 05/26/11 0500 05/25/11 2030 05/23/11 0550  AST -- -- 39* 40*  ALT -- -- <5 10  ALKPHOS -- -- 221* 276*  BILITOT -- -- 0.2* 0.3  PROT -- -- 8.0 7.8  ALBUMIN 1.2* 1.2* -- --   CBC:  Lab 05/28/11 0819 05/27/11 0515  WBC 3.9* 3.7*  NEUTROABS -- --  HGB 9.3* 7.0*  HCT 29.1* 22.1*  MCV 93.0 94.8  PLT 182 195   Cardiac Enzymes:  Lab 05/28/11 0645 05/27/11 2326 05/27/11 1511  CKTOTAL 38 41 42  CKMB 2.2 2.3  2.7  CKMBINDEX -- -- --  TROPONINI <0.30 <0.30 <0.30   CBG:  Lab 05/27/11 2322  GLUCAP 70    Micro Results: Recent Results (from the past 240 hour(s))  CLOSTRIDIUM DIFFICILE BY PCR     Status: Normal   Collection Time   05/20/11  5:29 AM      Component Value Range Status Comment   C difficile by pcr NEGATIVE  NEGATIVE  Final   CATH TIP CULTURE     Status: Normal   Collection Time   05/22/11  6:28 PM      Component Value Range Status Comment   Specimen Description CATH TIP CHEST LEFT   Final    Special Requests NONE   Final    Culture NO GROWTH 2 DAYS   Final    Report Status 05/25/2011 FINAL   Final   CULTURE, EXPECTORATED SPUTUM-ASSESSMENT     Status: Normal   Collection Time   05/23/11  8:10 AM      Component Value Range Status Comment   Specimen Description  SPUTUM   Final    Special Requests EXPECT. SPU   Final    Sputum evaluation     Final    Value: THIS SPECIMEN IS ACCEPTABLE. RESPIRATORY CULTURE REPORT TO FOLLOW.   Report Status 05/23/2011 FINAL   Final   CULTURE, RESPIRATORY     Status: Normal   Collection Time   05/23/11  8:10 AM      Component Value Range Status Comment   Specimen Description SPUTUM   Final    Special Requests NONE   Final    Gram Stain     Final    Value: ABUNDANT WBC PRESENT,BOTH PMN AND MONONUCLEAR     FEW SQUAMOUS EPITHELIAL CELLS PRESENT     RARE GRAM POSITIVE COCCI IN PAIRS   Culture FEW CANDIDA ALBICANS   Final    Report Status 05/25/2011 FINAL   Final   CULTURE, BLOOD (ROUTINE X 2)     Status: Normal   Collection Time   05/23/11  2:35 PM      Component Value Range Status Comment   Specimen Description BLOOD ARM RIGHT   Final    Special Requests BOTTLES DRAWN AEROBIC AND ANAEROBIC 10CC   Final    Culture  Setup Time [id number]   Final    Culture NO GROWTH 5 DAYS   Final    Report Status 05/29/2011 FINAL   Final   CULTURE, BLOOD (ROUTINE X 2)     Status: Normal   Collection Time   05/23/11  2:50 PM      Component Value Range  Status Comment   Specimen Description BLOOD RIGHT HAND   Final    Special Requests BOTTLES DRAWN AEROBIC AND ANAEROBIC 10CC   Final    Culture  Setup Time [id number]   Final    Culture NO GROWTH 5 DAYS   Final    Report Status 05/29/2011 FINAL   Final   CLOSTRIDIUM DIFFICILE BY PCR     Status: Normal   Collection Time   05/23/11  4:16 PM      Component Value Range Status Comment   C difficile by pcr NEGATIVE  NEGATIVE  Final    Studies/Results: No results found. Medications: I have reviewed the patient's current medications. Scheduled Meds:   . acidophilus  1 capsule Oral Daily  . antiseptic oral rinse  15 mL Mouth Rinse BID  . aspirin  81 mg Oral Daily  .  ceFAZolin (ANCEF) IV  2 g Intravenous Q T,Th,Sa-HD  . darbepoetin (ARANESP) injection - DIALYSIS  200 mcg Intravenous Q Thu-HD  . darunavir  800 mg Oral Q breakfast  . famotidine  20 mg Oral Daily  . feeding supplement  237 mL Oral BID BM  . heparin  5,000 Units Subcutaneous Q8H  . lamiVUDine  25 mg Oral Daily  . magnesium oxide  400 mg Oral BID  . midodrine  10 mg Oral TID WC  . multivitamin  1 tablet Oral Daily  . paricalcitol  8 mcg Intravenous Q T,Th,Sa-HD  . regadenoson  0.4 mg Intravenous Once  . ritonavir  100 mg Oral Q breakfast  . sodium chloride  25 mL/kg Intravenous Once  . sodium chloride  3 mL Intravenous Q12H  . stavudine  20 mg Oral Daily  . sulfamethoxazole-trimethoprim  2 tablet Oral QHS  . DISCONTD: metoprolol tartrate  12.5 mg Oral BID   Continuous Infusions:  PRN Meds:.acetaminophen, albumin human, diphenhydrAMINE, feeding supplement (NEPRO CARB STEADY), guaiFENesin-dextromethorphan, heparin, lidocaine,  lidocaine-prilocaine, loperamide, ondansetron (ZOFRAN) IV, ondansetron, pentafluoroprop-tetrafluoroeth, DISCONTD: heparin Assessment/Plan:  Disposition Overall poor prognosis due to poorly controlled HIV and severe infections including endocarditis.[name] She will be discharged with IV Ancef and po  Bactrim. She can be discharged home with [facility] OT/RN/Aid since she has clinically improved.  Awaiting for clearance from cardiology, NM myoview today.   # Ventricular tachycardia  Asymptomatic VT for 15 secs on 05/27/11. CE x 3 negative. EKG mild T wave changes. LVEF 40-45% with no obvious regional wall motion on 05/21/11. ( she has ? Small MV vegetation). K 4.2 and Mg 1.9 both ok. HGB 7 yesterday with Negative FOBT likely due to ESRD and/or HD catheter placement. HGB 9.3 after 2 units PRBCs.   The etiology of her V tachy likely cardiomyopathy/anemia  - low dose BB started per cardiology PA recs  - awaiting for cardiology clearance before discharge.   #. Severe Sepsis , resolved  The etiologies of severe sepsis are likely HCAP vs PCP Vs HD catheter infection vs endocarditis.  - Blood culture>>>1/2 Staph aureus>>> Ancef with HD-  - for HCAP >>> Ancef  - for possible PCP>>>less likely to obtain sputum sample since she only has nonproductive cough.  >>> Bactrim po at 5mg /kg/hour daily for PCP Tx. She will need the treatment for 21 days  >>> ABG shows PO2 82 ans Aa gradient of 8>>>no need for steroids for now.  #Bacterremia and bacterial endocarditis  Staph aureus in 1/2 blood culture and two majors and two minors>>>confirmed  The source>>>left chest HD catheter  -HD catheter removed on 05/22/11. New HD catheter placed today  - Repeat blood culture x2 since the catheter is removed- negative to date.  - Ancef IV which HD  - will need 6 weeks tx, especially given her poorly controlled HIV.  # HCAP vs PCP PNA , improving  ESRD with HD patient is found to have B/L opacity with R>L on chest ray>>>HCAP  Poorly controlled HIV due to medical noncompliance>>>ca not rule out PCP  - crypto antigen>>>Negative  - Ancef, bactrim see above note  - symptomatic treatment for cough.  # ESRD with HD,per renal  # Diarrhea, resolved  - Negative C-diff PCR  - Probiotics  - imodium PRN  # Congestive cardiomyopathy   LV EF 40-45% Per TTE on 05/21/2011- possible small mitral valve vegetation.  Patient is not volume overload on admission. Caution IVF replacement for sepsis and septic shock  -Continue to monitor  # HIV infection  This is a chronic problem. She does have a poor compliance with her medications. Her last CD4 220 and VL [id number] on 02/19/11.  - will continue her home medications  - CD4 130 and VL [id number]  #Thrombocytopenia  Chronic stable. Likely due to end-stage renal disease.  # Anemia of chronic disease, baseline 8-9  Stable  - On Procrit  -Weekly Venofer  # Mild Hypertension  Baseline SBP ranges at 140's. Not on any medical treatment  .  # Malnutrition, likely due to renal disease  Albumin 1.5 on this admission  - Oral supplement  -Nutritionist consult  - albumin Tx given per renal  -Will not continue Megace since it increase risk of thrombosis especially patient is on Procrit.  # VTE: Heparin          LOS: 11 days   [name] 05/29/2011, 12:06 PM

## 2011-05-29 NOTE — Progress Notes (Signed)
Subjective:  No complaints  Objective:  Vital Signs in the last 24 hours: Temp:  [98.1 F (36.7 C)-98.7 F (37.1 C)] 98.2 F (36.8 C) (05/23 0820) Pulse Rate:  [43-67] 52  (05/23 0820) Resp:  [18-20] 18  (05/23 0820) BP: (110-141)/(54-65) 115/54 mmHg (05/23 0820) SpO2:  [92 %-100 %] 100 % (05/23 0820) Weight:  [54.613 kg (120 lb 6.4 oz)] 54.613 kg (120 lb 6.4 oz) (05/23 0611)  Intake/Output from previous day:  Intake/Output Summary (Last 24 hours) at 05/29/11 1340 Last data filed at 05/29/11 1130  Gross per 24 hour  Intake      3 ml  Output      0 ml  Net      3 ml    Physical Exam: General appearance: alert, cooperative and no distress Lungs: clear to auscultation bilaterally Heart: regular rate and rhythm   Rate: 40-60  Rhythm: normal sinus rhythm and sinus bradycardia  Lab Results:  Basename 05/28/11 0819 05/27/11 0515  WBC 3.9* 3.7*  HGB 9.3* 7.0*  PLT 182 195    Basename 05/27/11 1511 05/27/11 0515  NA 133* 134*  K 4.2 3.7  CL 99 102  CO2 27 27  GLUCOSE 97 87  BUN 14 11  CREATININE 3.28* 2.74*    Basename 05/28/11 0645 05/27/11 2326  TROPONINI <0.30 <0.30   Hepatic Function Panel  Basename 05/27/11 0515  PROT --  ALBUMIN 1.2*  AST --  ALT --  ALKPHOS --  BILITOT --  BILIDIR --  IBILI --   No results found for this basename: CHOL in the last 72 hours No results found for this basename: INR in the last 72 hours  Imaging: Imaging results have been reviewed  Cardiac Studies:  Assessment/Plan:   Principal Problem:  *Severe sepsis without septic shock Active Problems:  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 difficile colitis  ANEMIA OF CHRONIC DISEASE  THROMBOCYTOPENIA  Noncompliance  Protein calorie malnutrition  Cardiomyopathy EF 40-45% 2D this admission  Abnormal EKG, chronic AS TWI  NSVT (nonsustained ventricular tachycardia)   Plan- Low dose beta blocker added for NSVT  but pt now bradycardic and beta blocker stopped. Myoview ordered by Dr Debara Pickett today. Consider Toprol 12.5mg  daily. MD to see   Kerin Ransom PA-C 05/29/2011, 1:40 PM   56 y/o woman with mild-moderate LV dysfunction & MV vegetation c.w SBE.  WE were c/s for NSVT.  Bradycardic with BB - agree with low dose 1xdaily dose. Myoview completed today - to evaluate for ischemic source, will await results. -- would not pursue invasive evaluation until SBE fully Rx'd unless VT persists.  ? If could be related to MV vegetation.    No further arrythmia on BB, but bradycardic.  Otherwise hemodynamically stable.  Will follow along.  Leonie Man, M.D., M.S. [facility] [address] [place], [place]  [address]  [phone] Pager # [phone]  05/29/2011 4:06 PM

## 2011-05-29 NOTE — Procedures (Signed)
I was present at this dialysis session. I have reviewed the session itself and made appropriate changes.   Kelly Splinter, MD [employer] 05/29/2011, 4:54 PM

## 2011-05-29 NOTE — Progress Notes (Signed)
Nutrition Follow-up  Diet Order:  NPO for test. Previously was liberalized to regular. Pt was eat 25-75%. Does have Ensure Complete ordered BID. Per notes pt needs soft/easy to chew foods. Pt is not in room at time of RD visit, getting myoview done.  Recommend downgrade to mechanical soft diet.  Meds: Scheduled Meds:   . acidophilus  1 capsule Oral Daily  . antiseptic oral rinse  15 mL Mouth Rinse BID  . aspirin  81 mg Oral Daily  .  ceFAZolin (ANCEF) IV  2 g Intravenous Q T,Th,Sa-HD  . darbepoetin (ARANESP) injection - DIALYSIS  200 mcg Intravenous Q Thu-HD  . darunavir  800 mg Oral Q breakfast  . famotidine  20 mg Oral Daily  . feeding supplement  237 mL Oral BID BM  . heparin  5,000 Units Subcutaneous Q8H  . lamiVUDine  25 mg Oral Daily  . magnesium oxide  400 mg Oral BID  . midodrine  10 mg Oral TID WC  . multivitamin  1 tablet Oral Daily  . paricalcitol  8 mcg Intravenous Q T,Th,Sa-HD  . regadenoson  0.4 mg Intravenous Once  . ritonavir  100 mg Oral Q breakfast  . sodium chloride  25 mL/kg Intravenous Once  . sodium chloride  3 mL Intravenous Q12H  . stavudine  20 mg Oral Daily  . sulfamethoxazole-trimethoprim  2 tablet Oral QHS  . DISCONTD: metoprolol tartrate  12.5 mg Oral BID   Continuous Infusions:  PRN Meds:.acetaminophen, albumin human, diphenhydrAMINE, feeding supplement (NEPRO CARB STEADY), guaiFENesin-dextromethorphan, heparin, lidocaine, lidocaine-prilocaine, loperamide, ondansetron (ZOFRAN) IV, ondansetron, pentafluoroprop-tetrafluoroeth, DISCONTD: heparin  Labs:  CMP     Component Value Date/Time   NA 133* 05/27/2011 1511   K 4.2 05/27/2011 1511   CL 99 05/27/2011 1511   CO2 27 05/27/2011 1511   GLUCOSE 97 05/27/2011 1511   BUN 14 05/27/2011 1511   CREATININE 3.28* 05/27/2011 1511   CREATININE 4.98* 02/19/2011 1048   CALCIUM 8.2* 05/27/2011 1511   CALCIUM 7.6* 09/14/2007 1321   PROT 8.0 05/25/2011 2030   ALBUMIN 1.2* 05/27/2011 0515   AST 39* 05/25/2011 2030   ALT  <5 05/25/2011 2030   ALKPHOS 221* 05/25/2011 2030   BILITOT 0.2* 05/25/2011 2030   GFRNONAA 15* 05/27/2011 1511   GFRAA 17* 05/27/2011 1511   Phosphorus  Date/Time Value Range Status  05/27/2011  5:15 AM 2.7  2.3-4.6 (mg/dL) Final   Magnesium  Date/Time Value Range Status  05/27/2011  3:11 PM 1.9  1.5-2.5 (mg/dL) Final      Intake/Output Summary (Last 24 hours) at 05/29/11 1357 Last data filed at 05/29/11 1130  Gross per 24 hour  Intake      3 ml  Output      0 ml  Net      3 ml    Weight Status:  Down to 120 lbs, has not had HD since 5/21  Re-estimated needs:  1700-1900 kcal, 79-92 gm protein  Nutrition Dx:  Malnutrition, ongoing  Goal:  PO intake to meet 90-100% of estimated nutrition needs, unmet.   Intervention:   Recommend once diet is advanced after test, mechanical soft diet.  Continue with supplements as ordered   Monitor:  PO intake, weight, labs, I/O's   Coralee Rud Pager #:  [phone]

## 2011-05-29 NOTE — Progress Notes (Signed)
Subjective:  Feels OK. To have stress test today. No complaints.   Objective Vital signs in last 24 hours: Filed Vitals:   05/28/11 1430 05/28/11 2113 05/29/11 0611 05/29/11 0820  BP: 110/64 141/65 122/59 115/54  Pulse: 67 43 55 52  Temp: 98.7 F (37.1 C) 98.1 F (36.7 C) 98.5 F (36.9 C) 98.2 F (36.8 C)  TempSrc: Oral Oral Oral Oral  Resp: 20 20 18 18   Height:      Weight:   54.613 kg (120 lb 6.4 oz)   SpO2: 98% 94% 92% 100%   Weight change: -2.187 kg (-4 lb 13.1 oz)  Intake/Output Summary (Last 24 hours) at 05/29/11 1117 Last data filed at 05/29/11 0843  Gross per 24 hour  Intake    120 ml  Output      0 ml  Net    120 ml   Labs: Basic Metabolic Panel:  Lab [id number] 05/27/11 0515 05/26/11 0500 05/24/11 0530  NA 133* 134* 129* --  K 4.2 3.7 4.6 --  CL 99 102 97 --  CO2 27 27 26  --  GLUCOSE 97 87 68* --  BUN 14 11 28* --  CREATININE 3.28* 2.74* 5.07* --  CALCIUM 8.2* 7.8* 8.6 --  ALB -- -- -- --  PHOS -- 2.7 3.9 2.2*   Liver Function Tests:  Lab 05/27/11 0515 05/26/11 0500 05/25/11 2030 05/23/11 0550  AST -- -- 39* 40*  ALT -- -- <5 10  ALKPHOS -- -- 221* 276*  BILITOT -- -- 0.2* 0.3  PROT -- -- 8.0 7.8  ALBUMIN 1.2* 1.2* 1.3* --   CBC:  Lab 05/28/11 0819 05/27/11 0515 05/26/11 0500 05/25/11 2030 05/24/11 0530  WBC 3.9* 3.7* 4.0 -- --  NEUTROABS -- -- -- -- --  HGB 9.3* 7.0* 8.5* -- --  HCT 29.1* 22.1* 27.0* -- --  MCV 93.0 94.8 94.4 93.1 94.4  PLT 182 195 204 -- --   Physical Exam: General: Alert ,NAD Heart: RRR, 2/6 sem , no rub Lungs: sl decr. bs at bases , left sided faint rales Abdomen: soft, nontender Extremities: Dialysis Access:  Bipedal edema 1+, with muscle wasting, new L IJ tunneled HD cath  [facility], 4 hrs, 56 kg EDW. EPO 10,4000. Zemplar 8ug. No IV Fe (d/c'd in April). L IJ   Problem/Plan: 1. Sepsis 1/4 +MSSA and CNSS bacteremia/MV possible vegetation- course c/w sepsis and possible endocarditis, w tunnelled  HD cath as likely source of infection. Left IJ HD cath removed 5/16 and new R IJ placed yesterday, 5/20. Also has PNA.  IV antibiotics changed to Ancef.  2. ESRD / TTS at [place]- HD today, though if delayed due to stress test, patient doesn't want HD at night and we will bump her til tomorrow. At dry weight but +edema, likely she has lost wt. 2.9kg removed last HD without difficulty, will do the same this time.  3. Chronic hypotension/Volume- on Midodrine due to chronic hypotension issues.  4. Anemia - s/p 2u PRBC's 5/21 for Hb 7   Outpatient Epogen was 10,400 U per HD and also was getting weekly Venofer. Aranesp 200 mcg here, and weekly IV Fe. Hb up now over 9.  5. Metabolic bone disease - Ca 8.6, P 3.5 was 2,2 and prior1.55/14 on Zemplar 8 mcg per HD. Will continue. HD records show phoslo 1ac and tums 3ac with meals. Binders on hold for now, phos low 2-3 range 6. Nutrition - Alb 1.2, on Megace  as outpatient.- diet liberalized to regular - agree - needs foods easy to chew 7. HIV/AIDS - on antiviral meds + bactrim followed by ID. Viral load extremely high (1.5 million) 8. NSVT- cardiology recommended beta-blocker.  With her low BP's, would recommend only low-dose beta-blocker therapy, if any.  9. Disposition- per primary  Kelly Splinter  MD [place] [phone] pgr    [phone] cell 05/29/2011, 11:17 AM

## 2011-05-30 LAB — LIPID PANEL
Cholesterol: 147 mg/dL (ref 0–200)
HDL: 12 mg/dL — ABNORMAL LOW (ref >39)
LDL Cholesterol: 87 mg/dL (ref 0–99)
Total CHOL/HDL Ratio: 12.3 RATIO
Triglycerides: 238 mg/dL — ABNORMAL HIGH (ref <150)
VLDL: 48 mg/dL — ABNORMAL HIGH (ref 0–40)

## 2011-05-30 MED ORDER — METOPROLOL SUCCINATE 12.5 MG HALF TABLET: 12.5000 mg | ORAL_TABLET | Freq: Every day | ORAL | Status: DC

## 2011-05-30 MED ORDER — ENSURE COMPLETE PO LIQD: 237.0000 mL | Freq: Two times a day (BID) | ORAL | Status: DC

## 2011-05-30 MED ORDER — CEFAZOLIN SODIUM-DEXTROSE 2-3 GM-% IV SOLR: 2.0000 g | INTRAVENOUS | Status: AC

## 2011-05-30 MED ORDER — SULFAMETHOXAZOLE-TRIMETHOPRIM 800-160 MG PO TABS: 1.0000 | ORAL_TABLET | Freq: Every day | ORAL | Status: DC

## 2011-05-30 MED ORDER — NEPRO/CARBSTEADY PO LIQD: 237.0000 mL | ORAL | Status: DC | PRN

## 2011-05-30 MED ORDER — SULFAMETHOXAZOLE-TMP DS 800-160 MG PO TABS: 2.0000 | ORAL_TABLET | Freq: Every day | ORAL | Status: AC

## 2011-05-30 NOTE — Progress Notes (Addendum)
Subjective:  Up in room, no complaints  Objective:  Vital Signs in the last 24 hours: Temp:  [97.8 F (36.6 C)-98.2 F (36.8 C)] 98.2 F (36.8 C) (05/24 0615) Pulse Rate:  [52-92] 57  (05/24 0615) Resp:  [14-20] 20  (05/24 0615) BP: (74-117)/(33-67) 113/65 mmHg (05/24 0615) SpO2:  [93 %-100 %] 94 % (05/24 0615) Weight:  [52.4 kg (115 lb 8.3 oz)-55.1 kg (121 lb 7.6 oz)] 52.4 kg (115 lb 8.3 oz) (05/24 0615)  Intake/Output from previous day:  Intake/Output Summary (Last 24 hours) at 05/30/11 0806 Last data filed at 05/30/11 0600  Gross per 24 hour  Intake    243 ml  Output   3000 ml  Net  -2757 ml    Physical Exam: General appearance: alert, cooperative, cachectic and no distress Lungs: clear to auscultation bilaterally Heart: regular rate and rhythm   Rate: 50-90  Rhythm: normal sinus rhythm and no further PAT/NSVT  Lab Results:  Basename 05/29/11 1607 05/28/11 0819  WBC 3.3* 3.9*  HGB 9.6* 9.3*  PLT 199 182    Basename 05/29/11 1607 05/27/11 1511  NA 134* 133*  K 4.7 4.2  CL 99 99  CO2 29 27  GLUCOSE 67* 97  BUN 14 14  CREATININE 3.44* 3.28*    Basename 05/28/11 0645 05/27/11 2326  TROPONINI <0.30 <0.30   Hepatic Function Panel  Basename 05/29/11 1607  PROT --  ALBUMIN 1.4*  AST --  ALT --  ALKPHOS --  BILITOT --  BILIDIR --  IBILI --   No results found for this basename: CHOL in the last 72 hours No results found for this basename: INR in the last 72 hours  Imaging: Imaging results have been reviewed  Cardiac Studies:  Assessment/Plan:   Principal Problem:  *Severe sepsis without septic shock Active Problems:  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 difficile colitis  ANEMIA OF CHRONIC DISEASE  THROMBOCYTOPENIA  Noncompliance  Protein calorie malnutrition  Cardiomyopathy EF 40-45% 2D this admission  Abnormal EKG, chronic AS TWI  NSVT (nonsustained ventricular  tachycardia)   Plan- Myoview suggests inferior scar (not full thickness), no ischemia, although no specific WMA (global Hypokinesis) noted on echo. MD to review. Her rate is better, suggest Toprol 12.5 mg daily.    Kerin Ransom PA-C 05/30/2011, 8:06 AM  I have seen & examined the patient.  Reviewed the chart & personally reviewed both Myoview & Echo images.  Myoview suggests regional Inferior wall Hypokinesis (I was not able to see gated images), echo is less convincing for localized Hypokinesis.  The Myoview defect is certainly not full thickness suggesting subendocardial infarct (if infarct & not attenuation defect).  With preserved EF > 40%, best Rx would be BB.  She did get bradycardic on low dose BB - & would not want to consider PPM in setting of bacteremia & MV chord vegetation c/w SBE.   Would start with 12.5 mg Toprol qPM.   Clinically, she is stable -- BP much better.  She is on midodrine as OP though, making afterload reduction for cardiomyopathy unappealing.  No active CHF findings.  My suspicion is that her cardiomyopathy is truly Non-ischemic (perhaps related to HIV). She has a small vegetation on the one of the MV chordae --> will need full course of IV Abx for SBE per ID recommendation.  She will f/u with Dr. Debara Pickett as OP to address cardiomyopathy.  Leonie Man, M.D., M.S.  [address] [place], [place]  [address]  [phone] Pager # [phone]  05/30/2011 1:49 PM

## 2011-05-30 NOTE — Progress Notes (Signed)
Patient's telemetry and IV has been discontinued; patient states that her daughter will be here at 5:30pm to pick her up; Discharge instructions will be addressed with patient's daughter______________________________________________________________________________________________________________D. Owens Shark RN

## 2011-05-30 NOTE — Progress Notes (Addendum)
Occupational Therapy Treatment Patient Details Name: [name] MRN: [id number] DOB: 1955-06-21 Today's Date: 05/30/2011 Time: [id number] OT Time Calculation (min): 15 min  OT Assessment / Plan / Recommendation    Follow Up Recommendations  Skilled nursing facility versus [facility] services and 24hr supervision      Equipment Recommendations  None recommended by OT       Frequency Min 2X/week   Plan Discharge plan remains appropriate    Precautions / Restrictions Precautions Precautions: Fall Restrictions Weight Bearing Restrictions: No       ADL  Grooming: Performed;Supervision/safety;Set up;Wash/dry hands Where Assessed - Grooming: Unsupported standing Lower Body Dressing: Performed;Set up (don socks) Where Assessed - Lower Body Dressing: Supine, head of bed up (in long sitting) Transfers/Ambulation Related to ADLs: Pt supervision with RW ambulation. Slow gait speed. ADL Comments: Pt. educated on energy conservation techniques due to pt. with low activity tolerance and fatigues very quickly. Pt. demonstrated ability to cross foot over opposite knee to don/doff socks.       OT Goals Acute Rehab OT Goals OT Goal Formulation: With patient Time For Goal Achievement: 06/06/11 Potential to Achieve Goals: Good ADL Goals Pt Will Perform Grooming: Independently;Standing at sink ADL Goal: Grooming - Progress: Progressing toward goals Pt Will Perform Lower Body Dressing: Independently;Sit to stand from bed;Sit to stand from chair ADL Goal: Lower Body Dressing - Progress: Progressing toward goals Pt Will Transfer to Toilet: with modified independence;Ambulation;with DME;3-in-1 ADL Goal: Toilet Transfer - Progress: Progressing toward goals Additional ADL Goal #1: Pt will be Mod I with all ADL item retrieval. ADL Goal: Additional Goal #1 - Progress: Progressing toward goals  Visit Information  Last OT Received On: 05/30/11 Assistance Needed: +1          Cognition  Overall  Cognitive Status: Appears within functional limits for tasks assessed/performed Arousal/Alertness: Awake/alert Orientation Level: Appears intact for tasks assessed;Oriented X4 / Intact Behavior During Session: [facility] for tasks performed Cognition - Other Comments: Pt. with intermittent decreased safety awareness during session    Mobility Bed Mobility Bed Mobility: Supine to Sit Supine to Sit: 7: Independent Sitting - Scoot to Edge of Bed: 5: Supervision Details for Bed Mobility Assistance: supervision for safety Transfers Transfers: Sit to Stand;Stand to Sit Sit to Stand: 5: Supervision Stand to Sit: 5: Supervision;To chair/3-in-1;With armrests Details for Transfer Assistance: Mod verbal cues for hand placement on RW and bed to ensure safety during transfer         End of Session OT - End of Session Equipment Utilized During Treatment: Gait belt Activity Tolerance: Patient limited by fatigue Patient left: in bed;with call bell/phone within reach;with bed alarm set Nurse Communication: Mobility status   Imogen Maddalena, OTR/L Pager [phone] 05/30/2011, 3:02 PM

## 2011-05-30 NOTE — Progress Notes (Signed)
Patient has been discharge and just left unit, discharge instruction was reviewed with nurse and daughter, patient's daughter verbalizes understanding of discharge instructions___________________________D. Owens Shark RN

## 2011-05-30 NOTE — Discharge Instructions (Signed)
1. Follow up with the appointment with our clinic and cardiology office as scheduled.  2. You ID clinic will contact your for an appointment. 3. You will need to take Bactrim 2 tablets every night till June 1st, 2013, then you can go back to your old regimen. 4. You will receive IV antibiotic Ancef with your Hemodialysis till 07/05/11. 5. Will add Toprol XL 12.5 mg po every bedtime for your heart.

## 2011-05-30 NOTE — Progress Notes (Addendum)
Subjective: Patient feels ok. No c/o. Had Myoview and HD yesterday. No arrhythmia since yesterday morning. Had bradycardia yesterday and metoprolol was D/C'd by cardiology.  Objective: Vital signs in last 24 hours: Filed Vitals:   05/29/11 2143 05/29/11 2146 05/29/11 2155 05/30/11 0615  BP: 77/33 86/39 93/55  113/65  Pulse: 76   57  Temp: 98 F (36.7 C)   98.2 F (36.8 C)  TempSrc: Oral   Oral  Resp: 18   20  Height:      Weight:    115 lb 8.3 oz (52.4 kg)  SpO2: 93%   94%   Weight change: 1 lb 1.2 oz (0.487 kg)  Intake/Output Summary (Last 24 hours) at 05/30/11 0711 Last data filed at 05/30/11 0600  Gross per 24 hour  Intake    243 ml  Output   3000 ml  Net  -2757 ml   General: NAD. Cathectic and chronic ill appearing  Skin: No petechiae is noted on MM. No splinter hemorrhage, Jan[place]ay lesions or Osler nodes noted  Scattered skin abrasions on her back. Right FA a healing ulcer noted  Lungs: CTA B/L, left chest HD catheter.  Heart: RRR, S1, S2, 2-3 systolic murmur noted.  Abd: Soft, tenderness to palpation suprapubic area. BS x 4  Ext: PPP+1, no edema.   Lab Results: Basic Metabolic Panel:  Lab [id number] 05/27/11 1511 05/27/11 0515  Ramona Ruark 134* 133* --  K 4.7 4.2 --  CL 99 99 --  CO2 29 27 --  GLUCOSE 67* 97 --  BUN 14 14 --  CREATININE 3.44* 3.28* --  CALCIUM 8.5 8.2* --  MG -- 1.9 --  PHOS 3.2 -- 2.7   Liver Function Tests:  Lab 05/29/11 1607 05/27/11 0515 05/25/11 2030  AST -- -- 39*  ALT -- -- <5  ALKPHOS -- -- 221*  BILITOT -- -- 0.2*  PROT -- -- 8.0  ALBUMIN 1.4* 1.2* --   CBC:  Lab 05/29/11 1607 05/28/11 0819  WBC 3.3* 3.9*  NEUTROABS -- --  HGB 9.6* 9.3*  HCT 29.8* 29.1*  MCV 94.0 93.0  PLT 199 182   Cardiac Enzymes:  Lab 05/28/11 0645 05/27/11 2326 05/27/11 1511  CKTOTAL 38 41 42  CKMB 2.2 2.3 2.7  CKMBINDEX -- -- --  TROPONINI <0.30 <0.30 <0.30   CBG:  Lab 05/27/11 2322  GLUCAP 70   Micro Results: Recent Results (from the  past 240 hour(s))  CATH TIP CULTURE     Status: Normal   Collection Time   05/22/11  6:28 PM      Component Value Range Status Comment   Specimen Description CATH TIP CHEST LEFT   Final    Special Requests NONE   Final    Culture NO GROWTH 2 DAYS   Final    Report Status 05/25/2011 FINAL   Final   CULTURE, EXPECTORATED SPUTUM-ASSESSMENT     Status: Normal   Collection Time   05/23/11  8:10 AM      Component Value Range Status Comment   Specimen Description SPUTUM   Final    Special Requests EXPECT. SPU   Final    Sputum evaluation     Final    Value: THIS SPECIMEN IS ACCEPTABLE. RESPIRATORY CULTURE REPORT TO FOLLOW.   Report Status 05/23/2011 FINAL   Final   CULTURE, RESPIRATORY     Status: Normal   Collection Time   05/23/11  8:10 AM      Component Value Range Status  Comment   Specimen Description SPUTUM   Final    Special Requests NONE   Final    Gram Stain     Final    Value: ABUNDANT WBC PRESENT,BOTH PMN AND MONONUCLEAR     FEW SQUAMOUS EPITHELIAL CELLS PRESENT     RARE GRAM POSITIVE COCCI IN PAIRS   Culture FEW CANDIDA ALBICANS   Final    Report Status 05/25/2011 FINAL   Final   CULTURE, BLOOD (ROUTINE X 2)     Status: Normal   Collection Time   05/23/11  2:35 PM      Component Value Range Status Comment   Specimen Description BLOOD ARM RIGHT   Final    Special Requests BOTTLES DRAWN AEROBIC AND ANAEROBIC 10CC   Final    Culture  Setup Time [id number]   Final    Culture NO GROWTH 5 DAYS   Final    Report Status 05/29/2011 FINAL   Final   CULTURE, BLOOD (ROUTINE X 2)     Status: Normal   Collection Time   05/23/11  2:50 PM      Component Value Range Status Comment   Specimen Description BLOOD RIGHT HAND   Final    Special Requests BOTTLES DRAWN AEROBIC AND ANAEROBIC 10CC   Final    Culture  Setup Time [id number]   Final    Culture NO GROWTH 5 DAYS   Final    Report Status 05/29/2011 FINAL   Final   CLOSTRIDIUM DIFFICILE BY PCR     Status: Normal   Collection Time     05/23/11  4:16 PM      Component Value Range Status Comment   C difficile by pcr NEGATIVE  NEGATIVE  Final    Studies/Results: Nm Myocar Multi W/spect W/wall Motion / Ef  05/29/2011  *RADIOLOGY REPORT*  Clinical Data:  56 year old female with chest pain  MYOCARDIAL IMAGING WITH SPECT (REST AND PHARMACOLOGIC-STRESS) GATED LEFT VENTRICULAR WALL MOTION STUDY LEFT VENTRICULAR EJECTION FRACTION  Technique:  Resting myocardial SPECT imaging was initially performed after intravenous administration of radiopharmaceutical. Myocardial SPECT was subsequently performed after additional radiopharmaceutical injection during pharmacologic-stress supervised by the Cardiology staff.  Quantitative gated imaging was also performed to evaluate left ventricular wall motion, and estimate left ventricular ejection fraction.  Radiopharmaceutical:  Tc-82m Myoview 57mCi at rest and 66mCi during stress.  Comparison: none  Findings:  Technique: Study is adequate.  Perfusion: There is a large region of decreased counts within the mid and basilar segment of the inferior wall and septal wall.  This is consistent with infarction.  No evidence reversible ischemia.  Wall motion:   Hypokinesia of the inferior wall. Left ventricular dilatation.  Left ventricular ejection fraction:  Calculated left ventricular ejection fraction =  42%.  IMPRESSION:  1.  Large infarct of the inferior wall.  2. Inferior wall hypokinesia. 3.  Left ventricular dilatation. 4  Left ventricular ejection fraction equal 42%  Original Report Authenticated By: Suzy Bouchard, M.D.   Medications: I have reviewed the patient's current medications. Scheduled Meds:   . acidophilus  1 capsule Oral Daily  . antiseptic oral rinse  15 mL Mouth Rinse BID  . aspirin  81 mg Oral Daily  .  ceFAZolin (ANCEF) IV  2 g Intravenous Q T,Th,Sa-HD  . darbepoetin (ARANESP) injection - DIALYSIS  200 mcg Intravenous Q Thu-HD  . darunavir  800 mg Oral Q breakfast  . famotidine  20 mg  Oral Daily  . feeding supplement  237 mL Oral BID BM  . heparin  5,000 Units Subcutaneous Q8H  . lamiVUDine  25 mg Oral Daily  . magnesium oxide  400 mg Oral BID  . midodrine  10 mg Oral TID WC  . multivitamin  1 tablet Oral Daily  . paricalcitol  8 mcg Intravenous Q T,Th,Sa-HD  . regadenoson  0.4 mg Intravenous Once  . ritonavir  100 mg Oral Q breakfast  . sodium chloride  25 mL/kg Intravenous Once  . sodium chloride  3 mL Intravenous Q12H  . stavudine  20 mg Oral Daily  . sulfamethoxazole-trimethoprim  2 tablet Oral QHS  . DISCONTD: metoprolol tartrate  12.5 mg Oral BID   Continuous Infusions:  PRN Meds:.acetaminophen, albumin human, diphenhydrAMINE, feeding supplement (NEPRO CARB STEADY), guaiFENesin-dextromethorphan, heparin, lidocaine, lidocaine-prilocaine, loperamide, ondansetron (ZOFRAN) IV, ondansetron, pentafluoroprop-tetrafluoroeth, technetium tetrofosmin, technetium tetrofosmin, DISCONTD: heparin Assessment/Plan:  Addendum:  Her cardiologist Dr. Ellyn Hack has seen patient today and recs to start Toprol XL 12.5 mg po daily night.  He agrees to discharge patient home today. The hospital follow up visits with our IM clinic and Cardiology office are set up. I have contacted ID clinic staff who will call patient for outpatient appointment.   # Ventricular tachycardia  The etiology of her V tachy likely cardiomyopathy/anemia  Myoview done yesterday>>> 1Large infarct of the inferior wall.  2. Inferior wall hypokinesia.  3. Left ventricular dilatation.  4 Left ventricular ejection fraction equal 42%  - already on Aspirin 81  Mg po daily - Lipid panel>>>LDL 87>>>will not start her on statin.  - low dose BB started per cardiology>>>Bradycardia>>BB stopped by cardiology. - awaiting for cardiology clearance before discharge.   #. Severe Sepsis , resolved   #Bacterremia and bacterial endocarditis  - will need Ancef IV with HD - bactrim po for PCP x 21 days>>>end date June  1st  # HCAP vs PCP PNA , improving   # ESRD with HD,per renal  # Diarrhea, resolved  - Negative C-diff PCR   # Congestive cardiomyopathy  LV EF 40-45% Per TTE on 05/21/2011- possible small mitral valve vegetation.   -Continue to monitor  # HIV infection  This is a chronic problem. She does have a poor compliance with her medications. Her last CD4 220 and VL [id number] on 02/19/11.  - will continue her home medications  - CD4 130 and VL [id number]  #Thrombocytopenia  Chronic stable. Likely due to end-stage renal disease.  # Anemia of chronic disease, baseline 8-9  Stable  - On Procrit  -Weekly Venofer  # Mild Hypertension  Baseline SBP ranges at 140's. Not on any medical treatment  .  # Malnutrition, likely due to renal disease  Albumin 1.5 on this admission  - Oral supplement  -Nutritionist consult  -Will not continue Megace since it increase risk of thrombosis especially patient is on Procrit.  # VTE: Heparin          LOS: 12 days   Jaima Janney 05/30/2011, 7:11 AM

## 2011-05-30 NOTE — Progress Notes (Signed)
Subjective:  Feels OK. To have stress test today. No complaints.   Objective Vital signs in last 24 hours: Filed Vitals:   05/29/11 2146 05/29/11 2155 05/30/11 0615 05/30/11 0956  BP: 86/39 93/55 113/65 150/74  Pulse:   57 63  Temp:   98.2 F (36.8 C) 98.2 F (36.8 C)  TempSrc:   Oral Oral  Resp:   20 18  Height:      Weight:   52.4 kg (115 lb 8.3 oz)   SpO2:   94% 97%   Weight change: 0.487 kg (1 lb 1.2 oz)  Intake/Output Summary (Last 24 hours) at 05/30/11 1032 Last data filed at 05/30/11 0953  Gross per 24 hour  Intake    726 ml  Output   3000 ml  Net  -2274 ml   Labs: Basic Metabolic Panel:  Lab [id number] 05/27/11 1511 05/27/11 0515 05/26/11 0500  NA 134* 133* 134* --  K 4.7 4.2 3.7 --  CL 99 99 102 --  CO2 29 27 27  --  GLUCOSE 67* 97 87 --  BUN 14 14 11  --  CREATININE 3.44* 3.28* 2.74* --  CALCIUM 8.5 8.2* 7.8* --  ALB -- -- -- --  PHOS 3.2 -- 2.7 3.9   Liver Function Tests:  Lab 05/29/11 1607 05/27/11 0515 05/26/11 0500 05/25/11 2030  AST -- -- -- 39*  ALT -- -- -- <5  ALKPHOS -- -- -- 221*  BILITOT -- -- -- 0.2*  PROT -- -- -- 8.0  ALBUMIN 1.4* 1.2* 1.2* --   CBC:  Lab 05/29/11 1607 05/28/11 0819 05/27/11 0515 05/26/11 0500 05/25/11 2030  WBC 3.3* 3.9* 3.7* -- --  NEUTROABS -- -- -- -- --  HGB 9.6* 9.3* 7.0* -- --  HCT 29.8* 29.1* 22.1* -- --  MCV 94.0 93.0 94.8 94.4 93.1  PLT 199 182 195 -- --   Physical Exam: General: Alert ,NAD Heart: RRR, 2/6 sem , no rub Lungs: sl decr. bs at bases , left sided faint rales Abdomen: soft, nontender Extremities: Dialysis Access:  Bipedal edema 1+, with muscle wasting, new L IJ tunneled HD cath  [facility], 4 hrs, 56 kg EDW. EPO 10,4000. Zemplar 8ug. No IV Fe (d/c'd in April). L IJ   Problem/Plan: 1. Sepsis 1/4 +MSSA and CNSS bacteremia/MV possible vegetation- course c/w sepsis and possible endocarditis, w tunnelled HD cath as likely source of infection. Left IJ HD cath removed 5/16  and new R IJ TDC placed 5/20. Also had PNA.  IV antibiotics changed to Ancef.  2. ESRD / TTS at [place]- HD tomorrow if still here. OK for discharge from renal standpoint.  3. NSVT- cardiology recommended beta-blocker.  With her low BP's, would recommend only low-dose beta-blocker therapy, if any. Nuclear med stress test + for scar, no ischemia. 4. Chronic hypotension/Volume- on Midodrine due to chronic hypotension issues.  5. Anemia - s/p 2u PRBC's 5/21 for Hb 7   Outpatient Epogen was 10,400 U per HD and also was getting weekly Venofer. Aranesp 200 mcg here, and weekly IV Fe. Hb up now over 9.  6. Metabolic bone disease - Ca 8.6, P 3.5 was 2,2 and prior1.55/14 on Zemplar 8 mcg per HD. Will continue. HD records show phoslo 1ac and tums 3ac with meals. Binders on hold for now, phos low 2-3 range 7. Nutrition - Alb 1.2, on Megace as outpatient.- diet liberalized to regular - agree - needs foods easy to chew 8. HIV/AIDS -  on antiviral meds + bactrim followed by ID. Viral load extremely high (1.5 million) 9. Disposition- per primary  Kelly Splinter  MD [facility] [phone] pgr    [phone] cell 05/30/2011, 10:32 AM

## 2011-06-02 NOTE — Discharge Summary (Signed)
[facility] Discharge Note  Name: [name] DOBIAS MRN: [id number] DOB: 18-Jan-1955 56 y.o.  Date of Admission: 05/18/2011 12:16 PM Date of Discharge: 05/30/2011 Attending Physician: Dr. Larey Dresser Discharge Diagnosis: 1.Severe sepsis without septic shock 2.Bacteremia, likely secondary to HD catheter infection 3. 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 For pain.      ARANESP (ALBUMIN FREE) 150 MCG/0.3ML Soln   Generic drug: darbepoetin   Inject 150 mcg into the skin. Use weekly for Epogen 20000 units. Dialysis nurse to administer.      aspirin 81 MG tablet   Take 81 mg by mouth daily.      b complex vitamins tablet   Take 1 tablet by mouth daily.      b complex-vitamin c-folic acid 0.8 MG Tabs   Take 0.8 mg by mouth daily.      ceFAZolin 2-3 GM-% Solr   Commonly known as: ANCEF   Inject 50 mLs (2 g total) into the vein Every Tuesday,Thursday,and Saturday with dialysis.      darunavir 400 MG tablet   Commonly known as: PREZISTA   Take 2 tablets (800 mg total) by mouth daily with breakfast.      famotidine 20 MG tablet   Commonly known as: PEPCID   Take 20 mg by mouth daily.      feeding supplement Liqd   Take 237 mLs by mouth 2 (two) times daily between meals.      feeding supplement (NEPRO CARB STEADY) Liqd   Take 237 mLs by  mouth as needed (missed meal during dialysis.).      lamiVUDine 10 MG/ML solution   Commonly known as: EPIVIR   Take 1 teaspoon by mouth every day.      metoprolol succinate 12.5 mg Tb24   Commonly known as: TOPROL-XL   Take 0.5 tablets (12.5 mg total) by mouth at bedtime.      NORVIR 100 MG Tabs   Generic drug: ritonavir   Take 100 mg by mouth daily with breakfast.      sulfamethoxazole-trimethoprim 800-160 MG per tablet   Commonly known as: BACTRIM DS   Take 2 tablets by mouth at bedtime.      sulfamethoxazole-trimethoprim 800-160 MG per tablet   Commonly known as: BACTRIM DS,SEPTRA DS   Take 1 tablet by mouth daily. Take 1 tablet by mouth every Monday, Wednesday and Friday.      VENOFER 20 MG/ML injection   Generic drug: iron sucrose   100 mg IV on Tuesday HD(HD RN to dispense)      ZEMPLAR 5 MCG/ML injection   Generic drug: paricalcitol   4 micrograms IV- dispensed by dialysis nurse.      ZERIT 1 MG/ML Solr   Generic drug: Stavudine   Take 4 teaspoons(20 ml) by mouth every day.            Disposition and follow-up:   Ms.Najla A Picklesimer was  discharged from [facility] in Stable condition.   Follow-up Appointments: Follow-up Information    Follow up with Janell Quiet, MD on 06/09/2011. (at 315 pm)    Contact information:   [address] [place] [phone]     patient was admitted for treatment of her severe sepsis, bacteriuria, bacterial endocarditis, health care acquired pneumonia, and pneumocystis pneumonia.  She is discharged in a stable condition  1. she'll have her IV Ancef treatment with HD sessions and that end date is June 29th 13  2. she will take Bactrim DS 2 tablets QHS with End date on 06/07/11.  Then she'll resume her prophylactic treatment of Bactrim DS one tablet every Monday Wednesday Friday on 06/09/11.  Please insure medical compliance . 3. she was given to units of PRBCs for HGB 7 and her hemoglobin  was 9 upon discharge.  4. please repeat her chest x-ray in 6 weeks .  Please check CBC and BMP 5. she was started on Toprol-XL 12.5 mg by mouth daily night for control of her nonsustained V. tach and  treatment of cardiomyopathy.  Please ensure medical compliance.     Please follow up. (ID clinic will call you to set up a folow up appointment with Dr. Johnnye Sima. I ahve talked to Office staff Maudie Mercury)       Follow up with Pixie Casino, MD on 06/20/2011. (at 2:30 PM)    Contact information:   [address] [place] [phone]         Discharge Orders    Future Appointments: Provider: Department: Dept Phone: Center:   06/09/2011 10:15 AM Campbell Riches, MD [facility] [phone] [facility]   06/09/2011 3:15 PM Janell Quiet, MD [facility] [phone] [facility]      Consultations: Treatment Team:  Placido Sou, MD Dr. Glenetta Hew  Procedures Performed:  Nm Myocar Multi W/spect W/wall Motion / Ef  05/29/2011  *RADIOLOGY REPORT*  Clinical Data:  56 year old female with chest pain  MYOCARDIAL IMAGING WITH SPECT (REST AND PHARMACOLOGIC-STRESS) GATED LEFT VENTRICULAR WALL MOTION STUDY LEFT VENTRICULAR EJECTION FRACTION  Technique:  Resting myocardial SPECT imaging was initially performed after intravenous administration of radiopharmaceutical. Myocardial SPECT was subsequently performed after additional radiopharmaceutical injection during pharmacologic-stress supervised by the Cardiology staff.  Quantitative gated imaging was also performed to evaluate left ventricular wall motion, and estimate left ventricular ejection fraction.  Radiopharmaceutical:  Tc-39m Myoview 69mCi at rest and 13mCi during stress.  Comparison: none  Findings:  Technique: Study is adequate.  Perfusion: There is a large region of decreased counts within the mid and basilar segment of the inferior wall and septal wall.  This is consistent with infarction.  No evidence reversible  ischemia.  Wall motion:   Hypokinesia of the inferior wall. Left ventricular dilatation.  Left ventricular ejection fraction:  Calculated left ventricular ejection fraction =  42%.  IMPRESSION:  1.  Large infarct of the inferior wall.  2. Inferior wall hypokinesia. 3.  Left ventricular dilatation. 4  Left ventricular ejection fraction equal 42%  Original Report Authenticated By: Suzy Bouchard, M.D.   Dg Chest Port 1 View  05/26/2011  *RADIOLOGY REPORT*  Clinical Data: [facility] placement  PORTABLE CHEST - 1 VIEW  Comparison: 05/19/2011  Findings: Cardiomediastinal silhouette is stable.  There is a dual lumen right IJ catheter with tip in right atrium.  No diagnostic pneumothorax.  Bilateral perihilar airspace disease right greater than left  again noted.  Surgical clips in the left axilla again noted.  IMPRESSION: Dual lumen right IJ catheter with tip in right atrium.  No diagnostic pneumothorax.  Again noted bilateral perihilar airspace disease right greater than left.  Original Report Authenticated By: Lahoma Crocker, M.D.   Dg Chest Portable 1 View  05/19/2011  *RADIOLOGY REPORT*  Clinical Data: Sepsis.  Short of breath and cough.  Dialysis patient  PORTABLE CHEST - 1 VIEW  Comparison: 05/18/2011  Findings: Perihilar airspace disease bilaterally has progressed. Right lower lobe airspace disease also has progressed slightly.  No effusion is present.  Left jugular catheter tip in the right atrium.  Cardiac enlargement.  IMPRESSION: Progression of bilateral airspace disease which may be pneumonia or edema.  Original Report Authenticated By: Truett Perna, M.D.   Dg Chest Port 1 View  05/18/2011  *RADIOLOGY REPORT*  Clinical Data: Stomach pain  PORTABLE CHEST - 1 VIEW  Comparison: 12/30/2010  Findings: Cardiomegaly again noted.  Left IJ double lumen dialysis catheter is unchanged in position.  There is bilateral perihilar airspace disease airspace disease right greater than left.  This is suspicious for pneumonia  rather than asymmetric pulmonary edema. Clinical correlation is necessary.  IMPRESSION: Bilateral perihilar airspace disease right greater than left suspicious for pneumonia rather than pulmonary edema.  Clinical correlation is necessary.  Cardiomegaly again noted. Follow-up examination is recommended to assure resolution.  Original Report Authenticated By: Lahoma Crocker, M.D.   Dg Fluoro Guide Cv Line-no Report  05/26/2011  CLINICAL DATA: Insertion of Dialysis catheter   FLOURO GUIDE CV LINE  Fluoroscopy was utilized by the requesting physician.  No radiographic  interpretation.      2D Echo 05/21/11 - Left ventricle: The cavity size was normal. Wall thickness was increased in a pattern of moderate LVH. Systolic function was mildly to moderately reduced. The estimated ejection fraction was in the range of 40% to 45%. Wall motion was normal; there were no regional wall motion abnormalities. - Aortic valve: Mild regurgitation. - Line: A venous catheter was visualized in the superior vena cava, with its tip in the right atrium. No abnormal features noted. - Line: A venous catheter was visualized in the superior vena cava, with its tip in the right atrium. No abnormal features noted. -Mitral valve: Mildly thickened leaflets . There is a small bright mobile target which appears to be attached to chordae tendineae related to the anterior leaflet of the mitral valve. Cannot exclude vegetation. Consider TEE if clinically indicated.  Admission HPI:  This is 56 year old woman with PMH of HIV infection (last CD4 220, VL [id number] on 02/19/2011), ESRD with HD, HTN, C-diff and VRE in Urine in 2012, sepsis in Dec, 2012), who presents with fever and cough. Patient states that she started to have intermittent nonproductive cough a month ago. She felt hot and chills at times, however, she's not sure whether she had fever or not because she didn't take her temperatures. She denies any associated cold-like symptoms.  Denies wheezing or shortness breath. Denies chest pain, chest pressure or palpitation. About one week ago, Patient started to have intermittent nausea, diarrhea and lower abdomen pain. Denies vomiting. Her diarrhea is watery loose stools 4 times a day without bloody stools. She also reports decreased appetite and generalized fatigue and weakness. And she missed two sessions of HD on this Thursday and Saturday last week due to weakness. She states that she had no energy to go to her HD. Patient is here for further evaluation.  She was noted to be hypotensive with BP 80's/40's .  Of note, she was hospitalized in Dec, 2012 for sepsis and septic shock. She was noted to have MSSA bacteremia 2/2 her infected line graft site. She did have RLL PNA on admission. She was discharged with Ancef IV with HDs for 6 weeks.  Hospital Course by problem list: # Severe sepsis without septic shock     The patient was noted to be hypotensive with fever, tachycardia and tachypnea in the setting of infectious process including pneumonia, bacteremia 2/2 left  HD catheter infection, and bacterial endocarditis. Her lactic acid was elevated at 3.3.  The pulmonary critical care team was consulted and admitted the patient to ICU for treatment of her sepsis.  She responded well to IV fluid resuscitation.  And her underlying infections were managed during this admission. Her sepsis is completely resolved, and she is stable upon discharge.   # Bacteremia, likely secondary to HD catheter infection  Patient was noted to have staph hours in 1/2 blood culture.  Her IJ HD catheter appeared very dirty with food particles noted on admission.  The source of infection was likely her Left IJ HD cath which was removed on 05/22/11, and an new right IJ HD catheter placed on 05/26/11. Patient was initially treated with vancomycin and Zosyn, which was switched to IV Ancef.  Please see discussion below.  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  Her TTE on 05/21/14 showed " a small bright mobile target which appears to be attached to chordae tendineae related to the anterior leaflet of the mitral valve. Cannot exclude vegetation". Since her vegetation appeared to be small and she's not a surgical candidate, we decided not to pursue TEE. She was initially treated with vancomycin and Zosyn, and her antibiotic therapy was switched to IV Ancef after staph aureus was found in 1/2 blood cultures.  She was noted to have a negative blood culture on 05/23/11.  Thus she will need a total of 6 weeks of IV Ancef with her HD starting on 05/23/11. She was given a prescription for IV Ancef x16 doses with the end date on 07/05/11.  I have called her nephrologist Dr. Jonnie Finner and discussed above treatment plan with him.  Patient's symptoms have largely resolved upon discharge, and she will be discharged home today to continue her IV antibiotic treatment with her dialysis.    # Health care acquired pneumonia    Patient was admitted for evaluation of intermittent nonproductive cough for one month, and she was noted to have bilateral opacities with R>L on her chest x-ray. The diagnosis for health care acquired pneumonia is likely giving her history of ESRD with current hemodialysis treatment.  See above discussion bacterial endocarditis.  # pneumocystitis carinii pneumonia     Given her poorly controlled HIV/AIDS due to medical noncompliance and bilateral opacities found on chest x-ray, we could not rule out PCP pneumonia. We were unable to obtain satisfactory sputum sample due to her nonproductive cough.  And given her ongoing complicated medical problems and many  Co-morbidities, we will not pursue bronchoscopy or BAL.  She was started on Bactrim for treatment of PCP.  No indication for steroids treatment based on her ABG results with PO2 82 on room air.  Patient clinically improved after the  treatment.  She will be discharged home with Bactrim DS 2 tablets QHS.  The end date  is 06/07/11.  She will resume her Bactrim DS 1 tab every MWF for prophylactic treatment on 06/09/11.  # HIV/AIDS infection, poorly controlled   This is her chronic problem, which has been worsening due to her poor compliance with her ART treatment. Her last CD4 220 and VL [id number] on 02/19/11.  And she was noted to have CD4 130 and VL [id number] on this admission.  Her ART treatment was resumed during this admission, and she will be discharged home and continue to followup with infectious disease outpatient clinic.  I have called ID outpatient clinic staff who will contact patient to set up a followup visit  # ESRD with HD on TTS    She should continue to followup with her nephrologist on TTS schedule.  # Diarrhea    Patient was noted to have intermittent loose stools during this admission with negative C-Diff PCR x 2.  She was given Probiotics and Imodium, And her diarrhea resolved during this admission.  The etiology is likely multifactorial related to her chronic diseases and chronic antibiotics treatment.  # NSVT (nonsustained ventricular tachycardia), Large inferior infarct, age unknown Patient was noted to have asymptomatic nonsustained ventricular tachycardia during this hospitalization.  Her first episode was noted to last 15 seconds and she was started on metoprolol 12.5 mg by mouth twice a day by her cardiologist Dr. Ellyn Hack.  She was noted to have a short run of 8 beats ventricular tachycardia during the following day with overnight bradycardia.  A Myoview study showed large infarct of the inferior wall with inferior wall hypokinesis without reversible ischemia.  Dr. Ellyn Hack recommended to start patient with Toprol-XL 12.5 mg by mouth daily night to control her NSVT and treat her cardiomyopathy.  Her midodrine was discontinued due to concerns over its effect on afterload (makes afterload reduction for cardiomyopathy  unappealing).  Patient is stable upon discharge, and she will be discharged home with home health agency set up.  She will continue to followup with Dr. Allison Quarry group.  # Cardiomyopathy EF 40-45% 2D echo Dr. Ellyn Hack was consulted and indicated that her cardiomyopathy may be truly Non-ischemic (perhaps related to HIV).  Patient has no signs and symptoms of active CHF.  A low dose beta blocker is added to her regimen upon discharge.  She will followup with Dr. Allison Quarry group as an outpatient.  An appointment is set up.   # Hypertension, mild     Patient was noted to have a mild hypertension with SBP at 140s.  She was not on any home antihypertensive medications on admission.  A low dose beta blocker was added to her regimen for treatment of nonsustained ventricular tachycardia.  And It will also help her mild hypertension.  # ANEMIA OF CHRONIC DISEASE, baseline 8-8.5    Likely associated with her ESRD with HD and poorly controlled HIV/AIDS.  Patient was noted to have a decreased HH 7.0/22.1 from [facility] of 8.5/27 during this admission.  Her FOBT was negative for blood.  No source of acute bleeding was noted.  The etiology is most likely secondary to her chronic diseases.  She was given 2 units of PRBCs with her hemodialysis on 05/27/11, and her HH was 9.3/29.8 upon discharge.  She'll continue followup with her nephrologist as an outpatient.  # THROMBOCYTOPENIA, stable     Likely associated with her ESRD with HD and poorly controlled HIV/AIDS.  She will followup with her nephrologist for continuous monitoring as an outpatient.  # metabolic bone disease, likely associated  with her ESRD.    Stable, Treated by Renal team  # Protein calorie malnutrition    A history of chronic low albumin of 1.2-1.5 with multifactorial etiologies including her chronic disease, chronic decreased appetite, ESRD and HIV/AIDS.  The hospital dietitian was consulted and agreed to start patient on regular diet instead of renal  diet.  Oral supplements were provided between meals.  Patient used to take Megace for her appetite.  We will not continue Megace since it increases the risk of thrombosis especially in the setting of Procrit treatment.   # Medical Noncompliance    Patient has a long history of medical noncompliance which contributes to her poorly controlled HIV/AIDS. She also misses her Hemodialysis sessions and has frequently no shows for her ID clinic visit. During this admission, the importance of medical compliance was reinforced. I have requested Home health OT/RN/SW to ensure the continuity of care as an out patient. Patient has medicaid transportation assistance setup in the past for her needs to go to [place] office. The hospital SW discussed with patient about calling transportation line [phone] for her transportation needs to [place] office.      Discharge Vitals:  BP 112/66  Pulse 62  Temp(Src) 98.2 F (36.8 C) (Oral)  Resp 18  Ht 5\' 9"  (1.753 m)  Wt 115 lb 8.3 oz (52.4 kg)  BMI 17.06 kg/m2  SpO2 97%  Discharge Labs: No results found for this or any previous visit (from the past 24 hour(s)).  Signed: Covey Baller 06/02/2011, 7:00 PM   Time Spent on Discharge: 45 minutes

## 2011-06-09 ENCOUNTER — Encounter: Payer: Medicaid Other | Admitting: Internal Medicine

## 2011-06-09 ENCOUNTER — Telehealth: Payer: Self-pay | Admitting: *Deleted

## 2011-06-09 ENCOUNTER — Inpatient Hospital Stay: Payer: Medicaid Other | Admitting: Infectious Diseases

## 2011-06-09 NOTE — Telephone Encounter (Signed)
Patient referred to [facility] for no show, no showed in February also.  Recently discharged from [facility]

## 2011-06-11 ENCOUNTER — Telehealth: Payer: Self-pay

## 2011-06-11 NOTE — Telephone Encounter (Signed)
Inpt CSW recieved call from pt sister on 6/4 requesting long term placement assistance. CSW requested that pt sister identify [facility] agency in order to assist with placement from home-Today pt sister called back and relayed pt currently working with [place] contacted Clinic SW and faxed FL2 for assistance- CSW provided pt sister Enid Derry) with OPCSW contact info to f/u CSW available PRN for any additional needs Gerre Scull, [phone]

## 2011-06-13 ENCOUNTER — Telehealth: Payer: Self-pay | Admitting: Licensed Clinical Social Worker

## 2011-06-13 NOTE — Telephone Encounter (Signed)
Inpt CSW referred pt to CSW as pt and family requesting long-term SNF placement, Medicaid only.  CSW placed call to both pt and sister.  Left message on voicemail for pt.  Sister is out of town until Sunday.  CSW placed call to local SNF's to inquire on available long term Medicaid bed availability.  Availability found at [facility].  CSW spoke with Admissions, [place], fax [phone].  CSW informed Lupita Dawn will await return call from pt/family.  Anticipate FL-2 and office visit notes to be faxed after speaking with either patient and/or sister, Enid Derry.

## 2011-06-13 NOTE — Telephone Encounter (Signed)
I talked to [place] today and she is waiting the response from the patient about the choice of their nursing home.

## 2011-06-16 NOTE — Telephone Encounter (Signed)
CSW placed call to both pt and sister, Enid Derry.  Informed both pt and sister of availability at [facility], pt and sister in agreement.  CSW faxed chart info, while awaiting completion of FL-2.

## 2011-06-19 NOTE — Telephone Encounter (Signed)
CSW received call from [place] in the admissions office of [facility].  Melissa informed CSW [facility] was unable to accept pt, inquiring as to why pt does not have Medicare.  CSW placed call to pt - unavailable and pt's sister, left message.  CSW will refer sister to contact Melissa at [facility] to answer admission questions.

## 2011-06-23 ENCOUNTER — Encounter: Payer: Self-pay | Admitting: Licensed Clinical Social Worker

## 2011-06-23 NOTE — Telephone Encounter (Signed)
A user error has taken place: encounter opened in error, closed for administrative reasons.

## 2011-06-23 NOTE — Telephone Encounter (Signed)
CSW received call from pt's sister, Enid Derry, inquiring about the status of placement.  CSW informed sister of need to speak with Melissa at [facility] as to why pt does not have Medicare.  Sister states she just has yet to apply.  Notified pt's sister of [facility]'s inability to accept pt and referred to [facility].  CSW provided sister with contact information to [facility] to discuss specific to admission.

## 2011-06-27 NOTE — Telephone Encounter (Signed)
CSW left message with pt's sister, Enid Derry notifying Ms. Brueckner both [facility] and [facility] were unable to accept pt.  CSW will continue to work with finding a facilily.

## 2011-07-01 ENCOUNTER — Telehealth: Payer: Self-pay | Admitting: Licensed Clinical Social Worker

## 2011-07-01 NOTE — Telephone Encounter (Signed)
CSW attempting to find pt a long term medicaid only placement.  [facility] - no availability.  Awaiting return call from [facility].  Spoke with Judeen Hammans, admissions for [place].  [facility] has availability, faxed referral.  Awaiting response.

## 2011-07-02 ENCOUNTER — Telehealth: Payer: Self-pay | Admitting: Licensed Clinical Social Worker

## 2011-07-02 NOTE — Telephone Encounter (Signed)
Recv'd message from [place], facility is unable to extend bed offer at this time.

## 2011-07-02 NOTE — Telephone Encounter (Signed)
Referral faxed to [facility] for long term medicaid bed.  Admissions states Director will need to make decision.  CSW will await return call.

## 2011-07-02 NOTE — Telephone Encounter (Signed)
CSW left message inquiring of receipt of fax and if pt is appropriate for admission to their facility.  CSW left contact information requesting return call.

## 2011-07-03 NOTE — Telephone Encounter (Signed)
CSW received message from Admission at [facility].  [name] has declined to accept pt.  CSW will move forward with facility in [facility].  If [facility], unable to accept will discuss other options with family/pt.

## 2011-07-11 ENCOUNTER — Emergency Department (HOSPITAL_COMMUNITY): Payer: Medicaid Other

## 2011-07-11 ENCOUNTER — Encounter (HOSPITAL_COMMUNITY): Payer: Self-pay | Admitting: *Deleted

## 2011-07-11 ENCOUNTER — Inpatient Hospital Stay (HOSPITAL_COMMUNITY)
Admission: EM | Admit: 2011-07-11 | Discharge: 2011-07-14 | DRG: 977 | Disposition: A | Discharge status: Home Health | Payer: Medicaid Other | Attending: Internal Medicine | Admitting: Internal Medicine

## 2011-07-11 DIAGNOSIS — I38 Endocarditis, valve unspecified: Secondary | ICD-10-CM | POA: Diagnosis present

## 2011-07-11 DIAGNOSIS — Z91199 Patient's noncompliance with other medical treatment and regimen due to unspecified reason: Secondary | ICD-10-CM

## 2011-07-11 DIAGNOSIS — D638 Anemia in other chronic diseases classified elsewhere: Secondary | ICD-10-CM | POA: Diagnosis present

## 2011-07-11 DIAGNOSIS — D631 Anemia in chronic kidney disease: Secondary | ICD-10-CM | POA: Diagnosis present

## 2011-07-11 DIAGNOSIS — D696 Thrombocytopenia, unspecified: Secondary | ICD-10-CM

## 2011-07-11 DIAGNOSIS — N2581 Secondary hyperparathyroidism of renal origin: Secondary | ICD-10-CM | POA: Diagnosis present

## 2011-07-11 DIAGNOSIS — I120 Hypertensive chronic kidney disease with stage 5 chronic kidney disease or end stage renal disease: Secondary | ICD-10-CM | POA: Diagnosis present

## 2011-07-11 DIAGNOSIS — Z681 Body mass index (BMI) 19 or less, adult: Secondary | ICD-10-CM

## 2011-07-11 DIAGNOSIS — N039 Chronic nephritic syndrome with unspecified morphologic changes: Secondary | ICD-10-CM | POA: Diagnosis present

## 2011-07-11 DIAGNOSIS — Z992 Dependence on renal dialysis: Secondary | ICD-10-CM

## 2011-07-11 DIAGNOSIS — Z8701 Personal history of pneumonia (recurrent): Secondary | ICD-10-CM

## 2011-07-11 DIAGNOSIS — D61818 Other pancytopenia: Secondary | ICD-10-CM | POA: Diagnosis present

## 2011-07-11 DIAGNOSIS — Z792 Long term (current) use of antibiotics: Secondary | ICD-10-CM

## 2011-07-11 DIAGNOSIS — B20 Human immunodeficiency virus [HIV] disease: Principal | ICD-10-CM | POA: Diagnosis present

## 2011-07-11 DIAGNOSIS — A0472 Enterocolitis due to Clostridium difficile, not specified as recurrent: Secondary | ICD-10-CM | POA: Diagnosis present

## 2011-07-11 DIAGNOSIS — R109 Unspecified abdominal pain: Secondary | ICD-10-CM

## 2011-07-11 DIAGNOSIS — Z9119 Patient's noncompliance with other medical treatment and regimen: Secondary | ICD-10-CM

## 2011-07-11 DIAGNOSIS — I428 Other cardiomyopathies: Secondary | ICD-10-CM | POA: Diagnosis present

## 2011-07-11 DIAGNOSIS — D6959 Other secondary thrombocytopenia: Secondary | ICD-10-CM | POA: Diagnosis present

## 2011-07-11 DIAGNOSIS — N186 End stage renal disease: Secondary | ICD-10-CM | POA: Diagnosis present

## 2011-07-11 DIAGNOSIS — E41 Nutritional marasmus: Secondary | ICD-10-CM | POA: Diagnosis present

## 2011-07-11 DIAGNOSIS — F0390 Unspecified dementia without behavioral disturbance: Secondary | ICD-10-CM | POA: Diagnosis present

## 2011-07-11 DIAGNOSIS — Z7982 Long term (current) use of aspirin: Secondary | ICD-10-CM

## 2011-07-11 DIAGNOSIS — R509 Fever, unspecified: Secondary | ICD-10-CM

## 2011-07-11 LAB — CBC WITH DIFFERENTIAL/PLATELET
Basophils Absolute: 0 10*3/uL (ref 0.0–0.1)
Basophils Relative: 0 % (ref 0–1)
Eosinophils Absolute: 0 10*3/uL (ref 0.0–0.7)
Eosinophils Relative: 0 % (ref 0–5)
HCT: 32.8 % — ABNORMAL LOW (ref 36.0–46.0)
Hemoglobin: 10.6 g/dL — ABNORMAL LOW (ref 12.0–15.0)
Lymphocytes Relative: 46 % (ref 12–46)
Lymphs Abs: 0.7 10*3/uL (ref 0.7–4.0)
MCH: 31.5 pg (ref 26.0–34.0)
MCHC: 32.3 g/dL (ref 30.0–36.0)
MCV: 97.3 fL (ref 78.0–100.0)
Monocytes Absolute: 0.4 10*3/uL (ref 0.1–1.0)
Monocytes Relative: 24 % — ABNORMAL HIGH (ref 3–12)
Neutro Abs: 0.5 10*3/uL — ABNORMAL LOW (ref 1.7–7.7)
Neutrophils Relative %: 30 % — ABNORMAL LOW (ref 43–77)
Platelets: 135 10*3/uL — ABNORMAL LOW (ref 150–400)
RBC: 3.37 MIL/uL — ABNORMAL LOW (ref 3.87–5.11)
RDW: 15.7 % — ABNORMAL HIGH (ref 11.5–15.5)
WBC: 1.6 10*3/uL — ABNORMAL LOW (ref 4.0–10.5)

## 2011-07-11 LAB — COMPREHENSIVE METABOLIC PANEL
ALT: <5 U/L (ref 0–35)
AST: 14 U/L (ref 0–37)
Albumin: 1.6 g/dL — ABNORMAL LOW (ref 3.5–5.2)
Alkaline Phosphatase: 144 U/L — ABNORMAL HIGH (ref 39–117)
BUN: 31 mg/dL — ABNORMAL HIGH (ref 6–23)
CO2: 23 mEq/L (ref 19–32)
Calcium: 7.9 mg/dL — ABNORMAL LOW (ref 8.4–10.5)
Chloride: 96 mEq/L (ref 96–112)
Creatinine, Ser: 6.23 mg/dL — ABNORMAL HIGH (ref 0.50–1.10)
GFR calc Af Amer: 8 mL/min — ABNORMAL LOW (ref >90)
GFR calc non Af Amer: 7 mL/min — ABNORMAL LOW (ref >90)
Glucose, Bld: 70 mg/dL (ref 70–99)
Potassium: 3.5 mEq/L (ref 3.5–5.1)
Sodium: 130 mEq/L — ABNORMAL LOW (ref 135–145)
Total Bilirubin: 0.4 mg/dL (ref 0.3–1.2)
Total Protein: 7.7 g/dL (ref 6.0–8.3)

## 2011-07-11 LAB — LIPASE, BLOOD: Lipase: 30 U/L (ref 11–59)

## 2011-07-11 LAB — GLUCOSE, CAPILLARY: Glucose-Capillary: 114 mg/dL — ABNORMAL HIGH (ref 70–99)

## 2011-07-11 LAB — MRSA PCR SCREENING: MRSA by PCR: NEGATIVE

## 2011-07-11 LAB — LACTIC ACID, PLASMA: Lactic Acid, Venous: 1.2 mmol/L (ref 0.5–2.2)

## 2011-07-11 MED ORDER — RITONAVIR 100 MG PO TABS
100.0000 mg | ORAL_TABLET | Freq: Every day | ORAL | Status: DC
  Administered 2011-07-14: 100 mg via ORAL
  Filled 2011-07-11 (×3): qty 1

## 2011-07-11 MED ORDER — RENA-VITE PO TABS
1.0000 | ORAL_TABLET | Freq: Every day | ORAL | Status: DC
  Administered 2011-07-11 – 2011-07-13 (×3): 1 via ORAL
  Filled 2011-07-11 (×4): qty 1

## 2011-07-11 MED ORDER — SODIUM CHLORIDE 0.9 % IJ SOLN
3.0000 mL | Freq: Two times a day (BID) | INTRAMUSCULAR | Status: DC
  Administered 2011-07-11 – 2011-07-14 (×4): 3 mL via INTRAVENOUS

## 2011-07-11 MED ORDER — ACETAMINOPHEN 325 MG PO TABS
650.0000 mg | ORAL_TABLET | Freq: Once | ORAL | Status: AC
  Administered 2011-07-11: 650 mg via ORAL
  Filled 2011-07-11: qty 2

## 2011-07-11 MED ORDER — SODIUM CHLORIDE 0.9 % IJ SOLN: 3.0000 mL | INTRAMUSCULAR | Status: DC | PRN

## 2011-07-11 MED ORDER — SULFAMETHOXAZOLE-TMP DS 800-160 MG PO TABS
1.0000 | ORAL_TABLET | ORAL | Status: DC
  Administered 2011-07-11 – 2011-07-14 (×2): 1 via ORAL
  Filled 2011-07-11 (×2): qty 1

## 2011-07-11 MED ORDER — PIPERACILLIN-TAZOBACTAM 3.375 G IVPB
3.3750 g | Freq: Once | INTRAVENOUS | Status: AC
  Administered 2011-07-11: 3.375 g via INTRAVENOUS
  Filled 2011-07-11: qty 50

## 2011-07-11 MED ORDER — SODIUM CHLORIDE 0.9 % IV SOLN: 250.0000 mL | INTRAVENOUS | Status: DC | PRN

## 2011-07-11 MED ORDER — METOPROLOL SUCCINATE 12.5 MG HALF TABLET
12.5000 mg | ORAL_TABLET | Freq: Every day | ORAL | Status: DC
  Administered 2011-07-11 – 2011-07-13 (×2): 12.5 mg via ORAL
  Filled 2011-07-11 (×4): qty 1

## 2011-07-11 MED ORDER — IOHEXOL 300 MG/ML  SOLN
80.0000 mL | Freq: Once | INTRAMUSCULAR | Status: AC | PRN
  Administered 2011-07-11: 80 mL via INTRAVENOUS

## 2011-07-11 MED ORDER — DARUNAVIR ETHANOLATE 800 MG PO TABS
800.0000 mg | ORAL_TABLET | Freq: Every day | ORAL | Status: DC
Start: 2011-07-12 — End: 2011-07-14
  Administered 2011-07-12 – 2011-07-14 (×3): 800 mg via ORAL
  Filled 2011-07-11 (×4): qty 1

## 2011-07-11 MED ORDER — ACETAMINOPHEN 325 MG PO TABS: 650.0000 mg | ORAL_TABLET | Freq: Three times a day (TID) | ORAL | Status: DC | PRN

## 2011-07-11 MED ORDER — SODIUM CHLORIDE 0.9 % IV SOLN
Freq: Once | INTRAVENOUS | Status: AC
  Administered 2011-07-11: 11:00:00 via INTRAVENOUS

## 2011-07-11 MED ORDER — PIPERACILLIN-TAZOBACTAM IN DEX 2-0.25 GM/50ML IV SOLN
2.2500 g | Freq: Three times a day (TID) | INTRAVENOUS | Status: DC
  Administered 2011-07-12: 2.25 g via INTRAVENOUS
  Filled 2011-07-11 (×3): qty 50

## 2011-07-11 MED ORDER — HEPARIN SODIUM (PORCINE) 5000 UNIT/ML IJ SOLN
5000.0000 [IU] | Freq: Three times a day (TID) | INTRAMUSCULAR | Status: DC
  Administered 2011-07-11 – 2011-07-14 (×7): 5000 [IU] via SUBCUTANEOUS
  Filled 2011-07-11 (×11): qty 1

## 2011-07-11 MED ORDER — ONDANSETRON HCL 4 MG/2ML IJ SOLN: 4.0000 mg | Freq: Four times a day (QID) | INTRAMUSCULAR | Status: DC | PRN

## 2011-07-11 MED ORDER — VANCOMYCIN HCL IN DEXTROSE 1-5 GM/200ML-% IV SOLN
1000.0000 mg | Freq: Once | INTRAVENOUS | Status: AC
  Administered 2011-07-11: 1000 mg via INTRAVENOUS
  Filled 2011-07-11: qty 200

## 2011-07-11 MED ORDER — ONDANSETRON HCL 4 MG PO TABS: 4.0000 mg | ORAL_TABLET | Freq: Four times a day (QID) | ORAL | Status: DC | PRN

## 2011-07-11 MED ORDER — ENSURE COMPLETE PO LIQD
237.0000 mL | Freq: Two times a day (BID) | ORAL | Status: DC
  Administered 2011-07-12 – 2011-07-14 (×5): 237 mL via ORAL

## 2011-07-11 MED ORDER — STAVUDINE 20 MG PO CAPS
20.0000 mg | ORAL_CAPSULE | Freq: Every day | ORAL | Status: DC
  Administered 2011-07-11 – 2011-07-14 (×4): 20 mg via ORAL
  Filled 2011-07-11 (×4): qty 1

## 2011-07-11 MED ORDER — ASPIRIN 81 MG PO TABS: 81.0000 mg | ORAL_TABLET | Freq: Every day | ORAL | Status: DC

## 2011-07-11 MED ORDER — ASPIRIN 81 MG PO CHEW
81.0000 mg | CHEWABLE_TABLET | Freq: Every day | ORAL | Status: DC
  Administered 2011-07-11 – 2011-07-14 (×4): 81 mg via ORAL
  Filled 2011-07-11 (×4): qty 1

## 2011-07-11 MED ORDER — IOHEXOL 300 MG/ML  SOLN: 20.0000 mL | INTRAMUSCULAR | Status: DC

## 2011-07-11 MED ORDER — LAMIVUDINE 10 MG/ML PO SOLN
50.0000 mg | Freq: Every day | ORAL | Status: DC
  Administered 2011-07-11 – 2011-07-14 (×4): 50 mg via ORAL
  Filled 2011-07-11 (×4): qty 5

## 2011-07-11 MED ORDER — HYDROMORPHONE HCL PF 1 MG/ML IJ SOLN
1.0000 mg | Freq: Once | INTRAMUSCULAR | Status: AC
  Administered 2011-07-11: 1 mg via INTRAVENOUS
  Filled 2011-07-11: qty 1

## 2011-07-11 MED ORDER — ONDANSETRON HCL 4 MG/2ML IJ SOLN
4.0000 mg | Freq: Once | INTRAMUSCULAR | Status: AC
  Administered 2011-07-11: 4 mg via INTRAVENOUS
  Filled 2011-07-11: qty 2

## 2011-07-11 NOTE — Progress Notes (Signed)
Pt admitted to unit. Pt is alert and oriented to staff, call bell, and room. Bed in lowest position. Call bell within reach. Full assessment to Epic. Will continue to monitor. Henry Russel, RN

## 2011-07-11 NOTE — ED Provider Notes (Signed)
History     CSN: [id number]  Arrival date & time 07/11/11  [id number]   First MD Initiated Contact with Patient 07/11/11 [phone]      Chief Complaint  Patient presents with  . Abdominal Pain    (Consider location/radiation/quality/duration/timing/severity/associated sxs/prior treatment) Patient is a 56 y.o. female presenting with abdominal pain. The history is provided by the patient.  Abdominal Pain The primary symptoms of the illness include abdominal pain.   patient here with lower abdominal pain x1 day. Some nausea no vomiting. No change in her bowel habits. Fever noted. She does not make urine. She was scheduled for dialysis today. No prior history of same. Some dyspnea without cough. Nothing makes her symptoms better or worse and no medications used prior to arrival  Past Medical History  Diagnosis Date  . Human immunodeficiency virus (HIV) disease   . Hypertension   . ESRD (end stage renal disease)   . Dialysis patient     pt on dialysis since 2010.  [name] Clostridium difficile infection 04/04/10  . Bacteriuria, asymptomatic 04/04/10    Culture grew VRE sensitive to linesolid   . MGUS (monoclonal gammopathy of unknown significance)   . History of bacteremia     MSSA  . Renal insufficiency   . Anemia     Past Surgical History  Procedure Date  . Av fistula placement   . Insertion of dialysis catheter 12/30/2010    Procedure: INSERTION OF DIALYSIS CATHETER;  Surgeon: Elam Dutch, MD;  Location: [facility];  Service: Vascular;  Laterality: Left;  Insertion of left internal jugular dialysis catheter  . Vascular surgery   . Insertion of dialysis catheter 05/26/2011    Procedure: INSERTION OF DIALYSIS CATHETER;  Surgeon: Conrad [place], MD;  Location: [facility] OR;  Service: Vascular;  Laterality: N/A;  Insertion tunneled dialysis catheter in Right Internal Jugular with 23 cm catheter     Family History  Problem Relation Age of Onset  . Alcohol abuse      family h/o addiction/alcoholism  .  Diabetes Cousin     first degree relatives  . Kidney disease Mother   . Kidney disease Maternal Uncle   . Cancer Sister     History  Substance Use Topics  . Smoking status: Never Smoker   . Smokeless tobacco: Never Used  . Alcohol Use: No    OB History    Grav Para Term Preterm Abortions TAB SAB Ect Mult Living                  Review of Systems  Gastrointestinal: Positive for abdominal pain.  All other systems reviewed and are negative.    Allergies  Review of patient's allergies indicates no known allergies.  Home Medications   Current Outpatient Rx  Name Route Sig Dispense Refill  . ACETAMINOPHEN ER 650 MG PO TBCR Oral Take 650 mg by mouth every 8 (eight) hours as needed. For pain.    . ASPIRIN 81 MG PO TABS Oral Take 81 mg by mouth daily.      . B COMPLEX PO TABS Oral Take 1 tablet by mouth daily.      [name] NEPHRO-VITE 0.8 MG PO TABS Oral Take 0.8 mg by mouth daily.      [name] DARBEPOETIN ALFA-POLYSORBATE 150 MCG/0.3ML IJ SOLN Subcutaneous Inject 150 mcg into the skin. Use weekly for Epogen 20000 units. Dialysis nurse to administer.     [name] DARUNAVIR ETHANOLATE 400 MG PO TABS Oral Take 2 tablets (  800 mg total) by mouth daily with breakfast. 60 tablet 1  . ENSURE COMPLETE PO LIQD Oral Take 237 mLs by mouth 2 (two) times daily between meals. 60 Bottle 11  . IRON SUCROSE 20 MG/ML IV SOLN  100 mg IV on Tuesday HD(HD RN to dispense)     . LAMIVUDINE 10 MG/ML PO SOLN  Take 1 teaspoon by mouth every day.     [name] METOPROLOL SUCCINATE 12.5 MG HALF TABLET Oral Take 0.5 tablets (12.5 mg total) by mouth at bedtime. 30 tablet 11  . NEPRO/CARB STEADY PO LIQD Oral Take 237 mLs by mouth as needed (missed meal during dialysis.). 30 Can 11  . PARICALCITOL 5 MCG/ML IV SOLN  4 micrograms IV- dispensed by dialysis nurse.     [name] RITONAVIR 100 MG PO TABS Oral Take 100 mg by mouth daily with breakfast.     . STAVUDINE 1 MG/ML PO SOLR  Take 4 teaspoons(20 ml) by mouth every day.       BP 139/79   Pulse 95  Temp 103.6 F (39.8 C) (Oral)  Resp 22  SpO2 95%  Physical Exam  Nursing note and vitals reviewed. Constitutional: She is oriented to person, place, and time. She appears well-developed and well-nourished.  Non-toxic appearance. No distress.  HENT:  Head: Normocephalic and atraumatic.  Eyes: Conjunctivae, EOM and lids are normal. Pupils are equal, round, and reactive to light.  Neck: Normal range of motion. Neck supple. No tracheal deviation present. No mass present.  Cardiovascular: Normal rate, regular rhythm and normal heart sounds.  Exam reveals no gallop.   No murmur heard. Pulmonary/Chest: Effort normal and breath sounds normal. No stridor. No respiratory distress. She has no decreased breath sounds. She has no wheezes. She has no rhonchi. She has no rales.  Abdominal: Soft. Normal appearance and bowel sounds are normal. She exhibits no distension. There is generalized tenderness. There is no rigidity, no rebound, no guarding and no CVA tenderness.  Musculoskeletal: Normal range of motion. She exhibits no edema and no tenderness.  Neurological: She is alert and oriented to person, place, and time. She has normal strength. No cranial nerve deficit or sensory deficit. GCS eye subscore is 4. GCS verbal subscore is 5. GCS motor subscore is 6.  Skin: Skin is warm and dry. No abrasion and no rash noted.  Psychiatric: She has a normal mood and affect. Her speech is normal and behavior is normal.    ED Course  Procedures (including critical care time)   Labs Reviewed  COMPREHENSIVE METABOLIC PANEL  CBC WITH DIFFERENTIAL  LIPASE, BLOOD  CULTURE, BLOOD (ROUTINE X 2)  CULTURE, BLOOD (ROUTINE X 2)  LACTIC ACID, PLASMA   No results found.   No diagnosis found.    MDM  Patient given Tylenol for fever. She was started on vancomycin and Zosyn for her suspected pneumonia. Will admit her to teaching service. Blood cultures have been obtained        Leota Jacobsen,  MD 07/11/11 1425

## 2011-07-11 NOTE — ED Notes (Signed)
Report given to [id number]

## 2011-07-11 NOTE — H&P (Signed)
Hospital Admission Note Date: 07/11/2011  Patient name: [name] Medical record number: [id number] Date of birth: 1955/04/24 Age: 56 y.o. Gender: female PCP: Janell Quiet, MD  Medical Service:  Attending physician:  Dominic Pea    1st Contact: Tonia Brooms  Pager: [phone] 2nd Contact: Criss Alvine   [phone] After 5 pm or weekends: 1st Contact:  Intern pager   Pager: [phone] 2nd Contact:  Resident pager  Pager: [phone]  Chief Complaint: Abdominal pain  History of Present Illness: [name] is a 56 yo women w PMH of HIV/AIDS (last CD4 130 and VL [id number] from 05/18/10), ESRD on HD, HTN and recent hospitalization for OSSA bacteremia and endocarditis presenting with one day of abdominal and fever of 103.6. She complains today of abdominal pain that started this morning. She describes the pain as 5/10 cramping pain in her lower central abdomen. She says that the pain started this morning and comes and goes throughout the day. She denies any nausea, vomiting, diarrhea, constipation, chills or fever. She is having regular BM and has not had blood in her stool. Last BM was this morning.  She cannot identify any alleviating or aggravating factors of abdominal pain. She took tylenol for the pain without much help.  Otherwise she says she feels fine. She did notice a slight cough this morning but it was not productive. She was discharged from the hospital on 05/30/11 after a 13 day stay for treatment of OSSA bacteremia, bacterial endocarditis, and HCAP vs PCP. She was discharged on IV Ancef for OSSA endocarditis (course completed 07/05/11) and po Bactrim for suspected PCP pneumonia (course completed 06/07/11). She is on HAART therapy but says that she only takes it 2-3 times per week because she forgets.  She denies any new SOB, chest pain, rashes, visual changes numbness, or weakness.    Meds: Current Outpatient Rx  Name Route Sig Dispense Refill  . ACETAMINOPHEN ER 650 MG PO TBCR Oral Take  650 mg by mouth every 8 (eight) hours as needed. For pain.    . ASPIRIN 81 MG PO TABS Oral Take 81 mg by mouth daily.      . B COMPLEX PO TABS Oral Take 1 tablet by mouth daily.      [name] NEPHRO-VITE 0.8 MG PO TABS Oral Take 0.8 mg by mouth daily.      [name] DARBEPOETIN ALFA-POLYSORBATE 150 MCG/0.3ML IJ SOLN Subcutaneous Inject 150 mcg into the skin. Use weekly for Epogen 20000 units. Dialysis nurse to administer.     [name] DARUNAVIR ETHANOLATE 400 MG PO TABS Oral Take 2 tablets (800 mg total) by mouth daily with breakfast. 60 tablet 1  . ENSURE COMPLETE PO LIQD Oral Take 237 mLs by mouth 2 (two) times daily between meals. 60 Bottle 11  . IRON SUCROSE 20 MG/ML IV SOLN  100 mg IV on Tuesday HD(HD RN to dispense)     . LAMIVUDINE 10 MG/ML PO SOLN  Take 1 teaspoon by mouth every day.     [name] METOPROLOL SUCCINATE 12.5 MG HALF TABLET Oral Take 0.5 tablets (12.5 mg total) by mouth at bedtime. 30 tablet 11  . NEPRO/CARB STEADY PO LIQD Oral Take 237 mLs by mouth as needed (missed meal during dialysis.). 30 Can 11  . PARICALCITOL 5 MCG/ML IV SOLN  4 micrograms IV- dispensed by dialysis nurse.     [name] RITONAVIR 100 MG PO TABS Oral Take 100 mg by mouth daily with breakfast.     . STAVUDINE 1 MG/ML PO  SOLR  Take 4 teaspoons(20 ml) by mouth every day.       Allergies: Allergies as of 07/11/2011  . (No Known Allergies)   Past Medical History  Diagnosis Date  . Human immunodeficiency virus (HIV) disease   . Hypertension   . ESRD (end stage renal disease)   . Dialysis patient     pt on dialysis since 2010.  [name] Clostridium difficile infection 04/04/10  . Bacteriuria, asymptomatic 04/04/10    Culture grew VRE sensitive to linesolid   . MGUS (monoclonal gammopathy of unknown significance)   . History of bacteremia     MSSA  . Renal insufficiency   . Anemia    Past Surgical History  Procedure Date  . Av fistula placement   . Insertion of dialysis catheter 12/30/2010    Procedure: INSERTION OF DIALYSIS CATHETER;   Surgeon: Elam Dutch, MD;  Location: [facility];  Service: Vascular;  Laterality: Left;  Insertion of left internal jugular dialysis catheter  . Vascular surgery   . Insertion of dialysis catheter 05/26/2011    Procedure: INSERTION OF DIALYSIS CATHETER;  Surgeon: Conrad [place], MD;  Location: [facility] OR;  Service: Vascular;  Laterality: N/A;  Insertion tunneled dialysis catheter in Right Internal Jugular with 23 cm catheter    Family History  Problem Relation Age of Onset  . Alcohol abuse      family h/o addiction/alcoholism  . Diabetes Cousin     first degree relatives  . Kidney disease Mother   . Kidney disease Maternal Uncle   . Cancer Sister    History   Social History  . Marital Status: Single    Spouse Name: N/A    Number of Children: N/A  . Years of Education: N/A   Occupational History  . Not on file.   Social History Main Topics  . Smoking status: Never Smoker   . Smokeless tobacco: Never Used  . Alcohol Use: No  . Drug Use: No  . Sexually Active: No   Other Topics Concern  . Not on file   Social History Narrative   Lives with daughter(does not know dx). Oldest daughter does not dx.Current partner of may years,? fidelity    Review of Systems: 10 pt ROS negative except per HPI.  Physical Exam: Blood pressure 110/65, pulse 68, temperature 99.4 F (37.4 C), temperature source Oral, resp. rate 24, SpO2 98.00%. General: resting in bed, NAD HEENT: PERRL, EOMI, no scleral icterus Cardiac: 3/6 holosystolic murmur a LSB , slight apical heave, no rubs or gallops Pulm: clear to auscultation bilaterally, no wheezes, rales, or rhonchi Abd: soft, nontender, nondistended, BS present Ext: Prominent venous stasis changes, 2+ edema and hyperpigmentation of L lower extremity, 1+ edema RLE Neuro: alert and oriented X3, cranial nerves II-XII grossly intact  Skin - Dialysis catheter site at R chest clean, dry and intact without erythema and induration.  Multiple hyperpigmented  erosions and plagues over back, abdomen, and legs. No Janeway lesions, Osler's nodes or splinter hemorrhages.  Lab results: Basic Metabolic Panel:  Basename 07/11/11 1035  NA 130*  K 3.5  CL 96  CO2 23  GLUCOSE 70  BUN 31*  CREATININE 6.23*  CALCIUM 7.9*  MG --  PHOS --   Liver Function Tests:  Basename 07/11/11 1035  AST 14  ALT <5  ALKPHOS 144*  BILITOT 0.4  PROT 7.7  ALBUMIN 1.6*    Basename 07/11/11 1035  LIPASE 30  AMYLASE --   No results found  for this basename: AMMONIA:2 in the last 72 hours CBC:  Basename 07/11/11 1035  WBC 1.6*  NEUTROABS 0.5*  HGB 10.6*  HCT 32.8*  MCV 97.3  PLT 135*    Imaging results:  Dg Chest 2 View  07/11/2011  *RADIOLOGY REPORT*  Clinical Data: Abdominal pain.  Diarrhea.  Dialysis patient.  CHEST - 2 VIEW  Comparison: 05/26/2011.  05/19/2011.  Findings: Cardiomegaly is present.  The patchy densities present in the right midlung, likely representing post infectious/inflammatory changes in this patient with recent airspace disease in this region.  Mild thickening of the fissures is present.  Right IJ dialysis catheter is unchanged compared to 05/26/2011.  Surgical clips are present in the left axilla and right proximal upper arm. No effusion.  IMPRESSION: 1.  Unchanged right IJ dialysis catheter. 2.  Patchy density in the right midlung is most compatible with post infectious/inflammatory changes given the recent consolidation.  Original Report Authenticated By: Dereck Ligas, M.D.   Ct Abdomen Pelvis W Contrast  07/11/2011  *RADIOLOGY REPORT*  Clinical Data: Abdominal pain. Chronic renal failure.  Fever.  CT ABDOMEN AND PELVIS WITH CONTRAST  Technique:  Multidetector CT imaging of the abdomen and pelvis was performed following the standard protocol during bolus administration of intravenous contrast.  Contrast: 52mL OMNIPAQUE IOHEXOL 300 MG/ML  SOLN  Comparison: 09/10/2010 and 05/25/2010  Findings: The patient has diffuse anasarca.  There  is a small amount of ascites in the abdomen and pelvis.  The patient has several calcified gallstones.  Gallbladder is not distended.  There is an 11 mm stable lesion in the spleen, felt to be benign.  The pancreas and adrenal glands are normal.  Kidneys are atrophic with a stable 2 cm cyst on the lower pole of the right kidney. Chronic prominence of the uterus.  No dilated bowel.  No free air. No acute osseous abnormality.  IMPRESSION: Diffuse anasarca.  Tiny amount of ascites.  Chronic renal atrophy. Chronic cholelithiasis.  Original Report Authenticated By: Larey Seat, M.D.      Assessment & Plan by Problem:  #Fever Pt w new onset abdominal pain and febrile to 103.6. CXR showed patchy density in R midlung most c/w postinfectious/inflammatory changes, however, cannot exclude HCAP w recent admission and home dialysis. She had a mild cough this morning, but no sputum production, is not complaining of SOB and pulmonary exam demonstrates good air movement. She was started empirically on vanc and zosyn in the ED and has since defervesced to 97.4.  She recently completed Ancef treatment on 07/05/11 for bacteremia with follow-up blood culture negative after initiating abx therapy. In light of her recent admission for OSSA bacteremia and endocarditis, we will keep her to check blood cultures and monitor clinical status for recurrence. Two blood cultures were drawn prior to antibiotic therapy initiation in ED. Does not appear septic w no tachycardia, tachypnea or hypotension. Dialysis catheter site is clean. Meets 2 minor Duke criteria (predisposing condition, fever). - Continue vanc and zosyn - Follow-up blood cultures - Will continue prophylaxis for PCP (MWF Bactrim)  #HIV/AIDS Pt w longstanding h/o HIV and AIDS-defining illness and question of recent PCP pneumonia (05/18/11). Last CD4 130 and Viral load [id number]. Not compliant w HAART at home.  - PCP prophylaxis (Bactrim DS MWF) - Resume ART    #Abdominal pain Pt w one day of abdominal pain starting this morning w no N/V/D or constipation. CT scan negative, LFTs not elevated. Lactic acid nl (1.2). No clear abdominal source  of infection at this time.  #ESRD on HD Pt missed HD yesterday, Cr 6.23 on admission. Have spoken w renal today, they are aware of dialysis needs. - Dialysis tomorrow  #Anemia of Chronic Dz 2/2 ESRD and poorly controlled HIV/AIDS. Hb is 10.6 today, baseline is 8-8.5. - Stable  #Protein malnutrition Albumin 1.6, chronically low. Seen by nutrition last admission who recommended regular diet with Ensure supplementation. -Regular diet with Ensure between meals.   #Nonischemic Cardiomyopathy Seen by Dr. Ellyn Hack at last admission, suspect non-ischemic CM related to HIV/AIDS. EF 40-45% at last admission. On low-dose metoprolol at home, unclear if she has been taking med. No signs of decompensated heart failure today.  - Continue home metoprolol.  #Thrombocytopenia Long-standing problem, likely 2/2 HIV/AIDS and ESRD. - Stable, will monitor plts.  Signed: Tonia Brooms 07/11/2011, 4:10 PM

## 2011-07-11 NOTE — Progress Notes (Signed)
ANTIBIOTIC CONSULT NOTE - INITIAL  Pharmacy Consult for Vancomycin and Zosyn Indication: rule out pneumonia  No Known Allergies  Patient Measurements: Height: 5\' 9"  (175.3 cm) Weight: 121 lb 7.6 oz (55.1 kg) IBW/kg (Calculated) : 66.2   Vital Signs: Temp: 97.4 F (36.3 C) (07/05 1657) Temp src: Oral (07/05 1657) BP: 111/74 mmHg (07/05 1657) Pulse Rate: 72  (07/05 1657) Intake/Output from previous day:   Intake/Output from this shift:    Labs:  Basename 07/11/11 1035  WBC 1.6*  HGB 10.6*  PLT 135*  LABCREA --  CREATININE 6.23*   Estimated Creatinine Clearance: 9 ml/min (by C-G formula based on Cr of 6.23).  ESRD with HD on TTS  Microbiology: No results found for this or any previous visit (from the past 720 hour(s)).  Medical History: Past Medical History  Diagnosis Date  . Human immunodeficiency virus (HIV) disease   . Hypertension   . ESRD (end stage renal disease)   . Dialysis patient     pt on dialysis since 2010.  [name] Clostridium difficile infection 04/04/10  . Bacteriuria, asymptomatic 04/04/10    Culture grew VRE sensitive to linesolid   . MGUS (monoclonal gammopathy of unknown significance)   . History of bacteremia     MSSA  . Renal insufficiency   . Anemia     Medications:  Prescriptions prior to admission  Medication Sig Dispense Refill  . acetaminophen (TYLENOL) 650 MG CR tablet Take 650 mg by mouth every 8 (eight) hours as needed. For pain.      [name] aspirin 81 MG tablet Take 81 mg by mouth daily.        [name] b complex vitamins tablet Take 1 tablet by mouth daily.        [name] b complex-vitamin c-folic acid (NEPHRO-VITE) 0.8 MG TABS Take 0.8 mg by mouth daily.        . darbepoetin (ARANESP, ALBUMIN FREE,) 150 MCG/0.3ML SOLN Inject 150 mcg into the skin. Use weekly for Epogen 20000 units. Dialysis nurse to administer.       . darunavir (PREZISTA) 400 MG tablet Take 2 tablets (800 mg total) by mouth daily with breakfast.  60 tablet  1  . feeding  supplement (ENSURE COMPLETE) LIQD Take 237 mLs by mouth 2 (two) times daily between meals.  60 Bottle  11  . iron sucrose (VENOFER) 20 MG/ML injection 100 mg IV on Tuesday HD(HD RN to dispense)       . lamiVUDine (EPIVIR) 10 MG/ML solution Take 1 teaspoon by mouth every day.       . metoprolol succinate (TOPROL-XL) 12.5 mg TB24 Take 0.5 tablets (12.5 mg total) by mouth at bedtime.  30 tablet  11  . Nutritional Supplements (FEEDING SUPPLEMENT, NEPRO CARB STEADY,) LIQD Take 237 mLs by mouth as needed (missed meal during dialysis.).  30 Can  11  . paricalcitol (ZEMPLAR) 5 MCG/ML injection 4 micrograms IV- dispensed by dialysis nurse.       . ritonavir (NORVIR) 100 MG TABS Take 100 mg by mouth daily with breakfast.       . Stavudine (ZERIT) 1 MG/ML SOLR Take 4 teaspoons(20 ml) by mouth every day.        Assessment: 56 yo F presents to ED with one day hx abdominal pain and fever (Tm 103.6).  Pt with recent hospitalization for OSSA bacteremia and endocarditis s/p course of IV Ancef (last dose 07/05/11).  To start Vancomycin and Zosyn for HCAP vs. CAP.  Pt received  Vancomycin 1 gm IV x 1 in ED ~ 1100.  Blood cx pending.  PMH significant for ESRD, HIV/AIDS (HAART medication noncompliance), HTN, Anemia    Goal of Therapy:  Pre-HD Vancomycin level 15-25 mcg/ml  Plan:  Vancomycin in ED will serve as adequate loading dose for this patient.  Next Vancomycin dose of 500 mg after HD.  Anticipate this will be Sat 07/12/11 but will await orders from renal before ordering.  Zosyn 2.25 gm IV Q8h.  Next dose due 07/12/11 at 0600.  Follow-up blood cx, clinical progress, and HD schedule.  [employer], Pharm.D., BCPS Clinical Pharmacist Pager [phone] 07/11/2011 5:56 PM

## 2011-07-11 NOTE — ED Notes (Signed)
Pt changed into gown, waist up.

## 2011-07-11 NOTE — ED Notes (Signed)
Patient transported to X-ray 

## 2011-07-11 NOTE — ED Notes (Signed)
Patient from home with abdominal pain since this am.  Patient has history of same in the past.  Patient is a dialysis patient is today is her dialysis day.

## 2011-07-11 NOTE — ED Notes (Signed)
Patient transported to CT 

## 2011-07-12 ENCOUNTER — Inpatient Hospital Stay (HOSPITAL_COMMUNITY): Payer: Medicaid Other

## 2011-07-12 DIAGNOSIS — N186 End stage renal disease: Secondary | ICD-10-CM

## 2011-07-12 DIAGNOSIS — D649 Anemia, unspecified: Secondary | ICD-10-CM

## 2011-07-12 DIAGNOSIS — R509 Fever, unspecified: Secondary | ICD-10-CM

## 2011-07-12 DIAGNOSIS — B20 Human immunodeficiency virus [HIV] disease: Principal | ICD-10-CM

## 2011-07-12 DIAGNOSIS — E46 Unspecified protein-calorie malnutrition: Secondary | ICD-10-CM

## 2011-07-12 DIAGNOSIS — D696 Thrombocytopenia, unspecified: Secondary | ICD-10-CM

## 2011-07-12 LAB — RENAL FUNCTION PANEL
Albumin: 1.4 g/dL — ABNORMAL LOW (ref 3.5–5.2)
BUN: 42 mg/dL — ABNORMAL HIGH (ref 6–23)
CO2: 21 mEq/L (ref 19–32)
Calcium: 7.5 mg/dL — ABNORMAL LOW (ref 8.4–10.5)
Chloride: 95 mEq/L — ABNORMAL LOW (ref 96–112)
Creatinine, Ser: 7.2 mg/dL — ABNORMAL HIGH (ref 0.50–1.10)
GFR calc Af Amer: 7 mL/min — ABNORMAL LOW (ref >90)
GFR calc non Af Amer: 6 mL/min — ABNORMAL LOW (ref >90)
Glucose, Bld: 78 mg/dL (ref 70–99)
Phosphorus: 3.2 mg/dL (ref 2.3–4.6)
Potassium: 3.5 mEq/L (ref 3.5–5.1)
Sodium: 129 mEq/L — ABNORMAL LOW (ref 135–145)

## 2011-07-12 LAB — CBC WITH DIFFERENTIAL/PLATELET
Basophils Absolute: 0 10*3/uL (ref 0.0–0.1)
Basophils Relative: 2 % — ABNORMAL HIGH (ref 0–1)
Eosinophils Absolute: 0.1 10*3/uL (ref 0.0–0.7)
Eosinophils Relative: 5 % (ref 0–5)
HCT: 32.6 % — ABNORMAL LOW (ref 36.0–46.0)
Hemoglobin: 10.5 g/dL — ABNORMAL LOW (ref 12.0–15.0)
Lymphocytes Relative: 24 % (ref 12–46)
Lymphs Abs: 0.4 10*3/uL — ABNORMAL LOW (ref 0.7–4.0)
MCH: 31.2 pg (ref 26.0–34.0)
MCHC: 32.2 g/dL (ref 30.0–36.0)
MCV: 96.7 fL (ref 78.0–100.0)
Monocytes Absolute: 0.2 10*3/uL (ref 0.1–1.0)
Monocytes Relative: 16 % — ABNORMAL HIGH (ref 3–12)
Neutro Abs: 0.8 10*3/uL — ABNORMAL LOW (ref 1.7–7.7)
Neutrophils Relative %: 53 % (ref 43–77)
Platelets: 138 10*3/uL — ABNORMAL LOW (ref 150–400)
RBC: 3.37 MIL/uL — ABNORMAL LOW (ref 3.87–5.11)
RDW: 15.8 % — ABNORMAL HIGH (ref 11.5–15.5)
WBC: 1.5 10*3/uL — ABNORMAL LOW (ref 4.0–10.5)

## 2011-07-12 LAB — CLOSTRIDIUM DIFFICILE BY PCR: Toxigenic C. Difficile by PCR: POSITIVE — AB

## 2011-07-12 MED ORDER — ACETAMINOPHEN 325 MG PO TABS
650.0000 mg | ORAL_TABLET | Freq: Three times a day (TID) | ORAL | Status: DC | PRN
  Administered 2011-07-12: 650 mg via ORAL
  Filled 2011-07-12: qty 2

## 2011-07-12 MED ORDER — VANCOMYCIN HCL 500 MG IV SOLR
500.0000 mg | Freq: Once | INTRAVENOUS | Status: AC
  Administered 2011-07-12: 500 mg via INTRAVENOUS
  Filled 2011-07-12: qty 500

## 2011-07-12 MED ORDER — HEPARIN SODIUM (PORCINE) 1000 UNIT/ML DIALYSIS
2000.0000 [IU] | INTRAMUSCULAR | Status: DC | PRN
  Administered 2011-07-12: 2000 [IU] via INTRAVENOUS_CENTRAL
  Filled 2011-07-12: qty 2

## 2011-07-12 MED ORDER — PENTAFLUOROPROP-TETRAFLUOROETH EX AERO: 1.0000 "application " | INHALATION_SPRAY | CUTANEOUS | Status: DC | PRN

## 2011-07-12 MED ORDER — NEPRO/CARBSTEADY PO LIQD: 237.0000 mL | ORAL | Status: DC | PRN

## 2011-07-12 MED ORDER — HEPARIN SODIUM (PORCINE) 1000 UNIT/ML DIALYSIS
1000.0000 [IU] | INTRAMUSCULAR | Status: DC | PRN
  Filled 2011-07-12: qty 1

## 2011-07-12 MED ORDER — LIDOCAINE HCL (PF) 1 % IJ SOLN: 5.0000 mL | INTRAMUSCULAR | Status: DC | PRN

## 2011-07-12 MED ORDER — BIOTENE DRY MOUTH MT LIQD
15.0000 mL | Freq: Two times a day (BID) | OROMUCOSAL | Status: DC
  Administered 2011-07-12 – 2011-07-14 (×4): 15 mL via OROMUCOSAL

## 2011-07-12 MED ORDER — METRONIDAZOLE 500 MG PO TABS
500.0000 mg | ORAL_TABLET | Freq: Three times a day (TID) | ORAL | Status: DC
  Administered 2011-07-13 – 2011-07-14 (×6): 500 mg via ORAL
  Filled 2011-07-12 (×8): qty 1

## 2011-07-12 MED ORDER — ALTEPLASE 2 MG IJ SOLR
2.0000 mg | Freq: Once | INTRAMUSCULAR | Status: AC | PRN
  Filled 2011-07-12: qty 2

## 2011-07-12 MED ORDER — LIDOCAINE-PRILOCAINE 2.5-2.5 % EX CREA: 1.0000 "application " | TOPICAL_CREAM | CUTANEOUS | Status: DC | PRN

## 2011-07-12 MED ORDER — SODIUM CHLORIDE 0.9 % IV SOLN: 100.0000 mL | INTRAVENOUS | Status: DC | PRN

## 2011-07-12 NOTE — H&P (Signed)
INTERNAL MEDICINE TEACHING SERVICE Attending Admission Note  Date: 07/12/2011  Patient name: [name]  Medical record number: [id number]  Date of birth: 1955-12-31    I have seen and evaluated [name] and discussed their care with the Residency Team.  51 yr. Old unfortunate AAF w/ hx HIV, ESRD on HD (T,Th,Sat), NICM EF 25-30%, Anemia chronic disease, HTN, severe malnutrition, medical non-adherence, with recent history of admission for severe sepsis, likely recent HD catheter infection with bacteremia, bacterial endocarditis, HAP, PCP PNA, who returns with abdominal pain. She states she skipped HD on Thursday because she was out of town, but yesterday began to have abdominal pain. She describes it as suprapubic in location, sharp-dull in nature? She denies fever at home or chills at home. Admits to a dry cough that has been there for weeks. Admits to diarrhea this morning. Denies CP. Admits to some slight SOB. Denies any pain or drainage around her catheter site.  Physical Exam: Blood pressure 90/47, pulse 89, temperature 102 F (38.9 C), temperature source Oral, resp. rate 20, height 5\' 9"  (1.753 m), weight 55 kg (121 lb 4.1 oz), SpO2 92.00%.  General: Vital signs reviewed and noted. Well-developed, well-nourished, in no acute distress; alert, appropriate and cooperative throughout examination.  Head: Normocephalic, atraumatic.  Eyes: PERRL, EOMI, No signs of anemia or jaundince.  Nose: Mucous membranes moist, not inflammed, nonerythematous.  Throat: Oropharynx nonerythematous, no exudate appreciated.   Neck: No deformities, masses, or tenderness noted.Supple, No carotid Bruits, no JVD.  Lungs:  Normal respiratory effort. Clear to auscultation BL without crackles or wheezes.  Heart: RRR. S1 and S2 normal without gallop. Noted 3/6 holosytolic murmur.  Abdomen:  BS normoactive. Soft, Nondistended, very minimal suprapubic tenderness.  No masses or organomegaly.  Extremities: Trace  pitting edema.  Neurologic: A&O X3, CN II - XII are grossly intact. Motor strength is 5/5 in the all 4 extremities, Sensations intact to light touch, Cerebellar signs negative.  Skin: Numerous pigmented regions through upper back, lower back.    Lab results: Results for orders placed during the hospital encounter of 07/11/11 (from the past 24 hour(s))  COMPREHENSIVE METABOLIC PANEL     Status: Abnormal   Collection Time   07/11/11 10:35 AM      Component Value Range   Sodium 130 (*) 135 - 145 mEq/L   Potassium 3.5  3.5 - 5.1 mEq/L   Chloride 96  96 - 112 mEq/L   CO2 23  19 - 32 mEq/L   Glucose, Bld 70  70 - 99 mg/dL   BUN 31 (*) 6 - 23 mg/dL   Creatinine, Ser 6.23 (*) 0.50 - 1.10 mg/dL   Calcium 7.9 (*) 8.4 - 10.5 mg/dL   Total Protein 7.7  6.0 - 8.3 g/dL   Albumin 1.6 (*) 3.5 - 5.2 g/dL   AST 14  0 - 37 U/L   ALT <5  0 - 35 U/L   Alkaline Phosphatase 144 (*) 39 - 117 U/L   Total Bilirubin 0.4  0.3 - 1.2 mg/dL   GFR calc non Af Amer 7 (*) >90 mL/min   GFR calc Af Amer 8 (*) >90 mL/min  CBC WITH DIFFERENTIAL     Status: Abnormal   Collection Time   07/11/11 10:35 AM      Component Value Range   WBC 1.6 (*) 4.0 - 10.5 K/uL   RBC 3.37 (*) 3.87 - 5.11 MIL/uL   Hemoglobin 10.6 (*) 12.0 - 15.0 g/dL  HCT 32.8 (*) 36.0 - 46.0 %   MCV 97.3  78.0 - 100.0 fL   MCH 31.5  26.0 - 34.0 pg   MCHC 32.3  30.0 - 36.0 g/dL   RDW 15.7 (*) 11.5 - 15.5 %   Platelets 135 (*) 150 - 400 K/uL   Neutrophils Relative 30 (*) 43 - 77 %   Lymphocytes Relative 46  12 - 46 %   Monocytes Relative 24 (*) 3 - 12 %   Eosinophils Relative 0  0 - 5 %   Basophils Relative 0  0 - 1 %   Neutro Abs 0.5 (*) 1.7 - 7.7 K/uL   Lymphs Abs 0.7  0.7 - 4.0 K/uL   Monocytes Absolute 0.4  0.1 - 1.0 K/uL   Eosinophils Absolute 0.0  0.0 - 0.7 K/uL   Basophils Absolute 0.0  0.0 - 0.1 K/uL   WBC Morphology TOXIC GRANULATION    LIPASE, BLOOD     Status: Normal   Collection Time   07/11/11 10:35 AM      Component Value Range     Lipase 30  11 - 59 U/L  CULTURE, BLOOD (ROUTINE X 2)     Status: Normal (Preliminary result)   Collection Time   07/11/11 10:35 AM      Component Value Range   Specimen Description BLOOD ARM LEFT     Special Requests BOTTLES DRAWN AEROBIC AND ANAEROBIC 10CC     Culture  Setup Time 07/11/2011 15:14     Culture       Value:        BLOOD CULTURE RECEIVED NO GROWTH TO DATE CULTURE WILL BE HELD FOR 5 DAYS BEFORE ISSUING A FINAL NEGATIVE REPORT   Report Status PENDING    LACTIC ACID, PLASMA     Status: Normal   Collection Time   07/11/11 10:36 AM      Component Value Range   Lactic Acid, Venous 1.2  0.5 - 2.2 mmol/L  CULTURE, BLOOD (ROUTINE X 2)     Status: Normal (Preliminary result)   Collection Time   07/11/11  1:29 PM      Component Value Range   Specimen Description BLOOD FOREARM LEFT     Special Requests BOTTLES DRAWN AEROBIC AND ANAEROBIC 5CC     Culture  Setup Time 07/11/2011 22:47     Culture       Value:        BLOOD CULTURE RECEIVED NO GROWTH TO DATE CULTURE WILL BE HELD FOR 5 DAYS BEFORE ISSUING A FINAL NEGATIVE REPORT   Report Status PENDING    GLUCOSE, CAPILLARY     Status: Abnormal   Collection Time   07/11/11  5:10 PM      Component Value Range   Glucose-Capillary 114 (*) 70 - 99 mg/dL  MRSA PCR SCREENING     Status: Normal   Collection Time   07/11/11  5:29 PM      Component Value Range   MRSA by PCR NEGATIVE  NEGATIVE  RENAL FUNCTION PANEL     Status: Abnormal   Collection Time   07/12/11  6:05 AM      Component Value Range   Sodium 129 (*) 135 - 145 mEq/L   Potassium 3.5  3.5 - 5.1 mEq/L   Chloride 95 (*) 96 - 112 mEq/L   CO2 21  19 - 32 mEq/L   Glucose, Bld 78  70 - 99 mg/dL   BUN 42 (*) 6 -  23 mg/dL   Creatinine, Ser 7.20 (*) 0.50 - 1.10 mg/dL   Calcium 7.5 (*) 8.4 - 10.5 mg/dL   Phosphorus 3.2  2.3 - 4.6 mg/dL   Albumin 1.4 (*) 3.5 - 5.2 g/dL   GFR calc non Af Amer 6 (*) >90 mL/min   GFR calc Af Amer 7 (*) >90 mL/min  CBC WITH DIFFERENTIAL     Status:  Abnormal   Collection Time   07/12/11  6:05 AM      Component Value Range   WBC 1.5 (*) 4.0 - 10.5 K/uL   RBC 3.37 (*) 3.87 - 5.11 MIL/uL   Hemoglobin 10.5 (*) 12.0 - 15.0 g/dL   HCT 32.6 (*) 36.0 - 46.0 %   MCV 96.7  78.0 - 100.0 fL   MCH 31.2  26.0 - 34.0 pg   MCHC 32.2  30.0 - 36.0 g/dL   RDW 15.8 (*) 11.5 - 15.5 %   Platelets 138 (*) 150 - 400 K/uL   Neutrophils Relative 53  43 - 77 %   Lymphocytes Relative 24  12 - 46 %   Monocytes Relative 16 (*) 3 - 12 %   Eosinophils Relative 5  0 - 5 %   Basophils Relative 2 (*) 0 - 1 %   Neutro Abs 0.8 (*) 1.7 - 7.7 K/uL   Lymphs Abs 0.4 (*) 0.7 - 4.0 K/uL   Monocytes Absolute 0.2  0.1 - 1.0 K/uL   Eosinophils Absolute 0.1  0.0 - 0.7 K/uL   Basophils Absolute 0.0  0.0 - 0.1 K/uL   RBC Morphology TARGET CELLS      Imaging results:  Dg Chest 2 View  07/11/2011  *RADIOLOGY REPORT*  Clinical Data: Abdominal pain.  Diarrhea.  Dialysis patient.  CHEST - 2 VIEW  Comparison: 05/26/2011.  05/19/2011.  Findings: Cardiomegaly is present.  The patchy densities present in the right midlung, likely representing post infectious/inflammatory changes in this patient with recent airspace disease in this region.  Mild thickening of the fissures is present.  Right IJ dialysis catheter is unchanged compared to 05/26/2011.  Surgical clips are present in the left axilla and right proximal upper arm. No effusion.  IMPRESSION: 1.  Unchanged right IJ dialysis catheter. 2.  Patchy density in the right midlung is most compatible with post infectious/inflammatory changes given the recent consolidation.  Original Report Authenticated By: Dereck Ligas, M.D.   Ct Abdomen Pelvis W Contrast  07/11/2011  *RADIOLOGY REPORT*  Clinical Data: Abdominal pain. Chronic renal failure.  Fever.  CT ABDOMEN AND PELVIS WITH CONTRAST  Technique:  Multidetector CT imaging of the abdomen and pelvis was performed following the standard protocol during bolus administration of intravenous  contrast.  Contrast: 40mL OMNIPAQUE IOHEXOL 300 MG/ML  SOLN  Comparison: 09/10/2010 and 05/25/2010  Findings: The patient has diffuse anasarca.  There is a small amount of ascites in the abdomen and pelvis.  The patient has several calcified gallstones.  Gallbladder is not distended.  There is an 11 mm stable lesion in the spleen, felt to be benign.  The pancreas and adrenal glands are normal.  Kidneys are atrophic with a stable 2 cm cyst on the lower pole of the right kidney. Chronic prominence of the uterus.  No dilated bowel.  No free air. No acute osseous abnormality.  IMPRESSION: Diffuse anasarca.  Tiny amount of ascites.  Chronic renal atrophy. Chronic cholelithiasis.  Original Report Authenticated By: Larey Seat, M.D.     Assessment and Plan:  I agree with the formulated Assessment and Plan with the following changes: 65 yr. Old AAF w/ hx HIV, ESRD on HD (T,Th,Sat), NICM EF 25-30%, Anemia chronic disease, HTN, severe malnutrition, medical non-adherence, with recent history of admission for severe sepsis, likely recent HD catheter infection with bacteremia, bacterial endocarditis, HAP, PCP PNA, who returns with abdominal pain, with noted fever. 1) Fever: This is concerning given her history of endocarditis and HAP.  Her abdominal CT shows no evidence of abscess collection. She has been cultured. I would continue Vanc/Zosyn given her recent hospitalization and obtain ID consultation. She is noted to have a right IJ HD catheter that, given her history, may be a source of bacteremia if cultures are positive. She has a CXR with likely changes from previous HAP, I don't necessarily think she has a new infiltrate. She is hemodynamically stable and her lactic acid level is normal. If she is found to be bacteremic, this will present a challenge due to no usable AV fistula and need to have a catheter in for HD. 2) Suprapubic pain: She states she makes no urine.  I see she has complained of this before. She  is not significantly tender over this region. She has a hx of VRE in 3/13, may try to culture urine if possible. 3) Diarrhea: Given her recent hospitalization, send for Cdiff and monitor. 4) ESRD on HD T,Th, Sat: She will need to get HD while in house, consult nephrology. 5) Pancytopenia: Multiple reasons for this, given hx HIV and not taking therapy as Rx, and possibly underlying infection. 6) NICM: Monitor, place on tele. Watch for episodes of vtach given recent hx NSVtach previously hospitalization. Watch electrolytes.On metoprolol. 7) HIV: ID consult. She is likely not taking her meds often, may just discontinue. Poor prognosis. 8) hx PCP PNA: Continue Bactrim, she likely may not be taking this. I do not suspect PCP PNA at this time as source of fever. -Rest per resident physician note.  Dominic Pea, DO 7/6/20139:51 AM

## 2011-07-12 NOTE — Progress Notes (Signed)
INITIAL ADULT NUTRITION ASSESSMENT Date: 07/12/2011   Time: 5:59 PM Reason for Assessment: Nutrition risk   ASSESSMENT: Female 56 y.o.  Dx: Fever  INTERVENTION: Downgrade diet to dysphagia 3 thin per pt request. Magic cup BID as pt enjoys ice cream. Encouraged increased meal/supplement intake.   Hx:  Past Medical History  Diagnosis Date  . Human immunodeficiency virus (HIV) disease   . Hypertension   . ESRD (end stage renal disease)   . Dialysis patient     pt on dialysis since 2010.  [name] Clostridium difficile infection 04/04/10  . Bacteriuria, asymptomatic 04/04/10    Culture grew VRE sensitive to linesolid   . MGUS (monoclonal gammopathy of unknown significance)   . History of bacteremia     MSSA  . Renal insufficiency   . Anemia    Related Meds:  Scheduled Meds:   . antiseptic oral rinse  15 mL Mouth Rinse BID  . aspirin  81 mg Oral Daily  . darunavir  800 mg Oral Q breakfast  . feeding supplement  237 mL Oral BID BM  . heparin  5,000 Units Subcutaneous Q8H  . lamiVUDine  50 mg Oral Daily  . metoprolol succinate  12.5 mg Oral QHS  . multivitamin  1 tablet Oral QHS  . piperacillin-tazobactam (ZOSYN)  IV  3.375 g Intravenous Once  . ritonavir  100 mg Oral Q breakfast  . sodium chloride  3 mL Intravenous Q12H  . stavudine  20 mg Oral Daily  . sulfamethoxazole-trimethoprim  1 tablet Oral 3 times weekly  . vancomycin  500 mg Intravenous Once  . DISCONTD: piperacillin-tazobactam (ZOSYN)  IV  2.25 g Intravenous Q8H   Continuous Infusions:  PRN Meds:.sodium chloride, acetaminophen, ondansetron (ZOFRAN) IV, ondansetron, sodium chloride, DISCONTD: acetaminophen  Ht: 5\' 9"  (175.3 cm)  Wt: 121 lb 4.1 oz (55 kg)  Ideal Wt: 145 lb % Ideal Wt: 83  Usual Wt: 142 lb in January 2013 % Usual Wt: 85  Wt Readings from Last 5 Encounters:  07/11/11 121 lb 4.1 oz (55 kg)  05/30/11 115 lb 8.3 oz (52.4 kg)  05/30/11 115 lb 8.3 oz (52.4 kg)  01/22/11 142 lb (64.411 kg)  12/30/10  137 lb 12.6 oz (62.5 kg)    Body mass index is 17.91 kg/(m^2).  Food/Nutrition Related Hx: Pt reports altered taste since May 2013 and that foods taste bitter. Pt states she had dentures, top and bottom set, however they got broken and she does not have them with her. Pt interested in downgrading diet to dysphagia 3 diet. Pt states she drinks Ensure Complete BID. Pt states she has been eating 3 meals/day PTA. Pt thinks she has lost weight but is unsure how much or if it is fluid weight from dialysis. Noted pt's weight down 21 pounds since January 2013. Observed that pt had not touched any of her dinner tray.   Labs:  CMP     Component Value Date/Time   NA 129* 07/12/2011 0605   K 3.5 07/12/2011 0605   CL 95* 07/12/2011 0605   CO2 21 07/12/2011 0605   GLUCOSE 78 07/12/2011 0605   BUN 42* 07/12/2011 0605   CREATININE 7.20* 07/12/2011 0605   CREATININE 4.98* 02/19/2011 1048   CALCIUM 7.5* 07/12/2011 0605   CALCIUM 7.6* 09/14/2007 1321   PROT 7.7 07/11/2011 1035   ALBUMIN 1.4* 07/12/2011 0605   AST 14 07/11/2011 1035   ALT <5 07/11/2011 1035   ALKPHOS 144* 07/11/2011 1035   BILITOT 0.4  07/11/2011 1035   GFRNONAA 6* 07/12/2011 0605   GFRAA 7* 07/12/2011 [id number]    Intake/Output Summary (Last 24 hours) at 07/12/11 1810 Last data filed at 07/12/11 1300  Gross per 24 hour  Intake    720 ml  Output      0 ml  Net    720 ml   Last BM - 07/11/11  Diet Order: Renal   IVF:    Estimated Nutritional Needs:   Kcal: 1925-2000 Protein: 65-95g Fluid: Urine output plus 1,087ml   NUTRITION DIAGNOSIS: -Inadequate oral intake (NI-2.1).  Status: Ongoing -Pt meets criteria for moderate PCM of chronic illness AEB pt with mild muscle and fat loss with likely <75% estimated energy intake for the past 2 months r/t altered taste  RELATED TO: altered taste and improper fitting dentures  AS EVIDENCE BY: pt statement, significant weight loss PTA  MONITORING/EVALUATION(Goals): Pt to consume >90% of meals/supplements  EDUCATION  NEEDS: -Education needs addressed - encouraged small frequent meals/snacks to improve nutrition.   Dietitian #: [phone]  [facility] Per approved criteria  -Underweight -Moderate malnutrition in the context of chronic illness    Glory Rosebush 07/12/2011, 5:59 PM

## 2011-07-12 NOTE — Consult Note (Signed)
[facility]          Day 2 vancomycin        Day 2 piperacillin tazobactam       Reason for Consult: Fever and abdominal pain    Referring Physician: Dr. Tarri Abernethy  Principal Problem:  *Fever Active Problems:  Abdominal pain  Diarrhea  HIV INFECTION  ANEMIA OF CHRONIC DISEASE  Pancytopenia  ESRD (end stage renal disease)  Endocarditis by TTE 05/21/11  HCAP (healthcare-associated pneumonia)  Memory loss      . antiseptic oral rinse  15 mL Mouth Rinse BID  . aspirin  81 mg Oral Daily  . darunavir  800 mg Oral Q breakfast  . feeding supplement  237 mL Oral BID BM  . heparin  5,000 Units Subcutaneous Q8H  . lamiVUDine  50 mg Oral Daily  . metoprolol succinate  12.5 mg Oral QHS  . multivitamin  1 tablet Oral QHS  . piperacillin-tazobactam (ZOSYN)  IV  2.25 g Intravenous Q8H  . piperacillin-tazobactam (ZOSYN)  IV  3.375 g Intravenous Once  . ritonavir  100 mg Oral Q breakfast  . sodium chloride  3 mL Intravenous Q12H  . stavudine  20 mg Oral Daily  . sulfamethoxazole-trimethoprim  1 tablet Oral 3 times weekly  . vancomycin  1,000 mg Intravenous Once  . DISCONTD: aspirin  81 mg Oral Daily  . DISCONTD: iohexol  20 mL Oral Q1 Hr x 2    Recommendations: 1. Continue vancomycin 2. Discontinue piperacillin tazobactam 3. Await blood cultures 4. Check stool C. difficile PCR 5. Check CD4 and HIV viral load   Assessment: So far, there is no obvious cause for her fever and abdominal pain. If she starts having diarrhea she certainly needs a stool specimen for C. difficile PCR. I would repeat her CD4 count and viral load. If her CD4 count is down further we may need to do further stool studies for opportunistic pathogen such as Mycobacterium avium. Of course, this could be an early relapse of her MSSA bacteremia and she is also at high risk for MRSA bacteremia. I agree with empiric vancomycin pending blood cultures. I believe the right mid lung shadow on  her chest x-ray represents scar from her recent bout of pneumonia in May rather than active pneumonia now. I would recommend stopping piperacillin tazobactam as I am not sure she needs the gram-negative rod or anaerobic coverage. I will follow with you.   HPI: [name] is a 56 y.o. female with end-stage renal disease and HIV who recently completed a six-week course of cefazolin for MSSA bacteremia and possible mitral valve endocarditis. She finished her last dose on June 29. She was readmitted to the hospital yesterday because of abdominal pain and was noted to have a temperature of 103.6. She tells me that she started having diarrhea yesterday. Her nurse states that she has some soft to loose stool yesterday but none so far today. She denies any nausea or vomiting. She states that she has some dry cough. I am unclear from her history if this is new or chronic and unchanged. She tells me that she is unaware of her fever.  When asked what medicine she takes she tells me Bayer aspirin. She states that she has medicines for HIV but usually does not take them. She has a pillbox but states that she lost it. She says that she forgets to take her medications. She cannot name any of  her HIV medications. At the time of her hospitalization in May her CD4 count was 130 and her viral load was over 1.6 million.   Review of Systems: Review of systems not obtained due to patient factors.  Past Medical History  Diagnosis Date  . Human immunodeficiency virus (HIV) disease   . Hypertension   . ESRD (end stage renal disease)   . Dialysis patient     pt on dialysis since 2010.  [name] Clostridium difficile infection 04/04/10  . Bacteriuria, asymptomatic 04/04/10    Culture grew VRE sensitive to linesolid   . MGUS (monoclonal gammopathy of unknown significance)   . History of bacteremia     MSSA  . Renal insufficiency   . Anemia     History  Substance Use Topics  . Smoking status: Never Smoker   . Smokeless  tobacco: Never Used  . Alcohol Use: No    Family History  Problem Relation Age of Onset  . Alcohol abuse      family h/o addiction/alcoholism  . Diabetes Cousin     first degree relatives  . Kidney disease Mother   . Kidney disease Maternal Uncle   . Cancer Sister    No Known Allergies  OBJECTIVE: Blood pressure 95/59, pulse 79, temperature 102 F (38.9 C), temperature source Oral, resp. rate 19, height 5\' 9"  (1.753 m), weight 55 kg (121 lb 4.1 oz), SpO2 98.00%. General: Alert comfortable and eating chicken Skin: No acute rash. No redness or tenderness along her right upper chest hemodialysis catheter site Oral: No oropharyngeal lesions Lungs: Clear Cor: Regular S1 and S2 with a very early 1/6 systolic murmur Abdomen: Soft and not obviously tender with quiet bowel sounds Joints extremities: Normal  Microbiology: Recent Results (from the past 240 hour(s))  CULTURE, BLOOD (ROUTINE X 2)     Status: Normal (Preliminary result)   Collection Time   07/11/11 10:35 AM      Component Value Range Status Comment   Specimen Description BLOOD ARM LEFT   Final    Special Requests BOTTLES DRAWN AEROBIC AND ANAEROBIC 10CC   Final    Culture  Setup Time 07/11/2011 15:14   Final    Culture     Final    Value:        BLOOD CULTURE RECEIVED NO GROWTH TO DATE CULTURE WILL BE HELD FOR 5 DAYS BEFORE ISSUING A FINAL NEGATIVE REPORT   Report Status PENDING   Incomplete   CULTURE, BLOOD (ROUTINE X 2)     Status: Normal (Preliminary result)   Collection Time   07/11/11  1:29 PM      Component Value Range Status Comment   Specimen Description BLOOD FOREARM LEFT   Final    Special Requests BOTTLES DRAWN AEROBIC AND ANAEROBIC 5CC   Final    Culture  Setup Time 07/11/2011 22:47   Final    Culture     Final    Value:        BLOOD CULTURE RECEIVED NO GROWTH TO DATE CULTURE WILL BE HELD FOR 5 DAYS BEFORE ISSUING A FINAL NEGATIVE REPORT   Report Status PENDING   Incomplete   MRSA PCR SCREENING     Status:  Normal   Collection Time   07/11/11  5:29 PM      Component Value Range Status Comment   MRSA by PCR NEGATIVE  NEGATIVE Final     Michel Bickers, MD [facility] [phone] pager  [id number] cell 07/12/2011, 12:34 PM

## 2011-07-12 NOTE — Progress Notes (Signed)
Subjective: Pt is lying in bed talking on the phone when I enter. She says that she no longer has abdominal pain but that she had watery diarrhea this morning. Reports brown stool with no blood. Otherwise she states she feels well, is not having any chest pain, SOB, chills, dizziness, headache or new weakness. She will be dialyzed today, renal aware.  Objective: Vital signs in last 24 hours: Filed Vitals:   07/11/11 1657 07/11/11 2009 07/12/11 0427 07/12/11 0900  BP: 111/74 116/76 90/47 95/59   Pulse: 72 82 89 79  Temp: 97.4 F (36.3 C) 98.4 F (36.9 C) 102 F (38.9 C)   TempSrc: Oral Oral Oral Oral  Resp: 18 18 20 19   Height: 5\' 9"  (1.753 m)     Weight: 55.1 kg (121 lb 7.6 oz) 55 kg (121 lb 4.1 oz)    SpO2: 95% 100% 92% 98%   Weight change:   Intake/Output Summary (Last 24 hours) at 07/12/11 1033 Last data filed at 07/12/11 0900  Gross per 24 hour  Intake    480 ml  Output      0 ml  Net    480 ml   Exam HEENT: PERRL, EOMI, no scleral icterus  Cardiac: 3/6 holosystolic murmur a LSB , slight apical heave, no rubs or gallops  Pulm: clear to auscultation bilaterally, no wheezes, rales, or rhonchi  Abd: soft, nontender, nondistended, BS present  Ext: Prominent venous stasis changes, anasarca of L lower extremity, 2+ edema RLE  Neuro: alert and oriented X3, cranial nerves II-XII grossly intact  Skin - Dialysis catheter site at R chest clean, dry and intact without erythema and induration. Multiple hyperpigmented erosions and plagues over back, abdomen, and legs. No Janeway lesions, Osler's nodes or splinter hemorrhages.   Lab Results: Basic Metabolic Panel:  Lab [id number] 07/11/11 1035  NA 129* 130*  K 3.5 3.5  CL 95* 96  CO2 21 23  GLUCOSE 78 70  BUN 42* 31*  CREATININE 7.20* 6.23*  CALCIUM 7.5* 7.9*  MG -- --  PHOS 3.2 --   Liver Function Tests:  Lab 07/12/11 0605 07/11/11 1035  AST -- 14  ALT -- <5  ALKPHOS -- 144*  BILITOT -- 0.4  PROT -- 7.7  ALBUMIN  1.4* 1.6*    Lab 07/11/11 1035  LIPASE 30  AMYLASE --   CBC:  Lab 07/12/11 0605 07/11/11 1035  WBC 1.5* 1.6*  NEUTROABS 0.8* 0.5*  HGB 10.5* 10.6*  HCT 32.6* 32.8*  MCV 96.7 97.3  PLT 138* 135*   CBG:  Lab 07/11/11 1710  GLUCAP 114*    Micro Results: Recent Results (from the past 240 hour(s))  CULTURE, BLOOD (ROUTINE X 2)     Status: Normal (Preliminary result)   Collection Time   07/11/11 10:35 AM      Component Value Range Status Comment   Specimen Description BLOOD ARM LEFT   Final    Special Requests BOTTLES DRAWN AEROBIC AND ANAEROBIC 10CC   Final    Culture  Setup Time 07/11/2011 15:14   Final    Culture     Final    Value:        BLOOD CULTURE RECEIVED NO GROWTH TO DATE CULTURE WILL BE HELD FOR 5 DAYS BEFORE ISSUING A FINAL NEGATIVE REPORT   Report Status PENDING   Incomplete   CULTURE, BLOOD (ROUTINE X 2)     Status: Normal (Preliminary result)   Collection Time   07/11/11  1:29 PM  Component Value Range Status Comment   Specimen Description BLOOD FOREARM LEFT   Final    Special Requests BOTTLES DRAWN AEROBIC AND ANAEROBIC 5CC   Final    Culture  Setup Time 07/11/2011 22:47   Final    Culture     Final    Value:        BLOOD CULTURE RECEIVED NO GROWTH TO DATE CULTURE WILL BE HELD FOR 5 DAYS BEFORE ISSUING A FINAL NEGATIVE REPORT   Report Status PENDING   Incomplete   MRSA PCR SCREENING     Status: Normal   Collection Time   07/11/11  5:29 PM      Component Value Range Status Comment   MRSA by PCR NEGATIVE  NEGATIVE Final    Studies/Results: Dg Chest 2 View  07/11/2011  *RADIOLOGY REPORT*  Clinical Data: Abdominal pain.  Diarrhea.  Dialysis patient.  CHEST - 2 VIEW  Comparison: 05/26/2011.  05/19/2011.  Findings: Cardiomegaly is present.  The patchy densities present in the right midlung, likely representing post infectious/inflammatory changes in this patient with recent airspace disease in this region.  Mild thickening of the fissures is present.  Right IJ  dialysis catheter is unchanged compared to 05/26/2011.  Surgical clips are present in the left axilla and right proximal upper arm. No effusion.  IMPRESSION: 1.  Unchanged right IJ dialysis catheter. 2.  Patchy density in the right midlung is most compatible with post infectious/inflammatory changes given the recent consolidation.  Original Report Authenticated By: Dereck Ligas, M.D.   Ct Abdomen Pelvis W Contrast  07/11/2011  *RADIOLOGY REPORT*  Clinical Data: Abdominal pain. Chronic renal failure.  Fever.  CT ABDOMEN AND PELVIS WITH CONTRAST  Technique:  Multidetector CT imaging of the abdomen and pelvis was performed following the standard protocol during bolus administration of intravenous contrast.  Contrast: 95mL OMNIPAQUE IOHEXOL 300 MG/ML  SOLN  Comparison: 09/10/2010 and 05/25/2010  Findings: The patient has diffuse anasarca.  There is a small amount of ascites in the abdomen and pelvis.  The patient has several calcified gallstones.  Gallbladder is not distended.  There is an 11 mm stable lesion in the spleen, felt to be benign.  The pancreas and adrenal glands are normal.  Kidneys are atrophic with a stable 2 cm cyst on the lower pole of the right kidney. Chronic prominence of the uterus.  No dilated bowel.  No free air. No acute osseous abnormality.  IMPRESSION: Diffuse anasarca.  Tiny amount of ascites.  Chronic renal atrophy. Chronic cholelithiasis.  Original Report Authenticated By: Larey Seat, M.D.   Medications: I have reviewed the patient's current medications. Scheduled Meds:   . sodium chloride   Intravenous Once  . antiseptic oral rinse  15 mL Mouth Rinse BID  . aspirin  81 mg Oral Daily  . darunavir  800 mg Oral Q breakfast  . feeding supplement  237 mL Oral BID BM  . heparin  5,000 Units Subcutaneous Q8H  . HYDROmorphone  1 mg Intravenous Once  . lamiVUDine  50 mg Oral Daily  . metoprolol succinate  12.5 mg Oral QHS  . multivitamin  1 tablet Oral QHS  . ondansetron  4  mg Intravenous Once  . piperacillin-tazobactam (ZOSYN)  IV  2.25 g Intravenous Q8H  . piperacillin-tazobactam (ZOSYN)  IV  3.375 g Intravenous Once  . ritonavir  100 mg Oral Q breakfast  . sodium chloride  3 mL Intravenous Q12H  . stavudine  20 mg Oral Daily  .  sulfamethoxazole-trimethoprim  1 tablet Oral 3 times weekly  . vancomycin  1,000 mg Intravenous Once  . DISCONTD: aspirin  81 mg Oral Daily  . DISCONTD: iohexol  20 mL Oral Q1 Hr x 2   Continuous Infusions:  PRN Meds:.sodium chloride, acetaminophen, iohexol, ondansetron (ZOFRAN) IV, ondansetron, sodium chloride, DISCONTD: acetaminophen  Assessment/Plan: #Fever  Pt w new onset abdominal pain and febrile to 103.6. CXR showed patchy density in R midlung most c/w postinfectious/inflammatory changes, however, cannot exclude HCAP w recent admission and home dialysis. She had a mild yesterday morning, but no sputum production, is not complaining of SOB and pulmonary exam demonstrates good air movement. She was started empirically on vanc and zosyn in the ED and has since defervesced to 97.4 overnight but is again febrile this morning w T 102 and new complaint of watery diarrhea.   In light of her recent admission for OSSA bacteremia and endocarditis 2/2 to L IJ catheter infection, we are concerned that she main have seeded new R IJ catheter and fever may due to dialysis catheter infection. She remains clinically stable and catheter sight clean. She currently meets 2 minor Duke criteria (predisposing condition, fever) for endocarditis.  We will f/u blood cultures and monitor clinical status for decompensation. Dr. Megan Salon with ID to see today to evaluate for possible source of infection and abx recommendations, appreciate involvement. - Continue vanc and zosyn  - Follow-up blood cultures  - Follow-up ID recs, appreciate consult - Will continue prophylaxis for PCP (MWF Bactrim)   #HIV/AIDS  Pt w longstanding h/o HIV and AIDS-defining illness  and question of recent PCP pneumonia (05/18/11). Last CD4 130 and Viral load [id number]. Not compliant w HAART at home.  - PCP prophylaxis (Bactrim DS MWF)  - Resume ART   #Abdominal pain  Pt w one day of abdominal pain starting yesterday with no N/V/D. CT scan shows no source of infection, LFTs not elevated. Lactic acid nl (1.2). Pt is pain free this morning, but with watery BM.   - In light of recent hospitalization will check stool for C Diff  #ESRD on HD  Pt missed HD yesterday, Cr 6.23 on admission. Have spoken w renal today, they are aware of dialysis needs. Pt has had multiple attempts at AVF and recently vascular surgery has determined she is not a candidate for further fistula/graft placement for dialysis. As such she is catheter dependent which is challenging given history of recent bactermia and endocarditis 2/2 infected line. - Dialysis today    #Anemia of Chronic Dz  2/2 ESRD and poorly controlled HIV/AIDS. Hb is 10.5 today, baseline is 8-8.5.  - Stable   #Protein malnutrition  Albumin 1.6, chronically low. Seen by nutrition last admission who recommended regular diet with Ensure supplementation.  -Regular diet with Ensure between meals.   #Nonischemic Cardiomyopathy  Seen by Dr. Ellyn Hack at last admission, suspect non-ischemic CM related to HIV/AIDS. EF 40-45% at last admission. On low-dose metoprolol at home, unclear if she has been taking med. No signs of decompensated heart failure today.  - Continue home metoprolol.   #Thrombocytopenia  Long-standing problem, likely 2/2 HIV/AIDS and ESRD.  - Stable, will monitor plts.  Dispo Pt w multiple attempts at facility placement in last month. Have consulted social work to help with dispo planning.    LOS: 1 day   Tonia Brooms 07/12/2011, 10:33 AM

## 2011-07-13 DIAGNOSIS — R109 Unspecified abdominal pain: Secondary | ICD-10-CM

## 2011-07-13 LAB — BASIC METABOLIC PANEL
BUN: 15 mg/dL (ref 6–23)
CO2: 26 mEq/L (ref 19–32)
Calcium: 7.4 mg/dL — ABNORMAL LOW (ref 8.4–10.5)
Chloride: 101 mEq/L (ref 96–112)
Creatinine, Ser: 3.38 mg/dL — ABNORMAL HIGH (ref 0.50–1.10)
GFR calc Af Amer: 17 mL/min — ABNORMAL LOW (ref >90)
GFR calc non Af Amer: 14 mL/min — ABNORMAL LOW (ref >90)
Glucose, Bld: 96 mg/dL (ref 70–99)
Potassium: 4.4 mEq/L (ref 3.5–5.1)
Sodium: 135 mEq/L (ref 135–145)

## 2011-07-13 LAB — CBC
HCT: 30.1 % — ABNORMAL LOW (ref 36.0–46.0)
Hemoglobin: 9.8 g/dL — ABNORMAL LOW (ref 12.0–15.0)
MCH: 31.3 pg (ref 26.0–34.0)
MCHC: 32.6 g/dL (ref 30.0–36.0)
MCV: 96.2 fL (ref 78.0–100.0)
Platelets: 121 10*3/uL — ABNORMAL LOW (ref 150–400)
RBC: 3.13 MIL/uL — ABNORMAL LOW (ref 3.87–5.11)
RDW: 15.7 % — ABNORMAL HIGH (ref 11.5–15.5)
WBC: 1.5 10*3/uL — ABNORMAL LOW (ref 4.0–10.5)

## 2011-07-13 MED ORDER — VANCOMYCIN HCL 500 MG IV SOLR: 500.0000 mg | INTRAVENOUS | Status: DC

## 2011-07-13 NOTE — Progress Notes (Signed)
Clinical Social Work Department CLINICAL SOCIAL WORK PLACEMENT NOTE 07/13/2011  Patient:  [name], [name]  Account Number:  [id number] Admit date:  07/11/2011  Clinical Social Worker:  Robbi Garter  Date/time:  07/13/2011 04:55 PM  Clinical Social Work is seeking post-discharge placement for this patient at the following level of care:   SKILLED NURSING   (*CSW will update this form in Epic as items are completed)   07/13/2011  Patient/family provided with [facility] Department of Clinical Social Work's list of facilities offering this level of care within the geographic area requested by the patient (or if unable, by the patient's family).  07/13/2011  Patient/family informed of their freedom to choose among providers that offer the needed level of care, that participate in Medicare, Medicaid or managed care program needed by the patient, have an available bed and are willing to accept the patient.  07/13/2011  Patient/family informed of [facility]' ownership interest in [facility], as well as of the fact that they are under no obligation to receive care at this facility.  PASARR submitted to EDS on 07/13/2011 PASARR number received from EDS on 07/13/2011  FL2 transmitted to all facilities in geographic area requested by pt/family on  07/13/2011 FL2 transmitted to all facilities within larger geographic area on   Patient informed that his/her managed care company has contracts with or will negotiate with  certain facilities, including the following:     Patient/family informed of bed offers received:   Patient chooses bed at  Physician recommends and patient chooses bed at    Patient to be transferred to  on   Patient to be transferred to facility by   The following physician request were entered in Epic:   Additional Comments:  Wandra Feinstein, MSW, [place] (weekend)

## 2011-07-13 NOTE — Progress Notes (Signed)
Clinical Social Work Department BRIEF PSYCHOSOCIAL ASSESSMENT 07/13/2011  Patient:  [name] [name], [name] [name]     Account Number:  [id number]     Admit date:  07/11/2011  Clinical Social Worker:  Robbi Garter  Date/Time:  07/13/2011 04:30 PM  Referred by:  Physician  Date Referred:  07/12/2011 Referred for  SNF Placement   Other Referral:   Interview type:  Patient Other interview type:    PSYCHOSOCIAL DATA Living Status:  FAMILY Admitted from facility:   Level of care:   Primary support name:  [name] [name] [phone] Primary support relationship to patient:  FAMILY Degree of support available:   Adequate    CURRENT CONCERNS Current Concerns  Post-Acute Placement   Other Concerns:    SOCIAL WORK ASSESSMENT / PLAN CSW met with pt at bedside and spoke to pt's sister, [name] [name] [phone], via phone re: long term SNF placement.  Pt admitted from home where she lives with her dtr/[name] [name] who is able to provide 24 hour assist/supervision. Pt also reports her sister and an additional dtr also assist with her care.  Pt reports attempting to find long term bed "for [name] while now."  CSW discussed possibility of needing to expand SNF search if no long term medicaid beds available in [place].  CSW to complete FL2 initiate SNF search.  Weekday CSW to f/u with any offers.   Assessment/plan status:   Other assessment/ plan:   Information/referral to community resources:    PATIENT'S/FAMILY'S RESPONSE TO PLAN OF CARE: pt and pt's sister agreeable to [place] and [place] SNF search, but not interested in any additional areas. Pt verbalized understanding of placement process and appreciation for CSW assist/support.        [name] [name], MSW, [place] (weekend)

## 2011-07-13 NOTE — Consult Note (Signed)
[facility] Renal Consultation Note  Indication for Consultation:  Management of ESRD/hemodialysis; anemia, hypertension/volume and secondary hyperparathyroidism  HPI: [name] is a 56 y.o. female admitted  07/11/11  By IM TS with abdominal pain and febrile to 103.6.  Recently admitted 05/30/11 after a 13 day stay for treatment of MSSA bacteremia, bacterial endocarditis, and HCAP vs PCP. She was discharged on IV Ancef for MSSA endocarditis (course completed 07/05/11) and po Bactrim for suspected PCP pneumonia (course completed 06/07/11). She is on HAART therapy but says that she forgets taking the meds. She states she skipped HD on Thursday because she was out of town, but Friday the 5th began to have abdominal pain and diarrhea in the AM and came to ER. Admit team is evaluating /treating  for possible C Diff/ HAP/ ? recurrent Endocarditis with ID consulting.   She had hemodialysis last night and tolerated . 3 episodes of loose stools this am.        Past Medical History  Diagnosis Date  . Human immunodeficiency virus (HIV) disease   . Hypertension   . ESRD (end stage renal disease)   . Dialysis patient     pt on dialysis since 2010.  [name] Clostridium difficile infection 04/04/10  . Bacteriuria, asymptomatic 04/04/10    Culture grew VRE sensitive to linesolid   . MGUS (monoclonal gammopathy of unknown significance)   . History of bacteremia     MSSA  . Renal insufficiency   . Anemia     Past Surgical History  Procedure Date  . Av fistula placement   . Insertion of dialysis catheter 12/30/2010    Procedure: INSERTION OF DIALYSIS CATHETER;  Surgeon: Elam Dutch, MD;  Location: [facility];  Service: Vascular;  Laterality: Left;  Insertion of left internal jugular dialysis catheter  . Vascular surgery   . Insertion of dialysis catheter 05/26/2011    Procedure: INSERTION OF DIALYSIS CATHETER;  Surgeon: Conrad [place], MD;  Location: [facility] OR;  Service: Vascular;  Laterality: N/A;   Insertion tunneled dialysis catheter in Right Internal Jugular with 23 cm catheter       Family History  Problem Relation Age of Onset  . Alcohol abuse      family h/o addiction/alcoholism  . Diabetes Cousin     first degree relatives  . Kidney disease Mother   . Kidney disease Maternal Uncle   . Cancer Sister    Social = Lives with Daughter/ no etoh or tobacco    No Known Allergies  Prior to Admission medications   Medication Sig Start Date End Date Taking? Authorizing Provider  acetaminophen (TYLENOL) 650 MG CR tablet Take 650 mg by mouth every 8 (eight) hours as needed. For pain.   Yes Historical Provider, MD  aspirin 81 MG tablet Take 81 mg by mouth daily.     Yes Historical Provider, MD  b complex vitamins tablet Take 1 tablet by mouth daily.     Yes Historical Provider, MD  b complex-vitamin c-folic acid (NEPHRO-VITE) 0.8 MG TABS Take 0.8 mg by mouth daily.     Yes Historical Provider, MD  darbepoetin (ARANESP, ALBUMIN FREE,) 150 MCG/0.3ML SOLN Inject 150 mcg into the skin. Use weekly for Epogen 20000 units. Dialysis nurse to administer.    Yes Historical Provider, MD  darunavir (PREZISTA) 400 MG tablet Take 2 tablets (800 mg total) by mouth daily with breakfast. 12/31/10 12/31/11 Yes Olga Millers, MD  feeding supplement (ENSURE COMPLETE) LIQD Take 237  mLs by mouth 2 (two) times daily between meals. 05/30/11  Yes Na Li, MD  iron sucrose (VENOFER) 20 MG/ML injection 100 mg IV on Tuesday HD(HD RN to dispense)    Yes Historical Provider, MD  lamiVUDine (EPIVIR) 10 MG/ML solution Take 1 teaspoon by mouth every day.    Yes Historical Provider, MD  metoprolol succinate (TOPROL-XL) 12.5 mg TB24 Take 0.5 tablets (12.5 mg total) by mouth at bedtime. 05/30/11  Yes Na Li, MD  Nutritional Supplements (FEEDING SUPPLEMENT, NEPRO CARB STEADY,) LIQD Take 237 mLs by mouth as needed (missed meal during dialysis.). 05/30/11  Yes Na Li, MD  paricalcitol (ZEMPLAR) 5 MCG/ML injection 4 micrograms  IV- dispensed by dialysis nurse.    Yes Historical Provider, MD  ritonavir (NORVIR) 100 MG TABS Take 100 mg by mouth daily with breakfast.    Yes Historical Provider, MD  Stavudine (ZERIT) 1 MG/ML SOLR Take 4 teaspoons(20 ml) by mouth every day.    Yes Historical Provider, MD     Anti-infectives     Start     Dose/Rate Route Frequency Ordered Stop   07/12/11 2200   ritonavir (NORVIR) tablet 100 mg        100 mg Oral Daily with breakfast 07/11/11 1720     07/12/11 2200   metroNIDAZOLE (FLAGYL) tablet 500 mg        500 mg Oral 3 times per day 07/12/11 2131 07/26/11 2159   07/12/11 1600   vancomycin (VANCOCIN) 500 mg in sodium chloride 0.9 % 100 mL IVPB        500 mg 100 mL/hr over 60 Minutes Intravenous  Once 07/12/11 1345 07/12/11 1800   07/12/11 0800   Darunavir Ethanolate (PREZISTA) tablet 800 mg        800 mg Oral Daily with breakfast 07/11/11 1720     07/12/11 0600   piperacillin-tazobactam (ZOSYN) IVPB 2.25 g  Status:  Discontinued        2.25 g 100 mL/hr over 30 Minutes Intravenous 3 times per day 07/11/11 1758 07/12/11 1318   07/11/11 2200   lamiVUDine (EPIVIR) 10 MG/ML solution 50 mg        50 mg Oral Daily 07/11/11 1720     07/11/11 2200   stavudine (ZERIT) capsule 20 mg        20 mg Oral Daily 07/11/11 1720     07/11/11 2200  sulfamethoxazole-trimethoprim (BACTRIM DS) 800-160 MG per tablet 1 tablet       1 tablet Oral Once per day on Mon Wed Fri 07/11/11 1720     07/11/11 1430   vancomycin (VANCOCIN) IVPB 1000 mg/200 mL premix        1,000 mg 200 mL/hr over 60 Minutes Intravenous  Once 07/11/11 1425 07/11/11 1540   07/11/11 1430   piperacillin-tazobactam (ZOSYN) IVPB 3.375 g        3.375 g 12.5 mL/hr over 240 Minutes Intravenous  Once 07/11/11 1425 07/11/11 2036            ROS=  She denies any nausea, vomiting, diarrhea, constipation, chills or fever. She is having diarrhea am of admit and has not had blood in her stool.Denies any pain or dc from perm  cath. Physical Exam: Filed Vitals:   07/13/11 0905  BP: 136/83  Pulse: 76  Temp: 98.5 F (36.9 C)  Resp: 18     General: Alert.OX#, elderly BF NAD, pleasant, appropriate HEENT: [place], MMm Eyes: EOMI Neck:  No jvd Heart:  RRR, 2/6 hsm  at lsb, no rub Lungs: CTA bilaterally Abdomen: BS+=, soft, slightly tender lower abd Extremities: Left pedal edam 2+, right pedal edema 1+ Skin: no ulcers, hyperpigmented areas upper and lower back Neuro: OX3, no acute focal deficits Dialysis Access:  Right ij perm cath nontender  Dialysis Orders: [facility]   [facility] center in am for info . EDW / HD [facility] /  Time / Heparin /. Access / BFR / DFR /    Zemplar / mcg IV/HD Epogen /   Units IV/HD  Venofer  /  Other /  Assessment/Plan 1. Febrile Illness with Abdominal pain- now is Cdif + and blood cx's are negative. ID recommended d/c abx and treat Cdif x 14 d.  2. ESRD [facility] at [facility]- stable, no vol excess.  3. Hypertension/volume  - 1386/83/ Metoprolol xl 12.5mg  hs and hd 4. Anemia  - 9.8 hgb  Aranesp 182mcg weeklywith hd , holding any iron   For now With recent sepsis 5. Metabolic bone disease -  Phos 3.2 no binders for now  89mcg Zemplar with hd/ fu ca / phos in hosp. 6. HIV= per ID 7. 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 Dementia/ Noncompliance  Ernest Haber, PA-C [place] [phone] 07/13/2011, 11:05 AM   Patient seen and examined and agree with assessment and plan as above with additions as indicated.  Kelly Splinter  MD [employer] [phone]    [phone] cell 07/13/2011, 2:05 PM

## 2011-07-13 NOTE — Progress Notes (Signed)
Subjective: Pt feels better and almost at baseline. Denies any any more abdominal pain, nausea vomiting. No chest pain or shortness breath. She is eating good. No fevers overnight.  Objective: Vital signs in last 24 hours: Filed Vitals:   07/13/11 0041 07/13/11 0117 07/13/11 0508 07/13/11 0905  BP: 113/63 96/53 102/57 136/83  Pulse: 61 67 67 76  Temp: 97 F (36.1 C) 97.9 F (36.6 C) 98.5 F (36.9 C) 98.5 F (36.9 C)  TempSrc: Oral Oral Oral Oral  Resp: 12 18 18 18   Height:      Weight: 123 lb 0.3 oz (55.8 kg)     SpO2: 100% 93% 98% 99%   Weight change: 2 lb 3.3 oz (1 kg)  Intake/Output Summary (Last 24 hours) at 07/13/11 1121 Last data filed at 07/13/11 0500  Gross per 24 hour  Intake    635 ml  Output      0 ml  Net    635 ml   Exam HEENT: PERRL, EOMI, no scleral icterus  Cardiac: 3/6 holosystolic murmur a LSB , slight apical heave, no rubs or gallops  Pulm: clear to auscultation bilaterally, no wheezes, rales, or rhonchi  Abd: soft, nontender, nondistended, BS present  Ext: Prominent venous stasis changes, anasarca of L lower extremity, 2+ edema RLE  Neuro: alert and oriented X3, cranial nerves II-XII grossly intact  Skin - Dialysis catheter site at R chest clean, dry and intact without erythema and induration. Multiple hyperpigmented erosions and plagues over back, abdomen, and legs. No Janeway lesions, Osler's nodes or splinter hemorrhages.   Lab Results: Basic Metabolic Panel:  Lab [id number] 07/12/11 0605  NA 135 129*  K 4.4 3.5  CL 101 95*  CO2 26 21  GLUCOSE 96 78  BUN 15 42*  CREATININE 3.38* 7.20*  CALCIUM 7.4* 7.5*  MG -- --  PHOS -- 3.2   Liver Function Tests:  Lab 07/12/11 0605 07/11/11 1035  AST -- 14  ALT -- <5  ALKPHOS -- 144*  BILITOT -- 0.4  PROT -- 7.7  ALBUMIN 1.4* 1.6*    Lab 07/11/11 1035  LIPASE 30  AMYLASE --   CBC:  Lab 07/13/11 0656 07/12/11 0605 07/11/11 1035  WBC 1.5* 1.5* --  NEUTROABS -- 0.8* 0.5*  HGB 9.8*  10.5* --  HCT 30.1* 32.6* --  MCV 96.2 96.7 --  PLT 121* 138* --   CBG:  Lab 07/11/11 1710  GLUCAP 114*    Micro Results: Recent Results (from the past 240 hour(s))  CULTURE, BLOOD (ROUTINE X 2)     Status: Normal (Preliminary result)   Collection Time   07/11/11 10:35 AM      Component Value Range Status Comment   Specimen Description BLOOD ARM LEFT   Final    Special Requests BOTTLES DRAWN AEROBIC AND ANAEROBIC 10CC   Final    Culture  Setup Time 07/11/2011 15:14   Final    Culture     Final    Value:        BLOOD CULTURE RECEIVED NO GROWTH TO DATE CULTURE WILL BE HELD FOR 5 DAYS BEFORE ISSUING A FINAL NEGATIVE REPORT   Report Status PENDING   Incomplete   CULTURE, BLOOD (ROUTINE X 2)     Status: Normal (Preliminary result)   Collection Time   07/11/11  1:29 PM      Component Value Range Status Comment   Specimen Description BLOOD FOREARM LEFT   Final    Special Requests  BOTTLES DRAWN AEROBIC AND ANAEROBIC 5CC   Final    Culture  Setup Time 07/11/2011 22:47   Final    Culture     Final    Value:        BLOOD CULTURE RECEIVED NO GROWTH TO DATE CULTURE WILL BE HELD FOR 5 DAYS BEFORE ISSUING A FINAL NEGATIVE REPORT   Report Status PENDING   Incomplete   MRSA PCR SCREENING     Status: Normal   Collection Time   07/11/11  5:29 PM      Component Value Range Status Comment   MRSA by PCR NEGATIVE  NEGATIVE Final   CLOSTRIDIUM DIFFICILE BY PCR     Status: Abnormal   Collection Time   07/12/11  2:05 PM      Component Value Range Status Comment   C difficile by pcr POSITIVE (*) NEGATIVE Final    Studies/Results: Dg Chest 2 View  07/11/2011  *RADIOLOGY REPORT*  Clinical Data: Abdominal pain.  Diarrhea.  Dialysis patient.  CHEST - 2 VIEW  Comparison: 05/26/2011.  05/19/2011.  Findings: Cardiomegaly is present.  The patchy densities present in the right midlung, likely representing post infectious/inflammatory changes in this patient with recent airspace disease in this region.  Mild  thickening of the fissures is present.  Right IJ dialysis catheter is unchanged compared to 05/26/2011.  Surgical clips are present in the left axilla and right proximal upper arm. No effusion.  IMPRESSION: 1.  Unchanged right IJ dialysis catheter. 2.  Patchy density in the right midlung is most compatible with post infectious/inflammatory changes given the recent consolidation.  Original Report Authenticated By: Dereck Ligas, M.D.   Ct Abdomen Pelvis W Contrast  07/11/2011  *RADIOLOGY REPORT*  Clinical Data: Abdominal pain. Chronic renal failure.  Fever.  CT ABDOMEN AND PELVIS WITH CONTRAST  Technique:  Multidetector CT imaging of the abdomen and pelvis was performed following the standard protocol during bolus administration of intravenous contrast.  Contrast: 35mL OMNIPAQUE IOHEXOL 300 MG/ML  SOLN  Comparison: 09/10/2010 and 05/25/2010  Findings: The patient has diffuse anasarca.  There is a small amount of ascites in the abdomen and pelvis.  The patient has several calcified gallstones.  Gallbladder is not distended.  There is an 11 mm stable lesion in the spleen, felt to be benign.  The pancreas and adrenal glands are normal.  Kidneys are atrophic with a stable 2 cm cyst on the lower pole of the right kidney. Chronic prominence of the uterus.  No dilated bowel.  No free air. No acute osseous abnormality.  IMPRESSION: Diffuse anasarca.  Tiny amount of ascites.  Chronic renal atrophy. Chronic cholelithiasis.  Original Report Authenticated By: Larey Seat, M.D.   Medications: I have reviewed the patient's current medications. Scheduled Meds:    . antiseptic oral rinse  15 mL Mouth Rinse BID  . aspirin  81 mg Oral Daily  . darunavir  800 mg Oral Q breakfast  . feeding supplement  237 mL Oral BID BM  . heparin  5,000 Units Subcutaneous Q8H  . lamiVUDine  50 mg Oral Daily  . metoprolol succinate  12.5 mg Oral QHS  . metroNIDAZOLE  500 mg Oral Q8H  . multivitamin  1 tablet Oral QHS  . ritonavir   100 mg Oral Q breakfast  . sodium chloride  3 mL Intravenous Q12H  . stavudine  20 mg Oral Daily  . sulfamethoxazole-trimethoprim  1 tablet Oral 3 times weekly  . vancomycin  500 mg  Intravenous Once  . DISCONTD: piperacillin-tazobactam (ZOSYN)  IV  2.25 g Intravenous Q8H   Continuous Infusions:  PRN Meds:.sodium chloride, sodium chloride, sodium chloride, acetaminophen, alteplase, feeding supplement (NEPRO CARB STEADY), heparin, heparin, lidocaine, lidocaine-prilocaine, ondansetron (ZOFRAN) IV, ondansetron, pentafluoroprop-tetrafluoroeth, sodium chloride  Assessment/Plan: #Fever  Pt w new onset abdominal pain and febrile to 103.6. CXR showed patchy density in R midlung most c/w postinfectious/inflammatory changes, however, cannot exclude HCAP w recent admission and home dialysis. She had a mild yesterday morning, but no sputum production, is not complaining of SOB and pulmonary exam demonstrates good air movement. She was started empirically on vanc and zosyn in the ED and has since defervesced to 97.4 overnight but is again febrile this morning w T 102 and new complaint of watery diarrhea.   In light of her recent admission for OSSA bacteremia and endocarditis 2/2 to L IJ catheter infection, we are concerned that she main have seeded new R IJ catheter and fever may due to dialysis catheter infection. She remains clinically stable and catheter sight clean. She currently meets 2 minor Duke criteria (predisposing condition, fever) for endocarditis.  - Blood cultures NGTD. C. difficile PCR positive- likely source of fever. Started on Flagyl. - Dr. Megan Salon with ID recommended stopping Zosyn and continuing vancomycin. We'll continue vancomycin until final blood culture negative for further recommendations. - Will continue prophylaxis for PCP (MWF Bactrim)   #HIV/AIDS  Pt w longstanding h/o HIV and AIDS-defining illness and question of recent PCP pneumonia (05/18/11). Last CD4 130 and Viral load  [id number]. Not compliant w HAART at home.  - Repeat CD4. - PCP prophylaxis (Bactrim DS MWF)  - Unclear if this role of continuing her ARTs while in hospital as she does not take her medications regularly at home.  #Abdominal pain  Pt w one day of abdominal pain starting yesterday with no N/V/D. CT scan shows no source of infection, LFTs not elevated. Lactic acid nl (1.2). Pt is pain free this morning, but with watery BM.   - C. difficile PCR positive. Likely source of abdominal pain and fever. - Started on oral Flagyl.  #ESRD on HD  Pt missed HD yesterday, Cr 6.23 on admission. Have spoken w renal today, they are aware of dialysis needs. Pt has had multiple attempts at AVF and recently vascular surgery has determined she is not a candidate for further fistula/graft placement for dialysis. As such she is catheter dependent which is challenging given history of recent bactermia and endocarditis 2/2 infected line. - HD per renal.  #Anemia of Chronic Dz  2/2 ESRD and poorly controlled HIV/AIDS. Hb is 10.5 today, baseline is 8-8.5.  - Stable   #Protein malnutrition  Albumin 1.6, chronically low. Seen by nutrition last admission who recommended regular diet with Ensure supplementation.  -Nutrition following. Appreciate help her  #Nonischemic Cardiomyopathy  Seen by Dr. Ellyn Hack at last admission, suspect non-ischemic CM related to HIV/AIDS. EF 40-45% at last admission. On low-dose metoprolol at home, unclear if she has been taking med. No signs of decompensated heart failure today.  - Continue home metoprolol.   #Thrombocytopenia  Long-standing problem, likely 2/2 HIV/AIDS and ESRD.  - Stable, will monitor plts.  Dispo Pt w multiple attempts at facility placement in last month. Have consulted social work to help with dispo planning.    LOS: 2 days   Shaleigh Laubscher 07/13/2011, 11:21 AM

## 2011-07-13 NOTE — Progress Notes (Signed)
Patient ID: [name], female   DOB: 1955-12-15, 56 y.o.   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 antiseptic oral rinse  15 mL Mouth Rinse BID  . aspirin  81 mg Oral Daily  . darunavir  800 mg Oral Q breakfast  . feeding supplement  237 mL Oral BID BM  . heparin  5,000 Units Subcutaneous Q8H  . lamiVUDine  50 mg Oral Daily  . metoprolol succinate  12.5 mg Oral QHS  . metroNIDAZOLE  500 mg Oral Q8H  . multivitamin  1 tablet Oral QHS  . ritonavir  100 mg Oral Q breakfast  . sodium chloride  3 mL Intravenous Q12H  . stavudine  20 mg Oral Daily  . sulfamethoxazole-trimethoprim  1 tablet Oral 3 times weekly  . vancomycin  500 mg Intravenous Once  . DISCONTD: piperacillin-tazobactam (ZOSYN)  IV  2.25 g Intravenous Q8H    Subjective: She states that she is feeling little bit better. She denies any abdominal pain or diarrhea so far today.  Objective: Temp:  [97 F (36.1 C)-98.6 F (37 C)] 98.5 F (36.9 C) (07/07 0905) Pulse Rate:  [61-79] 76  (07/07 0905) Resp:  [8-20] 18  (07/07 0905) BP: (96-136)/(53-83) 136/83 mmHg (07/07 0905) SpO2:  [93 %-100 %] 99 % (07/07 0905) Weight:  [55.8 kg (123 lb 0.3 oz)-56.1 kg (123 lb 10.9 oz)] 55.8 kg (123 lb 0.3 oz) (07/07 0041)  General: Alert and comfortable Abdomen: Soft and nontender  Lab Results HIV 1 RNA Quant (copies/mL)  Date Value  05/18/2011 [id number]*  02/19/2011 [id number]*  09/12/2010 99900*     CD4 T Cell Abs (cmm)  Date Value  05/18/2011 130*  02/19/2011 220*  09/16/2010 270*   Microbiology: Recent Results (from the past 240 hour(s))  CULTURE, BLOOD (ROUTINE X 2)     Status: Normal (Preliminary  result)   Collection Time   07/11/11 10:35 AM      Component Value Range Status Comment   Specimen Description BLOOD ARM LEFT   Final    Special Requests BOTTLES DRAWN AEROBIC AND ANAEROBIC 10CC   Final    Culture  Setup Time 07/11/2011 15:14   Final    Culture     Final    Value:        BLOOD CULTURE RECEIVED NO GROWTH TO DATE CULTURE WILL BE HELD FOR 5 DAYS BEFORE ISSUING A FINAL NEGATIVE REPORT   Report Status PENDING   Incomplete   CULTURE, BLOOD (ROUTINE X 2)     Status: Normal (Preliminary result)   Collection Time   07/11/11  1:29 PM      Component Value Range Status Comment   Specimen Description BLOOD FOREARM LEFT   Final    Special Requests BOTTLES DRAWN AEROBIC AND ANAEROBIC 5CC   Final    Culture  Setup Time 07/11/2011 22:47   Final    Culture     Final    Value:        BLOOD CULTURE RECEIVED NO GROWTH TO DATE CULTURE WILL BE HELD FOR 5 DAYS BEFORE ISSUING A  FINAL NEGATIVE REPORT   Report Status PENDING   Incomplete   MRSA PCR SCREENING     Status: Normal   Collection Time   07/11/11  5:29 PM      Component Value Range Status Comment   MRSA by PCR NEGATIVE  NEGATIVE Final   CLOSTRIDIUM DIFFICILE BY PCR     Status: Abnormal   Collection Time   07/12/11  2:05 PM      Component Value Range Status Comment   C difficile by pcr POSITIVE (*) NEGATIVE Final     Assessment: Recent fevers do to C. difficile colitis complicating her recent antibiotic therapy for staph bacteremia. I would stop IV vancomycin and continue oral metronidazole for 14 days. She will need followup in our clinic soon to try to improve her adherence with antiretroviral therapy.  Plan: 1. Recommend discontinue vancomycin 2. Continue metronidazole orally for 14 days 3. Followup in ID clinic after discharge 4. Please call if I can be of further assistance  Michel Bickers, MD [facility] for [place] [phone] pager   [phone] cell 07/13/2011, 12:42 PM

## 2011-07-14 LAB — CBC
HCT: 31.4 % — ABNORMAL LOW (ref 36.0–46.0)
Hemoglobin: 10 g/dL — ABNORMAL LOW (ref 12.0–15.0)
MCH: 31.6 pg (ref 26.0–34.0)
MCHC: 31.8 g/dL (ref 30.0–36.0)
MCV: 99.4 fL (ref 78.0–100.0)
Platelets: 136 10*3/uL — ABNORMAL LOW (ref 150–400)
RBC: 3.16 MIL/uL — ABNORMAL LOW (ref 3.87–5.11)
RDW: 15.8 % — ABNORMAL HIGH (ref 11.5–15.5)
WBC: 1.2 10*3/uL — CL (ref 4.0–10.5)

## 2011-07-14 LAB — BASIC METABOLIC PANEL
BUN: 23 mg/dL (ref 6–23)
CO2: 27 mEq/L (ref 19–32)
Calcium: 8.2 mg/dL — ABNORMAL LOW (ref 8.4–10.5)
Chloride: 103 mEq/L (ref 96–112)
Creatinine, Ser: 4.5 mg/dL — ABNORMAL HIGH (ref 0.50–1.10)
GFR calc Af Amer: 12 mL/min — ABNORMAL LOW (ref >90)
GFR calc non Af Amer: 10 mL/min — ABNORMAL LOW (ref >90)
Glucose, Bld: 100 mg/dL — ABNORMAL HIGH (ref 70–99)
Potassium: 4.1 mEq/L (ref 3.5–5.1)
Sodium: 137 mEq/L (ref 135–145)

## 2011-07-14 LAB — T-HELPER CELLS (CD4) COUNT (NOT AT ARMC)
CD4 % Helper T Cell: 24 % — ABNORMAL LOW (ref 33–55)
CD4 T Cell Abs: 80 uL — ABNORMAL LOW (ref 400–2700)

## 2011-07-14 LAB — PHOSPHORUS: Phosphorus: 1.5 mg/dL — ABNORMAL LOW (ref 2.3–4.6)

## 2011-07-14 MED ORDER — WHITE PETROLATUM GEL: Status: DC | PRN

## 2011-07-14 MED ORDER — METRONIDAZOLE 500 MG PO TABS: 500.0000 mg | ORAL_TABLET | Freq: Three times a day (TID) | ORAL | Status: AC

## 2011-07-14 MED ORDER — SULFAMETHOXAZOLE-TMP DS 800-160 MG PO TABS: 1.0000 | ORAL_TABLET | ORAL | Status: AC

## 2011-07-14 NOTE — Progress Notes (Signed)
Discussed discharge instructions with pt. Pt showed no barriers to discharge. IV removed. Tele removed. Assessment unchanged from morning. Pt discharged to home with sister. Home health will be coming out to the house to see the pt, and aid with her medication administration.

## 2011-07-14 NOTE — Progress Notes (Signed)
   CARE MANAGEMENT NOTE 07/14/2011  Patient:  [name],[name]   Account Number:  [id number]  Date Initiated:  07/14/2011  Documentation initiated by:  Lizabeth Leyden  Subjective/Objective Assessment:   Patient admitted with HCAP     Action/Plan:   Progression of care and discharge planning   Anticipated DC Date:  07/14/2011   Anticipated DC Plan:  [place]  CM consult      Choice offered to / List presented to:  C-1 Patient        [place] arranged  HH-1 RN      [place].   Status of service:  In process, will continue to follow Medicare Important Message given?   (If response is "NO", the following Medicare IM given date fields will be blank) Date Medicare IM given:   Date Additional Medicare IM given:    Discharge Disposition:    Per UR Regulation:    If discussed at Long Length of Stay Meetings, dates discussed:    Comments:  07/14/2011  [place], [place] Met with patient and sister to discuss discharge planning for home health services. The patient requests [facility] for RN.  [facility]/ [facility]: referral for RN

## 2011-07-14 NOTE — Progress Notes (Signed)
Pt's WBC is 1.2 today, this is a critical low. MD Posey Pronto notified.

## 2011-07-14 NOTE — Discharge Summary (Signed)
Internal [facility] Discharge Note  Name: [name] MRN: [id number] DOB: Dec 04, 1955 56 y.o.  Date of Admission: 07/11/2011  9:34 AM Date of Discharge: 07/14/2011 Attending Physician: Karren Cobble, MD  Discharge Diagnosis: C. Difficile Infection HIV/AIDS ESRD on hemodialysis  Discharge Medications: Medication List  As of 07/14/2011  2:40 PM   TAKE these medications         acetaminophen 650 MG CR tablet   Commonly known as: TYLENOL   Take 650 mg by mouth every 8 (eight) hours as needed. For pain.      ARANESP (ALBUMIN FREE) 150 MCG/0.3ML Soln   Generic drug: darbepoetin   Inject 150 mcg into the skin. Use weekly for Epogen 20000 units. Dialysis nurse to administer.      aspirin 81 MG tablet   Take 81 mg by mouth daily.      b complex vitamins tablet   Take 1 tablet by mouth daily.      b complex-vitamin c-folic acid 0.8 MG Tabs   Take 0.8 mg by mouth daily.      darunavir 400 MG tablet   Commonly known as: PREZISTA   Take 2 tablets (800 mg total) by mouth daily with breakfast.      feeding supplement Liqd   Take 237 mLs by mouth 2 (two) times daily between meals.      feeding supplement (NEPRO CARB STEADY) Liqd   Take 237 mLs by mouth as needed (missed meal during dialysis.).      lamiVUDine 10 MG/ML solution   Commonly known as: EPIVIR   Take 1 teaspoon by mouth every day.      metoprolol succinate 12.5 mg Tb24   Commonly known as: TOPROL-XL   Take 0.5 tablets (12.5 mg total) by mouth at bedtime.      metroNIDAZOLE 500 MG tablet   Commonly known as: FLAGYL   Take 1 tablet (500 mg total) by mouth every 8 (eight) hours.      NORVIR 100 MG Tabs   Generic drug: ritonavir   Take 100 mg by mouth daily with breakfast.      sulfamethoxazole-trimethoprim 800-160 MG per tablet   Commonly known as: BACTRIM DS   Take 1 tablet by mouth 3 (three) times a week. Please remember to take medication three days per week.      VENOFER 20 MG/ML  injection   Generic drug: iron sucrose   100 mg IV on Tuesday HD(HD RN to dispense)      ZEMPLAR 5 MCG/ML injection   Generic drug: paricalcitol   4 micrograms IV- dispensed by dialysis nurse.      ZERIT 1 MG/ML Solr   Generic drug: Stavudine   Take 4 teaspoons(20 ml) by mouth every day.            Disposition and follow-up:   [name] was discharged from [facility] in Stable condition.  At the hospital follow up visit please address 1) Resolution of diarrhea, pt w C. difficile infection 2) HIV/AIDS - pt had CD4 80 in hospital, VL pending. Discharged on PCP prophylaxis, noncompliant w HAART therapy at home. 3) Please examine dialysis catheter site, pt w previous admission for OSSA bacteremia and endocarditis in 05/2011 suspected seeding from catheter.  Follow-up Appointments: Follow-up Information    Follow up with Carlyle Basques, MD on 07/28/2011. (Please attend your appointment at 3:00pm)    Contact information:   [address]  [place] [phone]         Discharge Orders    Future Appointments: Provider: Department: Dept Phone: Center:   07/28/2011 3:15 PM Carlyle Basques, MD [facility] [phone] [facility]     Future Orders Please Complete By Expires   Diet - low sodium heart healthy      Increase activity slowly      Discharge instructions      Comments:   1. Please take your Flagyl three times a day for 12 more days. Take medicine with breakfast lunch and dinner. It is VERY IMPORTANT that you take this medicine THREE TIMES A DAY. 2. Please come to the Infectious Disease clinic to follow-up with Dr. Baxter Flattery on July 28, 2011 at 3:00pm. 3. Please return to your regular dialysis schedule. 4. Please seek medical attention if you have fever >100.4 degrees, severe abdominal pain, feel light-headed, cannot eat, have chest pain or shortness of breath.   Call MD for:  temperature >100.4      Call MD for:   persistant nausea and vomiting      Call MD for:  severe uncontrolled pain      Call MD for:  difficulty breathing, headache or visual disturbances      Call MD for:  persistant dizziness or light-headedness      Call MD for:  redness, tenderness, or signs of infection (pain, swelling, redness, odor or green/yellow discharge around incision site)         Consultations: Treatment Team:  Sol Blazing, MD  Procedures Performed:  Dg Chest 2 View  07/11/2011  *RADIOLOGY REPORT*  Clinical Data: Abdominal pain.  Diarrhea.  Dialysis patient.  CHEST - 2 VIEW  Comparison: 05/26/2011.  05/19/2011.  Findings: Cardiomegaly is present.  The patchy densities present in the right midlung, likely representing post infectious/inflammatory changes in this patient with recent airspace disease in this region.  Mild thickening of the fissures is present.  Right IJ dialysis catheter is unchanged compared to 05/26/2011.  Surgical clips are present in the left axilla and right proximal upper arm. No effusion.  IMPRESSION: 1.  Unchanged right IJ dialysis catheter. 2.  Patchy density in the right midlung is most compatible with post infectious/inflammatory changes given the recent consolidation.  Original Report Authenticated By: Dereck Ligas, M.D.   Ct Abdomen Pelvis W Contrast  07/11/2011  *RADIOLOGY REPORT*  Clinical Data: Abdominal pain. Chronic renal failure.  Fever.  CT ABDOMEN AND PELVIS WITH CONTRAST  Technique:  Multidetector CT imaging of the abdomen and pelvis was performed following the standard protocol during bolus administration of intravenous contrast.  Contrast: 31mL OMNIPAQUE IOHEXOL 300 MG/ML  SOLN  Comparison: 09/10/2010 and 05/25/2010  Findings: The patient has diffuse anasarca.  There is a small amount of ascites in the abdomen and pelvis.  The patient has several calcified gallstones.  Gallbladder is not distended.  There is an 11 mm stable lesion in the spleen, felt to be benign.  The pancreas and  adrenal glands are normal.  Kidneys are atrophic with a stable 2 cm cyst on the lower pole of the right kidney. Chronic prominence of the uterus.  No dilated bowel.  No free air. No acute osseous abnormality.  IMPRESSION: Diffuse anasarca.  Tiny amount of ascites.  Chronic renal atrophy. Chronic cholelithiasis.  Original Report Authenticated By: Larey Seat, M.D.    Admission HPI:  Ms. Meredith is a 56 yo women w PMH of HIV/AIDS (last CD4 130  and VL [id number] from 05/18/10), ESRD on HD, HTN and recent hospitalization for OSSA bacteremia and endocarditis presenting with one day of abdominal and fever of 103.6. She complains today of abdominal pain that started this morning. She describes the pain as 5/10 cramping pain in her lower central abdomen. She says that the pain started this morning and comes and goes throughout the day. She denies any nausea, vomiting, diarrhea, constipation, chills or fever. She is having regular BM and has not had blood in her stool. Last BM was this morning. She cannot identify any alleviating or aggravating factors of abdominal pain. She took tylenol for the pain without much help.  Otherwise she says she feels fine. She did notice a slight cough this morning but it was not productive. She was discharged from the hospital on 05/30/11 after a 13 day stay for treatment of OSSA bacteremia, bacterial endocarditis, and HCAP vs PCP. She was discharged on IV Ancef for OSSA endocarditis (course completed 07/05/11) and po Bactrim for suspected PCP pneumonia (course completed 06/07/11). She is on HAART therapy but says that she only takes it 2-3 times per week because she forgets.  She denies any new SOB, chest pain, rashes, visual changes numbness, or weakness.   Hospital Course by problem list: #Fever  Pt w new onset abdominal pain and febrile to 103.6 on admission. CXR performed in ED showed patchy density in R midlung most c/w postinfectious/inflammatory changes, however, ED was  concerned for HCAP as pt on dialysis and recent hospitalization. She was started empirically on vanc and zosyn in the ED defervesced to 97.4 overnight. In light of her recent admission for OSSA bacteremia and endocarditis 2/2 to L IJ catheter infection, we were concerned that she main have seeded new R IJ catheter and fever may due to dialysis catheter infection.  The following morning she was again febrile w T 102 and new complaint of watery diarrhea. Stool was C. Difficile +. Vancomycin and Zosyn d'c'd per ID consult recommendation, and pt started on po Flagyl 500mg  TIDx 2 weeks.  C. Difficile infection was determined to be cause of fever. She was afebrile x 48 hrs at the time of discharge.   #Abdominal pain  Pt initially p/w abdominal pain and no diarrhea. CT scan showed no source of infection, LFTs not elevated. Lactic acid nl (1.2). Diarrhea on day 2 of admission prompted stool study for C. Diff, which was positive. Treated with flagyl as above. Abdominal pain remitted and pt reported it much improved at the time of discharge.   #HIV/AIDS  Pt w longstanding h/o HIV and AIDS-defining illness and question of recent PCP pneumonia (05/18/11). Last CD4 130 and Viral load [id number]. Not compliant w HAART at home. CD4 count on this admission was 80, viral load pending. We continued PCP prophylaxis (Bactrim DS MWF) and scheduled f/u in ID clinic on discharge, has appt for 7/22 at 3pm w Dr. Baxter Flattery.   #ESRD on HD  Pt missed HD day prior to admission, Cr 6.23 on admission. In past, pt has had multiple attempts at AVF and recently vascular surgery has determined she is not a candidate for further fistula/graft placement for dialysis. As such she is catheter dependent which is challenging given history of recent bactermia and endocarditis 2/2 infected line. Had HD on Saturday in [place] catheter. Continue home HD schedule on discharge.   #Anemia of Chronic Dz  2/2 ESRD and poorly controlled HIV/AIDS. Hb is 10.0  today, baseline is 8-8.5.  Did  not require transfusions during admission.   #Protein malnutrition  Albumin 1.6, chronically low. Seen by nutrition last admission who recommended regular diet with Ensure supplementation. Nutrition saw her, recommended dysphagia 3 diet and provided nutritional counseling.   #Nonischemic Cardiomyopathy  Seen by Dr. Ellyn Hack during previous admission 05/2011, suspect non-ischemic CM related to HIV/AIDS. EF 40-45% at last admission. On low-dose metoprolol at home, unclear if she has been taking med. No signs of decompensated heart failure during this admission. Continued home metoprolol.   #Thrombocytopenia  Long-standing problem, likely 2/2 HIV/AIDS and ESRD. Stable on admission, not requiring plt transfusion.   Dispo  Pt w multiple attempts at facility placement in last month. Have consulted social work to help with dispo planning. Will d/c to home w social work following for safety and placement as well as case mgmt for medication oversight at home. I spoke with her at length about the importance of medical compliance with TID Flagyl x2 weeks for C Diff, including a discussion of the consequences of not adhering to therapy. She expressed understanding and was able to repeat twice back to me the name of the medication, dose, frequency and duration.    Discharge Vitals:  BP 168/81  Pulse 59  Temp 98 F (36.7 C) (Oral)  Resp 20  Ht 5\' 9"  (1.753 m)  Wt 54.931 kg (121 lb 1.6 oz)  BMI 17.88 kg/m2  SpO2 99%  Discharge Exam HEENT: PERRL, EOMI, no scleral icterus  Cardiac: 3/6 holosystolic murmur a LSB , slight apical heave, no rubs or gallops  Pulm: clear to auscultation bilaterally, no wheezes, rales, or rhonchi  Abd: soft, nontender, nondistended, BS present  Ext: Prominent venous stasis changes, anasarca of L lower extremity, 2+ edema RLE  Neuro: alert and oriented X3, cranial nerves II-XII grossly intact  Skin - Dialysis catheter site at R chest clean, dry  and intact without erythema and induration. Multiple hyperpigmented erosions and plagues over back, abdomen, and legs. No Janeway lesions, Osler's nodes or splinter hemorrhages.   Discharge Labs:  Results for orders placed during the hospital encounter of 07/11/11 (from the past 24 hour(s))  CBC     Status: Abnormal   Collection Time   07/14/11  8:47 AM      Component Value Range   WBC 1.2 (*) 4.0 - 10.5 K/uL   RBC 3.16 (*) 3.87 - 5.11 MIL/uL   Hemoglobin 10.0 (*) 12.0 - 15.0 g/dL   HCT 31.4 (*) 36.0 - 46.0 %   MCV 99.4  78.0 - 100.0 fL   MCH 31.6  26.0 - 34.0 pg   MCHC 31.8  30.0 - 36.0 g/dL   RDW 15.8 (*) 11.5 - 15.5 %   Platelets 136 (*) 150 - 400 K/uL  BASIC METABOLIC PANEL     Status: Abnormal   Collection Time   07/14/11  8:47 AM      Component Value Range   Sodium 137  135 - 145 mEq/L   Potassium 4.1  3.5 - 5.1 mEq/L   Chloride 103  96 - 112 mEq/L   CO2 27  19 - 32 mEq/L   Glucose, Bld 100 (*) 70 - 99 mg/dL   BUN 23  6 - 23 mg/dL   Creatinine, Ser 4.50 (*) 0.50 - 1.10 mg/dL   Calcium 8.2 (*) 8.4 - 10.5 mg/dL   GFR calc non Af Amer 10 (*) >90 mL/min   GFR calc Af Amer 12 (*) >90 mL/min  PHOSPHORUS  Status: Abnormal   Collection Time   07/14/11  8:47 AM      Component Value Range   Phosphorus 1.5 (*) 2.3 - 4.6 mg/dL    Signed: Tonia Brooms 07/14/2011, 2:40 PM   Time Spent on Discharge: 47min

## 2011-07-14 NOTE — Progress Notes (Signed)
Subjective: Pt is talking on the phone when I enter room. Says that she feels better. Episode of diarrhea this am, abdominal pain is resolving and she is eating. I spoke with her at length about the importance of medical compliance with TID Flagyl x2 weeks for C Diff, including a discussion of the consequences of not adhering to therapy. She expressed understanding and was able to repeat twice back to me the name of the medication, dose, frequency and duration. Denies chest pain, SOB, fever, rash, HA or joint pain.   Objective: Vital signs in last 24 hours: Filed Vitals:   07/13/11 1800 07/13/11 2100 07/14/11 0455 07/14/11 0918  BP: 148/86 148/88 131/68 158/85  Pulse: 76 70 61 65  Temp: 98.1 F (36.7 C) 98.3 F (36.8 C) 98 F (36.7 C) 98.1 F (36.7 C)  TempSrc: Oral Oral Oral Oral  Resp: 19 18 20 18   Height:      Weight:  54.931 kg (121 lb 1.6 oz)    SpO2: 99% 95% 98% 95%   Weight change: -1.169 kg (-2 lb 9.3 oz)  Intake/Output Summary (Last 24 hours) at 07/14/11 1133 Last data filed at 07/14/11 0900  Gross per 24 hour  Intake    675 ml  Output      0 ml  Net    675 ml   Exam HEENT: PERRL, EOMI, no scleral icterus  Cardiac: 3/6 holosystolic murmur a LSB , slight apical heave, no rubs or gallops  Pulm: clear to auscultation bilaterally, no wheezes, rales, or rhonchi  Abd: soft, nontender, nondistended, BS present  Ext: Prominent venous stasis changes, anasarca of L lower extremity, 2+ edema RLE  Neuro: alert and oriented X3, cranial nerves II-XII grossly intact  Skin - Dialysis catheter site at R chest clean, dry and intact without erythema and induration. Multiple hyperpigmented erosions and plagues over back, abdomen, and legs. No Janeway lesions, Osler's nodes or splinter hemorrhages.   Lab Results: Basic Metabolic Panel:  Lab [id number] 07/13/11 0656 07/12/11 0605  NA 137 135 --  K 4.1 4.4 --  CL 103 101 --  CO2 27 26 --  GLUCOSE 100* 96 --  BUN 23 15 --    CREATININE 4.50* 3.38* --  CALCIUM 8.2* 7.4* --  MG -- -- --  PHOS 1.5* -- 3.2   Liver Function Tests:  Lab 07/12/11 0605 07/11/11 1035  AST -- 14  ALT -- <5  ALKPHOS -- 144*  BILITOT -- 0.4  PROT -- 7.7  ALBUMIN 1.4* 1.6*    Lab 07/11/11 1035  LIPASE 30  AMYLASE --   CBC:  Lab 07/14/11 0847 07/13/11 0656 07/12/11 0605 07/11/11 1035  WBC 1.2* 1.5* -- --  NEUTROABS -- -- 0.8* 0.5*  HGB 10.0* 9.8* -- --  HCT 31.4* 30.1* -- --  MCV 99.4 96.2 -- --  PLT 136* 121* -- --   CBG:  Lab 07/11/11 1710  GLUCAP 114*    Micro Results: Recent Results (from the past 240 hour(s))  CULTURE, BLOOD (ROUTINE X 2)     Status: Normal (Preliminary result)   Collection Time   07/11/11 10:35 AM      Component Value Range Status Comment   Specimen Description BLOOD ARM LEFT   Final    Special Requests BOTTLES DRAWN AEROBIC AND ANAEROBIC 10CC   Final    Culture  Setup Time 07/11/2011 15:14   Final    Culture     Final    Value:  BLOOD CULTURE RECEIVED NO GROWTH TO DATE CULTURE WILL BE HELD FOR 5 DAYS BEFORE ISSUING A FINAL NEGATIVE REPORT   Report Status PENDING   Incomplete   CULTURE, BLOOD (ROUTINE X 2)     Status: Normal (Preliminary result)   Collection Time   07/11/11  1:29 PM      Component Value Range Status Comment   Specimen Description BLOOD FOREARM LEFT   Final    Special Requests BOTTLES DRAWN AEROBIC AND ANAEROBIC 5CC   Final    Culture  Setup Time 07/11/2011 22:47   Final    Culture     Final    Value:        BLOOD CULTURE RECEIVED NO GROWTH TO DATE CULTURE WILL BE HELD FOR 5 DAYS BEFORE ISSUING A FINAL NEGATIVE REPORT   Report Status PENDING   Incomplete   MRSA PCR SCREENING     Status: Normal   Collection Time   07/11/11  5:29 PM      Component Value Range Status Comment   MRSA by PCR NEGATIVE  NEGATIVE Final   CLOSTRIDIUM DIFFICILE BY PCR     Status: Abnormal   Collection Time   07/12/11  2:05 PM      Component Value Range Status Comment   C difficile by pcr  POSITIVE (*) NEGATIVE Final    Studies/Results: No results found. Medications: I have reviewed the patient's current medications. Scheduled Meds:    . antiseptic oral rinse  15 mL Mouth Rinse BID  . aspirin  81 mg Oral Daily  . darunavir  800 mg Oral Q breakfast  . feeding supplement  237 mL Oral BID BM  . heparin  5,000 Units Subcutaneous Q8H  . lamiVUDine  50 mg Oral Daily  . metoprolol succinate  12.5 mg Oral QHS  . metroNIDAZOLE  500 mg Oral Q8H  . multivitamin  1 tablet Oral QHS  . ritonavir  100 mg Oral Q breakfast  . sodium chloride  3 mL Intravenous Q12H  . stavudine  20 mg Oral Daily  . sulfamethoxazole-trimethoprim  1 tablet Oral 3 times weekly  . DISCONTD: vancomycin  500 mg Intravenous Q T,Th,Sa-HD   Continuous Infusions:  PRN Meds:.sodium chloride, sodium chloride, sodium chloride, acetaminophen, feeding supplement (NEPRO CARB STEADY), heparin, heparin, lidocaine, lidocaine-prilocaine, ondansetron (ZOFRAN) IV, ondansetron, pentafluoroprop-tetrafluoroeth, sodium chloride, white petrolatum  Assessment/Plan: #Fever  Pt w new onset abdominal pain and febrile to 103.6. CXR showed patchy density in R midlung most c/w postinfectious/inflammatory changes, however, cannot exclude HCAP w recent admission and home dialysis. She had a mild yesterday morning, but no sputum production, is not complaining of SOB and pulmonary exam demonstrates good air movement. She was started empirically on vanc and zosyn in the ED and has since defervesced to 97.4 overnight but is again febrile this morning w T 102 and new complaint of watery diarrhea.  In light of her recent admission for OSSA bacteremia and endocarditis 2/2 to L IJ catheter infection, we were concerned that she main have seeded new R IJ catheter and fever may due to dialysis catheter infection. On day 2 of admission she had a watery BM and stool was C Diff +. We believe this to be the source of her fever. Blood cx are NGTD. -Started  on Flagyl TIDx2 weeks. - Per ID, stopped vanc and zosyn -  Continued prophylaxis for PCP (MWF Bactrim)   #HIV/AIDS  Pt w longstanding h/o HIV and AIDS-defining illness and question of recent  PCP pneumonia (05/18/11). Last CD4 130 and Viral load [id number]. Not compliant w HAART at home. CD4 count on this admission was 80, viral load pending. - PCP prophylaxis (Bactrim DS MWF) started on admission.    -F/U in ID clinic on discharge.   #Abdominal pain  Pt initially p/w abdominal pain and no diarrhea. CT scan showed no source of infection, LFTs not elevated. Lactic acid nl (1.2). Pt is pain free this morning, but with watery BM day after admission and C Diff +. - Started on oral Flagyl TID to be completed x2 weeks.  #ESRD on HD  Pt missed HD day prior to admission, Cr 6.23 on admission. Have spoken w renal today, they are aware of dialysis needs. Pt has had multiple attempts at AVF and recently vascular surgery has determined she is not a candidate for further fistula/graft placement for dialysis. As such she is catheter dependent which is challenging given history of recent bactermia and endocarditis 2/2 infected line. - HD inpt on Saturday -Continue home HD schedule on discharge  #Anemia of Chronic Dz  2/2 ESRD and poorly controlled HIV/AIDS. Hb is 10.0 today, baseline is 8-8.5.  - Stable   #Protein malnutrition  Albumin 1.6, chronically low. Seen by nutrition last admission who recommended regular diet with Ensure supplementation.  -Nutrition following. Appreciate help her  #Nonischemic Cardiomyopathy  Seen by Dr. Ellyn Hack at last admission, suspect non-ischemic CM related to HIV/AIDS. EF 40-45% at last admission. On low-dose metoprolol at home, unclear if she has been taking med. No signs of decompensated heart failure today.  - Continue home metoprolol.   #Thrombocytopenia  Long-standing problem, likely 2/2 HIV/AIDS and ESRD.  - Stable, will monitor plts.  Dispo Pt w multiple  attempts at facility placement in last month. Have consulted social work to help with dispo planning. Will d/c to home w social work following for safety and placement as well as case mgmt for medication oversight at home.    LOS: 3 days   Tonia Brooms 07/14/2011, 11:33 AM

## 2011-07-14 NOTE — Progress Notes (Signed)
Internal Medicine Attending  Date: 07/14/2011  Patient name: [name] Medical record number: [id number] Date of birth: 27-Apr-1955 Age: 56 y.o. Gender: female  I saw and evaluated the patient. I reviewed the resident's note by Dr. Sandy Salaam and I agree with the resident's findings and plans as documented in her progress note.  [name] was seen on rounds this morning. Although she continued to have watery diarrhea, her abdominal pain was much improved, her fevers had resolved, and she was eating well. She felt she was able to manage at home despite the continued diarrhea. She was therefore discharged on a two-week course of Flagyl with followup in the infectious disease clinic. The importance of compliance with the Flagyl was stressed.

## 2011-07-14 NOTE — Progress Notes (Signed)
I have seen and examined this patient and agree with the assessment/plan as outlined above by Selinda Flavin NP. HD tomorrow per TTS schedule as she undergoes treatment for C difficile colitis (Recent prolonged ABX course for SBE) Andra Heslin K.,MD 07/14/2011 10:39 AM

## 2011-07-14 NOTE — Progress Notes (Signed)
Subjective:  Feeling better; sitting up in chair; some DOE; appetite good, denies diarrhea at this time   Vital signs in last 24 hours: Filed Vitals:   07/13/11 1800 07/13/11 2100 07/14/11 0455 07/14/11 0918  BP: 148/86 148/88 131/68 158/85  Pulse: 76 70 61 65  Temp: 98.1 F (36.7 C) 98.3 F (36.8 C) 98 F (36.7 C) 98.1 F (36.7 C)  TempSrc: Oral Oral Oral Oral  Resp: 19 18 20 18   Height:      Weight:  54.931 kg (121 lb 1.6 oz)    SpO2: 99% 95% 98% 95%   Weight change: -1.169 kg (-2 lb 9.3 oz)  Intake/Output Summary (Last 24 hours) at 07/14/11 1001 Last data filed at 07/14/11 0900  Gross per 24 hour  Intake    675 ml  Output      0 ml  Net    675 ml   Labs: Basic Metabolic Panel:  Lab [id number] 0656 07/12/11 0605 07/11/11 1035  NA 135 129* 130*  K 4.4 3.5 3.5  CL 101 95* 96  CO2 26 21 23   GLUCOSE 96 78 70  BUN 15 42* 31*  CREATININE 3.38* 7.20* 6.23*  CALCIUM 7.4* 7.5* 7.9*  ALB -- -- --  PHOS -- 3.2 --   Liver Function Tests:  Lab 07/12/11 0605 07/11/11 1035  AST -- 14  ALT -- <5  ALKPHOS -- 144*  BILITOT -- 0.4  PROT -- 7.7  ALBUMIN 1.4* 1.6*    Lab 07/11/11 1035  LIPASE 30  AMYLASE --   No results found for this basename: AMMONIA:3 in the last 168 hours CBC:  Lab 07/13/11 0656 07/12/11 0605 07/11/11 1035  WBC 1.5* 1.5* 1.6*  NEUTROABS -- 0.8* 0.5*  HGB 9.8* 10.5* 10.6*  HCT 30.1* 32.6* 32.8*  MCV 96.2 96.7 97.3  PLT 121* 138* 135*   Cardiac Enzymes: No results found for this basename: CKTOTAL:5,CKMB:5,CKMBINDEX:5,TROPONINI:5 in the last 168 hours CBG:  Lab 07/11/11 1710  GLUCAP 114*    Iron Studies: No results found for this basename: IRON,TIBC,TRANSFERRIN,FERRITIN in the last 72 hours Studies/Results: No results found. Medications:      . antiseptic oral rinse  15 mL Mouth Rinse BID  . aspirin  81 mg Oral Daily  . darunavir  800 mg Oral Q breakfast  . feeding supplement  237 mL Oral BID BM  . heparin  5,000 Units Subcutaneous  Q8H  . lamiVUDine  50 mg Oral Daily  . metoprolol succinate  12.5 mg Oral QHS  . metroNIDAZOLE  500 mg Oral Q8H  . multivitamin  1 tablet Oral QHS  . ritonavir  100 mg Oral Q breakfast  . sodium chloride  3 mL Intravenous Q12H  . stavudine  20 mg Oral Daily  . sulfamethoxazole-trimethoprim  1 tablet Oral 3 times weekly  . DISCONTD: vancomycin  500 mg Intravenous Q T,Th,Sa-HD    I  have reviewed scheduled and prn medications.  Physical Exam: General: comfortable Heart: RRR, 2-3/6 SEM Lungs: few faint rales R base, cleared with cough Abdomen: soft, NT/ND, +BS Extremities: chronic lymphedema with underlying dependent edema L>R  Dialysis Access: RIJ PC   Dialysis Orders: [facility]  EDW / HD [facility] / Time / Heparin /. Access / BFR / DFR /  Zemplar / mcg IV/HD Epogen / Units IV/HD Venofer /  Other: INFO PEND HD unit  Assessment/Plan  1. Febrile Illness with Abdominal pain- now is Cdif + and blood cx's are negative. ID  recommended d/c abx and treat Cdif x 14 d.  2. ESRD TTS at [place]- HD tomorrow; DOE; will challenge eDW.  3. Hypertension/volume - controlled with meds and UF on HD 4. Anemia - Hgb ^ 10.0; same Aranesp 122mcg qwk; no iron with recent sep[sis; follow trend 5. Metabolic bone disease - Phos 3.2, ca 7.4, ; no binders; on Zemplar 24mcg; follow ca/phos 6. HIV= per ID 7. 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 Dementia/ Noncompliance 9. Disposition- per primary  Marni Griffon, FNP-C [facility] Pager [phone]  07/14/2011,10:01 AM  LOS: 3 days

## 2011-07-15 LAB — HIV-1 RNA QUANT-NO REFLEX-BLD
HIV 1 RNA Quant: 90939 copies/mL — ABNORMAL HIGH (ref <20)
HIV-1 RNA Quant, Log: 4.96 {Log} — ABNORMAL HIGH (ref <1.30)

## 2011-07-17 LAB — CULTURE, BLOOD (ROUTINE X 2)
Culture: NO GROWTH
Culture: NO GROWTH

## 2011-07-17 NOTE — Telephone Encounter (Signed)
CSW will explore appropriateness for Assisted Living Facilities

## 2011-07-28 ENCOUNTER — Encounter: Payer: Self-pay | Admitting: Internal Medicine

## 2011-07-28 ENCOUNTER — Ambulatory Visit (INDEPENDENT_AMBULATORY_CARE_PROVIDER_SITE_OTHER): Payer: Medicaid Other | Admitting: Internal Medicine

## 2011-07-28 VITALS — BP 158/86 | HR 87 | Temp 98.6°F | Wt 122.0 lb

## 2011-07-28 DIAGNOSIS — Z21 Asymptomatic human immunodeficiency virus [HIV] infection status: Secondary | ICD-10-CM

## 2011-07-28 DIAGNOSIS — B20 Human immunodeficiency virus [HIV] disease: Secondary | ICD-10-CM

## 2011-07-28 LAB — CBC WITH DIFFERENTIAL/PLATELET
Basophils Absolute: 0 10*3/uL (ref 0.0–0.1)
Basophils Relative: 1 % (ref 0–1)
Eosinophils Absolute: 0 10*3/uL (ref 0.0–0.7)
Eosinophils Relative: 2 % (ref 0–5)
HCT: 30.4 % — ABNORMAL LOW (ref 36.0–46.0)
Hemoglobin: 10.1 g/dL — ABNORMAL LOW (ref 12.0–15.0)
Lymphocytes Relative: 52 % — ABNORMAL HIGH (ref 12–46)
Lymphs Abs: 0.9 10*3/uL (ref 0.7–4.0)
MCH: 32 pg (ref 26.0–34.0)
MCHC: 33.2 g/dL (ref 30.0–36.0)
MCV: 96.2 fL (ref 78.0–100.0)
Monocytes Absolute: 0.3 10*3/uL (ref 0.1–1.0)
Monocytes Relative: 19 % — ABNORMAL HIGH (ref 3–12)
Neutro Abs: 0.5 10*3/uL — ABNORMAL LOW (ref 1.7–7.7)
Neutrophils Relative %: 26 % — ABNORMAL LOW (ref 43–77)
Platelets: 208 10*3/uL (ref 150–400)
RBC: 3.16 MIL/uL — ABNORMAL LOW (ref 3.87–5.11)
RDW: 16 % — ABNORMAL HIGH (ref 11.5–15.5)
WBC: 1.7 10*3/uL — ABNORMAL LOW (ref 4.0–10.5)

## 2011-07-30 LAB — HIV-1 RNA ULTRAQUANT REFLEX TO GENTYP+
HIV 1 RNA Quant: 4783 copies/mL — ABNORMAL HIGH (ref <20)
HIV-1 RNA Quant, Log: 3.68 {Log} — ABNORMAL HIGH (ref <1.30)

## 2011-07-30 LAB — T-HELPER CELL (CD4) - (RCID CLINIC ONLY)
CD4 % Helper T Cell: 26 % — ABNORMAL LOW (ref 33–55)
CD4 T Cell Abs: 210 uL — ABNORMAL LOW (ref 400–2700)

## 2011-07-31 LAB — HIV-1 GENOTYPR PLUS

## 2011-08-01 ENCOUNTER — Telehealth: Payer: Self-pay | Admitting: *Deleted

## 2011-08-01 NOTE — Telephone Encounter (Signed)
Melody from [facility] called clinic to get an order for home PT. Thought pt would benefit from PT. Hilda Blades Sheneika Walstad RN 08/01/11 2:15PM

## 2011-08-01 NOTE — Telephone Encounter (Signed)
You guys recently discharged this patient. Do you think she needs home PT?

## 2011-08-02 NOTE — Progress Notes (Signed)
HIV CLINIC   RFV: follow up, also had recent hospitalization Subjective:    Patient ID: [name], female    DOB: [birth year], 56 y.o.   MRN: [id number]  HPI Ms. Zelna is a 56yo F with HIV/ESRD on HD on t/th/sat. CD 4 count of 80/ VL 90,939 currently on lamivudine/stavudine with boosted darunavir. States that she has been taking her medications abit more regularly now. She uses a pillbox to dispense her medications. Current Outpatient Prescriptions on File Prior to Visit  Medication Sig Dispense Refill  . acetaminophen (TYLENOL) 650 MG CR tablet Take 650 mg by mouth every 8 (eight) hours as needed. For pain.      [name] aspirin 81 MG tablet Take 81 mg by mouth daily.        [name] b complex vitamins tablet Take 1 tablet by mouth daily.        [name] b complex-vitamin c-folic acid (NEPHRO-VITE) 0.8 MG TABS Take 0.8 mg by mouth daily.        . darbepoetin (ARANESP, ALBUMIN FREE,) 150 MCG/0.3ML SOLN Inject 150 mcg into the skin. Use weekly for Epogen 20000 units. Dialysis nurse to administer.       . darunavir (PREZISTA) 400 MG tablet Take 2 tablets (800 mg total) by mouth daily with breakfast.  60 tablet  1  . feeding supplement (ENSURE COMPLETE) LIQD Take 237 mLs by mouth 2 (two) times daily between meals.  60 Bottle  11  . iron sucrose (VENOFER) 20 MG/ML injection 100 mg IV on Tuesday HD(HD RN to dispense)       . lamiVUDine (EPIVIR) 10 MG/ML solution Take 1 teaspoon by mouth every day.       . metoprolol succinate (TOPROL-XL) 12.5 mg TB24 Take 0.5 tablets (12.5 mg total) by mouth at bedtime.  30 tablet  11  . Nutritional Supplements (FEEDING SUPPLEMENT, NEPRO CARB STEADY,) LIQD Take 237 mLs by mouth as needed (missed meal during dialysis.).  30 Can  11  . paricalcitol (ZEMPLAR) 5 MCG/ML injection 4 micrograms IV- dispensed by dialysis nurse.       . ritonavir (NORVIR) 100 MG TABS Take 100 mg by mouth daily with breakfast.       . Stavudine (ZERIT) 1 MG/ML SOLR Take 4 teaspoons(20 ml) by mouth every day.         Active Ambulatory Problems    Diagnosis Date Noted  . HIV INFECTION 03/19/2007  . ANEMIA OF CHRONIC DISEASE 03/19/2007  . Pancytopenia 05/02/2009  . HYPERTENSION, MILD 06/04/2009  . CONGESTIVE CARDIOMYOPATHY 03/19/2007  . ESRD (end stage renal disease) 03/19/2007  . LEG EDEMA, CHRONIC 04/15/2007  . Noncompliance 05/18/2011  . Pneumonia 05/18/2011  . Protein calorie malnutrition 05/22/2011  . Endocarditis by TTE 05/21/11 05/27/2011  . C. difficile colitis 05/27/2011  . Cardiomyopathy EF 40-45% 2D this admission 05/27/2011  . Abnormal EKG, chronic AS TWI 05/27/2011  . NSVT (nonsustained ventricular tachycardia) 05/27/2011  . 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 HCAP (healthcare-associated pneumonia) 07/11/2011  . Fever 07/12/2011  . Abdominal pain 07/12/2011  . Diarrhea 07/12/2011  . Memory loss 07/12/2011   Resolved Ambulatory Problems    Diagnosis Date Noted  . Septic shock 12/24/2010  . Pneumonia 12/24/2010  . End-stage renal disease 12/29/2010  . End stage renal disease 01/22/2011  . Severe sepsis without septic shock 05/18/2011   Past Medical History  Diagnosis Date  . Hypertension   . Dialysis patient   . Clostridium difficile infection 04/04/10  .  Bacteriuria, asymptomatic 04/04/10  . MGUS (monoclonal gammopathy of unknown significance)   . History of bacteremia   . Renal insufficiency   . Anemia     Review of Systems 10 point ROS, reviewed. All negative except she reports having loose BM x 2 per day. No abdominal cramping.    Objective:   Physical Exam BP 158/86  Pulse 87  Temp 98.6 F (37 C) (Oral)  Wt 122 lb (55.339 kg) Physical Exam  Constitutional:  oriented to person, place, and time.  appears well-developed and well-nourished. No distress.  HENT:  Mouth/Throat: Oropharynx is clear and moist. No oropharyngeal exudate.  Cardiovascular: Normal rate, regular rhythm and normal heart sounds. Exam reveals no gallop and no friction rub.  No murmur heard.  Pulmonary/Chest: Effort normal and breath sounds normal. No respiratory distress.  no wheezes.   Lymphadenopathy:  cervical adenopathy.  Skin: Skin is warm and dry. No rash noted. No erythema.     Assessment & Plan:  HIV= will check labs, cd 4 count and viral load; will check genotype since concern that she has ongoing viremia due to resistance pattern   Neutropenia = appears to be a long standing issue. Her anc today is 500 but gets as high as 800. Will ask patient to contact us to get immediate medical attention if she has fever, and assess for neutropenic fever.  oi proph = once cd 4 count > 200. Can stop prophylaxis

## 2011-08-04 NOTE — Telephone Encounter (Signed)
She would benefit from home PT. Thank you.

## 2011-08-04 NOTE — Telephone Encounter (Signed)
Since I do not know this patient at all and have no idea why she needs PT, would you (or someone who knows her) make this referral?

## 2011-08-04 NOTE — Telephone Encounter (Signed)
I talked with Dr Eppie Gibson and he gave the VO for evaluation of need for home Physical Therapy for pt.  Melody with [facility] informed [phone])

## 2011-08-07 ENCOUNTER — Encounter: Payer: Self-pay | Admitting: *Deleted

## 2011-08-11 ENCOUNTER — Encounter: Payer: Self-pay | Admitting: Internal Medicine

## 2011-08-11 ENCOUNTER — Encounter: Payer: Self-pay | Admitting: *Deleted

## 2011-08-13 ENCOUNTER — Telehealth: Payer: Self-pay | Admitting: Licensed Clinical Social Worker

## 2011-08-13 NOTE — Telephone Encounter (Signed)
[name] has had an hospital admission since CSW has attempted to find placement.  Hospital CSW attempted placement during hospitalization.  CSW has sent referrals to [facility], [facility], [facility], [facility].  [facility] referral faxed today.  As well as, [facility] and [facility].  All facilities have declined to accept [name]. This will be the last referral this CSW will fax.  Will implore family to view facilities and CSW will assist with any documentation necessary. Awaiting response from [facility].

## 2011-08-14 NOTE — Telephone Encounter (Signed)
Received call from [facility], pt possible admission to their facility.  Mardene Celeste, admissions, calling sister today.  Facility had concern regarding dialysis, facility only transports to a dialysis center in [facility].  CSW re-faxed hospital notes to facility per request.

## 2011-08-14 NOTE — Telephone Encounter (Signed)
Facility in need of outpatient notes within 5 days of admission to their facility.  CSW requesting appt for pt next week.  Inpt CSW will forward [facility]ARR number to [facility].

## 2011-08-19 ENCOUNTER — Encounter: Payer: Self-pay | Admitting: Internal Medicine

## 2011-08-21 ENCOUNTER — Inpatient Hospital Stay (HOSPITAL_COMMUNITY)
Admission: EM | Admit: 2011-08-21 | Discharge: 2011-09-02 | DRG: 314 | Disposition: A | Discharge status: Home / Self Care | Payer: Medicaid Other | Attending: Internal Medicine | Admitting: Internal Medicine

## 2011-08-21 DIAGNOSIS — D696 Thrombocytopenia, unspecified: Secondary | ICD-10-CM | POA: Diagnosis present

## 2011-08-21 DIAGNOSIS — I428 Other cardiomyopathies: Secondary | ICD-10-CM | POA: Diagnosis present

## 2011-08-21 DIAGNOSIS — R109 Unspecified abdominal pain: Secondary | ICD-10-CM

## 2011-08-21 DIAGNOSIS — R5081 Fever presenting with conditions classified elsewhere: Secondary | ICD-10-CM | POA: Diagnosis present

## 2011-08-21 DIAGNOSIS — I429 Cardiomyopathy, unspecified: Secondary | ICD-10-CM | POA: Diagnosis present

## 2011-08-21 DIAGNOSIS — T827XXA Infection and inflammatory reaction due to other cardiac and vascular devices, implants and grafts, initial encounter: Principal | ICD-10-CM | POA: Diagnosis present

## 2011-08-21 DIAGNOSIS — I38 Endocarditis, valve unspecified: Secondary | ICD-10-CM | POA: Diagnosis present

## 2011-08-21 DIAGNOSIS — R64 Cachexia: Secondary | ICD-10-CM | POA: Diagnosis present

## 2011-08-21 DIAGNOSIS — R5381 Other malaise: Secondary | ICD-10-CM | POA: Diagnosis present

## 2011-08-21 DIAGNOSIS — D638 Anemia in other chronic diseases classified elsewhere: Secondary | ICD-10-CM | POA: Diagnosis present

## 2011-08-21 DIAGNOSIS — E875 Hyperkalemia: Secondary | ICD-10-CM | POA: Diagnosis present

## 2011-08-21 DIAGNOSIS — E46 Unspecified protein-calorie malnutrition: Secondary | ICD-10-CM | POA: Diagnosis present

## 2011-08-21 DIAGNOSIS — R7881 Bacteremia: Secondary | ICD-10-CM

## 2011-08-21 DIAGNOSIS — B952 Enterococcus as the cause of diseases classified elsewhere: Secondary | ICD-10-CM | POA: Diagnosis present

## 2011-08-21 DIAGNOSIS — I959 Hypotension, unspecified: Secondary | ICD-10-CM | POA: Diagnosis present

## 2011-08-21 DIAGNOSIS — Z7982 Long term (current) use of aspirin: Secondary | ICD-10-CM

## 2011-08-21 DIAGNOSIS — I498 Other specified cardiac arrhythmias: Secondary | ICD-10-CM | POA: Diagnosis present

## 2011-08-21 DIAGNOSIS — D61818 Other pancytopenia: Secondary | ICD-10-CM | POA: Diagnosis present

## 2011-08-21 DIAGNOSIS — B20 Human immunodeficiency virus [HIV] disease: Secondary | ICD-10-CM | POA: Diagnosis present

## 2011-08-21 DIAGNOSIS — N186 End stage renal disease: Secondary | ICD-10-CM | POA: Diagnosis present

## 2011-08-21 DIAGNOSIS — IMO0002 Reserved for concepts with insufficient information to code with codable children: Secondary | ICD-10-CM

## 2011-08-21 DIAGNOSIS — E8889 Other specified metabolic disorders: Secondary | ICD-10-CM | POA: Diagnosis present

## 2011-08-21 DIAGNOSIS — Y841 Kidney dialysis as the cause of abnormal reaction of the patient, or of later complication, without mention of misadventure at the time of the procedure: Secondary | ICD-10-CM | POA: Diagnosis present

## 2011-08-21 DIAGNOSIS — A0472 Enterocolitis due to Clostridium difficile, not specified as recurrent: Secondary | ICD-10-CM | POA: Diagnosis present

## 2011-08-21 DIAGNOSIS — D709 Neutropenia, unspecified: Secondary | ICD-10-CM | POA: Diagnosis present

## 2011-08-21 DIAGNOSIS — Z79899 Other long term (current) drug therapy: Secondary | ICD-10-CM

## 2011-08-21 DIAGNOSIS — Z1639 Resistance to other specified antimicrobial drug: Secondary | ICD-10-CM | POA: Diagnosis present

## 2011-08-21 DIAGNOSIS — R509 Fever, unspecified: Secondary | ICD-10-CM

## 2011-08-21 MED ORDER — ACETAMINOPHEN 325 MG PO TABS
975.0000 mg | ORAL_TABLET | Freq: Once | ORAL | Status: AC
  Administered 2011-08-21: 975 mg via ORAL
  Filled 2011-08-21 (×2): qty 3

## 2011-08-21 MED ORDER — ACETAMINOPHEN 500 MG PO TABS: 1000.0000 mg | ORAL_TABLET | Freq: Once | ORAL | Status: DC

## 2011-08-21 NOTE — ED Notes (Signed)
Pt report mid abdominal pain onset at 2000 this evening denies N/V/D but no BM x2 days Last meal 1 day ago at lunch. Pt very warm to touch and has areporte tempanic temp 103.2. Pt alert x3 verbally approp to questions.

## 2011-08-21 NOTE — ED Provider Notes (Signed)
History     CSN: [id number]  Arrival date & time 08/21/11  2303   First MD Initiated Contact with Patient 08/21/11 2319      Chief Complaint  Patient presents with  . Fever  . Abdominal Pain    (Consider location/radiation/quality/duration/timing/severity/associated sxs/prior treatment) Patient is a 56 y.o. female presenting with fever and abdominal pain. The history is provided by the patient.  Fever Primary symptoms of the febrile illness include fever and abdominal pain. Primary symptoms do not include headaches, shortness of breath, vomiting, diarrhea or rash.  Abdominal Pain The primary symptoms of the illness include abdominal pain and fever. The primary symptoms of the illness do not include shortness of breath, vomiting or diarrhea.  Symptoms associated with the illness do not include chills, constipation or back pain.  pt w hx hiv, esrd/hd c/o mid abd pain for past day, crampy/dull, constant, mild-mod, no specific exacerbating or alleviating factors. No radiation. No back or flank pain. Having normal bms. Denies diarrhea. No abd distension or nv. Denies prior abd surgery. No hx diverticulitis. Denies hx pancreatitis or pud. Has esrd, hd t/th/sat, did not go to hd today but went 2 days ago, having normal run then. States makes no urine at baseline. No vaginal discharge or bleeding. No change in bms.  Has right chest hd cath, pt unsure when placed. Denies cough or uri symptoms. No headache or neck pain. No rash. No known ill contact.s     Past Medical History  Diagnosis Date  . Human immunodeficiency virus (HIV) disease   . Hypertension   . ESRD (end stage renal disease)   . Dialysis patient     pt on dialysis since 2010.  [name] Clostridium difficile infection 04/04/10  . Bacteriuria, asymptomatic 04/04/10    Culture grew VRE sensitive to linesolid   . MGUS (monoclonal gammopathy of unknown significance)   . History of bacteremia     MSSA  . Renal insufficiency   . Anemia       Past Surgical History  Procedure Date  . Av fistula placement   . Insertion of dialysis catheter 12/30/2010    Procedure: INSERTION OF DIALYSIS CATHETER;  Surgeon: Elam Dutch, MD;  Location: [place] [facility];  Service: Vascular;  Laterality: Left;  Insertion of left internal jugular dialysis catheter  . Vascular surgery   . Insertion of dialysis catheter 05/26/2011    Procedure: INSERTION OF DIALYSIS CATHETER;  Surgeon: Conrad Eliz[place], MD;  Location: [facility] OR;  Service: Vascular;  Laterality: N/A;  Insertion tunneled dialysis catheter in Right Internal Jugular with 23 cm catheter     Family History  Problem Relation Age of Onset  . Alcohol abuse      family h/o addiction/alcoholism  . Diabetes Cousin     first degree relatives  . Kidney disease Mother   . Kidney disease Maternal Uncle   . Cancer Sister     History  Substance Use Topics  . Smoking status: Never Smoker   . Smokeless tobacco: Never Used  . Alcohol Use: No    OB History    Grav Para Term Preterm Abortions TAB SAB Ect Mult Living                  Review of Systems  Constitutional: Positive for fever. Negative for chills.  HENT: Negative for sore throat and neck pain.   Eyes: Negative for discharge and redness.  Respiratory: Negative for shortness of breath.   Cardiovascular: Negative for  chest pain.  Gastrointestinal: Positive for abdominal pain. Negative for vomiting, diarrhea and constipation.  Genitourinary: Negative for flank pain.  Musculoskeletal: Negative for back pain.  Skin: Negative for rash.  Neurological: Negative for headaches.  Hematological: Does not bruise/bleed easily.  Psychiatric/Behavioral: Negative for confusion.    Allergies  Review of patient's allergies indicates no known allergies.  Home Medications   Current Outpatient Rx  Name Route Sig Dispense Refill  . ACETAMINOPHEN ER 650 MG PO TBCR Oral Take 650 mg by mouth every 8 (eight) hours as needed. For pain.    . ASPIRIN 81 MG PO  TABS Oral Take 81 mg by mouth daily.      . B COMPLEX PO TABS Oral Take 1 tablet by mouth daily.      [name] NEPHRO-VITE 0.8 MG PO TABS Oral Take 0.8 mg by mouth daily.      [name] DARBEPOETIN ALFA-POLYSORBATE 150 MCG/0.3ML IJ SOLN Subcutaneous Inject 150 mcg into the skin. Use weekly for Epogen 20000 units. Dialysis nurse to administer.     [name] DARUNAVIR ETHANOLATE 400 MG PO TABS Oral Take 2 tablets (800 mg total) by mouth daily with breakfast. 60 tablet 1  . ENSURE COMPLETE PO LIQD Oral Take 237 mLs by mouth 2 (two) times daily between meals. 60 Bottle 11  . IRON SUCROSE 20 MG/ML IV SOLN  100 mg IV on Tuesday HD(HD RN to dispense)     . LAMIVUDINE 10 MG/ML PO SOLN  Take 1 teaspoon by mouth every day.     [name] METOPROLOL SUCCINATE 12.5 MG HALF TABLET Oral Take 0.5 tablets (12.5 mg total) by mouth at bedtime. 30 tablet 11  . NEPRO/CARB STEADY PO LIQD Oral Take 237 mLs by mouth as needed (missed meal during dialysis.). 30 Can 11  . PARICALCITOL 5 MCG/ML IV SOLN  4 micrograms IV- dispensed by dialysis nurse.     [name] RITONAVIR 100 MG PO TABS Oral Take 100 mg by mouth daily with breakfast.     . STAVUDINE 1 MG/ML PO SOLR  Take 4 teaspoons(20 ml) by mouth every day.       BP 105/64  Pulse 80  Temp 102.9 F (39.4 C) (Oral)  Resp 18  SpO2 100%  Physical Exam  Nursing note and vitals reviewed. Constitutional: She is oriented to person, place, and time. She appears well-developed and well-nourished. No distress.  HENT:  Nose: Nose normal.  Mouth/Throat: Oropharynx is clear and moist.  Eyes: Conjunctivae are normal. No scleral icterus.  Neck: Neck supple. No tracheal deviation present.       No stiffness or rigidity  Cardiovascular: Normal rate, regular rhythm, normal heart sounds and intact distal pulses.   No murmur heard. Pulmonary/Chest: Effort normal and breath sounds normal. No respiratory distress.       Hd cath right chest. Site non tender, no erythema or purulent drainage.   Abdominal: Soft. Normal  appearance and bowel sounds are normal. She exhibits no distension and no mass. There is no tenderness. There is no rebound and no guarding.  Genitourinary:       No cva tenderness  Musculoskeletal: She exhibits no tenderness.  Neurological: She is alert and oriented to person, place, and time.  Skin: Skin is warm and dry. No rash noted. She is not diaphoretic.  Psychiatric: She has a normal mood and affect.    ED Course  Procedures (including critical care time)   Labs Reviewed  CBC WITH DIFFERENTIAL  COMPREHENSIVE METABOLIC PANEL  LACTIC  ACID, PLASMA  CULTURE, BLOOD (ROUTINE X 2)  CULTURE, BLOOD (ROUTINE X 2)   Results for orders placed during the hospital encounter of 08/21/11  CBC WITH DIFFERENTIAL      Component Value Range   WBC 2.8 (*) 4.0 - 10.5 K/uL   RBC 3.59 (*) 3.87 - 5.11 MIL/uL   Hemoglobin 11.2 (*) 12.0 - 15.0 g/dL   HCT 35.6 (*) 36.0 - 46.0 %   MCV 99.2  78.0 - 100.0 fL   MCH 31.2  26.0 - 34.0 pg   MCHC 31.5  30.0 - 36.0 g/dL   RDW 15.8 (*) 11.5 - 15.5 %   Platelets 146 (*) 150 - 400 K/uL   Neutrophils Relative 58  43 - 77 %   Neutro Abs 1.6 (*) 1.7 - 7.7 K/uL   Lymphocytes Relative 29  12 - 46 %   Lymphs Abs 0.8  0.7 - 4.0 K/uL   Monocytes Relative 12  3 - 12 %   Monocytes Absolute 0.3  0.1 - 1.0 K/uL   Eosinophils Relative 0  0 - 5 %   Eosinophils Absolute 0.0  0.0 - 0.7 K/uL   Basophils Relative 1  0 - 1 %   Basophils Absolute 0.0  0.0 - 0.1 K/uL  COMPREHENSIVE METABOLIC PANEL      Component Value Range   Sodium 137  135 - 145 mEq/L   Potassium 3.0 (*) 3.5 - 5.1 mEq/L   Chloride 102  96 - 112 mEq/L   CO2 27  19 - 32 mEq/L   Glucose, Bld 89  70 - 99 mg/dL   BUN 15  6 - 23 mg/dL   Creatinine, Ser 3.69 (*) 0.50 - 1.10 mg/dL   Calcium 7.7 (*) 8.4 - 10.5 mg/dL   Total Protein 8.9 (*) 6.0 - 8.3 g/dL   Albumin 2.0 (*) 3.5 - 5.2 g/dL   AST 26  0 - 37 U/L   ALT 5  0 - 35 U/L   Alkaline Phosphatase 120 (*) 39 - 117 U/L   Total Bilirubin 0.3  0.3 - 1.2  mg/dL   GFR calc non Af Amer 13 (*) >90 mL/min   GFR calc Af Amer 15 (*) >90 mL/min  LACTIC ACID, PLASMA      Component Value Range   Lactic Acid, Venous 1.8  0.5 - 2.2 mmol/L  LIPASE, BLOOD      Component Value Range   Lipase 40  11 - 59 U/L   Dg Abd Acute W/chest  08/22/2011  *RADIOLOGY REPORT*  Clinical Data: Abdominal pain  ACUTE ABDOMEN SERIES (ABDOMEN 2 VIEW & CHEST 1 VIEW)  Comparison: 07/11/2011 and earlier studies  Findings: Right IJ tunneled hemodialysis catheter extends to the proximal right atrium.  Stable mild cardiomegaly.  Vascular clips in the left axilla.  Lungs are clear.  No effusion.  No free air. Nonobstructive bowel gas pattern.  Left pelvic phlebolith. Regional bones unremarkable.  IMPRESSION:  1.  Stable mild cardiomegaly. 2.  Normal bowel gas pattern.  No free air.  Original Report Authenticated By: Trecia Rogers, M.D.       MDM  Iv ns. Labs.  Xrays.  Morphine iv for pain. zofran for nausea. k low however pt w esrd therefore no additional k replacement given.   Reviewed prior charts. Admission once month ago for mid abd pain and fever 103 - had ct done then neg acute, and was dx w c diff colitis.  Reviewed nursing  notes and prior charts for additional history.   Discussed with outpatient clinic resident on call, will admit.        Mirna Mires, MD 08/22/11 [phone]

## 2011-08-22 ENCOUNTER — Telehealth: Payer: Self-pay | Admitting: *Deleted

## 2011-08-22 ENCOUNTER — Emergency Department (HOSPITAL_COMMUNITY): Payer: Medicaid Other

## 2011-08-22 ENCOUNTER — Encounter (HOSPITAL_COMMUNITY): Payer: Self-pay

## 2011-08-22 ENCOUNTER — Encounter: Payer: Medicaid Other | Admitting: Internal Medicine

## 2011-08-22 DIAGNOSIS — A0472 Enterocolitis due to Clostridium difficile, not specified as recurrent: Secondary | ICD-10-CM

## 2011-08-22 DIAGNOSIS — B20 Human immunodeficiency virus [HIV] disease: Secondary | ICD-10-CM

## 2011-08-22 DIAGNOSIS — R7881 Bacteremia: Secondary | ICD-10-CM

## 2011-08-22 DIAGNOSIS — I498 Other specified cardiac arrhythmias: Secondary | ICD-10-CM

## 2011-08-22 DIAGNOSIS — I959 Hypotension, unspecified: Secondary | ICD-10-CM | POA: Diagnosis present

## 2011-08-22 LAB — COMPREHENSIVE METABOLIC PANEL
ALT: 5 U/L (ref 0–35)
AST: 26 U/L (ref 0–37)
Albumin: 2 g/dL — ABNORMAL LOW (ref 3.5–5.2)
Alkaline Phosphatase: 120 U/L — ABNORMAL HIGH (ref 39–117)
BUN: 15 mg/dL (ref 6–23)
CO2: 27 mEq/L (ref 19–32)
Calcium: 7.7 mg/dL — ABNORMAL LOW (ref 8.4–10.5)
Chloride: 102 mEq/L (ref 96–112)
Creatinine, Ser: 3.69 mg/dL — ABNORMAL HIGH (ref 0.50–1.10)
GFR calc Af Amer: 15 mL/min — ABNORMAL LOW (ref >90)
GFR calc non Af Amer: 13 mL/min — ABNORMAL LOW (ref >90)
Glucose, Bld: 89 mg/dL (ref 70–99)
Potassium: 3 mEq/L — ABNORMAL LOW (ref 3.5–5.1)
Sodium: 137 mEq/L (ref 135–145)
Total Bilirubin: 0.3 mg/dL (ref 0.3–1.2)
Total Protein: 8.9 g/dL — ABNORMAL HIGH (ref 6.0–8.3)

## 2011-08-22 LAB — LACTIC ACID, PLASMA: Lactic Acid, Venous: 1.8 mmol/L (ref 0.5–2.2)

## 2011-08-22 LAB — CBC WITH DIFFERENTIAL/PLATELET
Basophils Absolute: 0 10*3/uL (ref 0.0–0.1)
Basophils Relative: 1 % (ref 0–1)
Eosinophils Absolute: 0 10*3/uL (ref 0.0–0.7)
Eosinophils Relative: 0 % (ref 0–5)
HCT: 35.6 % — ABNORMAL LOW (ref 36.0–46.0)
Hemoglobin: 11.2 g/dL — ABNORMAL LOW (ref 12.0–15.0)
Lymphocytes Relative: 29 % (ref 12–46)
Lymphs Abs: 0.8 10*3/uL (ref 0.7–4.0)
MCH: 31.2 pg (ref 26.0–34.0)
MCHC: 31.5 g/dL (ref 30.0–36.0)
MCV: 99.2 fL (ref 78.0–100.0)
Monocytes Absolute: 0.3 10*3/uL (ref 0.1–1.0)
Monocytes Relative: 12 % (ref 3–12)
Neutro Abs: 1.6 10*3/uL — ABNORMAL LOW (ref 1.7–7.7)
Neutrophils Relative %: 58 % (ref 43–77)
Platelets: 146 10*3/uL — ABNORMAL LOW (ref 150–400)
RBC: 3.59 MIL/uL — ABNORMAL LOW (ref 3.87–5.11)
RDW: 15.8 % — ABNORMAL HIGH (ref 11.5–15.5)
WBC: 2.8 10*3/uL — ABNORMAL LOW (ref 4.0–10.5)

## 2011-08-22 LAB — PHOSPHORUS: Phosphorus: 1.9 mg/dL — ABNORMAL LOW (ref 2.3–4.6)

## 2011-08-22 LAB — MAGNESIUM: Magnesium: 1.7 mg/dL (ref 1.5–2.5)

## 2011-08-22 LAB — GLUCOSE, CAPILLARY: Glucose-Capillary: 72 mg/dL (ref 70–99)

## 2011-08-22 LAB — CLOSTRIDIUM DIFFICILE BY PCR: Toxigenic C. Difficile by PCR: POSITIVE — AB

## 2011-08-22 LAB — MRSA PCR SCREENING: MRSA by PCR: NEGATIVE

## 2011-08-22 LAB — LIPASE, BLOOD: Lipase: 40 U/L (ref 11–59)

## 2011-08-22 MED ORDER — HEPARIN SODIUM (PORCINE) 1000 UNIT/ML DIALYSIS
1000.0000 [IU] | INTRAMUSCULAR | Status: DC | PRN
  Filled 2011-08-22: qty 1

## 2011-08-22 MED ORDER — PENTAFLUOROPROP-TETRAFLUOROETH EX AERO: 1.0000 "application " | INHALATION_SPRAY | CUTANEOUS | Status: DC | PRN

## 2011-08-22 MED ORDER — LAMIVUDINE 10 MG/ML PO SOLN
50.0000 mg | Freq: Every day | ORAL | Status: DC
  Administered 2011-08-22 – 2011-09-02 (×12): 50 mg via ORAL
  Filled 2011-08-22 (×12): qty 5

## 2011-08-22 MED ORDER — PARICALCITOL 5 MCG/ML IV SOLN
4.0000 ug | INTRAVENOUS | Status: DC
  Filled 2011-08-22 (×2): qty 0.8

## 2011-08-22 MED ORDER — ONDANSETRON HCL 4 MG/2ML IJ SOLN
4.0000 mg | Freq: Once | INTRAMUSCULAR | Status: DC
  Filled 2011-08-22: qty 2

## 2011-08-22 MED ORDER — HEPARIN SODIUM (PORCINE) 1000 UNIT/ML DIALYSIS
5900.0000 [IU] | INTRAMUSCULAR | Status: DC | PRN
  Filled 2011-08-22: qty 6

## 2011-08-22 MED ORDER — VANCOMYCIN HCL IN DEXTROSE 1-5 GM/200ML-% IV SOLN
1000.0000 mg | INTRAVENOUS | Status: AC
  Administered 2011-08-22: 1000 mg via INTRAVENOUS
  Filled 2011-08-22: qty 200

## 2011-08-22 MED ORDER — SODIUM CHLORIDE 0.9 % IJ SOLN
3.0000 mL | Freq: Two times a day (BID) | INTRAMUSCULAR | Status: DC
  Administered 2011-08-22 – 2011-09-02 (×20): 3 mL via INTRAVENOUS

## 2011-08-22 MED ORDER — MORPHINE SULFATE 4 MG/ML IJ SOLN
4.0000 mg | Freq: Once | INTRAMUSCULAR | Status: DC
  Filled 2011-08-22: qty 1

## 2011-08-22 MED ORDER — RITONAVIR 100 MG PO TABS
100.0000 mg | ORAL_TABLET | Freq: Every day | ORAL | Status: DC
  Administered 2011-08-22 – 2011-09-02 (×11): 100 mg via ORAL
  Filled 2011-08-22 (×15): qty 1

## 2011-08-22 MED ORDER — STAVUDINE 1 MG/ML PO SOLR: 20.0000 mL | Freq: Every day | ORAL | Status: DC

## 2011-08-22 MED ORDER — NEPRO/CARBSTEADY PO LIQD: 237.0000 mL | Freq: Every day | ORAL | Status: DC

## 2011-08-22 MED ORDER — ASPIRIN 81 MG PO CHEW
81.0000 mg | CHEWABLE_TABLET | Freq: Every day | ORAL | Status: DC
  Administered 2011-08-22 – 2011-09-02 (×12): 81 mg via ORAL
  Filled 2011-08-22 (×13): qty 1

## 2011-08-22 MED ORDER — RENA-VITE PO TABS
1.0000 | ORAL_TABLET | Freq: Every day | ORAL | Status: DC
  Administered 2011-08-22 – 2011-08-25 (×4): 1 via ORAL
  Filled 2011-08-22 (×5): qty 1

## 2011-08-22 MED ORDER — PARICALCITOL 5 MCG/ML IV SOLN: 5.0000 ug | Freq: Once | INTRAVENOUS | Status: DC

## 2011-08-22 MED ORDER — VANCOMYCIN HCL 500 MG IV SOLR
500.0000 mg | INTRAVENOUS | Status: DC
  Administered 2011-08-23: 500 mg via INTRAVENOUS
  Filled 2011-08-22: qty 500

## 2011-08-22 MED ORDER — ONDANSETRON HCL 4 MG PO TABS
4.0000 mg | ORAL_TABLET | Freq: Four times a day (QID) | ORAL | Status: DC | PRN
  Administered 2011-08-25: 4 mg via ORAL
  Filled 2011-08-22: qty 1

## 2011-08-22 MED ORDER — NEPRO/CARBSTEADY PO LIQD: 237.0000 mL | ORAL | Status: DC | PRN

## 2011-08-22 MED ORDER — ASPIRIN 81 MG PO TABS: 81.0000 mg | ORAL_TABLET | Freq: Every day | ORAL | Status: DC

## 2011-08-22 MED ORDER — ONDANSETRON HCL 4 MG/2ML IJ SOLN: 4.0000 mg | Freq: Four times a day (QID) | INTRAMUSCULAR | Status: DC | PRN

## 2011-08-22 MED ORDER — PANTOPRAZOLE SODIUM 20 MG PO TBEC
20.0000 mg | DELAYED_RELEASE_TABLET | Freq: Every day | ORAL | Status: DC
  Administered 2011-08-23 – 2011-09-02 (×11): 20 mg via ORAL
  Filled 2011-08-22 (×11): qty 1

## 2011-08-22 MED ORDER — SODIUM CHLORIDE 0.9 % IV SOLN: 100.0000 mL | INTRAVENOUS | Status: DC | PRN

## 2011-08-22 MED ORDER — ACETAMINOPHEN 325 MG PO TABS
650.0000 mg | ORAL_TABLET | Freq: Four times a day (QID) | ORAL | Status: DC | PRN
  Administered 2011-08-31: 650 mg via ORAL
  Filled 2011-08-22: qty 2

## 2011-08-22 MED ORDER — ALTEPLASE 2 MG IJ SOLR
2.0000 mg | Freq: Once | INTRAMUSCULAR | Status: AC | PRN
  Filled 2011-08-22: qty 2

## 2011-08-22 MED ORDER — DEXTROSE 5 % IV SOLN
1.0000 g | INTRAVENOUS | Status: DC
  Administered 2011-08-22 – 2011-08-24 (×2): 1 g via INTRAVENOUS
  Filled 2011-08-22 (×4): qty 10

## 2011-08-22 MED ORDER — DARBEPOETIN ALFA-POLYSORBATE 100 MCG/0.5ML IJ SOLN
100.0000 ug | INTRAMUSCULAR | Status: DC
  Administered 2011-08-23: 100 ug via INTRAVENOUS
  Filled 2011-08-22: qty 0.5

## 2011-08-22 MED ORDER — NEPRO/CARBSTEADY PO LIQD
237.0000 mL | Freq: Three times a day (TID) | ORAL | Status: DC
  Administered 2011-08-22 – 2011-08-23 (×3): 237 mL via ORAL
  Filled 2011-08-22 (×4): qty 237

## 2011-08-22 MED ORDER — VANCOMYCIN 50 MG/ML ORAL SOLUTION
125.0000 mg | Freq: Four times a day (QID) | ORAL | Status: DC
  Administered 2011-08-22 – 2011-09-02 (×34): 125 mg via ORAL
  Filled 2011-08-22 (×51): qty 2.5

## 2011-08-22 MED ORDER — ENSURE COMPLETE PO LIQD
237.0000 mL | Freq: Two times a day (BID) | ORAL | Status: DC
  Administered 2011-08-22: 237 mL via ORAL

## 2011-08-22 MED ORDER — DARBEPOETIN ALFA-POLYSORBATE 150 MCG/0.3ML IJ SOLN: 150.0000 ug | INTRAMUSCULAR | Status: DC

## 2011-08-22 MED ORDER — ACETAMINOPHEN ER 650 MG PO TBCR: 650.0000 mg | EXTENDED_RELEASE_TABLET | Freq: Four times a day (QID) | ORAL | Status: DC | PRN

## 2011-08-22 MED ORDER — LIDOCAINE-PRILOCAINE 2.5-2.5 % EX CREA
1.0000 "application " | TOPICAL_CREAM | CUTANEOUS | Status: DC | PRN
  Filled 2011-08-22: qty 5

## 2011-08-22 MED ORDER — LIDOCAINE HCL (PF) 1 % IJ SOLN: 5.0000 mL | INTRAMUSCULAR | Status: DC | PRN

## 2011-08-22 MED ORDER — STAVUDINE 20 MG PO CAPS
20.0000 mg | ORAL_CAPSULE | Freq: Every day | ORAL | Status: DC
  Administered 2011-08-22 – 2011-08-25 (×4): 20 mg via ORAL
  Filled 2011-08-22 (×5): qty 1

## 2011-08-22 MED ORDER — DARUNAVIR ETHANOLATE 800 MG PO TABS
800.0000 mg | ORAL_TABLET | Freq: Every day | ORAL | Status: DC
  Administered 2011-08-22 – 2011-09-02 (×11): 800 mg via ORAL
  Filled 2011-08-22 (×14): qty 1

## 2011-08-22 MED ORDER — DARBEPOETIN ALFA-POLYSORBATE 100 MCG/0.5ML IJ SOLN: 100.0000 ug | INTRAMUSCULAR | Status: DC

## 2011-08-22 NOTE — Progress Notes (Signed)
Received patient and report from [place] in 2100.  Vital signs stable upon arrival to unit.  BP 118/70  Pulse 65  Temp 97.6 F (36.4 C) (Oral)  Resp 20  Ht 5\' 3"  (1.6 m)  Wt 55.2 kg (121 lb 11.1 oz)  BMI 21.56 kg/m2  SpO2 100% Patient oriented to room, call bell within reach, patient instructed to call before attempting to get out of bed.  Will continue to monitor closely.

## 2011-08-22 NOTE — Progress Notes (Addendum)
Subjective: Pt had ab pain at HD Thursday and came to the hospital found to have fever.  Pt denies ab pain, nausea, vomiting, diarrhea, denies blood in stool, denies h/a, +sob chronic with exertion, pt does not make urine, denies chest pain. She reports compliance with all medications.    Objective: Vital signs in last 24 hours: Filed Vitals:   08/22/11 1400 08/22/11 1500 08/22/11 1600 08/22/11 1700  BP: 117/83 132/77 121/65 125/67  Pulse: 71 59 70 84  Temp:   98.5 F (36.9 C)   TempSrc:   Oral   Resp: 16 19 18 15   Height:      Weight:      SpO2: 100% 58% 100% 100%   Weight change:   Intake/Output Summary (Last 24 hours) at 08/22/11 1847 Last data filed at 08/22/11 1600  Gross per 24 hour  Intake   1644 ml  Output      3 ml  Net   1641 ml   Vitals reviewed. General: resting in bed, NAD, alert and oriented x 3 HEENT: [place]/AT Cardiac: RRR, +systolic murmer, chest with HD site  Pulm: ctab Abd: soft, min tender to palpation periumbilically, nondistended, BS present (normal) Ext: warm and well perfused, no pedal edema Neuro: alert and oriented X3  Lab Results: Basic Metabolic Panel:  Lab [id number] 0100 08/21/11 2342  NA -- 137  K -- 3.0*  CL -- 102  CO2 -- 27  GLUCOSE -- 89  BUN -- 15  CREATININE -- 3.69*  CALCIUM -- 7.7*  MG 1.7 --  PHOS 1.9* --   Liver Function Tests:  Lab 08/21/11 2342  AST 26  ALT 5  ALKPHOS 120*  BILITOT 0.3  PROT 8.9*  ALBUMIN 2.0*    Lab 08/22/11 0100  LIPASE 40  AMYLASE --   CBC:  Lab 08/21/11 2342  WBC 2.8*  NEUTROABS 1.6*  HGB 11.2*  HCT 35.6*  MCV 99.2  PLT 146*   CBG:  Lab 08/22/11 0503  GLUCAP 72     Misc. Labs: +C. dificile   Micro Results: Recent Results (from the past 240 hour(s))  MRSA PCR SCREENING     Status: Normal   Collection Time   08/22/11  5:24 AM      Component Value Range Status Comment   MRSA by PCR NEGATIVE  NEGATIVE Final   CLOSTRIDIUM DIFFICILE BY PCR     Status: Abnormal   Collection  Time   08/22/11 10:48 AM      Component Value Range Status Comment   C difficile by pcr POSITIVE (*) NEGATIVE Final    Studies/Results: Dg Abd Acute W/chest  08/22/2011  *RADIOLOGY REPORT*  Clinical Data: Abdominal pain  ACUTE ABDOMEN SERIES (ABDOMEN 2 VIEW & CHEST 1 VIEW)  Comparison: 07/11/2011 and earlier studies  Findings: Right IJ tunneled hemodialysis catheter extends to the proximal right atrium.  Stable mild cardiomegaly.  Vascular clips in the left axilla.  Lungs are clear.  No effusion.  No free air. Nonobstructive bowel gas pattern.  Left pelvic phlebolith. Regional bones unremarkable.  IMPRESSION:  1.  Stable mild cardiomegaly. 2.  Normal bowel gas pattern.  No free air.  Original Report Authenticated By: Trecia Rogers, M.D.   Medications:  Scheduled Meds:   . acetaminophen  975 mg Oral Once  . aspirin  81 mg Oral Daily  . darbepoetin  100 mcg Intravenous Q7 days  . darunavir  800 mg Oral Q breakfast  . feeding supplement (NEPRO  CARB STEADY)  237 mL Oral TID BM  . lamiVUDine  50 mg Oral Daily  . multivitamin  1 tablet Oral Daily  . paricalcitol  4 mcg Intravenous 3 times weekly  . ritonavir  100 mg Oral Q breakfast  . sodium chloride  3 mL Intravenous Q12H  . stavudine  20 mg Oral Daily  . vancomycin  125 mg Oral QID  . DISCONTD: acetaminophen  1,000 mg Oral Once  . DISCONTD: aspirin  81 mg Oral Daily  . DISCONTD: darbepoetin  100 mcg Intravenous Q7 days  . DISCONTD: darbepoetin  150 mcg Subcutaneous Q7 days  . DISCONTD: feeding supplement  237 mL Oral BID BM  . DISCONTD: feeding supplement (NEPRO CARB STEADY)  237 mL Oral Daily  . DISCONTD:  morphine injection  4 mg Intravenous Once  . DISCONTD: ondansetron (ZOFRAN) IV  4 mg Intravenous Once  . DISCONTD: paricalcitol  5 mcg Intravenous Once in dialysis  . 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 Diff (07/2011), h/o bacteriuria (VRE), MGUS, bacteremia with MSSA (05/2011), anemia, pancytopenia presents with fever and ab pain.    1. Fever -T max 102. 9 -lactic acid 1.8 -vitals when seeing patient this am HR 58-62; SpO2 100 %, RR 16-17, BP 128/85 -etiology related to + C diff, h/o C. Diff 07/12/2011   2. C.diff -associated with fever, ab pain (lipase 40), hypotension on admission 96/62 -isolation precautions and protocol -Rx oral Vancomycin 125 mg qid will tx x 14 days per Dr. Tommy Medal   3. HIV INFECTION  -pending CD4 and viral ct -last CD4 ct 210 07/2011 and 07/2011 Viral load 4783 -restarted home meds (Norvir, Epivir, Prezista,  -ID following (Dr. Wilder Glade or Johnnye Sima)   4. ESRD (end stage renal disease) (03/19/2007)  --renal consulted  -HD TTHSat   5. ANEMIA OF CHRONIC DISEASE -H/H 11.2/35.6 MCV 99.3 -Aransep ordered    6. Pancytopenia  -wbc 2.8, platelets 146, see #5 for H/H  7. DVT Px -Hep/SCDs -protonix   8. F/E/N -renal following electrolytes -diet ordered   9. Dispo -will admit for tx and d/c when stable    LOS: 1 day   Cresenciano Genre 08/22/2011, 6:47 PM

## 2011-08-22 NOTE — Progress Notes (Signed)
CRITICAL VALUE ALERT  Critical value received:  C-diff + Date of notification: 08-22-2011  Time of notification: 1509  Critical value read back: yes  Nurse who received alert: Jonelle Sidle RN  MD notified (1st page):  Dr. Aundra Dubin  Time of first page:  1512  MD notified (2nd page):  Time of second page:  Responding MD:    Time MD responded:

## 2011-08-22 NOTE — Consult Note (Signed)
[name] 08/22/2011 [name] D Requesting Physician:  Dr. Marinda [name]  Reason for Consult:  Need for dialysis and related services in ESRD pt HPI: The patient is a 56 y.o. year-old AAF with HIV, ESRD, hx CDif, multiple admission for infections who presented to ED earlier today with abd pain and fever 102 F. No N/V or diarrhea. No prod cough or CP.  Patient was last here in May 2013 with MV endocarditis and MSSA bacteremia/cath sepsis. Cath was replaced. Currently she is without complaints. No recent dialysis problems.  Has not missed HD. No SOB  Admit  Diagnosis April 2011  abd wall cellulitis May 2011 R thigh cellulitis Sept 2011  Diarrhea, Cdif neg, + thrush Feb 2012 MSSA bacteremia, TEE neg Mar 2012 Cdif+, VRE UTI Dec 2012 PNA, MSSA bacteremia - 6 wk IV Ancef May 2013 MSSA bacteremia, MV vegetation - not surg candidate, 6 wks IV Ancef thru 6/29; HD cath replaced     Past Medical History:  Past Medical History  Diagnosis Date  . Human immunodeficiency virus (HIV) disease   . Hypertension   . ESRD (end stage renal disease)   . Dialysis patient     pt on dialysis since 2010.  [name] Clostridium difficile infection 04/04/10  . Bacteriuria, asymptomatic 04/04/10    Culture grew VRE sensitive to linesolid   . MGUS (monoclonal gammopathy of unknown significance)   . History of bacteremia     MSSA  . Renal insufficiency   . Anemia     Past Surgical History:  Past Surgical History  Procedure Date  . Av fistula placement   . Insertion of dialysis catheter 12/30/2010    Procedure: INSERTION OF DIALYSIS CATHETER;  Surgeon: Elam Dutch, MD;  Location: [place];  Service: Vascular;  Laterality: Left;  Insertion of left internal jugular dialysis catheter  . Vascular surgery   . Insertion of dialysis catheter 05/26/2011    Procedure: INSERTION OF DIALYSIS CATHETER;  Surgeon: Conrad Hepburn, MD;  Location: [facility] OR;  Service: Vascular;  Laterality: N/A;  Insertion tunneled dialysis catheter  in Right Internal Jugular with 23 cm catheter     Family History:  Family History  Problem Relation Age of Onset  . Alcohol abuse      family h/o addiction/alcoholism  . Diabetes Cousin     first degree relatives  . Kidney disease Mother   . Kidney disease Maternal Uncle   . Cancer Sister    Social History:  reports that she has never smoked. She has never used smokeless tobacco. She reports that she does not drink alcohol or use illicit drugs.  Allergies: No Known Allergies  Home medications: Prior to Admission medications   Medication Sig Start Date End Date Taking? Authorizing Provider  acetaminophen (TYLENOL) 650 MG CR tablet Take 650 mg by mouth every 8 (eight) hours as needed. For pain.   Yes Historical Provider, MD  aspirin 81 MG tablet Take 81 mg by mouth daily.     Yes Historical Provider, MD  b complex vitamins tablet Take 1 tablet by mouth daily.     Yes Historical Provider, MD  b complex-vitamin c-folic acid (NEPHRO-VITE) 0.8 MG TABS Take 0.8 mg by mouth daily.     Yes Historical Provider, MD  darunavir (PREZISTA) 400 MG tablet Take 2 tablets (800 mg total) by mouth daily with breakfast. 12/31/10 12/31/11 Yes Olga Millers, MD  feeding supplement (ENSURE COMPLETE) LIQD Take 237 mLs by mouth 2 (two) times daily  between meals. 05/30/11  Yes Na Li, MD  lamiVUDine (EPIVIR) 10 MG/ML solution Take 1 teaspoon by mouth every day.    Yes Historical Provider, MD  metoprolol succinate (TOPROL-XL) 12.5 mg TB24 Take 0.5 tablets (12.5 mg total) by mouth at bedtime. 05/30/11  Yes Na Li, MD  Nutritional Supplements (FEEDING SUPPLEMENT, NEPRO CARB STEADY,) LIQD Take 237 mLs by mouth as needed (missed meal during dialysis.). 05/30/11  Yes Na Nicoletta Dress, MD  ritonavir (NORVIR) 100 MG TABS Take 100 mg by mouth daily with breakfast.    Yes Historical Provider, MD  Stavudine (ZERIT) 1 MG/ML SOLR Take 4 teaspoons(20 ml) by mouth every day.    Yes Historical Provider, MD  darbepoetin (ARANESP, ALBUMIN  FREE,) 150 MCG/0.3ML SOLN Inject 150 mcg into the skin. Use weekly for Epogen 20000 units. Dialysis nurse to administer.     Historical Provider, MD  iron sucrose (VENOFER) 20 MG/ML injection 100 mg IV on Tuesday HD(HD RN to dispense)     Historical Provider, MD  paricalcitol (ZEMPLAR) 5 MCG/ML injection 4 micrograms IV- dispensed by dialysis nurse.     Historical Provider, MD    Inpatient medications:    . acetaminophen  975 mg Oral Once  . aspirin  81 mg Oral Daily  . darbepoetin  150 mcg Subcutaneous Q7 days  . darunavir  800 mg Oral Q breakfast  . feeding supplement (NEPRO CARB STEADY)  237 mL Oral TID BM  . lamiVUDine  50 mg Oral Daily  . multivitamin  1 tablet Oral Daily  . ritonavir  100 mg Oral Q breakfast  . sodium chloride  3 mL Intravenous Q12H  . stavudine  20 mg Oral Daily  . DISCONTD: acetaminophen  1,000 mg Oral Once  . DISCONTD: aspirin  81 mg Oral Daily  . DISCONTD: feeding supplement  237 mL Oral BID BM  . DISCONTD:  morphine injection  4 mg Intravenous Once  . DISCONTD: ondansetron (ZOFRAN) IV  4 mg Intravenous Once  . DISCONTD: paricalcitol  5 mcg Intravenous Once in dialysis  . DISCONTD: Stavudine  20 mL Oral Daily    Review of Systems Gen:  As above. No weight loss. HEENT:  No visual change, sore throat, difficulty swallowing. Resp:  No difficulty breathing, DOE.  No cough or hemoptysis. Cardiac:  No chest pain, orthopnea, PND.  Denies edema. GI: As above.   No nausea, vomiting, diarrhea.  No constipation. GU:  Denies difficulty or change in voiding.  No change in urine color.     MS:  Denies joint pain or swelling.   Derm:  Denies skin rash or itching.  No chronic skin conditions.  Neuro:   Denies focal weakness, memory problems, hx stroke or TIA.   Psych:  Denies symptoms of depression of anxiety.  No hallucination.    Labs: Basic Metabolic Panel:  Lab [id number] 0100 08/21/11 2342  NA -- 137  K -- 3.0*  CL -- 102  CO2 -- 27  GLUCOSE -- 89  BUN --  15  CREATININE -- 3.69*  ALB -- --  CALCIUM -- 7.7*  PHOS 1.9* --   Liver Function Tests:  Lab 08/21/11 2342  AST 26  ALT 5  ALKPHOS 120*  BILITOT 0.3  PROT 8.9*  ALBUMIN 2.0*    Lab 08/22/11 0100  LIPASE 40  AMYLASE --   No results found for this basename: AMMONIA:3 in the last 168 hours CBC:  Lab 08/21/11 2342  WBC 2.8*  NEUTROABS 1.6*  HGB 11.2*  HCT 35.6*  MCV 99.2  PLT 146*   PT/INR: @labrcntip (inr:5) Cardiac Enzymes: No results found for this basename: CKTOTAL:5,CKMB:5,CKMBINDEX:5,TROPONINI:5 in the last 168 hours CBG:  Lab 08/22/11 0503  GLUCAP 72    Iron Studies: No results found for this basename: IRON:30,TIBC:30,TRANSFERRIN:30,FERRITIN:30 in the last 168 hours  Xrays/Other Studies: Dg Abd Acute W/chest  08/22/2011  *RADIOLOGY REPORT*  Clinical Data: Abdominal pain  ACUTE ABDOMEN SERIES (ABDOMEN 2 VIEW & CHEST 1 VIEW)  Comparison: 07/11/2011 and earlier studies  Findings: Right IJ tunneled hemodialysis catheter extends to the proximal right atrium.  Stable mild cardiomegaly.  Vascular clips in the left axilla.  Lungs are clear.  No effusion.  No free air. Nonobstructive bowel gas pattern.  Left pelvic phlebolith. Regional bones unremarkable.  IMPRESSION:  1.  Stable mild cardiomegaly. 2.  Normal bowel gas pattern.  No free air.  Original Report Authenticated By: Trecia Rogers, M.D.    Physical Exam:  Blood pressure 114/71, pulse 58, temperature 97.6 F (36.4 C), temperature source Oral, resp. rate 20, height 5\' 3"  (1.6 m), weight 55.2 kg (121 lb 11.1 oz), SpO2 100.00%.  Gen: alert, no distress, calm Skin: no rash, cyanosis HEENT:  EOMI, sclera anicteric, throat clear Neck: no JVD, no bruits or LAN Chest: clear bilat, no rales or rhonchi Heart: regular, no rub or gallop, soft SEM Abdomen: soft, nontender, liver down 3cm below RCM, no ascites Ext: R chest HD cath exit site is clean w/ small amt crusted material, no pus or erythema. Tunnel  nontender and unremarkable Neuro: alert, Ox3, no focal deficit, no asterixis  Outpatient HD: [facility], 4 hrs, 54 kg EDW. Hep 5900u. EPO 13,000u. Zemplar 4ug. R IJ TDcath. Nepro 1 can each HD. 2K/2.25Ca bath.    Impression/Plan 1. Fever, abd pain- recent hx of endocarditis and MSSA bacteremia in May. Blood cx's sent, on empiric abx.   2. ESRD TTS at [facility]; continue usual schedule. BP 118/70, over dry weight by 1-2kg. 3. HTN/volume- only BP med is metoprolol 12.5 qhs. On hold for low BP 4. CM, EF 40-45% 5. HIV- on ART 6. Hx MSSA endocarditis May 2013 7. End-stage access- no further perm access per [facility] I am told 8. Anemia of CKD- cont ESA. Not on IV iron. 9. Metabolic bone disease- cont Zemplar. Not on binders. Last PTH 225, at goal.    Kelly Splinter  MD [facility] [phone] pgr    [phone] cell 08/22/2011, 12:55 PM

## 2011-08-22 NOTE — Consult Note (Signed)
[place] for Infectious Disease             Total days of antibiotics: HAART medication                      Reason for Consult: HIV pt with  Fever, lower abdominal pain recent h/o endocarditis 05/2011 and c dif 07/2011   Referring Physician: Axel Filler, MD  Principal Problem:  *HIV INFECTION Active Problems:  ANEMIA OF CHRONIC DISEASE  Pancytopenia  ESRD (end stage renal disease)  Protein calorie malnutrition  Fever  Abdominal pain  Hypotension  HPI: [name] is a 56 y.o. 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 Diffuse abdominal pain started after her last HD on thursday associated with subjective feeling of fever. At ER, abdomen and chest x ray negative for any acute findings as well as labs negative for acute abdomen .    Pt had soft BM 3 times since this morning. Pt also included she doesn't have good appetite and subjective feeling of lweight loss. Patient denies chills, n/v, chest pain, SOB or dysuria. No recent sick contacts or travel.    Review of Systems: Pertinent items are noted in HPI.  [name] acetaminophen  975 mg Oral Once  . aspirin  81 mg Oral Daily  . darbepoetin  150 mcg Subcutaneous Q7 days  . darunavir  800 mg Oral Q breakfast  . feeding supplement  237 mL Oral BID BM  . lamiVUDine  50 mg Oral Daily  . multivitamin  1 tablet Oral Daily  . ritonavir  100 mg Oral Q breakfast  . sodium chloride  3 mL Intravenous Q12H  . stavudine  20 mg Oral Daily  . DISCONTD: acetaminophen  1,000 mg Oral Once  . DISCONTD: aspirin  81 mg Oral Daily  . DISCONTD:  morphine injection  4 mg Intravenous Once  . DISCONTD: ondansetron (ZOFRAN) IV  4 mg Intravenous Once  . DISCONTD: paricalcitol  5 mcg Intravenous Once in dialysis  . DISCONTD: Stavudine  20 mL Oral Daily   Past Medical History  Diagnosis Date  .  Human immunodeficiency virus (HIV) disease   . Hypertension   . ESRD (end stage renal disease)   . Dialysis patient     pt on dialysis since 2010.  [name] Clostridium difficile infection 04/04/10  . Bacteriuria, asymptomatic 04/04/10    Culture grew VRE sensitive to linesolid   . MGUS (monoclonal gammopathy of unknown significance)   . History of bacteremia     MSSA  . Renal insufficiency   . Anemia    History  Substance Use Topics  . Smoking status: Never Smoker   . Smokeless tobacco: Never Used  . Alcohol Use: No    Family History  Problem Relation Age of Onset  . Alcohol abuse      family h/o addiction/alcoholism  . Diabetes Cousin     first degree relatives  . Kidney disease Mother   . Kidney disease Maternal Uncle   . Cancer Sister    No Known Allergies  OBJECTIVE: Blood pressure 128/85, pulse 59, temperature 97.8 F (36.6 C), temperature source Axillary, resp. rate 17, height 5\' 3"  (1.6 m), weight 55.2 kg (121 lb 11.1 oz), SpO2 100.00%.  General: resting in bed, NAD Skin: scar from previous surgery HEENT:  PERRL, EOMI, no scleral icterus, no oral thrush, lymphadenopathy Cardiac: RRR, 2/6 systolic murmur,  IJ catheter on rt chest, no erythema or swelling Pulm: clear to auscultation bilaterally, moving normal volumes of air Abd: soft, nontender, nondistended, diminished BS present Ext: warm and well perfused, no pedal edema Neuro: alert and oriented X3, cranial nerves II-XII grossly intact  Microbiology: Recent Results (from the past 240 hour(s))  MRSA PCR SCREENING     Status: Normal   Collection Time   08/22/11  5:24 AM      Component Value Range Status Comment   MRSA by PCR NEGATIVE  NEGATIVE Final    Allergies: Allergies as of 08/21/2011  . (No Known Allergies)   Past Medical History  Diagnosis Date  . Human immunodeficiency virus (HIV) disease   . Hypertension   . ESRD (end stage renal disease)   . Dialysis patient     pt on dialysis since 2010.  [name]  Clostridium difficile infection 04/04/10  . Bacteriuria, asymptomatic 04/04/10    Culture grew VRE sensitive to linesolid   . MGUS (monoclonal gammopathy of unknown significance)   . History of bacteremia     MSSA  . Renal insufficiency   . Anemia    Past Surgical History  Procedure Date  . Av fistula placement   . Insertion of dialysis catheter 12/30/2010    Procedure: INSERTION OF DIALYSIS CATHETER;  Surgeon: Elam Dutch, MD;  Location: [place];  Service: Vascular;  Laterality: Left;  Insertion of left internal jugular dialysis catheter  . Vascular surgery   . Insertion of dialysis catheter 05/26/2011    Procedure: INSERTION OF DIALYSIS CATHETER;  Surgeon: Conrad Chatom, MD;  Location: [facility] OR;  Service: Vascular;  Laterality: N/A;  Insertion tunneled dialysis catheter in Right Internal Jugular with 23 cm catheter    Family History  Problem Relation Age of Onset  . Alcohol abuse      family h/o addiction/alcoholism  . Diabetes Cousin     first degree relatives  . Kidney disease Mother   . Kidney disease Maternal Uncle   . Cancer Sister    Lab results: Basic Metabolic Panel:  Lab [id number] 0100 08/21/11 2342  NA -- 137  K -- 3.0*  CL -- 102  CO2 -- 27  GLUCOSE -- 89  BUN -- 15  CREATININE -- 3.69*  CALCIUM -- 7.7*  MG 1.7 --  PHOS 1.9* --   Liver Function Tests:  Lab 08/21/11 2342  AST 26  ALT 5  ALKPHOS 120*  BILITOT 0.3  PROT 8.9*  ALBUMIN 2.0*    Lab 08/22/11 0100  LIPASE 40  AMYLASE --   CBC:  Lab 08/21/11 2342  WBC 2.8*  NEUTROABS 1.6*  HGB 11.2*  HCT 35.6*  MCV 99.2  PLT 146*   CBG:  Lab 08/22/11 0503  GLUCAP 72   Imaging results:  Dg Abd Acute W/chest  08/22/2011 1.  Stable mild cardiomegaly. 2.  Normal bowel gas pattern.  No free air.    Assessment and Plan by Problem: [name] is a 56 y.o. 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  1. Abdominal pain and fever: Abdominal pain resolved with morphine and has been afebrile for last 24 hrs. Chest x-ray and labs negative for acute abdomen. But new symptoms of diarrhea from  today and positive c.diff PCR.  As well as pt has had h/o hospitalization for C.diff on 07/2011 and treated with flagyl for 2 weeks. 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  1. Started vancomycin 125 mg, PO, QID  For 2 weeks 2. Contact isolation  2. HIV/AIDS: H/o HIV and on HAART medication. PCP pneumonia on 5/13 and treated with bactrim. Recent improvement of viral load 4783 and CD4 210 (07/28/11) from [id number] and 80 (07/12/11) respectively. Pt was seen in the ID clinic on 7/22 by Dr Baxter Flattery. Next appointment on 08/27/11.  - Continue oral  Darunavir, lamiVUDine, Ritonavir, Stavudine  3. 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 Possible TEE when c.diff resolved.    This is a Careers information officer Note.  The care of the patient was discussed with Dr. Tommy Medal and the assessment and plan was formulated with their assistance.  Please see their note for official documentation of the patient encounter.   Signed: Patton Salles 08/22/2011, 10:13 AM   INFECTIOUS DISEASE ATTENDING ADDENDUM:     [place] for Infectious Disease   Date: 08/22/2011  Patient name: [name]  Medical record number: [id number]  Date of birth: [birth year]    This patient has been seen and discussed with the house staff. Please see their note for complete details. I concur with their findings with the following additions/corrections:  Rhina Brackett Dam 08/22/2011, 6:53 PM  Patient with FIRST recurrence of CDI. Will treat per CDI order set protocol with 14 days of Po vancomycin  CHeck Viral load and CD4 count   Dr. Johnnye Sima covering this weekend.
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
## 2011-08-22 NOTE — Progress Notes (Signed)
UR Completed.  [name] [id number] 08/22/2011

## 2011-08-22 NOTE — Telephone Encounter (Signed)
Called patient's emergency contact and asked them to contact her and have her call the clinic, she needs a lab appt.  Her phone is not in service. Myrtis Hopping CMA

## 2011-08-22 NOTE — Progress Notes (Signed)
Dr. Bailey Mech notified of C-diff + stool.  Will continue to monitor awaiting new orders.

## 2011-08-22 NOTE — ED Notes (Signed)
Patient transported to room.

## 2011-08-22 NOTE — ED Notes (Signed)
Pt resting with eyes closed.  Even respirations noted.  HR monitoring continued.  NSR on monitor.  Vital Signs within normal limits.  Side rails up x 2.  Call light within reach.  Awaiting bed assignment for admission.

## 2011-08-22 NOTE — Telephone Encounter (Signed)
Message copied by Georgena Spurling on Fri Aug 22, 2011  1:51 PM ------      Message from: HATCHER, JEFFREY C      Created: Fri Aug 22, 2011 11:21 AM       Please have ms Lazcano get a repeat BMP, had low K+.

## 2011-08-22 NOTE — ED Notes (Signed)
After 1 liter of fluid pt BP improved but pt currently denies pain of any kind morphine continues to be held

## 2011-08-22 NOTE — Progress Notes (Signed)
Spoke with Dr. Tommy Medal will tx for C.Diff x 14 days Aundra Dubin [phone]

## 2011-08-22 NOTE — ED Notes (Signed)
Patient transported to X-ray 

## 2011-08-22 NOTE — ED Notes (Signed)
Pt arrived alert x3 but skin hot to touch c/o mid abd pain onset 2000 pt reports hx HIV denies N/V/D but last BM 2 days ago oral temp > 102 medicated tylenol 1000 mg po second IV site obtained and labs with first set blood cultures drawn from L lateral forearm lactic acid on ice taken to lab.

## 2011-08-22 NOTE — H&P (Signed)
Internal Medicine Attending Admission Note Date: 08/22/2011  Patient name: [name] Medical record number: [id number] Date of birth: 10-10-1955 Age: 56 y.o. Gender: female  I saw and evaluated the patient. I reviewed the resident's note and I agree with the resident's findings and plan as documented in the resident's note, with the following additional comments.  Chief Complaint(s): Abdominal pain, fever  History - key components related to admission: Patient is a 56 year old woman with a history of HIV,AIDS, bacterial endocarditis, PCP, hypertension, ESRD on hemodialysis, cardiomyopathy, PCP, MSSA bacteremia, Clostridium difficile infection, MGUS, and other problems as outlined in medical history, admitted with complaint of lower abdominal pain which began yesterday following hemodialysis.  She denies nausea, vomiting, diarrhea, or blood in stools; she also denies chest pain or shortness of breath.   Physical Exam - key components related to admission:  Temp (24hrs), Avg:99.4 F (37.4 C), Min:97.6 F (36.4 C), Max:102.9 F (39.4 C)   Filed Vitals:   08/22/11 0900 08/22/11 1000 08/22/11 1100 08/22/11 1200  BP: 128/85 143/75 114/71 118/70  Pulse: 59 58 58 65  Temp:   97.6 F (36.4 C)   TempSrc:   Oral   Resp: 17 14 20 20   Height:      Weight:      SpO2: 100% 100% 100% 100%    General: Alert, no distress Lungs: Clear Heart: Regular; [id number], 2/6 systolic murmur loudest at the upper left sternal border; no S3, no S4 Abdomen: Bowel sounds positive, soft; mild tenderness of mid lower abdomen; no rebound.   Extremities: No edema.    Lab results:   Basic Metabolic Panel:  Basename 08/22/11 0100 08/21/11 2342  NA -- 137  K -- 3.0*  CL -- 102  CO2 -- 27  GLUCOSE -- 89  BUN -- 15  CREATININE -- 3.69*  CALCIUM -- 7.7*  MG 1.7 --  PHOS 1.9* --    Liver Function Tests:  [facility] 08/21/11 2342  AST 26  ALT 5  ALKPHOS 120*  BILITOT 0.3  PROT 8.9*  ALBUMIN 2.0*      Basename 08/22/11 0100  LIPASE 40  AMYLASE --    CBC:  Basename 08/21/11 2342  WBC 2.8*  NEUTROABS 1.6*  HGB 11.2*  HCT 35.6*  MCV 99.2  PLT 146*    CBG:  Basename 08/22/11 0503  GLUCAP 72    HIV 1 RNA Quant  Date Value Range Status  07/28/2011 4783* <20 copies/mL Final     CD4 T Cell Abs  Date Value Range Status  07/29/2011 210* 400 - 2700 cmm Final      Imaging results:  Dg Abd Acute W/chest  08/22/2011  *RADIOLOGY REPORT*  Clinical Data: Abdominal pain  ACUTE ABDOMEN SERIES (ABDOMEN 2 VIEW & CHEST 1 VIEW)  Comparison: 07/11/2011 and earlier studies  Findings: Right IJ tunneled hemodialysis catheter extends to the proximal right atrium.  Stable mild cardiomegaly.  Vascular clips in the left axilla.  Lungs are clear.  No effusion.  No free air. Nonobstructive bowel gas pattern.  Left pelvic phlebolith. Regional bones unremarkable.  IMPRESSION:  1.  Stable mild cardiomegaly. 2.  Normal bowel gas pattern.  No free air.  Original Report Authenticated By: Trecia Rogers, M.D.     Assessment & Plan by Problem:  1.  Abdominal pain and fever.  Etiology is unclear; patient reports pain has improved significantly from yesterday, and her exam shows mild tenderness only.  Given her history of bacterial endocarditis and  with a dialysis catheter in place, a bloodstream infection is obviously a concern.  The absence of diarrhea would weigh against recurrent Clostridium difficile colitis.  She does not appear septic, and we have not started empiric antibiotics.  Plan is to await results of blood cultures; monitor closely; infectious disease consult; would have a low threshold for CT of her abdomen/pelvis if her pain persists or worsens or if her fever persists.  2.  HIV/AIDS.  Patient's last CD4 count was 210 in July, improved from prior value of 80.  Plan is continue current antiretroviral therapy and consult ID.  3.  End-stage renal disease.  Nephrology consulted;  continue hemodialysis and management as per nephrology.  4.  Anemia of chronic disease.  Hemoglobin is stable; follow CBC.  5.  Other problems as per resident physician.

## 2011-08-22 NOTE — Progress Notes (Signed)
ANTIBIOTIC CONSULT NOTE - INITIAL  Pharmacy Consult for vancomycin and rocephin Indication: gram + cocci in blood cx's   No Known Allergies  Patient Measurements: Height: 5\' 3"  (160 cm) Weight: 121 lb 11.1 oz (55.2 kg) IBW/kg (Calculated) : 52.4  Adjusted Body Weight:   Vital Signs: Temp: 99.8 F (37.7 C) (08/16 2037) Temp src: Oral (08/16 2037) BP: 93/63 mmHg (08/16 2037) Pulse Rate: 79  (08/16 2037) Intake/Output from previous day: 08/15 0701 - 08/16 0700 In: 1004 [I.V.:1000; IV Piggyback:4] Out: 0  Intake/Output from this shift:    Labs:  [facility] 08/21/11 2342  WBC 2.8*  HGB 11.2*  PLT 146*  LABCREA --  CREATININE 3.69*   Estimated Creatinine Clearance: 14.4 ml/min (by C-G formula based on Cr of 3.69). No results found for this basename: VANCOTROUGH:2,VANCOPEAK:2,VANCORANDOM:2,GENTTROUGH:2,GENTPEAK:2,GENTRANDOM:2,TOBRATROUGH:2,TOBRAPEAK:2,TOBRARND:2,AMIKACINPEAK:2,AMIKACINTROU:2,AMIKACIN:2, in the last 72 hours   Microbiology: Recent Results (from the past 720 hour(s))  CULTURE, BLOOD (ROUTINE X 2)     Status: Normal (Preliminary result)   Collection Time   08/21/11 11:35 PM      Component Value Range Status Comment   Specimen Description BLOOD LEFT ARM   Final    Special Requests BOTTLES DRAWN AEROBIC AND ANAEROBIC 5ML   Final    Culture  Setup Time 08/22/2011 04:58   Final    Culture     Final    Value: GRAM POSITIVE COCCI IN PAIRS     Note: Gram Stain Report Called to,Read Back By and Verified With: KIMBERLY PORTER @ 2150 ON 08/22/11 BY GOLLD   Report Status PENDING   Incomplete   CULTURE, BLOOD (ROUTINE X 2)     Status: Normal (Preliminary result)   Collection Time   08/21/11 11:59 PM      Component Value Range Status Comment   Specimen Description BLOOD LEFT HAND   Final    Special Requests BOTTLES DRAWN AEROBIC AND ANAEROBIC 5CC   Final    Culture  Setup Time 08/22/2011 04:59   Final    Culture     Final    Value: GRAM POSITIVE COCCI IN PAIRS   Note: Gram Stain Report Called to,Read Back By and Verified With: KIMBERLY PORTER @ 2150 ON 08/22/11 BY GOLLD   Report Status PENDING   Incomplete   MRSA PCR SCREENING     Status: Normal   Collection Time   08/22/11  5:24 AM      Component Value Range Status Comment   MRSA by PCR NEGATIVE  NEGATIVE Final   CLOSTRIDIUM DIFFICILE BY PCR     Status: Abnormal   Collection Time   08/22/11 10:48 AM      Component Value Range Status Comment   C difficile by pcr POSITIVE (*) NEGATIVE Final     Medical History: Past Medical History  Diagnosis Date  . Human immunodeficiency virus (HIV) disease   . Hypertension   . ESRD (end stage renal disease)   . Dialysis patient     pt on dialysis since 2010.  [name] Clostridium difficile infection 04/04/10  . Bacteriuria, asymptomatic 04/04/10    Culture grew VRE sensitive to linesolid   . MGUS (monoclonal gammopathy of unknown significance)   . History of bacteremia     MSSA  . Renal insufficiency   . Anemia     Medications:  Prescriptions prior to admission  Medication Sig Dispense Refill  . acetaminophen (TYLENOL) 650 MG CR tablet Take 650 mg by mouth every 8 (eight) hours as needed. For pain.      [name]  aspirin 81 MG tablet Take 81 mg by mouth daily.        [name] b complex vitamins tablet Take 1 tablet by mouth daily.        [name] b complex-vitamin c-folic acid (NEPHRO-VITE) 0.8 MG TABS Take 0.8 mg by mouth daily.        . darunavir (PREZISTA) 400 MG tablet Take 2 tablets (800 mg total) by mouth daily with breakfast.  60 tablet  1  . feeding supplement (ENSURE COMPLETE) LIQD Take 237 mLs by mouth 2 (two) times daily between meals.  60 Bottle  11  . lamiVUDine (EPIVIR) 10 MG/ML solution Take 1 teaspoon by mouth every day.       . metoprolol succinate (TOPROL-XL) 12.5 mg TB24 Take 0.5 tablets (12.5 mg total) by mouth at bedtime.  30 tablet  11  . Nutritional Supplements (FEEDING SUPPLEMENT, NEPRO CARB STEADY,) LIQD Take 237 mLs by mouth as needed (missed meal during  dialysis.).  30 Can  11  . ritonavir (NORVIR) 100 MG TABS Take 100 mg by mouth daily with breakfast.       . Stavudine (ZERIT) 1 MG/ML SOLR Take 4 teaspoons(20 ml) by mouth every day.       . darbepoetin (ARANESP, ALBUMIN FREE,) 150 MCG/0.3ML SOLN Inject 150 mcg into the skin. Use weekly for Epogen 20000 units. Dialysis nurse to administer.       . iron sucrose (VENOFER) 20 MG/ML injection 100 mg IV on Tuesday HD(HD RN to dispense)       . paricalcitol (ZEMPLAR) 5 MCG/ML injection 4 micrograms IV- dispensed by dialysis nurse.        Assessment: Critical value received: Blood cultures both bottles gram positive cocci in pairs. Pt complained of abdominal pain at Thursday HD session. Found with fever. Now cx's resulted. Ctx and vanc for g+ coverage.     Goal of Therapy:  Pre-hd vanc level 20-24mcg/ml    Plan:  vanc 1gm now then 500mg  after each full HD session t-th-sat schedule. Rocephin 1gm q24 hours starting now.   Curlene Dolphin 08/22/2011,11:21 PM

## 2011-08-22 NOTE — Progress Notes (Signed)
CRITICAL VALUE ALERT  Critical value received:  Blood cultures both bottles gram positive cocci in pairs  Date of notification:  08/22/11  Time of notification:  2150  Critical value read back:yes  Nurse who received alert:  Joelene Millin Evin Chirco  MD notified (1st page):  Dr Para Skeans  Time of first page:  2158  MD notified (2nd page):  Time of second page:  Responding MD:  Dr Para Skeans  Time MD responded:  2158

## 2011-08-22 NOTE — Progress Notes (Signed)
INITIAL ADULT NUTRITION ASSESSMENT Date: 08/22/2011   Time: 10:31 AM  Reason for Assessment: Nutrition Risk report (appears severely malnourished)  INTERVENTION:  Discontinue Ensure Complete BID.  Give Nepro PO TID.   DOCUMENTATION CODES Per approved criteria  -Severe malnutrition in the context of chronic illness    ASSESSMENT: Female 56 y.o.  Dx: Human immunodeficiency virus (HIV) disease; Abdominal pain and fever  Hx:  Past Medical History  Diagnosis Date  . Human immunodeficiency virus (HIV) disease   . Hypertension   . ESRD (end stage renal disease)   . Dialysis patient     pt on dialysis since 2010.  [name] Clostridium difficile infection 04/04/10  . Bacteriuria, asymptomatic 04/04/10    Culture grew VRE sensitive to linesolid   . MGUS (monoclonal gammopathy of unknown significance)   . History of bacteremia     MSSA  . Renal insufficiency   . Anemia    Past Surgical History  Procedure Date  . Av fistula placement   . Insertion of dialysis catheter 12/30/2010    Procedure: INSERTION OF DIALYSIS CATHETER;  Surgeon: Elam Dutch, MD;  Location: [facility];  Service: Vascular;  Laterality: Left;  Insertion of left internal jugular dialysis catheter  . Vascular surgery   . Insertion of dialysis catheter 05/26/2011    Procedure: INSERTION OF DIALYSIS CATHETER;  Surgeon: Conrad Hopedale, MD;  Location: [facility];  Service: Vascular;  Laterality: N/A;  Insertion tunneled dialysis catheter in Right Internal Jugular with 23 cm catheter     Related Meds:  Scheduled Meds:   . acetaminophen  975 mg Oral Once  . aspirin  81 mg Oral Daily  . darbepoetin  150 mcg Subcutaneous Q7 days  . darunavir  800 mg Oral Q breakfast  . feeding supplement  237 mL Oral BID BM  . lamiVUDine  50 mg Oral Daily  . multivitamin  1 tablet Oral Daily  . ritonavir  100 mg Oral Q breakfast  . sodium chloride  3 mL Intravenous Q12H  . stavudine  20 mg Oral Daily  . DISCONTD: acetaminophen  1,000 mg  Oral Once  . DISCONTD: aspirin  81 mg Oral Daily  . DISCONTD:  morphine injection  4 mg Intravenous Once  . DISCONTD: ondansetron (ZOFRAN) IV  4 mg Intravenous Once  . DISCONTD: paricalcitol  5 mcg Intravenous Once in dialysis  . DISCONTD: Stavudine  20 mL Oral Daily   Continuous Infusions:  PRN Meds:.acetaminophen, feeding supplement (NEPRO CARB STEADY), ondansetron (ZOFRAN) IV, ondansetron, DISCONTD: acetaminophen   Ht: 5\' 3"  (160 cm)  Wt: 121 lb 11.1 oz (55.2 kg)  Ideal Wt: 52.3 kg % Ideal Wt: 106%  Wt Readings from Last 10 Encounters:  08/22/11 121 lb 11.1 oz (55.2 kg)  07/28/11 122 lb (55.339 kg)  07/13/11 121 lb 1.6 oz (54.931 kg)  05/30/11 115 lb 8.3 oz (52.4 kg)  05/30/11 115 lb 8.3 oz (52.4 kg)  01/22/11 142 lb (64.411 kg)  12/30/10 137 lb 12.6 oz (62.5 kg)  12/30/10 137 lb 12.6 oz (62.5 kg)  12/23/10 133 lb (60.328 kg)  12/23/10 133 lb (60.328 kg)   Usual Wt: 142 lb (7 months ago) % Usual Wt: 85%  Body mass index is 21.56 kg/(m^2).  Food/Nutrition Related Hx: 15% weight loss in 7 months; 19% weight loss in 4 months from January to May 2013.  Labs:  CMP     Component Value Date/Time   NA 137 08/21/2011 2342   K 3.0*  08/21/2011 2342   CL 102 08/21/2011 2342   CO2 27 08/21/2011 2342   GLUCOSE 89 08/21/2011 2342   BUN 15 08/21/2011 2342   CREATININE 3.69* 08/21/2011 2342   CREATININE 4.98* 02/19/2011 1048   CALCIUM 7.7* 08/21/2011 2342   CALCIUM 7.6* 09/14/2007 1321   PROT 8.9* 08/21/2011 2342   ALBUMIN 2.0* 08/21/2011 2342   AST 26 08/21/2011 2342   ALT 5 08/21/2011 2342   ALKPHOS 120* 08/21/2011 2342   BILITOT 0.3 08/21/2011 2342   GFRNONAA 13* 08/21/2011 2342   GFRAA 15* 08/21/2011 2342    CBG (last 3)   Basename 08/22/11 0503  GLUCAP 72   Sodium  Date/Time Value Range Status  08/21/2011 11:42 PM 137  135 - 145 mEq/L Final  07/14/2011  8:47 AM 137  135 - 145 mEq/L Final  07/13/2011  6:56 AM 135  135 - 145 mEq/L Final    Potassium  Date/Time Value Range  Status  08/21/2011 11:42 PM 3.0* 3.5 - 5.1 mEq/L Final  07/14/2011  8:47 AM 4.1  3.5 - 5.1 mEq/L Final  07/13/2011  6:56 AM 4.4  3.5 - 5.1 mEq/L Final     HEMOLYSIS AT THIS LEVEL MAY AFFECT RESULT    Phosphorus  Date/Time Value Range Status  08/22/2011  1:00 AM 1.9* 2.3 - 4.6 mg/dL Final  07/14/2011  8:47 AM 1.5* 2.3 - 4.6 mg/dL Final  07/12/2011  6:05 AM 3.2  2.3 - 4.6 mg/dL Final    Magnesium  Date/Time Value Range Status  08/22/2011  1:00 AM 1.7  1.5 - 2.5 mg/dL Final  05/27/2011  3:11 PM 1.9  1.5 - 2.5 mg/dL Final  09/11/2010  4:30 AM 1.7  1.5 - 2.5 mg/dL Final     Intake/Output Summary (Last 24 hours) at 08/22/11 1048 Last data filed at 08/22/11 0900  Gross per 24 hour  Intake   1004 ml  Output      1 ml  Net   1003 ml    Diet Order: Dysphagia 1 with thin liquids  Supplements/Tube Feeding: Ensure Complete BID, Renal multivitamin daily  IVF: None  Estimated Nutritional Needs:   Kcal: 1600-1800 Protein: 70-80 grams Fluid: 1.2 liters  Patient reports recent poor intake due to abdominal pain.  From dietary recall, suspect patient is consuming < 75% of estimated energy requirements for at least the past 2 months.  Likes vanilla flavored supplements.  Drinks Nepro at HD center sometimes.    Pt meets criteria for severe malnutrition in the context of chronic illness as evidenced by intake < 75% of estimated energy requirements for at least the past 2 months, and 19% weight loss in 4 months.  NUTRITION DIAGNOSIS: -Malnutrition (NI-5.2).  Status: Ongoing  RELATED TO: abdominal pain resulting in poor oral intake  AS EVIDENCED BY: intake < 75% of estimated energy requirements for at least the past 2 months, and 19% weight loss in 4 months.  MONITORING/EVALUATION(Goals): Goal:  Intake to meet >90% of estimated nutrition needs to prevent further depletion of lean body mass. Monitor:  PO intake, labs, weight trend.  EDUCATION NEEDS: -Education not appropriate at this  time   Molli Barrows, RD, CNSC, [place] Pager# [phone] After Hours Pager# [phone]  08/22/2011, 10:31 AM

## 2011-08-23 ENCOUNTER — Inpatient Hospital Stay (HOSPITAL_COMMUNITY): Payer: Medicaid Other

## 2011-08-23 DIAGNOSIS — N186 End stage renal disease: Secondary | ICD-10-CM

## 2011-08-23 LAB — CBC
HCT: 32 % — ABNORMAL LOW (ref 36.0–46.0)
HCT: 30.4 % — ABNORMAL LOW (ref 36.0–46.0)
Hemoglobin: 9.8 g/dL — ABNORMAL LOW (ref 12.0–15.0)
Hemoglobin: 9.5 g/dL — ABNORMAL LOW (ref 12.0–15.0)
MCH: 30.8 pg (ref 26.0–34.0)
MCH: 31 pg (ref 26.0–34.0)
MCHC: 30.6 g/dL (ref 30.0–36.0)
MCHC: 31.3 g/dL (ref 30.0–36.0)
MCV: 100.6 fL — ABNORMAL HIGH (ref 78.0–100.0)
MCV: 99.3 fL (ref 78.0–100.0)
Platelets: 177 10*3/uL (ref 150–400)
Platelets: 176 10*3/uL (ref 150–400)
RBC: 3.18 MIL/uL — ABNORMAL LOW (ref 3.87–5.11)
RBC: 3.06 MIL/uL — ABNORMAL LOW (ref 3.87–5.11)
RDW: 16.4 % — ABNORMAL HIGH (ref 11.5–15.5)
RDW: 16.3 % — ABNORMAL HIGH (ref 11.5–15.5)
WBC: 3.2 10*3/uL — ABNORMAL LOW (ref 4.0–10.5)
WBC: 3.5 10*3/uL — ABNORMAL LOW (ref 4.0–10.5)

## 2011-08-23 LAB — RENAL FUNCTION PANEL
Albumin: 2 g/dL — ABNORMAL LOW (ref 3.5–5.2)
Albumin: 1.9 g/dL — ABNORMAL LOW (ref 3.5–5.2)
BUN: 32 mg/dL — ABNORMAL HIGH (ref 6–23)
BUN: 34 mg/dL — ABNORMAL HIGH (ref 6–23)
CO2: 27 mEq/L (ref 19–32)
CO2: 27 mEq/L (ref 19–32)
Calcium: 8.4 mg/dL (ref 8.4–10.5)
Calcium: 8.3 mg/dL — ABNORMAL LOW (ref 8.4–10.5)
Chloride: 99 mEq/L (ref 96–112)
Chloride: 100 mEq/L (ref 96–112)
Creatinine, Ser: 5.4 mg/dL — ABNORMAL HIGH (ref 0.50–1.10)
Creatinine, Ser: 5.47 mg/dL — ABNORMAL HIGH (ref 0.50–1.10)
GFR calc Af Amer: 9 mL/min — ABNORMAL LOW (ref >90)
GFR calc Af Amer: 9 mL/min — ABNORMAL LOW (ref >90)
GFR calc non Af Amer: 8 mL/min — ABNORMAL LOW (ref >90)
GFR calc non Af Amer: 8 mL/min — ABNORMAL LOW (ref >90)
Glucose, Bld: 87 mg/dL (ref 70–99)
Glucose, Bld: 105 mg/dL — ABNORMAL HIGH (ref 70–99)
Phosphorus: 3.1 mg/dL (ref 2.3–4.6)
Phosphorus: 2.9 mg/dL (ref 2.3–4.6)
Potassium: 3.9 mEq/L (ref 3.5–5.1)
Potassium: 3.8 mEq/L (ref 3.5–5.1)
Sodium: 134 mEq/L — ABNORMAL LOW (ref 135–145)
Sodium: 135 mEq/L (ref 135–145)

## 2011-08-23 LAB — GLUCOSE, CAPILLARY
Glucose-Capillary: 63 mg/dL — ABNORMAL LOW (ref 70–99)
Glucose-Capillary: 97 mg/dL (ref 70–99)

## 2011-08-23 MED ORDER — MIDODRINE HCL 5 MG PO TABS
10.0000 mg | ORAL_TABLET | Freq: Two times a day (BID) | ORAL | Status: DC
  Filled 2011-08-23 (×2): qty 2

## 2011-08-23 MED ORDER — DARBEPOETIN ALFA-POLYSORBATE 100 MCG/0.5ML IJ SOLN
INTRAMUSCULAR | Status: AC
  Filled 2011-08-23: qty 0.5

## 2011-08-23 MED ORDER — MIDODRINE HCL 5 MG PO TABS: 10.0000 mg | ORAL_TABLET | Freq: Two times a day (BID) | ORAL | Status: DC

## 2011-08-23 MED ORDER — MIDODRINE HCL 5 MG PO TABS
10.0000 mg | ORAL_TABLET | Freq: Two times a day (BID) | ORAL | Status: DC
  Administered 2011-08-23 – 2011-09-02 (×19): 10 mg via ORAL
  Filled 2011-08-23 (×22): qty 2

## 2011-08-23 NOTE — Procedures (Signed)
I was present at this dialysis session. I have reviewed the session itself and made appropriate changes.   Kelly Splinter, MD [employer] 08/23/2011, 7:42 PM

## 2011-08-23 NOTE — Plan of Care (Signed)
Problem: Phase I Progression Outcomes Goal: Voiding-avoid urinary catheter unless indicated Outcome: Not Applicable Date Met:  0000000 Pt is anuric

## 2011-08-23 NOTE — Progress Notes (Addendum)
Procedure Note  Removal right IJ diatek catheter 1% lidocaine 7 cc Catheter freed bluntly No complications  Tip sent for culture  Please call when ready for new catheter  Ruta Hinds, MD [facility] Office: [phone] Pager: [phone]

## 2011-08-23 NOTE — Progress Notes (Addendum)
Subjective: Pt reports diarrhea this am, denies ab pain. She wants to order food from the cafeteria. She denies other concerns.    Objective: Vital signs in last 24 hours: Filed Vitals:   08/23/11 1030 08/23/11 1100 08/23/11 1130 08/23/11 1200  BP: 94/67 93/61 90/61  91/62  Pulse: 77 84 77 78  Temp:      TempSrc:      Resp: 20 19 18 23   Height:      Weight:      SpO2:       Weight change:   Intake/Output Summary (Last 24 hours) at 08/23/11 1217 Last data filed at 08/23/11 0800  Gross per 24 hour  Intake    730 ml  Output      2 ml  Net    728 ml   Vitals reviewed. General: resting in bed in HD, NAD HEENT: [place]/AT, no scleral icterus Cardiac: RRR, +systolic murmer; right chest HD access Pulm: clear to auscultation bilaterally, no wheezes, rales, or rhonchi Abd: soft, nontender, nondistended, BS present (normal) Ext: warm and well perfused, left lower leg 2+edema, right lower leg 1+edema Neuro: alert and oriented X3  Lab Results: Basic Metabolic Panel:  Lab 0000000 0910 08/23/11 0650 08/22/11 0100  NA 135 134* --  K 3.8 3.9 --  CL 100 99 --  CO2 27 27 --  GLUCOSE 105* 87 --  BUN 34* 32* --  CREATININE 5.47* 5.40* --  CALCIUM 8.3* 8.4 --  MG -- -- 1.7  PHOS 2.9 3.1 --   Liver Function Tests:  Lab 08/23/11 0910 08/23/11 0650 08/21/11 2342  AST -- -- 26  ALT -- -- 5  ALKPHOS -- -- 120*  BILITOT -- -- 0.3  PROT -- -- 8.9*  ALBUMIN 1.9* 2.0* --    Lab 08/22/11 0100  LIPASE 40  AMYLASE --   CBC:  Lab 08/23/11 0910 08/23/11 0650 08/21/11 2342  WBC 3.5* 3.2* --  NEUTROABS -- -- 1.6*  HGB 9.5* 9.8* --  HCT 30.4* 32.0* --  MCV 99.3 100.6* --  PLT 176 177 --   CBG:  Lab 08/22/11 0503  GLUCAP 72     Misc. Labs: +C. dificile  +blood cultures x 2   Micro Results: Recent Results (from the past 240 hour(s))  CULTURE, BLOOD (ROUTINE X 2)     Status: Normal (Preliminary result)   Collection Time   08/21/11 11:35 PM      Component Value Range Status  Comment   Specimen Description BLOOD LEFT ARM   Final    Special Requests BOTTLES DRAWN AEROBIC AND ANAEROBIC 5ML   Final    Culture  Setup Time 08/22/2011 04:58   Final    Culture     Final    Value: GRAM POSITIVE COCCI IN PAIRS     Note: Gram Stain Report Called to,Read Back By and Verified With: KIMBERLY PORTER @ 2150 ON 08/22/11 BY GOLLD   Report Status PENDING   Incomplete   CULTURE, BLOOD (ROUTINE X 2)     Status: Normal (Preliminary result)   Collection Time   08/21/11 11:59 PM      Component Value Range Status Comment   Specimen Description BLOOD LEFT HAND   Final    Special Requests BOTTLES DRAWN AEROBIC AND ANAEROBIC 5CC   Final    Culture  Setup Time 08/22/2011 04:59   Final    Culture     Final    Value: GRAM POSITIVE COCCI IN PAIRS  Note: Gram Stain Report Called to,Read Back By and Verified With: KIMBERLY PORTER @ 2150 ON 08/22/11 BY GOLLD   Report Status PENDING   Incomplete   MRSA PCR SCREENING     Status: Normal   Collection Time   08/22/11  5:24 AM      Component Value Range Status Comment   MRSA by PCR NEGATIVE  NEGATIVE Final   CLOSTRIDIUM DIFFICILE BY PCR     Status: Abnormal   Collection Time   08/22/11 10:48 AM      Component Value Range Status Comment   C difficile by pcr POSITIVE (*) NEGATIVE Final    Studies/Results: Dg Abd Acute W/chest  08/22/2011  *RADIOLOGY REPORT*  Clinical Data: Abdominal pain  ACUTE ABDOMEN SERIES (ABDOMEN 2 VIEW & CHEST 1 VIEW)  Comparison: 07/11/2011 and earlier studies  Findings: Right IJ tunneled hemodialysis catheter extends to the proximal right atrium.  Stable mild cardiomegaly.  Vascular clips in the left axilla.  Lungs are clear.  No effusion.  No free air. Nonobstructive bowel gas pattern.  Left pelvic phlebolith. Regional bones unremarkable.  IMPRESSION:  1.  Stable mild cardiomegaly. 2.  Normal bowel gas pattern.  No free air.  Original Report Authenticated By: Trecia Rogers, M.D.   Medications:  Scheduled Meds:      . aspirin  81 mg Oral Daily  . cefTRIAXone (ROCEPHIN)  IV  1 g Intravenous Q24H  . darbepoetin      . darbepoetin  100 mcg Intravenous Q7 days  . darunavir  800 mg Oral Q breakfast  . feeding supplement (NEPRO CARB STEADY)  237 mL Oral TID BM  . lamiVUDine  50 mg Oral Daily  . midodrine  10 mg Oral BID WC  . multivitamin  1 tablet Oral Daily  . pantoprazole  20 mg Oral Q1200  . paricalcitol  4 mcg Intravenous 3 times weekly  . ritonavir  100 mg Oral Q breakfast  . sodium chloride  3 mL Intravenous Q12H  . stavudine  20 mg Oral Daily  . vancomycin  125 mg Oral QID  . vancomycin  500 mg Intravenous Q T,Th,Sa-HD  . vancomycin  1,000 mg Intravenous NOW  . DISCONTD: darbepoetin  100 mcg Intravenous Q7 days  . DISCONTD: darbepoetin  150 mcg Subcutaneous Q7 days  . 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 Diff (07/2011), h/o bacteriuria (VRE), MGUS, bacteremia with MSSA (05/2011), anemia, pancytopenia presents with fever and ab pain.    1. Fever -resolved currently -T max 102. 9, now afebrile -etiology related to + C diff, h/o C. Diff 07/12/2011 and possibly d/t bacteremia +blood cultures x 2 gm +cocci in pairs   2. Possible bacteremia -2 blood Cxs aerobic anaerobic + gm +cocci in pairs -Added Rocephin 1 g q24 hours iv and Vancomycin iv 500 mg qTThsSat in HD -05/18/11 blood cx aerobic/anaerobic grew staph aureus and coag neg staph  -prev echo 05/2011 EF 40-45%, mild to mod reduced systolic function, mod LVH, mild MR, mild AR, +chordae tendineae related small  bright mobile area which vegetation could not be excluded -Abx history from May 2013 from [place]: Zosyn5/2013 x  1 dose, Vanc 05/18/11 x 1 dose and Ancef 05/26/11 x 1 dose; records from [facility]) documented prev MSSA bacteremia 5/13 tx'ed with ancef but unclear duration and start date.  -ordered repeat TTE; depending on results consider cards consult for TEE -ID already following will make aware for tx recs -renal and VVS following pt will have HD cath removed today after HD which needs to be sent for culture   3. C.diff -isolation precautions and protocol -Rx oral Vancomycin 125 mg qid (since 8/16) will tx x 14 days per total Dr. Tommy Medal  4. HIV INFECTION  -pending CD4 and viral ct -last CD4 ct 210 07/2011 and 07/2011 Viral load 4783 -restarted home meds (Norvir, Epivir, Prezista,  -ID following (Dr. Wilder Glade or Johnnye Sima)   5. ESRD (end stage renal disease) (03/19/2007) -renal consulted  -Cr. 5.47 today baseline 3-4 range  -HD TTHSat -d/t bacteremia will pull current HD access chest, and have HD catheter replaced early next week.     6. ANEMIA OF CHRONIC DISEASE -Aransep ordered but d/c'ed in setting of infection   7. Pancytopenia  -chronic -will monitor cbc   8. DVT Px -Hep/SCDs -protonix   9. F/E/N -renal following electrolytes -diet ordered   10. Dispo -transfer to tele bed today -d/c when w/u complete  Code status:per shadow chart [facility] notes pt is DNR  LOS: 2 days   Cresenciano Genre [id number] 08/23/2011, 12:17 PM

## 2011-08-23 NOTE — Progress Notes (Signed)
  Echocardiogram 2D Echocardiogram has been performed.  Alvin Critchley 08/23/2011, 3:11 PM

## 2011-08-23 NOTE — Progress Notes (Signed)
Asked by Renal Service to pull dialysis catheter for presumed infection.  Currently on dialysis Will return later today or in morning to pull catheter  Ruta Hinds, MD [facility] [place]: [phone] Pager: [phone]

## 2011-08-23 NOTE — Progress Notes (Signed)
Report called to Claiborne Rigg on unit 6700 pt transferred to room 6711 with belongings ,meds and chart. No s/s pain or distress.[facility] [employer]

## 2011-08-23 NOTE — Progress Notes (Signed)
Pt received from 2600. Pt remains stable. Pt alert and oriented to room. Bed low and call light within reach. [place], Franky Macho

## 2011-08-23 NOTE — Progress Notes (Signed)
Subjective: Alert, no complaints. Both blood cultures are positive for GPC  Objective Vital signs in last 24 hours: Filed Vitals:   08/22/11 1700 08/22/11 2037 08/23/11 0036 08/23/11 0420  BP: 125/67 93/63 121/69 132/74  Pulse: 84 79 74 74  Temp:  99.8 F (37.7 C) 99.2 F (37.3 C) 98.8 F (37.1 C)  TempSrc:  Oral Oral Oral  Resp: 15 20 16 15   Height:      Weight:      SpO2: 100% 100% 100% 99%   Weight change:   Intake/Output Summary (Last 24 hours) at 08/23/11 0757 Last data filed at 08/23/11 0600  Gross per 24 hour  Intake    930 ml  Output      4 ml  Net    926 ml   Labs: Basic Metabolic Panel:  Lab 0000000 0650 08/22/11 0100 08/21/11 2342  NA 134* -- 137  K 3.9 -- 3.0*  CL 99 -- 102  CO2 27 -- 27  GLUCOSE 87 -- 89  BUN 32* -- 15  CREATININE 5.40* -- 3.69*  ALB -- -- --  CALCIUM 8.4 -- 7.7*  PHOS 3.1 1.9* --   Liver Function Tests:  Lab 08/23/11 0650 08/21/11 2342  AST -- 26  ALT -- 5  ALKPHOS -- 120*  BILITOT -- 0.3  PROT -- 8.9*  ALBUMIN 2.0* 2.0*    Lab 08/22/11 0100  LIPASE 40  AMYLASE --   No results found for this basename: AMMONIA:3 in the last 168 hours CBC:  Lab 08/23/11 0650 08/21/11 2342  WBC 3.2* 2.8*  NEUTROABS -- 1.6*  HGB 9.8* 11.2*  HCT 32.0* 35.6*  MCV 100.6* 99.2  PLT 177 146*   PT/INR: @labrcntip (inr:5) Cardiac Enzymes: No results found for this basename: CKTOTAL:5,CKMB:5,CKMBINDEX:5,TROPONINI:5 in the last 168 hours CBG:  Lab 08/22/11 0503  GLUCAP 72    Iron Studies: No results found for this basename: IRON:30,TIBC:30,TRANSFERRIN:30,FERRITIN:30 in the last 168 hours  Physical Exam:  Blood pressure 132/74, pulse 74, temperature 98.8 F (37.1 C), temperature source Oral, resp. rate 15, height 5\' 3"  (1.6 m), weight 55.2 kg (121 lb 11.1 oz), SpO2 99.00%.  Gen: alert, no distress, calm  Skin: no rash, cyanosis  HEENT: EOMI, sclera anicteric, throat clear  Neck: no JVD, no bruits or LAN  Chest: clear bilat, no  rales or rhonchi  Heart: regular, no rub or gallop, soft SEM  Abdomen: soft, nontender, liver down 3cm below RCM, no ascites  Ext: R chest HD cath exit site is clean w/ small amt crusted material, no pus or erythema. Tunnel nontender and unremarkable  Neuro: alert, Ox3, no focal deficit, no asterixis   Outpatient HD: [facility], 4 hrs, 54 kg EDW. Hep 5900u. EPO 13,000u. Zemplar 4ug. R IJ TDcath. Nepro 1 can each HD. 2K/2.25Ca bath.   Impression/Plan  1. Fever, abd pain- recent hx of endocarditis and MSSA bacteremia in May. Blood cultures are + 2/2 for GPC in pairs.  Will need catheter removal; will ask VVS to remove after HD today. Will need cath replacment early next week then.  2. ESRD TTS at [facility]; continue usual schedule. BP 118/70, over dry weight by 1-2kg. 3. Hypotension/volume- takes metoprolol 12.5 qhs presumably for cardiomyopathy, and takes midodrine 10 mg bid at home . Metoprolol is on hold, will resume midodrine.  4. CM, EF 40-45% 5. HIV- on ART 6. Hx MSSA endocarditis May 2013 7. End-stage access- no further perm access I am told 8. Anemia of CKD-  cont ESA. Not on IV iron. 9. Metabolic bone disease- cont Zemplar. Not on binders. Last PTH 225, at goal.     [name] Splinter  MD [facility] [phone] pgr    [phone] cell 08/23/2011, 7:57 AM

## 2011-08-24 DIAGNOSIS — R509 Fever, unspecified: Secondary | ICD-10-CM

## 2011-08-24 LAB — CBC WITH DIFFERENTIAL/PLATELET
Basophils Absolute: 0 10*3/uL (ref 0.0–0.1)
Basophils Relative: 1 % (ref 0–1)
Eosinophils Absolute: 0 10*3/uL (ref 0.0–0.7)
Eosinophils Relative: 1 % (ref 0–5)
HCT: 33.7 % — ABNORMAL LOW (ref 36.0–46.0)
Hemoglobin: 10.2 g/dL — ABNORMAL LOW (ref 12.0–15.0)
Lymphocytes Relative: 37 % (ref 12–46)
Lymphs Abs: 1.1 10*3/uL (ref 0.7–4.0)
MCH: 30.8 pg (ref 26.0–34.0)
MCHC: 30.3 g/dL (ref 30.0–36.0)
MCV: 101.8 fL — ABNORMAL HIGH (ref 78.0–100.0)
Monocytes Absolute: 0.3 10*3/uL (ref 0.1–1.0)
Monocytes Relative: 10 % (ref 3–12)
Neutro Abs: 1.5 10*3/uL — ABNORMAL LOW (ref 1.7–7.7)
Neutrophils Relative %: 52 % (ref 43–77)
Platelets: 184 10*3/uL (ref 150–400)
RBC: 3.31 MIL/uL — ABNORMAL LOW (ref 3.87–5.11)
RDW: 16.6 % — ABNORMAL HIGH (ref 11.5–15.5)
WBC: 2.9 10*3/uL — ABNORMAL LOW (ref 4.0–10.5)

## 2011-08-24 LAB — RENAL FUNCTION PANEL
Albumin: 1.9 g/dL — ABNORMAL LOW (ref 3.5–5.2)
BUN: 16 mg/dL (ref 6–23)
CO2: 29 mEq/L (ref 19–32)
Calcium: 8.6 mg/dL (ref 8.4–10.5)
Chloride: 100 mEq/L (ref 96–112)
Creatinine, Ser: 3.44 mg/dL — ABNORMAL HIGH (ref 0.50–1.10)
GFR calc Af Amer: 16 mL/min — ABNORMAL LOW (ref >90)
GFR calc non Af Amer: 14 mL/min — ABNORMAL LOW (ref >90)
Glucose, Bld: 74 mg/dL (ref 70–99)
Phosphorus: 2.7 mg/dL (ref 2.3–4.6)
Potassium: 4.3 mEq/L (ref 3.5–5.1)
Sodium: 135 mEq/L (ref 135–145)

## 2011-08-24 MED ORDER — SODIUM CHLORIDE 0.9 % IV SOLN
INTRAVENOUS | Status: AC
  Administered 2011-08-24: 20:00:00 via INTRAVENOUS

## 2011-08-24 MED ORDER — ENSURE COMPLETE PO LIQD
237.0000 mL | Freq: Two times a day (BID) | ORAL | Status: DC
  Administered 2011-08-24 – 2011-09-02 (×14): 237 mL via ORAL

## 2011-08-24 MED ORDER — LINEZOLID 2 MG/ML IV SOLN
600.0000 mg | Freq: Two times a day (BID) | INTRAVENOUS | Status: DC
  Administered 2011-08-24 – 2011-08-27 (×6): 600 mg via INTRAVENOUS
  Filled 2011-08-24 (×10): qty 300

## 2011-08-24 NOTE — Progress Notes (Signed)
Subjective: States she is doing well today.  Denies pain, SOB, diarrhea, constipation, or swelling.  Catheter was pulled yesterday after dialysis.  No fevers overnight.  Eating well and slept okay. Has not been out of bed except to the bedside commode.   Objective: Vital signs in last 24 hours: Filed Vitals:   08/23/11 1459 08/23/11 1521 08/23/11 1726 08/24/11 0545  BP: 81/53 94/55 96/58  112/74  Pulse: 59 79 75 64  Temp: 98.6 F (37 C) 99.4 F (37.4 C) 100.2 F (37.9 C) 99 F (37.2 C)  TempSrc: Oral Oral Oral Oral  Resp: 17 17 18 18   Height:  5\' 3"  (1.6 m)    Weight:  108 lb 7.5 oz (49.2 kg)    SpO2: 99% 96% 100% 100%   Weight change:   Intake/Output Summary (Last 24 hours) at 08/24/11 1045 Last data filed at 08/24/11 0245  Gross per 24 hour  Intake    350 ml  Output   2001 ml  Net  -1651 ml   Vitals reviewed. General: resting in bed, NAD HEENT: PERRL, EOMI, no scleral icterus Cardiac: RRR, no rubs, murmurs or gallops Pulm: mild bibasilar crackles noted. no wheezes, or rhonchi Abd: soft, nontender, nondistended, BS present Ext: warm and well perfused, trace pedal edema Neuro: alert and oriented X3, cranial nerves II-XII grossly intact, strength and sensation to light touch equal in bilateral upper and lower extremities Skin: Previous Catheter site is non tender with no bleeding noted or erythema.   Lab Results: Basic Metabolic Panel:  Lab [id number] 0535 08/23/11 0910 08/22/11 0100  NA 135 135 --  K 4.3 3.8 --  CL 100 100 --  CO2 29 27 --  GLUCOSE 74 105* --  BUN 16 34* --  CREATININE 3.44* 5.47* --  CALCIUM 8.6 8.3* --  MG -- -- 1.7  PHOS 2.7 2.9 --   Liver Function Tests:  Lab 08/24/11 0535 08/23/11 0910 08/21/11 2342  AST -- -- 26  ALT -- -- 5  ALKPHOS -- -- 120*  BILITOT -- -- 0.3  PROT -- -- 8.9*  ALBUMIN 1.9* 1.9* --    Lab 08/22/11 0100  LIPASE 40  AMYLASE --   CBC:  Lab 08/24/11 0535 08/23/11 0910 08/21/11 2342  WBC 2.9* 3.5* --    NEUTROABS 1.5* -- 1.6*  HGB 10.2* 9.5* --  HCT 33.7* 30.4* --  MCV 101.8* 99.3 --  PLT 184 176 --   CBG:  Lab 08/23/11 1423 08/23/11 1341 08/22/11 0503  GLUCAP 97 63* 72   Micro Results: Recent Results (from the past 240 hour(s))  CULTURE, BLOOD (ROUTINE X 2)     Status: Normal (Preliminary result)   Collection Time   08/21/11 11:35 PM      Component Value Range Status Comment   Specimen Description BLOOD LEFT ARM   Final    Special Requests BOTTLES DRAWN AEROBIC AND ANAEROBIC 5ML   Final    Culture  Setup Time 08/22/2011 04:58   Final    Culture     Final    Value: ENTEROCOCCUS SPECIES     Note: Gram Stain Report Called to,Read Back By and Verified With: KIMBERLY PORTER @ 2150 ON 08/22/11 BY GOLLD   Report Status PENDING   Incomplete    Organism ID, Bacteria ENTEROCOCCUS SPECIES   Final   CULTURE, BLOOD (ROUTINE X 2)     Status: Normal (Preliminary result)   Collection Time   08/21/11 11:59 PM  Component Value Range Status Comment   Specimen Description BLOOD LEFT HAND   Final    Special Requests BOTTLES DRAWN AEROBIC AND ANAEROBIC 5CC   Final    Culture  Setup Time 08/22/2011 04:59   Final    Culture     Final    Value: ENTEROCOCCUS SPECIES     Note: Gram Stain Report Called to,Read Back By and Verified With: KIMBERLY PORTER @ 2150 ON 08/22/11 BY GOLLD   Report Status PENDING   Incomplete   MRSA PCR SCREENING     Status: Normal   Collection Time   08/22/11  5:24 AM      Component Value Range Status Comment   MRSA by PCR NEGATIVE  NEGATIVE Final   CLOSTRIDIUM DIFFICILE BY PCR     Status: Abnormal   Collection Time   08/22/11 10:48 AM      Component Value Range Status Comment   C difficile by pcr POSITIVE (*) NEGATIVE Final   CULTURE, BLOOD (ROUTINE X 2)     Status: Normal (Preliminary result)   Collection Time   08/22/11 11:15 PM      Component Value Range Status Comment   Specimen Description BLOOD RIGHT ARM   Final    Special Requests BOTTLES DRAWN AEROBIC AND  ANAEROBIC 10CC   Final    Culture  Setup Time 08/23/2011 05:11   Final    Culture     Final    Value: GRAM POSITIVE COCCI IN CLUSTERS     Note: Gram Stain Report Called to,Read Back By and Verified With: RN K. GENGLER ON 08/23/11 AT 2145 BY DTERRY   Report Status PENDING   Incomplete   CULTURE, BLOOD (ROUTINE X 2)     Status: Normal (Preliminary result)   Collection Time   08/22/11 11:20 PM      Component Value Range Status Comment   Specimen Description BLOOD RIGHT ARM   Final    Special Requests BOTTLES DRAWN AEROBIC AND ANAEROBIC 10CC   Final    Culture  Setup Time 08/23/2011 05:11   Final    Culture     Final    Value: GRAM POSITIVE COCCI IN CLUSTERS     Note: Gram Stain Report Called to,Read Back By and Verified With: RN K. GENGLER ON 08/23/11 AT 2145 BY DTERRY   Report Status PENDING   Incomplete   CATH TIP CULTURE     Status: Normal (Preliminary result)   Collection Time   08/23/11  2:19 PM      Component Value Range Status Comment   Specimen Description CATH TIP   Final    Special Requests NONE   Final    Culture Culture reincubated for better growth   Final    Report Status PENDING   Incomplete    Studies/Results: 2D Echo done with read pending.  Medications: I have reviewed the patient's current medications. Scheduled Meds:   . aspirin  81 mg Oral Daily  . cefTRIAXone (ROCEPHIN)  IV  1 g Intravenous Q24H  . darbepoetin      . darbepoetin  100 mcg Intravenous Q7 days  . darunavir  800 mg Oral Q breakfast  . feeding supplement  237 mL Oral BID BM  . lamiVUDine  50 mg Oral Daily  . midodrine  10 mg Oral BID WC  . multivitamin  1 tablet Oral Daily  . pantoprazole  20 mg Oral Q1200  . paricalcitol  4 mcg Intravenous 3 times weekly  .  ritonavir  100 mg Oral Q breakfast  . sodium chloride  3 mL Intravenous Q12H  . stavudine  20 mg Oral Daily  . vancomycin  125 mg Oral QID  . vancomycin  500 mg Intravenous Q T,Th,Sa-HD  . DISCONTD: feeding supplement (NEPRO CARB STEADY)   237 mL Oral TID BM  . DISCONTD: midodrine  10 mg Oral BID  . 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 Diff (07/2011), h/o bacteriuria (VRE), MGUS, bacteremia with MSSA (05/2011), anemia, pancytopenia presents with fever and ab pain.   1. Bacteremia: on admission Ms. Bozard had a fever of 102.5 which has resolved since then.  Tmax yesterday was 100.2.  She has several etiologies possible including C. Diff colitis, catheter associated infections, or possible endocarditis.  She has had two blood cultures grow gram pos cocci in clusters that were speciated to Enterococccus today.  Full sensitivities are pending but they are resistant to ampicillin.  She is currently on Vanc and Rocephin.  Abx history from May 2013 from [place]: Zosyn5/2013 x 1 dose, Vanc 05/18/11 x 1 dose and Ancef 05/26/11 x 1 dose; records from [facility]) documented prev MSSA bacteremia 5/13 tx'ed with ancef but unclear duration and start date.   - Awaits sensitivities  - Continue Vanc and Rocephin for today.   - Waiting for 2D echo read.  If negative may also need TEE to rule out endocarditis.    - dialysis catheter was removed yesterday following dialysis with plans to reinsert on   Tuesday prior to dialysis.    2. C.diff:  She has had C. Diff in the past and was treated.  She continues to have diarrhea and her C. Diff was positive on this admission.  She is getting oral Vanc per the protocol for a total of 14 days.  She may need continued treatment with the likely need for long term antibiotics for #1.    -isolation precautions and protocol   -Rx oral Vancomycin 125 mg qid (since 8/16) will tx x 14 days per total Dr. Tommy Medal   3. HIV INFECTION:  Has a history of non-compliance with her HIV meds but recently has done better with taking them as evidenced by a decreasing viral load as well as increasing CD4 count. Last CD4 ct 210 07/2011 and 07/2011 Viral load 4783.  She is on her current home medications including Norvir, Epivir, Prezista.  ID is following (Dr. Wilder Glade or Johnnye Sima).   4. ESRD (end stage renal disease) (03/19/2007):  Renal is following and dialyzing on her home schedule of T,Th, Sat. Access has been a problem but we have a plan in place to replace her dialysis cath on Tuesday after a weekend of ABX and no line.   5. ANEMIA OF CHRONIC DISEASE  -Aransep ordered but d/c'ed in setting of infection   6. Pancytopenia  -chronic  -will monitor cbc   7. DVT Px  -Hep/SCDs  -protonix   8. F/E/N  -renal following electrolytes  -diet ordered   9. Dispo: Likely Tuesday after dialysis with antibiotics based on what is found on the workup.     LOS: 3 days   [name]  08/24/2011, 10:45 AM

## 2011-08-24 NOTE — Progress Notes (Signed)
[facility] Progress Note  Subjective: Having loose stools.  Likes Ensure Plus. Dialysis catheter out yesterday.  Objective Filed Vitals:   08/23/11 1459 08/23/11 1521 08/23/11 1726 08/24/11 0545  BP: 81/53 94/55 96/58  112/74  Pulse: 59 79 75 64  Temp: 98.6 F (37 C) 99.4 F (37.4 C) 100.2 F (37.9 C) 99 F (37.2 C)  TempSrc: Oral Oral Oral Oral  Resp: 17 17 18 18   Height:  5\' 3"  (1.6 m)    Weight:  49.2 kg (108 lb 7.5 oz)    SpO2: 99% 96% 100% 100%   Physical Exam General:Sitting in bed. NAD getting ready to eat. Heart: RRR II/VI murmur Lungs: crackles at bases bilaterally Abdomen: soft NT Extremities: bilateral SCDs, tr  LE edema Dialysis Access: none - multiple failed access; catheter removed yesterday.  Outpatient HD: [place] TTS, 4 hrs, 54 kg EDW. Hep 5900u. EPO 13,000u. Zemplar 4ug. R IJ TDcath. Nepro 1 can each HD. 2K/2.25Ca bath.   Impression/Plan  1. Blood cultures are + 2/2 for GPC in clusters; hx of endocarditis and MSSA bacteremia in May  Perm catheter removed 8/17; tip sent for  C & S - no growth pending.  Will need cath replacment Tuesday am. On vancomycin and ceftriaxone. Have left a message for Bethena Roys at  [facility] to schedule for Tuesday am. 2. ESRD TTS at [facility]; HD 8/17. Net UF 2 liters - there doesn't appear to have been a pre HD wt for 8/17  (wt 55 8/16 am) 3. Hypotension/volume- takes metoprolol 12.5 qhs presumably for cardiomyopathy, and takes midodrine 10 mg bid at home . Metoprolol is on hold. Midodrine resumed.  Crackles at bases, O2 sats fine.  4. CM, EF 40-45% 5. HIV- continue current antiretroviral tx, ANC 1.5 6. End-stage access- no further perm access - needs another perm cath 7. Anemia of CKD- cont Aranesp 100. Not on IV iron. Hgb stable 10.2 8/18 8. Metabolic bone disease- cont Zemplar. Not on binders. P slightly low Last PTH 225, at goal.  9. Malnutrition - increased to high protein renal diet.  Change supplement to Ensure Plus.  This is  her preference.  It is higher in K, than nepro, so need to watch.  Myriam Jacobson, PA-C [place]  08/24/2011,8:24 AM  LOS: 3 days   Patient seen and examined and agree with assessment and plan as above.  Kelly Splinter  MD [facility] [phone] pgr    [phone] cell 08/24/2011, 12:50 PM   Additional Objective Labs: Basic Metabolic Panel:  Lab [id number] 0535 08/23/11 0910 08/23/11 0650  NA 135 135 134*  K 4.3 3.8 3.9  CL 100 100 99  CO2 29 27 27   GLUCOSE 74 105* 87  BUN 16 34* 32*  CREATININE 3.44* 5.47* 5.40*  CALCIUM 8.6 8.3* 8.4  ALB -- -- --  PHOS 2.7 2.9 3.1   Liver Function Tests:  Lab 08/24/11 0535 08/23/11 0910 08/23/11 0650 08/21/11 2342  AST -- -- -- 26  ALT -- -- -- 5  ALKPHOS -- -- -- 120*  BILITOT -- -- -- 0.3  PROT -- -- -- 8.9*  ALBUMIN 1.9* 1.9* 2.0* --    Lab 08/22/11 0100  LIPASE 40  AMYLASE --   CBC:  Lab 08/24/11 0535 08/23/11 0910 08/23/11 0650 08/21/11 2342  WBC 2.9* 3.5* 3.2* --  NEUTROABS 1.5* -- -- 1.6*  HGB 10.2* 9.5* 9.8* --  HCT 33.7* 30.4* 32.0* --  MCV 101.8*  99.3 100.6* 99.2  PLT 184 176 177 --   Blood Culture    Component Value Date/Time   SDES CATH TIP 08/23/2011 1419   SPECREQUEST NONE 08/23/2011 1419   CULT Culture reincubated for better growth 08/23/2011 1419   REPTSTATUS PENDING 08/23/2011 1419  CBG:  Lab 08/23/11 1423 08/23/11 1341 08/22/11 0503  GLUCAP 97 63* 72  Medications:      . aspirin  81 mg Oral Daily  . cefTRIAXone (ROCEPHIN)  IV  1 g Intravenous Q24H  . darbepoetin      . darbepoetin  100 mcg Intravenous Q7 days  . darunavir  800 mg Oral Q breakfast  . feeding supplement (NEPRO CARB STEADY)  237 mL Oral TID BM  . lamiVUDine  50 mg Oral Daily  . midodrine  10 mg Oral BID WC  . multivitamin  1 tablet Oral Daily  . pantoprazole  20 mg Oral Q1200  . paricalcitol  4 mcg Intravenous 3 times weekly  . ritonavir  100 mg Oral Q breakfast  . sodium  chloride  3 mL Intravenous Q12H  . stavudine  20 mg Oral Daily  . vancomycin  125 mg Oral QID  . vancomycin  500 mg Intravenous Q T,Th,Sa-HD  . DISCONTD: midodrine  10 mg Oral BID  . DISCONTD: midodrine  10 mg Oral BID WC

## 2011-08-24 NOTE — Progress Notes (Signed)
Patient has been SB/NSR on telemetry through out the night.  At 0536, her rhythm appeared to be Aflutter but went back into NSR.  No c/o chest pain and VS stable.  Dr. Algis Liming notified and no new orders given.  Will continue to monitor patient.  Linus Galas  08/24/2011

## 2011-08-24 NOTE — Progress Notes (Signed)
INFECTIOUS DISEASE PROGRESS NOTE  ID: [name] is a 56 y.o. female with  Principal Problem:  *Bacteremia Active Problems:  HIV INFECTION  ANEMIA OF CHRONIC DISEASE  Pancytopenia  ESRD (end stage renal disease)  Protein calorie malnutrition  C. difficile colitis  Hypotension  Subjective: 56 yo F with HIV+, MSSA endocarditis secondary to left IJ catheter infection on 05/2011 and C.diff on 07/2011 who presented 8-16 with abdominal pain and fever. She has been found to have C diff, have recurrent bacteremia. Her HD cath was pulled 8-17.  Cont loose BM. Otherwise without complaints.   Abtx:  Anti-infectives     Start     Dose/Rate Route Frequency Ordered Stop   08/23/11 1200   vancomycin (VANCOCIN) 500 mg in sodium chloride 0.9 % 100 mL IVPB        500 mg 100 mL/hr over 60 Minutes Intravenous Every T-Th-Sa (Hemodialysis) 08/22/11 2326     08/22/11 2359   cefTRIAXone (ROCEPHIN) 1 g in dextrose 5 % 50 mL IVPB        1 g 100 mL/hr over 30 Minutes Intravenous Every 24 hours 08/22/11 2326     08/22/11 2330   vancomycin (VANCOCIN) IVPB 1000 mg/200 mL premix        1,000 mg 200 mL/hr over 60 Minutes Intravenous NOW 08/22/11 2326 08/23/11 0050   08/22/11 1530   vancomycin (VANCOCIN) 50 mg/mL oral solution 125 mg        125 mg Oral 4 times daily 08/22/11 1519 09/05/11 1359   08/22/11 1000   lamiVUDine (EPIVIR) 10 MG/ML solution 50 mg        50 mg Oral Daily 08/22/11 0509     08/22/11 1000   Stavudine SOLR 20 mg  Status:  Discontinued        20 mL Oral Daily 08/22/11 0509 08/22/11 0520   08/22/11 1000   stavudine (ZERIT) capsule 20 mg        20 mg Oral Daily 08/22/11 0521     08/22/11 0800   Darunavir Ethanolate (PREZISTA) tablet 800 mg        800 mg Oral Daily with breakfast 08/22/11 0509     08/22/11 0800   ritonavir (NORVIR) tablet 100 mg        100 mg Oral Daily with breakfast 08/22/11 0509            Medications:  Scheduled:   . aspirin  81 mg Oral Daily  .  cefTRIAXone (ROCEPHIN)  IV  1 g Intravenous Q24H  . darbepoetin      . darbepoetin  100 mcg Intravenous Q7 days  . darunavir  800 mg Oral Q breakfast  . feeding supplement  237 mL Oral BID BM  . lamiVUDine  50 mg Oral Daily  . midodrine  10 mg Oral BID WC  . multivitamin  1 tablet Oral Daily  . pantoprazole  20 mg Oral Q1200  . paricalcitol  4 mcg Intravenous 3 times weekly  . ritonavir  100 mg Oral Q breakfast  . sodium chloride  3 mL Intravenous Q12H  . stavudine  20 mg Oral Daily  . vancomycin  125 mg Oral QID  . vancomycin  500 mg Intravenous Q T,Th,Sa-HD  . DISCONTD: feeding supplement (NEPRO CARB STEADY)  237 mL Oral TID BM  . DISCONTD: midodrine  10 mg Oral BID  . DISCONTD: midodrine  10 mg Oral BID WC    Objective: Vital signs in last 24  hours: Temp:  [98 F (36.7 C)-100.2 F (37.9 C)] 98 F (36.7 C) (08/18 1203) Pulse Rate:  [56-79] 56  (08/18 1203) Resp:  [17-18] 18  (08/18 1203) BP: (81-124)/(53-74) 124/65 mmHg (08/18 1203) SpO2:  [96 %-100 %] 97 % (08/18 1203) Weight:  [49.2 kg (108 lb 7.5 oz)] 49.2 kg (108 lb 7.5 oz) (08/17 1521)   General appearance: alert, cooperative and no distress Back: multiple scars, excoriations Resp: clear to auscultation bilaterally Chest wall: no tenderness, HD line Os has no d/c.  Cardio: regularly irregular rhythm GI: normal findings: bowel sounds normal and soft, non-tender  Lab Results  Basename 08/24/11 0535 08/23/11 0910  WBC 2.9* 3.5*  HGB 10.2* 9.5*  HCT 33.7* 30.4*  NA 135 135  K 4.3 3.8  CL 100 100  CO2 29 27  BUN 16 34*  CREATININE 3.44* 5.47*  GLU -- --   Liver Panel  Basename 08/24/11 0535 08/23/11 0910 08/21/11 2342  PROT -- -- 8.9*  ALBUMIN 1.9* 1.9* --  AST -- -- 26  ALT -- -- 5  ALKPHOS -- -- 120*  BILITOT -- -- 0.3  BILIDIR -- -- --  IBILI -- -- --   Sedimentation Rate No results found for this basename: ESRSEDRATE in the last 72 hours C-Reactive Protein No results found for this basename:  CRP:2 in the last 72 hours  Microbiology: Recent Results (from the past 240 hour(s))  CULTURE, BLOOD (ROUTINE X 2)     Status: Normal (Preliminary result)   Collection Time   08/21/11 11:35 PM      Component Value Range Status Comment   Specimen Description BLOOD LEFT ARM   Final    Special Requests BOTTLES DRAWN AEROBIC AND ANAEROBIC 5ML   Final    Culture  Setup Time 08/22/2011 04:58   Final    Culture     Final    Value: ENTEROCOCCUS SPECIES     Note: Gram Stain Report Called to,Read Back By and Verified With: KIMBERLY PORTER @ 2150 ON 08/22/11 BY GOLLD   Report Status PENDING   Incomplete    Organism ID, Bacteria ENTEROCOCCUS SPECIES   Final   CULTURE, BLOOD (ROUTINE X 2)     Status: Normal (Preliminary result)   Collection Time   08/21/11 11:59 PM      Component Value Range Status Comment   Specimen Description BLOOD LEFT HAND   Final    Special Requests BOTTLES DRAWN AEROBIC AND ANAEROBIC 5CC   Final    Culture  Setup Time 08/22/2011 04:59   Final    Culture     Final    Value: ENTEROCOCCUS SPECIES     Note: Gram Stain Report Called to,Read Back By and Verified With: KIMBERLY PORTER @ 2150 ON 08/22/11 BY GOLLD   Report Status PENDING   Incomplete   MRSA PCR SCREENING     Status: Normal   Collection Time   08/22/11  5:24 AM      Component Value Range Status Comment   MRSA by PCR NEGATIVE  NEGATIVE Final   CLOSTRIDIUM DIFFICILE BY PCR     Status: Abnormal   Collection Time   08/22/11 10:48 AM      Component Value Range Status Comment   C difficile by pcr POSITIVE (*) NEGATIVE Final   CULTURE, BLOOD (ROUTINE X 2)     Status: Normal (Preliminary result)   Collection Time   08/22/11 11:15 PM      Component Value Range  Status Comment   Specimen Description BLOOD RIGHT ARM   Final    Special Requests BOTTLES DRAWN AEROBIC AND ANAEROBIC 10CC   Final    Culture  Setup Time 08/23/2011 05:11   Final    Culture     Final    Value: ENTEROCOCCUS SPECIES     Note: Gram Stain Report  Called to,Read Back By and Verified With: RN K. GENGLER ON 08/23/11 AT 2145 BY DTERRY   Report Status PENDING   Incomplete   CULTURE, BLOOD (ROUTINE X 2)     Status: Normal (Preliminary result)   Collection Time   08/22/11 11:20 PM      Component Value Range Status Comment   Specimen Description BLOOD RIGHT ARM   Final    Special Requests BOTTLES DRAWN AEROBIC AND ANAEROBIC 10CC   Final    Culture  Setup Time 08/23/2011 05:11   Final    Culture     Final    Value: ENTEROCOCCUS SPECIES     Note: Gram Stain Report Called to,Read Back By and Verified With: RN K. GENGLER ON 08/23/11 AT 2145 BY DTERRY   Report Status PENDING   Incomplete   CATH TIP CULTURE     Status: Normal (Preliminary result)   Collection Time   08/23/11  2:19 PM      Component Value Range Status Comment   Specimen Description CATH TIP   Final    Special Requests NONE   Final    Culture Culture reincubated for better growth   Final    Report Status PENDING   Incomplete     Studies/Results: No results found.   Assessment/Plan: HIV+ ESRD Bacteremia (VRE probable) Recurrent C diff Total days of antibiotics day 3     vanco   Day 3     ceftriaxone  Day 3        3TC/D4T/DRVr  Would- change IV vanco to zyvox Consider stopping her ceftriaxone Consider long taper of PO vanco Repeat BCx after Zyvox started  Bobby Rumpf Infectious Diseases [id number] 08/24/2011, 2:04 PM   LOS: 3 days

## 2011-08-24 NOTE — Progress Notes (Signed)
CRITICAL VALUE ALERT  Critical value received:  4 Bottles of Blood Cultures gram + cocci/clusters  Date of notification: 08/24/2011  Time of notification:  2148  Critical value read back:yesyes  + Nurse who received alert:  Earleen Reaper RN  MD notified (1st page): Dr Algis Liming   Time of first page: 0015  MD notified (2nd page):  Time of second page:  Responding MD: Dr Algis Liming  Time MD responded: [phone]

## 2011-08-25 ENCOUNTER — Encounter: Payer: Self-pay | Admitting: *Deleted

## 2011-08-25 ENCOUNTER — Encounter (HOSPITAL_COMMUNITY): Payer: Self-pay | Admitting: Internal Medicine

## 2011-08-25 ENCOUNTER — Telehealth: Payer: Self-pay | Admitting: Licensed Clinical Social Worker

## 2011-08-25 LAB — RENAL FUNCTION PANEL
Albumin: 1.9 g/dL — ABNORMAL LOW (ref 3.5–5.2)
BUN: 27 mg/dL — ABNORMAL HIGH (ref 6–23)
CO2: 27 mEq/L (ref 19–32)
Calcium: 9 mg/dL (ref 8.4–10.5)
Chloride: 97 mEq/L (ref 96–112)
Creatinine, Ser: 4.67 mg/dL — ABNORMAL HIGH (ref 0.50–1.10)
GFR calc Af Amer: 11 mL/min — ABNORMAL LOW (ref >90)
GFR calc non Af Amer: 10 mL/min — ABNORMAL LOW (ref >90)
Glucose, Bld: 72 mg/dL (ref 70–99)
Phosphorus: 2.8 mg/dL (ref 2.3–4.6)
Potassium: 4.5 mEq/L (ref 3.5–5.1)
Sodium: 132 mEq/L — ABNORMAL LOW (ref 135–145)

## 2011-08-25 LAB — CULTURE, BLOOD (ROUTINE X 2)

## 2011-08-25 LAB — CBC WITH DIFFERENTIAL/PLATELET
Basophils Absolute: 0 10*3/uL (ref 0.0–0.1)
Basophils Relative: 1 % (ref 0–1)
Eosinophils Absolute: 0 10*3/uL (ref 0.0–0.7)
Eosinophils Relative: 1 % (ref 0–5)
HCT: 36.6 % (ref 36.0–46.0)
Hemoglobin: 11.3 g/dL — ABNORMAL LOW (ref 12.0–15.0)
Lymphocytes Relative: 46 % (ref 12–46)
Lymphs Abs: 1.6 10*3/uL (ref 0.7–4.0)
MCH: 30.8 pg (ref 26.0–34.0)
MCHC: 30.9 g/dL (ref 30.0–36.0)
MCV: 99.7 fL (ref 78.0–100.0)
Monocytes Absolute: 0.6 10*3/uL (ref 0.1–1.0)
Monocytes Relative: 16 % — ABNORMAL HIGH (ref 3–12)
Neutro Abs: 1.3 10*3/uL — ABNORMAL LOW (ref 1.7–7.7)
Neutrophils Relative %: 36 % — ABNORMAL LOW (ref 43–77)
Platelets: 222 10*3/uL (ref 150–400)
RBC: 3.67 MIL/uL — ABNORMAL LOW (ref 3.87–5.11)
RDW: 16.5 % — ABNORMAL HIGH (ref 11.5–15.5)
WBC: 3.5 10*3/uL — ABNORMAL LOW (ref 4.0–10.5)

## 2011-08-25 LAB — HIV-1 RNA QUANT-NO REFLEX-BLD
HIV 1 RNA Quant: 726309 copies/mL — ABNORMAL HIGH (ref <20)
HIV-1 RNA Quant, Log: 5.86 {Log} — ABNORMAL HIGH (ref <1.30)

## 2011-08-25 LAB — T-HELPER CELLS (CD4) COUNT (NOT AT ARMC)
CD4 % Helper T Cell: 19 % — ABNORMAL LOW (ref 33–55)
CD4 T Cell Abs: 130 uL — ABNORMAL LOW (ref 400–2700)

## 2011-08-25 MED ORDER — HEPARIN SODIUM (PORCINE) 1000 UNIT/ML DIALYSIS
100.0000 [IU]/kg | INTRAMUSCULAR | Status: DC | PRN
  Administered 2011-08-26: 4800 [IU] via INTRAVENOUS_CENTRAL
  Filled 2011-08-25: qty 5

## 2011-08-25 MED ORDER — DEXTROSE 5 % IV SOLN
1.5000 g | INTRAVENOUS | Status: AC
  Administered 2011-08-26: 1.5 g via INTRAVENOUS
  Filled 2011-08-25: qty 1.5

## 2011-08-25 MED ORDER — DARBEPOETIN ALFA-POLYSORBATE 100 MCG/0.5ML IJ SOLN
100.0000 ug | INTRAMUSCULAR | Status: DC
  Administered 2011-08-30: 100 ug via INTRAVENOUS
  Filled 2011-08-25: qty 0.5

## 2011-08-25 NOTE — Telephone Encounter (Signed)
This CSW reviewed pt's chart to provide 8/16 appt notes to [facility] for admission.  However, noted pt was hospitalized.  CSW informed in house CSW of plans for pt to be admitted to [facility] [phone], fax [phone].  Both pt and sister were in agreement.

## 2011-08-25 NOTE — Telephone Encounter (Signed)
A letter was sent to patient to come in for a repeat lab. [name]

## 2011-08-25 NOTE — Progress Notes (Signed)
[facility] for Infectious Disease    Date of Admission:  08/21/2011   Total days of antibiotics:4        Day 4 : Oral vancomycin         Day 2: Linezolid         Principal Problem:  *Bacteremia Active Problems:  HIV INFECTION  ANEMIA OF CHRONIC DISEASE  Pancytopenia  ESRD (end stage renal disease)  Protein calorie malnutrition  C. difficile colitis  Hypotension  . aspirin  81 mg Oral Daily  . darbepoetin  100 mcg Intravenous Q7 days  . darunavir  800 mg Oral Q breakfast  . feeding supplement  237 mL Oral BID BM  . lamiVUDine  50 mg Oral Daily  . linezolid  600 mg Intravenous Q12H  . midodrine  10 mg Oral BID WC  . multivitamin  1 tablet Oral Daily  . pantoprazole  20 mg Oral Q1200  . paricalcitol  4 mcg Intravenous 3 times weekly  . ritonavir  100 mg Oral Q breakfast  . sodium chloride  3 mL Intravenous Q12H  . stavudine  20 mg Oral Daily  . vancomycin  125 mg Oral QID  . DISCONTD: cefTRIAXone (ROCEPHIN)  IV  1 g Intravenous Q24H  . DISCONTD: vancomycin  500 mg Intravenous Q T,Th,Sa-HD   Subjective: Alert and oriented. Pt had 4 BM yesterday and 2 times this morning. Mild, Intermittent abdominal pain. Pt has been afebrile and Tmax 97.9. Pt denies any chest pain or SOB.  Objective: Temp:  [97.6 F (36.4 C)-98 F (36.7 C)] 97.9 F (36.6 C) (08/19 0450) Pulse Rate:  [44-56] 56  (08/19 0450) Resp:  [18-20] 18  (08/19 0450) BP: (114-158)/(65-78) 114/66 mmHg (08/19 0450) SpO2:  [92 %-99 %] 96 % (08/19 0450) Weight:  [48 kg (105 lb 13.1 oz)] 48 kg (105 lb 13.1 oz) (08/18 2137) General: resting in bed, NAD  Skin: scar from previous surgery  HEENT: PERRL, EOMI, no scleral icterus, no oral thrush, lymphadenopathy  Cardiac: RRR, 2/6 systolic murmur Pulm: clear to auscultation bilaterally, moving normal volumes of air  Abd: soft, nontender, nondistended, diminished BS present  Ext: warm and well perfused, no pedal edema  Neuro: alert and oriented X3, cranial nerves  II-XII grossly intact  Lab Results Lab Results  Component Value Date   WBC 3.5* 08/25/2011   HGB 11.3* 08/25/2011   HCT 36.6 08/25/2011   MCV 99.7 08/25/2011   PLT 222 08/25/2011    Lab Results  Component Value Date   CREATININE 4.67* 08/25/2011   BUN 27* 08/25/2011   NA 132* 08/25/2011   K 4.5 08/25/2011   CL 97 08/25/2011   CO2 27 08/25/2011    Lab Results  Component Value Date   ALT 5 08/21/2011   AST 26 08/21/2011   ALKPHOS 120* 08/21/2011   BILITOT 0.3 08/21/2011      Microbiology: Recent Results (from the past 240 hour(s))  CULTURE, BLOOD (ROUTINE X 2)     Status: Normal (Preliminary result)   Collection Time   08/21/11 11:35 PM      Component Value Range Status Comment   Specimen Description BLOOD LEFT ARM   Final    Special Requests BOTTLES DRAWN AEROBIC AND ANAEROBIC 5ML   Final    Culture  Setup Time 08/22/2011 04:58   Final    Culture     Final    Value: ENTEROCOCCUS SPECIES     Note: Gram Stain Report Called to,Read  Back By and Verified With: KIMBERLY PORTER @ 2150 ON 08/22/11 BY GOLLD   Report Status PENDING   Incomplete    Organism ID, Bacteria ENTEROCOCCUS SPECIES   Final   CULTURE, BLOOD (ROUTINE X 2)     Status: Normal (Preliminary result)   Collection Time   08/21/11 11:59 PM      Component Value Range Status Comment   Specimen Description BLOOD LEFT HAND   Final    Special Requests BOTTLES DRAWN AEROBIC AND ANAEROBIC 5CC   Final    Culture  Setup Time 08/22/2011 04:59   Final    Culture     Final    Value: ENTEROCOCCUS SPECIES     Note: Gram Stain Report Called to,Read Back By and Verified With: KIMBERLY PORTER @ 2150 ON 08/22/11 BY GOLLD   Report Status PENDING   Incomplete   MRSA PCR SCREENING     Status: Normal   Collection Time   08/22/11  5:24 AM      Component Value Range Status Comment   MRSA by PCR NEGATIVE  NEGATIVE Final   CLOSTRIDIUM DIFFICILE BY PCR     Status: Abnormal   Collection Time   08/22/11 10:48 AM      Component Value Range Status  Comment   C difficile by pcr POSITIVE (*) NEGATIVE Final   CULTURE, BLOOD (ROUTINE X 2)     Status: Normal (Preliminary result)   Collection Time   08/22/11 11:15 PM      Component Value Range Status Comment   Specimen Description BLOOD RIGHT ARM   Final    Special Requests BOTTLES DRAWN AEROBIC AND ANAEROBIC 10CC   Final    Culture  Setup Time 08/23/2011 05:11   Final    Culture     Final    Value: ENTEROCOCCUS SPECIES     Note: Gram Stain Report Called to,Read Back By and Verified With: RN K. GENGLER ON 08/23/11 AT 2145 BY DTERRY   Report Status PENDING   Incomplete   CULTURE, BLOOD (ROUTINE X 2)     Status: Normal (Preliminary result)   Collection Time   08/22/11 11:20 PM      Component Value Range Status Comment   Specimen Description BLOOD RIGHT ARM   Final    Special Requests BOTTLES DRAWN AEROBIC AND ANAEROBIC 10CC   Final    Culture  Setup Time 08/23/2011 05:11   Final    Culture     Final    Value: ENTEROCOCCUS SPECIES     Note: Gram Stain Report Called to,Read Back By and Verified With: RN K. [place] ON 08/23/11 AT 2145 BY DTERRY   Report Status PENDING   Incomplete   CATH TIP CULTURE     Status: Normal (Preliminary result)   Collection Time   08/23/11  2:19 PM      Component Value Range Status Comment   Specimen Description CATH TIP   Final    Special Requests NONE   Final    Culture 60 COLONIES ENTEROCOCCUS SPECIES   Final    Report Status PENDING   Incomplete    Assessment and Plan: [name] is a 56 y.o. 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   1. C.diff:  Positive c.diff PCR and diarrhea . As well as pt has  had h/o hospitalization for C.diff on 07/2011 and treated with flagyl for 2 weeks. Concerned  for recurrent C. Diff infection.  1. Continue oral vancomycin  2. Contact isolation    Bacterial  endocarditis: Positive blood  culture with enterococcus. 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  1. TEE 2 . Continue Linezolid  3. HIV/AIDS: H/o HIV and on HAART medication. PCP pneumonia on 5/13 and treated with bactrim. Recent improvement of viral load 4783 and CD4 210 (07/28/11) from [id number] and 80 (07/12/11) respectively. Pt was seen in the ID clinic on 7/22 by Dr Baxter Flattery. Next appointment on 08/27/11.   [place], [place] 08/25/2011, 9:45 AM     INFECTIOUS DISEASE ATTENDING ADDENDUM:     [facility] for Infectious Disease   Date: 08/26/2011  Patient name: [name]  Medical record number: [id number]  Date of birth: [birth year]    This patient has been seen and discussed with the house staff. Please see their note for complete details. I concur with their findings with the following additions/corrections:  For the VRE in blood will continue zyvox. Agree  With TEE  Continue po vanco qid while pt getting her zyvox and then extend for 2 weeks beyond completion of therapy  She has been noncompliant with ARV with VL >700K. SHould genotype this  CD4 <200 indicating need for prohylaxis. I would hold off for now and see if repeat Cd4 comes up in another week or 2 as I have concern that TMP./SMX may complicate and increase risk of CDI (which is already big problem with zyvox on board)  [name] 08/26/2011, 12:27 PM

## 2011-08-25 NOTE — Progress Notes (Signed)
Subjective:  No complaints, breathing well. Diarrrhea  Objective: Vital signs in last 24 hours: Temp:  [97.6 F (36.4 C)-98 F (36.7 C)] 97.9 F (36.6 C) (08/19 0450) Pulse Rate:  [44-56] 56  (08/19 0450) Resp:  [18-20] 18  (08/19 0450) BP: (114-158)/(65-78) 114/66 mmHg (08/19 0450) SpO2:  [92 %-99 %] 96 % (08/19 0450) Weight:  [48 kg (105 lb 13.1 oz)] 48 kg (105 lb 13.1 oz) (08/18 2137) Weight change: -1 kg (-2 lb 3.3 oz)  Intake/Output from previous day: 08/18 0701 - 08/19 0700 In: 1361.3 [P.O.:840; I.V.:221.3; IV Piggyback:300] Out: -    EXAM: General appearance:  Alert, comfortable, in no apparent distress Chronically ill appearing Resp:  CTA without rales, rhonchi, or wheezes Cardio:  RRR with Gr II/VI systolic murmur GI:  + BS, soft and nontender Extremities:  No edema Access:  Right IJ catheter removed 8/17  Lab Results:  Basename 08/25/11 0600 08/24/11 0535  WBC 3.5* 2.9*  HGB 11.3* 10.2*  HCT 36.6 33.7*  PLT 222 184   BMET:  Basename 08/25/11 0600 08/24/11 0535  NA 132* 135  K 4.5 4.3  CL 97 100  CO2 27 29  GLUCOSE 72 74  BUN 27* 16  CREATININE 4.67* 3.44*  CALCIUM 9.0 8.6  ALBUMIN 1.9* 1.9*   No results found for this basename: PTH:2 in the last 72 hours Iron Studies: No results found for this basename: IRON,TIBC,TRANSFERRIN,FERRITIN in the last 72 hours  Outpatient HD: [facility], 4 hrs, 54 kg EDW. Hep 5900u. EPO 13,000u. Zemplar 4ug. R IJ TDcath. Nepro 1 can each HD. 2K/2.25Ca bath.   Assessment/Plan: 1. Sepsis - Blood cultures + 2/2 for GPC in clusters; hx of endocarditis and MSSA bacteremia in May; PC removed 8/17; tip sent for C & S - no growth pending, on vancomycin and ceftriaxone.  New catheter placement per VVS tomorrow. 2. ESRD - HD on TTS at [facility]; HD 8/17 with net UF 2 L 3. Hypotension/volume - takes metoprolol 12.5 qhs presumably for cardiomyopathy, and takes midodrine 10 mg bid at home; Metoprolol is on hold. Midodrine resumed.  4. CM - EF  40-45% 5. HIV - continue current antiretroviral tx, ANC 1.5 6. End-stage access - no further perm access, new catheter placement tomorrow. 7. Anemia of CKD - cont Aranesp 100, off IV iron. Hgb stable 10.2 8/18. 8. Metabolic bone disease - cont Zemplar, off binders. P slightly low Last PTH 225, at goal.  9. Malnutrition - increased to high protein renal diet. Change supplement to Ensure Plus (her preference). It is higher in K, than nepro, so need to watch.    LOS: 4 days   LYLES,CHARLES 08/25/2011,8:59 AM  I have seen and examined this patient and agree with the plan of care W/U D  Sabrie Moritz L 08/25/2011, 9:32 AM

## 2011-08-25 NOTE — Progress Notes (Signed)
Physical Therapy Evaluation Patient Details Name: [name] MRN: [id number] DOB: Jul 12, 1955 Today's Date: 08/25/2011 Time: [id number] PT Time Calculation (min): 34 min  PT Assessment / Plan / Recommendation Clinical Impression  56 yo female admitted with bacteremia/diarrhea (+C. Diff)/ESRD in setting of HIV presents with decr functional mobility; Will benefit from PT to maximize independence and safety wiht mobility and enabl esafe dchome    PT Assessment  Patient needs continued PT services    Follow Up Recommendations  Home health PT;Supervision - Intermittent    Barriers to Discharge        Equipment Recommendations  None recommended by PT    Recommendations for Other Services     Frequency Min 3X/week    Precautions / Restrictions Precautions Precautions: Fall   Pertinent Vitals/Pain Abdominal discomfort, improved after using bedside commode      Mobility  Bed Mobility Bed Mobility: Supine to Sit;Sitting - Scoot to Edge of Bed Supine to Sit: 5: Supervision Sitting - Scoot to Edge of Bed: 5: Supervision;With rail Details for Bed Mobility Assistance: Pretty smooth transitions, cues for safety Transfers Transfers: Sit to Stand;Stand to Sit Sit to Stand: 4: Min guard;With upper extremity assist;From bed Stand to Sit: 4: Min guard;To chair/3-in-1;To bed;With upper extremity assist Details for Transfer Assistance: Slow moving and apprehensive on feet, but overall steady Ambulation/Gait Ambulation/Gait Assistance: 4: Min assist Ambulation Distance (Feet): 20 Feet Assistive device: 1 person hand held assist Ambulation/Gait Assistance Details: Slow moving  with definite need for UE support for steadiness; cues for posture    Exercises     PT Diagnosis: Difficulty walking  PT Problem List: Decreased activity tolerance;Decreased balance;Decreased mobility;Decreased knowledge of use of DME PT Treatment Interventions: DME instruction;Gait training;Functional mobility  training;Therapeutic activities;Therapeutic exercise;Balance training;Patient/family education   PT Goals Acute Rehab PT Goals PT Goal Formulation: With patient Time For Goal Achievement: 09/08/11 Potential to Achieve Goals: Good Pt will go Supine/Side to Sit: with modified independence PT Goal: Supine/Side to Sit - Progress: Goal set today Pt will go Sit to Supine/Side: with modified independence PT Goal: Sit to Supine/Side - Progress: Goal set today Pt will go Sit to Stand: with modified independence PT Goal: Sit to Stand - Progress: Goal set today Pt will go Stand to Sit: with modified independence PT Goal: Stand to Sit - Progress: Goal set today Pt will Ambulate: >150 feet;with modified independence;with rolling walker PT Goal: Ambulate - Progress: Goal set today Pt will Perform Home Exercise Program: with supervision, verbal cues required/provided PT Goal: Perform Home Exercise Program - Progress: Goal set today  Visit Information  Last PT Received On: 08/25/11 Assistance Needed: +1    Subjective Data  Subjective: Needing to use [facility] Patient Stated Goal: feel better   Prior Functioning  Home Living Lives With: Daughter Available Help at Discharge: Family;Available 24 hours/day Type of Home: Apartment Home Access: Level entry Home Layout: One level Bathroom Shower/Tub: Tub/shower unit;Curtain Biochemist, clinical: Standard Bathroom Accessibility: Yes How Accessible: Accessible via walker Home Adaptive Equipment: Walker - rolling;Shower chair with back;Bedside commode/3-in-1;Wheelchair - manual Prior Function Level of Independence: Independent with assistive device(s) Driving: No Communication Communication: No difficulties    Cognition  Overall Cognitive Status: Appears within functional limits for tasks assessed/performed Arousal/Alertness: Awake/alert Orientation Level: Appears intact for tasks assessed Behavior During Session: [facility] for tasks performed      Extremity/Trunk Assessment Right Upper Extremity Assessment RUE ROM/Strength/Tone: [facility] for tasks assessed Left Upper Extremity Assessment LUE ROM/Strength/Tone: [facility] for tasks assessed Right  Lower Extremity Assessment RLE ROM/Strength/Tone: WFL for tasks assessed Left Lower Extremity Assessment LLE ROM/Strength/Tone: [facility] for tasks assessed Trunk Assessment Trunk Assessment: Normal   Balance    End of Session PT - End of Session Activity Tolerance: Patient tolerated treatment well Patient left: in chair;with call bell/phone within reach Nurse Communication: Mobility status  GP     Roney [facility] [place], [place]  08/25/2011, 11:39 AM

## 2011-08-25 NOTE — Progress Notes (Addendum)
Subjective: Pt reports she had 3-4 diarrhea stools yesterday and one so far today. She reports hiccups that just started today. She reports only her sister Enid Derry and daughter (1/2) Danae Chen know about her HIV status. She has had HIV x 5-6 years and believes she contracted it from smoking crack.  She smoked crack x 1-2 years but does not currently.   Objective: Vital signs in last 24 hours: Filed Vitals:   08/24/11 2004 08/24/11 2137 08/25/11 0259 08/25/11 0450  BP: 158/70 157/70 149/69 114/66  Pulse: 46 47 44 56  Temp:  97.8 F (36.6 C) 97.9 F (36.6 C) 97.9 F (36.6 C)  TempSrc:  Oral Oral Oral  Resp:  20 18 18   Height:      Weight:  105 lb 13.1 oz (48 kg)    SpO2:  99% 98% 96%   Weight change: -2 lb 3.3 oz (-1 kg)  Intake/Output Summary (Last 24 hours) at 08/25/11 0943 Last data filed at 08/25/11 0701  Gross per 24 hour  Intake 1121.25 ml  Output      0 ml  Net 1121.25 ml   Vitals reviewed. General: resting in bed, NAD HEENT:  no scleral icterus Cardiac: +systolic murmer, bradycardiac  Pulm: clear to auscultation bilaterally, no wheezes, rales, or rhonchi Abd: soft, nontender, nondistended, BS present (normal) Ext: warm and well perfused, no pedal edema Neuro: alert and oriented X3 Skin: hyperpigmented skin changes to back   Lab Results: Basic Metabolic Panel:  Lab [id number] 0600 08/24/11 0535 08/22/11 0100  NA 132* 135 --  K 4.5 4.3 --  CL 97 100 --  CO2 27 29 --  GLUCOSE 72 74 --  BUN 27* 16 --  CREATININE 4.67* 3.44* --  CALCIUM 9.0 8.6 --  MG -- -- 1.7  PHOS 2.8 2.7 --   Liver Function Tests:  Lab 08/25/11 0600 08/24/11 0535 08/21/11 2342  AST -- -- 26  ALT -- -- 5  ALKPHOS -- -- 120*  BILITOT -- -- 0.3  PROT -- -- 8.9*  ALBUMIN 1.9* 1.9* --    Lab 08/22/11 0100  LIPASE 40  AMYLASE --   CBC:  Lab 08/25/11 0600 08/24/11 0535  WBC 3.5* 2.9*  NEUTROABS 1.3* 1.5*  HGB 11.3* 10.2*  HCT 36.6 33.7*  MCV 99.7 101.8*  PLT 222 184   CBG:  Lab  08/23/11 1423 08/23/11 1341 08/22/11 0503  GLUCAP 97 63* 72     Misc. Labs: +C. dificile  +blood cultures x 4 enterococcus  Micro Results: Recent Results (from the past 240 hour(s))  CULTURE, BLOOD (ROUTINE X 2)     Status: Normal (Preliminary result)   Collection Time   08/21/11 11:35 PM      Component Value Range Status Comment   Specimen Description BLOOD LEFT ARM   Final    Special Requests BOTTLES DRAWN AEROBIC AND ANAEROBIC 5ML   Final    Culture  Setup Time 08/22/2011 04:58   Final    Culture     Final    Value: ENTEROCOCCUS SPECIES     Note: Gram Stain Report Called to,Read Back By and Verified With: KIMBERLY PORTER @ 2150 ON 08/22/11 BY GOLLD   Report Status PENDING   Incomplete    Organism ID, Bacteria ENTEROCOCCUS SPECIES   Final   CULTURE, BLOOD (ROUTINE X 2)     Status: Normal (Preliminary result)   Collection Time   08/21/11 11:59 PM      Component Value Range Status  Comment   Specimen Description BLOOD LEFT HAND   Final    Special Requests BOTTLES DRAWN AEROBIC AND ANAEROBIC 5CC   Final    Culture  Setup Time 08/22/2011 04:59   Final    Culture     Final    Value: ENTEROCOCCUS SPECIES     Note: Gram Stain Report Called to,Read Back By and Verified With: KIMBERLY PORTER @ 2150 ON 08/22/11 BY GOLLD   Report Status PENDING   Incomplete   MRSA PCR SCREENING     Status: Normal   Collection Time   08/22/11  5:24 AM      Component Value Range Status Comment   MRSA by PCR NEGATIVE  NEGATIVE Final   CLOSTRIDIUM DIFFICILE BY PCR     Status: Abnormal   Collection Time   08/22/11 10:48 AM      Component Value Range Status Comment   C difficile by pcr POSITIVE (*) NEGATIVE Final   CULTURE, BLOOD (ROUTINE X 2)     Status: Normal (Preliminary result)   Collection Time   08/22/11 11:15 PM      Component Value Range Status Comment   Specimen Description BLOOD RIGHT ARM   Final    Special Requests BOTTLES DRAWN AEROBIC AND ANAEROBIC 10CC   Final    Culture  Setup Time  08/23/2011 05:11   Final    Culture     Final    Value: ENTEROCOCCUS SPECIES     Note: Gram Stain Report Called to,Read Back By and Verified With: RN K. GENGLER ON 08/23/11 AT 2145 BY DTERRY   Report Status PENDING   Incomplete   CULTURE, BLOOD (ROUTINE X 2)     Status: Normal (Preliminary result)   Collection Time   08/22/11 11:20 PM      Component Value Range Status Comment   Specimen Description BLOOD RIGHT ARM   Final    Special Requests BOTTLES DRAWN AEROBIC AND ANAEROBIC 10CC   Final    Culture  Setup Time 08/23/2011 05:11   Final    Culture     Final    Value: ENTEROCOCCUS SPECIES     Note: Gram Stain Report Called to,Read Back By and Verified With: RN K. GENGLER ON 08/23/11 AT 2145 BY DTERRY   Report Status PENDING   Incomplete   CATH TIP CULTURE     Status: Normal (Preliminary result)   Collection Time   08/23/11  2:19 PM      Component Value Range Status Comment   Specimen Description CATH TIP   Final    Special Requests NONE   Final    Culture 80 COLONIES ENTEROCOCCUS SPECIES   Final    Report Status PENDING   Incomplete    Studies/Results: No results found. Medications:  Scheduled Meds:    . aspirin  81 mg Oral Daily  . darbepoetin  100 mcg Intravenous Q7 days  . darunavir  800 mg Oral Q breakfast  . feeding supplement  237 mL Oral BID BM  . lamiVUDine  50 mg Oral Daily  . linezolid  600 mg Intravenous Q12H  . midodrine  10 mg Oral BID WC  . multivitamin  1 tablet Oral Daily  . pantoprazole  20 mg Oral Q1200  . paricalcitol  4 mcg Intravenous 3 times weekly  . ritonavir  100 mg Oral Q breakfast  . sodium chloride  3 mL Intravenous Q12H  . stavudine  20 mg Oral Daily  . vancomycin  125 mg Oral QID  . DISCONTD: cefTRIAXone (ROCEPHIN)  IV  1 g Intravenous Q24H  . DISCONTD: vancomycin  500 mg Intravenous Q T,Th,Sa-HD   Continuous Infusions:    . 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 Diff (07/2011), MGUS, h/o  anemia, h/o pancytopenia with EF 40-45%.  She presented 8/16 with fever and ab pain.    1. Bacteremia -4 blood Cxs aerobic/ anaerobic +enteroccous sensitive to Linezolid, Resistant to Amp, Synergistic with Norva Karvonen -prev echo 05/2011 EF 40-45%, mild to mod reduced systolic function, mod LVH, mild MR, mild AR, +chordae tendineae related small bright mobile area which vegetation could not be excluded -Abx history from May 2013 from [place]: Zosyn5/2013 x 1 dose, Vanc 05/18/11 x 1 dose and Ancef 05/26/11 x 1 dose; records from [facility]) documented prev MSSA bacteremia 5/13 tx'ed with ancef TTHSat but unclear duration and start date.  Plan -Pt Rx Zyvoxx 60 mg iv since 8/18; previously received Rocephin 1 g q24 hours iv and Vancomycin iv 500 mg since 8/18 but now discontinued  -pending repeat TTE; ID rec TEE -ID consulted and following appreciate recs; they rec TEE and blood cultures today; rec treating pt for at least 4 weeks for bacteremia and in conjunction with tx'ing C.diff x duration treating for bacteremia plus 2 more weeks -renal and VVS following pt; HD cath removed 8/17 pending culture   2. C.diff -still with diarrhea  -isolation precautions and protocol -Rx oral Vancomycin 125 mg qid (since 8/16) will tx for duration of abx therapy for bacteremia plus 2 weeks after discontinue abx for bacteremia   3. Fever -resolved currently -T max 102. 9, last spike in fever was 08/23/11 of 100.2 -etiology could be multifactorial related to + C diff and/or  bacteremia +blood cultures x 4 gm +enteroccocus vs potential HD cath source as  infection -will monitor   4. Bradycardia  -pt is asymptomatic, HR ranging 40s-50s  -will monitor via tele   5. History of hypotension -noted this admission -hold home Lopressor  -Added Midodrine 10 mg bid  -will monitor   6. HIV INFECTION  -pending CD4 and viral ct -last CD4 ct 210 07/2011 and 07/2011 Viral load 4783 -restarted home meds (Norvir, Epivir, Prezista,  -ID following (Dr. Wilder Glade or Johnnye Sima)   7. ESRD (end stage renal disease) (03/19/2007) -renal consulted and following -Cr. 4.67 today baseline 3-4 range  -HD TTHSat -HD access chest pulled after dialysis 8/17 d/t bacteremia -will repeat blood cultures today and if neg will f/u with getting another access site this week via VVS   8. ANEMIA OF CHRONIC DISEASE -Aransep   9. Pancytopenia  -chronic -will monitor cbc   10. DVT Px -Hep/SCDs -protonix   11. F/E/N -renal following electrolytes -diet ordered   12. Dispo -d/c when w/u complete no date as of yet   Code status: full   LOS: 4 days   Cresenciano Genre [id number] 08/25/2011, 9:43 AM

## 2011-08-25 NOTE — Progress Notes (Signed)
INFECTIOUS DISEASE ATTENDING ADDENDUM:     [facility]   Date: 08/25/2011  Patient name: [name]  Medical record number: [id number]  Date of birth: 10/09/1955    This patient has been seen and discussed with the house staff. Please see their note for complete details. I concur with their findings with the following additions/corrections:  Patient now with VRE bacteremia, HD associated (with enterococci growing from HD cat tip as well).   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 Zyvox   has up to 40% relapse with zyvox. Other options could include daptomycin  We need blood cultures done today to prove clearance from blood. Nephrology wants to put new HD cath in tomorrow. (hopefully repeawt blood cultures today will stay negative, otherwise would need to take out the new HD catheter again!0   She will NEED FULL DOSE PO VANCOMYCIN for the entire time she is receiving rx for VRE bacteremia/endocarditis and then additional 2 weeks after zyvox is stopped   Rhina Brackett Dam 08/25/2011, 1:57 PM

## 2011-08-25 NOTE — Progress Notes (Signed)
At beginning of shift, patient's HR dropped down to 43-46 Bpm.  VS stable.  Patient is A&O x 4 with no complaints of chest pain, shortness or breath, or dizziness.  MD notified.  Received and implemented new orders to infuse NS at 75 ml per hour x 3 hours.  Patient continued to be asymptomatic through the night and heart rate continued to sustain in the 40's.  At approximately 0100 hours, heart rate went into high 50's.  After approximately 10 minutes, HR went back to 43-46 Bpm.  Again VS are stable and patient is asymptomatic.  MD notified and order received to notify MD if patient becomes symptomatic or develops 2nd Degree Type II or 3rd Degree HB.  Will continue to monitor patient.  Linus Galas 08/25/2011

## 2011-08-25 NOTE — Progress Notes (Signed)
Internal Medicine Attending  Date: 08/25/2011  Patient name: [name] Medical record number: [id number] Date of birth: 03/03/1955 Age: 56 y.o. Gender: female  I saw and evaluated the patient. I reviewed the resident's note by Dr. Aundra Dubin and I agree with the resident's findings and plans as documented in her note.

## 2011-08-25 NOTE — Progress Notes (Signed)
Nutrition Follow-up  Intervention:   1. RD to add Low Tyramine restrictions to current renal diet order given current prescription of zyvox; however strongly recommend diet liberalization to solely Low Tyramine (removing renal restrictions) if able given persistent poor PO intake and patient's food preferences. 2. RD to continue to follow nutrition care plan  Assessment:   C-diff positive. Pt prefers Ensure Complete vs Nepro. Nephrology PA changed supplement on 8/18 per pt preferences. Pt states she is drinking at least 3 Ensures daily.  RD drawn to chart secondary to current prescription of linezolid (zyvox), a medication with potential interactions with high tyramine-containing foods. RD provided "Tyramine-Restricted Nutrition Therapy" handout from the Academy of [employer]etics and discussed importance of this restriction while taking current medication. Reviewed list of foods to avoid.   Intake remains variable, ranging form 0 - 50%. Pt states that she is not eating well 2/2 food choices.  Diet Order:  Renal 80 - 90 Supplement: Ensure Complete PO BID  Meds: Scheduled Meds:   . aspirin  81 mg Oral Daily  . darbepoetin  100 mcg Intravenous Q7 days  . darunavir  800 mg Oral Q breakfast  . feeding supplement  237 mL Oral BID BM  . lamiVUDine  50 mg Oral Daily  . linezolid  600 mg Intravenous Q12H  . midodrine  10 mg Oral BID WC  . multivitamin  1 tablet Oral Daily  . pantoprazole  20 mg Oral Q1200  . paricalcitol  4 mcg Intravenous 3 times weekly  . ritonavir  100 mg Oral Q breakfast  . sodium chloride  3 mL Intravenous Q12H  . stavudine  20 mg Oral Daily  . vancomycin  125 mg Oral QID  . DISCONTD: cefTRIAXone (ROCEPHIN)  IV  1 g Intravenous Q24H  . DISCONTD: vancomycin  500 mg Intravenous Q T,Th,Sa-HD   Continuous Infusions:   . sodium chloride 75 mL/hr at 08/24/11 2003   PRN Meds:.sodium chloride, sodium chloride, acetaminophen, heparin, heparin, heparin, lidocaine,  lidocaine-prilocaine, ondansetron (ZOFRAN) IV, ondansetron, pentafluoroprop-tetrafluoroeth  Labs:  CMP     Component Value Date/Time   NA 132* 08/25/2011 0600   K 4.5 08/25/2011 0600   CL 97 08/25/2011 0600   CO2 27 08/25/2011 0600   GLUCOSE 72 08/25/2011 0600   BUN 27* 08/25/2011 0600   CREATININE 4.67* 08/25/2011 0600   CREATININE 4.98* 02/19/2011 1048   CALCIUM 9.0 08/25/2011 0600   CALCIUM 7.6* 09/14/2007 1321   PROT 8.9* 08/21/2011 2342   ALBUMIN 1.9* 08/25/2011 0600   AST 26 08/21/2011 2342   ALT 5 08/21/2011 2342   ALKPHOS 120* 08/21/2011 2342   BILITOT 0.3 08/21/2011 2342   GFRNONAA 10* 08/25/2011 0600   GFRAA 11* 08/25/2011 0600   Phosphorus  Date/Time Value Range Status  08/25/2011  6:00 AM 2.8  2.3 - 4.6 mg/dL Final  08/24/2011  5:35 AM 2.7  2.3 - 4.6 mg/dL Final  08/23/2011  9:10 AM 2.9  2.3 - 4.6 mg/dL Final   Magnesium  Date/Time Value Range Status  08/22/2011  1:00 AM 1.7  1.5 - 2.5 mg/dL Final  05/27/2011  3:11 PM 1.9  1.5 - 2.5 mg/dL Final  09/11/2010  4:30 AM 1.7  1.5 - 2.5 mg/dL Final   Potassium  Date/Time Value Range Status  08/25/2011  6:00 AM 4.5  3.5 - 5.1 mEq/L Final  08/24/2011  5:35 AM 4.3  3.5 - 5.1 mEq/L Final  08/23/2011  9:10 AM 3.8  3.5 - 5.1  mEq/L Final    Intake/Output Summary (Last 24 hours) at 08/25/11 1026 Last data filed at 08/25/11 0701  Gross per 24 hour  Intake 1121.25 ml  Output      0 ml  Net 1121.25 ml  BM 8/18  Weight Status:  105 lb/48 kg s/p HD 8/18 - trending down  Body mass index is 18.75 kg/(m^2). Weight is WNL.  Estimated needs:  1600 - 1800 kcal, 70 - 80 grams protein  Nutrition Dx:  Malnutrition r/t abdominal pain resulting in poor oral intake AEB intake < 75% of estimated energy requirements for at least the past 2 months, and 19% weight loss in 4 months.  Goal:  Intake to meet >90% of estimated nutrition needs to prevent further depletion of lean body mass. Not met.  Monitor:  PO intake, weights, labs  Inda Coke MS, [place],  LDN Pager: [phone] After-hours pager: [phone]

## 2011-08-26 ENCOUNTER — Encounter (HOSPITAL_COMMUNITY): Payer: Self-pay | Admitting: Anesthesiology

## 2011-08-26 ENCOUNTER — Encounter (HOSPITAL_COMMUNITY): Payer: Self-pay | Admitting: *Deleted

## 2011-08-26 ENCOUNTER — Inpatient Hospital Stay (HOSPITAL_COMMUNITY): Payer: Medicaid Other | Admitting: Anesthesiology

## 2011-08-26 ENCOUNTER — Inpatient Hospital Stay (HOSPITAL_COMMUNITY): Payer: Medicaid Other

## 2011-08-26 ENCOUNTER — Encounter (HOSPITAL_COMMUNITY)
Admission: EM | Disposition: A | Discharge status: Home / Self Care | Payer: Self-pay | Source: Home / Self Care | Attending: Internal Medicine

## 2011-08-26 HISTORY — PX: INSERTION OF DIALYSIS CATHETER: SHX1324

## 2011-08-26 LAB — DIFFERENTIAL
Basophils Absolute: 0.1 10*3/uL (ref 0.0–0.1)
Basophils Relative: 2 % — ABNORMAL HIGH (ref 0–1)
Eosinophils Absolute: 0.1 10*3/uL (ref 0.0–0.7)
Eosinophils Relative: 2 % (ref 0–5)
Lymphocytes Relative: 57 % — ABNORMAL HIGH (ref 12–46)
Lymphs Abs: 2 10*3/uL (ref 0.7–4.0)
Monocytes Absolute: 0.4 10*3/uL (ref 0.1–1.0)
Monocytes Relative: 12 % (ref 3–12)
Neutro Abs: 1 10*3/uL — ABNORMAL LOW (ref 1.7–7.7)
Neutrophils Relative %: 27 % — ABNORMAL LOW (ref 43–77)

## 2011-08-26 LAB — RENAL FUNCTION PANEL
Albumin: 1.9 g/dL — ABNORMAL LOW (ref 3.5–5.2)
BUN: 33 mg/dL — ABNORMAL HIGH (ref 6–23)
CO2: 25 mEq/L (ref 19–32)
Calcium: 8.9 mg/dL (ref 8.4–10.5)
Chloride: 95 mEq/L — ABNORMAL LOW (ref 96–112)
Creatinine, Ser: 5.65 mg/dL — ABNORMAL HIGH (ref 0.50–1.10)
GFR calc Af Amer: 9 mL/min — ABNORMAL LOW (ref >90)
GFR calc non Af Amer: 8 mL/min — ABNORMAL LOW (ref >90)
Glucose, Bld: 76 mg/dL (ref 70–99)
Phosphorus: 3.1 mg/dL (ref 2.3–4.6)
Potassium: 5.2 mEq/L — ABNORMAL HIGH (ref 3.5–5.1)
Sodium: 130 mEq/L — ABNORMAL LOW (ref 135–145)

## 2011-08-26 LAB — GLUCOSE, CAPILLARY: Glucose-Capillary: 85 mg/dL (ref 70–99)

## 2011-08-26 LAB — CBC
HCT: 37.7 % (ref 36.0–46.0)
Hemoglobin: 11.5 g/dL — ABNORMAL LOW (ref 12.0–15.0)
MCH: 30.7 pg (ref 26.0–34.0)
MCHC: 30.5 g/dL (ref 30.0–36.0)
MCV: 100.5 fL — ABNORMAL HIGH (ref 78.0–100.0)
Platelets: 243 10*3/uL (ref 150–400)
RBC: 3.75 MIL/uL — ABNORMAL LOW (ref 3.87–5.11)
RDW: 16.4 % — ABNORMAL HIGH (ref 11.5–15.5)
WBC: 3.6 10*3/uL — ABNORMAL LOW (ref 4.0–10.5)

## 2011-08-26 SURGERY — INSERTION OF DIALYSIS CATHETER
Anesthesia: Monitor Anesthesia Care | Laterality: Right | Wound class: Clean

## 2011-08-26 MED ORDER — FENTANYL CITRATE 0.05 MG/ML IJ SOLN: 25.0000 ug | INTRAMUSCULAR | Status: DC | PRN

## 2011-08-26 MED ORDER — PHENYLEPHRINE HCL 10 MG/ML IJ SOLN
INTRAMUSCULAR | Status: DC | PRN
  Administered 2011-08-26: 40 ug via INTRAVENOUS

## 2011-08-26 MED ORDER — RENA-VITE PO TABS
1.0000 | ORAL_TABLET | Freq: Every day | ORAL | Status: DC
  Administered 2011-08-27 – 2011-09-01 (×6): 1 via ORAL
  Filled 2011-08-26 (×7): qty 1

## 2011-08-26 MED ORDER — STAVUDINE 15 MG PO CAPS
15.0000 mg | ORAL_CAPSULE | Freq: Every day | ORAL | Status: DC
  Administered 2011-08-27 – 2011-09-02 (×7): 15 mg via ORAL
  Filled 2011-08-26 (×7): qty 1

## 2011-08-26 MED ORDER — ALBUMIN HUMAN 25 % IV SOLN
25.0000 g | Freq: Once | INTRAVENOUS | Status: AC
  Administered 2011-08-26: 25 g via INTRAVENOUS
  Filled 2011-08-26: qty 100

## 2011-08-26 MED ORDER — HEPARIN SODIUM (PORCINE) 1000 UNIT/ML IJ SOLN
INTRAMUSCULAR | Status: AC
  Filled 2011-08-26: qty 1

## 2011-08-26 MED ORDER — HEPARIN SODIUM (PORCINE) 1000 UNIT/ML IJ SOLN
INTRAMUSCULAR | Status: DC | PRN
  Administered 2011-08-26: 7 mL

## 2011-08-26 MED ORDER — LIDOCAINE HCL (PF) 1 % IJ SOLN
INTRAMUSCULAR | Status: AC
  Filled 2011-08-26: qty 30

## 2011-08-26 MED ORDER — SODIUM CHLORIDE 0.9 % IR SOLN
Status: DC | PRN
  Administered 2011-08-26: 14:00:00

## 2011-08-26 MED ORDER — FENTANYL CITRATE 0.05 MG/ML IJ SOLN
INTRAMUSCULAR | Status: DC | PRN
  Administered 2011-08-26: 25 ug via INTRAVENOUS

## 2011-08-26 MED ORDER — LIDOCAINE HCL (CARDIAC) 20 MG/ML IV SOLN
INTRAVENOUS | Status: DC | PRN
  Administered 2011-08-26: 30 mg via INTRAVENOUS

## 2011-08-26 MED ORDER — 0.9 % SODIUM CHLORIDE (POUR BTL) OPTIME
TOPICAL | Status: DC | PRN
  Administered 2011-08-26: 1000 mL

## 2011-08-26 MED ORDER — MEPERIDINE HCL 25 MG/ML IJ SOLN: 6.2500 mg | INTRAMUSCULAR | Status: DC | PRN

## 2011-08-26 MED ORDER — MIDAZOLAM HCL 2 MG/2ML IJ SOLN: 0.5000 mg | Freq: Once | INTRAMUSCULAR | Status: DC | PRN

## 2011-08-26 MED ORDER — ALBUMIN HUMAN 25 % IV SOLN
INTRAVENOUS | Status: AC
  Administered 2011-08-26: 25 g via INTRAVENOUS
  Filled 2011-08-26: qty 100

## 2011-08-26 MED ORDER — OXYCODONE-ACETAMINOPHEN 5-325 MG PO TABS
1.0000 | ORAL_TABLET | ORAL | Status: DC | PRN
  Administered 2011-08-27: 1 via ORAL
  Filled 2011-08-26: qty 1

## 2011-08-26 MED ORDER — SODIUM CHLORIDE 0.9 % IV SOLN
INTRAVENOUS | Status: DC | PRN
  Administered 2011-08-26: 14:00:00 via INTRAVENOUS

## 2011-08-26 MED ORDER — PARICALCITOL 5 MCG/ML IV SOLN
INTRAVENOUS | Status: AC
  Administered 2011-08-26: 4 ug via INTRAVENOUS
  Filled 2011-08-26: qty 1

## 2011-08-26 MED ORDER — PROPOFOL 10 MG/ML IV EMUL
INTRAVENOUS | Status: DC | PRN
  Administered 2011-08-26: 50 ug/kg/min via INTRAVENOUS

## 2011-08-26 MED ORDER — PROMETHAZINE HCL 25 MG/ML IJ SOLN: 6.2500 mg | INTRAMUSCULAR | Status: DC | PRN

## 2011-08-26 MED ORDER — PARICALCITOL 5 MCG/ML IV SOLN
4.0000 ug | INTRAVENOUS | Status: DC
  Administered 2011-08-26 – 2011-09-02 (×4): 4 ug via INTRAVENOUS
  Filled 2011-08-26 (×4): qty 0.8

## 2011-08-26 MED ORDER — LIDOCAINE HCL (PF) 1 % IJ SOLN
INTRAMUSCULAR | Status: DC | PRN
  Administered 2011-08-26: 12 mL

## 2011-08-26 SURGICAL SUPPLY — 51 items
ADH SKN CLS APL DERMABOND .7 (GAUZE/BANDAGES/DRESSINGS) ×1
BAG BANDED W/RUBBER/TAPE 36X54 (MISCELLANEOUS) ×1 IMPLANT
BAG DECANTER FOR FLEXI CONT (MISCELLANEOUS) ×2 IMPLANT
BAG EQP BAND 135X91 W/RBR TAPE (MISCELLANEOUS) ×1
CATH CANNON HEMO 15F 50CM (CATHETERS) ×1 IMPLANT
CATH CANNON HEMO 15FR 19 (HEMODIALYSIS SUPPLIES) IMPLANT
CATH CANNON HEMO 15FR 23CM (HEMODIALYSIS SUPPLIES) ×2 IMPLANT
CATH CANNON HEMO 15FR 31CM (HEMODIALYSIS SUPPLIES) IMPLANT
CATH CANNON HEMO 15FR 32 (HEMODIALYSIS SUPPLIES) IMPLANT
CATH CANNON HEMO 15FR 32CM (HEMODIALYSIS SUPPLIES) IMPLANT
CHLORAPREP W/TINT 26ML (MISCELLANEOUS) ×2 IMPLANT
CLOTH BEACON ORANGE TIMEOUT ST (SAFETY) ×2 IMPLANT
COVER DOME SNAP 22 D (MISCELLANEOUS) ×1 IMPLANT
COVER PROBE W GEL 5X96 (DRAPES) ×2 IMPLANT
COVER SURGICAL LIGHT HANDLE (MISCELLANEOUS) ×2 IMPLANT
DECANTER SPIKE VIAL GLASS SM (MISCELLANEOUS) ×1 IMPLANT
DERMABOND ADVANCED (GAUZE/BANDAGES/DRESSINGS) ×1
DERMABOND ADVANCED .7 DNX12 (GAUZE/BANDAGES/DRESSINGS) IMPLANT
DRAPE C-ARM 42X72 X-RAY (DRAPES) ×2 IMPLANT
DRAPE CHEST BREAST 15X10 FENES (DRAPES) ×2 IMPLANT
GAUZE SPONGE 2X2 8PLY STRL LF (GAUZE/BANDAGES/DRESSINGS) ×1 IMPLANT
GAUZE SPONGE 4X4 16PLY XRAY LF (GAUZE/BANDAGES/DRESSINGS) ×2 IMPLANT
GLOVE BIO SURGEON STRL SZ7.5 (GLOVE) ×2 IMPLANT
GLOVE BIOGEL PI IND STRL 6.5 (GLOVE) IMPLANT
GLOVE BIOGEL PI IND STRL 7.5 (GLOVE) IMPLANT
GLOVE BIOGEL PI INDICATOR 6.5 (GLOVE) ×1
GLOVE BIOGEL PI INDICATOR 7.5 (GLOVE) ×1
GLOVE SURG SS PI 7.5 STRL IVOR (GLOVE) ×1 IMPLANT
GOWN PREVENTION PLUS XLARGE (GOWN DISPOSABLE) ×2 IMPLANT
GOWN STRL NON-REIN LRG LVL3 (GOWN DISPOSABLE) ×4 IMPLANT
KIT BASIN OR (CUSTOM PROCEDURE TRAY) ×2 IMPLANT
KIT ROOM TURNOVER OR (KITS) ×2 IMPLANT
NDL 18GX1X1/2 (RX/OR ONLY) (NEEDLE) ×1 IMPLANT
NDL HYPO 25GX1X1/2 BEV (NEEDLE) ×1 IMPLANT
NEEDLE 18GX1X1/2 (RX/OR ONLY) (NEEDLE) ×2 IMPLANT
NEEDLE HYPO 25GX1X1/2 BEV (NEEDLE) ×2 IMPLANT
NS IRRIG 1000ML POUR BTL (IV SOLUTION) ×2 IMPLANT
PACK SURGICAL SETUP 50X90 (CUSTOM PROCEDURE TRAY) ×2 IMPLANT
PAD ARMBOARD 7.5X6 YLW CONV (MISCELLANEOUS) ×4 IMPLANT
SET MICROPUNCTURE 5F STIFF (MISCELLANEOUS) ×1 IMPLANT
SPONGE GAUZE 2X2 STER 10/PKG (GAUZE/BANDAGES/DRESSINGS) ×2
SUT ETHILON 3 0 PS 1 (SUTURE) ×2 IMPLANT
SUT VICRYL 4-0 PS2 18IN ABS (SUTURE) ×2 IMPLANT
SYR 20CC LL (SYRINGE) ×4 IMPLANT
SYR 30ML LL (SYRINGE) IMPLANT
SYR 5ML LL (SYRINGE) ×4 IMPLANT
SYR CONTROL 10ML LL (SYRINGE) ×2 IMPLANT
SYRINGE 10CC LL (SYRINGE) ×2 IMPLANT
TOWEL OR 17X24 6PK STRL BLUE (TOWEL DISPOSABLE) ×2 IMPLANT
TOWEL OR 17X26 10 PK STRL BLUE (TOWEL DISPOSABLE) ×2 IMPLANT
WATER STERILE IRR 1000ML POUR (IV SOLUTION) ×2 IMPLANT

## 2011-08-26 NOTE — Consult Note (Addendum)
Reason for Consult: Bradycardia, ? Recurrent endocarditis  Requesting Physician: Teaching Service  HPI: This is a 56 y.o. 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 She was seen by Dr Debara Pickett then but has not followed up as an OP. She was Rx'd for MSSA endocarditis with home IV ABs. During that admission we did do a Myoview which showed inferior HK but no ischemia. 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 She also had NSVT and we put her on a low dose of beta blocker. She has baseline sinus bradycardia and a history of orthostatic hypotension.  She is readmitted now with fever and abdominal pain - no N/V/D. Work up has revealed enterococcus on blood culture X 3. TTE showed no MV vegetation and an EF of 50-55%. She is bradycardic on telemetry with rates down into the mid 30's at times. She does have orthostatic hypotension as well. She has off her beta blocker since admission.  PMHx:  Past Medical History  Diagnosis Date  . Human immunodeficiency virus (HIV) disease   . Hypertension   . ESRD (end stage renal disease)   . Dialysis patient     pt on dialysis since 2010.  [name] Clostridium difficile infection 04/04/10    07/2011 and 08/2011  . Bacteriuria, asymptomatic 04/04/10    Culture grew VRE sensitive to linesolid   . MGUS (monoclonal gammopathy of unknown significance)   . History of bacteremia     MSSA  . Renal insufficiency   . Anemia   . Bacteremia     05/2011 and 08/2011   Past Surgical History  Procedure Date  . Av fistula placement   . Insertion of dialysis catheter 12/30/2010    Procedure: INSERTION OF DIALYSIS CATHETER;  Surgeon: Elam Dutch, MD;  Location: [facility];  Service: Vascular;  Laterality: Left;  Insertion of left internal jugular dialysis catheter  . Vascular surgery   . Insertion of dialysis catheter 05/26/2011    Procedure: INSERTION OF DIALYSIS CATHETER;  Surgeon: Conrad Mahomet, MD;   Location: [facility] OR;  Service: Vascular;  Laterality: N/A;  Insertion tunneled dialysis catheter in Right Internal Jugular with 23 cm catheter     FAMHx: Family History  Problem Relation Age of Onset  . Alcohol abuse      family h/o addiction/alcoholism  . Diabetes Cousin     first degree relatives  . Kidney disease Mother   . Kidney disease Maternal Uncle   . Cancer Sister     SOCHx:  reports that she has never smoked. She has never used smokeless tobacco. She reports that she does not drink alcohol or use illicit drugs.  ALLERGIES: No Known Allergies  ROS: Pertinent items are noted in HPI.  HOME MEDICATIONS: Prescriptions prior to admission  Medication Sig Dispense Refill  . acetaminophen (TYLENOL) 650 MG CR tablet Take 650 mg by mouth every 8 (eight) hours as needed. For pain.      [name] aspirin 81 MG tablet Take 81 mg by mouth daily.        [name] b complex vitamins tablet Take 1 tablet by mouth daily.        [name] b complex-vitamin c-folic acid (NEPHRO-VITE) 0.8 MG TABS Take 0.8 mg by mouth daily.        . darunavir (PREZISTA) 400 MG tablet Take 2 tablets (800 mg total) by mouth daily with breakfast.  60 tablet  1  .  feeding supplement (ENSURE COMPLETE) LIQD Take 237 mLs by mouth 2 (two) times daily between meals.  60 Bottle  11  . lamiVUDine (EPIVIR) 10 MG/ML solution Take 1 teaspoon by mouth every day.       . metoprolol succinate (TOPROL-XL) 12.5 mg TB24 Take 0.5 tablets (12.5 mg total) by mouth at bedtime.  30 tablet  11  . Nutritional Supplements (FEEDING SUPPLEMENT, NEPRO CARB STEADY,) LIQD Take 237 mLs by mouth as needed (missed meal during dialysis.).  30 Can  11  . ritonavir (NORVIR) 100 MG TABS Take 100 mg by mouth daily with breakfast.       . Stavudine (ZERIT) 1 MG/ML SOLR Take 4 teaspoons(20 ml) by mouth every day.       . darbepoetin (ARANESP, ALBUMIN FREE,) 150 MCG/0.3ML SOLN Inject 150 mcg into the skin. Use weekly for Epogen 20000 units. Dialysis nurse to administer.         . iron sucrose (VENOFER) 20 MG/ML injection 100 mg IV on Tuesday HD(HD RN to dispense)       . midodrine (PROAMATINE) 10 MG tablet Take 10 mg by mouth 2 (two) times daily at 10 AM and 5 PM.      . paricalcitol (ZEMPLAR) 5 MCG/ML injection 4 micrograms IV- dispensed by dialysis nurse.         HOSPITAL MEDICATIONS: I have reviewed the patient's current medications.  VITALS: Blood pressure 143/77, pulse 47, temperature 98.1 F (36.7 C), temperature source Oral, resp. rate 18, height 5\' 3"  (1.6 m), weight 48 kg (105 lb 13.1 oz), SpO2 96.00%.  PHYSICAL EXAM: General appearance: alert, cooperative, no distress and chronically ill appearing Neck: no carotid bruit and no JVD Lungs: clear to auscultation bilaterally Heart: regular rate and rhythm - bradycardic, No M/R/G Abdomen: not distended Extremities: no evidence of peripheral emboli, no edema Pulses: diminnished Skin: cool and dry Neurologic: Grossly normal  LABS: Results for orders placed during the hospital encounter of 08/21/11 (from the past 48 hour(s))  RENAL FUNCTION PANEL     Status: Abnormal   Collection Time   08/25/11  6:00 AM      Component Value Range Comment   Sodium 132 (*) 135 - 145 mEq/L    Potassium 4.5  3.5 - 5.1 mEq/L    Chloride 97  96 - 112 mEq/L    CO2 27  19 - 32 mEq/L    Glucose, Bld 72  70 - 99 mg/dL    BUN 27 (*) 6 - 23 mg/dL    Creatinine, Ser 4.67 (*) 0.50 - 1.10 mg/dL    Calcium 9.0  8.4 - 10.5 mg/dL    Phosphorus 2.8  2.3 - 4.6 mg/dL    Albumin 1.9 (*) 3.5 - 5.2 g/dL    GFR calc non Af Amer 10 (*) >90 mL/min    GFR calc Af Amer 11 (*) >90 mL/min   CBC WITH DIFFERENTIAL     Status: Abnormal   Collection Time   08/25/11  6:00 AM      Component Value Range Comment   WBC 3.5 (*) 4.0 - 10.5 K/uL    RBC 3.67 (*) 3.87 - 5.11 MIL/uL    Hemoglobin 11.3 (*) 12.0 - 15.0 g/dL    HCT 36.6  36.0 - 46.0 %    MCV 99.7  78.0 - 100.0 fL    MCH 30.8  26.0 - 34.0 pg    MCHC 30.9  30.0 - 36.0 g/dL    RDW 16.5  (*)  11.5 - 15.5 %    Platelets 222  150 - 400 K/uL    Neutrophils Relative 36 (*) 43 - 77 %    Lymphocytes Relative 46  12 - 46 %    Monocytes Relative 16 (*) 3 - 12 %    Eosinophils Relative 1  0 - 5 %    Basophils Relative 1  0 - 1 %    Neutro Abs 1.3 (*) 1.7 - 7.7 K/uL    Lymphs Abs 1.6  0.7 - 4.0 K/uL    Monocytes Absolute 0.6  0.1 - 1.0 K/uL    Eosinophils Absolute 0.0  0.0 - 0.7 K/uL    Basophils Absolute 0.0  0.0 - 0.1 K/uL    WBC Morphology ATYPICAL LYMPHOCYTES     CBC     Status: Abnormal   Collection Time   08/26/11  7:55 AM      Component Value Range Comment   WBC 3.6 (*) 4.0 - 10.5 K/uL    RBC 3.75 (*) 3.87 - 5.11 MIL/uL    Hemoglobin 11.5 (*) 12.0 - 15.0 g/dL    HCT 37.7  36.0 - 46.0 %    MCV 100.5 (*) 78.0 - 100.0 fL    MCH 30.7  26.0 - 34.0 pg    MCHC 30.5  30.0 - 36.0 g/dL    RDW 16.4 (*) 11.5 - 15.5 %    Platelets 243  150 - 400 K/uL   RENAL FUNCTION PANEL     Status: Abnormal   Collection Time   08/26/11  7:55 AM      Component Value Range Comment   Sodium 130 (*) 135 - 145 mEq/L    Potassium 5.2 (*) 3.5 - 5.1 mEq/L    Chloride 95 (*) 96 - 112 mEq/L    CO2 25  19 - 32 mEq/L    Glucose, Bld 76  70 - 99 mg/dL    BUN 33 (*) 6 - 23 mg/dL    Creatinine, Ser 5.65 (*) 0.50 - 1.10 mg/dL    Calcium 8.9  8.4 - 10.5 mg/dL    Phosphorus 3.1  2.3 - 4.6 mg/dL    Albumin 1.9 (*) 3.5 - 5.2 g/dL    GFR calc non Af Amer 8 (*) >90 mL/min    GFR calc Af Amer 9 (*) >90 mL/min   DIFFERENTIAL     Status: Abnormal   Collection Time   08/26/11  7:55 AM      Component Value Range Comment   Neutrophils Relative 27 (*) 43 - 77 %    Lymphocytes Relative 57 (*) 12 - 46 %    Monocytes Relative 12  3 - 12 %    Eosinophils Relative 2  0 - 5 %    Basophils Relative 2 (*) 0 - 1 %    Neutro Abs 1.0 (*) 1.7 - 7.7 K/uL    Lymphs Abs 2.0  0.7 - 4.0 K/uL    Monocytes Absolute 0.4  0.1 - 1.0 K/uL    Eosinophils Absolute 0.1  0.0 - 0.7 K/uL    Basophils Absolute 0.1  0.0 - 0.1 K/uL     WBC Morphology ATYPICAL LYMPHOCYTES   PLASMACYTOID LYMPHS    IMAGING: No results found.  IMPRESSION:  Principal Problem:  *Bacteremia, ? Recurrent endocarditis  Active Problems:  Bradycardia, HR down to 36 on low dose beta blocker  HIV INFECTION  ESRD (end stage renal disease)  Endocarditis by TTE 05/21/11  C.  difficile colitis July 2013  Hypotension, orthostatic  ANEMIA OF CHRONIC DISEASE  Pancytopenia  Protein calorie malnutrition  Cardiomyopathy, EF 40-45% May 2013, 50-55% Aug 2013  Abnormal EKG, chronic AS TWI, Myoview felt to be low risk May 2013  NSVT May 2913, put on low dose beta blocker   RECOMMENDATION: Dr Ellyn Hack to see, further recommendations to follow.  Time Spent Directly with Patient: 45 minutes  KILROY,LUKE K 08/26/2011, 11:04 AM   ATTENDING ATTESTATION:  I have seen and examined the patient along with Kerin Ransom, PA.  I have reviewed the chart, notes and new data.  I agree with Luke's findings & examination as noted above.  Brief Description: Very unfortunate, chronically ill 56 y/o woman with HIV (on HAART), ESRD on HD (acces limited , requires HD catheter) & likely NICM (although current Echo with improved EF -- need to review personally) - with recent MV Endocarditis who is now admitted with [facility] & 3of 3 + Blood Cx for Enterococcus (? VRE). During her last admission, she was started on low dose Toprol XL (12.5mg ) for NSVT, but was also troubled by bradycardia.  We are re-consulted to assist with bradycardia - rates on Tele as low as 34 while in bed.  The rhythm is clearly sinus bradycardia with no significant AV Nodal block.  Her 2D echo did not suggest an obvious vegetation.  She is having her HD cathter exchanged today -- potentially removing the infectious nidus.  Previous catheter removed - with placement of new catheter.  PLAN:  With a history of endocarditis & + Blood Cx, I will ask my partner, Dr. Debara Pickett to consider TEE to confirm that the  absence of endocarditis in this HD catheter related bacteremia patient with recent endocarditis.  I am concerned with the bradycardia that she may have potential peri-valvular absess (or HD catheter related Right side valvular endocarditis) that is affecting the SA Node.    I would not re-institute any rate control agent on her -- would expect any residual effect of Toprol to wear off soon.  Unfortunately, she is not a viable PPM candidate for multiple reasons -- access sites & need for HD catheter dialysis not withstanding -- especially with her recurrent bacteremia that will require at least 6+ weeks of Abx (Linezolid) to treat empirically.  Would continue to monitor on telemetry with Zoll pads nearby.  Currently her average rates are in the 40s-50s while in bed & she is asymptomatic.  Leonie Man, M.D., M.S. [facility] [address] [place], [place]  [address]  [phone]  08/26/2011 3:16 PM

## 2011-08-26 NOTE — Progress Notes (Signed)
Internal Medicine Attending  Date: 08/26/2011  Patient name: [name] Medical record number: [id number] Date of birth: 29-Apr-1955 Age: 56 y.o. Gender: female I saw and evaluated the patient on AM rounds with house staff. I reviewed the resident's note by Dr. Aundra Dubin and I agree with the resident's findings and plans as documented in her note.

## 2011-08-26 NOTE — Progress Notes (Signed)
Clinical Social Work Department BRIEF PSYCHOSOCIAL ASSESSMENT 08/26/2011  Patient:  SERGIO, DUCHNOWSKI A     Account Number:  [id number]     Admit date:  08/21/2011  Clinical Social Worker:  Glenice Laine  Date/Time:  08/26/2011 11:30 AM  Referred by:  Physician  Date Referred:  08/26/2011 Referred for  SNF Placement   Other Referral:   Interview type:  Family Other interview type:    PSYCHOSOCIAL DATA Living Status:  [place] Admitted from facility:   Level of care:   Primary support name:  Sofee Mayne Primary support relationship to patient:  SIBLING Degree of support available:   Strong    CURRENT CONCERNS Current Concerns  Post-Acute Placement   Other Concerns:    SOCIAL WORK ASSESSMENT / PLAN CSW contacted pt sister to discuss placement as well as POA paperwork and code status. This family is very well known to this CSW and is followed well by OPCSW-  Pt and family have sought placement upon previous admission however due to lack of medicaid beds in [place] pt and family have always declined.  OPCSW has located a SNF in [place] and CSW has confirmed that pt will d/c to SNF however only if unable to participate in CIR-  Pt and family first choice is CIR post op.  CSW will follow and facilitate d/c planning-   Assessment/plan status:  Psychosocial Support/Ongoing Assessment of Needs Other assessment/ plan:   Information/referral to community resources:   SNF-[facility] [employer]    PATIENT'S/FAMILY'S RESPONSE TO PLAN OF CARE: Pt sister will be bringing in [place] documentation prior to the end of the day-P Both pt and sister have relayed DNR status-  Pt and family all in agreement that CIR is best rehab option for pt and this is first choice-  CSW will follow for d/c planning PRN-  Gerre Scull, [phone]

## 2011-08-26 NOTE — Op Note (Signed)
Procedure: Attempted right and left IJ Diatek   Ultrasound-guided insertion of right femoral Diatek catheter  Preoperative diagnosis: End-stage renal disease  Postoperative diagnosis: Same  Anesthesia: General  Operative findings: 50 cm Diatek catheter right femoral vein  Operative details: After obtaining informed consent, the patient was taken to the operating room. The patient was placed in supine position on the operating room table. After adequate sedation the neck and chest were prepped and draped in the usual sterile fashion. Ultrasound was used to identify the left internal jugular vein. This was quite small. Several attempts were made to cannulate the internal jugular vein under ultrasound guidance using an introducer needle and a micropuncture needle. I was able to cannulate the vein twice but unable to advance a guidewire. The left carotid artery was inadvertently cannulated placed. Hemostasis was obtained with direct pressure. Attempts on the left side were aborted. Attention was then turned to the right side of the neck. The patient again had similar small right internal greater vein. Several attempts were made to try to cannulate the right internal jugular vein which were unsuccessful. At this point attempts in the neck were aborted.   Both groins were prepped and draped in usual sterile fashion. The patient was placed in reverse Trendelenburg position. Ultrasound was used to identify the patient's right femoral vein. This had normal compressibility and respiratory variation. Local anesthesia was infiltrated over the right femoral vein.  Using ultrasound guidance, the right femoral vein was successfully cannulated.  A 0.035 J-tipped guidewire was threaded into the right femoral vein and into the inferior vena cava followed by the right atrium under fluoroscopic guidance.   Next sequential 12 and 14 dilators were placed over the guidewire into the right femoral vein.  A 16 French dilator  with a peel-away sheath was then placed over the guidewire into the right femoral vein.   The guidewire and dilator were removed. A 50 cm Diatek catheter was then placed through the peel away sheath into the right atrium.  The catheter was then tunneled subcutaneously, cut to length, and the hub attached. The catheter was noted to flush and draw easily. The catheter was inspected under fluoroscopy and found with its tip to be in the right atrium without any kinks throughout its course. The catheter was sutured to the skin with nylon sutures. The groin insertion site was closed with Vicryl stitch. The catheter was then loaded with concentrated Heparin solution. A dry sterile dressing was applied.  The patient tolerated procedure well and there were no complications. Instrument sponge and needle counts were correct at the end of the case. The patient was taken to the recovery room in stable condition. Chest x-ray will be obtained in the recovery room.  Ruta Hinds, MD [facility] Office: [phone] Pager: [phone]

## 2011-08-26 NOTE — Progress Notes (Addendum)
Subjective: Pt reports 4 episodes of diarrhea yesterday and 2 episodes today.  She denies ab pain  Objective: Vital signs in last 24 hours: Filed Vitals:   08/25/11 2114 08/26/11 0152 08/26/11 0433 08/26/11 0815  BP: 158/78 138/79 150/75 150/73  Pulse: 47 47 46 51  Temp: 98.3 F (36.8 C) 97.7 F (36.5 C) 97.9 F (36.6 C) 98.1 F (36.7 C)  TempSrc: Oral Oral Oral Oral  Resp: 20 16 18 18   Height: 5\' 3"  (1.6 m)     Weight: 105 lb 13.1 oz (48 kg)     SpO2: 100% 100% 96% 96%   Weight change: 0 lb (0 kg)  Intake/Output Summary (Last 24 hours) at 08/26/11 0956 Last data filed at 08/26/11 0805  Gross per 24 hour  Intake    540 ml  Output      0 ml  Net    540 ml   Vitals reviewed. General: resting in bed, NAD HEENT: [place]/AT no scleral icterus Cardiac: +systolic murmur, bradycardic  Pulm: clear to auscultation bilaterally, no wheezes, rales, or rhonchi Abd: soft, nontender, nondistended, BS present (normal) Ext: warm and well perfused, no pedal edema, scds intact Neuro: alert and oriented X3  Lab Results: Basic Metabolic Panel:  Lab [id number] 08/25/11 0600 08/22/11 0100  NA 130* 132* --  K 5.2* 4.5 --  CL 95* 97 --  CO2 25 27 --  GLUCOSE 76 72 --  BUN 33* 27* --  CREATININE 5.65* 4.67* --  CALCIUM 8.9 9.0 --  MG -- -- 1.7  PHOS 3.1 2.8 --   Liver Function Tests:  Lab 08/26/11 0755 08/25/11 0600 08/21/11 2342  AST -- -- 26  ALT -- -- 5  ALKPHOS -- -- 120*  BILITOT -- -- 0.3  PROT -- -- 8.9*  ALBUMIN 1.9* 1.9* --    Lab 08/22/11 0100  LIPASE 40  AMYLASE --   CBC:  Lab 08/26/11 0755 08/25/11 0600  WBC 3.6* 3.5*  NEUTROABS 1.0* 1.3*  HGB 11.5* 11.3*  HCT 37.7 36.6  MCV 100.5* 99.7  PLT 243 222   CBG:  Lab 08/23/11 1423 08/23/11 1341 08/22/11 0503  GLUCAP 97 63* 72     Misc. Labs: +C. dificile  +blood cultures x 4 enterococcus, VRE x 2   Micro Results: Recent Results (from the past 240 hour(s))  CULTURE, BLOOD (ROUTINE X 2)     Status:  Normal   Collection Time   08/21/11 11:35 PM      Component Value Range Status Comment   Specimen Description BLOOD LEFT ARM   Final    Special Requests BOTTLES DRAWN AEROBIC AND ANAEROBIC 5ML   Final    Culture  Setup Time 08/22/2011 04:58   Final    Culture     Final    Value: VANCOMYCIN RESISTANT ENTEROCOCCUS ISOLATED     Note: CRITICAL RESULT CALLED TO, READ BACK BY AND VERIFIED WITH: ASHLEY@ 1025 08/25/11 BY KRAWS     Note: Gram Stain Report Called to,Read Back By and Verified With: KIMBERLY PORTER @ 2150 ON 08/22/11 BY GOLLD   Report Status 08/25/2011 FINAL   Final    Organism ID, Bacteria VANCOMYCIN RESISTANT ENTEROCOCCUS ISOLATED   Final   CULTURE, BLOOD (ROUTINE X 2)     Status: Normal   Collection Time   08/21/11 11:59 PM      Component Value Range Status Comment   Specimen Description BLOOD LEFT HAND   Final    Special Requests  BOTTLES DRAWN AEROBIC AND ANAEROBIC 5CC   Final    Culture  Setup Time 08/22/2011 04:59   Final    Culture     Final    Value: ENTEROCOCCUS SPECIES     Note: SUSCEPTIBILITIES PERFORMED ON PREVIOUS CULTURE WITHIN THE LAST 5 DAYS.     Note: Gram Stain Report Called to,Read Back By and Verified With: KIMBERLY PORTER @ 2150 ON 08/22/11 BY GOLLD   Report Status 08/25/2011 FINAL   Final   MRSA PCR SCREENING     Status: Normal   Collection Time   08/22/11  5:24 AM      Component Value Range Status Comment   MRSA by PCR NEGATIVE  NEGATIVE Final   CLOSTRIDIUM DIFFICILE BY PCR     Status: Abnormal   Collection Time   08/22/11 10:48 AM      Component Value Range Status Comment   C difficile by pcr POSITIVE (*) NEGATIVE Final   CULTURE, BLOOD (ROUTINE X 2)     Status: Normal   Collection Time   08/22/11 11:15 PM      Component Value Range Status Comment   Specimen Description BLOOD RIGHT ARM   Final    Special Requests BOTTLES DRAWN AEROBIC AND ANAEROBIC 10CC   Final    Culture  Setup Time 08/23/2011 05:11   Final    Culture     Final    Value: ENTEROCOCCUS  SPECIES     Note: SUSCEPTIBILITIES PERFORMED ON PREVIOUS CULTURE WITHIN THE LAST 5 DAYS.     Note: Gram Stain Report Called to,Read Back By and Verified With: RN K. GENGLER ON 08/23/11 AT 2145 BY DTERRY   Report Status 08/25/2011 FINAL   Final   CULTURE, BLOOD (ROUTINE X 2)     Status: Normal   Collection Time   08/22/11 11:20 PM      Component Value Range Status Comment   Specimen Description BLOOD RIGHT ARM   Final    Special Requests BOTTLES DRAWN AEROBIC AND ANAEROBIC 10CC   Final    Culture  Setup Time 08/23/2011 05:11   Final    Culture     Final    Value: ENTEROCOCCUS SPECIES     Note: SUSCEPTIBILITIES PERFORMED ON PREVIOUS CULTURE WITHIN THE LAST 5 DAYS.     Note: Gram Stain Report Called to,Read Back By and Verified With: RN K. GENGLER ON 08/23/11 AT 2145 BY DTERRY   Report Status 08/25/2011 FINAL   Final   CATH TIP CULTURE     Status: Normal (Preliminary result)   Collection Time   08/23/11  2:19 PM      Component Value Range Status Comment   Specimen Description CATH TIP   Final    Special Requests NONE   Final    Culture 80 COLONIES ENTEROCOCCUS SPECIES   Final    Report Status PENDING   Incomplete    Studies/Results: No results found. Medications:  Scheduled Meds:    . aspirin  81 mg Oral Daily  . cefUROXime (ZINACEF)  IV  1.5 g Intravenous On Call to OR  . darbepoetin  100 mcg Intravenous Q Sat-HD  . darunavir  800 mg Oral Q breakfast  . feeding supplement  237 mL Oral BID BM  . lamiVUDine  50 mg Oral Daily  . linezolid  600 mg Intravenous Q12H  . midodrine  10 mg Oral BID WC  . multivitamin  1 tablet Oral Daily  . pantoprazole  20 mg  Oral Q1200  . paricalcitol  4 mcg Intravenous Q T,Th,Sa-HD  . ritonavir  100 mg Oral Q breakfast  . sodium chloride  3 mL Intravenous Q12H  . stavudine  20 mg Oral Daily  . vancomycin  125 mg Oral QID  . DISCONTD: darbepoetin  100 mcg Intravenous Q7 days  . 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 Diff (07/2011), MGUS, h/o  anemia, h/o pancytopenia with EF 40-45%.  She presented 8/16 with fever and ab pain.    1. Bacteremia -4 blood Cxs aerobic/ anaerobic +enteroccous sensitive to Linezolid, Resistant to Amp, Synergistic with Norva Karvonen -VRE + in blood cultures  -HD cath removed 8/17 culture tip +enterococcus  -prev echo 05/2011 EF 40-45%, mild to mod reduced systolic function, mod LVH, mild MR, mild AR, +chordae tendineae related small bright mobile area which vegetation could not be excluded -Abx history from May 2013 from [place]: Zosyn 05/2011 x 1 dose, Vanc 05/18/11 x 1 dose and Ancef 05/26/11 x 1 dose; records from [facility] documented prev MSSA bacteremia 5/13 tx'ed with ancef TTHSat but unclear duration and start date. -TTE 8/17 shows EF 50-55%, mild LVH, grade 1 diastolic dysfunction, mild AR, trivial pericardial effusion ant. heart  Plan -Pt Rx Zyvoxx 60 mg iv since 8/18; previously received Rocephin 1 g q24 hours iv and Vancomycin iv 500 mg x 3 days this admission -will consult cardiology for TEE and asymptomatic bradycardia 8/20 -ID consulted and following appreciate recs; they rec TEE and blood cultures 8/19; rec treating pt for 8 weeks for VRE bacteremia (with Zyvoxx or Syercid) and in conjunction with tx'ing C.diff x duration treating for bacteremia plus 2 more weeks; ID noted high level of relapse with VRE 40% -ordered repeat blood cultures 8/19 but orders were cancelled by RN staff on 8/19; will get repeat blood cultures 8/20 -renal and VVS following pt  2.  C.diff -still with diarrhea  -isolation precautions and protocol -Rx oral Vancomycin 125 mg qid (since 8/16) will tx for duration of abx therapy for bacteremia plus 2 weeks after discontinue abx for bacteremia   3. Fever -resolved currently -T max 102. 9 this admission -etiology could be multifactorial related to + C diff and/or  bacteremia +blood cultures x 4 gm +enteroccocus and +enterococcus HD cath source as infection -will monitor   4. Bradycardia  -pt is asymptomatic, HR ranging 40s-50s  -will monitor  -Cards consulted   5. History of hypotension -noted on admission but currently resolved bp 150/75 -hold home Lopressor  -Cont Midodrine 10 mg bid  -will monitor   6. HIV INFECTION  -CD4 and viral ct trended up 130; 726,309 respectively from 210 and 4,783 in 07/2011 -home meds (Norvir, Epivir, Prezista,  -ID following will ask them to make further recommendations on HAART tx  -will repeat cd 4 ct today and genotype -no PCP px with Bactrim 3x per week per ID Dr. Tommy Medal  7. ESRD (end stage renal disease) (03/19/2007) -renal consulted and following -Cr. baseline 3-4 range  -HD TTHSat -HD access chest pulled after dialysis 8/17 d/t bacteremia -wanted repeat blood cultures 8/19 ordered but canceled  -  will repeat blood cultures 08/26/11  -Spoke with renal pt getting another access site 8/20 by VVS   8. ANEMIA OF CHRONIC DISEASE -Aransep   9. Pancytopenia  -chronic -will monitor cbc   10. DVT Px -Hep/SCDs -protonix   11. F/E/N -renal following electrolytes -diet ordered   12. 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

## 2011-08-26 NOTE — Interval H&P Note (Signed)
History and Physical Interval Note:  Pt right neck site clean  For new dialysis catheter today  08/26/2011 2:15 PM  [name]  has presented today for surgery, with the diagnosis of ESRD;SEPSIS  The various methods of treatment have been discussed with the patient and family. After consideration of risks, benefits and other options for treatment, the patient has consented to  Procedure(s) (LRB): INSERTION OF DIALYSIS CATHETER (N/A) as a surgical intervention .  The patient's history has been reviewed, patient examined, no change in status, stable for surgery.  I have reviewed the patient's chart and labs.  Questions were answered to the patient's satisfaction.     [name]

## 2011-08-26 NOTE — Anesthesia Preprocedure Evaluation (Addendum)
Anesthesia Evaluation  Patient identified by MRN, date of birth, ID band Patient awake    Reviewed: Allergy & Precautions, H&P , NPO status , Patient's Chart, lab work & pertinent test results, reviewed documented beta blocker date and time   History of Anesthesia Complications Negative for: history of anesthetic complications  Airway Mallampati: II TM Distance: >3 FB Neck ROM: Limited    Dental  (+) Edentulous Upper, Edentulous Lower and Dental Advisory Given   Pulmonary pneumonia - (h/o pneumocystis), resolved,  breath sounds clear to auscultation  Pulmonary exam normal       Cardiovascular hypertension, Pt. on medications Rhythm:Regular Rate:Bradycardia     Neuro/Psych negative neurological ROS     GI/Hepatic negative GI ROS, Neg liver ROS,   Endo/Other  negative endocrine ROS  Renal/GU ESRF and DialysisRenal disease (last dialysis sat, k+ 5.2)     Musculoskeletal   Abdominal   Peds  Hematology  (+) HIV,   Anesthesia Other Findings HIV  Reproductive/Obstetrics                         Anesthesia Physical Anesthesia Plan  ASA: III  Anesthesia Plan: MAC   Post-op Pain Management:    Induction: Intravenous  Airway Management Planned: Simple Face Mask  Additional Equipment:   Intra-op Plan:   Post-operative Plan:   Informed Consent: I have reviewed the patients History and Physical, chart, labs and discussed the procedure including the risks, benefits and alternatives for the proposed anesthesia with the patient or authorized representative who has indicated his/her understanding and acceptance.     Plan Discussed with: CRNA and Surgeon  Anesthesia Plan Comments: (Plan routine monitors, MAC)        Anesthesia Quick Evaluation

## 2011-08-26 NOTE — Preoperative (Signed)
Beta Blockers   Reason not to administer Beta Blockers:Not Applicable 

## 2011-08-26 NOTE — Transfer of Care (Signed)
Immediate Anesthesia Transfer of Care Note  Patient: [name]  Procedure(s) Performed: Procedure(s) (LRB): INSERTION OF DIALYSIS CATHETER (Right)  Patient Location: PACU  Anesthesia Type: MAC  Level of Consciousness: awake, alert  and oriented  Airway & Oxygen Therapy: Patient Spontanous Breathing  Post-op Assessment: Report given to PACU RN and Post -op Vital signs reviewed and stable  Post vital signs: Reviewed and stable  Complications: No apparent anesthesia complications

## 2011-08-26 NOTE — Progress Notes (Signed)
INFECTIOUS DISEASE ATTENDING ADDENDUM:     [facility] for Infectious Disease   Date: 08/26/2011  Patient name: [name]  Medical record number: [id number]  Date of birth: 28-May-1955    This patient has been seen and discussed with the house staff. Please see their note for complete details. I concur with their findings with the following additions/corrections:  Patient needs HIV genotype performed given uncontrolled viremiaBecause of the CDI I dont want to yet start PCP prophylaxis but am hoping her CD4 comes up with resolution of her acute illness and (HOPEFULLY restarting her ARV)  . Agree with TEE given her VRE bacteremia.  Continue to rx her CDI with vancomycin while VRE being rx plus additional 2 weeks.  Agree with SNF placmement when able. Pt needs more allies in her care. She apparently had been hiding her ARV from relatives causing noncompliance.  Alcide Evener 08/26/2011, 5:01 PM

## 2011-08-26 NOTE — Progress Notes (Signed)
Physical Therapy Treatment Patient Details Name: SICLALY HANISCH MRN: [id number] DOB: [birth year] Today's Date: 08/26/2011 Time: 1040-1057 PT Time Calculation (min): 17 min  PT Assessment / Plan / Recommendation Comments on Treatment Session  Pt eager and willing to increase activity, and seemed disappointed that she was unable to amb in hallway due to orthostatic hypotension; Discussed with pt the need to self-monitor for activity tol, and be OOB with assist only; Noted plans for SNF stay at dc from hospital; PT in agreement    Follow Up Recommendations  Skilled nursing facility    Barriers to Discharge        Equipment Recommendations  Defer to next venue    Recommendations for Other Services    Frequency Min 3X/week   Plan Discharge plan needs to be updated    Precautions / Restrictions Precautions Precautions: Fall Precaution Comments: Became dizzy standing at sink; Likely orthostatic hypotension   Pertinent Vitals/Pain Dizzy upon standing for a few minutes at sink; assisted back to bed, BP  88/62 sitting; BP 143/77 back supine; RN notified    Mobility  Bed Mobility Bed Mobility: Supine to Sit;Sitting - Scoot to Edge of Bed Supine to Sit: 5: Supervision Sitting - Scoot to Edge of Bed: 5: Supervision;With rail Details for Bed Mobility Assistance: Pretty smooth transitions, cues for safety Transfers Transfers: Sit to Stand;Stand to Sit Sit to Stand: 4: Min guard (without physical contact) Stand to Sit: 4: Min assist;To bed Details for Transfer Assistance: Stood to RW without physical assist; Assisted pt back to bed with min assist-extremely cloe guard for safety as she was likely hypotensive Ambulation/Gait Ambulation/Gait Assistance: 4: Min assist Ambulation Distance (Feet): 15 Feet Assistive device: Rolling walker;1 person hand held assist Ambulation/Gait Assistance Details: Used Rw to get to sink to wash hands prior to amb in hallway; At sink, pt reported significant  dizziness, and showed unccordinated RW management (markedly different from getting to sink), so assisted pt back to bed with handheld assist to get back quicker Gait Pattern: Step-through pattern    Exercises     PT Diagnosis:    PT Problem List:   PT Treatment Interventions:     PT Goals Acute Rehab PT Goals Time For Goal Achievement: 09/08/11 Potential to Achieve Goals: Good Pt will go Supine/Side to Sit: with modified independence PT Goal: Supine/Side to Sit - Progress: Progressing toward goal Pt will go Sit to Supine/Side: with modified independence PT Goal: Sit to Supine/Side - Progress: Progressing toward goal Pt will go Sit to Stand: with modified independence PT Goal: Sit to Stand - Progress: Progressing toward goal Pt will go Stand to Sit: with modified independence PT Goal: Stand to Sit - Progress: Progressing toward goal Pt will Ambulate: >150 feet;with modified independence;with rolling walker PT Goal: Ambulate - Progress: Not progressing (due to orthostatic hypotension)  Visit Information  Last PT Received On: 08/26/11 Assistance Needed: +1    Subjective Data  Subjective: Wanting to amb in hallway Patient Stated Goal: feel better   Cognition  Overall Cognitive Status: Appears within functional limits for tasks assessed/performed Arousal/Alertness: Awake/alert Orientation Level: Appears intact for tasks assessed Behavior During Session: [facility] for tasks performed    Balance     End of Session PT - End of Session Activity Tolerance: Treatment limited secondary to medical complications (Comment) (Orthostatic hypotension) Patient left: in bed;with call bell/phone within reach (Cardiology PA in to see pt) Nurse Communication: Mobility status   GP     Roney Marion Hamff  [place], [place]  08/26/2011, 4:12 PM

## 2011-08-26 NOTE — Progress Notes (Signed)
Subjective:   No current complaints, awaiting new catheter placement for dialysis.  Objective: Vital signs in last 24 hours: Temp:  [97.5 F (36.4 C)-98.3 F (36.8 C)] 97.9 F (36.6 C) (08/20 0433) Pulse Rate:  [46-48] 46  (08/20 0433) Resp:  [16-20] 18  (08/20 0433) BP: (127-158)/(72-79) 150/75 mmHg (08/20 0433) SpO2:  [96 %-100 %] 96 % (08/20 0433) Weight:  [48 kg (105 lb 13.1 oz)] 48 kg (105 lb 13.1 oz) (08/19 2114) Weight change: 0 kg (0 lb)  Intake/Output from previous day: 08/19 0701 - 08/20 0700 In: 420 [P.O.:120; IV Piggyback:300] Out: -    EXAM: General appearance:  Alert, comfortable, in no apparent distress, but appears chronically ill Resp:  CTA without rales, rhonchi, or wheezes diminished bs.   Cardio:  RRR with Gr II/VI systolic murmur GI:  + BS, soft and nontender liver down 4 cm Extremities:  No edema Access:  Right IJ catheter removed 8/17  Lab Results:  Basename 08/25/11 0600 08/24/11 0535  WBC 3.5* 2.9*  HGB 11.3* 10.2*  HCT 36.6 33.7*  PLT 222 184   BMET:  Basename 08/25/11 0600 08/24/11 0535  NA 132* 135  K 4.5 4.3  CL 97 100  CO2 27 29  GLUCOSE 72 74  BUN 27* 16  CREATININE 4.67* 3.44*  CALCIUM 9.0 8.6  ALBUMIN 1.9* 1.9*   No results found for this basename: PTH:2 in the last 72 hours Iron Studies: No results found for this basename: IRON,TIBC,TRANSFERRIN,FERRITIN in the last 72 hours  Outpatient HD: [facility], 4 hrs, 54 kg EDW. Hep 5900u. EPO 13,000u. Zemplar 4ug. R IJ TDcath. Nepro 1 can each HD. 2K/2.25Ca bath.   Assessment/Plan:  1.    Sepsis - Blood cultures + VRE; hx of endocarditis and MSSA bacteremia in May; PC removed 8/17, tip with enterococci, on Zyvox, Zinacef, and Vancomycin. New catheter placement per VVS today.  Need to determine long term regimen, ? Po Zyvox, and ? Dapto 2.    ESRD - HD on TTS at [facility]; HD 8/17 with net UF 2 L.  HD pending today s/p catheter placement. 3.    Hypotension/volume - takes metoprolol 12.5 qhs  presumably for cardiomyopathy, and takes midodrine 10 mg bid at home; Metoprolol is on hold, Midodrine resumed.  4.    CM - EF 40-45%  5.    HIV - continue current antiretroviral tx, ANC 1.5  6.    End-stage access - no further perm access, new catheter placement today.  7.    Anemia of CKD - cont Aranesp 100, off IV iron. Hgb stable [id number].  8.    Metabolic bone disease - cont Zemplar, off binders. P 2.8, last PTH 225, at goal.  9.    Malnutrition - increased to high protein renal diet. Change supplement to Ensure Plus (her preference). It is higher in K, than nepro, so need to watch 10 C. Diff on po vanco   LOS: 5 days   LYLES,[name] 08/26/2011,8:19 AM

## 2011-08-26 NOTE — Anesthesia Postprocedure Evaluation (Signed)
  Anesthesia Post-op Note  Patient: [name]  Procedure(s) Performed: Procedure(s) (LRB): INSERTION OF DIALYSIS CATHETER (Right)  Patient Location: PACU  Anesthesia Type: MAC  Level of Consciousness: awake, alert  and oriented  Airway and Oxygen Therapy: Patient Spontanous Breathing  Post-op Pain: none  Post-op Assessment: Post-op Vital signs reviewed, Patient's Cardiovascular Status Stable, Respiratory Function Stable, Patent Airway, No signs of Nausea or vomiting and Pain level controlled  Post-op Vital Signs: Reviewed and stable  Complications: No apparent anesthesia complications

## 2011-08-26 NOTE — H&P (View-Only) (Signed)
Asked by Renal Service to pull dialysis catheter for presumed infection.  Currently on dialysis Will return later today or in morning to pull catheter  Ruta Hinds, MD [facility]: [phone] Pager: [phone]

## 2011-08-27 ENCOUNTER — Ambulatory Visit: Payer: Medicaid Other | Admitting: Internal Medicine

## 2011-08-27 ENCOUNTER — Inpatient Hospital Stay (HOSPITAL_COMMUNITY): Payer: Medicaid Other

## 2011-08-27 ENCOUNTER — Encounter (HOSPITAL_COMMUNITY)
Admission: EM | Disposition: A | Discharge status: Home / Self Care | Payer: Self-pay | Source: Home / Self Care | Attending: Internal Medicine

## 2011-08-27 ENCOUNTER — Encounter (HOSPITAL_COMMUNITY): Payer: Self-pay | Admitting: *Deleted

## 2011-08-27 HISTORY — PX: TRANSTHORACIC ECHOCARDIOGRAM: SHX275

## 2011-08-27 HISTORY — PX: TEE WITHOUT CARDIOVERSION: SHX5443

## 2011-08-27 LAB — RENAL FUNCTION PANEL
Albumin: 2.3 g/dL — ABNORMAL LOW (ref 3.5–5.2)
BUN: 27 mg/dL — ABNORMAL HIGH (ref 6–23)
CO2: 28 mEq/L (ref 19–32)
Calcium: 8.8 mg/dL (ref 8.4–10.5)
Chloride: 99 mEq/L (ref 96–112)
Creatinine, Ser: 5.43 mg/dL — ABNORMAL HIGH (ref 0.50–1.10)
GFR calc Af Amer: 9 mL/min — ABNORMAL LOW (ref >90)
GFR calc non Af Amer: 8 mL/min — ABNORMAL LOW (ref >90)
Glucose, Bld: 73 mg/dL (ref 70–99)
Phosphorus: 3.5 mg/dL (ref 2.3–4.6)
Potassium: 5.3 mEq/L — ABNORMAL HIGH (ref 3.5–5.1)
Sodium: 133 mEq/L — ABNORMAL LOW (ref 135–145)

## 2011-08-27 LAB — CATH TIP CULTURE: Culture: 80

## 2011-08-27 LAB — CBC WITH DIFFERENTIAL/PLATELET
Basophils Absolute: 0.1 10*3/uL (ref 0.0–0.1)
Basophils Relative: 1 % (ref 0–1)
Eosinophils Absolute: 0 10*3/uL (ref 0.0–0.7)
Eosinophils Relative: 1 % (ref 0–5)
HCT: 33.7 % — ABNORMAL LOW (ref 36.0–46.0)
Hemoglobin: 10.4 g/dL — ABNORMAL LOW (ref 12.0–15.0)
Lymphocytes Relative: 43 % (ref 12–46)
Lymphs Abs: 1.6 10*3/uL (ref 0.7–4.0)
MCH: 30.6 pg (ref 26.0–34.0)
MCHC: 30.9 g/dL (ref 30.0–36.0)
MCV: 99.1 fL (ref 78.0–100.0)
Monocytes Absolute: 0.5 10*3/uL (ref 0.1–1.0)
Monocytes Relative: 13 % — ABNORMAL HIGH (ref 3–12)
Neutro Abs: 1.6 10*3/uL — ABNORMAL LOW (ref 1.7–7.7)
Neutrophils Relative %: 42 % — ABNORMAL LOW (ref 43–77)
Platelets: 213 10*3/uL (ref 150–400)
RBC: 3.4 MIL/uL — ABNORMAL LOW (ref 3.87–5.11)
RDW: 16.1 % — ABNORMAL HIGH (ref 11.5–15.5)
WBC: 3.9 10*3/uL — ABNORMAL LOW (ref 4.0–10.5)

## 2011-08-27 LAB — T-HELPER CELLS (CD4) COUNT (NOT AT ARMC)
CD4 % Helper T Cell: 16 % — ABNORMAL LOW (ref 33–55)
CD4 T Cell Abs: 310 uL — ABNORMAL LOW (ref 400–2700)

## 2011-08-27 SURGERY — ECHOCARDIOGRAM, TRANSESOPHAGEAL
Anesthesia: Moderate Sedation

## 2011-08-27 MED ORDER — HEPARIN SODIUM (PORCINE) 1000 UNIT/ML DIALYSIS: 20.0000 [IU]/kg | INTRAMUSCULAR | Status: DC | PRN

## 2011-08-27 MED ORDER — SODIUM CHLORIDE 0.9 % IV SOLN
Freq: Once | INTRAVENOUS | Status: AC
  Administered 2011-08-27: 20 mL via INTRAVENOUS

## 2011-08-27 MED ORDER — MIDAZOLAM HCL 5 MG/ML IJ SOLN
INTRAMUSCULAR | Status: AC
  Filled 2011-08-27: qty 2

## 2011-08-27 MED ORDER — FENTANYL CITRATE 0.05 MG/ML IJ SOLN
INTRAMUSCULAR | Status: AC
  Filled 2011-08-27: qty 2

## 2011-08-27 MED ORDER — MIDODRINE HCL 5 MG PO TABS
ORAL_TABLET | ORAL | Status: AC
  Administered 2011-08-27: 10 mg via ORAL
  Filled 2011-08-27: qty 2

## 2011-08-27 MED ORDER — MIDAZOLAM HCL 10 MG/2ML IJ SOLN
INTRAMUSCULAR | Status: DC | PRN
  Administered 2011-08-27 (×2): 1 mg via INTRAVENOUS

## 2011-08-27 MED ORDER — OXYCODONE-ACETAMINOPHEN 5-325 MG PO TABS
1.0000 | ORAL_TABLET | ORAL | Status: DC | PRN
  Administered 2011-09-01 – 2011-09-02 (×3): 2 via ORAL
  Filled 2011-08-27 (×2): qty 2

## 2011-08-27 MED ORDER — LIDOCAINE VISCOUS 2 % MT SOLN
OROMUCOSAL | Status: DC | PRN
  Administered 2011-08-27: 20 mL via OROMUCOSAL

## 2011-08-27 MED ORDER — HEPARIN SODIUM (PORCINE) 5000 UNIT/ML IJ SOLN
5000.0000 [IU] | Freq: Three times a day (TID) | INTRAMUSCULAR | Status: DC
  Administered 2011-08-27 – 2011-08-28 (×2): 5000 [IU] via SUBCUTANEOUS
  Filled 2011-08-27 (×6): qty 1

## 2011-08-27 MED ORDER — FENTANYL CITRATE 0.05 MG/ML IJ SOLN
INTRAMUSCULAR | Status: DC | PRN
  Administered 2011-08-27: 25 ug via INTRAVENOUS

## 2011-08-27 MED ORDER — ALTEPLASE 2 MG IJ SOLR
2.0000 mg | Freq: Once | INTRAMUSCULAR | Status: AC
  Administered 2011-08-27: 2 mg
  Filled 2011-08-27 (×3): qty 2

## 2011-08-27 MED ORDER — LIDOCAINE VISCOUS 2 % MT SOLN
OROMUCOSAL | Status: AC
  Filled 2011-08-27: qty 15

## 2011-08-27 MED ORDER — HEPARIN SODIUM (PORCINE) 1000 UNIT/ML DIALYSIS
20.0000 [IU]/kg | INTRAMUSCULAR | Status: DC | PRN
  Administered 2011-08-28 – 2011-08-30 (×2): 1000 [IU] via INTRAVENOUS_CENTRAL
  Filled 2011-08-27: qty 1

## 2011-08-27 NOTE — Progress Notes (Signed)
Subjective:   Nose bleed last night, but no current complaints; no further diarrhea.  Objective: Vital signs in last 24 hours: Temp:  [96.8 F (36 C)-98.3 F (36.8 C)] 98.3 F (36.8 C) (08/21 0640) Pulse Rate:  [42-70] 52  (08/21 0640) Resp:  [14-18] 16  (08/21 0640) BP: (67-150)/(38-85) 100/56 mmHg (08/21 0640) SpO2:  [92 %-98 %] 94 % (08/21 0640) Weight:  [49.2 kg (108 lb 7.5 oz)] 49.2 kg (108 lb 7.5 oz) (08/20 2154) Weight change: 1.2 kg (2 lb 10.3 oz)  Intake/Output from previous day: 08/20 0701 - 08/21 0700 In: 1110 [P.O.:240; I.V.:270; IV Piggyback:600] Out: 286    EXAM: General appearance:  Alert, comfortable, in no apparent distress, but appears chronically ill Resp:  Decreased breath sounds, but clear Cardio:  RRR with Gr II/VI systolic murmur GI:  + BS, soft and nontender, liver down 4 cm Extremities:  No edema R groin cath Access:  Right femoral catheter  Lab Results:  Basename 08/27/11 0635 08/26/11 0755  WBC 3.9* 3.6*  HGB 10.4* 11.5*  HCT 33.7* 37.7  PLT 213 243   BMET:  Basename 08/27/11 0635 08/26/11 0755  NA 133* 130*  K 5.3* 5.2*  CL 99 95*  CO2 28 25  GLUCOSE 73 76  BUN 27* 33*  CREATININE 5.43* 5.65*  CALCIUM 8.8 8.9  ALBUMIN 2.3* 1.9*   No results found for this basename: PTH:2 in the last 72 hours Iron Studies: No results found for this basename: IRON,TIBC,TRANSFERRIN,FERRITIN in the last 72 hours   Outpatient HD: [facility], 4 hrs, 54 kg EDW. Hep 5900u. EPO 13,000u. Zemplar 4ug. R IJ TDcath. Nepro 1 can each HD. 2K/2.25Ca bath.   Assessment/Plan:  1.    Sepsis - Blood cultures + VRE; hx of endocarditis and MSSA bacteremia in May; PC removed 8/17, tip with enterococci, on IV Zyvox and PO Vancomycin; new catheter placement per VVS yesterday. ? Plans for outpatient regimen, ? Dapto 2.    ESRD - HD on TTS at [facility]; Tx ended 2 hrs 9 mins early yesterday due to poor cath flow, net UF only 286 ml; K 5.3 this AM. HD pending today. Will eval UE veins,  groin cath not a good alternative. 3.    Hypotension/volume - BP most recently 100/56, takes metoprolol 12.5 qhs presumably for cardiomyopathy, and takes midodrine 10 mg bid at home; Metoprolol is on hold, Midodrine resumed.  4.    CM - EF 40-45%  5.    HIV - continue current antiretroviral tx, ANC 1.5. Per IR.  6.    End-stage access - no further perm access, new R femoral catheter placement yesterday.  7.    Anemia of CKD - cont Aranesp 100 mcg on Sat, off IV iron. Hgb stable [id number].  8.    Metabolic bone disease - cont Zemplar, off binders. P 2.8, last PTH 225, at goal.  9.    Malnutrition - increased to high protein renal diet. Change supplement to Ensure Plus (her preference). It is higher in K, than nepro, so need to watch  10.  C. Diff - resolving on po Vanc.   LOS: 6 days   LYLES,CHARLES 08/27/2011,8:12 AM

## 2011-08-27 NOTE — Progress Notes (Signed)
Subjective: No complaints today. For dialysis this afternoon.  Objective: Vital signs in last 24 hours: Temp:  [96.8 F (36 C)-98.3 F (36.8 C)] 98 F (36.7 C) (08/21 0900) Pulse Rate:  [42-70] 55  (08/21 0900) Resp:  [14-18] 16  (08/21 0900) BP: (67-143)/(38-85) 105/52 mmHg (08/21 0900) SpO2:  [92 %-98 %] 95 % (08/21 0900) Weight:  [49.2 kg (108 lb 7.5 oz)] 49.2 kg (108 lb 7.5 oz) (08/20 2154) Weight change: 1.2 kg (2 lb 10.3 oz) Last BM Date: 08/26/11 Intake/Output from previous day: +824 08/20 0701 - 08/21 0700 In: 1110 [P.O.:240; I.V.:270; IV Piggyback:600] Out: 286  Intake/Output this shift:     Lab Results:  Basename 08/27/11 0635 08/26/11 0755  WBC 3.9* 3.6*  HGB 10.4* 11.5*  HCT 33.7* 37.7  PLT 213 243   BMET  Basename 08/27/11 0635 08/26/11 0755  NA 133* 130*  K 5.3* 5.2*  CL 99 95*  CO2 28 25  GLUCOSE 73 76  BUN 27* 33*  CREATININE 5.43* 5.65*  CALCIUM 8.8 8.9   No results found for this basename: TROPONINI:2,CK,MB:2 in the last 72 hours  Lab Results  Component Value Date   CHOL 147 05/30/2011   HDL 12* 05/30/2011   LDLCALC 87 05/30/2011   TRIG 238* 05/30/2011   CHOLHDL 12.3 05/30/2011   Lab Results  Component Value Date   HGBA1C  Value: 5.1 (NOTE)                                                                       According to the ADA Clinical Practice Recommendations for 2011, when HbA1c is used as a screening test:   >=6.5%   Diagnostic of Diabetes Mellitus           (if abnormal result  is confirmed)  5.7-6.4%   Increased risk of developing Diabetes Mellitus  References:Diagnosis and Classification of Diabetes Mellitus,Diabetes Care,2011,34(Suppl 1):S62-S69 and Standards of Medical Care in         Diabetes - 2011,Diabetes [id number]  (Suppl 1):S11-S61. 04/18/2009      Hepatic Function Panel  Basename 08/27/11 0635  PROT --  ALBUMIN 2.3*  AST --  ALT --  ALKPHOS --  BILITOT --  BILIDIR --  IBILI --   No results found for this basename:  CHOL in the last 72 hours No results found for this basename: PROTIME in the last 72 hours    EKG: Orders placed during the hospital encounter of 07/11/11  . EKG    Studies/Results: Dg Chest Port 1 View  08/26/2011  *RADIOLOGY REPORT*  Clinical Data: Right femoral Diatek catheter placement  PORTABLE CHEST - 1 VIEW  Comparison: 07/11/2011  Findings: Femoral catheter has been placed with tips in the right atrium and SVC.  Right jugular catheter has been removed since the prior study.  Cardiac enlargement.  Normal vascularity.  No edema or effusion.  IMPRESSION: Femoral catheter tips in the right atrium and SVC.  Lungs are clear.   Original Report Authenticated By: Truett Perna, M.D.     Medications: I have reviewed the patient's current medications.    [name] albumin human  25 g Intravenous Once  . aspirin  81 mg Oral Daily  . cefUROXime (ZINACEF)  IV  1.5 g Intravenous On Call to OR  . darbepoetin  100 mcg Intravenous Q Sat-HD  . darunavir  800 mg Oral Q breakfast  . feeding supplement  237 mL Oral BID BM  . lamiVUDine  50 mg Oral Daily  . linezolid  600 mg Intravenous Q12H  . midodrine  10 mg Oral BID WC  . multivitamin  1 tablet Oral QHS  . pantoprazole  20 mg Oral Q1200  . paricalcitol  4 mcg Intravenous Q T,Th,Sa-HD  . ritonavir  100 mg Oral Q breakfast  . sodium chloride  3 mL Intravenous Q12H  . stavudine  15 mg Oral Daily  . vancomycin  125 mg Oral QID  . DISCONTD: multivitamin  1 tablet Oral Daily  . 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 difficile colitis  Cardiomyopathy  Abnormal EKG, chronic AS TWI  NSVT (nonsustained ventricular tachycardia)  Hypotension  Bradycardia  PLAN:  Pulse in the 50's.  BP down to 74/48 last pm.now 105/52.  ID and Dr.  Ellyn Hack agree with TEE with her VRE bacteremia.    HR still in the 50's.      Diatek catheter was placed in the rt. Femoral artery yesterday, unsuccessful placement in neck.  LOS: 6 days   INGOLD,LAURA R 08/27/2011, 10:19 AM  Pt. Seen and examined. Agree with the NP/PA-C note as written.  Plan TEE today. Risks and benefits discussed with the patient and she is agreeable to the procedure.  Discussed with nephrology, plan for repeat dialysis session this afternoon.  Pixie Casino, MD, [facility] Attending Cardiologist [facility] [place]

## 2011-08-27 NOTE — CV Procedure (Addendum)
[facility]  TRANSESOPHAGEAL ECHOCARDIOGRAM (TEE) NOTE   INDICATIONS: infective endocarditis  PROCEDURE:   Informed consent was obtained prior to the procedure. The risks, benefits and alternatives for the procedure were discussed and the patient comprehended these risks.  Risks include, but are not limited to, cough, sore throat, vomiting, nausea, somnolence, esophageal and stomach trauma or perforation, bleeding, low blood pressure, aspiration, pneumonia, infection, trauma to the teeth and death.    After a procedural time-out, the patient was given 2 mg versed and 25 mcg fentanyl for moderate sedation.  The oropharynx was anesthetized 10 cc of topical 1% viscous lidocaine.  The transesophageal probe was inserted in the esophagus and stomach without difficulty and multiple views were obtained.  The patient was kept under observation until the patient left the procedure room.  The patient left the procedure room in stable condition.   Agitated microbubble saline contrast was not administered.  COMPLICATIONS:    There were no immediate complications.  FINDINGS:  1. LEFT VENTRICLE: The left ventricle is moderately hypertrophied with reduced LV systolic function to 123456. Global hypokinesis with inferior and inferolateral akinesis. No thrombus or masses.  2. RIGHT VENTRICLE:  The right ventricle is normal in structure and function without any thrombus or masses.    3. LEFT ATRIUM:  The left atrium is normal without any thrombus or masses.  4. LEFT ATRIAL APPENDAGE:  The left atrial appendage is large and unilobular is free of any thrombus or masses. Color doppler shows normal flow and pulse doppler was normal.  5. ATRIAL SEPTUM:  The atrial septum is free of any thrombus or masses.  There is no evidence for interatrial shunting by color doppler.  6. RIGHT ATRIUM:  The right atrium is normal in size and function without any thrombus or masses.  7. MITRAL  VALVE:  The mitral valve is mildly thickened but no obvious mass or vegetation was noted. There is again, apparently a small, calcified mobile mass noted on the chordae tendinae, which may be a partially ruptured cord or old organized thrombus. There is trace to mild regurgitation. .  8. AORTIC VALVE:  The aortic is normal in structure and function with trace central regurgitation.  There were no vegetations or stenosis  9. TRICUSPID VALVE:  The tricuspid valve was not well visualized. There was trace to mild regurgitation.  10. PULMONIC VALVE:  The pulmonic valve is normal in structure and function       without regurgitation.  There were no vegetations or stenosis  11. AORTIC ARCH, ASCENDING AND DESCENDING AORTA:  There was no       atherosclerosis of the descending or ascending aorta. The aortic root was not dilated.  IMPRESSION:   1. No obvious valvular vegetation. 2. Previously visualized calcified, small mobile mass on the chordae tendinae is not likely a vegetation or source of bacteremia. 3. Moderate to severely reduced LV systolic function (EF 123456), global hypokinesis with inferior and inferolateral akinesis  (the reported surface echo conclusion of LVEF is innacurate).  RECOMMENDATIONS:    1. Antibiotic therapy for dialysis-related line infection. 2. Mixed ischemic and non-ischemic cardiomyopathy. Nuclear stress test in 05/2011 showed a large fixed inferior defect which is likely scar.  This is consistent with echo findings today.  3. Without active chest pain or reversible changes on her nuclear stress test, I would manage her heart failure medically.  HR will not tolerate b-blocker. She may be able to tolerate an ACE-I or  ARB, since she is already on dialysis.  She did have NSVT in the past, which is possibly scar related.  Time Spent Directly with the Patient:  15 minutes   Pixie Casino, MD, [facility] Attending Cardiologist [facility] [place]  08/27/2011, 12:07 PM

## 2011-08-27 NOTE — Progress Notes (Signed)
Subjective: Pt reports 1 soft stool today; reports swelling in neck area and pain noted last night after procedure in neck region. Otherwise denies complaints.    RN in HD: low BP 80s/40s but she made nephro aware   Objective: Vital signs in last 24 hours: Filed Vitals:   08/27/11 1548 08/27/11 1600 08/27/11 1615 08/27/11 1630  BP: 93/46 84/41 75/35  78/35  Pulse: 50 71  58  Temp:      TempSrc:      Resp:  16  13  Height:      Weight:      SpO2:       Weight change: 2 lb 10.3 oz (1.2 kg)  Intake/Output Summary (Last 24 hours) at 08/27/11 1738 Last data filed at 08/27/11 1319  Gross per 24 hour  Intake    840 ml  Output    286 ml  Net    554 ml   Vitals reviewed. General: resting in bed, NAD, cachetic HEENT: no scleral icterus, anterior neck with ecchymosis min swelling previous IJ attempts 08/26/11 procedure; no evidence of infection Cardiac: RRR, no rubs, murmurs or gallops Pulm: clear to auscultation bilaterally, no wheezes, rales, or rhonchi Abd: soft, nontender, nondistended, BS present (norma) Ext: warm and well perfused, no pedal edema; right thigh diatek cath no evidence of infection Neuro: alert and oriented X3  Lab Results: Basic Metabolic Panel:  Lab [id number] 08/26/11 0755 08/22/11 0100  NA 133* 130* --  K 5.3* 5.2* --  CL 99 95* --  CO2 28 25 --  GLUCOSE 73 76 --  BUN 27* 33* --  CREATININE 5.43* 5.65* --  CALCIUM 8.8 8.9 --  MG -- -- 1.7  PHOS 3.5 3.1 --   Liver Function Tests:  Lab 08/27/11 0635 08/26/11 0755 08/21/11 2342  AST -- -- 26  ALT -- -- 5  ALKPHOS -- -- 120*  BILITOT -- -- 0.3  PROT -- -- 8.9*  ALBUMIN 2.3* 1.9* --    Lab 08/22/11 0100  LIPASE 40  AMYLASE --   CBC:  Lab 08/27/11 0635 08/26/11 0755  WBC 3.9* 3.6*  NEUTROABS 1.6* 1.0*  HGB 10.4* 11.5*  HCT 33.7* 37.7  MCV 99.1 100.5*  PLT 213 243   CBG:  Lab 08/26/11 1137 08/23/11 1423 08/23/11 1341 08/22/11 0503  GLUCAP 85 97 63* 72     Misc. Labs: no  recent  Micro Results: Recent Results (from the past 240 hour(s))  CULTURE, BLOOD (ROUTINE X 2)     Status: Normal   Collection Time   08/21/11 11:35 PM      Component Value Range Status Comment   Specimen Description BLOOD LEFT ARM   Final    Special Requests BOTTLES DRAWN AEROBIC AND ANAEROBIC 5ML   Final    Culture  Setup Time 08/22/2011 04:58   Final    Culture     Final    Value: VANCOMYCIN RESISTANT ENTEROCOCCUS ISOLATED     Note: CRITICAL RESULT CALLED TO, READ BACK BY AND VERIFIED WITH: ASHLEY@ 1025 08/25/11 BY KRAWS     Note: Gram Stain Report Called to,Read Back By and Verified With: KIMBERLY PORTER @ 2150 ON 08/22/11 BY GOLLD   Report Status 08/25/2011 FINAL   Final    Organism ID, Bacteria VANCOMYCIN RESISTANT ENTEROCOCCUS ISOLATED   Final   CULTURE, BLOOD (ROUTINE X 2)     Status: Normal   Collection Time   08/21/11 11:59 PM      Component  Value Range Status Comment   Specimen Description BLOOD LEFT HAND   Final    Special Requests BOTTLES DRAWN AEROBIC AND ANAEROBIC 5CC   Final    Culture  Setup Time 08/22/2011 04:59   Final    Culture     Final    Value: ENTEROCOCCUS SPECIES     Note: SUSCEPTIBILITIES PERFORMED ON PREVIOUS CULTURE WITHIN THE LAST 5 DAYS.     Note: Gram Stain Report Called to,Read Back By and Verified With: KIMBERLY PORTER @ 2150 ON 08/22/11 BY GOLLD   Report Status 08/25/2011 FINAL   Final   MRSA PCR SCREENING     Status: Normal   Collection Time   08/22/11  5:24 AM      Component Value Range Status Comment   MRSA by PCR NEGATIVE  NEGATIVE Final   CLOSTRIDIUM DIFFICILE BY PCR     Status: Abnormal   Collection Time   08/22/11 10:48 AM      Component Value Range Status Comment   C difficile by pcr POSITIVE (*) NEGATIVE Final   CULTURE, BLOOD (ROUTINE X 2)     Status: Normal   Collection Time   08/22/11 11:15 PM      Component Value Range Status Comment   Specimen Description BLOOD RIGHT ARM   Final    Special Requests BOTTLES DRAWN AEROBIC AND  ANAEROBIC 10CC   Final    Culture  Setup Time 08/23/2011 05:11   Final    Culture     Final    Value: ENTEROCOCCUS SPECIES     Note: SUSCEPTIBILITIES PERFORMED ON PREVIOUS CULTURE WITHIN THE LAST 5 DAYS.     Note: Gram Stain Report Called to,Read Back By and Verified With: RN K. GENGLER ON 08/23/11 AT 2145 BY DTERRY   Report Status 08/25/2011 FINAL   Final   CULTURE, BLOOD (ROUTINE X 2)     Status: Normal   Collection Time   08/22/11 11:20 PM      Component Value Range Status Comment   Specimen Description BLOOD RIGHT ARM   Final    Special Requests BOTTLES DRAWN AEROBIC AND ANAEROBIC 10CC   Final    Culture  Setup Time 08/23/2011 05:11   Final    Culture     Final    Value: ENTEROCOCCUS SPECIES     Note: SUSCEPTIBILITIES PERFORMED ON PREVIOUS CULTURE WITHIN THE LAST 5 DAYS.     Note: Gram Stain Report Called to,Read Back By and Verified With: RN K. GENGLER ON 08/23/11 AT 2145 BY DTERRY   Report Status 08/25/2011 FINAL   Final   CATH TIP CULTURE     Status: Normal   Collection Time   08/23/11  2:19 PM      Component Value Range Status Comment   Specimen Description CATH TIP   Final    Special Requests NONE   Final    Culture     Final    Value: 80 COLONIES VANCOMYCIN RESISTANT ENTEROCOCCUS ISOLATED     Note: RVB TINA O 08/27/11@830  BY REAMM   Report Status 08/27/2011 FINAL   Final    Organism ID, Bacteria VANCOMYCIN RESISTANT ENTEROCOCCUS ISOLATED   Final   CULTURE, BLOOD (ROUTINE X 2)     Status: Normal (Preliminary result)   Collection Time   08/26/11  7:55 AM      Component Value Range Status Comment   Specimen Description BLOOD HAND LEFT   Final    Special Requests BOTTLES DRAWN AEROBIC  ONLY 10CC   Final    Culture  Setup Time 08/26/2011 15:00   Final    Culture     Final    Value:        BLOOD CULTURE RECEIVED NO GROWTH TO DATE CULTURE WILL BE HELD FOR 5 DAYS BEFORE ISSUING A FINAL NEGATIVE REPORT   Report Status PENDING   Incomplete   CULTURE, BLOOD (ROUTINE X 2)     Status:  Normal (Preliminary result)   Collection Time   08/26/11  8:05 AM      Component Value Range Status Comment   Specimen Description BLOOD LEFT FOREARM   Final    Special Requests BOTTLES DRAWN AEROBIC AND ANAEROBIC 10CC EACH   Final    Culture  Setup Time 08/26/2011 15:01   Final    Culture     Final    Value:        BLOOD CULTURE RECEIVED NO GROWTH TO DATE CULTURE WILL BE HELD FOR 5 DAYS BEFORE ISSUING A FINAL NEGATIVE REPORT   Report Status PENDING   Incomplete    Studies/Results: Dg Chest Port 1 View  08/26/2011  *RADIOLOGY REPORT*  Clinical Data: Right femoral Diatek catheter placement  PORTABLE CHEST - 1 VIEW  Comparison: 07/11/2011  Findings: Femoral catheter has been placed with tips in the right atrium and SVC.  Right jugular catheter has been removed since the prior study.  Cardiac enlargement.  Normal vascularity.  No edema or effusion.  IMPRESSION: Femoral catheter tips in the right atrium and SVC.  Lungs are clear.   Original Report Authenticated By: Truett Perna, M.D.    Medications:  Scheduled Meds:    . sodium chloride   Intravenous Once  . albumin human  25 g Intravenous Once  . alteplase  2 mg Intracatheter Once  . aspirin  81 mg Oral Daily  . darbepoetin  100 mcg Intravenous Q Sat-HD  . darunavir  800 mg Oral Q breakfast  . feeding supplement  237 mL Oral BID BM  . heparin subcutaneous  5,000 Units Subcutaneous Q8H  . lamiVUDine  50 mg Oral Daily  . linezolid  600 mg Intravenous Q12H  . midodrine  10 mg Oral BID WC  . multivitamin  1 tablet Oral QHS  . pantoprazole  20 mg Oral Q1200  . paricalcitol  4 mcg Intravenous Q T,Th,Sa-HD  . ritonavir  100 mg Oral Q breakfast  . sodium chloride  3 mL Intravenous Q12H  . stavudine  15 mg Oral Daily  . vancomycin  125 mg Oral QID   Continuous Infusions:   PRN Meds:.sodium chloride, sodium chloride, acetaminophen, heparin, heparin, heparin, heparin, lidocaine, lidocaine-prilocaine, ondansetron (ZOFRAN) IV, ondansetron,  oxyCODONE-acetaminophen, pentafluoroprop-tetrafluoroeth, DISCONTD: fentaNYL, DISCONTD: fentaNYL, DISCONTD: heparin, DISCONTD: lidocaine, DISCONTD: meperidine (DEMEROL) injection, DISCONTD: midazolam, DISCONTD: midazolam, DISCONTD: oxyCODONE-acetaminophen, DISCONTD: promethazine  Assessment/Plan:   56 y.o PMH HIV, HTN, ESRD on HD TTHSat, h/o bacteriuria with VRE 03/2010, h/o MSSA and coag neg staph bacteremia and potential endocarditis 2/2 left IJ infection 05/2011, h/o C. Diff (07/2011), MGUS, h/o  anemia, h/o pancytopenia with EF 40-45%.  She presented 8/16 with fever and ab pain.    1. Bacteremia -4 blood Cxs aerobic/ anaerobic +VRE and previous cath tip +enterococcus 80K colonies  -old HD cath removed 8/17  -TTE 8/17 shows EF 50-55%, mild LVH, grade 1 diastolic dysfunction, mild AR, trivial pericardial effusion ant. Heart -TEE 8/21 mod hypertrophied LV; EF 35-40%; global hypokinesis and inf/inferiorlateral akinesis no thrombus/mass; mild  thick MV; small calcified mobile mass on chordae tendinae possibly potentially ruptured cord or old organized thrombus; trace mild MR; aortic valve with trace central regurg;   Plan -Pt Rx Zyvoxx 600 mg iv since 8/18; previously received Rocephin 1 g q24 hours iv and Vancomycin iv 500 mg x 3 days this admission -cardiology consulted with recs-see TEE note -ID consulted and following will ask to comment on tx duration post TEE results  -renal and VVS following pt  2. C.diff -still with diarrhea  -isolation precautions and protocol -Rx oral Vancomycin 125 mg qid (since 8/16) will tx for duration of abx therapy for bacteremia (8 weeks) plus 2 weeks after discontinue abx for bacteremia   3. Bradycardia  -pt is asymptomatic, HR ranging 40s-50s, intermittently 60s-70s -will monitor   4. Ischemic and Nonischemic cardiomyopathy -EF 35-40% -nuclear stress 05/2011-showed large fixed inferior defect which is likely scar consistent with TEE today -rec medical  management: avoid BB d/t bradycardia but can try ACEI or ARB  5. History of hypotension -noted on admission and throughout admission -RN in HD noted sbp 80s and dbp 40s in HD today -hold home Lopressor  -Cont Midodrine 10 mg bid  -will monitor bp -careful with IVF boluses 2/2 #4  6. HIV INFECTION  -CD4 and viral ct trended down and up 130; 726,309 respectively from 210 and 4,783 in 07/2011 -repeat cd 4 ct 8/20 was 310; HIV genotype was cancelled but reordered today  -home meds (Norvir, Epivir, Prezista, Zerit) -no PCP px with Bactrim 3x per week per ID Dr. Tommy Medal as of now -ID following   7. ESRD (end stage renal disease) (03/19/2007) -renal consulted and following -Cr. baseline 3-4 range  -HD access chest pulled after dialysis 8/17 d/t bacteremia -new diatek cath placed right fem vein on 8/20 by VVS -HD TTHSat but pt received 1 hr HD on 8/20 and 3 hours HD on 8/21 -pt will resume nl HD schedule 8/22   8. ANEMIA OF CHRONIC DISEASE -Aransep   9. Pancytopenia  -chronic -will monitor cbc   10. DVT/GIPx -Heparin 5K tid -protonix   11. F/E/N -renal following electrolytes -diet ordered   12. 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

## 2011-08-27 NOTE — Progress Notes (Signed)
  Echocardiogram Echocardiogram Transesophageal has been performed.  [name] 08/27/2011, 12:47 PM

## 2011-08-27 NOTE — Progress Notes (Signed)
[name] for Infectious Disease    Date of Admission:  08/21/2011     Total days of antibiotics:6  Day 6 : Oral vancomycin  Day 4: Linezolid   . albumin human  25 g Intravenous Once  . aspirin  81 mg Oral Daily  . cefUROXime (ZINACEF)  IV  1.5 g Intravenous On Call to OR  . darbepoetin  100 mcg Intravenous Q Sat-HD  . darunavir  800 mg Oral Q breakfast  . feeding supplement  237 mL Oral BID BM  . lamiVUDine  50 mg Oral Daily  . linezolid  600 mg Intravenous Q12H  . midodrine  10 mg Oral BID WC  . multivitamin  1 tablet Oral QHS  . pantoprazole  20 mg Oral Q1200  . paricalcitol  4 mcg Intravenous Q T,Th,Sa-HD  . ritonavir  100 mg Oral Q breakfast  . sodium chloride  3 mL Intravenous Q12H  . stavudine  15 mg Oral Daily  . vancomycin  125 mg Oral QID  . DISCONTD: multivitamin  1 tablet Oral Daily  . DISCONTD: stavudine  20 mg Oral Daily   Subjective: S/p TEE this morning . When we saw her she was on HD. No new complains.   Objective: Temp:  [96.8 F (36 C)-98.3 F (36.8 C)] 98 F (36.7 C) (08/21 0900) Pulse Rate:  [42-70] 55  (08/21 0900) Resp:  [14-18] 16  (08/21 0900) BP: (67-143)/(38-85) 105/52 mmHg (08/21 0900) SpO2:  [92 %-98 %] 95 % (08/21 0900) Weight:  [49.2 kg (108 lb 7.5 oz)] 49.2 kg (108 lb 7.5 oz) (08/20 2154) General: resting in bed, NAD  Skin: scar from previous surgery  HEENT: PERRL, EOMI, no scleral icterus, no oral thrush, lymphadenopathy  Cardiac: RRR, 2/6 systolic murmur  Pulm: clear to auscultation bilaterally, moving normal volumes of air  Abd: soft, nontender, nondistended,BS present  Ext: warm and well perfused, no pedal edema  Neuro: alert and oriented X3, cranial nerves II-XII grossly intact  Lab Results Lab Results  Component Value Date   WBC 3.9* 08/27/2011   HGB 10.4* 08/27/2011   HCT 33.7* 08/27/2011   MCV 99.1 08/27/2011   PLT 213 08/27/2011    Lab Results  Component Value Date   CREATININE 5.43* 08/27/2011   BUN 27*  08/27/2011   NA 133* 08/27/2011   K 5.3* 08/27/2011   CL 99 08/27/2011   CO2 28 08/27/2011    Lab Results  Component Value Date   ALT 5 08/21/2011   AST 26 08/21/2011   ALKPHOS 120* 08/21/2011   BILITOT 0.3 08/21/2011    Microbiology: Recent Results (from the past 240 hour(s))  CULTURE, BLOOD (ROUTINE X 2)     Status: Normal   Collection Time   08/21/11 11:35 PM      Component Value Range Status Comment   Specimen Description BLOOD LEFT ARM   Final    Special Requests BOTTLES DRAWN AEROBIC AND ANAEROBIC 5ML   Final    Culture  Setup Time 08/22/2011 04:58   Final    Culture     Final    Value: VANCOMYCIN RESISTANT ENTEROCOCCUS ISOLATED     Note: CRITICAL RESULT CALLED TO, READ BACK BY AND VERIFIED WITH: ASHLEY@ 1025 08/25/11 BY KRAWS     Note: Gram Stain Report Called to,Read Back By and Verified With: [facility] PORTER @ 2150 ON 08/22/11 BY GOLLD   Report Status 08/25/2011 FINAL   Final    Organism ID, Bacteria VANCOMYCIN  RESISTANT ENTEROCOCCUS ISOLATED   Final   CULTURE, BLOOD (ROUTINE X 2)     Status: Normal   Collection Time   08/21/11 11:59 PM      Component Value Range Status Comment   Specimen Description BLOOD LEFT HAND   Final    Special Requests BOTTLES DRAWN AEROBIC AND ANAEROBIC 5CC   Final    Culture  Setup Time 08/22/2011 04:59   Final    Culture     Final    Value: ENTEROCOCCUS SPECIES     Note: SUSCEPTIBILITIES PERFORMED ON PREVIOUS CULTURE WITHIN THE LAST 5 DAYS.     Note: Gram Stain Report Called to,Read Back By and Verified With: KIMBERLY PORTER @ 2150 ON 08/22/11 BY GOLLD   Report Status 08/25/2011 FINAL   Final   MRSA PCR SCREENING     Status: Normal   Collection Time   08/22/11  5:24 AM      Component Value Range Status Comment   MRSA by PCR NEGATIVE  NEGATIVE Final   CLOSTRIDIUM DIFFICILE BY PCR     Status: Abnormal   Collection Time   08/22/11 10:48 AM      Component Value Range Status Comment   C difficile by pcr POSITIVE (*) NEGATIVE Final   CULTURE, BLOOD  (ROUTINE X 2)     Status: Normal   Collection Time   08/22/11 11:15 PM      Component Value Range Status Comment   Specimen Description BLOOD RIGHT ARM   Final    Special Requests BOTTLES DRAWN AEROBIC AND ANAEROBIC 10CC   Final    Culture  Setup Time 08/23/2011 05:11   Final    Culture     Final    Value: ENTEROCOCCUS SPECIES     Note: SUSCEPTIBILITIES PERFORMED ON PREVIOUS CULTURE WITHIN THE LAST 5 DAYS.     Note: Gram Stain Report Called to,Read Back By and Verified With: RN K. GENGLER ON 08/23/11 AT 2145 BY DTERRY   Report Status 08/25/2011 FINAL   Final   CULTURE, BLOOD (ROUTINE X 2)     Status: Normal   Collection Time   08/22/11 11:20 PM      Component Value Range Status Comment   Specimen Description BLOOD RIGHT ARM   Final    Special Requests BOTTLES DRAWN AEROBIC AND ANAEROBIC 10CC   Final    Culture  Setup Time 08/23/2011 05:11   Final    Culture     Final    Value: ENTEROCOCCUS SPECIES     Note: SUSCEPTIBILITIES PERFORMED ON PREVIOUS CULTURE WITHIN THE LAST 5 DAYS.     Note: Gram Stain Report Called to,Read Back By and Verified With: RN K. GENGLER ON 08/23/11 AT 2145 BY DTERRY   Report Status 08/25/2011 FINAL   Final   CATH TIP CULTURE     Status: Normal   Collection Time   08/23/11  2:19 PM      Component Value Range Status Comment   Specimen Description CATH TIP   Final    Special Requests NONE   Final    Culture     Final    Value: 80 COLONIES VANCOMYCIN RESISTANT ENTEROCOCCUS ISOLATED     Note: RVB TINA O 08/27/11@830  BY REAMM   Report Status 08/27/2011 FINAL   Final    Organism ID, Bacteria VANCOMYCIN RESISTANT ENTEROCOCCUS ISOLATED   Final   CULTURE, BLOOD (ROUTINE X 2)     Status: Normal (Preliminary result)   Collection Time  08/26/11  7:55 AM      Component Value Range Status Comment   Specimen Description BLOOD HAND LEFT   Final    Special Requests BOTTLES DRAWN AEROBIC ONLY 10CC   Final    Culture  Setup Time 08/26/2011 15:00   Final    Culture     Final     Value:        BLOOD CULTURE RECEIVED NO GROWTH TO DATE CULTURE WILL BE HELD FOR 5 DAYS BEFORE ISSUING A FINAL NEGATIVE REPORT   Report Status PENDING   Incomplete   CULTURE, BLOOD (ROUTINE X 2)     Status: Normal (Preliminary result)   Collection Time   08/26/11  8:05 AM      Component Value Range Status Comment   Specimen Description BLOOD LEFT FOREARM   Final    Special Requests BOTTLES DRAWN AEROBIC AND ANAEROBIC 10CC EACH   Final    Culture  Setup Time 08/26/2011 15:01   Final    Culture     Final    Value:        BLOOD CULTURE RECEIVED NO GROWTH TO DATE CULTURE WILL BE HELD FOR 5 DAYS BEFORE ISSUING A FINAL NEGATIVE REPORT   Report Status PENDING   Incomplete     Studies/Results: Dg Chest Port 1 View  08/26/2011  *RADIOLOGY REPORT*  Clinical Data: Right femoral Diatek catheter placement  PORTABLE CHEST - 1 VIEW  Comparison: 07/11/2011  Findings: Femoral catheter has been placed with tips in the right atrium and SVC.  Right jugular catheter has been removed since the prior study.  Cardiac enlargement.  Normal vascularity.  No edema or effusion.  IMPRESSION: Femoral catheter tips in the right atrium and SVC.  Lungs are clear.   Original Report Authenticated By: Truett Perna, M.D.    Assessment and Plan:   [name] is a 56 y.o. 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 VRE bacteremia: followup TEE results, continue zyvox  2. C.diff: Positive c.diff PCR and diarrhea . As well as pt has had h/o hospitalization for C.diff on 07/2011 and treated with flagyl for 2 weeks. Concerned for recurrent C. Diff infection.   1. Continue oral vancomycin now and extend for 2 weeks beyond completion of Linezolid therapy 2. Contact isolation   3 HIV/AIDS: H/o HIV and on HAART  medication. PCP pneumonia on 5/13 and treated with bactrim. Increased viral load [id number] and decreased CD4 count to 130 from 210 on recent HIV status test (8/16) . Which indicate noncompliant with medication and pt 's need prophylaxis for PCP. Will defer in setting acutely of CDI. Pt usually seen in the ID clinic by Dr.  Baxter Flattery Dr. Johnnye Sima. For better medication compliance, plan for SNF placement when stable.  1. Pending HIV genotype 2. F/u CD4 in 2 weeks   Akter, Nasrin 08/27/2011, 9:20 AM  INFECTIOUS DISEASE ATTENDING ADDENDUM:     [facility] for Infectious [facility]   Date: 08/27/2011  Patient name: [name]  Medical record number: [id number]  Date of birth: [birth year]    This patient has been seen and discussed with the house staff. Please see their note for complete details. I concur with their findings with the following additions/corrections:  Patient with VRE bacteremia  but not clear endocarditis by TEE  IF she had true VRE endocarditis THEN the relapse rate is on order of 40% and duration would need to be aimed at 8 weeks from day of first negative blood cultures = 08/26/11  . Note the TEE does not in of itself completely exclude endocarditis.   Her  persistently positive cultures with VRE are a major criteria (thought the HD catheter could also explain this persistence), her prior IE and her fever are two minor criteria nearly putting her into catetory of Definite endocarditis by [facility] Clinical criteria.  For now I would continue the zyvox  If we truly try to push for 8 weeks we may run into toxicity issues  She will need weekly cbc at minimum  . If that becomes the case then daptomycin dosed with HD would be an acceptable alternative  With ret to the CDI  I would continue her oral vancomycin at full treatment dose qid for entirett of her zyvox and or daptomycin  therapy PLUS additional two weeks oral vancomycin  Hopefully by placeement in SNF we can assure  better compliancce with ARV. i am holding off reinstuting her TMP/SMX to see if CD4 count may rise above 200  I think that we can see her [facility] in followup and make more decisions re duration of therapy for VRE but for now would operate under assumption of more prolonged treatment esp in light of her having had to have multiple HD catheters removed already.  I will ask my clinic manager to make appt with me the first week in September in [place].  Please make sure that zyvox (or if we have toxicity issues, daptomycin) are continued until I have seen the pt in clinic and re-evaluated her.  I will sign off for now please call with further questions    Alcide Evener 08/27/2011, 5:10 PM

## 2011-08-27 NOTE — Progress Notes (Signed)
Heard patient crying from outside her room. When in to assess and patient stated that her neck was in pain. Assessed patient neck and it seemed to be slightly swollen on both sides from where the two IJs were attempted. Referred to charge nurse for second opinion. Patient stated that she had no difficulty breathing or swallowing. Just that she was in pain and her neck felt tight. MD was paged with new assessment finding. Two MDs came onto the floor to assess [name]. New orders were written. Will continue to monitor [name] periodically.

## 2011-08-27 NOTE — Procedures (Signed)
I was present at this session.  I have reviewed the session itself and made appropriate changes. Cath flow 250-300, bp 90-110  [name] L 8/21/20133:48 PM

## 2011-08-28 ENCOUNTER — Encounter (HOSPITAL_COMMUNITY): Payer: Self-pay

## 2011-08-28 ENCOUNTER — Inpatient Hospital Stay (HOSPITAL_COMMUNITY): Payer: Medicaid Other

## 2011-08-28 ENCOUNTER — Encounter (HOSPITAL_COMMUNITY): Payer: Self-pay | Admitting: Internal Medicine

## 2011-08-28 DIAGNOSIS — D696 Thrombocytopenia, unspecified: Secondary | ICD-10-CM | POA: Diagnosis present

## 2011-08-28 LAB — CBC WITH DIFFERENTIAL/PLATELET
Basophils Absolute: 0 10*3/uL (ref 0.0–0.1)
Basophils Relative: 0 % (ref 0–1)
Eosinophils Absolute: 0 10*3/uL (ref 0.0–0.7)
Eosinophils Relative: 1 % (ref 0–5)
HCT: 31.5 % — ABNORMAL LOW (ref 36.0–46.0)
Hemoglobin: 9.6 g/dL — ABNORMAL LOW (ref 12.0–15.0)
Lymphocytes Relative: 44 % (ref 12–46)
Lymphs Abs: 2 10*3/uL (ref 0.7–4.0)
MCH: 30.9 pg (ref 26.0–34.0)
MCHC: 30.5 g/dL (ref 30.0–36.0)
MCV: 101.3 fL — ABNORMAL HIGH (ref 78.0–100.0)
Monocytes Absolute: 0.6 10*3/uL (ref 0.1–1.0)
Monocytes Relative: 14 % — ABNORMAL HIGH (ref 3–12)
Neutro Abs: 2 10*3/uL (ref 1.7–7.7)
Neutrophils Relative %: 42 % — ABNORMAL LOW (ref 43–77)
Platelets: 138 10*3/uL — ABNORMAL LOW (ref 150–400)
RBC: 3.11 MIL/uL — ABNORMAL LOW (ref 3.87–5.11)
RDW: 16.5 % — ABNORMAL HIGH (ref 11.5–15.5)
WBC: 4.7 10*3/uL (ref 4.0–10.5)

## 2011-08-28 LAB — RENAL FUNCTION PANEL
Albumin: 2.2 g/dL — ABNORMAL LOW (ref 3.5–5.2)
BUN: 17 mg/dL (ref 6–23)
CO2: 28 mEq/L (ref 19–32)
Calcium: 8.9 mg/dL (ref 8.4–10.5)
Chloride: 99 mEq/L (ref 96–112)
Creatinine, Ser: 4.21 mg/dL — ABNORMAL HIGH (ref 0.50–1.10)
GFR calc Af Amer: 13 mL/min — ABNORMAL LOW (ref >90)
GFR calc non Af Amer: 11 mL/min — ABNORMAL LOW (ref >90)
Glucose, Bld: 75 mg/dL (ref 70–99)
Phosphorus: 3.2 mg/dL (ref 2.3–4.6)
Potassium: 4.7 mEq/L (ref 3.5–5.1)
Sodium: 134 mEq/L — ABNORMAL LOW (ref 135–145)

## 2011-08-28 MED ORDER — SODIUM CHLORIDE 0.9 % IV SOLN
6.0000 mg/kg | INTRAVENOUS | Status: DC
  Administered 2011-08-30: 282 mg via INTRAVENOUS
  Filled 2011-08-28 (×3): qty 5.64

## 2011-08-28 MED ORDER — IOHEXOL 300 MG/ML  SOLN
100.0000 mL | Freq: Once | INTRAMUSCULAR | Status: AC | PRN
  Administered 2011-08-28: 40 mL via INTRAVENOUS

## 2011-08-28 MED ORDER — TUBERCULIN PPD 5 UNIT/0.1ML ID SOLN
5.0000 [IU] | Freq: Once | INTRADERMAL | Status: AC
  Administered 2011-08-28: 5 [IU] via INTRADERMAL
  Filled 2011-08-28: qty 0.1

## 2011-08-28 MED ORDER — PARICALCITOL 5 MCG/ML IV SOLN
INTRAVENOUS | Status: AC
  Administered 2011-08-28: 4 ug via INTRAVENOUS
  Filled 2011-08-28: qty 1

## 2011-08-28 MED ORDER — DAPTOMYCIN 500 MG IV SOLR
6.0000 mg/kg | Freq: Once | INTRAVENOUS | Status: DC
  Filled 2011-08-28: qty 5.64

## 2011-08-28 MED ORDER — SODIUM CHLORIDE 0.9 % IV SOLN
6.0000 mg/kg | INTRAVENOUS | Status: DC
  Filled 2011-08-28: qty 5.64

## 2011-08-28 MED ORDER — DAPTOMYCIN 500 MG IV SOLR
6.0000 mg/kg | Freq: Once | INTRAVENOUS | Status: AC
  Administered 2011-08-28: 282 mg via INTRAVENOUS
  Filled 2011-08-28: qty 5.64

## 2011-08-28 NOTE — Procedures (Signed)
Bilateral upper extremity and central venograms completed Full report in pacs No comp Stable

## 2011-08-28 NOTE — Progress Notes (Addendum)
Subjective: Pt reports active diarrhea otherwise feels good   Objective: Vital signs in last 24 hours: Filed Vitals:   08/27/11 2025 08/27/11 2033 08/27/11 2115 08/28/11 0612  BP: 78/48 93/46 90/52  89/40  Pulse: 69 54 61 84  Temp:  97.3 F (36.3 C) 98.2 F (36.8 C) 98.2 F (36.8 C)  TempSrc:  Oral Oral Oral  Resp: 15 16 16 16   Height:      Weight:  104 lb 0.9 oz (47.2 kg) 103 lb 6.3 oz (46.9 kg)   SpO2:  92% 91% 95%   Weight change: -4 lb 6.6 oz (-2 kg)  Intake/Output Summary (Last 24 hours) at 08/28/11 1130 Last data filed at 08/28/11 [id number]  Gross per 24 hour  Intake    580 ml  Output    593 ml  Net    -13 ml   Vitals reviewed-->BP 89/40 General: resting in recliner, NAD, cachetic HEENT: no scleral icterus Cardiac: RRR, no rubs, murmurs or gallops; right chest with scab from prev cath Pulm: clear to auscultation bilaterally, no wheezes, rales, or rhonchi Abd: soft, nontender, nondistended, BS present (norma) Ext: warm and well perfused, no pedal edema; right thigh diatek cath no evidence of infection Skin: hyperpig to b/l lower ext  Neuro: alert and oriented X3  Lab Results: Basic Metabolic Panel:  Lab [id number] 0600 08/27/11 0635 08/22/11 0100  NA 134* 133* --  K 4.7 5.3* --  CL 99 99 --  CO2 28 28 --  GLUCOSE 75 73 --  BUN 17 27* --  CREATININE 4.21* 5.43* --  CALCIUM 8.9 8.8 --  MG -- -- 1.7  PHOS 3.2 3.5 --   Liver Function Tests:  Lab 08/28/11 0600 08/27/11 0635 08/21/11 2342  AST -- -- 26  ALT -- -- 5  ALKPHOS -- -- 120*  BILITOT -- -- 0.3  PROT -- -- 8.9*  ALBUMIN 2.2* 2.3* --    Lab 08/22/11 0100  LIPASE 40  AMYLASE --   CBC:  Lab 08/28/11 0600 08/27/11 0635  WBC 4.7 3.9*  NEUTROABS 2.0 1.6*  HGB 9.6* 10.4*  HCT 31.5* 33.7*  MCV 101.3* 99.1  PLT 138* 213   CBG:  Lab 08/26/11 1137 08/23/11 1423 08/23/11 1341 08/22/11 0503  GLUCAP 85 97 63* 72     Misc. Labs: no recent  Micro Results: Recent Results (from the past 240  hour(s))  CULTURE, BLOOD (ROUTINE X 2)     Status: Normal   Collection Time   08/21/11 11:35 PM      Component Value Range Status Comment   Specimen Description BLOOD LEFT ARM   Final    Special Requests BOTTLES DRAWN AEROBIC AND ANAEROBIC 5ML   Final    Culture  Setup Time 08/22/2011 04:58   Final    Culture     Final    Value: VANCOMYCIN RESISTANT ENTEROCOCCUS ISOLATED     Note: CRITICAL RESULT CALLED TO, READ BACK BY AND VERIFIED WITH: ASHLEY@ 1025 08/25/11 BY KRAWS     Note: Gram Stain Report Called to,Read Back By and Verified With: KIMBERLY PORTER @ 2150 ON 08/22/11 BY GOLLD   Report Status 08/25/2011 FINAL   Final    Organism ID, Bacteria VANCOMYCIN RESISTANT ENTEROCOCCUS ISOLATED   Final   CULTURE, BLOOD (ROUTINE X 2)     Status: Normal   Collection Time   08/21/11 11:59 PM      Component Value Range Status Comment   Specimen Description BLOOD LEFT HAND  Final    Special Requests BOTTLES DRAWN AEROBIC AND ANAEROBIC 5CC   Final    Culture  Setup Time 08/22/2011 04:59   Final    Culture     Final    Value: ENTEROCOCCUS SPECIES     Note: SUSCEPTIBILITIES PERFORMED ON PREVIOUS CULTURE WITHIN THE LAST 5 DAYS.     Note: Gram Stain Report Called to,Read Back By and Verified With: KIMBERLY PORTER @ 2150 ON 08/22/11 BY GOLLD   Report Status 08/25/2011 FINAL   Final   MRSA PCR SCREENING     Status: Normal   Collection Time   08/22/11  5:24 AM      Component Value Range Status Comment   MRSA by PCR NEGATIVE  NEGATIVE Final   CLOSTRIDIUM DIFFICILE BY PCR     Status: Abnormal   Collection Time   08/22/11 10:48 AM      Component Value Range Status Comment   C difficile by pcr POSITIVE (*) NEGATIVE Final   CULTURE, BLOOD (ROUTINE X 2)     Status: Normal   Collection Time   08/22/11 11:15 PM      Component Value Range Status Comment   Specimen Description BLOOD RIGHT ARM   Final    Special Requests BOTTLES DRAWN AEROBIC AND ANAEROBIC 10CC   Final    Culture  Setup Time 08/23/2011 05:11    Final    Culture     Final    Value: ENTEROCOCCUS SPECIES     Note: SUSCEPTIBILITIES PERFORMED ON PREVIOUS CULTURE WITHIN THE LAST 5 DAYS.     Note: Gram Stain Report Called to,Read Back By and Verified With: RN K. GENGLER ON 08/23/11 AT 2145 BY DTERRY   Report Status 08/25/2011 FINAL   Final   CULTURE, BLOOD (ROUTINE X 2)     Status: Normal   Collection Time   08/22/11 11:20 PM      Component Value Range Status Comment   Specimen Description BLOOD RIGHT ARM   Final    Special Requests BOTTLES DRAWN AEROBIC AND ANAEROBIC 10CC   Final    Culture  Setup Time 08/23/2011 05:11   Final    Culture     Final    Value: ENTEROCOCCUS SPECIES     Note: SUSCEPTIBILITIES PERFORMED ON PREVIOUS CULTURE WITHIN THE LAST 5 DAYS.     Note: Gram Stain Report Called to,Read Back By and Verified With: RN K. GENGLER ON 08/23/11 AT 2145 BY DTERRY   Report Status 08/25/2011 FINAL   Final   CATH TIP CULTURE     Status: Normal   Collection Time   08/23/11  2:19 PM      Component Value Range Status Comment   Specimen Description CATH TIP   Final    Special Requests NONE   Final    Culture     Final    Value: 80 COLONIES VANCOMYCIN RESISTANT ENTEROCOCCUS ISOLATED     Note: RVB TINA O 08/27/11@830  BY REAMM   Report Status 08/27/2011 FINAL   Final    Organism ID, Bacteria VANCOMYCIN RESISTANT ENTEROCOCCUS ISOLATED   Final   CULTURE, BLOOD (ROUTINE X 2)     Status: Normal (Preliminary result)   Collection Time   08/26/11  7:55 AM      Component Value Range Status Comment   Specimen Description BLOOD HAND LEFT   Final    Special Requests BOTTLES DRAWN AEROBIC ONLY 10CC   Final    Culture  Setup Time 08/26/2011  15:00   Final    Culture     Final    Value:        BLOOD CULTURE RECEIVED NO GROWTH TO DATE CULTURE WILL BE HELD FOR 5 DAYS BEFORE ISSUING A FINAL NEGATIVE REPORT   Report Status PENDING   Incomplete   CULTURE, BLOOD (ROUTINE X 2)     Status: Normal (Preliminary result)   Collection Time   08/26/11  8:05 AM        Component Value Range Status Comment   Specimen Description BLOOD LEFT FOREARM   Final    Special Requests BOTTLES DRAWN AEROBIC AND ANAEROBIC 10CC EACH   Final    Culture  Setup Time 08/26/2011 15:01   Final    Culture     Final    Value:        BLOOD CULTURE RECEIVED NO GROWTH TO DATE CULTURE WILL BE HELD FOR 5 DAYS BEFORE ISSUING A FINAL NEGATIVE REPORT   Report Status PENDING   Incomplete    Studies/Results: Dg Chest Port 1 View  08/26/2011  *RADIOLOGY REPORT*  Clinical Data: Right femoral Diatek catheter placement  PORTABLE CHEST - 1 VIEW  Comparison: 07/11/2011  Findings: Femoral catheter has been placed with tips in the right atrium and SVC.  Right jugular catheter has been removed since the prior study.  Cardiac enlargement.  Normal vascularity.  No edema or effusion.  IMPRESSION: Femoral catheter tips in the right atrium and SVC.  Lungs are clear.   Original Report Authenticated By: Truett Perna, M.D.    Medications:  Scheduled Meds:    . sodium chloride   Intravenous Once  . alteplase  2 mg Intracatheter Once  . aspirin  81 mg Oral Daily  . darbepoetin  100 mcg Intravenous Q Sat-HD  . darunavir  800 mg Oral Q breakfast  . feeding supplement  237 mL Oral BID BM  . heparin subcutaneous  5,000 Units Subcutaneous Q8H  . lamiVUDine  50 mg Oral Daily  . linezolid  600 mg Intravenous Q12H  . midodrine  10 mg Oral BID WC  . multivitamin  1 tablet Oral QHS  . pantoprazole  20 mg Oral Q1200  . paricalcitol  4 mcg Intravenous Q T,Th,Sa-HD  . ritonavir  100 mg Oral Q breakfast  . sodium chloride  3 mL Intravenous Q12H  . stavudine  15 mg Oral Daily  . vancomycin  125 mg Oral QID   Continuous Infusions:   PRN Meds:.sodium chloride, sodium chloride, acetaminophen, heparin, heparin, heparin, heparin, lidocaine, lidocaine-prilocaine, ondansetron (ZOFRAN) IV, ondansetron, oxyCODONE-acetaminophen, pentafluoroprop-tetrafluoroeth, DISCONTD: fentaNYL, DISCONTD: lidocaine, DISCONTD:  midazolam  Assessment/Plan:   56 y.o PMH HIV, HTN, ESRD on HD TTHSat, h/o bacteriuria with VRE 03/2010, h/o MSSA and coag neg staph bacteremia and potential endocarditis 2/2 left IJ infection 05/2011, h/o C. Diff (07/2011), MGUS, h/o  anemia, h/o pancytopenia/thrombocytopenia with EF 40-45% prior to admission.  She presented 8/16 with fever and ab pain.    1. Bacteremia -4 blood Cxs aerobic/ anaerobic +VRE and previous cath tip +enterococcus 80K colonies  -old HD cath removed 8/17 d/t HD related bacteremia -TTE 8/17 shows EF 50-55%, mild LVH, grade 1 diastolic dysfunction, mild AR, trivial pericardial effusion ant. Heart -TEE 8/21 mod hypertrophied LV; EF 35-40%; global hypokinesis and inf/inferiorlateral akinesis no thrombus/mass; mild thick MV; small calcified mobile mass on chordae tendinae possibly potentially ruptured cord or old organized thrombus; trace mild MR; aortic valve with trace central regurg;   Plan -  pending repeat blood cultures will follow  -previously received Rocephin 1 g q24 hours iv and Vancomycin iv 500 mg x 3 days this admission then pt Rx Zyvoxx 600 mg iv since 8/18 to 8/22; -will switch antibiotic to Daptomycin dosed q48 with HD d/t side effects with Zyvoxx  -cardiology consulted-see notes and TEE (above) -ID consulted and following-Dr. Tommy Medal rec changing Zyvoxx to Daptomycin q48 with HD (due to side effects of Zyvoxx) and he wants to f/u with pt 2 weeks after blood Cx neg-pt may need abx duration of 2 wks to 8 wks but Dr. Tommy Medal will f/u pt outpt 2 weeks after neg blood culture possibly early September  -renal and [facility] following pt -spoke with renal Dapto can be dose in HD but renal center will need to be notified  2. C.diff -still with diarrhea  -isolation precautions and protocol -Rx oral Vancomycin 125 mg qid (since 8/16) will tx for duration of abx therapy for bacteremia plus 2 weeks after discontinue abx for bacteremia   3. Bradycardia  -pt is asymptomatic,  HR 84 this am but still ranging 50s-80s -will monitor   4. Ischemic and Nonischemic cardiomyopathy -EF 35-40% -nuclear stress 05/2011-showed large fixed inferior defect which is likely scar consistent with TEE today -rec medical management: avoid BB d/t bradycardia but can try ACEI or ARB -will try ACEI or ARB when bp can tolerate currently 89/40  5. History of hypotension -noted on admission and throughout admission -BP 89/40 today with 24 hour range 80s-150s/50s-70s -cont hold home Lopressor  -Cont Midodrine 10 mg bid  -will monitor bp -careful with IVF boluses 2/2 #4  6. HIV INFECTION  -repeat cd 4 ct 8/20 was 310; HIV genotype pending -home meds (Norvir, Epivir, Prezista, Zerit) -no PCP px with Bactrim 3x per week per ID Dr. Tommy Medal as of now -ID following   7. ESRD (end stage renal disease) (03/19/2007) -renal consulted and following -Cr. baseline 3-4 range  -HD access chest pulled after dialysis 8/17 d/t bacteremia -new diatek cath placed right fem vein on 8/20 by VVS -HD TTHSat  -pt will resume nl HD schedule 8/22 -venography today to look for other sites for access    8. ANEMIA OF CHRONIC DISEASE -Aransep   9. Pancytopenia/Thrombocytopenia  -chronic pancytopenia, thrombocytopenia noted on plts 146 on 8/15 -pt has a h/o thrombocytopenia as well -plts trended down from 213-->138 this am  -will monitor cbc   10. DVT/GIPx -Heparin 5K tid d/c'ed ok to resume scds per renal -protonix   11. F/E/N -renal following electrolytes -diet ordered   12. Dispo -d/c when w/u complete no date as of yet  -social work working on [employer] in [employer] if SNF will be able to get pt to and from HD and ID appts- will ask their input   Code status: full   LOS: 7 days   Cresenciano Genre [id number] 08/28/2011, 11:30 AM

## 2011-08-28 NOTE — Progress Notes (Signed)
ANTIBIOTIC CONSULT NOTE - INITIAL  Pharmacy Consult for Daptomycin Indication: Vancomycin-Resistant Enterococcus Bacteremia  No Known Allergies  Patient Measurements: Height: 5\' 3"  (160 cm) Weight: 103 lb 6.3 oz (46.9 kg) IBW/kg (Calculated) : 52.4   Vital Signs: Temp: 98.2 F (36.8 C) (08/22 0612) Temp src: Oral (08/22 0612) BP: 89/40 mmHg (08/22 0612) Pulse Rate: 84  (08/22 0612) Intake/Output from previous day: 08/21 0701 - 08/22 0700 In: 580 [P.O.:480; I.V.:100] Out: 593 [Stool:3] Intake/Output from this shift:    Labs:  Basename 08/28/11 0600 08/27/11 0635 08/26/11 0755  WBC 4.7 3.9* 3.6*  HGB 9.6* 10.4* 11.5*  PLT 138* 213 243  LABCREA -- -- --  CREATININE 4.21* 5.43* 5.65*   Estimated Creatinine Clearance: 11.3 ml/min (by C-G formula based on Cr of 4.21). No results found for this basename: VANCOTROUGH:2,VANCOPEAK:2,VANCORANDOM:2,GENTTROUGH:2,GENTPEAK:2,GENTRANDOM:2,TOBRATROUGH:2,TOBRAPEAK:2,TOBRARND:2,AMIKACINPEAK:2,AMIKACINTROU:2,AMIKACIN:2, in the last 72 hours   Microbiology: Recent Results (from the past 720 hour(s))  CULTURE, BLOOD (ROUTINE X 2)     Status: Normal   Collection Time   08/21/11 11:35 PM      Component Value Range Status Comment   Specimen Description BLOOD LEFT ARM   Final    Special Requests BOTTLES DRAWN AEROBIC AND ANAEROBIC 5ML   Final    Culture  Setup Time 08/22/2011 04:58   Final    Culture     Final    Value: VANCOMYCIN RESISTANT ENTEROCOCCUS ISOLATED     Note: CRITICAL RESULT CALLED TO, READ BACK BY AND VERIFIED WITH: ASHLEY@ 1025 08/25/11 BY KRAWS     Note: Gram Stain Report Called to,Read Back By and Verified With: KIMBERLY PORTER @ 2150 ON 08/22/11 BY GOLLD   Report Status 08/25/2011 FINAL   Final    Organism ID, Bacteria VANCOMYCIN RESISTANT ENTEROCOCCUS ISOLATED   Final   CULTURE, BLOOD (ROUTINE X 2)     Status: Normal   Collection Time   08/21/11 11:59 PM      Component Value Range Status Comment   Specimen Description  BLOOD LEFT HAND   Final    Special Requests BOTTLES DRAWN AEROBIC AND ANAEROBIC 5CC   Final    Culture  Setup Time 08/22/2011 04:59   Final    Culture     Final    Value: ENTEROCOCCUS SPECIES     Note: SUSCEPTIBILITIES PERFORMED ON PREVIOUS CULTURE WITHIN THE LAST 5 DAYS.     Note: Gram Stain Report Called to,Read Back By and Verified With: KIMBERLY PORTER @ 2150 ON 08/22/11 BY GOLLD   Report Status 08/25/2011 FINAL   Final   MRSA PCR SCREENING     Status: Normal   Collection Time   08/22/11  5:24 AM      Component Value Range Status Comment   MRSA by PCR NEGATIVE  NEGATIVE Final   CLOSTRIDIUM DIFFICILE BY PCR     Status: Abnormal   Collection Time   08/22/11 10:48 AM      Component Value Range Status Comment   C difficile by pcr POSITIVE (*) NEGATIVE Final   CULTURE, BLOOD (ROUTINE X 2)     Status: Normal   Collection Time   08/22/11 11:15 PM      Component Value Range Status Comment   Specimen Description BLOOD RIGHT ARM   Final    Special Requests BOTTLES DRAWN AEROBIC AND ANAEROBIC 10CC   Final    Culture  Setup Time 08/23/2011 05:11   Final    Culture     Final    Value:  ENTEROCOCCUS SPECIES     Note: SUSCEPTIBILITIES PERFORMED ON PREVIOUS CULTURE WITHIN THE LAST 5 DAYS.     Note: Gram Stain Report Called to,Read Back By and Verified With: RN K. GENGLER ON 08/23/11 AT 2145 BY DTERRY   Report Status 08/25/2011 FINAL   Final   CULTURE, BLOOD (ROUTINE X 2)     Status: Normal   Collection Time   08/22/11 11:20 PM      Component Value Range Status Comment   Specimen Description BLOOD RIGHT ARM   Final    Special Requests BOTTLES DRAWN AEROBIC AND ANAEROBIC 10CC   Final    Culture  Setup Time 08/23/2011 05:11   Final    Culture     Final    Value: ENTEROCOCCUS SPECIES     Note: SUSCEPTIBILITIES PERFORMED ON PREVIOUS CULTURE WITHIN THE LAST 5 DAYS.     Note: Gram Stain Report Called to,Read Back By and Verified With: RN K. GENGLER ON 08/23/11 AT 2145 BY DTERRY   Report Status  08/25/2011 FINAL   Final   CATH TIP CULTURE     Status: Normal   Collection Time   08/23/11  2:19 PM      Component Value Range Status Comment   Specimen Description CATH TIP   Final    Special Requests NONE   Final    Culture     Final    Value: 80 COLONIES VANCOMYCIN RESISTANT ENTEROCOCCUS ISOLATED     Note: RVB TINA O 08/27/11@830  BY REAMM   Report Status 08/27/2011 FINAL   Final    Organism ID, Bacteria VANCOMYCIN RESISTANT ENTEROCOCCUS ISOLATED   Final   CULTURE, BLOOD (ROUTINE X 2)     Status: Normal (Preliminary result)   Collection Time   08/26/11  7:55 AM      Component Value Range Status Comment   Specimen Description BLOOD HAND LEFT   Final    Special Requests BOTTLES DRAWN AEROBIC ONLY 10CC   Final    Culture  Setup Time 08/26/2011 15:00   Final    Culture     Final    Value:        BLOOD CULTURE RECEIVED NO GROWTH TO DATE CULTURE WILL BE HELD FOR 5 DAYS BEFORE ISSUING A FINAL NEGATIVE REPORT   Report Status PENDING   Incomplete   CULTURE, BLOOD (ROUTINE X 2)     Status: Normal (Preliminary result)   Collection Time   08/26/11  8:05 AM      Component Value Range Status Comment   Specimen Description BLOOD LEFT FOREARM   Final    Special Requests BOTTLES DRAWN AEROBIC AND ANAEROBIC 10CC EACH   Final    Culture  Setup Time 08/26/2011 15:01   Final    Culture     Final    Value:        BLOOD CULTURE RECEIVED NO GROWTH TO DATE CULTURE WILL BE HELD FOR 5 DAYS BEFORE ISSUING A FINAL NEGATIVE REPORT   Report Status PENDING   Incomplete     Medical History: Past Medical History  Diagnosis Date  . Human immunodeficiency virus (HIV) disease   . Hypertension   . ESRD (end stage renal disease)   . Dialysis patient     pt on dialysis since 2010.  [name] Clostridium difficile infection 04/04/10    07/2011 and 08/2011  . Bacteriuria, asymptomatic 04/04/10    Culture grew VRE sensitive to linesolid   . MGUS (monoclonal gammopathy of unknown  significance)   . History of bacteremia      MSSA  . Renal insufficiency   . Anemia   . Bacteremia     05/2011 and 08/2011    Assessment: 56 y.o. F with ESRD and extensive ID PMH that includes HIV/AIDs, bacterial endocarditis, PCP, MSSA bacteremia. She was admitted on 8/16 with abdominal pain, fever, and neutropenia. At that time she was found to bacteremia with enterococcus that was found to be vancomycin resistant.  Vancomycin was discontinued on 8/18 and the patient was switched to linezolid for treatment of VRE bacteremia.  Since starting linezolid, the patient has had drops in Hgb and plts thought to be related to the drug. Pharmacy has now been consulted to switch this patient over to Daptomycin for continued treatment of the VRE bacteremia.  The patient has ESRD and receives HD sessions normally on T/Th/Sat. Her last HD session was on Wed, 8/21 however orders are entered for the patient to receive HD again today to get back on her home schedule. Will start Daptomycin today after hemodialysis and will plan to dose 3x/wk after HD sessions.   Goal of Therapy:  Eradication of Infection  Plan:  1. Daptomycin 282 mg (~6 mg/kg) IV x 1 dose today after the patient receives HD (they are scheduled for HD today to get back on home schedule) 2. Daptomycin 282 mg (~6 mg/kg) IV post HD sessions on T/Th/Sat 3. Will get baseline CPK with a.m labs on 8/22 and monitor weekly CPKs while on Daptomycin 4. Will continue to follow HD schedule/duration, additional culture results, and LOT  Alycia Rossetti, PharmD, BCPS Clinical Pharmacist Pager: [phone] 08/28/2011 12:45 PM

## 2011-08-28 NOTE — Progress Notes (Signed)
Subjective:   No complaints, no dyspnea, generally poor appetite  Objective: Vital signs in last 24 hours: Temp:  [97.3 F (36.3 C)-98.6 F (37 C)] 98.2 F (36.8 C) (08/22 0612) Pulse Rate:  [48-84] 84  (08/22 0612) Resp:  [7-64] 16  (08/22 0612) BP: (73-166)/(35-80) 89/40 mmHg (08/22 0612) SpO2:  [91 %-100 %] 95 % (08/22 0612) Weight:  [46.9 kg (103 lb 6.3 oz)-47.2 kg (104 lb 0.9 oz)] 46.9 kg (103 lb 6.3 oz) (08/21 2115) Weight change: -2 kg (-4 lb 6.6 oz)  Intake/Output from previous day: 08/21 0701 - 08/22 0700 In: 580 [P.O.:480; I.V.:100] Out: 593 [Stool:3]   EXAM: General appearance:  Alert, comfortable, in no apparent distress Resp:  CTA without rales, rhonchi, or wheezes Cardio:  RRR with Gr II/VI systolic murmur GI: + BS, soft and nontender, liver down 4 cm Extremities:  No edema Access: Right femoral catheter  Lab Results:  Basename 08/28/11 0600 08/27/11 0635  WBC 4.7 3.9*  HGB 9.6* 10.4*  HCT 31.5* 33.7*  PLT 138* 213   BMET:  Basename 08/28/11 0600 08/27/11 0635  NA 134* 133*  K 4.7 5.3*  CL 99 99  CO2 28 28  GLUCOSE 75 73  BUN 17 27*  CREATININE 4.21* 5.43*  CALCIUM 8.9 8.8  ALBUMIN 2.2* 2.3*   No results found for this basename: PTH:2 in the last 72 hours Iron Studies: No results found for this basename: IRON,TIBC,TRANSFERRIN,FERRITIN in the last 72 hours  Assessment/Plan: 1.   Sepsis - Blood cultures + VRE; hx of endocarditis and MSSA bacteremia in May; PC removed 8/17, tip with enterococci, on IV Zyvox and PO Vancomycin; new catheter placement per VVS yesterday. ? Plans for outpatient regimen, ? Dapto  2.   ESRD - HD on TTS at [facility]; K 4.7 s/p HD yesterday. Upper extremity venogram this AM to evaluate for new access, HD pending today to maintain regular schedule.  3.   Hypotension/volume - BP most recently 89/40, takes metoprolol 12.5 qhs presumably for cardiomyopathy, and takes midodrine 10 mg bid at home; Metoprolol is on hold, Midodrine resumed;  post-HD wt 46.9 kg yesterday s/p net UF 590.  4.   CM - EF 40-45%  5.   HIV - continue current antiretroviral tx, ANC 1.5. Per IR.  6.   End-stage access - no further perm access, new R femoral catheter placement on 8/20, venogram today to evaluate for new access.  7.   Anemia of CKD - cont Aranesp 100 mcg on Sat, off IV iron. Hgb down to 9.6.  8.   Metabolic bone disease - cont Zemplar, off binders. P 3.2, last PTH 225, at goal.  9.   Malnutrition - increased to high protein renal diet. Change supplement to Ensure Plus (her preference). It is higher in K, than nepro, so need to watch  10. C. Diff - resolving on po Vanc.    LOS: 7 days   [name],[name] 08/28/2011,9:08 AM   I have seen and examined this patient and agree with the plan of care seen and eval, awaiting dispo .  Renaldo Gornick L 08/28/2011, 9:51 AM

## 2011-08-28 NOTE — Progress Notes (Signed)
Subjective: Not very interactive, but states she is comfortable. No orthopnea or dyspnea with light exertion. Appetite remains poor. TEE showed a minor chronic mitral valve echodensity, that is not felt to be an active vegetation.  Objective: Vital signs in last 24 hours: Temp:  [97.3 F (36.3 C)-98.6 F (37 C)] 98.2 F (36.8 C) (08/22 0612) Pulse Rate:  [48-84] 84  (08/22 0612) Resp:  [7-64] 16  (08/22 0612) BP: (73-166)/(35-80) 89/40 mmHg (08/22 0612) SpO2:  [91 %-100 %] 95 % (08/22 0612) Weight:  [46.9 kg (103 lb 6.3 oz)-47.2 kg (104 lb 0.9 oz)] 46.9 kg (103 lb 6.3 oz) (08/21 2115) Weight change: -2 kg (-4 lb 6.6 oz) Last BM Date: 08/27/11 Intake/Output from previous day: 08/21 0701 - 08/22 0700 In: 580 [P.O.:480; I.V.:100] Out: 593 [Stool:3] Intake/Output this shift:    PE: General: cachectic, no distress Heart:HR now in 70s, RRR no murmurs Lungs: clear Abd:BS are normal, NT/ND Ext:no peripheral stigmata to suggest SBE, multiple scars Neuro:no gross focal abnormalities.   Lab Results:  Basename 08/28/11 0600 08/27/11 0635  WBC 4.7 3.9*  HGB 9.6* 10.4*  HCT 31.5* 33.7*  PLT 138* 213   BMET  Basename 08/28/11 0600 08/27/11 0635  NA 134* 133*  K 4.7 5.3*  CL 99 99  CO2 28 28  GLUCOSE 75 73  BUN 17 27*  CREATININE 4.21* 5.43*  CALCIUM 8.9 8.8   No results found for this basename: TROPONINI:2,CK,MB:2 in the last 72 hours  Lab Results  Component Value Date   CHOL 147 05/30/2011   HDL 12* 05/30/2011   LDLCALC 87 05/30/2011   TRIG 238* 05/30/2011   CHOLHDL 12.3 05/30/2011   Lab Results  Component Value Date   HGBA1C  Value: 5.1 (NOTE)                                                                       According to the ADA Clinical Practice Recommendations for 2011, when HbA1c is used as a screening test:   >=6.5%   Diagnostic of Diabetes Mellitus           (if abnormal result  is confirmed)  5.7-6.4%   Increased risk of developing Diabetes Mellitus   References:Diagnosis and Classification of Diabetes Mellitus,Diabetes [id number] 1):S62-S69 and Standards of Medical Care in         Diabetes - 2011,Diabetes [id number]  (Suppl 1):S11-S61. 04/18/2009       Hepatic Function Panel  Basename 08/28/11 0600  PROT --  ALBUMIN 2.2*  AST --  ALT --  ALKPHOS --  BILITOT --  BILIDIR --  IBILI --   No results found for this basename: CHOL in the last 72 hours No results found for this basename: PROTIME in the last 72 hours    EKG: Orders placed during the hospital encounter of 07/11/11  . EKG    Studies/Results: Dg Chest Port 1 View  08/26/2011  *RADIOLOGY REPORT*  Clinical Data: Right femoral Diatek catheter placement  PORTABLE CHEST - 1 VIEW  Comparison: 07/11/2011  Findings: Femoral catheter has been placed with tips in the right atrium and SVC.  Right jugular catheter has been removed since the prior study.  Cardiac enlargement.  Normal vascularity.  No edema  or effusion.  IMPRESSION: Femoral catheter tips in the right atrium and SVC.  Lungs are clear.   Original Report Authenticated By: Truett Perna, M.D.    TEE:  08/27/11 FINDINGS:  1. LEFT VENTRICLE: The left ventricle is moderately hypertrophied with reduced LV systolic function to 123456. Global hypokinesis with inferior and inferolateral akinesis. No thrombus or masses. 2. RIGHT VENTRICLE: The right ventricle is normal in structure and function without any thrombus or masses.  3. LEFT ATRIUM: The left atrium is normal without any thrombus or masses. 4. LEFT ATRIAL APPENDAGE: The left atrial appendage is large and unilobular is free of any thrombus or masses. Color doppler shows normal flow and pulse doppler was normal. 5. ATRIAL SEPTUM: The atrial septum is free of any thrombus or masses. There is no evidence for interatrial shunting by color doppler. 6. RIGHT ATRIUM: The right atrium is normal in size and function without any thrombus or masses. 7. MITRAL VALVE: The  mitral valve is mildly thickened but no obvious mass or vegetation was noted. There is again, apparently a small, calcified mobile mass noted on the chordae tendinae, which may be a partially ruptured cord or old organized thrombus. There is trace to mild regurgitation. . 8. AORTIC VALVE: The aortic is normal in structure and function with trace central regurgitation. There were no vegetations or stenosis 9. TRICUSPID VALVE: The tricuspid valve was not well visualized. There was trace to mild regurgitation. 10. PULMONIC VALVE: The pulmonic valve is normal in structure and function  without regurgitation. There were no vegetations or stenosis  11. AORTIC ARCH, ASCENDING AND DESCENDING AORTA: There was no  atherosclerosis of the descending or ascending aorta. The aortic root was not dilated.  IMPRESSION:  1. No obvious valvular vegetation. 2. Previously visualized calcified, small mobile mass on the chordae tendinae is not likely a vegetation or source of bacteremia. 3. Moderate to severely reduced LV systolic function (EF 123456), global hypokinesis with inferior and inferolateral akinesis (the reported surface echo conclusion of LVEF is innacurate).   Medications: I have reviewed pt's medications.    . sodium chloride   Intravenous Once  . alteplase  2 mg Intracatheter Once  . aspirin  81 mg Oral Daily  . darbepoetin  100 mcg Intravenous Q Sat-HD  . darunavir  800 mg Oral Q breakfast  . feeding supplement  237 mL Oral BID BM  . heparin subcutaneous  5,000 Units Subcutaneous Q8H  . lamiVUDine  50 mg Oral Daily  . linezolid  600 mg Intravenous Q12H  . midodrine  10 mg Oral BID WC  . multivitamin  1 tablet Oral QHS  . pantoprazole  20 mg Oral Q1200  . paricalcitol  4 mcg Intravenous Q T,Th,Sa-HD  . ritonavir  100 mg Oral Q breakfast  . sodium chloride  3 mL Intravenous Q12H  . stavudine  15 mg Oral Daily  . vancomycin  125 mg Oral QID    Assessment/Plan: Principal Problem:   *Bacteremia Active Problems:  HIV INFECTION  ANEMIA OF CHRONIC DISEASE  Pancytopenia  ESRD (end stage renal disease)  Protein calorie malnutrition  Endocarditis by TTE 05/21/11  C. difficile colitis  Cardiomyopathy (mixed ischemic and nonischemic), EF 35-40% on TEE 08/27/11  Abnormal EKG, chronic AS TWI  NSVT (nonsustained ventricular tachycardia)  Hypotension  Bradycardia  Systolic dysfunction   PLAN: Dr. Lysbeth Penner recommendations:  Antibiotic therapy for dialysis-related line infection  Mixed ischemic and non-ischemic cardiomyopathy. Nuclear stress test in 05/2011 showed a large  fixed inferior defect which is likely scar. This is consistent with echo findings today.   Without active chest pain or reversible changes on her nuclear stress test, I would manage her heart failure medically. HR will not tolerate b-blocker. She may be able to tolerate an ACE-I or ARB, since she is already on dialysis. She did have NSVT in the past, which is possibly scar related.  HR 48  to 80's previously has been in the upper 30's on low dose BB.   LOS: 7 days   INGOLD,LAURA R 08/28/2011, 9:31 AM  I have seen and examined the patient along with Cecilie Kicks NP.  I have reviewed the chart, notes and new data.  I agree with NP's note.  Appears to be euvolemic and hemodynamically compensated today. Unfortunately she is poorly tolerant of beta blockers and her BP is also low, limiting potential for ACEi/ARB therapy, especially on postdialysis days. No changes are recommended at this time. Will review for options to start vasodilator therapy if BP improves after infection is treated.  Sanda Klein, MD, [employer] [phone] 08/28/2011, 12:50 PM

## 2011-08-28 NOTE — Progress Notes (Signed)
Physical Therapy Treatment Patient Details Name: [name] MRN: [id number] DOB: January 02, 1956 Today's Date: 08/28/2011 Time: [id number] PT Time Calculation (min): 30 min  PT Assessment / Plan / Recommendation Comments on Treatment Session  Pt. was asymptomatic today (denies dizziness).  Slight drop in BP with activity however.    Follow Up Recommendations  Skilled nursing facility    Barriers to Discharge        Equipment Recommendations  Defer to next venue    Recommendations for Other Services    Frequency Min 3X/week   Plan Discharge plan remains appropriate    Precautions / Restrictions Precautions Precautions: Fall Precaution Comments: asymptomatic throughout session dsepite low BP of 97/55 which decreased to 90/49 while up in room Restrictions Weight Bearing Restrictions: No   Pertinent Vitals/Pain Denies pain, resting BP 97/55 with HR 70; after walk BP 90/49 with HR 75.  Sats stable throughtout    Mobility  Bed Mobility Bed Mobility: Supine to Sit;Sitting - Scoot to Edge of Bed Supine to Sit: 5: Supervision Sitting - Scoot to Edge of Bed: 5: Supervision Details for Bed Mobility Assistance: cues for hand placement and safety Transfers Transfers: Sit to Stand;Stand to Sit Sit to Stand: 4: Min guard;From bed;From chair/3-in-1 Stand to Sit: 4: Min guard;To chair/3-in-1 Details for Transfer Assistance: again able to stand up to Rw without physical assist, needed cues for hand placement in sitting Ambulation/Gait Ambulation/Gait Assistance: 4: Min guard Ambulation Distance (Feet): 20 Feet Assistive device: Rolling walker Ambulation/Gait Assistance Details: assist to help guide RW into and out of bathroom, but no physical contact needed Gait Pattern: Step-through pattern Gait velocity: slowed    Exercises     PT Diagnosis:    PT Problem List:   PT Treatment Interventions:     PT Goals Acute Rehab PT Goals PT Goal: Supine/Side to Sit - Progress: Progressing  toward goal PT Goal: Sit to Stand - Progress: Progressing toward goal PT Goal: Stand to Sit - Progress: Progressing toward goal PT Goal: Ambulate - Progress: Progressing toward goal  Visit Information  Last PT Received On: 08/28/11 Assistance Needed: +1    Subjective Data  Subjective: Asked to use the baathroom   Cognition  Overall Cognitive Status: Appears within functional limits for tasks assessed/performed Arousal/Alertness: Awake/alert Orientation Level: Appears intact for tasks assessed Behavior During Session: [facility] for tasks performed    Balance     End of Session PT - End of Session Activity Tolerance: Patient tolerated treatment well;Patient limited by fatigue Patient left: in chair;with call bell/phone within reach Nurse Communication: Mobility status   GP     Ladona Ridgel 08/28/2011, 8:57 AM 08/28/2011  Gerlean Ren PT Acute Rehab Services [place] [phone]

## 2011-08-28 NOTE — Progress Notes (Signed)
Clinical Social Work-CSW contacted [facility]-SNF- and discussed pt Dialysis-pt will need to to be transferred to [facility]-CSW contacted dialysis center and has begun admissions process- CSW following and will facilitate d/c to SNF when pt is accepted to center and medically stable for d/c- Gerre Scull, [phone]

## 2011-08-29 LAB — CBC WITH DIFFERENTIAL/PLATELET
Basophils Absolute: 0 10*3/uL (ref 0.0–0.1)
Basophils Relative: 1 % (ref 0–1)
Eosinophils Absolute: 0.1 10*3/uL (ref 0.0–0.7)
Eosinophils Relative: 3 % (ref 0–5)
HCT: 33 % — ABNORMAL LOW (ref 36.0–46.0)
Hemoglobin: 10.1 g/dL — ABNORMAL LOW (ref 12.0–15.0)
Lymphocytes Relative: 22 % (ref 12–46)
Lymphs Abs: 0.8 10*3/uL (ref 0.7–4.0)
MCH: 31.1 pg (ref 26.0–34.0)
MCHC: 30.6 g/dL (ref 30.0–36.0)
MCV: 101.5 fL — ABNORMAL HIGH (ref 78.0–100.0)
Monocytes Absolute: 0.3 10*3/uL (ref 0.1–1.0)
Monocytes Relative: 8 % (ref 3–12)
Neutro Abs: 2.3 10*3/uL (ref 1.7–7.7)
Neutrophils Relative %: 66 % (ref 43–77)
Platelets: 124 10*3/uL — ABNORMAL LOW (ref 150–400)
RBC: 3.25 MIL/uL — ABNORMAL LOW (ref 3.87–5.11)
RDW: 16.4 % — ABNORMAL HIGH (ref 11.5–15.5)
WBC: 3.5 10*3/uL — ABNORMAL LOW (ref 4.0–10.5)

## 2011-08-29 LAB — RENAL FUNCTION PANEL
Albumin: 2.3 g/dL — ABNORMAL LOW (ref 3.5–5.2)
BUN: 8 mg/dL (ref 6–23)
CO2: 26 mEq/L (ref 19–32)
Calcium: 8.9 mg/dL (ref 8.4–10.5)
Chloride: 100 mEq/L (ref 96–112)
Creatinine, Ser: 3.03 mg/dL — ABNORMAL HIGH (ref 0.50–1.10)
GFR calc Af Amer: 19 mL/min — ABNORMAL LOW (ref >90)
GFR calc non Af Amer: 16 mL/min — ABNORMAL LOW (ref >90)
Glucose, Bld: 79 mg/dL (ref 70–99)
Phosphorus: 2.6 mg/dL (ref 2.3–4.6)
Potassium: 4.3 mEq/L (ref 3.5–5.1)
Sodium: 135 mEq/L (ref 135–145)

## 2011-08-29 LAB — CK: Total CK: 63 U/L (ref 7–177)

## 2011-08-29 NOTE — Progress Notes (Signed)
Physical Therapy Treatment Patient Details Name: [name] MRN: [id number] DOB: [birth year] Today's Date: 08/29/2011 Time: [id number] PT Time Calculation (min): 28 min  PT Assessment / Plan / Recommendation Comments on Treatment Session  Pt. continues to be asymptomatic when up with PT.  HR initially low but up to 80 during walk.  Progressing her mobility.    Follow Up Recommendations  Skilled nursing facility    Barriers to Discharge        Equipment Recommendations  Defer to next venue    Recommendations for Other Services    Frequency Min 3X/week   Plan Discharge plan remains appropriate    Precautions / Restrictions Precautions Precautions: Fall Restrictions Weight Bearing Restrictions: No   Pertinent Vitals/Pain No pain, HR 57-80; sats stable 93-95% and no dyspnea    Mobility  Bed Mobility Bed Mobility: Not assessed Supine to Sit: Not tested (comment) Transfers Transfers: Sit to Stand;Stand to Sit Sit to Stand: 5: Supervision;With upper extremity assist;From chair/3-in-1 Stand to Sit: 5: Supervision;With armrests;To chair/3-in-1 Details for Transfer Assistance: vc's as reminder for hand placement, otherwise good technique Ambulation/Gait Ambulation/Gait Assistance: 4: Min guard Ambulation Distance (Feet): 60 Feet Assistive device: Rolling walker Ambulation/Gait Assistance Details: Pt. able negotiate with use of RW and reminder to keep walker on floor, otherwise she maneuvered well and increased her distance, asymptomatic Gait Pattern: Step-through pattern Gait velocity: slowed    Exercises General Exercises - Lower Extremity Ankle Circles/Pumps: 20 reps;AROM;Both;Seated Long Arc Quad: AROM;Both;15 reps;Seated Hip Flexion/Marching: AROM;Both;15 reps;Seated   PT Diagnosis:    PT Problem List:   PT Treatment Interventions:     PT Goals Acute Rehab PT Goals PT Goal: Sit to Stand - Progress: Progressing toward goal PT Goal: Stand to Sit - Progress:  Progressing toward goal PT Goal: Ambulate - Progress: Progressing toward goal PT Goal: Perform Home Exercise Program - Progress: Progressing toward goal  Visit Information  Last PT Received On: 08/29/11 Assistance Needed: +1    Subjective Data  Subjective: "I've been wanting to walk in the hallway"   Cognition  Overall Cognitive Status: Appears within functional limits for tasks assessed/performed Arousal/Alertness: Awake/alert Orientation Level: Appears intact for tasks assessed Behavior During Session: [facility] for tasks performed    Balance     End of Session PT - End of Session Equipment Utilized During Treatment: Gait belt Activity Tolerance: Patient tolerated treatment well Patient left: in chair;with call bell/phone within reach Nurse Communication: Mobility status   GP     [name] 08/29/2011, 10:18 AM [name] PT Acute Rehab Services [phone] [place] [phone]

## 2011-08-29 NOTE — Progress Notes (Signed)
Clinical Social Work-CSW received call back from Dialysis Coordinator-Pt will not be accepted before Monday-CSW will notify treatment team and proceed with facilitating d/c as soon as dialysis arranged- Ashok Cordia, [phone]

## 2011-08-29 NOTE — Progress Notes (Signed)
Subjective: Interval History: none.  Objective: Vital signs in last 24 hours: Temp:  [97.8 F (36.6 C)-98.5 F (36.9 C)] 97.8 F (36.6 C) (08/23 0912) Pulse Rate:  [43-75] 55  (08/23 0912) Resp:  [15-20] 18  (08/23 0912) BP: (69-110)/(36-66) 107/66 mmHg (08/23 0912) SpO2:  [90 %-97 %] 92 % (08/23 0912) Weight:  [46.9 kg (103 lb 6.3 oz)-55.6 kg (122 lb 9.2 oz)] 46.9 kg (103 lb 6.3 oz) Sep 01, 2022 2051) Weight change: 8.4 kg (18 lb 8.3 oz)  Intake/Output from previous day: 09/01/22 0701 - 08/23 0700 In: 0  Out: -140 [Urine:1; Stool:1] Intake/Output this shift: Total I/O In: 240 [P.O.:240] Out: -   General appearance: alert and cooperative Resp: diminished breath sounds bilaterally Cardio: S1, S2 normal and systolic murmur: holosystolic 2/6, blowing at apex GI: liver down 3 cm Extremities: Fem cath  Lab Results:  Basename 08/29/11 0607 09/01/2011 0600  WBC 3.5* 4.7  HGB 10.1* 9.6*  HCT 33.0* 31.5*  PLT 124* 138*   BMET:  Basename 08/29/11 0607 09/01/2011 0600  NA 135 134*  K 4.3 4.7  CL 100 99  CO2 26 28  GLUCOSE 79 75  BUN 8 17  CREATININE 3.03* 4.21*  CALCIUM 8.9 8.9   No results found for this basename: PTH:2 in the last 72 hours Iron Studies: No results found for this basename: IRON,TIBC,TRANSFERRIN,FERRITIN in the last 72 hours  Studies/Results: Ir Veno/ext/bi  2011/09/01  *RADIOLOGY REPORT*  Clinical Data: End-stage renal disease, venous access planning for new access, current access is a femoral tunneled catheter  [place] VENOGRAMS  Date:  09-01-11 09:50:00  Radiologist:  M. Daryll Brod, M.D.  Medications:  1% lidocaine locally  Guidance:  Ultrasound fluoroscopic  Fluoroscopy time:  0.8 minutes  Sedation time:  None.  Contrast volume:  40 ml [id number]  Complications:  No immediate  PROCEDURE/FINDINGS:  Informed consent was obtained from the patient following explanation of the procedure, risks,  benefits and alternatives. The patient understands, agrees and consents for the procedure. All questions were addressed.  A time out was performed.  Maximal barrier sterile technique utilized including caps, mask, sterile gowns, sterile gloves, large sterile drape, hand hygiene, and Chloroprep  Under sterile conditions and local anesthesia, micropuncture access was performed with ultrasound of the left upper extremity brachial vein above the elbow.  Guide wire was advanced followed by the 4- [place] dilator.  Dilator remains in place for contrast injection to perform venography.  In a similar fashion, the right brachial vein was accessed with a micropuncture needle utilizing direct ultrasound.  This was also done under sterile conditions.  Guide wire advanced followed by a micro dilator.  Dilator remains for contrast injection for a right upper extremity venogram  Left upper extremity:  Small caliber recanalized brachial veins evident in the upper arm.  Axillary vein is patent more centrally. Within the chest, the subclavian vein is patent with a focal stenosis at the distal clavicle and first rib.  Contrast eventually passes through this area to opacified a small caliber narrowed left innominate vein.  Jugular paraspinal collaterals noted.  SVC patent.  Right upper extremity:  Right brachial and axillary veins are patent peripherally.  Right subclavian vein also demonstrates a focal stenosis at the distal clavicle first rib however this is not significantly flow limiting as contrast easily passes through into a patent right innominate vein and SVC.  IMPRESSION: Upper extremity venous anatomy as described.  The right upper  extremity peripheral and central veins appear more favorable for dialysis access than the left side.   Original Report Authenticated By: Jerilynn Mages. Daryll Brod, M.D.    Ir US Guide Vasc Access Left  08/28/2011  *RADIOLOGY REPORT*  Clinical Data: End-stage renal disease, venous access planning for new  access, current access is a femoral tunneled catheter  ULTRASOUND GUIDANCE VASCULAR ACCESS BILATERAL UPPER EXTREMITY AND CENTRAL VENOGRAMS  Date:  08/28/2011 09:50:00  Radiologist:  M. Daryll Brod, M.D.  Medications:  1% lidocaine locally  Guidance:  Ultrasound fluoroscopic  Fluoroscopy time:  0.8 minutes  Sedation time:  None.  Contrast volume:  40 ml [id number]  Complications:  No immediate  PROCEDURE/FINDINGS:  Informed consent was obtained from the patient following explanation of the procedure, risks, benefits and alternatives. The patient understands, agrees and consents for the procedure. All questions were addressed.  A time out was performed.  Maximal barrier sterile technique utilized including caps, mask, sterile gowns, sterile gloves, large sterile drape, hand hygiene, and Chloroprep  Under sterile conditions and local anesthesia, micropuncture access was performed with ultrasound of the left upper extremity brachial vein above the elbow.  Guide wire was advanced followed by the 4- [place] dilator.  Dilator remains in place for contrast injection to perform venography.  In a similar fashion, the right brachial vein was accessed with a micropuncture needle utilizing direct ultrasound.  This was also done under sterile conditions.  Guide wire advanced followed by a micro dilator.  Dilator remains for contrast injection for a right upper extremity venogram  Left upper extremity:  Small caliber recanalized brachial veins evident in the upper arm.  Axillary vein is patent more centrally. Within the chest, the subclavian vein is patent with a focal stenosis at the distal clavicle and first rib.  Contrast eventually passes through this area to opacified a small caliber narrowed left innominate vein.  Jugular paraspinal collaterals noted.  SVC patent.  Right upper extremity:  Right brachial and axillary veins are patent peripherally.  Right subclavian vein also demonstrates a focal stenosis at the distal  clavicle first rib however this is not significantly flow limiting as contrast easily passes through into a patent right innominate vein and SVC.  IMPRESSION: Upper extremity venous anatomy as described.  The right upper extremity peripheral and central veins appear more favorable for dialysis access than the left side.   Original Report Authenticated By: Jerilynn Mages. Daryll Brod, M.D.    Ir US Guide Vasc Access Right  08/28/2011  *RADIOLOGY REPORT*  Clinical Data: End-stage renal disease, venous access planning for new access, current access is a femoral tunneled catheter  ULTRASOUND GUIDANCE VASCULAR ACCESS BILATERAL UPPER EXTREMITY AND CENTRAL VENOGRAMS  Date:  08/28/2011 09:50:00  Radiologist:  M. Daryll Brod, M.D.  Medications:  1% lidocaine locally  Guidance:  Ultrasound fluoroscopic  Fluoroscopy time:  0.8 minutes  Sedation time:  None.  Contrast volume:  40 ml [id number]  Complications:  No immediate  PROCEDURE/FINDINGS:  Informed consent was obtained from the patient following explanation of the procedure, risks, benefits and alternatives. The patient understands, agrees and consents for the procedure. All questions were addressed.  A time out was performed.  Maximal barrier sterile technique utilized including caps, mask, sterile gowns, sterile gloves, large sterile drape, hand hygiene, and Chloroprep  Under sterile conditions and local anesthesia, micropuncture access was performed with ultrasound of the left upper extremity brachial vein above the elbow.  Guide wire was advanced followed by the 4- [place] dilator.  Dilator remains in place for contrast injection to perform venography.  In a similar fashion, the right brachial vein was accessed with a micropuncture needle utilizing direct ultrasound.  This was also done under sterile conditions.  Guide wire advanced followed by a micro dilator.  Dilator remains for contrast injection for a right upper extremity venogram  Left upper extremity:  Small caliber  recanalized brachial veins evident in the upper arm.  Axillary vein is patent more centrally. Within the chest, the subclavian vein is patent with a focal stenosis at the distal clavicle and first rib.  Contrast eventually passes through this area to opacified a small caliber narrowed left innominate vein.  Jugular paraspinal collaterals noted.  SVC patent.  Right upper extremity:  Right brachial and axillary veins are patent peripherally.  Right subclavian vein also demonstrates a focal stenosis at the distal clavicle first rib however this is not significantly flow limiting as contrast easily passes through into a patent right innominate vein and SVC.  IMPRESSION: Upper extremity venous anatomy as described.  The right upper extremity peripheral and central veins appear more favorable for dialysis access than the left side.   Original Report Authenticated By: Jerilynn Mages. Daryll Brod, M.D.     I have reviewed the patient's current medications.  Assessment/Plan: 1 CRF vol ok for HD in am.  R [place] useable, ? Angioplasty, ? Perm access 2 Anemia stable 3 HPTH stable 4 HIV 5 Sepsis VRE switched to Dapto, will need to contact [place] to make sure they have 6 Dispo P discuss access with VVS., HD, Dapto    LOS: 8 days   Anushree Dorsi L 08/29/2011,10:28 AM

## 2011-08-29 NOTE — Progress Notes (Signed)
Subjective: Pt denies complaints today; denies ab pain. Still with active diarrhea   Objective: Vital signs in last 24 hours: Filed Vitals:   08/28/11 2051 08/29/11 0606 08/29/11 0623 08/29/11 0912  BP: 74/45 69/47 90/52  107/66  Pulse: 66 67  55  Temp: 98 F (36.7 C) 98.2 F (36.8 C)  97.8 F (36.6 C)  TempSrc: Oral Oral  Oral  Resp: 18 18  18   Height:      Weight: 103 lb 6.3 oz (46.9 kg)     SpO2: 97% 90%  92%   Weight change: 18 lb 8.3 oz (8.4 kg)  Intake/Output Summary (Last 24 hours) at 08/29/11 1208 Last data filed at 08/29/11 0800  Gross per 24 hour  Intake    240 ml  Output   -142 ml  Net    382 ml   Vitals reviewed BP 90/52, afebrile x 24 hours  General: resting in recliner, NAD, cachetic HEENT: no scleral icterus Cardiac: +systolic murmur no rubs, or gallops Pulm: clear to auscultation bilaterally, no wheezes, rales, or rhonchi Abd: soft, nontender, nondistended, BS present (normal) Ext: warm and well perfused, no pedal edema; right thigh diatek cath no evidence of infection Skin: hyperpig to b/l lower ext  Neuro: alert and oriented X3  Lab Results: Basic Metabolic Panel:  Lab [id number] 0607 08/28/11 0600  NA 135 134*  K 4.3 4.7  CL 100 99  CO2 26 28  GLUCOSE 79 75  BUN 8 17  CREATININE 3.03* 4.21*  CALCIUM 8.9 8.9  MG -- --  PHOS 2.6 3.2   Liver Function Tests:  Lab 08/29/11 0607 08/28/11 0600  AST -- --  ALT -- --  ALKPHOS -- --  BILITOT -- --  PROT -- --  ALBUMIN 2.3* 2.2*   CBC:  Lab 08/29/11 0607 08/28/11 0600  WBC 3.5* 4.7  NEUTROABS 2.3 2.0  HGB 10.1* 9.6*  HCT 33.0* 31.5*  MCV 101.5* 101.3*  PLT 124* 138*   CBG:  Lab 08/26/11 1137 08/23/11 1423 08/23/11 1341  GLUCAP 85 97 63*     Misc. Labs: no recent  Micro Results: Recent Results (from the past 240 hour(s))  CULTURE, BLOOD (ROUTINE X 2)     Status: Normal (Preliminary result)   Collection Time   08/21/11 11:35 PM      Component Value Range Status Comment   Specimen Description BLOOD LEFT ARM   Final    Special Requests BOTTLES DRAWN AEROBIC AND ANAEROBIC 5ML   Final    Culture  Setup Time 08/22/2011 04:58   Final    Culture     Final    Value: VANCOMYCIN RESISTANT ENTEROCOCCUS ISOLATED     Note: CRITICAL RESULT CALLED TO, READ BACK BY AND VERIFIED WITH: ASHLEY@ 1025 08/25/11 BY KRAWS     Note: Gram Stain Report Called to,Read Back By and Verified With: KIMBERLY PORTER @ 2150 ON 08/22/11 BY GOLLD   Report Status PENDING   Incomplete    Organism ID, Bacteria VANCOMYCIN RESISTANT ENTEROCOCCUS ISOLATED   Final   CULTURE, BLOOD (ROUTINE X 2)     Status: Normal   Collection Time   08/21/11 11:59 PM      Component Value Range Status Comment   Specimen Description BLOOD LEFT HAND   Final    Special Requests BOTTLES DRAWN AEROBIC AND ANAEROBIC 5CC   Final    Culture  Setup Time 08/22/2011 04:59   Final    Culture     Final  Value: ENTEROCOCCUS SPECIES     Note: SUSCEPTIBILITIES PERFORMED ON PREVIOUS CULTURE WITHIN THE LAST 5 DAYS.     Note: Gram Stain Report Called to,Read Back By and Verified With: KIMBERLY PORTER @ 2150 ON 08/22/11 BY GOLLD   Report Status 08/25/2011 FINAL   Final   MRSA PCR SCREENING     Status: Normal   Collection Time   08/22/11  5:24 AM      Component Value Range Status Comment   MRSA by PCR NEGATIVE  NEGATIVE Final   CLOSTRIDIUM DIFFICILE BY PCR     Status: Abnormal   Collection Time   08/22/11 10:48 AM      Component Value Range Status Comment   C difficile by pcr POSITIVE (*) NEGATIVE Final   CULTURE, BLOOD (ROUTINE X 2)     Status: Normal   Collection Time   08/22/11 11:15 PM      Component Value Range Status Comment   Specimen Description BLOOD RIGHT ARM   Final    Special Requests BOTTLES DRAWN AEROBIC AND ANAEROBIC 10CC   Final    Culture  Setup Time 08/23/2011 05:11   Final    Culture     Final    Value: ENTEROCOCCUS SPECIES     Note: SUSCEPTIBILITIES PERFORMED ON PREVIOUS CULTURE WITHIN THE LAST 5 DAYS.      Note: Gram Stain Report Called to,Read Back By and Verified With: RN K. GENGLER ON 08/23/11 AT 2145 BY DTERRY   Report Status 08/25/2011 FINAL   Final   CULTURE, BLOOD (ROUTINE X 2)     Status: Normal   Collection Time   08/22/11 11:20 PM      Component Value Range Status Comment   Specimen Description BLOOD RIGHT ARM   Final    Special Requests BOTTLES DRAWN AEROBIC AND ANAEROBIC 10CC   Final    Culture  Setup Time 08/23/2011 05:11   Final    Culture     Final    Value: ENTEROCOCCUS SPECIES     Note: SUSCEPTIBILITIES PERFORMED ON PREVIOUS CULTURE WITHIN THE LAST 5 DAYS.     Note: Gram Stain Report Called to,Read Back By and Verified With: RN K. GENGLER ON 08/23/11 AT 2145 BY DTERRY   Report Status 08/25/2011 FINAL   Final   CATH TIP CULTURE     Status: Normal   Collection Time   08/23/11  2:19 PM      Component Value Range Status Comment   Specimen Description CATH TIP   Final    Special Requests NONE   Final    Culture     Final    Value: 80 COLONIES VANCOMYCIN RESISTANT ENTEROCOCCUS ISOLATED     Note: RVB TINA O 08/27/11@830  BY REAMM   Report Status 08/27/2011 FINAL   Final    Organism ID, Bacteria VANCOMYCIN RESISTANT ENTEROCOCCUS ISOLATED   Final   CULTURE, BLOOD (ROUTINE X 2)     Status: Normal (Preliminary result)   Collection Time   08/26/11  7:55 AM      Component Value Range Status Comment   Specimen Description BLOOD HAND LEFT   Final    Special Requests BOTTLES DRAWN AEROBIC ONLY 10CC   Final    Culture  Setup Time 08/26/2011 15:00   Final    Culture     Final    Value:        BLOOD CULTURE RECEIVED NO GROWTH TO DATE CULTURE WILL BE HELD FOR 5 DAYS BEFORE  ISSUING A FINAL NEGATIVE REPORT   Report Status PENDING   Incomplete   CULTURE, BLOOD (ROUTINE X 2)     Status: Normal (Preliminary result)   Collection Time   08/26/11  8:05 AM      Component Value Range Status Comment   Specimen Description BLOOD LEFT FOREARM   Final    Special Requests BOTTLES DRAWN AEROBIC AND  ANAEROBIC 10CC EACH   Final    Culture  Setup Time 08/26/2011 15:01   Final    Culture     Final    Value:        BLOOD CULTURE RECEIVED NO GROWTH TO DATE CULTURE WILL BE HELD FOR 5 DAYS BEFORE ISSUING A FINAL NEGATIVE REPORT   Report Status PENDING   Incomplete    Studies/Results: Ir Veno/ext/bi  25-Sep-2011  *RADIOLOGY REPORT*  Clinical Data: End-stage renal disease, venous access planning for new access, current access is a femoral tunneled catheter  ULTRASOUND GUIDANCE VASCULAR ACCESS BILATERAL UPPER EXTREMITY AND CENTRAL VENOGRAMS  Date:  September 25, 2011 09:50:00  Radiologist:  M. Daryll Brod, M.D.  Medications:  1% lidocaine locally  Guidance:  Ultrasound fluoroscopic  Fluoroscopy time:  0.8 minutes  Sedation time:  None.  Contrast volume:  40 ml [id number]  Complications:  No immediate  PROCEDURE/FINDINGS:  Informed consent was obtained from the patient following explanation of the procedure, risks, benefits and alternatives. The patient understands, agrees and consents for the procedure. All questions were addressed.  A time out was performed.  Maximal barrier sterile technique utilized including caps, mask, sterile gowns, sterile gloves, large sterile drape, hand hygiene, and Chloroprep  Under sterile conditions and local anesthesia, micropuncture access was performed with ultrasound of the left upper extremity brachial vein above the elbow.  Guide wire was advanced followed by the 4- [place] dilator.  Dilator remains in place for contrast injection to perform venography.  In a similar fashion, the right brachial vein was accessed with a micropuncture needle utilizing direct ultrasound.  This was also done under sterile conditions.  Guide wire advanced followed by a micro dilator.  Dilator remains for contrast injection for a right upper extremity venogram  Left upper extremity:  Small caliber recanalized brachial veins evident in the upper arm.  Axillary vein is patent more centrally. Within the chest,  the subclavian vein is patent with a focal stenosis at the distal clavicle and first rib.  Contrast eventually passes through this area to opacified a small caliber narrowed left innominate vein.  Jugular paraspinal collaterals noted.  SVC patent.  Right upper extremity:  Right brachial and axillary veins are patent peripherally.  Right subclavian vein also demonstrates a focal stenosis at the distal clavicle first rib however this is not significantly flow limiting as contrast easily passes through into a patent right innominate vein and SVC.  IMPRESSION: Upper extremity venous anatomy as described.  The right upper extremity peripheral and central veins appear more favorable for dialysis access than the left side.   Original Report Authenticated By: Jerilynn Mages. Daryll Brod, M.D.    Ir US Guide Vasc Access Left  09-25-2011  *RADIOLOGY REPORT*  Clinical Data: End-stage renal disease, venous access planning for new access, current access is a femoral tunneled catheter  ULTRASOUND GUIDANCE VASCULAR ACCESS BILATERAL UPPER EXTREMITY AND CENTRAL VENOGRAMS  Date:  2011/09/25 09:50:00  Radiologist:  M. Daryll Brod, M.D.  Medications:  1% lidocaine locally  Guidance:  Ultrasound fluoroscopic  Fluoroscopy time:  0.8 minutes  Sedation time:  None.  Contrast volume:  40 ml [id number]  Complications:  No immediate  PROCEDURE/FINDINGS:  Informed consent was obtained from the patient following explanation of the procedure, risks, benefits and alternatives. The patient understands, agrees and consents for the procedure. All questions were addressed.  A time out was performed.  Maximal barrier sterile technique utilized including caps, mask, sterile gowns, sterile gloves, large sterile drape, hand hygiene, and Chloroprep  Under sterile conditions and local anesthesia, micropuncture access was performed with ultrasound of the left upper extremity brachial vein above the elbow.  Guide wire was advanced followed by the 4- [place]  dilator.  Dilator remains in place for contrast injection to perform venography.  In a similar fashion, the right brachial vein was accessed with a micropuncture needle utilizing direct ultrasound.  This was also done under sterile conditions.  Guide wire advanced followed by a micro dilator.  Dilator remains for contrast injection for a right upper extremity venogram  Left upper extremity:  Small caliber recanalized brachial veins evident in the upper arm.  Axillary vein is patent more centrally. Within the chest, the subclavian vein is patent with a focal stenosis at the distal clavicle and first rib.  Contrast eventually passes through this area to opacified a small caliber narrowed left innominate vein.  Jugular paraspinal collaterals noted.  SVC patent.  Right upper extremity:  Right brachial and axillary veins are patent peripherally.  Right subclavian vein also demonstrates a focal stenosis at the distal clavicle first rib however this is not significantly flow limiting as contrast easily passes through into a patent right innominate vein and SVC.  IMPRESSION: Upper extremity venous anatomy as described.  The right upper extremity peripheral and central veins appear more favorable for dialysis access than the left side.   Original Report Authenticated By: Jerilynn Mages. Daryll Brod, M.D.    Ir US Guide Vasc Access Right  08/28/2011  *RADIOLOGY REPORT*  Clinical Data: End-stage renal disease, venous access planning for new access, current access is a femoral tunneled catheter  ULTRASOUND GUIDANCE VASCULAR ACCESS BILATERAL UPPER EXTREMITY AND CENTRAL VENOGRAMS  Date:  08/28/2011 09:50:00  Radiologist:  M. Daryll Brod, M.D.  Medications:  1% lidocaine locally  Guidance:  Ultrasound fluoroscopic  Fluoroscopy time:  0.8 minutes  Sedation time:  None.  Contrast volume:  40 ml [id number]  Complications:  No immediate  PROCEDURE/FINDINGS:  Informed consent was obtained from the patient following explanation of the  procedure, risks, benefits and alternatives. The patient understands, agrees and consents for the procedure. All questions were addressed.  A time out was performed.  Maximal barrier sterile technique utilized including caps, mask, sterile gowns, sterile gloves, large sterile drape, hand hygiene, and Chloroprep  Under sterile conditions and local anesthesia, micropuncture access was performed with ultrasound of the left upper extremity brachial vein above the elbow.  Guide wire was advanced followed by the 4- [place] dilator.  Dilator remains in place for contrast injection to perform venography.  In a similar fashion, the right brachial vein was accessed with a micropuncture needle utilizing direct ultrasound.  This was also done under sterile conditions.  Guide wire advanced followed by a micro dilator.  Dilator remains for contrast injection for a right upper extremity venogram  Left upper extremity:  Small caliber recanalized brachial veins evident in the upper arm.  Axillary vein is patent more centrally. Within the chest, the subclavian vein is patent with a focal stenosis at the distal clavicle and first rib.  Contrast eventually passes through this  area to opacified a small caliber narrowed left innominate vein.  Jugular paraspinal collaterals noted.  SVC patent.  Right upper extremity:  Right brachial and axillary veins are patent peripherally.  Right subclavian vein also demonstrates a focal stenosis at the distal clavicle first rib however this is not significantly flow limiting as contrast easily passes through into a patent right innominate vein and SVC.  IMPRESSION: Upper extremity venous anatomy as described.  The right upper extremity peripheral and central veins appear more favorable for dialysis access than the left side.   Original Report Authenticated By: Jerilynn Mages. Daryll Brod, M.D.     TTE 8/17 shows EF 50-55%, mild LVH, grade 1 diastolic dysfunction, mild AR, trivial pericardial effusion ant.  Heart   TEE 8/21 mod hypertrophied LV; EF 35-40%; global hypokinesis and inf/inferiorlateral akinesis no thrombus/mass; mild thick MV; small calcified mobile mass on chordae tendinae possibly potentially ruptured cord or old organized thrombus; trace mild MR; aortic valve with trace central regurg   Medications:  Scheduled Meds:    . aspirin  81 mg Oral Daily  . DAPTOmycin (CUBICIN)  IV  6 mg/kg (Order-Specific) Intravenous Custom  . DAPTOmycin (CUBICIN)  IV  6 mg/kg (Order-Specific) Intravenous Once  . darbepoetin  100 mcg Intravenous Q Sat-HD  . darunavir  800 mg Oral Q breakfast  . feeding supplement  237 mL Oral BID BM  . lamiVUDine  50 mg Oral Daily  . midodrine  10 mg Oral BID WC  . multivitamin  1 tablet Oral QHS  . pantoprazole  20 mg Oral Q1200  . paricalcitol  4 mcg Intravenous Q T,Th,Sa-HD  . ritonavir  100 mg Oral Q breakfast  . sodium chloride  3 mL Intravenous Q12H  . stavudine  15 mg Oral Daily  . tuberculin  5 Units Intradermal Once  . vancomycin  125 mg Oral QID  . DISCONTD: DAPTOmycin (CUBICIN)  IV  6 mg/kg (Order-Specific) Intravenous Custom  . DISCONTD: DAPTOmycin (CUBICIN)  IV  6 mg/kg (Order-Specific) Intravenous Once  . DISCONTD: heparin subcutaneous  5,000 Units Subcutaneous Q8H   Continuous Infusions:   PRN Meds:.sodium chloride, sodium chloride, acetaminophen, heparin, heparin, heparin, heparin, iohexol, lidocaine, lidocaine-prilocaine, ondansetron (ZOFRAN) IV, ondansetron, oxyCODONE-acetaminophen, pentafluoroprop-tetrafluoroeth  Assessment/Plan:   56 y.o PMH HIV, HTN, ESRD on HD TTHSat, h/o bacteriuria with VRE 03/2010, h/o MSSA and coag neg staph bacteremia and potential endocarditis 2/2 left IJ infection 05/2011, h/o C. Diff (07/2011), MGUS, h/o  anemia, h/o pancytopenia/thrombocytopenia with EF 40-45% prior to admission.  She presented 8/16 with fever and ab pain.    1. Bacteremia -4 blood Cxs aerobic/ anaerobic +VRE and previous cath tip  +enterococcus 80K colonies  -old HD cath removed 8/17 d/t HD related bacteremia  Plan -pending repeat blood cultures NTD will follow  -previously received Rocephin 1 g q24 hours iv and Vancomycin iv 500 mg x 3 days this admission then pt Rx Zyvoxx 600 mg iv since 8/18 to 8/22 -d/c Zyvoxx d/t risk of worsening pancytopenia/thrombocytopenia -cont Daptomycin (8/22) dosed q48 with HD  -cardiology consulted this admission but signed off  -ID consulted and following-pt needs outpt f/u with Dr. Tommy Medal 2 weeks after blood Cx neg-pt may need abx duration of 2 wks to 8 wks but Dr. Tommy Medal will determine in future  -renal and VVS following pt -will notify pt's new HD center will need to be notified of need for Daptomycin 3x q week  2. C.diff -still with diarrhea  -isolation precautions and  protocol -Rx oral Vancomycin 125 mg qid (since 8/16) will tx for duration of abx therapy for bacteremia plus 2 weeks after discontinue abx for bacteremia   3. Bradycardia  -pt is asymptomatic, HR 67 this am but still ranging 40s-80s -will monitor, NTD currently   4. Ischemic and Nonischemic cardiomyopathy -EF 35-40% -nuclear stress 05/2011-showed large fixed inferior defect which is likely scar consistent with TEE today -cards rec medical management: avoid BB d/t bradycardia but can try ACEI or ARB when bp can tolerate -cards consulted they also rec vasodilator tx if bp improves  5. History of hypotension -noted on admission and throughout admission -BP 90/52 today  -cont hold home Lopressor  -Cont Midodrine 10 mg bid  -will monitor bp -careful with IVF boluses 2/2 #4  6. HIV INFECTION  -repeat cd 4 ct 8/20 was 310; HIV genotype pending -home meds (Norvir, Epivir, Prezista, Zerit) -no PCP px with Bactrim 3x per week per ID Dr. Tommy Medal as of now -ID following and pt will have outpt ID f/u   7. ESRD (end stage renal disease) (03/19/2007) -renal consulted and following -Cr. baseline 3-4 range  -HD  access chest pulled after dialysis 8/17 d/t bacteremia -new diatek cath placed right fem vein on 8/20 by VVS -HD TTHSat  -venography 8/22 showed right upper ext as possible favorable new access site -will ask that renal comment on if new access will be done this admission; also will ask renal if it is ok that pt receive HD from [facility] b/c this is the only HD center SNF will transport her too;  If she gets into [facility] SNF she will may no longer be affiliated with [facility] in the future.    8. ANEMIA OF CHRONIC DISEASE -Aransep   9. Pancytopenia/Thrombocytopenia  -chronic pancytopenia, thrombocytopenia noted 8/15 with plts 146 -pt has a h/o chronic thrombocytopenia as well -plts trended 124 this am, trended slightly down since yesterday they were 138 -will monitor cbc   10. DVT/GIPx -scds -protonix   11. F/E/N -renal following electrolytes -diet ordered   12. Dispo -d/c possibly Monday to [place] work working on [employer] and [facility]   Code status: full   LOS: 8 days   Cresenciano Genre [id number] 08/29/2011, 12:08 PM

## 2011-08-29 NOTE — Progress Notes (Signed)
[facility]  DAILY PROGRESS NOTE   Subjective:  No cardiac events overnight. Denies chest pain. No shortness of breath.  Objective:  Temp:  [98 F (36.7 C)-98.5 F (36.9 C)] 98.2 F (36.8 C) (08/23 0606) Pulse Rate:  [43-75] 67  (08/23 0606) Resp:  [15-20] 18  (08/23 0606) BP: (69-110)/(36-61) 90/52 mmHg (08/23 0623) SpO2:  [90 %-97 %] 90 % (08/23 0606) Weight:  [46.9 kg (103 lb 6.3 oz)-55.6 kg (122 lb 9.2 oz)] 46.9 kg (103 lb 6.3 oz) (08/22 2051) Weight change: 8.4 kg (18 lb 8.3 oz)  Intake/Output from previous day: 08/22 0701 - 08/23 0700 In: 0  Out: -140 [Urine:1; Stool:1]  Intake/Output from this shift:    Medications: Current Facility-Administered Medications  Medication Dose Route Frequency Provider Last Rate Last Dose  . 0.9 %  sodium chloride infusion  100 mL Intravenous PRN Sol Blazing, MD      . 0.9 %  sodium chloride infusion  100 mL Intravenous PRN Sol Blazing, MD      . acetaminophen (TYLENOL) tablet 650 mg  650 mg Oral Q6H PRN Bartholomew Crews, MD      . aspirin chewable tablet 81 mg  81 mg Oral Daily Bartholomew Crews, MD   81 mg at 08/28/11 1925  . DAPTOmycin (CUBICIN) 282 mg in sodium chloride 0.9 % IVPB  6 mg/kg (Order-Specific) Intravenous Custom Rolla Flatten, PHARMD      . DAPTOmycin (CUBICIN) 282 mg in sodium chloride 0.9 % IVPB  6 mg/kg (Order-Specific) Intravenous Once Rolla Flatten, PHARMD   282 mg at 08/28/11 2215  . darbepoetin (ARANESP) injection 100 mcg  100 mcg Intravenous Q Sat-HD Axel Filler, MD      . Darunavir Ethanolate (PREZISTA) tablet 800 mg  800 mg Oral Q breakfast Rosalia Hammers, MD   800 mg at 08/29/11 0743  . feeding supplement (ENSURE COMPLETE) liquid 237 mL  237 mL Oral BID BM Myriam Jacobson, PA   237 mL at 08/27/11 1353  . heparin injection 1,000 Units  1,000 Units Dialysis PRN Sol Blazing, MD      . heparin injection 1,000 Units  20 Units/kg Dialysis PRN Ramiro Harvest, PA   1,000 Units at 08/28/11 1345  . heparin injection 4,800 Units  100 Units/kg Dialysis PRN Ramiro Harvest, PA   4,800 Units at 08/26/11 1919  . heparin injection 5,900 Units  5,900 Units Dialysis PRN Sol Blazing, MD      . iohexol (OMNIPAQUE) 300 MG/ML solution 100 mL  100 mL Intravenous Once PRN Medication Radiologist, MD   40 mL at 08/28/11 1217  . lamiVUDine (EPIVIR) 10 MG/ML solution 50 mg  50 mg Oral Daily Rosalia Hammers, MD   50 mg at 08/28/11 2130  . lidocaine (XYLOCAINE) 1 % injection 5 mL  5 mL Intradermal PRN Sol Blazing, MD      . lidocaine-prilocaine (EMLA) cream 1 application  1 application Topical PRN Sol Blazing, MD      . midodrine (PROAMATINE) tablet 10 mg  10 mg Oral BID WC Sol Blazing, MD   10 mg at 08/29/11 [id number]  . multivitamin (RENA-VIT) tablet 1 tablet  1 tablet Oral QHS Rolla Flatten, PHARMD   1 tablet at 08/28/11 2125  . ondansetron (ZOFRAN) tablet 4 mg  4 mg Oral Q6H PRN Rosalia Hammers, MD   4 mg at 08/25/11 1430   Or  .  ondansetron (ZOFRAN) injection 4 mg  4 mg Intravenous Q6H PRN Rosalia Hammers, MD      . oxyCODONE-acetaminophen (PERCOCET/ROXICET) 5-325 MG per tablet 1-2 tablet  1-2 tablet Oral Q4H PRN Gae Gallop, MD      . pantoprazole (PROTONIX) EC tablet 20 mg  20 mg Oral Q1200 Cresenciano Genre, MD   20 mg at 08/28/11 1925  . paricalcitol (ZEMPLAR) injection 4 mcg  4 mcg Intravenous Q T,Th,Sa-HD Lauren Bajbus, PHARMD   4 mcg at 08/28/11 1653  . pentafluoroprop-tetrafluoroeth (GEBAUERS) aerosol 1 application  1 application Topical PRN Sol Blazing, MD      . ritonavir (NORVIR) tablet 100 mg  100 mg Oral Q breakfast Rosalia Hammers, MD   100 mg at 08/29/11 0743  . sodium chloride 0.9 % injection 3 mL  3 mL Intravenous Q12H Rosalia Hammers, MD   3 mL at 08/28/11 2134  . stavudine (ZERIT) capsule 15 mg  15 mg Oral Daily Truman Hayward, MD   15 mg at 08/28/11 2131  . tuberculin injection 5 Units  5 Units Intradermal Once Cresenciano Genre, MD   5 Units at 08/28/11 2127  . vancomycin (VANCOCIN) 50 mg/mL oral solution 125 mg  125 mg Oral QID Truman Hayward, MD   125 mg at 08/28/11 2131  . DISCONTD: DAPTOmycin (CUBICIN) 282 mg in sodium chloride 0.9 % IVPB  6 mg/kg (Order-Specific) Intravenous Custom Rolla Flatten, PHARMD      . DISCONTD: DAPTOmycin (CUBICIN) 282 mg in sodium chloride 0.9 % IVPB  6 mg/kg (Order-Specific) Intravenous Once Rolla Flatten, [place]: heparin injection 5,000 Units  5,000 Units Subcutaneous Q8H Cresenciano Genre, MD   5,000 Units at 08/28/11 [phone]  . DISCONTD: linezolid (ZYVOX) IVPB 600 mg  600 mg Intravenous Q12H Campbell Riches, MD   600 mg at 08/27/11 2249    Physical Exam: General appearance: alert and no distress Neck: no adenopathy, no carotid bruit, no JVD, supple, symmetrical, trachea midline and thyroid not enlarged, symmetric, no tenderness/mass/nodules Lungs: rales bibasilar Heart: regular rate and rhythm, no S3 or S4 and systolic murmur: early systolic 2/6, crescendo at apex Abdomen: soft, non-tender; bowel sounds normal; no masses,  no organomegaly Extremities: extremities normal, atraumatic, no cyanosis or edema Pulses: 2+ and symmetric  Lab Results: Results for orders placed during the hospital encounter of 08/21/11 (from the past 48 hour(s))  RENAL FUNCTION PANEL     Status: Abnormal   Collection Time   08/28/11  6:00 AM      Component Value Range Comment   Sodium 134 (*) 135 - 145 mEq/L    Potassium 4.7  3.5 - 5.1 mEq/L    Chloride 99  96 - 112 mEq/L    CO2 28  19 - 32 mEq/L    Glucose, Bld 75  70 - 99 mg/dL    BUN 17  6 - 23 mg/dL    Creatinine, Ser 4.21 (*) 0.50 - 1.10 mg/dL    Calcium 8.9  8.4 - 10.5 mg/dL    Phosphorus 3.2  2.3 - 4.6 mg/dL    Albumin 2.2 (*) 3.5 - 5.2 g/dL    GFR calc non Af Amer 11 (*) >90 mL/min    GFR calc Af Amer 13 (*) >90 mL/min   CBC WITH DIFFERENTIAL     Status: Abnormal   Collection Time   08/28/11  6:00 AM  Component Value Range Comment   WBC 4.7  4.0 - 10.5 K/uL    RBC 3.11 (*) 3.87 - 5.11 MIL/uL    Hemoglobin 9.6 (*) 12.0 - 15.0 g/dL    HCT 31.5 (*) 36.0 - 46.0 %    MCV 101.3 (*) 78.0 - 100.0 fL    MCH 30.9  26.0 - 34.0 pg    MCHC 30.5  30.0 - 36.0 g/dL    RDW 16.5 (*) 11.5 - 15.5 %    Platelets 138 (*) 150 - 400 K/uL    Neutrophils Relative 42 (*) 43 - 77 %    Neutro Abs 2.0  1.7 - 7.7 K/uL    Lymphocytes Relative 44  12 - 46 %    Lymphs Abs 2.0  0.7 - 4.0 K/uL    Monocytes Relative 14 (*) 3 - 12 %    Monocytes Absolute 0.6  0.1 - 1.0 K/uL    Eosinophils Relative 1  0 - 5 %    Eosinophils Absolute 0.0  0.0 - 0.7 K/uL    Basophils Relative 0  0 - 1 %    Basophils Absolute 0.0  0.0 - 0.1 K/uL   RENAL FUNCTION PANEL     Status: Abnormal   Collection Time   08/29/11  6:07 AM      Component Value Range Comment   Sodium 135  135 - 145 mEq/L    Potassium 4.3  3.5 - 5.1 mEq/L    Chloride 100  96 - 112 mEq/L    CO2 26  19 - 32 mEq/L    Glucose, Bld 79  70 - 99 mg/dL    BUN 8  6 - 23 mg/dL    Creatinine, Ser 3.03 (*) 0.50 - 1.10 mg/dL    Calcium 8.9  8.4 - 10.5 mg/dL    Phosphorus 2.6  2.3 - 4.6 mg/dL    Albumin 2.3 (*) 3.5 - 5.2 g/dL    GFR calc non Af Amer 16 (*) >90 mL/min    GFR calc Af Amer 19 (*) >90 mL/min   CBC WITH DIFFERENTIAL     Status: Abnormal   Collection Time   08/29/11  6:07 AM      Component Value Range Comment   WBC 3.5 (*) 4.0 - 10.5 K/uL    RBC 3.25 (*) 3.87 - 5.11 MIL/uL    Hemoglobin 10.1 (*) 12.0 - 15.0 g/dL    HCT 33.0 (*) 36.0 - 46.0 %    MCV 101.5 (*) 78.0 - 100.0 fL    MCH 31.1  26.0 - 34.0 pg    MCHC 30.6  30.0 - 36.0 g/dL    RDW 16.4 (*) 11.5 - 15.5 %    Platelets 124 (*) 150 - 400 K/uL    Neutrophils Relative 66  43 - 77 %    Neutro Abs 2.3  1.7 - 7.7 K/uL    Lymphocytes Relative 22  12 - 46 %    Lymphs Abs 0.8  0.7 - 4.0 K/uL    Monocytes Relative 8  3 - 12 %    Monocytes Absolute 0.3  0.1 - 1.0 K/uL    Eosinophils Relative 3  0 - 5 %     Eosinophils Absolute 0.1  0.0 - 0.7 K/uL    Basophils Relative 1  0 - 1 %    Basophils Absolute 0.0  0.0 - 0.1 K/uL   CK     Status: Normal   Collection Time  08/29/11  6:07 AM      Component Value Range Comment   Total CK 63  7 - 177 U/L     Imaging: Ir Veno/ext/bi  08/28/2011  *RADIOLOGY REPORT*  Clinical Data: End-stage renal disease, venous access planning for new access, current access is a femoral tunneled catheter  ULTRASOUND GUIDANCE VASCULAR ACCESS BILATERAL UPPER EXTREMITY AND CENTRAL VENOGRAMS  Date:  08/28/2011 09:50:00  Radiologist:  M. Daryll Brod, M.D.  Medications:  1% lidocaine locally  Guidance:  Ultrasound fluoroscopic  Fluoroscopy time:  0.8 minutes  Sedation time:  None.  Contrast volume:  40 ml [id number]  Complications:  No immediate  PROCEDURE/FINDINGS:  Informed consent was obtained from the patient following explanation of the procedure, risks, benefits and alternatives. The patient understands, agrees and consents for the procedure. All questions were addressed.  A time out was performed.  Maximal barrier sterile technique utilized including caps, mask, sterile gowns, sterile gloves, large sterile drape, hand hygiene, and Chloroprep  Under sterile conditions and local anesthesia, micropuncture access was performed with ultrasound of the left upper extremity brachial vein above the elbow.  Guide wire was advanced followed by the 4- [place] dilator.  Dilator remains in place for contrast injection to perform venography.  In a similar fashion, the right brachial vein was accessed with a micropuncture needle utilizing direct ultrasound.  This was also done under sterile conditions.  Guide wire advanced followed by a micro dilator.  Dilator remains for contrast injection for a right upper extremity venogram  Left upper extremity:  Small caliber recanalized brachial veins evident in the upper arm.  Axillary vein is patent more centrally. Within the chest, the subclavian vein is  patent with a focal stenosis at the distal clavicle and first rib.  Contrast eventually passes through this area to opacified a small caliber narrowed left innominate vein.  Jugular paraspinal collaterals noted.  SVC patent.  Right upper extremity:  Right brachial and axillary veins are patent peripherally.  Right subclavian vein also demonstrates a focal stenosis at the distal clavicle first rib however this is not significantly flow limiting as contrast easily passes through into a patent right innominate vein and SVC.  IMPRESSION: Upper extremity venous anatomy as described.  The right upper extremity peripheral and central veins appear more favorable for dialysis access than the left side.   Original Report Authenticated By: Jerilynn Mages. Daryll Brod, M.D.    Ir US Guide Vasc Access Left  08/28/2011  *RADIOLOGY REPORT*  Clinical Data: End-stage renal disease, venous access planning for new access, current access is a femoral tunneled catheter  ULTRASOUND GUIDANCE VASCULAR ACCESS BILATERAL UPPER EXTREMITY AND CENTRAL VENOGRAMS  Date:  08/28/2011 09:50:00  Radiologist:  M. Daryll Brod, M.D.  Medications:  1% lidocaine locally  Guidance:  Ultrasound fluoroscopic  Fluoroscopy time:  0.8 minutes  Sedation time:  None.  Contrast volume:  40 ml [id number]  Complications:  No immediate  PROCEDURE/FINDINGS:  Informed consent was obtained from the patient following explanation of the procedure, risks, benefits and alternatives. The patient understands, agrees and consents for the procedure. All questions were addressed.  A time out was performed.  Maximal barrier sterile technique utilized including caps, mask, sterile gowns, sterile gloves, large sterile drape, hand hygiene, and Chloroprep  Under sterile conditions and local anesthesia, micropuncture access was performed with ultrasound of the left upper extremity brachial vein above the elbow.  Guide wire was advanced followed by the 4- [place] dilator.  Dilator remains in  place  for contrast injection to perform venography.  In a similar fashion, the right brachial vein was accessed with a micropuncture needle utilizing direct ultrasound.  This was also done under sterile conditions.  Guide wire advanced followed by a micro dilator.  Dilator remains for contrast injection for a right upper extremity venogram  Left upper extremity:  Small caliber recanalized brachial veins evident in the upper arm.  Axillary vein is patent more centrally. Within the chest, the subclavian vein is patent with a focal stenosis at the distal clavicle and first rib.  Contrast eventually passes through this area to opacified a small caliber narrowed left innominate vein.  Jugular paraspinal collaterals noted.  SVC patent.  Right upper extremity:  Right brachial and axillary veins are patent peripherally.  Right subclavian vein also demonstrates a focal stenosis at the distal clavicle first rib however this is not significantly flow limiting as contrast easily passes through into a patent right innominate vein and SVC.  IMPRESSION: Upper extremity venous anatomy as described.  The right upper extremity peripheral and central veins appear more favorable for dialysis access than the left side.   Original Report Authenticated By: Jerilynn Mages. Daryll Brod, M.D.    Ir US Guide Vasc Access Right  08/28/2011  *RADIOLOGY REPORT*  Clinical Data: End-stage renal disease, venous access planning for new access, current access is a femoral tunneled catheter  ULTRASOUND GUIDANCE VASCULAR ACCESS BILATERAL UPPER EXTREMITY AND CENTRAL VENOGRAMS  Date:  08/28/2011 09:50:00  Radiologist:  M. Daryll Brod, M.D.  Medications:  1% lidocaine locally  Guidance:  Ultrasound fluoroscopic  Fluoroscopy time:  0.8 minutes  Sedation time:  None.  Contrast volume:  40 ml [id number]  Complications:  No immediate  PROCEDURE/FINDINGS:  Informed consent was obtained from the patient following explanation of the procedure, risks, benefits and  alternatives. The patient understands, agrees and consents for the procedure. All questions were addressed.  A time out was performed.  Maximal barrier sterile technique utilized including caps, mask, sterile gowns, sterile gloves, large sterile drape, hand hygiene, and Chloroprep  Under sterile conditions and local anesthesia, micropuncture access was performed with ultrasound of the left upper extremity brachial vein above the elbow.  Guide wire was advanced followed by the 4- [place] dilator.  Dilator remains in place for contrast injection to perform venography.  In a similar fashion, the right brachial vein was accessed with a micropuncture needle utilizing direct ultrasound.  This was also done under sterile conditions.  Guide wire advanced followed by a micro dilator.  Dilator remains for contrast injection for a right upper extremity venogram  Left upper extremity:  Small caliber recanalized brachial veins evident in the upper arm.  Axillary vein is patent more centrally. Within the chest, the subclavian vein is patent with a focal stenosis at the distal clavicle and first rib.  Contrast eventually passes through this area to opacified a small caliber narrowed left innominate vein.  Jugular paraspinal collaterals noted.  SVC patent.  Right upper extremity:  Right brachial and axillary veins are patent peripherally.  Right subclavian vein also demonstrates a focal stenosis at the distal clavicle first rib however this is not significantly flow limiting as contrast easily passes through into a patent right innominate vein and SVC.  IMPRESSION: Upper extremity venous anatomy as described.  The right upper extremity peripheral and central veins appear more favorable for dialysis access than the left side.   Original Report Authenticated By: Jerilynn Mages. Daryll Brod, M.D.     Assessment:  1. Principal Problem: 2.  *Bacteremia 3. Active Problems: 4.  HIV INFECTION 5.  ANEMIA OF CHRONIC DISEASE 6.   Pancytopenia 7.  ESRD (end stage renal disease) 8.  Protein calorie malnutrition 9.  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  C. difficile colitis 11.  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  Abnormal EKG, chronic AS TWI 13.  NSVT (nonsustained ventricular tachycardia) 14.  Hypotension 15.  Bradycardia 16.  Systolic dysfunction 17.  Thrombocytopenia 18.   Plan:  1. Clinically improving with antibiotics and dialysis. Would consider addition of ACE-I if blood pressure can eventually tolerate it.  No further cardiac recommendations at this time. Will arrange follow-up appointment with me in the office and sign-off. Call with questions.  Time Spent Directly with Patient:  15 minutes  Length of Stay:  LOS: 8 days   Pixie Casino, MD, [facility] Attending Cardiologist [facility] [place] [facility] 08/29/2011, 8:22 AM

## 2011-08-29 NOTE — Progress Notes (Signed)
Nutrition Follow-up  Intervention:   1. Continue least restrictive diet per team 2. Continue Ensure Complete to supplement meals 3. RD to continue to follow nutrition care plan  Assessment:   Underwent insertion of new HD catheter on 8/20. Zyvox discontinued per MD 2/2 risk of worsening pancytopenia/thrombocytopenia.  Intake remains variable, ranging form 0 - 50%. Pt states that she is not eating well 2/2 food choices. Discussed diet liberalization with Dr. Aundra Dubin to help with PO intake and ongoing malnutrition, appreciate liberalization to Regular diet. This RD helped pt choose new meals for lunch and dinner, pt excited about eating this weekend. Pt continues to drink Ensure.  Diet Order:  Regular Supplement: Ensure Complete PO BID  Meds: Scheduled Meds:    . aspirin  81 mg Oral Daily  . DAPTOmycin (CUBICIN)  IV  6 mg/kg (Order-Specific) Intravenous Custom  . DAPTOmycin (CUBICIN)  IV  6 mg/kg (Order-Specific) Intravenous Once  . darbepoetin  100 mcg Intravenous Q Sat-HD  . darunavir  800 mg Oral Q breakfast  . feeding supplement  237 mL Oral BID BM  . lamiVUDine  50 mg Oral Daily  . midodrine  10 mg Oral BID WC  . multivitamin  1 tablet Oral QHS  . pantoprazole  20 mg Oral Q1200  . paricalcitol  4 mcg Intravenous Q T,Th,Sa-HD  . ritonavir  100 mg Oral Q breakfast  . sodium chloride  3 mL Intravenous Q12H  . stavudine  15 mg Oral Daily  . tuberculin  5 Units Intradermal Once  . vancomycin  125 mg Oral QID  . DISCONTD: DAPTOmycin (CUBICIN)  IV  6 mg/kg (Order-Specific) Intravenous Custom  . DISCONTD: DAPTOmycin (CUBICIN)  IV  6 mg/kg (Order-Specific) Intravenous Once  . DISCONTD: heparin subcutaneous  5,000 Units Subcutaneous Q8H   Continuous Infusions:  PRN Meds:.sodium chloride, sodium chloride, acetaminophen, heparin, heparin, heparin, heparin, iohexol, lidocaine, lidocaine-prilocaine, ondansetron (ZOFRAN) IV, ondansetron, oxyCODONE-acetaminophen,  pentafluoroprop-tetrafluoroeth  Labs:  CMP     Component Value Date/Time   NA 135 08/29/2011 0607   K 4.3 08/29/2011 0607   CL 100 08/29/2011 0607   CO2 26 08/29/2011 0607   GLUCOSE 79 08/29/2011 0607   BUN 8 08/29/2011 0607   CREATININE 3.03* 08/29/2011 0607   CREATININE 4.98* 02/19/2011 1048   CALCIUM 8.9 08/29/2011 0607   CALCIUM 7.6* 09/14/2007 1321   PROT 8.9* 08/21/2011 2342   ALBUMIN 2.3* 08/29/2011 0607   AST 26 08/21/2011 2342   ALT 5 08/21/2011 2342   ALKPHOS 120* 08/21/2011 2342   BILITOT 0.3 08/21/2011 2342   GFRNONAA 16* 08/29/2011 0607   GFRAA 19* 08/29/2011 0607   Phosphorus  Date/Time Value Range Status  08/29/2011  6:07 AM 2.6  2.3 - 4.6 mg/dL Final  08/28/2011  6:00 AM 3.2  2.3 - 4.6 mg/dL Final  08/27/2011  6:35 AM 3.5  2.3 - 4.6 mg/dL Final   Magnesium  Date/Time Value Range Status  08/22/2011  1:00 AM 1.7  1.5 - 2.5 mg/dL Final  05/27/2011  3:11 PM 1.9  1.5 - 2.5 mg/dL Final  09/11/2010  4:30 AM 1.7  1.5 - 2.5 mg/dL Final   Potassium  Date/Time Value Range Status  08/29/2011  6:07 AM 4.3  3.5 - 5.1 mEq/L Final  08/28/2011  6:00 AM 4.7  3.5 - 5.1 mEq/L Final  08/27/2011  6:35 AM 5.3* 3.5 - 5.1 mEq/L Final    Intake/Output Summary (Last 24 hours) at 08/29/11 1144 Last data filed at 08/29/11 0800  Gross per 24 hour  Intake    240 ml  Output   -142 ml  Net    382 ml  BM 8/18  Weight Status:  103 lb - trending down 105 lb/48 kg s/p HD 8/18  Body mass index is 18.32 kg/(m^2). Underweight  Estimated needs:  1600 - 1800 kcal, 70 - 80 grams protein  Nutrition Dx:  Malnutrition r/t abdominal pain resulting in poor oral intake AEB intake < 75% of estimated energy requirements for at least the past 2 months, and 19% weight loss in 4 months.  Goal:  Intake to meet >90% of estimated nutrition needs to prevent further depletion of lean body mass. Not met.  Monitor:  PO intake, weights, labs  Inda Coke MS, [place], LDN Pager: [phone] After-hours pager: [phone]

## 2011-08-29 NOTE — Progress Notes (Signed)
Clinical Social Work-CSW in process of facilitating transfer of pt dialysis center-awaiting call back from [facility] and will continue to follow to facilitate d/c planning- Gerre Scull, [phone]

## 2011-08-29 NOTE — Progress Notes (Signed)
Subjective: Interval History: none..   Objective: Vital signs in last 24 hours: Temp:  [97.8 F (36.6 C)-98.5 F (36.9 C)] 98.1 F (36.7 C) (08/23 1429) Pulse Rate:  [43-67] 58  (08/23 1429) Resp:  [16-18] 18  (08/23 1429) BP: (69-107)/(36-66) 95/56 mmHg (08/23 1429) SpO2:  [90 %-97 %] 92 % (08/23 1429) Weight:  [103 lb 6.3 oz (46.9 kg)-104 lb 0.9 oz (47.2 kg)] 103 lb 6.3 oz (46.9 kg) 09-23-22 2051)  Intake/Output from previous day: 09-23-22 0701 - 08/23 0700 In: 0  Out: -140 [Urine:1; Stool:1] Intake/Output this shift: Total I/O In: 480 [P.O.:480] Out: -   2+ right brachial pulse and 2+ right radial pulse. Old scarring from removal of an old right upper arm AV Gore-Tex graft  Lab Results:  Basename 08/29/11 0607 09-23-2011 0600  WBC 3.5* 4.7  HGB 10.1* 9.6*  HCT 33.0* 31.5*  PLT 124* 138*   BMET  Basename 08/29/11 0607 09-23-11 0600  NA 135 134*  K 4.3 4.7  CL 100 99  CO2 26 28  GLUCOSE 79 75  BUN 8 17  CREATININE 3.03* 4.21*  CALCIUM 8.9 8.9    Studies/Results: Ir Veno/ext/bi  2011-09-23  *RADIOLOGY REPORT*  Clinical Data: End-stage renal disease, venous access planning for new access, current access is a femoral tunneled catheter  ULTRASOUND GUIDANCE VASCULAR ACCESS BILATERAL UPPER EXTREMITY AND CENTRAL VENOGRAMS  Date:  09-23-2011 09:50:00  Radiologist:  M. Daryll Brod, M.D.  Medications:  1% lidocaine locally  Guidance:  Ultrasound fluoroscopic  Fluoroscopy time:  0.8 minutes  Sedation time:  None.  Contrast volume:  40 ml [id number]  Complications:  No immediate  PROCEDURE/FINDINGS:  Informed consent was obtained from the patient following explanation of the procedure, risks, benefits and alternatives. The patient understands, agrees and consents for the procedure. All questions were addressed.  A time out was performed.  Maximal barrier sterile technique utilized including caps, mask, sterile gowns, sterile gloves, large sterile drape, hand hygiene, and Chloroprep   Under sterile conditions and local anesthesia, micropuncture access was performed with ultrasound of the left upper extremity brachial vein above the elbow.  Guide wire was advanced followed by the 4- [place] dilator.  Dilator remains in place for contrast injection to perform venography.  In a similar fashion, the right brachial vein was accessed with a micropuncture needle utilizing direct ultrasound.  This was also done under sterile conditions.  Guide wire advanced followed by a micro dilator.  Dilator remains for contrast injection for a right upper extremity venogram  Left upper extremity:  Small caliber recanalized brachial veins evident in the upper arm.  Axillary vein is patent more centrally. Within the chest, the subclavian vein is patent with a focal stenosis at the distal clavicle and first rib.  Contrast eventually passes through this area to opacified a small caliber narrowed left innominate vein.  Jugular paraspinal collaterals noted.  SVC patent.  Right upper extremity:  Right brachial and axillary veins are patent peripherally.  Right subclavian vein also demonstrates a focal stenosis at the distal clavicle first rib however this is not significantly flow limiting as contrast easily passes through into a patent right innominate vein and SVC.  IMPRESSION: Upper extremity venous anatomy as described.  The right upper extremity peripheral and central veins appear more favorable for dialysis access than the left side.   Original Report Authenticated By: Jerilynn Mages. Daryll Brod, M.D.    Ir US Guide Vasc Access Left  09-23-2011  *RADIOLOGY REPORT*  Clinical  Data: End-stage renal disease, venous access planning for new access, current access is a femoral tunneled catheter  ULTRASOUND GUIDANCE VASCULAR ACCESS BILATERAL UPPER EXTREMITY AND CENTRAL VENOGRAMS  Date:  08/28/2011 09:50:00  Radiologist:  M. Daryll Brod, M.D.  Medications:  1% lidocaine locally  Guidance:  Ultrasound fluoroscopic  Fluoroscopy time:   0.8 minutes  Sedation time:  None.  Contrast volume:  40 ml [id number]  Complications:  No immediate  PROCEDURE/FINDINGS:  Informed consent was obtained from the patient following explanation of the procedure, risks, benefits and alternatives. The patient understands, agrees and consents for the procedure. All questions were addressed.  A time out was performed.  Maximal barrier sterile technique utilized including caps, mask, sterile gowns, sterile gloves, large sterile drape, hand hygiene, and Chloroprep  Under sterile conditions and local anesthesia, micropuncture access was performed with ultrasound of the left upper extremity brachial vein above the elbow.  Guide wire was advanced followed by the 4- [place] dilator.  Dilator remains in place for contrast injection to perform venography.  In a similar fashion, the right brachial vein was accessed with a micropuncture needle utilizing direct ultrasound.  This was also done under sterile conditions.  Guide wire advanced followed by a micro dilator.  Dilator remains for contrast injection for a right upper extremity venogram  Left upper extremity:  Small caliber recanalized brachial veins evident in the upper arm.  Axillary vein is patent more centrally. Within the chest, the subclavian vein is patent with a focal stenosis at the distal clavicle and first rib.  Contrast eventually passes through this area to opacified a small caliber narrowed left innominate vein.  Jugular paraspinal collaterals noted.  SVC patent.  Right upper extremity:  Right brachial and axillary veins are patent peripherally.  Right subclavian vein also demonstrates a focal stenosis at the distal clavicle first rib however this is not significantly flow limiting as contrast easily passes through into a patent right innominate vein and SVC.  IMPRESSION: Upper extremity venous anatomy as described.  The right upper extremity peripheral and central veins appear more favorable for dialysis  access than the left side.   Original Report Authenticated By: Jerilynn Mages. Daryll Brod, M.D.    Ir US Guide Vasc Access Right  08/28/2011  *RADIOLOGY REPORT*  Clinical Data: End-stage renal disease, venous access planning for new access, current access is a femoral tunneled catheter  ULTRASOUND GUIDANCE VASCULAR ACCESS BILATERAL UPPER EXTREMITY AND CENTRAL VENOGRAMS  Date:  08/28/2011 09:50:00  Radiologist:  M. Daryll Brod, M.D.  Medications:  1% lidocaine locally  Guidance:  Ultrasound fluoroscopic  Fluoroscopy time:  0.8 minutes  Sedation time:  None.  Contrast volume:  40 ml [id number]  Complications:  No immediate  PROCEDURE/FINDINGS:  Informed consent was obtained from the patient following explanation of the procedure, risks, benefits and alternatives. The patient understands, agrees and consents for the procedure. All questions were addressed.  A time out was performed.  Maximal barrier sterile technique utilized including caps, mask, sterile gowns, sterile gloves, large sterile drape, hand hygiene, and Chloroprep  Under sterile conditions and local anesthesia, micropuncture access was performed with ultrasound of the left upper extremity brachial vein above the elbow.  Guide wire was advanced followed by the 4- [place] dilator.  Dilator remains in place for contrast injection to perform venography.  In a similar fashion, the right brachial vein was accessed with a micropuncture needle utilizing direct ultrasound.  This was also done under sterile conditions.  Guide wire advanced followed  by a micro dilator.  Dilator remains for contrast injection for a right upper extremity venogram  Left upper extremity:  Small caliber recanalized brachial veins evident in the upper arm.  Axillary vein is patent more centrally. Within the chest, the subclavian vein is patent with a focal stenosis at the distal clavicle and first rib.  Contrast eventually passes through this area to opacified a small caliber narrowed left  innominate vein.  Jugular paraspinal collaterals noted.  SVC patent.  Right upper extremity:  Right brachial and axillary veins are patent peripherally.  Right subclavian vein also demonstrates a focal stenosis at the distal clavicle first rib however this is not significantly flow limiting as contrast easily passes through into a patent right innominate vein and SVC.  IMPRESSION: Upper extremity venous anatomy as described.  The right upper extremity peripheral and central veins appear more favorable for dialysis access than the left side.   Original Report Authenticated By: Jerilynn Mages. Daryll Brod, M.D.    Dg Chest Port 1 View  08/26/2011  *RADIOLOGY REPORT*  Clinical Data: Right femoral Diatek catheter placement  PORTABLE CHEST - 1 VIEW  Comparison: 07/11/2011  Findings: Femoral catheter has been placed with tips in the right atrium and SVC.  Right jugular catheter has been removed since the prior study.  Cardiac enlargement.  Normal vascularity.  No edema or effusion.  IMPRESSION: Femoral catheter tips in the right atrium and SVC.  Lungs are clear.   Original Report Authenticated By: Truett Perna, M.D.    Dg Abd Acute W/chest  08/22/2011  *RADIOLOGY REPORT*  Clinical Data: Abdominal pain  ACUTE ABDOMEN SERIES (ABDOMEN 2 VIEW & CHEST 1 VIEW)  Comparison: 07/11/2011 and earlier studies  Findings: Right IJ tunneled hemodialysis catheter extends to the proximal right atrium.  Stable mild cardiomegaly.  Vascular clips in the left axilla.  Lungs are clear.  No effusion.  No free air. Nonobstructive bowel gas pattern.  Left pelvic phlebolith. Regional bones unremarkable.  IMPRESSION:  1.  Stable mild cardiomegaly. 2.  Normal bowel gas pattern.  No free air.  Original Report Authenticated By: Trecia Rogers, M.D.   Anti-infectives: Anti-infectives     Start     Dose/Rate Route Frequency Ordered Stop   08/30/11 2200   DAPTOmycin (CUBICIN) 282 mg in sodium chloride 0.9 % IVPB        6 mg/kg  47 kg  (Order-Specific) 211.3 mL/hr over 30 Minutes Intravenous Once per day on Tue Thu Sat 08/28/11 1251     08/28/11 2200   DAPTOmycin (CUBICIN) 282 mg in sodium chloride 0.9 % IVPB  Status:  Discontinued        6 mg/kg  47 kg (Order-Specific) 211.3 mL/hr over 30 Minutes Intravenous Once per day on Tue Thu Sat 08/28/11 1250 08/28/11 1251   08/28/11 2200   DAPTOmycin (CUBICIN) 282 mg in sodium chloride 0.9 % IVPB        6 mg/kg  47 kg (Order-Specific) 211.3 mL/hr over 30 Minutes Intravenous  Once 08/28/11 1254 08/28/11 2245   08/28/11 1400   DAPTOmycin (CUBICIN) 282 mg in sodium chloride 0.9 % IVPB  Status:  Discontinued        6 mg/kg  47 kg (Order-Specific) 211.3 mL/hr over 30 Minutes Intravenous  Once 08/28/11 1250 08/28/11 1254   08/27/11 1000   stavudine (ZERIT) capsule 15 mg        15 mg Oral Daily 08/26/11 1308     08/26/11 0600  cefUROXime (ZINACEF) 1.5 g in dextrose 5 % 50 mL IVPB        1.5 g 100 mL/hr over 30 Minutes Intravenous On call to O.R. 08/25/11 1453 08/26/11 1429   08/24/11 1500   linezolid (ZYVOX) IVPB 600 mg  Status:  Discontinued        600 mg 300 mL/hr over 60 Minutes Intravenous Every 12 hours 08/24/11 1418 08/28/11 1135   08/23/11 1200   vancomycin (VANCOCIN) 500 mg in sodium chloride 0.9 % 100 mL IVPB  Status:  Discontinued        500 mg 100 mL/hr over 60 Minutes Intravenous Every T-Th-Sa (Hemodialysis) 08/22/11 2326 08/24/11 1418   08/22/11 2359   cefTRIAXone (ROCEPHIN) 1 g in dextrose 5 % 50 mL IVPB  Status:  Discontinued        1 g 100 mL/hr over 30 Minutes Intravenous Every 24 hours 08/22/11 2326 08/24/11 1418   08/22/11 2330   vancomycin (VANCOCIN) IVPB 1000 mg/200 mL premix        1,000 mg 200 mL/hr over 60 Minutes Intravenous NOW 08/22/11 2326 08/23/11 0050   08/22/11 1530   vancomycin (VANCOCIN) 50 mg/mL oral solution 125 mg        125 mg Oral 4 times daily 08/22/11 1519 09/05/11 1359   08/22/11 1000   lamiVUDine (EPIVIR) 10 MG/ML solution 50  mg        50 mg Oral Daily 08/22/11 0509     08/22/11 1000   Stavudine SOLR 20 mg  Status:  Discontinued        20 mL Oral Daily 08/22/11 0509 08/22/11 0520   08/22/11 1000   stavudine (ZERIT) capsule 20 mg  Status:  Discontinued        20 mg Oral Daily 08/22/11 0521 08/26/11 1308   08/22/11 0800   Darunavir Ethanolate (PREZISTA) tablet 800 mg        800 mg Oral Daily with breakfast 08/22/11 0509     08/22/11 0800   ritonavir (NORVIR) tablet 100 mg        100 mg Oral Daily with breakfast 08/22/11 0509            Assessment/Plan: s/p Procedure(s) (LRB): TRANSESOPHAGEAL ECHOCARDIOGRAM (TEE) (N/A) Stage renal disease with need for new access. I reviewed her bilateral upper M.D. venograms. 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 On the right she does have a moderate stenosis at the the contrast that moves freely through this. I have recommended a right upper arm AV graft. She could have angioplasty of her venous narrowing in her subclavian should she have flow problems or high flow rates or swelling which I doubt we will schedule this for next week assuming resolution of her sepsis   LOS: 8 days   Whitlee Sluder 08/29/2011, 3:29 PM

## 2011-08-30 ENCOUNTER — Inpatient Hospital Stay (HOSPITAL_COMMUNITY): Payer: Medicaid Other

## 2011-08-30 LAB — CBC WITH DIFFERENTIAL/PLATELET
Basophils Absolute: 0 10*3/uL (ref 0.0–0.1)
Basophils Relative: 1 % (ref 0–1)
Eosinophils Absolute: 0.2 10*3/uL (ref 0.0–0.7)
Eosinophils Relative: 4 % (ref 0–5)
HCT: 30.7 % — ABNORMAL LOW (ref 36.0–46.0)
Hemoglobin: 9.2 g/dL — ABNORMAL LOW (ref 12.0–15.0)
Lymphocytes Relative: 29 % (ref 12–46)
Lymphs Abs: 1.1 10*3/uL (ref 0.7–4.0)
MCH: 30.9 pg (ref 26.0–34.0)
MCHC: 30 g/dL (ref 30.0–36.0)
MCV: 103 fL — ABNORMAL HIGH (ref 78.0–100.0)
Monocytes Absolute: 0.5 10*3/uL (ref 0.1–1.0)
Monocytes Relative: 12 % (ref 3–12)
Neutro Abs: 2.2 10*3/uL (ref 1.7–7.7)
Neutrophils Relative %: 55 % (ref 43–77)
Platelets: 114 10*3/uL — ABNORMAL LOW (ref 150–400)
RBC: 2.98 MIL/uL — ABNORMAL LOW (ref 3.87–5.11)
RDW: 16.7 % — ABNORMAL HIGH (ref 11.5–15.5)
WBC: 4 10*3/uL (ref 4.0–10.5)

## 2011-08-30 LAB — RENAL FUNCTION PANEL
Albumin: 2.2 g/dL — ABNORMAL LOW (ref 3.5–5.2)
BUN: 16 mg/dL (ref 6–23)
CO2: 29 mEq/L (ref 19–32)
Calcium: 9.1 mg/dL (ref 8.4–10.5)
Chloride: 101 mEq/L (ref 96–112)
Creatinine, Ser: 4.57 mg/dL — ABNORMAL HIGH (ref 0.50–1.10)
GFR calc Af Amer: 12 mL/min — ABNORMAL LOW (ref >90)
GFR calc non Af Amer: 10 mL/min — ABNORMAL LOW (ref >90)
Glucose, Bld: 89 mg/dL (ref 70–99)
Phosphorus: 3.1 mg/dL (ref 2.3–4.6)
Potassium: 4.5 mEq/L (ref 3.5–5.1)
Sodium: 137 mEq/L (ref 135–145)

## 2011-08-30 LAB — CULTURE, BLOOD (ROUTINE X 2)

## 2011-08-30 MED ORDER — PARICALCITOL 5 MCG/ML IV SOLN
INTRAVENOUS | Status: AC
  Administered 2011-08-30: 4 ug via INTRAVENOUS
  Filled 2011-08-30: qty 1

## 2011-08-30 MED ORDER — DARBEPOETIN ALFA-POLYSORBATE 100 MCG/0.5ML IJ SOLN
INTRAMUSCULAR | Status: AC
  Administered 2011-08-30: 100 ug via INTRAVENOUS
  Filled 2011-08-30: qty 0.5

## 2011-08-30 NOTE — Progress Notes (Signed)
Subjective: Pt still reports diarrhea otherwise denies ab pain, chest pain. Pt reports feeling fine and likes new diet change.    Objective: Vital signs in last 24 hours: Filed Vitals:   08/30/11 1030 08/30/11 1045 08/30/11 1114 08/30/11 1136  BP: 95/61 92/55 109/67 76/51  Pulse: 55 55 50 72  Temp:   97.8 F (36.6 C) 97.9 F (36.6 C)  TempSrc:   Oral Oral  Resp: 17 19 17 18   Height:      Weight:   119 lb 14.9 oz (54.4 kg)   SpO2:   93% 94%   Weight change: -15 lb 6.9 oz (-7 kg)  Intake/Output Summary (Last 24 hours) at 08/30/11 1138 Last data filed at 08/30/11 1114  Gross per 24 hour  Intake    240 ml  Output   1244 ml  Net  -1004 ml   Vitals reviewed. General: resting in HD, NAD, cachetic  HEENT: no scleral icterus Cardiac: +systolic murmur Pulm: clear to auscultation bilaterally, no wheezes, rales, or rhonchi Abd: soft, nontender, nondistended, BS present (normal)  Ext: warm and well perfused, no pedal edema, hyperpigmentation to b/l lower ext Neuro: alert and oriented X3  Lab Results: Basic Metabolic Panel:  Lab [id number] 0730 08/29/11 0607  NA 137 135  K 4.5 4.3  CL 101 100  CO2 29 26  GLUCOSE 89 79  BUN 16 8  CREATININE 4.57* 3.03*  CALCIUM 9.1 8.9  MG -- --  PHOS 3.1 2.6   Liver Function Tests:  Lab 08/30/11 0730 08/29/11 0607  AST -- --  ALT -- --  ALKPHOS -- --  BILITOT -- --  PROT -- --  ALBUMIN 2.2* 2.3*   CBC:  Lab 08/30/11 0730 08/29/11 0607  WBC 4.0 3.5*  NEUTROABS 2.2 2.3  HGB 9.2* 10.1*  HCT 30.7* 33.0*  MCV 103.0* 101.5*  PLT 114* 124*   CBG:  Lab 08/26/11 1137 08/23/11 1423 08/23/11 1341  GLUCAP 85 97 63*     Misc. Labs: no recent  Micro Results: Recent Results (from the past 240 hour(s))  CULTURE, BLOOD (ROUTINE X 2)     Status: Normal   Collection Time   08/21/11 11:35 PM      Component Value Range Status Comment   Specimen Description BLOOD LEFT ARM   Final    Special Requests BOTTLES DRAWN AEROBIC AND ANAEROBIC  5ML   Final    Culture  Setup Time 08/22/2011 04:58   Final    Culture     Final    Value: VANCOMYCIN RESISTANT ENTEROCOCCUS ISOLATED     4 Note: DAPTOMYCIN S CRITICAL RESULT CALLED TO, READ BACK BY AND VERIFIED WITH: ASHLEY@ 1025 08/25/11 BY KRAWS     Note: Gram Stain Report Called to,Read Back By and Verified With: KIMBERLY PORTER @ 2150 ON 08/22/11 BY GOLLD   Report Status 08/30/2011 FINAL   Final    Organism ID, Bacteria VANCOMYCIN RESISTANT ENTEROCOCCUS ISOLATED   Final   CULTURE, BLOOD (ROUTINE X 2)     Status: Normal   Collection Time   08/21/11 11:59 PM      Component Value Range Status Comment   Specimen Description BLOOD LEFT HAND   Final    Special Requests BOTTLES DRAWN AEROBIC AND ANAEROBIC 5CC   Final    Culture  Setup Time 08/22/2011 04:59   Final    Culture     Final    Value: ENTEROCOCCUS SPECIES     Note: SUSCEPTIBILITIES PERFORMED  ON PREVIOUS CULTURE WITHIN THE LAST 5 DAYS.     Note: Gram Stain Report Called to,Read Back By and Verified With: KIMBERLY PORTER @ 2150 ON 08/22/11 BY GOLLD   Report Status 08/25/2011 FINAL   Final   MRSA PCR SCREENING     Status: Normal   Collection Time   08/22/11  5:24 AM      Component Value Range Status Comment   MRSA by PCR NEGATIVE  NEGATIVE Final   CLOSTRIDIUM DIFFICILE BY PCR     Status: Abnormal   Collection Time   08/22/11 10:48 AM      Component Value Range Status Comment   C difficile by pcr POSITIVE (*) NEGATIVE Final   CULTURE, BLOOD (ROUTINE X 2)     Status: Normal   Collection Time   08/22/11 11:15 PM      Component Value Range Status Comment   Specimen Description BLOOD RIGHT ARM   Final    Special Requests BOTTLES DRAWN AEROBIC AND ANAEROBIC 10CC   Final    Culture  Setup Time 08/23/2011 05:11   Final    Culture     Final    Value: ENTEROCOCCUS SPECIES     Note: SUSCEPTIBILITIES PERFORMED ON PREVIOUS CULTURE WITHIN THE LAST 5 DAYS.     Note: Gram Stain Report Called to,Read Back By and Verified With: RN K. GENGLER ON  08/23/11 AT 2145 BY DTERRY   Report Status 08/25/2011 FINAL   Final   CULTURE, BLOOD (ROUTINE X 2)     Status: Normal   Collection Time   08/22/11 11:20 PM      Component Value Range Status Comment   Specimen Description BLOOD RIGHT ARM   Final    Special Requests BOTTLES DRAWN AEROBIC AND ANAEROBIC 10CC   Final    Culture  Setup Time 08/23/2011 05:11   Final    Culture     Final    Value: ENTEROCOCCUS SPECIES     Note: SUSCEPTIBILITIES PERFORMED ON PREVIOUS CULTURE WITHIN THE LAST 5 DAYS.     Note: Gram Stain Report Called to,Read Back By and Verified With: RN K. GENGLER ON 08/23/11 AT 2145 BY DTERRY   Report Status 08/25/2011 FINAL   Final   CATH TIP CULTURE     Status: Normal   Collection Time   08/23/11  2:19 PM      Component Value Range Status Comment   Specimen Description CATH TIP   Final    Special Requests NONE   Final    Culture     Final    Value: 80 COLONIES VANCOMYCIN RESISTANT ENTEROCOCCUS ISOLATED     Note: RVB TINA O 08/27/11@830  BY REAMM   Report Status 08/27/2011 FINAL   Final    Organism ID, Bacteria VANCOMYCIN RESISTANT ENTEROCOCCUS ISOLATED   Final   CULTURE, BLOOD (ROUTINE X 2)     Status: Normal (Preliminary result)   Collection Time   08/26/11  7:55 AM      Component Value Range Status Comment   Specimen Description BLOOD HAND LEFT   Final    Special Requests BOTTLES DRAWN AEROBIC ONLY 10CC   Final    Culture  Setup Time 08/26/2011 15:00   Final    Culture     Final    Value:        BLOOD CULTURE RECEIVED NO GROWTH TO DATE CULTURE WILL BE HELD FOR 5 DAYS BEFORE ISSUING A FINAL NEGATIVE REPORT   Report Status PENDING  Incomplete   CULTURE, BLOOD (ROUTINE X 2)     Status: Normal (Preliminary result)   Collection Time   08/26/11  8:05 AM      Component Value Range Status Comment   Specimen Description BLOOD LEFT FOREARM   Final    Special Requests BOTTLES DRAWN AEROBIC AND ANAEROBIC 10CC EACH   Final    Culture  Setup Time 08/26/2011 15:01   Final    Culture      Final    Value:        BLOOD CULTURE RECEIVED NO GROWTH TO DATE CULTURE WILL BE HELD FOR 5 DAYS BEFORE ISSUING A FINAL NEGATIVE REPORT   Report Status PENDING   Incomplete    Studies/Results: Ir Veno/ext/bi  23-Sep-2011  *RADIOLOGY REPORT*  Clinical Data: End-stage renal disease, venous access planning for new access, current access is a femoral tunneled catheter  ULTRASOUND GUIDANCE VASCULAR ACCESS BILATERAL UPPER EXTREMITY AND CENTRAL VENOGRAMS  Date:  2011/09/23 09:50:00  Radiologist:  M. Daryll Brod, M.D.  Medications:  1% lidocaine locally  Guidance:  Ultrasound fluoroscopic  Fluoroscopy time:  0.8 minutes  Sedation time:  None.  Contrast volume:  40 ml [id number]  Complications:  No immediate  PROCEDURE/FINDINGS:  Informed consent was obtained from the patient following explanation of the procedure, risks, benefits and alternatives. The patient understands, agrees and consents for the procedure. All questions were addressed.  A time out was performed.  Maximal barrier sterile technique utilized including caps, mask, sterile gowns, sterile gloves, large sterile drape, hand hygiene, and Chloroprep  Under sterile conditions and local anesthesia, micropuncture access was performed with ultrasound of the left upper extremity brachial vein above the elbow.  Guide wire was advanced followed by the 4- [place] dilator.  Dilator remains in place for contrast injection to perform venography.  In a similar fashion, the right brachial vein was accessed with a micropuncture needle utilizing direct ultrasound.  This was also done under sterile conditions.  Guide wire advanced followed by a micro dilator.  Dilator remains for contrast injection for a right upper extremity venogram  Left upper extremity:  Small caliber recanalized brachial veins evident in the upper arm.  Axillary vein is patent more centrally. Within the chest, the subclavian vein is patent with a focal stenosis at the distal clavicle and first rib.   Contrast eventually passes through this area to opacified a small caliber narrowed left innominate vein.  Jugular paraspinal collaterals noted.  SVC patent.  Right upper extremity:  Right brachial and axillary veins are patent peripherally.  Right subclavian vein also demonstrates a focal stenosis at the distal clavicle first rib however this is not significantly flow limiting as contrast easily passes through into a patent right innominate vein and SVC.  IMPRESSION: Upper extremity venous anatomy as described.  The right upper extremity peripheral and central veins appear more favorable for dialysis access than the left side.   Original Report Authenticated By: Jerilynn Mages. Daryll Brod, M.D.    Ir US Guide Vasc Access Left  09-23-2011  *RADIOLOGY REPORT*  Clinical Data: End-stage renal disease, venous access planning for new access, current access is a femoral tunneled catheter  ULTRASOUND GUIDANCE VASCULAR ACCESS BILATERAL UPPER EXTREMITY AND CENTRAL VENOGRAMS  Date:  09/23/2011 09:50:00  Radiologist:  M. Daryll Brod, M.D.  Medications:  1% lidocaine locally  Guidance:  Ultrasound fluoroscopic  Fluoroscopy time:  0.8 minutes  Sedation time:  None.  Contrast volume:  40 ml [id number]  Complications:  No immediate  PROCEDURE/FINDINGS:  Informed consent was obtained from the patient following explanation of the procedure, risks, benefits and alternatives. The patient understands, agrees and consents for the procedure. All questions were addressed.  A time out was performed.  Maximal barrier sterile technique utilized including caps, mask, sterile gowns, sterile gloves, large sterile drape, hand hygiene, and Chloroprep  Under sterile conditions and local anesthesia, micropuncture access was performed with ultrasound of the left upper extremity brachial vein above the elbow.  Guide wire was advanced followed by the 4- [place] dilator.  Dilator remains in place for contrast injection to perform venography.  In a similar  fashion, the right brachial vein was accessed with a micropuncture needle utilizing direct ultrasound.  This was also done under sterile conditions.  Guide wire advanced followed by a micro dilator.  Dilator remains for contrast injection for a right upper extremity venogram  Left upper extremity:  Small caliber recanalized brachial veins evident in the upper arm.  Axillary vein is patent more centrally. Within the chest, the subclavian vein is patent with a focal stenosis at the distal clavicle and first rib.  Contrast eventually passes through this area to opacified a small caliber narrowed left innominate vein.  Jugular paraspinal collaterals noted.  SVC patent.  Right upper extremity:  Right brachial and axillary veins are patent peripherally.  Right subclavian vein also demonstrates a focal stenosis at the distal clavicle first rib however this is not significantly flow limiting as contrast easily passes through into a patent right innominate vein and SVC.  IMPRESSION: Upper extremity venous anatomy as described.  The right upper extremity peripheral and central veins appear more favorable for dialysis access than the left side.   Original Report Authenticated By: Jerilynn Mages. Daryll Brod, M.D.    Ir US Guide Vasc Access Right  08/28/2011  *RADIOLOGY REPORT*  Clinical Data: End-stage renal disease, venous access planning for new access, current access is a femoral tunneled catheter  ULTRASOUND GUIDANCE VASCULAR ACCESS BILATERAL UPPER EXTREMITY AND CENTRAL VENOGRAMS  Date:  08/28/2011 09:50:00  Radiologist:  M. Daryll Brod, M.D.  Medications:  1% lidocaine locally  Guidance:  Ultrasound fluoroscopic  Fluoroscopy time:  0.8 minutes  Sedation time:  None.  Contrast volume:  40 ml [id number]  Complications:  No immediate  PROCEDURE/FINDINGS:  Informed consent was obtained from the patient following explanation of the procedure, risks, benefits and alternatives. The patient understands, agrees and consents for the  procedure. All questions were addressed.  A time out was performed.  Maximal barrier sterile technique utilized including caps, mask, sterile gowns, sterile gloves, large sterile drape, hand hygiene, and Chloroprep  Under sterile conditions and local anesthesia, micropuncture access was performed with ultrasound of the left upper extremity brachial vein above the elbow.  Guide wire was advanced followed by the 4- [place] dilator.  Dilator remains in place for contrast injection to perform venography.  In a similar fashion, the right brachial vein was accessed with a micropuncture needle utilizing direct ultrasound.  This was also done under sterile conditions.  Guide wire advanced followed by a micro dilator.  Dilator remains for contrast injection for a right upper extremity venogram  Left upper extremity:  Small caliber recanalized brachial veins evident in the upper arm.  Axillary vein is patent more centrally. Within the chest, the subclavian vein is patent with a focal stenosis at the distal clavicle and first rib.  Contrast eventually passes through this area to opacified a small caliber narrowed left innominate vein.  Jugular  paraspinal collaterals noted.  SVC patent.  Right upper extremity:  Right brachial and axillary veins are patent peripherally.  Right subclavian vein also demonstrates a focal stenosis at the distal clavicle first rib however this is not significantly flow limiting as contrast easily passes through into a patent right innominate vein and SVC.  IMPRESSION: Upper extremity venous anatomy as described.  The right upper extremity peripheral and central veins appear more favorable for dialysis access than the left side.   Original Report Authenticated By: Jerilynn Mages. Daryll Brod, M.D.     TTE 8/17 shows EF 50-55%, mild LVH, grade 1 diastolic dysfunction, mild AR, trivial pericardial effusion ant. Heart   TEE 8/21 mod hypertrophied LV; EF 35-40%; global hypokinesis and inf/inferiorlateral  akinesis no thrombus/mass; mild thick MV; small calcified mobile mass on chordae tendinae possibly potentially ruptured cord or old organized thrombus; trace mild MR; aortic valve with trace central regurg   Medications:  Scheduled Meds:    . aspirin  81 mg Oral Daily  . DAPTOmycin (CUBICIN)  IV  6 mg/kg (Order-Specific) Intravenous Custom  . darbepoetin  100 mcg Intravenous Q Sat-HD  . darunavir  800 mg Oral Q breakfast  . feeding supplement  237 mL Oral BID BM  . lamiVUDine  50 mg Oral Daily  . midodrine  10 mg Oral BID WC  . multivitamin  1 tablet Oral QHS  . pantoprazole  20 mg Oral Q1200  . paricalcitol  4 mcg Intravenous Q T,Th,Sa-HD  . ritonavir  100 mg Oral Q breakfast  . sodium chloride  3 mL Intravenous Q12H  . stavudine  15 mg Oral Daily  . 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 Diff (07/2011), MGUS, h/o  anemia, h/o pancytopenia/thrombocytopenia with EF 40-45% prior to admission.  She presented 8/16 with fever and ab pain.    1. Bacteremia -4 blood Cxs aerobic/ anaerobic +VRE and previous cath tip +enterococcus 80K colonies  -old HD cath removed 8/17 d/t HD related bacteremia  Plan -pending repeat blood cultures NTD will follow  -cont Daptomycin (8/22) dosed q48 with HD  -ID consulted and following-pt has outpt f/u with Dr. Tommy Medal 09/11/11; pt may need abx duration of 2 wks to 8 wks but Dr. Tommy Medal will determine in future  -renal and VVS following pt   2. C.diff -still with diarrhea  -isolation precautions and protocol -Rx oral  Vancomycin 125 mg qid (since 8/16) will tx for duration of abx therapy for bacteremia plus 2 weeks after discontinue abx for bacteremia   3. Bradycardia  -pt is asymptomatic, HR 52 this am but still ranging 40s-70s -will monitor, NTD currently   4. Ischemic and Nonischemic cardiomyopathy -EF 35-40% -medical management: avoid BB d/t bradycardia but can try ACEI or ARB when bp can tolerate -cards consulted they also rec vasodilator tx if bp improves -will schedule outpt cards f/u  5. History of hypotension -BP improving 113/65 this am  -cont hold home Lopressor  -Cont Midodrine 10 mg bid  -will monitor bp -careful with IVF boluses 2/2 #4  6. HIV INFECTION  -repeat cd 4 ct 8/20 was 310; HIV genotype pending -home meds (  Norvir, Epivir, Prezista, Zerit) -no PCP px with Bactrim 3x per week per ID Dr. Tommy Medal as of now -ID following and pt will have outpt ID f/u 09/11/11  7. ESRD (end stage renal disease) (03/19/2007) -renal consulted and following -Cr. baseline 3-4 range  -HD access chest pulled after dialysis 8/17 d/t bacteremia -new diatek cath placed right fem vein on 8/20 by VVS -HD TTHSat  -venography 8/22 showed right upper ext as possible favorable new access site -VVS planning new RUE AVgraft w/in the next 2 weeks once blood cxs negative    8. ANEMIA OF CHRONIC DISEASE -Aransep   9. H/o Pancytopenia/Thrombocytopenia  -chronic pancytopenia, thrombocytopenia noted 8/15 with plts 146 -pt has a h/o chronic thrombocytopenia as well trending plts which are 114 this am -will monitor cbc   10. DVT/GIPx -scds -protonix   11. F/E/N -renal following electrolytes -diet ordered   12. Dispo -d/c pending w/u was possibly Monday to [facility] but [facility]VS considering right upper ext AV graft once pt has negative blood cx's w/in the next couple of weeks -social work working on [employer] and [facility] (new HD center) will make her aware on Monday -may try d/c pt Monday with  readmission for AV graft   Code status: full   LOS: 9 days   Cresenciano Genre [id number] 08/30/2011, 11:38 AM

## 2011-08-30 NOTE — Progress Notes (Signed)
Subjective: Interval History: has complaints Dx4 . Strength getting better.  Objective: Vital signs in last 24 hours: Temp:  [97.8 F (36.6 C)-98.2 F (36.8 C)] 98.2 F (36.8 C) (08/24 0646) Pulse Rate:  [45-73] 52  (08/24 0730) Resp:  [16-18] 16  (08/24 0730) BP: (95-156)/(56-78) 113/65 mmHg (08/24 0730) SpO2:  [92 %-100 %] 96 % (08/24 0646) Weight:  [48.6 kg (107 lb 2.3 oz)-55.9 kg (123 lb 3.8 oz)] 55.9 kg (123 lb 3.8 oz) (08/24 0646) Weight change: -7 kg (-15 lb 6.9 oz)  Intake/Output from previous day: 08/23 0701 - 08/24 0700 In: 480 [P.O.:480] Out: -  Intake/Output this shift:    General appearance: alert, cooperative and cachectic Resp: diminished breath sounds bilaterally Cardio: S1, S2 normal and systolic murmur: holosystolic 3/6, blowing at apex GI: pos bs, soft, liver down 4 cm Extremities: R fem cath  Lab Results:  Basename 08/29/11 0607 September 13, 2011 0600  WBC 3.5* 4.7  HGB 10.1* 9.6*  HCT 33.0* 31.5*  PLT 124* 138*   BMET:  Basename 08/29/11 0607 09-13-11 0600  NA 135 134*  K 4.3 4.7  CL 100 99  CO2 26 28  GLUCOSE 79 75  BUN 8 17  CREATININE 3.03* 4.21*  CALCIUM 8.9 8.9   No results found for this basename: PTH:2 in the last 72 hours Iron Studies: No results found for this basename: IRON,TIBC,TRANSFERRIN,FERRITIN in the last 72 hours  Studies/Results: Ir Veno/ext/bi  09/13/2011  *RADIOLOGY REPORT*  Clinical Data: End-stage renal disease, venous access planning for new access, current access is a femoral tunneled catheter  ULTRASOUND GUIDANCE VASCULAR ACCESS BILATERAL UPPER EXTREMITY AND CENTRAL VENOGRAMS  Date:  09/13/2011 09:50:00  Radiologist:  M. Daryll Brod, M.D.  Medications:  1% lidocaine locally  Guidance:  Ultrasound fluoroscopic  Fluoroscopy time:  0.8 minutes  Sedation time:  None.  Contrast volume:  40 ml [id number]  Complications:  No immediate  PROCEDURE/FINDINGS:  Informed consent was obtained from the patient following explanation of the  procedure, risks, benefits and alternatives. The patient understands, agrees and consents for the procedure. All questions were addressed.  A time out was performed.  Maximal barrier sterile technique utilized including caps, mask, sterile gowns, sterile gloves, large sterile drape, hand hygiene, and Chloroprep  Under sterile conditions and local anesthesia, micropuncture access was performed with ultrasound of the left upper extremity brachial vein above the elbow.  Guide wire was advanced followed by the 4- [place] dilator.  Dilator remains in place for contrast injection to perform venography.  In a similar fashion, the right brachial vein was accessed with a micropuncture needle utilizing direct ultrasound.  This was also done under sterile conditions.  Guide wire advanced followed by a micro dilator.  Dilator remains for contrast injection for a right upper extremity venogram  Left upper extremity:  Small caliber recanalized brachial veins evident in the upper arm.  Axillary vein is patent more centrally. Within the chest, the subclavian vein is patent with a focal stenosis at the distal clavicle and first rib.  Contrast eventually passes through this area to opacified a small caliber narrowed left innominate vein.  Jugular paraspinal collaterals noted.  SVC patent.  Right upper extremity:  Right brachial and axillary veins are patent peripherally.  Right subclavian vein also demonstrates a focal stenosis at the distal clavicle first rib however this is not significantly flow limiting as contrast easily passes through into a patent right innominate vein and SVC.  IMPRESSION: Upper extremity venous anatomy as described.  The right upper extremity peripheral and central veins appear more favorable for dialysis access than the left side.   Original Report Authenticated By: Jerilynn Mages. Daryll Brod, M.D.    Ir US Guide Vasc Access Left  08/28/2011  *RADIOLOGY REPORT*  Clinical Data: End-stage renal disease, venous access  planning for new access, current access is a femoral tunneled catheter  ULTRASOUND GUIDANCE VASCULAR ACCESS BILATERAL UPPER EXTREMITY AND CENTRAL VENOGRAMS  Date:  08/28/2011 09:50:00  Radiologist:  M. Daryll Brod, M.D.  Medications:  1% lidocaine locally  Guidance:  Ultrasound fluoroscopic  Fluoroscopy time:  0.8 minutes  Sedation time:  None.  Contrast volume:  40 ml [id number]  Complications:  No immediate  PROCEDURE/FINDINGS:  Informed consent was obtained from the patient following explanation of the procedure, risks, benefits and alternatives. The patient understands, agrees and consents for the procedure. All questions were addressed.  A time out was performed.  Maximal barrier sterile technique utilized including caps, mask, sterile gowns, sterile gloves, large sterile drape, hand hygiene, and Chloroprep  Under sterile conditions and local anesthesia, micropuncture access was performed with ultrasound of the left upper extremity brachial vein above the elbow.  Guide wire was advanced followed by the 4- [place] dilator.  Dilator remains in place for contrast injection to perform venography.  In a similar fashion, the right brachial vein was accessed with a micropuncture needle utilizing direct ultrasound.  This was also done under sterile conditions.  Guide wire advanced followed by a micro dilator.  Dilator remains for contrast injection for a right upper extremity venogram  Left upper extremity:  Small caliber recanalized brachial veins evident in the upper arm.  Axillary vein is patent more centrally. Within the chest, the subclavian vein is patent with a focal stenosis at the distal clavicle and first rib.  Contrast eventually passes through this area to opacified a small caliber narrowed left innominate vein.  Jugular paraspinal collaterals noted.  SVC patent.  Right upper extremity:  Right brachial and axillary veins are patent peripherally.  Right subclavian vein also demonstrates a focal stenosis at  the distal clavicle first rib however this is not significantly flow limiting as contrast easily passes through into a patent right innominate vein and SVC.  IMPRESSION: Upper extremity venous anatomy as described.  The right upper extremity peripheral and central veins appear more favorable for dialysis access than the left side.   Original Report Authenticated By: Jerilynn Mages. Daryll Brod, M.D.    Ir US Guide Vasc Access Right  08/28/2011  *RADIOLOGY REPORT*  Clinical Data: End-stage renal disease, venous access planning for new access, current access is a femoral tunneled catheter  ULTRASOUND GUIDANCE VASCULAR ACCESS BILATERAL UPPER EXTREMITY AND CENTRAL VENOGRAMS  Date:  08/28/2011 09:50:00  Radiologist:  M. Daryll Brod, M.D.  Medications:  1% lidocaine locally  Guidance:  Ultrasound fluoroscopic  Fluoroscopy time:  0.8 minutes  Sedation time:  None.  Contrast volume:  40 ml [id number]  Complications:  No immediate  PROCEDURE/FINDINGS:  Informed consent was obtained from the patient following explanation of the procedure, risks, benefits and alternatives. The patient understands, agrees and consents for the procedure. All questions were addressed.  A time out was performed.  Maximal barrier sterile technique utilized including caps, mask, sterile gowns, sterile gloves, large sterile drape, hand hygiene, and Chloroprep  Under sterile conditions and local anesthesia, micropuncture access was performed with ultrasound of the left upper extremity brachial vein above the elbow.  Guide wire was advanced followed by the 4-  [place] dilator.  Dilator remains in place for contrast injection to perform venography.  In a similar fashion, the right brachial vein was accessed with a micropuncture needle utilizing direct ultrasound.  This was also done under sterile conditions.  Guide wire advanced followed by a micro dilator.  Dilator remains for contrast injection for a right upper extremity venogram  Left upper extremity:   Small caliber recanalized brachial veins evident in the upper arm.  Axillary vein is patent more centrally. Within the chest, the subclavian vein is patent with a focal stenosis at the distal clavicle and first rib.  Contrast eventually passes through this area to opacified a small caliber narrowed left innominate vein.  Jugular paraspinal collaterals noted.  SVC patent.  Right upper extremity:  Right brachial and axillary veins are patent peripherally.  Right subclavian vein also demonstrates a focal stenosis at the distal clavicle first rib however this is not significantly flow limiting as contrast easily passes through into a patent right innominate vein and SVC.  IMPRESSION: Upper extremity venous anatomy as described.  The right upper extremity peripheral and central veins appear more favorable for dialysis access than the left side.   Original Report Authenticated By: Jerilynn Mages. Daryll Brod, M.D.     I have reviewed the patient's current medications.  Assessment/Plan: 1 CRF for HD, vol ok.  Can UE access 2 HIV meds 3 Anemia epo 4 VRE sepsis on Dapto 5 Cdiff resolving P HD epo, Dapto, access in future    LOS: 9 days   Ashayla Subia L 08/30/2011,7:40 AM

## 2011-08-30 NOTE — Procedures (Signed)
I was present at this session.  I have reviewed the session itself and made appropriate changes. HD via fem cath  [name] L 8/24/20137:36 AM

## 2011-08-31 ENCOUNTER — Encounter (HOSPITAL_COMMUNITY): Payer: Self-pay | Admitting: Anesthesiology

## 2011-08-31 NOTE — Progress Notes (Signed)
Subjective: Pt doing well except for diarrhea. Pt reports she is eating more food since diet change.  Family at bedside   Objective: Vital signs in last 24 hours: Filed Vitals:   08/30/11 2100 08/31/11 0450 08/31/11 0910 08/31/11 1334  BP: 113/69 122/63 97/72 96/72   Pulse: 64 59 84 72  Temp: 98.5 F (36.9 C) 99.2 F (37.3 C) 97.9 F (36.6 C) 97.6 F (36.4 C)  TempSrc: Oral Oral Oral Oral  Resp: 16 16 18 18   Height:      Weight: 111 lb 8.8 oz (50.6 kg)     SpO2: 95% 92% 94% 97%   Weight change: 12 lb 12.6 oz (5.8 kg)  Intake/Output Summary (Last 24 hours) at 08/31/11 1526 Last data filed at 08/31/11 0700  Gross per 24 hour  Intake 418.64 ml  Output      0 ml  Net 418.64 ml   Vitals reviewed. General: resting in HD, NAD, cachetic  HEENT: no scleral icterus Cardiac: +systolic murmur Pulm: clear to auscultation bilaterally, no wheezes, rales, or rhonchi Abd: soft, nontender, nondistended, BS present (normal)  Ext: warm and well perfused, no pedal edema, hyperpigmentation to b/l lower ext, scds intact  Neuro: alert and oriented X3  Lab Results: Basic Metabolic Panel:  Lab [id number] 0730 08/29/11 0607  NA 137 135  K 4.5 4.3  CL 101 100  CO2 29 26  GLUCOSE 89 79  BUN 16 8  CREATININE 4.57* 3.03*  CALCIUM 9.1 8.9  MG -- --  PHOS 3.1 2.6   Liver Function Tests:  Lab 08/30/11 0730 08/29/11 0607  AST -- --  ALT -- --  ALKPHOS -- --  BILITOT -- --  PROT -- --  ALBUMIN 2.2* 2.3*   CBC:  Lab 08/30/11 0730 08/29/11 0607  WBC 4.0 3.5*  NEUTROABS 2.2 2.3  HGB 9.2* 10.1*  HCT 30.7* 33.0*  MCV 103.0* 101.5*  PLT 114* 124*   CBG:  Lab 08/26/11 1137  GLUCAP 85     Misc. Labs: no recent  Micro Results: Recent Results (from the past 240 hour(s))  CULTURE, BLOOD (ROUTINE X 2)     Status: Normal   Collection Time   08/21/11 11:35 PM      Component Value Range Status Comment   Specimen Description BLOOD LEFT ARM   Final    Special Requests BOTTLES DRAWN  AEROBIC AND ANAEROBIC 5ML   Final    Culture  Setup Time 08/22/2011 04:58   Final    Culture     Final    Value: VANCOMYCIN RESISTANT ENTEROCOCCUS ISOLATED     4 Note: DAPTOMYCIN S CRITICAL RESULT CALLED TO, READ BACK BY AND VERIFIED WITH: ASHLEY@ 1025 08/25/11 BY KRAWS     Note: Gram Stain Report Called to,Read Back By and Verified With: KIMBERLY PORTER @ 2150 ON 08/22/11 BY GOLLD   Report Status 08/30/2011 FINAL   Final    Organism ID, Bacteria VANCOMYCIN RESISTANT ENTEROCOCCUS ISOLATED   Final   CULTURE, BLOOD (ROUTINE X 2)     Status: Normal   Collection Time   08/21/11 11:59 PM      Component Value Range Status Comment   Specimen Description BLOOD LEFT HAND   Final    Special Requests BOTTLES DRAWN AEROBIC AND ANAEROBIC 5CC   Final    Culture  Setup Time 08/22/2011 04:59   Final    Culture     Final    Value: ENTEROCOCCUS SPECIES     Note:  SUSCEPTIBILITIES PERFORMED ON PREVIOUS CULTURE WITHIN THE LAST 5 DAYS.     Note: Gram Stain Report Called to,Read Back By and Verified With: KIMBERLY PORTER @ 2150 ON 08/22/11 BY GOLLD   Report Status 08/25/2011 FINAL   Final   MRSA PCR SCREENING     Status: Normal   Collection Time   08/22/11  5:24 AM      Component Value Range Status Comment   MRSA by PCR NEGATIVE  NEGATIVE Final   CLOSTRIDIUM DIFFICILE BY PCR     Status: Abnormal   Collection Time   08/22/11 10:48 AM      Component Value Range Status Comment   C difficile by pcr POSITIVE (*) NEGATIVE Final   CULTURE, BLOOD (ROUTINE X 2)     Status: Normal   Collection Time   08/22/11 11:15 PM      Component Value Range Status Comment   Specimen Description BLOOD RIGHT ARM   Final    Special Requests BOTTLES DRAWN AEROBIC AND ANAEROBIC 10CC   Final    Culture  Setup Time 08/23/2011 05:11   Final    Culture     Final    Value: ENTEROCOCCUS SPECIES     Note: SUSCEPTIBILITIES PERFORMED ON PREVIOUS CULTURE WITHIN THE LAST 5 DAYS.     Note: Gram Stain Report Called to,Read Back By and Verified  With: RN K. GENGLER ON 08/23/11 AT 2145 BY DTERRY   Report Status 08/25/2011 FINAL   Final   CULTURE, BLOOD (ROUTINE X 2)     Status: Normal   Collection Time   08/22/11 11:20 PM      Component Value Range Status Comment   Specimen Description BLOOD RIGHT ARM   Final    Special Requests BOTTLES DRAWN AEROBIC AND ANAEROBIC 10CC   Final    Culture  Setup Time 08/23/2011 05:11   Final    Culture     Final    Value: ENTEROCOCCUS SPECIES     Note: SUSCEPTIBILITIES PERFORMED ON PREVIOUS CULTURE WITHIN THE LAST 5 DAYS.     Note: Gram Stain Report Called to,Read Back By and Verified With: RN K. GENGLER ON 08/23/11 AT 2145 BY DTERRY   Report Status 08/25/2011 FINAL   Final   CATH TIP CULTURE     Status: Normal   Collection Time   08/23/11  2:19 PM      Component Value Range Status Comment   Specimen Description CATH TIP   Final    Special Requests NONE   Final    Culture     Final    Value: 80 COLONIES VANCOMYCIN RESISTANT ENTEROCOCCUS ISOLATED     Note: RVB TINA O 08/27/11@830  BY REAMM   Report Status 08/27/2011 FINAL   Final    Organism ID, Bacteria VANCOMYCIN RESISTANT ENTEROCOCCUS ISOLATED   Final   CULTURE, BLOOD (ROUTINE X 2)     Status: Normal (Preliminary result)   Collection Time   08/26/11  7:55 AM      Component Value Range Status Comment   Specimen Description BLOOD HAND LEFT   Final    Special Requests BOTTLES DRAWN AEROBIC ONLY 10CC   Final    Culture  Setup Time 08/26/2011 15:00   Final    Culture     Final    Value:        BLOOD CULTURE RECEIVED NO GROWTH TO DATE CULTURE WILL BE HELD FOR 5 DAYS BEFORE ISSUING A FINAL NEGATIVE REPORT   Report  Status PENDING   Incomplete   CULTURE, BLOOD (ROUTINE X 2)     Status: Normal (Preliminary result)   Collection Time   08/26/11  8:05 AM      Component Value Range Status Comment   Specimen Description BLOOD LEFT FOREARM   Final    Special Requests BOTTLES DRAWN AEROBIC AND ANAEROBIC 10CC EACH   Final    Culture  Setup Time 08/26/2011  15:01   Final    Culture     Final    Value:        BLOOD CULTURE RECEIVED NO GROWTH TO DATE CULTURE WILL BE HELD FOR 5 DAYS BEFORE ISSUING A FINAL NEGATIVE REPORT   Report Status PENDING   Incomplete    Studies/Results: No results found.  Medications:  Scheduled Meds:    . aspirin  81 mg Oral Daily  . DAPTOmycin (CUBICIN)  IV  6 mg/kg (Order-Specific) Intravenous Custom  . darbepoetin  100 mcg Intravenous Q Sat-HD  . darunavir  800 mg Oral Q breakfast  . feeding supplement  237 mL Oral BID BM  . lamiVUDine  50 mg Oral Daily  . midodrine  10 mg Oral BID WC  . multivitamin  1 tablet Oral QHS  . pantoprazole  20 mg Oral Q1200  . paricalcitol  4 mcg Intravenous Q T,Th,Sa-HD  . ritonavir  100 mg Oral Q breakfast  . sodium chloride  3 mL Intravenous Q12H  . stavudine  15 mg Oral Daily  . 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 Diff (07/2011), MGUS, h/o  anemia, h/o pancytopenia/thrombocytopenia with EF 40-45% prior to admission.  She presented 8/16 with fever and ab pain.    1. Bacteremia -4 blood Cxs aerobic/ anaerobic +VRE and previous cath tip +enterococcus 80K colonies  -old HD cath removed 8/17 d/t HD related bacteremia  Plan -repeat blood cultures NTD will follow  -cont Daptomycin (8/22) dosed q48 with HD  -ID consulted and following-pt has outpt f/u with Dr. Tommy Medal 09/11/11; pt may need abx duration of 2 wks to 8 wks but Dr. Tommy Medal will determine in future  -renal and VVS following pt   2. C.diff -still with diarrhea  -isolation precautions and  protocol -Rx oral Vancomycin 125 mg qid (since 8/16) will tx for duration of abx therapy for bacteremia plus 2 weeks after discontinue abx for bacteremia   3. Bradycardia  -pt is asymptomatic, HR 72  -will monitor, NTD currently   4. Ischemic and Nonischemic cardiomyopathy -EF 35-40% -medical management: avoid BB d/t bradycardia but can try ACEI or ARB when bp can tolerate -cards consulted they also rec vasodilator tx if bp improves -will schedule outpt cards f/u  5. History of hypotension -BP 96/72 -cont hold home Lopressor  -Cont Midodrine 10 mg bid  -will monitor bp -careful with IVF boluses 2/2 #4  6. HIV INFECTION  -repeat cd 4 ct 8/20 was 310; HIV genotype pending -home meds (Norvir, Epivir, Prezista, Zerit) -no PCP px with Bactrim 3x per week per ID Dr. Tommy Medal as of now -ID following and pt will have outpt ID f/u 09/11/11  7. ESRD (end stage  renal disease) (03/19/2007) -renal consulted and following -Cr. baseline 3-4 range  -HD access chest pulled after dialysis 8/17 d/t bacteremia -new diatek cath placed right fem vein on 8/20 by VVS -HD TTHSat  -VVS planning new RUE AVgraft 09/01/11 so pt NPO after midnight  8. ANEMIA OF CHRONIC DISEASE -Aransep   9. H/o Pancytopenia/Thrombocytopenia  -chronic pancytopenia, thrombocytopenia noted 8/15 on admission with plts 146 -will monitor cbc   10. DVT/GIPx -scds -protonix   11. F/E/N -renal following electrolytes -diet ordered   12. Dispo -d/c early this week -social work working on [employer] [facility]   Code status: full   LOS: 10 days   Cresenciano Genre [id number] 08/31/2011, 3:26 PM

## 2011-08-31 NOTE — Progress Notes (Signed)
Subjective: Interval History: none..   Objective: Vital signs in last 24 hours: Temp:  [97.9 F (36.6 C)-99.2 F (37.3 C)] 97.9 F (36.6 C) (08/25 0910) Pulse Rate:  [59-84] 84  (08/25 0910) Resp:  [16-18] 18  (08/25 0910) BP: (90-122)/(59-72) 97/72 mmHg (08/25 0910) SpO2:  [90 %-98 %] 94 % (08/25 0910) Weight:  [111 lb 8.8 oz (50.6 kg)] 111 lb 8.8 oz (50.6 kg) (08/24 2100)  Intake/Output from previous day: 08/24 0701 - 08/25 0700 In: 421.6 [P.O.:300; I.V.:16; IV Piggyback:105.6] Out: 1244  Intake/Output this shift:    No change in her physical exam. Venogram sites looked okay. Remains afebrile.  Lab Results:  Basename 08/30/11 0730 08/29/11 0607  WBC 4.0 3.5*  HGB 9.2* 10.1*  HCT 30.7* 33.0*  PLT 114* 124*   BMET  Basename 08/30/11 0730 08/29/11 0607  NA 137 135  K 4.5 4.3  CL 101 100  CO2 29 26  GLUCOSE 89 79  BUN 16 8  CREATININE 4.57* 3.03*  CALCIUM 9.1 8.9    Studies/Results: Ir Veno/ext/bi  2011-09-16  *RADIOLOGY REPORT*  Clinical Data: End-stage renal disease, venous access planning for new access, current access is a femoral tunneled catheter  ULTRASOUND GUIDANCE VASCULAR ACCESS BILATERAL UPPER EXTREMITY AND CENTRAL VENOGRAMS  Date:  16-Sep-2011 09:50:00  Radiologist:  M. Daryll Brod, M.D.  Medications:  1% lidocaine locally  Guidance:  Ultrasound fluoroscopic  Fluoroscopy time:  0.8 minutes  Sedation time:  None.  Contrast volume:  40 ml [id number]  Complications:  No immediate  PROCEDURE/FINDINGS:  Informed consent was obtained from the patient following explanation of the procedure, risks, benefits and alternatives. The patient understands, agrees and consents for the procedure. All questions were addressed.  A time out was performed.  Maximal barrier sterile technique utilized including caps, mask, sterile gowns, sterile gloves, large sterile drape, hand hygiene, and Chloroprep  Under sterile conditions and local anesthesia, micropuncture access was  performed with ultrasound of the left upper extremity brachial vein above the elbow.  Guide wire was advanced followed by the 4- [place] dilator.  Dilator remains in place for contrast injection to perform venography.  In a similar fashion, the right brachial vein was accessed with a micropuncture needle utilizing direct ultrasound.  This was also done under sterile conditions.  Guide wire advanced followed by a micro dilator.  Dilator remains for contrast injection for a right upper extremity venogram  Left upper extremity:  Small caliber recanalized brachial veins evident in the upper arm.  Axillary vein is patent more centrally. Within the chest, the subclavian vein is patent with a focal stenosis at the distal clavicle and first rib.  Contrast eventually passes through this area to opacified a small caliber narrowed left innominate vein.  Jugular paraspinal collaterals noted.  SVC patent.  Right upper extremity:  Right brachial and axillary veins are patent peripherally.  Right subclavian vein also demonstrates a focal stenosis at the distal clavicle first rib however this is not significantly flow limiting as contrast easily passes through into a patent right innominate vein and SVC.  IMPRESSION: Upper extremity venous anatomy as described.  The right upper extremity peripheral and central veins appear more favorable for dialysis access than the left side.   Original Report Authenticated By: Jerilynn Mages. Daryll Brod, M.D.    Ir US Guide Vasc Access Left  2011/09/16  *RADIOLOGY REPORT*  Clinical Data: End-stage renal disease, venous access planning for new access, current access is a femoral tunneled catheter  ULTRASOUND  GUIDANCE VASCULAR ACCESS BILATERAL UPPER EXTREMITY AND CENTRAL VENOGRAMS  Date:  08/28/2011 09:50:00  Radiologist:  M. Daryll Brod, M.D.  Medications:  1% lidocaine locally  Guidance:  Ultrasound fluoroscopic  Fluoroscopy time:  0.8 minutes  Sedation time:  None.  Contrast volume:  40 ml [id number]   Complications:  No immediate  PROCEDURE/FINDINGS:  Informed consent was obtained from the patient following explanation of the procedure, risks, benefits and alternatives. The patient understands, agrees and consents for the procedure. All questions were addressed.  A time out was performed.  Maximal barrier sterile technique utilized including caps, mask, sterile gowns, sterile gloves, large sterile drape, hand hygiene, and Chloroprep  Under sterile conditions and local anesthesia, micropuncture access was performed with ultrasound of the left upper extremity brachial vein above the elbow.  Guide wire was advanced followed by the 4- [place] dilator.  Dilator remains in place for contrast injection to perform venography.  In a similar fashion, the right brachial vein was accessed with a micropuncture needle utilizing direct ultrasound.  This was also done under sterile conditions.  Guide wire advanced followed by a micro dilator.  Dilator remains for contrast injection for a right upper extremity venogram  Left upper extremity:  Small caliber recanalized brachial veins evident in the upper arm.  Axillary vein is patent more centrally. Within the chest, the subclavian vein is patent with a focal stenosis at the distal clavicle and first rib.  Contrast eventually passes through this area to opacified a small caliber narrowed left innominate vein.  Jugular paraspinal collaterals noted.  SVC patent.  Right upper extremity:  Right brachial and axillary veins are patent peripherally.  Right subclavian vein also demonstrates a focal stenosis at the distal clavicle first rib however this is not significantly flow limiting as contrast easily passes through into a patent right innominate vein and SVC.  IMPRESSION: Upper extremity venous anatomy as described.  The right upper extremity peripheral and central veins appear more favorable for dialysis access than the left side.   Original Report Authenticated By: Jerilynn Mages. Daryll Brod,  M.D.    Ir US Guide Vasc Access Right  08/28/2011  *RADIOLOGY REPORT*  Clinical Data: End-stage renal disease, venous access planning for new access, current access is a femoral tunneled catheter  ULTRASOUND GUIDANCE VASCULAR ACCESS BILATERAL UPPER EXTREMITY AND CENTRAL VENOGRAMS  Date:  08/28/2011 09:50:00  Radiologist:  M. Daryll Brod, M.D.  Medications:  1% lidocaine locally  Guidance:  Ultrasound fluoroscopic  Fluoroscopy time:  0.8 minutes  Sedation time:  None.  Contrast volume:  40 ml [id number]  Complications:  No immediate  PROCEDURE/FINDINGS:  Informed consent was obtained from the patient following explanation of the procedure, risks, benefits and alternatives. The patient understands, agrees and consents for the procedure. All questions were addressed.  A time out was performed.  Maximal barrier sterile technique utilized including caps, mask, sterile gowns, sterile gloves, large sterile drape, hand hygiene, and Chloroprep  Under sterile conditions and local anesthesia, micropuncture access was performed with ultrasound of the left upper extremity brachial vein above the elbow.  Guide wire was advanced followed by the 4- [place] dilator.  Dilator remains in place for contrast injection to perform venography.  In a similar fashion, the right brachial vein was accessed with a micropuncture needle utilizing direct ultrasound.  This was also done under sterile conditions.  Guide wire advanced followed by a micro dilator.  Dilator remains for contrast injection for a right upper extremity venogram  Left upper  extremity:  Small caliber recanalized brachial veins evident in the upper arm.  Axillary vein is patent more centrally. Within the chest, the subclavian vein is patent with a focal stenosis at the distal clavicle and first rib.  Contrast eventually passes through this area to opacified a small caliber narrowed left innominate vein.  Jugular paraspinal collaterals noted.  SVC patent.  Right upper  extremity:  Right brachial and axillary veins are patent peripherally.  Right subclavian vein also demonstrates a focal stenosis at the distal clavicle first rib however this is not significantly flow limiting as contrast easily passes through into a patent right innominate vein and SVC.  IMPRESSION: Upper extremity venous anatomy as described.  The right upper extremity peripheral and central veins appear more favorable for dialysis access than the left side.   Original Report Authenticated By: Jerilynn Mages. Daryll Brod, M.D.    Dg Chest Port 1 View  08/26/2011  *RADIOLOGY REPORT*  Clinical Data: Right femoral Diatek catheter placement  PORTABLE CHEST - 1 VIEW  Comparison: 07/11/2011  Findings: Femoral catheter has been placed with tips in the right atrium and SVC.  Right jugular catheter has been removed since the prior study.  Cardiac enlargement.  Normal vascularity.  No edema or effusion.  IMPRESSION: Femoral catheter tips in the right atrium and SVC.  Lungs are clear.   Original Report Authenticated By: Truett Perna, M.D.    Dg Abd Acute W/chest  08/22/2011  *RADIOLOGY REPORT*  Clinical Data: Abdominal pain  ACUTE ABDOMEN SERIES (ABDOMEN 2 VIEW & CHEST 1 VIEW)  Comparison: 07/11/2011 and earlier studies  Findings: Right IJ tunneled hemodialysis catheter extends to the proximal right atrium.  Stable mild cardiomegaly.  Vascular clips in the left axilla.  Lungs are clear.  No effusion.  No free air. Nonobstructive bowel gas pattern.  Left pelvic phlebolith. Regional bones unremarkable.  IMPRESSION:  1.  Stable mild cardiomegaly. 2.  Normal bowel gas pattern.  No free air.  Original Report Authenticated By: Trecia Rogers, M.D.   Anti-infectives: Anti-infectives     Start     Dose/Rate Route Frequency Ordered Stop   08/30/11 2200   DAPTOmycin (CUBICIN) 282 mg in sodium chloride 0.9 % IVPB        6 mg/kg  47 kg (Order-Specific) 211.3 mL/hr over 30 Minutes Intravenous Once per day on Tue Thu Sat  08/28/11 1251     08/28/11 2200   DAPTOmycin (CUBICIN) 282 mg in sodium chloride 0.9 % IVPB  Status:  Discontinued        6 mg/kg  47 kg (Order-Specific) 211.3 mL/hr over 30 Minutes Intravenous Once per day on Tue Thu Sat 08/28/11 1250 08/28/11 1251   08/28/11 2200   DAPTOmycin (CUBICIN) 282 mg in sodium chloride 0.9 % IVPB        6 mg/kg  47 kg (Order-Specific) 211.3 mL/hr over 30 Minutes Intravenous  Once 08/28/11 1254 08/28/11 2245   08/28/11 1400   DAPTOmycin (CUBICIN) 282 mg in sodium chloride 0.9 % IVPB  Status:  Discontinued        6 mg/kg  47 kg (Order-Specific) 211.3 mL/hr over 30 Minutes Intravenous  Once 08/28/11 1250 08/28/11 1254   08/27/11 1000   stavudine (ZERIT) capsule 15 mg        15 mg Oral Daily 08/26/11 1308     08/26/11 0600   cefUROXime (ZINACEF) 1.5 g in dextrose 5 % 50 mL IVPB        1.5  g 100 mL/hr over 30 Minutes Intravenous On call to O.R. 08/25/11 1453 08/26/11 1429   08/24/11 1500   linezolid (ZYVOX) IVPB 600 mg  Status:  Discontinued        600 mg 300 mL/hr over 60 Minutes Intravenous Every 12 hours 08/24/11 1418 08/28/11 1135   08/23/11 1200   vancomycin (VANCOCIN) 500 mg in sodium chloride 0.9 % 100 mL IVPB  Status:  Discontinued        500 mg 100 mL/hr over 60 Minutes Intravenous Every T-Th-Sa (Hemodialysis) 08/22/11 2326 08/24/11 1418   08/22/11 2359   cefTRIAXone (ROCEPHIN) 1 g in dextrose 5 % 50 mL IVPB  Status:  Discontinued        1 g 100 mL/hr over 30 Minutes Intravenous Every 24 hours 08/22/11 2326 08/24/11 1418   08/22/11 2330   vancomycin (VANCOCIN) IVPB 1000 mg/200 mL premix        1,000 mg 200 mL/hr over 60 Minutes Intravenous NOW 08/22/11 2326 08/23/11 0050   08/22/11 1530   vancomycin (VANCOCIN) 50 mg/mL oral solution 125 mg        125 mg Oral 4 times daily 08/22/11 1519 09/05/11 1359   08/22/11 1000   lamiVUDine (EPIVIR) 10 MG/ML solution 50 mg        50 mg Oral Daily 08/22/11 0509     08/22/11 1000   Stavudine SOLR 20 mg   Status:  Discontinued        20 mL Oral Daily 08/22/11 0509 08/22/11 0520   08/22/11 1000   stavudine (ZERIT) capsule 20 mg  Status:  Discontinued        20 mg Oral Daily 08/22/11 0521 08/26/11 1308   08/22/11 0800   Darunavir Ethanolate (PREZISTA) tablet 800 mg        800 mg Oral Daily with breakfast 08/22/11 0509     08/22/11 0800   ritonavir (NORVIR) tablet 100 mg        100 mg Oral Daily with breakfast 08/22/11 0509            Assessment/Plan: s/p Procedure(s) (LRB): TRANSESOPHAGEAL ECHOCARDIOGRAM (TEE) (N/A) Again discussed plan for elective right upper arm AV Gore-Tex graft tomorrow. She will be n.p.o. for surgery.   LOS: 10 days   [name] 08/31/2011, 11:46 AM

## 2011-08-31 NOTE — Progress Notes (Signed)
ANTIBIOTIC CONSULT NOTE - Follow UP Pharmacy Consult for Daptomycin Indication: Vancomycin-Resistant Enterococcus Bacteremia  No Known Allergies  Patient Measurements: Height: 5\' 3"  (160 cm) Weight: 111 lb 8.8 oz (50.6 kg) IBW/kg (Calculated) : 52.4   Vital Signs: Temp: 97.9 F (36.6 C) (08/25 0910) Temp src: Oral (08/25 0910) BP: 97/72 mmHg (08/25 0910) Pulse Rate: 84  (08/25 0910) Intake/Output from previous day: 08/24 0701 - 08/25 0700 In: 421.6 [P.O.:300; I.V.:16; IV Piggyback:105.6] Out: 1244  Intake/Output from this shift:    Labs:  Basename 08/30/11 0730 08/29/11 0607  WBC 4.0 3.5*  HGB 9.2* 10.1*  PLT 114* 124*  LABCREA -- --  CREATININE 4.57* 3.03*   Estimated Creatinine Clearance: 11.2 ml/min (by C-G formula based on Cr of 4.57). No results found for this basename: VANCOTROUGH:2,VANCOPEAK:2,VANCORANDOM:2,GENTTROUGH:2,GENTPEAK:2,GENTRANDOM:2,TOBRATROUGH:2,TOBRAPEAK:2,TOBRARND:2,AMIKACINPEAK:2,AMIKACINTROU:2,AMIKACIN:2, in the last 72 hours   Microbiology: Recent Results (from the past 720 hour(s))  CULTURE, BLOOD (ROUTINE X 2)     Status: Normal   Collection Time   08/21/11 11:35 PM      Component Value Range Status Comment   Specimen Description BLOOD LEFT ARM   Final    Special Requests BOTTLES DRAWN AEROBIC AND ANAEROBIC 5ML   Final    Culture  Setup Time 08/22/2011 04:58   Final    Culture     Final    Value: VANCOMYCIN RESISTANT ENTEROCOCCUS ISOLATED     4 Note: DAPTOMYCIN S CRITICAL RESULT CALLED TO, READ BACK BY AND VERIFIED WITH: ASHLEY@ 1025 08/25/11 BY KRAWS     Note: Gram Stain Report Called to,Read Back By and Verified With: KIMBERLY PORTER @ 2150 ON 08/22/11 BY GOLLD   Report Status 08/30/2011 FINAL   Final    Organism ID, Bacteria VANCOMYCIN RESISTANT ENTEROCOCCUS ISOLATED   Final   CULTURE, BLOOD (ROUTINE X 2)     Status: Normal   Collection Time   08/21/11 11:59 PM      Component Value Range Status Comment   Specimen Description BLOOD LEFT  HAND   Final    Special Requests BOTTLES DRAWN AEROBIC AND ANAEROBIC 5CC   Final    Culture  Setup Time 08/22/2011 04:59   Final    Culture     Final    Value: ENTEROCOCCUS SPECIES     Note: SUSCEPTIBILITIES PERFORMED ON PREVIOUS CULTURE WITHIN THE LAST 5 DAYS.     Note: Gram Stain Report Called to,Read Back By and Verified With: KIMBERLY PORTER @ 2150 ON 08/22/11 BY GOLLD   Report Status 08/25/2011 FINAL   Final   MRSA PCR SCREENING     Status: Normal   Collection Time   08/22/11  5:24 AM      Component Value Range Status Comment   MRSA by PCR NEGATIVE  NEGATIVE Final   CLOSTRIDIUM DIFFICILE BY PCR     Status: Abnormal   Collection Time   08/22/11 10:48 AM      Component Value Range Status Comment   C difficile by pcr POSITIVE (*) NEGATIVE Final   CULTURE, BLOOD (ROUTINE X 2)     Status: Normal   Collection Time   08/22/11 11:15 PM      Component Value Range Status Comment   Specimen Description BLOOD RIGHT ARM   Final    Special Requests BOTTLES DRAWN AEROBIC AND ANAEROBIC 10CC   Final    Culture  Setup Time 08/23/2011 05:11   Final    Culture     Final    Value: ENTEROCOCCUS SPECIES  Note: SUSCEPTIBILITIES PERFORMED ON PREVIOUS CULTURE WITHIN THE LAST 5 DAYS.     Note: Gram Stain Report Called to,Read Back By and Verified With: RN K. GENGLER ON 08/23/11 AT 2145 BY DTERRY   Report Status 08/25/2011 FINAL   Final   CULTURE, BLOOD (ROUTINE X 2)     Status: Normal   Collection Time   08/22/11 11:20 PM      Component Value Range Status Comment   Specimen Description BLOOD RIGHT ARM   Final    Special Requests BOTTLES DRAWN AEROBIC AND ANAEROBIC 10CC   Final    Culture  Setup Time 08/23/2011 05:11   Final    Culture     Final    Value: ENTEROCOCCUS SPECIES     Note: SUSCEPTIBILITIES PERFORMED ON PREVIOUS CULTURE WITHIN THE LAST 5 DAYS.     Note: Gram Stain Report Called to,Read Back By and Verified With: RN K. GENGLER ON 08/23/11 AT 2145 BY DTERRY   Report Status 08/25/2011 FINAL    Final   CATH TIP CULTURE     Status: Normal   Collection Time   08/23/11  2:19 PM      Component Value Range Status Comment   Specimen Description CATH TIP   Final    Special Requests NONE   Final    Culture     Final    Value: 80 COLONIES VANCOMYCIN RESISTANT ENTEROCOCCUS ISOLATED     Note: RVB TINA O 08/27/11@830  BY REAMM   Report Status 08/27/2011 FINAL   Final    Organism ID, Bacteria VANCOMYCIN RESISTANT ENTEROCOCCUS ISOLATED   Final   CULTURE, BLOOD (ROUTINE X 2)     Status: Normal (Preliminary result)   Collection Time   08/26/11  7:55 AM      Component Value Range Status Comment   Specimen Description BLOOD HAND LEFT   Final    Special Requests BOTTLES DRAWN AEROBIC ONLY 10CC   Final    Culture  Setup Time 08/26/2011 15:00   Final    Culture     Final    Value:        BLOOD CULTURE RECEIVED NO GROWTH TO DATE CULTURE WILL BE HELD FOR 5 DAYS BEFORE ISSUING A FINAL NEGATIVE REPORT   Report Status PENDING   Incomplete   CULTURE, BLOOD (ROUTINE X 2)     Status: Normal (Preliminary result)   Collection Time   08/26/11  8:05 AM      Component Value Range Status Comment   Specimen Description BLOOD LEFT FOREARM   Final    Special Requests BOTTLES DRAWN AEROBIC AND ANAEROBIC 10CC EACH   Final    Culture  Setup Time 08/26/2011 15:01   Final    Culture     Final    Value:        BLOOD CULTURE RECEIVED NO GROWTH TO DATE CULTURE WILL BE HELD FOR 5 DAYS BEFORE ISSUING A FINAL NEGATIVE REPORT   Report Status PENDING   Incomplete     Medical History: Past Medical History  Diagnosis Date  . Human immunodeficiency virus (HIV) disease   . Hypertension   . ESRD (end stage renal disease)   . Dialysis patient     pt on dialysis since 2010.  [name] Clostridium difficile infection 04/04/10    07/2011 and 08/2011  . Bacteriuria, asymptomatic 04/04/10    Culture grew VRE sensitive to linesolid   . MGUS (monoclonal gammopathy of unknown significance)   . History of  bacteremia     MSSA  . Renal  insufficiency   . Anemia   . Bacteremia     05/2011 and 08/2011    Admit Compliant/ HPI: 56 y.o. F with ESRD and extensive ID PMH that includes HIV/AIDs, bacterial endocarditis, PCP, MSSA bacteremia. She was admitted on 8/16 with abdominal pain, fever, and neutropenia. At that time she was found to bacteremia with enterococcus that was found to be vancomycin resistant. Pharmacy consulted to dose daptomycin after anemia and thromboyctopenia episode attributed to linezolid.    Assessment: Antibiotics: VRE Bacteremia, HIV. On lamivudine/stavudine/ritonavir-darunavir. (NRTI/NRTI/boosted PI) -- doses appropriate with ESRD. Hx noncompliance with HIV meds -- last CD4 210 and viral load 4783 on 07/28/11 (improved from prior numbers). No PCP px for now. Positive Cdiff -- on oral Vancomycin, to continue x 2 weeks post VRE treatment, diarrhea continues. Afebrile, WBC 4.0, platelet trend down (114)   8/20 BCx>> ngtd 8/16, 8/17 BCx >> VRE (S-Zyvox) 8/16 C-Diff + 8/16 MRSA PCR negative 8/15 BCx x2 >> VRE (S-Zyvox)  Daptomycin 8/22 >> (planned 10/15?) Linezolid 8/18 >> 8/22 Vanc po 8/16 >> Vanc IV 8/16 >> 8/18 Rocephin 8/16 >> 8/18  Goal of Therapy:  Eradication of Infection  Plan:  1.Daptomycin 282 mg (~6 mg/kg) IV post HD sessions on T/Th/Sat 3. Baseline CPK, 63, 8/22, follow weekly while on Daptomycin, next 8/29 4. Will continue to follow HD schedule/duration, additional culture results, and LOT   Thank you for allowing pharmacy to be a part of this patients care team.  Rowe Robert Pharm.D., BCPS Clinical Pharmacist 08/31/2011 11:53 AM Pager: [phone] Phone: [phone]

## 2011-08-31 NOTE — Progress Notes (Signed)
Subjective:  Some Diarrhea last night/ eating biscuits/gravy and bacon breakfast, tolerated hd yesterday Objective Vital signs in last 24 hours: Filed Vitals:   08/30/11 1447 08/30/11 1735 08/30/11 2100 08/31/11 0450  BP: 90/59 100/64 113/69 122/63  Pulse: 68 69 64 59  Temp: 98.3 F (36.8 C) 98.4 F (36.9 C) 98.5 F (36.9 C) 99.2 F (37.3 C)  TempSrc: Oral Oral Oral Oral  Resp: 18 18 16 16   Height:      Weight:   50.6 kg (111 lb 8.8 oz)   SpO2: 90% 98% 95% 92%   Weight change: 5.8 kg (12 lb 12.6 oz)  Intake/Output Summary (Last 24 hours) at 08/31/11 0858 Last data filed at 08/31/11 0700  Gross per 24 hour  Intake 421.64 ml  Output   1244 ml  Net -822.36 ml   Labs: Basic Metabolic Panel:  Lab [id number] 0730 08/29/11 0607 08/28/11 0600  NA 137 135 134*  K 4.5 4.3 4.7  CL 101 100 99  CO2 29 26 28   GLUCOSE 89 79 75  BUN 16 8 17   CREATININE 4.57* 3.03* 4.21*  CALCIUM 9.1 8.9 8.9  ALB -- -- --  PHOS 3.1 2.6 3.2   Liver Function Tests:  Lab 08/30/11 0730 08/29/11 0607 08/28/11 0600  AST -- -- --  ALT -- -- --  ALKPHOS -- -- --  BILITOT -- -- --  PROT -- -- --  ALBUMIN 2.2* 2.3* 2.2*   No results found for this basename: LIPASE:3,AMYLASE:3 in the last 168 hours No results found for this basename: AMMONIA:3 in the last 168 hours CBC:  Lab 08/30/11 0730 08/29/11 0607 08/28/11 0600 08/27/11 0635 08/26/11 0755  WBC 4.0 3.5* 4.7 -- --  NEUTROABS 2.2 2.3 2.0 -- --  HGB 9.2* 10.1* 9.6* -- --  HCT 30.7* 33.0* 31.5* -- --  MCV 103.0* 101.5* 101.3* 99.1 100.5*  PLT 114* 124* 138* -- --   Cardiac Enzymes:  Lab 08/29/11 0607  CKTOTAL 63  CKMB --  CKMBINDEX --  TROPONINI --   CBG:  Lab 08/26/11 1137  GLUCAP 85    Iron Studies: No results found for this basename: IRON,TIBC,TRANSFERRIN,FERRITIN in the last 72 hours Studies/Results: No results found. Medications:      . aspirin  81 mg Oral Daily  . DAPTOmycin (CUBICIN)  IV  6 mg/kg (Order-Specific)  Intravenous Custom  . darbepoetin  100 mcg Intravenous Q Sat-HD  . darunavir  800 mg Oral Q breakfast  . feeding supplement  237 mL Oral BID BM  . lamiVUDine  50 mg Oral Daily  . midodrine  10 mg Oral BID WC  . multivitamin  1 tablet Oral QHS  . pantoprazole  20 mg Oral Q1200  . paricalcitol  4 mcg Intravenous Q T,Th,Sa-HD  . ritonavir  100 mg Oral Q breakfast  . sodium chloride  3 mL Intravenous Q12H  . stavudine  15 mg Oral Daily  . vancomycin  125 mg Oral QID   I  have reviewed scheduled and prn medications.  Physical Exam: General: Alert NAD Heart: RRR, 2/6 hsm  apex Lungs: decreased bs at bases Abdomen: bs pos. Soft non tender, liver down 3cm Extremities: Dialysis Access=:left pedal edema 1+ and rightt trace pedal edema/ right femoral perm cath  Problem/Plan: 1 [facility] vol ok for HD tts ([facility])=.noted Dr. Donnetta Hutching plans =''could have angioplasty of her venous narrowing in her subclavian should she have flow problems or high flow rates or swelling which I doubt we  will schedule this for next week assuming resolution of her sepsis ." HD on tues next normal hd/   Follow labs  With her being on Regular Diet. 2 Anemia   hgb 9.2 yesterday 10.1 Aranesp 176mcg 3 HPTH stable on vit d , no binders, phos 3.1 moniter  On REG. DIET 4 HIV = meds per id/  Admit team 5 Sepsis VRE switched to Dapto, will need to contact [facility] to make sure they have it  , I have called [facility] they have it. BUT IF SHE is Transferred to [facility] in [place] need verification there. 6 Dispo = ?? Will Nephrology MD  With different center accept? Social worker coordination needed. Discussed plans and concerns for patient.  Ernest Haber, PA-C [facility] Beeper [phone] 08/31/2011,8:58 AM  LOS: 10 days   I have seen and examined this patient and agree with the plan of care seen and eval.  Discussed issues and answered questions. .  Trenten Watchman L 08/31/2011, 10:10 AM

## 2011-09-01 ENCOUNTER — Encounter (HOSPITAL_COMMUNITY): Payer: Self-pay | Admitting: Anesthesiology

## 2011-09-01 ENCOUNTER — Encounter (HOSPITAL_COMMUNITY): Payer: Self-pay

## 2011-09-01 ENCOUNTER — Encounter (HOSPITAL_COMMUNITY)
Admission: EM | Disposition: A | Discharge status: Home / Self Care | Payer: Self-pay | Source: Home / Self Care | Attending: Internal Medicine

## 2011-09-01 ENCOUNTER — Inpatient Hospital Stay (HOSPITAL_COMMUNITY): Payer: Medicaid Other | Admitting: Anesthesiology

## 2011-09-01 DIAGNOSIS — N186 End stage renal disease: Secondary | ICD-10-CM

## 2011-09-01 HISTORY — PX: AV FISTULA PLACEMENT: SHX1204

## 2011-09-01 LAB — CULTURE, BLOOD (ROUTINE X 2)
Culture: NO GROWTH
Culture: NO GROWTH

## 2011-09-01 LAB — RENAL FUNCTION PANEL
Albumin: 2.1 g/dL — ABNORMAL LOW (ref 3.5–5.2)
BUN: 18 mg/dL (ref 6–23)
CO2: 30 mEq/L (ref 19–32)
Calcium: 9.5 mg/dL (ref 8.4–10.5)
Chloride: 101 mEq/L (ref 96–112)
Creatinine, Ser: 4.88 mg/dL — ABNORMAL HIGH (ref 0.50–1.10)
GFR calc Af Amer: 11 mL/min — ABNORMAL LOW (ref >90)
GFR calc non Af Amer: 9 mL/min — ABNORMAL LOW (ref >90)
Glucose, Bld: 79 mg/dL (ref 70–99)
Phosphorus: 2.4 mg/dL (ref 2.3–4.6)
Potassium: 5 mEq/L (ref 3.5–5.1)
Sodium: 135 mEq/L (ref 135–145)

## 2011-09-01 LAB — CBC
HCT: 31.3 % — ABNORMAL LOW (ref 36.0–46.0)
Hemoglobin: 9.6 g/dL — ABNORMAL LOW (ref 12.0–15.0)
MCH: 31.6 pg (ref 26.0–34.0)
MCHC: 30.7 g/dL (ref 30.0–36.0)
MCV: 103 fL — ABNORMAL HIGH (ref 78.0–100.0)
Platelets: 98 10*3/uL — ABNORMAL LOW (ref 150–400)
RBC: 3.04 MIL/uL — ABNORMAL LOW (ref 3.87–5.11)
RDW: 17.2 % — ABNORMAL HIGH (ref 11.5–15.5)
WBC: 4.9 10*3/uL (ref 4.0–10.5)

## 2011-09-01 LAB — RETICULOCYTES
RBC.: 2.99 MIL/uL — ABNORMAL LOW (ref 3.87–5.11)
Retic Count, Absolute: 56.8 10*3/uL (ref 19.0–186.0)
Retic Ct Pct: 1.9 % (ref 0.4–3.1)

## 2011-09-01 LAB — IRON AND TIBC
Iron: 40 ug/dL — ABNORMAL LOW (ref 42–135)
Saturation Ratios: 33 % (ref 20–55)
TIBC: 123 ug/dL — ABNORMAL LOW (ref 250–470)
UIBC: 83 ug/dL — ABNORMAL LOW (ref 125–400)

## 2011-09-01 SURGERY — INSERTION OF ARTERIOVENOUS (AV) GORE-TEX GRAFT ARM
Anesthesia: General | Site: Arm Upper | Laterality: Right | Wound class: Clean

## 2011-09-01 MED ORDER — LIDOCAINE HCL (CARDIAC) 20 MG/ML IV SOLN
INTRAVENOUS | Status: DC | PRN
  Administered 2011-09-01: 60 mg via INTRAVENOUS

## 2011-09-01 MED ORDER — FENTANYL CITRATE 0.05 MG/ML IJ SOLN
INTRAMUSCULAR | Status: DC | PRN
  Administered 2011-09-01: 50 ug via INTRAVENOUS

## 2011-09-01 MED ORDER — EPHEDRINE SULFATE 50 MG/ML IJ SOLN
INTRAMUSCULAR | Status: DC | PRN
  Administered 2011-09-01: 10 mg via INTRAVENOUS

## 2011-09-01 MED ORDER — PROPOFOL 10 MG/ML IV EMUL
INTRAVENOUS | Status: DC | PRN
  Administered 2011-09-01: 160 mg via INTRAVENOUS

## 2011-09-01 MED ORDER — CEFUROXIME SODIUM 1.5 G IJ SOLR
INTRAMUSCULAR | Status: AC
  Filled 2011-09-01: qty 1.5

## 2011-09-01 MED ORDER — PHENYLEPHRINE HCL 10 MG/ML IJ SOLN
INTRAMUSCULAR | Status: DC | PRN
  Administered 2011-09-01 (×3): 50 ug via INTRAVENOUS
  Administered 2011-09-01: 100 ug via INTRAVENOUS

## 2011-09-01 MED ORDER — HYDROMORPHONE HCL PF 1 MG/ML IJ SOLN: 0.2500 mg | INTRAMUSCULAR | Status: DC | PRN

## 2011-09-01 MED ORDER — DEXTROSE 5 % IV SOLN
1.5000 g | INTRAVENOUS | Status: DC | PRN
  Administered 2011-09-01: 1.5 g via INTRAVENOUS

## 2011-09-01 MED ORDER — SODIUM CHLORIDE 0.9 % IV SOLN
INTRAVENOUS | Status: DC
  Administered 2011-09-01: 11:00:00 via INTRAVENOUS

## 2011-09-01 MED ORDER — SODIUM CHLORIDE 0.9 % IR SOLN
Status: DC | PRN
  Administered 2011-09-01: 13:00:00

## 2011-09-01 MED ORDER — ALBUMIN HUMAN 5 % IV SOLN
INTRAVENOUS | Status: AC
  Filled 2011-09-01: qty 250

## 2011-09-01 MED ORDER — ONDANSETRON HCL 4 MG/2ML IJ SOLN
INTRAMUSCULAR | Status: DC | PRN
  Administered 2011-09-01: 4 mg via INTRAVENOUS

## 2011-09-01 MED ORDER — 0.9 % SODIUM CHLORIDE (POUR BTL) OPTIME
TOPICAL | Status: DC | PRN
  Administered 2011-09-01: 1000 mL

## 2011-09-01 MED ORDER — ALBUMIN HUMAN 5 % IV SOLN
12.5000 g | Freq: Once | INTRAVENOUS | Status: AC
  Administered 2011-09-01: 12.5 g via INTRAVENOUS

## 2011-09-01 MED ORDER — DEXTROSE 5 % IV SOLN
INTRAVENOUS | Status: AC
  Filled 2011-09-01: qty 50

## 2011-09-01 SURGICAL SUPPLY — 39 items
APL SKNCLS STERI-STRIP NONHPOA (GAUZE/BANDAGES/DRESSINGS) ×1
BENZOIN TINCTURE PRP APPL 2/3 (GAUZE/BANDAGES/DRESSINGS) ×2 IMPLANT
CANISTER SUCTION 2500CC (MISCELLANEOUS) ×2 IMPLANT
CLIP LIGATING EXTRA MED SLVR (CLIP) ×2 IMPLANT
CLIP LIGATING EXTRA SM BLUE (MISCELLANEOUS) ×2 IMPLANT
CLOTH BEACON ORANGE TIMEOUT ST (SAFETY) ×2 IMPLANT
COVER SURGICAL LIGHT HANDLE (MISCELLANEOUS) ×2 IMPLANT
DECANTER SPIKE VIAL GLASS SM (MISCELLANEOUS) ×2 IMPLANT
ELECT REM PT RETURN 9FT ADLT (ELECTROSURGICAL) ×2
ELECTRODE REM PT RTRN 9FT ADLT (ELECTROSURGICAL) ×1 IMPLANT
GEL ULTRASOUND 20GR AQUASONIC (MISCELLANEOUS) IMPLANT
GLOVE BIOGEL M 6.5 STRL (GLOVE) ×2 IMPLANT
GLOVE BIOGEL PI IND STRL 6.5 (GLOVE) IMPLANT
GLOVE BIOGEL PI IND STRL 7.0 (GLOVE) IMPLANT
GLOVE BIOGEL PI INDICATOR 6.5 (GLOVE) ×3
GLOVE BIOGEL PI INDICATOR 7.0 (GLOVE) ×1
GLOVE ECLIPSE 7.0 STRL STRAW (GLOVE) ×1 IMPLANT
GLOVE ORTHOPEDIC STR SZ6.5 (GLOVE) ×1 IMPLANT
GLOVE SS BIOGEL STRL SZ 7.5 (GLOVE) ×1 IMPLANT
GLOVE SUPERSENSE BIOGEL SZ 7.5 (GLOVE) ×1
GLOVE SURG SS PI 6.5 STRL IVOR (GLOVE) ×2 IMPLANT
GOWN STRL NON-REIN LRG LVL3 (GOWN DISPOSABLE) ×8 IMPLANT
GRAFT GORETEX STRT 6X50 (Vascular Products) ×1 IMPLANT
KIT BASIN OR (CUSTOM PROCEDURE TRAY) ×2 IMPLANT
KIT ROOM TURNOVER OR (KITS) ×2 IMPLANT
NS IRRIG 1000ML POUR BTL (IV SOLUTION) ×2 IMPLANT
PACK CV ACCESS (CUSTOM PROCEDURE TRAY) ×2 IMPLANT
PAD ARMBOARD 7.5X6 YLW CONV (MISCELLANEOUS) ×4 IMPLANT
SPONGE GAUZE 4X4 12PLY (GAUZE/BANDAGES/DRESSINGS) ×2 IMPLANT
STRIP CLOSURE SKIN 1/2X4 (GAUZE/BANDAGES/DRESSINGS) ×2 IMPLANT
SUT PROLENE 6 0 CC (SUTURE) ×4 IMPLANT
SUT SILK 2 0 FS (SUTURE) IMPLANT
SUT VIC AB 3-0 SH 27 (SUTURE) ×4
SUT VIC AB 3-0 SH 27X BRD (SUTURE) ×2 IMPLANT
TAPE CLOTH SURG 4X10 WHT LF (GAUZE/BANDAGES/DRESSINGS) ×1 IMPLANT
TOWEL OR 17X24 6PK STRL BLUE (TOWEL DISPOSABLE) ×2 IMPLANT
TOWEL OR 17X26 10 PK STRL BLUE (TOWEL DISPOSABLE) ×2 IMPLANT
UNDERPAD 30X30 INCONTINENT (UNDERPADS AND DIAPERS) ×2 IMPLANT
WATER STERILE IRR 1000ML POUR (IV SOLUTION) ×2 IMPLANT

## 2011-09-01 NOTE — Progress Notes (Signed)
Clinical Social Work-CSW able to secure bed offer in [facility] ([place]) which will allow pt to remain with current dialysis location-CSW notified family who is completing paperwork today-CSW notified treatment team who will place pt on early dialysis schedule tomorrow so that she can d/c early-mid afternoon- Gerre Scull, [phone]

## 2011-09-01 NOTE — Anesthesia Preprocedure Evaluation (Addendum)
Anesthesia Evaluation  Patient identified by MRN, date of birth, ID band Patient awake    Reviewed: Allergy & Precautions, H&P , NPO status , Patient's Chart, lab work & pertinent test results  Airway Mallampati: II      Dental   Pulmonary pneumonia -,  breath sounds clear to auscultation        Cardiovascular hypertension, Pt. on medications Rhythm:Regular Rate:Normal     Neuro/Psych    GI/Hepatic negative GI ROS,   Endo/Other    Renal/GU CRFRenal disease     Musculoskeletal   Abdominal   Peds  Hematology negative hematology ROS (+)   Anesthesia Other Findings   Reproductive/Obstetrics                         Anesthesia Physical Anesthesia Plan  ASA: III  Anesthesia Plan: General   Post-op Pain Management:    Induction: Intravenous  Airway Management Planned: LMA  Additional Equipment:   Intra-op Plan:   Post-operative Plan:   Informed Consent: I have reviewed the patients History and Physical, chart, labs and discussed the procedure including the risks, benefits and alternatives for the proposed anesthesia with the patient or authorized representative who has indicated his/her understanding and acceptance.   Dental advisory given  Plan Discussed with: CRNA, Surgeon and Anesthesiologist  Anesthesia Plan Comments:        Anesthesia Quick Evaluation

## 2011-09-01 NOTE — H&P (View-Only) (Signed)
Subjective: Interval History: none..   Objective: Vital signs in last 24 hours: Temp:  [97.9 F (36.6 C)-99.2 F (37.3 C)] 97.9 F (36.6 C) (08/25 0910) Pulse Rate:  [59-84] 84  (08/25 0910) Resp:  [16-18] 18  (08/25 0910) BP: (90-122)/(59-72) 97/72 mmHg (08/25 0910) SpO2:  [90 %-98 %] 94 % (08/25 0910) Weight:  [111 lb 8.8 oz (50.6 kg)] 111 lb 8.8 oz (50.6 kg) (08/24 2100)  Intake/Output from previous day: 08/24 0701 - 08/25 0700 In: 421.6 [P.O.:300; I.V.:16; IV Piggyback:105.6] Out: 1244  Intake/Output this shift:    No change in her physical exam. Venogram sites looked okay. Remains afebrile.  Lab Results:  Basename 08/30/11 0730 08/29/11 0607  WBC 4.0 3.5*  HGB 9.2* 10.1*  HCT 30.7* 33.0*  PLT 114* 124*   BMET  Basename 08/30/11 0730 08/29/11 0607  NA 137 135  K 4.5 4.3  CL 101 100  CO2 29 26  GLUCOSE 89 79  BUN 16 8  CREATININE 4.57* 3.03*  CALCIUM 9.1 8.9    Studies/Results: Ir Veno/ext/bi  2011-09-05  *RADIOLOGY REPORT*  Clinical Data: End-stage renal disease, venous access planning for new access, current access is a femoral tunneled catheter  ULTRASOUND GUIDANCE VASCULAR ACCESS BILATERAL UPPER EXTREMITY AND CENTRAL VENOGRAMS  Date:  Sep 05, 2011 09:50:00  Radiologist:  M. Daryll Brod, M.D.  Medications:  1% lidocaine locally  Guidance:  Ultrasound fluoroscopic  Fluoroscopy time:  0.8 minutes  Sedation time:  None.  Contrast volume:  40 ml [id number]  Complications:  No immediate  PROCEDURE/FINDINGS:  Informed consent was obtained from the patient following explanation of the procedure, risks, benefits and alternatives. The patient understands, agrees and consents for the procedure. All questions were addressed.  A time out was performed.  Maximal barrier sterile technique utilized including caps, mask, sterile gowns, sterile gloves, large sterile drape, hand hygiene, and Chloroprep  Under sterile conditions and local anesthesia, micropuncture access was  performed with ultrasound of the left upper extremity brachial vein above the elbow.  Guide wire was advanced followed by the 4- [place] dilator.  Dilator remains in place for contrast injection to perform venography.  In a similar fashion, the right brachial vein was accessed with a micropuncture needle utilizing direct ultrasound.  This was also done under sterile conditions.  Guide wire advanced followed by a micro dilator.  Dilator remains for contrast injection for a right upper extremity venogram  Left upper extremity:  Small caliber recanalized brachial veins evident in the upper arm.  Axillary vein is patent more centrally. Within the chest, the subclavian vein is patent with a focal stenosis at the distal clavicle and first rib.  Contrast eventually passes through this area to opacified a small caliber narrowed left innominate vein.  Jugular paraspinal collaterals noted.  SVC patent.  Right upper extremity:  Right brachial and axillary veins are patent peripherally.  Right subclavian vein also demonstrates a focal stenosis at the distal clavicle first rib however this is not significantly flow limiting as contrast easily passes through into a patent right innominate vein and SVC.  IMPRESSION: Upper extremity venous anatomy as described.  The right upper extremity peripheral and central veins appear more favorable for dialysis access than the left side.   Original Report Authenticated By: Jerilynn Mages. Daryll Brod, M.D.    Ir US Guide Vasc Access Left  2011-09-05  *RADIOLOGY REPORT*  Clinical Data: End-stage renal disease, venous access planning for new access, current access is a femoral tunneled catheter  ULTRASOUND  GUIDANCE VASCULAR ACCESS BILATERAL UPPER EXTREMITY AND CENTRAL VENOGRAMS  Date:  08/28/2011 09:50:00  Radiologist:  M. Daryll Brod, M.D.  Medications:  1% lidocaine locally  Guidance:  Ultrasound fluoroscopic  Fluoroscopy time:  0.8 minutes  Sedation time:  None.  Contrast volume:  40 ml [id number]   Complications:  No immediate  PROCEDURE/FINDINGS:  Informed consent was obtained from the patient following explanation of the procedure, risks, benefits and alternatives. The patient understands, agrees and consents for the procedure. All questions were addressed.  A time out was performed.  Maximal barrier sterile technique utilized including caps, mask, sterile gowns, sterile gloves, large sterile drape, hand hygiene, and Chloroprep  Under sterile conditions and local anesthesia, micropuncture access was performed with ultrasound of the left upper extremity brachial vein above the elbow.  Guide wire was advanced followed by the 4- [place] dilator.  Dilator remains in place for contrast injection to perform venography.  In a similar fashion, the right brachial vein was accessed with a micropuncture needle utilizing direct ultrasound.  This was also done under sterile conditions.  Guide wire advanced followed by a micro dilator.  Dilator remains for contrast injection for a right upper extremity venogram  Left upper extremity:  Small caliber recanalized brachial veins evident in the upper arm.  Axillary vein is patent more centrally. Within the chest, the subclavian vein is patent with a focal stenosis at the distal clavicle and first rib.  Contrast eventually passes through this area to opacified a small caliber narrowed left innominate vein.  Jugular paraspinal collaterals noted.  SVC patent.  Right upper extremity:  Right brachial and axillary veins are patent peripherally.  Right subclavian vein also demonstrates a focal stenosis at the distal clavicle first rib however this is not significantly flow limiting as contrast easily passes through into a patent right innominate vein and SVC.  IMPRESSION: Upper extremity venous anatomy as described.  The right upper extremity peripheral and central veins appear more favorable for dialysis access than the left side.   Original Report Authenticated By: Jerilynn Mages. Daryll Brod,  M.D.    Ir US Guide Vasc Access Right  08/28/2011  *RADIOLOGY REPORT*  Clinical Data: End-stage renal disease, venous access planning for new access, current access is a femoral tunneled catheter  ULTRASOUND GUIDANCE VASCULAR ACCESS BILATERAL UPPER EXTREMITY AND CENTRAL VENOGRAMS  Date:  08/28/2011 09:50:00  Radiologist:  M. Daryll Brod, M.D.  Medications:  1% lidocaine locally  Guidance:  Ultrasound fluoroscopic  Fluoroscopy time:  0.8 minutes  Sedation time:  None.  Contrast volume:  40 ml [id number]  Complications:  No immediate  PROCEDURE/FINDINGS:  Informed consent was obtained from the patient following explanation of the procedure, risks, benefits and alternatives. The patient understands, agrees and consents for the procedure. All questions were addressed.  A time out was performed.  Maximal barrier sterile technique utilized including caps, mask, sterile gowns, sterile gloves, large sterile drape, hand hygiene, and Chloroprep  Under sterile conditions and local anesthesia, micropuncture access was performed with ultrasound of the left upper extremity brachial vein above the elbow.  Guide wire was advanced followed by the 4- [place] dilator.  Dilator remains in place for contrast injection to perform venography.  In a similar fashion, the right brachial vein was accessed with a micropuncture needle utilizing direct ultrasound.  This was also done under sterile conditions.  Guide wire advanced followed by a micro dilator.  Dilator remains for contrast injection for a right upper extremity venogram  Left upper  extremity:  Small caliber recanalized brachial veins evident in the upper arm.  Axillary vein is patent more centrally. Within the chest, the subclavian vein is patent with a focal stenosis at the distal clavicle and first rib.  Contrast eventually passes through this area to opacified a small caliber narrowed left innominate vein.  Jugular paraspinal collaterals noted.  SVC patent.  Right upper  extremity:  Right brachial and axillary veins are patent peripherally.  Right subclavian vein also demonstrates a focal stenosis at the distal clavicle first rib however this is not significantly flow limiting as contrast easily passes through into a patent right innominate vein and SVC.  IMPRESSION: Upper extremity venous anatomy as described.  The right upper extremity peripheral and central veins appear more favorable for dialysis access than the left side.   Original Report Authenticated By: Jerilynn Mages. Daryll Brod, M.D.    Dg Chest Port 1 View  08/26/2011  *RADIOLOGY REPORT*  Clinical Data: Right femoral Diatek catheter placement  PORTABLE CHEST - 1 VIEW  Comparison: 07/11/2011  Findings: Femoral catheter has been placed with tips in the right atrium and SVC.  Right jugular catheter has been removed since the prior study.  Cardiac enlargement.  Normal vascularity.  No edema or effusion.  IMPRESSION: Femoral catheter tips in the right atrium and SVC.  Lungs are clear.   Original Report Authenticated By: Truett Perna, M.D.    Dg Abd Acute W/chest  08/22/2011  *RADIOLOGY REPORT*  Clinical Data: Abdominal pain  ACUTE ABDOMEN SERIES (ABDOMEN 2 VIEW & CHEST 1 VIEW)  Comparison: 07/11/2011 and earlier studies  Findings: Right IJ tunneled hemodialysis catheter extends to the proximal right atrium.  Stable mild cardiomegaly.  Vascular clips in the left axilla.  Lungs are clear.  No effusion.  No free air. Nonobstructive bowel gas pattern.  Left pelvic phlebolith. Regional bones unremarkable.  IMPRESSION:  1.  Stable mild cardiomegaly. 2.  Normal bowel gas pattern.  No free air.  Original Report Authenticated By: Trecia Rogers, M.D.   Anti-infectives: Anti-infectives     Start     Dose/Rate Route Frequency Ordered Stop   08/30/11 2200   DAPTOmycin (CUBICIN) 282 mg in sodium chloride 0.9 % IVPB        6 mg/kg  47 kg (Order-Specific) 211.3 mL/hr over 30 Minutes Intravenous Once per day on Tue Thu Sat  08/28/11 1251     08/28/11 2200   DAPTOmycin (CUBICIN) 282 mg in sodium chloride 0.9 % IVPB  Status:  Discontinued        6 mg/kg  47 kg (Order-Specific) 211.3 mL/hr over 30 Minutes Intravenous Once per day on Tue Thu Sat 08/28/11 1250 08/28/11 1251   08/28/11 2200   DAPTOmycin (CUBICIN) 282 mg in sodium chloride 0.9 % IVPB        6 mg/kg  47 kg (Order-Specific) 211.3 mL/hr over 30 Minutes Intravenous  Once 08/28/11 1254 08/28/11 2245   08/28/11 1400   DAPTOmycin (CUBICIN) 282 mg in sodium chloride 0.9 % IVPB  Status:  Discontinued        6 mg/kg  47 kg (Order-Specific) 211.3 mL/hr over 30 Minutes Intravenous  Once 08/28/11 1250 08/28/11 1254   08/27/11 1000   stavudine (ZERIT) capsule 15 mg        15 mg Oral Daily 08/26/11 1308     08/26/11 0600   cefUROXime (ZINACEF) 1.5 g in dextrose 5 % 50 mL IVPB        1.5  g 100 mL/hr over 30 Minutes Intravenous On call to O.R. 08/25/11 1453 08/26/11 1429   08/24/11 1500   linezolid (ZYVOX) IVPB 600 mg  Status:  Discontinued        600 mg 300 mL/hr over 60 Minutes Intravenous Every 12 hours 08/24/11 1418 08/28/11 1135   08/23/11 1200   vancomycin (VANCOCIN) 500 mg in sodium chloride 0.9 % 100 mL IVPB  Status:  Discontinued        500 mg 100 mL/hr over 60 Minutes Intravenous Every T-Th-Sa (Hemodialysis) 08/22/11 2326 08/24/11 1418   08/22/11 2359   cefTRIAXone (ROCEPHIN) 1 g in dextrose 5 % 50 mL IVPB  Status:  Discontinued        1 g 100 mL/hr over 30 Minutes Intravenous Every 24 hours 08/22/11 2326 08/24/11 1418   08/22/11 2330   vancomycin (VANCOCIN) IVPB 1000 mg/200 mL premix        1,000 mg 200 mL/hr over 60 Minutes Intravenous NOW 08/22/11 2326 08/23/11 0050   08/22/11 1530   vancomycin (VANCOCIN) 50 mg/mL oral solution 125 mg        125 mg Oral 4 times daily 08/22/11 1519 09/05/11 1359   08/22/11 1000   lamiVUDine (EPIVIR) 10 MG/ML solution 50 mg        50 mg Oral Daily 08/22/11 0509     08/22/11 1000   Stavudine SOLR 20 mg   Status:  Discontinued        20 mL Oral Daily 08/22/11 0509 08/22/11 0520   08/22/11 1000   stavudine (ZERIT) capsule 20 mg  Status:  Discontinued        20 mg Oral Daily 08/22/11 0521 08/26/11 1308   08/22/11 0800   Darunavir Ethanolate (PREZISTA) tablet 800 mg        800 mg Oral Daily with breakfast 08/22/11 0509     08/22/11 0800   ritonavir (NORVIR) tablet 100 mg        100 mg Oral Daily with breakfast 08/22/11 0509            Assessment/Plan: s/p Procedure(s) (LRB): TRANSESOPHAGEAL ECHOCARDIOGRAM (TEE) (N/A) Again discussed plan for elective right upper arm AV Gore-Tex graft tomorrow. She will be n.p.o. for surgery.   LOS: 10 days   Kennth Vanbenschoten 08/31/2011, 11:46 AM

## 2011-09-01 NOTE — Progress Notes (Signed)
Visited pt after RUE AVgraft placement. She c/o 8-9/10 pain. She has pain med Rx'ed informed the RN

## 2011-09-01 NOTE — Progress Notes (Signed)
Physical Therapy Treatment Patient Details Name: [name] MRN: [id number] DOB: 29-May-1955 Today's Date: 09/01/2011 Time: [id number] PT Time Calculation (min): 20 min  PT Assessment / Plan / Recommendation Comments on Treatment Session  VSS during amb, and pt asymptomatic for dizziness    Follow Up Recommendations  Skilled nursing facility    Barriers to Discharge        Equipment Recommendations  Defer to next venue    Recommendations for Other Services    Frequency Min 3X/week   Plan Discharge plan remains appropriate    Precautions / Restrictions Precautions Precautions: Fall Restrictions Weight Bearing Restrictions: No   Pertinent Vitals/Pain BP 109/72 post amb; HR in the 90s throughout amb    Mobility  Bed Mobility Bed Mobility: Supine to Sit;Sitting - Scoot to Edge of Bed;Sit to Supine Supine to Sit: 5: Supervision Sitting - Scoot to Edge of Bed: 5: Supervision Sit to Supine: 4: Min guard Details for Bed Mobility Assistance: cues for hand placement and safety Transfers Transfers: Sit to Stand;Stand to Sit Sit to Stand: 5: Supervision;With upper extremity assist;From chair/3-in-1 Stand to Sit: 5: Supervision;With armrests;To chair/3-in-1 Details for Transfer Assistance: vc's as reminder for hand placement, otherwise good technique Ambulation/Gait Ambulation/Gait Assistance: 4: Min guard (wothout physical contact) Ambulation Distance (Feet): 200 Feet Assistive device: Rolling walker Ambulation/Gait Assistance Details: Cues for self-monitor for dizziness throughout session; Asymptomatic for dizziness; Good improvements with activity tol Gait Pattern: Step-through pattern    Exercises     PT Diagnosis:    PT Problem List:   PT Treatment Interventions:     PT Goals Acute Rehab PT Goals Time For Goal Achievement: 09/08/11 Potential to Achieve Goals: Good Pt will go Supine/Side to Sit: with modified independence PT Goal: Supine/Side to Sit - Progress:  Progressing toward goal Pt will go Sit to Supine/Side: with modified independence PT Goal: Sit to Supine/Side - Progress: Progressing toward goal Pt will go Sit to Stand: with modified independence PT Goal: Sit to Stand - Progress: Progressing toward goal Pt will go Stand to Sit: with modified independence PT Goal: Stand to Sit - Progress: Progressing toward goal Pt will Ambulate: >150 feet;with modified independence;with rolling walker PT Goal: Ambulate - Progress: Progressing toward goal  Visit Information  Last PT Received On: 09/01/11 Assistance Needed: +1    Subjective Data  Subjective: Seems glad to incr amb Patient Stated Goal: feel better   Cognition  Overall Cognitive Status: Appears within functional limits for tasks assessed/performed Arousal/Alertness: Awake/alert Orientation Level: Appears intact for tasks assessed Behavior During Session: [facility] for tasks performed    Balance     End of Session PT - End of Session Activity Tolerance: Patient tolerated treatment well Patient left: in bed;with call bell/phone within reach (awaiting AVG placement)   GP     Roney Marion [facility] [place], [place]  09/01/2011, 10:38 AM

## 2011-09-01 NOTE — Progress Notes (Signed)
Subjective: Pt evaluated in the am before procedure she denies complaints this am.    Objective: Vital signs in last 24 hours: Filed Vitals:   09/01/11 1512 09/01/11 1515 09/01/11 1518 09/01/11 1548  BP: 100/56   90/55  Pulse:  63 65 68  Temp:    98.7 F (37.1 C)  TempSrc:    Oral  Resp:  14 16 16   Height:      Weight:      SpO2:  100% 100% 98%   Weight change: -9 lb 0.6 oz (-4.1 kg)  Intake/Output Summary (Last 24 hours) at 09/01/11 1656 Last data filed at 09/01/11 1500  Gross per 24 hour  Intake   1230 ml  Output      0 ml  Net   1230 ml   Vitals reviewed. General: resting in bed, NAD, cachetic  HEENT: PERRL, no scleral icterus Cardiac: +systolic murmer, no rubs, murmurs or gallops Pulm: clear to auscultation bilaterally, no wheezes, rales, or rhonchi Abd: soft, nontender, nondistended, BS present Ext: warm and well perfused, no pedal edema, hyperpigmented skin changes to lower ext b/l Neuro: alert and oriented X3 Skin: hyperpigmented skin changes to back  Lab Results: Basic Metabolic Panel:  Lab Q000111Q 0524 08/30/11 0730  NA 135 137  K 5.0 4.5  CL 101 101  CO2 30 29  GLUCOSE 79 89  BUN 18 16  CREATININE 4.88* 4.57*  CALCIUM 9.5 9.1  MG -- --  PHOS 2.4 3.1   Liver Function Tests:  Lab 09/01/11 0524 08/30/11 0730  AST -- --  ALT -- --  ALKPHOS -- --  BILITOT -- --  PROT -- --  ALBUMIN 2.1* 2.2*   CBC:  Lab 09/01/11 0524 08/30/11 0730 08/29/11 0607  WBC 4.9 4.0 --  NEUTROABS -- 2.2 2.3  HGB 9.6* 9.2* --  HCT 31.3* 30.7* --  MCV 103.0* 103.0* --  PLT 98* 114* --   CBG:  Lab 08/26/11 1137  GLUCAP 85     Misc. Labs: no recent  Micro Results: Recent Results (from the past 240 hour(s))  CULTURE, BLOOD (ROUTINE X 2)     Status: Normal   Collection Time   08/22/11 11:15 PM      Component Value Range Status Comment   Specimen Description BLOOD RIGHT ARM   Final    Special Requests BOTTLES DRAWN AEROBIC AND ANAEROBIC 10CC   Final    Culture  Setup Time 08/23/2011 05:11   Final    Culture     Final    Value: ENTEROCOCCUS SPECIES     Note: SUSCEPTIBILITIES PERFORMED ON PREVIOUS CULTURE WITHIN THE LAST 5 DAYS.     Note: Gram Stain Report Called to,Read Back By and Verified With: RN K. GENGLER ON 08/23/11 AT 2145 BY DTERRY   Report Status 08/25/2011 FINAL   Final   CULTURE, BLOOD (ROUTINE X 2)     Status: Normal   Collection Time   08/22/11 11:20 PM      Component Value Range Status Comment   Specimen Description BLOOD RIGHT ARM   Final    Special Requests BOTTLES DRAWN AEROBIC AND ANAEROBIC 10CC   Final    Culture  Setup Time 08/23/2011 05:11   Final    Culture     Final    Value: ENTEROCOCCUS SPECIES     Note: SUSCEPTIBILITIES PERFORMED ON PREVIOUS CULTURE WITHIN THE LAST 5 DAYS.     Note: Gram Stain Report Called to,Read Back By and  Verified With: RN K. GENGLER ON 08/23/11 AT 2145 BY DTERRY   Report Status 08/25/2011 FINAL   Final   CATH TIP CULTURE     Status: Normal   Collection Time   08/23/11  2:19 PM      Component Value Range Status Comment   Specimen Description CATH TIP   Final    Special Requests NONE   Final    Culture     Final    Value: 80 COLONIES VANCOMYCIN RESISTANT ENTEROCOCCUS ISOLATED     Note: RVB TINA O 08/27/11@830  BY REAMM   Report Status 08/27/2011 FINAL   Final    Organism ID, Bacteria VANCOMYCIN RESISTANT ENTEROCOCCUS ISOLATED   Final   CULTURE, BLOOD (ROUTINE X 2)     Status: Normal   Collection Time   08/26/11  7:55 AM      Component Value Range Status Comment   Specimen Description BLOOD HAND LEFT   Final    Special Requests BOTTLES DRAWN AEROBIC ONLY 10CC   Final    Culture  Setup Time 08/26/2011 15:00   Final    Culture NO GROWTH 5 DAYS   Final    Report Status 09/01/2011 FINAL   Final   CULTURE, BLOOD (ROUTINE X 2)     Status: Normal   Collection Time   08/26/11  8:05 AM      Component Value Range Status Comment   Specimen Description BLOOD LEFT FOREARM   Final    Special  Requests BOTTLES DRAWN AEROBIC AND ANAEROBIC 10CC EACH   Final    Culture  Setup Time 08/26/2011 15:01   Final    Culture NO GROWTH 5 DAYS   Final    Report Status 09/01/2011 FINAL   Final    Studies/Results: No results found.  Medications:  Scheduled Meds:    . albumin human  12.5 g Intravenous Once  . albumin human      . aspirin  81 mg Oral Daily  . DAPTOmycin (CUBICIN)  IV  6 mg/kg (Order-Specific) Intravenous Custom  . darbepoetin  100 mcg Intravenous Q Sat-HD  . darunavir  800 mg Oral Q breakfast  . feeding supplement  237 mL Oral BID BM  . lamiVUDine  50 mg Oral Daily  . midodrine  10 mg Oral BID WC  . multivitamin  1 tablet Oral QHS  . pantoprazole  20 mg Oral Q1200  . paricalcitol  4 mcg Intravenous Q T,Th,Sa-HD  . ritonavir  100 mg Oral Q breakfast  . sodium chloride  3 mL Intravenous Q12H  . stavudine  15 mg Oral Daily  . vancomycin  125 mg Oral QID   Continuous Infusions:    . sodium chloride 20 mL/hr at 09/01/11 1112   PRN Meds:.sodium chloride, sodium chloride, acetaminophen, heparin, heparin, heparin, heparin, HYDROmorphone (DILAUDID) injection, lidocaine, lidocaine-prilocaine, ondansetron (ZOFRAN) IV, ondansetron, oxyCODONE-acetaminophen, pentafluoroprop-tetrafluoroeth, DISCONTD: 0.9 % irrigation (POUR BTL), DISCONTD: heparin 6000 unit irrigation  Assessment/Plan:   56 y.o PMH HIV, HTN, ESRD on HD TTHSat, h/o bacteriuria with VRE 03/2010, h/o MSSA and coag neg staph bacteremia and potential endocarditis 2/2 left IJ infection 05/2011, h/o C. Diff (07/2011), MGUS, h/o  anemia, h/o pancytopenia/thrombocytopenia with EF 40-45% prior to admission.  She presented 8/16 with fever and ab pain.    1. Bacteremia -4 blood Cxs aerobic/ anaerobic +VRE and previous cath tip +enterococcus 80K colonies  -old HD cath removed 8/17 d/t HD related bacteremia  Plan -repeat blood cultures 8/20 no growth  final -cont Daptomycin (8/22) dosed q48 with HD x 2 weeks per Dr. Tommy Medal  opinion 8/26 -pt has outpt f/u with Dr. Tommy Medal 09/11/11 and he will repeat blood cultures at that time and determine future tx  -renal and VVS following pt   2. C.diff -still with diarrhea  -isolation precautions and protocol -Rx oral Vancomycin 125 mg qid (since 8/16) will tx for duration of abx therapy (2 weeks) for bacteremia plus 2 weeks after discontinue abx for bacteremia   3. Bradycardia  -pt is asymptomatic, HR 56 this am  -will monitor, NTD currently   4. Ischemic and Nonischemic cardiomyopathy -EF 35-40% -medical management: avoid BB d/t bradycardia but can try ACEI or ARB when bp can tolerate; also rec vasodilator tx if bp improves -will schedule outpt cards f/u at d/c   5. History HTN with hypotension this admission -intermittently resolved  -BP 149/87 this am  -still cont hold home Lopressor  -Cont Midodrine 10 mg bid  -will monitor bp -careful with IVF boluses 2/2 #4  6. HIV INFECTION  -repeat cd 4 ct 8/20 was 310; HIV genotype pending -home meds (Norvir, Epivir, Prezista, Zerit) -no PCP px with Bactrim 3x per week per ID Dr. Tommy Medal as of now -ID following and pt will have outpt ID f/u 09/11/11  7. ESRD (end stage renal disease) (03/19/2007) -renal consulted and following -Cr. baseline 3-4 range  -HD access chest pulled after dialysis 8/17 d/t bacteremia -new diatek cath placed right fem vein on 8/20 by VVS -HD TTHSat  -VVS placed new RUE AVgraft today pt needs pain control for new AVgraft   8. ANEMIA OF CHRONIC DISEASE -Aransep -pending anemia panel   9. 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 DVT/GIPx -scds -protonix   11. F/E/N -renal following electrolytes -diet ordered   12.  Dispo -d/c possibly 8/27 or 8/28 if ok with renal and VVS  -social work has placement at [facility] and setting up outpt HD center.    Code status: full   LOS: 11 days   Cresenciano Genre [id number] 09/01/2011, 4:56 PM

## 2011-09-01 NOTE — Progress Notes (Signed)
Pt returned from OR after having a fistula placed in her rt arm. Pt alert and oriented. Denies any pain. Dressings dry and intact. VS stable. Will cont to monitor.

## 2011-09-01 NOTE — Progress Notes (Signed)
Subjective:  No cos except for diarrhea. Tolerated meals yesterday. Npo for avgg this am  Objective Vital signs in last 24 hours: Filed Vitals:   08/31/11 1731 08/31/11 2104 09/01/11 0547 09/01/11 0850  BP: 114/76 135/67 149/87 123/74  Pulse: 71 68 56 68  Temp: 97.7 F (36.5 C) 98.3 F (36.8 C) 98.4 F (36.9 C) 98.6 F (37 C)  TempSrc: Oral Oral Oral Oral  Resp: 18 18 16 18   Height:      Weight:   50.3 kg (110 lb 14.3 oz)   SpO2: 96% 95% 97% 95%   Weight change: -4.1 kg (-9 lb 0.6 oz)  Intake/Output Summary (Last 24 hours) at 09/01/11 1051 Last data filed at 09/01/11 0850  Gross per 24 hour  Intake    480 ml  Output      0 ml  Net    480 ml   Labs: Basic Metabolic Panel:  Lab Q000111Q 0524 08/30/11 0730 08/29/11 0607  NA 135 137 135  K 5.0 4.5 4.3  CL 101 101 100  CO2 30 29 26   GLUCOSE 79 89 79  BUN 18 16 8   CREATININE 4.88* 4.57* 3.03*  CALCIUM 9.5 9.1 8.9  ALB -- -- --  PHOS 2.4 3.1 2.6   Liver Function Tests:  Lab 09/01/11 0524 08/30/11 0730 08/29/11 0607  AST -- -- --  ALT -- -- --  ALKPHOS -- -- --  BILITOT -- -- --  PROT -- -- --  ALBUMIN 2.1* 2.2* 2.3*   No results found for this basename: LIPASE:3,AMYLASE:3 in the last 168 hours No results found for this basename: AMMONIA:3 in the last 168 hours CBC:  Lab 09/01/11 0524 08/30/11 0730 08/29/11 0607 08/28/11 0600 08/27/11 0635  WBC 4.9 4.0 3.5* -- --  NEUTROABS -- 2.2 2.3 2.0 --  HGB 9.6* 9.2* 10.1* -- --  HCT 31.3* 30.7* 33.0* -- --  MCV 103.0* 103.0* 101.5* 101.3* 99.1  PLT 98* 114* 124* -- --   Cardiac Enzymes:  Lab 08/29/11 0607  CKTOTAL 63  CKMB --  CKMBINDEX --  TROPONINI --   CBG:  Lab 08/26/11 1137  GLUCAP 85    Iron Studies: No results found for this basename: IRON,TIBC,TRANSFERRIN,FERRITIN in the last 72 hours Studies/Results: No results found. Medications:      . aspirin  81 mg Oral Daily  . DAPTOmycin (CUBICIN)  IV  6 mg/kg (Order-Specific) Intravenous Custom  .  darbepoetin  100 mcg Intravenous Q Sat-HD  . darunavir  800 mg Oral Q breakfast  . feeding supplement  237 mL Oral BID BM  . lamiVUDine  50 mg Oral Daily  . midodrine  10 mg Oral BID WC  . multivitamin  1 tablet Oral QHS  . pantoprazole  20 mg Oral Q1200  . paricalcitol  4 mcg Intravenous Q T,Th,Sa-HD  . ritonavir  100 mg Oral Q breakfast  . sodium chloride  3 mL Intravenous Q12H  . stavudine  15 mg Oral Daily  . vancomycin  125 mg Oral QID   I  have reviewed scheduled and prn medications.  Physical Exam:  General: Alert NAD  Heart: RRR, 2/6 hsm apex  Lungs: Slightly decreased bs at bases  Abdomen: bs pos. Soft non tender. Extremities: Dialysis Access=:left pedal edema 1+ and no right pedal edema/ right femoral perm cath     Outpatient HD: [facility], 4 hrs, 54 kg EDW. Hep 5900u. EPO 13,000u. Zemplar 4ug. R IJ TDcath. Nepro 1 can each  HD. 2K/2.25Ca bath   Problem/Plan:  1 [facility]F =( [facility]vol ok noted Dr. Donnetta Hutching plans =right upper arm AV Gore-Tex graft / 5.o k and Follow labs closely With her being on Regular Diet.  2 Anemia hgb 9.6 on Aranesp 173mcg  3. HTN / VOL. = controlled on Midodrine 10mg  bid and vol off with hd/ 4 HPTH stable on vit d , no binders, phos 2.4 ca 9.5 with alb 2.1 moniter On REG. DIET  5 HIV = meds per id/ Admit team  6 Sepsis VRE switched to Dapto, I [facility] have [facility].  7 Dispo =   As discussed with TS =" [facility] is [facility] and Will still be with [facility]"   Ernest Haber, PA-C [place] [phone] 09/01/2011,10:51 AM  LOS: 11 days   Patient seen and examined and agree with assessment and plan as above.  Kelly Splinter  MD [employer] [phone] pgr    [phone] cell 09/01/2011, 11:52 AM

## 2011-09-01 NOTE — Progress Notes (Signed)
Subjective: Pt evaluated in the am before procedure she denies complaints this am.    Objective: Vital signs in last 24 hours: Filed Vitals:   09/01/11 1512 09/01/11 1515 09/01/11 1518 09/01/11 1548  BP: 100/56   90/55  Pulse:  63 65 68  Temp:    98.7 F (37.1 C)  TempSrc:    Oral  Resp:  14 16 16   Height:      Weight:      SpO2:  100% 100% 98%   Weight change: -9 lb 0.6 oz (-4.1 kg)  Intake/Output Summary (Last 24 hours) at 09/01/11 1710 Last data filed at 09/01/11 1500  Gross per 24 hour  Intake   1230 ml  Output      0 ml  Net   1230 ml   Vitals reviewed. General: resting in bed, NAD, cachetic  HEENT: PERRL, no scleral icterus Cardiac: +systolic murmer, no rubs, murmurs or gallops Pulm: clear to auscultation bilaterally, no wheezes, rales, or rhonchi Abd: soft, nontender, nondistended, BS present Ext: warm and well perfused, no pedal edema, hyperpigmented skin changes to lower ext b/l Neuro: alert and oriented X3 Skin: hyperpigmented skin changes to back  Lab Results: Basic Metabolic Panel:  Lab Q000111Q 0524 08/30/11 0730  NA 135 137  K 5.0 4.5  CL 101 101  CO2 30 29  GLUCOSE 79 89  BUN 18 16  CREATININE 4.88* 4.57*  CALCIUM 9.5 9.1  MG -- --  PHOS 2.4 3.1   Liver Function Tests:  Lab 09/01/11 0524 08/30/11 0730  AST -- --  ALT -- --  ALKPHOS -- --  BILITOT -- --  PROT -- --  ALBUMIN 2.1* 2.2*   CBC:  Lab 09/01/11 0524 08/30/11 0730 08/29/11 0607  WBC 4.9 4.0 --  NEUTROABS -- 2.2 2.3  HGB 9.6* 9.2* --  HCT 31.3* 30.7* --  MCV 103.0* 103.0* --  PLT 98* 114* --   CBG:  Lab 08/26/11 1137  GLUCAP 85     Misc. Labs: no recent  Micro Results: Recent Results (from the past 240 hour(s))  CULTURE, BLOOD (ROUTINE X 2)     Status: Normal   Collection Time   08/22/11 11:15 PM      Component Value Range Status Comment   Specimen Description BLOOD RIGHT ARM   Final    Special Requests BOTTLES DRAWN AEROBIC AND ANAEROBIC 10CC   Final    Culture  Setup Time 08/23/2011 05:11   Final    Culture     Final    Value: ENTEROCOCCUS SPECIES     Note: SUSCEPTIBILITIES PERFORMED ON PREVIOUS CULTURE WITHIN THE LAST 5 DAYS.     Note: Gram Stain Report Called to,Read Back By and Verified With: RN K. GENGLER ON 08/23/11 AT 2145 BY DTERRY   Report Status 08/25/2011 FINAL   Final   CULTURE, BLOOD (ROUTINE X 2)     Status: Normal   Collection Time   08/22/11 11:20 PM      Component Value Range Status Comment   Specimen Description BLOOD RIGHT ARM   Final    Special Requests BOTTLES DRAWN AEROBIC AND ANAEROBIC 10CC   Final    Culture  Setup Time 08/23/2011 05:11   Final    Culture     Final    Value: ENTEROCOCCUS SPECIES     Note: SUSCEPTIBILITIES PERFORMED ON PREVIOUS CULTURE WITHIN THE LAST 5 DAYS.     Note: Gram Stain Report Called to,Read Back By and  Verified With: RN K. GENGLER ON 08/23/11 AT 2145 BY DTERRY   Report Status 08/25/2011 FINAL   Final   CATH TIP CULTURE     Status: Normal   Collection Time   08/23/11  2:19 PM      Component Value Range Status Comment   Specimen Description CATH TIP   Final    Special Requests NONE   Final    Culture     Final    Value: 80 COLONIES VANCOMYCIN RESISTANT ENTEROCOCCUS ISOLATED     Note: RVB TINA O 08/27/11@830  BY REAMM   Report Status 08/27/2011 FINAL   Final    Organism ID, Bacteria VANCOMYCIN RESISTANT ENTEROCOCCUS ISOLATED   Final   CULTURE, BLOOD (ROUTINE X 2)     Status: Normal   Collection Time   08/26/11  7:55 AM      Component Value Range Status Comment   Specimen Description BLOOD HAND LEFT   Final    Special Requests BOTTLES DRAWN AEROBIC ONLY 10CC   Final    Culture  Setup Time 08/26/2011 15:00   Final    Culture NO GROWTH 5 DAYS   Final    Report Status 09/01/2011 FINAL   Final   CULTURE, BLOOD (ROUTINE X 2)     Status: Normal   Collection Time   08/26/11  8:05 AM      Component Value Range Status Comment   Specimen Description BLOOD LEFT FOREARM   Final    Special  Requests BOTTLES DRAWN AEROBIC AND ANAEROBIC 10CC EACH   Final    Culture  Setup Time 08/26/2011 15:01   Final    Culture NO GROWTH 5 DAYS   Final    Report Status 09/01/2011 FINAL   Final    Studies/Results: No results found.  Medications:  Scheduled Meds:    . albumin human  12.5 g Intravenous Once  . albumin human      . aspirin  81 mg Oral Daily  . DAPTOmycin (CUBICIN)  IV  6 mg/kg (Order-Specific) Intravenous Custom  . darbepoetin  100 mcg Intravenous Q Sat-HD  . darunavir  800 mg Oral Q breakfast  . feeding supplement  237 mL Oral BID BM  . lamiVUDine  50 mg Oral Daily  . midodrine  10 mg Oral BID WC  . multivitamin  1 tablet Oral QHS  . pantoprazole  20 mg Oral Q1200  . paricalcitol  4 mcg Intravenous Q T,Th,Sa-HD  . ritonavir  100 mg Oral Q breakfast  . sodium chloride  3 mL Intravenous Q12H  . stavudine  15 mg Oral Daily  . vancomycin  125 mg Oral QID   Continuous Infusions:    . sodium chloride 20 mL/hr at 09/01/11 1112   PRN Meds:.sodium chloride, sodium chloride, acetaminophen, heparin, heparin, heparin, heparin, HYDROmorphone (DILAUDID) injection, lidocaine, lidocaine-prilocaine, ondansetron (ZOFRAN) IV, ondansetron, oxyCODONE-acetaminophen, pentafluoroprop-tetrafluoroeth, DISCONTD: 0.9 % irrigation (POUR BTL), DISCONTD: heparin 6000 unit irrigation  Assessment/Plan:   56 y.o PMH HIV, HTN, ESRD on HD TTHSat, h/o bacteriuria with VRE 03/2010, h/o MSSA and coag neg staph bacteremia and potential endocarditis 2/2 left IJ infection 05/2011, h/o C. Diff (07/2011), MGUS, h/o  anemia, h/o pancytopenia/thrombocytopenia with EF 40-45% prior to admission.  She presented 8/16 with fever and ab pain.    1. Bacteremia -4 blood Cxs aerobic/ anaerobic +VRE and previous cath tip +enterococcus 80K colonies  -old HD cath removed 8/17 d/t HD related bacteremia  Plan -repeat blood cultures 8/20 no growth  final -cont Daptomycin (8/22) dosed q48 with HD x 2 weeks per Dr. Tommy Medal  opinion 8/26 -pt has outpt f/u with Dr. Tommy Medal 09/11/11 and he will repeat blood cultures at that time and determine future tx  -renal and VVS following pt   2. C.diff -still with diarrhea  -isolation precautions and protocol -Rx oral Vancomycin 125 mg qid (since 8/16) will tx for duration of abx therapy (2 weeks) for bacteremia plus 2 weeks after discontinue abx for bacteremia   3. Bradycardia  -pt is asymptomatic, HR 56 this am  -will monitor, NTD currently   4. Ischemic and Nonischemic cardiomyopathy -EF 35-40% -medical management: avoid BB d/t bradycardia but can try ACEI or ARB when bp can tolerate; also rec vasodilator tx if bp improves -will schedule outpt cards f/u at d/c   5. History HTN with hypotension this admission -intermittently resolved  -BP 149/87 this am  -still cont hold home Lopressor  -Cont Midodrine 10 mg bid  -will monitor bp -careful with IVF boluses 2/2 #4  6. HIV INFECTION  -repeat cd 4 ct 8/20 was 310; HIV genotype pending -home meds (Norvir, Epivir, Prezista, Zerit) -no PCP px with Bactrim 3x per week per ID Dr. Tommy Medal as of now -ID following and pt will have outpt ID f/u 09/11/11  7. ESRD (end stage renal disease) (03/19/2007) -renal consulted and following -Cr. baseline 3-4 range  -HD access chest pulled after dialysis 8/17 d/t bacteremia -new diatek cath placed right fem vein on 8/20 by VVS -HD TTHSat  -VVS placed new RUE AVgraft today pt needs pain control for new AVgraft   8. ANEMIA OF CHRONIC DISEASE -Aransep -pending anemia panel   9. 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 DVT/GIPx -scds -protonix   11. F/E/N -renal following  electrolytes -diet ordered   12. Dispo -d/c possibly 8/27 or 8/28 if ok with renal and VVS  -social work has placement at [facility] and setting up outpt HD center.    Code status: full   LOS: 11 days   Cresenciano Genre [id number] 09/01/2011, 5:10 PM

## 2011-09-01 NOTE — Op Note (Signed)
OPERATIVE REPORT  DATE OF SURGERY: 09/01/2011  PATIENT: [name], 56 y.o. female MRN: [id number]  DOB: [birth year]  PRE-OPERATIVE DIAGNOSIS: End-stage renal disease  POST-OPERATIVE DIAGNOSIS:  Same  PROCEDURE: Right upper arm AV Gore-Tex graft placement  SURGEON:  Curt Jews, M.D.  PHYSICIAN ASSISTANT: Rhyne  ANESTHESIA:  Gen.  EBL: Minimal ml  Total I/O In: 400 [I.V.:400] Out: -   BLOOD ADMINISTERED: None  DRAINS: None  SPECIMEN: None  COUNTS CORRECT:  YES  PLAN OF CARE: PACU   PATIENT DISPOSITION:  PACU - hemodynamically stable  PROCEDURE DETAILS: The patient was taken to the operating placed supine position where the area of the right arm. A sterile fashion. An incision was made over the brachial pulse at the antecubital space. The artery was of good caliber with minimal atherosclerotic change. Next separate incision was made at the exit well over the axillary pulse and carried down to isolate the axillary vein which was also of good caliber. A tunnel was created between the level of the antecubital space in the axilla and the 6 mm standard wall stretch Gore-Tex graft was brought through the tunnel. The vein was occluded proximally and distally was opened with an 11 blade and extended longitudinally with Potts scissors. The graft was spatulated and sewn end-to-side to the vein with a running 6-0 Prolene suture. Clamps removed from the vein and the graft was flushed with heparinized saline and reoccluded. Next the brachial artery was occluded proximally and distally and was opened with an 11 blade and extended longitudinally with Potts scissors. A small arteriotomy was created. The graft was cut to appropriate length and was sewn end-to-side to the artery with a running 6-0 Prolene suture. Grams were removed and excellent thrill was noted. The wounds were irrigated with saline. Hemostasis was obtained with electrocautery. The wounds were closed with 3-0 Vicryl in the  subcutaneous and subcuticular tissue. Her dressing was applied   Curt Jews, M.D. 09/01/2011 2:08 PM

## 2011-09-01 NOTE — Interval H&P Note (Signed)
History and Physical Interval Note:  09/01/2011 12:00 PM  [name]  has presented today for surgery, with the diagnosis of End Stage Renal Disease  The various methods of treatment have been discussed with the patient and family. After consideration of risks, benefits and other options for treatment, the patient has consented to  Procedure(s) (LRB): INSERTION OF ARTERIOVENOUS (AV) GORE-TEX GRAFT ARM (Right) as a surgical intervention .  The patient's history has been reviewed, patient examined, no change in status, stable for surgery.  I have reviewed the patient's chart and labs.  Questions were answered to the patient's satisfaction.     Ovadia Lopp

## 2011-09-01 NOTE — Progress Notes (Addendum)
Out of sequence order for 08/30/11 in regards to reading the ppd that was placed 08/28/11 - result was negative read by Particia Nearing rn read on 08/30/11 at 1630.  Raymon Mutton rn

## 2011-09-01 NOTE — Anesthesia Postprocedure Evaluation (Signed)
  Anesthesia Post-op Note  Patient: [name]  Procedure(s) Performed: Procedure(s) (LRB): INSERTION OF ARTERIOVENOUS (AV) GORE-TEX GRAFT ARM (Right)  Patient Location: PACU  Anesthesia Type: General  Level of Consciousness: awake  Airway and Oxygen Therapy: Patient Spontanous Breathing  Post-op Pain: mild  Post-op Assessment: Post-op Vital signs reviewed  Post-op Vital Signs: Reviewed  Complications: No apparent anesthesia complications

## 2011-09-01 NOTE — Progress Notes (Signed)
09/01/2011 11:11 AM Hemodialysis Outpatient Note;This patient still has her spot at the [facility] location on [address]. She is a TTS 2nd shift patient and the center will expect her on Thursday 08.29.13 at 1130 AM. Thank you. Gordy Savers

## 2011-09-01 NOTE — Progress Notes (Signed)
Dr Oletta Lamas aware pt has SBP in pacu in the 80's.  Order received to give albumin 5%.

## 2011-09-01 NOTE — Transfer of Care (Signed)
Immediate Anesthesia Transfer of Care Note  Patient: [name]  Procedure(s) Performed: Procedure(s) (LRB): INSERTION OF ARTERIOVENOUS (AV) GORE-TEX GRAFT ARM (Right)  Patient Location: PACU  Anesthesia Type: General  Level of Consciousness: awake and alert   Airway & Oxygen Therapy: Patient Spontanous Breathing  Post-op Assessment: Report given to PACU RN and Post -op Vital signs reviewed and stable  Post vital signs: Reviewed and stable  Complications: No apparent anesthesia complications

## 2011-09-01 NOTE — Anesthesia Procedure Notes (Signed)
Procedure Name: LMA Insertion Date/Time: 09/01/2011 12:32 PM Performed by: Manuela Schwartz B Pre-anesthesia Checklist: Patient identified, Emergency Drugs available, Suction available, Patient being monitored and Timeout performed Patient Re-evaluated:Patient Re-evaluated prior to inductionOxygen Delivery Method: Circle system utilized Preoxygenation: Pre-oxygenation with 100% oxygen Intubation Type: IV induction LMA: LMA inserted LMA Size: 4.0 Number of attempts: 1 Placement Confirmation: positive ETCO2 and breath sounds checked- equal and bilateral Tube secured with: Tape Dental Injury: Teeth and Oropharynx as per pre-operative assessment

## 2011-09-02 ENCOUNTER — Encounter (HOSPITAL_COMMUNITY): Payer: Self-pay | Admitting: Vascular Surgery

## 2011-09-02 ENCOUNTER — Inpatient Hospital Stay (HOSPITAL_COMMUNITY): Payer: Medicaid Other

## 2011-09-02 LAB — CBC WITH DIFFERENTIAL/PLATELET
Basophils Absolute: 0 10*3/uL (ref 0.0–0.1)
Basophils Relative: 1 % (ref 0–1)
Eosinophils Absolute: 0.1 10*3/uL (ref 0.0–0.7)
Eosinophils Relative: 2 % (ref 0–5)
HCT: 29.7 % — ABNORMAL LOW (ref 36.0–46.0)
Hemoglobin: 9 g/dL — ABNORMAL LOW (ref 12.0–15.0)
Lymphocytes Relative: 34 % (ref 12–46)
Lymphs Abs: 1.6 10*3/uL (ref 0.7–4.0)
MCH: 31.6 pg (ref 26.0–34.0)
MCHC: 30.3 g/dL (ref 30.0–36.0)
MCV: 104.2 fL — ABNORMAL HIGH (ref 78.0–100.0)
Monocytes Absolute: 0.4 10*3/uL (ref 0.1–1.0)
Monocytes Relative: 8 % (ref 3–12)
Neutro Abs: 2.6 10*3/uL (ref 1.7–7.7)
Neutrophils Relative %: 55 % (ref 43–77)
Platelets: 122 10*3/uL — ABNORMAL LOW (ref 150–400)
RBC: 2.85 MIL/uL — ABNORMAL LOW (ref 3.87–5.11)
RDW: 18 % — ABNORMAL HIGH (ref 11.5–15.5)
WBC: 4.7 10*3/uL (ref 4.0–10.5)

## 2011-09-02 LAB — RENAL FUNCTION PANEL
Albumin: 2.3 g/dL — ABNORMAL LOW (ref 3.5–5.2)
BUN: 27 mg/dL — ABNORMAL HIGH (ref 6–23)
CO2: 30 mEq/L (ref 19–32)
Calcium: 9.2 mg/dL (ref 8.4–10.5)
Chloride: 103 mEq/L (ref 96–112)
Creatinine, Ser: 6.07 mg/dL — ABNORMAL HIGH (ref 0.50–1.10)
GFR calc Af Amer: 8 mL/min — ABNORMAL LOW (ref >90)
GFR calc non Af Amer: 7 mL/min — ABNORMAL LOW (ref >90)
Glucose, Bld: 89 mg/dL (ref 70–99)
Phosphorus: 4.2 mg/dL (ref 2.3–4.6)
Potassium: 6.5 mEq/L (ref 3.5–5.1)
Sodium: 139 mEq/L (ref 135–145)

## 2011-09-02 LAB — FERRITIN: Ferritin: 798 ng/mL — ABNORMAL HIGH (ref 10–291)

## 2011-09-02 LAB — VITAMIN B12: Vitamin B-12: 554 pg/mL (ref 211–911)

## 2011-09-02 LAB — FOLATE: Folate: 14 ng/mL

## 2011-09-02 LAB — POTASSIUM: Potassium: 3.8 mEq/L (ref 3.5–5.1)

## 2011-09-02 LAB — HEPATITIS B SURFACE ANTIGEN: Hepatitis B Surface Ag: NEGATIVE

## 2011-09-02 MED ORDER — RITONAVIR 100 MG PO TABS: 100.0000 mg | ORAL_TABLET | Freq: Every day | ORAL | Status: DC

## 2011-09-02 MED ORDER — DARUNAVIR ETHANOLATE 800 MG PO TABS: 800.0000 mg | ORAL_TABLET | Freq: Every day | ORAL | Status: DC

## 2011-09-02 MED ORDER — OXYCODONE-ACETAMINOPHEN 5-325 MG PO TABS
ORAL_TABLET | ORAL | Status: AC
  Administered 2011-09-02: 2 via ORAL
  Filled 2011-09-02: qty 2

## 2011-09-02 MED ORDER — STAVUDINE 15 MG PO CAPS: 15.0000 mg | ORAL_CAPSULE | Freq: Every day | ORAL | Status: DC

## 2011-09-02 MED ORDER — SODIUM CHLORIDE 0.9 % IV SOLN: 6.0000 mg/kg | INTRAVENOUS | Status: DC

## 2011-09-02 MED ORDER — SODIUM CHLORIDE 0.9 % IV SOLN: 6.0000 mg/kg | INTRAVENOUS | Status: AC

## 2011-09-02 MED ORDER — PARICALCITOL 5 MCG/ML IV SOLN
INTRAVENOUS | Status: AC
  Administered 2011-09-02: 4 ug via INTRAVENOUS
  Filled 2011-09-02: qty 1

## 2011-09-02 MED ORDER — VANCOMYCIN 50 MG/ML ORAL SOLUTION: 125.0000 mg | Freq: Four times a day (QID) | ORAL | Status: AC

## 2011-09-02 MED ORDER — LAMIVUDINE 10 MG/ML PO SOLN: 50.0000 mg | Freq: Every day | ORAL | Status: DC

## 2011-09-02 NOTE — Progress Notes (Signed)
ANTIBIOTIC CONSULT NOTE - Follow UP  Pharmacy Consult for Daptomycin Indication: Vancomycin-Resistant Enterococcus Bacteremia  No Known Allergies  Patient Measurements: Height: 5\' 3"  (160 cm) Weight: 111 lb 15.9 oz (50.8 kg) IBW/kg (Calculated) : 52.4   Vital Signs: Temp: 97.1 F (36.2 C) (08/27 0655) Temp src: Oral (08/27 0655) BP: 90/46 mmHg (08/27 0845) Pulse Rate: 52  (08/27 0845) Intake/Output from previous day: 08/26 0701 - 08/27 0700 In: 750 [I.V.:750] Out: -  Intake/Output from this shift:    Labs:  Basename 09/02/11 0716 09/01/11 0524  WBC 4.7 4.9  HGB 9.0* 9.6*  PLT 122* 98*  LABCREA -- --  CREATININE 6.07* 4.88*   Estimated Creatinine Clearance: 8.5 ml/min (by C-G formula based on Cr of 6.07). No results found for this basename: VANCOTROUGH:2,VANCOPEAK:2,VANCORANDOM:2,GENTTROUGH:2,GENTPEAK:2,GENTRANDOM:2,TOBRATROUGH:2,TOBRAPEAK:2,TOBRARND:2,AMIKACINPEAK:2,AMIKACINTROU:2,AMIKACIN:2, in the last 72 hours   Microbiology: Recent Results (from the past 720 hour(s))  CULTURE, BLOOD (ROUTINE X 2)     Status: Normal   Collection Time   08/21/11 11:35 PM      Component Value Range Status Comment   Specimen Description BLOOD LEFT ARM   Final    Special Requests BOTTLES DRAWN AEROBIC AND ANAEROBIC 5ML   Final    Culture  Setup Time 08/22/2011 04:58   Final    Culture     Final    Value: VANCOMYCIN RESISTANT ENTEROCOCCUS ISOLATED     4 Note: DAPTOMYCIN S CRITICAL RESULT CALLED TO, READ BACK BY AND VERIFIED WITH: ASHLEY@ 1025 08/25/11 BY KRAWS     Note: Gram Stain Report Called to,Read Back By and Verified With: KIMBERLY PORTER @ 2150 ON 08/22/11 BY GOLLD   Report Status 08/30/2011 FINAL   Final    Organism ID, Bacteria VANCOMYCIN RESISTANT ENTEROCOCCUS ISOLATED   Final   CULTURE, BLOOD (ROUTINE X 2)     Status: Normal   Collection Time   08/21/11 11:59 PM      Component Value Range Status Comment   Specimen Description BLOOD LEFT HAND   Final    Special Requests  BOTTLES DRAWN AEROBIC AND ANAEROBIC 5CC   Final    Culture  Setup Time 08/22/2011 04:59   Final    Culture     Final    Value: ENTEROCOCCUS SPECIES     Note: SUSCEPTIBILITIES PERFORMED ON PREVIOUS CULTURE WITHIN THE LAST 5 DAYS.     Note: Gram Stain Report Called to,Read Back By and Verified With: KIMBERLY PORTER @ 2150 ON 08/22/11 BY GOLLD   Report Status 08/25/2011 FINAL   Final   MRSA PCR SCREENING     Status: Normal   Collection Time   08/22/11  5:24 AM      Component Value Range Status Comment   MRSA by PCR NEGATIVE  NEGATIVE Final   CLOSTRIDIUM DIFFICILE BY PCR     Status: Abnormal   Collection Time   08/22/11 10:48 AM      Component Value Range Status Comment   C difficile by pcr POSITIVE (*) NEGATIVE Final   CULTURE, BLOOD (ROUTINE X 2)     Status: Normal   Collection Time   08/22/11 11:15 PM      Component Value Range Status Comment   Specimen Description BLOOD RIGHT ARM   Final    Special Requests BOTTLES DRAWN AEROBIC AND ANAEROBIC 10CC   Final    Culture  Setup Time 08/23/2011 05:11   Final    Culture     Final    Value: ENTEROCOCCUS SPECIES  Note: SUSCEPTIBILITIES PERFORMED ON PREVIOUS CULTURE WITHIN THE LAST 5 DAYS.     Note: Gram Stain Report Called to,Read Back By and Verified With: RN K. GENGLER ON 08/23/11 AT 2145 BY DTERRY   Report Status 08/25/2011 FINAL   Final   CULTURE, BLOOD (ROUTINE X 2)     Status: Normal   Collection Time   08/22/11 11:20 PM      Component Value Range Status Comment   Specimen Description BLOOD RIGHT ARM   Final    Special Requests BOTTLES DRAWN AEROBIC AND ANAEROBIC 10CC   Final    Culture  Setup Time 08/23/2011 05:11   Final    Culture     Final    Value: ENTEROCOCCUS SPECIES     Note: SUSCEPTIBILITIES PERFORMED ON PREVIOUS CULTURE WITHIN THE LAST 5 DAYS.     Note: Gram Stain Report Called to,Read Back By and Verified With: RN K. GENGLER ON 08/23/11 AT 2145 BY DTERRY   Report Status 08/25/2011 FINAL   Final   CATH TIP CULTURE      Status: Normal   Collection Time   08/23/11  2:19 PM      Component Value Range Status Comment   Specimen Description CATH TIP   Final    Special Requests NONE   Final    Culture     Final    Value: 80 COLONIES VANCOMYCIN RESISTANT ENTEROCOCCUS ISOLATED     Note: RVB TINA O 08/27/11@830  BY REAMM   Report Status 08/27/2011 FINAL   Final    Organism ID, Bacteria VANCOMYCIN RESISTANT ENTEROCOCCUS ISOLATED   Final   CULTURE, BLOOD (ROUTINE X 2)     Status: Normal   Collection Time   08/26/11  7:55 AM      Component Value Range Status Comment   Specimen Description BLOOD HAND LEFT   Final    Special Requests BOTTLES DRAWN AEROBIC ONLY 10CC   Final    Culture  Setup Time 08/26/2011 15:00   Final    Culture NO GROWTH 5 DAYS   Final    Report Status 09/01/2011 FINAL   Final   CULTURE, BLOOD (ROUTINE X 2)     Status: Normal   Collection Time   08/26/11  8:05 AM      Component Value Range Status Comment   Specimen Description BLOOD LEFT FOREARM   Final    Special Requests BOTTLES DRAWN AEROBIC AND ANAEROBIC 10CC EACH   Final    Culture  Setup Time 08/26/2011 15:01   Final    Culture NO GROWTH 5 DAYS   Final    Report Status 09/01/2011 FINAL   Final     Medical History: Past Medical History  Diagnosis Date  . Human immunodeficiency virus (HIV) disease   . Hypertension   . ESRD (end stage renal disease)   . Dialysis patient     pt on dialysis since 2010.  [name] Clostridium difficile infection 04/04/10    07/2011 and 08/2011  . Bacteriuria, asymptomatic 04/04/10    Culture grew VRE sensitive to linesolid   . MGUS (monoclonal gammopathy of unknown significance)   . History of bacteremia     MSSA  . Renal insufficiency   . Anemia   . Bacteremia     05/2011 and 08/2011    Admit Compliant/ HPI: 56 y.o. F with ESRD and extensive ID PMH that includes HIV/AIDs, bacterial endocarditis, PCP, MSSA bacteremia. She was admitted on 8/16 with abdominal pain, fever, and  neutropenia. At that time she was  found to bacteremia with enterococcus that was found to be vancomycin resistant. Pharmacy consulted to dose daptomycin after anemia and thromboyctopenia episode attributed to linezolid.    The patient received HD sessions on T/Th/Sat. The patient has continued to remain on her home HD schedule since Dapto was started. The patient is currently receiving HD this a.m. No dose adjustments are needed at this time. Baseline CPK on 8/23 was wnl at 63 -- will get another level on 8/30. Per ID, to treat the VRE bacteremia for a total of 8 weeks from the first negative blood culture on 8/20 >> so planned thru 10/15.  8/20 BCx>> ngtd 8/16, 8/17 BCx >> VRE (S-Zyvox) 8/16 C-Diff + 8/16 MRSA PCR negative 8/15 BCx x2 >> VRE (S-Zyvox)  Daptomycin 8/22 >> (planned 10/15) Linezolid 8/18 >> 8/22 Vanc po 8/16 >> Vanc IV 8/16 >> 8/18 Rocephin 8/16 >> 8/18  Goal of Therapy:  Eradication of Infection  Plan:  1. Continue Daptomycin 282 mg (~6 mg/kg) IV post HD sessions on T/Th/Sat 2. Will monitor weekly CPKs while on Daptomycin (next due 8/30) 3. Will continue to follow HD schedule/duration, additional culture results, and LOT  Alycia Rossetti, PharmD, BCPS Clinical Pharmacist Pager: [phone] 09/02/2011 8:56 AM

## 2011-09-02 NOTE — Progress Notes (Signed)
Potassium of 6.5 Pt in HD Dr. Aundra Dubin notified

## 2011-09-02 NOTE — Progress Notes (Signed)
Pt ambulated on RA and oxygen saturations dropped to 88%.  Pt placed back on oxygen and is saturating mid 90% on 2L.  Pt stable.  MD notified.  WIll continue to monitor patient. Jennette Banker

## 2011-09-02 NOTE — Progress Notes (Addendum)
Subjective: Patient denies complaints. Pain in right upper extremity is better today.    Objective: Vital signs in last 24 hours: Filed Vitals:   09/02/11 1045 09/02/11 1100 09/02/11 1108 09/02/11 1154  BP: 90/51 90/44 102/55 95/56  Pulse: 60 54 56 73  Temp:   97.4 F (36.3 C) 97.8 F (36.6 C)  TempSrc:   Oral Oral  Resp: 15 13 11 10   Height:      Weight:   110 lb 14.3 oz (50.3 kg)   SpO2: 95% 97% 98% 98%   Weight change: -1 lb 1.6 oz (-0.5 kg)  Intake/Output Summary (Last 24 hours) at 09/02/11 1227 Last data filed at 09/02/11 1108  Gross per 24 hour  Intake    750 ml  Output    560 ml  Net    190 ml   Vitals reviewed. General: resting in bed, NAD, cachetic  HEENT: no scleral icterus Cardiac: systolic murmur; no rubs, murmurs or gallops Pulm: clear to auscultation bilaterally, no wheezes, rales, or rhonchi Abd: soft, nontender, nondistended, BS present Ext: warm and well perfused, no pedal edema, scds intact Neuro: alert and oriented X3 Skin: hyperpigmentation to skin lower ext b/l    Lab Results: Basic Metabolic Panel:  Lab [id number] 0716 09/01/11 0524  NA 139 135  K 6.5* 5.0  CL 103 101  CO2 30 30  GLUCOSE 89 79  BUN 27* 18  CREATININE 6.07* 4.88*  CALCIUM 9.2 9.5  MG -- --  PHOS 4.2 2.4   Liver Function Tests:  Lab 09/02/11 0716 09/01/11 0524  AST -- --  ALT -- --  ALKPHOS -- --  BILITOT -- --  PROT -- --  ALBUMIN 2.3* 2.1*   CBC:  Lab 09/02/11 0716 09/01/11 0524 08/30/11 0730  WBC 4.7 4.9 --  NEUTROABS 2.6 -- 2.2  HGB 9.0* 9.6* --  HCT 29.7* 31.3* --  MCV 104.2* 103.0* --  PLT 122* 98* --    Misc. Labs: no recent  Micro Results: Recent Results (from the past 240 hour(s))  CATH TIP CULTURE     Status: Normal   Collection Time   08/23/11  2:19 PM      Component Value Range Status Comment   Specimen Description CATH TIP   Final    Special Requests NONE   Final    Culture     Final    Value: 80 COLONIES VANCOMYCIN RESISTANT  ENTEROCOCCUS ISOLATED     Note: RVB TINA O 08/27/11@830  BY REAMM   Report Status 08/27/2011 FINAL   Final    Organism ID, Bacteria VANCOMYCIN RESISTANT ENTEROCOCCUS ISOLATED   Final   CULTURE, BLOOD (ROUTINE X 2)     Status: Normal   Collection Time   08/26/11  7:55 AM      Component Value Range Status Comment   Specimen Description BLOOD HAND LEFT   Final    Special Requests BOTTLES DRAWN AEROBIC ONLY 10CC   Final    Culture  Setup Time 08/26/2011 15:00   Final    Culture NO GROWTH 5 DAYS   Final    Report Status 09/01/2011 FINAL   Final   CULTURE, BLOOD (ROUTINE X 2)     Status: Normal   Collection Time   08/26/11  8:05 AM      Component Value Range Status Comment   Specimen Description BLOOD LEFT FOREARM   Final    Special Requests BOTTLES DRAWN AEROBIC AND ANAEROBIC 10CC EACH   Final  Culture  Setup Time 08/26/2011 15:01   Final    Culture NO GROWTH 5 DAYS   Final    Report Status 09/01/2011 FINAL   Final     Medications:  Scheduled Meds:    . albumin human  12.5 g Intravenous Once  . albumin human      . aspirin  81 mg Oral Daily  . DAPTOmycin (CUBICIN)  IV  6 mg/kg (Order-Specific) Intravenous Custom  . darbepoetin  100 mcg Intravenous Q Sat-HD  . darunavir  800 mg Oral Q breakfast  . feeding supplement  237 mL Oral BID BM  . lamiVUDine  50 mg Oral Daily  . midodrine  10 mg Oral BID WC  . multivitamin  1 tablet Oral QHS  . pantoprazole  20 mg Oral Q1200  . paricalcitol  4 mcg Intravenous Q T,Th,Sa-HD  . ritonavir  100 mg Oral Q breakfast  . sodium chloride  3 mL Intravenous Q12H  . stavudine  15 mg Oral Daily  . vancomycin  125 mg Oral QID   Continuous Infusions:    . sodium chloride 20 mL/hr at 09/01/11 1112   PRN Meds:.sodium chloride, sodium chloride, acetaminophen, heparin, heparin, heparin, heparin, HYDROmorphone (DILAUDID) injection, lidocaine, lidocaine-prilocaine, ondansetron (ZOFRAN) IV, ondansetron, oxyCODONE-acetaminophen,  pentafluoroprop-tetrafluoroeth, DISCONTD: 0.9 % irrigation (POUR BTL), DISCONTD: heparin 6000 unit irrigation  Assessment/Plan:   56 y.o PMH HIV, HTN, ESRD on HD TTHSat, h/o bacteriuria with VRE 03/2010, h/o MSSA and coag neg staph bacteremia and potential endocarditis 2/2 left IJ infection 05/2011, h/o C. Diff (07/2011), MGUS, h/o  anemia, h/o pancytopenia/thrombocytopenia with EF 40-45% prior to admission.  She presented 8/16 with fever and ab pain.    1. Bacteremia -4 blood Cxs aerobic/ anaerobic +VRE and previous cath tip +enterococcus 80K colonies  -old HD cath removed 8/17 d/t HD related bacteremia  Plan -repeat blood cultures 8/20 no growth final -cont Daptomycin (8/22) dosed q48 with HD x 2 weeks until 09/11/11 -pt has outpt f/u with Dr. Tommy Medal 09/11/11 at 4:30 pm and he will repeat blood cultures at that time and determine future tx -pt will follow up with Internal Medicine clinic 09/11/11 at 8:30 am   2. C.diff -still with active diarrhea  -isolation precautions and protocol -Rx oral Vancomycin 125 mg qid (since 8/16) will tx for duration of abx therapy (2 weeks) for bacteremia plus at least to 09/25/11  3. Bradycardia  -intermittent bradycardia  -pt is asymptomatic, HR 69 this am  -monitored VS, evaluated by cardiology this admission.   -will make f/u outpatient  4. Ischemic and Nonischemic cardiomyopathy -EF 35-40% -cards saw this admission rec medical management: avoid BB d/t bradycardia but can try ACEI or ARB when bp can tolerate; also rec vasodilator tx if bp improves -will schedule outpt cards [facility] [place]. [place], [place] [address] [phone]   5. History HTN with hypotension this admission -bp 94/60 but intermittently high this admission -normally runs low after HD  -Hold home Lopressor  -Cont Midodrine 10 mg bid  -monitor bp -careful with IVF boluses 2/2 #4  6. HIV INFECTION  -repeat cd 4 ct 8/20 was 310; HIV  genotype pending -home meds (Norvir, Epivir, Prezista, Zerit) -no PCP px with Bactrim 3x per week per ID Dr. Tommy Medal as of now; ID following and pt will have outpt ID f/u 09/11/11 to determine tx   7. ESRD (end stage renal disease) (03/19/2007) -renal consulted and following -Cr.  baseline 3-4 range  -HD access right chest pulled after dialysis 8/17 d/t bacteremia -new diatek cath placed right fem vein on 8/20 by VVS -HD TTHSat  -VVS placed new RUE AVgraft 09/01/11 which can not be accessed for 4 weeks but patient does not need follow up with VVS at discharge and [facility]VS clears for discharge today -renal also clears for discharge with outpt HD at center [facility] location on [address] expectedThursday 08.29.13 at 11:30 AM (this HD center has NOT changed for the patient) -PA for renal called the center to make aware pt will need Daptomycin at least until 09/11/11.   -pending HBsAg    8. ANEMIA OF CHRONIC DISEASE -Aransep -anemia panel consistent with anemia of chronic dz this admission  9. H/o Pancytopenia/Thrombocytopenia  -chronic pancytopenia, thrombocytopenia noted 8/15 on admission with plts 146 -plts 122 this am slight trend up -monitor cbc -Daptomycin can cause thrombocytopenia but more likely w/ Linezolid which pt was previously on but now dc'ed  -ordered w/u for HIT with serotonin release assay which is pending  10. DVT/GIPx -scds -protonix   11. F/E/N -renal following electrolytes (hyperK today 6.5); pt received HD; ordered EKG stat; will recheck K before discharge today -diet ordered   12. Dispo -d/c today to [facility] with outpt HD center f/u   Code status: full   LOS: 12 days   Cresenciano Genre [id number] 09/02/2011, 12:27 PM

## 2011-09-02 NOTE — Progress Notes (Signed)
[facility]  PROCEDURE: Right upper arm AV Gore-Tex graft placement    HPI:  [name] has presented today for surgery, with the diagnosis of End Stage Renal Disease    Past Medical History  Diagnosis Date  . Human immunodeficiency virus (HIV) disease   . Hypertension   . ESRD (end stage renal disease)   . Dialysis patient     pt on dialysis since 2010.  [name] Clostridium difficile infection 04/04/10    07/2011 and 08/2011  . Bacteriuria, asymptomatic 04/04/10    Culture grew VRE sensitive to linesolid   . MGUS (monoclonal gammopathy of unknown significance)   . History of bacteremia     MSSA  . Renal insufficiency   . Anemia   . Bacteremia     05/2011 and 08/2011    FH:  Non-Contributory  History   Social History  . Marital Status: Single    Spouse Name: N/A    Number of Children: N/A  . Years of Education: N/A   Occupational History  . Not on file.   Social History Main Topics  . Smoking status: Never Smoker   . Smokeless tobacco: Never Used  . Alcohol Use: No  . Drug Use: No  . Sexually Active: No   Other Topics Concern  . Not on file   Social History Narrative   Lives with daughter(does not know dx). Oldest daughter does not dx.Current partner of may years,? fidelity    No Known Allergies  Current Facility-Administered Medications  Medication Dose Route Frequency Provider Last Rate Last Dose  . 0.9 %  sodium chloride infusion  100 mL Intravenous PRN Sol Blazing, MD      . 0.9 %  sodium chloride infusion  100 mL Intravenous PRN Sol Blazing, MD      . 0.9 %  sodium chloride infusion   Intravenous Continuous Rosetta Posner, MD 20 mL/hr at 09/01/11 1112    . acetaminophen (TYLENOL) tablet 650 mg  650 mg Oral Q6H PRN Bartholomew Crews, MD   650 mg at 08/31/11 0551  . albumin human 5 % solution 12.5 g  12.5 g Intravenous Once Finis Bud, MD   12.5 g at 09/01/11 1500  . albumin human 5 % solution           . aspirin chewable  tablet 81 mg  81 mg Oral Daily Bartholomew Crews, MD   81 mg at 09/01/11 1624  . DAPTOmycin (CUBICIN) 282 mg in sodium chloride 0.9 % IVPB  6 mg/kg (Order-Specific) Intravenous Custom Rolla Flatten, PHARMD   282 mg at 08/30/11 2302  . darbepoetin (ARANESP) injection 100 mcg  100 mcg Intravenous Q Sat-HD Axel Filler, MD   100 mcg at 08/30/11 0855  . Darunavir Ethanolate (PREZISTA) tablet 800 mg  800 mg Oral Q breakfast Rosalia Hammers, MD   800 mg at 09/01/11 0908  . feeding supplement (ENSURE COMPLETE) liquid 237 mL  237 mL Oral BID BM Myriam Jacobson, PA   237 mL at 09/01/11 1618  . heparin injection 1,000 Units  1,000 Units Dialysis PRN Sol Blazing, MD      . heparin injection 1,000 Units  20 Units/kg Dialysis PRN Ramiro Harvest, PA   1,000 Units at 08/30/11 0718  . heparin injection 4,800 Units  100 Units/kg Dialysis PRN Ramiro Harvest, PA   4,800 Units at 08/26/11 1919  . heparin injection 5,900 Units  5,900 Units Dialysis  PRN Sol Blazing, MD      . HYDROmorphone (DILAUDID) injection 0.25-0.5 mg  0.25-0.5 mg Intravenous Q5 min PRN Finis Bud, MD      . lamiVUDine (EPIVIR) 10 MG/ML solution 50 mg  50 mg Oral Daily Rosalia Hammers, MD   50 mg at 09/01/11 0908  . lidocaine (XYLOCAINE) 1 % injection 5 mL  5 mL Intradermal PRN Sol Blazing, MD      . lidocaine-prilocaine (EMLA) cream 1 application  1 application Topical PRN Sol Blazing, MD      . midodrine (PROAMATINE) tablet 10 mg  10 mg Oral BID WC Sol Blazing, MD   10 mg at 09/01/11 1618  . multivitamin (RENA-VIT) tablet 1 tablet  1 tablet Oral QHS Rolla Flatten, PHARMD   1 tablet at 09/01/11 2115  . ondansetron (ZOFRAN) tablet 4 mg  4 mg Oral Q6H PRN Rosalia Hammers, MD   4 mg at 08/25/11 1430   Or  . ondansetron (ZOFRAN) injection 4 mg  4 mg Intravenous Q6H PRN Rosalia Hammers, MD      . oxyCODONE-acetaminophen (PERCOCET/ROXICET) 5-325 MG per tablet 1-2 tablet  1-2 tablet Oral Q4H PRN Gae Gallop,  MD   2 tablet at 09/01/11 2115  . pantoprazole (PROTONIX) EC tablet 20 mg  20 mg Oral Q1200 Cresenciano Genre, MD   20 mg at 09/01/11 1619  . paricalcitol (ZEMPLAR) injection 4 mcg  4 mcg Intravenous Q T,Th,Sa-HD Lauren Bajbus, PHARMD   4 mcg at 08/30/11 0856  . pentafluoroprop-tetrafluoroeth (GEBAUERS) aerosol 1 application  1 application Topical PRN Sol Blazing, MD      . ritonavir (NORVIR) tablet 100 mg  100 mg Oral Q breakfast Rosalia Hammers, MD   100 mg at 09/01/11 0908  . sodium chloride 0.9 % injection 3 mL  3 mL Intravenous Q12H Rosalia Hammers, MD   3 mL at 09/01/11 0908  . stavudine (ZERIT) capsule 15 mg  15 mg Oral Daily Truman Hayward, MD   15 mg at 09/01/11 0908  . vancomycin (VANCOCIN) 50 mg/mL oral solution 125 mg  125 mg Oral QID Truman Hayward, MD   125 mg at 09/01/11 2115  . DISCONTD: 0.9 % irrigation (POUR BTL)    PRN Rosetta Posner, MD   1,000 mL at 09/01/11 1258  . DISCONTD: heparin 6,000 Units in sodium chloride irrigation 0.9 % 500 mL irrigation    PRN Rosetta Posner, MD       Facility-Administered Medications Ordered in Other Encounters  Medication Dose Route Frequency Provider Last Rate Last Dose  . DISCONTD: cefUROXime (ZINACEF) 1.5 g in dextrose 5 % 50 mL IVPB  1.5 g  Continuous PRN Babs Bertin, CRNA   1.5 g at 09/01/11 1245  . DISCONTD: ePHEDrine injection    PRN Babs Bertin, CRNA   10 mg at 09/01/11 1257  . DISCONTD: fentaNYL (SUBLIMAZE) injection    PRN Babs Bertin, CRNA   50 mcg at 09/01/11 1240  . DISCONTD: lidocaine (cardiac) 100 mg/67ml (XYLOCAINE) 20 MG/ML injection 2%    PRN Babs Bertin, CRNA   60 mg at 09/01/11 1229  . DISCONTD: ondansetron (ZOFRAN) injection    PRN Babs Bertin, CRNA   4 mg at 09/01/11 1240  . DISCONTD: phenylephrine (NEO-SYNEPHRINE) injection    PRN Babs Bertin, CRNA   50 mcg at 09/01/11 1331  . DISCONTD: propofol (DIPRIVAN) 10 mg/ml infusion  PRN Babs Bertin, CRNA   160 mg at 09/01/11 1229    ROS:  See  HPI  PHYSICAL EXAM  Filed Vitals:   09/02/11 0750  BP: 86/50  Pulse: 60  Temp:   Resp: 11    Gen:  WNL Right upper extremity graft palpable proximal thrill Dressing changed no active bleeding Distally radial pulse present, sensation bilaterally intact and grossly eaqual     Impression: This is a 56 y.o. female here for diatek catheter removal  Plan: They are currently using the right thigh  diatek catheter for dialysis Right upper arm graft will be ready in 4 weeks F/U PRN  Gerri Lins, PA-C [facility] [phone] 09/02/2011 8:16 AM

## 2011-09-02 NOTE — Discharge Summary (Signed)
[facility] Discharge Note  Name: [name] MRN: [id number] DOB: 1955/02/04 56 y.o.  Date of Admission: 08/21/2011 11:18 PM Date of Discharge: 09/02/2011 Attending Physician: Axel Filler, MD  Discharge Diagnosis: 1) Vancomycin Resistant Bacteremia -VRE bacteremia and HD related bacteremia with 4 blood cultures aerobic/ anaerobic +VRE and previous right chest HD catheter tip +enterococcus 80K colonies.  Her HD catheter was removed on 08/23/11.  She had a TTE 8/17 which showed an EF 50-55%, mild LVH, grade 1 diastolic dysfunction, mild AR, trivial pericardial effusion ant. Heart.  She had a TEE 8/21 moderate hypertrophied LV; EF 35-40%; global hypokinesis and inf/inferiorlateral akinesis no thrombus/mass; mild thick MV; small calcified mobile mass on chordae tendinae possibly potentially ruptured cord or old organized thrombus; trace mild MR; aortic valve with trace central regurgitation.  The mass mentioned previously was noted on TTE in 05/2011.  Cardiology assessed her this admission as well as Infectious Disease.  When the blood cultures were initially positive without speciation she was started on Rocephin 1 g q24 hours iv and Vancomycin iv 500 mg for three days this admission. Then she was swithed to Zyvoxx 60 mg iv since 8/18-8/22/13. Zyvoxx was then changed to Daptomycin 08/28/11 q48 hour dosing with dialysis (HD) due to Zyvoxx greater potential to affect her CBC panel than Daptomycin.  She needs to continue Daptomycin for duration of two weeks which will end 09/11/11.  She had repeat blood cultures on 08/26/11 which were negative.  She will see ID-Dr. Tommy Medal for follow up 09/11/11 at 4:30 and a decision will be made to continue or discontinue antibiotics from that appointment based on repeat blood cultures 09/11/11.    Of note in 05/18/2011 she has bacteremia with staph aureus and coagulate negative staph and had a TTE 05/21/11 which showed a similar cardiac lesion as  noted on this admission's TEE.    2)  C. difficile colitis + C difficile this admission and She also had C.difficile associated with fever, diarrhea, abdominal pain on admission.  Fever and abdominal pain have resolved but she continues to have active diarrhea.  She was on isolation precautions and protocol.  She is to receive oral Vancomycin 125 mg qid (started 8/16 ) for the duration of Daptomycin antibiotic therapy for VRE bacteremia, HD catheter infection plus 2 weeks after discontinue Daptomycin.     3) ESRD (end stage renal disease) on dialysis TThSat -Being followed by [facility] who followed her this admission.Her baseline creatinine is 3-4.  Her HD schedule is TTHSat.  She had her right chest HD access pulled after dialysis 8/17 bacteremia with VRE.  She had a new diatek catheter placed in her right femoral vein on 8/20 by [facility].  On 8/26 after negative blood cultures patient had right AV graft placed in right upper extremity which can not be accessed for 4 weeks but patient does not need follow up with [facility] at discharge.  Her HD center is [facility] location on [address]. She is a [facility]ThS 2nd shift schedule Thursday 08.29.13 at 11:30 AM is her first date after discharge.  She has a pending HBsAg.     4) HIV INFECTION -HIV INFECTION.  We checked her cd 4 count twice this admission. Her last CD4 ct 210 07/2011 and 07/2011 Viral load [id number].  She had a repeat cd4 ct 130 08/22/11 and viral load [id number].  CD4 count can decrease in acute illness.  Repeat cd 4 ct 8/20 was 310; HIV  genotype is pending.  We continued her HAART medications from home with some dose adjustments for renal function this admission which correspond to the medications listed above.  She should continue to take Norvir, Epivir, Prezista, Zerit.   We will hold on PCP prophylaxis with Bactrim 3x per week this admission.  Dr. Tommy Medal or another provider following in this patient in Infectious Disease can address this in the future. She  has an appt 09/11/11 at 4:30 with Dr. Tommy Medal   5) Cardiomyopathy (mixed ischemic and nonischemic) -She has history of ischemic and nonischemic cardiomyopathy with EF this admission found to be 35-40%.  Of note on nuclear stress 05/2011 there was a large fixed inferior defect which is likely scar consistent with TEE this admission.  Cardiology recommended medical management but avoiding BB due bradycardia.  If her blood pressure can tolerate in the future consider adding an ACEI or ARB and cardiology also mentioned considering a vasodilator in the future.  She has a follow up appointment on 09/17/11 at 2:30PM with Cardiology at [place].   6) Bradycardia -Bradycardia this admission with heart rate intermittently trending in the 30s-50s.  Cardiology was consulted on this as well and commented initially that they were concerned due to the bacteremia of a possibly valvular abscess.  This was not noted of TEE.  The patient continues to be asymptomatic.  We held her Lopressor (BB) this admission.    7) ANEMIA OF CHRONIC DISEASE -History of anemia of chronic disease which was confirmed this admission with anemia panel she will continue to take Aransep per renal.       8) Pancytopenia (chronic)/Thrombocytopenia (chronic) -chronic pancytopenia/chronic thrombocytopenia.  On admission her platelets were 146 and on day of discharge her platelets were 122. There is a pending serotonin assay to work up for heparin induced thrombocytopenia    9)Intermittent Hypotension She has a history of hypotension on admission and throughout this admission especially after dialysis.  We held her Lopressor this admission due to hypotension and bradycardia and restarted Midodrine 10 mg bid. She continues to have intermittent hypotensive episodes with systolic blood pressure in the 80s-90s especially after dialysis.    10) Fever -T max 102.9 on admission. Resolved by discharge.  The  etiology of her fever could have been secondary to VRE bacteremia, HD catheter related infection or C.dificile.     11)Hyperkalemia -On day of discharge her potassium was 6.5 before dialysis but trended down to 3.8 after dialysis.     12)Physical deconditioning/Weight loss/Protein calorie malnutrition -She will need PT/OT to evaluate and treat and well as continuing nutritional supplement with a regular diet.    13)DVT Prophylaxis -She was given Heparin 5000 units tid sq x 1 day and placed on sequential compression devices this admission.    14)GI Prophylaxis  She was given Protonix   15)Dispo Patient will go to [facility] skilled nursing facility for PT/OT services for physical deconditioning and care. She also ambulated on day of discharge with O2 saturation of 88% on room air.  We will send her with O2 2L .     Discharge Medications: Medication List  As of 09/02/2011  3:34 PM   STOP taking these medications         metoprolol succinate 12.5 mg Tb24      ZERIT 1 MG/ML Solr         TAKE these medications         acetaminophen 650  MG CR tablet   Commonly known as: TYLENOL   Take 650 mg by mouth every 8 (eight) hours as needed. For pain.      ARANESP (ALBUMIN FREE) 150 MCG/0.3ML Soln   Generic drug: darbepoetin   Inject 150 mcg into the skin. Use weekly for Epogen 20000 units. Dialysis nurse to administer.      aspirin 81 MG tablet   Take 81 mg by mouth daily.      b complex vitamins tablet   Take 1 tablet by mouth daily.      b complex-vitamin c-folic acid 0.8 MG Tabs   Take 0.8 mg by mouth daily.      Darunavir Ethanolate 800 MG tablet   Commonly known as: PREZISTA   Take 1 tablet (800 mg total) by mouth daily with breakfast.      feeding supplement Liqd   Take 237 mLs by mouth 2 (two) times daily between meals.      feeding supplement (NEPRO CARB STEADY) Liqd   Take 237 mLs by mouth as needed (missed meal during dialysis.).      lamiVUDine 10 MG/ML  solution   Commonly known as: EPIVIR   Take 5 mLs (50 mg total) by mouth daily.      midodrine 10 MG tablet   Commonly known as: PROAMATINE   Take 10 mg by mouth 2 (two) times daily at 10 AM and 5 PM.      ritonavir 100 MG Tabs   Commonly known as: NORVIR   Take 1 tablet (100 mg total) by mouth daily with breakfast.      sodium chloride 0.9 % SOLN 100 mL with DAPTOmycin 500 MG SOLR 282 mg   Inject 282 mg into the vein every other day (To Be given every 48 hours with dialysis TThSat) until 09/11/11 until further indicated.       stavudine 15 MG capsule   Commonly known as: ZERIT   Take 1 capsule (15 mg total) by mouth daily.      vancomycin 50 mg/mL oral solution   Commonly known as: VANCOCIN   Take 2.5 mLs (125 mg total) by mouth 4 (four) times daily.      VENOFER 20 MG/ML injection   Generic drug: iron sucrose   100 mg IV on Tuesday HD(HD RN to dispense)      ZEMPLAR 5 MCG/ML injection   Generic drug: paricalcitol   4 micrograms IV- dispensed by dialysis nurse.            Disposition and follow-up:   Ms.Sharicka A Draft was discharged from [facility] in stable condition.  At the hospital follow up visit please address  1) Check BMP, CBC 2) follow up appointments (see below A-D)  A) Dialysis- [facility] location on [address]. She is a [facility]ThS 2nd shift schedule Thursday 08.29.13 at 11:30 AM  B) Internal Medicine ([place] appt 09/11/11 at 8:30 am  [place] (Infectious Disease) will address continuation of Daptomycin duration 09/11/11 at 4:30 PM; please address PCP prophylaxis treatment in the future   D) 09/17/11 at 2:30 M--[facility] [place] [place], [place] [address]  [phone]   3) follow up on serotonin assay to check for HIT for thrombocytopenia 4) f/u on HepBsAG, HIV genotype 5) If Blood pressure increases consider restarting Metoprolol but be careful with history of bradycardia this  admission 6) Cardiomyopathy-patient needs to be on an ACEI/ARB  if blood pressure can tolerate.  Also cardiology evaluated this admission and considered a vasodilator as well but she has Cardiology follow up (see above)  7) Monitor vital signs-Oxygen as needed. Patient did not wear Oxygen prior to admission except after HD sessions.     Follow-up Appointments: Follow-up Information    Follow up with EARLY, TODD, MD. (As needed)    Contact information:   [address] [place] [phone]         Discharge Orders    Future Appointments: Provider: Department: Dept Phone: Center:   09/11/2011 8:30 AM Trish Fountain, MD [facility]   09/11/2011 4:30 PM Truman Hayward, MD [facility] [phone] [facility]     Future Orders Please Complete By Expires   For home use only DME oxygen      Questions: Responses:   Mode or (Route) Nasal cannula   Liters per Minute 2   Frequency Continuous   Diet - low sodium heart healthy      Increase activity slowly      Call MD for:  temperature >100.4      Face-to-face encounter (required for Medicare/Medicaid patients)      Comments:   Kris Mouton N certify that this patient is under my care and that I, or a nurse practitioner or physician's assistant working with me, had a face-to-face encounter that meets the physician face-to-face encounter requirements with this patient on 09/02/2011.  See discharge summary   Questions: Responses:   The encounter with the patient was in whole, or in part, for the following medical condition, which is the primary reason for home health care physical deconditioning d/t multiple illnesses acute and chronic   I certify that, based on my findings, the following services are medically necessary home health services Physical therapy   My clinical findings support the need for the above services OTHER SEE COMMENTS   Further, I certify that my clinical  findings support that this patient is homebound due to: Unable to leave home safely without assistance    Unsafe ambulation due to balance issues   To provide the following care/treatments PT    OT   Discharge instructions      Comments:   1) Internal Medicine Clinic 09/11/11 at 8:30 am [phone] 2)Dr. Tommy Medal 09/11/11 4:30 PM [place] [facility] 3) Cardiology (Heart doctor)-09/17/11 at 2:30 with Cardiology [facility] [place] [place], [place]  [address]           [phone]      Consultations: Treatment Team:  Sol Blazing, MD  Procedures Performed:  Ir Veno/ext/bi  08/28/2011  *RADIOLOGY REPORT*  Clinical Data: End-stage renal disease, venous access planning for new access, current access is a femoral tunneled catheter  ULTRASOUND GUIDANCE VASCULAR ACCESS BILATERAL UPPER EXTREMITY AND CENTRAL VENOGRAMS  Date:  08/28/2011 09:50:00  Radiologist:  M. Daryll Brod, M.D.  Medications:  1% lidocaine locally  Guidance:  Ultrasound fluoroscopic  Fluoroscopy time:  0.8 minutes  Sedation time:  None.  Contrast volume:  40 ml [id number]  Complications:  No immediate  PROCEDURE/FINDINGS:  Informed consent was obtained from the patient following explanation of the procedure, risks, benefits and alternatives. The patient understands, agrees and consents for the procedure. All questions were addressed.  A time out was performed.  Maximal barrier sterile technique utilized including caps, mask, sterile gowns, sterile gloves, large sterile drape,  hand hygiene, and Chloroprep  Under sterile conditions and local anesthesia, micropuncture access was performed with ultrasound of the left upper extremity brachial vein above the elbow.  Guide wire was advanced followed by the 4- [place] dilator.  Dilator remains in place for contrast injection to perform venography.  In a similar fashion, the right brachial vein was accessed with a micropuncture needle  utilizing direct ultrasound.  This was also done under sterile conditions.  Guide wire advanced followed by a micro dilator.  Dilator remains for contrast injection for a right upper extremity venogram  Left upper extremity:  Small caliber recanalized brachial veins evident in the upper arm.  Axillary vein is patent more centrally. Within the chest, the subclavian vein is patent with a focal stenosis at the distal clavicle and first rib.  Contrast eventually passes through this area to opacified a small caliber narrowed left innominate vein.  Jugular paraspinal collaterals noted.  SVC patent.  Right upper extremity:  Right brachial and axillary veins are patent peripherally.  Right subclavian vein also demonstrates a focal stenosis at the distal clavicle first rib however this is not significantly flow limiting as contrast easily passes through into a patent right innominate vein and SVC.  IMPRESSION: Upper extremity venous anatomy as described.  The right upper extremity peripheral and central veins appear more favorable for dialysis access than the left side.   Original Report Authenticated By: Jerilynn Mages. Daryll Brod, M.D.    Ir US Guide Vasc Access Left  08/28/2011  *RADIOLOGY REPORT*  Clinical Data: End-stage renal disease, venous access planning for new access, current access is a femoral tunneled catheter  ULTRASOUND GUIDANCE VASCULAR ACCESS BILATERAL UPPER EXTREMITY AND CENTRAL VENOGRAMS  Date:  08/28/2011 09:50:00  Radiologist:  M. Daryll Brod, M.D.  Medications:  1% lidocaine locally  Guidance:  Ultrasound fluoroscopic  Fluoroscopy time:  0.8 minutes  Sedation time:  None.  Contrast volume:  40 ml [id number]  Complications:  No immediate  PROCEDURE/FINDINGS:  Informed consent was obtained from the patient following explanation of the procedure, risks, benefits and alternatives. The patient understands, agrees and consents for the procedure. All questions were addressed.  A time out was performed.  Maximal  barrier sterile technique utilized including caps, mask, sterile gowns, sterile gloves, large sterile drape, hand hygiene, and Chloroprep  Under sterile conditions and local anesthesia, micropuncture access was performed with ultrasound of the left upper extremity brachial vein above the elbow.  Guide wire was advanced followed by the 4- [place] dilator.  Dilator remains in place for contrast injection to perform venography.  In a similar fashion, the right brachial vein was accessed with a micropuncture needle utilizing direct ultrasound.  This was also done under sterile conditions.  Guide wire advanced followed by a micro dilator.  Dilator remains for contrast injection for a right upper extremity venogram  Left upper extremity:  Small caliber recanalized brachial veins evident in the upper arm.  Axillary vein is patent more centrally. Within the chest, the subclavian vein is patent with a focal stenosis at the distal clavicle and first rib.  Contrast eventually passes through this area to opacified a small caliber narrowed left innominate vein.  Jugular paraspinal collaterals noted.  SVC patent.  Right upper extremity:  Right brachial and axillary veins are patent peripherally.  Right subclavian vein also demonstrates a focal stenosis at the distal clavicle first rib however this is not significantly flow limiting as contrast easily passes through into a patent right innominate vein and  SVC.  IMPRESSION: Upper extremity venous anatomy as described.  The right upper extremity peripheral and central veins appear more favorable for dialysis access than the left side.   Original Report Authenticated By: Jerilynn Mages. Daryll Brod, M.D.    Ir US Guide Vasc Access Right  08/28/2011  *RADIOLOGY REPORT*  Clinical Data: End-stage renal disease, venous access planning for new access, current access is a femoral tunneled catheter  ULTRASOUND GUIDANCE VASCULAR ACCESS BILATERAL UPPER EXTREMITY AND CENTRAL VENOGRAMS  Date:  08/28/2011  09:50:00  Radiologist:  M. Daryll Brod, M.D.  Medications:  1% lidocaine locally  Guidance:  Ultrasound fluoroscopic  Fluoroscopy time:  0.8 minutes  Sedation time:  None.  Contrast volume:  40 ml [id number]  Complications:  No immediate  PROCEDURE/FINDINGS:  Informed consent was obtained from the patient following explanation of the procedure, risks, benefits and alternatives. The patient understands, agrees and consents for the procedure. All questions were addressed.  A time out was performed.  Maximal barrier sterile technique utilized including caps, mask, sterile gowns, sterile gloves, large sterile drape, hand hygiene, and Chloroprep  Under sterile conditions and local anesthesia, micropuncture access was performed with ultrasound of the left upper extremity brachial vein above the elbow.  Guide wire was advanced followed by the 4- [place] dilator.  Dilator remains in place for contrast injection to perform venography.  In a similar fashion, the right brachial vein was accessed with a micropuncture needle utilizing direct ultrasound.  This was also done under sterile conditions.  Guide wire advanced followed by a micro dilator.  Dilator remains for contrast injection for a right upper extremity venogram  Left upper extremity:  Small caliber recanalized brachial veins evident in the upper arm.  Axillary vein is patent more centrally. Within the chest, the subclavian vein is patent with a focal stenosis at the distal clavicle and first rib.  Contrast eventually passes through this area to opacified a small caliber narrowed left innominate vein.  Jugular paraspinal collaterals noted.  SVC patent.  Right upper extremity:  Right brachial and axillary veins are patent peripherally.  Right subclavian vein also demonstrates a focal stenosis at the distal clavicle first rib however this is not significantly flow limiting as contrast easily passes through into a patent right innominate vein and SVC.  IMPRESSION:  Upper extremity venous anatomy as described.  The right upper extremity peripheral and central veins appear more favorable for dialysis access than the left side.   Original Report Authenticated By: Jerilynn Mages. Daryll Brod, M.D.    Dg Chest Port 1 View  08/26/2011  *RADIOLOGY REPORT*  Clinical Data: Right femoral Diatek catheter placement  PORTABLE CHEST - 1 VIEW  Comparison: 07/11/2011  Findings: Femoral catheter has been placed with tips in the right atrium and SVC.  Right jugular catheter has been removed since the prior study.  Cardiac enlargement.  Normal vascularity.  No edema or effusion.  IMPRESSION: Femoral catheter tips in the right atrium and SVC.  Lungs are clear.   Original Report Authenticated By: Truett Perna, M.D.    Dg Abd Acute W/chest  08/22/2011  *RADIOLOGY REPORT*  Clinical Data: Abdominal pain  ACUTE ABDOMEN SERIES (ABDOMEN 2 VIEW & CHEST 1 VIEW)  Comparison: 07/11/2011 and earlier studies  Findings: Right IJ tunneled hemodialysis catheter extends to the proximal right atrium.  Stable mild cardiomegaly.  Vascular clips in the left axilla.  Lungs are clear.  No effusion.  No free air. Nonobstructive bowel gas pattern.  Left pelvic phlebolith. Regional  bones unremarkable.  IMPRESSION:  1.  Stable mild cardiomegaly. 2.  Normal bowel gas pattern.  No free air.  Original Report Authenticated By: Trecia Rogers, M.D.    2D Echo:  8/17 TTE Transthoracic Echocardiography  Patient: [name], [name] MR #: [id number] Study Date: 08/23/2011 Gender: F Age: 39 Height: 160cm Weight: 55kg BSA: 1.50m^2 Pt. Status: Room: 2604  PERFORMING Charolette Forward, MD ADMITTING Jay Schlichter ATTENDING Marinda Elk Valetta Close SONOGRAPHER Alvin Critchley, RDCS cc:  ------------------------------------------------------------ LV EF: 50% - 55%  ------------------------------------------------------------ Indications: Endocarditis 421.9,  suspected, pre-procedure.  ------------------------------------------------------------ History: PMH: Fever. Risk factors: HIV. Anemia. End stage renal disease. C-Diff. Hypertension. Cardiomyopathy. Hypertension.  ------------------------------------------------------------ Study Conclusions  - Left ventricle: The cavity size was normal. Wall thickness was increased in a pattern of mild LVH. Systolic function was normal. The estimated ejection fraction was in the range of 50% to 55%. Wall motion was normal; there were no regional wall motion abnormalities. Doppler parameters are consistent with abnormal left ventricular relaxation (grade 1 diastolic dysfunction). - Aortic valve: Mild regurgitation. - Atrial septum: No defect or patent foramen ovale was identified. - Pericardium, extracardiac: A trivial pericardial effusion was identified anterior to the heart. Features were not consistent with tamponade physiology. Transthoracic echocardiography. M-mode, complete 2D, spectral Doppler, and color Doppler. Height: Height: 160cm. Height: 63in. Weight: Weight: 55kg. Weight: 121lb. Body mass index: BMI: 21.5kg/m^2. Body surface area: BSA: 1.65m^2. Blood pressure: 94/69. Patient status: Inpatient. Location: Bedside.  ------------------------------------------------------------  ------------------------------------------------------------ Left ventricle: The cavity size was normal. Wall thickness was increased in a pattern of mild LVH. Systolic function was normal. The estimated ejection fraction was in the range of 50% to 55%. Wall motion was normal; there were no regional wall motion abnormalities. Doppler parameters are consistent with abnormal left ventricular relaxation (grade 1 diastolic dysfunction).  ------------------------------------------------------------ Aortic valve: Trileaflet; mildly calcified leaflets. Doppler: Mild  regurgitation.  ------------------------------------------------------------ Mitral valve: Structurally normal valve. Leaflet separation was normal. Doppler: Transvalvular velocity was within the normal range. There was no evidence for stenosis. No significant regurgitation. Peak gradient: 36mm Hg (D).  ------------------------------------------------------------ Left atrium: The atrium was normal in size.  ------------------------------------------------------------ Atrial septum: No defect or patent foramen ovale was identified.  ------------------------------------------------------------ Right ventricle: The cavity size was normal. Wall thickness was normal. Systolic function was normal.  ------------------------------------------------------------ Pulmonic valve: Structurally normal valve. Cusp separation was normal. Doppler: Transvalvular velocity was within the normal range. No significant regurgitation.  ------------------------------------------------------------ Tricuspid valve: Structurally normal valve. Leaflet separation was normal. Doppler: Transvalvular velocity was within the normal range. No significant regurgitation.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

## 2011-09-02 NOTE — Procedures (Signed)
I was present at this dialysis session. I have reviewed the session itself and made appropriate changes.   Kelly Splinter, MD [employer] 09/02/2011, 9:49 AM

## 2011-09-02 NOTE — Progress Notes (Signed)
Clinical Social Work-CSW facilitated pt d/c to [facility] with Chart copy/PTAR transport-pt and family notified of d/c-No further needs at this time- Gerre Scull, [phone]

## 2011-09-03 LAB — HIV-1 GENOTYPR PLUS

## 2011-09-05 LAB — MISCELLANEOUS TEST

## 2011-09-11 ENCOUNTER — Encounter: Payer: Self-pay | Admitting: Infectious Disease

## 2011-09-11 ENCOUNTER — Ambulatory Visit (INDEPENDENT_AMBULATORY_CARE_PROVIDER_SITE_OTHER): Payer: Medicaid Other | Admitting: Infectious Disease

## 2011-09-11 ENCOUNTER — Ambulatory Visit: Payer: Medicaid Other | Admitting: Internal Medicine

## 2011-09-11 ENCOUNTER — Ambulatory Visit (INDEPENDENT_AMBULATORY_CARE_PROVIDER_SITE_OTHER): Payer: Medicaid Other | Admitting: Internal Medicine

## 2011-09-11 ENCOUNTER — Encounter: Payer: Self-pay | Admitting: Internal Medicine

## 2011-09-11 VITALS — BP 124/81 | HR 73 | Temp 98.3°F | Ht 66.0 in | Wt 119.0 lb

## 2011-09-11 VITALS — BP 138/84 | HR 68 | Temp 97.7°F | Ht 64.0 in | Wt 119.1 lb

## 2011-09-11 DIAGNOSIS — B20 Human immunodeficiency virus [HIV] disease: Secondary | ICD-10-CM

## 2011-09-11 DIAGNOSIS — Z1639 Resistance to other specified antimicrobial drug: Secondary | ICD-10-CM

## 2011-09-11 DIAGNOSIS — A0472 Enterocolitis due to Clostridium difficile, not specified as recurrent: Secondary | ICD-10-CM

## 2011-09-11 DIAGNOSIS — D61818 Other pancytopenia: Secondary | ICD-10-CM

## 2011-09-11 DIAGNOSIS — B952 Enterococcus as the cause of diseases classified elsewhere: Secondary | ICD-10-CM

## 2011-09-11 DIAGNOSIS — I10 Essential (primary) hypertension: Secondary | ICD-10-CM

## 2011-09-11 DIAGNOSIS — R7881 Bacteremia: Secondary | ICD-10-CM

## 2011-09-11 DIAGNOSIS — I129 Hypertensive chronic kidney disease with stage 1 through stage 4 chronic kidney disease, or unspecified chronic kidney disease: Secondary | ICD-10-CM

## 2011-09-11 DIAGNOSIS — N186 End stage renal disease: Secondary | ICD-10-CM

## 2011-09-11 DIAGNOSIS — I120 Hypertensive chronic kidney disease with stage 5 chronic kidney disease or end stage renal disease: Secondary | ICD-10-CM

## 2011-09-11 LAB — COMPREHENSIVE METABOLIC PANEL
ALT: <8 U/L (ref 0–35)
AST: 22 U/L (ref 0–37)
Albumin: 3.1 g/dL — ABNORMAL LOW (ref 3.5–5.2)
Alkaline Phosphatase: 112 U/L (ref 39–117)
BUN: 23 mg/dL (ref 6–23)
CO2: 25 mEq/L (ref 19–32)
Calcium: 9.2 mg/dL (ref 8.4–10.5)
Chloride: 99 mEq/L (ref 96–112)
Creat: 6.8 mg/dL — ABNORMAL HIGH (ref 0.50–1.10)
Glucose, Bld: 71 mg/dL (ref 70–99)
Potassium: 4.2 mEq/L (ref 3.5–5.3)
Sodium: 135 mEq/L (ref 135–145)
Total Bilirubin: 0.5 mg/dL (ref 0.3–1.2)
Total Protein: 9.7 g/dL — ABNORMAL HIGH (ref 6.0–8.3)

## 2011-09-11 LAB — CBC WITH DIFFERENTIAL/PLATELET
Basophils Absolute: 0.1 10*3/uL (ref 0.0–0.1)
Basophils Relative: 2 % — ABNORMAL HIGH (ref 0–1)
Eosinophils Absolute: 0.1 10*3/uL (ref 0.0–0.7)
Eosinophils Relative: 3 % (ref 0–5)
HCT: 32.2 % — ABNORMAL LOW (ref 36.0–46.0)
Hemoglobin: 10.2 g/dL — ABNORMAL LOW (ref 12.0–15.0)
Lymphocytes Relative: 37 % (ref 12–46)
Lymphs Abs: 1.4 10*3/uL (ref 0.7–4.0)
MCH: 32.7 pg (ref 26.0–34.0)
MCHC: 31.7 g/dL (ref 30.0–36.0)
MCV: 103.2 fL — ABNORMAL HIGH (ref 78.0–100.0)
Monocytes Absolute: 0.5 10*3/uL (ref 0.1–1.0)
Monocytes Relative: 14 % — ABNORMAL HIGH (ref 3–12)
Neutro Abs: 1.7 10*3/uL (ref 1.7–7.7)
Neutrophils Relative %: 44 % (ref 43–77)
Platelets: 242 10*3/uL (ref 150–400)
RBC: 3.12 MIL/uL — ABNORMAL LOW (ref 3.87–5.11)
RDW: 21.3 % — ABNORMAL HIGH (ref 11.5–15.5)
WBC: 3.8 10*3/uL — ABNORMAL LOW (ref 4.0–10.5)

## 2011-09-11 NOTE — Progress Notes (Signed)
Subjective:   Patient ID: [name] female   DOB: 22-Sep-1955 56 y.o.   MRN: [id number]  HPI: [name] is a 56 y.o. woman who presents to clinic today for follow up after her hospitalization.  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 Diff, and ESRD.    She states that since discharge she has been doing well.  She was discharged to [facility] for rehab and help with her IV antibiotics.  She states that her appetite is still down some but that she has not had a recurrence of the fevers or chills.   She has been going to her dialysis and that she is tolerating it well.  She is encouraged that her therapy is going well.    She was diagnosed with recurrent C. Diff during her hospitalization and was discharged on an extended oral vancomycin taper.  She denies fevers, chills, nausea, vomiting, diarrhea, constipation, abdominal pain, or malaise.  She states that she is having 1 soft BM per day.    She saw Dr. Tommy Medal today for follow up on her VRE bacteremia as well as her HIV.  She states that she is compliant with her medications and that it is much easier without having to hide her medications from her family.  She was started on Atovaquone today for PCP prophylaxis.    Past Medical History  Diagnosis Date  . Human immunodeficiency virus (HIV) disease   . Hypertension   . ESRD (end stage renal disease)   . Dialysis patient     pt on dialysis since 2010.  [name] Clostridium difficile infection 04/04/10    07/2011 and 08/2011  . Bacteriuria, asymptomatic 04/04/10    Culture grew VRE sensitive to linesolid   . MGUS (monoclonal gammopathy of unknown significance)   . History of bacteremia     MSSA  . Renal insufficiency   . Anemia   . Bacteremia     05/2011 and 08/2011   Current Outpatient Prescriptions  Medication Sig Dispense Refill  . acetaminophen (TYLENOL) 650 MG CR tablet Take 650 mg by mouth every  8 (eight) hours as needed. For pain.      [name] aspirin 81 MG tablet Take 81 mg by mouth daily.        [name] b complex vitamins tablet Take 1 tablet by mouth daily.        [name] b complex-vitamin c-folic acid (NEPHRO-VITE) 0.8 MG TABS Take 0.8 mg by mouth daily.        . darbepoetin (ARANESP, ALBUMIN FREE,) 150 MCG/0.3ML SOLN Inject 150 mcg into the skin. Use weekly for Epogen 20000 units. Dialysis nurse to administer.       . Darunavir Ethanolate (PREZISTA) 800 MG tablet Take 1 tablet (800 mg total) by mouth daily with breakfast.  30 tablet  0  . feeding supplement (ENSURE COMPLETE) LIQD Take 237 mLs by mouth 2 (two) times daily between meals.  60 Bottle  11  . iron sucrose (VENOFER) 20 MG/ML injection 100 mg IV on Tuesday HD(HD RN to dispense)       . lamiVUDine (EPIVIR) 10 MG/ML solution Take 5 mLs (50 mg total) by mouth daily.  240 mL    . midodrine (PROAMATINE) 10 MG tablet Take 10 mg by mouth 2 (two) times daily at 10 AM and 5 PM.      . Nutritional Supplements (FEEDING SUPPLEMENT, NEPRO CARB STEADY,) LIQD  Take 237 mLs by mouth as needed (missed meal during dialysis.).  30 Can  11  . paricalcitol (ZEMPLAR) 5 MCG/ML injection 4 micrograms IV- dispensed by dialysis nurse.       . ritonavir (NORVIR) 100 MG TABS Take 1 tablet (100 mg total) by mouth daily with breakfast.  30 tablet  0  . sodium chloride 0.9 % SOLN 100 mL with DAPTOmycin 500 MG SOLR 282 mg Inject 282 mg into the vein every other day.  282 mg  4  . stavudine (ZERIT) 15 MG capsule Take 1 capsule (15 mg total) by mouth daily.      . vancomycin (VANCOCIN) 50 mg/mL oral solution Take 2.5 mLs (125 mg total) by mouth 4 (four) times daily.  10 mL  25   Family History  Problem Relation Age of Onset  . Alcohol abuse      family h/o addiction/alcoholism  . Diabetes Cousin     first degree relatives  . Kidney disease Mother   . Kidney disease Maternal Uncle   . Cancer Sister    History   Social History  . Marital Status: Single    Spouse  Name: N/A    Number of Children: N/A  . Years of Education: N/A   Social History Main Topics  . Smoking status: Never Smoker   . Smokeless tobacco: Never Used  . Alcohol Use: No  . Drug Use: No  . Sexually Active: No   Other Topics Concern  . None   Social History Narrative   Lives with daughter(does not know dx). Oldest daughter does not dx.Current partner of may years,? fidelity   Review of Systems: Constitutional: Denies fever, chills, diaphoresis, appetite change and fatigue.  HEENT: Denies photophobia, eye pain, redness, hearing loss, ear pain, congestion, sore throat, rhinorrhea, sneezing, mouth sores, trouble swallowing, neck pain, neck stiffness and tinnitus.   Respiratory: Denies SOB, DOE, cough, chest tightness,  and wheezing.   Cardiovascular: Denies chest pain, palpitations and leg swelling.  Gastrointestinal: Denies nausea, vomiting, abdominal pain, diarrhea, constipation, blood in stool and abdominal distention.  Genitourinary: Denies dysuria, urgency, frequency, hematuria, flank pain and difficulty urinating.  Musculoskeletal: Denies myalgias, back pain, joint swelling, arthralgias and gait problem.  Skin: Denies pallor, rash and wound.  Neurological: Denies dizziness, seizures, syncope, weakness, light-headedness, numbness and headaches.  Hematological: Denies adenopathy. Easy bruising, personal or family bleeding history  Psychiatric/Behavioral: Denies suicidal ideation, mood changes, confusion, nervousness, sleep disturbance and agitation  Objective:  Physical Exam: Filed Vitals:   09/11/11 0924  BP: 138/84  Pulse: 68  Temp: 97.7 F (36.5 C)  TempSrc: Oral  Height: 5\' 4"  (1.626 m)  Weight: 119 lb 1.6 oz (54.023 kg)  SpO2: 97%   Constitutional: Vital signs reviewed.  Patient is a cachectic woman in no acute distress and cooperative with exam. Alert and oriented x3.  Head: Normocephalic and atraumatic Ear: TM normal bilaterally Mouth: no erythema or  exudates, MMM Eyes: PERRL, EOMI, conjunctivae normal, No scleral icterus.  Neck: Supple, Trachea midline normal ROM, No JVD, mass, thyromegaly, or carotid bruit present.  Cardiovascular: 2/6 systolic murmur at LLSB, RRR, S1 normal, S2 normal, no RG, pulses symmetric and intact bilaterally Pulmonary/Chest: CTAB, no wheezes, rales, or rhonchi Abdominal: Soft. Non-tender, non-distended, bowel sounds are normal, no masses, organomegaly, or guarding present.  GU: no CVA tenderness Musculoskeletal: No joint deformities, erythema, or stiffness, ROM full and no nontender Hematology: no cervical, inginal, or axillary adenopathy.  Neurological: A&O x3, Strength is  normal and symmetric bilaterally, cranial nerve II-XII are grossly intact, no focal motor deficit, sensory intact to light touch bilaterally.  Skin: Warm, dry and intact. No rash, cyanosis, or clubbing.  Psychiatric: Normal mood and affect. speech and behavior is normal. Judgment and thought content normal. Cognition and memory are normal.   Assessment & Plan:

## 2011-09-11 NOTE — Patient Instructions (Signed)
1.  Stop in the lab to get your blood drawn.  Dr. Tommy Medal will discuss the results with you this afternoon.  2.  Continue taking your medications as prescribed.  3.  Follow up with [place] in about 1 month to see how you are doing.

## 2011-09-11 NOTE — Assessment & Plan Note (Signed)
This was associated with a hemodialysis catheter. Her transesophageal echocardiogram failed to show evidence of a new vegetation and we opted to treat with 2 weeks of antibiotics post clearing of her VRE bacteremia.

## 2011-09-11 NOTE — Assessment & Plan Note (Signed)
Continue her current regimen of Prezista boosted with Norvir along with limited he been and stavudine. I will start her on PCP prophylaxis with 1500 mg of atovaquone. And picking this drug for PCP prophylaxis hoping that'll be less risky for inducing recurrence of her C. difficile colitis. We will bring her back for repeat labs in one month and to see her again in my clinic in 6 weeks' time.

## 2011-09-11 NOTE — Patient Instructions (Signed)
I WOULD LIKE YOU TO COMPLETE A VANCOMYCIN PULSE AND TAPER WHICH I HAVE OUTLINED IN YOUR NOTE IT IS  125 mg orally four times daily THRU 09/23/11  125 mg orally twice daily for 7 days THEN 125 mg orally once daily for 7 days THEN 125 mg orally every other day for 7 days THEN 125 mg orally every 3 days for 14 days THEN STOP  YOU NEED TO BE ON ATOVAQUONE 1500MG  DAILY FOR PCP PROPHYLAXIS  PLEASE CONTINUE YOUR ANTIVIRALS  COME BACK FOR REPEAT LABS IN ONE MONTH AND APPT WITH DR. VAN DAM IN 6 WEEKS

## 2011-09-11 NOTE — Assessment & Plan Note (Signed)
Recurrent. We'll give her a vancomycin pulse and taper regimen with vancomycin 125 mg 4 times daily through September 17 followed by 7 days of vancomycin given twice daily followed by 7 days of vancomycin given daily followed by 7 days of vancomycin given every other day followed by 2 weeks of vancomycin given every 3 days.

## 2011-09-11 NOTE — Progress Notes (Signed)
Subjective:    Patient ID: [name], female    DOB: [birth year], 56 y.o.   MRN: [id number]  HPI  56 year old African American female with HIV AIDS end-stage renal disease on hemodialysis who is been highly noncompliant with her anti-retroviral regimen in the past. She was admitted to the hospital, wi fevers nausea vomiting malaise and subsequent diarrhea. She was diagnosed to have a vancomycin-resistant enterococcal bacteremia that was associated with her hemodialysis catheter and only cleared once or hemodialysis catheter was removed. 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 There are no new vegetation seen on transesophageal echocardiogram. The patient also was diagnosed on admission with Clostridium difficile colitis with recurrent.   Her vancomycin-resistant enterococcus was managed nasal bleed with the nasal lid then with daptomycin. She completed at least 2 weeks of therapy he on the date of the first negative blood culture. Last dose of daptomycin was on Saturday. I was called over the weekend when she was apparently having a rash on her arms thought to be due to the daptomycin possibly.  She currently remains on vancomycin at full treat that dose. She needs to complete an additional 2 weeks of vancomycin full dose at 125 4 times daily followed by a pulse and taper it regimen.  She is back on antiviral therapy although it is too soon to recheck a viral load and CD4 count.  Patient was adamant that she was going to be leaving the nursing facility although I am skeptical about her insight, competence and certainly ability to take care of self outside of a skilled setting. I spent greater than 45 minutes with the patient including greater than 50% of time in face to face counsel of the patient and in coordination of their care.   Review of Systems    Constitutional: Negative for fever, chills, diaphoresis, activity change, appetite change, fatigue and unexpected weight change.  HENT: Negative for congestion, sore throat, rhinorrhea, sneezing, trouble swallowing and sinus pressure.   Eyes: Negative for photophobia and visual disturbance.  Respiratory: Negative for cough, chest tightness, shortness of breath, wheezing and stridor.   Cardiovascular: Negative for chest pain, palpitations and leg swelling.  Gastrointestinal: Negative for nausea, vomiting, abdominal pain, diarrhea, constipation, blood in stool, abdominal distention and anal bleeding.  Genitourinary: Negative for dysuria, hematuria, flank pain and difficulty urinating.  Musculoskeletal: Negative for myalgias, back pain, joint swelling, arthralgias and gait problem.  Skin: Negative for color change, pallor, rash and wound.  Neurological: Negative for dizziness, tremors, weakness and light-headedness.  Hematological: Negative for adenopathy. Does not bruise/bleed easily.  Psychiatric/Behavioral: Negative for behavioral problems, confusion, disturbed wake/sleep cycle, dysphoric mood, decreased concentration and agitation.       Objective:   Physical Exam  Constitutional: She is oriented to person, place, and time. She appears cachectic.  HENT:  Head: Normocephalic and atraumatic.  Mouth/Throat: Oropharynx is clear and moist. No oropharyngeal exudate.  Eyes: Conjunctivae and EOM are normal. Pupils are equal, round, and reactive to light. No scleral icterus.  Neck: Normal range of motion. Neck supple. No JVD present.  Cardiovascular: Normal rate, regular rhythm and normal heart sounds.  Exam reveals no gallop and no friction rub.   No murmur heard. Pulmonary/Chest: Effort normal and breath sounds normal. No respiratory distress. She has no wheezes. She has no rales. She exhibits no tenderness.  Abdominal: She exhibits no distension and  no mass. There is no tenderness. There is no  rebound and no guarding.  Musculoskeletal: She exhibits no edema and no tenderness.  Lymphadenopathy:    She has no cervical adenopathy.  Neurological: She is alert and oriented to person, place, and time. She has normal reflexes. She exhibits normal muscle tone. Coordination normal.  Skin: Skin is warm and dry. No erythema. No pallor.     Psychiatric: She has a normal mood and affect. Her behavior is normal. Judgment and thought content normal.          Assessment & Plan:  VRE bacteremia This was associated with a hemodialysis catheter. Her transesophageal echocardiogram failed to show evidence of a new vegetation and we opted to treat with 2 weeks of antibiotics post clearing of her VRE bacteremia.  C. difficile colitis  Recurrent. We'll give her a vancomycin pulse and taper regimen with vancomycin 125 mg 4 times daily through September 17 followed by 7 days of vancomycin given twice daily followed by 7 days of vancomycin given daily followed by 7 days of vancomycin given every other day followed by 2 weeks of vancomycin given every 3 days.  HIV INFECTION Continue her current regimen of Prezista boosted with Norvir along with limited he been and stavudine. I will start her on PCP prophylaxis with 1500 mg of atovaquone. And picking this drug for PCP prophylaxis hoping that'll be less risky for inducing recurrence of her C. difficile colitis. We will bring her back for repeat labs in one month and to see her again in my clinic in 6 weeks' time.

## 2011-10-13 ENCOUNTER — Encounter: Payer: Medicaid Other | Admitting: Internal Medicine

## 2011-10-15 ENCOUNTER — Other Ambulatory Visit (HOSPITAL_COMMUNITY): Payer: Self-pay | Admitting: Nephrology

## 2011-10-15 DIAGNOSIS — N186 End stage renal disease: Secondary | ICD-10-CM

## 2011-10-20 ENCOUNTER — Ambulatory Visit (HOSPITAL_COMMUNITY)
Admission: RE | Admit: 2011-10-20 | Discharge: 2011-10-20 | Disposition: A | Discharge status: Home / Self Care | Payer: Medicaid Other | Source: Ambulatory Visit | Attending: Nephrology | Admitting: Nephrology

## 2011-10-20 DIAGNOSIS — Z452 Encounter for adjustment and management of vascular access device: Secondary | ICD-10-CM | POA: Insufficient documentation

## 2011-10-20 DIAGNOSIS — N186 End stage renal disease: Secondary | ICD-10-CM | POA: Insufficient documentation

## 2011-10-20 MED ORDER — CHLORHEXIDINE GLUCONATE 4 % EX LIQD
CUTANEOUS | Status: AC
  Filled 2011-10-20: qty 30

## 2011-10-20 NOTE — Procedures (Signed)
Successful removal of left thigh tunneled catheter without complication.

## 2011-10-21 ENCOUNTER — Telehealth (HOSPITAL_COMMUNITY): Payer: Self-pay | Admitting: *Deleted

## 2011-10-22 ENCOUNTER — Encounter: Payer: Self-pay | Admitting: Infectious Disease

## 2011-10-22 ENCOUNTER — Ambulatory Visit (INDEPENDENT_AMBULATORY_CARE_PROVIDER_SITE_OTHER): Payer: Medicaid Other | Admitting: Infectious Disease

## 2011-10-22 VITALS — BP 144/72 | HR 60 | Temp 98.3°F | Wt 129.5 lb

## 2011-10-22 DIAGNOSIS — Z23 Encounter for immunization: Secondary | ICD-10-CM

## 2011-10-22 DIAGNOSIS — B20 Human immunodeficiency virus [HIV] disease: Secondary | ICD-10-CM

## 2011-10-22 LAB — CBC WITH DIFFERENTIAL/PLATELET
Basophils Absolute: 0 10*3/uL (ref 0.0–0.1)
Basophils Relative: 1 % (ref 0–1)
Eosinophils Absolute: 0.3 10*3/uL (ref 0.0–0.7)
Eosinophils Relative: 6 % — ABNORMAL HIGH (ref 0–5)
HCT: 37.8 % (ref 36.0–46.0)
Hemoglobin: 12.4 g/dL (ref 12.0–15.0)
Lymphocytes Relative: 37 % (ref 12–46)
Lymphs Abs: 1.7 10*3/uL (ref 0.7–4.0)
MCH: 36.8 pg — ABNORMAL HIGH (ref 26.0–34.0)
MCHC: 32.8 g/dL (ref 30.0–36.0)
MCV: 112.2 fL — ABNORMAL HIGH (ref 78.0–100.0)
Monocytes Absolute: 1.1 10*3/uL — ABNORMAL HIGH (ref 0.1–1.0)
Monocytes Relative: 24 % — ABNORMAL HIGH (ref 3–12)
Neutro Abs: 1.5 10*3/uL — ABNORMAL LOW (ref 1.7–7.7)
Neutrophils Relative %: 32 % — ABNORMAL LOW (ref 43–77)
Platelets: 154 10*3/uL (ref 150–400)
RBC: 3.37 MIL/uL — ABNORMAL LOW (ref 3.87–5.11)
RDW: 15.9 % — ABNORMAL HIGH (ref 11.5–15.5)
WBC: 4.6 10*3/uL (ref 4.0–10.5)

## 2011-10-22 LAB — COMPLETE METABOLIC PANEL WITH GFR
ALT: 12 U/L (ref 0–35)
AST: 26 U/L (ref 0–37)
Albumin: 3.2 g/dL — ABNORMAL LOW (ref 3.5–5.2)
Alkaline Phosphatase: 135 U/L — ABNORMAL HIGH (ref 39–117)
BUN: 30 mg/dL — ABNORMAL HIGH (ref 6–23)
CO2: 27 mEq/L (ref 19–32)
Calcium: 8.9 mg/dL (ref 8.4–10.5)
Chloride: 99 mEq/L (ref 96–112)
Creat: 5.23 mg/dL — ABNORMAL HIGH (ref 0.50–1.10)
GFR, Est African American: 10 mL/min — ABNORMAL LOW
GFR, Est Non African American: 9 mL/min — ABNORMAL LOW
Glucose, Bld: 74 mg/dL (ref 70–99)
Potassium: 4.1 mEq/L (ref 3.5–5.3)
Sodium: 133 mEq/L — ABNORMAL LOW (ref 135–145)
Total Bilirubin: 0.5 mg/dL (ref 0.3–1.2)
Total Protein: 9.6 g/dL — ABNORMAL HIGH (ref 6.0–8.3)

## 2011-10-22 NOTE — Patient Instructions (Addendum)
We will check labs today  DC the atovoquone  RTC In 3 months time  Come for labs 2 weeks prior to visit

## 2011-10-22 NOTE — Progress Notes (Signed)
Subjective:    Patient ID: [name], female    DOB: 12-12-1955, 56 y.o.   MRN: [id number]  HPI  56 year old African American female with HIV AIDS end-stage renal disease on hemodialysis who is been highly noncompliant with her anti-retroviral regimen in the past. She was admitted to the hospital, wi fevers nausea vomiting malaise and subsequent diarrhea. She was diagnosed to have a vancomycin-resistant enterococcal bacteremia that was associated with her hemodialysis catheter and only cleared once or hemodialysis catheter was removed. 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 There are no new vegetation seen on transesophageal echocardiogram. The patient also was diagnosed on admission with Clostridium difficile colitis with recurrent.  Her vancomycin-resistant enterococcus was managed with ZYVOX and then  with daptomycin. She completed at least 2 weeks of therapy he on the date of the first negative blood culture. She is now also completed her oral vancomycin treatment for C. difficile colitis.   Her most recent CD4 count was above 300. He returns today from her nursing facility and claims to be highly apparent with her current and her retroviral regimen which consists of Epivir Zerit Norvir and Prezista.       Review of Systems  Constitutional: Negative for fever, chills, diaphoresis, activity change, appetite change, fatigue and unexpected weight change.  HENT: Negative for congestion, sore throat, rhinorrhea, sneezing, trouble swallowing and sinus pressure.   Eyes: Negative for photophobia and visual disturbance.  Respiratory: Negative for cough, chest tightness, shortness of breath, wheezing and stridor.   Cardiovascular: Negative for chest pain, palpitations and leg swelling.  Gastrointestinal: Negative for nausea, vomiting, abdominal pain, diarrhea,  constipation, blood in stool, abdominal distention and anal bleeding.  Genitourinary: Negative for dysuria, hematuria, flank pain and difficulty urinating.  Musculoskeletal: Negative for myalgias, back pain, joint swelling, arthralgias and gait problem.  Skin: Negative for color change, pallor, rash and wound.  Neurological: Negative for dizziness, tremors, weakness and light-headedness.  Hematological: Negative for adenopathy. Does not bruise/bleed easily.  Psychiatric/Behavioral: Negative for behavioral problems, confusion, disturbed wake/sleep cycle, dysphoric mood, decreased concentration and agitation.       Objective:   Physical Exam  Constitutional: She is oriented to person, place, and time. She appears well-developed and well-nourished. No distress.  HENT:  Head: Normocephalic and atraumatic.  Mouth/Throat: Oropharynx is clear and moist. No oropharyngeal exudate.  Eyes: Conjunctivae normal and EOM are normal. Pupils are equal, round, and reactive to light. No scleral icterus.  Neck: Normal range of motion. Neck supple.  Cardiovascular: Normal rate, regular rhythm and normal heart sounds.  Exam reveals no gallop and no friction rub.   No murmur heard. Pulmonary/Chest: Effort normal and breath sounds normal. No respiratory distress. She has no wheezes.  Abdominal: She exhibits no distension. There is no tenderness. There is no rebound.  Musculoskeletal: She exhibits no edema and no tenderness.  Neurological: She is alert and oriented to person, place, and time. She has normal reflexes. She exhibits normal muscle tone. Coordination normal.  Skin: Skin is warm and dry. She is not diaphoretic. No erythema. No pallor.     Psychiatric: She has a normal mood and affect. Her behavior is normal. Judgment and thought content normal.          Assessment & Plan:   #1 HIV: Check HIV viral load and CD4 count. Continue Prezista Norvir Zerit and Epivir, vaccinated for  influenza as well as  hepatitis B gram back to clinic in 3 months time. Discontinue PCP prophylaxis  #2 C. difficile colitis no evidence of recurrence at present avoid unless her antibiotics avoid proton pump and appears  #3 VRE bacteremia no evidence of recurrence she is now getting dialysis through a graft

## 2011-10-23 LAB — T-HELPER CELL (CD4) - (RCID CLINIC ONLY)
CD4 % Helper T Cell: 21 % — ABNORMAL LOW (ref 33–55)
CD4 T Cell Abs: 310 uL — ABNORMAL LOW (ref 400–2700)

## 2011-10-23 LAB — HIV-1 RNA QUANT-NO REFLEX-BLD
HIV 1 RNA Quant: 367 copies/mL — ABNORMAL HIGH (ref <20)
HIV-1 RNA Quant, Log: 2.56 {Log} — ABNORMAL HIGH (ref <1.30)

## 2011-11-10 ENCOUNTER — Encounter: Payer: Self-pay | Admitting: Internal Medicine

## 2011-11-10 NOTE — Progress Notes (Signed)
Patient ID: [name], female   DOB: [birth year], 56 y.o.   MRN: [id number]  This patient is a CHRONIC NO-SHOW PATIENT that has a history of HYPERTENSION.  Please make sure to address hypertension during her next clinic appointment, and intervene as appropriate.    Within the AVS, please incorporate the following smartphrase: .htntips   Pertinent Data: BP Readings from Last 3 Encounters:  10/22/11 144/72  09/11/11 124/81  09/11/11 138/84    BMI: Estimated Body mass index is 22.23 kg/(m^2) as calculated from the following:   Height as of 09/11/11: 5\' 4" (1.626 m).   Weight as of 10/22/11: 129 lb 8 oz(58.741 kg).  Smoking History: History  Smoking status  . Never Smoker   Smokeless tobacco  . Never Used    Last Basic Metabolic Panel:    Component Value Date/Time   NA 133* 10/22/2011 1051   K 4.1 10/22/2011 1051   CL 99 10/22/2011 1051   CO2 27 10/22/2011 1051   BUN 30* 10/22/2011 1051   CREATININE 5.23* 10/22/2011 1051   CREATININE 6.07* 09/02/2011 0716   GLUCOSE 74 10/22/2011 1051   CALCIUM 8.9 10/22/2011 1051   CALCIUM 7.6* 09/14/2007 1321    Allergies: Allergies  Allergen Reactions  . Penicillins Rash    Unknown reaction

## 2011-11-17 ENCOUNTER — Encounter: Payer: Medicaid Other | Admitting: Internal Medicine

## 2011-12-19 ENCOUNTER — Encounter (HOSPITAL_COMMUNITY): Payer: Self-pay | Admitting: Emergency Medicine

## 2011-12-19 ENCOUNTER — Emergency Department (HOSPITAL_COMMUNITY): Payer: Medicaid Other

## 2011-12-19 ENCOUNTER — Inpatient Hospital Stay (HOSPITAL_COMMUNITY)
Admission: EM | Admit: 2011-12-19 | Discharge: 2011-12-24 | DRG: 974 | Disposition: A | Discharge status: Skilled Nursing Facility | Payer: Medicaid Other | Attending: Internal Medicine | Admitting: Internal Medicine

## 2011-12-19 DIAGNOSIS — I428 Other cardiomyopathies: Secondary | ICD-10-CM | POA: Diagnosis present

## 2011-12-19 DIAGNOSIS — J189 Pneumonia, unspecified organism: Secondary | ICD-10-CM

## 2011-12-19 DIAGNOSIS — Z992 Dependence on renal dialysis: Secondary | ICD-10-CM

## 2011-12-19 DIAGNOSIS — R197 Diarrhea, unspecified: Secondary | ICD-10-CM | POA: Diagnosis present

## 2011-12-19 DIAGNOSIS — I429 Cardiomyopathy, unspecified: Secondary | ICD-10-CM | POA: Diagnosis present

## 2011-12-19 DIAGNOSIS — I959 Hypotension, unspecified: Secondary | ICD-10-CM | POA: Diagnosis present

## 2011-12-19 DIAGNOSIS — N039 Chronic nephritic syndrome with unspecified morphologic changes: Secondary | ICD-10-CM | POA: Diagnosis present

## 2011-12-19 DIAGNOSIS — Z833 Family history of diabetes mellitus: Secondary | ICD-10-CM

## 2011-12-19 DIAGNOSIS — Z6372 Alcoholism and drug addiction in family: Secondary | ICD-10-CM

## 2011-12-19 DIAGNOSIS — Z880 Allergy status to penicillin: Secondary | ICD-10-CM

## 2011-12-19 DIAGNOSIS — D631 Anemia in chronic kidney disease: Secondary | ICD-10-CM | POA: Diagnosis present

## 2011-12-19 DIAGNOSIS — B20 Human immunodeficiency virus [HIV] disease: Secondary | ICD-10-CM | POA: Diagnosis present

## 2011-12-19 DIAGNOSIS — R5381 Other malaise: Secondary | ICD-10-CM

## 2011-12-19 DIAGNOSIS — N186 End stage renal disease: Secondary | ICD-10-CM | POA: Diagnosis present

## 2011-12-19 DIAGNOSIS — D509 Iron deficiency anemia, unspecified: Secondary | ICD-10-CM

## 2011-12-19 HISTORY — DX: Unspecified systolic (congestive) heart failure: I50.20

## 2011-12-19 LAB — CBC WITH DIFFERENTIAL/PLATELET
Basophils Absolute: 0 10*3/uL (ref 0.0–0.1)
Basophils Relative: 0 % (ref 0–1)
Eosinophils Absolute: 0 10*3/uL (ref 0.0–0.7)
Eosinophils Relative: 0 % (ref 0–5)
HCT: 30.8 % — ABNORMAL LOW (ref 36.0–46.0)
Hemoglobin: 10.6 g/dL — ABNORMAL LOW (ref 12.0–15.0)
Lymphocytes Relative: 11 % — ABNORMAL LOW (ref 12–46)
Lymphs Abs: 1.2 10*3/uL (ref 0.7–4.0)
MCH: 36.4 pg — ABNORMAL HIGH (ref 26.0–34.0)
MCHC: 34.4 g/dL (ref 30.0–36.0)
MCV: 105.8 fL — ABNORMAL HIGH (ref 78.0–100.0)
Monocytes Absolute: 1 10*3/uL (ref 0.1–1.0)
Monocytes Relative: 9 % (ref 3–12)
Neutro Abs: 8 10*3/uL — ABNORMAL HIGH (ref 1.7–7.7)
Neutrophils Relative %: 79 % — ABNORMAL HIGH (ref 43–77)
Platelets: 197 10*3/uL (ref 150–400)
RBC: 2.91 MIL/uL — ABNORMAL LOW (ref 3.87–5.11)
RDW: 15.6 % — ABNORMAL HIGH (ref 11.5–15.5)
WBC: 10.2 10*3/uL (ref 4.0–10.5)

## 2011-12-19 LAB — COMPREHENSIVE METABOLIC PANEL
ALT: 13 U/L (ref 0–35)
AST: 27 U/L (ref 0–37)
Albumin: 2.2 g/dL — ABNORMAL LOW (ref 3.5–5.2)
Alkaline Phosphatase: 126 U/L — ABNORMAL HIGH (ref 39–117)
BUN: 79 mg/dL — ABNORMAL HIGH (ref 6–23)
CO2: 20 mEq/L (ref 19–32)
Calcium: 8.5 mg/dL (ref 8.4–10.5)
Chloride: 93 mEq/L — ABNORMAL LOW (ref 96–112)
Creatinine, Ser: 8.79 mg/dL — ABNORMAL HIGH (ref 0.50–1.10)
GFR calc Af Amer: 5 mL/min — ABNORMAL LOW (ref >90)
GFR calc non Af Amer: 4 mL/min — ABNORMAL LOW (ref >90)
Glucose, Bld: 86 mg/dL (ref 70–99)
Potassium: 4.7 mEq/L (ref 3.5–5.1)
Sodium: 132 mEq/L — ABNORMAL LOW (ref 135–145)
Total Bilirubin: 0.8 mg/dL (ref 0.3–1.2)
Total Protein: 8.6 g/dL — ABNORMAL HIGH (ref 6.0–8.3)

## 2011-12-19 LAB — INFLUENZA PANEL BY PCR (TYPE A & B)
H1N1 flu by pcr: NOT DETECTED
Influenza A By PCR: NEGATIVE
Influenza B By PCR: NEGATIVE

## 2011-12-19 LAB — LACTIC ACID, PLASMA: Lactic Acid, Venous: 0.9 mmol/L (ref 0.5–2.2)

## 2011-12-19 LAB — MRSA PCR SCREENING: MRSA by PCR: NEGATIVE

## 2011-12-19 LAB — PRO B NATRIURETIC PEPTIDE: Pro B Natriuretic peptide (BNP): 29137 pg/mL — ABNORMAL HIGH (ref 0–125)

## 2011-12-19 LAB — TROPONIN I: Troponin I: <0.3 ng/mL (ref <0.30)

## 2011-12-19 MED ORDER — ENSURE COMPLETE PO LIQD
237.0000 mL | Freq: Two times a day (BID) | ORAL | Status: DC
  Administered 2011-12-20 – 2011-12-24 (×3): 237 mL via ORAL

## 2011-12-19 MED ORDER — MIDODRINE HCL 5 MG PO TABS
10.0000 mg | ORAL_TABLET | Freq: Two times a day (BID) | ORAL | Status: DC
  Administered 2011-12-19 – 2011-12-24 (×12): 10 mg via ORAL
  Filled 2011-12-19 (×13): qty 2

## 2011-12-19 MED ORDER — B COMPLEX PO TABS
1.0000 | ORAL_TABLET | Freq: Every day | ORAL | Status: DC
  Administered 2011-12-19 – 2011-12-24 (×6): 1 via ORAL
  Filled 2011-12-19 (×6): qty 1

## 2011-12-19 MED ORDER — PARICALCITOL 5 MCG/ML IV SOLN
4.0000 ug | INTRAVENOUS | Status: DC
  Administered 2011-12-20 – 2011-12-23 (×2): 4 ug via INTRAVENOUS
  Filled 2011-12-19 (×2): qty 0.8

## 2011-12-19 MED ORDER — IPRATROPIUM BROMIDE 0.02 % IN SOLN
0.5000 mg | Freq: Four times a day (QID) | RESPIRATORY_TRACT | Status: AC
  Administered 2011-12-19 – 2011-12-20 (×3): 0.5 mg via RESPIRATORY_TRACT
  Filled 2011-12-19 (×4): qty 2.5

## 2011-12-19 MED ORDER — ONDANSETRON HCL 4 MG/2ML IJ SOLN: 4.0000 mg | Freq: Four times a day (QID) | INTRAMUSCULAR | Status: DC | PRN

## 2011-12-19 MED ORDER — DEXTROSE 5 % IV SOLN
2.0000 g | Freq: Once | INTRAVENOUS | Status: AC
  Administered 2011-12-19: 2 g via INTRAVENOUS
  Filled 2011-12-19: qty 2

## 2011-12-19 MED ORDER — ASPIRIN 81 MG PO TABS: 81.0000 mg | ORAL_TABLET | Freq: Every day | ORAL | Status: DC

## 2011-12-19 MED ORDER — NEPRO/CARBSTEADY PO LIQD
237.0000 mL | ORAL | Status: DC | PRN
  Filled 2011-12-19: qty 237

## 2011-12-19 MED ORDER — DEXTROSE 5 % IV SOLN
500.0000 mg | Freq: Two times a day (BID) | INTRAVENOUS | Status: DC
  Administered 2011-12-19 – 2011-12-21 (×4): 500 mg via INTRAVENOUS
  Filled 2011-12-19 (×6): qty 0.5

## 2011-12-19 MED ORDER — VANCOMYCIN HCL 500 MG IV SOLR
500.0000 mg | INTRAVENOUS | Status: DC
  Administered 2011-12-20: 500 mg via INTRAVENOUS
  Filled 2011-12-19: qty 500

## 2011-12-19 MED ORDER — MIDODRINE HCL 5 MG PO TABS
ORAL_TABLET | ORAL | Status: AC
  Administered 2011-12-19: 10 mg via ORAL
  Filled 2011-12-19: qty 2

## 2011-12-19 MED ORDER — HEPARIN SODIUM (PORCINE) 5000 UNIT/ML IJ SOLN
5000.0000 [IU] | Freq: Three times a day (TID) | INTRAMUSCULAR | Status: DC
  Administered 2011-12-19 – 2011-12-24 (×15): 5000 [IU] via SUBCUTANEOUS
  Filled 2011-12-19 (×20): qty 1

## 2011-12-19 MED ORDER — SODIUM CHLORIDE 0.9 % IV BOLUS (SEPSIS)
250.0000 mL | Freq: Once | INTRAVENOUS | Status: AC
  Administered 2011-12-19: 250 mL via INTRAVENOUS

## 2011-12-19 MED ORDER — LAMIVUDINE 10 MG/ML PO SOLN
50.0000 mg | Freq: Every day | ORAL | Status: DC
  Administered 2011-12-19 – 2011-12-24 (×6): 50 mg via ORAL
  Filled 2011-12-19 (×6): qty 5

## 2011-12-19 MED ORDER — ACETAMINOPHEN ER 650 MG PO TBCR: 650.0000 mg | EXTENDED_RELEASE_TABLET | Freq: Three times a day (TID) | ORAL | Status: DC

## 2011-12-19 MED ORDER — ALBUTEROL SULFATE (5 MG/ML) 0.5% IN NEBU
2.5000 mg | INHALATION_SOLUTION | Freq: Four times a day (QID) | RESPIRATORY_TRACT | Status: AC
  Administered 2011-12-19 – 2011-12-20 (×3): 2.5 mg via RESPIRATORY_TRACT
  Filled 2011-12-19 (×4): qty 0.5

## 2011-12-19 MED ORDER — ASPIRIN EC 81 MG PO TBEC
81.0000 mg | DELAYED_RELEASE_TABLET | Freq: Every day | ORAL | Status: DC
  Administered 2011-12-19 – 2011-12-24 (×6): 81 mg via ORAL
  Filled 2011-12-19 (×6): qty 1

## 2011-12-19 MED ORDER — DARUNAVIR ETHANOLATE 800 MG PO TABS
800.0000 mg | ORAL_TABLET | Freq: Every day | ORAL | Status: DC
  Administered 2011-12-19 – 2011-12-24 (×6): 800 mg via ORAL
  Filled 2011-12-19 (×8): qty 1

## 2011-12-19 MED ORDER — STAVUDINE 15 MG PO CAPS
15.0000 mg | ORAL_CAPSULE | Freq: Every day | ORAL | Status: DC
  Administered 2011-12-19 – 2011-12-21 (×3): 15 mg via ORAL
  Filled 2011-12-19 (×3): qty 1

## 2011-12-19 MED ORDER — LEVOFLOXACIN IN D5W 500 MG/100ML IV SOLN: 500.0000 mg | INTRAVENOUS | Status: DC

## 2011-12-19 MED ORDER — DARBEPOETIN ALFA-POLYSORBATE 25 MCG/0.42ML IJ SOLN
25.0000 ug | INTRAMUSCULAR | Status: DC
  Administered 2011-12-20: 25 ug via INTRAVENOUS
  Filled 2011-12-19: qty 0.42

## 2011-12-19 MED ORDER — LEVOFLOXACIN IN D5W 750 MG/150ML IV SOLN
750.0000 mg | Freq: Once | INTRAVENOUS | Status: AC
  Administered 2011-12-19: 750 mg via INTRAVENOUS

## 2011-12-19 MED ORDER — VANCOMYCIN HCL 500 MG IV SOLR: 500.0000 mg | INTRAVENOUS | Status: DC

## 2011-12-19 MED ORDER — LEVOFLOXACIN IN D5W 750 MG/150ML IV SOLN
750.0000 mg | INTRAVENOUS | Status: DC
  Filled 2011-12-19: qty 150

## 2011-12-19 MED ORDER — VANCOMYCIN HCL IN DEXTROSE 1-5 GM/200ML-% IV SOLN
1000.0000 mg | Freq: Once | INTRAVENOUS | Status: AC
  Administered 2011-12-19: 1000 mg via INTRAVENOUS
  Filled 2011-12-19: qty 200

## 2011-12-19 MED ORDER — ONDANSETRON HCL 4 MG PO TABS: 4.0000 mg | ORAL_TABLET | Freq: Four times a day (QID) | ORAL | Status: DC | PRN

## 2011-12-19 MED ORDER — DEXTROSE 5 % IV SOLN: 2.0000 g | Freq: Three times a day (TID) | INTRAVENOUS | Status: DC

## 2011-12-19 MED ORDER — ALBUTEROL SULFATE 0.63 MG/3ML IN NEBU: 0.6300 mg | INHALATION_SOLUTION | Freq: Four times a day (QID) | RESPIRATORY_TRACT | Status: DC

## 2011-12-19 MED ORDER — SODIUM CHLORIDE 0.9 % IV BOLUS (SEPSIS)
500.0000 mL | Freq: Once | INTRAVENOUS | Status: AC
  Administered 2011-12-19: 500 mL via INTRAVENOUS

## 2011-12-19 MED ORDER — RENA-VITE PO TABS
1.0000 | ORAL_TABLET | Freq: Every day | ORAL | Status: DC
  Administered 2011-12-19 – 2011-12-24 (×6): 1 via ORAL
  Filled 2011-12-19 (×6): qty 1

## 2011-12-19 MED ORDER — SODIUM CHLORIDE 0.9 % IV SOLN
400.0000 mg | INTRAVENOUS | Status: DC
  Administered 2011-12-19: 400 mg via INTRAVENOUS
  Filled 2011-12-19: qty 8

## 2011-12-19 MED ORDER — ACETAMINOPHEN 325 MG PO TABS
650.0000 mg | ORAL_TABLET | Freq: Four times a day (QID) | ORAL | Status: DC | PRN
  Administered 2011-12-19 – 2011-12-21 (×6): 650 mg via ORAL
  Filled 2011-12-19 (×6): qty 2

## 2011-12-19 MED ORDER — RITONAVIR 100 MG PO TABS
100.0000 mg | ORAL_TABLET | Freq: Every day | ORAL | Status: DC
  Administered 2011-12-19 – 2011-12-24 (×6): 100 mg via ORAL
  Filled 2011-12-19 (×9): qty 1

## 2011-12-19 MED ORDER — ASPIRIN EC 325 MG PO TBEC
325.0000 mg | DELAYED_RELEASE_TABLET | Freq: Once | ORAL | Status: AC
  Administered 2011-12-19: 325 mg via ORAL
  Filled 2011-12-19: qty 1

## 2011-12-19 NOTE — Consult Note (Signed)
I have personally seen and examined this patient and agree with the assessment/plan as outlined above by Ramiro Harvest PA. [name] is unfortunately admitted for fever and cough that seems to be from HCAP- on vancomycin and levofloxacin. She has underlying HIV infection with fair CD4 count of 300 at last check. She currently feels better and is being dialyzed today in lieu of yesterday. She has watery stool and is being checked for C difficile colitis (she has a history of this- last in august of this year). MRSA PCR is surprisingly negative.  Jarvis Sawa K.,MD 12/19/2011 3:11 PM

## 2011-12-19 NOTE — Procedures (Signed)
Patient seen on Hemodialysis. QB 400, UF goal 3570mL Treatment adjusted as needed.  Elmarie Shiley MD [facility]. Office # [phone] Pager # [phone] 3:10 PM

## 2011-12-19 NOTE — Consult Note (Signed)
[facility] Renal Consultation Note  Indication for Consultation:  Management of ESRD/hemodialysis; anemia, hypertension/volume and secondary hyperparathyroidism  HPI: [name] is a 56 y.o. female with ESRD on dialysis on TTS at the [facility] who was sent from [facility] last night to the ER with nonproductive cough and fatigue for two days and sharp left side chest pain, mostly with cough, since yesterday.  Last night at the SNF she had fever of 103.3 and chills before she was sent to the ER.  She missed her dialysis yesterday when she did not feel well enough to go.  Chest x-ray showed airspace infiltration in the left mid lung suggesting pneumonia.  Her dry weight has been increased from 58 kg to 61 kg over the last month, and her post-dialysis weight on 12/10 was still 1.3 L over her dry weight.  She is currently receiving IV Levaquin and Vancomycin and is afebrile, but had one episode of watery diarrhea this morning.  She states that her roommate at the nursing home has recently been coughing and refused the flu shot, although she received hers.  Dialysis Orders: Center: [facility] on TTS. EDW 61 kg  HD Bath 2K/2.25Ca  Time 4 hrs  Heparin 5900 U. Access AVG @ RUA  BFR 400 DFR 800    Zemplar 4 mcg IV/HD   Epogen 13,400 Units IV/HD  Venofer 50 mg on Thurs.   Past Medical History  Diagnosis Date  . Human immunodeficiency virus (HIV) disease   . Hypertension   . ESRD (end stage renal disease)   . Dialysis patient     pt on dialysis since 2010.  [name] Clostridium difficile infection 04/04/10    07/2011 and 08/2011  . Bacteriuria, asymptomatic 04/04/10    Culture grew VRE sensitive to linesolid   . MGUS (monoclonal gammopathy of unknown significance)   . History of bacteremia     MSSA  . Renal insufficiency   . Anemia   . Bacteremia     05/2011 and 08/2011   Past Surgical History  Procedure Date  . Av fistula placement   . Insertion of dialysis catheter  12/30/2010    Procedure: INSERTION OF DIALYSIS CATHETER;  Surgeon: Elam Dutch, MD;  Location: [place];  Service: Vascular;  Laterality: Left;  Insertion of left internal jugular dialysis catheter  . Vascular surgery   . Insertion of dialysis catheter 05/26/2011    Procedure: INSERTION OF DIALYSIS CATHETER;  Surgeon: Conrad [place], MD;  Location: [place];  Service: Vascular;  Laterality: N/A;  Insertion tunneled dialysis catheter in Right Internal Jugular with 23 cm catheter   . Insertion of dialysis catheter 08/26/2011    Procedure: INSERTION OF DIALYSIS CATHETER;  Surgeon: Elam Dutch, MD;  Location: [facility] OR;  Service: Vascular;  Laterality: Right;  insertion of Right Femoral vein Dialysis catheter  . Tee without cardioversion 08/27/2011    Procedure: TRANSESOPHAGEAL ECHOCARDIOGRAM (TEE);  Surgeon: Pixie Casino, MD;  Location: [place];  Service: Cardiovascular;  Laterality: N/A;  . Av fistula placement 09/01/2011    Procedure: INSERTION OF ARTERIOVENOUS (AV) GORE-TEX GRAFT ARM;  Surgeon: Rosetta Posner, MD;  Location: [facility] OR;  Service: Vascular;  Laterality: Right;  Using 32mm x 50  stretch goretex graft   Family History  Problem Relation Age of Onset  . Alcohol abuse      family h/o addiction/alcoholism  . Diabetes Cousin     first degree relatives  .  Kidney disease Mother   . Kidney disease Maternal Uncle   . Cancer Sister    Social History She reports that she smoked cigarettes heavily until she quit in 2002, but denies any history of alcohol or illicit drug use.  She previously worked for [employer].  Allergies  Allergen Reactions  . Penicillins Rash    Unknown reaction   Prior to Admission medications   Medication Sig Start Date End Date Taking? Authorizing Provider  aspirin 81 MG tablet Take 81 mg by mouth daily.     Yes Historical Provider, MD  b complex vitamins tablet Take 1 tablet by mouth daily.     Yes Historical Provider, MD  b complex-vitamin c-folic acid  (NEPHRO-VITE) 0.8 MG TABS Take 0.8 mg by mouth daily.     Yes Historical Provider, MD  Darunavir Ethanolate (PREZISTA) 800 MG tablet Take 1 tablet (800 mg total) by mouth daily with breakfast. 09/02/11  Yes Cresenciano Genre, MD  lamiVUDine (EPIVIR) 10 MG/ML solution Take 5 mLs (50 mg total) by mouth daily. 09/02/11 09/01/12 Yes Cresenciano Genre, MD  midodrine (PROAMATINE) 10 MG tablet Take 10 mg by mouth 2 (two) times daily at 10 AM and 5 PM.   Yes Historical Provider, MD  oxyCODONE (OXY IR/ROXICODONE) 5 MG immediate release tablet Take 5 mg by mouth every 6 (six) hours as needed. For pain   Yes Historical Provider, MD  ritonavir (NORVIR) 100 MG TABS Take 1 tablet (100 mg total) by mouth daily with breakfast. 09/02/11  Yes Cresenciano Genre, MD  stavudine (ZERIT) 15 MG capsule Take 1 capsule (15 mg total) by mouth daily. 09/02/11 09/01/12 Yes Cresenciano Genre, MD  feeding supplement (ENSURE COMPLETE) LIQD Take 237 mLs by mouth 2 (two) times daily between meals. 05/30/11   Charlann Lange, MD  Nutritional Supplements (FEEDING SUPPLEMENT, NEPRO CARB STEADY,) LIQD Take 237 mLs by mouth as needed (missed meal during dialysis.). 05/30/11   Charlann Lange, MD   Labs:  Results for orders placed during the hospital encounter of 12/19/11 (from the past 48 hour(s))  PRO B NATRIURETIC PEPTIDE     Status: Abnormal   Collection Time   12/19/11  2:36 AM      Component Value Range Comment   Pro B Natriuretic peptide (BNP) 29137.0 (*) 0 - 125 pg/mL   COMPREHENSIVE METABOLIC PANEL     Status: Abnormal   Collection Time   12/19/11  2:53 AM      Component Value Range Comment   Sodium 132 (*) 135 - 145 mEq/L    Potassium 4.7  3.5 - 5.1 mEq/L    Chloride 93 (*) 96 - 112 mEq/L    CO2 20  19 - 32 mEq/L    Glucose, Bld 86  70 - 99 mg/dL    BUN 79 (*) 6 - 23 mg/dL    Creatinine, Ser 8.79 (*) 0.50 - 1.10 mg/dL    Calcium 8.5  8.4 - 10.5 mg/dL    Total Protein 8.6 (*) 6.0 - 8.3 g/dL    Albumin 2.2 (*) 3.5 - 5.2 g/dL    AST 27  0 - 37 U/L    ALT  13  0 - 35 U/L    Alkaline Phosphatase 126 (*) 39 - 117 U/L    Total Bilirubin 0.8  0.3 - 1.2 mg/dL    GFR calc non Af Amer 4 (*) >90 mL/min    GFR calc Af Amer 5 (*) >90 mL/min  CBC WITH DIFFERENTIAL     Status: Abnormal   Collection Time   12/19/11  2:53 AM      Component Value Range Comment   WBC 10.2  4.0 - 10.5 K/uL    RBC 2.91 (*) 3.87 - 5.11 MIL/uL    Hemoglobin 10.6 (*) 12.0 - 15.0 g/dL    HCT 30.8 (*) 36.0 - 46.0 %    MCV 105.8 (*) 78.0 - 100.0 fL    MCH 36.4 (*) 26.0 - 34.0 pg    MCHC 34.4  30.0 - 36.0 g/dL    RDW 15.6 (*) 11.5 - 15.5 %    Platelets 197  150 - 400 K/uL    Neutrophils Relative 79 (*) 43 - 77 %    Neutro Abs 8.0 (*) 1.7 - 7.7 K/uL    Lymphocytes Relative 11 (*) 12 - 46 %    Lymphs Abs 1.2  0.7 - 4.0 K/uL    Monocytes Relative 9  3 - 12 %    Monocytes Absolute 1.0  0.1 - 1.0 K/uL    Eosinophils Relative 0  0 - 5 %    Eosinophils Absolute 0.0  0.0 - 0.7 K/uL    Basophils Relative 0  0 - 1 %    Basophils Absolute 0.0  0.0 - 0.1 K/uL   LACTIC ACID, PLASMA     Status: Normal   Collection Time   12/19/11  3:53 AM      Component Value Range Comment   Lactic Acid, Venous 0.9  0.5 - 2.2 mmol/L   TROPONIN I     Status: Normal   Collection Time   12/19/11  5:18 AM      Component Value Range Comment   Troponin I <0.30  <0.30 ng/mL   MRSA PCR SCREENING     Status: Normal   Collection Time   12/19/11  8:29 AM      Component Value Range Comment   MRSA by PCR NEGATIVE  NEGATIVE    Constitutional: positive for chills, fatigue and fevers, negative for sweats Ears, nose, mouth, throat, and face: negative for earaches, hoarseness, nasal congestion and sore throat Respiratory: positive for cough and pleurisy/chest pain, negative for hemoptysis and sputum Cardiovascular: positive for chest pain and dyspnea, negative for orthopnea and palpitations Gastrointestinal: positive for diarrhea once this AM; negative for abdominal pain, change in bowel habits, nausea and  vomiting Genitourinary:negative, anuric Musculoskeletal:negative for arthralgias, back pain, myalgias and neck pain Neurological: negative for dizziness, headaches, paresthesia and speech problems  Physical Exam: Filed Vitals:   12/19/11 1100  BP: 113/70  Pulse: 73  Temp: 98.2 F (36.8 C)  Resp: 16     General appearance: alert, cooperative and no distress Head: Normocephalic, without obvious abnormality, atraumatic Neck: no adenopathy, no carotid bruit, no JVD and supple, symmetrical, trachea midline Resp: Expiratory rhonchi bilaterally Cardio: RRR with Gr II/VI systolic murmur GI: soft, non-tender; bowel sounds normal; no masses,  no organomegaly Extremities: extremities normal, atraumatic, no cyanosis or edema Neurologic: Grossly normal Dialysis Access: AVG @ RUA with + bruit   Assessment/Plan: 1. Fever/cough/chest pain - believed to be secondary to left pneumonia per chest x-ray; started IV Levaquin and Vancomycin; right IJ catheter removed 10/14, but Hx VRE bacteremia (most recently in 08/2011); also one episode of diarrhea this AM with Hx C difficile (most recently in 08/2011). 2. ESRD -  HD on TTS @ [place]; last HD on 12/10; K 4.7.  HD pending today. 3. Hypotension/volume  -  BP 113/70 on Midodrine 10 mg bid; current wt 67.6 kg with EDW 61 kg.  UF goal of 3 L today, 3 L tomorrow. 4. Anemia  - Hgb 10.6 on outpatient Epogen 13,400 U and IV Fe (Thurs), but last T-sat 44%.  Follow CBC tomorrow to determine Aranesp. 5. Metabolic bone disease -  Ca 8.5 (9.9 corrected), last P 3.5 on 11/21, last iPTH 163 on 10/24; on Zemplar 4 mcg, Phoslo 1 with meals. 6. Nutrition - Alb 2.2, high protein renal diet. 7. HIV - on antiviral meds per ID, last CD4 310 and viral load 367 in 10/2011.  Loranda Mastel 12/19/2011, 1:35 PM   Attending Nephrologist: Elmarie Shiley, MD

## 2011-12-19 NOTE — Progress Notes (Signed)
Subjective: Patient had 1 episode of diarrhea of loose diarrhea.  She states chest pain is resolved. She still has nonproductive cough.  She denies chills, fever as of now.  She states her last HD was Tuesday prior to admission because she was not feeling well to get it yesterday.  Objective: Vital signs in last 24 hours: Filed Vitals:   12/19/11 0820 12/19/11 0925 12/19/11 1000 12/19/11 1100  BP: 107/61 94/53 124/77 113/70  Pulse: 71 74 69 73  Temp: 97.8 F (36.6 C)   98.2 F (36.8 C)  TempSrc: Oral   Oral  Resp: 20 21 20 16   Height: 5\' 4"  (1.626 m)     Weight: 149 lb 0.5 oz (67.6 kg)     SpO2: 99% 100% 100% 100%   General:lying in bed, nad, alert and oriented x 3 on 2L [place] HEENT: [place]/at. Face looks edematous  CV: RRR no rubs/murmurs noted  Lungs: wheezes b/l, mild crackles b/l  Abdomen: soft, ntnd, normal bowel sounds Extremities: no cyanosis or edema, right upper extremity with palpable thrill AVG Skin: no evidence of breakdown Neuro: CN 2-12 grossly intact, moving all 4 extremities   Lab Results: Basic Metabolic Panel:  Lab [id number] 0253  NA 132*  K 4.7  CL 93*  CO2 20  GLUCOSE 86  BUN 79*  CREATININE 8.79*  CALCIUM 8.5  MG --  PHOS --   Liver Function Tests:  Lab 12/19/11 0253  AST 27  ALT 13  ALKPHOS 126*  BILITOT 0.8  PROT 8.6*  ALBUMIN 2.2*   CBC:  Lab 12/19/11 0253  WBC 10.2  NEUTROABS 8.0*  HGB 10.6*  HCT 30.8*  MCV 105.8*  PLT 197   Cardiac Enzymes:  Lab 12/19/11 0518  CKTOTAL --  CKMB --  CKMBINDEX --  TROPONINI <0.30   BNP:  Lab 12/19/11 0236  PROBNP 29137.0*   Misc. Labs: CD 4, viral load  Micro Results: Recent Results (from the past 240 hour(s))  MRSA PCR SCREENING     Status: Normal   Collection Time   12/19/11  8:29 AM      Component Value Range Status Comment   MRSA by PCR NEGATIVE  NEGATIVE Final    Studies/Results: Dg Chest Port 1 View  12/19/2011  *RADIOLOGY REPORT*  Clinical Data: Chest pain for 2 days.   Fever.  Diaphoresis.  PORTABLE CHEST - 1 VIEW  Comparison: 07/11/2011  Findings: Shallow inspiration.  Cardiac enlargement with normal pulmonary vascularity.  Left perihilar airspace infiltration suggesting pneumonia.  No blunting of costophrenic angles.  No pneumothorax.  Surgical clips in the left axilla.  IMPRESSION: Cardiac enlargement.  Airspace infiltration in the left mid lung suggesting pneumonia.   Original Report Authenticated By: Lucienne Capers, M.D.    Medications:  Scheduled Meds:    . aspirin EC  81 mg Oral Daily  . aztreonam  500 mg Intravenous Q12H  . b complex vitamins  1 tablet Oral Daily  . Darunavir Ethanolate  800 mg Oral Q breakfast  . feeding supplement  237 mL Oral BID BM  . heparin  5,000 Units Subcutaneous Q8H  . lamiVUDine  50 mg Oral Daily  . levofloxacin (LEVAQUIN) IV  500 mg Intravenous Q48H  . midodrine  10 mg Oral BID AC  . multivitamin  1 tablet Oral Daily  . ritonavir  100 mg Oral Q breakfast  . stavudine  15 mg Oral Daily  . vancomycin  500 mg Intravenous Q T,Th,Sa-HD   Continuous Infusions:  PRN Meds:.feeding supplement (NEPRO CARB STEADY), ondansetron (ZOFRAN) IV, ondansetron Assessment/Plan: Ms. 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  She presented with dry cough of over one week and chest pain which is now resolved.  CXR with Left mid lung infiltrate, febrile with temp of 101.104F, hypotension and WBC of 10.2 (was 4.6 in 10/22/11)    1. HCAP:  She is febrile with temp of 103.869F at the SNF and 101.104F here. Her WBC is 10.2 but has increased from her baseline of 4.6. Her BP is low at 90/46 but she has chronically low BP, she is not tachycardic or tachypneic on 2 L . Her lactic acid level is not elevated, 0.9. She is on HD and a  nursing home patient making healthcare associated pneumonia likely.  She was also exposed to her roommate at SNF who had been coughing. She was initially treated in the ED with Vancomycin, Aztreonam, and Levaquin. However, given her history of VRE, Vancomycin was discontinued and Daptomycin was started.  Daptomycin does not penetrate the lungs so will resume Vancomycin per pharmacy.   Plan  -changed Dapto to Vanco per pharmacy, continue Aztreonam and Levaquin  -ID consult-Dr. Tommy Medal, appreciate recs -Tylenol for fever  -Blood cultures x 2 were drawn prior to abx and are pending -pending flu nasal swab -Atrovent/Albuterol nebs q 6 x 4 doses    2. Chest pain  -resolved possibly pleuritic due to pneumonia. Trop negative x 1.  BNP elevated in the setting of history of cardiomyopathy, female sex and ESRD.    3. ESRD on HD T/Th/S -Last HD session was Tuesday prior to admission.  -outpatient HD center is at [facility], her Nephrologist is Dr. Moshe Cipro. Her pro-BNP is 29,137 but in the context of ESRD. Her AVG has a good thrill and bruit. She has history of transient low BP after HD and is on midodrine at home.  [facility], appreciate recs.  She may need HD today depending on their recs.   -Continue midodrine for intermittent hypotension.   4. Hypotension -monitor BP.  Patient runs hypotensive with HD -continue Midodrine.    5. Diarrhea -contact precautions -pending stool studies (C.diff, O&P, stool culture)  6. HIV:  -last CD4 310 and VL 367 copies in 10/2011.  -Followed by Dr. Veryl Speak in [place] clinic. Has history of medication noncompliance.  -will repeat cd 4 count and viral load  -Continue norvir, zerit, and prezista, Epivir  7. F/E/N -careful if needs fluid bolus history of systolic dysfunction -will follow labs (renal panel) -diet ordered     Dispo: Disposition is deferred at this time, awaiting improvement of current medical problems. Anticipated  discharge in approximately 2-3 day(s).   The patient does have a current PCP (GARG, ANKIT, MD), therefore will be requiring OPC follow-up after discharge.  The patient does not have transportation limitations that hinder transportation to clinic appointments.   Services Needed at time of discharge: Y = Yes, Blank = No PT:   OT:   RN:   Equipment:   Other:     LOS: 0 days   Cresenciano Genre [id number] 12/19/2011, 1:25 PM

## 2011-12-19 NOTE — Clinical Social Work Psychosocial (Signed)
     Clinical Social Work Department BRIEF PSYCHOSOCIAL ASSESSMENT 12/19/2011  Patient:  [name], [name]     Account Number:  [id number]     Admit date:  12/19/2011  Clinical Social Worker:  Valda Lamb  Date/Time:  12/19/2011 12:08 PM  Referred by:  RN  Date Referred:  12/19/2011 Referred for  SNF Placement   Other Referral:   Interview type:  Patient Other interview type:    PSYCHOSOCIAL DATA Living Status:  FACILITY Admitted from facility:  [facility] Level of care:  [place] Primary support name:  Marsheila Teale [id number] Primary support relationship to patient:  CHILD, ADULT Degree of support available:   Pt daughter and sisters present in pt room. Pt appears to have [name] lot of support.    CURRENT CONCERNS Current Concerns  Post-Acute Placement   Other Concerns:    SOCIAL WORK ASSESSMENT / PLAN CSW informed that pt admitted from [facility].    CSW visited pt room and introduced herself and role. CSW confirmed with pt that she was admitted from [facility] and the plan was for her to return when medically ready. CSW informed pt that CSW would facilitate with dc planning when pt became stable.    CSW confirmed with Mendel Corning that pt able to return when medically stable.   Assessment/plan status:  Psychosocial Support/Ongoing Assessment of Needs Other assessment/ plan:   Information/referral to community resources:   No resources needed at this time.    PATIENTS/FAMILYS RESPONSE TO PLAN OF CARE: Pt laying in bed alert and oriented. Pt had 2 sisters and her daughters present during assessment. Pt confirmed that she will return to [facility] when medically ready.    CSW to follow.

## 2011-12-19 NOTE — H&P (Signed)
Hospital Admission Note Date: 12/19/2011  Patient name: [name] Medical record number: [id number] Date of birth: June 22, 1955 Age: 56 y.o. Gender: female PCP: Janell Quiet, MD  Medical Service: Internal Medicine Teaching Service  Attending physician:  Dr. Ellwood Dense    1st Contact:  Dr. Aundra Dubin   Pager: [phone] 2nd Contact:  Dr. 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 The chest pain is described as midchest, substernal, sharp, 10/10 in severity, it does not radiated, lasting for hours and present only with her cough. She had Nitro 3 times with no relief of her chest pain. Her chest pain is currently gone as she is no longer coughing. Her cough is described as nonproductive, intermittent, and has been present for more than one week.  She reports that her roommate at SNF also has a cough. She had her flu shot on 10/22/11.  Per her SNF notes she also had facial puffiness but the patient complains that she was not given a mirror and does not know if her face was indeed swollen. At the SNF she was found to be febrile with temp of 103.52F, she was given Tylenol and transported to the [facility] ED .   She denies dizziness, headache, SOB, N/V, abdominal pain, diarrhea or constipation.  She has had fever and chills.  She is anuric. At her baseline she "walks" with a wheelchair.   Meds: Current Outpatient Rx  Name  Route  Sig  Dispense  Refill  . ASPIRIN 81 MG PO TABS   Oral   Take 81 mg by mouth daily.           . B COMPLEX PO TABS    Oral   Take 1 tablet by mouth daily.           [name] NEPHRO-VITE 0.8 MG PO TABS   Oral   Take 0.8 mg by mouth daily.           [name] DARUNAVIR ETHANOLATE 800 MG PO TABS   Oral   Take 1 tablet (800 mg total) by mouth daily with breakfast.   30 tablet   0   . LAMIVUDINE 10 MG/ML PO SOLN   Oral   Take 5 mLs (50 mg total) by mouth daily.   240 mL      . MIDODRINE HCL 10 MG PO TABS   Oral   Take 10 mg by mouth 2 (two) times daily at 10 AM and 5 PM.         . OXYCODONE HCL 5 MG PO TABS   Oral   Take 5 mg by mouth every 6 (six) hours as needed. For pain         . RITONAVIR 100 MG PO TABS   Oral   Take 1 tablet (100 mg total) by mouth daily with breakfast.   30 tablet   0   . STAVUDINE 15 MG PO CAPS   Oral  Take 1 capsule (15 mg total) by mouth daily.         [name] ENSURE COMPLETE PO LIQD   Oral   Take 237 mLs by mouth 2 (two) times daily between meals.   60 Bottle   11   . NEPRO/CARB STEADY PO LIQD   Oral   Take 237 mLs by mouth as needed (missed meal during dialysis.).   30 Can   11     Allergies: Allergies as of 12/19/2011 - Review Complete 12/19/2011  Allergen Reaction Noted  . Penicillins Rash 12/23/2010   Past Medical History  Diagnosis Date  . Human immunodeficiency virus (HIV) disease   . Hypertension   . ESRD (end stage renal disease)   . Dialysis patient     pt on dialysis since 2010.  [name] Clostridium difficile infection 04/04/10    07/2011 and 08/2011  . Bacteriuria, asymptomatic 04/04/10    Culture grew VRE sensitive to linesolid   . MGUS (monoclonal gammopathy of unknown significance)   . History of bacteremia     MSSA  . Renal insufficiency   . Anemia   . Bacteremia     05/2011 and 08/2011   Past Surgical History  Procedure Date  . Av fistula placement   . Insertion of dialysis catheter 12/30/2010    Procedure: INSERTION OF DIALYSIS CATHETER;  Surgeon: Elam Dutch, MD;  Location: [place];  Service: Vascular;  Laterality: Left;  Insertion  of left internal jugular dialysis catheter  . Vascular surgery   . Insertion of dialysis catheter 05/26/2011    Procedure: INSERTION OF DIALYSIS CATHETER;  Surgeon: Conrad Busby, MD;  Location: [place];  Service: Vascular;  Laterality: N/A;  Insertion tunneled dialysis catheter in Right Internal Jugular with 23 cm catheter   . Insertion of dialysis catheter 08/26/2011    Procedure: INSERTION OF DIALYSIS CATHETER;  Surgeon: Elam Dutch, MD;  Location: [facility] OR;  Service: Vascular;  Laterality: Right;  insertion of Right Femoral vein Dialysis catheter  . Tee without cardioversion 08/27/2011    Procedure: TRANSESOPHAGEAL ECHOCARDIOGRAM (TEE);  Surgeon: Pixie Casino, MD;  Location: [place];  Service: Cardiovascular;  Laterality: N/A;  . Av fistula placement 09/01/2011    Procedure: INSERTION OF ARTERIOVENOUS (AV) GORE-TEX GRAFT ARM;  Surgeon: Rosetta Posner, MD;  Location: [facility];  Service: Vascular;  Laterality: Right;  Using 74mm x 50  stretch goretex graft   Family History  Problem Relation Age of Onset  . Alcohol abuse      family h/o addiction/alcoholism  . Diabetes Cousin     first degree relatives  . Kidney disease Mother   . Kidney disease Maternal Uncle   . Cancer Sister    History   Social History  . Marital Status: Single    Spouse Name: N/A    Number of Children: N/A  . Years of Education: N/A   Occupational History  . Not on file.   Social History Main Topics  . Smoking status: Never Smoker   . Smokeless tobacco: Never Used  . Alcohol Use: No  . Drug Use: No  . Sexually Active: No   Other Topics Concern  . Not on file   Social History Narrative   Lives with daughter(does not know dx). Oldest daughter does not dx.Current partner of may years,? fidelity    Review of Systems: Constitutional: +fever, +chills, +diaphoresis, appetite change and fatigue.  HEENT: Denies photophobia, eye pain, redness, hearing loss, ear pain,  congestion, sore throat, rhinorrhea,  sneezing, mouth sores, trouble swallowing, neck pain, neck stiffness and tinnitus.  Respiratory: Denies SOB, DOE, +cough, chest tightness, and wheezing.  Cardiovascular: +chest pain, palpitations and leg swelling.  Gastrointestinal: Denies nausea, vomiting, abdominal pain, diarrhea, constipation, blood in stool and abdominal distention.  Genitourinary: Denies dysuria, urgency, frequency, hematuria, flank pain and difficulty urinating.  Musculoskeletal: Denies myalgias, back pain, joint swelling, arthralgias and gait problem.  Skin: Denies pallor, rash and wound.  Neurological: Denies dizziness, seizures, syncope, weakness, light-headedness, numbness and headaches.  Hematological: Denies adenopathy. Easy bruising, personal or family bleeding history  Psychiatric/Behavioral: Denies suicidal ideation, mood changes, confusion, nervousness, sleep disturbance and agitation   Physical Exam: Blood pressure 87/46, pulse 82, temperature 101.2 F (38.4 C), temperature source Oral, resp. rate 12, SpO2 100.00%. General: pleasant, alert, and cooperative to examination, appears much older than stated age. Facial puffiness but baseline unclear. No lip swelling.  Head: normocephalic and atraumatic.  Eyes: vision grossly intact, pupils equal, pupils round, pupils reactive to light, no injection and anicteric.  Mouth: pharynx pink and moist, no erythema, and no exudates.  Neck: supple, full ROM, no thyromegaly, no JVD, and no carotid bruits.  Lungs: expiratory and inspiratory rhonchi throughout, normal respiratory effort, no accessory muscle use, no crackles, and no wheezes. Heart: normal rate, regular rhythm, 2/6 early systolic murmur heard best at the LLSB radiating to the apex, no gallop, and no rub.  Abdomen: soft, non-tender, normal bowel sounds, no distention, no guarding, no rebound tenderness, no hepatomegaly, and no splenomegaly.  Msk: no joint swelling, no joint warmth, and no redness over joints.   Pulses: 2+ DP/PT pulses bilaterally Extremities: Right arm AVG with palpable thrill and noticeable bruit with no surrounding edema or erythema. No cyanosis, clubbing, edema. Toe nails long.  Neurologic: alert & oriented X3, cranial nerves II-XII intact, strength normal in all extremities, sensation intact to light touch Skin: turgor normal and no rashes.  Psych: Appears a little confused which is her baseline per SNF records, normally interactive, good eye contact, not anxious appearing, and not depressed appearing.   Lab results: Basic Metabolic Panel:  [facility] 12/19/11 0253  NA 132*  K 4.7  CL 93*  CO2 20  GLUCOSE 86  BUN 79*  CREATININE 8.79*  CALCIUM 8.5  MG --  PHOS --   Liver Function Tests:  [facility] 12/19/11 0253  AST 27  ALT 13  ALKPHOS 126*  BILITOT 0.8  PROT 8.6*  ALBUMIN 2.2*   CBC:  Basename 12/19/11 0253  WBC 10.2  NEUTROABS 8.0*  HGB 10.6*  HCT 30.8*  MCV 105.8*  PLT 197    Basename 12/19/11 0236  PROBNP [id number]*    Imaging results:  Dg Chest Port 1 View  12/19/2011  *RADIOLOGY REPORT*  Clinical Data: Chest pain for 2 days.  Fever.  Diaphoresis.  PORTABLE CHEST - 1 VIEW  Comparison: 07/11/2011  Findings: Shallow inspiration.  Cardiac enlargement with normal pulmonary vascularity.  Left perihilar airspace infiltration suggesting pneumonia.  No blunting of costophrenic angles.  No pneumothorax.  Surgical clips in the left axilla.  IMPRESSION: Cardiac enlargement.  Airspace infiltration in the left mid lung suggesting pneumonia.   Original Report Authenticated By: Lucienne Capers, M.D.     Other results: EKG: NSR, Left axis deviation, possible left atrial enlargement, normal PR and QRS intervals, T wave inversion in lateral leads which is seen in previous tracings.   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 HCAP: She is febrile with temp of 103.34F at the SNF and 101.19F here. Her WBC is 10.2 but has increased from her baseline of 4.6. Her BP is low at 90/46 but she has chronically low BP, she is not tachycardic. Her lactic acid level is not elevated, 0.9. She is on HD making healthcare associated pneumonia, CAP is also possible, however, given her exposed to her roommate at SNF. She has history of right IJ catheter in 08/23/11 removed secondary to VRE bacteremia. She also has had VRE bacteremia in 3/12, and MSSA bacteremia in 4/13. She was initially treated in the ED with Vancomycin, Aztreonam, and Levaquin. However, given her history of VRE, Vancomycin was discontinued and Daptomycin was started.  -admit to SDU -Start Daptomycin given she had VRE bacteremia in 08/2011.   -Continue Aztreonam and Levaquin -Consult ID in the morning  -Tylenol for fever -Blood cultures x 2 were drawn prior to abx   2. ESRD on HD T/T/S: It is unclear when she had HD last. Per her reports, her last session was on Tuesday, but per nursing home notes she had dialysis on Thursday. Her outpatient HD center is at [facility], her Nephrologist is Dr. Moshe Cipro. Her pro-BNP is 29,137 but in the context of ESRD. Her AVG has a good thrill and bruit. She has hx of transient low BP after HD and is on midodrine at home.  -Consult Nephrology in AM -Contact HD center for clarification on when the pt last her HD -Continue midodrine for intermittent hypotension.   3. HIV: last CD4 310 and VL 367 copies in 10/2011.  Followed by Dr. Drucilla Schmidt. Had hx of medication noncompliance. -Continue norvir, zerit, and prezista  4. Macrocytic anemia. Probably concomitant with Anemia of Chronic disease. Stable. Her baseline hemoglobin is between 8-10, She is at 10.6  today. In August her ferritin was high at 798, with  consistent with anemia of chronic disease. However, she in on stavudine which can cause macrocytic anemia. Her vitamin B12 is normal at 554.  -Continue to monitor -CBC daily   Dispo: Disposition is deferred at this time, awaiting improvement of current medical problems. Anticipated discharge in approximately 1-2 day(s).   The patient does have a current PCP (GARG, ANKIT, MD), therefore will be requiring OPC follow-up after discharge.   The patient does not have transportation limitations that hinder transportation to clinic appointments.  Signed: Blain Pais 12/19/2011, 5:00 AM

## 2011-12-19 NOTE — Progress Notes (Signed)
Received patient from ED.  Patient placed on monitor and VVS and patient with no c/o pain.  Patient oriented to unit and routines. Will continue to monitor.

## 2011-12-19 NOTE — H&P (Addendum)
Internal Medicine Teaching Service Attending Note Date: 12/19/2011  Patient name: [name]  Medical record number: [id number]  Date of birth: 09-11-1955   I have discussed this case with my team and evaluated the patient.   History of Present Illness The patient, [name], has a history of HIV/AIDS, ESRD on HD, CDiff in 2012, and VRE isolation in blood in 8/13 due to a possible catheter infection. She is a 56 y.o. year old female who comes from SNF with the chief complaint of chest pain along with cough and fever of 103.3 F since yesterday. She has a sick roommate who has been coughing. I have read the HPI by Dr. Hayes Ludwig and I agree with it. When I met with her, she said her chest pain had resolved, she had no fever in the past 24 hours since admission and she still had cough.   Past Medical History   has a past medical history of Human immunodeficiency virus (HIV) disease; Hypertension; ESRD (end stage renal disease); Dialysis patient; Clostridium difficile infection (04/04/10); Bacteriuria, asymptomatic (04/04/10); MGUS (monoclonal gammopathy of unknown significance); History of bacteremia; Renal insufficiency; Anemia; and Bacteremia.  Medications  Reviewed  Family History family history includes Alcohol abuse in an unspecified family member; Cancer in her sister; Diabetes in her cousin; and Kidney disease in her maternal uncle and mother.  Social History  reports that she has never smoked. She has never used smokeless tobacco. She reports that she does not drink alcohol or use illicit drugs.  Review of Systems Positive for - chest pain (resolved), cough, fever, chills, sweating, fatigue.  Negative for - dizziness, palpitations, shortness of breath, syncope  Vital Signs: Filed Vitals:   12/19/11 0820 12/19/11 0925 12/19/11 1000 12/19/11 1100  BP: 107/61 94/53 124/77 113/70  Pulse: 71 74 69 73  Temp: 97.8 F (36.6 C)   98.2 F (36.8 C)  TempSrc: Oral   Oral  Resp: 20 21  20 16   Height: 5\' 4"  (1.626 m)     Weight: 149 lb 0.5 oz (67.6 kg)     SpO2: 99% 100% 100% 100%   Physical Exam:  I met with patient around 9:45 am today  Vitals reviewed.  General: Resting in bed, no distress, not breathing through canula appears comfortable. HEENT: PERRL, EOMI, no scleral icterus. Heart: RRR, no rubs, systolic murmur +. Lungs: Crepitations all lung fields, more on left side.  Abdomen: Soft, nontender, nondistended, BS present. Extremities: Warm, no pedal edema. Right arm AVG with palpable thrill.  Neuro: Alert and oriented X3, cranial nerves II-XII grossly intact,  strength and sensation to light touch equal in bilateral upper and lower extremities  Lab results: CMP     Component Value Date/Time   NA 132* 12/19/2011 0253   K 4.7 12/19/2011 0253   CL 93* 12/19/2011 0253   CO2 20 12/19/2011 0253   GLUCOSE 86 12/19/2011 0253   BUN 79* 12/19/2011 0253   CREATININE 8.79* 12/19/2011 0253   CREATININE 5.23* 10/22/2011 1051   CALCIUM 8.5 12/19/2011 0253   CALCIUM 7.6* 09/14/2007 1321   PROT 8.6* 12/19/2011 0253   ALBUMIN 2.2* 12/19/2011 0253   AST 27 12/19/2011 0253   ALT 13 12/19/2011 0253   ALKPHOS 126* 12/19/2011 0253   BILITOT 0.8 12/19/2011 0253   GFRNONAA 4* 12/19/2011 0253   GFRAA 5* 12/19/2011 0253   CBC    Component Value Date/Time   WBC 10.2 12/19/2011 0253   RBC 2.91* 12/19/2011 0253  HGB 10.6* 12/19/2011 0253   HCT 30.8* 12/19/2011 0253   PLT 197 12/19/2011 0253   MCV 105.8* 12/19/2011 0253   MCH 36.4* 12/19/2011 0253   MCHC 34.4 12/19/2011 0253   RDW 15.6* 12/19/2011 0253   LYMPHSABS 1.2 12/19/2011 0253   MONOABS 1.0 12/19/2011 0253   EOSABS 0.0 12/19/2011 0253   BASOSABS 0.0 12/19/2011 0253     Lab 12/19/11 0518  TROPONINI <0.30    Lab 12/19/11 0236  PROBNP [id number]*   Imaging results:  Dg Chest Port 1 View  12/19/2011  *RADIOLOGY REPORT*  Clinical Data: Chest pain for 2 days.  Fever.  Diaphoresis.  PORTABLE CHEST - 1 VIEW   Comparison: 07/11/2011  Findings: Shallow inspiration.  Cardiac enlargement with normal pulmonary vascularity.  Left perihilar airspace infiltration suggesting pneumonia.  No blunting of costophrenic angles.  No pneumothorax.  Surgical clips in the left axilla.  IMPRESSION: Cardiac enlargement.  Airspace infiltration in the left mid lung suggesting pneumonia.   Original Report Authenticated By: Lucienne Capers, M.D.     Assessment and Plan: Pneumonia, either community acquired or health care associated given the patient is a SNF resident. Given the co-morbidities of the patient, the high fevers reported per SNF, the low blood pressure that she presented with/ is keeping (though that seems to be her baseline), and clinically what looks like a multilobar involvement, I agree with the aggressive treatment started by ED for health care associated pneumonia. However, given that we are using less frequently used antibiotics like daptomycin and aztreonam, I would prefer ID on board for antibiotic stewardship in this patient. We will continue to monitor vitals, temperature curve, CBC, BMP and await culture sensitivities. We will also check for influenza.   ESRD: The patient missed dialysis yesterday, however, clinically she does not appear overloaded. We have renal on board for hemodialysis.   Hypotension: The patient is on midodrine, we will continue. However, we will be watchful for septic shock.   HIV: We will continue her current regimen and follow ID consult if they recommend any changes.   Other chronic issues per resident note.   Thanks, Madilyn Fireman, MD 12/13/201312:50 PM

## 2011-12-19 NOTE — Progress Notes (Signed)
Patient refused to continue hemodialysis treatment. Patient with 37 minutes remaining. Patient made me aware that if she was not taken off dialysis she would remove her own dialysis needles. Patient is aware of her right to discontinue her treatment . Dr Posey Pronto, Bailey Mech RN, and myself have strongly urged her to continue her treatment. She has been reminded that she has been admitted with chest pain and excess fluid due to a missed treatment. Patient has disregarded advice given and has signed off AMA.

## 2011-12-19 NOTE — Progress Notes (Addendum)
ANTIBIOTIC CONSULT NOTE - INITIAL  Pharmacy Consult for Vancomycin/Levaquin/Aztreonam Indication: rule out pneumonia  Allergies  Allergen Reactions  . Penicillins Rash    Unknown reaction    Patient Measurements:   Weight: 58.7 kg  Vital Signs: Temp: 101.2 F (38.4 C) (12/13 0220) Temp src: Oral (12/13 0220) BP: 90/46 mmHg (12/13 0300) Pulse Rate: 85  (12/13 0300)  Labs:  Basename 12/19/11 0253  WBC 10.2  HGB 10.6*  PLT 197  LABCREA --  CREATININE --   The CrCl is unknown because both a height and weight (above a minimum accepted value) are required for this calculation. No results found for this basename: VANCOTROUGH:2,VANCOPEAK:2,VANCORANDOM:2,GENTTROUGH:2,GENTPEAK:2,GENTRANDOM:2,TOBRATROUGH:2,TOBRAPEAK:2,TOBRARND:2,AMIKACINPEAK:2,AMIKACINTROU:2,AMIKACIN:2, in the last 72 hours   Microbiology: No results found for this or any previous visit (from the past 720 hour(s)).  Medical History: Past Medical History  Diagnosis Date  . Human immunodeficiency virus (HIV) disease   . Hypertension   . ESRD (end stage renal disease)   . Dialysis patient     pt on dialysis since 2010.  [name] Clostridium difficile infection 04/04/10    07/2011 and 08/2011  . Bacteriuria, asymptomatic 04/04/10    Culture grew VRE sensitive to linesolid   . MGUS (monoclonal gammopathy of unknown significance)   . History of bacteremia     MSSA  . Renal insufficiency   . Anemia   . Bacteremia     05/2011 and 08/2011  HD TTSat  Medications:  ASA  Prezista  Epivir  Midodrine  Norvir  Zerit    Assessment: 56 yo female with possible PNA for empiric antibiotics  Goal of Therapy:  Vancomycin pre-HD level 15-25  Plan:  Vancomycin 1000 mg IV now, then 500 mg IV after each HD Change Levaquin 750 mg IV now, then 500 mg IV q48h Change Aztreonam 2 g IV now, then 500 mg IV q12h   Abbott, Bronson Curb 12/19/2011,3:32 AM  Changing vancomycin to daptomycin d/t h/o VRE bacteremia. Will give 400mg  IV  QHD.  Davonna Belling, PharmD, BCPS Pager [phone]

## 2011-12-19 NOTE — Consult Note (Signed)
[facility]  Total days of antibiotics 1        Day 1 vancomycin        Day 2 aztreonam        Day 1 levofloxacin       Reason for Consult:HIV/HCAP    Referring Physician: Dr. 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 female with a past medical history of HIV/AIDS, ESRD on HD since 2009, Cdiff in 8/13, multiple bacteremias with VRE, Staph Aureus, and GBS, and mixed ischemic and non-ischemic cardiomyopathy who presented to the ED around 1am this morning from SNF.  Patient has had a fever (up to 103 subjectively) and chest pain that is worse with coughing since Wednesday.  She also has a chronic cough that she states got much worse on Wednesday.  Cough is non-productive. Patient's roommate at SNF is "sick with a cough and doesn't cover her mouth when she coughs".  Patient also complains of diarrhea that began today.  Patient received her annual flu vaccine in October.  On admission to the ER, she was febrile at 101.2 and hypotensive at 90/46.   A chest x-ray in the ED showed airspace infiltration in the left mid-lung suggestive of pneumonia.  Patient was started  Vancomycin, Levofloxacin, and Aztreonam in the ED.  This afternoon, patient feels much better.  Patient denies chest pain and current fever, but still complains of cough and chills.     Past Medical History  Diagnosis Date  . Human immunodeficiency virus (HIV) disease   . Hypertension   . ESRD (end stage renal disease)   . Dialysis patient     pt on dialysis since 2010.  [name] Clostridium difficile infection 04/04/10    07/2011 and 08/2011  . Bacteriuria, asymptomatic 04/04/10    Culture grew VRE sensitive to linesolid   . MGUS (monoclonal gammopathy of unknown significance)   . History of bacteremia    MSSA  . Renal insufficiency   . Anemia   . Bacteremia     05/2011 and 08/2011    Allergies:  Allergies  Allergen Reactions  . Penicillins Rash    Unknown reaction    Current antibiotics: Vancomycin 12/19/11>>> Levofloxacin 12/19/11>>> Aztreonam 12/18/12>>>  MEDICATIONS:    . albuterol  2.5 mg Nebulization Q6H  . aspirin EC  81 mg Oral Daily  . aztreonam  500 mg Intravenous Q12H  . b complex vitamins  1 tablet Oral Daily  . darbepoetin (ARANESP) injection - DIALYSIS  25 mcg Intravenous Q Sat-HD  . Darunavir Ethanolate  800 mg Oral Q breakfast  . feeding supplement  237 mL Oral BID BM  . heparin  5,000 Units Subcutaneous Q8H  . ipratropium  0.5 mg Nebulization Q6H  . lamiVUDine  50 mg Oral Daily  . levofloxacin (LEVAQUIN) IV  500 mg Intravenous Q48H  . midodrine  10 mg Oral BID AC  . multivitamin  1 tablet Oral Daily  . paricalcitol  4 mcg Intravenous 3 times weekly  . ritonavir  100 mg Oral Q breakfast  . stavudine  15 mg Oral Daily  . vancomycin  500 mg Intravenous Q T,Th,Sa-HD    History  Substance Use Topics  . Smoking status: Never Smoker   . Smokeless tobacco: Never Used  . Alcohol  Use: No    Family History  Problem Relation Age of Onset  . Alcohol abuse      family h/o addiction/alcoholism  . Diabetes Cousin     first degree relatives  . Kidney disease Mother   . Kidney disease Maternal Uncle   . Cancer Sister     Review of Systems - General ROS: positive for  - chills, fever, fatigue, and diaphoresis. ENT ROS: negative Respiratory ROS: Positive for nonproductive cough, denies SOB, DOE, and Wheezing Cardiovascular ROS: Negative for chest pain, palpitations, and leg swelling Gastrointestinal ROS: Positive for diarrhea starting today, Denies pain, nausea, vomiting Genito-Urinary ROS: Negative Musculoskeletal ROS: Negative Neurological ROS: Negative Dermatological ROS: Negative   OBJECTIVE: Temp:  [97.8 F (36.6 C)-101.2 F (38.4 C)] 98.2 F  (36.8 C) (12/13 1100) Pulse Rate:  [69-94] 73  (12/13 1100) Resp:  [12-26] 16  (12/13 1100) BP: (87-124)/(42-77) 113/70 mmHg (12/13 1100) SpO2:  [92 %-100 %] 100 % (12/13 1100) Weight:  [67.6 kg (149 lb 0.5 oz)] 67.6 kg (149 lb 0.5 oz) (12/13 0820) General appearance: alert, cooperative, appears older than stated age and no distress Head: Normocephalic, without obvious abnormality, atraumatic Throat: lips, musosa, and tongue normal Resp: rhonchi bilaterally Chest wall: no tenderness Cardio: systolic murmur, regular rate and rhythm GI: soft, non-tender; bowel sounds normal; no masses,  no organomegaly Extremities: Right arm AVG  with NO surrounding edema or erythema, No cyanosis, clubbing or edema. Long toe nails Pulses: 2+ and symmetric Skin: Skin color, texture, turgor normal. No rashes or lesions. Dry skin on lower legs Neurologic: Grossly normal  LABS: Results for orders placed during the hospital encounter of 12/19/11 (from the past 48 hour(s))  PRO B NATRIURETIC PEPTIDE     Status: Abnormal   Collection Time   12/19/11  2:36 AM      Component Value Range Comment   Pro B Natriuretic peptide (BNP) 29137.0 (*) 0 - 125 pg/mL   COMPREHENSIVE METABOLIC PANEL     Status: Abnormal   Collection Time   12/19/11  2:53 AM      Component Value Range Comment   Sodium 132 (*) 135 - 145 mEq/L    Potassium 4.7  3.5 - 5.1 mEq/L    Chloride 93 (*) 96 - 112 mEq/L    CO2 20  19 - 32 mEq/L    Glucose, Bld 86  70 - 99 mg/dL    BUN 79 (*) 6 - 23 mg/dL    Creatinine, Ser 8.79 (*) 0.50 - 1.10 mg/dL    Calcium 8.5  8.4 - 10.5 mg/dL    Total Protein 8.6 (*) 6.0 - 8.3 g/dL    Albumin 2.2 (*) 3.5 - 5.2 g/dL    AST 27  0 - 37 U/L    ALT 13  0 - 35 U/L    Alkaline Phosphatase 126 (*) 39 - 117 U/L    Total Bilirubin 0.8  0.3 - 1.2 mg/dL    GFR calc non Af Amer 4 (*) >90 mL/min    GFR calc Af Amer 5 (*) >90 mL/min   CBC WITH DIFFERENTIAL     Status: Abnormal   Collection Time   12/19/11  2:53 AM        Component Value Range Comment   WBC 10.2  4.0 - 10.5 K/uL    RBC 2.91 (*) 3.87 - 5.11 MIL/uL    Hemoglobin 10.6 (*) 12.0 - 15.0 g/dL    HCT 30.8 (*) 36.0 -  46.0 %    MCV 105.8 (*) 78.0 - 100.0 fL    MCH 36.4 (*) 26.0 - 34.0 pg    MCHC 34.4  30.0 - 36.0 g/dL    RDW 15.6 (*) 11.5 - 15.5 %    Platelets 197  150 - 400 K/uL    Neutrophils Relative 79 (*) 43 - 77 %    Neutro Abs 8.0 (*) 1.7 - 7.7 K/uL    Lymphocytes Relative 11 (*) 12 - 46 %    Lymphs Abs 1.2  0.7 - 4.0 K/uL    Monocytes Relative 9  3 - 12 %    Monocytes Absolute 1.0  0.1 - 1.0 K/uL    Eosinophils Relative 0  0 - 5 %    Eosinophils Absolute 0.0  0.0 - 0.7 K/uL    Basophils Relative 0  0 - 1 %    Basophils Absolute 0.0  0.0 - 0.1 K/uL   LACTIC ACID, PLASMA     Status: Normal   Collection Time   12/19/11  3:53 AM      Component Value Range Comment   Lactic Acid, Venous 0.9  0.5 - 2.2 mmol/L   TROPONIN I     Status: Normal   Collection Time   12/19/11  5:18 AM      Component Value Range Comment   Troponin I <0.30  <0.30 ng/mL   MRSA PCR SCREENING     Status: Normal   Collection Time   12/19/11  8:29 AM      Component Value Range Comment   MRSA by PCR NEGATIVE  NEGATIVE     MICRO: Blood Culture 12/19/11: Pending C.Diff 12/19/11: Pending Stool Culture 12/19/11: Pending Ova and parasite 12/19/11: Pending   IMAGING: Dg Chest Port 1 View  12/19/2011  *RADIOLOGY REPORT*  Clinical Data: Chest pain for 2 days.  Fever.  Diaphoresis.  PORTABLE CHEST - 1 VIEW  Comparison: 07/11/2011  Findings: Shallow inspiration.  Cardiac enlargement with normal pulmonary vascularity.  Left perihilar airspace infiltration suggesting pneumonia.  No blunting of costophrenic angles.  No pneumothorax.  Surgical clips in the left axilla.  IMPRESSION: Cardiac enlargement.  Airspace infiltration in the left mid lung suggesting pneumonia.   Original Report Authenticated By: Lucienne Capers, M.D.     HISTORICAL MICRO/IMAGING 08/23/11 Cath  tip: VRE  08/22/11 C.Diff: Positive 08/21/11 Blood: VRE 07/12/11 C.Diff: Positive 05/18/11 Blood: Staph Aureus 12/23/10 Blood: Staph Aureus 09/11/10 Right arm wound: Staph Aureus 09/09/10 Blood: Staph Aureus 05/24/10 Blood: GBS 03/31/10 Urine: VRE 02/23/10 Blood: Staph Aureus   Assessment/Plan: Marsheila Teale is a 56 y.o. Female with a past medical history of HIV/AIDS, ESRD on HD since 2009, Cdiff in 8/13, multiple bacteremias with VRE, Staph Aureus, and GBS, and mixed ischemic and non-ischemic cardiomyopathy who now presents with pneumonia, presumably HCAP.  1) HCAP: Considering patient lives in a SNF, is on HD, and has a long history of bacteremia and abx use, patient's pneumonia will be treated as multi-drug resistant HCAP. Patient is on vancomycin qHD, aztreonam, and levofloxacin.  Awaiting blood cultures.  Will tailor antibiotics and course of treatment with culture results.  Continue current abx for now.

## 2011-12-19 NOTE — ED Notes (Signed)
Pt brought to ED  By EMS with chest pain from two days.Pain is non radiating.Pt has been febrile last few days.

## 2011-12-19 NOTE — Consult Note (Signed)
INFECTIOUS DISEASE ATTENDING ADDENDUM:     [facility]   Date: 12/19/2011  Patient name: [name]  Medical record number: [id number]  Date of birth: 09/26/1955    This patient has been seen and discussed with the house staff. Please see their note for complete details. I concur with their findings with the following additions/corrections:   56 year old African American female with HIV AIDS end-stage renal disease on hemodialysis who PREVIOUSLY HAD been highly noncompliant with her anti-retroviral regimen in the past.   She was admitted to the hospital in August and been found to have a  vancomycin-resistant enterococcal bacteremia that was associated with her hemodialysis catheter and only cleared once or hemodialysis catheter was removed. She underwent a transesophageal echocardiogram which showed evidence of a calcified old  Vegetation hought to be do to her prior episode of endocarditis with staph  aureus. There was no new vegetation seen on transesophageal echocardiogram.    The patient also was diagnosed on admission with Clostridium difficile colitis with recurrent infection during that stay.  VRE was treated with zyvox and then daptomycin for at least two weeks beyond first set of negative blood cultures  Vancomycin oral was cntinued for two additional weeks beyond stopping her daptomycin  She was placed in SNF and in [place] has been HIGHLY compliant with ARV with nice virological suppression and immune reconstitution   She is now admitted for HCAP  With re to the HCAP, I will dc the levaquin given her hx of CDI.   Daptomycin has been changed to vancomycin which is appropriate given that Dapto is inactivated by surfactant in the lungs  Would continue vancomycin and aztreonam  Obtain sputum cultures if possible  Would complete 7 days of empiric vancomycin and aztreonam  With re to her HIV please continue her current regimen and I will  also order a Cd4 and VL today  Dr. Linus Salmons covering this weekend for questions.      Alcide Evener 12/19/2011, 5:17 PM

## 2011-12-19 NOTE — Progress Notes (Signed)
Brief Nutrition Note:  Brief Nutrition Note:  Pt answered "unsure" to "Have you recently lost weight without trying?" generating a MST (Malnutrition Screening Tool) score of 2.   Weight hx:  Wt Readings from Last 5 Encounters:  12/19/11 149 lb 0.5 oz (67.6 kg)  10/22/11 129 lb 8 oz (58.741 kg)  09/11/11 119 lb (53.978 kg)  09/11/11 119 lb 1.6 oz (54.023 kg)  09/02/11 110 lb 14.3 oz (50.3 kg)     Weight has been trending up  Current diet is Renal, no meals yet this admission   Chart reviewed, no nutrition interventions warranted at this time, please consult as needed.   Orson Slick RD, LDN Pager [phone] After Hours pager [phone]

## 2011-12-19 NOTE — ED Notes (Signed)
Given report that the Patient does not make urine.

## 2011-12-19 NOTE — ED Provider Notes (Addendum)
History     CSN: [id number]  Arrival date & time 12/19/11  0212   First MD Initiated Contact with Patient 12/19/11 [phone]      Chief Complaint  Patient presents with  . Chest Pain    (Consider location/radiation/quality/duration/timing/severity/associated sxs/prior treatment) HPI 56 year old female presents to emergency room from her nursing home with complaint of left-sided chest pain, fever and cough. Patient has past medical history significant for end-stage renal disease on dialysis Tuesday Thursday. She missed on Thursday due to not feeling well. She has had some mild shortness of breath. She does not feel fluid overloaded. Patient also has history of HIV on therapy. she thinks she had a flu shot. She reports people at the nursing home have been coughing heavily. She denies any nausea or vomiting no diarrhea no abdominal pain. She reports feeling overall weak.  Past Medical History  Diagnosis Date  . Human immunodeficiency virus (HIV) disease   . Hypertension   . ESRD (end stage renal disease)   . Dialysis patient     pt on dialysis since 2010.  [name] Clostridium difficile infection 04/04/10    07/2011 and 08/2011  . Bacteriuria, asymptomatic 04/04/10    Culture grew VRE sensitive to linesolid   . MGUS (monoclonal gammopathy of unknown significance)   . History of bacteremia     MSSA  . Renal insufficiency   . Anemia   . Bacteremia     05/2011 and 08/2011    Past Surgical History  Procedure Date  . Av fistula placement   . Insertion of dialysis catheter 12/30/2010    Procedure: INSERTION OF DIALYSIS CATHETER;  Surgeon: Elam Dutch, MD;  Location: [place];  Service: Vascular;  Laterality: Left;  Insertion of left internal jugular dialysis catheter  . Vascular surgery   . Insertion of dialysis catheter 05/26/2011    Procedure: INSERTION OF DIALYSIS CATHETER;  Surgeon: Conrad [place], MD;  Location: [place];  Service: Vascular;  Laterality: N/A;  Insertion tunneled dialysis catheter  in Right Internal Jugular with 23 cm catheter   . Insertion of dialysis catheter 08/26/2011    Procedure: INSERTION OF DIALYSIS CATHETER;  Surgeon: Elam Dutch, MD;  Location: [facility] OR;  Service: Vascular;  Laterality: Right;  insertion of Right Femoral vein Dialysis catheter  . Tee without cardioversion 08/27/2011    Procedure: TRANSESOPHAGEAL ECHOCARDIOGRAM (TEE);  Surgeon: Pixie Casino, MD;  Location: [place];  Service: Cardiovascular;  Laterality: N/A;  . Av fistula placement 09/01/2011    Procedure: INSERTION OF ARTERIOVENOUS (AV) GORE-TEX GRAFT ARM;  Surgeon: Rosetta Posner, MD;  Location: [facility] OR;  Service: Vascular;  Laterality: Right;  Using 23mm x 50  stretch goretex graft    Family History  Problem Relation Age of Onset  . Alcohol abuse      family h/o addiction/alcoholism  . Diabetes Cousin     first degree relatives  . Kidney disease Mother   . Kidney disease Maternal Uncle   . Cancer Sister     History  Substance Use Topics  . Smoking status: Never Smoker   . Smokeless tobacco: Never Used  . Alcohol Use: No    OB History    Grav Para Term Preterm Abortions TAB SAB Ect Mult Living                  Review of Systems  See History of Present Illness; otherwise all other systems are reviewed and negative  Allergies  Penicillins  Home Medications   Current Outpatient Rx  Name  Route  Sig  Dispense  Refill  . ASPIRIN 81 MG PO TABS   Oral   Take 81 mg by mouth daily.           . B COMPLEX PO TABS   Oral   Take 1 tablet by mouth daily.           [name] NEPHRO-VITE 0.8 MG PO TABS   Oral   Take 0.8 mg by mouth daily.           [name] DARUNAVIR ETHANOLATE 800 MG PO TABS   Oral   Take 1 tablet (800 mg total) by mouth daily with breakfast.   30 tablet   0   . LAMIVUDINE 10 MG/ML PO SOLN   Oral   Take 5 mLs (50 mg total) by mouth daily.   240 mL      . MIDODRINE HCL 10 MG PO TABS   Oral   Take 10 mg by mouth 2 (two) times daily at 10 AM and 5 PM.          . OXYCODONE HCL 5 MG PO TABS   Oral   Take 5 mg by mouth every 6 (six) hours as needed. For pain         . RITONAVIR 100 MG PO TABS   Oral   Take 1 tablet (100 mg total) by mouth daily with breakfast.   30 tablet   0   . STAVUDINE 15 MG PO CAPS   Oral   Take 1 capsule (15 mg total) by mouth daily.         [name] ENSURE COMPLETE PO LIQD   Oral   Take 237 mLs by mouth 2 (two) times daily between meals.   60 Bottle   11   . NEPRO/CARB STEADY PO LIQD   Oral   Take 237 mLs by mouth as needed (missed meal during dialysis.).   30 Can   11     BP 90/46  Pulse 85  Temp 101.2 F (38.4 C) (Oral)  Resp 22  SpO2 98%  Physical Exam  Nursing note and vitals reviewed. Constitutional: She is oriented to person, place, and time. She appears distressed.       Likely ill-appearing, uncomfortable, diaphoretic, thin older than stated age  HENT:  Head: Normocephalic and atraumatic.       Dry mucous membranes  Eyes: Conjunctivae normal and EOM are normal. Pupils are equal, round, and reactive to light.  Neck: Normal range of motion. Neck supple. No JVD present. No tracheal deviation present.  Cardiovascular: Normal rate, regular rhythm, normal heart sounds and intact distal pulses.  Exam reveals no gallop and no friction rub.   No murmur heard. Pulmonary/Chest: Effort normal. No respiratory distress. She has no wheezes. She has no rales. She exhibits no tenderness.       Coarse rhonchi in left lung fields, right is clear  Abdominal: Soft. Bowel sounds are normal. She exhibits no distension and no mass. There is no tenderness. There is no rebound and no guarding.  Musculoskeletal: Normal range of motion. She exhibits no edema and no tenderness.  Lymphadenopathy:    She has no cervical adenopathy.  Neurological: She is alert and oriented to person, place, and time. She exhibits normal muscle tone. Coordination normal.  Skin: Skin is warm. No rash noted. She is diaphoretic. No  erythema. No pallor.  Psychiatric: She has a normal mood  and affect. Her behavior is normal. Judgment and thought content normal.    ED Course  Procedures (including critical care time)  CRITICAL CARE Performed by: Kalman Drape   Total critical care time: 40 min  Critical care time was exclusive of separately billable procedures and treating other patients.  Critical care was necessary to treat or prevent imminent or life-threatening deterioration.  Critical care was time spent personally by me on the following activities: development of treatment plan with patient and/or surrogate as well as nursing, discussions with consultants, evaluation of patient's response to treatment, examination of patient, obtaining history from patient or surrogate, ordering and performing treatments and interventions, ordering and review of laboratory studies, ordering and review of radiographic studies, pulse oximetry and re-evaluation of patient's condition.   Labs Reviewed  PRO B NATRIURETIC PEPTIDE - Abnormal; Notable for the following:    Pro B Natriuretic peptide (BNP) 29137.0 (*)     All other components within normal limits  COMPREHENSIVE METABOLIC PANEL - Abnormal; Notable for the following:    Sodium 132 (*)     Chloride 93 (*)     BUN 79 (*)     Creatinine, Ser 8.79 (*)     Total Protein 8.6 (*)     Albumin 2.2 (*)     Alkaline Phosphatase 126 (*)     GFR calc non Af Amer 4 (*)     GFR calc Af Amer 5 (*)     All other components within normal limits  CBC WITH DIFFERENTIAL - Abnormal; Notable for the following:    RBC 2.91 (*)     Hemoglobin 10.6 (*)     HCT 30.8 (*)     MCV 105.8 (*)     MCH 36.4 (*)     RDW 15.6 (*)     Neutrophils Relative 79 (*)     Neutro Abs 8.0 (*)     Lymphocytes Relative 11 (*)     All other components within normal limits  URINALYSIS, ROUTINE W REFLEX MICROSCOPIC  URINE CULTURE  CULTURE, BLOOD (ROUTINE X 2)  CULTURE, BLOOD (ROUTINE X 2)   Dg Chest  Port 1 View  12/19/2011  *RADIOLOGY REPORT*  Clinical Data: Chest pain for 2 days.  Fever.  Diaphoresis.  PORTABLE CHEST - 1 VIEW  Comparison: 07/11/2011  Findings: Shallow inspiration.  Cardiac enlargement with normal pulmonary vascularity.  Left perihilar airspace infiltration suggesting pneumonia.  No blunting of costophrenic angles.  No pneumothorax.  Surgical clips in the left axilla.  IMPRESSION: Cardiac enlargement.  Airspace infiltration in the left mid lung suggesting pneumonia.   Original Report Authenticated By: Lucienne Capers, M.D.      1. HCAP (healthcare-associated pneumonia)   2. HIV INFECTION   3. ESRD (end stage renal disease)   4. Physical deconditioning     Date: 12/19/2011  Rate: 92  Rhythm: normal sinus rhythm  QRS Axis: left  Intervals: normal  ST/T Wave abnormalities: nonspecific ST/T changes  Conduction Disutrbances:none  Narrative Interpretation:   Old EKG Reviewed: unchanged     MDM  56 year old female with left-sided pneumonia. She is a dialysis patient as well as living in a nursing home and is HIV positive. She is healthcare associated pneumonia. Will triple treat with antibiotics, pharmacy to help with dosing. Patient received Tylenol from nursing home to 120 still having fevers here, will give aspirin. Patient with initial low blood pressure, will give gentle fluid hydration, although I feel her blood pressure is normally  low given her body habitus        Kalman Drape, MD 12/19/11 [place], MD 12/19/11 [place], MD 12/19/11 [phone]

## 2011-12-20 LAB — RENAL FUNCTION PANEL
Albumin: 2.2 g/dL — ABNORMAL LOW (ref 3.5–5.2)
BUN: 46 mg/dL — ABNORMAL HIGH (ref 6–23)
CO2: 24 mEq/L (ref 19–32)
Calcium: 8.9 mg/dL (ref 8.4–10.5)
Chloride: 95 mEq/L — ABNORMAL LOW (ref 96–112)
Creatinine, Ser: 5.91 mg/dL — ABNORMAL HIGH (ref 0.50–1.10)
GFR calc Af Amer: 8 mL/min — ABNORMAL LOW (ref >90)
GFR calc non Af Amer: 7 mL/min — ABNORMAL LOW (ref >90)
Glucose, Bld: 89 mg/dL (ref 70–99)
Phosphorus: 4.8 mg/dL — ABNORMAL HIGH (ref 2.3–4.6)
Potassium: 4.2 mEq/L (ref 3.5–5.1)
Sodium: 133 mEq/L — ABNORMAL LOW (ref 135–145)

## 2011-12-20 LAB — LACTIC ACID, PLASMA: Lactic Acid, Venous: 1.7 mmol/L (ref 0.5–2.2)

## 2011-12-20 LAB — CBC WITH DIFFERENTIAL/PLATELET
Basophils Absolute: 0 10*3/uL (ref 0.0–0.1)
Basophils Relative: 0 % (ref 0–1)
Eosinophils Absolute: 0 10*3/uL (ref 0.0–0.7)
Eosinophils Relative: 0 % (ref 0–5)
HCT: 30.4 % — ABNORMAL LOW (ref 36.0–46.0)
Hemoglobin: 10.4 g/dL — ABNORMAL LOW (ref 12.0–15.0)
Lymphocytes Relative: 8 % — ABNORMAL LOW (ref 12–46)
Lymphs Abs: 0.8 10*3/uL (ref 0.7–4.0)
MCH: 36.2 pg — ABNORMAL HIGH (ref 26.0–34.0)
MCHC: 34.2 g/dL (ref 30.0–36.0)
MCV: 105.9 fL — ABNORMAL HIGH (ref 78.0–100.0)
Monocytes Absolute: 1.3 10*3/uL — ABNORMAL HIGH (ref 0.1–1.0)
Monocytes Relative: 14 % — ABNORMAL HIGH (ref 3–12)
Neutro Abs: 7.2 10*3/uL (ref 1.7–7.7)
Neutrophils Relative %: 78 % — ABNORMAL HIGH (ref 43–77)
Platelets: 223 10*3/uL (ref 150–400)
RBC: 2.87 MIL/uL — ABNORMAL LOW (ref 3.87–5.11)
RDW: 15.7 % — ABNORMAL HIGH (ref 11.5–15.5)
WBC: 9.3 10*3/uL (ref 4.0–10.5)

## 2011-12-20 LAB — COMPREHENSIVE METABOLIC PANEL
ALT: 10 U/L (ref 0–35)
AST: 27 U/L (ref 0–37)
Albumin: 2.2 g/dL — ABNORMAL LOW (ref 3.5–5.2)
Alkaline Phosphatase: 124 U/L — ABNORMAL HIGH (ref 39–117)
BUN: 46 mg/dL — ABNORMAL HIGH (ref 6–23)
CO2: 25 mEq/L (ref 19–32)
Calcium: 8.7 mg/dL (ref 8.4–10.5)
Chloride: 94 mEq/L — ABNORMAL LOW (ref 96–112)
Creatinine, Ser: 5.87 mg/dL — ABNORMAL HIGH (ref 0.50–1.10)
GFR calc Af Amer: 8 mL/min — ABNORMAL LOW (ref >90)
GFR calc non Af Amer: 7 mL/min — ABNORMAL LOW (ref >90)
Glucose, Bld: 93 mg/dL (ref 70–99)
Potassium: 4.2 mEq/L (ref 3.5–5.1)
Sodium: 131 mEq/L — ABNORMAL LOW (ref 135–145)
Total Bilirubin: 0.9 mg/dL (ref 0.3–1.2)
Total Protein: 8.9 g/dL — ABNORMAL HIGH (ref 6.0–8.3)

## 2011-12-20 LAB — CLOSTRIDIUM DIFFICILE BY PCR: Toxigenic C. Difficile by PCR: NEGATIVE

## 2011-12-20 MED ORDER — HEPARIN SODIUM (PORCINE) 1000 UNIT/ML DIALYSIS: 100.0000 [IU]/kg | INTRAMUSCULAR | Status: DC | PRN

## 2011-12-20 MED ORDER — HEPARIN SODIUM (PORCINE) 1000 UNIT/ML DIALYSIS: 1000.0000 [IU] | INTRAMUSCULAR | Status: DC | PRN

## 2011-12-20 MED ORDER — LIDOCAINE-PRILOCAINE 2.5-2.5 % EX CREA: 1.0000 "application " | TOPICAL_CREAM | CUTANEOUS | Status: DC | PRN

## 2011-12-20 MED ORDER — NEPRO/CARBSTEADY PO LIQD: 237.0000 mL | ORAL | Status: DC | PRN

## 2011-12-20 MED ORDER — ALTEPLASE 2 MG IJ SOLR
2.0000 mg | Freq: Once | INTRAMUSCULAR | Status: DC | PRN
  Filled 2011-12-20: qty 2

## 2011-12-20 MED ORDER — PENTAFLUOROPROP-TETRAFLUOROETH EX AERO: 1.0000 "application " | INHALATION_SPRAY | CUTANEOUS | Status: DC | PRN

## 2011-12-20 MED ORDER — SODIUM CHLORIDE 0.9 % IV BOLUS (SEPSIS): 250.0000 mL | Freq: Once | INTRAVENOUS | Status: DC

## 2011-12-20 MED ORDER — VANCOMYCIN HCL 500 MG IV SOLR
500.0000 mg | Freq: Once | INTRAVENOUS | Status: AC
  Administered 2011-12-20: 500 mg via INTRAVENOUS
  Filled 2011-12-20: qty 500

## 2011-12-20 MED ORDER — SODIUM CHLORIDE 0.9 % IV SOLN: 100.0000 mL | INTRAVENOUS | Status: DC | PRN

## 2011-12-20 MED ORDER — LIDOCAINE HCL (PF) 1 % IJ SOLN: 5.0000 mL | INTRAMUSCULAR | Status: DC | PRN

## 2011-12-20 MED ORDER — ALTEPLASE 2 MG IJ SOLR: 2.0000 mg | Freq: Once | INTRAMUSCULAR | Status: DC | PRN

## 2011-12-20 MED ORDER — PARICALCITOL 5 MCG/ML IV SOLN
INTRAVENOUS | Status: AC
  Administered 2011-12-20: 4 ug via INTRAVENOUS
  Filled 2011-12-20: qty 1

## 2011-12-20 MED ORDER — HEPARIN SODIUM (PORCINE) 1000 UNIT/ML DIALYSIS
40.0000 [IU]/kg | INTRAMUSCULAR | Status: DC | PRN
  Administered 2011-12-20: 2700 [IU] via INTRAVENOUS_CENTRAL

## 2011-12-20 MED ORDER — ALBUMIN HUMAN 25 % IV SOLN
12.5000 g | Freq: Once | INTRAVENOUS | Status: AC
  Administered 2011-12-20: 12.5 g via INTRAVENOUS
  Filled 2011-12-20: qty 50

## 2011-12-20 MED ORDER — MIDODRINE HCL 5 MG PO TABS
ORAL_TABLET | ORAL | Status: AC
  Administered 2011-12-20: 10 mg via ORAL
  Filled 2011-12-20: qty 2

## 2011-12-20 MED ORDER — ALBUMIN HUMAN 25 % IV SOLN
25.0000 g | Freq: Once | INTRAVENOUS | Status: AC
  Administered 2011-12-20: 25 g via INTRAVENOUS
  Filled 2011-12-20: qty 100

## 2011-12-20 MED ORDER — DARBEPOETIN ALFA-POLYSORBATE 25 MCG/0.42ML IJ SOLN
INTRAMUSCULAR | Status: AC
  Administered 2011-12-20: 25 ug via INTRAVENOUS
  Filled 2011-12-20: qty 0.42

## 2011-12-20 MED ORDER — SODIUM CHLORIDE 0.9 % IV BOLUS (SEPSIS)
500.0000 mL | Freq: Once | INTRAVENOUS | Status: AC
  Administered 2011-12-20: 500 mL via INTRAVENOUS

## 2011-12-20 NOTE — Plan of Care (Signed)
Problem: Phase I Progression Outcomes Goal: Voiding-avoid urinary catheter unless indicated Outcome: Not Applicable Date Met:  [id number] Pt is anuric

## 2011-12-20 NOTE — Progress Notes (Signed)
Notified Attending on-call of pt's continued hypotension (SBP 50-60s). HR now NSR 80s-90s and temp <103 rectal. Bolus order received; will continue to monitor.

## 2011-12-20 NOTE — Progress Notes (Signed)
ANTIBIOTIC CONSULT NOTE  Pharmacy Consult for Vancomycin Indication: rule out pneumonia  Allergies  Allergen Reactions  . Penicillins Rash    Unknown reaction    Patient Measurements: Height: 5\' 4"  (162.6 cm) Weight: 139 lb 15.9 oz (63.5 kg) IBW/kg (Calculated) : 54.7  Weight: 58.7 kg  Vital Signs: Temp: 98.4 F (36.9 C) (12/14 0130) Temp src: Rectal (12/14 0130) BP: 78/46 mmHg (12/14 0130) Pulse Rate: 79  (12/14 0135)  Labs:  Basename 12/19/11 0253  WBC 10.2  HGB 10.6*  PLT 197  LABCREA --  CREATININE 8.79*   Estimated Creatinine Clearance: 6.2 ml/min (by C-G formula based on Cr of 8.79). No results found for this basename: VANCOTROUGH:2,VANCOPEAK:2,VANCORANDOM:2,GENTTROUGH:2,GENTPEAK:2,GENTRANDOM:2,TOBRATROUGH:2,TOBRAPEAK:2,TOBRARND:2,AMIKACINPEAK:2,AMIKACINTROU:2,AMIKACIN:2, in the last 72 hours   Assessment: 56 yo female with possible PNA for empiric antibiotics.  Underwent extra (though slightly shorter) hemodialysis session yesterday evening (typically TTSat).    Goal of Therapy:  Vancomycin pre-HD level 15-25  Plan:  Vancomycin 500 mg IV now.  F/U plan for HD  [name], Bronson Curb 12/20/2011,2:38 AM

## 2011-12-20 NOTE — Progress Notes (Signed)
Subjective: Reviewed overnight team note.  Patient feeling significantly better this morning without complaints.  Denies SOB, CP, dizziness, nausea, vomiting.  Afebrile this morning.  Objective: Vital signs in last 24 hours: Filed Vitals:   12/20/11 0600 12/20/11 0630 12/20/11 0800 12/20/11 0830  BP: 84/47 85/51 99/57    Pulse: 81 86 88   Temp: 98.9 F (37.2 C) 99.1 F (37.3 C) 99.8 F (37.7 C)   TempSrc: Rectal Rectal Rectal   Resp: 14 18 19    Height:      Weight:      SpO2: 95% 97% 95% 92%  Tmax = 105.8 Weight change:   Intake/Output Summary (Last 24 hours) at 12/20/11 1043 Last data filed at 12/20/11 0800  Gross per 24 hour  Intake    497 ml  Output   1905 ml  Net  -1408 ml   General: resting in bed, no acute distress HEENT: PERRL, EOMI, no scleral icterus Cardiac: RRR, no rubs, murmurs or gallops Pulm: soft rales bilaterally, moving normal volumes of air Abd: soft, nontender, nondistended, BS present Ext: warm and well perfused, no pedal edema Neuro: alert and oriented X3, cranial nerves II-XII grossly intact  Lab Results: Basic Metabolic Panel:  Lab [id number] 0253  NA 132*  K 4.7  CL 93*  CO2 20  GLUCOSE 86  BUN 79*  CREATININE 8.79*  CALCIUM 8.5  MG --  PHOS --   Liver Function Tests:  Lab 12/19/11 0253  AST 27  ALT 13  ALKPHOS 126*  BILITOT 0.8  PROT 8.6*  ALBUMIN 2.2*   CBC:  Lab 12/19/11 0253  WBC 10.2  NEUTROABS 8.0*  HGB 10.6*  HCT 30.8*  MCV 105.8*  PLT 197   Cardiac Enzymes:  Lab 12/19/11 0518  CKTOTAL --  CKMB --  CKMBINDEX --  TROPONINI <0.30   BNP:  Lab 12/19/11 0236  PROBNP [id number]*     Micro Results: Recent Results (from the past 240 hour(s))  MRSA PCR SCREENING     Status: Normal   Collection Time   12/19/11  8:29 AM      Component Value Range Status Comment   MRSA by PCR NEGATIVE  NEGATIVE Final    Studies/Results: Dg Chest Port 1 View  12/19/2011  *RADIOLOGY REPORT*  Clinical Data: Chest pain for 2  days.  Fever.  Diaphoresis.  PORTABLE CHEST - 1 VIEW  Comparison: 07/11/2011  Findings: Shallow inspiration.  Cardiac enlargement with normal pulmonary vascularity.  Left perihilar airspace infiltration suggesting pneumonia.  No blunting of costophrenic angles.  No pneumothorax.  Surgical clips in the left axilla.  IMPRESSION: Cardiac enlargement.  Airspace infiltration in the left mid lung suggesting pneumonia.   Original Report Authenticated By: Lucienne Capers, M.D.    Medications: I have reviewed the patient's current medications. Scheduled Meds:   . albumin human  25 g Intravenous Once  . albuterol  2.5 mg Nebulization Q6H  . aspirin EC  81 mg Oral Daily  . aztreonam  500 mg Intravenous Q12H  . b complex vitamins  1 tablet Oral Daily  . darbepoetin (ARANESP) injection - DIALYSIS  25 mcg Intravenous Q Sat-HD  . Darunavir Ethanolate  800 mg Oral Q breakfast  . feeding supplement  237 mL Oral BID BM  . heparin  5,000 Units Subcutaneous Q8H  . ipratropium  0.5 mg Nebulization Q6H  . lamiVUDine  50 mg Oral Daily  . midodrine  10 mg Oral BID AC  . multivitamin  1 tablet Oral  Daily  . paricalcitol  4 mcg Intravenous Q T,Th,Sa-HD  . ritonavir  100 mg Oral Q breakfast  . stavudine  15 mg Oral Daily  . 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 She presented with dry cough of over one week and chest pain which is now resolved. CXR with Left mid lung infiltrate, febrile with temp of 101.34F, hypotension and WBC of 10.2 (was 4.6 in 10/22/11)   1. HCAP:  Febrile with temp of 105.4F (rectal)  overnight, now afebrile. WBC stable without leukocytosis.  BP at a nadir 54/42 overnight, but recovered after 1L bolus & albumin.  BP chronically low.  Repeat lactic acid level is not elevated today.  She was initially treated in the ED with Vancomycin, Aztreonam, and Levaquin. However, given her history of VRE, Vancomycin was discontinued and Daptomycin was started. Daptomycin does not penetrate the lungs so will resume Vancomycin per pharmacy. Negative influenza panel. Plan  -Hold course with vanc & aztreonam; if patient decompensates tonight, add dapto for possible VRE bacteremia   -ID consult-Dr. Lucianne Lei Dam/[place], appreciate recs  -Tylenol for fever  -Blood cultures x 2 were drawn prior to abx and are pending  -Atrovent/Albuterol nebs q 6 x 4 doses   2. Chest pain  -resolved possibly pleuritic due to pneumonia. Trop negative x 1. BNP elevated in the setting of history of cardiomyopathy, female sex and ESRD.   3. ESRD on HD T/Th/S  -Last HD session was Tuesday prior to admission. -outpatient HD center is at [facility], her Nephrologist is Dr. Moshe Cipro. Her pro-BNP is 29,137 but in the context of ESRD. Her AVG has a good thrill and bruit. She has history of transient low BP after HD and is on midodrine at home.  [facility], appreciate recs. HD today to re-establish routine schedule -Continue midodrine for intermittent hypotension.   4. Hypotension  -monitor BP. Patient runs hypotensive with HD  -continue Midodrine.   5. Diarrhea  -contact precautions  -pending stool studies (C.diff negative; pending O&P, stool culture)   6. HIV:  -last CD4 310 and VL 367 copies in 10/2011.  -Followed by Dr. Veryl Speak in [place] clinic. Has history of medication noncompliance.  -follow cd 4 count and viral load  -Continue norvir, zerit, and prezista, Epivir   7. F/E/N  -careful if needs fluid bolus history of systolic dysfunction  -will follow labs (renal panel)  -diet ordered    Dispo: Disposition is deferred at this time, awaiting improvement of current medical problems. Anticipated discharge in approximately 2-3 day(s).  The patient does have a current PCP (GARG, ANKIT, MD), therefore will be requiring OPC follow-up after discharge.  The patient does not have transportation limitations that hinder transportation to clinic appointments.    .Services Needed at time of discharge: Y = Yes, Blank = No PT:   OT:   RN:   Equipment:   Other:     LOS: 1 day   KAPADIA, [name] 12/20/2011, 10:43 AM

## 2011-12-20 NOTE — Progress Notes (Addendum)
S: [name] had HD today. She decided to leave AMA with 37 minutes left into her total session but she is 10lbs down since her admission (149lb-->139lb) with ~3564ml of fluid removed today during HD. After HD has BP steadily decreased.   -At 2125: She had a temp of 103.91F oral , with rectal temp of 105.29F at 2200. Her BP at that time was low at 76/43, HR 112, pulse ox 92% on 2L. She was alert and oriented x3 but appeared more confused. She was given 250cc NS bolus, Tylenol and a cooling blanket. - At 2300 her temperature had a moderate decrease to 103.61F rectal. Her BP at the time was lowered to 54/42, with HR of 97. She was given another 250cc NS bolus. Her BP initially increased to 81/43 but decreased again to 73/36.  - At 0000 her BP was persistently low at 66/35. She was given a 500cc NS bolus. Her temp at that time had lowered to 110.755F rectal with HR 84, pulse ox of 98% on 3L [place]  -At 0200 her BP was low again at 78/46, she did appear less confused, however. PCCM was called, Dr. Benjamine Mola Deterding suggested one time dose of albumin 25%, 39ml.   O: -At 0300:  Vitals: BP 81/52, HR 80, pulse oxygen 100% on 3L [place], temp 98.61F Gen: Pt lying in bed, in NAD, her pillow is soaked from previous sweating while febrile, facial puffiness no longer present. Cooling blanket in place Heart: No rubs or gallops, 2/6 systolic murmur (present on admission) Lung: bibasilar soft crackles with transient expiratory rhonchi, no increased work of breathing Neuro: Alert and oriented x3  A&P: Hypotension: Her baseline BP, based on records from previous hospitalizations, appears to be in the 90s/50s. The worsened hypotensive episodes today are likely secondary to HD today but also possibly due to hypovolemia secondary to insensible losses due to fever of 105.29F. Pt with excessive sweating, pillow case and gown soaked with sweat. Pt received total of 1L NS and albumin 25% in 47ml. Pt currently denies shortness of breath,  increased cough, chest pain, or dizziness.  -Would consider repeat CXR in AM  -Will continue monitoring pt, if asymptomatic will not give IVF bolus -Pt to undergo HD in AM -Continue midodrine   Fever. Tmax of 105.29F, she is afebrile at this time with temp of 98.61F but with significant insensible fluid losses. Likely secondary to HCAP which is currently being treated with Vancomycin and Aztreonam. C.diff infx also a possible cause of fever as pt had diarrhea today and has had C. Diff infx during two hospitalizations this year.  - C. Diff stool PCR already ordered but pt has not had another episode of diarrhea. Will follow up - Continue Vanc and aztreonam - Tylenol 650mg  q 6h PRN for fever - Ordered repeat blood cultures x2 (Per Lab: only 1 sample will be collected 2/2 lab policy of max of 3  in a 24 hour period) ------------------------------------------------------------------------------------------------------- Internal Medicine Teaching Service Attending Note Date: 12/20/2011  Patient name: [name]  Medical record number: [id number]  Date of birth: [birth year]   I saw the patient today and reviewed the plan of care with my resident team.   Subjective: She seems comfortable and offers no complaints. She does not have any fever right now.   Objective: Filed Vitals:   12/20/11 0600 12/20/11 0630 12/20/11 0800 12/20/11 0830  BP: 84/47 85/51 99/57    Pulse: 81 86 88   Temp: 98.9 F (37.2 C)  99.1 F (37.3 C) 99.8 F (37.7 C)   TempSrc: Rectal Rectal Rectal   Resp: 14 18 19    Height:      Weight:      SpO2: 95% 97% 95% 92%    General: Sitting up in bed, comfortable, talking without distress. HEENT: PERRL, EOMI, no scleral icterus. Heart: RRR, no rubs, systolic murmur or gallops. Lungs: Coarse crepitations, most prominent in left upper lobe, but heard in other lung fields bilaterally as well.   Abdomen: Soft, nontender, nondistended, BS present. Extremities: Warm, no pedal  edema. Right arm AVG.  Neuro: Alert and oriented X3, grossly non-focal.  Relevant Labs No labs done after admission day.   Recent Imaging None  Assessment and Plan The patients overnight event of hypotension is resolved by giving some boluses. It was probably secondary to HD and insensible losses due to fever. However, with high fevers and clinical pneumonia in the picture, I would be always be considering septic shock in my differential. I would ask my team to get STAT labs (BMP, LFT, CBC with DIFF, lactic acid) which were not obtained by the night team. Right now the antibiotic coverage is broad as per Dr. Lucianne Lei Dam's recommendation, however I have asked Dr. Burnard Bunting to get in touch with Dr. Linus Salmons, ID on call to reassess treatment. If the patient keeps spiking after 48 hours of antibiotic, we will do a CT scan of her chest to delineate further pathology if any. Hopefully, blood cultures are back by that time, to guide [place] if anything. We have not been able to obtain sputum cultures because the patient has dry cough. Influenza PCR is negative.    Other chronic issues per resident note.    Thanks Madilyn Fireman, MD 12/20/2011, 12:34 PM

## 2011-12-20 NOTE — Progress Notes (Signed)
Patient ID: [name], female   DOB: Sep 16, 1955, 56 y.o.   MRN: [id number]   [facility] Progress Note    Subjective:   Fever and hypotension overnight- cultures are so far negative. Signed off early from HD yesterday "not feeling good"    Objective:   BP 85/51  Pulse 86  Temp 99.1 F (37.3 C) (Rectal)  Resp 18  Ht 5\' 4"  (1.626 m)  Wt 66.4 kg (146 lb 6.2 oz)  BMI 25.13 kg/m2  SpO2 97%  Physical Exam: [id number] resting in bed [id number] RRR, normal S1 and S2  Resp:Fine rales left base otherwise CTA [id number], obese, NT, BS normal [id number] Right LE edema  Labs: BMET  Lab 12/19/11 0253  NA 132*  K 4.7  CL 93*  CO2 20  GLUCOSE 86  BUN 79*  CREATININE 8.79*  ALB --  CALCIUM 8.5  PHOS --   CBC  Lab 12/19/11 0253  WBC 10.2  NEUTROABS 8.0*  HGB 10.6*  HCT 30.8*  MCV 105.8*  PLT 197   Medications:      . albuterol  2.5 mg Nebulization Q6H  . aspirin EC  81 mg Oral Daily  . aztreonam  500 mg Intravenous Q12H  . b complex vitamins  1 tablet Oral Daily  . darbepoetin (ARANESP) injection - DIALYSIS  25 mcg Intravenous Q Sat-HD  . Darunavir Ethanolate  800 mg Oral Q breakfast  . feeding supplement  237 mL Oral BID BM  . heparin  5,000 Units Subcutaneous Q8H  . ipratropium  0.5 mg Nebulization Q6H  . lamiVUDine  50 mg Oral Daily  . midodrine  10 mg Oral BID AC  . multivitamin  1 tablet Oral Daily  . paricalcitol  4 mcg Intravenous Q T,Th,Sa-HD  . ritonavir  100 mg Oral Q breakfast  . stavudine  15 mg Oral Daily  . vancomycin  500 mg Intravenous Q T,Th,Sa-HD     Assessment/ Plan:   1.Fever/cough/chest pain - believed to be secondary to left upper lobe pneumonia per chest x-ray;On IV aztreonam and Vancomycin; C difficile test pending. Blood cultures negative to date. 2. ESRD - HD on TTS @ [place]; signed off HD early yesterday. Plan truncated HD today to get her back onto TTS schedule  3. Hypotension/volume -Chronic low BP on Midodrine  10 mg bid; current wt 66.4 kg with EDW 61 kg. Will attempt UF 2-3L today.  4. Anemia - Hgb 10.6 on outpatient Epogen 13,400 U and IV Fe (Thurs), but last T-sat 44%. Follow CBC tomorrow to determine Aranesp.  5. Metabolic bone disease - Ca 8.5 (9.9 corrected), last P 3.5 on 11/21, last iPTH 163 on 10/24; on Zemplar 4 mcg, Phoslo 1 with meals.  6. Nutrition - Alb 2.2, high protein renal diet.  7. HIV - on antiviral meds per ID, last CD4 310 and viral load 367 in 10/2011    Elmarie Shiley, MD 12/20/2011, 7:59 AM

## 2011-12-20 NOTE — Progress Notes (Signed)
Notified MD of pt's newly disoriented and lethargic state in the presence of Temp 103.1 Oral, ST in 110s and SBP 70s-80s. Tylenol and Bolus order received. Will continue to monitor.

## 2011-12-20 NOTE — Progress Notes (Signed)
Notified Attending on-call of pt's rectal temp (105.8). Cooling blanket order received. Will continue to monitor.

## 2011-12-21 ENCOUNTER — Inpatient Hospital Stay (HOSPITAL_COMMUNITY): Payer: Medicaid Other

## 2011-12-21 DIAGNOSIS — R197 Diarrhea, unspecified: Secondary | ICD-10-CM

## 2011-12-21 DIAGNOSIS — I959 Hypotension, unspecified: Secondary | ICD-10-CM

## 2011-12-21 LAB — CBC WITH DIFFERENTIAL/PLATELET
Basophils Absolute: 0 10*3/uL (ref 0.0–0.1)
Basophils Relative: 0 % (ref 0–1)
Eosinophils Absolute: 0 10*3/uL (ref 0.0–0.7)
Eosinophils Relative: 0 % (ref 0–5)
HCT: 28.4 % — ABNORMAL LOW (ref 36.0–46.0)
Hemoglobin: 9.6 g/dL — ABNORMAL LOW (ref 12.0–15.0)
Lymphocytes Relative: 9 % — ABNORMAL LOW (ref 12–46)
Lymphs Abs: 1 10*3/uL (ref 0.7–4.0)
MCH: 36 pg — ABNORMAL HIGH (ref 26.0–34.0)
MCHC: 33.8 g/dL (ref 30.0–36.0)
MCV: 106.4 fL — ABNORMAL HIGH (ref 78.0–100.0)
Monocytes Absolute: 1.5 10*3/uL — ABNORMAL HIGH (ref 0.1–1.0)
Monocytes Relative: 14 % — ABNORMAL HIGH (ref 3–12)
Neutro Abs: 8.2 10*3/uL — ABNORMAL HIGH (ref 1.7–7.7)
Neutrophils Relative %: 76 % (ref 43–77)
Platelets: 216 10*3/uL (ref 150–400)
RBC: 2.67 MIL/uL — ABNORMAL LOW (ref 3.87–5.11)
RDW: 16.1 % — ABNORMAL HIGH (ref 11.5–15.5)
WBC: 10.8 10*3/uL — ABNORMAL HIGH (ref 4.0–10.5)

## 2011-12-21 LAB — HIV-1 RNA QUANT-NO REFLEX-BLD
HIV 1 RNA Quant: 114 copies/mL — ABNORMAL HIGH (ref <20)
HIV-1 RNA Quant, Log: 2.06 {Log} — ABNORMAL HIGH (ref <1.30)

## 2011-12-21 MED ORDER — DEXTROSE 5 % IV SOLN
1.0000 g | INTRAVENOUS | Status: AC
  Administered 2011-12-22 – 2011-12-23 (×2): 1 g via INTRAVENOUS
  Filled 2011-12-21 (×2): qty 1

## 2011-12-21 MED ORDER — STAVUDINE 20 MG PO CAPS
20.0000 mg | ORAL_CAPSULE | Freq: Every day | ORAL | Status: DC
  Administered 2011-12-22 – 2011-12-24 (×3): 20 mg via ORAL
  Filled 2011-12-21 (×3): qty 1

## 2011-12-21 MED ORDER — IOHEXOL 300 MG/ML  SOLN
80.0000 mL | Freq: Once | INTRAMUSCULAR | Status: AC | PRN
  Administered 2011-12-21: 80 mL via INTRAVENOUS

## 2011-12-21 MED ORDER — DEXTROSE 5 % IV SOLN
500.0000 mg | Freq: Once | INTRAVENOUS | Status: AC
  Administered 2011-12-21: 500 mg via INTRAVENOUS
  Filled 2011-12-21: qty 0.5

## 2011-12-21 MED ORDER — SODIUM CHLORIDE 0.9 % IV SOLN
6.0000 mg/kg | INTRAVENOUS | Status: DC
  Administered 2011-12-21: 380 mg via INTRAVENOUS
  Filled 2011-12-21: qty 7.6

## 2011-12-21 NOTE — Progress Notes (Signed)
Dr. Para Skeans notified of patients b/p and temp ,pt states her blood pressure runs in the 70-80's after dialysis ,pt appears comfortable, no c/o's voiced, no s/s pain or resp distress, cooling blanket was applied at 2200 when temp elevated to 103.0 oral. Tylenol was also given. Pt has no complaints. MD states we will continue to monitor, no new orders at this time. [facility] [employer]

## 2011-12-21 NOTE — Progress Notes (Signed)
Patient ID: [name], female   DOB: Mar 07, 1955, 56 y.o.   MRN: [id number]   [facility] Progress Note    Subjective:   Reports to be feeling tired this morning- febrile/hypotensive overnight.   Objective:   BP 95/52  Pulse 80  Temp 100.3 F (37.9 C) (Rectal)  Resp 38  Ht 5\' 4"  (1.626 m)  Wt 63.3 kg (139 lb 8.8 oz)  BMI 23.95 kg/m2  SpO2 97%  Physical Exam: [id number] resting in bed- breakfast tray by bedside untouched [id number] RRR, normal S1 and S2 Resp:Coarse BS bilaterally, no rales/rhonchi [id number], flat, BS normal Ext:No LE edema  Labs: BMET  Lab 12/20/11 1404 12/20/11 1356 12/19/11 0253  NA 133* 131* 132*  K 4.2 4.2 4.7  CL 95* 94* 93*  CO2 24 25 20   GLUCOSE 89 93 86  BUN 46* 46* 79*  CREATININE 5.91* 5.87* 8.79*  ALB -- -- --  CALCIUM 8.9 8.7 8.5  PHOS 4.8* -- --   CBC  Lab 12/20/11 1356 12/19/11 0253  WBC 9.3 10.2  NEUTROABS 7.2 8.0*  HGB 10.4* 10.6*  HCT 30.4* 30.8*  MCV 105.9* 105.8*  PLT 223 197   Medications:      . aspirin EC  81 mg Oral Daily  . aztreonam  500 mg Intravenous Q12H  . b complex vitamins  1 tablet Oral Daily  . darbepoetin (ARANESP) injection - DIALYSIS  25 mcg Intravenous Q Sat-HD  . Darunavir Ethanolate  800 mg Oral Q breakfast  . feeding supplement  237 mL Oral BID BM  . heparin  5,000 Units Subcutaneous Q8H  . lamiVUDine  50 mg Oral Daily  . midodrine  10 mg Oral BID AC  . multivitamin  1 tablet Oral Daily  . paricalcitol  4 mcg Intravenous Q T,Th,Sa-HD  . ritonavir  100 mg Oral Q breakfast  . stavudine  15 mg Oral Daily  . vancomycin  500 mg Intravenous Q T,Th,Sa-HD     Assessment/ Plan:   1.Fever/cough/chest pain - believed to be secondary to left upper lobe pneumonia per chest x-ray;On IV aztreonam and Vancomycin; C difficile negative. Blood cultures negative to date. Further W/U per ID 2. ESRD - HD on TTS @ [place]. HD done yesterday to get her back onto TTS schedule  3.  Hypotension/volume -Chronic low BP on Midodrine 10 mg bid; current wt 63.3 kg with EDW 61 kg. .  4. Anemia - Hgb 10.6 on outpatient Epogen 13,400 U and IV Fe (Thurs), but last T-sat 44%. S/P aranesp yesterday 5. Metabolic bone disease - Ca 8.5 (9.9 corrected), last P 3.5 on 11/21, last iPTH 163 on 10/24; on Zemplar 4 mcg, Phoslo 1 with meals.  6. Nutrition - Alb 2.2, high protein renal diet.  7. HIV - on antiviral meds per ID, last CD4 310 and viral load 367 in 10/2011   Elmarie Shiley, MD 12/21/2011, 8:49 AM

## 2011-12-21 NOTE — Progress Notes (Signed)
ANTIBIOTIC CONSULT NOTE - FOLLOW UP  Pharmacy Consult for aztreonam, daptomycin Indication: pneumonia  Allergies  Allergen Reactions  . Penicillins Rash    Unknown reaction    Patient Measurements: Height: 5\' 4"  (162.6 cm) Weight: 139 lb 8.8 oz (63.3 kg) IBW/kg (Calculated) : 54.7   Vital Signs: Temp: 100.5 F (38.1 C) (12/15 1912) Temp src: Oral (12/15 1912) BP: 92/48 mmHg (12/15 1912) Pulse Rate: 93  (12/15 1912) Intake/Output from previous day: 12/14 0701 - 12/15 0700 In: 327 [P.O.:277; IV Piggyback:50] Out: 1957  Intake/Output from this shift:    Labs:  Basename 12/21/11 0810 12/20/11 1404 12/20/11 1356 12/19/11 0253  WBC 10.8* -- 9.3 10.2  HGB 9.6* -- 10.4* 10.6*  PLT 216 -- 223 197  LABCREA -- -- -- --  CREATININE -- 5.91* 5.87* 8.79*   Estimated Creatinine Clearance: 9.3 ml/min (by C-G formula based on Cr of 5.91). No results found for this basename: VANCOTROUGH:2,VANCOPEAK:2,VANCORANDOM:2,GENTTROUGH:2,GENTPEAK:2,GENTRANDOM:2,TOBRATROUGH:2,TOBRAPEAK:2,TOBRARND:2,AMIKACINPEAK:2,AMIKACINTROU:2,AMIKACIN:2, in the last 72 hours   Microbiology: Recent Results (from the past 720 hour(s))  CULTURE, BLOOD (ROUTINE X 2)     Status: Normal (Preliminary result)   Collection Time   12/19/11  2:37 AM      Component Value Range Status Comment   Specimen Description BLOOD HAND LEFT   Final    Special Requests BOTTLES DRAWN AEROBIC AND ANAEROBIC 10CC   Final    Culture  Setup Time 12/19/2011 14:26   Final    Culture     Final    Value: GRAM POSITIVE COCCI IN PAIRS IN CHAINS     Note: Gram Stain Report Called to,Read Back By and Verified With: ROSEMARY CLARK 12/21/11 @ 5:55PM BY RUSCA.   Report Status PENDING   Incomplete   CULTURE, BLOOD (ROUTINE X 2)     Status: Normal (Preliminary result)   Collection Time   12/19/11  2:47 AM      Component Value Range Status Comment   Specimen Description BLOOD HAND LEFT   Final    Special Requests BOTTLES DRAWN AEROBIC ONLY 8CC    Final    Culture  Setup Time 12/19/2011 14:26   Final    Culture     Final    Value:        BLOOD CULTURE RECEIVED NO GROWTH TO DATE CULTURE WILL BE HELD FOR 5 DAYS BEFORE ISSUING A FINAL NEGATIVE REPORT   Report Status PENDING   Incomplete   MRSA PCR SCREENING     Status: Normal   Collection Time   12/19/11  8:29 AM      Component Value Range Status Comment   MRSA by PCR NEGATIVE  NEGATIVE Final   CULTURE, BLOOD (ROUTINE X 2)     Status: Normal (Preliminary result)   Collection Time   12/19/11 10:14 PM      Component Value Range Status Comment   Specimen Description BLOOD LEFT HAND   Final    Special Requests BOTTLES DRAWN AEROBIC ONLY 10CC   Final    Culture  Setup Time 12/20/2011 04:38   Final    Culture     Final    Value:        BLOOD CULTURE RECEIVED NO GROWTH TO DATE CULTURE WILL BE HELD FOR 5 DAYS BEFORE ISSUING A FINAL NEGATIVE REPORT   Report Status PENDING   Incomplete   CULTURE, BLOOD (ROUTINE X 2)     Status: Normal (Preliminary result)   Collection Time   12/19/11 10:35 PM  Component Value Range Status Comment   Specimen Description BLOOD LEFT HAND   Final    Special Requests BOTTLES DRAWN AEROBIC AND ANAEROBIC 10CC   Final    Culture  Setup Time 12/20/2011 04:39   Final    Culture     Final    Value:        BLOOD CULTURE RECEIVED NO GROWTH TO DATE CULTURE WILL BE HELD FOR 5 DAYS BEFORE ISSUING A FINAL NEGATIVE REPORT   Report Status PENDING   Incomplete   CLOSTRIDIUM DIFFICILE BY PCR     Status: Normal   Collection Time   12/20/11  9:25 AM      Component Value Range Status Comment   C difficile by pcr NEGATIVE  NEGATIVE Final   STOOL CULTURE     Status: Normal (Preliminary result)   Collection Time   12/20/11  9:25 AM      Component Value Range Status Comment   Specimen Description STOOL   Final    Special Requests NONE   Final    Culture NO SUSPICIOUS COLONIES, CONTINUING TO HOLD   Final    Report Status PENDING   Incomplete     Anti-infectives      Start     Dose/Rate Route Frequency Ordered Stop   12/22/11 1000   aztreonam (AZACTAM) 1 g in dextrose 5 % 50 mL IVPB        1 g 100 mL/hr over 30 Minutes Intravenous Every 24 hours 12/21/11 0959     12/22/11 1000   stavudine (ZERIT) capsule 20 mg        20 mg Oral Daily 12/21/11 1320     12/21/11 2200   DAPTOmycin (CUBICIN) 380 mg in sodium chloride 0.9 % IVPB        6 mg/kg  63.3 kg 215.2 mL/hr over 30 Minutes Intravenous Every 48 hours 12/21/11 2110     12/21/11 1030   aztreonam (AZACTAM) 500 mg in dextrose 5 % 50 mL IVPB        500 mg 100 mL/hr over 30 Minutes Intravenous  Once 12/21/11 0959 12/21/11 1249   12/20/11 2200   levofloxacin (LEVAQUIN) IVPB 500 mg  Status:  Discontinued        500 mg 100 mL/hr over 60 Minutes Intravenous Every 48 hours 12/19/11 0331 12/19/11 1727   12/20/11 1200   vancomycin (VANCOCIN) 500 mg in sodium chloride 0.9 % 100 mL IVPB  Status:  Discontinued        500 mg 100 mL/hr over 60 Minutes Intravenous Every T-Th-Sa (Hemodialysis) 12/19/11 0340 12/19/11 0829   12/20/11 1200   vancomycin (VANCOCIN) 500 mg in sodium chloride 0.9 % 100 mL IVPB  Status:  Discontinued        500 mg 100 mL/hr over 60 Minutes Intravenous Every T-Th-Sa (Hemodialysis) 12/19/11 1244 12/21/11 2053   12/20/11 0300   vancomycin (VANCOCIN) 500 mg in sodium chloride 0.9 % 100 mL IVPB        500 mg 100 mL/hr over 60 Minutes Intravenous  Once 12/20/11 0238 12/20/11 0503   12/19/11 1800   aztreonam (AZACTAM) 500 mg in dextrose 5 % 50 mL IVPB  Status:  Discontinued        500 mg 100 mL/hr over 30 Minutes Intravenous Every 12 hours 12/19/11 0340 12/21/11 0959   12/19/11 1200   Darunavir Ethanolate (PREZISTA) tablet 800 mg        800 mg Oral Daily with breakfast 12/19/11 0829  12/19/11 1200   ritonavir (NORVIR) tablet 100 mg        100 mg Oral Daily with breakfast 12/19/11 0829     12/19/11 1200   DAPTOmycin (CUBICIN) 400 mg in sodium chloride 0.9 % IVPB  Status:  Discontinued         400 mg 216 mL/hr over 30 Minutes Intravenous Every T-Th-Sa (Hemodialysis) 12/19/11 1015 12/19/11 1158   12/19/11 1000   lamiVUDine (EPIVIR) 10 MG/ML solution 50 mg        50 mg Oral Daily 12/19/11 0829     12/19/11 1000   stavudine (ZERIT) capsule 15 mg  Status:  Discontinued        15 mg Oral Daily 12/19/11 0829 12/21/11 1320   12/19/11 0600   aztreonam (AZACTAM) 2 g in dextrose 5 % 50 mL IVPB  Status:  Discontinued        2 g 100 mL/hr over 30 Minutes Intravenous 3 times per day 12/19/11 0328 12/19/11 0339   12/19/11 0345   aztreonam (AZACTAM) 2 g in dextrose 5 % 50 mL IVPB        2 g 100 mL/hr over 30 Minutes Intravenous  Once 12/19/11 0330 12/19/11 0558   12/19/11 0345   levofloxacin (LEVAQUIN) IVPB 750 mg        750 mg 100 mL/hr over 90 Minutes Intravenous  Once 12/19/11 0331 12/19/11 0504   12/19/11 0345   vancomycin (VANCOCIN) IVPB 1000 mg/200 mL premix        1,000 mg 200 mL/hr over 60 Minutes Intravenous  Once 12/19/11 0340 12/19/11 0802   12/19/11 0330   levofloxacin (LEVAQUIN) IVPB 750 mg  Status:  Discontinued        750 mg 100 mL/hr over 90 Minutes Intravenous Every 24 hours 12/19/11 0328 12/19/11 0331          Assessment: 56 y/oF with HCAP on aztreonam (pen allergy) and vancomycin. Per MD discussion with ID, patient has a history of VRE bacteremia so vancomycin d/c'd and daptomycin to start. HD on TTSat schedule. Tmax 104.1 and WBC 10.3.  Blood culture showing GPC in pairs/chains. Patient did receive daptomycin x 1 dose on 12/13.   Vanc 12.13>>12/15 Aztreonam 12/13>> daptomycin 12/13 >>  Plan:  1. Begin daptomycin 380mg  IV q48 hours at 2200 starting today.  2. Continue aztreonam  3. Order baseline and weekly CK  Thank you,  Francesca Jewett, PharmD, BCPS 12/21/2011 9:20 PM

## 2011-12-21 NOTE — Progress Notes (Signed)
ANTIBIOTIC CONSULT NOTE - FOLLOW UP  Pharmacy Consult for aztreonam, vancomycin Indication: pneumonia  Allergies  Allergen Reactions  . Penicillins Rash    Unknown reaction    Patient Measurements: Height: 5\' 4"  (162.6 cm) Weight: 139 lb 8.8 oz (63.3 kg) IBW/kg (Calculated) : 54.7   Vital Signs: Temp: 100.3 F (37.9 C) (12/15 0700) Temp src: Rectal (12/15 0700) BP: 95/52 mmHg (12/15 0700) Pulse Rate: 80  (12/15 0700) Intake/Output from previous day: 12/14 0701 - 12/15 0700 In: 327 [P.O.:277; IV Piggyback:50] Out: 1957  Intake/Output from this shift:    Labs:  Basename 12/21/11 0810 12/20/11 1404 12/20/11 1356 12/19/11 0253  WBC 10.8* -- 9.3 10.2  HGB 9.6* -- 10.4* 10.6*  PLT 216 -- 223 197  LABCREA -- -- -- --  CREATININE -- 5.91* 5.87* 8.79*   Estimated Creatinine Clearance: 9.3 ml/min (by C-G formula based on Cr of 5.91). No results found for this basename: VANCOTROUGH:2,VANCOPEAK:2,VANCORANDOM:2,GENTTROUGH:2,GENTPEAK:2,GENTRANDOM:2,TOBRATROUGH:2,TOBRAPEAK:2,TOBRARND:2,AMIKACINPEAK:2,AMIKACINTROU:2,AMIKACIN:2, in the last 72 hours   Microbiology: Recent Results (from the past 720 hour(s))  CULTURE, BLOOD (ROUTINE X 2)     Status: Normal (Preliminary result)   Collection Time   12/19/11  2:37 AM      Component Value Range Status Comment   Specimen Description BLOOD HAND LEFT   Final    Special Requests BOTTLES DRAWN AEROBIC AND ANAEROBIC 10CC   Final    Culture  Setup Time 12/19/2011 14:26   Final    Culture     Final    Value:        BLOOD CULTURE RECEIVED NO GROWTH TO DATE CULTURE WILL BE HELD FOR 5 DAYS BEFORE ISSUING A FINAL NEGATIVE REPORT   Report Status PENDING   Incomplete   CULTURE, BLOOD (ROUTINE X 2)     Status: Normal (Preliminary result)   Collection Time   12/19/11  2:47 AM      Component Value Range Status Comment   Specimen Description BLOOD HAND LEFT   Final    Special Requests BOTTLES DRAWN AEROBIC ONLY 8CC   Final    Culture  Setup Time  12/19/2011 14:26   Final    Culture     Final    Value:        BLOOD CULTURE RECEIVED NO GROWTH TO DATE CULTURE WILL BE HELD FOR 5 DAYS BEFORE ISSUING A FINAL NEGATIVE REPORT   Report Status PENDING   Incomplete   MRSA PCR SCREENING     Status: Normal   Collection Time   12/19/11  8:29 AM      Component Value Range Status Comment   MRSA by PCR NEGATIVE  NEGATIVE Final   CLOSTRIDIUM DIFFICILE BY PCR     Status: Normal   Collection Time   12/20/11  9:25 AM      Component Value Range Status Comment   C difficile by pcr NEGATIVE  NEGATIVE Final     Anti-infectives     Start     Dose/Rate Route Frequency Ordered Stop   12/22/11 1000   aztreonam (AZACTAM) 1 g in dextrose 5 % 50 mL IVPB        1 g 100 mL/hr over 30 Minutes Intravenous Every 24 hours 12/21/11 0959     12/21/11 1030   aztreonam (AZACTAM) 500 mg in dextrose 5 % 50 mL IVPB        500 mg 100 mL/hr over 30 Minutes Intravenous  Once 12/21/11 0959     12/20/11 2200   levofloxacin (  LEVAQUIN) IVPB 500 mg  Status:  Discontinued        500 mg 100 mL/hr over 60 Minutes Intravenous Every 48 hours 12/19/11 0331 12/19/11 1727   12/20/11 1200   vancomycin (VANCOCIN) 500 mg in sodium chloride 0.9 % 100 mL IVPB  Status:  Discontinued        500 mg 100 mL/hr over 60 Minutes Intravenous Every T-Th-Sa (Hemodialysis) 12/19/11 0340 12/19/11 0829   12/20/11 1200   vancomycin (VANCOCIN) 500 mg in sodium chloride 0.9 % 100 mL IVPB        500 mg 100 mL/hr over 60 Minutes Intravenous Every T-Th-Sa (Hemodialysis) 12/19/11 1244     12/20/11 0300   vancomycin (VANCOCIN) 500 mg in sodium chloride 0.9 % 100 mL IVPB        500 mg 100 mL/hr over 60 Minutes Intravenous  Once 12/20/11 0238 12/20/11 0503   12/19/11 1800   aztreonam (AZACTAM) 500 mg in dextrose 5 % 50 mL IVPB  Status:  Discontinued        500 mg 100 mL/hr over 30 Minutes Intravenous Every 12 hours 12/19/11 0340 12/21/11 0959   12/19/11 1200   Darunavir Ethanolate (PREZISTA) tablet  800 mg        800 mg Oral Daily with breakfast 12/19/11 0829     12/19/11 1200   ritonavir (NORVIR) tablet 100 mg        100 mg Oral Daily with breakfast 12/19/11 0829     12/19/11 1200   DAPTOmycin (CUBICIN) 400 mg in sodium chloride 0.9 % IVPB  Status:  Discontinued        400 mg 216 mL/hr over 30 Minutes Intravenous Every T-Th-Sa (Hemodialysis) 12/19/11 1015 12/19/11 1158   12/19/11 1000   lamiVUDine (EPIVIR) 10 MG/ML solution 50 mg        50 mg Oral Daily 12/19/11 0829     12/19/11 1000   stavudine (ZERIT) capsule 15 mg        15 mg Oral Daily 12/19/11 0829     12/19/11 0600   aztreonam (AZACTAM) 2 g in dextrose 5 % 50 mL IVPB  Status:  Discontinued        2 g 100 mL/hr over 30 Minutes Intravenous 3 times per day 12/19/11 0328 12/19/11 0339   12/19/11 0345   aztreonam (AZACTAM) 2 g in dextrose 5 % 50 mL IVPB        2 g 100 mL/hr over 30 Minutes Intravenous  Once 12/19/11 0330 12/19/11 0558   12/19/11 0345   levofloxacin (LEVAQUIN) IVPB 750 mg        750 mg 100 mL/hr over 90 Minutes Intravenous  Once 12/19/11 0331 12/19/11 0504   12/19/11 0345   vancomycin (VANCOCIN) IVPB 1000 mg/200 mL premix        1,000 mg 200 mL/hr over 60 Minutes Intravenous  Once 12/19/11 0340 12/19/11 0802   12/19/11 0330   levofloxacin (LEVAQUIN) IVPB 750 mg  Status:  Discontinued        750 mg 100 mL/hr over 90 Minutes Intravenous Every 24 hours 12/19/11 0328 12/19/11 0331          Assessment: 56 y/oF with HCAP on aztreonam (pen allergy) and vancomycin. HD on TTSat schedule.  Temp 100.3 this am; WBC up from yesterday.  Cx currently neg to date.    Vanc 12.13>> Aztreonam 12.13>>  Goal of Therapy:  Vancomycin preHD level 15-25   Plan:  1. Continue vancomycin IV 500 mg  after dialysis on TTSat 2. Change aztreonam to 1 g q24h for renal adjustment in HD patients    Ocie Doyne, PharmD Clinical Pharmacist Pager: [phone] Phone: [phone] 12/21/2011 10:20 AM

## 2011-12-21 NOTE — Progress Notes (Addendum)
Subjective: Overnight vitals reviewed - Tmax 104.1 overnight, low grade fever 100.3 this morning; no complaints except for persistent dry cough; feels like she has decent energy; no pain anywhere, but has had loose BMs (once yesterday and once this morning).   Objective: Vital signs in last 24 hours: Filed Vitals:   12/21/11 0300 12/21/11 0400 12/21/11 0600 12/21/11 0700  BP:    95/52  Pulse:    80  Temp: 99.8 F (37.7 C) 99.1 F (37.3 C) 99.6 F (37.6 C) 100.3 F (37.9 C)  TempSrc: Rectal Rectal Rectal Rectal  Resp:      Height:      Weight:      SpO2:    97%   Weight change: -6 lb 2.8 oz (-2.8 kg)  Intake/Output Summary (Last 24 hours) at 12/21/11 0858 Last data filed at 12/20/11 2325  Gross per 24 hour  Intake    110 ml  Output   1957 ml  Net  -1847 ml   General: resting in bed HEENT: PERRL, EOMI, no scleral icterus Cardiac: RRR, no rubs, murmurs or gallops Pulm: soft b/l rales with mild crackles at b/l bases - moderate effort Abd: soft, nontender, nondistended, BS present ; no presacral sores on physical exam Ext: warm and well perfused, no pedal edema Neuro: alert and oriented X3, cranial nerves II-XII grossly intact  Lab Results: Basic Metabolic Panel:  Lab [id number] 12/20/11 1356  NA 133* 131*  K 4.2 4.2  CL 95* 94*  CO2 24 25  GLUCOSE 89 93  BUN 46* 46*  CREATININE 5.91* 5.87*  CALCIUM 8.9 8.7  MG -- --  PHOS 4.8* --   Liver Function Tests:  Lab 12/20/11 1404 12/20/11 1356 12/19/11 0253  AST -- 27 27  ALT -- 10 13  ALKPHOS -- 124* 126*  BILITOT -- 0.9 0.8  PROT -- 8.9* 8.6*  ALBUMIN 2.2* 2.2* --   CBC:  Lab 12/21/11 0810 12/20/11 1356  WBC 10.8* 9.3  NEUTROABS 8.2* 7.2  HGB 9.6* 10.4*  HCT 28.4* 30.4*  MCV 106.4* 105.9*  PLT 216 223   Cardiac Enzymes:  Lab 12/19/11 0518  CKTOTAL --  CKMB --  CKMBINDEX --  TROPONINI <0.30   BNP:  Lab 12/19/11 0236  PROBNP [id number]*    Micro Results: Recent Results (from the past 240  hour(s))  CULTURE, BLOOD (ROUTINE X 2)     Status: Normal (Preliminary result)   Collection Time   12/19/11  2:37 AM      Component Value Range Status Comment   Specimen Description BLOOD HAND LEFT   Final    Special Requests BOTTLES DRAWN AEROBIC AND ANAEROBIC 10CC   Final    Culture  Setup Time 12/19/2011 14:26   Final    Culture     Final    Value:        BLOOD CULTURE RECEIVED NO GROWTH TO DATE CULTURE WILL BE HELD FOR 5 DAYS BEFORE ISSUING A FINAL NEGATIVE REPORT   Report Status PENDING   Incomplete   CULTURE, BLOOD (ROUTINE X 2)     Status: Normal (Preliminary result)   Collection Time   12/19/11  2:47 AM      Component Value Range Status Comment   Specimen Description BLOOD HAND LEFT   Final    Special Requests BOTTLES DRAWN AEROBIC ONLY 8CC   Final    Culture  Setup Time 12/19/2011 14:26   Final    Culture  Final    Value:        BLOOD CULTURE RECEIVED NO GROWTH TO DATE CULTURE WILL BE HELD FOR 5 DAYS BEFORE ISSUING A FINAL NEGATIVE REPORT   Report Status PENDING   Incomplete   MRSA PCR SCREENING     Status: Normal   Collection Time   12/19/11  8:29 AM      Component Value Range Status Comment   MRSA by PCR NEGATIVE  NEGATIVE Final   CLOSTRIDIUM DIFFICILE BY PCR     Status: Normal   Collection Time   12/20/11  9:25 AM      Component Value Range Status Comment   C difficile by pcr NEGATIVE  NEGATIVE Final    Studies/Results: No results found. Medications: I have reviewed the patient's current medications. Scheduled Meds:   . aspirin EC  81 mg Oral Daily  . aztreonam  500 mg Intravenous Q12H  . b complex vitamins  1 tablet Oral Daily  . darbepoetin (ARANESP) injection - DIALYSIS  25 mcg Intravenous Q Sat-HD  . Darunavir Ethanolate  800 mg Oral Q breakfast  . feeding supplement  237 mL Oral BID BM  . heparin  5,000 Units Subcutaneous Q8H  . lamiVUDine  50 mg Oral Daily  . midodrine  10 mg Oral BID AC  . multivitamin  1 tablet Oral Daily  . paricalcitol  4 mcg  Intravenous Q T,Th,Sa-HD  . ritonavir  100 mg Oral Q breakfast  . stavudine  15 mg Oral Daily  . vancomycin  500 mg Intravenous Q T,Th,Sa-HD   Continuous Infusions:  PRN Meds:.acetaminophen, feeding supplement (NEPRO CARB STEADY), ondansetron (ZOFRAN) IV, ondansetron  Assessment/Plan: Ms. 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 She presented with dry cough of over one week and chest pain which is now resolved. CXR with Left mid lung infiltrate, febrile with temp of 101.107F, hypotension and WBC of 10.2 (was 4.6 in 10/22/11)   1. HCAP:  Febrile with temp of 104.62F (rectal) overnight, now low grade temp = 100.3. Today CBC reveals overt leukocytosis, and during this admission, WBC has been elevated from baseline. BP this morning improved, and she required no fluids overnight. BP chronically low.  She was initially treated in the ED with Vancomycin, Aztreonam, and Levaquin. However, given her history of VRE, Vancomycin was discontinued and Daptomycin was started. Daptomycin does not penetrate the lungs so will resume Vancomycin per pharmacy. Negative influenza panel.  Plan  -Hold course with vanc & aztreonam; if patient decompensates, add dapto for possible VRE bacteremia, but currently all cultures have no growth to date (stool, blood x3) -repeat CXR today to monitor PNA -Consider drug fever in setting of high temperatures -ID consult-Dr. Lucianne Lei Dam/[place], appreciate recs  -Tylenol for fever   -Atrovent/Albuterol nebs q 6 x 4 doses   2. Chest pain  -resolved. Was likely pleuritic & due to pneumonia. Trop negative x 1. BNP elevated in the setting of history of cardiomyopathy, female sex and ESRD.   3. ESRD on HD T/Th/S  -Last HD session was yesterday (Sat)-outpatient HD center is  at [facility], her Nephrologist is Dr. Moshe Cipro.  Her AVG has a good thrill and bruit. She has history of transient low BP after HD and  is on midodrine at home.  -Nephrology following, appreciate recs - cont TTS schedule  -Continue midodrine for intermittent hypotension.   4. Hypotension  -monitor BP. Patient runs hypotensive with HD  -continue Midodrine.   5. Diarrhea  -contact precautions  -pending stool studies (C.diff negative; pending O&P, stool culture)  -Send fobt, fecal fat & lactoferrin  6. HIV:  -last CD4 310 and VL 367 copies in 10/2011.  -Followed by Dr. Veryl Speak in [place] [facility]. Has history of medication noncompliance.  -follow cd 4 count and viral load for this admission -Continue norvir, zerit, and prezista, Epivir   7. F/E/N  -careful if needs fluid bolus history of systolic dysfunction  -will follow labs (renal panel)  -diet ordered - will hold dairy and see if diarrhea improves  Dispo: Disposition is deferred at this time, awaiting improvement of current medical problems. Anticipated discharge in approximately 2-3 day(s).  The patient does have a current PCP (GARG, ANKIT, MD), therefore will be requiring OPC follow-up after discharge.  The patient does not have transportation limitations that hinder transportation to clinic appointments.    .Services Needed at time of discharge: Y = Yes, Blank = No PT:   OT:   RN:   Equipment:   Other:     LOS: 2 days   KAPADIA, NEEMA 12/21/2011, 8:58 AM ------------------------------------------------------------------------ Internal Medicine Teaching Service Attending Note Date: 12/21/2011  Patient name: MONTSERRATH REINHOLT  Medical record number: [id number]  Date of birth: 07/07/1955   I saw the patient today and reviewed the plan of care with my resident team. I agree with the above note by Dr. Burnard Bunting.   Thanks Madilyn Fireman, MD 12/21/2011, 1:26 PM

## 2011-12-22 LAB — CBC WITH DIFFERENTIAL/PLATELET
Basophils Absolute: 0 10*3/uL (ref 0.0–0.1)
Basophils Relative: 0 % (ref 0–1)
Eosinophils Absolute: 0.5 10*3/uL (ref 0.0–0.7)
Eosinophils Relative: 5 % (ref 0–5)
HCT: 28.6 % — ABNORMAL LOW (ref 36.0–46.0)
Hemoglobin: 9.7 g/dL — ABNORMAL LOW (ref 12.0–15.0)
Lymphocytes Relative: 10 % — ABNORMAL LOW (ref 12–46)
Lymphs Abs: 0.9 10*3/uL (ref 0.7–4.0)
MCH: 36.1 pg — ABNORMAL HIGH (ref 26.0–34.0)
MCHC: 33.9 g/dL (ref 30.0–36.0)
MCV: 106.3 fL — ABNORMAL HIGH (ref 78.0–100.0)
Monocytes Absolute: 0.5 10*3/uL (ref 0.1–1.0)
Monocytes Relative: 5 % (ref 3–12)
Neutro Abs: 7.4 10*3/uL (ref 1.7–7.7)
Neutrophils Relative %: 80 % — ABNORMAL HIGH (ref 43–77)
Platelets: 264 10*3/uL (ref 150–400)
RBC: 2.69 MIL/uL — ABNORMAL LOW (ref 3.87–5.11)
RDW: 16.3 % — ABNORMAL HIGH (ref 11.5–15.5)
WBC: 9.3 10*3/uL (ref 4.0–10.5)

## 2011-12-22 LAB — HIV-1 RNA QUANT-NO REFLEX-BLD
HIV 1 RNA Quant: 169 copies/mL — ABNORMAL HIGH (ref <20)
HIV-1 RNA Quant, Log: 2.23 {Log} — ABNORMAL HIGH (ref <1.30)

## 2011-12-22 LAB — T-HELPER CELLS (CD4) COUNT (NOT AT ARMC)
CD4 % Helper T Cell: 15 % — ABNORMAL LOW (ref 33–55)
CD4 T Cell Abs: 160 uL — ABNORMAL LOW (ref 400–2700)

## 2011-12-22 LAB — FECAL FAT, QUALITATIVE
Free fatty acids: NORMAL
Neutral Fat: NORMAL

## 2011-12-22 LAB — CK: Total CK: 245 U/L — ABNORMAL HIGH (ref 7–177)

## 2011-12-22 MED ORDER — SODIUM CHLORIDE 0.9 % IV SOLN
6.0000 mg/kg | INTRAVENOUS | Status: DC
  Filled 2011-12-22: qty 7.6

## 2011-12-22 MED ORDER — VANCOMYCIN HCL 500 MG IV SOLR
500.0000 mg | INTRAVENOUS | Status: DC
  Filled 2011-12-22: qty 500

## 2011-12-22 NOTE — Evaluation (Signed)
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  Pt will benefit from acute PT services to improve overall mobilty and prepare for safe d/c to next venue.  Pt wants to return to [facility] with PT services.    PT Assessment  Patient needs continued PT services    Follow Up Recommendations  SNF    Does the patient have the potential to tolerate intense rehabilitation      Barriers to Discharge None      Equipment Recommendations  None recommended by PT    Recommendations for Other Services     Frequency Min 3X/week    Precautions / Restrictions Precautions Precautions: Fall (pt reports falls in the restroom at [facility]) Precaution Comments: Contact Restrictions Weight Bearing Restrictions: No   Pertinent Vitals/Pain No c/o pain      Mobility  Bed Mobility Bed Mobility: Supine to Sit;Sitting - Scoot to Edge of Bed Supine to Sit: 4: Min guard;HOB elevated;With rails Sitting - Scoot to Edge of Bed: 4: Min guard Details for Bed Mobility Assistance: Minguard for safety with cues for technique Transfers Transfers: Sit to Stand;Stand to Sit Sit to Stand: 4: Min assist;From bed Stand to Sit: 4: Min assist;To chair/3-in-1;To toilet Details for Transfer Assistance: (A) to initiate transfer with cues for hand placement.   Ambulation/Gait Ambulation/Gait Assistance: 4: Min assist Ambulation Distance (Feet): 60 Feet Assistive device: Rolling walker Ambulation/Gait Assistance Details: (A) to maintain balance with cues for body position within RW and correct RW placement with ambulation. Gait Pattern: Step-through pattern;Decreased stride  length;Shuffle;Trunk flexed Stairs: No    Shoulder Instructions     Exercises     PT Diagnosis: Difficulty walking;Generalized weakness  PT Problem List: Decreased strength;Decreased activity tolerance;Decreased balance;Decreased mobility;Decreased knowledge of use of DME PT Treatment Interventions: DME instruction;Gait training;Functional mobility training;Therapeutic activities;Therapeutic exercise;Balance training;Patient/family education   PT Goals Acute Rehab PT Goals PT Goal Formulation: With patient Time For Goal Achievement: 12/29/11 Potential to Achieve Goals: Good Pt will go Supine/Side to Sit: with modified independence PT Goal: Supine/Side to Sit - Progress: Goal set today Pt will go Sit to Supine/Side: with modified independence PT Goal: Sit to Supine/Side - Progress: Goal set today Pt will go Sit to Stand: with modified independence PT Goal: Sit to Stand - Progress: Goal set today Pt will go Stand to Sit: with modified independence PT Goal: Stand to Sit - Progress: Goal set today Pt will Ambulate: >150 feet;with modified independence;with rolling walker PT Goal: Ambulate - Progress: Goal set today  Visit Information  Last PT Received On: 12/22/11 Assistance Needed: +1    Subjective Data  Subjective: "I need to use the restrrom." Patient Stated Goal: To return to [facility].   Prior Functioning  Home Living Type of Home: [place] (At [facility]) Additional Comments: Pt plans to return to [facility] at d/c Prior Function Level of Independence: Needs assistance (Pt using RW to ambulate with PT, W/C to get around w/o PT) Needs Assistance: Bathing;Dressing;Toileting;Meal Prep;Light Housekeeping Bath: Minimal Dressing: Minimal Toileting: Minimal Meal Prep: Unable to assess Light Housekeeping: Unable to assess Communication Communication: No difficulties    Cognition  Overall Cognitive  Status: Appears within functional limits for tasks  assessed/performed Arousal/Alertness: Awake/alert Orientation Level: Appears intact for tasks assessed Behavior During Session: [facility] for tasks performed    Extremity/Trunk Assessment Right Lower Extremity Assessment RLE ROM/Strength/Tone: Within functional levels Left Lower Extremity Assessment LLE ROM/Strength/Tone: Within functional levels   Balance    End of Session PT - End of Session Equipment Utilized During Treatment: Gait belt Activity Tolerance: Patient tolerated treatment well Patient left: in chair;with call bell/phone within reach Nurse Communication: Mobility status  GP     Jodee Wagenaar 12/22/2011, 10:11 AM Antoine Poche, PT DPT [phone]

## 2011-12-22 NOTE — Care Management Note (Signed)
    Page 1 of 1   12/22/2011     12:01:39 PM   CARE MANAGEMENT NOTE 12/22/2011  Patient:  EMYLE, HAGGE A   Account Number:  [id number]  Date Initiated:  12/22/2011  Documentation initiated by:  Marvetta Gibbons  Subjective/Objective Assessment:   Pt admitted with c/p--PNA     Action/Plan:   PTA pt lived at [place] to follow for d/c needs- return to SNF   Anticipated DC Date:  12/24/2011   Anticipated DC Plan:  SKILLED NURSING FACILITY  In-house referral  Clinical Social Worker      DC Planning Services  CM consult      Choice offered to / List presented to:             Status of service:  In process, will continue to follow Medicare Important Message given?   (If response is "NO", the following Medicare IM given date fields will be blank) Date Medicare IM given:   Date Additional Medicare IM given:    Discharge Disposition:    Per UR Regulation:  Reviewed for med. necessity/level of care/duration of stay  If discussed at [place] of Stay Meetings, dates discussed:    Comments:

## 2011-12-22 NOTE — Assessment & Plan Note (Signed)
Encouraged compliance with her medications.  She follows with Dr. Tommy Medal.

## 2011-12-22 NOTE — Assessment & Plan Note (Signed)
BP Readings from Last 6 Encounters:  09/11/11 124/81  09/11/11 138/84  09/02/11 95/56  09/02/11 95/56  09/02/11 95/56  09/02/11 95/56   BP today is well controlled.  She has a history of hypotension following dialysis but is currently stable with no orthostatic changes.

## 2011-12-22 NOTE — Progress Notes (Signed)
Utilization review completed.  

## 2011-12-22 NOTE — Progress Notes (Signed)
MD notified about pt BP of 88/45 and temp of 100.8. No new orders given, will continue to monitor.  [place], [place], Therapist, sports

## 2011-12-22 NOTE — Progress Notes (Signed)
Pt received from unit. Pt remains stable. No complaints of pain or signs of distress. Pt placed on tele. Bed low and Call bell within reach. [place], Franky Macho

## 2011-12-22 NOTE — Assessment & Plan Note (Signed)
She has a long standing pancytopenia that is likely secondary to her poorly controlled HIV.  She is currently at her baseline.  I encouraged her to continue taking her medications and that I would expect some recovery with continued suppression of her HIV>

## 2011-12-22 NOTE — Assessment & Plan Note (Signed)
She is a T/Th/Sat.  She follows with Dr. Moshe Cipro of [facility].  She is tolerating her dialysis well.

## 2011-12-22 NOTE — Progress Notes (Signed)
Patient ID: [name], female   DOB: September 05, 1955, 56 y.o.   MRN: [id number]   [facility] Progress Note    Subjective:   Wants to get OOB, no complaints   Objective:   BP 93/55  Pulse 77  Temp 97.8 F (36.6 C) (Oral)  Resp 18  Ht 5\' 4"  (1.626 m)  Wt 65.5 kg (144 lb 6.4 oz)  BMI 24.79 kg/m2  SpO2 100%  Labs: BMET  Lab 12/20/11 1404 12/20/11 1356 12/19/11 0253  NA 133* 131* 132*  K 4.2 4.2 4.7  CL 95* 94* 93*  CO2 24 25 20   GLUCOSE 89 93 86  BUN 46* 46* 79*  CREATININE 5.91* 5.87* 8.79*  ALB -- -- --  CALCIUM 8.9 8.7 8.5  PHOS 4.8* -- --   CBC  Lab 12/22/11 0941 12/21/11 0810 12/20/11 1356 12/19/11 0253  WBC 9.3 10.8* 9.3 10.2  NEUTROABS 7.4 8.2* 7.2 8.0*  HGB 9.7* 9.6* 10.4* 10.6*  HCT 28.6* 28.4* 30.4* 30.8*  MCV 106.3* 106.4* 105.9* 105.8*  PLT 264 216 223 197   Medications:       . aspirin EC  81 mg Oral Daily  . aztreonam  1 g Intravenous Q24H  . b complex vitamins  1 tablet Oral Daily  . DAPTOmycin (CUBICIN)  IV  6 mg/kg Intravenous Q T,Th,Sat-1800  . darbepoetin (ARANESP) injection - DIALYSIS  25 mcg Intravenous Q Sat-HD  . Darunavir Ethanolate  800 mg Oral Q breakfast  . feeding supplement  237 mL Oral BID BM  . heparin  5,000 Units Subcutaneous Q8H  . lamiVUDine  50 mg Oral Daily  . midodrine  10 mg Oral BID AC  . multivitamin  1 tablet Oral Daily  . paricalcitol  4 mcg Intravenous Q T,Th,Sa-HD  . ritonavir  100 mg Oral Q breakfast  . stavudine  20 mg Oral Daily  . vancomycin  500 mg Intravenous Q T,Th,Sa-HD   Physical Exam: [id number] resting in bed [id number] RRR, normal S1 and S2 Resp:Coarse BS bilaterally, no rales/rhonchi [id number], flat, BS normal Ext:No LE edema  Dialysis Orders: Center: [facility] on TTS.  EDW 61 kg HD Bath 2K/2.25Ca Time 4 hrs Heparin 5900 U. Access AVG @ RUA BFR 400 DFR 800  Zemplar 4 mcg IV/HD Epogen 13,400 Units IV/HD Venofer 50 mg on Thurs.    Assessment/ Plan:   1.Fever/cough/chest pain -  believed to be secondary to left upper lobe pneumonia per chest x-ray;On IV aztreonam and daptomycin; C difficile negative. Blood cultures 1/4 + for GPC in chains and pairs.  Awaiting ID. 2. ESRD - HD on [facility], full heparin. HD done Sat to get her back onto TTS schedule  3. Hypotension/volume -Chronic low BP on Midodrine 10 mg bid; currently 4.5 kg up with EDW 61 kg. 4. Anemia - Hgb 10.6 on outpatient Epogen 13,400 U and IV Fe (Thurs), but last T-sat 44%. On darbe SQ 25/wk q Sat in place of EPO. 5. Metabolic bone disease - Ca 8.5 (9.9 corrected), last P 3.5 on 11/21, last iPTH 163 on 10/24; on Zemplar 4 mcg, Phoslo 1 with meals.  6. Nutrition - Alb 2.2, high protein renal diet.  7. HIV - on antiviral meds per ID, last CD4 310 and viral load 367 in 10/2011   Kelly Splinter  MD [place] [phone] pgr    [phone] cell 12/22/2011, 7:03 PM

## 2011-12-22 NOTE — Assessment & Plan Note (Signed)
She has completed her 2 weeks of Daptomycin and she is doing well.  This was associated with her dialysis catheter that has since been removed.  She has a graft that is not quite ready to be used and then we will be able to remove the femoral cath that she is using in the mean time.

## 2011-12-22 NOTE — Assessment & Plan Note (Signed)
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  Her diarrhea has resolved and she is feeling better.  I encouraged her to use the nutritional supplements to gain back some of her weight.

## 2011-12-22 NOTE — Progress Notes (Addendum)
Subjective: Patient reports 2 episodes of diarrhea w/in 24 hours, still with dry cough, denies chest pain, denies sob.  She wants to get OOB.    Objective: Vital signs in last 24 hours: Filed Vitals:   12/21/11 1912 12/21/11 2317 12/22/11 0327 12/22/11 0700  BP: 92/48 88/45 82/40  100/52  Pulse: 93 92 72 71  Temp: 100.5 F (38.1 C) 100.8 F (38.2 C) 98.7 F (37.1 C) 99.6 F (37.6 C)  TempSrc: Oral Oral Oral Oral  Resp: 23 22 18 17   Height:      Weight:   144 lb 6.4 oz (65.5 kg)   SpO2: 100% 100% 99% 99%   Weight change: 1 lb 8.7 oz (0.7 kg)  Intake/Output Summary (Last 24 hours) at 12/22/11 0816 Last data filed at 12/22/11 [id number]  Gross per 24 hour  Intake 1047.6 ml  Output      2 ml  Net 1045.6 ml   General: lying in bed, nad, alert and oriented x 3 HEENT: [place]/at CV: RRR no rubs, murmurs, or gallops Lungs: mild crackles b/l Abdomen: soft, ntnd, normal bs Extremities: no cyanosis or edema, warm, left lower extremity with increased size compared with right lower extremity.  Also LLE with hyperpigmentation Skin: no evidence of breakdown, intact Neuro: CN 2-12 grossly intact, moving all 4 extremities  Lab Results: Basic Metabolic Panel:  Lab [id number] 12/20/11 1356  NA 133* 131*  K 4.2 4.2  CL 95* 94*  CO2 24 25  GLUCOSE 89 93  BUN 46* 46*  CREATININE 5.91* 5.87*  CALCIUM 8.9 8.7  MG -- --  PHOS 4.8* --   Liver Function Tests:  Lab 12/20/11 1404 12/20/11 1356 12/19/11 0253  AST -- 27 27  ALT -- 10 13  ALKPHOS -- 124* 126*  BILITOT -- 0.9 0.8  PROT -- 8.9* 8.6*  ALBUMIN 2.2* 2.2* --   CBC:  Lab 12/21/11 0810 12/20/11 1356  WBC 10.8* 9.3  NEUTROABS 8.2* 7.2  HGB 9.6* 10.4*  HCT 28.4* 30.4*  MCV 106.4* 105.9*  PLT 216 223   Cardiac Enzymes:  Lab 12/22/11 0535 12/19/11 0518  CKTOTAL 245* --  CKMB -- --  CKMBINDEX -- --  TROPONINI -- <0.30   BNP:  Lab 12/19/11 0236  PROBNP [id number]*   Misc. Labs:none  Micro Results: Recent Results (from  the past 240 hour(s))  CULTURE, BLOOD (ROUTINE X 2)     Status: Normal (Preliminary result)   Collection Time   12/19/11  2:37 AM      Component Value Range Status Comment   Specimen Description BLOOD HAND LEFT   Final    Special Requests BOTTLES DRAWN AEROBIC AND ANAEROBIC 10CC   Final    Culture  Setup Time 12/19/2011 14:26   Final    Culture     Final    Value: GRAM POSITIVE COCCI IN PAIRS IN CHAINS     Note: Gram Stain Report Called to,Read Back By and Verified With: ROSEMARY CLARK 12/21/11 @ 5:55PM BY RUSCA.   Report Status PENDING   Incomplete   CULTURE, BLOOD (ROUTINE X 2)     Status: Normal (Preliminary result)   Collection Time   12/19/11  2:47 AM      Component Value Range Status Comment   Specimen Description BLOOD HAND LEFT   Final    Special Requests BOTTLES DRAWN AEROBIC ONLY [facility]   Final    Culture  Setup Time 12/19/2011 14:26   Final    Culture  Final    Value:        BLOOD CULTURE RECEIVED NO GROWTH TO DATE CULTURE WILL BE HELD FOR 5 DAYS BEFORE ISSUING A FINAL NEGATIVE REPORT   Report Status PENDING   Incomplete   MRSA PCR SCREENING     Status: Normal   Collection Time   12/19/11  8:29 AM      Component Value Range Status Comment   MRSA by PCR NEGATIVE  NEGATIVE Final   CULTURE, BLOOD (ROUTINE X 2)     Status: Normal (Preliminary result)   Collection Time   12/19/11 10:14 PM      Component Value Range Status Comment   Specimen Description BLOOD LEFT HAND   Final    Special Requests BOTTLES DRAWN AEROBIC ONLY 10CC   Final    Culture  Setup Time 12/20/2011 04:38   Final    Culture     Final    Value:        BLOOD CULTURE RECEIVED NO GROWTH TO DATE CULTURE WILL BE HELD FOR 5 DAYS BEFORE ISSUING A FINAL NEGATIVE REPORT   Report Status PENDING   Incomplete   CULTURE, BLOOD (ROUTINE X 2)     Status: Normal (Preliminary result)   Collection Time   12/19/11 10:35 PM      Component Value Range Status Comment   Specimen Description BLOOD LEFT HAND   Final     Special Requests BOTTLES DRAWN AEROBIC AND ANAEROBIC 10CC   Final    Culture  Setup Time 12/20/2011 04:39   Final    Culture     Final    Value:        BLOOD CULTURE RECEIVED NO GROWTH TO DATE CULTURE WILL BE HELD FOR 5 DAYS BEFORE ISSUING A FINAL NEGATIVE REPORT   Report Status PENDING   Incomplete   CLOSTRIDIUM DIFFICILE BY PCR     Status: Normal   Collection Time   12/20/11  9:25 AM      Component Value Range Status Comment   C difficile by pcr NEGATIVE  NEGATIVE Final   STOOL CULTURE     Status: Normal (Preliminary result)   Collection Time   12/20/11  9:25 AM      Component Value Range Status Comment   Specimen Description STOOL   Final    Special Requests NONE   Final    Culture NO SUSPICIOUS COLONIES, CONTINUING TO HOLD   Final    Report Status PENDING   Incomplete    Studies/Results: Dg Chest 2 View  12/21/2011  *RADIOLOGY REPORT*  Clinical Data: Pneumonia.  CHEST - 2 VIEW  Comparison: 12/19/2011  Findings: Worsening pulmonary consolidation is seen in the left upper lobe.  Right lung remains clear.  No evidence of pleural effusion.  Heart size remains stable.  IMPRESSION: Worsening left upper lobe consolidation.   Original Report Authenticated By: Earle Gell, M.D.    Ct Chest W Contrast  12/21/2011  *RADIOLOGY REPORT*  Clinical Data: Worsening pneumonia.  HIV.  The pancytopenia. Congestive cardiomyopathy.  CT CHEST WITH CONTRAST  Technique:  Multidetector CT imaging of the chest was performed following the standard protocol during bolus administration of intravenous contrast.  Contrast: 7mL OMNIPAQUE IOHEXOL 300 MG/ML  SOLN  Comparison: None.  Findings: Confluent left upper lobe infiltrate is seen containing a central air bronchograms, consistent with pneumonia.  No evidence of central endobronchial obstruction.  No discrete pulmonary nodules or masses are identified.  The mild mediastinal and bilateral hilar  lymphadenopathy is seen which is nonspecific.  There is also mild  bilateral axillary lymphadenopathy.  The tiny right-sided pleural effusion is noted, however right lung is otherwise clear.  No evidence of left-sided pleural effusion.  IMPRESSION:  1.  Confluent left upper lobe infiltrate with air bronchograms, consistent with pneumonia. 2.  Mild mediastinal, bilateral hilar, and bilateral axillary lymphadenopathy, which is nonspecific.  Differential diagnosis includes infectious, inflammatory, and neoplastic etiologies. 3.  Tiny right pleural effusion.   Original Report Authenticated By: Earle Gell, M.D.    Medications:  Scheduled Meds:    . aspirin EC  81 mg Oral Daily  . aztreonam  1 g Intravenous Q24H  . b complex vitamins  1 tablet Oral Daily  . DAPTOmycin (CUBICIN)  IV  6 mg/kg Intravenous Q48H  . darbepoetin (ARANESP) injection - DIALYSIS  25 mcg Intravenous Q Sat-HD  . Darunavir Ethanolate  800 mg Oral Q breakfast  . feeding supplement  237 mL Oral BID BM  . heparin  5,000 Units Subcutaneous Q8H  . lamiVUDine  50 mg Oral Daily  . midodrine  10 mg Oral BID AC  . multivitamin  1 tablet Oral Daily  . paricalcitol  4 mcg Intravenous Q T,Th,Sa-HD  . ritonavir  100 mg Oral Q breakfast  . stavudine  20 mg Oral Daily   Continuous Infusions:  PRN Meds:.acetaminophen, feeding supplement (NEPRO CARB STEADY), ondansetron (ZOFRAN) IV, ondansetron  Assessment/Plan: Ms. 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 She presented with dry cough of over one week and chest pain which is now resolved. CXR with Left mid lung infiltrate, febrile with temp of 101.56F, hypotension and WBC of 10.2 (was 4.6 in 10/22/11).   1. HCAP:  -Febrile with Tmax 101.3 w/in 24 hours. Pending cbc -CT chest with confluent LUL infiltrate with air bronchograms pneumonia.   Mild mediastinal b/l hilar, b/l axillary lymphadenopathy nonspecific.  Infectious, inflammatory, neoplastic possible etiologies.  Tiny right pleural effusion -Continue Aztreonam. Vancomycin was d/c'ed  -Negative influenza panel -ID following, will follow recs and blood cultures  2. Possible bacteremia -With history of VRE, MSSA will use contact precautions -1/2 blood cultures with gram positive cocci in pairs in chains  -will f/u blood cultures  -Continue Daptomycin, following cpk which was 245  3. ESRD on HD T/Th/S  -renal following  -Continue midodrine 10 mg qd for intermittent hypotension.   4. Hypotension  -BP 80s-100s/40s-50s overnight -monitor BP. Patient runs hypotensive with HD  -continue Midodrine 10 mg qd   5. Diarrhea  -contact precautions. Etiology possibly related to antibiotic use this admission.  -pending stool studies (pending O&P, stool culture NTD and pending); C.diff negative -pending fobt to be collected; fecal fat & lactoferrin pending  6. HIV:  -last CD4 310 and VL 367 copies in 10/2011.  -This admission viral quant 114, pending cd4 -Followed by Dr. Veryl Speak in [place] [facility]. Has history of medication noncompliance.  -Continue norvir, zerit, and prezista, Epivir   7. F/E/N  -careful if needs fluid bolus history of systolic dysfunction  -will follow labs (renal panel)  -diet ordered - will hold dairy and see if diarrhea improves   Dispo: Disposition is deferred at this time, awaiting improvement of current medical problems. Anticipated discharge in approximately  2-3 day(s).  The patient does have a current PCP (GARG, ANKIT, MD), therefore will be requiring OPC follow-up after discharge.  The patient does not have transportation limitations that hinder transportation to clinic appointments.    .Services Needed at time of discharge: Y = Yes, Blank = No PT:   OT:   RN:   Equipment:   Other:     LOS: 3 days   Cresenciano Genre [id number] 12/22/2011,  8:16 AM   __________________________________________________________________ Internal Medicine Teaching Service Attending Note Date: 12/22/2011  Patient name: [name]  Medical record number: [id number]  Date of birth: 1956/01/02   I saw the patient today and reviewed the plan of care with my resident team.   Subjective: The patient does not offer any complaints. She had low grade fevers yesterday. She denies chills.  Objective: Filed Vitals:   12/21/11 1912 12/21/11 2317 12/22/11 0327 12/22/11 0700  BP: 92/48 88/45 82/40  100/52  Pulse: 93 92 72 71  Temp: 100.5 F (38.1 C) 100.8 F (38.2 C) 98.7 F (37.1 C) 99.6 F (37.6 C)  TempSrc: Oral Oral Oral Oral  Resp: 23 22 18 17   Height:      Weight:   144 lb 6.4 oz (65.5 kg)   SpO2: 100% 100% 99% 99%    General: Resting in bed. HEENT: PERRL, EOMI, no scleral icterus. Heart: RRR, no rubs, murmurs or gallops. Lungs: Rales left lung fields. Abdomen: Soft, nontender, nondistended, BS present. Extremities: Warm, no pedal edema. Neuro: Alert and oriented X3, grossly non-focal.  Relevant Labs  Lab 12/20/11 1404 12/20/11 1356 12/19/11 0253  NA 133* 131* 132*  K 4.2 4.2 --  CL 95* 94* 93*  CO2 24 25 20   GLUCOSE 89 93 86  BUN 46* 46* 79*  CREATININE 5.91* 5.87* 8.79*  CALCIUM 8.9 8.7 8.5  MG -- -- --  PHOS 4.8* -- --    Lab 12/22/11 0941 12/21/11 0810 12/20/11 1356  HGB 9.7* 9.6* 10.4*  HCT 28.6* 28.4* 30.4*  WBC 9.3 10.8* 9.3  PLT 264 216 223   Microbiology: Growing GRAM POSITIVE COCCI IN PAIRS IN CHAINS (Collection time 12.13.13 2:37 pm)    Recent Imaging Dg Chest 2 View  12/21/2011  *RADIOLOGY REPORT*  Clinical Data: Pneumonia.  CHEST - 2 VIEW  Comparison: 12/19/2011  Findings: Worsening pulmonary consolidation is seen in the left upper lobe.  Right lung remains clear.  No evidence of pleural effusion.  Heart size remains stable.  IMPRESSION: Worsening left upper lobe consolidation.   Original Report  Authenticated By: Earle Gell, M.D.    Ct Chest W Contrast  12/21/2011  *RADIOLOGY REPORT*  Clinical Data: Worsening pneumonia.  HIV.  The pancytopenia. Congestive cardiomyopathy.  CT CHEST WITH CONTRAST  Technique:  Multidetector CT imaging of the chest was performed following the standard protocol during bolus administration of intravenous contrast.  Contrast: 13mL OMNIPAQUE IOHEXOL 300 MG/ML  SOLN  Comparison: None.  Findings: Confluent left upper lobe infiltrate is seen containing a central air bronchograms, consistent with pneumonia.  No evidence of central endobronchial obstruction.  No discrete pulmonary nodules or masses are identified.  The mild mediastinal and bilateral hilar lymphadenopathy is seen which is nonspecific.  There is also mild bilateral axillary lymphadenopathy.  The tiny right-sided pleural effusion is noted, however right lung is otherwise clear.  No evidence of left-sided pleural effusion.  IMPRESSION:  1.  Confluent left upper lobe infiltrate with air bronchograms, consistent with pneumonia. 2.  Mild mediastinal,  bilateral hilar, and bilateral axillary lymphadenopathy, which is nonspecific.  Differential diagnosis includes infectious, inflammatory, and neoplastic etiologies. 3.  Tiny right pleural effusion.   Original Report Authenticated By: Earle Gell, M.D.    Assessment and Plan HCAP: Daptomycin was restarted last night fearing VRE bacteremia and per pharmacy Vancomycin was discontinued. However, I had a discussion with Dr. Tommy Medal who think that the patent should be on both Vancomycin (to cover lungs) and Daptomycin (to cover blood) along with aztreonam. We are thinking the the culture growth is likely contaminiant but we will cover for now, since the patient had been spiking fevers despite being on antibiotics. I have reviewed  Dr. Derek Mound note and I appreciate the recommendations.   Other chronic issues per resident note.    Thanks Madilyn Fireman, MD 12/22/2011,  10:59 AM

## 2011-12-22 NOTE — Progress Notes (Signed)
Pt txing to 6700 report called to [employer], VSS family at bedside

## 2011-12-22 NOTE — Progress Notes (Signed)
[place] for Infectious Disease  Day # 6 antibiotics Day # 6 aztreonam Day # 2 daptomycin Received Vancomycin from 12/12 thru 12/14  Subjective: No new complaints   Antibiotics:  Anti-infectives     Start     Dose/Rate Route Frequency Ordered Stop   12/23/11 1800   DAPTOmycin (CUBICIN) 380 mg in sodium chloride 0.9 % IVPB        6 mg/kg  63.3 kg 215.2 mL/hr over 30 Minutes Intravenous Every T-Th-Sa (1800) 12/22/11 1005     12/22/11 1000   aztreonam (AZACTAM) 1 g in dextrose 5 % 50 mL IVPB        1 g 100 mL/hr over 30 Minutes Intravenous Every 24 hours 12/21/11 0959     12/22/11 1000   stavudine (ZERIT) capsule 20 mg        20 mg Oral Daily 12/21/11 1320     12/21/11 2200   DAPTOmycin (CUBICIN) 380 mg in sodium chloride 0.9 % IVPB  Status:  Discontinued        6 mg/kg  63.3 kg 215.2 mL/hr over 30 Minutes Intravenous Every 48 hours 12/21/11 2110 12/22/11 1005   12/21/11 1030   aztreonam (AZACTAM) 500 mg in dextrose 5 % 50 mL IVPB        500 mg 100 mL/hr over 30 Minutes Intravenous  Once 12/21/11 0959 12/21/11 1249   12/20/11 2200   levofloxacin (LEVAQUIN) IVPB 500 mg  Status:  Discontinued        500 mg 100 mL/hr over 60 Minutes Intravenous Every 48 hours 12/19/11 0331 12/19/11 1727   12/20/11 1200   vancomycin (VANCOCIN) 500 mg in sodium chloride 0.9 % 100 mL IVPB  Status:  Discontinued        500 mg 100 mL/hr over 60 Minutes Intravenous Every T-Th-Sa (Hemodialysis) 12/19/11 0340 12/19/11 0829   12/20/11 1200   vancomycin (VANCOCIN) 500 mg in sodium chloride 0.9 % 100 mL IVPB  Status:  Discontinued        500 mg 100 mL/hr over 60 Minutes Intravenous Every T-Th-Sa (Hemodialysis) 12/19/11 1244 12/21/11 2053   12/20/11 0300   vancomycin (VANCOCIN) 500 mg in sodium chloride 0.9 % 100 mL IVPB        500 mg 100 mL/hr over 60 Minutes Intravenous  Once 12/20/11 0238 12/20/11 0503   12/19/11 1800   aztreonam (AZACTAM) 500 mg in dextrose 5 % 50 mL IVPB  Status:   Discontinued        500 mg 100 mL/hr over 30 Minutes Intravenous Every 12 hours 12/19/11 0340 12/21/11 0959   12/19/11 1200   Darunavir Ethanolate (PREZISTA) tablet 800 mg        800 mg Oral Daily with breakfast 12/19/11 0829     12/19/11 1200   ritonavir (NORVIR) tablet 100 mg        100 mg Oral Daily with breakfast 12/19/11 0829     12/19/11 1200   DAPTOmycin (CUBICIN) 400 mg in sodium chloride 0.9 % IVPB  Status:  Discontinued        400 mg 216 mL/hr over 30 Minutes Intravenous Every T-Th-Sa (Hemodialysis) 12/19/11 1015 12/19/11 1158   12/19/11 1000   lamiVUDine (EPIVIR) 10 MG/ML solution 50 mg        50 mg Oral Daily 12/19/11 0829     12/19/11 1000   stavudine (ZERIT) capsule 15 mg  Status:  Discontinued        15 mg Oral Daily  12/19/11 0829 12/21/11 1320   12/19/11 0600   aztreonam (AZACTAM) 2 g in dextrose 5 % 50 mL IVPB  Status:  Discontinued        2 g 100 mL/hr over 30 Minutes Intravenous 3 times per day 12/19/11 0328 12/19/11 0339   12/19/11 0345   aztreonam (AZACTAM) 2 g in dextrose 5 % 50 mL IVPB        2 g 100 mL/hr over 30 Minutes Intravenous  Once 12/19/11 0330 12/19/11 0558   12/19/11 0345   levofloxacin (LEVAQUIN) IVPB 750 mg        750 mg 100 mL/hr over 90 Minutes Intravenous  Once 12/19/11 0331 12/19/11 0504   12/19/11 0345   vancomycin (VANCOCIN) IVPB 1000 mg/200 mL premix        1,000 mg 200 mL/hr over 60 Minutes Intravenous  Once 12/19/11 0340 12/19/11 0802   12/19/11 0330   levofloxacin (LEVAQUIN) IVPB 750 mg  Status:  Discontinued        750 mg 100 mL/hr over 90 Minutes Intravenous Every 24 hours 12/19/11 0328 12/19/11 0331          Medications: Scheduled Meds:   . aspirin EC  81 mg Oral Daily  . aztreonam  1 g Intravenous Q24H  . b complex vitamins  1 tablet Oral Daily  . DAPTOmycin (CUBICIN)  IV  6 mg/kg Intravenous Q T,Th,Sat-1800  . darbepoetin (ARANESP) injection - DIALYSIS  25 mcg Intravenous Q Sat-HD  . Darunavir Ethanolate  800 mg  Oral Q breakfast  . feeding supplement  237 mL Oral BID BM  . heparin  5,000 Units Subcutaneous Q8H  . lamiVUDine  50 mg Oral Daily  . midodrine  10 mg Oral BID AC  . multivitamin  1 tablet Oral Daily  . paricalcitol  4 mcg Intravenous Q T,Th,Sa-HD  . ritonavir  100 mg Oral Q breakfast  . stavudine  20 mg Oral Daily   Continuous Infusions:  PRN Meds:.acetaminophen, feeding supplement (NEPRO CARB STEADY), ondansetron (ZOFRAN) IV, ondansetron   Objective: Weight change: 1 lb 8.7 oz (0.7 kg)  Intake/Output Summary (Last 24 hours) at 12/22/11 1046 Last data filed at 12/22/11 0900  Gross per 24 hour  Intake 1167.6 ml  Output      1 ml  Net 1166.6 ml   Blood pressure 100/52, pulse 71, temperature 99.6 F (37.6 C), temperature source Oral, resp. rate 17, height 5\' 4"  (1.626 m), weight 144 lb 6.4 oz (65.5 kg), SpO2 99.00%. Temp:  [98.7 F (37.1 C)-101.3 F (38.5 C)] 99.6 F (37.6 C) (12/16 0700) Pulse Rate:  [69-99] 71  (12/16 0700) Resp:  [17-23] 17  (12/16 0700) BP: (82-106)/(40-59) 100/52 mmHg (12/16 0700) SpO2:  [96 %-100 %] 99 % (12/16 0700) Weight:  [144 lb 6.4 oz (65.5 kg)] 144 lb 6.4 oz (65.5 kg) (12/16 0327)  Physical Exam: General: Alert and awake, oriented x3, not in any acute distress. HEENT: anicteric sclera, pupils reactive to light and accommodation, EOMI CVS regular rate, normal r,  no murmur rubs or gallops Chest:few rhonchi at bases Abdomen: soft nontender, nondistended, normal bowel sounds, Extremities: no  clubbing or edema noted bilaterally Skin: no rashes Lymph: no new lymphadenopathy Neuro: nonfocal  Lab Results:  Basename 12/21/11 0810 12/20/11 1356  WBC 10.8* 9.3  HGB 9.6* 10.4*  HCT 28.4* 30.4*  PLT 216 223    BMET  Basename 12/20/11 1404 12/20/11 1356  NA 133* 131*  K 4.2 4.2  CL 95*  94*  CO2 24 25  GLUCOSE 89 93  BUN 46* 46*  CREATININE 5.91* 5.87*  CALCIUM 8.9 8.7    Micro Results: Recent Results (from the past 240 hour(s))    CULTURE, BLOOD (ROUTINE X 2)     Status: Normal (Preliminary result)   Collection Time   12/19/11  2:37 AM      Component Value Range Status Comment   Specimen Description BLOOD HAND LEFT   Final    Special Requests BOTTLES DRAWN AEROBIC AND ANAEROBIC 10CC   Final    Culture  Setup Time 12/19/2011 14:26   Final    Culture     Final    Value: GRAM POSITIVE COCCI IN PAIRS IN CHAINS     Note: Gram Stain Report Called to,Read Back By and Verified With: ROSEMARY CLARK 12/21/11 @ 5:55PM BY RUSCA.   Report Status PENDING   Incomplete   CULTURE, BLOOD (ROUTINE X 2)     Status: Normal (Preliminary result)   Collection Time   12/19/11  2:47 AM      Component Value Range Status Comment   Specimen Description BLOOD HAND LEFT   Final    Special Requests BOTTLES DRAWN AEROBIC ONLY 8CC   Final    Culture  Setup Time 12/19/2011 14:26   Final    Culture     Final    Value:        BLOOD CULTURE RECEIVED NO GROWTH TO DATE CULTURE WILL BE HELD FOR 5 DAYS BEFORE ISSUING A FINAL NEGATIVE REPORT   Report Status PENDING   Incomplete   MRSA PCR SCREENING     Status: Normal   Collection Time   12/19/11  8:29 AM      Component Value Range Status Comment   MRSA by PCR NEGATIVE  NEGATIVE Final   CULTURE, BLOOD (ROUTINE X 2)     Status: Normal (Preliminary result)   Collection Time   12/19/11 10:14 PM      Component Value Range Status Comment   Specimen Description BLOOD LEFT HAND   Final    Special Requests BOTTLES DRAWN AEROBIC ONLY 10CC   Final    Culture  Setup Time 12/20/2011 04:38   Final    Culture     Final    Value:        BLOOD CULTURE RECEIVED NO GROWTH TO DATE CULTURE WILL BE HELD FOR 5 DAYS BEFORE ISSUING A FINAL NEGATIVE REPORT   Report Status PENDING   Incomplete   CULTURE, BLOOD (ROUTINE X 2)     Status: Normal (Preliminary result)   Collection Time   12/19/11 10:35 PM      Component Value Range Status Comment   Specimen Description BLOOD LEFT HAND   Final    Special Requests BOTTLES  DRAWN AEROBIC AND ANAEROBIC 10CC   Final    Culture  Setup Time 12/20/2011 04:39   Final    Culture     Final    Value:        BLOOD CULTURE RECEIVED NO GROWTH TO DATE CULTURE WILL BE HELD FOR 5 DAYS BEFORE ISSUING A FINAL NEGATIVE REPORT   Report Status PENDING   Incomplete   CLOSTRIDIUM DIFFICILE BY PCR     Status: Normal   Collection Time   12/20/11  9:25 AM      Component Value Range Status Comment   C difficile by pcr NEGATIVE  NEGATIVE Final   STOOL CULTURE     Status:  Normal (Preliminary result)   Collection Time   12/20/11  9:25 AM      Component Value Range Status Comment   Specimen Description STOOL   Final    Special Requests NONE   Final    Culture NO SUSPICIOUS COLONIES, CONTINUING TO HOLD   Final    Report Status PENDING   Incomplete     Studies/Results: Dg Chest 2 View  12/21/2011  *RADIOLOGY REPORT*  Clinical Data: Pneumonia.  CHEST - 2 VIEW  Comparison: 12/19/2011  Findings: Worsening pulmonary consolidation is seen in the left upper lobe.  Right lung remains clear.  No evidence of pleural effusion.  Heart size remains stable.  IMPRESSION: Worsening left upper lobe consolidation.   Original Report Authenticated By: Earle Gell, M.D.    Ct Chest W Contrast  12/21/2011  *RADIOLOGY REPORT*  Clinical Data: Worsening pneumonia.  HIV.  The pancytopenia. Congestive cardiomyopathy.  CT CHEST WITH CONTRAST  Technique:  Multidetector CT imaging of the chest was performed following the standard protocol during bolus administration of intravenous contrast.  Contrast: 83mL OMNIPAQUE IOHEXOL 300 MG/ML  SOLN  Comparison: None.  Findings: Confluent left upper lobe infiltrate is seen containing a central air bronchograms, consistent with pneumonia.  No evidence of central endobronchial obstruction.  No discrete pulmonary nodules or masses are identified.  The mild mediastinal and bilateral hilar lymphadenopathy is seen which is nonspecific.  There is also mild bilateral axillary  lymphadenopathy.  The tiny right-sided pleural effusion is noted, however right lung is otherwise clear.  No evidence of left-sided pleural effusion.  IMPRESSION:  1.  Confluent left upper lobe infiltrate with air bronchograms, consistent with pneumonia. 2.  Mild mediastinal, bilateral hilar, and bilateral axillary lymphadenopathy, which is nonspecific.  Differential diagnosis includes infectious, inflammatory, and neoplastic etiologies. 3.  Tiny right pleural effusion.   Original Report Authenticated By: Earle Gell, M.D.       Assessment/Plan: KAEDA GOLDBLUM is a 56 y.o. 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 Will await ID on this. OK to continue the dapto for now in case this is VRE but if it is only in one of FOUR cultures drawn on the 13th it will be of dubious significance (unless it turns out to be MRSA or MSSA)  # 3 HIV: continue current regimen, farily nicely suppressed VL of 113 likely a "blip" cd4 greater than 300   LOS: 3 days   Alcide Evener 12/22/2011, 10:46 AM

## 2011-12-22 NOTE — Progress Notes (Signed)
ANTIBIOTIC CONSULT NOTE - INITIAL  Pharmacy Consult for vancomycin Indication: pneumonia  Allergies  Allergen Reactions  . Penicillins Rash    Unknown reaction    Patient Measurements: Height: 5\' 4"  (162.6 cm) Weight: 144 lb 6.4 oz (65.5 kg) IBW/kg (Calculated) : 54.7    Vital Signs: Temp: 98 F (36.7 C) (12/16 1145) Temp src: Oral (12/16 1145) BP: 90/49 mmHg (12/16 1145) Pulse Rate: 74  (12/16 1145) Intake/Output from previous day: 12/15 0701 - 12/16 0700 In: 1047.6 [P.O.:940; IV Piggyback:107.6] Out: 2 [Stool:2] Intake/Output from this shift: Total I/O In: 360 [P.O.:360] Out: 0   Labs:  [facility] 12/22/11 0941 12/21/11 0810 12/20/11 1404 12/20/11 1356  WBC 9.3 10.8* -- 9.3  HGB 9.7* 9.6* -- 10.4*  PLT 264 216 -- 223  LABCREA -- -- -- --  CREATININE -- -- 5.91* 5.87*   Estimated Creatinine Clearance: 9.3 ml/min (by C-G formula based on Cr of 5.91). No results found for this basename: VANCOTROUGH:2,VANCOPEAK:2,VANCORANDOM:2,GENTTROUGH:2,GENTPEAK:2,GENTRANDOM:2,TOBRATROUGH:2,TOBRAPEAK:2,TOBRARND:2,AMIKACINPEAK:2,AMIKACINTROU:2,AMIKACIN:2, in the last 72 hours   Microbiology: Recent Results (from the past 720 hour(s))  CULTURE, BLOOD (ROUTINE X 2)     Status: Normal (Preliminary result)   Collection Time   12/19/11  2:37 AM      Component Value Range Status Comment   Specimen Description BLOOD HAND LEFT   Final    Special Requests BOTTLES DRAWN AEROBIC AND ANAEROBIC 10CC   Final    Culture  Setup Time 12/19/2011 14:26   Final    Culture     Final    Value: GRAM POSITIVE COCCI IN PAIRS IN CHAINS     Note: Gram Stain Report Called to,Read Back By and Verified With: ROSEMARY CLARK 12/21/11 @ 5:55PM BY RUSCA.   Report Status PENDING   Incomplete   CULTURE, BLOOD (ROUTINE X 2)     Status: Normal (Preliminary result)   Collection Time   12/19/11  2:47 AM      Component Value Range Status Comment   Specimen Description BLOOD HAND LEFT   Final    Special Requests  BOTTLES DRAWN AEROBIC ONLY 8CC   Final    Culture  Setup Time 12/19/2011 14:26   Final    Culture     Final    Value:        BLOOD CULTURE RECEIVED NO GROWTH TO DATE CULTURE WILL BE HELD FOR 5 DAYS BEFORE ISSUING A FINAL NEGATIVE REPORT   Report Status PENDING   Incomplete   MRSA PCR SCREENING     Status: Normal   Collection Time   12/19/11  8:29 AM      Component Value Range Status Comment   MRSA by PCR NEGATIVE  NEGATIVE Final   CULTURE, BLOOD (ROUTINE X 2)     Status: Normal (Preliminary result)   Collection Time   12/19/11 10:14 PM      Component Value Range Status Comment   Specimen Description BLOOD LEFT HAND   Final    Special Requests BOTTLES DRAWN AEROBIC ONLY 10CC   Final    Culture  Setup Time 12/20/2011 04:38   Final    Culture     Final    Value:        BLOOD CULTURE RECEIVED NO GROWTH TO DATE CULTURE WILL BE HELD FOR 5 DAYS BEFORE ISSUING A FINAL NEGATIVE REPORT   Report Status PENDING   Incomplete   CULTURE, BLOOD (ROUTINE X 2)     Status: Normal (Preliminary result)   Collection Time  12/19/11 10:35 PM      Component Value Range Status Comment   Specimen Description BLOOD LEFT HAND   Final    Special Requests BOTTLES DRAWN AEROBIC AND ANAEROBIC 10CC   Final    Culture  Setup Time 12/20/2011 04:39   Final    Culture     Final    Value:        BLOOD CULTURE RECEIVED NO GROWTH TO DATE CULTURE WILL BE HELD FOR 5 DAYS BEFORE ISSUING A FINAL NEGATIVE REPORT   Report Status PENDING   Incomplete   CLOSTRIDIUM DIFFICILE BY PCR     Status: Normal   Collection Time   12/20/11  9:25 AM      Component Value Range Status Comment   C difficile by pcr NEGATIVE  NEGATIVE Final   STOOL CULTURE     Status: Normal (Preliminary result)   Collection Time   12/20/11  9:25 AM      Component Value Range Status Comment   Specimen Description STOOL   Final    Special Requests NONE   Final    Culture NO SUSPICIOUS COLONIES, CONTINUING TO HOLD   Final    Report Status PENDING    Incomplete     Medical History: Past Medical History  Diagnosis Date  . Human immunodeficiency virus (HIV) disease   . Hypertension   . ESRD (end stage renal disease)   . Dialysis patient     pt on dialysis since 2010.  [name] Clostridium difficile infection 04/04/10    07/2011 and 08/2011  . Bacteriuria, asymptomatic 04/04/10    Culture grew VRE sensitive to linesolid   . MGUS (monoclonal gammopathy of unknown significance)   . History of bacteremia     MSSA  . Renal insufficiency   . Anemia   . Bacteremia     05/2011 and 08/2011    Assessment: Patient is a 56 y.o ESRD F with vancomycin started on 12/13 and d/ced on 12/15.  Per ID, resuming vancomycin today to complete 7 day course for HCAP.  Goal of Therapy:  Pre-HD vancomycin level= 15-25  Plan:  1) resume vancomycin 500mg  after each HD (TTS)  Fitz Matsuo P 12/22/2011,12:10 PM

## 2011-12-22 NOTE — Progress Notes (Signed)
Pt transferred to unit [id number]. This CSW has informed unit CSW who will facilitate with dc to [facility] when pt is medically stable. This CSW signing off.  Hunt Oris, MSW, [place]

## 2011-12-23 ENCOUNTER — Encounter (HOSPITAL_COMMUNITY): Payer: Self-pay | Admitting: Internal Medicine

## 2011-12-23 LAB — FECAL LACTOFERRIN, QUANT: Fecal Lactoferrin: POSITIVE

## 2011-12-23 LAB — CBC WITH DIFFERENTIAL/PLATELET
Basophils Absolute: 0.1 10*3/uL (ref 0.0–0.1)
Basophils Relative: 1 % (ref 0–1)
Eosinophils Absolute: 0.5 10*3/uL (ref 0.0–0.7)
Eosinophils Relative: 9 % — ABNORMAL HIGH (ref 0–5)
HCT: 26.6 % — ABNORMAL LOW (ref 36.0–46.0)
Hemoglobin: 8.9 g/dL — ABNORMAL LOW (ref 12.0–15.0)
Lymphocytes Relative: 20 % (ref 12–46)
Lymphs Abs: 1.1 10*3/uL (ref 0.7–4.0)
MCH: 35.5 pg — ABNORMAL HIGH (ref 26.0–34.0)
MCHC: 33.5 g/dL (ref 30.0–36.0)
MCV: 106 fL — ABNORMAL HIGH (ref 78.0–100.0)
Monocytes Absolute: 0.2 10*3/uL (ref 0.1–1.0)
Monocytes Relative: 4 % (ref 3–12)
Neutro Abs: 3.7 10*3/uL (ref 1.7–7.7)
Neutrophils Relative %: 66 % (ref 43–77)
Platelets: 283 10*3/uL (ref 150–400)
RBC: 2.51 MIL/uL — ABNORMAL LOW (ref 3.87–5.11)
RDW: 16.6 % — ABNORMAL HIGH (ref 11.5–15.5)
WBC: 5.6 10*3/uL (ref 4.0–10.5)

## 2011-12-23 LAB — OVA AND PARASITE EXAMINATION: Ova and parasites: NONE SEEN

## 2011-12-23 LAB — RENAL FUNCTION PANEL
Albumin: 2.1 g/dL — ABNORMAL LOW (ref 3.5–5.2)
BUN: 46 mg/dL — ABNORMAL HIGH (ref 6–23)
CO2: 23 mEq/L (ref 19–32)
Calcium: 9.2 mg/dL (ref 8.4–10.5)
Chloride: 92 mEq/L — ABNORMAL LOW (ref 96–112)
Creatinine, Ser: 6.64 mg/dL — ABNORMAL HIGH (ref 0.50–1.10)
GFR calc Af Amer: 7 mL/min — ABNORMAL LOW (ref >90)
GFR calc non Af Amer: 6 mL/min — ABNORMAL LOW (ref >90)
Glucose, Bld: 81 mg/dL (ref 70–99)
Phosphorus: 3.2 mg/dL (ref 2.3–4.6)
Potassium: 3.4 mEq/L — ABNORMAL LOW (ref 3.5–5.1)
Sodium: 131 mEq/L — ABNORMAL LOW (ref 135–145)

## 2011-12-23 LAB — STOOL CULTURE

## 2011-12-23 MED ORDER — NEPRO/CARBSTEADY PO LIQD: 237.0000 mL | ORAL | Status: DC | PRN

## 2011-12-23 MED ORDER — SODIUM CHLORIDE 0.9 % IV SOLN: 100.0000 mL | INTRAVENOUS | Status: DC | PRN

## 2011-12-23 MED ORDER — PARICALCITOL 5 MCG/ML IV SOLN
INTRAVENOUS | Status: AC
  Administered 2011-12-23: 4 ug via INTRAVENOUS
  Filled 2011-12-23: qty 1

## 2011-12-23 MED ORDER — PENTAFLUOROPROP-TETRAFLUOROETH EX AERO: 1.0000 "application " | INHALATION_SPRAY | CUTANEOUS | Status: DC | PRN

## 2011-12-23 MED ORDER — LIDOCAINE-PRILOCAINE 2.5-2.5 % EX CREA: 1.0000 "application " | TOPICAL_CREAM | CUTANEOUS | Status: DC | PRN

## 2011-12-23 MED ORDER — LIDOCAINE HCL (PF) 1 % IJ SOLN: 5.0000 mL | INTRAMUSCULAR | Status: DC | PRN

## 2011-12-23 MED ORDER — GUAIFENESIN 100 MG/5ML PO SOLN
10.0000 mL | Freq: Four times a day (QID) | ORAL | Status: DC | PRN
  Administered 2011-12-23 – 2011-12-24 (×2): 200 mg via ORAL
  Filled 2011-12-23 (×4): qty 10

## 2011-12-23 MED ORDER — HEPARIN SODIUM (PORCINE) 1000 UNIT/ML DIALYSIS: 1000.0000 [IU] | INTRAMUSCULAR | Status: DC | PRN

## 2011-12-23 MED ORDER — HEPARIN SODIUM (PORCINE) 1000 UNIT/ML DIALYSIS: 5900.0000 [IU] | INTRAMUSCULAR | Status: DC | PRN

## 2011-12-23 MED ORDER — GUAIFENESIN 100 MG/5ML PO SYRP
200.0000 mg | ORAL_SOLUTION | Freq: Four times a day (QID) | ORAL | Status: DC | PRN
  Filled 2011-12-23: qty 118

## 2011-12-23 MED ORDER — VANCOMYCIN HCL 1000 MG IV SOLR
750.0000 mg | INTRAVENOUS | Status: DC
  Administered 2011-12-23: 750 mg via INTRAVENOUS
  Filled 2011-12-23: qty 750

## 2011-12-23 MED ORDER — ALTEPLASE 2 MG IJ SOLR: 2.0000 mg | Freq: Once | INTRAMUSCULAR | Status: AC | PRN

## 2011-12-23 NOTE — Progress Notes (Signed)
Subjective:   Nonproductive cough, loose bowel movements (2 this AM), on way to dialysis.  Objective: Vital signs in last 24 hours: Temp:  [97.5 F (36.4 C)-98 F (36.7 C)] 97.5 F (36.4 C) (12/17 0548) Pulse Rate:  [66-80] 66  (12/17 0548) Resp:  [18] 18  (12/17 0548) BP: (81-94)/(42-55) 93/49 mmHg (12/17 0548) SpO2:  [98 %-100 %] 98 % (12/17 0548) Weight:  [66.1 kg (145 lb 11.6 oz)] 66.1 kg (145 lb 11.6 oz) (12/16 2027) Weight change: 0.6 kg (1 lb 5.2 oz)  Intake/Output from previous day: 12/16 0701 - 12/17 0700 In: 720 [P.O.:720] Out: 0    EXAM: General appearance:  Alert, in no apparent distress Resp: Mildly coarse breath sounds bilaterally   Cardio:  RRR without murmur or rub GI: + BS, soft and nontender Extremities:  No edema Access:  AVG @ RUA with + bruit  Lab Results:  [facility] 12/22/11 0941 12/21/11 0810  WBC 9.3 10.8*  HGB 9.7* 9.6*  HCT 28.6* 28.4*  PLT 264 216   BMET:  Basename 12/20/11 1404 12/20/11 1356  NA 133* 131*  K 4.2 4.2  CL 95* 94*  CO2 24 25  GLUCOSE 89 93  BUN 46* 46*  CREATININE 5.91* 5.87*  CALCIUM 8.9 8.7  ALBUMIN 2.2* 2.2*   No results found for this basename: PTH:2 in the last 72 hours Iron Studies: No results found for this basename: IRON,TIBC,TRANSFERRIN,FERRITIN in the last 72 hours  Dialysis Orders: Center: [facility] on TTS.  EDW 61 kg HD Bath 2K/2.25Ca Time 4 hrs Heparin 5900 U. Access AVG @ RUA BFR 400 DFR 800  Zemplar 4 mcg IV/HD Epogen 13,400 Units IV/HD Venofer 50 mg on Thurs  Assessment/Plan: 1. Fever/cough/chest pain - believed to be secondary to left pneumonia per chest x-ray; ; right IJ catheter removed 10/14, but Hx VRE bacteremia (most recently in 08/2011); loose bowel movements this AM with Hx C difficile (most recently in 08/2011), C diff and stool culture 12/14 negative; 1 of 4 blood cultures 12/13 with Gr + cocci; on IV Aztreonam and Vancomycin through today, continue Daptomycin per ID.  After several days of fevers is  now defervescing. Feels much better.  2. ESRD - HD on TTS @ [place]; K 4.2. HD pending today. 3. Hypotension/volume - BP 93/49 on Midodrine 10 mg bid; current wt 65.5 kg with EDW 61 kg. UF goal of 4 L today. 4. Anemia - Hgb 10.6 on outpatient Epogen 13,400 U and IV Fe (Thurs), but last T-sat 44%. Follow CBC tomorrow to determine Aranesp. 5. Metabolic bone disease - Ca 8.9 (10.3 corrected), P 4.8, last iPTH 163 on 10/24; on Zemplar 4 mcg, Phoslo 1 with meals. 6. Nutrition - Alb 2.2, high protein renal diet. 7. HIV - on antiviral meds per ID, last CD4 310 and viral load 367 in 10/2011.   LOS: 4 days   LYLES,CHARLES 12/23/2011,7:53 AM  Patient seen and examined and agree with assessment and plan as above with additions as indicated.  Kelly Splinter  MD [facility] [phone] pgr    [phone] cell 12/23/2011, 10:54 AM

## 2011-12-23 NOTE — Procedures (Signed)
4 hour hemodialysis treatment completed through right upper arm AVG (15g ante/retrograde).  BP unable to tolerate removal of 4L as ordered.  UF maintained as long as SBP>80.  2.1L removed with 1 hour and 43 minutes of UF interrupt time.  Ramiro Harvest, PA-C notified.

## 2011-12-23 NOTE — Procedures (Signed)
I was present at this dialysis session. I have reviewed the session itself and made appropriate changes.   Kelly Splinter, MD [employer] 12/23/2011, 10:17 AM

## 2011-12-23 NOTE — Progress Notes (Addendum)
Subjective: Patient has had 2 episodes of loose diarrhea that are not watery.  She complains about not getting help to the bathroom in time.  She feels she can go to the bathroom on her own.  She also states she wants her diet changed as she does not like her current diet.  She denies sob.  She still has a cough.  She denies chest pain, ab pain.    Objective: Vital signs in last 24 hours: Filed Vitals:   12/23/11 1200 12/23/11 1215 12/23/11 1235 12/23/11 1318  BP: 70/45 71/49 80/51  80/51  Pulse: 73 73 68 78  Temp:   98.3 F (36.8 C) 97.9 F (36.6 C)  TempSrc:   Oral Oral  Resp: 22 18 20 18   Height:      Weight:      SpO2:   98% 97%   Weight change: 1 lb 5.2 oz (0.6 kg)  Intake/Output Summary (Last 24 hours) at 12/23/11 1652 Last data filed at 12/23/11 1235  Gross per 24 hour  Intake    240 ml  Output   2167 ml  Net  -1927 ml   General: lying in bed, nad, alert and oriented x 3, talkative  HEENT: [place]/at CV: RRR no rubs, murmurs, or gallops Lungs: mild crackles b/l Abdomen: soft, ntnd, normal bs Extremities: no cyanosis or edema, warm, left lower extremity with increased size compared with right lower extremity. Also LLE with hyperpigmentation Skin: no evidence of breakdown, intact Neuro: CN 2-12 grossly intact, moving all 4 extremities  Lab Results: Basic Metabolic Panel:  Lab [id number] 0800 12/20/11 1404  NA 131* 133*  K 3.4* 4.2  CL 92* 95*  CO2 23 24  GLUCOSE 81 89  BUN 46* 46*  CREATININE 6.64* 5.91*  CALCIUM 9.2 8.9  MG -- --  PHOS 3.2 4.8*   Liver Function Tests:  Lab 12/23/11 0800 12/20/11 1404 12/20/11 1356 12/19/11 0253  AST -- -- 27 27  ALT -- -- 10 13  ALKPHOS -- -- 124* 126*  BILITOT -- -- 0.9 0.8  PROT -- -- 8.9* 8.6*  ALBUMIN 2.1* 2.2* -- --   CBC:  Lab 12/23/11 0800 12/22/11 0941  WBC 5.6 9.3  NEUTROABS 3.7 7.4  HGB 8.9* 9.7*  HCT 26.6* 28.6*  MCV 106.0* 106.3*  PLT 283 264   Cardiac Enzymes:  Lab 12/22/11 0535 12/19/11 0518   CKTOTAL 245* --  CKMB -- --  CKMBINDEX -- --  TROPONINI -- <0.30   BNP:  Lab 12/19/11 0236  PROBNP AC:[id number]*   Misc. Labs:none  Micro Results: Recent Results (from the past 240 hour(s))  CULTURE, BLOOD (ROUTINE X 2)     Status: Normal (Preliminary result)   Collection Time   12/19/11  2:37 AM      Component Value Range Status Comment   Specimen Description BLOOD HAND LEFT   Final    Special Requests BOTTLES DRAWN AEROBIC AND ANAEROBIC 10CC   Final    Culture  Setup Time 12/19/2011 14:26   Final    Culture     Final    Value: GRAM POSITIVE COCCI IN PAIRS IN CHAINS     Note: Gram Stain Report Called to,Read Back By and Verified With: ROSEMARY CLARK 12/21/11 @ 5:55PM BY RUSCA.   Report Status PENDING   Incomplete   CULTURE, BLOOD (ROUTINE X 2)     Status: Normal (Preliminary result)   Collection Time   12/19/11  2:47 AM  Component Value Range Status Comment   Specimen Description BLOOD HAND LEFT   Final    Special Requests BOTTLES DRAWN AEROBIC ONLY 8CC   Final    Culture  Setup Time 12/19/2011 14:26   Final    Culture     Final    Value:        BLOOD CULTURE RECEIVED NO GROWTH TO DATE CULTURE WILL BE HELD FOR 5 DAYS BEFORE ISSUING A FINAL NEGATIVE REPORT   Report Status PENDING   Incomplete   MRSA PCR SCREENING     Status: Normal   Collection Time   12/19/11  8:29 AM      Component Value Range Status Comment   MRSA by PCR NEGATIVE  NEGATIVE Final   CULTURE, BLOOD (ROUTINE X 2)     Status: Normal (Preliminary result)   Collection Time   12/19/11 10:14 PM      Component Value Range Status Comment   Specimen Description BLOOD LEFT HAND   Final    Special Requests BOTTLES DRAWN AEROBIC ONLY 10CC   Final    Culture  Setup Time 12/20/2011 04:38   Final    Culture     Final    Value:        BLOOD CULTURE RECEIVED NO GROWTH TO DATE CULTURE WILL BE HELD FOR 5 DAYS BEFORE ISSUING A FINAL NEGATIVE REPORT   Report Status PENDING   Incomplete   CULTURE, BLOOD (ROUTINE X 2)      Status: Normal (Preliminary result)   Collection Time   12/19/11 10:35 PM      Component Value Range Status Comment   Specimen Description BLOOD LEFT HAND   Final    Special Requests BOTTLES DRAWN AEROBIC AND ANAEROBIC 10CC   Final    Culture  Setup Time 12/20/2011 04:39   Final    Culture     Final    Value:        BLOOD CULTURE RECEIVED NO GROWTH TO DATE CULTURE WILL BE HELD FOR 5 DAYS BEFORE ISSUING A FINAL NEGATIVE REPORT   Report Status PENDING   Incomplete   CLOSTRIDIUM DIFFICILE BY PCR     Status: Normal   Collection Time   12/20/11  9:25 AM      Component Value Range Status Comment   C difficile by pcr NEGATIVE  NEGATIVE Final   STOOL CULTURE     Status: Normal   Collection Time   12/20/11  9:25 AM      Component Value Range Status Comment   Specimen Description STOOL   Final    Special Requests NONE   Final    Culture     Final    Value: NO SALMONELLA, SHIGELLA, CAMPYLOBACTER, YERSINIA, OR E.COLI 0157:H7 ISOLATED   Report Status 12/23/2011 FINAL   Final   OVA AND PARASITE EXAMINATION     Status: Normal   Collection Time   12/20/11  9:25 AM      Component Value Range Status Comment   Specimen Description STOOL   Final    Special Requests NONE   Final    Ova and parasites NO OVA OR PARASITES SEEN   Final    Report Status 12/23/2011 FINAL   Final    Studies/Results: No results found. Medications:  Scheduled Meds:    . aspirin EC  81 mg Oral Daily  . b complex vitamins  1 tablet Oral Daily  . darbepoetin (ARANESP) injection - DIALYSIS  25 mcg Intravenous  Q Sat-HD  . Darunavir Ethanolate  800 mg Oral Q breakfast  . feeding supplement  237 mL Oral BID BM  . heparin  5,000 Units Subcutaneous Q8H  . lamiVUDine  50 mg Oral Daily  . midodrine  10 mg Oral BID AC  . multivitamin  1 tablet Oral Daily  . paricalcitol  4 mcg Intravenous Q T,Th,Sa-HD  . ritonavir  100 mg Oral Q breakfast  . stavudine  20 mg Oral Daily  . 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 She presented with dry cough of over one week and chest pain which is now resolved. CXR with Left mid lung infiltrate, febrile with temp of 101.64F, hypotension and WBC of 10.2 (was 4.6 in 10/22/11).   1. HCAP:  -Afebrile x 24 hours  -CT chest with confluent LUL infiltrate with air bronchograms pneumonia.  Mild mediastinal b/l hilar, b/l axillary lymphadenopathy nonspecific.  Infectious, inflammatory, neoplastic possible etiologies.  Tiny right pleural effusion -Continue Aztreonam Day 5.  ID recommends d/c this tomorrow.  Will continue Vancomycin -UP date recs tx duration 7 days  -Negative influenza panel -ID following, will follow recs and blood cultures -will possibly d/c tomorrow back to [name] if she remains afebrile  2. Possible bacteremia -With history of VRE, MSSA will use contact precautions -1/4  blood cultures with gram positive cocci in pairs in chains, pending results but this may be a contaminant.  -will f/u blood cultures  -She will continue Vancomycin Day 3/3 doses.  She has been given Daptomycin 2 doses   3. ESRD on HD T/Th/S  -renal following  -Continue midodrine 10 mg qd for intermittent hypotension.   4. Hypotension  -BP 80s-90ss/40s-50s.   -monitor BP. Patient runs hypotensive with HD and she had it today -continue Midodrine 10 mg qd   5. Diarrhea  -contact precautions. Etiology possibly related to antibiotic use this admission.   -stool studies O&P, stool culture negative; C.diff negative -pending fobt to be collected; fecal fat normal & lactoferrin positive which may indicate inflammation in the bowel  6. HIV:   -This admission viral quant 114, cd ct 160 down from 310 in 10/2011  -Continue norvir, zerit, and prezista, Epivir   7. F/E/N  -careful if needs fluid bolus history of systolic dysfunction  -will follow labs (renal panel)  -diet ordered - patient wanted renal diet changed to regular diet if this will improve her intake agreeable  Dispo: Disposition is deferred at this time, awaiting improvement of current medical problems. Anticipated discharge in approximately 1day(s).   The patient does have a current PCP ([name], ANKIT, [name]), therefore will be requiring OPC follow-up after discharge.  The patient does not have transportation limitations that hinder transportation to clinic appointments.    .Services Needed at time of discharge: Y = Yes, Blank = No PT: Y- at [facility]   OT:   RN:   Equipment:   Other:     LOS: 4 days   Cresenciano Genre [id number] 12/23/2011, 4:52 PM

## 2011-12-23 NOTE — Progress Notes (Signed)
[facility] for Infectious [facility]  Day #7 antibiotics Day # 7 aztreonam Day # 3 daptomycin Received Vancomycin from 12/12 thru 12/14  Subjective: No new complaints feels better   Antibiotics:  Anti-infectives     Start     Dose/Rate Route Frequency Ordered Stop   12/23/11 1800   DAPTOmycin (CUBICIN) 380 mg in sodium chloride 0.9 % IVPB  Status:  Discontinued        6 mg/kg  63.3 kg 215.2 mL/hr over 30 Minutes Intravenous Every T-Th-Sa (1800) 12/22/11 1005 12/23/11 1421   12/23/11 1200   vancomycin (VANCOCIN) 500 mg in sodium chloride 0.9 % 100 mL IVPB  Status:  Discontinued        500 mg 100 mL/hr over 60 Minutes Intravenous Every T-Th-Sa (Hemodialysis) 12/22/11 1227 12/23/11 0952   12/23/11 1200   vancomycin (VANCOCIN) 750 mg in sodium chloride 0.9 % 150 mL IVPB        750 mg 150 mL/hr over 60 Minutes Intravenous Every T-Th-Sa (Hemodialysis) 12/23/11 0952     12/22/11 1000   aztreonam (AZACTAM) 1 g in dextrose 5 % 50 mL IVPB        1 g 100 mL/hr over 30 Minutes Intravenous Every 24 hours 12/21/11 0959 12/23/11 1406   12/22/11 1000   stavudine (ZERIT) capsule 20 mg        20 mg Oral Daily 12/21/11 1320     12/21/11 2200   DAPTOmycin (CUBICIN) 380 mg in sodium chloride 0.9 % IVPB  Status:  Discontinued        6 mg/kg  63.3 kg 215.2 mL/hr over 30 Minutes Intravenous Every 48 hours 12/21/11 2110 12/22/11 1005   12/21/11 1030   aztreonam (AZACTAM) 500 mg in dextrose 5 % 50 mL IVPB        500 mg 100 mL/hr over 30 Minutes Intravenous  Once 12/21/11 0959 12/21/11 1249   12/20/11 2200   levofloxacin (LEVAQUIN) IVPB 500 mg  Status:  Discontinued        500 mg 100 mL/hr over 60 Minutes Intravenous Every 48 hours 12/19/11 0331 12/19/11 1727   12/20/11 1200   vancomycin (VANCOCIN) 500 mg in sodium chloride 0.9 % 100 mL IVPB  Status:  Discontinued        500 mg 100 mL/hr over 60 Minutes Intravenous Every T-Th-Sa (Hemodialysis) 12/19/11 0340 12/19/11 0829   12/20/11 1200    vancomycin (VANCOCIN) 500 mg in sodium chloride 0.9 % 100 mL IVPB  Status:  Discontinued        500 mg 100 mL/hr over 60 Minutes Intravenous Every T-Th-Sa (Hemodialysis) 12/19/11 1244 12/21/11 2053   12/20/11 0300   vancomycin (VANCOCIN) 500 mg in sodium chloride 0.9 % 100 mL IVPB        500 mg 100 mL/hr over 60 Minutes Intravenous  Once 12/20/11 0238 12/20/11 0503   12/19/11 1800   aztreonam (AZACTAM) 500 mg in dextrose 5 % 50 mL IVPB  Status:  Discontinued        500 mg 100 mL/hr over 30 Minutes Intravenous Every 12 hours 12/19/11 0340 12/21/11 0959   12/19/11 1200   Darunavir Ethanolate (PREZISTA) tablet 800 mg        800 mg Oral Daily with breakfast 12/19/11 0829     12/19/11 1200   ritonavir (NORVIR) tablet 100 mg        100 mg Oral Daily with breakfast 12/19/11 0829     12/19/11 1200   DAPTOmycin (  CUBICIN) 400 mg in sodium chloride 0.9 % IVPB  Status:  Discontinued        400 mg 216 mL/hr over 30 Minutes Intravenous Every T-Th-Sa (Hemodialysis) 12/19/11 1015 12/19/11 1158   12/19/11 1000   lamiVUDine (EPIVIR) 10 MG/ML solution 50 mg        50 mg Oral Daily 12/19/11 0829     12/19/11 1000   stavudine (ZERIT) capsule 15 mg  Status:  Discontinued        15 mg Oral Daily 12/19/11 0829 12/21/11 1320   12/19/11 0600   aztreonam (AZACTAM) 2 g in dextrose 5 % 50 mL IVPB  Status:  Discontinued        2 g 100 mL/hr over 30 Minutes Intravenous 3 times per day 12/19/11 0328 12/19/11 0339   12/19/11 0345   aztreonam (AZACTAM) 2 g in dextrose 5 % 50 mL IVPB        2 g 100 mL/hr over 30 Minutes Intravenous  Once 12/19/11 0330 12/19/11 0558   12/19/11 0345   levofloxacin (LEVAQUIN) IVPB 750 mg        750 mg 100 mL/hr over 90 Minutes Intravenous  Once 12/19/11 0331 12/19/11 0504   12/19/11 0345   vancomycin (VANCOCIN) IVPB 1000 mg/200 mL premix        1,000 mg 200 mL/hr over 60 Minutes Intravenous  Once 12/19/11 0340 12/19/11 0802   12/19/11 0330   levofloxacin (LEVAQUIN) IVPB 750  mg  Status:  Discontinued        750 mg 100 mL/hr over 90 Minutes Intravenous Every 24 hours 12/19/11 0328 12/19/11 0331          Medications: Scheduled Meds:    . aspirin EC  81 mg Oral Daily  . b complex vitamins  1 tablet Oral Daily  . darbepoetin (ARANESP) injection - DIALYSIS  25 mcg Intravenous Q Sat-HD  . Darunavir Ethanolate  800 mg Oral Q breakfast  . feeding supplement  237 mL Oral BID BM  . heparin  5,000 Units Subcutaneous Q8H  . lamiVUDine  50 mg Oral Daily  . midodrine  10 mg Oral BID AC  . multivitamin  1 tablet Oral Daily  . paricalcitol  4 mcg Intravenous Q T,Th,Sa-HD  . ritonavir  100 mg Oral Q breakfast  . stavudine  20 mg Oral Daily  . vancomycin  750 mg Intravenous Q T,Th,Sa-HD   Continuous Infusions:  PRN Meds:.sodium chloride, sodium chloride, acetaminophen, alteplase, feeding supplement (NEPRO CARB STEADY), heparin, heparin, lidocaine, lidocaine-prilocaine, ondansetron (ZOFRAN) IV, ondansetron, pentafluoroprop-tetrafluoroeth   Objective: Weight change: 1 lb 5.2 oz (0.6 kg)  Intake/Output Summary (Last 24 hours) at 12/23/11 1422 Last data filed at 12/23/11 1235  Gross per 24 hour  Intake    240 ml  Output   2167 ml  Net  -1927 ml   Blood pressure 80/51, pulse 78, temperature 97.9 F (36.6 C), temperature source Oral, resp. rate 18, height 5\' 4"  (1.626 m), weight 144 lb 6.4 oz (65.5 kg), SpO2 97.00%. Temp:  [97.2 F (36.2 C)-98.3 F (36.8 C)] 97.9 F (36.6 C) (12/17 1318) Pulse Rate:  [58-79] 78  (12/17 1318) Resp:  [17-22] 18  (12/17 1318) BP: (70-108)/(42-62) 80/51 mmHg (12/17 1318) SpO2:  [95 %-100 %] 97 % (12/17 1318) Weight:  [144 lb 6.4 oz (65.5 kg)-145 lb 11.6 oz (66.1 kg)] 144 lb 6.4 oz (65.5 kg) (12/17 0815)  Physical Exam: General: Alert and awake, oriented x3, not in any acute distress.  HEENT: anicteric sclera, pupils reactive to light and accommodation, EOMI CVS regular rate, normal r,  no murmur rubs or gallops Chest:few  rhonchi at bases Abdomen: soft nontender, nondistended, normal bowel sounds, Extremities: no  clubbing or edema noted bilaterally Skin: no rashes Lymph: no new lymphadenopathy Neuro: nonfocal  Lab Results:  Basename 12/23/11 0800 12/22/11 0941  WBC 5.6 9.3  HGB 8.9* 9.7*  HCT 26.6* 28.6*  PLT 283 264    BMET  Basename 12/23/11 0800  NA 131*  K 3.4*  CL 92*  CO2 23  GLUCOSE 81  BUN 46*  CREATININE 6.64*  CALCIUM 9.2    Micro Results: Recent Results (from the past 240 hour(s))  CULTURE, BLOOD (ROUTINE X 2)     Status: Normal (Preliminary result)   Collection Time   12/19/11  2:37 AM      Component Value Range Status Comment   Specimen Description BLOOD HAND LEFT   Final    Special Requests BOTTLES DRAWN AEROBIC AND ANAEROBIC 10CC   Final    Culture  Setup Time 12/19/2011 14:26   Final    Culture     Final    Value: GRAM POSITIVE COCCI IN PAIRS IN CHAINS     Note: Gram Stain Report Called to,Read Back By and Verified With: ROSEMARY CLARK 12/21/11 @ 5:55PM BY RUSCA.   Report Status PENDING   Incomplete   CULTURE, BLOOD (ROUTINE X 2)     Status: Normal (Preliminary result)   Collection Time   12/19/11  2:47 AM      Component Value Range Status Comment   Specimen Description BLOOD HAND LEFT   Final    Special Requests BOTTLES DRAWN AEROBIC ONLY 8CC   Final    Culture  Setup Time 12/19/2011 14:26   Final    Culture     Final    Value:        BLOOD CULTURE RECEIVED NO GROWTH TO DATE CULTURE WILL BE HELD FOR 5 DAYS BEFORE ISSUING A FINAL NEGATIVE REPORT   Report Status PENDING   Incomplete   MRSA PCR SCREENING     Status: Normal   Collection Time   12/19/11  8:29 AM      Component Value Range Status Comment   MRSA by PCR NEGATIVE  NEGATIVE Final   CULTURE, BLOOD (ROUTINE X 2)     Status: Normal (Preliminary result)   Collection Time   12/19/11 10:14 PM      Component Value Range Status Comment   Specimen Description BLOOD LEFT HAND   Final    Special Requests  BOTTLES DRAWN AEROBIC ONLY 10CC   Final    Culture  Setup Time 12/20/2011 04:38   Final    Culture     Final    Value:        BLOOD CULTURE RECEIVED NO GROWTH TO DATE CULTURE WILL BE HELD FOR 5 DAYS BEFORE ISSUING A FINAL NEGATIVE REPORT   Report Status PENDING   Incomplete   CULTURE, BLOOD (ROUTINE X 2)     Status: Normal (Preliminary result)   Collection Time   12/19/11 10:35 PM      Component Value Range Status Comment   Specimen Description BLOOD LEFT HAND   Final    Special Requests BOTTLES DRAWN AEROBIC AND ANAEROBIC 10CC   Final    Culture  Setup Time 12/20/2011 04:39   Final    Culture     Final    Value:  BLOOD CULTURE RECEIVED NO GROWTH TO DATE CULTURE WILL BE HELD FOR 5 DAYS BEFORE ISSUING A FINAL NEGATIVE REPORT   Report Status PENDING   Incomplete   CLOSTRIDIUM DIFFICILE BY PCR     Status: Normal   Collection Time   12/20/11  9:25 AM      Component Value Range Status Comment   C difficile by pcr NEGATIVE  NEGATIVE Final   STOOL CULTURE     Status: Normal   Collection Time   12/20/11  9:25 AM      Component Value Range Status Comment   Specimen Description STOOL   Final    Special Requests NONE   Final    Culture     Final    Value: NO SALMONELLA, SHIGELLA, CAMPYLOBACTER, YERSINIA, OR E.COLI 0157:H7 ISOLATED   Report Status 12/23/2011 FINAL   Final   OVA AND PARASITE EXAMINATION     Status: Normal   Collection Time   12/20/11  9:25 AM      Component Value Range Status Comment   Specimen Description STOOL   Final    Special Requests NONE   Final    Ova and parasites NO OVA OR PARASITES SEEN   Final    Report Status 12/23/2011 FINAL   Final     Studies/Results: Ct Chest W Contrast  12/21/2011  *RADIOLOGY REPORT*  Clinical Data: Worsening pneumonia.  HIV.  The pancytopenia. Congestive cardiomyopathy.  CT CHEST WITH CONTRAST  Technique:  Multidetector CT imaging of the chest was performed following the standard protocol during bolus administration of  intravenous contrast.  Contrast: 30mL OMNIPAQUE IOHEXOL 300 MG/ML  SOLN  Comparison: None.  Findings: Confluent left upper lobe infiltrate is seen containing a central air bronchograms, consistent with pneumonia.  No evidence of central endobronchial obstruction.  No discrete pulmonary nodules or masses are identified.  The mild mediastinal and bilateral hilar lymphadenopathy is seen which is nonspecific.  There is also mild bilateral axillary lymphadenopathy.  The tiny right-sided pleural effusion is noted, however right lung is otherwise clear.  No evidence of left-sided pleural effusion.  IMPRESSION:  1.  Confluent left upper lobe infiltrate with air bronchograms, consistent with pneumonia. 2.  Mild mediastinal, bilateral hilar, and bilateral axillary lymphadenopathy, which is nonspecific.  Differential diagnosis includes infectious, inflammatory, and neoplastic etiologies. 3.  Tiny right pleural effusion.   Original Report Authenticated By: Earle Gell, M.D.       Assessment/Plan: [name] is a 56 y.o. 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 Is alpha hemolytic likely a viridans but pneumococcus possible  --continue vancomycin until ID of organism made by tomorrow am  # 3 HIV: continue current regimen, farily nicely suppressed VL of 113 likely a "blip" cd4 greater than 300   LOS: 4 days   Alcide Evener 12/23/2011, 2:22 PM

## 2011-12-24 LAB — CBC WITH DIFFERENTIAL/PLATELET
Basophils Absolute: 0 10*3/uL (ref 0.0–0.1)
Basophils Relative: 1 % (ref 0–1)
Eosinophils Absolute: 0.4 10*3/uL (ref 0.0–0.7)
Eosinophils Relative: 9 % — ABNORMAL HIGH (ref 0–5)
HCT: 25.6 % — ABNORMAL LOW (ref 36.0–46.0)
Hemoglobin: 8.5 g/dL — ABNORMAL LOW (ref 12.0–15.0)
Lymphocytes Relative: 23 % (ref 12–46)
Lymphs Abs: 1 10*3/uL (ref 0.7–4.0)
MCH: 35 pg — ABNORMAL HIGH (ref 26.0–34.0)
MCHC: 33.2 g/dL (ref 30.0–36.0)
MCV: 105.3 fL — ABNORMAL HIGH (ref 78.0–100.0)
Monocytes Absolute: 0.4 10*3/uL (ref 0.1–1.0)
Monocytes Relative: 10 % (ref 3–12)
Neutro Abs: 2.6 10*3/uL (ref 1.7–7.7)
Neutrophils Relative %: 57 % (ref 43–77)
Platelets: 236 10*3/uL (ref 150–400)
RBC: 2.43 MIL/uL — ABNORMAL LOW (ref 3.87–5.11)
RDW: 16.5 % — ABNORMAL HIGH (ref 11.5–15.5)
WBC: 4.5 10*3/uL (ref 4.0–10.5)

## 2011-12-24 LAB — OCCULT BLOOD X 1 CARD TO LAB, STOOL: Fecal Occult Bld: NEGATIVE

## 2011-12-24 MED ORDER — AZTREONAM 1 G IJ SOLR
1.0000 g | Freq: Once | INTRAMUSCULAR | Status: DC
  Filled 2011-12-24: qty 1

## 2011-12-24 MED ORDER — VANCOMYCIN HCL 1000 MG IV SOLR: 750.0000 mg | Freq: Once | INTRAVENOUS | Status: DC

## 2011-12-24 MED ORDER — DEXTROSE 5 % IV SOLN
1.0000 g | Freq: Once | INTRAVENOUS | Status: AC
  Administered 2011-12-24: 1 g via INTRAVENOUS
  Filled 2011-12-24: qty 1

## 2011-12-24 MED ORDER — GUAIFENESIN 100 MG/5ML PO SOLN: 10.0000 mL | Freq: Four times a day (QID) | ORAL | Status: DC | PRN

## 2011-12-24 NOTE — Progress Notes (Signed)
[place] for Infectious Disease   Day 6 antibiotics Day # 6aztreonam 3 d daptomycin Received Vancomycin from 12/12 thru 12/14  Subjective: No new complaints feels better   Antibiotics:  Anti-infectives     Start     Dose/Rate Route Frequency Ordered Stop   12/24/11 1330   aztreonam (AZACTAM) injection 1 g  Status:  Discontinued        1 g Intramuscular  Once 12/24/11 1203 12/24/11 1204   12/24/11 1330   aztreonam (AZACTAM) 1 g in dextrose 5 % 50 mL IVPB        1 g 100 mL/hr over 30 Minutes Intravenous  Once 12/24/11 1205 12/24/11 1330   12/23/11 1800   DAPTOmycin (CUBICIN) 380 mg in sodium chloride 0.9 % IVPB  Status:  Discontinued        6 mg/kg  63.3 kg 215.2 mL/hr over 30 Minutes Intravenous Every T-Th-Sa (1800) 12/22/11 1005 12/23/11 1421   12/23/11 1200   vancomycin (VANCOCIN) 500 mg in sodium chloride 0.9 % 100 mL IVPB  Status:  Discontinued        500 mg 100 mL/hr over 60 Minutes Intravenous Every T-Th-Sa (Hemodialysis) 12/22/11 1227 12/23/11 0952   12/23/11 1200   vancomycin (VANCOCIN) 750 mg in sodium chloride 0.9 % 150 mL IVPB        750 mg 150 mL/hr over 60 Minutes Intravenous Every T-Th-Sa (Hemodialysis) 12/23/11 0952     12/22/11 1000   aztreonam (AZACTAM) 1 g in dextrose 5 % 50 mL IVPB        1 g 100 mL/hr over 30 Minutes Intravenous Every 24 hours 12/21/11 0959 12/23/11 1406   12/22/11 1000   stavudine (ZERIT) capsule 20 mg        20 mg Oral Daily 12/21/11 1320     12/21/11 2200   DAPTOmycin (CUBICIN) 380 mg in sodium chloride 0.9 % IVPB  Status:  Discontinued        6 mg/kg  63.3 kg 215.2 mL/hr over 30 Minutes Intravenous Every 48 hours 12/21/11 2110 12/22/11 1005   12/21/11 1030   aztreonam (AZACTAM) 500 mg in dextrose 5 % 50 mL IVPB        500 mg 100 mL/hr over 30 Minutes Intravenous  Once 12/21/11 0959 12/21/11 1249   12/20/11 2200   levofloxacin (LEVAQUIN) IVPB 500 mg  Status:  Discontinued        500 mg 100 mL/hr over 60 Minutes  Intravenous Every 48 hours 12/19/11 0331 12/19/11 1727   12/20/11 1200   vancomycin (VANCOCIN) 500 mg in sodium chloride 0.9 % 100 mL IVPB  Status:  Discontinued        500 mg 100 mL/hr over 60 Minutes Intravenous Every T-Th-Sa (Hemodialysis) 12/19/11 0340 12/19/11 0829   12/20/11 1200   vancomycin (VANCOCIN) 500 mg in sodium chloride 0.9 % 100 mL IVPB  Status:  Discontinued        500 mg 100 mL/hr over 60 Minutes Intravenous Every T-Th-Sa (Hemodialysis) 12/19/11 1244 12/21/11 2053   12/20/11 0300   vancomycin (VANCOCIN) 500 mg in sodium chloride 0.9 % 100 mL IVPB        500 mg 100 mL/hr over 60 Minutes Intravenous  Once 12/20/11 0238 12/20/11 0503   12/19/11 1800   aztreonam (AZACTAM) 500 mg in dextrose 5 % 50 mL IVPB  Status:  Discontinued        500 mg 100 mL/hr over 30 Minutes Intravenous Every 12  hours 12/19/11 0340 12/21/11 0959   12/19/11 1200   Darunavir Ethanolate (PREZISTA) tablet 800 mg        800 mg Oral Daily with breakfast 12/19/11 0829     12/19/11 1200   ritonavir (NORVIR) tablet 100 mg        100 mg Oral Daily with breakfast 12/19/11 0829     12/19/11 1200   DAPTOmycin (CUBICIN) 400 mg in sodium chloride 0.9 % IVPB  Status:  Discontinued        400 mg 216 mL/hr over 30 Minutes Intravenous Every T-Th-Sa (Hemodialysis) 12/19/11 1015 12/19/11 1158   12/19/11 1000   lamiVUDine (EPIVIR) 10 MG/ML solution 50 mg        50 mg Oral Daily 12/19/11 0829     12/19/11 1000   stavudine (ZERIT) capsule 15 mg  Status:  Discontinued        15 mg Oral Daily 12/19/11 0829 12/21/11 1320   12/19/11 0600   aztreonam (AZACTAM) 2 g in dextrose 5 % 50 mL IVPB  Status:  Discontinued        2 g 100 mL/hr over 30 Minutes Intravenous 3 times per day 12/19/11 0328 12/19/11 0339   12/19/11 0345   aztreonam (AZACTAM) 2 g in dextrose 5 % 50 mL IVPB        2 g 100 mL/hr over 30 Minutes Intravenous  Once 12/19/11 0330 12/19/11 0558   12/19/11 0345   levofloxacin (LEVAQUIN) IVPB 750 mg         750 mg 100 mL/hr over 90 Minutes Intravenous  Once 12/19/11 0331 12/19/11 0504   12/19/11 0345   vancomycin (VANCOCIN) IVPB 1000 mg/200 mL premix        1,000 mg 200 mL/hr over 60 Minutes Intravenous  Once 12/19/11 0340 12/19/11 0802   12/19/11 0330   levofloxacin (LEVAQUIN) IVPB 750 mg  Status:  Discontinued        750 mg 100 mL/hr over 90 Minutes Intravenous Every 24 hours 12/19/11 0328 12/19/11 0331          Medications: Scheduled Meds:    . aspirin EC  81 mg Oral Daily  . b complex vitamins  1 tablet Oral Daily  . darbepoetin (ARANESP) injection - DIALYSIS  25 mcg Intravenous Q Sat-HD  . Darunavir Ethanolate  800 mg Oral Q breakfast  . feeding supplement  237 mL Oral BID BM  . heparin  5,000 Units Subcutaneous Q8H  . lamiVUDine  50 mg Oral Daily  . midodrine  10 mg Oral BID AC  . multivitamin  1 tablet Oral Daily  . paricalcitol  4 mcg Intravenous Q T,Th,Sa-HD  . ritonavir  100 mg Oral Q breakfast  . stavudine  20 mg Oral Daily  . vancomycin  750 mg Intravenous Q T,Th,Sa-HD   Continuous Infusions:  PRN Meds:.sodium chloride, sodium chloride, acetaminophen, feeding supplement (NEPRO CARB STEADY), guaiFENesin, heparin, heparin, lidocaine, lidocaine-prilocaine, ondansetron (ZOFRAN) IV, ondansetron, pentafluoroprop-tetrafluoroeth   Objective: Weight change: -1 lb 5.2 oz (-0.6 kg)  Intake/Output Summary (Last 24 hours) at 12/24/11 1338 Last data filed at 12/24/11 0505  Gross per 24 hour  Intake    240 ml  Output      2 ml  Net    238 ml   Blood pressure 106/55, pulse 91, temperature 97.8 F (36.6 C), temperature source Oral, resp. rate 18, height 5\' 4"  (1.626 m), weight 141 lb 1.5 oz (64 kg), SpO2 100.00%. Temp:  [97.8 F (36.6 C)-98.2  F (36.8 C)] 97.8 F (36.6 C) (12/18 1035) Pulse Rate:  [65-91] 91  (12/18 1035) Resp:  [17-18] 18  (12/18 1035) BP: (88-106)/(50-57) 106/55 mmHg (12/18 1035) SpO2:  [96 %-100 %] 100 % (12/18 1035) Weight:  [141 lb 1.5 oz (64  kg)] 141 lb 1.5 oz (64 kg) (12/17 2052)  Physical Exam: General: Alert and awake, oriented x3, not in any acute distress. HEENT: anicteric sclera, pupils reactive to light and accommodation, EOMI CVS regular rate, normal r,  no murmur rubs or gallops Chest:few rhonchi at bases Abdomen: soft nontender, nondistended, normal bowel sounds, Extremities: no  clubbing or edema noted bilaterally Skin: no rashes Lymph: no new lymphadenopathy Neuro: nonfocal  Lab Results:  Basename 12/24/11 0505 12/23/11 0800  WBC 4.5 5.6  HGB 8.5* 8.9*  HCT 25.6* 26.6*  PLT 236 283    BMET  Basename 12/23/11 0800  NA 131*  K 3.4*  CL 92*  CO2 23  GLUCOSE 81  BUN 46*  CREATININE 6.64*  CALCIUM 9.2    Micro Results: Recent Results (from the past 240 hour(s))  CULTURE, BLOOD (ROUTINE X 2)     Status: Normal (Preliminary result)   Collection Time   12/19/11  2:37 AM      Component Value Range Status Comment   Specimen Description BLOOD HAND LEFT   Final    Special Requests BOTTLES DRAWN AEROBIC AND ANAEROBIC 10CC   Final    Culture  Setup Time 12/19/2011 14:26   Final    Culture     Final    Value: GRAM POSITIVE COCCI IN PAIRS IN CHAINS     Note: Gram Stain Report Called to,Read Back By and Verified With: ROSEMARY CLARK 12/21/11 @ 5:55PM BY RUSCA.   Report Status PENDING   Incomplete   CULTURE, BLOOD (ROUTINE X 2)     Status: Normal (Preliminary result)   Collection Time   12/19/11  2:47 AM      Component Value Range Status Comment   Specimen Description BLOOD HAND LEFT   Final    Special Requests BOTTLES DRAWN AEROBIC ONLY 8CC   Final    Culture  Setup Time 12/19/2011 14:26   Final    Culture     Final    Value:        BLOOD CULTURE RECEIVED NO GROWTH TO DATE CULTURE WILL BE HELD FOR 5 DAYS BEFORE ISSUING A FINAL NEGATIVE REPORT   Report Status PENDING   Incomplete   MRSA PCR SCREENING     Status: Normal   Collection Time   12/19/11  8:29 AM      Component Value Range Status Comment     MRSA by PCR NEGATIVE  NEGATIVE Final   CULTURE, BLOOD (ROUTINE X 2)     Status: Normal (Preliminary result)   Collection Time   12/19/11 10:14 PM      Component Value Range Status Comment   Specimen Description BLOOD LEFT HAND   Final    Special Requests BOTTLES DRAWN AEROBIC ONLY 10CC   Final    Culture  Setup Time 12/20/2011 04:38   Final    Culture     Final    Value:        BLOOD CULTURE RECEIVED NO GROWTH TO DATE CULTURE WILL BE HELD FOR 5 DAYS BEFORE ISSUING A FINAL NEGATIVE REPORT   Report Status PENDING   Incomplete   CULTURE, BLOOD (ROUTINE X 2)     Status: Normal (Preliminary result)  Collection Time   12/19/11 10:35 PM      Component Value Range Status Comment   Specimen Description BLOOD LEFT HAND   Final    Special Requests BOTTLES DRAWN AEROBIC AND ANAEROBIC 10CC   Final    Culture  Setup Time 12/20/2011 04:39   Final    Culture     Final    Value:        BLOOD CULTURE RECEIVED NO GROWTH TO DATE CULTURE WILL BE HELD FOR 5 DAYS BEFORE ISSUING A FINAL NEGATIVE REPORT   Report Status PENDING   Incomplete   CLOSTRIDIUM DIFFICILE BY PCR     Status: Normal   Collection Time   12/20/11  9:25 AM      Component Value Range Status Comment   C difficile by pcr NEGATIVE  NEGATIVE Final   STOOL CULTURE     Status: Normal   Collection Time   12/20/11  9:25 AM      Component Value Range Status Comment   Specimen Description STOOL   Final    Special Requests NONE   Final    Culture     Final    Value: NO SALMONELLA, SHIGELLA, CAMPYLOBACTER, YERSINIA, OR E.COLI 0157:H7 ISOLATED   Report Status 12/23/2011 FINAL   Final   OVA AND PARASITE EXAMINATION     Status: Normal   Collection Time   12/20/11  9:25 AM      Component Value Range Status Comment   Specimen Description STOOL   Final    Special Requests NONE   Final    Ova and parasites NO OVA OR PARASITES SEEN   Final    Report Status 12/23/2011 FINAL   Final     Studies/Results: No results  found.    Assessment/Plan: [name] is a 56 y.o. 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 Is alpha hemolytic likely a viridans but pneumococcus possible  --continue vancomycin until ID of organism made, IF pneumococcus give vancomycin for total of 14 days of therpy including therapy received in the hospital  # 3 HIV: continue current regimen, farily nicely suppressed VL of 113 likely a "blip" cd4 greater than 300  FU in [facility] in January   LOS: 5 days   Alcide Evener 12/24/2011, 1:38 PM

## 2011-12-24 NOTE — Discharge Summary (Signed)
Internal Medicine Teaching Service Attending Note Date: 12/24/2011  Patient name: [name]  Medical record number: [id number]  Date of birth: 1955-08-23    I evaluated the patient on the day of discharge and discussed the discharge plan with my resident team. I agree with the discharge documentation and disposition.  Thank you for working with me on this patient's care.   Thanks Madilyn Fireman 12/24/2011, 2:51 PM

## 2011-12-24 NOTE — Clinical Social Work Note (Signed)
Patient medically stable for discharge back to [facility] today. Discharge information forwarded to facility and patient's medical packet completed and will accompany her to facility.  CSW will facilitate transport via ambulance. Patient reports that her family is aware of her d/c back to [facility] today.  Devoiry Corriher Givens, MSW, LCSW [phone]

## 2011-12-24 NOTE — Progress Notes (Signed)
Pt discharging back to [facility]. Assessment unchanged from morning. IV removed. Tele removed. Pt waiting for ambulance transport.

## 2011-12-24 NOTE — Progress Notes (Signed)
Report called to nurse at [facility]

## 2011-12-24 NOTE — Progress Notes (Addendum)
Subjective: Patient denies complaints today.    Objective: Vital signs in last 24 hours: Filed Vitals:   12/23/11 1800 12/23/11 2052 12/24/11 0526 12/24/11 1035  BP: 88/50 103/57 95/52 106/55  Pulse: 80 65 75 91  Temp: 97.8 F (36.6 C) 98.2 F (36.8 C) 98 F (36.7 C) 97.8 F (36.6 C)  TempSrc: Oral Oral Oral   Resp: 18 17 17 18   Height:      Weight:  141 lb 1.5 oz (64 kg)    SpO2: 98% 98% 96% 100%   Weight change: -1 lb 5.2 oz (-0.6 kg)  Intake/Output Summary (Last 24 hours) at 12/24/11 1324 Last data filed at 12/24/11 0505  Gross per 24 hour  Intake    240 ml  Output      2 ml  Net    238 ml   General: lying in bed, nad, alert and oriented x 3 HEENT: [place]/at CV: RRR no rubs, murmurs, or gallops Lungs: ctab Abdomen: soft, ntnd, normal bs Extremities: no cyanosis, warm, left lower extremity with increased size compared with right lower extremity. Also LLE with hyperpigmentation Skin: no evidence of breakdown, intact Neuro: CN 2-12 grossly intact, moving all 4 extremities  Lab Results: Basic Metabolic Panel:  Lab [id number] 0800 12/20/11 1404  NA 131* 133*  K 3.4* 4.2  CL 92* 95*  CO2 23 24  GLUCOSE 81 89  BUN 46* 46*  CREATININE 6.64* 5.91*  CALCIUM 9.2 8.9  MG -- --  PHOS 3.2 4.8*   Liver Function Tests:  Lab 12/23/11 0800 12/20/11 1404 12/20/11 1356 12/19/11 0253  AST -- -- 27 27  ALT -- -- 10 13  ALKPHOS -- -- 124* 126*  BILITOT -- -- 0.9 0.8  PROT -- -- 8.9* 8.6*  ALBUMIN 2.1* 2.2* -- --   CBC:  Lab 12/24/11 0505 12/23/11 0800  WBC 4.5 5.6  NEUTROABS 2.6 3.7  HGB 8.5* 8.9*  HCT 25.6* 26.6*  MCV 105.3* 106.0*  PLT 236 283   Cardiac Enzymes:  Lab 12/22/11 0535 12/19/11 0518  CKTOTAL 245* --  CKMB -- --  CKMBINDEX -- --  TROPONINI -- <0.30   BNP:  Lab 12/19/11 0236  PROBNP AC:[id number]*   Misc. Labs:none  Micro Results: Recent Results (from the past 240 hour(s))  CULTURE, BLOOD (ROUTINE X 2)     Status: Normal (Preliminary result)   Collection Time   12/19/11  2:37 AM      Component Value Range Status Comment   Specimen Description BLOOD HAND LEFT   Final    Special Requests BOTTLES DRAWN AEROBIC AND ANAEROBIC 10CC   Final    Culture  Setup Time 12/19/2011 14:26   Final    Culture     Final    Value: GRAM POSITIVE COCCI IN PAIRS IN CHAINS     Note: Gram Stain Report Called to,Read Back By and Verified With: ROSEMARY CLARK 12/21/11 @ 5:55PM BY RUSCA.   Report Status PENDING   Incomplete   CULTURE, BLOOD (ROUTINE X 2)     Status: Normal (Preliminary result)   Collection Time   12/19/11  2:47 AM      Component Value Range Status Comment   Specimen Description BLOOD HAND LEFT   Final    Special Requests BOTTLES DRAWN AEROBIC ONLY [place]   Final    Culture  Setup Time 12/19/2011 14:26   Final    Culture     Final    Value:  BLOOD CULTURE RECEIVED NO GROWTH TO DATE CULTURE WILL BE HELD FOR 5 DAYS BEFORE ISSUING A FINAL NEGATIVE REPORT   Report Status PENDING   Incomplete   MRSA PCR SCREENING     Status: Normal   Collection Time   12/19/11  8:29 AM      Component Value Range Status Comment   MRSA by PCR NEGATIVE  NEGATIVE Final   CULTURE, BLOOD (ROUTINE X 2)     Status: Normal (Preliminary result)   Collection Time   12/19/11 10:14 PM      Component Value Range Status Comment   Specimen Description BLOOD LEFT HAND   Final    Special Requests BOTTLES DRAWN AEROBIC ONLY 10CC   Final    Culture  Setup Time 12/20/2011 04:38   Final    Culture     Final    Value:        BLOOD CULTURE RECEIVED NO GROWTH TO DATE CULTURE WILL BE HELD FOR 5 DAYS BEFORE ISSUING A FINAL NEGATIVE REPORT   Report Status PENDING   Incomplete   CULTURE, BLOOD (ROUTINE X 2)     Status: Normal (Preliminary result)   Collection Time   12/19/11 10:35 PM      Component Value Range Status Comment   Specimen Description BLOOD LEFT HAND   Final    Special Requests BOTTLES DRAWN AEROBIC AND ANAEROBIC 10CC   Final    Culture  Setup Time 12/20/2011  04:39   Final    Culture     Final    Value:        BLOOD CULTURE RECEIVED NO GROWTH TO DATE CULTURE WILL BE HELD FOR 5 DAYS BEFORE ISSUING A FINAL NEGATIVE REPORT   Report Status PENDING   Incomplete   CLOSTRIDIUM DIFFICILE BY PCR     Status: Normal   Collection Time   12/20/11  9:25 AM      Component Value Range Status Comment   C difficile by pcr NEGATIVE  NEGATIVE Final   STOOL CULTURE     Status: Normal   Collection Time   12/20/11  9:25 AM      Component Value Range Status Comment   Specimen Description STOOL   Final    Special Requests NONE   Final    Culture     Final    Value: NO SALMONELLA, SHIGELLA, CAMPYLOBACTER, YERSINIA, OR E.COLI 0157:H7 ISOLATED   Report Status 12/23/2011 FINAL   Final   OVA AND PARASITE EXAMINATION     Status: Normal   Collection Time   12/20/11  9:25 AM      Component Value Range Status Comment   Specimen Description STOOL   Final    Special Requests NONE   Final    Ova and parasites NO OVA OR PARASITES SEEN   Final    Report Status 12/23/2011 FINAL   Final    Studies/Results: No results found. Medications:  Scheduled Meds:    . aspirin EC  81 mg Oral Daily  . aztreonam  1 g Intravenous Once  . b complex vitamins  1 tablet Oral Daily  . darbepoetin (ARANESP) injection - DIALYSIS  25 mcg Intravenous Q Sat-HD  . Darunavir Ethanolate  800 mg Oral Q breakfast  . feeding supplement  237 mL Oral BID BM  . heparin  5,000 Units Subcutaneous Q8H  . lamiVUDine  50 mg Oral Daily  . midodrine  10 mg Oral BID AC  . multivitamin  1  tablet Oral Daily  . paricalcitol  4 mcg Intravenous Q T,Th,Sa-HD  . ritonavir  100 mg Oral Q breakfast  . stavudine  20 mg Oral Daily  . 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 She presented with dry cough of over one week and chest pain which is now resolved. CXR with Left mid lung infiltrate, febrile with temp of 101.58F, hypotension and WBC of 10.2 (was 4.6 in 10/22/11).   1. HCAP with left upper lobe infiltrate:  -Afebrile x 48 hours  -Continue Aztreonam Day 6.  She will get her last dose today before discharge.   -Spoke with Dr. Tommy Medal he is okay with discharge today and 6 days okay to treat for now - d/c today to [facility]   2. Possible bacteremia -With history of VRE, MSSA will use contact precautions -1/4  blood cultures with gram positive cocci in pairs in chains, pending results but this may be a contaminant. -Spoke with Dr. Tommy Medal he said if this is a contaminant i.e Strep virridans then no need to treat but if other orgramism i.e pneumococcus she will need treatment with Vancomycin x 2 more weeks.  He recommends continuing Vancomycin right now for 1 more dose with HD tomorrow -will f/u blood cultures  -She had continue Vancomycin 3 doses so far.  She has been given Daptomycin 2 doses this admission but Daptomycin was discontinued by ID 12/17.    3. ESRD on HD T/Th/S  -renal following  -Continue midodrine 10 mg qd for intermittent hypotension.   4. Hypotension  -BP 95/52 today.   -monitor BP. Patient runs hypotensive with HD -continue Midodrine 10 mg qd   5. Diarrhea  -contact precautions. Etiology possibly related to antibiotic use this admission.  -stool studies O&P, stool culture negative; C.diff negative -negative fobt, fecal fat normal & lactoferrin positive which may indicate inflammation in the bowel  6. HIV:   -This admission viral quant 114, cd4 ct 160 down from  310 in 10/2011  -Continue norvir, zerit, and prezista, Epivir   7. F/E/N  -diet ordered   Dispo: d/c today   The patient does have a current PCP (GARG, ANKIT, MD), therefore will be requiring OPC follow-up after discharge.  The patient does not have transportation limitations that hinder transportation to clinic appointments.    .Services Needed at time of discharge: Y = Yes, Blank = No PT: Y- at [facility]   OT:   RN:   Equipment:   Other:     LOS: 5 days   Cresenciano Genre [id number] 12/24/2011, 1:24 PM --------------------------------------------------------------------------------------------------------------------------  Internal Medicine Teaching Service Attending Follow up Note Date: 12/24/2011  Patient name: [name]  Medical record number: [id number]  Date of birth: May 30, 1955  I agree with the note above. I met and evaluated the patient today at about 2 pm. She has no new complaints and feels better. She has not spiked new fevers. She does not have any more diarrhea. Labs reviewed. No new BMP today. Her WBC count is trending down.  I would continue  current treatment for HCAP and GPC bacteremia per ID recommendations. Ok to DC if ID is okay with it.   [place], [place] 12/24/2011, 2:40 PM.

## 2011-12-24 NOTE — Discharge Summary (Signed)
[facility] Discharge Note  Name: [name] MRN: [id number] DOB: Feb 14, 1955 56 y.o.  Date of Admission: 12/19/2011  2:12 AM Date of Discharge: 12/24/2011 Attending Physician: Madilyn Fireman, MD  Discharge Diagnosis: 1. *HCAP (healthcare-associated pneumonia) 2. Resolved Chest pain 3. Possible bacteremia  4. ESRD (end stage renal disease) on dialysis Tuesday, Thursday, Saturday 5.  Hypotension, intermittent esp after HD 6. Diarrhea 7. HIV INFECTION  Discharge Medications:   Medication List     As of 12/24/2011  2:42 PM    TAKE these medications         aspirin 81 MG tablet   Take 81 mg by mouth daily.      b complex vitamins tablet   Take 1 tablet by mouth daily.      b complex-vitamin c-folic acid 0.8 MG Tabs   Take 0.8 mg by mouth daily.      Darunavir Ethanolate 800 MG tablet   Commonly known as: PREZISTA   Take 1 tablet (800 mg total) by mouth daily with breakfast.      feeding supplement Liqd   Take 237 mLs by mouth 2 (two) times daily between meals.      feeding supplement (NEPRO CARB STEADY) Liqd   Take 237 mLs by mouth as needed (missed meal during dialysis.).      guaiFENesin 100 MG/5ML Soln   Commonly known as: ROBITUSSIN   Take 10 mLs (200 mg total) by mouth every 6 (six) hours as needed.      lamiVUDine 10 MG/ML solution   Commonly known as: EPIVIR   Take 5 mLs (50 mg total) by mouth daily.      midodrine 10 MG tablet   Commonly known as: PROAMATINE   Take 10 mg by mouth 2 (two) times daily at 10 AM and 5 PM.      oxyCODONE 5 MG immediate release tablet   Commonly known as: Oxy IR/ROXICODONE   Take 5 mg by mouth every 6 (six) hours as needed. For pain      ritonavir 100 MG Tabs   Commonly known as: NORVIR   Take 1 tablet (100 mg total) by mouth daily with breakfast.      sodium chloride 0.9 % SOLN 150 mL with vancomycin 1000 MG SOLR 750 mg   Inject 750 mg into the vein once.   Start taking on: 12/25/2011       stavudine 15 MG capsule   Commonly known as: ZERIT   Take 1 capsule (15 mg total) by mouth daily.         Stop Vancomycin after 12/25/11 dose with dialysis unless otherwise advised.   Disposition and follow-up:   Ms.Joani A Yaldo was discharged from [facility] in stable condition.  At the hospital follow up visit please address  1) Follow up blood cultures  2) Repeat CXR for resolution of pneumonia 3) repeat CBC 4)Physical therapy services at assisted living facility 3X per week  5) Limit dairy consumption  Follow-up Appointments:  Discharge Orders    Future Appointments: Provider: Department: Dept Phone: Center:   01/12/2012 8:45 AM Clinton Gallant, MD [place] [phone] [facility]   01/21/2012 10:30 AM [facility] [phone] [facility]   02/04/2012 10:15 AM Truman Hayward, MD [facility] [phone] [facility]      Consultations: Treatment Team: 1. Clayborne Dana. Posey Pronto, MD-Nephrol 2. ID-Dr. Tommy Medal  Procedures Performed:  Dg Chest 2 View  12/21/2011  *RADIOLOGY REPORT*  Clinical Data: Pneumonia.  CHEST - 2 VIEW  Comparison: 12/19/2011  Findings: Worsening pulmonary consolidation is seen in the left upper lobe.  Right lung remains clear.  No evidence of pleural effusion.  Heart size remains stable.  IMPRESSION: Worsening left upper lobe consolidation.   Original Report Authenticated By: Earle Gell, M.D.    Ct Chest W Contrast  12/21/2011  *RADIOLOGY REPORT*  Clinical Data: Worsening pneumonia.  HIV.  The pancytopenia. Congestive cardiomyopathy.  CT CHEST WITH CONTRAST  Technique:  Multidetector CT imaging of the chest was performed following the standard protocol during bolus administration of intravenous contrast.  Contrast: 54mL OMNIPAQUE IOHEXOL 300 MG/ML  SOLN  Comparison: None.  Findings: Confluent left upper lobe infiltrate is seen containing a central  air bronchograms, consistent with pneumonia.  No evidence of central endobronchial obstruction.  No discrete pulmonary nodules or masses are identified.  The mild mediastinal and bilateral hilar lymphadenopathy is seen which is nonspecific.  There is also mild bilateral axillary lymphadenopathy.  The tiny right-sided pleural effusion is noted, however right lung is otherwise clear.  No evidence of left-sided pleural effusion.  IMPRESSION:  1.  Confluent left upper lobe infiltrate with air bronchograms, consistent with pneumonia. 2.  Mild mediastinal, bilateral hilar, and bilateral axillary lymphadenopathy, which is nonspecific.  Differential diagnosis includes infectious, inflammatory, and neoplastic etiologies. 3.  Tiny right pleural effusion.   Original Report Authenticated By: Earle Gell, M.D.    Dg Chest Port 1 View  12/19/2011  *RADIOLOGY REPORT*  Clinical Data: Chest pain for 2 days.  Fever.  Diaphoresis.  PORTABLE CHEST - 1 VIEW  Comparison: 07/11/2011  Findings: Shallow inspiration.  Cardiac enlargement with normal pulmonary vascularity.  Left perihilar airspace infiltration suggesting pneumonia.  No blunting of costophrenic angles.  No pneumothorax.  Surgical clips in the left axilla.  IMPRESSION: Cardiac enlargement.  Airspace infiltration in the left mid lung suggesting pneumonia.   Original Report Authenticated By: Lucienne Capers, M.D.     Admission HPI:  Chief Complaint: chest pain & cough  History of Present Illness:  Ms. 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 The chest pain is described as midchest, substernal, sharp, 10/10 in severity, it  does not radiated, lasting for hours and present only with her cough. She had Nitro 3 times with no relief of her chest pain. Her chest pain is currently gone as she is no longer coughing. Her cough is described as nonproductive, intermittent, and has been present for more than one week. She reports that her roommate at SNF also has a cough. She had her flu shot on 10/22/11. Per her SNF notes she also had facial puffiness but the patient complains that she was not given a mirror and does not know if her face was indeed swollen. At the SNF she was found to be febrile with temp of 103.67F, she was given Tylenol and transported to the [facility] ED .  She denies dizziness, headache, SOB, N/V, abdominal pain, diarrhea or constipation. She has had fever and chills.  She is anuric. At  her baseline she "walks" with a wheelchair.    Review of Systems:  Constitutional: +fever, +chills, +diaphoresis, appetite change and fatigue.  HEENT: Denies photophobia, eye pain, redness, hearing loss, ear pain, congestion, sore throat, rhinorrhea, sneezing, mouth sores, trouble swallowing, neck pain, neck stiffness and tinnitus.  Respiratory: Denies SOB, DOE, +cough, chest tightness, and wheezing.  Cardiovascular: +chest pain, palpitations and leg swelling.  Gastrointestinal: Denies nausea, vomiting, abdominal pain, diarrhea, constipation, blood in stool and abdominal distention.  Genitourinary: Denies dysuria, urgency, frequency, hematuria, flank pain and difficulty urinating.  Musculoskeletal: Denies myalgias, back pain, joint swelling, arthralgias and gait problem.  Skin: Denies pallor, rash and wound.  Neurological: Denies dizziness, seizures, syncope, weakness, light-headedness, numbness and headaches.  Hematological: Denies adenopathy. Easy bruising, personal or family bleeding history  Psychiatric/Behavioral: Denies suicidal ideation, mood changes, confusion, nervousness, sleep disturbance and agitation   Physical Exam:   Blood pressure 87/46, pulse 82, temperature 101.2 F (38.4 C), temperature source Oral, resp. rate 12, SpO2 100.00%.  General: pleasant, alert, and cooperative to examination, appears much older than stated age. Facial puffiness but baseline unclear. No lip swelling.  Head: normocephalic and atraumatic.  Eyes: vision grossly intact, pupils equal, pupils round, pupils reactive to light, no injection and anicteric.  Mouth: pharynx pink and moist, no erythema, and no exudates.  Neck: supple, full ROM, no thyromegaly, no JVD, and no carotid bruits.  Lungs: expiratory and inspiratory rhonchi throughout, normal respiratory effort, no accessory muscle use, no crackles, and no wheezes. Heart: normal rate, regular rhythm, 2/6 early systolic murmur heard best at the LLSB radiating to the apex, no gallop, and no rub.  Abdomen: soft, non-tender, normal bowel sounds, no distention, no guarding, no rebound tenderness, no hepatomegaly, and no splenomegaly.  Msk: no joint swelling, no joint warmth, and no redness over joints.  Pulses: 2+ DP/PT pulses bilaterally Extremities: Right arm AVG with palpable thrill and noticeable bruit with no surrounding edema or erythema. No cyanosis, clubbing, edema. Toe nails long.  Neurologic: alert & oriented X3, cranial nerves II-XII intact, strength normal in all extremities, sensation intact to light touch  Skin: turgor normal and no rashes.  Psych: Appears a little confused which is her baseline per SNF records, normally interactive, good eye contact, not anxious appearing, and not depressed appearing.      Hospital Course by problem list: 1. *HCAP (healthcare-associated pneumonia) 2. Resolved Chest pain 3. Possible bacteremia  4. ESRD (end stage renal disease) on dialysis Tuesday, Thursday, Saturday 5.  Hypotension, intermittent esp after HD 6. Diarrhea 7. HIV INFECTION  1. *HCAP (healthcare-associated pneumonia) This is HCAP as this patient is on dialysis and  lives in a nursing home where she stated her roommate had been coughing.  She had fever prior to admission and this admission with a maximum temperature of 105.8 rectally.  She has intermittently spiked fevers this admission likely related to HCAP.  She has been afebrile x 48 hours prior to discharge.  CXR showed upper to mid pneumonia.  Due to spiking fevers this admission we did a CT chest with confluent left upper lobe infiltrate with air bronchograms pneumonia. Mild mediastinal bilateral hilar, bilateral axillary lymphadenopathy nonspecific. Infectious, inflammatory, neoplastic possible etiologies. Tiny right pleural effusion.  She was initially started on Levaquin (given 1 dose this admission then discontinued), Aztreonam (for 6 doses), Daptomycin.  She was started on Daptomycin due to her history of Vancomycin Resistant Enterococcus.  Daptomycin does not penetrate the lungs so she was switched to Vancomycin (  for 3 doses) this admission.  She had a negative influenza panel.  She does not make urine so we were not able to obtain Legionella or Strep pneumonia.  Blood cultures with 1/4 showing gram positive cocci in pairs in chains with pending results.  Her lactic acid level is not elevated, 0.9 this admission.  She was given Albuterol nebulizer initially on admission which was discontinued.  She will need a repeat CXR to make sure pneumonia is resolving in at least 6 weeks.   2. Chest pain Resolved on day 1 of admission possibly pleuritic due to coughing with pneumonia. Troponin I negative x 1. BNP elevated (29137.0) in the setting of history of history of cardiomyopathy, female sex and ESRD.  3. Possible bacteremia  With history of Vancomycin Resistant Enterococcus, MSSA bacteremia.  We used contact precautions this admission. 1 of 4 blood cultures showing gram positive cocci in pairs in chains, pending results of species but this may be a contaminant.  Infectious disease was consulted, Dr. Tommy Medal who  stated this may be a contaminant i.e Strep virridans.  If it is then there is no need to treat but if another orgramism i.e pneumococcus she will need treatment with Vancomycin x 2 weeks total duration. He recommended upon discharge giving her one more dose of Vancomycin 12/25/11 with dialysis until further results with blood cultures.  She has had  Vancomycin 3 doses so far and Daptomycin 2 doses this admission but Daptomycin was discontinued by Dr. Lucianne Lei Dam12/17.    4. ESRD (end stage renal disease) on dialysis Tuesday, Thursday, Saturday She received dialysis this admission via guidance of [facility].  She goes to [place] dialysis site.  Her Nephrologist is Dr. Moshe Cipro. Her pro-BNP is 29,137 but in the context of ESRD. Her AVGraft has a good thrill and bruit. She has history of transient low blood pressures after dialysis and is on midodrine at home.   5.  Hypotension, intermittent esp after HD She has intermittent asymptomatic hypotension especially after dialysis.  She did have one low blood pressure this admission of 54/42.  We continued Midodrine.   6. Diarrhea We placed her on contact precautions. Etiology possibly related to antibiotic use this admission.  Stool studies i.e ova and parasites, stool culture, C.difficile were.  She had a negative fobt.  Fecal fat was normal.  She was lactoferrin positive which may indicate inflammation in the bowel.  We placed an order to limit her dairy.   7. HIV INFECTION This admission her viral quantitative value was 114, cd 4 count was 160 down from 310 in 10/2011.  We continued Norvir, Zerit, Prezista, and Epivir this admission.    She had heparin for deep venous thrombosis prophylaxis.    Discharge Vitals:  BP 106/55  Pulse 91  Temp 97.8 F (36.6 C) (Oral)  Resp 18  Ht 5\' 4"  (1.626 m)  Wt 141 lb 1.5 oz (64 kg)  BMI 24.22 kg/m2  SpO2 100%  Discharge Physical Exam:  General: lying in bed, nad, alert and oriented x 3   HEENT: [place]/at  CV: RRR no rubs, murmurs, or gallops  Lungs: ctab  Abdomen: soft, ntnd, normal bs  Extremities: no cyanosis, warm, left lower extremity with increased size compared with right lower extremity. Also LLE with hyperpigmentation  Skin: no evidence of breakdown, intact  Neuro: CN 2-12 grossly intact, moving all 4 extremities  Discharge Labs:  Results for AREYANA, BIGLEY (MRN [id number]) as of 12/24/2011 13:23  Ref. Range 12/19/2011 02:53 12/20/2011 13:56 12/20/2011 14:04 12/23/2011 08:00  Sodium Latest Range: 135-145 mEq/L 132 (L) 131 (L) 133 (L) 131 (L)  Potassium Latest Range: 3.5-5.1 mEq/L 4.7 4.2 4.2 3.4 (L)  Chloride Latest Range: 96-112 mEq/L 93 (L) 94 (L) 95 (L) 92 (L)  CO2 Latest Range: 19-32 mEq/L 20 25 24 23   BUN Latest Range: 6-23 mg/dL 79 (H) 46 (H) 46 (H) 46 (H)  Creatinine Latest Range: 0.50-1.10 mg/dL 8.79 (H) 5.87 (H) 5.91 (H) 6.64 (H)  Calcium Latest Range: 8.4-10.5 mg/dL 8.5 8.7 8.9 9.2  GFR calc non Af Amer Latest Range: >90 mL/min 4 (L) 7 (L) 7 (L) 6 (L)  GFR calc Af Amer Latest Range: >90 mL/min 5 (L) 8 (L) 8 (L) 7 (L)  Glucose Latest Range: 70-99 mg/dL 86 93 89 81  Phosphorus Latest Range: 2.3-4.6 mg/dL   4.8 (H) 3.2  Alkaline Phosphatase Latest Range: 39-117 U/L 126 (H) 124 (H)    Albumin Latest Range: 3.5-5.2 g/dL 2.2 (L) 2.2 (L) 2.2 (L) 2.1 (L)  AST Latest Range: 0-37 U/L 27 27    ALT Latest Range: 0-35 U/L 13 10    Total Protein Latest Range: 6.0-8.3 g/dL 8.6 (H) 8.9 (H)    Total Bilirubin Latest Range: 0.3-1.2 mg/dL 0.8 0.9    Results for QUETZALI, KREILING (MRN [id number]) as of 12/24/2011 13:23  Ref. Range 12/19/2011 02:36 12/19/2011 05:18 12/22/2011 05:35  CK Total Latest Range: 7-177 U/L   245 (H)  Troponin I Latest Range: <0.30 ng/mL  <0.30   Pro B Natriuretic peptide (BNP) Latest Range: 0-125 pg/mL 29137.0 (H)    Results for PERLITA, KOHLENBERG (MRN [id number]) as of 12/24/2011 13:23  Ref. Range 12/19/2011 03:53 12/20/2011 14:04  Lactic Acid, Venous  Latest Range: 0.5-2.2 mmol/L 0.9 1.7  Results for LEQUISHA, DONCHEZ (MRN [id number]) as of 12/24/2011 13:23  Ref. Range 12/20/2011 13:56 12/21/2011 08:10 12/22/2011 09:41 12/23/2011 08:00 12/24/2011 05:05  WBC Latest Range: 4.0-10.5 K/uL 9.3 10.8 (H) 9.3 5.6 4.5  RBC Latest Range: 3.87-5.11 MIL/uL 2.87 (L) 2.67 (L) 2.69 (L) 2.51 (L) 2.43 (L)  Hemoglobin Latest Range: 12.0-15.0 g/dL 10.4 (L) 9.6 (L) 9.7 (L) 8.9 (L) 8.5 (L)  HCT Latest Range: 36.0-46.0 % 30.4 (L) 28.4 (L) 28.6 (L) 26.6 (L) 25.6 (L)  MCV Latest Range: 78.0-100.0 fL 105.9 (H) 106.4 (H) 106.3 (H) 106.0 (H) 105.3 (H)  MCH Latest Range: 26.0-34.0 pg 36.2 (H) 36.0 (H) 36.1 (H) 35.5 (H) 35.0 (H)  MCHC Latest Range: 30.0-36.0 g/dL 34.2 33.8 33.9 33.5 33.2  RDW Latest Range: 11.5-15.5 % 15.7 (H) 16.1 (H) 16.3 (H) 16.6 (H) 16.5 (H)  Platelets Latest Range: 150-400 K/uL 223 216 264 283 236  Neutrophils Relative Latest Range: 43-77 % 78 (H) 76 80 (H) 66 57  Lymphocytes Relative Latest Range: 12-46 % 8 (L) 9 (L) 10 (L) 20 23  Monocytes Relative Latest Range: 3-12 % 14 (H) 14 (H) 5 4 10   Eosinophils Relative Latest Range: 0-5 % 0 0 5 9 (H) 9 (H)  Basophils Relative Latest Range: 0-1 % 0 0 0 1 1  NEUT# Latest Range: 1.7-7.7 K/uL 7.2 8.2 (H) 7.4 3.7 2.6  Lymphocytes Absolute Latest Range: 0.7-4.0 K/uL 0.8 1.0 0.9 1.1 1.0  Monocytes Absolute Latest Range: 0.1-1.0 K/uL 1.3 (H) 1.5 (H) 0.5 0.2 0.4  Eosinophils Absolute Latest Range: 0.0-0.7 K/uL 0.0 0.0 0.5 0.5 0.4  Basophils Absolute Latest Range: 0.0-0.1 K/uL 0.0 0.0 0.0 0.1 0.0  Results for Hammes,  Victoriana A (MRN [id number]) as of 12/24/2011 13:23  Ref. Range 12/19/2011 02:53 12/20/2011 13:56 12/21/2011 08:10  WBC Latest Range: 4.0-10.5 K/uL 10.2 9.3 10.8 (H)  RBC Latest Range: 3.87-5.11 MIL/uL 2.91 (L) 2.87 (L) 2.67 (L)  Hemoglobin Latest Range: 12.0-15.0 g/dL 10.6 (L) 10.4 (L) 9.6 (L)  HCT Latest Range: 36.0-46.0 % 30.8 (L) 30.4 (L) 28.4 (L)  MCV Latest Range: 78.0-100.0 fL 105.8 (H) 105.9 (H)  106.4 (H)  MCH Latest Range: 26.0-34.0 pg 36.4 (H) 36.2 (H) 36.0 (H)  MCHC Latest Range: 30.0-36.0 g/dL 34.4 34.2 33.8  RDW Latest Range: 11.5-15.5 % 15.6 (H) 15.7 (H) 16.1 (H)  Platelets Latest Range: 150-400 K/uL 197 223 216  Neutrophils Relative Latest Range: 43-77 % 79 (H) 78 (H) 76  Lymphocytes Relative Latest Range: 12-46 % 11 (L) 8 (L) 9 (L)  Monocytes Relative Latest Range: 3-12 % 9 14 (H) 14 (H)  Eosinophils Relative Latest Range: 0-5 % 0 0 0  Basophils Relative Latest Range: 0-1 % 0 0 0  NEUT# Latest Range: 1.7-7.7 K/uL 8.0 (H) 7.2 8.2 (H)  Lymphocytes Absolute Latest Range: 0.7-4.0 K/uL 1.2 0.8 1.0  Monocytes Absolute Latest Range: 0.1-1.0 K/uL 1.0 1.3 (H) 1.5 (H)  Eosinophils Absolute Latest Range: 0.0-0.7 K/uL 0.0 0.0 0.0  Basophils Absolute Latest Range: 0.0-0.1 K/uL 0.0 0.0 0.0   Results for ALFHILD, FEINER (MRN [id number]) as of 12/24/2011 13:23  Ref. Range 12/20/2011 14:05  CD4 T Cell Abs Latest Range: [phone] cmm 160 (L)  CD4 % Helper T Cell Latest Range: 33-55 % 15 (L)   Results for ARDES, GALBAN A (MRN [id number]) as of 12/24/2011 13:23  Ref. Range 12/19/2011 13:45 12/19/2011 14:25 12/20/2011 14:05  Influenza A By PCR Latest Range: NEGATIVE   NEGATIVE   Influenza B By PCR Latest Range: NEGATIVE   NEGATIVE   H1N1 flu by pcr Latest Range: NOT DETECTED   NOT DETECTED   HIV 1 RNA Quant Latest Range: <20 copies/mL 114 (H)  169 (H)  HIV1 RNA Quant, Log Latest Range: <1.30 log 10 2.06 (H)  2.23 (H)   Results for SARANG, HUNSAKER (MRN [id number]) as of 12/24/2011 13:23  Ref. Range 12/21/2011 10:11  Free fatty acids No range found NORMAL  Neutral Fat No range found NORMAL  Specimen Source-FFATST No range found STOOL  Fecal Lactoferrin No range found POSITIVE   Results for STAMATIA, WISSE (MRN [id number]) as of 12/24/2011 13:23  Ref. Range 12/20/2011 09:25  Ova and parasites No range found NO OVA OR PARASITES SEEN   Results for MAISEN, REDLER (MRN [id number]) as of 12/24/2011  13:23  Ref. Range 12/20/2011 09:25  C difficile by pcr Latest Range: NEGATIVE  NEGATIVE   Results for TAMEKIA, POTOSKY (MRN [id number]) as of 12/24/2011 13:23  Ref. Range 12/19/2011 08:29  MRSA by PCR Latest Range: NEGATIVE  NEGATIVE         Results for orders placed during the hospital encounter of 12/19/11 (from the past 24 hour(s))  OCCULT BLOOD X 1 CARD TO LAB, STOOL     Status: Normal   Collection Time   12/24/11  4:48 AM      Component Value Range   Fecal Occult Bld NEGATIVE  NEGATIVE  CBC WITH DIFFERENTIAL     Status: Abnormal   Collection Time   12/24/11  5:05 AM      Component Value Range   WBC 4.5  4.0 - 10.5 K/uL   RBC 2.43 (*) 3.87 - 5.11  MIL/uL   Hemoglobin 8.5 (*) 12.0 - 15.0 g/dL   HCT 25.6 (*) 36.0 - 46.0 %   MCV 105.3 (*) 78.0 - 100.0 fL   MCH 35.0 (*) 26.0 - 34.0 pg   MCHC 33.2  30.0 - 36.0 g/dL   RDW 16.5 (*) 11.5 - 15.5 %   Platelets 236  150 - 400 K/uL   Neutrophils Relative 57  43 - 77 %   Neutro Abs 2.6  1.7 - 7.7 K/uL   Lymphocytes Relative 23  12 - 46 %   Lymphs Abs 1.0  0.7 - 4.0 K/uL   Monocytes Relative 10  3 - 12 %   Monocytes Absolute 0.4  0.1 - 1.0 K/uL   Eosinophils Relative 9 (*) 0 - 5 %   Eosinophils Absolute 0.4  0.0 - 0.7 K/uL   Basophils Relative 1  0 - 1 %   Basophils Absolute 0.0  0.0 - 0.1 K/uL    Signed: Cresenciano Genre 12/24/2011, 2:42 PM   Time Spent on Discharge: 30 minutes  Services Ordered on Discharge: PT at [facility] on Discharge: none

## 2011-12-24 NOTE — Progress Notes (Signed)
Subjective:   No complaints, breathing well, but occasional cough.  Objective: Vital signs in last 24 hours: Temp:  [97.8 F (36.6 C)-98.3 F (36.8 C)] 98 F (36.7 C) (12/18 0526) Pulse Rate:  [65-80] 75  (12/18 0526) Resp:  [17-22] 17  (12/18 0526) BP: (70-103)/(45-57) 95/52 mmHg (12/18 0526) SpO2:  [96 %-98 %] 96 % (12/18 0526) Weight:  [64 kg (141 lb 1.5 oz)] 64 kg (141 lb 1.5 oz) (12/17 2052) Weight change: -0.6 kg (-1 lb 5.2 oz)  Intake/Output from previous day: 12/17 0701 - 12/18 0700 In: 600 [P.O.:600] Out: 2169 [Stool:2]   EXAM: General appearance:  Alert, in no apparent distress Resp:  CTA without rales, rhonchi, or wheezes Cardio:  RRR without murmur or rub GI:  + BS, soft and nontender Extremities:  No edema Access:  AVG @ RUA with + bruit  Lab Results:  Basename 12/24/11 0505 12/23/11 0800  WBC 4.5 5.6  HGB 8.5* 8.9*  HCT 25.6* 26.6*  PLT 236 283   BMET:  Basename 12/23/11 0800  NA 131*  K 3.4*  CL 92*  CO2 23  GLUCOSE 81  BUN 46*  CREATININE 6.64*  CALCIUM 9.2  ALBUMIN 2.1*   No results found for this basename: PTH:2 in the last 72 hours Iron Studies: No results found for this basename: IRON,TIBC,TRANSFERRIN,FERRITIN in the last 72 hours  Dialysis Orders: Center: [facility] on TTS.  EDW 61 kg HD Bath 2K/2.25Ca Time 4 hrs Heparin 5900 U. Access AVG @ RUA BFR 400 DFR 800  Zemplar 4 mcg IV/HD Epogen 13,400 Units IV/HD Venofer 50 mg on Thurs  Assessment/Plan: 1. Fever/cough/chest pain - now afebrile, believed to be secondary to left pneumonia per chest x-ray; right IJ catheter removed 10/14, but Hx VRE bacteremia (most recently in 08/2011); C diff and stool culture 12/14 negative; 1 of 4 blood cultures 12/13 with Gr + cocci; finished IV Aztreonam, remains on Vancomycin per ID.  2. ESRD - HD on TTS @ [place]; K 3.4 yesterday.  3. Hypotension/volume - BP 95/52 on Midodrine 10 mg bid; wt 64 kg s/p net UF 2167 ml yesterday  (EDW 61 kg).  4. Anemia - Hgb down to  8.5, on outpatient Epogen 13,400 U and IV Fe (Thurs), but last T-sat 44%; Aranesp 25 mcg on Sat.  5. Metabolic bone disease - Ca 9.2 (10.7 corrected), P 3.2, last iPTH 163 on 10/24; on Zemplar 4 mcg, Phoslo 1 with meals.  6. Nutrition - Alb 2.1, high protein renal diet.  7. HIV - on antiviral meds per ID, last CD4 310 and viral load 367 in 10/2011.    LOS: 5 days   LYLES,CHARLES 12/24/2011,9:00 AM  Patient seen and examined and agree with assessment and plan as above.  Kelly Splinter  MD [facility] [phone] pgr    [phone] cell 12/24/2011, 10:57 AM

## 2011-12-25 LAB — CULTURE, BLOOD (ROUTINE X 2): Culture: NO GROWTH

## 2011-12-26 ENCOUNTER — Telehealth: Payer: Self-pay | Admitting: *Deleted

## 2011-12-26 ENCOUNTER — Telehealth: Payer: Self-pay | Admitting: Internal Medicine

## 2011-12-26 LAB — CULTURE, BLOOD (ROUTINE X 2)
Culture: NO GROWTH
Culture: NO GROWTH

## 2011-12-26 LAB — HLA B*5701: HLA B 5701: NEGATIVE

## 2011-12-26 NOTE — Telephone Encounter (Signed)
Received call from [facility]  lab reporting  Blood Culture report from 12/13   Growth of  VRE  Reported to Dr Aundra Dubin, she was aware of this.

## 2011-12-26 NOTE — Telephone Encounter (Signed)
Spoke to Dr. Tommy Medal 1/4 cultures VRE.  He stated he would not treat this, this could be a contaminant and will not treat.  If the patient is sick in the future consider this as etiology.   Aundra Dubin MD

## 2012-01-12 ENCOUNTER — Encounter: Payer: Medicaid Other | Admitting: Internal Medicine

## 2012-01-21 ENCOUNTER — Other Ambulatory Visit: Payer: Medicaid Other

## 2012-01-29 ENCOUNTER — Encounter: Payer: Self-pay | Admitting: *Deleted

## 2012-02-04 ENCOUNTER — Encounter: Payer: Self-pay | Admitting: Infectious Disease

## 2012-02-04 ENCOUNTER — Ambulatory Visit (INDEPENDENT_AMBULATORY_CARE_PROVIDER_SITE_OTHER): Payer: Medicaid Other | Admitting: Infectious Disease

## 2012-02-04 VITALS — BP 135/83 | HR 91 | Temp 98.8°F | Wt 158.0 lb

## 2012-02-04 DIAGNOSIS — J189 Pneumonia, unspecified organism: Secondary | ICD-10-CM

## 2012-02-04 DIAGNOSIS — N186 End stage renal disease: Secondary | ICD-10-CM

## 2012-02-04 DIAGNOSIS — B20 Human immunodeficiency virus [HIV] disease: Secondary | ICD-10-CM

## 2012-02-04 MED ORDER — ABACAVIR SULFATE 300 MG PO TABS: 600.0000 mg | ORAL_TABLET | Freq: Every day | ORAL | Status: DC

## 2012-02-04 NOTE — Progress Notes (Signed)
  Subjective:    Patient ID: [name], female    DOB: October 14, 1955, 57 y.o.   MRN: [id number]  HPI  57 y.o. 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 She had low level viremia and CD4 dropped slightly the likely due to her acute illness. She returns to clinic today for followup. Her pneumonia has resolved she is without fevers without cough without malaise. She has not had recurrence of her C. Difficile colitis. I would change her antiretroviral regimen slightly to substitute Ziagen for Zerit.   Review of Systems  Constitutional: Negative for fever, chills, diaphoresis, activity change, appetite change, fatigue and unexpected weight change.  HENT: Negative for congestion, sore throat, rhinorrhea, sneezing, trouble swallowing and sinus pressure.   Eyes: Negative for photophobia and visual disturbance.  Respiratory: Negative for cough, chest tightness, shortness of breath, wheezing and stridor.   Cardiovascular: Negative for chest pain, palpitations and leg swelling.  Gastrointestinal: Negative for nausea, vomiting, abdominal pain, diarrhea, constipation, blood in stool, abdominal distention and anal bleeding.  Genitourinary: Negative for dysuria, hematuria, flank pain and difficulty urinating.  Musculoskeletal: Negative for myalgias, back pain, joint swelling, arthralgias and gait problem.  Skin: Negative for color change, pallor, rash and wound.  Neurological: Negative for dizziness, tremors, weakness and light-headedness.  Hematological: Negative for adenopathy. Does not bruise/bleed easily.  Psychiatric/Behavioral: Negative for behavioral problems, confusion, sleep disturbance, dysphoric mood, decreased concentration and agitation.       Objective:   Physical Exam  Constitutional: She is oriented to person, place, and time. No distress.   HENT:  Head: Normocephalic and atraumatic.  Mouth/Throat: Oropharynx is clear and moist.  Eyes: Conjunctivae normal and EOM are normal.  Neck: Normal range of motion. Neck supple.  Cardiovascular: Normal rate, regular rhythm and normal heart sounds.  Exam reveals no gallop and no friction rub.   No murmur heard. Pulmonary/Chest: Effort normal and breath sounds normal. No respiratory distress. She has no wheezes. She has no rales. She exhibits no tenderness.  Abdominal: She exhibits no distension. There is no tenderness.  Musculoskeletal: She exhibits no edema and no tenderness.  Neurological: She is alert and oriented to person, place, and time. She exhibits normal muscle tone. Coordination normal.  Skin: Skin is warm and dry. She is not diaphoretic. No erythema. No pallor.     Psychiatric: She has a normal mood and affect. Her behavior is normal. Judgment and thought content normal.          Assessment & Plan:  HIV: Will continue Prezista Norvir Epivir and start Ziagen and discontinued the Zerit we'll recheck her labs today and in 2 months time.  Healthcare associated pneumonia: Resolved.  End-stage renal disease continue hemodialysis with fistula appears to be functioning well.

## 2012-02-05 LAB — T-HELPER CELL (CD4) - (RCID CLINIC ONLY)
CD4 % Helper T Cell: 22 % — ABNORMAL LOW (ref 33–55)
CD4 T Cell Abs: 340 uL — ABNORMAL LOW (ref 400–2700)

## 2012-02-05 LAB — HIV-1 RNA QUANT-NO REFLEX-BLD
HIV 1 RNA Quant: <20 copies/mL (ref <20)
HIV-1 RNA Quant, Log: <1.3 {Log} (ref <1.30)

## 2012-02-11 ENCOUNTER — Encounter (HOSPITAL_COMMUNITY): Payer: Self-pay | Admitting: *Deleted

## 2012-02-11 ENCOUNTER — Inpatient Hospital Stay (HOSPITAL_COMMUNITY)
Admission: EM | Admit: 2012-02-11 | Discharge: 2012-02-17 | DRG: 969 | Disposition: A | Discharge status: Skilled Nursing Facility | Payer: Medicaid Other | Attending: Infectious Disease | Admitting: Infectious Disease

## 2012-02-11 DIAGNOSIS — I5020 Unspecified systolic (congestive) heart failure: Secondary | ICD-10-CM | POA: Diagnosis present

## 2012-02-11 DIAGNOSIS — Z841 Family history of disorders of kidney and ureter: Secondary | ICD-10-CM

## 2012-02-11 DIAGNOSIS — B20 Human immunodeficiency virus [HIV] disease: Secondary | ICD-10-CM | POA: Diagnosis present

## 2012-02-11 DIAGNOSIS — A4102 Sepsis due to Methicillin resistant Staphylococcus aureus: Secondary | ICD-10-CM | POA: Diagnosis present

## 2012-02-11 DIAGNOSIS — I120 Hypertensive chronic kidney disease with stage 5 chronic kidney disease or end stage renal disease: Secondary | ICD-10-CM | POA: Diagnosis present

## 2012-02-11 DIAGNOSIS — A0472 Enterocolitis due to Clostridium difficile, not specified as recurrent: Secondary | ICD-10-CM

## 2012-02-11 DIAGNOSIS — I509 Heart failure, unspecified: Secondary | ICD-10-CM | POA: Diagnosis present

## 2012-02-11 DIAGNOSIS — J189 Pneumonia, unspecified organism: Secondary | ICD-10-CM | POA: Diagnosis present

## 2012-02-11 DIAGNOSIS — Z992 Dependence on renal dialysis: Secondary | ICD-10-CM

## 2012-02-11 DIAGNOSIS — I9589 Other hypotension: Secondary | ICD-10-CM | POA: Diagnosis present

## 2012-02-11 DIAGNOSIS — D638 Anemia in other chronic diseases classified elsewhere: Secondary | ICD-10-CM

## 2012-02-11 DIAGNOSIS — R9431 Abnormal electrocardiogram [ECG] [EKG]: Secondary | ICD-10-CM

## 2012-02-11 DIAGNOSIS — I519 Heart disease, unspecified: Secondary | ICD-10-CM

## 2012-02-11 DIAGNOSIS — I429 Cardiomyopathy, unspecified: Secondary | ICD-10-CM | POA: Diagnosis present

## 2012-02-11 DIAGNOSIS — N2581 Secondary hyperparathyroidism of renal origin: Secondary | ICD-10-CM | POA: Diagnosis present

## 2012-02-11 DIAGNOSIS — E46 Unspecified protein-calorie malnutrition: Secondary | ICD-10-CM | POA: Diagnosis present

## 2012-02-11 DIAGNOSIS — R001 Bradycardia, unspecified: Secondary | ICD-10-CM

## 2012-02-11 DIAGNOSIS — E2749 Other adrenocortical insufficiency: Secondary | ICD-10-CM | POA: Diagnosis present

## 2012-02-11 DIAGNOSIS — Z859 Personal history of malignant neoplasm, unspecified: Secondary | ICD-10-CM

## 2012-02-11 DIAGNOSIS — R7881 Bacteremia: Secondary | ICD-10-CM

## 2012-02-11 DIAGNOSIS — R918 Other nonspecific abnormal finding of lung field: Secondary | ICD-10-CM | POA: Diagnosis present

## 2012-02-11 DIAGNOSIS — I959 Hypotension, unspecified: Secondary | ICD-10-CM

## 2012-02-11 DIAGNOSIS — Z79899 Other long term (current) drug therapy: Secondary | ICD-10-CM

## 2012-02-11 DIAGNOSIS — R011 Cardiac murmur, unspecified: Secondary | ICD-10-CM | POA: Diagnosis present

## 2012-02-11 DIAGNOSIS — N186 End stage renal disease: Secondary | ICD-10-CM | POA: Diagnosis present

## 2012-02-11 DIAGNOSIS — Z7982 Long term (current) use of aspirin: Secondary | ICD-10-CM

## 2012-02-11 DIAGNOSIS — A419 Sepsis, unspecified organism: Secondary | ICD-10-CM | POA: Diagnosis present

## 2012-02-11 DIAGNOSIS — I428 Other cardiomyopathies: Secondary | ICD-10-CM | POA: Diagnosis present

## 2012-02-11 DIAGNOSIS — D631 Anemia in chronic kidney disease: Secondary | ICD-10-CM | POA: Diagnosis present

## 2012-02-11 DIAGNOSIS — I2589 Other forms of chronic ischemic heart disease: Secondary | ICD-10-CM | POA: Diagnosis present

## 2012-02-11 DIAGNOSIS — IMO0002 Reserved for concepts with insufficient information to code with codable children: Secondary | ICD-10-CM

## 2012-02-11 DIAGNOSIS — I38 Endocarditis, valve unspecified: Secondary | ICD-10-CM

## 2012-02-11 HISTORY — DX: Malignant (primary) neoplasm, unspecified: C80.1

## 2012-02-11 MED ORDER — LACTATED RINGERS IV BOLUS (SEPSIS)
1000.0000 mL | Freq: Once | INTRAVENOUS | Status: AC
  Administered 2012-02-11: 1000 mL via INTRAVENOUS

## 2012-02-11 MED ORDER — VANCOMYCIN HCL IN DEXTROSE 1-5 GM/200ML-% IV SOLN: 1000.0000 mg | Freq: Once | INTRAVENOUS | Status: DC

## 2012-02-11 MED ORDER — ACETAMINOPHEN 325 MG PO TABS
650.0000 mg | ORAL_TABLET | Freq: Four times a day (QID) | ORAL | Status: DC | PRN
  Administered 2012-02-12 – 2012-02-14 (×3): 650 mg via ORAL
  Filled 2012-02-11 (×3): qty 2

## 2012-02-11 MED ORDER — CEFEPIME HCL 2 G IJ SOLR
2.0000 g | Freq: Once | INTRAMUSCULAR | Status: AC
  Administered 2012-02-12: 2 g via INTRAVENOUS
  Filled 2012-02-11: qty 2

## 2012-02-11 MED ORDER — VANCOMYCIN HCL 10 G IV SOLR
1500.0000 mg | Freq: Once | INTRAVENOUS | Status: AC
  Administered 2012-02-12: 1500 mg via INTRAVENOUS
  Filled 2012-02-11: qty 1500

## 2012-02-11 NOTE — Progress Notes (Signed)
ANTIBIOTIC CONSULT NOTE - INITIAL  Pharmacy Consult for vancomycin and cefepime Indication: rule out sepsis  Allergies  Allergen Reactions  . Penicillins Rash    Unknown reaction    Patient Measurements: Height: 5\' 4"  (162.6 cm) Weight: 158 lb (71.668 kg) IBW/kg (Calculated) : 54.7   Vital Signs: Temp: 104.1 F (40.1 C) (02/05 2335) Temp src: Rectal (02/05 2335) BP: 104/49 mmHg (02/05 2335) Pulse Rate: 101  (02/05 2335)   Microbiology: No results found for this or any previous visit (from the past 720 hour(s)).  Medical History: Past Medical History  Diagnosis Date  . Human immunodeficiency virus (HIV) disease   . Hypertension   . ESRD (end stage renal disease)     On dialysis since 2010  . Clostridium difficile infection 04/04/10    07/2011 and 08/2011  . Bacteriuria, asymptomatic 04/04/10    Culture grew VRE sensitive to linesolid   . MGUS (monoclonal gammopathy of unknown significance)   . History of bacteremia     MSSA; 05/2011, 08/2011  . Anemia   . 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  Goal of Therapy:  Pre-HD vanc level 15-25  Plan:  Will give vancomycin 1500mg  IV x1 followed by 750mg  IV after each HD as well as cefepime 2g now and after each HD and monitor CBC, Cx, levels prn.  Rogue Bussing PharmD BCPS 02/11/2012,11:54 PM

## 2012-02-11 NOTE — ED Notes (Signed)
Pt complains of right arm pain.  Per ems pt has had some bleeding from her right upper arm fistula.  Report of fever of 102 from nursing facility

## 2012-02-12 ENCOUNTER — Emergency Department (HOSPITAL_COMMUNITY): Payer: Medicaid Other

## 2012-02-12 ENCOUNTER — Encounter (HOSPITAL_COMMUNITY): Payer: Self-pay | Admitting: *Deleted

## 2012-02-12 DIAGNOSIS — I428 Other cardiomyopathies: Secondary | ICD-10-CM

## 2012-02-12 DIAGNOSIS — N186 End stage renal disease: Secondary | ICD-10-CM

## 2012-02-12 DIAGNOSIS — I959 Hypotension, unspecified: Secondary | ICD-10-CM

## 2012-02-12 DIAGNOSIS — B20 Human immunodeficiency virus [HIV] disease: Principal | ICD-10-CM

## 2012-02-12 DIAGNOSIS — J189 Pneumonia, unspecified organism: Secondary | ICD-10-CM

## 2012-02-12 LAB — CBC WITH DIFFERENTIAL/PLATELET
Basophils Absolute: 0 10*3/uL (ref 0.0–0.1)
Basophils Relative: 0 % (ref 0–1)
Eosinophils Absolute: 0.2 10*3/uL (ref 0.0–0.7)
Eosinophils Relative: 1 % (ref 0–5)
HCT: 35.9 % — ABNORMAL LOW (ref 36.0–46.0)
Hemoglobin: 11.7 g/dL — ABNORMAL LOW (ref 12.0–15.0)
Lymphocytes Relative: 18 % (ref 12–46)
Lymphs Abs: 2 10*3/uL (ref 0.7–4.0)
MCH: 35.2 pg — ABNORMAL HIGH (ref 26.0–34.0)
MCHC: 32.6 g/dL (ref 30.0–36.0)
MCV: 108.1 fL — ABNORMAL HIGH (ref 78.0–100.0)
Monocytes Absolute: 0.7 10*3/uL (ref 0.1–1.0)
Monocytes Relative: 7 % (ref 3–12)
Neutro Abs: 8 10*3/uL — ABNORMAL HIGH (ref 1.7–7.7)
Neutrophils Relative %: 73 % (ref 43–77)
Platelets: 257 10*3/uL (ref 150–400)
RBC: 3.32 MIL/uL — ABNORMAL LOW (ref 3.87–5.11)
RDW: 14.6 % (ref 11.5–15.5)
WBC: 10.9 10*3/uL — ABNORMAL HIGH (ref 4.0–10.5)

## 2012-02-12 LAB — COMPREHENSIVE METABOLIC PANEL
ALT: <5 U/L (ref 0–35)
AST: 16 U/L (ref 0–37)
Albumin: 2.6 g/dL — ABNORMAL LOW (ref 3.5–5.2)
Alkaline Phosphatase: 107 U/L (ref 39–117)
BUN: 49 mg/dL — ABNORMAL HIGH (ref 6–23)
CO2: 27 mEq/L (ref 19–32)
Calcium: 9.4 mg/dL (ref 8.4–10.5)
Chloride: 89 mEq/L — ABNORMAL LOW (ref 96–112)
Creatinine, Ser: 7.05 mg/dL — ABNORMAL HIGH (ref 0.50–1.10)
GFR calc Af Amer: 7 mL/min — ABNORMAL LOW (ref >90)
GFR calc non Af Amer: 6 mL/min — ABNORMAL LOW (ref >90)
Glucose, Bld: 87 mg/dL (ref 70–99)
Potassium: 5.3 mEq/L — ABNORMAL HIGH (ref 3.5–5.1)
Sodium: 129 mEq/L — ABNORMAL LOW (ref 135–145)
Total Bilirubin: 0.4 mg/dL (ref 0.3–1.2)
Total Protein: 9.3 g/dL — ABNORMAL HIGH (ref 6.0–8.3)

## 2012-02-12 LAB — RENAL FUNCTION PANEL
Albumin: 2.4 g/dL — ABNORMAL LOW (ref 3.5–5.2)
BUN: 62 mg/dL — ABNORMAL HIGH (ref 6–23)
CO2: 23 mEq/L (ref 19–32)
Calcium: 8.9 mg/dL (ref 8.4–10.5)
Chloride: 92 mEq/L — ABNORMAL LOW (ref 96–112)
Creatinine, Ser: 7.94 mg/dL — ABNORMAL HIGH (ref 0.50–1.10)
GFR calc Af Amer: 6 mL/min — ABNORMAL LOW (ref >90)
GFR calc non Af Amer: 5 mL/min — ABNORMAL LOW (ref >90)
Glucose, Bld: 127 mg/dL — ABNORMAL HIGH (ref 70–99)
Phosphorus: 6.7 mg/dL — ABNORMAL HIGH (ref 2.3–4.6)
Potassium: 5 mEq/L (ref 3.5–5.1)
Sodium: 131 mEq/L — ABNORMAL LOW (ref 135–145)

## 2012-02-12 LAB — PROCALCITONIN: Procalcitonin: 2.85 ng/mL

## 2012-02-12 LAB — CG4 I-STAT (LACTIC ACID): Lactic Acid, Venous: 1.19 mmol/L (ref 0.5–2.2)

## 2012-02-12 MED ORDER — PENTAFLUOROPROP-TETRAFLUOROETH EX AERO
INHALATION_SPRAY | CUTANEOUS | Status: AC
  Filled 2012-02-12: qty 103.5

## 2012-02-12 MED ORDER — FOLIC ACID 1 MG PO TABS
1000.0000 ug | ORAL_TABLET | Freq: Every day | ORAL | Status: DC
  Filled 2012-02-12: qty 1

## 2012-02-12 MED ORDER — MIDODRINE HCL 5 MG PO TABS
10.0000 mg | ORAL_TABLET | Freq: Two times a day (BID) | ORAL | Status: DC
  Administered 2012-02-12: 10 mg via ORAL
  Filled 2012-02-12 (×2): qty 2

## 2012-02-12 MED ORDER — ONDANSETRON HCL 4 MG PO TABS: 4.0000 mg | ORAL_TABLET | Freq: Four times a day (QID) | ORAL | Status: DC | PRN

## 2012-02-12 MED ORDER — SODIUM CHLORIDE 0.9 % IV SOLN: 100.0000 mL | INTRAVENOUS | Status: DC | PRN

## 2012-02-12 MED ORDER — HEPARIN SODIUM (PORCINE) 1000 UNIT/ML DIALYSIS: 1000.0000 [IU] | INTRAMUSCULAR | Status: DC | PRN

## 2012-02-12 MED ORDER — HEPARIN SODIUM (PORCINE) 1000 UNIT/ML DIALYSIS: 20.0000 [IU]/kg | INTRAMUSCULAR | Status: DC | PRN

## 2012-02-12 MED ORDER — B COMPLEX-C PO TABS
1.0000 | ORAL_TABLET | Freq: Every day | ORAL | Status: DC
  Administered 2012-02-12 – 2012-02-17 (×6): 1 via ORAL
  Filled 2012-02-12 (×6): qty 1

## 2012-02-12 MED ORDER — ONDANSETRON HCL 4 MG/2ML IJ SOLN: 4.0000 mg | Freq: Four times a day (QID) | INTRAMUSCULAR | Status: DC | PRN

## 2012-02-12 MED ORDER — LIDOCAINE-PRILOCAINE 2.5-2.5 % EX CREA
1.0000 "application " | TOPICAL_CREAM | CUTANEOUS | Status: DC | PRN
  Filled 2012-02-12: qty 5

## 2012-02-12 MED ORDER — CALCIUM ACETATE 667 MG PO CAPS
1334.0000 mg | ORAL_CAPSULE | Freq: Three times a day (TID) | ORAL | Status: DC
  Administered 2012-02-13 – 2012-02-17 (×7): 1334 mg via ORAL
  Filled 2012-02-12 (×17): qty 2

## 2012-02-12 MED ORDER — SODIUM CHLORIDE 0.9 % IJ SOLN
3.0000 mL | Freq: Two times a day (BID) | INTRAMUSCULAR | Status: DC
  Administered 2012-02-13 (×2): 3 mL via INTRAVENOUS

## 2012-02-12 MED ORDER — HEPARIN SODIUM (PORCINE) 5000 UNIT/ML IJ SOLN
5000.0000 [IU] | Freq: Three times a day (TID) | INTRAMUSCULAR | Status: DC
  Administered 2012-02-13 – 2012-02-17 (×12): 5000 [IU] via SUBCUTANEOUS
  Filled 2012-02-12 (×19): qty 1

## 2012-02-12 MED ORDER — ALTEPLASE 2 MG IJ SOLR
2.0000 mg | Freq: Once | INTRAMUSCULAR | Status: DC | PRN
  Filled 2012-02-12: qty 2

## 2012-02-12 MED ORDER — B COMPLEX PO TABS: 1.0000 | ORAL_TABLET | Freq: Every day | ORAL | Status: DC

## 2012-02-12 MED ORDER — NEPRO/CARBSTEADY PO LIQD: 237.0000 mL | ORAL | Status: DC | PRN

## 2012-02-12 MED ORDER — DOXERCALCIFEROL 4 MCG/2ML IV SOLN
2.0000 ug | INTRAVENOUS | Status: DC
  Administered 2012-02-12: 2 ug via INTRAVENOUS
  Administered 2012-02-14: 4 ug via INTRAVENOUS
  Administered 2012-02-17: 2 ug via INTRAVENOUS
  Filled 2012-02-12 (×3): qty 2

## 2012-02-12 MED ORDER — NEPRO/CARBSTEADY PO LIQD
237.0000 mL | ORAL | Status: DC | PRN
  Filled 2012-02-12: qty 237

## 2012-02-12 MED ORDER — OXYCODONE HCL 5 MG PO TABS
ORAL_TABLET | ORAL | Status: AC
  Administered 2012-02-12: 5 mg via ORAL
  Filled 2012-02-12: qty 1

## 2012-02-12 MED ORDER — SODIUM CHLORIDE 0.9 % IJ SOLN: 3.0000 mL | INTRAMUSCULAR | Status: DC | PRN

## 2012-02-12 MED ORDER — ABACAVIR SULFATE 300 MG PO TABS
600.0000 mg | ORAL_TABLET | Freq: Every day | ORAL | Status: DC
  Administered 2012-02-12 – 2012-02-17 (×6): 600 mg via ORAL
  Filled 2012-02-12 (×6): qty 2

## 2012-02-12 MED ORDER — MIDODRINE HCL 5 MG PO TABS
10.0000 mg | ORAL_TABLET | Freq: Three times a day (TID) | ORAL | Status: DC
  Administered 2012-02-12 – 2012-02-17 (×13): 10 mg via ORAL
  Filled 2012-02-12 (×17): qty 2

## 2012-02-12 MED ORDER — MIDODRINE HCL 5 MG PO TABS
10.0000 mg | ORAL_TABLET | Freq: Two times a day (BID) | ORAL | Status: DC
  Filled 2012-02-12: qty 2

## 2012-02-12 MED ORDER — LIDOCAINE-PRILOCAINE 2.5-2.5 % EX CREA: TOPICAL_CREAM | CUTANEOUS | Status: DC | PRN

## 2012-02-12 MED ORDER — DOXERCALCIFEROL 4 MCG/2ML IV SOLN
INTRAVENOUS | Status: AC
  Administered 2012-02-12: 2 ug via INTRAVENOUS
  Filled 2012-02-12: qty 2

## 2012-02-12 MED ORDER — MAGIC MOUTHWASH
5.0000 mL | Freq: Two times a day (BID) | ORAL | Status: DC
  Administered 2012-02-12 – 2012-02-17 (×10): 5 mL via ORAL
  Filled 2012-02-12 (×13): qty 5

## 2012-02-12 MED ORDER — LIDOCAINE HCL (PF) 1 % IJ SOLN: 5.0000 mL | INTRAMUSCULAR | Status: DC | PRN

## 2012-02-12 MED ORDER — RITONAVIR 100 MG PO TABS
100.0000 mg | ORAL_TABLET | Freq: Every day | ORAL | Status: DC
  Administered 2012-02-13 – 2012-02-17 (×5): 100 mg via ORAL
  Filled 2012-02-12 (×7): qty 1

## 2012-02-12 MED ORDER — ASPIRIN 81 MG PO TABS: 81.0000 mg | ORAL_TABLET | Freq: Every day | ORAL | Status: DC

## 2012-02-12 MED ORDER — VANCOMYCIN HCL 1000 MG IV SOLR: 750.0000 mg | INTRAVENOUS | Status: DC

## 2012-02-12 MED ORDER — LIDOCAINE-PRILOCAINE 2.5-2.5 % EX CREA
1.0000 "application " | TOPICAL_CREAM | CUTANEOUS | Status: DC
  Filled 2012-02-12: qty 5

## 2012-02-12 MED ORDER — NA FERRIC GLUC CPLX IN SUCROSE 12.5 MG/ML IV SOLN
125.0000 mg | INTRAVENOUS | Status: DC
  Administered 2012-02-12 – 2012-02-17 (×3): 125 mg via INTRAVENOUS
  Filled 2012-02-12 (×4): qty 10

## 2012-02-12 MED ORDER — DARUNAVIR ETHANOLATE 800 MG PO TABS
800.0000 mg | ORAL_TABLET | Freq: Every day | ORAL | Status: DC
  Administered 2012-02-13 – 2012-02-17 (×5): 800 mg via ORAL
  Filled 2012-02-12 (×6): qty 1

## 2012-02-12 MED ORDER — LACTATED RINGERS IV BOLUS (SEPSIS)
1000.0000 mL | Freq: Once | INTRAVENOUS | Status: AC
  Administered 2012-02-12: 1000 mL via INTRAVENOUS

## 2012-02-12 MED ORDER — PENTAFLUOROPROP-TETRAFLUOROETH EX AERO: 1.0000 "application " | INHALATION_SPRAY | CUTANEOUS | Status: DC | PRN

## 2012-02-12 MED ORDER — SODIUM CHLORIDE 0.9 % IV SOLN: 250.0000 mL | INTRAVENOUS | Status: DC | PRN

## 2012-02-12 MED ORDER — RENA-VITE PO TABS
1.0000 | ORAL_TABLET | Freq: Every day | ORAL | Status: DC
  Administered 2012-02-12 – 2012-02-17 (×6): 1 via ORAL
  Filled 2012-02-12 (×6): qty 1

## 2012-02-12 MED ORDER — LAMIVUDINE 10 MG/ML PO SOLN
50.0000 mg | Freq: Every day | ORAL | Status: DC
  Administered 2012-02-12 – 2012-02-17 (×6): 50 mg via ORAL
  Filled 2012-02-12 (×6): qty 5

## 2012-02-12 MED ORDER — GUAIFENESIN 100 MG/5ML PO SOLN
10.0000 mL | Freq: Four times a day (QID) | ORAL | Status: DC | PRN
  Filled 2012-02-12: qty 10

## 2012-02-12 MED ORDER — NEPRO/CARBSTEADY PO LIQD
237.0000 mL | Freq: Two times a day (BID) | ORAL | Status: DC
  Administered 2012-02-13: 237 mL via ORAL

## 2012-02-12 MED ORDER — OXYCODONE HCL 5 MG PO TABS
5.0000 mg | ORAL_TABLET | Freq: Four times a day (QID) | ORAL | Status: DC | PRN
  Administered 2012-02-12: 5 mg via ORAL

## 2012-02-12 MED ORDER — DEXTROSE 5 % IV SOLN
2.0000 g | INTRAVENOUS | Status: DC
  Administered 2012-02-13: 2 g via INTRAVENOUS
  Filled 2012-02-12 (×2): qty 2

## 2012-02-12 MED ORDER — SODIUM CHLORIDE 0.9 % IJ SOLN
3.0000 mL | Freq: Two times a day (BID) | INTRAMUSCULAR | Status: DC
  Administered 2012-02-13 – 2012-02-17 (×11): 3 mL via INTRAVENOUS

## 2012-02-12 NOTE — H&P (Signed)
Hospital Admission Note Date: 02/12/2012  Patient name: [name] Medical record number: [id number] Date of birth: 01/22/1955 Age: 57 y.o. Gender: female PCP: Janell Quiet, MD  Medical Service: Internal medicine teaching service  Attending physician: Dr. Drucilla Schmidt    1st Contact: Sadek    Pager: [phone] 2nd Contact: Nicoletta Dress    Pager: [phone] After 5 pm or weekends: 1st Contact:      Pager: [phone] 2nd Contact:      Pager: [phone]  Chief Complaint: Cough and fever.  History of Present Illness: Patient is a 44 year woman well-known to service with completed a past history including HIV, ESRD, multiple infections including VRE bacteremia, C. difficile colitis, HCAP and multiple other problems as per problem list comes to the ED with cough and fever for past few days. The initial trigger for the patient to be in ED was bleeding from right upper extremity hemodialysis access- which stopped soon after patient arrived in ED. Overall patient feels okay except cough. She has been coughing without any significant sputum production. Denies any chills or sweating. Her appetite has been okay. She has a private room in nursing home right now and so does not have any roommate who has been sick although she does report that a patient at the HD Center was sick. She was found to have fever of 102 at nursing home yesterday. She denies any chest pain, abdominal pain, headache, nausea vomiting, diarrhea, palpitations, vision changes.  Meds: Current Outpatient Rx  Name  Route  Sig  Dispense  Refill  . ABACAVIR SULFATE 300 MG PO TABS   Oral   Take 2 tablets (600 mg total) by mouth daily.   60 tablet   11   . MAGIC MOUTHWASH   Oral   Take by mouth 2 (two) times daily. With lunch and dinner         . ASPIRIN 81 MG PO TABS   Oral   Take 81 mg by mouth daily.           . B COMPLEX PO TABS   Oral   Take 1 tablet by mouth daily.           [name] DARUNAVIR ETHANOLATE 800 MG PO TABS   Oral   Take 1 tablet  (800 mg total) by mouth daily with breakfast.   30 tablet   0   . FOLIC ACID A999333 MCG PO TABS   Oral   Take 800 mcg by mouth daily.         [name] LAMIVUDINE 10 MG/ML PO SOLN   Oral   Take 5 mLs (50 mg total) by mouth daily.   240 mL      . LIDOCAINE-PRILOCAINE 2.5-2.5 % EX CREA   Topical   Apply 1 application topically 3 (three) times a week. Apply to right arm 30 minutes before leaving for dialysis. Wrap with saran wrap. Dialysis days are Tuesdays, Thursdays, and Saturdays.         [name] MIDODRINE HCL 10 MG PO TABS   Oral   Take 10 mg by mouth 2 (two) times daily at 10 AM and 5 PM.         . RITONAVIR 100 MG PO TABS   Oral   Take 1 tablet (100 mg total) by mouth daily with breakfast.   30 tablet   0   . ENSURE COMPLETE PO LIQD   Oral   Take 237 mLs by mouth 2 (two) times daily between  meals.   60 Bottle   11   . GUAIFENESIN 100 MG/5ML PO SOLN   Oral   Take 10 mLs (200 mg total) by mouth every 6 (six) hours as needed.   1200 mL   0   . NEPRO/CARB STEADY PO LIQD   Oral   Take 237 mLs by mouth as needed (missed meal during dialysis.).   30 Can   11   . OXYCODONE HCL 5 MG PO TABS   Oral   Take 5 mg by mouth every 6 (six) hours as needed. For pain           Allergies: Allergies as of 02/11/2012 - Review Complete 02/11/2012  Allergen Reaction Noted  . Penicillins Rash 12/23/2010   Past Medical History  Diagnosis Date  . Human immunodeficiency virus (HIV) disease   . Hypertension   . ESRD (end stage renal disease)     On dialysis since 2010  . Clostridium difficile infection 04/04/10    07/2011 and 08/2011  . Bacteriuria, asymptomatic 04/04/10    Culture grew VRE sensitive to linesolid   . MGUS (monoclonal gammopathy of unknown significance)   . History of bacteremia     MSSA; 05/2011, 08/2011  . Anemia   . Systolic heart failure     08/27/11 EF 35-40%  . CHF (congestive heart failure)   . Cancer    Past Surgical History  Procedure Date  . Av fistula  placement   . Insertion of dialysis catheter 12/30/2010    Procedure: INSERTION OF DIALYSIS CATHETER;  Surgeon: Elam Dutch, MD;  Location: [place];  Service: Vascular;  Laterality: Left;  Insertion of left internal jugular dialysis catheter  . Vascular surgery   . Insertion of dialysis catheter 05/26/2011    Procedure: INSERTION OF DIALYSIS CATHETER;  Surgeon: Conrad St. Charles, MD;  Location: [place];  Service: Vascular;  Laterality: N/A;  Insertion tunneled dialysis catheter in Right Internal Jugular with 23 cm catheter   . Insertion of dialysis catheter 08/26/2011    Procedure: INSERTION OF DIALYSIS CATHETER;  Surgeon: Elam Dutch, MD;  Location: [facility] OR;  Service: Vascular;  Laterality: Right;  insertion of Right Femoral vein Dialysis catheter  . Tee without cardioversion 08/27/2011    Procedure: TRANSESOPHAGEAL ECHOCARDIOGRAM (TEE);  Surgeon: Pixie Casino, MD;  Location: [place];  Service: Cardiovascular;  Laterality: N/A;  . Av fistula placement 09/01/2011    Procedure: INSERTION OF ARTERIOVENOUS (AV) GORE-TEX GRAFT ARM;  Surgeon: Rosetta Posner, MD;  Location: [facility] OR;  Service: Vascular;  Laterality: Right;  Using 22mm x 50  stretch goretex graft   Family History  Problem Relation Age of Onset  . Alcohol abuse      family h/o addiction/alcoholism  . Diabetes Cousin     first degree relatives  . Kidney disease Mother   . Kidney disease Maternal Uncle   . Cancer Sister    History   Social History  . Marital Status: Single    Spouse Name: N/A    Number of Children: N/A  . Years of Education: N/A   Occupational History  . Not on file.   Social History Main Topics  . Smoking status: Never Smoker   . Smokeless tobacco: Never Used  . Alcohol Use: No  . Drug Use: No  . Sexually Active: No   Other Topics Concern  . Not on file   Social History Narrative   Lives with daughter(does not know dx). Oldest  daughter does not dx.Current partner of may years,? fidelity     Review of Systems: As per history of present illness. All other systems reviewed and negative.  Physical Exam: Blood pressure 85/48, pulse 78, temperature 99.2 F (37.3 C), temperature source Oral, resp. rate 20, height 5\' 4"  (1.626 m), weight 158 lb (71.668 kg), SpO2 97.00%. Constitutional: Vital signs reviewed.  Patient is a well-developed and well-nourished in no acute distress and cooperative with exam. Alert and oriented x3.  Head: Normocephalic and atraumatic Mouth: no erythema or exudates, MMM Eyes: PERRL, EOMI, conjunctivae normal, No scleral icterus.  Neck: Supple, Trachea midline normal ROM, No JVD, mass, thyromegaly, or carotid bruit present.  Cardiovascular: RRR, S1 normal, S2 normal, systolic murmur Pulmonary/Chest: Coarse breath sounds bilaterally with decreased breath sounds on left lower base. No wheezing. Abdominal: Soft. Non-tender, non-distended, bowel sounds are normal, no masses, organomegaly, or guarding present.  Musculoskeletal: No joint deformities, erythema, or stiffness, ROM full and no nontender  extremities: Right upper arm bleeding- stopped. Bandage in place.   trace right pedal edema.  Neurological: A&O x3, Strength is normal and symmetric bilaterally, cranial nerve II-XII are grossly intact, no focal motor deficit, sensory intact to light touch bilaterally.  Skin: Warm, dry and intact. No rash, cyanosis, or clubbing.  Psychiatric: Normal mood and affect. speech and behavior is normal.     Lab results: Basic Metabolic Panel:  Basename 02/12/12 0025  NA 129*  K 5.3*  CL 89*  CO2 27  GLUCOSE 87  BUN 49*  CREATININE 7.05*  CALCIUM 9.4  MG --  PHOS --   Liver Function Tests:  Basename 02/12/12 0025  AST 16  ALT <5  ALKPHOS 107  BILITOT 0.4  PROT 9.3*  ALBUMIN 2.6*   CBC:  Basename 02/12/12 0025  WBC 10.9*  NEUTROABS 8.0*  HGB 11.7*  HCT 35.9*  MCV 108.1*  PLT 257    Imaging results:  Dg Chest 2 View  02/12/2012  *RADIOLOGY  REPORT*  Clinical Data: Fever and cough.  CHEST - 2 VIEW  Comparison: 12/21/2011  Findings: Significant interval improvement of parenchymal consolidation seen previously on the left.  There is some residual infiltration in the left lower lung which could represent edema or recurrent pneumonia. Shallow inspiration.  Cardiac enlargement with pulmonary vascular congestion.  Slight interstitial changes likely to represent edema.  No blunting of costophrenic angles.  No pneumothorax.  Mediastinal contours appear intact.  IMPRESSION: Significant interval improvement of previous left upper lung consolidation.  Cardiac enlargement with pulmonary vascular congestion and edema.  Infiltration in the left lung may represent edema or recurrent pneumonia.   Original Report Authenticated By: Lucienne Capers, M.D.     Other results: EKG: Sinus tachycardia without any significant ST-T wave changes concerning for ischemia or infarction.  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 She has multiple communities including HIV and multiple past infections including VRE. Last admission was for HCAP in December 2013. WBC 10.9 and BMP consistent with ESRD. No hypoxia or respiratory distress. Lactate 1.19 and pro-calcitonin 2.85. PCCM he was initially consulted in ED who recommended continuing HCAP treatment along with stress dose steroids as needed for low blood pressure along with Midodrine. -  Blood pressure significantly improved with hydration with normal saline at 2 L. No need for pressors or stress to steroid at this point in time.  - Admit to inpatient. - Vancomycin and cefepime started in ED. -  blood cultures x2 collected in ED - Pending urine studies.  2) ESRD: HD at [place] on Tuesday, Thursday and Saturday. - Right upper extremity access site bleeding stopped. - Renal consulted for HD.  3) HIV: Recently saw Dr. Drucilla Schmidt on 02/04/2012 when CD4 count was 320. - Continue home HIV medications.  4) systolic Cardiomyopathy: Compensated. Last EF 35-40% in August 2013  5) DVT prophylaxis: Heparin.  Dispo: Disposition is deferred at this time, awaiting improvement of current medical problems. Anticipated discharge in approximately 2-3 day(s).   The patient does have a current PCP (GARG, ANKIT, MD), therefore will be requiring OPC follow-up after discharge.   The patient does not know have transportation limitations that hinder transportation to clinic appointments.  Signed: Quantrell Splitt 02/12/2012, 9:15 AM

## 2012-02-12 NOTE — Consult Note (Signed)
PULMONARY  / CRITICAL CARE MEDICINE  Name: [name] MRN: [id number] DOB: 1955/10/24    ADMISSION DATE:  02/11/2012 CONSULTATION DATE:  02/12/2012  REFERRING MD :  EMD  CHIEF COMPLAINT:  Hypotension  HISTORY OF PRESENT ILLNESS:  58 yo with CHF, ESRD on HD HIV+ NH patient with baseline hypotension and relative adrenal insufficiency brought to [facility] ED c/o right arm pain and bleeding from right arm fistula which has stopped now.  Per nursing home had fever of 102 in ED 104.  Feels at her baseline.  Reports some dry cough recently.  Last HD 2/4.  No fever, palpitations, dyspnea, GI or GU symptoms.  History of VRE, MSSA bacteremia and pseudomembranous colitis. Random cortisol of 13 on 05/18/2011 in presence of hypotension.  Received Cefepime / Vancomycin in ED.  PAST MEDICAL HISTORY :  Past Medical History  Diagnosis Date  . Human immunodeficiency virus (HIV) disease   . Hypertension   . ESRD (end stage renal disease)     On dialysis since 2010  . Clostridium difficile infection 04/04/10    07/2011 and 08/2011  . Bacteriuria, asymptomatic 04/04/10    Culture grew VRE sensitive to linesolid   . MGUS (monoclonal gammopathy of unknown significance)   . History of bacteremia     MSSA; 05/2011, 08/2011  . Anemia   . Systolic heart failure     08/27/11 EF 35-40%   Past Surgical History  Procedure Date  . Av fistula placement   . Insertion of dialysis catheter 12/30/2010    Procedure: INSERTION OF DIALYSIS CATHETER;  Surgeon: Elam Dutch, MD;  Location: [place];  Service: Vascular;  Laterality: Left;  Insertion of left internal jugular dialysis catheter  . Vascular surgery   . Insertion of dialysis catheter 05/26/2011    Procedure: INSERTION OF DIALYSIS CATHETER;  Surgeon: Conrad Henryville, MD;  Location: [place];  Service: Vascular;  Laterality: N/A;  Insertion tunneled dialysis catheter in Right Internal Jugular with 23 cm catheter   . Insertion of dialysis catheter 08/26/2011    Procedure: INSERTION  OF DIALYSIS CATHETER;  Surgeon: Elam Dutch, MD;  Location: [facility] OR;  Service: Vascular;  Laterality: Right;  insertion of Right Femoral vein Dialysis catheter  . Tee without cardioversion 08/27/2011    Procedure: TRANSESOPHAGEAL ECHOCARDIOGRAM (TEE);  Surgeon: Pixie Casino, MD;  Location: [place];  Service: Cardiovascular;  Laterality: N/A;  . Av fistula placement 09/01/2011    Procedure: INSERTION OF ARTERIOVENOUS (AV) GORE-TEX GRAFT ARM;  Surgeon: Rosetta Posner, MD;  Location: [facility] OR;  Service: Vascular;  Laterality: Right;  Using 22mm x 50  stretch goretex graft   Prior to Admission medications   Medication Sig Start Date End Date Taking? Authorizing Provider  abacavir (ZIAGEN) 300 MG tablet Take 2 tablets (600 mg total) by mouth daily. 02/04/12  Yes Truman Hayward, MD  Alum & Mag Hydroxide-Simeth (MAGIC MOUTHWASH) SOLN Take by mouth 2 (two) times daily. With lunch and dinner   Yes Historical Provider, MD  aspirin 81 MG tablet Take 81 mg by mouth daily.     Yes Historical Provider, MD  b complex vitamins tablet Take 1 tablet by mouth daily.     Yes Historical Provider, MD  Darunavir Ethanolate (PREZISTA) 800 MG tablet Take 1 tablet (800 mg total) by mouth daily with breakfast. 09/02/11  Yes Cresenciano Genre, MD  folic acid (FOLVITE) A999333 MCG tablet Take 800 mcg by mouth daily.   Yes  Historical Provider, MD  lamiVUDine (EPIVIR) 10 MG/ML solution Take 5 mLs (50 mg total) by mouth daily. 09/02/11 09/01/12 Yes Cresenciano Genre, MD  lidocaine-prilocaine (EMLA) cream Apply 1 application topically 3 (three) times a week. Apply to right arm 30 minutes before leaving for dialysis. Wrap with saran wrap. Dialysis days are Tuesdays, Thursdays, and Saturdays.   Yes Historical Provider, MD  midodrine (PROAMATINE) 10 MG tablet Take 10 mg by mouth 2 (two) times daily at 10 AM and 5 PM.   Yes Historical Provider, MD  ritonavir (NORVIR) 100 MG TABS Take 1 tablet (100 mg total) by mouth daily with breakfast.  09/02/11  Yes Cresenciano Genre, MD  feeding supplement (ENSURE COMPLETE) LIQD Take 237 mLs by mouth 2 (two) times daily between meals. 05/30/11   Charlann Lange, MD  guaiFENesin (ROBITUSSIN) 100 MG/5ML SOLN Take 10 mLs (200 mg total) by mouth every 6 (six) hours as needed. 12/24/11   Cresenciano Genre, MD  Nutritional Supplements (FEEDING SUPPLEMENT, NEPRO CARB STEADY,) LIQD Take 237 mLs by mouth as needed (missed meal during dialysis.). 05/30/11   Charlann Lange, MD  oxyCODONE (OXY IR/ROXICODONE) 5 MG immediate release tablet Take 5 mg by mouth every 6 (six) hours as needed. For pain    Historical Provider, MD   Allergies  Allergen Reactions  . Penicillins Rash    Unknown reaction   FAMILY HISTORY:  Family History  Problem Relation Age of Onset  . Alcohol abuse      family h/o addiction/alcoholism  . Diabetes Cousin     first degree relatives  . Kidney disease Mother   . Kidney disease Maternal Uncle   . Cancer Sister    SOCIAL HISTORY:  reports that she has never smoked. She has never used smokeless tobacco. She reports that she does not drink alcohol or use illicit drugs.  REVIEW OF SYSTEMS:  Negative except as in HPI  VITAL SIGNS: Temp:  [101.2 F (38.4 C)-104.1 F (40.1 C)] 101.2 F (38.4 C) (02/06 0126) Pulse Rate:  [79-105] 79  (02/06 0307) Resp:  [18-26] 18  (02/06 0307) BP: (70-104)/(35-49) 70/48 mmHg (02/06 0307) SpO2:  [76 %-98 %] 95 % (02/06 0307) Weight:  [71.668 kg (158 lb)] 71.668 kg (158 lb) (02/05 2335)  PHYSICAL EXAMINATION: General:  Comfortable, no distress Neuro:  Awake, alert, oriented, non focal HEENT:  PERRL Neck:  No JVD Cardiovascular:  RRR, systolic murmur Lungs:  Bilateral diminished air entry, bibasilar rales Abdomen:  Soft, non tender, bowel sounds present Musculoskeletal:  Trace edema lower extremities Skin:  Intact  LABS:  Lab 02/12/12 0028 02/12/12 0025  HGB -- 11.7*  WBC -- 10.9*  PLT -- 257  NA -- 129*  K -- 5.3*  CL -- 89*  CO2 -- 27  GLUCOSE -- 87   BUN -- 49*  CREATININE -- 7.05*  CALCIUM -- 9.4  MG -- --  PHOS -- --  AST -- 16  ALT -- <5  ALKPHOS -- 107  BILITOT -- 0.4  PROT -- 9.3*  ALBUMIN -- 2.6*  INR -- --  APTT -- --  INR -- --  LATICACIDVEN 1.19 --  TROPONINI -- --  PHART -- --  PCO2ART -- --  PO2ART -- --  HCO3 -- --  O2SAT -- --   Dg Chest 2 View  02/12/2012  *RADIOLOGY REPORT*  Clinical Data: Fever and cough.  CHEST - 2 VIEW  Comparison: 12/21/2011  Findings: Significant interval improvement of parenchymal consolidation seen  previously on the left.  There is some residual infiltration in the left lower lung which could represent edema or recurrent pneumonia. Shallow inspiration.  Cardiac enlargement with pulmonary vascular congestion.  Slight interstitial changes likely to represent edema.  No blunting of costophrenic angles.  No pneumothorax.  Mediastinal contours appear intact.  IMPRESSION: Significant interval improvement of previous left upper lung consolidation.  Cardiac enlargement with pulmonary vascular congestion and edema.  Infiltration in the left lung may represent edema or recurrent pneumonia.   Original Report Authenticated By: Lucienne Capers, M.D.    ASSESSMENT / PLAN:  Hypotension, now at baseline Relative adrenal insufficiency Suspected HCAP Possible bacteremia Non-ischemia cardiomyopathy HIV+ History of MSSA, VRE, C.dif  -->  No indications for ICU admission, stable for SDU -->  Continue Midodrine -->  Stress dose steroids -->  Agree with Cefepime / Vancomycin -->  Follow blood cultures  Pulmonary and [place] Pager: [phone]  02/12/2012, 5:04 AM

## 2012-02-12 NOTE — ED Notes (Signed)
Phlebotomy is at the bedside collecting blood (including blood cultures x2)

## 2012-02-12 NOTE — Progress Notes (Signed)
Dialysis initiated via RUA AVG 15gax1" up/down using aeseptic technique. Area around access is red, inflammed. Able to cannulate, but BFR has to be decreased due to decreased arterial pressure. Dr. Mercy Moore notified-no new orders

## 2012-02-12 NOTE — ED Provider Notes (Signed)
History     CSN: [id number]  Arrival date & time 02/11/12  2326   First MD Initiated Contact with Patient 02/11/12 2340      Chief Complaint  Patient presents with  . Arm Pain  . Fever    (Consider location/radiation/quality/duration/timing/severity/associated sxs/prior treatment) HPIPeggy A [name] is a 57 y.o. female arrives to the ER with chief complaint of right arm pain, she's had some history of bleeding from the right upper arm fistula however it seems to be hemostatic now. Patient's right arm pain is moderate, nonradiating, located around her fistula, no other alleviating or exacerbating factors no other associated symptoms. Per nursing home, she had a fever of 102. Here in the emergency department, the patient has a fever of 104. Patient says she feels fine but has complaints of a recent cough. Patient got dialysis yesterday and is due for dialysis tomorrow. She is end-stage renal disease, also has hypertension and HIV. She denies any chest pain, shortness of breath, dizziness or lightheadedness, abdominal pain, nausea vomiting or diarrhea. Patient was admitted in December for a healthcare acquired pneumonia.  Past Medical History  Diagnosis Date  . Human immunodeficiency virus (HIV) disease   . Hypertension   . ESRD (end stage renal disease)     On dialysis since 2010  . Clostridium difficile infection 04/04/10    07/2011 and 08/2011  . Bacteriuria, asymptomatic 04/04/10    Culture grew VRE sensitive to linesolid   . MGUS (monoclonal gammopathy of unknown significance)   . History of bacteremia     MSSA; 05/2011, 08/2011  . Anemia   . Systolic heart failure     08/27/11 EF 35-40%    Past Surgical History  Procedure Date  . Av fistula placement   . Insertion of dialysis catheter 12/30/2010    Procedure: INSERTION OF DIALYSIS CATHETER;  Surgeon: Elam Dutch, MD;  Location: [place];  Service: Vascular;  Laterality: Left;  Insertion of left internal jugular dialysis catheter   . Vascular surgery   . Insertion of dialysis catheter 05/26/2011    Procedure: INSERTION OF DIALYSIS CATHETER;  Surgeon: Conrad Sierra View, MD;  Location: [place];  Service: Vascular;  Laterality: N/A;  Insertion tunneled dialysis catheter in Right Internal Jugular with 23 cm catheter   . Insertion of dialysis catheter 08/26/2011    Procedure: INSERTION OF DIALYSIS CATHETER;  Surgeon: Elam Dutch, MD;  Location: [facility] OR;  Service: Vascular;  Laterality: Right;  insertion of Right Femoral vein Dialysis catheter  . Tee without cardioversion 08/27/2011    Procedure: TRANSESOPHAGEAL ECHOCARDIOGRAM (TEE);  Surgeon: Pixie Casino, MD;  Location: [place];  Service: Cardiovascular;  Laterality: N/A;  . Av fistula placement 09/01/2011    Procedure: INSERTION OF ARTERIOVENOUS (AV) GORE-TEX GRAFT ARM;  Surgeon: Rosetta Posner, MD;  Location: [facility] OR;  Service: Vascular;  Laterality: Right;  Using 71mm x 50  stretch goretex graft    Family History  Problem Relation Age of Onset  . Alcohol abuse      family h/o addiction/alcoholism  . Diabetes Cousin     first degree relatives  . Kidney disease Mother   . Kidney disease Maternal Uncle   . Cancer Sister     History  Substance Use Topics  . Smoking status: Never Smoker   . Smokeless tobacco: Never Used  . Alcohol Use: No    OB History    Grav Para Term Preterm Abortions TAB SAB Ect Mult Living  Review of Systems At least 10pt or greater review of systems completed and are negative except where specified in the HPI.  Allergies  Penicillins  Home Medications   Current Outpatient Rx  Name  Route  Sig  Dispense  Refill  . ABACAVIR SULFATE 300 MG PO TABS   Oral   Take 2 tablets (600 mg total) by mouth daily.   60 tablet   11   . MAGIC MOUTHWASH   Oral   Take by mouth 2 (two) times daily. With lunch and dinner         . ASPIRIN 81 MG PO TABS   Oral   Take 81 mg by mouth daily.           . B COMPLEX PO TABS    Oral   Take 1 tablet by mouth daily.           [name] DARUNAVIR ETHANOLATE 800 MG PO TABS   Oral   Take 1 tablet (800 mg total) by mouth daily with breakfast.   30 tablet   0   . FOLIC ACID A999333 MCG PO TABS   Oral   Take 800 mcg by mouth daily.         [name] LAMIVUDINE 10 MG/ML PO SOLN   Oral   Take 5 mLs (50 mg total) by mouth daily.   240 mL      . LIDOCAINE-PRILOCAINE 2.5-2.5 % EX CREA   Topical   Apply 1 application topically 3 (three) times a week. Apply to right arm 30 minutes before leaving for dialysis. Wrap with saran wrap. Dialysis days are Tuesdays, Thursdays, and Saturdays.         [name] MIDODRINE HCL 10 MG PO TABS   Oral   Take 10 mg by mouth 2 (two) times daily at 10 AM and 5 PM.         . RITONAVIR 100 MG PO TABS   Oral   Take 1 tablet (100 mg total) by mouth daily with breakfast.   30 tablet   0   . ENSURE COMPLETE PO LIQD   Oral   Take 237 mLs by mouth 2 (two) times daily between meals.   60 Bottle   11   . GUAIFENESIN 100 MG/5ML PO SOLN   Oral   Take 10 mLs (200 mg total) by mouth every 6 (six) hours as needed.   1200 mL   0   . NEPRO/CARB STEADY PO LIQD   Oral   Take 237 mLs by mouth as needed (missed meal during dialysis.).   30 Can   11   . OXYCODONE HCL 5 MG PO TABS   Oral   Take 5 mg by mouth every 6 (six) hours as needed. For pain           BP 98/40  Pulse 101  Temp 103.1 F (39.5 C) (Oral)  Resp 24  Ht 5\' 4"  (1.626 m)  Wt 158 lb (71.668 kg)  BMI 27.12 kg/m2  SpO2 76%  Physical Exam  Nursing notes reviewed.  Electronic medical record reviewed. VITAL SIGNS:   Filed Vitals:   02/11/12 2335 02/12/12 0000  BP: 104/49 98/40  Pulse: 101   Temp: 104.1 F (40.1 C) 103.1 F (39.5 C)  TempSrc: Rectal Oral  Resp: 18 24  Height: 5\' 4"  (1.626 m)   Weight: 158 lb (71.668 kg)   SpO2: 98% 76%   CONSTITUTIONAL: Awake, oriented, appears non-toxic HENT: Atraumatic, normocephalic, oral mucosa  pink and moist, airway patent. Nares  patent without drainage. External ears normal. EYES: Conjunctiva mildly injected, EOMI, PERRLA NECK: Trachea midline, non-tender, supple CARDIOVASCULAR: Normal heart rate, Normal rhythm. Late systolic murmur 3/6 at the apex is loudest, rubs, gallops PULMONARY/CHEST: Rales in the bases bilaterally worse on the right side diminished in the right base. Symmetrical breath sounds. Non-tender. ABDOMINAL: Non-distended, soft, non-tender - no rebound or guarding.  BS normal. NEUROLOGIC: Non-focal, moving all four extremities, no gross sensory or motor deficits. EXTREMITIES: No clubbing, cyanosis. 1+ nonpitting edema bilateral lower extremities SKIN: Warm, Dry, No erythema, areas of hyperpigmentation over her body and circular dime-sized shapes  ED Course  Procedures (including critical care time)  Date: 02/12/2012  Rate: 106  Rhythm: Sinus tachycardia  QRS Axis: Left axis deviation  Intervals: normal  ST/T Wave abnormalities: normal  Conduction Disutrbances: Left anterior fascicular block  Narrative Interpretation: Probable LVH - no significant change from last EKG dated 12/19/2011   Labs Reviewed  CBC WITH DIFFERENTIAL - Abnormal; Notable for the following:    WBC 10.9 (*)     RBC 3.32 (*)     Hemoglobin 11.7 (*)     HCT 35.9 (*)     MCV 108.1 (*)     MCH 35.2 (*)     Neutro Abs 8.0 (*)     All other components within normal limits  COMPREHENSIVE METABOLIC PANEL - Abnormal; Notable for the following:    Sodium 129 (*)     Potassium 5.3 (*)     Chloride 89 (*)     BUN 49 (*)     Creatinine, Ser 7.05 (*)     Total Protein 9.3 (*)     Albumin 2.6 (*)     GFR calc non Af Amer 6 (*)     GFR calc Af Amer 7 (*)     All other components within normal limits  PROCALCITONIN  CG4 I-STAT (LACTIC ACID)  CULTURE, BLOOD (ROUTINE X 2)  CULTURE, BLOOD (ROUTINE X 2)  URINALYSIS, ROUTINE W REFLEX MICROSCOPIC  URINE CULTURE   Dg Chest 2 View  02/12/2012  *RADIOLOGY REPORT*  Clinical Data:  Fever and cough.  CHEST - 2 VIEW  Comparison: 12/21/2011  Findings: Significant interval improvement of parenchymal consolidation seen previously on the left.  There is some residual infiltration in the left lower lung which could represent edema or recurrent pneumonia. Shallow inspiration.  Cardiac enlargement with pulmonary vascular congestion.  Slight interstitial changes likely to represent edema.  No blunting of costophrenic angles.  No pneumothorax.  Mediastinal contours appear intact.  IMPRESSION: Significant interval improvement of previous left upper lung consolidation.  Cardiac enlargement with pulmonary vascular congestion and edema.  Infiltration in the left lung may represent edema or recurrent pneumonia.   Original Report Authenticated By: Lucienne Capers, M.D.      1. Cardiomyopathy   2. ESRD (end stage renal disease)   3. HCAP (healthcare-associated pneumonia)   4. Human immunodeficiency virus (HIV) disease   5. Hypotension       MDM  [name] is a 57 y.o. female with end-stage renal disease Tuesday near presenting with fever of 104, right arm pain cough and some diminished sounds at the right base. With fever vomiting at present the diagnosis of pneumonia, obtain blood cultures and treat the patient with cefepime and vancomycin for  presumed HCAP.  Patient is DO NOT RESUSCITATE  Discuss patient's case with internal medicine teaching service, we will evaluate for  admission. After the resident evaluate the patient, therefore me that he cannot admit the patient secondary to low blood pressure.   Patient blood pressure is typically low, she does have a history of a relative adrenal insufficiency. Patient has been evaluated by critical care who does not think that the patient requires ICU care at this time.  During evaluation, the patient said she did not want to be admitted to the hospital, even when cautioned that because she is HIV positive and end-stage renal disease that a  fever of 104 is significant and could represent a significant infection-I told her that I would recommend inpatient hospitalization for further medical care including antibiotics by her IV. Patient still initially wanted to be discharged to dialysis.  Eventually, the patient acquiesced to be admitted, will have her dialyzed here, discussed with critical care for admission because teaching service not comfortable with low blood pressures likely secondary to ESRD and relative adrenal insufficiency. Patient is not exhibiting septic physiology, she was only mildly tachycardic at 101 when she came in, with a temperature of 104, blood pressure that time was 104/49 - currently patient's blood pressure 99/53, and not tachycardic with a pulse rate of 81 and a temperature of 98.7. She is not tachypneic and is not hypoxic - blood cell count is only mildly elevated at 10.9 not sure how indicative of infection that is with her other comorbidities.  Patient's lactate is within normal limits as is her procalcitonin.             Rhunette Croft, MD 02/12/12 [phone]

## 2012-02-12 NOTE — ED Notes (Signed)
Pt stated that she was only going to leave AMA because she thought she could not have dialysis at [facility].  Pt stated that she would stay if we could do her dialysis today.

## 2012-02-12 NOTE — Consult Note (Signed)
[facility] Renal Consultation Note    Indication for Consultation:  Management of ESRD/hemodialysis; anemia, hypertension/volume and secondary hyperparathyroidism  HPI: [name] is a 57 y.o. female ESRD patient (TTS HD) with a history of hypotension and HIV with frequent opportunistic infections. She presented to the [facility] ED c/o right arm pain and bleeding from RUA AVF which has now resolved.  She was also found to be febrile at her NH [facility] 102) and subsequently in the ED at 104. She is somewhat of a poor historian but does endorse a dry cough of unknown duration. CXR indicates significant improvement in LU lung consolidation as compared to 12/21/11.  There is pulmonary congestion and edema with left lung infiltrate concerning for recurrent PNA.  She was started on Cefepime and Vancomycin in the ED pending blood cultures.   Her last HD was 2/4. She complains of severe cramping in her arm/legs and signs off early.  There have been several recent increases in her EDW.     Past Medical History  Diagnosis Date  . Human immunodeficiency virus (HIV) disease   . Hypertension   . ESRD (end stage renal disease)     On dialysis since 2010  . Clostridium difficile infection 04/04/10    07/2011 and 08/2011  . Bacteriuria, asymptomatic 04/04/10    Culture grew VRE sensitive to linesolid   . MGUS (monoclonal gammopathy of unknown significance)   . History of bacteremia     MSSA; 05/2011, 08/2011  . Anemia   . Systolic heart failure     08/27/11 EF 35-40%  . CHF (congestive heart failure)   . Cancer    Past Surgical History  Procedure Date  . Av fistula placement   . Insertion of dialysis catheter 12/30/2010    Procedure: INSERTION OF DIALYSIS CATHETER;  Surgeon: Elam Dutch, MD;  Location: [place];  Service: Vascular;  Laterality: Left;  Insertion of left internal jugular dialysis catheter  . Vascular surgery   . Insertion of dialysis catheter 05/26/2011    Procedure:  INSERTION OF DIALYSIS CATHETER;  Surgeon: Conrad Torreon, MD;  Location: [place];  Service: Vascular;  Laterality: N/A;  Insertion tunneled dialysis catheter in Right Internal Jugular with 23 cm catheter   . Insertion of dialysis catheter 08/26/2011    Procedure: INSERTION OF DIALYSIS CATHETER;  Surgeon: Elam Dutch, MD;  Location: [facility] OR;  Service: Vascular;  Laterality: Right;  insertion of Right Femoral vein Dialysis catheter  . Tee without cardioversion 08/27/2011    Procedure: TRANSESOPHAGEAL ECHOCARDIOGRAM (TEE);  Surgeon: Pixie Casino, MD;  Location: [place];  Service: Cardiovascular;  Laterality: N/A;  . Av fistula placement 09/01/2011    Procedure: INSERTION OF ARTERIOVENOUS (AV) GORE-TEX GRAFT ARM;  Surgeon: Rosetta Posner, MD;  Location: [facility] OR;  Service: Vascular;  Laterality: Right;  Using 50mm x 50  stretch goretex graft   Family History  Problem Relation Age of Onset  . Alcohol abuse      family h/o addiction/alcoholism  . Diabetes Cousin     first degree relatives  . Kidney disease Mother   . Kidney disease Maternal Uncle   . Cancer Sister    Social History:  reports that she has never smoked. She has never used smokeless tobacco. She reports that she does not drink alcohol or use illicit drugs. Allergies  Allergen Reactions  . Penicillins Rash    Unknown reaction   Prior to Admission medications   Medication  Sig Start Date End Date Taking? Authorizing Provider  abacavir (ZIAGEN) 300 MG tablet Take 2 tablets (600 mg total) by mouth daily. 02/04/12  Yes Truman Hayward, MD  Alum & Mag Hydroxide-Simeth (MAGIC MOUTHWASH) SOLN Take by mouth 2 (two) times daily. With lunch and dinner   Yes Historical Provider, MD  aspirin 81 MG tablet Take 81 mg by mouth daily.     Yes Historical Provider, MD  b complex vitamins tablet Take 1 tablet by mouth daily.     Yes Historical Provider, MD  Darunavir Ethanolate (PREZISTA) 800 MG tablet Take 1 tablet (800 mg total) by mouth daily  with breakfast. 09/02/11  Yes Cresenciano Genre, MD  folic acid (FOLVITE) A999333 MCG tablet Take 800 mcg by mouth daily.   Yes Historical Provider, MD  lamiVUDine (EPIVIR) 10 MG/ML solution Take 5 mLs (50 mg total) by mouth daily. 09/02/11 09/01/12 Yes Cresenciano Genre, MD  lidocaine-prilocaine (EMLA) cream Apply 1 application topically 3 (three) times a week. Apply to right arm 30 minutes before leaving for dialysis. Wrap with saran wrap. Dialysis days are Tuesdays, Thursdays, and Saturdays.   Yes Historical Provider, MD  midodrine (PROAMATINE) 10 MG tablet Take 10 mg by mouth 2 (two) times daily at 10 AM and 5 PM.   Yes Historical Provider, MD  ritonavir (NORVIR) 100 MG TABS Take 1 tablet (100 mg total) by mouth daily with breakfast. 09/02/11  Yes Cresenciano Genre, MD  feeding supplement (ENSURE COMPLETE) LIQD Take 237 mLs by mouth 2 (two) times daily between meals. 05/30/11   Charlann Lange, MD  guaiFENesin (ROBITUSSIN) 100 MG/5ML SOLN Take 10 mLs (200 mg total) by mouth every 6 (six) hours as needed. 12/24/11   Cresenciano Genre, MD  Nutritional Supplements (FEEDING SUPPLEMENT, NEPRO CARB STEADY,) LIQD Take 237 mLs by mouth as needed (missed meal during dialysis.). 05/30/11   Charlann Lange, MD  oxyCODONE (OXY IR/ROXICODONE) 5 MG immediate release tablet Take 5 mg by mouth every 6 (six) hours as needed. For pain    Historical Provider, MD   Current Facility-Administered Medications  Medication Dose Route Frequency Provider Last Rate Last Dose  . acetaminophen (TYLENOL) tablet 650 mg  650 mg Oral Q6H PRN John-Adam Bonk, MD   650 mg at 02/12/12 0015   Current Outpatient Prescriptions  Medication Sig Dispense Refill  . abacavir (ZIAGEN) 300 MG tablet Take 2 tablets (600 mg total) by mouth daily.  60 tablet  11  . Alum & Mag Hydroxide-Simeth (MAGIC MOUTHWASH) SOLN Take by mouth 2 (two) times daily. With lunch and dinner      . aspirin 81 MG tablet Take 81 mg by mouth daily.        [name] b complex vitamins tablet Take 1 tablet by mouth  daily.        . Darunavir Ethanolate (PREZISTA) 800 MG tablet Take 1 tablet (800 mg total) by mouth daily with breakfast.  30 tablet  0  . folic acid (FOLVITE) A999333 MCG tablet Take 800 mcg by mouth daily.      [name] lamiVUDine (EPIVIR) 10 MG/ML solution Take 5 mLs (50 mg total) by mouth daily.  240 mL    . lidocaine-prilocaine (EMLA) cream Apply 1 application topically 3 (three) times a week. Apply to right arm 30 minutes before leaving for dialysis. Wrap with saran wrap. Dialysis days are Tuesdays, Thursdays, and Saturdays.      . midodrine (PROAMATINE) 10 MG tablet Take 10 mg by mouth  2 (two) times daily at 10 AM and 5 PM.      . ritonavir (NORVIR) 100 MG TABS Take 1 tablet (100 mg total) by mouth daily with breakfast.  30 tablet  0  . feeding supplement (ENSURE COMPLETE) LIQD Take 237 mLs by mouth 2 (two) times daily between meals.  60 Bottle  11  . guaiFENesin (ROBITUSSIN) 100 MG/5ML SOLN Take 10 mLs (200 mg total) by mouth every 6 (six) hours as needed.  1200 mL  0  . Nutritional Supplements (FEEDING SUPPLEMENT, NEPRO CARB STEADY,) LIQD Take 237 mLs by mouth as needed (missed meal during dialysis.).  30 Can  11  . oxyCODONE (OXY IR/ROXICODONE) 5 MG immediate release tablet Take 5 mg by mouth every 6 (six) hours as needed. For pain       Labs: Basic Metabolic Panel:  Lab [id number]  NA 129*  K 5.3*  CL 89*  CO2 27  GLUCOSE 87  BUN 49*  CREATININE 7.05*  CALCIUM 9.4  ALB --  PHOS --   Liver Function Tests:  Lab 02/12/12 0025  AST 16  ALT <5  ALKPHOS 107  BILITOT 0.4  PROT 9.3*  ALBUMIN 2.6*   CBC:  Lab 02/12/12 0025  WBC 10.9*  NEUTROABS 8.0*  HGB 11.7*  HCT 35.9*  MCV 108.1*  PLT 257   Studies/Results: Dg Chest 2 View  02/12/2012  *RADIOLOGY REPORT*  Clinical Data: Fever and cough.  CHEST - 2 VIEW  Comparison: 12/21/2011  Findings: Significant interval improvement of parenchymal consolidation seen previously on the left.  There is some residual infiltration in the  left lower lung which could represent edema or recurrent pneumonia. Shallow inspiration.  Cardiac enlargement with pulmonary vascular congestion.  Slight interstitial changes likely to represent edema.  No blunting of costophrenic angles.  No pneumothorax.  Mediastinal contours appear intact.  IMPRESSION: Significant interval improvement of previous left upper lung consolidation.  Cardiac enlargement with pulmonary vascular congestion and edema.  Infiltration in the left lung may represent edema or recurrent pneumonia.   Original Report Authenticated By: Lucienne Capers, M.D.     ROS: + cough No cp No change in bowels No vision changes No new arthritic co No new neurologic co   Physical Exam: Filed Vitals:   02/12/12 0911 02/12/12 1009 02/12/12 1100 02/12/12 1122  BP: 87/52 101/53 91/58 91/58   Pulse: 74 76 75   Temp:    98.8 F (37.1 C)  TempSrc:    Oral  Resp: 20 20 19 17   Height:      Weight:      SpO2: 100% 100% 100% 99%     General: Chronically-ill appearing, slow mentation, NAD Head: Normocephalic, atraumatic, sclera non-icteric, mucus membranes are moist Neck: Supple.   JVD elevated.  Lungs: Diminished breath sounds bilaterally.  Some work of breathing when speaking.  Bil crackles R>>L Heart: RRR,  Abdomen: Soft, non-tender, non-distended with normoactive bowel sounds. No rebound/guarding. No obvious abdominal masses. M-S:  Strength and tone appear normal for age. Lower extremities: 1+ edema, No ischemic changes, no open wounds   RUA AVG with mild erythema and + bruit Neuro: Alert and oriented X 3. Moves all extremities spontaneously. Psych:  Responds to questions appropriately with a normal affect. Dialysis Access: RUA AVG with + bruit  Dialysis Orders: [facility]  on TTS . EDW 64.5 HD Bath 2K/2.25Ca  Time 4:00 Heparin 5900. Access RUA AVF BFR 400 DFR A1.5   Hectorol 2 mcg IV/HD Epogen 18,600  Units IV/HD  Venofer  50 mg IV weekly  Several recent EDW increases.   Ongoing complaints of cramping.  Assessment/Plan: 1. Suspected HCAP - On vancomycin and Cefepime pending blood cultures. 2. Baseline Hypotension - On Midodrine 10 mg bid op.  Will reorder. 3. Adrenal Insufficiency - Stress dose steroids per admit. 4. ESRD -  TTS @ [place].  K+5.3.  HD today.  Requests emla cream 5. Hypertension/volume  -  Hx of hypotension at baseline.  Midodrine 10 mg BID.  Has been signing off early due to cramping.  7 kg above EDW today by weights.  SOB, Pulm edema on CXR. Neck vein distension.  Needs serial HD while inpatient. UF goal 3L today and tomorrow given hypotension. 6. Anemia  - 11.7 on op Epo. Tsat 15% and Ferritin 585 on 1/28. IV fe ordered. 7. Metabolic bone disease -  Ca 9.4 (10.5 corrected) ^ phos op. PTH 180.9 on 1/28. Phoslo 2 TID WC. Hectorol 2. 8. Nutrition - Albumin 2.4. Nepro and liberalized low K+ diet as OP. Will continue here secondary to malnutrition.  9. HIV - Continue home meds  Sonnie Alamo, PA-C [facility] [phone] pager 02/12/2012, 11:55 AM   I have seen and examined this patient and agree with plan Sonnie Alamo.   56yo BF sent to ER for fever and cough.  ? PNA +/- volume overload.  Chronic hypotension so will increase midodrine.  Mild erythema over AVG of ? Significance and will follow for now.  BC done and empiric antibiotics given.  Plan HD today and again tomorow to try to control volume. Wilfrid Hyser T,MD 02/12/2012 3:09 PM

## 2012-02-12 NOTE — Progress Notes (Signed)
Lab called report of gram positive cocci in cluster in all 4 bottles of blood culture.  Dr. Mercy Moore notified/no new orders

## 2012-02-12 NOTE — ED Notes (Signed)
CC MD called and stated they would contact teaching services regarding pt admission

## 2012-02-12 NOTE — Procedures (Signed)
Pt seen on HD Ap 230 Vp 170.  BFR 300 due to high Ap.  SBP 100.

## 2012-02-12 NOTE — ED Notes (Signed)
Pt taken to radiology

## 2012-02-12 NOTE — ED Notes (Signed)
Pt watching tv, no needs at this time

## 2012-02-12 NOTE — ED Notes (Signed)
BC hung after phlebotomy drew BCx2.  Pt does not make urine at all per her report.  Will not be able to collect urine for lab testing due to this.

## 2012-02-12 NOTE — ED Notes (Signed)
Report received, assumed care.  

## 2012-02-12 NOTE — ED Notes (Signed)
Admitting MD at bedside.

## 2012-02-12 NOTE — ED Notes (Signed)
Report taken from [place], [place]

## 2012-02-13 ENCOUNTER — Inpatient Hospital Stay (HOSPITAL_COMMUNITY): Payer: Medicaid Other | Admitting: Anesthesiology

## 2012-02-13 ENCOUNTER — Encounter (HOSPITAL_COMMUNITY)
Admission: EM | Disposition: A | Discharge status: Skilled Nursing Facility | Payer: Self-pay | Source: Home / Self Care | Attending: Infectious Disease

## 2012-02-13 ENCOUNTER — Encounter (HOSPITAL_COMMUNITY): Payer: Self-pay | Admitting: *Deleted

## 2012-02-13 ENCOUNTER — Encounter (HOSPITAL_COMMUNITY): Payer: Self-pay | Admitting: Anesthesiology

## 2012-02-13 ENCOUNTER — Encounter (HOSPITAL_COMMUNITY): Payer: Self-pay | Admitting: Certified Registered Nurse Anesthetist

## 2012-02-13 DIAGNOSIS — R9431 Abnormal electrocardiogram [ECG] [EKG]: Secondary | ICD-10-CM

## 2012-02-13 DIAGNOSIS — A4902 Methicillin resistant Staphylococcus aureus infection, unspecified site: Secondary | ICD-10-CM

## 2012-02-13 DIAGNOSIS — A419 Sepsis, unspecified organism: Secondary | ICD-10-CM

## 2012-02-13 DIAGNOSIS — I498 Other specified cardiac arrhythmias: Secondary | ICD-10-CM

## 2012-02-13 DIAGNOSIS — I9589 Other hypotension: Secondary | ICD-10-CM

## 2012-02-13 DIAGNOSIS — R7881 Bacteremia: Secondary | ICD-10-CM

## 2012-02-13 DIAGNOSIS — D638 Anemia in other chronic diseases classified elsewhere: Secondary | ICD-10-CM

## 2012-02-13 DIAGNOSIS — T82898A Other specified complication of vascular prosthetic devices, implants and grafts, initial encounter: Secondary | ICD-10-CM

## 2012-02-13 DIAGNOSIS — A0472 Enterocolitis due to Clostridium difficile, not specified as recurrent: Secondary | ICD-10-CM

## 2012-02-13 HISTORY — PX: THROMBECTOMY AND REVISION OF ARTERIOVENTOUS (AV) GORETEX  GRAFT: SHX6120

## 2012-02-13 LAB — RENAL FUNCTION PANEL
Albumin: 2.3 g/dL — ABNORMAL LOW (ref 3.5–5.2)
BUN: 33 mg/dL — ABNORMAL HIGH (ref 6–23)
CO2: 25 mEq/L (ref 19–32)
Calcium: 9.3 mg/dL (ref 8.4–10.5)
Chloride: 94 mEq/L — ABNORMAL LOW (ref 96–112)
Creatinine, Ser: 5.67 mg/dL — ABNORMAL HIGH (ref 0.50–1.10)
GFR calc Af Amer: 9 mL/min — ABNORMAL LOW (ref >90)
GFR calc non Af Amer: 8 mL/min — ABNORMAL LOW (ref >90)
Glucose, Bld: 164 mg/dL — ABNORMAL HIGH (ref 70–99)
Phosphorus: 5.2 mg/dL — ABNORMAL HIGH (ref 2.3–4.6)
Potassium: 3.9 mEq/L (ref 3.5–5.1)
Sodium: 134 mEq/L — ABNORMAL LOW (ref 135–145)

## 2012-02-13 LAB — CBC
HCT: 33 % — ABNORMAL LOW (ref 36.0–46.0)
Hemoglobin: 10.6 g/dL — ABNORMAL LOW (ref 12.0–15.0)
MCH: 35.3 pg — ABNORMAL HIGH (ref 26.0–34.0)
MCHC: 32.1 g/dL (ref 30.0–36.0)
MCV: 110 fL — ABNORMAL HIGH (ref 78.0–100.0)
Platelets: 222 10*3/uL (ref 150–400)
RBC: 3 MIL/uL — ABNORMAL LOW (ref 3.87–5.11)
RDW: 14.5 % (ref 11.5–15.5)
WBC: 8.4 10*3/uL (ref 4.0–10.5)

## 2012-02-13 LAB — LACTIC ACID, PLASMA: Lactic Acid, Venous: 2.3 mmol/L — ABNORMAL HIGH (ref 0.5–2.2)

## 2012-02-13 SURGERY — THROMBECTOMY AND REVISION OF ARTERIOVENTOUS (AV) GORETEX  GRAFT
Anesthesia: General | Site: Arm Upper | Laterality: Right | Wound class: Clean

## 2012-02-13 MED ORDER — LIDOCAINE-PRILOCAINE 2.5-2.5 % EX CREA: 1.0000 "application " | TOPICAL_CREAM | CUTANEOUS | Status: DC | PRN

## 2012-02-13 MED ORDER — LIDOCAINE HCL (CARDIAC) 20 MG/ML IV SOLN
INTRAVENOUS | Status: DC | PRN
  Administered 2012-02-13: 80 mg via INTRAVENOUS

## 2012-02-13 MED ORDER — ROCURONIUM BROMIDE 100 MG/10ML IV SOLN
INTRAVENOUS | Status: DC | PRN
  Administered 2012-02-13: 10 mg via INTRAVENOUS
  Administered 2012-02-13: 20 mg via INTRAVENOUS
  Administered 2012-02-13: 10 mg via INTRAVENOUS

## 2012-02-13 MED ORDER — SODIUM CHLORIDE 0.9 % IV SOLN
INTRAVENOUS | Status: DC | PRN
  Administered 2012-02-13 (×2): via INTRAVENOUS

## 2012-02-13 MED ORDER — HEPARIN SODIUM (PORCINE) 1000 UNIT/ML IJ SOLN
INTRAMUSCULAR | Status: DC | PRN
  Administered 2012-02-13: 4 mL via INTRAVENOUS

## 2012-02-13 MED ORDER — ALTEPLASE 2 MG IJ SOLR: 2.0000 mg | Freq: Once | INTRAMUSCULAR | Status: AC | PRN

## 2012-02-13 MED ORDER — HEPARIN SODIUM (PORCINE) 1000 UNIT/ML DIALYSIS: 1000.0000 [IU] | INTRAMUSCULAR | Status: DC | PRN

## 2012-02-13 MED ORDER — NEOSTIGMINE METHYLSULFATE 1 MG/ML IJ SOLN
INTRAMUSCULAR | Status: DC | PRN
  Administered 2012-02-13: 1 mg via INTRAVENOUS
  Administered 2012-02-13: 4 mg via INTRAVENOUS

## 2012-02-13 MED ORDER — ONDANSETRON HCL 4 MG/2ML IJ SOLN
INTRAMUSCULAR | Status: DC | PRN
  Administered 2012-02-13: 4 mg via INTRAVENOUS

## 2012-02-13 MED ORDER — VANCOMYCIN HCL 1000 MG IV SOLR
750.0000 mg | INTRAVENOUS | Status: DC
  Administered 2012-02-13 – 2012-02-14 (×2): 750 mg via INTRAVENOUS
  Filled 2012-02-13 (×3): qty 750

## 2012-02-13 MED ORDER — HYDROMORPHONE HCL PF 1 MG/ML IJ SOLN: 0.2500 mg | INTRAMUSCULAR | Status: DC | PRN

## 2012-02-13 MED ORDER — PHENYLEPHRINE HCL 10 MG/ML IJ SOLN
INTRAMUSCULAR | Status: DC | PRN
  Administered 2012-02-13 (×2): 80 ug via INTRAVENOUS

## 2012-02-13 MED ORDER — CEFAZOLIN SODIUM-DEXTROSE 2-3 GM-% IV SOLR: 2.0000 g | INTRAVENOUS | Status: DC

## 2012-02-13 MED ORDER — PENTAFLUOROPROP-TETRAFLUOROETH EX AERO: 1.0000 "application " | INHALATION_SPRAY | CUTANEOUS | Status: DC | PRN

## 2012-02-13 MED ORDER — LIDOCAINE HCL (PF) 1 % IJ SOLN: 5.0000 mL | INTRAMUSCULAR | Status: DC | PRN

## 2012-02-13 MED ORDER — SODIUM CHLORIDE 0.9 % IV SOLN: 100.0000 mL | INTRAVENOUS | Status: DC | PRN

## 2012-02-13 MED ORDER — LINEZOLID 2 MG/ML IV SOLN
600.0000 mg | Freq: Two times a day (BID) | INTRAVENOUS | Status: DC
  Administered 2012-02-13: 600 mg via INTRAVENOUS
  Filled 2012-02-13 (×2): qty 300

## 2012-02-13 MED ORDER — 0.9 % SODIUM CHLORIDE (POUR BTL) OPTIME
TOPICAL | Status: DC | PRN
  Administered 2012-02-13: 1000 mL

## 2012-02-13 MED ORDER — PROTAMINE SULFATE 10 MG/ML IV SOLN
INTRAVENOUS | Status: DC | PRN
  Administered 2012-02-13: 20 mg via INTRAVENOUS

## 2012-02-13 MED ORDER — OXYCODONE HCL 5 MG PO TABS: 5.0000 mg | ORAL_TABLET | ORAL | Status: DC | PRN

## 2012-02-13 MED ORDER — ONDANSETRON HCL 4 MG/2ML IJ SOLN: 4.0000 mg | Freq: Once | INTRAMUSCULAR | Status: AC | PRN

## 2012-02-13 MED ORDER — HEPARIN SODIUM (PORCINE) 1000 UNIT/ML DIALYSIS: 20.0000 [IU]/kg | INTRAMUSCULAR | Status: DC | PRN

## 2012-02-13 MED ORDER — NEPRO/CARBSTEADY PO LIQD: 237.0000 mL | ORAL | Status: DC | PRN

## 2012-02-13 MED ORDER — FENTANYL CITRATE 0.05 MG/ML IJ SOLN
INTRAMUSCULAR | Status: DC | PRN
  Administered 2012-02-13 (×4): 50 ug via INTRAVENOUS

## 2012-02-13 MED ORDER — PROPOFOL 10 MG/ML IV BOLUS
INTRAVENOUS | Status: DC | PRN
  Administered 2012-02-13: 80 mg via INTRAVENOUS

## 2012-02-13 MED ORDER — SODIUM CHLORIDE 0.9 % IR SOLN
Status: DC | PRN
  Administered 2012-02-13: 16:00:00

## 2012-02-13 MED ORDER — EPHEDRINE SULFATE 50 MG/ML IJ SOLN
INTRAMUSCULAR | Status: DC | PRN
  Administered 2012-02-13: 10 mg via INTRAVENOUS

## 2012-02-13 MED ORDER — SODIUM CHLORIDE 0.9 % IV SOLN
10.0000 mg | INTRAVENOUS | Status: DC | PRN
  Administered 2012-02-13: 40 ug/min via INTRAVENOUS

## 2012-02-13 MED ORDER — LIDOCAINE-EPINEPHRINE (PF) 1 %-1:200000 IJ SOLN
INTRAMUSCULAR | Status: AC
  Filled 2012-02-13: qty 10

## 2012-02-13 MED ORDER — GLYCOPYRROLATE 0.2 MG/ML IJ SOLN
INTRAMUSCULAR | Status: DC | PRN
  Administered 2012-02-13: 0.6 mg via INTRAVENOUS

## 2012-02-13 SURGICAL SUPPLY — 41 items
ADH SKN CLS APL DERMABOND .7 (GAUZE/BANDAGES/DRESSINGS) ×2
CANISTER SUCTION 2500CC (MISCELLANEOUS) ×3 IMPLANT
CATH EMB 4FR 80CM (CATHETERS) ×2 IMPLANT
CLIP TI MEDIUM 6 (CLIP) ×5 IMPLANT
CLIP TI WIDE RED SMALL 6 (CLIP) ×5 IMPLANT
CLOTH BEACON ORANGE TIMEOUT ST (SAFETY) ×3 IMPLANT
COVER SURGICAL LIGHT HANDLE (MISCELLANEOUS) ×3 IMPLANT
DECANTER SPIKE VIAL GLASS SM (MISCELLANEOUS) IMPLANT
DERMABOND ADVANCED (GAUZE/BANDAGES/DRESSINGS) ×1
DERMABOND ADVANCED .7 DNX12 (GAUZE/BANDAGES/DRESSINGS) ×2 IMPLANT
ELECT REM PT RETURN 9FT ADLT (ELECTROSURGICAL) ×3
ELECTRODE REM PT RTRN 9FT ADLT (ELECTROSURGICAL) ×2 IMPLANT
GAUZE SPONGE 4X4 16PLY XRAY LF (GAUZE/BANDAGES/DRESSINGS) ×2 IMPLANT
GLOVE BIO SURGEON STRL SZ7.5 (GLOVE) ×3 IMPLANT
GLOVE BIOGEL PI IND STRL 6.5 (GLOVE) ×3 IMPLANT
GLOVE BIOGEL PI IND STRL 8 (GLOVE) ×2 IMPLANT
GLOVE BIOGEL PI INDICATOR 6.5 (GLOVE) ×3
GLOVE BIOGEL PI INDICATOR 8 (GLOVE) ×1
GLOVE ECLIPSE 6.5 STRL STRAW (GLOVE) ×6 IMPLANT
GOWN STRL NON-REIN LRG LVL3 (GOWN DISPOSABLE) ×9 IMPLANT
GRAFT GORETEX STND 6X20 (Vascular Products) ×3 IMPLANT
GRAFT GORETEXSTD 6X20 (Vascular Products) ×1 IMPLANT
KIT BASIN OR (CUSTOM PROCEDURE TRAY) ×3 IMPLANT
KIT ROOM TURNOVER OR (KITS) ×3 IMPLANT
NS IRRIG 1000ML POUR BTL (IV SOLUTION) ×3 IMPLANT
PACK CV ACCESS (CUSTOM PROCEDURE TRAY) ×3 IMPLANT
PAD ARMBOARD 7.5X6 YLW CONV (MISCELLANEOUS) ×6 IMPLANT
SPONGE GAUZE 4X4 12PLY (GAUZE/BANDAGES/DRESSINGS) ×2 IMPLANT
SPONGE SURGIFOAM ABS GEL 100 (HEMOSTASIS) IMPLANT
SUT ETHILON 3 0 PS 1 (SUTURE) ×2 IMPLANT
SUT PROLENE 6 0 BV (SUTURE) ×5 IMPLANT
SUT VIC AB 3-0 SH 27 (SUTURE) ×9
SUT VIC AB 3-0 SH 27X BRD (SUTURE) ×4 IMPLANT
SUT VICRYL 4-0 PS2 18IN ABS (SUTURE) ×6 IMPLANT
SWAB CULTURE LIQUID MINI MALE (MISCELLANEOUS) ×2 IMPLANT
TAPE CLOTH SURG 4X10 WHT LF (GAUZE/BANDAGES/DRESSINGS) ×2 IMPLANT
TOWEL OR 17X24 6PK STRL BLUE (TOWEL DISPOSABLE) ×3 IMPLANT
TOWEL OR 17X26 10 PK STRL BLUE (TOWEL DISPOSABLE) ×3 IMPLANT
TUBE ANAEROBIC SPECIMEN COL (MISCELLANEOUS) ×2 IMPLANT
UNDERPAD 30X30 INCONTINENT (UNDERPADS AND DIAPERS) ×3 IMPLANT
WATER STERILE IRR 1000ML POUR (IV SOLUTION) ×3 IMPLANT

## 2012-02-13 NOTE — Consult Note (Signed)
PULMONARY  / CRITICAL CARE MEDICINE  Name: [name] MRN: [id number] DOB: 05/20/1955    ADMISSION DATE:  02/11/2012 CONSULTATION DATE:  2/7  REFERRING MD :  Dr. Tommy Medal  CHIEF COMPLAINT:  Hypotension  BRIEF PATIENT DESCRIPTION: 57 y/o F, SNF Resident, with complex medical history to include HIV, ESRD, multiple infections including VRE bacteremia, C. difficile colitis, HCAP, Endocarditis admitted on 2/5 with cough, fever at SNF (102) and bleeding from RUE AV Graft.   2/7 PCCM consulted for hypotension (chronically on midodrine).    SIGNIFICANT EVENTS / STUDIES:  2/5 - Admit with cough, fever, bleeding AV Graft 2/7 - PCCM consulted for hypotension eval  LINES / TUBES: RUE AV Graft>>>  CULTURES: 2/6 BCx2>>>2/2 with staph>>>  ANTIBIOTICS: Ancef T/Th/S>>> Zyvox 2/7>>>  HISTORY OF PRESENT ILLNESS:  57 y/o F, SNF Resident, with complex medical history to include HIV, ESRD, multiple infections including VRE bacteremia, C. difficile colitis, HCAP, Endocarditis admitted on 2/5 with cough, fever at SNF (102) and bleeding from RUE AV Graft.   AV Graft bleeding stopped in ER with pressure.  She has been noticing pain at HD Graft site without drainage.   2/7 PCCM consulted for hypotension (chronically on midodrine).    PAST MEDICAL HISTORY :  Past Medical History  Diagnosis Date  . Human immunodeficiency virus (HIV) disease   . Hypertension   . ESRD (end stage renal disease)     On dialysis since 2010  . Clostridium difficile infection 04/04/10    07/2011 and 08/2011  . Bacteriuria, asymptomatic 04/04/10    Culture grew VRE sensitive to linesolid   . MGUS (monoclonal gammopathy of unknown significance)   . History of bacteremia     MSSA; 05/2011, 08/2011  . Anemia   . Systolic heart failure     08/27/11 EF 35-40%  . CHF (congestive heart failure)   . Cancer    Past Surgical History  Procedure Date  . Av fistula placement   . Insertion of dialysis catheter 12/30/2010    Procedure:  INSERTION OF DIALYSIS CATHETER;  Surgeon: Elam Dutch, MD;  Location: [place];  Service: Vascular;  Laterality: Left;  Insertion of left internal jugular dialysis catheter  . Vascular surgery   . Insertion of dialysis catheter 05/26/2011    Procedure: INSERTION OF DIALYSIS CATHETER;  Surgeon: Conrad Cherokee, MD;  Location: [place];  Service: Vascular;  Laterality: N/A;  Insertion tunneled dialysis catheter in Right Internal Jugular with 23 cm catheter   . Insertion of dialysis catheter 08/26/2011    Procedure: INSERTION OF DIALYSIS CATHETER;  Surgeon: Elam Dutch, MD;  Location: [facility] OR;  Service: Vascular;  Laterality: Right;  insertion of Right Femoral vein Dialysis catheter  . Tee without cardioversion 08/27/2011    Procedure: TRANSESOPHAGEAL ECHOCARDIOGRAM (TEE);  Surgeon: Pixie Casino, MD;  Location: [facility];  Service: Cardiovascular;  Laterality: N/A;  . Av fistula placement 09/01/2011    Procedure: INSERTION OF ARTERIOVENOUS (AV) GORE-TEX GRAFT ARM;  Surgeon: Rosetta Posner, MD;  Location: [facility] OR;  Service: Vascular;  Laterality: Right;  Using 15mm x 50  stretch goretex graft   Prior to Admission medications   Medication Sig Start Date End Date Taking? Authorizing Provider  abacavir (ZIAGEN) 300 MG tablet Take 2 tablets (600 mg total) by mouth daily. 02/04/12  Yes Truman Hayward, MD  Alum & Mag Hydroxide-Simeth (MAGIC MOUTHWASH) SOLN Take by mouth 2 (two) times daily. With lunch and dinner  Yes Historical Provider, MD  aspirin 81 MG tablet Take 81 mg by mouth daily.     Yes Historical Provider, MD  b complex vitamins tablet Take 1 tablet by mouth daily.     Yes Historical Provider, MD  Darunavir Ethanolate (PREZISTA) 800 MG tablet Take 1 tablet (800 mg total) by mouth daily with breakfast. 09/02/11  Yes Cresenciano Genre, MD  folic acid (FOLVITE) A999333 MCG tablet Take 800 mcg by mouth daily.   Yes Historical Provider, MD  lamiVUDine (EPIVIR) 10 MG/ML solution Take 5 mLs (50 mg total) by  mouth daily. 09/02/11 09/01/12 Yes Cresenciano Genre, MD  lidocaine-prilocaine (EMLA) cream Apply 1 application topically 3 (three) times a week. Apply to right arm 30 minutes before leaving for dialysis. Wrap with saran wrap. Dialysis days are Tuesdays, Thursdays, and Saturdays.   Yes Historical Provider, MD  midodrine (PROAMATINE) 10 MG tablet Take 10 mg by mouth 2 (two) times daily at 10 AM and 5 PM.   Yes Historical Provider, MD  ritonavir (NORVIR) 100 MG TABS Take 1 tablet (100 mg total) by mouth daily with breakfast. 09/02/11  Yes Cresenciano Genre, MD  feeding supplement (ENSURE COMPLETE) LIQD Take 237 mLs by mouth 2 (two) times daily between meals. 05/30/11   Charlann Lange, MD  guaiFENesin (ROBITUSSIN) 100 MG/5ML SOLN Take 10 mLs (200 mg total) by mouth every 6 (six) hours as needed. 12/24/11   Cresenciano Genre, MD  Nutritional Supplements (FEEDING SUPPLEMENT, NEPRO CARB STEADY,) LIQD Take 237 mLs by mouth as needed (missed meal during dialysis.). 05/30/11   Charlann Lange, MD  oxyCODONE (OXY IR/ROXICODONE) 5 MG immediate release tablet Take 5 mg by mouth every 6 (six) hours as needed. For pain    Historical Provider, MD   Allergies  Allergen Reactions  . Penicillins Rash    Unknown reaction    FAMILY HISTORY:  Family History  Problem Relation Age of Onset  . Alcohol abuse      family h/o addiction/alcoholism  . Diabetes Cousin     first degree relatives  . Kidney disease Mother   . Kidney disease Maternal Uncle   . Cancer Sister    SOCIAL HISTORY:  reports that she has never smoked. She has never used smokeless tobacco. She reports that she uses illicit drugs (Cocaine). She reports that she does not drink alcohol.  REVIEW OF SYSTEMS:   Gen: Denies chills, weight change, fatigue, night sweats.  Reported fever 102 prior to admit HEENT: Denies blurred vision, double vision, hearing loss, tinnitus, sinus congestion, rhinorrhea, sore throat, neck stiffness, dysphagia PULM: Denies shortness of breath, cough,  sputum production, hemoptysis, wheezing CV: Denies chest pain, edema, orthopnea, paroxysmal nocturnal dyspnea, palpitations GI: Denies abdominal pain, nausea, vomiting, diarrhea, hematochezia, melena, constipation, change in bowel habits GU: Denies dysuria, hematuria, polyuria, oliguria, urethral discharge Endocrine: Denies hot or cold intolerance, polyuria, polyphagia or appetite change Derm: Denies rash, dry skin, scaling or peeling skin change Heme: Denies easy bruising, bleeding, bleeding gums Neuro: Denies headache, numbness, weakness, slurred speech, loss of memory or consciousness.  C/O pain at HD site   SUBJECTIVE:  Reports pain at HD site.  No other complaints.    VITAL SIGNS: Temp:  [98.2 F (36.8 C)-102.7 F (39.3 C)] 98.4 F (36.9 C) (02/07 0839) Pulse Rate:  [69-98] 80  (02/07 0839) Resp:  [16-24] 20  (02/07 0839) BP: (78-103)/(39-59) 85/53 mmHg (02/07 0839) SpO2:  [91 %-98 %] 91 % (02/07 0839) Weight:  [id number]  lb 6.4 oz (65.5 kg)-151 lb 10.8 oz (68.8 kg)] 144 lb 6.4 oz (65.5 kg) (02/06 1915)  INTAKE / OUTPUT: Intake/Output      02/06 0701 - 02/07 0700 02/07 0701 - 02/08 0700   P.O.  240   I.V. (mL/kg) 2500 (38.2)    Total Intake(mL/kg) 2500 (38.2) 240 (3.7)   Other 2800    Total Output 2800    Net -300 +240          PHYSICAL EXAMINATION: General:  Chronically ill adult female in NAD Neuro:  AAOx4, speech clear, MAE HEENT:  Pupils =R, MAE, mm pink/moist Cardiovascular:  s1s2 rrr, [id number] holosystolic murmur, split S1 Lungs:  resp's even/non-labored, lungs bilaterally essentially clear Abdomen:  Round/soft, bsx4 active Musculoskeletal:  No acute deformities  Skin:  Warm/dry, no edema  LABS:  Lab 02/13/12 0937 02/13/12 0934 02/12/12 1530 02/12/12 0028 02/12/12 0025  HGB -- 10.6* -- -- 11.7*  WBC -- 8.4 -- -- 10.9*  PLT -- 222 -- -- 257  NA -- 134* 131* -- 129*  K -- 3.9 5.0 -- --  CL -- 94* 92* -- 89*  CO2 -- 25 23 -- 27  GLUCOSE -- 164* 127* -- 87  BUN  -- 33* 62* -- 49*  CREATININE -- 5.67* 7.94* -- 7.05*  CALCIUM -- 9.3 8.9 -- 9.4  MG -- -- -- -- --  PHOS -- 5.2* 6.7* -- --  AST -- -- -- -- 16  ALT -- -- -- -- <5  ALKPHOS -- -- -- -- 107  BILITOT -- -- -- -- 0.4  PROT -- -- -- -- 9.3*  ALBUMIN -- 2.3* 2.4* -- 2.6*  APTT -- -- -- -- --  INR -- -- -- -- --  LATICACIDVEN 2.3* -- -- 1.19 --  TROPONINI -- -- -- -- --  PROCALCITON -- -- -- -- 2.85  PROBNP -- -- -- -- --  O2SATVEN -- -- -- -- --  PHART -- -- -- -- --  PCO2ART -- -- -- -- --  PO2ART -- -- -- -- --   No results found for this basename: GLUCAP:5 in the last 168 hours  CXR: 2/6 - essentially clear, mild edema  ASSESSMENT / PLAN:    CARDIOVASCULAR A:  Hypotension - appears to be chronic in nature, on midodrine with normal mental status.  BP has been taken in LUE - pt indicates she has had previous grafts there in the past.  Repeat BP in LLE with normal pressures.  Murmur - noted on exam.  Murmur has been noted in past.  Trivial MR on ECHO 8/21 CHF - appears to euvolemic at present  P:  -continue midodrine -antibiotics per Primary SVC -repeat lactic acid to ensure to increase in trend -caution with narcotics -no role pressors, she is perfusing organs well and upper ext inaccurate cuff -use left lower ext for BP for now  RENAL A:   ESRD - on HD T/Th/S.  Bleeding from graft this admit.  H/H stable.   P:   -HD per Nephrology -Vascular following for graft needs  INFECTIOUS A:   Staph Bacteremia - blood cultures this admit with 2/2 pos for staph, pending sensivities Fever - in setting of above  P:   -ABX per Primary -follow sensitivities on BC -repeat lactic acid as above -endocarditis ?, per ID    TODAY'S SUMMARY: 57 y/o F with extensive medical history.  Hypotension in setting of chronic disease on midodrine at baseline.  BP  readings from LUE (previous graft per pt) do not seem accurate.  She has excellent mental status and not symptomatic.  LE  readings are normotensive.  No need to transfer to higher level of care at this time.  Will repeat lactic acid to ensure no rise.  Noted blood cultures positive for staph.     I have personally obtained a history, examined the patient, evaluated laboratory and imaging results, formulated the assessment and plan and placed orders.   Noe Gens, NP-C [facility]monary and [place] Pager: [phone]  02/13/2012, 11:50 AM  Call if clinical status changes, will S/O  Lavon Paganini. Titus Mould, MD, [place] Pgr: [facility]

## 2012-02-13 NOTE — Progress Notes (Signed)
[facility] Progress Note  Subjective:   Ate a little breakfast  Objective Filed Vitals:   02/12/12 2011 02/13/12 0532 02/13/12 0740 02/13/12 0839  BP: 78/39 83/39  85/53  Pulse: 83 98  80  Temp: 98.2 F (36.8 C) 102.7 F (39.3 C) 98.3 F (36.8 C) 98.4 F (36.9 C)  TempSrc: Oral Oral Oral Oral  Resp: 24 24  20   Height:      Weight:      SpO2: 93% 92%  91%   Physical Exam General: NAD  Heart: RRR 3/6 murmur Lungs: crackles throughout Abdomen: soft NTliver down 4 cm and firm Extremities: lump above right ankle (she thinks from wheelchair trauama - no evidence of infx) Dialysis Access: RUA AVG with + bruit BUT prox cannulation site oozing since yesterday  - either eroded from infection or trauma) flow suboptimal  Dialysis Orders: [facility] .  EDW 64.5 HD Bath 2K/2.25Ca Time 4:00 Heparin 5900. Access RUA AGG BFR 400 DFR A1.5  Hectorol 2 mcg IV/HD Epogen 18,600 Units IV/HD Venofer 50 mg IV weekly  Several recent EDW increases. Ongoing complaints of cramping.   Assessment/Plan:  1. Gram + cocci bacteremia with hx multiple episodes of various bacteremias over the years; ? HCAP - Was on vanc and cefepime; changed to zyvox (due to VRE hx) and per pharm note - adding ancef.. Would repeat CXR after Saturday dialysis; Have asked Rollene Fare, VVS PA to evaluate oozing site from access before her extra HD treatment today.- see PE above - no recent procedures on access- access placed Aug 2013 Rollene Fare has recommended to make pt NPO until seen by the surgeon today. 2. Baseline Hypotension - On Midodrine 10 mg bid op. Will reorder. 3. Adrenal Insufficiency - Stress dose steroids per admit. 4. ESRD - TTS @ [facility]. - extra HD today and then back on sched Sat. 5. Hypotension/volume - Hx of hypotension at baseline. Midodrine 10 mg BID. Has been signing off early due to cramping. 7 UF 2.8 Thursday; dialysis weights and floors weights not equivalent.  For extra HD today and again  Saturday back on schedule. May need lower EDW. Noncompliance with dialysis i.e. Chronically signing off early has contributed to fluid accumulation.  6. Anemia - 11.7 on op Epo. Tsat 15% and Ferritin 585 on 1/28. IV fe ordered; resume ESA if Hgb drops 7. Metabolic bone disease -  PTH 180.9 on 1/28. Phoslo 2 TID WC. Hectorol 2. 8. Nutrition - Albumin 2.4. Nepro and liberalized low K+ diet as OP. Will continue here secondary to malnutrition.  9. HIV - Continue home meds  Myriam Jacobson, PA-C [place] [phone] 02/13/2012,9:40 AM  LOS: 2 days  I have seen and examined this patient and agree with the plan of care as above.  Concern of access, and will get surgery eval.  Vol xs. .  Anala Whisenant L 02/13/2012, 10:56 AM   Additional Objective Labs: Basic Metabolic Panel:  Lab [id number] 02/12/12 0025  NA 131* 129*  K 5.0 5.3*  CL 92* 89*  CO2 23 27  GLUCOSE 127* 87  BUN 62* 49*  CREATININE 7.94* 7.05*  CALCIUM 8.9 9.4  ALB -- --  PHOS 6.7* --   Liver Function Tests:  Lab 02/12/12 1530 02/12/12 0025  AST -- 16  ALT -- <5  ALKPHOS -- 107  BILITOT -- 0.4  PROT -- 9.3*  ALBUMIN 2.4* 2.6*   CBC:  Lab 02/12/12 0025  WBC 10.9*  NEUTROABS  8.0*  HGB 11.7*  HCT 35.9*  MCV 108.1*  PLT 257   Blood Culture    Component Value Date/Time   SDES BLOOD LEFT ARM 02/12/2012 0010   SPECREQUEST BOTTLES DRAWN AEROBIC AND ANAEROBIC 5CC EA 02/12/2012 0010   CULT  Value: GRAM POSITIVE COCCI IN CLUSTERS Note: Gram Stain Report Called to,Read Back By and Verified With: Kendall Flack RN ON 02/12/2012 AT 7:30P BY WILEJ 02/12/2012 0010   REPTSTATUS PENDING 02/12/2012 0010   Studies/Results: Dg Chest 2 View  02/12/2012  *RADIOLOGY REPORT*  Clinical Data: Fever and cough.  CHEST - 2 VIEW  Comparison: 12/21/2011  Findings: Significant interval improvement of parenchymal consolidation seen previously on the left.  There is some residual infiltration in the left lower lung which  could represent edema or recurrent pneumonia. Shallow inspiration.  Cardiac enlargement with pulmonary vascular congestion.  Slight interstitial changes likely to represent edema.  No blunting of costophrenic angles.  No pneumothorax.  Mediastinal contours appear intact.  IMPRESSION: Significant interval improvement of previous left upper lung consolidation.  Cardiac enlargement with pulmonary vascular congestion and edema.  Infiltration in the left lung may represent edema or recurrent pneumonia.   Original Report Authenticated By: Lucienne Capers, M.D.    Medications:      . abacavir  600 mg Oral Daily  . B-complex with vitamin C  1 tablet Oral Daily  . calcium acetate  1,334 mg Oral TID WC  . Darunavir Ethanolate  800 mg Oral Q breakfast  . doxercalciferol  2 mcg Intravenous Q T,Th,Sa-HD  . feeding supplement (NEPRO CARB STEADY)  237 mL Oral BID BM  . ferric gluconate (FERRLECIT/NULECIT) IV  125 mg Intravenous Q T,Th,Sa-HD  . heparin  5,000 Units Subcutaneous Q8H  . lamiVUDine  50 mg Oral Daily  . lidocaine-prilocaine  1 application Topical 3 times weekly  . linezolid  600 mg Intravenous Q12H  . magic mouthwash  5 mL Oral BID  . midodrine  10 mg Oral TID WC  . multivitamin  1 tablet Oral Daily  . ritonavir  100 mg Oral Q breakfast  . sodium chloride  3 mL Intravenous Q12H  . sodium chloride  3 mL Intravenous Q12H

## 2012-02-13 NOTE — H&P (Addendum)
    [facility] for Infectious Disease  Internal Medicine Teaching Service Attending Note Date: 02/13/2012  Patient name: [name]  Medical record number: [id number]  Date of birth: July 02, 1955    This patient has been seen and discussed with the house staff. Please see their note for complete details. I concur with their findings with the following additions/corrections:  This patient is WELL  Known to me from her prior multiple admissions and I have also begun to follow her in [facility] for treatment of her HIV.  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  She has been residing in SNF and while there has achieved PERFECT VIROLOGICAL suppression of her HIV virus and now has a CD4 count above 300.  She was admitted from SNF with bleeding from her HD site and fevers, cough. After admission cultures drawn she was started on vanco/cefepime for HCAP. Since then blood cultures 2/2 positive for GPCC--> now as time of writing of this note have been ID as Staphylococcus Aureus  We had changed her over to zyvox and cefazolin to cover for VRE, MRSA and have a bactericidal beta lactam on board for MSSA if it is the culprit  ON exam she is mentaing clearly and is comfortable. Her Fistula is bandaged and with blood stains on bandaging. Her lungs are with course breath sounds, abdomen benign.  We discussed code status and after lengthy discussion with pt and her sister pt expressed desire to be FULL CODE and I feel this is appropriate.  Given the ID on the organism, we can change from zyvox back to vancomycin (for MRSA coverage) while continuing ANCEF for possible MSSA.  We have asked VVS to evaluate her Graft for infection. If she has a new HD catheter placed it will also have to be removed and replaced since there is VERY high risk of new HD catheter being seeded by  her ongoing bacteremia.  I will order repeat blood cultures today.  She should also have a TEE since I am very concerned about her risk for now having new endocarditis  We spent greater than 60 minutes with the patient including greater than 50% of time in face to face counsel of the patient and in coordination of their care.    Rhina Brackett Dam 02/13/2012, 1:12 PM

## 2012-02-13 NOTE — Op Note (Signed)
NAME: [name]   MRN: [id number] DOB: October 04, 1955    DATE OF OPERATION: 02/13/2012  PREOP DIAGNOSIS: possibly infected right upper arm AV graft  POSTOP DIAGNOSIS: same  PROCEDURE: revision of right upper arm AV graft with interposition 6 mm PTFE graft  SURGEON: Judeth Cornfield. Scot Dock, MD, FACS  ASSIST: Gerri Lins PA  ANESTHESIA: Gen.   EBL: minimal  INDICATIONS: [name] is a 57 y.o. female who has been having fevers and had drainage from her right upper arm AV graft. Was felt that this might be infected.  FINDINGS: I dissected the graft above and below the area of concern in the graft here was not infected. I tunneled between the 2 areas with an interposition 6 mm graft. Then explored the area of concern. This also was fairly well incorporated. Is no clear evidence of infection.  TECHNIQUE: The patient was taken to the operating room and received a general anesthetic. The right upper extremity was prepped and draped in the usual sterile fashion. The central portion of the graft in the upper arm had been oozing some old blood and there was concern for infection. I elected to explore the graft above and below the area of concern. A longitudinal incision was made over the graft below the area of concern and here the graft was dissected free. The graft was well incorporated. There were a large number of small varicose veins which were controlled with electrocautery. Similarly a incision was made over the graft above the area of concern. Again the graft was dissected free and was well incorporated. Given that there was no evidence of infection in these 2 areas I tunneled between the 2 incisions and a 6 mm graft was tunneled between the 2 incisions. The patient was then heparinized. The graft was spatulated at both ends and the new segment of graft spatulated and they were sewn end-to-end at both ends using continuous 6-0 Prolene suture. Of note prior to completing the anastomosis at both  ends I did pass a #4 from a catheter several times and there was no clot retrieved. At the completion it was a good thrill in the graft. Hemostasis was obtained in these wounds and the wound was closed with a deep layer of 3-0 Vicryl and the skin closed with 4-0 Vicryl. Dermabond was then applied.  Next I entered the area of concern. The graft was incorporated here also. It appears that the bleeding was related to varicosities on top of the graft. There was really no purulent material but an intraoperative culture was obtained. The graft in this area was removed. Hemostasis was obtained. This wound was then closed with interrupted 3-0 nylons. Sterile dressing was applied. The patient tolerated the procedure well and was transferred to the recovery room in stable condition. All needle and sponge counts were correct.  [name], MD, FACS [facility]  DATE OF DICTATION:   02/13/2012

## 2012-02-13 NOTE — Progress Notes (Signed)
    [facility] for Infectious Disease  Staphylococcus aureus bacteremia (SAB) is associated with a high rate of complications and mortality.  Specific aspects of clinical management are critical to optimizing the outcome of patients with SAB.  Therefore, the [facility] Health Antimicrobial Management Team ([facility]AMP) have initiated an intervention aimed at improving the management of SAB at [facility].  To do so, Infectious Diseases Consultants are providing evidence-based recommendations for the management of all patients with SAB.  The specific recommendations for this patient are marked "X" in this document.  Recommended [x  ]  Completed [id number]  Perform follow-up blood cultures (even if the patient is afebrile) to ensure clearance of bacteremia.  WILL LIKELY NEED TO BE REPEATED AGAIN  Recommended [  ]  Completed [date]   Remove vascular catheter, and obtain follow-up cultures after removal of catheter  NA NOW BUT WOULD NOT PLACE NEW HD CATHETER SITE UNTIL BACTEREMIA HAS CLEARED  Recommended Valu.Nieves  ]  Completed [date]   Perform echocardiography to evaluate for endocarditis (transthoracic ECHO is 40-50% sensitive, TEE is > 90% sensitive).*  Please keep in mind, that neither test can definitively EXCLUDE endocarditis, and that should clinical suspicion remain high for endocarditis the patient should then still be treated with an "endocarditis" duration of therapy = 6 weeks  CARDIOLOGY CONSULTED FOR TEE ON MONDAY  Recommended [  ]  Completed [date]   Consult electrophysiologist to evaluate implanted cardiac device (pacemaker, ICD)  NA  Recommended [ X ]  Completed [date]   Ensure source control.  Have all abscesses been drained effectively? Have deep seeded infections (septic joints or osteomyelitis) had appropriate surgical debridement?  pATIENT TO OR TODAY FOR EXPLORATION OF GRAFT  Recommended [ X ]  Completed [date]   Investigate for "metastatic" sites of infection.   Does the patient have  ANY symptom or physical exam finding that would suggest a deeper infection (back or neck pain that may be suggestive of vertebral osteomyelitis or epidural abscess, muscle pain that could be a symptom of pyomyositis)?  Keep in mind that for deep seeded infections MRI imaging with contrast is preferred rather than other often insensitive tests such as plain x-rays, especially early in a patient's presentation.   CHECK DAILY FOR SIGNS SYMPTOMS (PAIN TENDERNESS) AT OTHER SITES IN BODY  Recommended [ X ]  Completed [date]  Change antibiotic regimen to VANCOMYCIN_.  (If on vancomycin, goal trough should be 15 - 20 mg/L)  Valu.Nieves  ]  Estimated duration of IV  Antibiotic therapy: 6 weeks (dependent on diagnostic test results).    [  ]  Consult case management for probable prolonged outpatient intravenous antibiotic therapy.

## 2012-02-13 NOTE — Progress Notes (Signed)
Subjective: [name] was seen and examined at bedside.  She claims to be feeling fine but has an occasional productive cough.  Tmax 104.40F.  She had HD yesterday.  She currently denies any chest pain, shortness of breath, chills, N/V/D, or abdominal pain at this time.  Objective: Vital signs in last 24 hours: Filed Vitals:   02/12/12 1915 02/12/12 2011 02/13/12 0532 02/13/12 0740  BP: 80/41 78/39 83/39    Pulse: 86 83 98   Temp: 98.7 F (37.1 C) 98.2 F (36.8 C) 102.7 F (39.3 C) 98.3 F (36.8 C)  TempSrc: Oral Oral Oral Oral  Resp: 23 24 24    Height:      Weight: 144 lb 6.4 oz (65.5 kg)     SpO2: 98% 93% 92%    Weight change: -6 lb 5.2 oz (-2.868 kg)  Intake/Output Summary (Last 24 hours) at 02/13/12 0755 Last data filed at 02/12/12 1915  Gross per 24 hour  Intake      0 ml  Output   2800 ml  Net  -2800 ml   Vitals reviewed. General: resting in bed, NAD HEENT: PERRLA, EOMI, no scleral icterus Cardiac: RRR, no rubs, murmurs or gallops Pulm: decreased breath sounds LLL, no wheezes, rales, or rhonchi Abd: soft, nontender, nondistended, BS present Ext: warm and well perfused, no pedal edema, right upper arm bandage in place of RAVF.  +2dp b/l Neuro: alert and oriented X3, cranial nerves II-XII grossly intact, strength and sensation to light touch equal in bilateral upper and lower extremities  Lab Results: Basic Metabolic Panel:  Lab [id number] 02/12/12 0025  NA 131* 129*  K 5.0 5.3*  CL 92* 89*  CO2 23 27  GLUCOSE 127* 87  BUN 62* 49*  CREATININE 7.94* 7.05*  CALCIUM 8.9 9.4  MG -- --  PHOS 6.7* --   Liver Function Tests:  Lab 02/12/12 1530 02/12/12 0025  AST -- 16  ALT -- <5  ALKPHOS -- 107  BILITOT -- 0.4  PROT -- 9.3*  ALBUMIN 2.4* 2.6*   CBC:  Lab 02/12/12 0025  WBC 10.9*  NEUTROABS 8.0*  HGB 11.7*  HCT 35.9*  MCV 108.1*  PLT 257   Micro Results: Recent Results (from the past 240 hour(s))  CULTURE, BLOOD (ROUTINE X 2)     Status: Normal  (Preliminary result)   Collection Time   02/12/12 12:05 AM      Component Value Range Status Comment   Specimen Description BLOOD LEFT HAND   Final    Special Requests BOTTLES DRAWN AEROBIC AND ANAEROBIC 5CC EA   Final    Culture  Setup Time 02/12/2012 04:22   Final    Culture     Final    Value: GRAM POSITIVE COCCI IN CLUSTERS     Note: Gram Stain Report Called to,Read Back By and Verified With: Kendall Flack RN ON 02/12/2012 AT 7:30P BY WILEJ   Report Status PENDING   Incomplete   CULTURE, BLOOD (ROUTINE X 2)     Status: Normal (Preliminary result)   Collection Time   02/12/12 12:10 AM      Component Value Range Status Comment   Specimen Description BLOOD LEFT ARM   Final    Special Requests BOTTLES DRAWN AEROBIC AND ANAEROBIC 5CC EA   Final    Culture  Setup Time 02/12/2012 04:22   Final    Culture     Final    Value: GRAM POSITIVE COCCI IN CLUSTERS  Note: Gram Stain Report Called to,Read Back By and Verified With: Kendall Flack RN ON 02/12/2012 AT 7:30P BY Dennard Nip   Report Status PENDING   Incomplete    Studies/Results: Dg Chest 2 View  02/12/2012  *RADIOLOGY REPORT*  Clinical Data: Fever and cough.  CHEST - 2 VIEW  Comparison: 12/21/2011  Findings: Significant interval improvement of parenchymal consolidation seen previously on the left.  There is some residual infiltration in the left lower lung which could represent edema or recurrent pneumonia. Shallow inspiration.  Cardiac enlargement with pulmonary vascular congestion.  Slight interstitial changes likely to represent edema.  No blunting of costophrenic angles.  No pneumothorax.  Mediastinal contours appear intact.  IMPRESSION: Significant interval improvement of previous left upper lung consolidation.  Cardiac enlargement with pulmonary vascular congestion and edema.  Infiltration in the left lung may represent edema or recurrent pneumonia.   Original Report Authenticated By: Lucienne Capers, M.D.    Medications: I have reviewed the  patient's current medications. Scheduled Meds:   . abacavir  600 mg Oral Daily  . B-complex with vitamin C  1 tablet Oral Daily  . calcium acetate  1,334 mg Oral TID WC  . ceFEPime (MAXIPIME) IV  2 g Intravenous Q T,Th,S,Su-1800  . Darunavir Ethanolate  800 mg Oral Q breakfast  . doxercalciferol  2 mcg Intravenous Q T,Th,Sa-HD  . feeding supplement (NEPRO CARB STEADY)  237 mL Oral BID BM  . ferric gluconate (FERRLECIT/NULECIT) IV  125 mg Intravenous Q T,Th,Sa-HD  . heparin  5,000 Units Subcutaneous Q8H  . lamiVUDine  50 mg Oral Daily  . lidocaine-prilocaine  1 application Topical 3 times weekly  . magic mouthwash  5 mL Oral BID  . midodrine  10 mg Oral TID WC  . multivitamin  1 tablet Oral Daily  . ritonavir  100 mg Oral Q breakfast  . sodium chloride  3 mL Intravenous Q12H  . sodium chloride  3 mL Intravenous Q12H  . 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  ?infected graft.  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  T max 104F in ED.  Last admission was for HCAP in December 2013.  On admission: WBC 10.9 and BMP consistent with ESRD. No hypoxia or respiratory distress. Lactate 1.19 and pro-calcitonin 2.85.  PCCM was initially consulted in ED who recommended continuing HCAP treatment along with stress dose steroids as needed for low blood pressure along with Midodrine--they are following  - Blood pressure significantly improved  with hydration with normal saline at 2 L on admission.  No need for pressors or stress to steroid at this point in time. Monitoring closely - d/c Vancomycin and cefepime started in ED--change to zyvox and cefazolin - blood cultures x2 + gram positive cocci - Pending urine studies.  - vascular consulted--Dr. Scot Dock to evaluate  2) Chronic hypotension at baseline.  On Midodrine 10mg  po TID.  No role for pressors at this time.   -continue to monitor  3) ESRD: HD at [facility] on Tuesday, Thursday and  Saturday. Was 7kg above EDW on admission.  Received HD yesterday.   - Right upper extremity access site bleeding stopped.  - Renal following for HD.   3) HIV: Recently saw Dr. Drucilla Schmidt on 02/04/2012 when CD4 count was 320.  - Continue home HIV medications.   4) Systolic Cardiomyopathy: Compensated. Last EF 35-40% in August 2013   5) Anemia--Hb 11.7 on admission, on EPO and Fe per renal.  Tsat 15% and Ferritin 585 on 1/28 Trend cbc  6) Malnutrition--albumin 2.3.  On nepro.   -nutrition  Diet: NPO DVT prophylaxis: Heparin.   Dispo: Disposition is deferred at this time, awaiting improvement of current medical problems. Anticipated discharge in approximately 2-3 day(s).   The patient does have a current PCP (GARG, ANKIT, MD), therefore will be requiring OPC follow-up after discharge.  The patient does not know have transportation limitations that hinder transportation to clinic appointments.  Services Needed at time of discharge: Y = Yes, Blank = No PT:   OT:   RN:   Equipment:   Other:     LOS: 2 days   Jerene Pitch 02/13/2012, 7:55 AM

## 2012-02-13 NOTE — Consult Note (Signed)
[facility] [place] CONSULT NOTE 02/13/2012 DOB: [id number] MRN : [id number]  CC: Bleeding, poss infected graft Referring Physician: DR. Mercy Moore  History of Present Illness: [name] is a 57 y.o. female was admitted with completed a past history including HIV, ESRD, multiple infections including VRE bacteremia, C. difficile colitis, HCAP and multiple other problems as per problem list comes to the ED with cough and fever for past few days.  She  had a RUA AVGG placed by Dr. Donnetta Hutching in August of 2013. We were asked to assess for infection. Pt spiked temp to 102 this am. She is on IV antibiotics and ID following. She states the graft is tender when they take her off dialysis and that it has been bleeding.  Past Medical History  Diagnosis Date  . Human immunodeficiency virus (HIV) disease   . Hypertension   . ESRD (end stage renal disease)     On dialysis since 2010  . Clostridium difficile infection 04/04/10    07/2011 and 08/2011  . Bacteriuria, asymptomatic 04/04/10    Culture grew VRE sensitive to linesolid   . MGUS (monoclonal gammopathy of unknown significance)   . History of bacteremia     MSSA; 05/2011, 08/2011  . Anemia   . Systolic heart failure     08/27/11 EF 35-40%  . CHF (congestive heart failure)   . Cancer     Past Surgical History  Procedure Date  . Av fistula placement   . Insertion of dialysis catheter 12/30/2010    Procedure: INSERTION OF DIALYSIS CATHETER;  Surgeon: Elam Dutch, MD;  Location: [facility];  Service: Vascular;  Laterality: Left;  Insertion of left internal jugular dialysis catheter  . Vascular surgery   . Insertion of dialysis catheter 05/26/2011    Procedure: INSERTION OF DIALYSIS CATHETER;  Surgeon: Conrad [place], MD;  Location: [facility];  Service: Vascular;  Laterality: N/A;  Insertion tunneled dialysis catheter in Right Internal Jugular with 23 cm catheter   . Insertion of dialysis catheter 08/26/2011    Procedure: INSERTION OF  DIALYSIS CATHETER;  Surgeon: Elam Dutch, MD;  [facility];  Service: Vascular;  Laterality: Right;  insertion of Right Femoral vein Dialysis catheter  . Tee without cardioversion 08/27/2011    Procedure: TRANSESOPHAGEAL ECHOCARDIOGRAM (TEE);  Surgeon: Pixie Casino, MD;  Location: [facility];  Service: Cardiovascular;  Laterality: N/A;  . Av fistula placement 09/01/2011    Procedure: INSERTION OF ARTERIOVENOUS (AV) GORE-TEX GRAFT ARM;  Surgeon: Rosetta Posner, MD;  Location: [facility] OR;  Service: Vascular;  Laterality: Right;  Using 69mm x 50  stretch goretex graft     ROS: [x]  Positive  [ ]  Denies    General: [ ]  Weight loss, [ ]  Fever, [ ]  chills Neurologic: [ ]  Dizziness, [ ]  Blackouts, [ ]  Seizure [ ]  Stroke, [ ]  "Mini stroke", [ ]  Slurred speech, [ ]  Temporary blindness; [ ]  weakness in arms or legs, [ ]  Hoarseness Cardiac: [ ]  Chest pain/pressure, [x ] Shortness of breath at rest [ x] Shortness of breath with exertion, [ ]  Atrial fibrillation or irregular heartbeat Vascular: [ ]  Pain in legs with walking, [ ]  Pain in legs at rest, [ ]  Pain in legs at night,  [ ]  Non-healing ulcer, [ ]  Blood clot in vein/DVT,   Pulmonary: [ ]  Home oxygen, [ ]  Productive cough, [ ]  Coughing up blood, [ ]  Asthma,  [ ]  Wheezing Musculoskeletal:  [ ]   Arthritis, [ ]  Low back pain, [ ]  Joint pain Hematologic: [ ]  Easy Bruising, [x ] Anemia; [ ]  Hepatitis [x] HIV Gastrointestinal: [ ]  Blood in stool, [ ]  Gastroesophageal Reflux/heartburn, [ ]  Trouble swallowing Urinary: [x ] chronic Kidney disease, [x ] on HD - [ ]  MWF or [x ] TTHS, [ ]  Burning with urination, [ ]  Difficulty urinating Skin: [ ]  Rashes, [ ]  Wounds Psychological: [ ]  Anxiety, [ ]  Depression  Social History History  Substance Use Topics  . Smoking status: Never Smoker   . Smokeless tobacco: Never Used  . Alcohol Use: No    Family History Family History  Problem Relation Age of Onset  . Alcohol abuse      family h/o  addiction/alcoholism  . Diabetes Cousin     first degree relatives  . Kidney disease Mother   . Kidney disease Maternal Uncle   . Cancer Sister     Allergies  Allergen Reactions  . Penicillins Rash    Unknown reaction    Current Facility-Administered Medications  Medication Dose Route Frequency Provider Last Rate Last Dose  . abacavir (ZIAGEN) tablet 600 mg  600 mg Oral Daily Hadassah Pais, MD   600 mg at 02/12/12 2141  . acetaminophen (TYLENOL) tablet 650 mg  650 mg Oral Q6H PRN John-Adam Bonk, MD   650 mg at 02/13/12 0555  . B-complex with vitamin C tablet 1 tablet  1 tablet Oral Daily Alvia Grove, PA-C   1 tablet at 02/12/12 2142  . calcium acetate (PHOSLO) capsule 1,334 mg  1,334 mg Oral TID WC Alvia Grove, PA-C   1,334 mg at 02/13/12 0913  . ceFAZolin (ANCEF) IVPB 2 g/50 mL premix  2 g Intravenous Q T,Th,Sa-HD Truman Hayward, MD      . Darunavir Ethanolate (PREZISTA) tablet 800 mg  800 mg Oral Q breakfast Hadassah Pais, MD   800 mg at 02/13/12 0914  . doxercalciferol (HECTOROL) injection 2 mcg  2 mcg Intravenous Q T,Th,Sa-HD Alvia Grove, PA-C   2 mcg at 02/12/12 1802  . feeding supplement (NEPRO CARB STEADY) liquid 237 mL  237 mL Oral BID BM Alvia Grove, PA-C      . ferric gluconate (NULECIT) 125 mg in sodium chloride 0.9 % 100 mL IVPB  125 mg Intravenous Q T,Th,Sa-HD Alvia Grove, PA-C   125 mg at 02/12/12 1749  . guaiFENesin (ROBITUSSIN) 100 MG/5ML solution 200 mg  10 mL Oral Q6H PRN Hadassah Pais, MD      . heparin injection 5,000 Units  5,000 Units Subcutaneous Q8H Hadassah Pais, MD   5,000 Units at 02/13/12 0010  . lamiVUDine (EPIVIR) 10 MG/ML solution 50 mg  50 mg Oral Daily Hadassah Pais, MD   50 mg at 02/12/12 2142  . lidocaine-prilocaine (EMLA) cream 1 application  1 application Topical 3 times weekly Hadassah Pais, MD      . linezolid (ZYVOX) IVPB 600 mg  600 mg Intravenous Q12H Jerene Pitch, MD   600 mg at 02/13/12 0921  . magic mouthwash  5 mL Oral BID  Hadassah Pais, MD   5 mL at 02/12/12 2148  . midodrine (PROAMATINE) tablet 10 mg  10 mg Oral TID WC Windy Kalata, MD   10 mg at 02/13/12 0913  . multivitamin (RENA-VIT) tablet 1 tablet  1 tablet Oral Daily Windy Kalata, MD   1 tablet at 02/12/12 2141  . ondansetron (  ZOFRAN) tablet 4 mg  4 mg Oral Q6H PRN Hadassah Pais, MD       Or  . ondansetron [facility]) injection 4 mg  4 mg Intravenous Q6H PRN Hadassah Pais, MD      . oxyCODONE (Oxy IR/ROXICODONE) immediate release tablet 5 mg  5 mg Oral Q6H PRN Hadassah Pais, MD   5 mg at 02/12/12 1900  . ritonavir (NORVIR) tablet 100 mg  100 mg Oral Q breakfast Hadassah Pais, MD   100 mg at 02/13/12 0933  . sodium chloride 0.9 % injection 3 mL  3 mL Intravenous Q12H Hadassah Pais, MD   3 mL at 02/13/12 0920     Imaging: Dg Chest 2 View  02/12/2012  *RADIOLOGY REPORT*  Clinical Data: Fever and cough.  CHEST - 2 VIEW  Comparison: 12/21/2011  Findings: Significant interval improvement of parenchymal consolidation seen previously on the left.  There is some residual infiltration in the left lower lung which could represent edema or recurrent pneumonia. Shallow inspiration.  Cardiac enlargement with pulmonary vascular congestion.  Slight interstitial changes likely to represent edema.  No blunting of costophrenic angles.  No pneumothorax.  Mediastinal contours appear intact.  IMPRESSION: Significant interval improvement of previous left upper lung consolidation.  Cardiac enlargement with pulmonary vascular congestion and edema.  Infiltration in the left lung may represent edema or recurrent pneumonia.   Original Report Authenticated By: Lucienne Capers, M.D.     Significant Diagnostic Studies: CBC Lab Results  Component Value Date   WBC 8.4 02/13/2012   HGB 10.6* 02/13/2012   HCT 33.0* 02/13/2012   MCV 110.0* 02/13/2012   PLT 222 02/13/2012    BMET    Component Value Date/Time   NA 131* 02/12/2012 1530   K 5.0 02/12/2012 1530   CL 92* 02/12/2012 1530   CO2 23  02/12/2012 1530   GLUCOSE 127* 02/12/2012 1530   BUN 62* 02/12/2012 1530   CREATININE 7.94* 02/12/2012 1530   CREATININE 5.23* 10/22/2011 1051   CALCIUM 8.9 02/12/2012 1530   CALCIUM 7.6* 09/14/2007 1321   GFRNONAA 5* 02/12/2012 1530   GFRAA 6* 02/12/2012 1530    COAG Lab Results  Component Value Date   INR 1.60* 05/18/2011   INR 1.32 12/24/2010   INR 1.50* 09/11/2010   No results found for this basename: PTT     Physical Examination BP Readings from Last 3 Encounters:  02/13/12 85/53  02/04/12 135/83  12/24/11 115/67   Temp Readings from Last 3 Encounters:  02/13/12 98.4 F (36.9 C) Oral  02/04/12 98.8 F (37.1 C) Oral  12/24/11 98.8 F (37.1 C)    SpO2 Readings from Last 3 Encounters:  02/13/12 91%  12/24/11 94%  09/11/11 93%   Pulse Readings from Last 3 Encounters:  02/13/12 80  02/04/12 91  12/24/11 86    General:  WDWN in NAD HENT: WNL Eyes: Pupils equal Pulmonary: normal non-labored breathing  Cardiac: RRR,  Skin: no rashes, ulcers noted Vascular Exam/Pulses: 2+ right radial pulse AVGG not boggy or tender; minimal cellulitis in RUA around fistula Extremities without ischemic changes, no Gangrene ,  ; no open wounds;  Musculoskeletal: no muscle wasting or atrophy  Neurologic: A&O X 3; Appropriate Affect ;  SENSATION: normal; MOTOR FUNCTION: Pt has good and equal strength in all extremities - 5/5 Speech is fluent/normal  Non-Invasive Vascular Imaging: none  ASSESSMENT/PLAN: [name] is a 57 y.o. female with sepsis and bleeding in the areas  of stick sites, mild erythema around graft in RUA which is not tender. Will make pt NPO and have Dr. Scot Dock evaluate  Agree with above. Plan exploration of the graft. If it is infected, will need to remove and placement of catheter after 48 hours of antibiotics (tentatively plan on Monday).  Gae Gallop Beeper [id number] 02/13/2012

## 2012-02-13 NOTE — Transfer of Care (Signed)
Immediate Anesthesia Transfer of Care Note  Patient: [name]  Procedure(s) Performed: Procedure(s) (LRB) with comments: THROMBECTOMY AND REVISION OF ARTERIOVENTOUS (AV) GORETEX  GRAFT (Right)  Patient Location: PACU  Anesthesia Type:General  Level of Consciousness: awake, alert  and oriented  Airway & Oxygen Therapy: Patient Spontanous Breathing and Patient connected to face mask oxygen  Post-op Assessment: Report given to PACU RN, Post -op Vital signs reviewed and stable and Patient moving all extremities  Post vital signs: Reviewed and stable  Complications: No apparent anesthesia complications

## 2012-02-13 NOTE — Anesthesia Postprocedure Evaluation (Signed)
  Anesthesia Post-op Note  Patient: [name]  Procedure(s) Performed: Procedure(s) (LRB) with comments: THROMBECTOMY AND REVISION OF ARTERIOVENTOUS (AV) GORETEX  GRAFT (Right)  Patient Location: PACU  Anesthesia Type:General  Level of Consciousness: awake and sedated  Airway and Oxygen Therapy: Patient Spontanous Breathing  Post-op Pain: mild  Post-op Assessment: Post-op Vital signs reviewed  Post-op Vital Signs: stable  Complications: No apparent anesthesia complications

## 2012-02-13 NOTE — Anesthesia Preprocedure Evaluation (Addendum)
Anesthesia Evaluation  Patient identified by MRN, date of birth, ID band Patient awake    Reviewed: Allergy & Precautions, H&P , NPO status , Patient's Chart, lab work & pertinent test results  History of Anesthesia Complications (+) AWARENESS UNDER ANESTHESIA  Airway Mallampati: II TM Distance: >3 FB Neck ROM: full    Dental  (+) Edentulous Upper and Edentulous Lower   Pulmonary pneumonia -, resolved,          Cardiovascular Exercise Tolerance: Poor hypertension, Pt. on medications +CHF Rhythm:regular Rate:Normal     Neuro/Psych    GI/Hepatic negative GI ROS,   Endo/Other  negative endocrine ROS  Renal/GU Dialysis and ESRFRenal disease  negative genitourinary   Musculoskeletal   Abdominal Normal abdominal exam  (+)   Peds  Hematology  (+) anemia , HIV,   Anesthesia Other Findings   Reproductive/Obstetrics                          Anesthesia Physical Anesthesia Plan  ASA: IV  Anesthesia Plan: General   Post-op Pain Management:    Induction: Intravenous  Airway Management Planned: LMA and Oral ETT  Additional Equipment:   Intra-op Plan:   Post-operative Plan: Extubation in OR  Informed Consent: I have reviewed the patients History and Physical, chart, labs and discussed the procedure including the risks, benefits and alternatives for the proposed anesthesia with the patient or authorized representative who has indicated his/her understanding and acceptance.     Plan Discussed with: CRNA, Anesthesiologist and Surgeon  Anesthesia Plan Comments:         Anesthesia Quick Evaluation

## 2012-02-13 NOTE — Preoperative (Signed)
Beta Blockers   Reason not to administer Beta Blockers:Not Applicable 

## 2012-02-13 NOTE — Progress Notes (Addendum)
ANTIBIOTIC CONSULT NOTE - INITIAL  Pharmacy Consult for Ancef/Vancomycin Indication: bacteremia with pna  Allergies  Allergen Reactions  . Penicillins Rash    Unknown reaction    Patient Measurements: Height: 5\' 4"  (162.6 cm) Weight: 144 lb 6.4 oz (65.5 kg) IBW/kg (Calculated) : 54.7   Vital Signs: Temp: 98.4 F (36.9 C) (02/07 0839) Temp src: Oral (02/07 0839) BP: 85/53 mmHg (02/07 0839) Pulse Rate: 80  (02/07 0839) Intake/Output from previous day: 02/06 0701 - 02/07 0700 In: 2500 [I.V.:2500] Out: 2800  Intake/Output from this shift:    Labs:  Basename 02/12/12 1530 02/12/12 0025  WBC -- 10.9*  HGB -- 11.7*  PLT -- 257  LABCREA -- --  CREATININE 7.94* 7.05*   Estimated Creatinine Clearance: 6.9 ml/min (by C-G formula based on Cr of 7.94). No results found for this basename: VANCOTROUGH:2,VANCOPEAK:2,VANCORANDOM:2,GENTTROUGH:2,GENTPEAK:2,GENTRANDOM:2,TOBRATROUGH:2,TOBRAPEAK:2,TOBRARND:2,AMIKACINPEAK:2,AMIKACINTROU:2,AMIKACIN:2, in the last 72 hours   Microbiology: Recent Results (from the past 720 hour(s))  CULTURE, BLOOD (ROUTINE X 2)     Status: Normal (Preliminary result)   Collection Time   02/12/12 12:05 AM      Component Value Range Status Comment   Specimen Description BLOOD LEFT HAND   Final    Special Requests BOTTLES DRAWN AEROBIC AND ANAEROBIC 5CC EA   Final    Culture  Setup Time 02/12/2012 04:22   Final    Culture     Final    Value: GRAM POSITIVE COCCI IN CLUSTERS     Note: Gram Stain Report Called to,Read Back By and Verified With: Kendall Flack RN ON 02/12/2012 AT 7:30P BY WILEJ   Report Status PENDING   Incomplete   CULTURE, BLOOD (ROUTINE X 2)     Status: Normal (Preliminary result)   Collection Time   02/12/12 12:10 AM      Component Value Range Status Comment   Specimen Description BLOOD LEFT ARM   Final    Special Requests BOTTLES DRAWN AEROBIC AND ANAEROBIC 5CC EA   Final    Culture  Setup Time 02/12/2012 04:22   Final    Culture     Final     Value: GRAM POSITIVE COCCI IN CLUSTERS     Note: Gram Stain Report Called to,Read Back By and Verified With: Kendall Flack RN ON 02/12/2012 AT 19:30P BY WILEJ   Report Status PENDING   Incomplete     Medical History: Past Medical History  Diagnosis Date  . Human immunodeficiency virus (HIV) disease   . Hypertension   . ESRD (end stage renal disease)     On dialysis since 2010  . Clostridium difficile infection 04/04/10    07/2011 and 08/2011  . Bacteriuria, asymptomatic 04/04/10    Culture grew VRE sensitive to linesolid   . MGUS (monoclonal gammopathy of unknown significance)   . History of bacteremia     MSSA; 05/2011, 08/2011  . Anemia   . Systolic heart failure     08/27/11 EF 35-40%  . CHF (congestive heart failure)   . Cancer     Medications:  Scheduled:    . abacavir  600 mg Oral Daily  . B-complex with vitamin C  1 tablet Oral Daily  . calcium acetate  1,334 mg Oral TID WC  . Darunavir Ethanolate  800 mg Oral Q breakfast  . doxercalciferol  2 mcg Intravenous Q T,Th,Sa-HD  . feeding supplement (NEPRO CARB STEADY)  237 mL Oral BID BM  . ferric gluconate (FERRLECIT/NULECIT) IV  125 mg Intravenous Q T,Th,Sa-HD  .  heparin  5,000 Units Subcutaneous Q8H  . lamiVUDine  50 mg Oral Daily  . lidocaine-prilocaine  1 application Topical 3 times weekly  . linezolid  600 mg Intravenous Q12H  . magic mouthwash  5 mL Oral BID  . midodrine  10 mg Oral TID WC  . multivitamin  1 tablet Oral Daily  . ritonavir  100 mg Oral Q breakfast  . sodium chloride  3 mL Intravenous Q12H  . sodium chloride  3 mL Intravenous Q12H  . [DISCONTINUED] aspirin  81 mg Oral Daily  . [DISCONTINUED] aspirin  81 mg Oral Daily  . [DISCONTINUED] b complex vitamins  1 tablet Oral Daily  . [DISCONTINUED] ceFEPime (MAXIPIME) IV  2 g Intravenous Q T,Th,S,Su-1800  . [DISCONTINUED] folic acid  [id number] mcg Oral Daily  . [DISCONTINUED] midodrine  10 mg Oral BID  . [DISCONTINUED] midodrine  10 mg Oral BID WC  .  [DISCONTINUED] vancomycin  750 mg Intravenous Q T,Th,Sa-HD   Infusions:   Assessment: 57 y/o ESRD patient admitted with right arm pain now found to have pneumonia with positive blood cx 2/2 requiring zyvox d/t h/o VRE. Adding ancef today for broad spectrum coverage, was previously on cefepime with last dose at midnight.  Plan:  Cefazolin 2g IV QHD, next dose tomorrow. Will continue to follow. To add back vancomycin today after blood cx grew staph with sensitivities pending. Vancomycin 750mg  qhd, to get extra session today.  Davonna Belling, PharmD, BCPS Pager [phone] 02/13/2012,9:38 AM

## 2012-02-13 NOTE — Progress Notes (Signed)
Utilization completed.

## 2012-02-14 ENCOUNTER — Inpatient Hospital Stay (HOSPITAL_COMMUNITY): Payer: Medicaid Other

## 2012-02-14 LAB — CULTURE, BLOOD (ROUTINE X 2)

## 2012-02-14 LAB — RENAL FUNCTION PANEL
Albumin: 2.2 g/dL — ABNORMAL LOW (ref 3.5–5.2)
Albumin: 2.2 g/dL — ABNORMAL LOW (ref 3.5–5.2)
BUN: 43 mg/dL — ABNORMAL HIGH (ref 6–23)
BUN: 44 mg/dL — ABNORMAL HIGH (ref 6–23)
CO2: 26 mEq/L (ref 19–32)
CO2: 24 mEq/L (ref 19–32)
Calcium: 9.2 mg/dL (ref 8.4–10.5)
Calcium: 9.1 mg/dL (ref 8.4–10.5)
Chloride: 93 mEq/L — ABNORMAL LOW (ref 96–112)
Chloride: 93 mEq/L — ABNORMAL LOW (ref 96–112)
Creatinine, Ser: 6.74 mg/dL — ABNORMAL HIGH (ref 0.50–1.10)
Creatinine, Ser: 6.88 mg/dL — ABNORMAL HIGH (ref 0.50–1.10)
GFR calc Af Amer: 7 mL/min — ABNORMAL LOW (ref >90)
GFR calc Af Amer: 7 mL/min — ABNORMAL LOW (ref >90)
GFR calc non Af Amer: 6 mL/min — ABNORMAL LOW (ref >90)
GFR calc non Af Amer: 6 mL/min — ABNORMAL LOW (ref >90)
Glucose, Bld: 92 mg/dL (ref 70–99)
Glucose, Bld: 93 mg/dL (ref 70–99)
Phosphorus: 6.8 mg/dL — ABNORMAL HIGH (ref 2.3–4.6)
Phosphorus: 6.7 mg/dL — ABNORMAL HIGH (ref 2.3–4.6)
Potassium: 4.4 mEq/L (ref 3.5–5.1)
Potassium: 4.8 mEq/L (ref 3.5–5.1)
Sodium: 131 mEq/L — ABNORMAL LOW (ref 135–145)
Sodium: 132 mEq/L — ABNORMAL LOW (ref 135–145)

## 2012-02-14 LAB — CBC
HCT: 29 % — ABNORMAL LOW (ref 36.0–46.0)
HCT: 29 % — ABNORMAL LOW (ref 36.0–46.0)
Hemoglobin: 9 g/dL — ABNORMAL LOW (ref 12.0–15.0)
Hemoglobin: 9.3 g/dL — ABNORMAL LOW (ref 12.0–15.0)
MCH: 33.8 pg (ref 26.0–34.0)
MCH: 34.7 pg — ABNORMAL HIGH (ref 26.0–34.0)
MCHC: 31 g/dL (ref 30.0–36.0)
MCHC: 32.1 g/dL (ref 30.0–36.0)
MCV: 109 fL — ABNORMAL HIGH (ref 78.0–100.0)
MCV: 108.2 fL — ABNORMAL HIGH (ref 78.0–100.0)
Platelets: 210 10*3/uL (ref 150–400)
Platelets: 235 10*3/uL (ref 150–400)
RBC: 2.66 MIL/uL — ABNORMAL LOW (ref 3.87–5.11)
RBC: 2.68 MIL/uL — ABNORMAL LOW (ref 3.87–5.11)
RDW: 14.8 % (ref 11.5–15.5)
RDW: 14.7 % (ref 11.5–15.5)
WBC: 7.5 10*3/uL (ref 4.0–10.5)
WBC: 8.3 10*3/uL (ref 4.0–10.5)

## 2012-02-14 LAB — MAGNESIUM: Magnesium: 2 mg/dL (ref 1.5–2.5)

## 2012-02-14 LAB — LACTIC ACID, PLASMA: Lactic Acid, Venous: 1 mmol/L (ref 0.5–2.2)

## 2012-02-14 MED ORDER — DOXERCALCIFEROL 4 MCG/2ML IV SOLN
INTRAVENOUS | Status: AC
  Administered 2012-02-14: 4 ug via INTRAVENOUS
  Filled 2012-02-14: qty 2

## 2012-02-14 MED ORDER — DARBEPOETIN ALFA-POLYSORBATE 100 MCG/0.5ML IJ SOLN
100.0000 ug | INTRAMUSCULAR | Status: DC
  Administered 2012-02-14: 100 ug via INTRAVENOUS

## 2012-02-14 MED ORDER — BOOST / RESOURCE BREEZE PO LIQD
1.0000 | Freq: Three times a day (TID) | ORAL | Status: DC
  Administered 2012-02-14 – 2012-02-17 (×4): 1 via ORAL

## 2012-02-14 MED ORDER — DARBEPOETIN ALFA-POLYSORBATE 100 MCG/0.5ML IJ SOLN
INTRAMUSCULAR | Status: AC
  Administered 2012-02-14: 100 ug via INTRAVENOUS
  Filled 2012-02-14: qty 0.5

## 2012-02-14 NOTE — Progress Notes (Signed)
INITIAL NUTRITION ASSESSMENT  DOCUMENTATION CODES Per approved criteria  -Not Applicable   INTERVENTION: 1. D/c nepro, pt wont drink 2. Resource Breeze po daily, each supplement provides 250 kcal and 9 grams of protein. 3. RD will continue to follow    NUTRITION DIAGNOSIS: Inadequate oral intake related to poor appetite as evidenced by 25% meal completion.   Goal: PO intake to meet >/=90% estimated nutrition needs   Monitor:  PO intake, weight trends, I/O"s, labs   Reason for Assessment: consult   57 y.o. female  Admitting Dx: MRSA bacteremia  ASSESSMENT: Pt admitted with bleeding from HD graft site. Pt with hx of HIV, ESRD on HD, and at previous admissions was dx with malnutrition.  Pt states appetite has been good at SNF, eating all meals. Not on any nutrition supplements. Currently has orders for Nepro shakes- pt will no drink these. Willing to drink [employer]. Pt with weight trending up, EDW has been increased recently per Nephrology notes.    Height: Ht Readings from Last 1 Encounters:  02/11/12 5\' 4"  (1.626 m)    Weight: Wt Readings from Last 1 Encounters:  02/14/12 139 lb 5.3 oz (63.2 kg)  EDW: 64.5 kg per Nephrology  Ideal Body Weight: 120 lbs   % Ideal Body Weight: 115%  Wt Readings from Last 10 Encounters:  02/14/12 139 lb 5.3 oz (63.2 kg)  02/14/12 139 lb 5.3 oz (63.2 kg)  02/04/12 158 lb (71.668 kg)  12/23/11 141 lb 1.5 oz (64 kg)  10/22/11 129 lb 8 oz (58.741 kg)  09/11/11 119 lb (53.978 kg)  09/11/11 119 lb 1.6 oz (54.023 kg)  09/02/11 110 lb 14.3 oz (50.3 kg)  09/02/11 110 lb 14.3 oz (50.3 kg)  09/02/11 110 lb 14.3 oz (50.3 kg)    Usual Body Weight: 110 lbs   % Usual Body Weight: 126%  BMI:  Body mass index is 23.9 kg/(m^2). WNL  Estimated Nutritional Needs: Kcal: 1900-2100 Protein: 75-90 gm  Fluid: 1.2 L/day  Skin: intact  Diet Order: Renal  EDUCATION NEEDS: -No education needs identified at this time   Intake/Output  Summary (Last 24 hours) at 02/14/12 1352 Last data filed at 02/14/12 1337  Gross per 24 hour  Intake    800 ml  Output   1800 ml  Net  -1000 ml    Last BM: PTA   Labs:   Recent Labs Lab 02/13/12 0934 02/14/12 0614 02/14/12 0857  NA 134* 131* 132*  K 3.9 4.4 4.8  CL 94* 93* 93*  CO2 25 26 24   BUN 33* 43* 44*  CREATININE 5.67* 6.74* 6.88*  CALCIUM 9.3 9.2 9.1  MG  --  2.0  --   PHOS 5.2* 6.8* 6.7*  GLUCOSE 164* 92 93    CBG (last 3)  No results found for this basename: GLUCAP,  in the last 72 hours  Scheduled Meds: . abacavir  600 mg Oral Daily  . B-complex with vitamin C  1 tablet Oral Daily  . calcium acetate  1,334 mg Oral TID WC  . darbepoetin (ARANESP) injection - DIALYSIS  100 mcg Intravenous Q Sat-HD  . Darunavir Ethanolate  800 mg Oral Q breakfast  . doxercalciferol  2 mcg Intravenous Q T,Th,Sa-HD  . feeding supplement (NEPRO CARB STEADY)  237 mL Oral BID BM  . ferric gluconate (FERRLECIT/NULECIT) IV  125 mg Intravenous Q T,Th,Sa-HD  . heparin  5,000 Units Subcutaneous Q8H  . lamiVUDine  50 mg Oral Daily  .  lidocaine-prilocaine  1 application Topical 3 times weekly  . magic mouthwash  5 mL Oral BID  . midodrine  10 mg Oral TID WC  . multivitamin  1 tablet Oral Daily  . ritonavir  100 mg Oral Q breakfast  . sodium chloride  3 mL Intravenous Q12H  . vancomycin  750 mg Intravenous Q T,Th,Sa-HD    Continuous Infusions:   Past Medical History  Diagnosis Date  . Human immunodeficiency virus (HIV) disease   . Hypertension   . ESRD (end stage renal disease)     On dialysis since 2010  . Clostridium difficile infection 04/04/10    07/2011 and 08/2011  . Bacteriuria, asymptomatic 04/04/10    Culture grew VRE sensitive to linesolid   . MGUS (monoclonal gammopathy of unknown significance)   . History of bacteremia     MSSA; 05/2011, 08/2011  . Anemia   . Systolic heart failure     08/27/11 EF 35-40%  . CHF (congestive heart failure)   . Cancer     Past  Surgical History  Procedure Laterality Date  . Av fistula placement    . Insertion of dialysis catheter  12/30/2010    Procedure: INSERTION OF DIALYSIS CATHETER;  Surgeon: Elam Dutch, MD;  Location: [place];  Service: Vascular;  Laterality: Left;  Insertion of left internal jugular dialysis catheter  . Vascular surgery    . Insertion of dialysis catheter  05/26/2011    Procedure: INSERTION OF DIALYSIS CATHETER;  Surgeon: Conrad Longford, MD;  Location: [place];  Service: Vascular;  Laterality: N/A;  Insertion tunneled dialysis catheter in Right Internal Jugular with 23 cm catheter   . Insertion of dialysis catheter  08/26/2011    Procedure: INSERTION OF DIALYSIS CATHETER;  Surgeon: Elam Dutch, MD;  Location[facility];  Service: Vascular;  Laterality: Right;  insertion of Right Femoral vein Dialysis catheter  . Tee without cardioversion  08/27/2011    Procedure: TRANSESOPHAGEAL ECHOCARDIOGRAM (TEE);  Surgeon: Pixie Casino, MD;  Location: [place];  Service: Cardiovascular;  Laterality: N/A;  . Av fistula placement  09/01/2011    Procedure: INSERTION OF ARTERIOVENOUS (AV) GORE-TEX GRAFT ARM;  Surgeon: Rosetta Posner, MD;  Location: [place];  Service: Vascular;  Laterality: Right;  Using 76mm x 50  stretch goretex graft    Orson Slick RD, LDN Pager [phone] After Hours pager [phone]

## 2012-02-14 NOTE — Progress Notes (Signed)
VASCULAR PROGRESS NOTE  SUBJECTIVE: No complaints.  PHYSICAL EXAM: Filed Vitals:   02/14/12 1000 02/14/12 1030 02/14/12 1100 02/14/12 1130  BP: 92/42 92/49 83/23  102/41  Pulse: 55 62 70 58  Temp:      TempSrc:      Resp:      Height:      Weight:      SpO2:       Her right upper arm AV graft has a bruit.  LABS: Lab Results  Component Value Date   WBC 8.3 02/14/2012   HGB 9.3* 02/14/2012   HCT 29.0* 02/14/2012   MCV 108.2* 02/14/2012   PLT 235 02/14/2012   Lab Results  Component Value Date   CREATININE 6.88* 02/14/2012   Lab Results  Component Value Date   INR 1.60* 05/18/2011   CBG (last 3)  No results found for this basename: GLUCAP,  in the last 72 hours  Principal Problem:   HCAP (healthcare-associated pneumonia) Active Problems:   HIV INFECTION   ESRD (end stage renal disease)  Intraoperative culture: No organisms seen on Gram stain. Culture pending.  ASSESSMENT AND PLAN: 1. 1 Day Post-Op s/p: revision of right upper arm AV graft. There was no clear evidence of infection and the graft was revised. Culture is pending. 2. The graft is being used at this morning for dialysis.  Gae Gallop [id number] 02/14/2012

## 2012-02-14 NOTE — Progress Notes (Signed)
Subjective: [name] was seen and examined this morning.  She had HD today.  She claims to be feeling fine and is concerned about being in the hospital this long without having any of her belongings from her home.  Tmax 102.43F at 5am yesteday.  She did not have HD yesterday due to POD#1 s/p revision of right upper arm AV graft.  She currently denies any chest pain, shortness of breath, chills, N/V/D, or abdominal pain at this time.  Objective: Vital signs in last 24 hours: Filed Vitals:   02/14/12 1000 02/14/12 1030 02/14/12 1100 02/14/12 1130  BP: 92/42 92/49 83/23  102/41  Pulse: 55 62 70 58  Temp:      TempSrc:      Resp:      Height:      Weight:      SpO2:       Weight change: -7 lb 11.5 oz (-3.5 kg)  Intake/Output Summary (Last 24 hours) at 02/14/12 1150 Last data filed at 02/13/12 1730  Gross per 24 hour  Intake    800 ml  Output    100 ml  Net    700 ml   Vitals reviewed. General: resting in bed, NAD HEENT: PERRLA, EOMI, no scleral icterus Cardiac: RRR, 3/6 SEM Pulm: decreased breath sounds LLL, no wheezes, rales, or rhonchi Abd: soft, nontender, nondistended, BS present Ext: warm and well perfused, no pedal edema, right upper arm bandage in place of RAVF.  +2dp b/l Neuro: alert and oriented X3, cranial nerves II-XII grossly intact, strength and sensation to light touch equal in bilateral upper and lower extremities  Lab Results: Basic Metabolic Panel:  Recent Labs Lab 02/14/12 0614 02/14/12 0857  NA 131* 132*  K 4.4 4.8  CL 93* 93*  CO2 26 24  GLUCOSE 92 93  BUN 43* 44*  CREATININE 6.74* 6.88*  CALCIUM 9.2 9.1  MG 2.0  --   PHOS 6.8* 6.7*   Liver Function Tests:  Recent Labs Lab 02/12/12 0025  02/14/12 0614 02/14/12 0857  AST 16  --   --   --   ALT <5  --   --   --   ALKPHOS 107  --   --   --   BILITOT 0.4  --   --   --   PROT 9.3*  --   --   --   ALBUMIN 2.6*  < > 2.2* 2.2*  < > = values in this interval not displayed. CBC:  Recent  Labs Lab 02/12/12 0025  02/14/12 0614 02/14/12 0857  WBC 10.9*  < > 7.5 8.3  NEUTROABS 8.0*  --   --   --   HGB 11.7*  < > 9.0* 9.3*  HCT 35.9*  < > 29.0* 29.0*  MCV 108.1*  < > 109.0* 108.2*  PLT 257  < > 210 235  < > = values in this interval not displayed. Micro Results: Recent Results (from the past 240 hour(s))  CULTURE, BLOOD (ROUTINE X 2)     Status: None   Collection Time    02/12/12 12:05 AM      Result Value Range Status   Specimen Description BLOOD LEFT HAND   Final   Special Requests BOTTLES DRAWN AEROBIC AND ANAEROBIC 5CC EA   Final   Culture  Setup Time 02/12/2012 04:22   Final   Culture     Final   Value: METHICILLIN RESISTANT STAPHYLOCOCCUS AUREUS     Note: RIFAMPIN AND  GENTAMICIN SHOULD NOT BE USED AS SINGLE DRUGS FOR TREATMENT OF STAPH INFECTIONS. CRITICAL RESULT CALLED TO, READ BACK BY AND VERIFIED WITH: ASHLEY TONEY 02/13/12 1400 BY SMITHERSJ This organism DOES NOT demonstrate inducible Clindamycin      resistance in vitro.     STAPHYLOCOCCUS AUREUS     Note: Gram Stain Report Called to,Read Back By and Verified With: Kendall Flack RN ON 02/12/2012 AT 7:30P BY WILEJ   Report Status 02/14/2012 FINAL   Final   Organism ID, Bacteria STAPHYLOCOCCUS AUREUS   Final  CULTURE, BLOOD (ROUTINE X 2)     Status: None   Collection Time    02/12/12 12:10 AM      Result Value Range Status   Specimen Description BLOOD LEFT ARM   Final   Special Requests BOTTLES DRAWN AEROBIC AND ANAEROBIC 5CC EA   Final   Culture  Setup Time 02/12/2012 04:22   Final   Culture     Final   Value: METHICILLIN RESISTANT STAPHYLOCOCCUS AUREUS     Note: SUSCEPTIBILITIES PERFORMED ON PREVIOUS CULTURE WITHIN THE LAST 5 DAYS.     Note: Gram Stain Report Called to,Read Back By and Verified With: Kendall Flack RN ON 02/12/2012 AT 7:30P BY WILEJ   Report Status 02/14/2012 FINAL   Final  WOUND CULTURE     Status: None   Collection Time    02/13/12  4:11 PM      Result Value Range Status   Specimen  Description WOUND RIGHT ARM   Final   Special Requests SWAB OF PERI GRAFT FLUID, PT ON ZYVOS   Final   Gram Stain     Final   Value: RARE WBC PRESENT, PREDOMINANTLY PMN     RARE SQUAMOUS EPITHELIAL CELLS PRESENT     NO ORGANISMS SEEN   Culture NO GROWTH 1 DAY   Final   Report Status PENDING   Incomplete  ANAEROBIC CULTURE     Status: None   Collection Time    02/13/12  4:11 PM      Result Value Range Status   Specimen Description WOUND RIGHT ARM   Final   Special Requests SWAB OF PERI GRAFT FLUID, PT ON ZYVOS   Final   Gram Stain PENDING   Incomplete   Culture     Final   Value: NO ANAEROBES ISOLATED; CULTURE IN PROGRESS FOR 5 DAYS   Report Status PENDING   Incomplete   Medications: I have reviewed the patient's current medications. Scheduled Meds: . abacavir  600 mg Oral Daily  . B-complex with vitamin C  1 tablet Oral Daily  . calcium acetate  1,334 mg Oral TID WC  . darbepoetin (ARANESP) injection - DIALYSIS  100 mcg Intravenous Q Sat-HD  . Darunavir Ethanolate  800 mg Oral Q breakfast  . doxercalciferol  2 mcg Intravenous Q T,Th,Sa-HD  . feeding supplement (NEPRO CARB STEADY)  237 mL Oral BID BM  . ferric gluconate (FERRLECIT/NULECIT) IV  125 mg Intravenous Q T,Th,Sa-HD  . heparin  5,000 Units Subcutaneous Q8H  . lamiVUDine  50 mg Oral Daily  . lidocaine-prilocaine  1 application Topical 3 times weekly  . magic mouthwash  5 mL Oral BID  . midodrine  10 mg Oral TID WC  . multivitamin  1 tablet Oral Daily  . ritonavir  100 mg Oral Q breakfast  . sodium chloride  3 mL Intravenous Q12H  . 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  Hx VRE.  Was initially started on Vancomycin and cefepime, changed to Zyvox and Ancef, and now back to Vancomycin.  Concern for possible endocarditis? TEE 08/2011: negative for endocarditis.   -f/u repeat blood cx x2 2/7--NGTD -Continue vancomycin for recommended 6 weeks total--day 3 -TEE Monday -check daily for "metastatic" site of infection  2) Sepsis--secondary to MRSA Bacteremia, ?HCAP-- LLL PNA (per xray), and ?infected graft in setting of ESRD and HIV disease.  POD#1 s/p revision of RUA AV graft--graft revised, no clear evidence of infection.  Hx of VRE.  T max 104F in ED.   Afebrile, leukocytosis resolved, and BP stable.  Lactate down to 1.0 2/8 and pro-calcitonin 2.85.   - PCCM following.   - monitor BP--hx of chronic hypotension, on Midodrine.  No need for pressors or stress steroid at this point in time. Monitoring closely - d/c zyvox and cefazolin, continue Vancomycin day 3--total 6 weeks - blood cultures x2 2/7--NGTD - av graft wound cultures pending--NGTD - repeat CXR after HD - vascular following, OR 2/7 for graft revision, using graft for HD today  3) Chronic hypotension at baseline.  On Midodrine 10mg  po TID.  No role for pressors at this time.   -BP Stable, continue to monitor  4) ESRD: HD at [facility] on T,Th,Sat. Was 7kg above EDW on admission.  Received last HD 2/6.  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   - HD today  - Renal following for HD.   5) HIV: Recently saw Dr. Drucilla Schmidt on 02/04/2012 when CD4 count was 320.  - Continue home HIV medications.   6) Systolic Cardiomyopathy: Compensated. Last EF 35-40% in August 2013   7) Anemia--Hb 11.7 on admission, on EPO and Fe per renal.  Tsat 15% and Ferritin  585 on 1/28 per renal  Recent Labs Lab 02/13/12 0934 02/14/12 0614 02/14/12 0857  HGB 10.6* 9.0* 9.3*  HCT 33.0* 29.0* 29.0*  WBC 8.4 7.5 8.3  PLT 222 210 235   -Trend cbc -resume Aranesp per renal  8) Malnutrition--albumin 2.3.  On nepro.   -nutrition consult  9) Metabolic bone disease--PTH 180.9 on 1/28.   -continue Phoslo and Hectorol per renal  Diet: renal diet DVT prophylaxis: Heparin.   Dispo: Disposition is deferred at this time, awaiting improvement of current medical problems. Anticipated discharge in approximately 2-3 day(s).   The patient does have a current PCP (GARG, ANKIT, MD), therefore will be requiring OPC follow-up after discharge.  The patient does not know have transportation limitations that hinder transportation to clinic appointments.  Services Needed at time of discharge: Y = Yes, Blank = No PT:   OT:   RN:   Equipment:   Other:     LOS: 3 days   Jerene Pitch 02/14/2012, 11:50 AM

## 2012-02-14 NOTE — Progress Notes (Signed)
[facility] Progress Note  Subjective:   No c/o  Objective Filed Vitals:   02/13/12 1753 02/13/12 1846 02/13/12 2149 02/14/12 0544  BP:  115/65 97/63 115/62  Pulse: 70 64 55 61  Temp:  98.1 F (36.7 C) 98.4 F (36.9 C) 98 F (36.7 C)  TempSrc:  Oral Oral Oral  Resp: 17 18 16 16   Height:      Weight:   65.3 kg (143 lb 15.4 oz)   SpO2: 93% 92% 96% 96%   Physical Exam waiting to be cannulated in dialysis General: NAD Heart:RRR GR 3/6 SEM, Gr 2/6 dia M Lungs: diffuse crackles diminished bs Abdomen: soft NT liver down 4 cm Extremities: tr LE edema Dialysis Access: right upper AVGG mild edema with new medial section. Bruit strongest distally  Dialysis Orders: [facility] .  EDW 64.5 HD Bath 2K/2.25Ca Time 4:00 Heparin 5900. Access RUA AGG BFR 400 DFR A1.5  Hectorol 2 mcg IV/HD Epogen 18,600 Units IV/HD Venofer 50 mg IV weekly  Several recent EDW increases. Ongoing complaints of cramping.   Assessment/Plan:  1. Gram + cocci bacteremia with hx multiple episodes of various bacteremias over the years; ? HCAP - Afebrile Was on vanc and cefepime; changed to zyvox (due to VRE hx); now on Vancomycin. Would repeat CXR after Saturday dialysis; Access evaluated Saturday due to oozing. Had revision, though not likely infected. Cultures done, but already on antibiotics 2. Adrenal Insufficiency - Stress dose steroids per admit. 3. S/p revision of right upper AVGG - Dr. Scot Dock. Felt she may have some central stenosis; Dr. Scot Dock has instructed staff where to cannulate today. 4. ESRD - TTS  [place]. - extra HD not done Friday due to surgery.K 4.4 5. Hypotension/volume - Hx of hypotension at baseline. Midodrine 10 mg BID. Has been signing off early due to cramping. To get standing weight before dialysis. 6. Anemia - down to 9 . Tsat 15% and Ferritin 585 on 1/28. IV fe ordered; resume Aranesp @ 100 7. Metabolic bone disease - PTH 180.9 on 1/28. Phoslo 2 TID WC. Hectorol  2. 8. Nutrition - Albumin 2.4. Nepro and liberalized low K+ diet as OP. Will continue here secondary to malnutrition.  9. HIV - Continue home meds  Myriam Jacobson, PA-C [place] [phone] 02/14/2012,8:04 AM  LOS: 3 days  I have seen and examined this patient and agree with the plan of care with changes above .  Soyla Bainter L 02/14/2012, 8:55 AM    Additional Objective Labs: Basic Metabolic Panel:  Recent Labs Lab 02/12/12 1530 02/13/12 0934 02/14/12 0614  NA 131* 134* 131*  K 5.0 3.9 4.4  CL 92* 94* 93*  CO2 23 25 26   GLUCOSE 127* 164* 92  BUN 62* 33* 43*  CREATININE 7.94* 5.67* 6.74*  CALCIUM 8.9 9.3 9.2  PHOS 6.7* 5.2* 6.8*   Liver Function Tests:  Recent Labs Lab 02/12/12 0025 02/12/12 1530 02/13/12 0934 02/14/12 0614  AST 16  --   --   --   ALT <5  --   --   --   ALKPHOS 107  --   --   --   BILITOT 0.4  --   --   --   PROT 9.3*  --   --   --   ALBUMIN 2.6* 2.4* 2.3* 2.2*   CBC:  Recent Labs Lab 02/12/12 0025 02/13/12 0934 02/14/12 0614  WBC 10.9* 8.4 7.5  NEUTROABS 8.0*  --   --  HGB 11.7* 10.6* 9.0*  HCT 35.9* 33.0* 29.0*  MCV 108.1* 110.0* 109.0*  PLT 257 222 210   Blood Culture    Component Value Date/Time   SDES WOUND RIGHT ARM 02/13/2012 1611   SPECREQUEST SWAB OF PERI GRAFT FLUID, PT ON ZYVOS 02/13/2012 1611   CULT NO GROWTH 1 DAY 02/13/2012 1611   REPTSTATUS PENDING 02/13/2012 1611  Medications:   . abacavir  600 mg Oral Daily  . B-complex with vitamin C  1 tablet Oral Daily  . calcium acetate  1,334 mg Oral TID WC  . Darunavir Ethanolate  800 mg Oral Q breakfast  . doxercalciferol  2 mcg Intravenous Q T,Th,Sa-HD  . feeding supplement (NEPRO CARB STEADY)  237 mL Oral BID BM  . ferric gluconate (FERRLECIT/NULECIT) IV  125 mg Intravenous Q T,Th,Sa-HD  . heparin  5,000 Units Subcutaneous Q8H  . lamiVUDine  50 mg Oral Daily  . lidocaine-prilocaine  1 application Topical 3 times weekly  . magic mouthwash  5 mL  Oral BID  . midodrine  10 mg Oral TID WC  . multivitamin  1 tablet Oral Daily  . ritonavir  100 mg Oral Q breakfast  . sodium chloride  3 mL Intravenous Q12H  . vancomycin  750 mg Intravenous Q T,Th,Sa-HD

## 2012-02-14 NOTE — Procedures (Signed)
I was present at this session.  I have reviewed the session itself and made appropriate changes.  HD with 16 ga needles in recent revised AVG  Melanie Openshaw L 2/8/20148:52 AM

## 2012-02-15 LAB — CBC
HCT: 30 % — ABNORMAL LOW (ref 36.0–46.0)
Hemoglobin: 9.7 g/dL — ABNORMAL LOW (ref 12.0–15.0)
MCH: 35.7 pg — ABNORMAL HIGH (ref 26.0–34.0)
MCHC: 32.3 g/dL (ref 30.0–36.0)
MCV: 110.3 fL — ABNORMAL HIGH (ref 78.0–100.0)
Platelets: 203 10*3/uL (ref 150–400)
RBC: 2.72 MIL/uL — ABNORMAL LOW (ref 3.87–5.11)
RDW: 14.9 % (ref 11.5–15.5)
WBC: 10.9 10*3/uL — ABNORMAL HIGH (ref 4.0–10.5)

## 2012-02-15 LAB — RENAL FUNCTION PANEL
Albumin: 2.2 g/dL — ABNORMAL LOW (ref 3.5–5.2)
BUN: 18 mg/dL (ref 6–23)
CO2: 23 mEq/L (ref 19–32)
Calcium: 9.3 mg/dL (ref 8.4–10.5)
Chloride: 98 mEq/L (ref 96–112)
Creatinine, Ser: 4.37 mg/dL — ABNORMAL HIGH (ref 0.50–1.10)
GFR calc Af Amer: 12 mL/min — ABNORMAL LOW (ref >90)
GFR calc non Af Amer: 10 mL/min — ABNORMAL LOW (ref >90)
Glucose, Bld: 78 mg/dL (ref 70–99)
Phosphorus: 4.5 mg/dL (ref 2.3–4.6)
Potassium: 4.5 mEq/L (ref 3.5–5.1)
Sodium: 135 mEq/L (ref 135–145)

## 2012-02-15 LAB — MAGNESIUM: Magnesium: 2.1 mg/dL (ref 1.5–2.5)

## 2012-02-15 LAB — WOUND CULTURE: Culture: NO GROWTH

## 2012-02-15 NOTE — Progress Notes (Signed)
Subjective: Interval History: has complaints wants to talk to Dr. Tommy Medal.  Objective: Vital signs in last 24 hours: Temp:  [97.7 F (36.5 C)-100.7 F (38.2 C)] 98.1 F (36.7 C) (02/09 0514) Pulse Rate:  [56-88] 70 (02/09 0514) Resp:  [17-20] 18 (02/09 0514) BP: (83-113)/(23-64) 113/42 mmHg (02/09 0514) SpO2:  [91 %-96 %] 91 % (02/09 0514) Weight:  [62.2 kg (137 lb 2 oz)-63.2 kg (139 lb 5.3 oz)] 62.2 kg (137 lb 2 oz) (02/08 2130) Weight change: 0.3 kg (10.6 oz)  Intake/Output from previous day: 02/08 0701 - 02/09 0700 In: 2299 [P.O.:840; I.V.:939; IV Piggyback:520] Out: 1700  Intake/Output this shift:    General appearance: alert and cooperative Resp: rales bibasilar Cardio: S1, S2 normal and systolic murmur: systolic ejection 2/6, decrescendo at 2nd left intercostal space GI: pos bs, liver down 4 cm Extremities: AVG RUA , edema around art end  Lab Results:  Recent Labs  02/14/12 0857 02/15/12 0519  WBC 8.3 10.9*  HGB 9.3* 9.7*  HCT 29.0* 30.0*  PLT 235 203   BMET:  Recent Labs  02/14/12 0857 02/15/12 0519  NA 132* 135  K 4.8 4.5  CL 93* 98  CO2 24 23  GLUCOSE 93 78  BUN 44* 18  CREATININE 6.88* 4.37*  CALCIUM 9.1 9.3   No results found for this basename: PTH,  in the last 72 hours Iron Studies: No results found for this basename: IRON, TIBC, TRANSFERRIN, FERRITIN,  in the last 72 hours  Studies/Results: Dg Chest 2 View  02/14/2012  *RADIOLOGY REPORT*  Clinical Data: Pneumonia.  Pulmonary edema.  History of CHF, HIV, hypertension.  CHEST - 2 VIEW  Comparison: 02/12/2012  Findings: Shallow inspiration.  Cardiac enlargement with normal pulmonary vascularity. Mild infiltration or atelectasis in the lung bases.  Improvement of left mid lung consolidation since previous study with minimal residual nodular changes present.  Suggestion of possible cystic or cavitary process developing in the left mid lung measuring 10 mm diameter.  IMPRESSION: Infiltration or  atelectasis in both lung bases.  Improving infiltration in the left mid lung with developing cystic or cavitary process likely postinflammatory.  Cardiac enlargement.   Original Report Authenticated By: Lucienne Capers, M.D.     I have reviewed the patient's current medications.  Assessment/Plan: 1 ESRD stable  2 Anemia on epo 3 Fevers/MRSA BC + on Vanco 4 HIV 5 Malnutirtion  P Suppl, hd, Vanco , HIV meds.    LOS: 4 days   [name] L 02/15/2012,10:30 AM

## 2012-02-15 NOTE — Progress Notes (Signed)
VASCULAR PROGRESS NOTE  SUBJECTIVE: No complaints  PHYSICAL EXAM: Filed Vitals:   02/14/12 1335 02/14/12 1646 02/14/12 2130 02/15/12 0514  BP: 99/64 105/63 98/62 113/42  Pulse: 88  68 70  Temp: 97.7 F (36.5 C) 99.6 F (37.6 C) 100.7 F (38.2 C) 98.1 F (36.7 C)  TempSrc:   Oral Oral  Resp: 17 17 18 18   Height:      Weight:   137 lb 2 oz (62.2 kg)   SpO2: 95%  94% 91%   Right upper arm AV graft has an audible bruit. Incisions healing well.  LABS: Lab Results  Component Value Date   WBC 10.9* 02/15/2012   HGB 9.7* 02/15/2012   HCT 30.0* 02/15/2012   MCV 110.3* 02/15/2012   PLT 203 02/15/2012   Lab Results  Component Value Date   CREATININE 6.88* 02/14/2012   Lab Results  Component Value Date   INR 1.60* 05/18/2011   CBG (last 3)  No results found for this basename: GLUCAP,  in the last 72 hours  Principal Problem:   MRSA bacteremia Active Problems:   HIV INFECTION   ESRD (end stage renal disease)   HCAP (healthcare-associated pneumonia)   ASSESSMENT AND PLAN: 1. 2 Days Post-Op s/p: revision of right upper arm AV graft. 2. Continue to cannulate above and below the 2 incisions. The area between the 2 incisions can be cannulated in 4 weeks. 3. Vascular surgery we will be available as needed.  Gae Gallop [id number] 02/15/2012

## 2012-02-15 NOTE — Progress Notes (Addendum)
Subjective: [name] was seen and examined this morning.  She had HD yesterday.  She claims to be feeling fine and is concerned about being in the hospital this long without having any of her belongings from her home.  Tmax 100.27F overnight.  She is POD#2 s/p revision of right upper arm AV graft.  She currently denies any chest pain, shortness of breath, chills, N/V/D, or abdominal pain at this time.  Objective: Vital signs in last 24 hours: Filed Vitals:   02/14/12 1335 02/14/12 1646 02/14/12 2130 02/15/12 0514  BP: 99/64 105/63 98/62 113/42  Pulse: 88  68 70  Temp: 97.7 F (36.5 C) 99.6 F (37.6 C) 100.7 F (38.2 C) 98.1 F (36.7 C)  TempSrc:   Oral Oral  Resp: 17 17 18 18   Height:      Weight:   137 lb 2 oz (62.2 kg)   SpO2: 95%  94% 91%   Weight change: 10.6 oz (0.3 kg)  Intake/Output Summary (Last 24 hours) at 02/15/12 1141 Last data filed at 02/15/12 0500  Gross per 24 hour  Intake   2299 ml  Output   1700 ml  Net    599 ml   Vitals reviewed. General: resting in bed, NAD HEENT: PERRLA, EOMI, no scleral icterus Cardiac: RRR, 3/6 SEM Pulm: improved breath sounds LLL, no wheezes, or rhonchi Abd: soft, nontender, nondistended, BS present Ext: warm and well perfused, no pedal edema, right upper arm bandage in place of RAVF.  +2dp b/l Neuro: alert and oriented X3, cranial nerves II-XII grossly intact, strength and sensation to light touch equal in bilateral upper and lower extremities  Lab Results: Basic Metabolic Panel:  Recent Labs Lab 02/14/12 0614 02/14/12 0857 02/15/12 0519  NA 131* 132* 135  K 4.4 4.8 4.5  CL 93* 93* 98  CO2 26 24 23   GLUCOSE 92 93 78  BUN 43* 44* 18  CREATININE 6.74* 6.88* 4.37*  CALCIUM 9.2 9.1 9.3  MG 2.0  --  2.1  PHOS 6.8* 6.7* 4.5   Liver Function Tests:  Recent Labs Lab 02/12/12 0025  02/14/12 0857 02/15/12 0519  AST 16  --   --   --   ALT <5  --   --   --   ALKPHOS 107  --   --   --   BILITOT 0.4  --   --   --   PROT  9.3*  --   --   --   ALBUMIN 2.6*  < > 2.2* 2.2*  < > = values in this interval not displayed. CBC:  Recent Labs Lab 02/12/12 0025  02/14/12 0857 02/15/12 0519  WBC 10.9*  < > 8.3 10.9*  NEUTROABS 8.0*  --   --   --   HGB 11.7*  < > 9.3* 9.7*  HCT 35.9*  < > 29.0* 30.0*  MCV 108.1*  < > 108.2* 110.3*  PLT 257  < > 235 203  < > = values in this interval not displayed. Micro Results: Recent Results (from the past 240 hour(s))  CULTURE, BLOOD (ROUTINE X 2)     Status: None   Collection Time    02/12/12 12:05 AM      Result Value Range Status   Specimen Description BLOOD LEFT HAND   Final   Special Requests BOTTLES DRAWN AEROBIC AND ANAEROBIC 5CC EA   Final   Culture  Setup Time 02/12/2012 04:22   Final   Culture  Final   Value: METHICILLIN RESISTANT STAPHYLOCOCCUS AUREUS     Note: RIFAMPIN AND GENTAMICIN SHOULD NOT BE USED AS SINGLE DRUGS FOR TREATMENT OF STAPH INFECTIONS. CRITICAL RESULT CALLED TO, READ BACK BY AND VERIFIED WITH: ASHLEY TONEY 02/13/12 1400 BY SMITHERSJ This organism DOES NOT demonstrate inducible Clindamycin      resistance in vitro.     STAPHYLOCOCCUS AUREUS     Note: Gram Stain Report Called to,Read Back By and Verified With: Kendall Flack RN ON 02/12/2012 AT 7:30P BY WILEJ   Report Status 02/14/2012 FINAL   Final   Organism ID, Bacteria STAPHYLOCOCCUS AUREUS   Final  CULTURE, BLOOD (ROUTINE X 2)     Status: None   Collection Time    02/12/12 12:10 AM      Result Value Range Status   Specimen Description BLOOD LEFT ARM   Final   Special Requests BOTTLES DRAWN AEROBIC AND ANAEROBIC 5CC EA   Final   Culture  Setup Time 02/12/2012 04:22   Final   Culture     Final   Value: METHICILLIN RESISTANT STAPHYLOCOCCUS AUREUS     Note: SUSCEPTIBILITIES PERFORMED ON PREVIOUS CULTURE WITHIN THE LAST 5 DAYS.     Note: Gram Stain Report Called to,Read Back By and Verified With: Kendall Flack RN ON 02/12/2012 AT 7:30P BY WILEJ   Report Status 02/14/2012 FINAL   Final  CULTURE,  BLOOD (ROUTINE X 2)     Status: None   Collection Time    02/13/12  1:30 PM      Result Value Range Status   Specimen Description BLOOD LEFT HAND   Final   Special Requests BOTTLES DRAWN AEROBIC ONLY [place]   Final   Culture  Setup Time 02/13/2012 18:01   Final   Culture     Final   Value:        BLOOD CULTURE RECEIVED NO GROWTH TO DATE CULTURE WILL BE HELD FOR 5 DAYS BEFORE ISSUING A FINAL NEGATIVE REPORT   Report Status PENDING   Incomplete  WOUND CULTURE     Status: None   Collection Time    02/13/12  4:11 PM      Result Value Range Status   Specimen Description WOUND RIGHT ARM   Final   Special Requests SWAB OF PERI GRAFT FLUID, PT ON ZYVOS   Final   Gram Stain     Final   Value: RARE WBC PRESENT, PREDOMINANTLY PMN     RARE SQUAMOUS EPITHELIAL CELLS PRESENT     NO ORGANISMS SEEN   Culture NO GROWTH 2 DAYS   Final   Report Status 02/15/2012 FINAL   Final  ANAEROBIC CULTURE     Status: None   Collection Time    02/13/12  4:11 PM      Result Value Range Status   Specimen Description WOUND RIGHT ARM   Final   Special Requests SWAB OF PERI GRAFT FLUID, PT ON ZYVOS   Final   Gram Stain PENDING   Incomplete   Culture     Final   Value: NO ANAEROBES ISOLATED; CULTURE IN PROGRESS FOR 5 DAYS   Report Status PENDING   Incomplete   Medications: I have reviewed the patient's current medications. Scheduled Meds: . abacavir  600 mg Oral Daily  . B-complex with vitamin C  1 tablet Oral Daily  . calcium acetate  1,334 mg Oral TID WC  . darbepoetin (ARANESP) injection - DIALYSIS  100 mcg Intravenous Q Sat-HD  .  Darunavir Ethanolate  800 mg Oral Q breakfast  . doxercalciferol  2 mcg Intravenous Q T,Th,Sa-HD  . feeding supplement  1 Container Oral TID BM  . ferric gluconate (FERRLECIT/NULECIT) IV  125 mg Intravenous Q T,Th,Sa-HD  . heparin  5,000 Units Subcutaneous Q8H  . lamiVUDine  50 mg Oral Daily  . lidocaine-prilocaine  1 application Topical 3 times weekly  . magic mouthwash  5 mL  Oral BID  . midodrine  10 mg Oral TID WC  . multivitamin  1 tablet Oral Daily  . ritonavir  100 mg Oral Q breakfast  . sodium chloride  3 mL Intravenous Q12H  . 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  Hx VRE.  Was initially started on Vancomycin and cefepime, changed to Zyvox and Ancef, and now back to Vancomycin.  Concern for possible endocarditis? TEE 08/2011: negative for endocarditis.   -f/u repeat blood cx x2 2/7--NGTD -Continue vancomycin for recommended 6 weeks total--day 4 -TEE Monday -check daily for "metastatic" site of infection  2) Sepsis--secondary to MRSA Bacteremia, ?HCAP-- LLL PNA (per xray), and ?infected graft in setting of ESRD and HIV disease.  POD#1 s/p revision of RUA AV graft--graft revised, no clear evidence of infection.  Hx of VRE.  T max 104F in ED.   Afebrile, leukocytosis resolved, and BP stable.  Lactate down to 1.0 2/8 and pro-calcitonin 2.85.   - PCCM following.   - monitor BP--hx of chronic hypotension, on Midodrine.  No need for pressors or stress steroid at this point in time. Monitoring closely - d/c zyvox and cefazolin, continue Vancomycin day 4--total 6 weeks - blood cultures x2 2/7--NGTD - av graft wound cultures pending--NGTD - CXR after HD: improving left mid lung infiltration with developing cystic or cavitary process likely postinflammatory - vascular following, OR 2/7 for graft revision, using graft for HD, can cannulate above and below 2 incisions for now.  In 4 weeks can cannulate between both incisions per  vascular.   3) Chronic hypotension at baseline.  On Midodrine 10mg  po TID.  No role for pressors at this time.   -BP Stable, continue to monitor  4) ESRD: HD at [facility] on T,Th,Sat. Was 7kg above EDW on admission.  Received last HD 2/6.  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   - HD Tuesday - Renal following for HD.   5) HIV: Recently saw Dr. Drucilla Schmidt on 02/04/2012 when CD4 count was 320.  - Continue home HIV medications.   6) Systolic Cardiomyopathy: Compensated. Last EF 35-40% in August 2013   7) Anemia--Hb 11.7 on admission, on EPO and Fe per renal.  Tsat 15% and Ferritin 585 on 1/28 per renal -Trend cbc -resume Aranesp per renal  Recent Labs Lab 02/14/12 0614 02/14/12 0857 02/15/12 0519  HGB 9.0* 9.3* 9.7*  HCT 29.0* 29.0* 30.0*  WBC 7.5 8.3 10.9*  PLT 210 235 203   8) Malnutrition--albumin 2.3.   -nutrition following--d/c nepro as she does not drink it, add resource breeze daily  9) Metabolic bone disease--PTH 180.9 on 1/28.   -continue Phoslo and Hectorol per renal  Diet: renal diet DVT prophylaxis: Heparin.  Dispo: Disposition is deferred at this time, awaiting improvement of current medical problems. Anticipated discharge in approximately 1-2 day(s).   The patient does have a current PCP (GARG, ANKIT, MD), therefore will be requiring OPC follow-up after discharge.  The patient does not know have transportation limitations that hinder transportation to clinic appointments.  Services Needed at time of discharge: Y = Yes, Blank = No PT:   OT:   RN:   Equipment:   Other:     LOS: 4 days   Jerene Pitch 02/15/2012, 11:41 AM

## 2012-02-16 ENCOUNTER — Encounter (HOSPITAL_COMMUNITY)
Admission: EM | Disposition: A | Discharge status: Skilled Nursing Facility | Payer: Self-pay | Source: Home / Self Care | Attending: Infectious Disease

## 2012-02-16 ENCOUNTER — Encounter (HOSPITAL_COMMUNITY): Payer: Self-pay | Admitting: Nephrology

## 2012-02-16 DIAGNOSIS — I339 Acute and subacute endocarditis, unspecified: Secondary | ICD-10-CM

## 2012-02-16 HISTORY — PX: TEE WITHOUT CARDIOVERSION: SHX5443

## 2012-02-16 LAB — COMPREHENSIVE METABOLIC PANEL
ALT: <5 U/L (ref 0–35)
AST: 15 U/L (ref 0–37)
Albumin: 2.3 g/dL — ABNORMAL LOW (ref 3.5–5.2)
Alkaline Phosphatase: 97 U/L (ref 39–117)
BUN: 24 mg/dL — ABNORMAL HIGH (ref 6–23)
CO2: 28 mEq/L (ref 19–32)
Calcium: 10.1 mg/dL (ref 8.4–10.5)
Chloride: 95 mEq/L — ABNORMAL LOW (ref 96–112)
Creatinine, Ser: 5.96 mg/dL — ABNORMAL HIGH (ref 0.50–1.10)
GFR calc Af Amer: 8 mL/min — ABNORMAL LOW (ref >90)
GFR calc non Af Amer: 7 mL/min — ABNORMAL LOW (ref >90)
Glucose, Bld: 77 mg/dL (ref 70–99)
Potassium: 4.1 mEq/L (ref 3.5–5.1)
Sodium: 134 mEq/L — ABNORMAL LOW (ref 135–145)
Total Bilirubin: 0.3 mg/dL (ref 0.3–1.2)
Total Protein: 8.3 g/dL (ref 6.0–8.3)

## 2012-02-16 LAB — CBC
HCT: 30.7 % — ABNORMAL LOW (ref 36.0–46.0)
Hemoglobin: 9.4 g/dL — ABNORMAL LOW (ref 12.0–15.0)
MCH: 33.5 pg (ref 26.0–34.0)
MCHC: 30.6 g/dL (ref 30.0–36.0)
MCV: 109.3 fL — ABNORMAL HIGH (ref 78.0–100.0)
Platelets: 227 10*3/uL (ref 150–400)
RBC: 2.81 MIL/uL — ABNORMAL LOW (ref 3.87–5.11)
RDW: 15 % (ref 11.5–15.5)
WBC: 7.3 10*3/uL (ref 4.0–10.5)

## 2012-02-16 LAB — PHOSPHORUS: Phosphorus: 5.1 mg/dL — ABNORMAL HIGH (ref 2.3–4.6)

## 2012-02-16 LAB — MAGNESIUM: Magnesium: 2.1 mg/dL (ref 1.5–2.5)

## 2012-02-16 SURGERY — ECHOCARDIOGRAM, TRANSESOPHAGEAL
Anesthesia: Moderate Sedation

## 2012-02-16 MED ORDER — NEPRO/CARBSTEADY PO LIQD: 237.0000 mL | ORAL | Status: DC | PRN

## 2012-02-16 MED ORDER — HEPARIN SODIUM (PORCINE) 1000 UNIT/ML DIALYSIS
1000.0000 [IU] | INTRAMUSCULAR | Status: DC | PRN
  Filled 2012-02-16: qty 1

## 2012-02-16 MED ORDER — PENTAFLUOROPROP-TETRAFLUOROETH EX AERO: 1.0000 "application " | INHALATION_SPRAY | CUTANEOUS | Status: DC | PRN

## 2012-02-16 MED ORDER — ALTEPLASE 2 MG IJ SOLR: 2.0000 mg | Freq: Once | INTRAMUSCULAR | Status: AC | PRN

## 2012-02-16 MED ORDER — FENTANYL CITRATE 0.05 MG/ML IJ SOLN
INTRAMUSCULAR | Status: DC | PRN
  Administered 2012-02-16: 25 ug via INTRAVENOUS

## 2012-02-16 MED ORDER — LIDOCAINE VISCOUS 2 % MT SOLN
OROMUCOSAL | Status: DC | PRN
  Administered 2012-02-16: 20 mL via OROMUCOSAL

## 2012-02-16 MED ORDER — SODIUM CHLORIDE 0.9 % IV SOLN
INTRAVENOUS | Status: DC
  Administered 2012-02-16: 250 mL via INTRAVENOUS

## 2012-02-16 MED ORDER — LIDOCAINE HCL (PF) 1 % IJ SOLN: 5.0000 mL | INTRAMUSCULAR | Status: DC | PRN

## 2012-02-16 MED ORDER — LIDOCAINE-PRILOCAINE 2.5-2.5 % EX CREA: 1.0000 "application " | TOPICAL_CREAM | CUTANEOUS | Status: DC | PRN

## 2012-02-16 MED ORDER — HEPARIN SODIUM (PORCINE) 1000 UNIT/ML DIALYSIS
20.0000 [IU]/kg | INTRAMUSCULAR | Status: DC | PRN
  Administered 2012-02-17: 1300 [IU] via INTRAVENOUS_CENTRAL
  Filled 2012-02-16: qty 2

## 2012-02-16 MED ORDER — MIDAZOLAM HCL 10 MG/2ML IJ SOLN
INTRAMUSCULAR | Status: DC | PRN
  Administered 2012-02-16 (×2): 2 mg via INTRAVENOUS

## 2012-02-16 MED ORDER — SODIUM CHLORIDE 0.9 % IV SOLN: 100.0000 mL | INTRAVENOUS | Status: DC | PRN

## 2012-02-16 MED ORDER — MIDAZOLAM HCL 5 MG/ML IJ SOLN
INTRAMUSCULAR | Status: AC
  Filled 2012-02-16: qty 2

## 2012-02-16 MED ORDER — FENTANYL CITRATE 0.05 MG/ML IJ SOLN
INTRAMUSCULAR | Status: AC
  Filled 2012-02-16: qty 2

## 2012-02-16 MED ORDER — LIDOCAINE VISCOUS 2 % MT SOLN
OROMUCOSAL | Status: AC
  Filled 2012-02-16: qty 15

## 2012-02-16 NOTE — Progress Notes (Signed)
Subjective: [name] was seen and examined this morning.  She is scheduled for TEE today and HD tomorrow.  She claims to be feeling fine and is concerned about being in the hospital this long without having any of her belongings from her home.  Afebrile over night.  She is POD#3 s/p revision of right upper arm AV graft.  She currently denies any chest pain, shortness of breath, chills, N/V/D, or abdominal pain at this time.  Objective: Vital signs in last 24 hours: Filed Vitals:   02/15/12 1619 02/15/12 2046 02/16/12 0456 02/16/12 0806  BP: 128/66 133/87 132/54 120/57  Pulse: 60 53 56   Temp: 97.5 F (36.4 C) 97.4 F (36.3 C) 97.4 F (36.3 C)   TempSrc:  Oral Oral   Resp: 17 18 16    Height:      Weight:  138 lb 14.2 oz (63 kg)    SpO2: 94% 100% 100%    Weight change: -5 lb 11.7 oz (-2.6 kg)  Intake/Output Summary (Last 24 hours) at 02/16/12 0929 Last data filed at 02/16/12 0700  Gross per 24 hour  Intake    120 ml  Output      0 ml  Net    120 ml   Vitals reviewed. General: resting in bed, NAD HEENT: PERRLA, EOMI, no scleral icterus Cardiac: RRR, 3/6 SEM Pulm: improved breath sounds LLL, no wheezes, or rhonchi Abd: soft, nontender, nondistended, BS present Ext: warm and well perfused, no pedal edema, right upper arm bandage in place of RAVF.  +2dp b/l Neuro: alert and oriented X3, cranial nerves II-XII grossly intact, strength and sensation to light touch equal in bilateral upper and lower extremities  Lab Results: Basic Metabolic Panel:  Recent Labs Lab 02/15/12 0519 02/16/12 0540  NA 135 134*  K 4.5 4.1  CL 98 95*  CO2 23 28  GLUCOSE 78 77  BUN 18 24*  CREATININE 4.37* 5.96*  CALCIUM 9.3 10.1  MG 2.1 2.1  PHOS 4.5 5.1*   Liver Function Tests:  Recent Labs Lab 02/12/12 0025  02/15/12 0519 02/16/12 0540  AST 16  --   --  15  ALT <5  --   --  <5  ALKPHOS 107  --   --  97  BILITOT 0.4  --   --  0.3  PROT 9.3*  --   --  8.3  ALBUMIN 2.6*  < > 2.2* 2.3*   < > = values in this interval not displayed. CBC:  Recent Labs Lab 02/12/12 0025  02/15/12 0519 02/16/12 0540  WBC 10.9*  < > 10.9* 7.3  NEUTROABS 8.0*  --   --   --   HGB 11.7*  < > 9.7* 9.4*  HCT 35.9*  < > 30.0* 30.7*  MCV 108.1*  < > 110.3* 109.3*  PLT 257  < > 203 227  < > = values in this interval not displayed. Micro Results: Recent Results (from the past 240 hour(s))  CULTURE, BLOOD (ROUTINE X 2)     Status: None   Collection Time    02/12/12 12:05 AM      Result Value Range Status   Specimen Description BLOOD LEFT HAND   Final   Special Requests BOTTLES DRAWN AEROBIC AND ANAEROBIC 5CC EA   Final   Culture  Setup Time 02/12/2012 04:22   Final   Culture     Final   Value: METHICILLIN RESISTANT STAPHYLOCOCCUS AUREUS     Note: RIFAMPIN AND  GENTAMICIN SHOULD NOT BE USED AS SINGLE DRUGS FOR TREATMENT OF STAPH INFECTIONS. CRITICAL RESULT CALLED TO, READ BACK BY AND VERIFIED WITH: ASHLEY TONEY 02/13/12 1400 BY SMITHERSJ This organism DOES NOT demonstrate inducible Clindamycin      resistance in vitro.     STAPHYLOCOCCUS AUREUS     Note: Gram Stain Report Called to,Read Back By and Verified With: Kendall Flack RN ON 02/12/2012 AT 7:30P BY WILEJ   Report Status 02/14/2012 FINAL   Final   Organism ID, Bacteria STAPHYLOCOCCUS AUREUS   Final  CULTURE, BLOOD (ROUTINE X 2)     Status: None   Collection Time    02/12/12 12:10 AM      Result Value Range Status   Specimen Description BLOOD LEFT ARM   Final   Special Requests BOTTLES DRAWN AEROBIC AND ANAEROBIC 5CC EA   Final   Culture  Setup Time 02/12/2012 04:22   Final   Culture     Final   Value: METHICILLIN RESISTANT STAPHYLOCOCCUS AUREUS     Note: SUSCEPTIBILITIES PERFORMED ON PREVIOUS CULTURE WITHIN THE LAST 5 DAYS.     Note: Gram Stain Report Called to,Read Back By and Verified With: Kendall Flack RN ON 02/12/2012 AT 7:30P BY WILEJ   Report Status 02/14/2012 FINAL   Final  CULTURE, BLOOD (ROUTINE X 2)     Status: None   Collection  Time    02/13/12  1:30 PM      Result Value Range Status   Specimen Description BLOOD LEFT HAND   Final   Special Requests BOTTLES DRAWN AEROBIC ONLY [place]   Final   Culture  Setup Time 02/13/2012 18:01   Final   Culture     Final   Value:        BLOOD CULTURE RECEIVED NO GROWTH TO DATE CULTURE WILL BE HELD FOR 5 DAYS BEFORE ISSUING A FINAL NEGATIVE REPORT   Report Status PENDING   Incomplete  WOUND CULTURE     Status: None   Collection Time    02/13/12  4:11 PM      Result Value Range Status   Specimen Description WOUND RIGHT ARM   Final   Special Requests SWAB OF PERI GRAFT FLUID, PT ON ZYVOS   Final   Gram Stain     Final   Value: RARE WBC PRESENT, PREDOMINANTLY PMN     RARE SQUAMOUS EPITHELIAL CELLS PRESENT     NO ORGANISMS SEEN   Culture NO GROWTH 2 DAYS   Final   Report Status 02/15/2012 FINAL   Final  ANAEROBIC CULTURE     Status: None   Collection Time    02/13/12  4:11 PM      Result Value Range Status   Specimen Description WOUND RIGHT ARM   Final   Special Requests SWAB OF PERI GRAFT FLUID, PT ON ZYVOS   Final   Gram Stain PENDING   Incomplete   Culture     Final   Value: NO ANAEROBES ISOLATED; CULTURE IN PROGRESS FOR 5 DAYS   Report Status PENDING   Incomplete   Medications: I have reviewed the patient's current medications. Scheduled Meds: . abacavir  600 mg Oral Daily  . B-complex with vitamin C  1 tablet Oral Daily  . calcium acetate  1,334 mg Oral TID WC  . darbepoetin (ARANESP) injection - DIALYSIS  100 mcg Intravenous Q Sat-HD  . Darunavir Ethanolate  800 mg Oral Q breakfast  . doxercalciferol  2 mcg Intravenous  Q T,Th,Sa-HD  . feeding supplement  1 Container Oral TID BM  . ferric gluconate (FERRLECIT/NULECIT) IV  125 mg Intravenous Q T,Th,Sa-HD  . heparin  5,000 Units Subcutaneous Q8H  . lamiVUDine  50 mg Oral Daily  . lidocaine-prilocaine  1 application Topical 3 times weekly  . magic mouthwash  5 mL Oral BID  . midodrine  10 mg Oral TID WC  .  multivitamin  1 tablet Oral Daily  . ritonavir  100 mg Oral Q breakfast  . sodium chloride  3 mL Intravenous Q12H  . 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  Hx VRE.  Was initially started on Vancomycin and cefepime, changed to Zyvox and Ancef, and now back to Vancomycin.  Concern for possible endocarditis? TEE 08/2011: negative for endocarditis.   -f/u repeat blood cx x2 2/7--NGTD -Continue vancomycin for recommended 6 weeks total--day 5, may add rifampin but will need to change anti-retrovirals -TEE today -check daily for "metastatic" site of infection   2) Sepsis--secondary to MRSA Bacteremia, ?HCAP-- LLL PNA (per xray), and ?infected graft in setting of ESRD and HIV disease.  POD#1 s/p revision of RUA AV graft--graft revised, no clear evidence of infection.  Hx of VRE.  T max 104F in ED.   Afebrile, leukocytosis resolved, and BP stable.  Lactate down to 1.0 2/8 and pro-calcitonin 2.85.   - PCCM following.   - monitor BP--hx of chronic hypotension, on Midodrine.  No need for pressors or stress steroid at this point in time. Monitoring closely - d/c zyvox and cefazolin, continue Vancomycin day 4--total 6 weeks - blood cultures x2 2/7--NGTD - av graft wound cultures pending--NGTD - CXR after HD: improving left mid lung infiltration with developing cystic or cavitary process likely postinflammatory - vascular following, OR 2/7 for graft revision, using graft for HD, can cannulate above and below 2 incisions for now.  In 4 weeks can cannulate between both  incisions per vascular.   3) Chronic hypotension at baseline.  On Midodrine 10mg  po TID.  No role for pressors at this time.   -BP Stable, continue to monitor  4) ESRD: HD at [facility] on T,Th,Sat. Was 7kg above EDW on admission.  Received last HD 2/6.  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   - HD Tuesday - Renal following for HD.   5) HIV: Recently saw Dr. Drucilla Schmidt on 02/04/2012 when CD4 count was 320.  - Continue home HIV medications--may need to make adjustments if Rifampin added to antibiotic regimen  6) Systolic Cardiomyopathy: Compensated. Last EF 35-40% in August 2013   7) Anemia--Hb 11.7 on admission, on EPO and Fe per renal.  Tsat 15% and Ferritin 585 on 1/28 per renal -Trend cbc -resume Aranesp per renal  Recent Labs Lab 02/14/12 0857 02/15/12 0519 02/16/12 0540  HGB 9.3* 9.7* 9.4*  HCT 29.0* 30.0* 30.7*  WBC 8.3 10.9* 7.3  PLT 235 203 227   8) Malnutrition--albumin 2.3.   -nutrition following--d/c nepro as she does not drink it, add resource breeze daily  9) Metabolic bone disease--PTH 180.9 on 1/28.   -continue Phoslo and Hectorol per renal  Diet: NPO  for TEE DVT prophylaxis: Heparin.  Dispo: Disposition is deferred at this time, awaiting improvement of current medical problems. Anticipated discharge in approximately 1-2 day(s).   The patient does have a current PCP (GARG, ANKIT, MD), therefore will be requiring OPC follow-up after discharge.  The patient does not know have transportation limitations that hinder transportation to clinic appointments.  Services Needed at time of discharge: Y = Yes, Blank = No PT:   OT:   RN:   Equipment:   Other:     LOS: 5 days   Jerene Pitch 02/16/2012, 9:29 AM

## 2012-02-16 NOTE — Progress Notes (Signed)
  Echocardiogram Echocardiogram Transesophageal has been performed.  Ardelle Balls A 02/16/2012, 4:12 PM

## 2012-02-16 NOTE — Progress Notes (Signed)
    [facility]  Staphylococcus aureus bacteremia (SAB) is associated with a high rate of complications and mortality.  Specific aspects of clinical management are critical to optimizing the outcome of patients with SAB.  Therefore, the [facility]imicrobial Management Team ([facility]) have initiated an intervention aimed at improving the management of SAB at [facility].  To do so, Infectious Diseases Consultants are providing evidence-based recommendations for the management of all patients with SAB.  The specific recommendations for this patient are marked "X" in this document.  Recommended [ x ]  Completed [2/7//14]  Perform follow-up blood cultures (even if the patient is afebrile) to ensure clearance of bacteremia.  Recommended [  ]  Completed [date]   Remove vascular catheter, and obtain follow-up cultures after removal of catheter  NA  Recommended [x  ]  Completed [02/16/12]   Perform echocardiography to evaluate for endocarditis (transthoracic ECHO is 40-50% sensitive, TEE is > 90% sensitive).*  Please keep in mind, that neither test can definitively EXCLUDE endocarditis, and that should clinical suspicion remain high for endocarditis the patient should then still be treated with an "endocarditis" duration of therapy = 6 weeks  TEE report pending  Recommended [  ]  Completed [date]   Consult electrophysiologist to evaluate implanted cardiac device (pacemaker, ICD)  NA   Recommended [x  ]  Completed [2/7//14]   Ensure source control.  Have all abscesses been drained effectively? Have deep seeded infections (septic joints or osteomyelitis) had appropriate surgical debridement?  Pt sp [facility] surgery on 02/13/12 without overt evidence of infection in graft  Recommended [x  ]  Completed [daily]   Investigate for "metastatic" sites of infection.   Does the patient have ANY symptom or physical exam finding that would suggest a deeper infection (back or neck pain  that may be suggestive of vertebral osteomyelitis or epidural abscess, muscle pain that could be a symptom of pyomyositis)?  Keep in mind that for deep seeded infections MRI imaging with contrast is preferred rather than other often insensitive tests such as plain x-rays, especially early in a patient's presentation.   No clinical evidence for metastatic infection  Recommended [x  ] Vancomycin vancomycin, goal trough should be 15 - 20 mg/L)  [ x ]  Estimated duration of IV  Antibiotic therapy:  6 weeks (dependent on diagnostic test results).    [x  ]  Consult case management for probable prolonged outpatient intravenous antibiotic therapy.  Patient should be able to get Vancomycin with HD.   I will not add rifampin due to mx drug drug interactions with ARV and without absolute proof of graft infection  Will need to watch for risk of relaps of bacteremia when off IV vancomycin

## 2012-02-16 NOTE — H&P (View-Only) (Signed)
Subjective: [name] was seen and examined this morning.  She is scheduled for TEE today and HD tomorrow.  She claims to be feeling fine and is concerned about being in the hospital this long without having any of her belongings from her home.  Afebrile over night.  She is POD#3 s/p revision of right upper arm AV graft.  She currently denies any chest pain, shortness of breath, chills, N/V/D, or abdominal pain at this time.  Objective: Vital signs in last 24 hours: Filed Vitals:   02/15/12 1619 02/15/12 2046 02/16/12 0456 02/16/12 0806  BP: 128/66 133/87 132/54 120/57  Pulse: 60 53 56   Temp: 97.5 F (36.4 C) 97.4 F (36.3 C) 97.4 F (36.3 C)   TempSrc:  Oral Oral   Resp: 17 18 16    Height:      Weight:  138 lb 14.2 oz (63 kg)    SpO2: 94% 100% 100%    Weight change: -5 lb 11.7 oz (-2.6 kg)  Intake/Output Summary (Last 24 hours) at 02/16/12 0929 Last data filed at 02/16/12 0700  Gross per 24 hour  Intake    120 ml  Output      0 ml  Net    120 ml   Vitals reviewed. General: resting in bed, NAD HEENT: PERRLA, EOMI, no scleral icterus Cardiac: RRR, 3/6 SEM Pulm: improved breath sounds LLL, no wheezes, or rhonchi Abd: soft, nontender, nondistended, BS present Ext: warm and well perfused, no pedal edema, right upper arm bandage in place of RAVF.  +2dp b/l Neuro: alert and oriented X3, cranial nerves II-XII grossly intact, strength and sensation to light touch equal in bilateral upper and lower extremities  Lab Results: Basic Metabolic Panel:  Recent Labs Lab 02/15/12 0519 02/16/12 0540  NA 135 134*  K 4.5 4.1  CL 98 95*  CO2 23 28  GLUCOSE 78 77  BUN 18 24*  CREATININE 4.37* 5.96*  CALCIUM 9.3 10.1  MG 2.1 2.1  PHOS 4.5 5.1*   Liver Function Tests:  Recent Labs Lab 02/12/12 0025  02/15/12 0519 02/16/12 0540  AST 16  --   --  15  ALT <5  --   --  <5  ALKPHOS 107  --   --  97  BILITOT 0.4  --   --  0.3  PROT 9.3*  --   --  8.3  ALBUMIN 2.6*  < > 2.2* 2.3*   < > = values in this interval not displayed. CBC:  Recent Labs Lab 02/12/12 0025  02/15/12 0519 02/16/12 0540  WBC 10.9*  < > 10.9* 7.3  NEUTROABS 8.0*  --   --   --   HGB 11.7*  < > 9.7* 9.4*  HCT 35.9*  < > 30.0* 30.7*  MCV 108.1*  < > 110.3* 109.3*  PLT 257  < > 203 227  < > = values in this interval not displayed. Micro Results: Recent Results (from the past 240 hour(s))  CULTURE, BLOOD (ROUTINE X 2)     Status: None   Collection Time    02/12/12 12:05 AM      Result Value Range Status   Specimen Description BLOOD LEFT HAND   Final   Special Requests BOTTLES DRAWN AEROBIC AND ANAEROBIC 5CC EA   Final   Culture  Setup Time 02/12/2012 04:22   Final   Culture     Final   Value: METHICILLIN RESISTANT STAPHYLOCOCCUS AUREUS     Note: RIFAMPIN AND  GENTAMICIN SHOULD NOT BE USED AS SINGLE DRUGS FOR TREATMENT OF STAPH INFECTIONS. CRITICAL RESULT CALLED TO, READ BACK BY AND VERIFIED WITH: ASHLEY TONEY 02/13/12 1400 BY SMITHERSJ This organism DOES NOT demonstrate inducible Clindamycin      resistance in vitro.     STAPHYLOCOCCUS AUREUS     Note: Gram Stain Report Called to,Read Back By and Verified With: Kendall Flack RN ON 02/12/2012 AT 7:30P BY WILEJ   Report Status 02/14/2012 FINAL   Final   Organism ID, Bacteria STAPHYLOCOCCUS AUREUS   Final  CULTURE, BLOOD (ROUTINE X 2)     Status: None   Collection Time    02/12/12 12:10 AM      Result Value Range Status   Specimen Description BLOOD LEFT ARM   Final   Special Requests BOTTLES DRAWN AEROBIC AND ANAEROBIC 5CC EA   Final   Culture  Setup Time 02/12/2012 04:22   Final   Culture     Final   Value: METHICILLIN RESISTANT STAPHYLOCOCCUS AUREUS     Note: SUSCEPTIBILITIES PERFORMED ON PREVIOUS CULTURE WITHIN THE LAST 5 DAYS.     Note: Gram Stain Report Called to,Read Back By and Verified With: Kendall Flack RN ON 02/12/2012 AT 7:30P BY WILEJ   Report Status 02/14/2012 FINAL   Final  CULTURE, BLOOD (ROUTINE X 2)     Status: None   Collection  Time    02/13/12  1:30 PM      Result Value Range Status   Specimen Description BLOOD LEFT HAND   Final   Special Requests BOTTLES DRAWN AEROBIC ONLY [place]   Final   Culture  Setup Time 02/13/2012 18:01   Final   Culture     Final   Value:        BLOOD CULTURE RECEIVED NO GROWTH TO DATE CULTURE WILL BE HELD FOR 5 DAYS BEFORE ISSUING A FINAL NEGATIVE REPORT   Report Status PENDING   Incomplete  WOUND CULTURE     Status: None   Collection Time    02/13/12  4:11 PM      Result Value Range Status   Specimen Description WOUND RIGHT ARM   Final   Special Requests SWAB OF PERI GRAFT FLUID, PT ON ZYVOS   Final   Gram Stain     Final   Value: RARE WBC PRESENT, PREDOMINANTLY PMN     RARE SQUAMOUS EPITHELIAL CELLS PRESENT     NO ORGANISMS SEEN   Culture NO GROWTH 2 DAYS   Final   Report Status 02/15/2012 FINAL   Final  ANAEROBIC CULTURE     Status: None   Collection Time    02/13/12  4:11 PM      Result Value Range Status   Specimen Description WOUND RIGHT ARM   Final   Special Requests SWAB OF PERI GRAFT FLUID, PT ON ZYVOS   Final   Gram Stain PENDING   Incomplete   Culture     Final   Value: NO ANAEROBES ISOLATED; CULTURE IN PROGRESS FOR 5 DAYS   Report Status PENDING   Incomplete   Medications: I have reviewed the patient's current medications. Scheduled Meds: . abacavir  600 mg Oral Daily  . B-complex with vitamin C  1 tablet Oral Daily  . calcium acetate  1,334 mg Oral TID WC  . darbepoetin (ARANESP) injection - DIALYSIS  100 mcg Intravenous Q Sat-HD  . Darunavir Ethanolate  800 mg Oral Q breakfast  . doxercalciferol  2 mcg Intravenous  Q T,Th,Sa-HD  . feeding supplement  1 Container Oral TID BM  . ferric gluconate (FERRLECIT/NULECIT) IV  125 mg Intravenous Q T,Th,Sa-HD  . heparin  5,000 Units Subcutaneous Q8H  . lamiVUDine  50 mg Oral Daily  . lidocaine-prilocaine  1 application Topical 3 times weekly  . magic mouthwash  5 mL Oral BID  . midodrine  10 mg Oral TID WC  .  multivitamin  1 tablet Oral Daily  . ritonavir  100 mg Oral Q breakfast  . sodium chloride  3 mL Intravenous Q12H  . 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  Hx VRE.  Was initially started on Vancomycin and cefepime, changed to Zyvox and Ancef, and now back to Vancomycin.  Concern for possible endocarditis? TEE 08/2011: negative for endocarditis.   -f/u repeat blood cx x2 2/7--NGTD -Continue vancomycin for recommended 6 weeks total--day 5, may add rifampin but will need to change anti-retrovirals -TEE today -check daily for "metastatic" site of infection   2) Sepsis--secondary to MRSA Bacteremia, ?HCAP-- LLL PNA (per xray), and ?infected graft in setting of ESRD and HIV disease.  POD#1 s/p revision of RUA AV graft--graft revised, no clear evidence of infection.  Hx of VRE.  T max 104F in ED.   Afebrile, leukocytosis resolved, and BP stable.  Lactate down to 1.0 2/8 and pro-calcitonin 2.85.   - PCCM following.   - monitor BP--hx of chronic hypotension, on Midodrine.  No need for pressors or stress steroid at this point in time. Monitoring closely - d/c zyvox and cefazolin, continue Vancomycin day 4--total 6 weeks - blood cultures x2 2/7--NGTD - av graft wound cultures pending--NGTD - CXR after HD: improving left mid lung infiltration with developing cystic or cavitary process likely postinflammatory - vascular following, OR 2/7 for graft revision, using graft for HD, can cannulate above and below 2 incisions for now.  In 4 weeks can cannulate between both  incisions per vascular.   3) Chronic hypotension at baseline.  On Midodrine 10mg  po TID.  No role for pressors at this time.   -BP Stable, continue to monitor  4) ESRD: HD at [facility] on T,Th,Sat. Was 7kg above EDW on admission.  Received last HD 2/6.  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   - HD Tuesday - Renal following for HD.   5) HIV: Recently saw Dr. Drucilla Schmidt on 02/04/2012 when CD4 count was 320.  - Continue home HIV medications--may need to make adjustments if Rifampin added to antibiotic regimen  6) Systolic Cardiomyopathy: Compensated. Last EF 35-40% in August 2013   7) Anemia--Hb 11.7 on admission, on EPO and Fe per renal.  Tsat 15% and Ferritin 585 on 1/28 per renal -Trend cbc -resume Aranesp per renal  Recent Labs Lab 02/14/12 0857 02/15/12 0519 02/16/12 0540  HGB 9.3* 9.7* 9.4*  HCT 29.0* 30.0* 30.7*  WBC 8.3 10.9* 7.3  PLT 235 203 227   8) Malnutrition--albumin 2.3.   -nutrition following--d/c nepro as she does not drink it, add resource breeze daily  9) Metabolic bone disease--PTH 180.9 on 1/28.   -continue Phoslo and Hectorol per renal  Diet: NPO  for TEE DVT prophylaxis: Heparin.  Dispo: Disposition is deferred at this time, awaiting improvement of current medical problems. Anticipated discharge in approximately 1-2 day(s).   The patient does have a current PCP (GARG, ANKIT, MD), therefore will be requiring OPC follow-up after discharge.  The patient does not know have transportation limitations that hinder transportation to clinic appointments.  Services Needed at time of discharge: Y = Yes, Blank = No PT:   OT:   RN:   Equipment:   Other:     LOS: 5 days   Jerene Pitch 02/16/2012, 9:29 AM

## 2012-02-16 NOTE — Interval H&P Note (Signed)
History and Physical Interval Note:  02/16/2012 1:33 PM  [name]  has presented today for surgery, with the diagnosis of MRSA, R/O Endocarditis  The various methods of treatment have been discussed with the patient and family. After consideration of risks, benefits and other options for treatment, the patient has consented to  Procedure(s) with comments: TRANSESOPHAGEAL ECHOCARDIOGRAM (TEE) (N/A) - Rm 6710 as a surgical intervention .  The patient's history has been reviewed, patient examined, no change in status, stable for surgery.  I have reviewed the patient's chart and labs.  Questions were answered to the patient's satisfaction.     Dorris Carnes

## 2012-02-16 NOTE — Progress Notes (Signed)
Utilization review completed.  

## 2012-02-16 NOTE — Op Note (Signed)
Report to follow in CV section

## 2012-02-16 NOTE — Progress Notes (Signed)
Subjective:  No current complaints, feeling well, wants to go home.  Objective: Vital signs in last 24 hours: Temp:  [97.4 F (36.3 C)-97.5 F (36.4 C)] 97.4 F (36.3 C) (02/10 0941) Pulse Rate:  [53-60] 54 (02/10 0941) Resp:  [16-20] 20 (02/10 0941) BP: (114-133)/(51-87) 114/51 mmHg (02/10 0941) SpO2:  [94 %-100 %] 100 % (02/10 0941) Weight:  [63 kg (138 lb 14.2 oz)] 63 kg (138 lb 14.2 oz) (02/09 2046) Weight change: -2.6 kg (-5 lb 11.7 oz)  Intake/Output from previous day: 02/09 0701 - 02/10 0700 In: 120 [P.O.:120] Out: -    EXAM: General appearance: Alert, in no apparent distress Resp:  CTA without rales, rhonchi, or wheezes Cardio:  RRR with Gr II/VI systolic murmur, no rub GI:  + BS, soft and nontender Extremities:  No edema Access:  AVG @ RUA with + bruit  Lab Results:  Recent Labs  02/15/12 0519 02/16/12 0540  WBC 10.9* 7.3  HGB 9.7* 9.4*  HCT 30.0* 30.7*  PLT 203 227   BMET:  Recent Labs  02/15/12 0519 02/16/12 0540  NA 135 134*  K 4.5 4.1  CL 98 95*  CO2 23 28  GLUCOSE 78 77  BUN 18 24*  CREATININE 4.37* 5.96*  CALCIUM 9.3 10.1  ALBUMIN 2.2* 2.3*   No results found for this basename: PTH,  in the last 72 hours Iron Studies: No results found for this basename: IRON, TIBC, TRANSFERRIN, FERRITIN,  in the last 72 hours  Dialysis Orders: [facility] . EDW 64.5 HD Bath 2K/2.25Ca Time 4:00 Heparin 5900. Access RUA AVF BFR 400 DFR A1.5 Hectorol 2 mcg IV/HD Epogen 18,600 Units IV/HD Venofer 50 mg IV weekly Several recent EDW increases. Ongoing complaints of cramping.  Assessment/Plan: 1. Fever - Blood cultures + for MRSA, started Vancomycin 2/7, s/p revision of RUA AVG by Dr. Scot Dock on 2/7, no evidence of infection; WBCs down to 7.3, now afebrile. 2. ESRD - HD on TTS @ [place]; K 4.1.  Next HD tomorrow. 3. HTN/Volume - BP 114/51 on Midodrine 10 mg tid; current wt 63 kg with EDW 64.5. 4. Anemia - Hgb 9.4, s/p Aranesp 100 mcg on Sat and IV Fe  qHD.[name] 5. Secondary hyperparathyroidism - Ca 10.1(11.5 corrected), P 5.1, on Hectorol 4 mcg, Phoslo 2 with meals. Use 2Ca bath, dc Hectorol. 6. Nutrition - Alb 2.3, on liberalized diet, Nepro. 7. HIV - on antivirals per ID.    LOS: 5 days   [name],[name] 02/16/2012,10:14 AM  Patient seen and examined.  Agree with assessment and plan as above. Kelly Splinter  MD [phone] pgr    [phone] cell 02/16/2012, 11:11 AM

## 2012-02-16 NOTE — Progress Notes (Signed)
Internal Medicine Teaching Service Attending Note Date: 02/16/2012  Patient name: [name]  Medical record number: [id number]  Date of birth: 01-14-1955    This patient has been seen and discussed with the house staff. Please see their note for complete details. I concur with their findings with the following additions/corrections:  Patient with HIV, well controlled on current regimen and new MRSA bacteremia.  While graft did not appear grossly infected in the OR, it remains a concern to me. I greatly appreciate Cardiology seeing the pt for TEE and we will followup their study today.  Ordinarily I would like to add rifampin to her vancomycin in case the graft has an occult infection but there are TOO many drug drug interactions with her current ARV regimen and even if I were to change her for example to an integrase based regimen we would need to go up to much higher doses of isentress (800mg  bid) due to rifampin induction of metabolism of raltegravir.  Therefore I will simply avoid the rifampin altogether.  Even if TEE negative for endocarditis I would want to treat the patient for a minimum of 4 weeks if not 6 weeks.   We can arrange [facility] with me in [facility] in next week or [place] 02/16/2012, 1:53 PM

## 2012-02-17 ENCOUNTER — Encounter (HOSPITAL_COMMUNITY): Payer: Self-pay | Admitting: Internal Medicine

## 2012-02-17 LAB — RENAL FUNCTION PANEL
Albumin: 2.3 g/dL — ABNORMAL LOW (ref 3.5–5.2)
BUN: 33 mg/dL — ABNORMAL HIGH (ref 6–23)
CO2: 25 mEq/L (ref 19–32)
Calcium: 9.7 mg/dL (ref 8.4–10.5)
Chloride: 95 mEq/L — ABNORMAL LOW (ref 96–112)
Creatinine, Ser: 7.47 mg/dL — ABNORMAL HIGH (ref 0.50–1.10)
GFR calc Af Amer: 6 mL/min — ABNORMAL LOW (ref >90)
GFR calc non Af Amer: 5 mL/min — ABNORMAL LOW (ref >90)
Glucose, Bld: 120 mg/dL — ABNORMAL HIGH (ref 70–99)
Phosphorus: 5.1 mg/dL — ABNORMAL HIGH (ref 2.3–4.6)
Potassium: 4.4 mEq/L (ref 3.5–5.1)
Sodium: 134 mEq/L — ABNORMAL LOW (ref 135–145)

## 2012-02-17 LAB — CBC
HCT: 30 % — ABNORMAL LOW (ref 36.0–46.0)
Hemoglobin: 9.5 g/dL — ABNORMAL LOW (ref 12.0–15.0)
MCH: 34.7 pg — ABNORMAL HIGH (ref 26.0–34.0)
MCHC: 31.7 g/dL (ref 30.0–36.0)
MCV: 109.5 fL — ABNORMAL HIGH (ref 78.0–100.0)
Platelets: 253 10*3/uL (ref 150–400)
RBC: 2.74 MIL/uL — ABNORMAL LOW (ref 3.87–5.11)
RDW: 15.1 % (ref 11.5–15.5)
WBC: 7.6 10*3/uL (ref 4.0–10.5)

## 2012-02-17 LAB — GLUCOSE, CAPILLARY: Glucose-Capillary: 194 mg/dL — ABNORMAL HIGH (ref 70–99)

## 2012-02-17 LAB — VANCOMYCIN, RANDOM: Vancomycin Rm: 15.4 ug/mL

## 2012-02-17 LAB — MAGNESIUM: Magnesium: 2.3 mg/dL (ref 1.5–2.5)

## 2012-02-17 MED ORDER — MIDODRINE HCL 5 MG PO TABS
ORAL_TABLET | ORAL | Status: AC
  Filled 2012-02-17: qty 2

## 2012-02-17 MED ORDER — VANCOMYCIN HCL 500 MG IV SOLR
500.0000 mg | INTRAVENOUS | Status: AC
  Administered 2012-02-17: 500 mg via INTRAVENOUS
  Filled 2012-02-17: qty 500

## 2012-02-17 MED ORDER — SODIUM CHLORIDE 0.9 % IV SOLN: 125.0000 mg | INTRAVENOUS | Status: DC

## 2012-02-17 MED ORDER — BOOST / RESOURCE BREEZE PO LIQD: 1.0000 | Freq: Three times a day (TID) | ORAL | Status: DC

## 2012-02-17 MED ORDER — DOXERCALCIFEROL 4 MCG/2ML IV SOLN: 2.0000 ug | INTRAVENOUS | Status: DC

## 2012-02-17 MED ORDER — RENA-VITE PO TABS: 1.0000 | ORAL_TABLET | Freq: Every day | ORAL | Status: DC

## 2012-02-17 MED ORDER — DOXERCALCIFEROL 4 MCG/2ML IV SOLN
INTRAVENOUS | Status: AC
  Administered 2012-02-17: 2 ug via INTRAVENOUS
  Filled 2012-02-17: qty 2

## 2012-02-17 MED ORDER — CALCIUM ACETATE 667 MG PO CAPS: 1334.0000 mg | ORAL_CAPSULE | Freq: Three times a day (TID) | ORAL | Status: DC

## 2012-02-17 MED ORDER — VANCOMYCIN HCL 500 MG IV SOLR: 500.0000 mg | INTRAVENOUS | Status: DC

## 2012-02-17 NOTE — Progress Notes (Signed)
[name] discharged [facility] per MD order.  Discharge instructions reviewed and discussed with the patient, all questions and concerns answered. Copy of instructions and scripts given to patient.    Medication List    TAKE these medications       abacavir 300 MG tablet  Commonly known as:  ZIAGEN  Take 2 tablets (600 mg total) by mouth daily.     aspirin 81 MG tablet  Take 81 mg by mouth daily.     b complex vitamins tablet  Take 1 tablet by mouth daily.     calcium acetate 667 MG capsule  Commonly known as:  PHOSLO  Take 2 capsules (1,334 mg total) by mouth 3 (three) times daily with meals.     Darunavir Ethanolate 800 MG tablet  Commonly known as:  PREZISTA  Take 1 tablet (800 mg total) by mouth daily with breakfast.     doxercalciferol 4 MCG/2ML injection  Commonly known as:  HECTOROL  Inject 1 mL (2 mcg total) into the vein Every Tuesday,Thursday,and Saturday with dialysis.     feeding supplement Liqd  Take 237 mLs by mouth 2 (two) times daily between meals.     feeding supplement (NEPRO CARB STEADY) Liqd  Take 237 mLs by mouth as needed (missed meal during dialysis.).     feeding supplement Liqd  Take 1 Container by mouth 3 (three) times daily between meals.     folic acid [id number] MCG tablet  Commonly known as:  FOLVITE  Take 800 mcg by mouth daily.     guaiFENesin 100 MG/5ML Soln  Commonly known as:  ROBITUSSIN  Take 10 mLs (200 mg total) by mouth every 6 (six) hours as needed.     lamiVUDine 10 MG/ML solution  Commonly known as:  EPIVIR  Take 5 mLs (50 mg total) by mouth daily.     lidocaine-prilocaine cream  Commonly known as:  EMLA  Apply 1 application topically 3 (three) times a week. Apply to right arm 30 minutes before leaving for dialysis. Wrap with saran wrap. Dialysis days are Tuesdays, Thursdays, and Saturdays.     magic mouthwash Soln  Take by mouth 2 (two) times daily. With lunch and dinner     midodrine 10 MG tablet   Commonly known as:  PROAMATINE  Take 10 mg by mouth 2 (two) times daily at 10 AM and 5 PM.     multivitamin Tabs tablet  Take 1 tablet by mouth daily.     oxyCODONE 5 MG immediate release tablet  Commonly known as:  Oxy IR/ROXICODONE  Take 5 mg by mouth every 6 (six) hours as needed. For pain     ritonavir 100 MG Tabs  Commonly known as:  NORVIR  Take 1 tablet (100 mg total) by mouth daily with breakfast.     sodium chloride 0.9 % SOLN 100 mL with ferric gluconate 12.5 MG/ML SOLN 125 mg  Inject 125 mg into the vein Every Tuesday,Thursday,and Saturday with dialysis.     sodium chloride 0.9 % SOLN 100 mL with vancomycin 500 MG SOLR 500 mg  Inject 500 mg into the vein Every Tuesday,Thursday,and Saturday with dialysis.  Start taking on:  03/14/2012        Patients skin is clean, dry and intact, no evidence of skin break down. IV site discontinued and catheter remains intact. Site without signs and symptoms of complications. Dressing and pressure applied.  Patient escorted to ambulance in paper gown,  no  distress noted upon discharge.  Velora Mediate 02/17/2012 7:32 PM

## 2012-02-17 NOTE — H&P (Deleted)
Per [facility], we are to stick the RUA access above and below the surgical wound area. The arterial stick should be just above the antecubital area and the venous stick should be just lateral to the axilla.   Kelly Splinter  MD [phone] pgr    [phone] cell 02/17/2012, 10:58 AM   ROS  Physical Exam  Musculoskeletal:       Arms:

## 2012-02-17 NOTE — Discharge Summary (Signed)
Internal [facility] Discharge Note  Name: GERYL SCHUMANN MRN: [id number] DOB: 1955-11-13 57 y.o.  Date of Admission: 02/11/2012 11:26 PM Date of Discharge: 02/17/2012 Attending Physician: Truman Hayward, MD  Discharge Diagnosis: Principal Problem:   MRSA bacteremia Active Problems:   HIV INFECTION   ESRD (end stage renal disease)   HCAP (healthcare-associated pneumonia)  Discharge Medications:   Medication List    TAKE these medications       abacavir 300 MG tablet  Commonly known as:  ZIAGEN  Take 2 tablets (600 mg total) by mouth daily.     aspirin 81 MG tablet  Take 81 mg by mouth daily.     b complex vitamins tablet  Take 1 tablet by mouth daily.     calcium acetate 667 MG capsule  Commonly known as:  PHOSLO  Take 2 capsules (1,334 mg total) by mouth 3 (three) times daily with meals.     Darunavir Ethanolate 800 MG tablet  Commonly known as:  PREZISTA  Take 1 tablet (800 mg total) by mouth daily with breakfast.     doxercalciferol 4 MCG/2ML injection  Commonly known as:  HECTOROL  Inject 1 mL (2 mcg total) into the vein Every Tuesday,Thursday,and Saturday with dialysis.     feeding supplement Liqd  Take 237 mLs by mouth 2 (two) times daily between meals.     feeding supplement (NEPRO CARB STEADY) Liqd  Take 237 mLs by mouth as needed (missed meal during dialysis.).     feeding supplement Liqd  Take 1 Container by mouth 3 (three) times daily between meals.     folic acid A999333 MCG tablet  Commonly known as:  FOLVITE  Take 800 mcg by mouth daily.     guaiFENesin 100 MG/5ML Soln  Commonly known as:  ROBITUSSIN  Take 10 mLs (200 mg total) by mouth every 6 (six) hours as needed.     lamiVUDine 10 MG/ML solution  Commonly known as:  EPIVIR  Take 5 mLs (50 mg total) by mouth daily.     lidocaine-prilocaine cream  Commonly known as:  EMLA  Apply 1 application topically 3 (three) times a week. Apply to right arm 30 minutes before  leaving for dialysis. Wrap with saran wrap. Dialysis days are Tuesdays, Thursdays, and Saturdays.     magic mouthwash Soln  Take by mouth 2 (two) times daily. With lunch and dinner     midodrine 10 MG tablet  Commonly known as:  PROAMATINE  Take 10 mg by mouth 2 (two) times daily at 10 AM and 5 PM.     multivitamin Tabs tablet  Take 1 tablet by mouth daily.     oxyCODONE 5 MG immediate release tablet  Commonly known as:  Oxy IR/ROXICODONE  Take 5 mg by mouth every 6 (six) hours as needed. For pain     ritonavir 100 MG Tabs  Commonly known as:  NORVIR  Take 1 tablet (100 mg total) by mouth daily with breakfast.     sodium chloride 0.9 % SOLN 100 mL with ferric gluconate 12.5 MG/ML SOLN 125 mg  Inject 125 mg into the vein Every Tuesday,Thursday,and Saturday with dialysis.     sodium chloride 0.9 % SOLN 100 mL with vancomycin 500 MG SOLR 500 mg  Inject 500 mg into the vein Every Tuesday,Thursday,and Saturday with dialysis.  Start taking on:  03/14/2012       Disposition and follow-up:   Ms.Saraya A Kasprzak was discharged  from [facility] in Stable condition.  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 Tommy Medal  Follow-up Appointments: Follow-up Information   Follow up with Jessee Avers, MD On 03/03/2012. (@3pm )    Contact information:   [address] [place] [address] [phone]       Follow up with Alcide Evener, MD. Schedule an appointment as soon as possible for a visit on 03/01/2012. (@2pm )    Contact information:   [address] [place] [address] [phone]      Discharge Orders   Future Appointments Provider Department Dept Phone   03/01/2012 2:00 PM Truman Hayward, MD [facility] for Infectious Disease [phone]   03/03/2012 3:00 PM Jessee Avers, MD [place] [phone]   04/05/2012 10:00 AM Truman Hayward, MD [facility] for Infectious Disease [phone]   Future Orders Complete By Expires     (HEART FAILURE PATIENTS) Call MD:  Anytime you have any of the following symptoms: 1) 3 pound weight gain in 24 hours or 5 pounds in 1 week 2) shortness of breath, with or without a dry hacking cough 3) swelling in the hands, feet or stomach 4) if you have to sleep on extra pillows at night in order to breathe.  As directed     Call MD for:  redness, tenderness, or signs of infection (pain, swelling, redness, odor or green/yellow discharge around incision site)  As directed     Call MD for:  severe uncontrolled pain  As directed     Call MD for:  temperature >100.4  As directed     Diet - low sodium heart healthy  As directed       Consultations: Renal and Vascular Treatment Team:  Windy Kalata, MD  Procedures Performed:  Dg Chest 2 View  02/14/2012  *RADIOLOGY REPORT*  Clinical Data: Pneumonia.  Pulmonary edema.  History of CHF, HIV, hypertension.  CHEST - 2 VIEW  Comparison: 02/12/2012  Findings: Shallow inspiration.  Cardiac enlargement with normal pulmonary vascularity. Mild infiltration or atelectasis in the lung bases.  Improvement of left mid lung consolidation since previous study with minimal residual nodular changes present.  Suggestion of possible cystic or cavitary process developing in the left mid lung measuring 10 mm diameter.  IMPRESSION: Infiltration or atelectasis in both lung bases.  Improving infiltration in the left mid lung with developing cystic or cavitary process likely postinflammatory.  Cardiac enlargement.   Original Report Authenticated By: Lucienne Capers, M.D.    Dg Chest 2 View  02/12/2012  *RADIOLOGY REPORT*  Clinical Data: Fever and cough.  CHEST - 2 VIEW  Comparison: 12/21/2011  Findings: Significant interval improvement of parenchymal consolidation seen previously on the  left.  There is some residual infiltration in the left lower lung which could represent edema or recurrent pneumonia. Shallow inspiration.  Cardiac enlargement with pulmonary vascular congestion.  Slight interstitial changes likely to represent edema.  No blunting of costophrenic angles.  No pneumothorax.  Mediastinal contours appear intact.  IMPRESSION: Significant interval improvement of previous left upper lung consolidation.  Cardiac enlargement with pulmonary vascular congestion and edema.  Infiltration in the left lung may represent edema or recurrent pneumonia.   Original Report Authenticated By: Lucienne Capers, M.D.    2D Echo: Transesophageal Echocardiography  Patient:    Damarys, Lockrem MR #:       [id number] Study Date: 02/16/2012 Gender:     F Age:        73 Height:     162.6cm Weight:     63.2kg BSA:        1.10m^2 Pt. Status: Room:       [facility]     [place], [place], [place], Roderic Scarce  SONOGRAPHER  Audree Camel, RDCS, ARDMS cc:  ------------------------------------------------------------  ------------------------------------------------------------ Indications:      Endocarditis 421.9.  ------------------------------------------------------------ Study Conclusions  Left atrium:  No evidence of thrombus in the atrial cavity or appendage.  Impressions:  - Vegetation is absent. Transesophageal echocardiography.  2D and color Doppler. Height:  Height: 162.6cm. Height: 64in.  Weight:  Weight: 63.2kg. Weight: 139lb.  Body mass index:  BMI: 23.9kg/m^2. Body surface area:    BSA: 1.55m^2.  Blood pressure: 97/43.  Patient status:  Inpatient.  Location:  Endoscopy.   ------------------------------------------------------------  ------------------------------------------------------------ Left ventricle:  Normal LV systolic  function.  ------------------------------------------------------------ Aortic valve:  AV is mildly thickened. MIld AI.  ------------------------------------------------------------ Aorta:  Normal thoracic aorta.  ------------------------------------------------------------ Mitral valve:  Mitral valve is normal Trace MR.  ------------------------------------------------------------ Left atrium:   No evidence of thrombus in the atrial cavity or appendage.  ------------------------------------------------------------ Atrial septum:  No PFO present as tested by color doppler.   ------------------------------------------------------------ Pulmonic valve:   Pulmonic valve is normal. Trace PI.  ------------------------------------------------------------ Tricuspid valve:  TV is normal Trace TR.   ------------------------------------------------------------ Post procedure conclusions Ascending Aorta:  - Normal thoracic aorta.  ------------------------------------------------------------ Prepared and Electronically Authenticated by  Dorris Carnes [id number]:[id number]        Wall Scoring      2D Measurements    Dimensions                              LVIDD (cm)     LVIDS (cm)      IVS (cm)     LV PW (cm)      FS (%)     EF (%)      LA size (cm)     LA volume (cm3)      LV mass (g)     LV volume (cm3)                                            Aortic Root Measurements - End Diastolic                             Annulus (cm)     Sinus (cm)      STJ (cm)     Ao-prox (cm)      Ao-asc (cm)     Ao-arch (cm)                                            Main Pulmonary Measurements - End Diastolic  Annulus (cm)     MPA (cm)      LPA (cm)     RPA (cm)                                            Inferior Vena Cava                               Ostium (cm)     Proximal (cm)               Doppler Measurements    Aortic Valve                               Stenosis  Value  Regurgitation  Value   LVOT diam (cm)     SV 1 (cm3)      LVOT area (cm2)     SV 2 (cm3)      LVOT pk vel (m/s)     Reg frac (%)      LVOT VTI (cm)     P 1/2 time (m/s)      Ao pk vel (m/s)     Vena cont (cm)      Ao VTI (cm)         Valve area (cm2)     PISA   Mean grad (mmHg)     Vnyquist (m/s)      Peak grad (mmHg)     Radius (cm)      Index-nat (V1/V2)     MR max vel (m/s)      Index-pros (V1/V2)     Area-PISA (cm2)             HCM    LVOT grad (mmHg)       Grad Vals (mmHg)       Grad Amyl (mmHg)       Grad exer (mmHg)                                              Mitral Valve                   Stenosis  Value  Regurgitation  Value   Mean grad (mmHg)     SV 1 (cm3)      Peak grad (mmHg)     SV 2 (cm3)      Area-PISA (cm2)     Reg frac (%)      Area-2D (cm2)     P 1/2 time (m/s)      Area-P 1/2 (cm2)     Vena cont (cm)      Area-cont eq (cm2)             Stress Evaluation  PISA   Exer grad (mmHg)     Vnyquist (m/s)      Exer area (mmHg)     Radius (cm)      Exer PAP (mmHg)     MR max vel (m/s)         Area-PISA (cm2)           Tricuspid Valve Stenosis  Value  Regurgitation  Value  RVOT diam (cm)     SV 1 (cm3)      RVOT area (cm2)     SV 2 (cm3)      RVOT pk vel (m/s)     Reg frac (%)      RVOT VTI (cm)     Vena cont (cm)      TV VTI (cm)         Mean grad (mmHg)       Peak grad (mmHg)       Valve area (cm2)           Stress Evaluation  PISA   Rest PAP (mmHg)     Vnyquist (m/s)      Peak PAP (mmHg)     Radius (cm)         MR max vel (m/s)       Area-PISA (cm2)         Pulmonic Valve Stenosis  Value  Regurgitation  Value   Mean grad (mmHg)     SV 1 (cm3)      Peak grad (mmHg)     SV 2 (cm3)      Valve area (cm2)     Reg frac (%)         Diastolic Filing E/A ratio     E decel time (msec)      IVRT (msec)     MV"A" dur (msec)      Pulm vein S/D ratio     Pulm AR dur (msec)      TDI     E/e' ratio         Shunt  Ratio LVOT SV (cm3)     RVOT SV (cm3)      Qp:Qs ratio          Admission HPI: Patient is a 26 year woman well-known to service with completed a past history including HIV, ESRD, multiple infections including VRE bacteremia, C. difficile colitis, HCAP and multiple other problems as per problem list comes to the ED with cough and fever for past few days. The initial trigger for the patient to be in ED was bleeding from right upper extremity hemodialysis access- which stopped soon after patient arrived in ED.  Overall patient feels okay except cough. She has been coughing without any significant sputum production. Denies any chills or sweating. Her appetite has been okay. She has a private room in nursing home right now and so does not have any roommate who has been sick although she does report that a patient at the HD Center was sick.  She was found to have fever of 102 at nursing home yesterday.  She denies any chest pain, abdominal pain, headache, nausea vomiting, diarrhea, palpitations, vision changes.  Hospital Course by problem list: Principal Problem:   MRSA bacteremia--blood cx positive 2/6. Tmax 104F this admission.  Hx VRE. Was initially started on Vancomycin and cefepime, changed to Zyvox and Ancef, and now back to Vancomycin. Concerned for possible endocarditis but TEE was done 2/10 negative for vegetations. Vascular was also consulted for possible graft infection, and that was revised on 02/13/12 and not thought to be infected.  CXR 2/8 shows improving infiltration in left mid lung with developing cystic or cavitary process likely postinflammatory. Repeat blood cx x2 from 2/7 still negative growth to date and no growth from wound and graft revision.  She is to continue Vancomycin for 6 weeks total starting with day #1 of negative cultures.  Leukocytosis  and fevers resolved during hospital course.  Will need to follow up with pcp and ID--Dr. Tommy Medal as an outpatient.      HIV INFECTION--follows  in ID clinic.  Last CD4 count 340 on 02/04/12.  On home regimen of abacavir, prezista, epivir, and norvir.  Continued during admission and on discharge.  Follow up with ID--Dr. Tommy Medal scheduled in two weeks.      ESRD (end stage renal disease)--HD at [facility] on T,Th,Sat. Was 7kg above EDW on admission. Received last HD 2/11. 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 Will continue HD as normally scheduled as an outpatient and will receive vancomycin with HD.      HCAP (healthcare-associated pneumonia)--questionable LLL on admission xray.  Initially started on vancomycin and Cefepime but then eventually transitioned to Vancomycin in setting of MRSA bacteremia.  CXR on 2/8 showed improving infiltration with developing cystic or cavitary process.  Afebrile. Leukocytosis resolved.  o2 sat stable on room air ~97%.  Will continue on vancomycin for approximately 6 weeks.  Follow up with pcp advised.    Discharge Vitals:  BP 101/50  Pulse 67  Temp(Src) 97.8 F (36.6 C) (Oral)  Resp 20  Ht 5\' 4"  (1.626 m)  Wt 119 lb 4.3 oz (54.1 kg)  BMI 20.46 kg/m2  SpO2 97%  Discharge Labs:  Results for orders placed during the hospital encounter of 02/11/12 (from the past 24 hour(s))  RENAL FUNCTION PANEL     Status: Abnormal   Collection Time    02/17/12  6:59 AM      Result Value Range   Sodium 134 (*) 135 - 145 mEq/L   Potassium 4.4  3.5 - 5.1 mEq/L   Chloride 95 (*) 96 - 112 mEq/L   CO2 25  19 - 32 mEq/L   Glucose, Bld 120 (*) 70 - 99 mg/dL   BUN 33 (*) 6 - 23 mg/dL   Creatinine, Ser 7.47 (*) 0.50 - 1.10 mg/dL   Calcium 9.7  8.4 - 10.5 mg/dL   Phosphorus 5.1 (*) 2.3 - 4.6 mg/dL   Albumin 2.3 (*) 3.5 - 5.2 g/dL   GFR calc non Af Amer 5 (*) >90 mL/min   GFR calc Af Amer 6 (*) >90 mL/min  CBC     Status: Abnormal   Collection Time    02/17/12  6:59 AM      Result Value Range   WBC 7.6  4.0 - 10.5 K/uL   RBC 2.74 (*) 3.87 - 5.11 MIL/uL   Hemoglobin 9.5 (*) 12.0 - 15.0 g/dL    HCT 30.0 (*) 36.0 - 46.0 %   MCV 109.5 (*) 78.0 - 100.0 fL   MCH 34.7 (*) 26.0 - 34.0 pg   MCHC 31.7  30.0 - 36.0 g/dL   RDW 15.1  11.5 - 15.5 %   Platelets 253  150 - 400 K/uL  MAGNESIUM     Status: None   Collection Time    02/17/12  6:59 AM      Result Value Range   Magnesium 2.3  1.5 - 2.5 mg/dL  VANCOMYCIN, RANDOM     Status: None   Collection Time    02/17/12 11:39 AM      Result Value Range   Vancomycin Rm 15.4     Signed: Jerene Pitch 02/17/2012, 5:40 PM   Time Spent on Discharge: 35 minutes  Services Ordered on Discharge: HD Equipment Ordered on Discharge: none    Internal Medicine  Teaching Service Attending Note Date: 02/18/2012  Patient name: SEMIAH SISTARE  Medical record number: [id number]  Date of birth: [birth year]    This patient has been seen and discussed with the house staff. Please see their note for complete details. I concur with their findings with the following additions/corrections: Please see my  Note from earlier in the day Patient with complicated SAB and concern for graft infection (despite OR findings)  She will need 6 weeks of IV vancomycin with fu in [facility] with me  Her HIV is perfectly controlled.  Alcide Evener 02/18/2012, 1:22 PM

## 2012-02-17 NOTE — Progress Notes (Signed)
Subjective:   Seen on dialysis, no complaints.  Objective: Vital signs in last 24 hours: Temp:  [97 F (36.1 C)-98 F (36.7 C)] 97 F (36.1 C) (02/11 0640) Pulse Rate:  [54-76] 58 (02/11 0830) Resp:  [14-36] 18 (02/11 0830) BP: (93-139)/(43-63) 116/59 mmHg (02/11 0830) SpO2:  [96 %-100 %] 98 % (02/11 0640) Weight:  [56.3 kg (124 lb 1.9 oz)-62.999 kg (138 lb 14.2 oz)] 56.3 kg (124 lb 1.9 oz) (02/11 0640) Weight change: -0.001 kg (-0 oz)  Intake/Output from previous day: 02/10 0701 - 02/11 0700 In: 360 [P.O.:360] Out: -    EXAM: General appearance:  Alert, in no apparent distress Resp:  CTA without rales, rhonchi, or wheezes Cardio:  RRR with Gr II/VI systolic murmur, no rub  GI:  + BS, soft and nontender Extremities:  No edema Access:  AVG @ RUA with BFR 400 cc/min  Lab Results:  Recent Labs  02/16/12 0540 02/17/12 0659  WBC 7.3 7.6  HGB 9.4* 9.5*  HCT 30.7* 30.0*  PLT 227 253   BMET:  Recent Labs  02/16/12 0540 02/17/12 0659  NA 134* 134*  K 4.1 4.4  CL 95* 95*  CO2 28 25  GLUCOSE 77 120*  BUN 24* 33*  CREATININE 5.96* 7.47*  CALCIUM 10.1 9.7  ALBUMIN 2.3* 2.3*   No results found for this basename: PTH,  in the last 72 hours Iron Studies: No results found for this basename: IRON, TIBC, TRANSFERRIN, FERRITIN,  in the last 72 hours  Dialysis Orders: [facility] . EDW 64.5 HD Bath 2K/2.25Ca Time 4:00 Heparin 5900. Access RUA AVF BFR 400 DFR A1.5 Hectorol 2 mcg IV/HD Epogen 18,600 Units IV/HD Venofer 50 mg IV weekly  Assessment/Plan: 1. Fever - Blood cultures + for MRSA, started Vancomycin 2/7, s/p revision of RUA AVG by Dr. Scot Dock on 2/7, no evidence of infection; WBCs down to 7.6, now afebrile. 2. ESRD - HD on TTS @ [place]; K 4.4 pre-HD.  3. HTN/Volume - BP 116/59 on Midodrine 10 mg tid; current wt 63 kg with EDW 64.5. 4. Anemia - Hgb 9.5, s/p Aranesp 100 mcg on Sat and IV Fe qHD.[name] 5. Secondary hyperparathyroidism - Ca 9.7 (11.1 corrected), P 5.1,  Hectorol 4 mcg on hold secondary to high Ca, using 2Ca bath, Phoslo 2 with meals. 6. Nutrition - Alb 2.3, on liberalized diet, Nepro. 7. HIV - on antivirals per ID.      LOS: 6 days   LYLES,CHARLES 02/17/2012,9:09 AM  Patient seen and examined.  Agree with assessment and plan as above. Kelly Splinter  MD [phone] pgr    [phone] cell 02/17/2012, 11:01 AM

## 2012-02-17 NOTE — Clinical Social Work Note (Signed)
Patient medically stable for d/c back to [facility] today. Discharge information forwarded to facility and medical packed compiled and will accompany patient to facility. [name] will be transported by ambulance to SNF.  [name], MSW, LCSW [phone]

## 2012-02-17 NOTE — Progress Notes (Signed)
ANTIBIOTIC CONSULT NOTE - FOLLOW UP  Pharmacy Consult for Vancomycin Indication: MRSA Bacteremia  Allergies  Allergen Reactions  . Penicillins Rash    Unknown reaction    Patient Measurements: Height: 5\' 4"  (162.6 cm) Weight: 119 lb 4.3 oz (54.1 kg) IBW/kg (Calculated) : 54.7 Adjusted Body Weight:   Vital Signs: Temp: 96 F (35.6 C) (02/11 1025) Temp src: Oral (02/11 1025) BP: 109/54 mmHg (02/11 1025) Pulse Rate: 52 (02/11 1025) Intake/Output from previous day: 02/10 0701 - 02/11 0700 In: 360 [P.O.:360] Out: -  Intake/Output from this shift: Total I/O In: 3 [I.V.:3] Out: 1761 [Other:1761]  Labs:  Recent Labs  02/15/12 0519 02/16/12 0540 02/17/12 0659  WBC 10.9* 7.3 7.6  HGB 9.7* 9.4* 9.5*  PLT 203 227 253  CREATININE 4.37* 5.96* 7.47*   Estimated Creatinine Clearance: 7.3 ml/min (by C-G formula based on Cr of 7.47).  Recent Labs  02/17/12 1139  VANCORANDOM 15.4     Microbiology: Recent Results (from the past 720 hour(s))  CULTURE, BLOOD (ROUTINE X 2)     Status: None   Collection Time    02/12/12 12:05 AM      Result Value Range Status   Specimen Description BLOOD LEFT HAND   Final   Special Requests BOTTLES DRAWN AEROBIC AND ANAEROBIC 5CC EA   Final   Culture  Setup Time 02/12/2012 04:22   Final   Culture     Final   Value: METHICILLIN RESISTANT STAPHYLOCOCCUS AUREUS     Note: RIFAMPIN AND GENTAMICIN SHOULD NOT BE USED AS SINGLE DRUGS FOR TREATMENT OF STAPH INFECTIONS. CRITICAL RESULT CALLED TO, READ BACK BY AND VERIFIED WITH: ASHLEY TONEY 02/13/12 1400 BY SMITHERSJ This organism DOES NOT demonstrate inducible Clindamycin      resistance in vitro.     STAPHYLOCOCCUS AUREUS     Note: Gram Stain Report Called to,Read Back By and Verified With: Kendall Flack RN ON 02/12/2012 AT 7:30P BY WILEJ   Report Status 02/14/2012 FINAL   Final   Organism ID, Bacteria STAPHYLOCOCCUS AUREUS   Final  CULTURE, BLOOD (ROUTINE X 2)     Status: None   Collection Time   02/12/12 12:10 AM      Result Value Range Status   Specimen Description BLOOD LEFT ARM   Final   Special Requests BOTTLES DRAWN AEROBIC AND ANAEROBIC 5CC EA   Final   Culture  Setup Time 02/12/2012 04:22   Final   Culture     Final   Value: METHICILLIN RESISTANT STAPHYLOCOCCUS AUREUS     Note: SUSCEPTIBILITIES PERFORMED ON PREVIOUS CULTURE WITHIN THE LAST 5 DAYS.     Note: Gram Stain Report Called to,Read Back By and Verified With: Kendall Flack RN ON 02/12/2012 AT 7:30P BY WILEJ   Report Status 02/14/2012 FINAL   Final  CULTURE, BLOOD (ROUTINE X 2)     Status: None   Collection Time    02/13/12  1:30 PM      Result Value Range Status   Specimen Description BLOOD LEFT HAND   Final   Special Requests BOTTLES DRAWN AEROBIC ONLY [facility]   Final   Culture  Setup Time 02/13/2012 18:01   Final   Culture     Final   Value:        BLOOD CULTURE RECEIVED NO GROWTH TO DATE CULTURE WILL BE HELD FOR 5 DAYS BEFORE ISSUING A FINAL NEGATIVE REPORT   Report Status PENDING   Incomplete  WOUND CULTURE  Status: None   Collection Time    02/13/12  4:11 PM      Result Value Range Status   Specimen Description WOUND RIGHT ARM   Final   Special Requests SWAB OF PERI GRAFT FLUID, PT ON ZYVOS   Final   Gram Stain     Final   Value: RARE WBC PRESENT, PREDOMINANTLY PMN     RARE SQUAMOUS EPITHELIAL CELLS PRESENT     NO ORGANISMS SEEN   Culture NO GROWTH 2 DAYS   Final   Report Status 02/15/2012 FINAL   Final  ANAEROBIC CULTURE     Status: None   Collection Time    02/13/12  4:11 PM      Result Value Range Status   Specimen Description WOUND RIGHT ARM   Final   Special Requests SWAB OF PERI GRAFT FLUID, PT ON ZYVOS   Final   Gram Stain PENDING   Incomplete   Culture     Final   Value: NO ANAEROBES ISOLATED; CULTURE IN PROGRESS FOR 5 DAYS   Report Status PENDING   Incomplete    Anti-infectives   Start     Dose/Rate Route Frequency Ordered Stop   03/14/12 0000  sodium chloride 0.9 % SOLN 100 mL with  vancomycin 500 MG SOLR 500 mg     500 mg 100 mL/hr over 60 Minutes Intravenous Every T-Th-Sa (Hemodialysis) 02/17/12 1121 03/28/12 2359   02/19/12 1200  vancomycin (VANCOCIN) 500 mg in sodium chloride 0.9 % 100 mL IVPB     500 mg 100 mL/hr over 60 Minutes Intravenous Every T-Th-Sa (Hemodialysis) 02/17/12 0837     02/14/12 1200  vancomycin (VANCOCIN) 750 mg in sodium chloride 0.9 % 150 mL IVPB  Status:  Discontinued     750 mg 150 mL/hr over 60 Minutes Intravenous Every T-Th-Sa (Hemodialysis) 02/12/12 1327 02/13/12 0805   02/14/12 1200  ceFAZolin (ANCEF) IVPB 2 g/50 mL premix  Status:  Discontinued     2 g 100 mL/hr over 30 Minutes Intravenous Every T-Th-Sa (Hemodialysis) 02/13/12 0942 02/13/12 1430   02/13/12 1800  vancomycin (VANCOCIN) 750 mg in sodium chloride 0.9 % 150 mL IVPB  Status:  Discontinued     750 mg 150 mL/hr over 60 Minutes Intravenous Every T-Th-Sa (Hemodialysis) 02/13/12 1335 02/17/12 0837   02/13/12 1000  linezolid (ZYVOX) IVPB 600 mg  Status:  Discontinued     600 mg 300 mL/hr over 60 Minutes Intravenous Every 12 hours 02/13/12 0801 02/13/12 1319   02/13/12 0800  Darunavir Ethanolate (PREZISTA) tablet 800 mg     800 mg Oral Daily with breakfast 02/12/12 1328     02/13/12 0800  ritonavir (NORVIR) tablet 100 mg     100 mg Oral Daily with breakfast 02/12/12 1328     02/12/12 1800  ceFEPIme (MAXIPIME) 2 g in dextrose 5 % 50 mL IVPB  Status:  Discontinued     2 g 100 mL/hr over 30 Minutes Intravenous Every T-Th-S-Su (1800) 02/12/12 1327 02/13/12 0801   02/12/12 1400  abacavir (ZIAGEN) tablet 600 mg     600 mg Oral Daily 02/12/12 1328     02/12/12 1400  lamiVUDine (EPIVIR) 10 MG/ML solution 50 mg     50 mg Oral Daily 02/12/12 1328     02/12/12 0000  ceFEPIme (MAXIPIME) 2 g in dextrose 5 % 50 mL IVPB     2 g 100 mL/hr over 30 Minutes Intravenous  Once 02/11/12 2349 02/12/12 0102   02/12/12 0000  vancomycin (VANCOCIN) IVPB 1000 mg/200 mL premix  Status:  Discontinued      1,000 mg 200 mL/hr over 60 Minutes Intravenous  Once 02/11/12 2349 02/11/12 2353   02/12/12 0000  vancomycin (VANCOCIN) 1,500 mg in sodium chloride 0.9 % 500 mL IVPB     1,500 mg 250 mL/hr over 120 Minutes Intravenous  Once 02/11/12 2353 02/12/12 0352      Assessment: 56yof on Vancomycin Day 5 for MRSA bacteremia. Patient is currently afebrile and WBC wnl. Patient has ESRD and receives HD on TTS. Vancomycin level was drawn at the end of HD today and is at the upper end of goal range (15.4 mcg/ml, goal 5-15 mcg/ml) - will decrease Vancomycin doses to 500mg  with each dialysis to account for changing weights. ID has recommended Vancomycin therapy for 4-6 weeks. TEE performed 2/10 revealed no vegetations.   Goal of Therapy:  Pre-HD Vancomycin levels: 15-25 mcg/ml Post-HD Vancomycin levels: 5-15 mcg/ml  Plan:  1. Change Vancomycin to 500mg  IV qHD (TTS) - first dose now 2. Follow-up discharge plans and recommend follow-up Vancomycin level in 7-10 days  Earleen Newport [id number] 02/17/2012,1:16 PM

## 2012-02-17 NOTE — Progress Notes (Addendum)
Subjective: [name] was seen and examined this morning during her HD session.  She is s/p TEE yesterday that was negative for any vegetations.  She will likely be discharged back to SNF after HD.  She claims to be feeling fine and would like to return to her residence as soon as possible.   Afebrile over night.  She is POD#4 s/p revision of right upper arm AV graft.  She currently denies any chest pain, shortness of breath, chills, N/V/D, or abdominal pain at this time.  Objective: Vital signs in last 24 hours: Filed Vitals:   02/17/12 0640 02/17/12 0649 02/17/12 0720 02/17/12 0730  BP: 110/59 110/56 93/44 97/53   Pulse: 60 58 60 74  Temp: 97 F (36.1 C)     TempSrc: Oral     Resp: 20 18 18 20   Height:      Weight: 124 lb 1.9 oz (56.3 kg)     SpO2: 98%      Weight change: -0 oz (-0.001 kg)  Intake/Output Summary (Last 24 hours) at 02/17/12 0752 Last data filed at 02/17/12 0600  Gross per 24 hour  Intake    360 ml  Output      0 ml  Net    360 ml   Vitals reviewed. 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  +2dp b/l Neuro: alert and oriented X3, cranial nerves II-XII grossly intact, strength and sensation to light touch equal in bilateral upper and lower extremities  Lab Results: Basic Metabolic Panel:  Recent Labs Lab 02/15/12 0519 02/16/12 0540  NA 135 134*  K 4.5 4.1  CL 98 95*  CO2 23 28  GLUCOSE 78 77  BUN 18 24*  CREATININE 4.37* 5.96*  CALCIUM 9.3 10.1  MG 2.1 2.1  PHOS 4.5 5.1*   Liver Function Tests:  Recent Labs Lab 02/12/12 0025  02/15/12 0519 02/16/12 0540  AST 16  --   --  15  ALT <5  --   --  <5  ALKPHOS 107  --   --  97  BILITOT 0.4  --   --  0.3  PROT 9.3*  --   --  8.3  ALBUMIN 2.6*  < > 2.2*  2.3*  < > = values in this interval not displayed. CBC:  Recent Labs Lab 02/12/12 0025  02/16/12 0540 02/17/12 0659  WBC 10.9*  < > 7.3 7.6  NEUTROABS 8.0*  --   --   --   HGB 11.7*  < > 9.4* 9.5*  HCT 35.9*  < > 30.7* 30.0*  MCV 108.1*  < > 109.3* 109.5*  PLT 257  < > 227 253  < > = values in this interval not displayed. Micro Results: Recent Results (from the past 240 hour(s))  CULTURE, BLOOD (ROUTINE X 2)     Status: None   Collection Time    02/12/12 12:05 AM      Result Value Range Status   Specimen Description BLOOD LEFT HAND   Final   Special Requests BOTTLES DRAWN AEROBIC AND ANAEROBIC 5CC EA   Final   Culture  Setup Time 02/12/2012 04:22   Final   Culture     Final   Value: METHICILLIN RESISTANT STAPHYLOCOCCUS AUREUS  Note: RIFAMPIN AND GENTAMICIN SHOULD NOT BE USED AS SINGLE DRUGS FOR TREATMENT OF STAPH INFECTIONS. CRITICAL RESULT CALLED TO, READ BACK BY AND VERIFIED WITH: ASHLEY TONEY 02/13/12 1400 BY SMITHERSJ This organism DOES NOT demonstrate inducible Clindamycin      resistance in vitro.     STAPHYLOCOCCUS AUREUS     Note: Gram Stain Report Called to,Read Back By and Verified With: Kendall Flack RN ON 02/12/2012 AT 7:30P BY WILEJ   Report Status 02/14/2012 FINAL   Final   Organism ID, Bacteria STAPHYLOCOCCUS AUREUS   Final  CULTURE, BLOOD (ROUTINE X 2)     Status: None   Collection Time    02/12/12 12:10 AM      Result Value Range Status   Specimen Description BLOOD LEFT ARM   Final   Special Requests BOTTLES DRAWN AEROBIC AND ANAEROBIC 5CC EA   Final   Culture  Setup Time 02/12/2012 04:22   Final   Culture     Final   Value: METHICILLIN RESISTANT STAPHYLOCOCCUS AUREUS     Note: SUSCEPTIBILITIES PERFORMED ON PREVIOUS CULTURE WITHIN THE LAST 5 DAYS.     Note: Gram Stain Report Called to,Read Back By and Verified With: Kendall Flack RN ON 02/12/2012 AT 7:30P BY WILEJ   Report Status 02/14/2012 FINAL   Final  CULTURE, BLOOD (ROUTINE X 2)     Status: None    Collection Time    02/13/12  1:30 PM      Result Value Range Status   Specimen Description BLOOD LEFT HAND   Final   Special Requests BOTTLES DRAWN AEROBIC ONLY [place]   Final   Culture  Setup Time 02/13/2012 18:01   Final   Culture     Final   Value:        BLOOD CULTURE RECEIVED NO GROWTH TO DATE CULTURE WILL BE HELD FOR 5 DAYS BEFORE ISSUING A FINAL NEGATIVE REPORT   Report Status PENDING   Incomplete  WOUND CULTURE     Status: None   Collection Time    02/13/12  4:11 PM      Result Value Range Status   Specimen Description WOUND RIGHT ARM   Final   Special Requests SWAB OF PERI GRAFT FLUID, PT ON ZYVOS   Final   Gram Stain     Final   Value: RARE WBC PRESENT, PREDOMINANTLY PMN     RARE SQUAMOUS EPITHELIAL CELLS PRESENT     NO ORGANISMS SEEN   Culture NO GROWTH 2 DAYS   Final   Report Status 02/15/2012 FINAL   Final  ANAEROBIC CULTURE     Status: None   Collection Time    02/13/12  4:11 PM      Result Value Range Status   Specimen Description WOUND RIGHT ARM   Final   Special Requests SWAB OF PERI GRAFT FLUID, PT ON ZYVOS   Final   Gram Stain PENDING   Incomplete   Culture     Final   Value: NO ANAEROBES ISOLATED; CULTURE IN PROGRESS FOR 5 DAYS   Report Status PENDING   Incomplete   Medications: I have reviewed the patient's current medications. Scheduled Meds: . abacavir  600 mg Oral Daily  . B-complex with vitamin C  1 tablet Oral Daily  . calcium acetate  1,334 mg Oral TID WC  . darbepoetin (ARANESP) injection - DIALYSIS  100 mcg Intravenous Q Sat-HD  . Darunavir Ethanolate  800 mg Oral Q breakfast  . doxercalciferol  2 mcg Intravenous Q T,Th,Sa-HD  . feeding supplement  1 Container Oral TID BM  . ferric gluconate (FERRLECIT/NULECIT) IV  125 mg Intravenous Q T,Th,Sa-HD  . heparin  5,000 Units Subcutaneous Q8H  . lamiVUDine  50 mg Oral Daily  . lidocaine-prilocaine  1 application Topical 3 times weekly  . magic mouthwash  5 mL Oral BID  . midodrine  10 mg Oral TID WC   . multivitamin  1 tablet Oral Daily  . ritonavir  100 mg Oral Q breakfast  . sodium chloride  3 mL Intravenous Q12H  . 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  Hx VRE.  Was initially started on Vancomycin and cefepime, changed to Zyvox and Ancef, and now back to Vancomycin.  TEE 2/10 negative for vegetations.  CXR 2/8 shows improving infiltration in left mid lung with developing cystic or cavitary process likely postinflammatory.   -f/u repeat blood cx x2 2/7--NGTD -Continue vancomycin for recommended 6 weeks total--day 6, will not add Rifampin at this time -check daily for "metastatic" site of infection   2) Sepsis--secondary to MRSA Bacteremia, ?HCAP-- LLL PNA (per xray), and ?infected graft in setting of ESRD and HIV disease.  POD#4 s/p revision of RUA AV graft--graft revised, no clear evidence of infection.  Hx of VRE.  T max 104F in ED.   Afebrile, leukocytosis resolved, and BP stable.  Lactate down to 1.0 2/8 and pro-calcitonin 2.85. CXR after HD: improving left mid lung infiltration with developing cystic or cavitary process likely postinflammatory  - PCCM following.   - monitor BP--hx of chronic hypotension, on Midodrine.  No need for pressors or stress steroid at this point in time. Monitoring closely - continue Vancomycin day 5--total 6 weeks - blood cultures x2 2/7--NGTD - av graft wound cultures pending--NGTD - vascular  following, OR 2/7 for graft revision, using graft for HD, can cannulate above and below 2 incisions for now.  In 4 weeks can cannulate between both incisions per vascular.    3) Chronic hypotension at baseline.  On Midodrine 10mg  po TID.  No role for pressors at this time.   -BP Stable, continue to monitor  4) ESRD: HD at [facility] on T,Th,Sat. Was 7kg above EDW on admission.  Received last HD 2/6.  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   - HD today - Renal following for HD.   5) HIV: Recently saw Dr. Drucilla Schmidt on 02/04/2012 when CD4 count was 320.  - Continue home HIV medications--will not make changes to regimen at this time and will not add Rifampin dur to multiple drug interactions.     6) Systolic Cardiomyopathy: Compensated. Last EF 35-40% in August 2013   7) Anemia--Hb 11.7 on admission, on EPO and Fe per renal.  Tsat 15% and Ferritin 585 on 1/28 per renal -Trend cbc -Aranesp per renal  Recent Labs Lab 02/15/12 0519 02/16/12 0540 02/17/12 0659  HGB 9.7* 9.4* 9.5*  HCT 30.0* 30.7* 30.0*  WBC 10.9* 7.3 7.6  PLT 203 227 253   8) Malnutrition--albumin 2.3.   -nutrition following--resource breeze  daily  9) Metabolic bone disease--PTH 180.9 on 1/28.   -continue Phoslo and Hectorol per renal  Diet: Renal diet DVT prophylaxis: Heparin.  Dispo: likely d/c back to SNF today after HD  The patient does have a current PCP Cathren Laine, ANKIT, MD), therefore will be requiring OPC follow-up after discharge.  The patient does not know have transportation limitations that hinder transportation to clinic appointments.  Services Needed at time of discharge: Y = Yes, Blank = No PT:   OT:   RN:   Equipment:   Other:     LOS: 6 days   Jerene Pitch 02/17/2012, 7:52 AM  Internal Medicine Teaching Service Attending Note Date: 02/17/2012  Patient name: [name]  Medical record number: [id number]  Date of birth: 22-Feb-1955    This patient has been seen and  discussed with the house staff. Please see their note for complete details. I concur with their findings with the following additions/corrections:  Greatly appreciate Cardiology peformign TEEE which was negative for vegetations  Will plan on 6 weeks of IV vancomycin with HD with day #1 being date of negative cultures  I will ensure that she has fu with me in [facility] as well.  Alcide Evener 02/17/2012, 11:07 AM

## 2012-02-17 NOTE — Progress Notes (Signed)
Hemodialysis: Late entry: At [phone] received call from Patsey Berthold, Pharmd r/t vanc level drawn prior dialysis which was scheduled thia am at 0500. Informed vanc level was not drawn during dialysis; [place] pharmd stated to draw vanc level post hd; will cancel current vanc order & reorder vanc to be given at later time. Stated will communicate to floor nurse to administer vanc on floor. Informed Charles lyles,P.A. To make aware of pharmacy recommendations.

## 2012-02-17 NOTE — Clinical Social Work Psychosocial (Signed)
Clinical Social Work Department BRIEF PSYCHOSOCIAL ASSESSMENT 02/17/2012  Patient:  [name], [name]     Account Number:  [id number]     Admit date:  02/11/2012  Clinical Social Worker:  Frederico Hamman  Date/Time:  02/17/2012 06:43 AM  Referred by:  Physician  Date Referred:  02/13/2012 Referred for  SNF Placement   Other Referral:   Interview type:  Other - See comment Other interview type:   CSW talked with admissions staff at [place] Admitted from facility:  [facility] Level of care:  [place] Primary support name:   Primary support relationship to patient:   Degree of support available:    CURRENT CONCERNS Current Concerns  Post-Acute Placement   Other Concerns:    SOCIAL WORK ASSESSMENT / PLAN Patient LTC resident at [facility] and will return at discharge.   Assessment/plan status:  No Further Intervention Required Other assessment/ plan:   Information/referral to community resources:    PATIENT'S/FAMILY'S RESPONSE TO PLAN OF CARE: Patient aware of d/c back to facility and in agreement.

## 2012-02-17 NOTE — Progress Notes (Signed)
Pt being d/c'd to [facility] will be after shift changed. Ambulance called. Iv d/c'd.  Report called to [place] RN.

## 2012-02-17 NOTE — Progress Notes (Signed)
    [place] for Infectious Disease  Specific aspects of clinical management are critical to optimizing the outcome of patients with SAB. Therefore, the [facility] Health Antimicrobial Management Team (CHAMP) have initiated an intervention aimed at improving the management of SAB at [facility]. To do so, Infectious Diseases Consultants are providing evidence-based recommendations for the management of all patients with SAB. The specific recommendations for this patient are marked "X" in this document.   Recommended [ x ] Completed [2/7//14] Perform follow-up blood cultures (even if the patient is afebrile) to ensure clearance of bacteremia.   Recommended [ ]  Completed [date] Remove vascular catheter, and obtain follow-up cultures after removal of catheter   NA   Recommended [x ] Completed [02/16/12] Perform echocardiography to evaluate for endocarditis (transthoracic ECHO is 40-50% sensitive, TEE is > 90% sensitive).*  Please keep in mind, that neither test can definitively EXCLUDE endocarditis, and that should clinical suspicion remain high for endocarditis the patient should then still be treated with an "endocarditis" duration of therapy = 6 weeks   TEE without vegetations  Recommended [ ]  Completed [date] Consult electrophysiologist to evaluate implanted cardiac device (pacemaker, ICD)   NA   Recommended [x ] Completed [2/7//14] Ensure source control.  Have all abscesses been drained effectively?  Have deep seeded infections (septic joints or osteomyelitis) had appropriate surgical debridement?   Pt sp [facility] surgery on 02/13/12 without overt evidence of infection in graft   Recommended [x ] Completed [daily] Investigate for "metastatic" sites of infection.  Does the patient have ANY symptom or physical exam finding that would suggest a deeper infection (back or neck pain that may be suggestive of vertebral osteomyelitis or epidural abscess, muscle pain that could be a symptom of  pyomyositis)?  Keep in mind that for deep seeded infections MRI imaging with contrast is preferred rather than other often insensitive tests such as plain x-rays, especially early in a patient's presentation.   No clinical evidence for metastatic infection   Recommended [x ] Vancomycin vancomycin, goal trough should be 15 - 20 mg/L)  [ x ] Estimated duration of IV Antibiotic therapy: 6 weeks (dependent on diagnostic test results).  [x ] Consult case management for probable prolonged outpatient intravenous antibiotic therapy.   Patient should be able to get Vancomycin with HD.    Will need to watch for risk of relaps of bacteremia when off IV vancomycin

## 2012-02-17 NOTE — Progress Notes (Signed)
Per [facility], we are to stick the RUA access above and below the surgical wound area. The arterial stick should be just above the antecubital area and the venous stick should be just lateral to the axilla. See also below    Kelly Splinter MD  [phone] pgr [phone] cell  02/17/2012, 10:58 AM   Physical Exam  Musculoskeletal:  Arms:

## 2012-02-20 LAB — ANAEROBIC CULTURE

## 2012-02-20 LAB — CULTURE, BLOOD (ROUTINE X 2): Culture: NO GROWTH

## 2012-03-01 ENCOUNTER — Ambulatory Visit (INDEPENDENT_AMBULATORY_CARE_PROVIDER_SITE_OTHER): Payer: Medicaid Other | Admitting: Infectious Disease

## 2012-03-01 ENCOUNTER — Encounter: Payer: Self-pay | Admitting: Infectious Disease

## 2012-03-01 VITALS — BP 151/83 | HR 76 | Temp 98.3°F | Ht 64.0 in | Wt 150.2 lb

## 2012-03-01 DIAGNOSIS — B20 Human immunodeficiency virus [HIV] disease: Secondary | ICD-10-CM

## 2012-03-01 DIAGNOSIS — A0472 Enterocolitis due to Clostridium difficile, not specified as recurrent: Secondary | ICD-10-CM

## 2012-03-01 DIAGNOSIS — A4902 Methicillin resistant Staphylococcus aureus infection, unspecified site: Secondary | ICD-10-CM

## 2012-03-01 DIAGNOSIS — R7881 Bacteremia: Secondary | ICD-10-CM

## 2012-03-01 NOTE — Progress Notes (Signed)
  Subjective:    Patient ID: [name], female    DOB: 04/30/1955, 57 y.o.   MRN: [id number]  HPI  57 year old African American lady with HIV currently perfectly controlled and ESRD on HD with yet another episode of MRSA bacteremia. She was admitted to my service and underwent exploration and revision of her graft by VVS with concern for infection though no obvious infection was found in the graft. She had TEE which failed to show any evidence of vegetation. He cleared her bacteremia by the 7th of February= date of her graft revisions as well and has continued on IV vancomycin with HD since then. She is residing in [facility]. She is doing well with no fevers, chills or malaise. She has no pain in any joints and no back pain. She denies diarrhea. She is adherent with ARV given in SNF.    Review of Systems  Constitutional: Negative for fever, chills, diaphoresis, activity change, appetite change, fatigue and unexpected weight change.  HENT: Negative for congestion, sore throat, rhinorrhea, sneezing, trouble swallowing and sinus pressure.   Eyes: Negative for photophobia and visual disturbance.  Respiratory: Negative for cough, chest tightness, shortness of breath, wheezing and stridor.   Cardiovascular: Negative for chest pain, palpitations and leg swelling.  Gastrointestinal: Negative for nausea, vomiting, abdominal pain, diarrhea, constipation, blood in stool, abdominal distention and anal bleeding.  Genitourinary: Negative for dysuria, hematuria, flank pain and difficulty urinating.  Musculoskeletal: Negative for myalgias, back pain, joint swelling, arthralgias and gait problem.  Skin: Negative for color change, pallor, rash and wound.  Neurological: Negative for dizziness, tremors, weakness and light-headedness.  Hematological: Negative for adenopathy. Does not bruise/bleed easily.  Psychiatric/Behavioral: Negative for behavioral problems, confusion, sleep disturbance, dysphoric  mood, decreased concentration and agitation.       Objective:   Physical Exam  Constitutional: She is oriented to person, place, and time. No distress.  HENT:  Head: Normocephalic and atraumatic.  Mouth/Throat: Oropharynx is clear and moist. No oropharyngeal exudate.  Eyes: Conjunctivae and EOM are normal. Pupils are equal, round, and reactive to light. No scleral icterus.  Neck: Normal range of motion. Neck supple. No JVD present.  Cardiovascular: Normal rate, regular rhythm and normal heart sounds.  Exam reveals no gallop and no friction rub.   No murmur heard. Pulmonary/Chest: Effort normal and breath sounds normal. No respiratory distress. She has no wheezes. She has no rales. She exhibits no tenderness.  Abdominal: She exhibits no distension and no mass. There is no tenderness. There is no rebound and no guarding.  Musculoskeletal: She exhibits no edema and no tenderness.  Lymphadenopathy:    She has no cervical adenopathy.  Neurological: She is alert and oriented to person, place, and time. She has normal reflexes. She exhibits normal muscle tone. Coordination normal.  Skin: Skin is warm and dry. She is not diaphoretic. No erythema. No pallor.     Psychiatric: She has a normal mood and affect. Her behavior is normal. Judgment and thought content normal.          Assessment & Plan:  HIV: perfectly controlled, recheck labs when she comes back for return visit  MRSA bacteremia: continue vancomycin with HD to complete 42 days of antibiotics after surgery = 03/26/12. Will see her two weeks later to check surveilance cultures  Hx of C difficile colitis: watch for recurrence

## 2012-03-01 NOTE — Patient Instructions (Addendum)
Continue with IV vancomycin for 6 weeks postoperatively with last dose on March the 20th  Rtc with dr. Tommy Medal on April the 9th at 1115

## 2012-03-03 ENCOUNTER — Ambulatory Visit (INDEPENDENT_AMBULATORY_CARE_PROVIDER_SITE_OTHER): Payer: Medicaid Other | Admitting: Internal Medicine

## 2012-03-03 ENCOUNTER — Encounter: Payer: Self-pay | Admitting: Internal Medicine

## 2012-03-03 DIAGNOSIS — J42 Unspecified chronic bronchitis: Secondary | ICD-10-CM

## 2012-03-03 DIAGNOSIS — N186 End stage renal disease: Secondary | ICD-10-CM

## 2012-03-03 DIAGNOSIS — R7881 Bacteremia: Secondary | ICD-10-CM

## 2012-03-03 DIAGNOSIS — J189 Pneumonia, unspecified organism: Secondary | ICD-10-CM

## 2012-03-03 DIAGNOSIS — A4902 Methicillin resistant Staphylococcus aureus infection, unspecified site: Secondary | ICD-10-CM

## 2012-03-03 DIAGNOSIS — E46 Unspecified protein-calorie malnutrition: Secondary | ICD-10-CM

## 2012-03-03 MED ORDER — GUAIFENESIN 100 MG/5ML PO SOLN: 10.0000 mL | Freq: Four times a day (QID) | ORAL | Status: DC | PRN

## 2012-03-03 NOTE — Assessment & Plan Note (Signed)
She is complaining of an irritating cough every now and then, over the last 2 months. She does not have any other features to suggest a pneumonia. Her physical examination is negative for respiratory distress, desaturation or abnormal physical examination on lung auscultation. I suspect this is a bronchitis.  Plan. -Will treat symptomatically with Robitussin.

## 2012-03-03 NOTE — Assessment & Plan Note (Signed)
She denies any history of fevers or chills, fatigue, or poor appetite. She is continuing with vancomycin, which is given with dialysis on Tuesdays, Thursdays, and Saturdays. Physical examination - she is afebrile. Her vascular dialysis access site is clean without any signs of infection.  Plan. -Will continue vancomycin until 03/25/2012. -She'll followup with Dr. Lucianne Lei dam on her next appointment, although 9th of April 2014.

## 2012-03-03 NOTE — Patient Instructions (Signed)
Please follow up with Dr Cathren Laine in about 4 weeks  Please take Robitussin for the cough  Please keep appointment with Dr Lucianne Lei dam  You will need a chest x ray in you next appointment to make sure that your pneumonia cleared    Bronchitis Bronchitis is a problem of the air tubes leading to your lungs. This problem makes it hard for air to get in and out of the lungs. You may cough a lot because your air tubes are narrow. Going without care can cause lasting (chronic) bronchitis. HOME CARE   Drink enough fluids to keep your pee (urine) clear or pale yellow.  Use a cool mist humidifier.  Quit smoking if you smoke. If you keep smoking, the bronchitis might not get better.  Only take medicine as told by your doctor. GET HELP RIGHT AWAY IF:   Coughing keeps you awake.  You start to wheeze.  You become more sick or weak.  You have a hard time breathing or get short of breath.  You cough up blood.  Coughing lasts more than 2 weeks.  You have a fever.  Your baby is older than 3 months with a rectal temperature of 102 F (38.9 C) or higher.  Your baby is 50 months old or younger with a rectal temperature of 100.4 F (38 C) or higher. MAKE SURE YOU:  Understand these instructions.  Will watch your condition.  Will get help right away if you are not doing well or get worse. Document Released: 06/11/2007 Document Revised: 03/17/2011 Document Reviewed: 11/24/2008 [facility] Patient Information 2013 [place].

## 2012-03-03 NOTE — Assessment & Plan Note (Signed)
She will continue with hemodialysis on Tuesday, Thursdays, and Saturdays. She had a session yesterday.

## 2012-03-03 NOTE — Progress Notes (Signed)
Patient ID: [name], female   DOB: 1955-02-11, 57 y.o.   MRN: [id number]  Subjective:   Patient ID: [name] female   DOB: 1955-10-25 56 y.o.   MRN: [id number]  HPI: [name] is a 56 y.o. history of HIV will, hypertension, end-stage renal disease on dialysis, and the recent bacteremia with MRSA presents for hospital followup visit. She was discharged on 02/17/2011. She is a resident of a [facility].  The patient reports that she is feeling good today. However, she reports some irritating cough that has been ongoing since December 2013. She denies any accompanying chest pain, shortness of breath, mucus production, or sputum. She has no fevers or chills. The cough is  dry, and it's repeatedly happens about a few times a day, and she needs some remedy for it. No history of recent sick contacts. She denies nausea or vomiting. Her appetite is good. She has good bowel movements.  Her chest x-ray during hospitalization 3 weeks ago, raised the question left lower lobe pneumonia. A repeat showed some improvement in aeration of her left lower lobe.  She is following up closely with her dialysis. Her last session was yesterday and she has another one tomorrow. Her dialysis access is not bothersome.  Please see the A&P for the status of the pt's other chronic medical problems.   Past Medical History  Diagnosis Date  . Human immunodeficiency virus (HIV) disease   . Hypertension   . Clostridium difficile infection 04/04/10    07/2011 and 08/2011  . Bacteriuria, asymptomatic 04/04/10    Culture grew VRE sensitive to linesolid   . MGUS (monoclonal gammopathy of unknown significance)   . History of bacteremia     MSSA; 05/2011, 08/2011  . Anemia   . Systolic heart failure     08/27/11 EF 35-40%  . ESRD (end stage renal disease) 03/19/2007    Started HD in 2010.  ESRD was due to HIV per pt. Gets HD on [address] [employer] [address] at [facility] on TTS schedule.     . Cancer    Current  Outpatient Prescriptions  Medication Sig Dispense Refill  . abacavir (ZIAGEN) 300 MG tablet Take 2 tablets (600 mg total) by mouth daily.  60 tablet  11  . Alum & Mag Hydroxide-Simeth (MAGIC MOUTHWASH) SOLN Take by mouth 2 (two) times daily. With lunch and dinner      . aspirin 81 MG tablet Take 81 mg by mouth daily.        [name] b complex vitamins tablet Take 1 tablet by mouth daily.        . calcium acetate (PHOSLO) 667 MG capsule Take 2 capsules (1,334 mg total) by mouth 3 (three) times daily with meals.      . Darunavir Ethanolate (PREZISTA) 800 MG tablet Take 1 tablet (800 mg total) by mouth daily with breakfast.  30 tablet  0  . doxercalciferol (HECTOROL) 4 MCG/2ML injection Inject 1 mL (2 mcg total) into the vein Every Tuesday,Thursday,and Saturday with dialysis.  2 mL    . feeding supplement (ENSURE COMPLETE) LIQD Take 237 mLs by mouth 2 (two) times daily between meals.  60 Bottle  11  . feeding supplement (RESOURCE BREEZE) LIQD Take 1 Container by mouth 3 (three) times daily between meals.      . folic acid (FOLVITE) A999333 MCG tablet Take 800 mcg by mouth daily.      [name] lamiVUDine (EPIVIR) 10 MG/ML solution  Take 5 mLs (50 mg total) by mouth daily.  240 mL    . lidocaine-prilocaine (EMLA) cream Apply 1 application topically 3 (three) times a week. Apply to right arm 30 minutes before leaving for dialysis. Wrap with saran wrap. Dialysis days are Tuesdays, Thursdays, and Saturdays.      . midodrine (PROAMATINE) 10 MG tablet Take 10 mg by mouth 2 (two) times daily at 10 AM and 5 PM.      . multivitamin (RENA-VIT) TABS tablet Take 1 tablet by mouth daily.      . Nutritional Supplements (FEEDING SUPPLEMENT, NEPRO CARB STEADY,) LIQD Take 237 mLs by mouth as needed (missed meal during dialysis.).  30 Can  11  . oxyCODONE (OXY IR/ROXICODONE) 5 MG immediate release tablet Take 5 mg by mouth every 6 (six) hours as needed. For pain      . ritonavir (NORVIR) 100 MG TABS Take 1 tablet (100 mg total) by mouth  daily with breakfast.  30 tablet  0  . sodium chloride 0.9 % SOLN 100 mL with ferric gluconate 12.5 MG/ML SOLN 125 mg Inject 125 mg into the vein Every Tuesday,Thursday,and Saturday with dialysis.      Derrill Memo ON 03/14/2012] sodium chloride 0.9 % SOLN 100 mL with vancomycin 500 MG SOLR 500 mg Inject 500 mg into the vein Every Tuesday,Thursday,and Saturday with dialysis.      [name] guaiFENesin (ROBITUSSIN) 100 MG/5ML SOLN Take 10 mLs (200 mg total) by mouth every 6 (six) hours as needed.  1200 mL  0  . sodium chloride 0.9 % SOLN 150 mL with vancomycin 1000 MG SOLR 750 mg Inject 750 mg into the vein once.       No current facility-administered medications for this visit.   Family History  Problem Relation Age of Onset  . Alcohol abuse      family h/o addiction/alcoholism  . Diabetes Cousin     first degree relatives  . Kidney disease Mother   . Kidney disease Maternal Uncle   . Cancer Sister    History   Social History  . Marital Status: Single    Spouse Name: N/A    Number of Children: N/A  . Years of Education: N/A   Social History Main Topics  . Smoking status: Never Smoker   . Smokeless tobacco: Never Used  . Alcohol Use: No  . Drug Use: Yes    Special: Cocaine     Comment: quit 83yrs ago  . Sexually Active: No   Other Topics Concern  . None   Social History Narrative   Lives with daughter(does not know dx). Oldest daughter does not dx.   Current partner of may years,? fidelity   Review of Systems: Constitutional: Denies fever, chills, diaphoresis, appetite change and fatigue.  HEENT: Denies photophobia, eye pain, redness, hearing loss, ear pain, congestion, sore throat, rhinorrhea, sneezing, mouth sores, trouble swallowing, neck pain, neck stiffness and tinnitus.   Respiratory: Denies SOB, DOE, cough, chest tightness,  and wheezing.   Cardiovascular: Denies chest pain, palpitations and leg swelling.  Gastrointestinal: Denies nausea, vomiting, abdominal pain, diarrhea,  constipation, blood in stool and abdominal distention.  Musculoskeletal: Denies myalgias, back pain, joint swelling, arthralgias and gait problem.  Skin: Denies pallor, rash and wound.  Neurological: Denies dizziness, seizures, syncope, weakness, light-headedness, numbness and headaches.  Hematological: Denies adenopathy. Easy bruising, personal or family bleeding history  Psychiatric/Behavioral: Denies suicidal ideation, mood changes, confusion, nervousness, sleep disturbance and agitation  Objective:  Physical Exam:  Blood pressure 116/68, pulse rate of 84 beats per minute, temperature is 97.26F, oxygen saturation of 98% on room air. Her weight is 148.5 pounds.  Constitutional: Vital signs reviewed.  Patient is a well-developed and well-nourished in no acute distress and cooperative with exam. Alert and oriented x3.  Head: Normocephalic and atraumatic Ear: TM normal bilaterally Mouth: no erythema or exudates, MMM Eyes: PERRL, EOMI, conjunctivae normal, No scleral icterus.  Neck: Supple, Trachea midline normal ROM, No JVD, mass, thyromegaly, or carotid bruit present.  Cardiovascular: RRR, S1 normal, S2 normal, no MRG, pulses symmetric and intact bilaterally Pulmonary/Chest: CTAB, no wheezes, rales, or rhonchi Abdominal: Soft. Non-tender, non-distended, bowel sounds are normal, no masses, organomegaly, or guarding present.  GU: no CVA tenderness A right upper extremity: Vascular HD access site looks clean without any areas of erythema, swelling, warmth or soiling. It is non tender on palpation. Musculoskeletal: No joint deformities, erythema, or stiffness, ROM full and no nontender Hematology: no cervical, inginal, or axillary adenopathy.  Neurological: A&O x3, Strength is normal and symmetric bilaterally, cranial nerve II-XII are grossly intact, no focal motor deficit, sensory intact to light touch bilaterally.  Skin: Warm, dry and intact. No rash, cyanosis, or clubbing.  Psychiatric:  Normal mood and affect. speech and behavior is normal. Judgment and thought content normal. Cognition and memory are normal.   Assessment & Plan:   I have discussed my assessment and plan for the care of Mr. Tivis with Dr. Marinda Elk as detailed under my problem based charting.  In brief, she has done well since she was discharged. Her irritating cough, is most likely from a  bronchitis, and I'll give her some Robitussin for symptomatic relief. Will repeat a chest x-ray in 6 weeks to confirm clearance of the consolidation on the left lower lobe. She'll continue with her IV vancomycin until 03/25/2012. She will see Dr. Cathren Laine within 4 weeks. She'll also followup with Dr. Lucianne Lei dam.

## 2012-03-03 NOTE — Assessment & Plan Note (Signed)
Her cough is unlikely related to that HCAP, but it is more likely a bronchitis. Physical examination is negative for respiratory distress or abnormal air movement.  Plan. -Will recheck chest x-ray in about 4 weeks, (which will be about 6 weeks since the previous one). This will be for a followup of her left lower lobar infiltrate.

## 2012-03-26 ENCOUNTER — Encounter (HOSPITAL_COMMUNITY): Payer: Self-pay | Admitting: Pharmacy Technician

## 2012-03-29 ENCOUNTER — Ambulatory Visit (HOSPITAL_COMMUNITY): Payer: Medicaid Other

## 2012-03-29 ENCOUNTER — Encounter (HOSPITAL_COMMUNITY): Payer: Self-pay | Admitting: Anesthesiology

## 2012-03-29 ENCOUNTER — Ambulatory Visit (HOSPITAL_COMMUNITY): Payer: Medicaid Other | Admitting: Anesthesiology

## 2012-03-29 ENCOUNTER — Encounter (HOSPITAL_COMMUNITY)
Admission: RE | Disposition: A | Discharge status: Home / Self Care | Payer: Self-pay | Source: Ambulatory Visit | Attending: Vascular Surgery

## 2012-03-29 ENCOUNTER — Ambulatory Visit (HOSPITAL_COMMUNITY)
Admission: RE | Admit: 2012-03-29 | Discharge: 2012-03-29 | Disposition: A | Discharge status: Home / Self Care | Payer: Medicaid Other | Source: Ambulatory Visit | Attending: Vascular Surgery | Admitting: Vascular Surgery

## 2012-03-29 ENCOUNTER — Encounter (HOSPITAL_COMMUNITY): Payer: Self-pay | Admitting: *Deleted

## 2012-03-29 DIAGNOSIS — Z992 Dependence on renal dialysis: Secondary | ICD-10-CM | POA: Insufficient documentation

## 2012-03-29 DIAGNOSIS — R05 Cough: Secondary | ICD-10-CM | POA: Insufficient documentation

## 2012-03-29 DIAGNOSIS — Y832 Surgical operation with anastomosis, bypass or graft as the cause of abnormal reaction of the patient, or of later complication, without mention of misadventure at the time of the procedure: Secondary | ICD-10-CM | POA: Insufficient documentation

## 2012-03-29 DIAGNOSIS — T82898A Other specified complication of vascular prosthetic devices, implants and grafts, initial encounter: Secondary | ICD-10-CM

## 2012-03-29 DIAGNOSIS — B999 Unspecified infectious disease: Secondary | ICD-10-CM | POA: Insufficient documentation

## 2012-03-29 DIAGNOSIS — I120 Hypertensive chronic kidney disease with stage 5 chronic kidney disease or end stage renal disease: Secondary | ICD-10-CM | POA: Insufficient documentation

## 2012-03-29 DIAGNOSIS — Z21 Asymptomatic human immunodeficiency virus [HIV] infection status: Secondary | ICD-10-CM | POA: Insufficient documentation

## 2012-03-29 DIAGNOSIS — N186 End stage renal disease: Secondary | ICD-10-CM | POA: Insufficient documentation

## 2012-03-29 DIAGNOSIS — I742 Embolism and thrombosis of arteries of the upper extremities: Secondary | ICD-10-CM | POA: Insufficient documentation

## 2012-03-29 DIAGNOSIS — T827XXA Infection and inflammatory reaction due to other cardiac and vascular devices, implants and grafts, initial encounter: Secondary | ICD-10-CM | POA: Insufficient documentation

## 2012-03-29 DIAGNOSIS — R059 Cough, unspecified: Secondary | ICD-10-CM | POA: Insufficient documentation

## 2012-03-29 DIAGNOSIS — I87309 Chronic venous hypertension (idiopathic) without complications of unspecified lower extremity: Secondary | ICD-10-CM | POA: Insufficient documentation

## 2012-03-29 HISTORY — PX: INSERTION OF DIALYSIS CATHETER: SHX1324

## 2012-03-29 HISTORY — PX: AVGG REMOVAL: SHX5153

## 2012-03-29 LAB — POCT I-STAT 4, (NA,K, GLUC, HGB,HCT)
Glucose, Bld: 84 mg/dL (ref 70–99)
HCT: 42 % (ref 36.0–46.0)
Hemoglobin: 14.3 g/dL (ref 12.0–15.0)
Potassium: 5.1 mEq/L (ref 3.5–5.1)
Sodium: 136 mEq/L (ref 135–145)

## 2012-03-29 LAB — SURGICAL PCR SCREEN
MRSA, PCR: NEGATIVE
Staphylococcus aureus: NEGATIVE

## 2012-03-29 SURGERY — REMOVAL OF ARTERIOVENOUS GORETEX GRAFT (AVGG)
Anesthesia: General | Site: Arm Upper | Laterality: Right | Wound class: Clean

## 2012-03-29 MED ORDER — MIDAZOLAM HCL 5 MG/5ML IJ SOLN
INTRAMUSCULAR | Status: DC | PRN
  Administered 2012-03-29: 1 mg via INTRAVENOUS

## 2012-03-29 MED ORDER — MUPIROCIN 2 % EX OINT
TOPICAL_OINTMENT | CUTANEOUS | Status: AC
  Filled 2012-03-29: qty 22

## 2012-03-29 MED ORDER — ONDANSETRON HCL 4 MG/2ML IJ SOLN
INTRAMUSCULAR | Status: DC | PRN
  Administered 2012-03-29: 4 mg via INTRAVENOUS

## 2012-03-29 MED ORDER — HEPARIN SODIUM (PORCINE) 1000 UNIT/ML IJ SOLN
INTRAMUSCULAR | Status: DC | PRN
  Administered 2012-03-29: 10 mL

## 2012-03-29 MED ORDER — PROMETHAZINE HCL 25 MG/ML IJ SOLN: 6.2500 mg | INTRAMUSCULAR | Status: DC | PRN

## 2012-03-29 MED ORDER — PROPOFOL 10 MG/ML IV BOLUS
INTRAVENOUS | Status: DC | PRN
  Administered 2012-03-29 (×2): 20 mg via INTRAVENOUS
  Administered 2012-03-29: 100 mg via INTRAVENOUS

## 2012-03-29 MED ORDER — DEXTROSE 5 % IV SOLN
INTRAVENOUS | Status: AC
  Filled 2012-03-29: qty 50

## 2012-03-29 MED ORDER — VANCOMYCIN HCL IN DEXTROSE 1-5 GM/200ML-% IV SOLN
INTRAVENOUS | Status: AC
  Filled 2012-03-29: qty 200

## 2012-03-29 MED ORDER — SODIUM CHLORIDE 0.9 % IV SOLN
INTRAVENOUS | Status: DC | PRN
  Administered 2012-03-29 (×2): via INTRAVENOUS

## 2012-03-29 MED ORDER — SODIUM CHLORIDE 0.9 % IV SOLN
INTRAVENOUS | Status: DC
  Administered 2012-03-29: 09:00:00 via INTRAVENOUS

## 2012-03-29 MED ORDER — HYDROMORPHONE HCL PF 1 MG/ML IJ SOLN
0.2500 mg | INTRAMUSCULAR | Status: DC | PRN
  Administered 2012-03-29: 0.5 mg via INTRAVENOUS

## 2012-03-29 MED ORDER — PROPOFOL INFUSION 10 MG/ML OPTIME
INTRAVENOUS | Status: DC | PRN
  Administered 2012-03-29: 50 ug/kg/min via INTRAVENOUS

## 2012-03-29 MED ORDER — OXYCODONE HCL 5 MG PO TABS: 5.0000 mg | ORAL_TABLET | Freq: Once | ORAL | Status: DC | PRN

## 2012-03-29 MED ORDER — PHENYLEPHRINE HCL 10 MG/ML IJ SOLN
INTRAMUSCULAR | Status: DC | PRN
  Administered 2012-03-29: 80 ug via INTRAVENOUS

## 2012-03-29 MED ORDER — HEPARIN SODIUM (PORCINE) 1000 UNIT/ML IJ SOLN
INTRAMUSCULAR | Status: AC
  Filled 2012-03-29: qty 1

## 2012-03-29 MED ORDER — LIDOCAINE-EPINEPHRINE 0.5 %-1:200000 IJ SOLN
INTRAMUSCULAR | Status: AC
  Filled 2012-03-29: qty 1

## 2012-03-29 MED ORDER — EPHEDRINE SULFATE 50 MG/ML IJ SOLN
INTRAMUSCULAR | Status: DC | PRN
  Administered 2012-03-29: 10 mg via INTRAVENOUS

## 2012-03-29 MED ORDER — HYDROMORPHONE HCL PF 1 MG/ML IJ SOLN
INTRAMUSCULAR | Status: AC
  Filled 2012-03-29: qty 1

## 2012-03-29 MED ORDER — ACETAMINOPHEN 10 MG/ML IV SOLN: 1000.0000 mg | Freq: Once | INTRAVENOUS | Status: DC | PRN

## 2012-03-29 MED ORDER — OXYCODONE HCL 5 MG/5ML PO SOLN: 5.0000 mg | Freq: Once | ORAL | Status: DC | PRN

## 2012-03-29 MED ORDER — SODIUM CHLORIDE 0.9 % IR SOLN
Status: DC | PRN
  Administered 2012-03-29: 09:00:00

## 2012-03-29 MED ORDER — OXYCODONE HCL 5 MG PO TABS: 5.0000 mg | ORAL_TABLET | Freq: Four times a day (QID) | ORAL | Status: DC | PRN

## 2012-03-29 MED ORDER — SODIUM CHLORIDE 0.9 % IV SOLN
1000.0000 mg | INTRAVENOUS | Status: DC | PRN
  Administered 2012-03-29: 1000 mg via INTRAVENOUS

## 2012-03-29 MED ORDER — CEFUROXIME SODIUM 1.5 G IJ SOLR
INTRAMUSCULAR | Status: AC
  Filled 2012-03-29: qty 1.5

## 2012-03-29 MED ORDER — 0.9 % SODIUM CHLORIDE (POUR BTL) OPTIME
TOPICAL | Status: DC | PRN
  Administered 2012-03-29: 1000 mL

## 2012-03-29 MED ORDER — FENTANYL CITRATE 0.05 MG/ML IJ SOLN
INTRAMUSCULAR | Status: DC | PRN
  Administered 2012-03-29 (×5): 50 ug via INTRAVENOUS

## 2012-03-29 MED ORDER — MEPERIDINE HCL 25 MG/ML IJ SOLN: 6.2500 mg | INTRAMUSCULAR | Status: DC | PRN

## 2012-03-29 SURGICAL SUPPLY — 67 items
APL SKNCLS STERI-STRIP NONHPOA (GAUZE/BANDAGES/DRESSINGS) ×2
BAG DECANTER FOR FLEXI CONT (MISCELLANEOUS) ×3 IMPLANT
BANDAGE ELASTIC 4 VELCRO ST LF (GAUZE/BANDAGES/DRESSINGS) ×1 IMPLANT
BANDAGE GAUZE ELAST BULKY 4 IN (GAUZE/BANDAGES/DRESSINGS) ×1 IMPLANT
BENZOIN TINCTURE PRP APPL 2/3 (GAUZE/BANDAGES/DRESSINGS) ×3 IMPLANT
CANISTER SUCTION 2500CC (MISCELLANEOUS) ×3 IMPLANT
CATH CANNON HEMO 15F 50CM (CATHETERS) IMPLANT
CATH CANNON HEMO 15FR 19 (HEMODIALYSIS SUPPLIES) IMPLANT
CATH CANNON HEMO 15FR 23CM (HEMODIALYSIS SUPPLIES) IMPLANT
CATH CANNON HEMO 15FR 31CM (HEMODIALYSIS SUPPLIES) IMPLANT
CATH CANNON HEMO 15FR 32 (HEMODIALYSIS SUPPLIES) IMPLANT
CATH CANNON HEMO 15FR 32CM (HEMODIALYSIS SUPPLIES) ×3 IMPLANT
CLIP LIGATING EXTRA MED SLVR (CLIP) ×3 IMPLANT
CLIP LIGATING EXTRA SM BLUE (MISCELLANEOUS) ×3 IMPLANT
CLOTH BEACON ORANGE TIMEOUT ST (SAFETY) ×3 IMPLANT
COVER PROBE W GEL 5X96 (DRAPES) ×1 IMPLANT
COVER SURGICAL LIGHT HANDLE (MISCELLANEOUS) ×3 IMPLANT
DECANTER SPIKE VIAL GLASS SM (MISCELLANEOUS) ×2 IMPLANT
DRAPE C-ARM 42X72 X-RAY (DRAPES) ×2 IMPLANT
DRAPE CHEST BREAST 15X10 FENES (DRAPES) ×3 IMPLANT
DRAPE INCISE 13X13 STRL (DRAPES) ×1 IMPLANT
ELECT REM PT RETURN 9FT ADLT (ELECTROSURGICAL) ×3
ELECTRODE REM PT RTRN 9FT ADLT (ELECTROSURGICAL) ×2 IMPLANT
GAUZE SPONGE 2X2 8PLY STRL LF (GAUZE/BANDAGES/DRESSINGS) ×2 IMPLANT
GAUZE SPONGE 4X4 16PLY XRAY LF (GAUZE/BANDAGES/DRESSINGS) ×3 IMPLANT
GEL ULTRASOUND 20GR AQUASONIC (MISCELLANEOUS) IMPLANT
GLOVE BIO SURGEON STRL SZ 6.5 (GLOVE) ×2 IMPLANT
GLOVE BIOGEL PI IND STRL 6.5 (GLOVE) IMPLANT
GLOVE BIOGEL PI IND STRL 7.0 (GLOVE) IMPLANT
GLOVE BIOGEL PI INDICATOR 6.5 (GLOVE) ×3
GLOVE BIOGEL PI INDICATOR 7.0 (GLOVE) ×1
GLOVE ECLIPSE 7.5 STRL STRAW (GLOVE) ×1 IMPLANT
GLOVE SS BIOGEL STRL SZ 7.5 (GLOVE) ×2 IMPLANT
GLOVE SUPERSENSE BIOGEL SZ 7.5 (GLOVE) ×2
GLOVE SURG SS PI 7.0 STRL IVOR (GLOVE) ×2 IMPLANT
GOWN STRL NON-REIN LRG LVL3 (GOWN DISPOSABLE) ×8 IMPLANT
GOWN STRL REIN XL XLG (GOWN DISPOSABLE) ×2 IMPLANT
KIT BASIN OR (CUSTOM PROCEDURE TRAY) ×3 IMPLANT
KIT ROOM TURNOVER OR (KITS) ×3 IMPLANT
NDL 18GX1X1/2 (RX/OR ONLY) (NEEDLE) ×2 IMPLANT
NDL HYPO 25GX1X1/2 BEV (NEEDLE) ×2 IMPLANT
NEEDLE 18GX1X1/2 (RX/OR ONLY) (NEEDLE) ×3 IMPLANT
NEEDLE 22X1 1/2 (OR ONLY) (NEEDLE) ×3 IMPLANT
NEEDLE HYPO 25GX1X1/2 BEV (NEEDLE) ×3 IMPLANT
NS IRRIG 1000ML POUR BTL (IV SOLUTION) ×3 IMPLANT
PACK CV ACCESS (CUSTOM PROCEDURE TRAY) ×3 IMPLANT
PACK SURGICAL SETUP 50X90 (CUSTOM PROCEDURE TRAY) ×3 IMPLANT
PAD ARMBOARD 7.5X6 YLW CONV (MISCELLANEOUS) ×6 IMPLANT
SOAP 2 % CHG 4 OZ (WOUND CARE) ×3 IMPLANT
SPONGE GAUZE 2X2 STER 10/PKG (GAUZE/BANDAGES/DRESSINGS) ×1
SPONGE GAUZE 4X4 12PLY (GAUZE/BANDAGES/DRESSINGS) ×4 IMPLANT
STRIP CLOSURE SKIN 1/2X4 (GAUZE/BANDAGES/DRESSINGS) ×3 IMPLANT
SUT ETHILON 3 0 PS 1 (SUTURE) ×6 IMPLANT
SUT PROLENE 5 0 C 1 24 (SUTURE) ×2 IMPLANT
SUT PROLENE 6 0 CC (SUTURE) ×1 IMPLANT
SUT VIC AB 3-0 SH 27 (SUTURE) ×9
SUT VIC AB 3-0 SH 27X BRD (SUTURE) ×4 IMPLANT
SUT VICRYL 4-0 PS2 18IN ABS (SUTURE) ×3 IMPLANT
SYR 20CC LL (SYRINGE) ×3 IMPLANT
SYR 30ML LL (SYRINGE) IMPLANT
SYR 5ML LL (SYRINGE) ×6 IMPLANT
SYR CONTROL 10ML LL (SYRINGE) ×3 IMPLANT
SYRINGE 10CC LL (SYRINGE) ×3 IMPLANT
TOWEL OR 17X24 6PK STRL BLUE (TOWEL DISPOSABLE) ×3 IMPLANT
TOWEL OR 17X26 10 PK STRL BLUE (TOWEL DISPOSABLE) ×4 IMPLANT
UNDERPAD 30X30 INCONTINENT (UNDERPADS AND DIAPERS) ×3 IMPLANT
WATER STERILE IRR 1000ML POUR (IV SOLUTION) ×3 IMPLANT

## 2012-03-29 NOTE — Op Note (Signed)
OPERATIVE REPORT  DATE OF SURGERY: 03/29/2012  PATIENT: [name], 57 y.o. female MRN: [id number]  DOB: [birth year]  PRE-OPERATIVE DIAGNOSIS: End-stage renal disease with potentially infected right upper arm AV graft  POST-OPERATIVE DIAGNOSIS:  Same  PROCEDURE: #1 right IJ dietetic catheter with ultrasound visualization #2 removal of upper arm right AV Gore-Tex graft and patch angioplasty arterial anastomosis  SURGEON:  Curt Jews, M.D.  PHYSICIAN ASSISTANT: Rhyne  ANESTHESIA:  Gen.  EBL: 100 ml  Total I/O In: 500 [I.V.:500] Out: 130 [Blood:130]  BLOOD ADMINISTERED: None  DRAINS: None  SPECIMEN: None  COUNTS CORRECT:  YES  PLAN OF CARE: PACU   PATIENT DISPOSITION:  PACU - hemodynamically stable  PROCEDURE DETAILS: The patient was taken to the operating room placed in supine position where the area the right and left neck were prepped and draped in usual sterile fashion. Using SonoSite ultrasound the right internal jugular was accessed and using an 18-gauge needle and the Seldinger technique guidewire was passed down to the level of the inferior vena cava this was confirmed with fluoroscopy. A dilator and peel-away sheath was passed over the guidewire. A 27 cm dialysis catheter was passed through the peel-away sheath and the peel-away sheath was removed. Catheter was positioned level of the distal right atrium. The catheter was brought through a subcutaneous tunnel through a separate stab incision. The lumen ports were attached in both lumens flushed and aspirated easily and were locked with 1000 unit per cc heparin. The catheter secured to the skin with a 3-0 nylon stitch and the entry site was closed with 4 subcuticular Vicryl stitch.  The patient was then reprepped and draped with the right arm and right axilla. An incision was made over the arterial anastomosis at the antecubital space. The patient has venous hypertension and significant skin edge bleeding. This was  controlled with electrocautery. A separate incision was made in the axilla over the venous anastomosis. This was carried down to the venous anastomosis. There was a side branch of the vein that was harvested for eventual patch angioplasty of the artery. The axillary vein was controlled with a baby Gregory clamp and the graft was removed in its entirety from the axillary vein. The axillary vein anastomotic site was oversewn with a running 5-0 Prolene suture. Next the brachial artery was exposed proximal and distal to the old anastomosis. The artery was occluded proximally and distally to the old arterial anastomosis at the antecubital space with the 2 fistula clamps. The graft was removed in its entirety off the brachial artery. The vein had been harvested from the axilla was brought to the field was spatulated and sewn as a patch angioplasty over the resulting hole in the brachial artery. This was with a running 6-0 Prolene suture. Distal clamps removed and good flow was noted distal to the anastomosis and good hemostasis was encountered. Next 2 separate incisions were made over the graft itself. There was a old nonfunctional portion of the graft was removed in its entirety as was the never interposition loop. After all prosthetic material was removed the wounds were all irrigated with saline and hemostasis was obtained electrocautery. The wounds were closed with 3-0 nylon mattress sutures. Sterile dressing was applied an Ace wrap was applied over this and the patient was taken to the recovery room in stable condition.   Curt Jews, M.D. 03/29/2012 12:03 PM

## 2012-03-29 NOTE — H&P (View-Only) (Signed)
Patient ID: [name], female   DOB: 1955-08-25, 57 y.o.   MRN: [id number]  Subjective:   Patient ID: [name] female   DOB: 1955/04/11 57 y.o.   MRN: [id number]  HPI: [name] is a 57 y.o. history of HIV will, hypertension, end-stage renal disease on dialysis, and the recent bacteremia with MRSA presents for hospital followup visit. She was discharged on 02/17/2011. She is a resident of a [facility].  The patient reports that she is feeling good today. However, she reports some irritating cough that has been ongoing since December 2013. She denies any accompanying chest pain, shortness of breath, mucus production, or sputum. She has no fevers or chills. The cough is  dry, and it's repeatedly happens about a few times a day, and she needs some remedy for it. No history of recent sick contacts. She denies nausea or vomiting. Her appetite is good. She has good bowel movements.  Her chest x-ray during hospitalization 3 weeks ago, raised the question left lower lobe pneumonia. A repeat showed some improvement in aeration of her left lower lobe.  She is following up closely with her dialysis. Her last session was yesterday and she has another one tomorrow. Her dialysis access is not bothersome.  Please see the A&P for the status of the pt's other chronic medical problems.   Past Medical History  Diagnosis Date  . Human immunodeficiency virus (HIV) disease   . Hypertension   . Clostridium difficile infection 04/04/10    07/2011 and 08/2011  . Bacteriuria, asymptomatic 04/04/10    Culture grew VRE sensitive to linesolid   . MGUS (monoclonal gammopathy of unknown significance)   . History of bacteremia     MSSA; 05/2011, 08/2011  . Anemia   . Systolic heart failure     08/27/11 EF 35-40%  . ESRD (end stage renal disease) 03/19/2007    Started HD in 2010.  ESRD was due to HIV per pt. Gets HD on [employer] at [facility] on TTS schedule.     . Cancer    Current  Outpatient Prescriptions  Medication Sig Dispense Refill  . abacavir (ZIAGEN) 300 MG tablet Take 2 tablets (600 mg total) by mouth daily.  60 tablet  11  . Alum & Mag Hydroxide-Simeth (MAGIC MOUTHWASH) SOLN Take by mouth 2 (two) times daily. With lunch and dinner      . aspirin 81 MG tablet Take 81 mg by mouth daily.        [name] b complex vitamins tablet Take 1 tablet by mouth daily.        . calcium acetate (PHOSLO) 667 MG capsule Take 2 capsules (1,334 mg total) by mouth 3 (three) times daily with meals.      . Darunavir Ethanolate (PREZISTA) 800 MG tablet Take 1 tablet (800 mg total) by mouth daily with breakfast.  30 tablet  0  . doxercalciferol (HECTOROL) 4 MCG/2ML injection Inject 1 mL (2 mcg total) into the vein Every Tuesday,Thursday,and Saturday with dialysis.  2 mL    . feeding supplement (ENSURE COMPLETE) LIQD Take 237 mLs by mouth 2 (two) times daily between meals.  60 Bottle  11  . feeding supplement (RESOURCE BREEZE) LIQD Take 1 Container by mouth 3 (three) times daily between meals.      . folic acid (FOLVITE) A999333 MCG tablet Take 800 mcg by mouth daily.      [name] lamiVUDine (EPIVIR) 10 MG/ML solution  Take 5 mLs (50 mg total) by mouth daily.  240 mL    . lidocaine-prilocaine (EMLA) cream Apply 1 application topically 3 (three) times a week. Apply to right arm 30 minutes before leaving for dialysis. Wrap with saran wrap. Dialysis days are Tuesdays, Thursdays, and Saturdays.      . midodrine (PROAMATINE) 10 MG tablet Take 10 mg by mouth 2 (two) times daily at 10 AM and 5 PM.      . multivitamin (RENA-VIT) TABS tablet Take 1 tablet by mouth daily.      . Nutritional Supplements (FEEDING SUPPLEMENT, NEPRO CARB STEADY,) LIQD Take 237 mLs by mouth as needed (missed meal during dialysis.).  30 Can  11  . oxyCODONE (OXY IR/ROXICODONE) 5 MG immediate release tablet Take 5 mg by mouth every 6 (six) hours as needed. For pain      . ritonavir (NORVIR) 100 MG TABS Take 1 tablet (100 mg total) by mouth  daily with breakfast.  30 tablet  0  . sodium chloride 0.9 % SOLN 100 mL with ferric gluconate 12.5 MG/ML SOLN 125 mg Inject 125 mg into the vein Every Tuesday,Thursday,and Saturday with dialysis.      Derrill Memo ON 03/14/2012] sodium chloride 0.9 % SOLN 100 mL with vancomycin 500 MG SOLR 500 mg Inject 500 mg into the vein Every Tuesday,Thursday,and Saturday with dialysis.      [name] guaiFENesin (ROBITUSSIN) 100 MG/5ML SOLN Take 10 mLs (200 mg total) by mouth every 6 (six) hours as needed.  1200 mL  0  . sodium chloride 0.9 % SOLN 150 mL with vancomycin 1000 MG SOLR 750 mg Inject 750 mg into the vein once.       No current facility-administered medications for this visit.   Family History  Problem Relation Age of Onset  . Alcohol abuse      family h/o addiction/alcoholism  . Diabetes Cousin     first degree relatives  . Kidney disease Mother   . Kidney disease Maternal Uncle   . Cancer Sister    History   Social History  . Marital Status: Single    Spouse Name: N/A    Number of Children: N/A  . Years of Education: N/A   Social History Main Topics  . Smoking status: Never Smoker   . Smokeless tobacco: Never Used  . Alcohol Use: No  . Drug Use: Yes    Special: Cocaine     Comment: quit 60yrs ago  . Sexually Active: No   Other Topics Concern  . None   Social History Narrative   Lives with daughter(does not know dx). Oldest daughter does not dx.   Current partner of may years,? fidelity   Review of Systems: Constitutional: Denies fever, chills, diaphoresis, appetite change and fatigue.  HEENT: Denies photophobia, eye pain, redness, hearing loss, ear pain, congestion, sore throat, rhinorrhea, sneezing, mouth sores, trouble swallowing, neck pain, neck stiffness and tinnitus.   Respiratory: Denies SOB, DOE, cough, chest tightness,  and wheezing.   Cardiovascular: Denies chest pain, palpitations and leg swelling.  Gastrointestinal: Denies nausea, vomiting, abdominal pain, diarrhea,  constipation, blood in stool and abdominal distention.  Musculoskeletal: Denies myalgias, back pain, joint swelling, arthralgias and gait problem.  Skin: Denies pallor, rash and wound.  Neurological: Denies dizziness, seizures, syncope, weakness, light-headedness, numbness and headaches.  Hematological: Denies adenopathy. Easy bruising, personal or family bleeding history  Psychiatric/Behavioral: Denies suicidal ideation, mood changes, confusion, nervousness, sleep disturbance and agitation  Objective:  Physical Exam:  Blood pressure 116/68, pulse rate of 84 beats per minute, temperature is 97.67F, oxygen saturation of 98% on room air. Her weight is 148.5 pounds.  Constitutional: Vital signs reviewed.  Patient is a well-developed and well-nourished in no acute distress and cooperative with exam. Alert and oriented x3.  Head: Normocephalic and atraumatic Ear: TM normal bilaterally Mouth: no erythema or exudates, MMM Eyes: PERRL, EOMI, conjunctivae normal, No scleral icterus.  Neck: Supple, Trachea midline normal ROM, No JVD, mass, thyromegaly, or carotid bruit present.  Cardiovascular: RRR, S1 normal, S2 normal, no MRG, pulses symmetric and intact bilaterally Pulmonary/Chest: CTAB, no wheezes, rales, or rhonchi Abdominal: Soft. Non-tender, non-distended, bowel sounds are normal, no masses, organomegaly, or guarding present.  GU: no CVA tenderness A right upper extremity: Vascular HD access site looks clean without any areas of erythema, swelling, warmth or soiling. It is non tender on palpation. Musculoskeletal: No joint deformities, erythema, or stiffness, ROM full and no nontender Hematology: no cervical, inginal, or axillary adenopathy.  Neurological: A&O x3, Strength is normal and symmetric bilaterally, cranial nerve II-XII are grossly intact, no focal motor deficit, sensory intact to light touch bilaterally.  Skin: Warm, dry and intact. No rash, cyanosis, or clubbing.  Psychiatric:  Normal mood and affect. speech and behavior is normal. Judgment and thought content normal. Cognition and memory are normal.   Assessment & Plan:   I have discussed my assessment and plan for the care of Mr. Duman with Dr. Marinda Elk as detailed under my problem based charting.  In brief, she has done well since she was discharged. Her irritating cough, is most likely from a  bronchitis, and I'll give her some Robitussin for symptomatic relief. Will repeat a chest x-ray in 6 weeks to confirm clearance of the consolidation on the left lower lobe. She'll continue with her IV vancomycin until 03/25/2012. She will see Dr. Cathren Laine within 4 weeks. She'll also followup with Dr. Lucianne Lei dam.

## 2012-03-29 NOTE — Preoperative (Signed)
Beta Blockers   Reason not to administer Beta Blockers:Not Applicable 

## 2012-03-29 NOTE — Anesthesia Postprocedure Evaluation (Signed)
Anesthesia Post Note  Patient: [name]  Procedure(s) Performed: Procedure(s) (LRB): REMOVAL OF ARTERIOVENOUS GORETEX GRAFT ([place]) (Right) INSERTION OF DIALYSIS CATHETER (N/A)  Anesthesia type: General  Patient location: PACU  Post pain: Pain level controlled  Post assessment: Post-op Vital signs reviewed  Last Vitals: BP 128/59  Pulse 79  Temp(Src) 35.9 C  Resp 16  Ht 5\' 4"  (1.626 m)  Wt 149 lb 9 oz (67.841 kg)  BMI 25.66 kg/m2  SpO2 93%  Post vital signs: Reviewed  Level of consciousness: sedated  Complications: No apparent anesthesia complications

## 2012-03-29 NOTE — Interval H&P Note (Signed)
History and Physical Interval Note:  03/29/2012 8:50 AM The patient had partial removal of infected right arm graft approximately 6 weeks ago. She presents now with a recurrent infection in the upper portion of the graft away from the prior resected area and also a fluctuant area near her antecubital space. I recommended removal of his complete graft. She also has an old nonfunctional graft in her upper arm that is remote from this. We would leave this wasn't was clearly involved with the current graft infection. She will also have a hemodialysis cath for acute hemodialysis   [name]  has presented today for surgery, with the diagnosis of infected AVG  The various methods of treatment have been discussed with the patient and family. After consideration of risks, benefits and other options for treatment, the patient has consented to  Procedure(s): REMOVAL OF ARTERIOVENOUS GORETEX GRAFT ([place]) (Right) INSERTION OF DIALYSIS CATHETER (N/A) as a surgical intervention .  The patient's history has been reviewed, patient examined, no change in status, stable for surgery.  I have reviewed the patient's chart and labs.  Questions were answered to the patient's satisfaction.     Kyrin Garn

## 2012-03-29 NOTE — OR Nursing (Signed)
Start of diatek insertion at [id number]; end at 43. Start of AV graft removal at 1024; and at 1157.

## 2012-03-29 NOTE — Anesthesia Preprocedure Evaluation (Addendum)
Anesthesia Evaluation  Patient identified by MRN, date of birth, ID band Patient awake    Reviewed: Allergy & Precautions, H&P , NPO status , Patient's Chart, lab work & pertinent test results  History of Anesthesia Complications (+) AWARENESS UNDER ANESTHESIA  Airway Mallampati: II TM Distance: >3 FB Neck ROM: full    Dental  (+) Edentulous Upper, Edentulous Lower and Dental Advisory Given   Pulmonary pneumonia -, resolved,          Cardiovascular Exercise Tolerance: Poor hypertension, Pt. on medications +CHF Rhythm:regular Rate:Normal     Neuro/Psych negative neurological ROS  negative psych ROS   GI/Hepatic negative GI ROS,   Endo/Other  negative endocrine ROS  Renal/GU Dialysis and ESRFRenal disease  negative genitourinary   Musculoskeletal   Abdominal Normal abdominal exam  (+)   Peds  Hematology  (+) Blood dyscrasia, anemia , HIV,   Anesthesia Other Findings   Reproductive/Obstetrics                           Anesthesia Physical  Anesthesia Plan  ASA: III  Anesthesia Plan: General   Post-op Pain Management:    Induction: Intravenous  Airway Management Planned: LMA  Additional Equipment:   Intra-op Plan:   Post-operative Plan: Extubation in OR  Informed Consent: I have reviewed the patients History and Physical, chart, labs and discussed the procedure including the risks, benefits and alternatives for the proposed anesthesia with the patient or authorized representative who has indicated his/her understanding and acceptance.   Dental advisory given  Plan Discussed with: CRNA  Anesthesia Plan Comments:       Anesthesia Quick Evaluation

## 2012-03-29 NOTE — Transfer of Care (Signed)
Immediate Anesthesia Transfer of Care Note  Patient: [name]  Procedure(s) Performed: Procedure(s) with comments: REMOVAL OF ARTERIOVENOUS GORETEX GRAFT ([place]) (Right) INSERTION OF DIALYSIS CATHETER (N/A) - Ultrasound guided  Patient Location: PACU  Anesthesia Type:General  Level of Consciousness: awake, alert , oriented and sedated  Airway & Oxygen Therapy: Patient Spontanous Breathing and Patient connected to face mask oxygen  Post-op Assessment: Report given to PACU RN and Post -op Vital signs reviewed and stable  Post vital signs: Reviewed and stable  Complications: No apparent anesthesia complications

## 2012-03-30 ENCOUNTER — Encounter (HOSPITAL_COMMUNITY): Payer: Self-pay | Admitting: Vascular Surgery

## 2012-03-30 IMAGING — CR DG CHEST 2V
2 series · 2 of 2 positions shown · non-contrast
Comparison: 05/28/2010

CLINICAL DATA: Chest pain, fever

CHEST - 2 VIEW

[x chest ap]
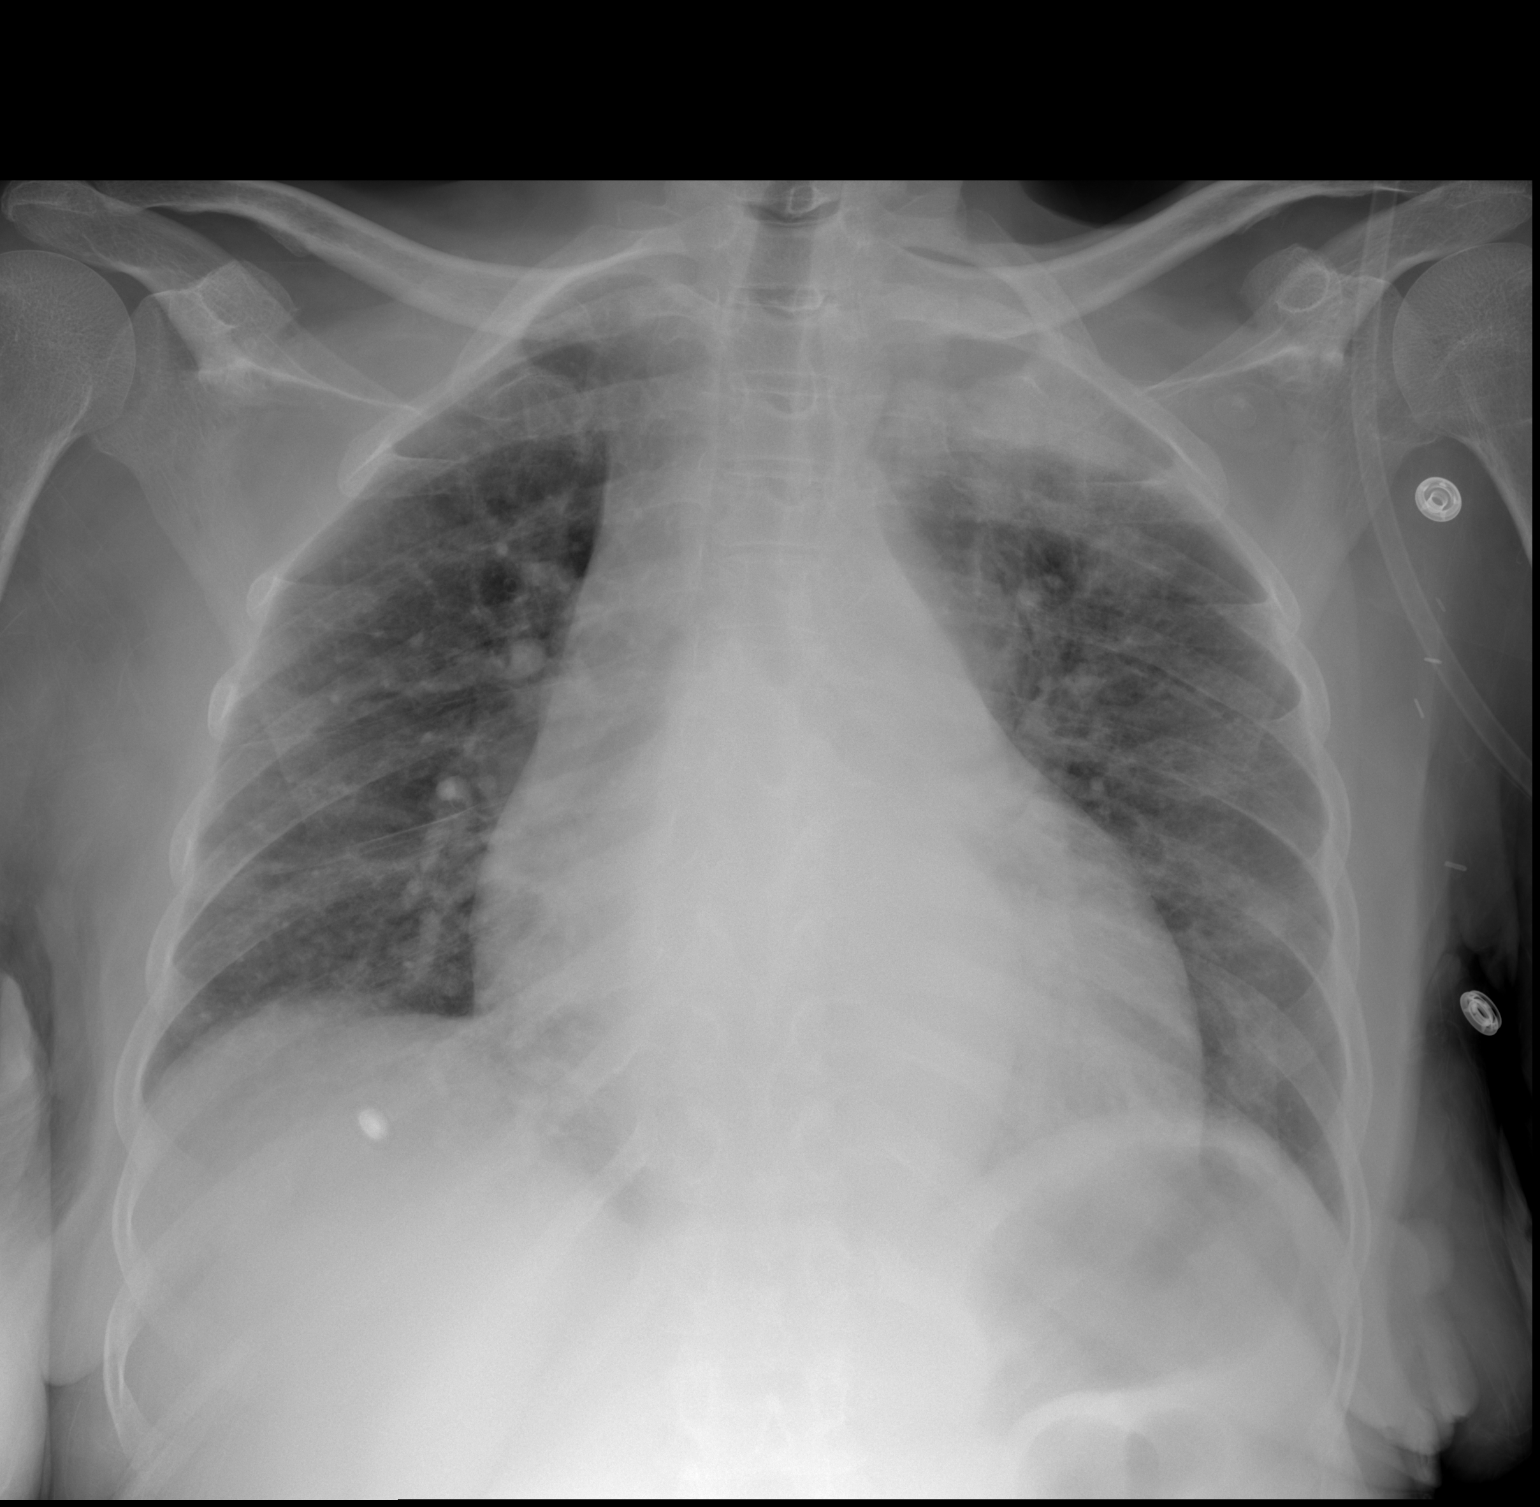

[w chest lat]
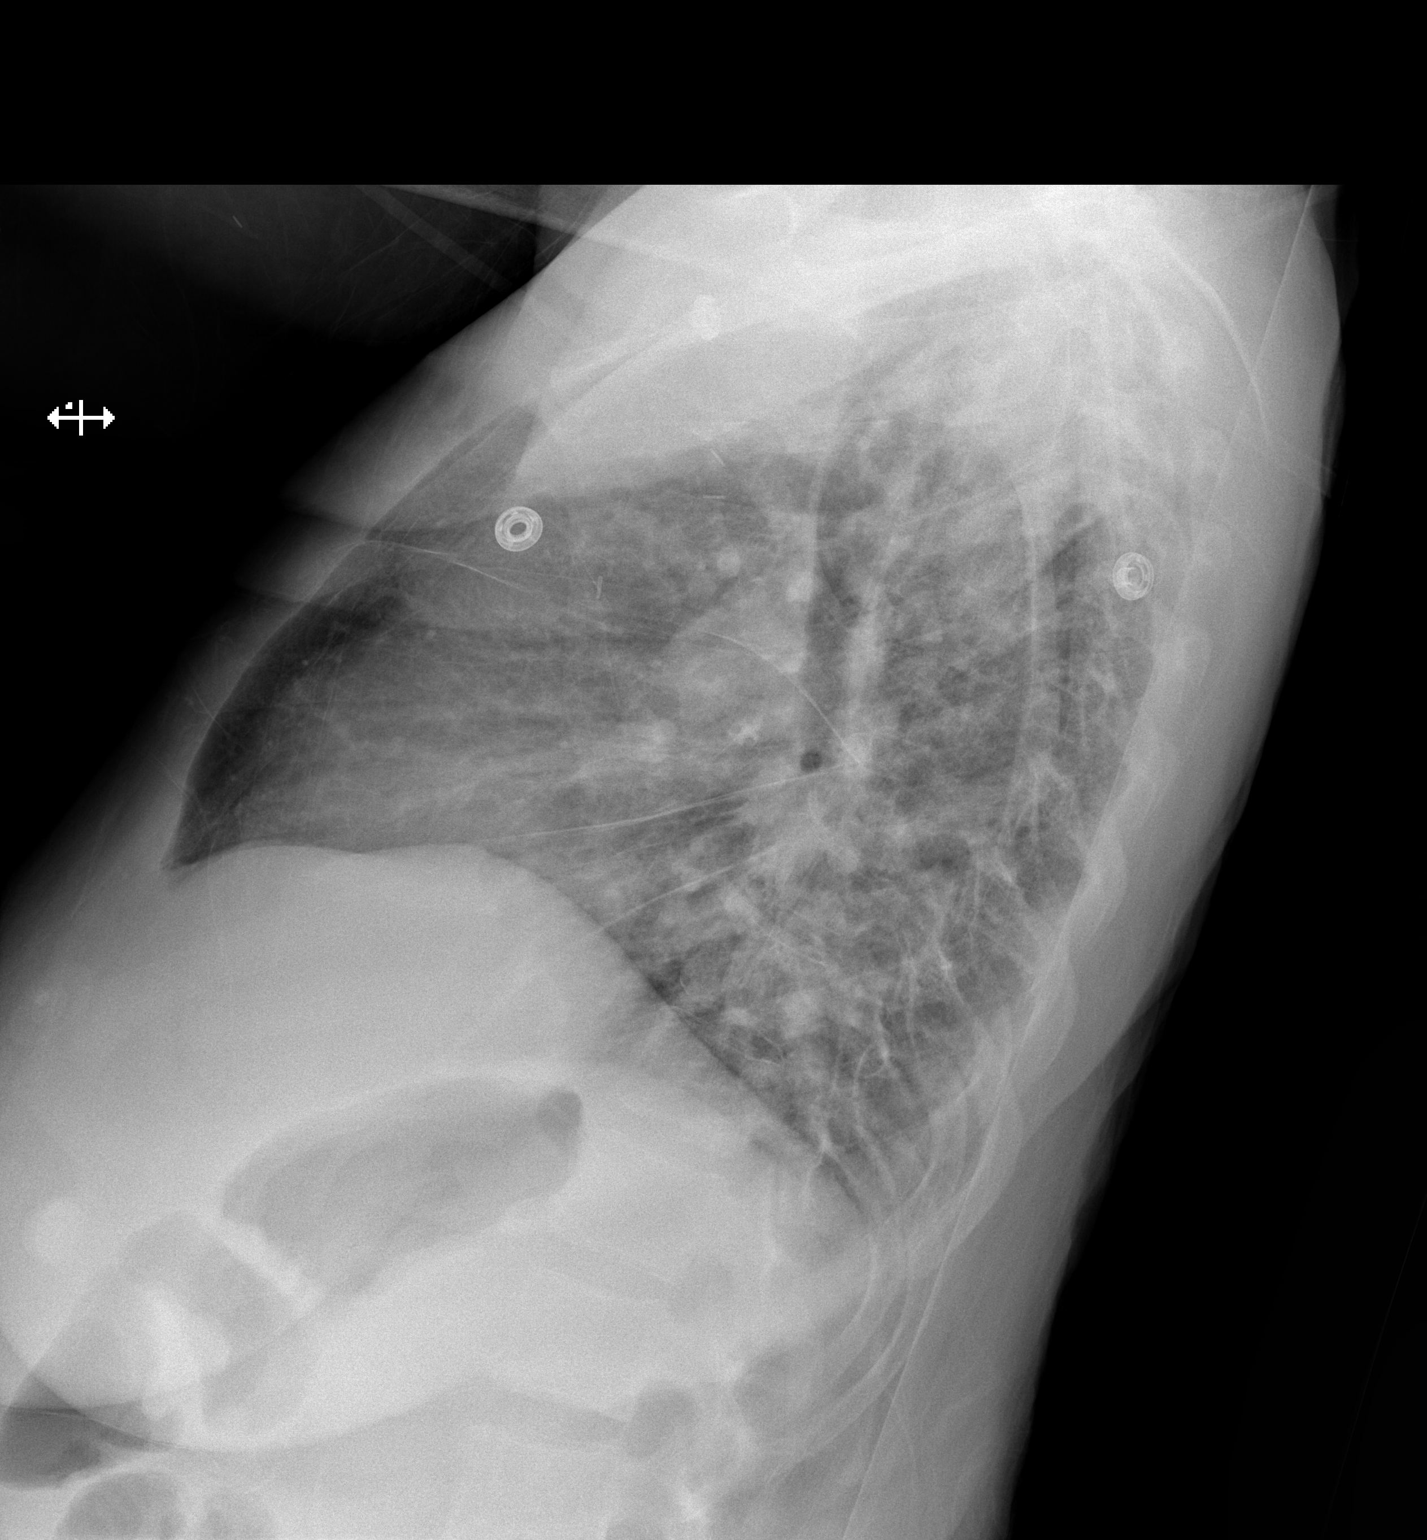

[2 of 2 positions shown; findings below may reference images not displayed]

FINDINGS: Cardiomegaly again noted.  Mild interstitial prominence
bilaterally without convincing pulmonary edema.  There is hazy
airspace disease in the left upper lobe highly suspicious for
evolving infiltrate. Surgical clips are noted than left axilla.
IMPRESSION: Mild interstitial prominence bilaterally without convincing
pulmonary edema.  There is hazy airspace disease in the left upper
lobe highly suspicious for evolving infiltrate. Follow-up to
resolution is recommended.

## 2012-03-30 MED FILL — Mupirocin Oint 2%: CUTANEOUS | Qty: 22 | Status: AC

## 2012-04-02 ENCOUNTER — Telehealth: Payer: Self-pay | Admitting: Vascular Surgery

## 2012-04-02 NOTE — Telephone Encounter (Addendum)
Message copied by Lujean Amel on Fri Apr 02, 2012  4:40 PM ------      Message from: Mena Goes      Created: Fri Apr 02, 2012  3:30 PM      Regarding: schedule                   ----- Message -----         From: Gabriel Earing, PA-C         Sent: 03/29/2012  11:56 AM           To: Mena Goes, CMA            S/p removal RUA AVG and diatek cath placement.  Needs to see Dr. Donnetta Hutching in 2 weeks.  Will have sutures that need to be removed.            Thanks,      Aldona Bar ------  I scheduled an appt for the above pt on 04/13/12 at 8:45am w/ [facility]. I mailed an appt letter and also notified [facility] nurse @ [place] [facility] at ph# [phone]/awt

## 2012-04-05 ENCOUNTER — Encounter: Payer: Self-pay | Admitting: Internal Medicine

## 2012-04-05 ENCOUNTER — Ambulatory Visit (INDEPENDENT_AMBULATORY_CARE_PROVIDER_SITE_OTHER): Payer: Medicaid Other | Admitting: Infectious Disease

## 2012-04-05 ENCOUNTER — Encounter: Payer: Self-pay | Admitting: Infectious Disease

## 2012-04-05 ENCOUNTER — Ambulatory Visit: Payer: Medicaid Other | Admitting: Infectious Disease

## 2012-04-05 ENCOUNTER — Ambulatory Visit (INDEPENDENT_AMBULATORY_CARE_PROVIDER_SITE_OTHER): Payer: Medicaid Other | Admitting: Internal Medicine

## 2012-04-05 VITALS — BP 140/82 | HR 71 | Temp 98.2°F | Wt 161.2 lb

## 2012-04-05 VITALS — BP 167/111 | HR 67 | Temp 98.5°F | Wt 160.0 lb

## 2012-04-05 DIAGNOSIS — N186 End stage renal disease: Secondary | ICD-10-CM

## 2012-04-05 DIAGNOSIS — E46 Unspecified protein-calorie malnutrition: Secondary | ICD-10-CM

## 2012-04-05 DIAGNOSIS — B20 Human immunodeficiency virus [HIV] disease: Secondary | ICD-10-CM

## 2012-04-05 DIAGNOSIS — Z299 Encounter for prophylactic measures, unspecified: Secondary | ICD-10-CM

## 2012-04-05 DIAGNOSIS — Z992 Dependence on renal dialysis: Secondary | ICD-10-CM

## 2012-04-05 DIAGNOSIS — D696 Thrombocytopenia, unspecified: Secondary | ICD-10-CM

## 2012-04-05 DIAGNOSIS — R7881 Bacteremia: Secondary | ICD-10-CM

## 2012-04-05 DIAGNOSIS — Z5189 Encounter for other specified aftercare: Secondary | ICD-10-CM

## 2012-04-05 DIAGNOSIS — A0472 Enterocolitis due to Clostridium difficile, not specified as recurrent: Secondary | ICD-10-CM

## 2012-04-05 DIAGNOSIS — A0471 Enterocolitis due to Clostridium difficile, recurrent: Secondary | ICD-10-CM

## 2012-04-05 DIAGNOSIS — T827XXD Infection and inflammatory reaction due to other cardiac and vascular devices, implants and grafts, subsequent encounter: Secondary | ICD-10-CM

## 2012-04-05 DIAGNOSIS — I38 Endocarditis, valve unspecified: Secondary | ICD-10-CM

## 2012-04-05 DIAGNOSIS — I959 Hypotension, unspecified: Secondary | ICD-10-CM

## 2012-04-05 DIAGNOSIS — A4902 Methicillin resistant Staphylococcus aureus infection, unspecified site: Secondary | ICD-10-CM

## 2012-04-05 DIAGNOSIS — R609 Edema, unspecified: Secondary | ICD-10-CM

## 2012-04-05 LAB — RENAL FUNCTION PANEL
Albumin: 3.4 g/dL — ABNORMAL LOW (ref 3.5–5.2)
BUN: 30 mg/dL — ABNORMAL HIGH (ref 6–23)
CO2: 29 mEq/L (ref 19–32)
Calcium: 9.6 mg/dL (ref 8.4–10.5)
Chloride: 96 mEq/L (ref 96–112)
Creat: 5.81 mg/dL — ABNORMAL HIGH (ref 0.50–1.10)
Glucose, Bld: 70 mg/dL (ref 70–99)
Phosphorus: 3.8 mg/dL (ref 2.3–4.6)
Potassium: 4.6 mEq/L (ref 3.5–5.3)
Sodium: 135 mEq/L (ref 135–145)

## 2012-04-05 LAB — CBC WITH DIFFERENTIAL/PLATELET
Basophils Absolute: 0 10*3/uL (ref 0.0–0.1)
Basophils Relative: 1 % (ref 0–1)
Eosinophils Absolute: 0.3 10*3/uL (ref 0.0–0.7)
Eosinophils Relative: 5 % (ref 0–5)
HCT: 35.1 % — ABNORMAL LOW (ref 36.0–46.0)
Hemoglobin: 11 g/dL — ABNORMAL LOW (ref 12.0–15.0)
Lymphocytes Relative: 52 % — ABNORMAL HIGH (ref 12–46)
Lymphs Abs: 3.6 10*3/uL (ref 0.7–4.0)
MCH: 32.5 pg (ref 26.0–34.0)
MCHC: 31.3 g/dL (ref 30.0–36.0)
MCV: 103.8 fL — ABNORMAL HIGH (ref 78.0–100.0)
Monocytes Absolute: 0.4 10*3/uL (ref 0.1–1.0)
Monocytes Relative: 6 % (ref 3–12)
Neutro Abs: 2.5 10*3/uL (ref 1.7–7.7)
Neutrophils Relative %: 36 % — ABNORMAL LOW (ref 43–77)
Platelets: 249 10*3/uL (ref 150–400)
RBC: 3.38 MIL/uL — ABNORMAL LOW (ref 3.87–5.11)
RDW: 15.6 % — ABNORMAL HIGH (ref 11.5–15.5)
WBC: 6.9 10*3/uL (ref 4.0–10.5)

## 2012-04-05 NOTE — Patient Instructions (Signed)
Follow-up in 2-4 weeks

## 2012-04-05 NOTE — Patient Instructions (Addendum)
I would like to make sure you are still getting vancomycin with HD and that you continue this for 6 weeks after the removal of your infected graft (thru May the 4th)  I wouuld like you to followup with Dr. Tommy Medal (me) in 2 months time

## 2012-04-05 NOTE — Progress Notes (Signed)
Subjective:    Patient ID: [name], female    DOB: Jul 03, 1955, 57 y.o.   MRN: [id number]  HPI   57 year old [name] with HIV currently perfectly controlled and ESRD on HD with yet another episode of MRSA bacteremia in February. She was admitted to my service and underwent exploration and revision of her graft by VVS with concern for infection though no obvious infection was found in the graft. She had TEE which failed to show any evidence of vegetation. He cleared her bacteremia by the 7th of February= date of her graft revisions as well and has continued on IV vancomycin with HD since then but MAY have stopped on the 21st based on my past recommendation.  She is residing in [facility]  As I last saw her she was asked to seen by vascular surgery who did remove her graft which they suspected of being still infected. I also at the same time place a new fresh hemodialysis catheter in the right side. I cannot tell whether she has been continued on antibiotics through the surgery and post surgery. I also cannot see any culture data in Epic.   She is doing well with no fevers, chills or malaise. She has no pain in any joints and no back pain. She denies diarrhea. She is adherent with ARV given in SNF.  I spent greater than 45 minutes with the patient including greater than 50% of time in face to face counsel of the patient and in coordination of their care.    Review of Systems  Constitutional: Negative for fever, chills, diaphoresis, activity change, appetite change, fatigue and unexpected weight change.  HENT: Negative for congestion, sore throat, rhinorrhea, sneezing, trouble swallowing and sinus pressure.   Eyes: Negative for photophobia and visual disturbance.  Respiratory: Negative for cough, chest tightness, shortness of breath, wheezing and stridor.   Cardiovascular: Negative for chest pain, palpitations and leg swelling.  Gastrointestinal: Negative for  nausea, vomiting, abdominal pain, diarrhea, constipation, blood in stool, abdominal distention and anal bleeding.  Genitourinary: Negative for dysuria, hematuria, flank pain and difficulty urinating.  Musculoskeletal: Negative for myalgias, back pain, joint swelling, arthralgias and gait problem.  Skin: Positive for wound. Negative for color change, pallor and rash.  Neurological: Negative for dizziness, tremors, weakness and light-headedness.  Hematological: Negative for adenopathy. Does not bruise/bleed easily.  Psychiatric/Behavioral: Negative for behavioral problems, confusion, sleep disturbance, dysphoric mood, decreased concentration and agitation.       Objective:   Physical Exam  Constitutional: She is oriented to person, place, and time. No distress.  HENT:  Head: Normocephalic and atraumatic.  Mouth/Throat: Oropharynx is clear and moist. No oropharyngeal exudate.  Eyes: Conjunctivae and EOM are normal. Pupils are equal, round, and reactive to light. No scleral icterus.  Neck: Normal range of motion. Neck supple. No JVD present.  Cardiovascular: Normal rate, regular rhythm and normal heart sounds.  Exam reveals no gallop and no friction rub.   No murmur heard. Pulmonary/Chest: Effort normal and breath sounds normal. No respiratory distress. She has no wheezes. She has no rales. She exhibits no tenderness.  Abdominal: She exhibits no distension and no mass. There is no tenderness.  Musculoskeletal: She exhibits no edema and no tenderness.  Lymphadenopathy:    She has no cervical adenopathy.  Neurological: She is alert and oriented to person, place, and time. She has normal reflexes. She exhibits normal muscle tone. Coordination normal.  Skin: Skin is warm. She  is not diaphoretic. No erythema. No pallor.     Psychiatric: She has a normal mood and affect. Her behavior is normal. Judgment and thought content normal.          Assessment & Plan:   MRSA bacteremia with  potentially infected graft: I am trying to ascertain as to whether she may have been on back of minus and prior to removal of her infected graft (which would have been my preference though I did not know she would undergo removal of the graft). I would like her to be on vancomycin IV for 4-6 weeks post graft removal   HIV: perfectly controlled, recheck labs when she comes back for return visit  Hx of C difficile colitis: watch for recurrence

## 2012-04-07 DIAGNOSIS — Z299 Encounter for prophylactic measures, unspecified: Secondary | ICD-10-CM | POA: Insufficient documentation

## 2012-04-07 NOTE — Progress Notes (Signed)
  Subjective:    Patient ID: [name], female    DOB: [birth year], 56 y.o.   MRN: [id number]  HPI Patient is a 57 year old female with past medical history most significant for well-controlled HIV, end-stage renal disease on hemodialysis and recent MRSA infection and bacteremia in February. Patient had revision of her graft by vascular surgery. Patient was seen by vascular surgery again l 2 weeks ago when the graft was suspected to be infected and a new hemodialysis catheter was placed on the right chest. Patient is currently not on any antibiotics. Today as a followup visit with her she does not complain of any specific symptoms. Patient has been receiving her hemodialysis at [place].    Review of Systems  Constitutional: Negative for fever, activity change and appetite change.  HENT: Negative for sore throat.   Respiratory: Negative for cough and shortness of breath.   Cardiovascular: Negative for chest pain and leg swelling.  Gastrointestinal: Negative for nausea, abdominal pain, diarrhea, constipation and abdominal distention.  Genitourinary: Negative for frequency, hematuria and difficulty urinating.  Neurological: Negative for dizziness and headaches.  Psychiatric/Behavioral: Negative for suicidal ideas and behavioral problems.       Objective:   Physical Exam  Constitutional: She is oriented to person, place, and time. She appears well-developed and well-nourished.  HENT:  Head: Normocephalic and atraumatic.  Eyes: Conjunctivae and EOM are normal. Pupils are equal, round, and reactive to light. No scleral icterus.  Neck: Normal range of motion. Neck supple. No JVD present. No thyromegaly present.  Cardiovascular: Normal rate, regular rhythm, normal heart sounds and intact distal pulses.  Exam reveals no gallop and no friction rub.   No murmur heard. Trace pedal edema noted bilaterally  Pulmonary/Chest: Effort normal and breath sounds normal. No respiratory  distress. She has no wheezes. She has no rales.  Patient has hemodialysis catheter in the right upper chest  Abdominal: Soft. Bowel sounds are normal. She exhibits no distension and no mass. There is no tenderness. There is no rebound and no guarding.  Musculoskeletal: Normal range of motion.  Patient has bandage placed on the right arm  Lymphadenopathy:    She has no cervical adenopathy.  Neurological: She is alert and oriented to person, place, and time.  Psychiatric: She has a normal mood and affect. Her behavior is normal.          Assessment & Plan:

## 2012-04-07 NOTE — Assessment & Plan Note (Signed)
This is chronic and most likely secondary to patient's HIV medications.  It has been stable for several months.  I will repeat CBC today.

## 2012-04-07 NOTE — Assessment & Plan Note (Signed)
Currently normotensive.

## 2012-04-07 NOTE — Assessment & Plan Note (Signed)
Patient's HIV status has been well maintained ever since starting salvage regimen. Dr. Drucilla Schmidt to followup in the afternoon.

## 2012-04-07 NOTE — Assessment & Plan Note (Signed)
Patient is compliant with her dialysis.

## 2012-04-07 NOTE — Assessment & Plan Note (Signed)
This is something which is ongoing. Patient was counseled to improve her nutritional status.

## 2012-04-07 NOTE — Assessment & Plan Note (Signed)
Tetanus shot today. Mammogram and colonoscopy to be scheduled today.

## 2012-04-07 NOTE — Assessment & Plan Note (Signed)
Doing much better at this time. I would not change any medications today.

## 2012-04-07 NOTE — Assessment & Plan Note (Signed)
This was treated with prolonged IV antibiotic. Patient does not have any signs and symptoms of heart failure at this time.

## 2012-04-12 ENCOUNTER — Non-Acute Institutional Stay (SKILLED_NURSING_FACILITY): Payer: Medicaid Other | Admitting: Internal Medicine

## 2012-04-12 ENCOUNTER — Encounter: Payer: Self-pay | Admitting: Vascular Surgery

## 2012-04-12 DIAGNOSIS — N039 Chronic nephritic syndrome with unspecified morphologic changes: Secondary | ICD-10-CM

## 2012-04-12 DIAGNOSIS — D631 Anemia in chronic kidney disease: Secondary | ICD-10-CM

## 2012-04-12 DIAGNOSIS — I951 Orthostatic hypotension: Secondary | ICD-10-CM

## 2012-04-12 DIAGNOSIS — B20 Human immunodeficiency virus [HIV] disease: Secondary | ICD-10-CM

## 2012-04-12 DIAGNOSIS — N186 End stage renal disease: Secondary | ICD-10-CM

## 2012-04-13 ENCOUNTER — Ambulatory Visit: Payer: Medicaid Other | Admitting: Vascular Surgery

## 2012-04-14 ENCOUNTER — Ambulatory Visit: Payer: Medicaid Other | Admitting: Infectious Disease

## 2012-04-14 ENCOUNTER — Encounter: Payer: Self-pay | Admitting: *Deleted

## 2012-04-14 NOTE — Progress Notes (Signed)
PROGRESS NOTE  DATE: 04-12-12  FACILITY: [facility]  LEVEL OF CARE: SNF  Routine Visit  CHIEF COMPLAINT:  Manage anemia of chronic kidney disease and orthostatic hypotension  HISTORY OF PRESENT ILLNESS:  REASSESSMENT OF ONGOING PROBLEM(S): 1. ANEMIA: The anemia has been stable. The patient denies fatigue, melena or hematochezia. No complications from the medications currently being used. Anemia secondary to chronic kidney disease. In 2/14 hemoglobin 10.5, MCV 108. 2.ORTHOSTATIC HYPOTENSION: The orthostatcic hypotension remains stable.  Pt is tolerating current medications without any adverse reactions.  No dizziness or lightheadedness reported.  PAST MEDICAL HISTORY : Reviewed.  No changes.  CURRENT MEDICATIONS: Reviewed per [facility]  REVIEW OF SYSTEMS:  GENERAL: no change in appetite, no fatigue, no weight changes, no fever, chills or weakness RESPIRATORY: no cough, SOB, DOE, wheezing, hemoptysis CARDIAC: no chest pain, or palpitations, complains of bilateral lower extremity swelling GI: no abdominal pain, diarrhea, constipation, heart burn, nausea or vomiting  PHYSICAL EXAMINATION  VS:  T 98.2       P 70      RR 18      BP 110/80     POX %     WT (Lb) 155  GENERAL: no acute distress, normal body habitus NECK: supple, trachea midline, no neck masses, no thyroid tenderness, no thyromegaly RESPIRATORY: breathing is even & unlabored, BS CTAB CARDIAC: RRR, no murmur,no extra heart sounds, left lower extremity has +3 edema and right lower extremity has +2 edema GI: abdomen soft, normal BS, no masses, no tenderness, no hepatomegaly, no splenomegaly PSYCHIATRIC: the patient is alert & oriented to person, affect & behavior appropriate  LABS/RADIOLOGY: 2/14 hemoglobin 10.5, MCV 108 otherwise CBC normal, BUN 25, creatinine 5.3, total protein 8.9 otherwise CMP normal, CD4 330, RPR nonreactive   ASSESSMENT/PLAN: 1. anemia of chronic kidney disease-stable. 2. orthostatic  hypotension-continue midodrine. 3. AIDS-continue current medications. Managed by infectious disease. 4. end-stage renal disease-on hemodialysis. 5. hyperphosphatemia-on PhosLo.  CPT CODE: 63016

## 2012-04-19 ENCOUNTER — Encounter: Payer: Self-pay | Admitting: Vascular Surgery

## 2012-04-20 ENCOUNTER — Ambulatory Visit (INDEPENDENT_AMBULATORY_CARE_PROVIDER_SITE_OTHER): Payer: Medicaid Other | Admitting: Vascular Surgery

## 2012-04-20 ENCOUNTER — Encounter: Payer: Self-pay | Admitting: Vascular Surgery

## 2012-04-20 VITALS — BP 112/70 | HR 71 | Resp 16 | Ht 65.0 in | Wt 150.5 lb

## 2012-04-20 DIAGNOSIS — N186 End stage renal disease: Secondary | ICD-10-CM

## 2012-04-20 NOTE — Progress Notes (Signed)
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 She's had an extensive past history of access. She had an infected below left upper arm AV graft removed a number of years ago and had difficult time with this actually requiring a axillary to brachial bypass with saphenous vein for critical ischemia of her left arm. She clearly is not option for left arm access. She does have palpable left popliteal pulse. She has marked edema and is having an additional dialysis treatments for this. I feel there next best option for access would be a left femoral loop graft. I discussed this at length with the patient who understands. We will schedule this on April 28 with an overnight admission and discharge for regular dialysis on the 29th.

## 2012-04-22 ENCOUNTER — Other Ambulatory Visit (HOSPITAL_COMMUNITY): Payer: Self-pay | Admitting: Obstetrics & Gynecology

## 2012-04-22 DIAGNOSIS — Z1231 Encounter for screening mammogram for malignant neoplasm of breast: Secondary | ICD-10-CM

## 2012-04-26 ENCOUNTER — Ambulatory Visit (INDEPENDENT_AMBULATORY_CARE_PROVIDER_SITE_OTHER): Payer: Medicaid Other | Admitting: Internal Medicine

## 2012-04-26 VITALS — BP 144/88 | HR 64 | Temp 97.3°F | Wt 167.5 lb

## 2012-04-26 DIAGNOSIS — R609 Edema, unspecified: Secondary | ICD-10-CM

## 2012-04-26 DIAGNOSIS — I872 Venous insufficiency (chronic) (peripheral): Secondary | ICD-10-CM

## 2012-04-26 DIAGNOSIS — B20 Human immunodeficiency virus [HIV] disease: Secondary | ICD-10-CM

## 2012-04-26 DIAGNOSIS — D638 Anemia in other chronic diseases classified elsewhere: Secondary | ICD-10-CM

## 2012-04-26 NOTE — Patient Instructions (Addendum)
Compression stocking during the day and keep your feet elevated while lying down. Venous Stasis and Chronic Venous Insufficiency As people age, the veins located in their legs may weaken and stretch. When veins weaken and lose the ability to pump blood effectively, the condition is called chronic venous insufficiency (CVI) or venous stasis. Almost all veins return blood back to the heart. This happens by:  The force of the heart pumping fresh blood pushes blood back to the heart.  Blood flowing to the heart from the force of gravity. In the deep veins of the legs, blood has to fight gravity and flow upstream back to the heart. Here, the leg muscles contract to pump blood back toward the heart. Vein walls are elastic, and many veins have small valves that only allow blood to flow in one direction. When leg muscles contract, they push inward against the elastic vein walls. This squeezes blood upward, opens the valves, and moves blood toward the heart. When leg muscles relax, the vein wall also relaxes and the valves inside the vein close to prevent blood from flowing backward. This method of pumping blood out of the legs is called the venous pump. CAUSES  The venous pump works best while walking and leg muscles are contracting. But when a person sits or stands, blood pressure in leg veins can build. Deep veins are usually able to withstand short periods of inactivity, but long periods of inactivity (and increased pressure) can stretch, weaken, and damage vein walls. High blood pressure can also stretch and damage vein walls. The veins may no longer be able to pump blood back to the heart. Venous hypertension (high blood pressure inside veins) that lasts over time is a primary cause of CVI. CVI can also be caused by:   Deep vein thrombosis, a condition where a thrombus (blood clot) blocks blood flow in a vein.  Phlebitis, an inflammation of a superficial vein that causes a blood clot to form. Other risk  factors for CVI may include:   Heredity.  Obesity.  Pregnancy.  Sedentary lifestyle.  Smoking.  Jobs requiring long periods of standing or sitting in one place.  Age and gender:  Women in their 78's and 42's and men in their 67's are more prone to developing CVI. SYMPTOMS  Symptoms of CVI may include:   Varicose veins.  Ulceration or skin breakdown.  Lipodermatosclerosis, a condition that affects the skin just above the ankle, usually on the inside surface. Over time the skin becomes brown, smooth, tight and often painful. Those with this condition have a high risk of developing skin ulcers.  Reddened or discolored skin on the leg.  Swelling. DIAGNOSIS  Your caregiver can diagnose CVI after performing a careful medical history and physical examination. To confirm the diagnosis, the following tests may also be ordered:   Duplex ultrasound.  Plethysmography (tests blood flow).  Venograms (x-ray using a special dye). TREATMENT The goals of treatment for CVI are to restore a person to an active life and to minimize pain or disability. Typically, CVI does not pose a serious threat to life or limb, and with proper treatment most people with this condition can continue to lead active lives. In most cases, mild CVI can be treated on an outpatient basis with simple procedures. Treatment methods include:   Elastic compression socks.  Sclerotherapy, a procedure involving an injection of a material that "dissolves" the damaged veins. Other veins in the network of blood vessels take over the function of  the damaged veins.  Vein stripping (an older procedure less commonly used).  Laser Ablation surgery.  Valve repair. HOME CARE INSTRUCTIONS   Elastic compression socks must be worn every day. They can help with symptoms and lower the chances of the problem getting worse, but they do not cure the problem.  Only take over-the-counter or prescription medicines for pain,  discomfort, or fever as directed by your caregiver.  Your caregiver will review your other medications with you. SEEK MEDICAL CARE IF:   You are confused about how to take your medications.  There is redness, swelling, or increasing pain in the affected area.  There is a red streak or line that extends up or down from the affected area.  There is a breakdown or loss of skin in the affected area, even if the breakdown is small.  You develop an unexplained oral temperature above 102 F (38.9 C).  There is an injury to the affected area. SEEK IMMEDIATE MEDICAL CARE IF:   There is an injury and open wound to the affected area.  Pain is not adequately relieved with pain medication prescribed or becomes severe.  An oral temperature above 102 F (38.9 C) develops.  The foot/ankle below the affected area becomes suddenly numb or the area feels weak and hard to move. MAKE SURE YOU:   Understand these instructions.  Will watch your condition.  Will get help right away if you are not doing well or get worse. Document Released: 04/28/2006 Document Revised: 03/17/2011 Document Reviewed: 07/06/2006 [facility] Patient Information 2013 [place].

## 2012-04-26 NOTE — Progress Notes (Signed)
  Subjective:    Patient ID: [name], female    DOB: 08-Jul-1955, 57 y.o.   MRN: [id number]  HPI  Patient is complaining of increased lower extremity edema. She has always had it and is not associated with pain, redness or tenderness.  No other complaints at this time.  Patient is getting her regular dialysis through HD cath port on right anterior chest. She is about to get discharged from [facility] and told me that she had the means and medications when she goes home.    Review of Systems  Constitutional: Negative for fever, activity change and appetite change.  HENT: Negative for sore throat.   Respiratory: Negative for cough and shortness of breath.   Cardiovascular: Negative for chest pain and leg swelling.  Gastrointestinal: Negative for nausea, abdominal pain, diarrhea, constipation and abdominal distention.  Genitourinary: Negative for frequency, hematuria and difficulty urinating.  Neurological: Negative for dizziness and headaches.  Psychiatric/Behavioral: Negative for suicidal ideas and behavioral problems.       Objective:   Physical Exam  Constitutional: She is oriented to person, place, and time. She appears well-developed and well-nourished.  HENT:  Head: Normocephalic and atraumatic.  Eyes: Conjunctivae and EOM are normal. Pupils are equal, round, and reactive to light. No scleral icterus.  Neck: Normal range of motion. Neck supple. No JVD present. No thyromegaly present.  Cardiovascular: Normal rate, regular rhythm, normal heart sounds and intact distal pulses.  Exam reveals no gallop and no friction rub.   No murmur heard. Trace pedal edema noted bilaterally  Pulmonary/Chest: Effort normal and breath sounds normal. No respiratory distress. She has no wheezes. She has no rales.  Patient has hemodialysis catheter in the right upper chest  Abdominal: Soft. Bowel sounds are normal. She exhibits no distension and no mass. There is no tenderness. There is no  rebound and no guarding.  Musculoskeletal: Normal range of motion.  Lymphadenopathy:    She has no cervical adenopathy.  Neurological: She is alert and oriented to person, place, and time.  Psychiatric: She has a normal mood and affect. Her behavior is normal.          Assessment & Plan:

## 2012-04-27 ENCOUNTER — Other Ambulatory Visit: Payer: Self-pay

## 2012-04-28 ENCOUNTER — Ambulatory Visit (HOSPITAL_COMMUNITY)
Admission: RE | Admit: 2012-04-28 | Discharge: 2012-04-28 | Disposition: A | Discharge status: Home / Self Care | Payer: Medicaid Other | Source: Ambulatory Visit | Attending: Obstetrics & Gynecology | Admitting: Obstetrics & Gynecology

## 2012-04-28 DIAGNOSIS — Z1231 Encounter for screening mammogram for malignant neoplasm of breast: Secondary | ICD-10-CM | POA: Insufficient documentation

## 2012-04-30 ENCOUNTER — Encounter (HOSPITAL_COMMUNITY): Payer: Self-pay

## 2012-04-30 ENCOUNTER — Encounter (HOSPITAL_COMMUNITY): Payer: Self-pay | Admitting: *Deleted

## 2012-04-30 NOTE — Assessment & Plan Note (Signed)
Taking her medications as prescribed.

## 2012-04-30 NOTE — Progress Notes (Signed)
Spoke with Tyson Alias (nurse) at [facility] to make aware of medications that patient should take the day of surgery. Tyson Alias informed me that Hoyle Sauer (nurse at Dr. Luther Parody office ) gave instructions for the patient to take NO medications the morning of surgery.

## 2012-04-30 NOTE — Progress Notes (Signed)
Ebony Hail reviewed EKG and chest x-ray; anesthesia to review day of surgery.

## 2012-04-30 NOTE — Assessment & Plan Note (Signed)
Patient advised to keep her legs elevated at all times when she is lying or sitting to improve blood flow. The swelling is most likely secondary to chronic venous insuffiencey and does not need to be treated aggressively in the setting of multiple medical problems. For symptomatic relief, she was advised to wear compression stockings.

## 2012-04-30 NOTE — Assessment & Plan Note (Signed)
At baseline. No need to repeat blood tests today.

## 2012-05-02 MED ORDER — VANCOMYCIN HCL IN DEXTROSE 1-5 GM/200ML-% IV SOLN
1000.0000 mg | INTRAVENOUS | Status: AC
  Administered 2012-05-03: 1000 mg via INTRAVENOUS
  Filled 2012-05-02: qty 200

## 2012-05-03 ENCOUNTER — Ambulatory Visit (HOSPITAL_COMMUNITY)
Admission: RE | Admit: 2012-05-03 | Discharge: 2012-05-04 | Disposition: A | Discharge status: Skilled Nursing Facility | Payer: Medicaid Other | Source: Ambulatory Visit | Attending: Vascular Surgery | Admitting: Vascular Surgery

## 2012-05-03 ENCOUNTER — Encounter (HOSPITAL_COMMUNITY): Payer: Self-pay | Admitting: Anesthesiology

## 2012-05-03 ENCOUNTER — Encounter (HOSPITAL_COMMUNITY)
Admission: RE | Disposition: A | Discharge status: Skilled Nursing Facility | Payer: Self-pay | Source: Ambulatory Visit | Attending: Vascular Surgery

## 2012-05-03 ENCOUNTER — Ambulatory Visit (HOSPITAL_COMMUNITY): Payer: Medicaid Other | Admitting: Anesthesiology

## 2012-05-03 ENCOUNTER — Encounter (HOSPITAL_COMMUNITY): Payer: Self-pay | Admitting: *Deleted

## 2012-05-03 DIAGNOSIS — N186 End stage renal disease: Secondary | ICD-10-CM | POA: Insufficient documentation

## 2012-05-03 DIAGNOSIS — I120 Hypertensive chronic kidney disease with stage 5 chronic kidney disease or end stage renal disease: Secondary | ICD-10-CM | POA: Insufficient documentation

## 2012-05-03 DIAGNOSIS — Z21 Asymptomatic human immunodeficiency virus [HIV] infection status: Secondary | ICD-10-CM | POA: Insufficient documentation

## 2012-05-03 DIAGNOSIS — Z992 Dependence on renal dialysis: Secondary | ICD-10-CM | POA: Insufficient documentation

## 2012-05-03 HISTORY — DX: Peripheral vascular disease, unspecified: I73.9

## 2012-05-03 HISTORY — PX: AV FISTULA PLACEMENT: SHX1204

## 2012-05-03 LAB — COMPREHENSIVE METABOLIC PANEL
ALT: 9 U/L (ref 0–35)
AST: 25 U/L (ref 0–37)
Albumin: 2.9 g/dL — ABNORMAL LOW (ref 3.5–5.2)
Alkaline Phosphatase: 236 U/L — ABNORMAL HIGH (ref 39–117)
BUN: 51 mg/dL — ABNORMAL HIGH (ref 6–23)
CO2: 25 mEq/L (ref 19–32)
Calcium: 9.3 mg/dL (ref 8.4–10.5)
Chloride: 96 mEq/L (ref 96–112)
Creatinine, Ser: 7.29 mg/dL — ABNORMAL HIGH (ref 0.50–1.10)
GFR calc Af Amer: 7 mL/min — ABNORMAL LOW (ref >90)
GFR calc non Af Amer: 6 mL/min — ABNORMAL LOW (ref >90)
Glucose, Bld: 90 mg/dL (ref 70–99)
Potassium: 4.7 mEq/L (ref 3.5–5.1)
Sodium: 133 mEq/L — ABNORMAL LOW (ref 135–145)
Total Bilirubin: 0.3 mg/dL (ref 0.3–1.2)
Total Protein: 8.7 g/dL — ABNORMAL HIGH (ref 6.0–8.3)

## 2012-05-03 LAB — POCT I-STAT 4, (NA,K, GLUC, HGB,HCT)
Glucose, Bld: 78 mg/dL (ref 70–99)
HCT: 45 % (ref 36.0–46.0)
Hemoglobin: 15.3 g/dL — ABNORMAL HIGH (ref 12.0–15.0)
Potassium: 3.9 mEq/L (ref 3.5–5.1)
Sodium: 137 mEq/L (ref 135–145)

## 2012-05-03 LAB — CBC
HCT: 39 % (ref 36.0–46.0)
Hemoglobin: 12.8 g/dL (ref 12.0–15.0)
MCH: 34 pg (ref 26.0–34.0)
MCHC: 32.8 g/dL (ref 30.0–36.0)
MCV: 103.7 fL — ABNORMAL HIGH (ref 78.0–100.0)
Platelets: 150 10*3/uL (ref 150–400)
RBC: 3.76 MIL/uL — ABNORMAL LOW (ref 3.87–5.11)
RDW: 15.8 % — ABNORMAL HIGH (ref 11.5–15.5)
WBC: 7 10*3/uL (ref 4.0–10.5)

## 2012-05-03 LAB — SURGICAL PCR SCREEN
MRSA, PCR: NEGATIVE
Staphylococcus aureus: NEGATIVE

## 2012-05-03 SURGERY — INSERTION OF ARTERIOVENOUS (AV) GORE-TEX GRAFT THIGH
Anesthesia: General | Site: Leg Upper | Laterality: Left | Wound class: Clean

## 2012-05-03 MED ORDER — SODIUM CHLORIDE 0.9 % IJ SOLN: 3.0000 mL | INTRAMUSCULAR | Status: DC | PRN

## 2012-05-03 MED ORDER — PROPOFOL 10 MG/ML IV BOLUS
INTRAVENOUS | Status: DC | PRN
  Administered 2012-05-03: 120 mg via INTRAVENOUS

## 2012-05-03 MED ORDER — MUPIROCIN 2 % EX OINT
TOPICAL_OINTMENT | Freq: Two times a day (BID) | CUTANEOUS | Status: DC
  Administered 2012-05-03: 1 via NASAL
  Administered 2012-05-03 – 2012-05-04 (×2): via NASAL
  Filled 2012-05-03 (×2): qty 22

## 2012-05-03 MED ORDER — ONDANSETRON HCL 4 MG/2ML IJ SOLN: 4.0000 mg | Freq: Four times a day (QID) | INTRAMUSCULAR | Status: DC | PRN

## 2012-05-03 MED ORDER — GUAIFENESIN 100 MG/5ML PO SOLN
10.0000 mL | Freq: Four times a day (QID) | ORAL | Status: DC | PRN
  Filled 2012-05-03: qty 10

## 2012-05-03 MED ORDER — MEPERIDINE HCL 25 MG/ML IJ SOLN: 6.2500 mg | INTRAMUSCULAR | Status: DC | PRN

## 2012-05-03 MED ORDER — RITONAVIR 100 MG PO TABS
100.0000 mg | ORAL_TABLET | Freq: Every day | ORAL | Status: DC
  Administered 2012-05-04: 100 mg via ORAL
  Filled 2012-05-03 (×2): qty 1

## 2012-05-03 MED ORDER — 0.9 % SODIUM CHLORIDE (POUR BTL) OPTIME
TOPICAL | Status: DC | PRN
  Administered 2012-05-03: 1000 mL

## 2012-05-03 MED ORDER — SODIUM CHLORIDE 0.9 % IJ SOLN
3.0000 mL | Freq: Two times a day (BID) | INTRAMUSCULAR | Status: DC
  Administered 2012-05-03 – 2012-05-04 (×2): 3 mL via INTRAVENOUS

## 2012-05-03 MED ORDER — ABACAVIR SULFATE 300 MG PO TABS
600.0000 mg | ORAL_TABLET | Freq: Every day | ORAL | Status: DC
  Administered 2012-05-03 – 2012-05-04 (×2): 600 mg via ORAL
  Filled 2012-05-03 (×2): qty 2

## 2012-05-03 MED ORDER — MIDAZOLAM HCL 2 MG/2ML IJ SOLN: 0.5000 mg | Freq: Once | INTRAMUSCULAR | Status: DC | PRN

## 2012-05-03 MED ORDER — GUAIFENESIN-DM 100-10 MG/5ML PO SYRP: 15.0000 mL | ORAL_SOLUTION | ORAL | Status: DC | PRN

## 2012-05-03 MED ORDER — SODIUM CHLORIDE 0.9 % IV SOLN
INTRAVENOUS | Status: DC
  Administered 2012-05-03: 10:00:00 via INTRAVENOUS

## 2012-05-03 MED ORDER — ONDANSETRON HCL 4 MG/2ML IJ SOLN
INTRAMUSCULAR | Status: DC | PRN
  Administered 2012-05-03: 4 mg via INTRAVENOUS

## 2012-05-03 MED ORDER — FENTANYL CITRATE 0.05 MG/ML IJ SOLN
INTRAMUSCULAR | Status: DC | PRN
  Administered 2012-05-03 (×2): 50 ug via INTRAVENOUS

## 2012-05-03 MED ORDER — LIDOCAINE HCL (CARDIAC) 20 MG/ML IV SOLN
INTRAVENOUS | Status: DC | PRN
  Administered 2012-05-03: 20 mg via INTRAVENOUS

## 2012-05-03 MED ORDER — POTASSIUM CHLORIDE CRYS ER 20 MEQ PO TBCR: 20.0000 meq | EXTENDED_RELEASE_TABLET | Freq: Once | ORAL | Status: AC | PRN

## 2012-05-03 MED ORDER — OXYCODONE HCL 5 MG PO TABS
5.0000 mg | ORAL_TABLET | Freq: Four times a day (QID) | ORAL | Status: DC | PRN
  Administered 2012-05-03: 5 mg via ORAL
  Filled 2012-05-03: qty 1

## 2012-05-03 MED ORDER — MIDAZOLAM HCL 5 MG/5ML IJ SOLN
INTRAMUSCULAR | Status: DC | PRN
  Administered 2012-05-03: 2 mg via INTRAVENOUS

## 2012-05-03 MED ORDER — FENTANYL CITRATE 0.05 MG/ML IJ SOLN: 25.0000 ug | INTRAMUSCULAR | Status: DC | PRN

## 2012-05-03 MED ORDER — PANTOPRAZOLE SODIUM 40 MG PO TBEC
40.0000 mg | DELAYED_RELEASE_TABLET | Freq: Every day | ORAL | Status: DC
  Administered 2012-05-03 – 2012-05-04 (×2): 40 mg via ORAL
  Filled 2012-05-03 (×2): qty 1

## 2012-05-03 MED ORDER — B COMPLEX-C PO TABS
1.0000 | ORAL_TABLET | Freq: Every day | ORAL | Status: DC
  Administered 2012-05-03: 1 via ORAL
  Filled 2012-05-03 (×2): qty 1

## 2012-05-03 MED ORDER — MIDODRINE HCL 5 MG PO TABS
10.0000 mg | ORAL_TABLET | Freq: Two times a day (BID) | ORAL | Status: DC
  Administered 2012-05-03 – 2012-05-04 (×3): 10 mg via ORAL
  Filled 2012-05-03 (×3): qty 2

## 2012-05-03 MED ORDER — OXYCODONE HCL 5 MG PO TABS: 5.0000 mg | ORAL_TABLET | Freq: Four times a day (QID) | ORAL | Status: DC | PRN

## 2012-05-03 MED ORDER — MUPIROCIN 2 % EX OINT
TOPICAL_OINTMENT | CUTANEOUS | Status: AC
  Filled 2012-05-03: qty 22

## 2012-05-03 MED ORDER — SODIUM CHLORIDE 0.9 % IR SOLN
Status: DC | PRN
  Administered 2012-05-03: 10:00:00

## 2012-05-03 MED ORDER — HYDRALAZINE HCL 20 MG/ML IJ SOLN: 10.0000 mg | INTRAMUSCULAR | Status: DC | PRN

## 2012-05-03 MED ORDER — FOLIC ACID 1 MG PO TABS
800.0000 ug | ORAL_TABLET | Freq: Every day | ORAL | Status: DC
  Administered 2012-05-03 – 2012-05-04 (×2): 1 mg via ORAL
  Filled 2012-05-03 (×2): qty 1

## 2012-05-03 MED ORDER — OXYCODONE HCL 5 MG PO TABS: 5.0000 mg | ORAL_TABLET | Freq: Once | ORAL | Status: DC | PRN

## 2012-05-03 MED ORDER — PROTAMINE SULFATE 10 MG/ML IV SOLN
INTRAVENOUS | Status: DC | PRN
  Administered 2012-05-03: 50 mg via INTRAVENOUS

## 2012-05-03 MED ORDER — GUAIFENESIN 100 MG/5ML PO SYRP
200.0000 mg | ORAL_SOLUTION | Freq: Four times a day (QID) | ORAL | Status: DC | PRN
  Filled 2012-05-03: qty 118

## 2012-05-03 MED ORDER — SODIUM CHLORIDE 0.9 % IV SOLN
INTRAVENOUS | Status: DC | PRN
  Administered 2012-05-03: 10:00:00 via INTRAVENOUS

## 2012-05-03 MED ORDER — PHENOL 1.4 % MT LIQD
1.0000 | OROMUCOSAL | Status: DC | PRN
  Filled 2012-05-03: qty 177

## 2012-05-03 MED ORDER — OXYCODONE HCL 5 MG/5ML PO SOLN: 5.0000 mg | Freq: Once | ORAL | Status: DC | PRN

## 2012-05-03 MED ORDER — PROMETHAZINE HCL 25 MG/ML IJ SOLN: 6.2500 mg | INTRAMUSCULAR | Status: DC | PRN

## 2012-05-03 MED ORDER — ASPIRIN 81 MG PO CHEW
81.0000 mg | CHEWABLE_TABLET | Freq: Every day | ORAL | Status: DC
  Administered 2012-05-03 – 2012-05-04 (×2): 81 mg via ORAL
  Filled 2012-05-03 (×2): qty 1

## 2012-05-03 MED ORDER — LABETALOL HCL 5 MG/ML IV SOLN
10.0000 mg | INTRAVENOUS | Status: DC | PRN
  Filled 2012-05-03: qty 4

## 2012-05-03 MED ORDER — B COMPLEX PO TABS: 1.0000 | ORAL_TABLET | Freq: Every day | ORAL | Status: DC

## 2012-05-03 MED ORDER — MAGIC MOUTHWASH
5.0000 mL | Freq: Every day | ORAL | Status: DC
  Administered 2012-05-03 – 2012-05-04 (×2): 5 mL via ORAL
  Filled 2012-05-03 (×2): qty 5

## 2012-05-03 MED ORDER — LAMIVUDINE 10 MG/ML PO SOLN
50.0000 mg | Freq: Every day | ORAL | Status: DC
  Administered 2012-05-03 – 2012-05-04 (×2): 50 mg via ORAL
  Filled 2012-05-03 (×2): qty 5

## 2012-05-03 MED ORDER — CALCIUM ACETATE 667 MG PO CAPS
1334.0000 mg | ORAL_CAPSULE | Freq: Three times a day (TID) | ORAL | Status: DC
Start: 2012-05-03 — End: 2012-05-04
  Administered 2012-05-03 – 2012-05-04 (×4): 1334 mg via ORAL
  Filled 2012-05-03 (×5): qty 2

## 2012-05-03 MED ORDER — SODIUM CHLORIDE 0.9 % IV SOLN: 250.0000 mL | INTRAVENOUS | Status: DC | PRN

## 2012-05-03 MED ORDER — METOPROLOL TARTRATE 1 MG/ML IV SOLN: 2.0000 mg | INTRAVENOUS | Status: DC | PRN

## 2012-05-03 MED ORDER — HEPARIN SODIUM (PORCINE) 1000 UNIT/ML IJ SOLN
INTRAMUSCULAR | Status: DC | PRN
  Administered 2012-05-03: 5000 [IU] via INTRAVENOUS

## 2012-05-03 MED ORDER — ACETAMINOPHEN 325 MG PO TABS
325.0000 mg | ORAL_TABLET | ORAL | Status: DC | PRN
  Administered 2012-05-03: 650 mg via ORAL
  Filled 2012-05-03: qty 2

## 2012-05-03 MED ORDER — LIDOCAINE-PRILOCAINE 2.5-2.5 % EX CREA
1.0000 "application " | TOPICAL_CREAM | CUTANEOUS | Status: DC | PRN
  Filled 2012-05-03: qty 5

## 2012-05-03 MED ORDER — ACETAMINOPHEN 650 MG RE SUPP: 325.0000 mg | RECTAL | Status: DC | PRN

## 2012-05-03 MED ORDER — DARUNAVIR ETHANOLATE 800 MG PO TABS
800.0000 mg | ORAL_TABLET | Freq: Every day | ORAL | Status: DC
  Administered 2012-05-04: 800 mg via ORAL
  Filled 2012-05-03 (×2): qty 1

## 2012-05-03 SURGICAL SUPPLY — 41 items
APL SKNCLS STERI-STRIP NONHPOA (GAUZE/BANDAGES/DRESSINGS) ×2
BENZOIN TINCTURE PRP APPL 2/3 (GAUZE/BANDAGES/DRESSINGS) ×3 IMPLANT
CANISTER SUCTION 2500CC (MISCELLANEOUS) ×2 IMPLANT
CLIP LIGATING EXTRA MED SLVR (CLIP) ×2 IMPLANT
CLIP LIGATING EXTRA SM BLUE (MISCELLANEOUS) ×2 IMPLANT
CLOTH BEACON ORANGE TIMEOUT ST (SAFETY) ×2 IMPLANT
COVER SURGICAL LIGHT HANDLE (MISCELLANEOUS) ×2 IMPLANT
DECANTER SPIKE VIAL GLASS SM (MISCELLANEOUS) IMPLANT
DRAPE INCISE IOBAN 66X45 STRL (DRAPES) ×4 IMPLANT
ELECT REM PT RETURN 9FT ADLT (ELECTROSURGICAL) ×2
ELECTRODE REM PT RTRN 9FT ADLT (ELECTROSURGICAL) ×1 IMPLANT
GEL ULTRASOUND 20GR AQUASONIC (MISCELLANEOUS) IMPLANT
GLOVE BIO SURGEON STRL SZ 6.5 (GLOVE) ×2 IMPLANT
GLOVE BIOGEL PI IND STRL 6.5 (GLOVE) IMPLANT
GLOVE BIOGEL PI IND STRL 7.0 (GLOVE) IMPLANT
GLOVE BIOGEL PI INDICATOR 6.5 (GLOVE) ×3
GLOVE BIOGEL PI INDICATOR 7.0 (GLOVE) ×1
GLOVE ORTHOPEDIC STR SZ6.5 (GLOVE) ×1 IMPLANT
GLOVE SS BIOGEL STRL SZ 7.5 (GLOVE) ×1 IMPLANT
GLOVE SUPERSENSE BIOGEL SZ 7.5 (GLOVE) ×1
GOWN PREVENTION PLUS XLARGE (GOWN DISPOSABLE) ×1 IMPLANT
GOWN STRL NON-REIN LRG LVL3 (GOWN DISPOSABLE) ×6 IMPLANT
GRAFT GORETEX 6X70 (Vascular Products) ×1 IMPLANT
KIT BASIN OR (CUSTOM PROCEDURE TRAY) ×2 IMPLANT
KIT ROOM TURNOVER OR (KITS) ×2 IMPLANT
NS IRRIG 1000ML POUR BTL (IV SOLUTION) ×2 IMPLANT
PACK CV ACCESS (CUSTOM PROCEDURE TRAY) ×2 IMPLANT
PAD ARMBOARD 7.5X6 YLW CONV (MISCELLANEOUS) ×4 IMPLANT
SPONGE GAUZE 4X4 12PLY (GAUZE/BANDAGES/DRESSINGS) ×3 IMPLANT
STRIP CLOSURE SKIN 1/2X4 (GAUZE/BANDAGES/DRESSINGS) ×3 IMPLANT
SUT PROLENE 6 0 CC (SUTURE) ×5 IMPLANT
SUT SILK 2 0 FS (SUTURE) IMPLANT
SUT VIC AB 2-0 CT1 27 (SUTURE) ×2
SUT VIC AB 2-0 CT1 TAPERPNT 27 (SUTURE) ×1 IMPLANT
SUT VIC AB 3-0 SH 27 (SUTURE) ×4
SUT VIC AB 3-0 SH 27X BRD (SUTURE) ×2 IMPLANT
TAPE CLOTH SURG 4X10 WHT LF (GAUZE/BANDAGES/DRESSINGS) ×1 IMPLANT
TOWEL OR 17X24 6PK STRL BLUE (TOWEL DISPOSABLE) ×2 IMPLANT
TOWEL OR 17X26 10 PK STRL BLUE (TOWEL DISPOSABLE) ×2 IMPLANT
UNDERPAD 30X30 INCONTINENT (UNDERPADS AND DIAPERS) ×2 IMPLANT
WATER STERILE IRR 1000ML POUR (IV SOLUTION) ×2 IMPLANT

## 2012-05-03 NOTE — Anesthesia Preprocedure Evaluation (Addendum)
Anesthesia Evaluation  Patient identified by MRN, date of birth, ID band Patient awake    Reviewed: Allergy & Precautions, H&P , NPO status , Patient's Chart, lab work & pertinent test results  History of Anesthesia Complications Negative for: history of anesthetic complications  Airway Mallampati: II TM Distance: >3 FB Neck ROM: Full    Dental  (+) Edentulous Upper and Edentulous Lower   Pulmonary former smoker,  breath sounds clear to auscultation  Pulmonary exam normal       Cardiovascular hypertension, + Peripheral Vascular Disease Rhythm:Regular Rate:Normal  2/14 ECHO: normal systolic function, valves OK   Neuro/Psych negative neurological ROS     GI/Hepatic negative GI ROS, (+)     substance abuse  cocaine use,   Endo/Other  negative endocrine ROS  Renal/GU ESRF and DialysisRenal disease (TuThSat dialysis, K+ 3.9)     Musculoskeletal   Abdominal   Peds  Hematology  (+) HIV,   Anesthesia Other Findings   Reproductive/Obstetrics                          Anesthesia Physical Anesthesia Plan  ASA: III  Anesthesia Plan: General   Post-op Pain Management:    Induction: Intravenous  Airway Management Planned: LMA  Additional Equipment:   Intra-op Plan:   Post-operative Plan:   Informed Consent: I have reviewed the patients History and Physical, chart, labs and discussed the procedure including the risks, benefits and alternatives for the proposed anesthesia with the patient or authorized representative who has indicated his/her understanding and acceptance.     Plan Discussed with: CRNA and Surgeon  Anesthesia Plan Comments: (Plan routine monitors, GA- LMA OK)        Anesthesia Quick Evaluation

## 2012-05-03 NOTE — Interval H&P Note (Signed)
History and Physical Interval Note:  05/03/2012 9:54 AM  [name]  has presented today for surgery, with the diagnosis of End Stage Renal Disease  The various methods of treatment have been discussed with the patient and family. After consideration of risks, benefits and other options for treatment, the patient has consented to  Procedure(s): INSERTION OF ARTERIOVENOUS (AV) GORE-TEX GRAFT THIGH (Left) as a surgical intervention .  The patient's history has been reviewed, patient examined, no change in status, stable for surgery.  I have reviewed the patient's chart and labs.  Questions were answered to the patient's satisfaction.     Tylan Kinn

## 2012-05-03 NOTE — Progress Notes (Signed)
Utilization Review Completed.[name] T4/28/2014

## 2012-05-03 NOTE — Op Note (Signed)
OPERATIVE REPORT  DATE OF SURGERY: 05/03/2012  PATIENT: [name], 57 y.o. female MRN: [id number]  DOB: [birth year]  PRE-OPERATIVE DIAGNOSIS: end stage renal disease  POST-OPERATIVE DIAGNOSIS:  Same  PROCEDURE: left femoral loop AV Gore-Tex graft  SURGEON:  Curt Jews, M.D.  ASSISTANT: nurse  ANESTHESIA:  Gen.  EBL: minimal ml  Total I/O In: 250 [I.V.:250] Out: 75 [Blood:75]  BLOOD ADMINISTERED: none  DRAINS: none  SPECIMEN: none  COUNTS CORRECT:  YES  PLAN OF CARE: PACU   PATIENT DISPOSITION:  PACU - hemodynamically stable  PROCEDURE DETAILS: The patient was taken to the operating room placed supine position where the area of the left groin and left anterior thigh and knee were prepped and draped in usual sterile fashion. An oblique incision was made at the groin crease over the common femoral artery pulse. The common femoral artery was encircled with a blue vessel it was of excellent caliber. The saphenous vein was identified at the saphenofemoral junction. Several tributary branches were ligated. 2 separate incisions were made over the distal thigh in a loop configuration tunnel were created. A 6 mm standard wall Gore-Tex graft was brought through the tunnel. The saphenous vein was occluded with a 2-0 silk Potts tie and the saphenofemoral junction was occluded with a Gregory clamp. The hood of the saphenous vein was opened up to the junction of the origin of the common femoral vein. The graft was spatulated and sewn end-to-side to the vein with a running 6-0 Prolene suture. Clamps removed and the vein graft of the graft was flushed with heparinized saline and reoccluded. Next the patient was given 5000 units of intravenous heparin. After adequate circulation time the common femoral artery was occluded proximally and distally and opened with an 11 blade and extended longitudinally with Potts scissors. The graft was cut to appropriate length and was sewn end-to-side to the  artery with a running 6-0 Prolene suture. Clamps removed and good thrill was noted. Wound irrigated with saline. Hemostasis was taken to the artery. The patient was given 50 mg of protamine to reverse the heparin. The distal thigh incisions were closed with 3-0 Vicryl in the subcutaneous and subcuticular tissue. The groin was closed with several layers of 2-0 Vicryl in the subcutaneous tissue and the skin was closed with 3-0 subcuticular Vicryl stitch. Sterile dressing was applied and the patient was taken to the recovery room in stable condition   Curt Jews, M.D. 05/03/2012 12:23 PM

## 2012-05-03 NOTE — H&P (View-Only) (Signed)
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 She's had an extensive past history of access. She had an infected below left upper arm AV graft removed a number of years ago and had difficult time with this actually requiring a axillary to brachial bypass with saphenous vein for critical ischemia of her left arm. She clearly is not option for left arm access. She does have palpable left popliteal pulse. She has marked edema and is having an additional dialysis treatments for this. I feel there next best option for access would be a left femoral loop graft. I discussed this at length with the patient who understands. We will schedule this on April 28 with an overnight admission and discharge for regular dialysis on the 29th.

## 2012-05-03 NOTE — Anesthesia Postprocedure Evaluation (Signed)
  Anesthesia Post-op Note  Patient: [name]  Procedure(s) Performed: Procedure(s): INSERTION OF ARTERIOVENOUS (AV) GORE-TEX GRAFT THIGH USING 28mm X 70cm Gore-Tex Graft  (Left)  Patient Location: PACU  Anesthesia Type:General  Level of Consciousness: awake, alert , oriented and patient cooperative  Airway and Oxygen Therapy: Patient Spontanous Breathing  Post-op Pain: none  Post-op Assessment: Post-op Vital signs reviewed, Patient's Cardiovascular Status Stable, Respiratory Function Stable, Patent Airway, No signs of Nausea or vomiting and Pain level controlled  Post-op Vital Signs: Reviewed and stable  Complications: No apparent anesthesia complications

## 2012-05-03 NOTE — Preoperative (Signed)
Beta Blockers   Reason not to administer Beta Blockers:Not Applicable 

## 2012-05-03 NOTE — Transfer of Care (Signed)
Immediate Anesthesia Transfer of Care Note  Patient: [name]  Procedure(s) Performed: Procedure(s): INSERTION OF ARTERIOVENOUS (AV) GORE-TEX GRAFT THIGH USING 59mm X 70cm Gore-Tex Graft  (Left)  Patient Location: PACU  Anesthesia Type:General  Level of Consciousness: awake, alert  and oriented  Airway & Oxygen Therapy: Patient Spontanous Breathing and Patient connected to nasal cannula oxygen  Post-op Assessment: Report given to PACU RN, Post -op Vital signs reviewed and stable and Patient moving all extremities  Post vital signs: Reviewed and stable  Complications: No apparent anesthesia complications

## 2012-05-04 ENCOUNTER — Encounter (HOSPITAL_COMMUNITY): Payer: Self-pay | Admitting: Vascular Surgery

## 2012-05-04 LAB — HEPATITIS B SURFACE ANTIGEN: Hepatitis B Surface Ag: NEGATIVE

## 2012-05-04 MED ORDER — ALBUMIN HUMAN 25 % IV SOLN: 25.0000 g | Freq: Once | INTRAVENOUS | Status: AC

## 2012-05-04 MED ORDER — SODIUM CHLORIDE 0.9 % IV SOLN
Freq: Once | INTRAVENOUS | Status: AC
  Administered 2012-05-04: 01:00:00 via INTRAVENOUS

## 2012-05-04 MED ORDER — OXYCODONE HCL 5 MG PO TABS: 5.0000 mg | ORAL_TABLET | Freq: Four times a day (QID) | ORAL | Status: DC | PRN

## 2012-05-04 MED ORDER — ALBUMIN HUMAN 25 % IV SOLN
INTRAVENOUS | Status: AC
  Administered 2012-05-04: 25 g via INTRAVENOUS
  Filled 2012-05-04: qty 100

## 2012-05-04 NOTE — Progress Notes (Addendum)
[facility]  Subjective  - Comfortable, no new complaints.  She does not have a plan for dialysis.  She does dialysis normally T-Th-Sat.  She currently lives at [facility].   Objective 89/48 64 97.3 F (36.3 C) (Oral) 16 98%  Intake/Output Summary (Last 24 hours) at 05/04/12 0741 Last data filed at 05/04/12 0038  Gross per 24 hour  Intake    500 ml  Output     75 ml  Net    425 ml    Left thigh graft has a good audible thrill. Pin point bleeding at incision sites.  Clean dry dressing applied. Distally feet grossly N/V/M intact bilaterally.   Assessment/Planning: POD #1  S/P left thigh graft  We will ask Social work to assist [place] to get her returned to [employer] and to her dialysis today.     Laurence Slate [facility] 05/04/2012 7:41 AM --  Laboratory Lab Results:  Recent Labs  05/03/12 0718 05/03/12 1615  WBC  --  7.0  HGB 15.3* 12.8  HCT 45.0 39.0  PLT  --  150   BMET  Recent Labs  05/03/12 0718 05/03/12 1615  NA 137 133*  K 3.9 4.7  CL  --  96  CO2  --  25  GLUCOSE 78 90  BUN  --  51*  CREATININE  --  7.29*  CALCIUM  --  9.3    COAG Lab Results  Component Value Date   INR 1.60* 05/18/2011   INR 1.32 12/24/2010   INR 1.50* 09/11/2010   No results found for this basename: PTT      I have examined the patient, reviewed and agree with above. Mild bruising. The wound healing well area the bruit present. For discharge this morning. The patient prefers not to go dialysis this afternoon. We will coordinate with renal service. Rufina Kimery, MD 05/04/2012 8:13 AM

## 2012-05-04 NOTE — Procedures (Signed)
This is a late entry note.Marland KitchenMarland KitchenPatient was seen on dialysis and the procedure was supervised.  BFR 300  Via PC BP is  Low, likely due to anesthesia, will try albumin to increase BP and allow more fluid removal.   Patient appears to be tolerating treatment well  Seleni Meller A 05/04/2012

## 2012-05-04 NOTE — Progress Notes (Signed)
05/04/2012 1700 Upon return from dialysis, pt. bp noted to be 90/53. Gerri [facility] paged and made aware. Verbal orders received ok to still d/c patient to [facility]. Orders enacted. Will continue to closely monitor patient.  Breylin Dom, Arville Lime

## 2012-05-04 NOTE — Clinical Social Work Psychosocial (Signed)
     Clinical Social Work Department BRIEF PSYCHOSOCIAL ASSESSMENT 05/04/2012  Patient:  [name], [name]     Account Number:  [id number]     Admit date:  05/03/2012  Clinical Social Worker:  Katrinka Blazing  Date/Time:  05/04/2012 11:12 AM  Referred by:  Physician  Date Referred:  05/04/2012 Referred for  SNF Placement   Other Referral:   Pt admitted from [facility] last night, ready for return.   Interview type:  Patient Other interview type:    PSYCHOSOCIAL DATA Living Status:  FACILITY Admitted from facility:  [facility] Level of care:  [place] Primary support name:  Camile Rose: [phone] Primary support relationship to patient:  [place] Degree of support available:   Unknown.    CURRENT CONCERNS Current Concerns  Post-Acute Placement   Other Concerns:    SOCIAL WORK ASSESSMENT / PLAN Clinical Social Worker received referral indicating pt was admitted from [facility] last night and is ready to return today.  CSW reviewed chart and met with pt.  Pt agreeable to return today.  Pt inquiring about her HD today.  RN currently working with HD to attempt to secure pt [name] position in HD today as she missed her morning appointment due to hospitalization.  CSW staffed case with RNCM.  CSW updated SNF and submitted DC Summary and AVS. CSW to continue to follow and assist as needed.   Assessment/plan status:  Information/Referral to [employer] Other assessment/ plan:   Information/referral to community resources:   SNF    PATIENTS/FAMILYS RESPONSE TO PLAN OF CARE: Pt was plesant and thanked CSW for intervention.

## 2012-05-04 NOTE — Progress Notes (Signed)
Seen on dialysis. Had new left thigh AVGG placed yesterday. + bruit and thrill with ecchymosis. Groind dressing intact. No heparin HD today. BP 85/62 P 67 with 2.2 OFF - net uf will be about 1.5 liters today. Lungs clear. To return to usual HD Thursday.   Flossie Buffy, PA-C [employer]

## 2012-05-04 NOTE — Progress Notes (Signed)
Pt's BP is 90/50 after administration of 250 ns bolus. Pt is resting in bed and remains asymptomatic. Will continue vitals every 4 hours as ordered.

## 2012-05-04 NOTE — Progress Notes (Signed)
Pt BP by dinamap reading 76/40. Rechecked manually got 89/48 in left arm. Verified systolic reading with doppler. Pt states she feels well.

## 2012-05-04 NOTE — Progress Notes (Addendum)
05/04/2012 6:28 PM Nursing note  Report called to [facility] SNF. Questions and concerns addressed. Discharge avs form, medications already taken today and those due this evening given and explained to patient as well. Follow up appointments, incision site care and when to call md also reviewed. Iv line d/c by NT. D/c tele. Ambulance transport contacted to d/c pt. Back to [facility].  Zoiey Christy, Arville Lime

## 2012-05-04 NOTE — Discharge Summary (Signed)
[facility] Discharge Summary   Patient ID:  [name] MRN: [id number] DOB/AGE: January 16, 1955 57 y.o.  Admit date: 05/03/2012 Discharge date: 05/04/2012 Date of Surgery: 05/03/2012 Surgeon: Surgeon(s): Rosetta Posner, MD  Admission Diagnosis: End Stage Renal Disease  Discharge Diagnoses:  End Stage Renal Disease  Secondary Diagnoses: Past Medical History  Diagnosis Date  . Human immunodeficiency virus (HIV) disease   . Hypertension   . Clostridium difficile infection 04/04/10    07/2011 and 08/2011  . Bacteriuria, asymptomatic 04/04/10    Culture grew VRE sensitive to linesolid   . MGUS (monoclonal gammopathy of unknown significance)   . History of bacteremia     MSSA; 05/2011, 08/2011  . Anemia   . Systolic heart failure     08/27/11 EF 35-40%  . Cancer   . ESRD (end stage renal disease) 03/19/2007    Started HD in 2010.  ESRD was due to HIV per pt. Gets HD on [employer] at [facility] on TTS schedule.     . Peripheral vascular disease     Procedure(s): INSERTION OF ARTERIOVENOUS (AV) GORE-TEX GRAFT THIGH USING 70mm X 70cm Gore-Tex Graft   Discharged Condition: stable  HPI:  Patient is a for a followup of removal of infected right upper arm AV graft and right IJ dietetic catheter placement on 03/29/2012. She's had an extensive past history of access. She had an infected below left upper arm AV graft removed a number of years ago and had difficult time with this actually requiring a axillary to brachial bypass with saphenous vein for critical ischemia of her left arm. She clearly is not option for left arm access. She does have palpable left popliteal pulse. She has marked edema and is having an additional dialysis treatments for this. I feel there next best option for access would be a left femoral loop graft. I discussed this at length with the patient who understands. We will schedule this on April 28 with an overnight admission and discharge for regular dialysis  on the 29th.     Post-op left thigh graft she was admitted over night for observation.  She did have an episode of hypotension which was treated with a 200 cc bolus NS.  BP is stable without symptoms.  Dressing were changed and she has no new complaints.      Hospital Course:  AIVA WALLENSTEIN is a 57 y.o. female is S/P Left Procedure(s): INSERTION OF ARTERIOVENOUS (AV) GORE-TEX GRAFT THIGH USING 19mm X 70cm Gore-Tex Graft  Extubated: POD # 0 Physical exam: Left thigh graft has a good audible thrill.  Pin point bleeding at incision sites. Clean dry dressing applied.  Distally feet grossly N/V/M intact bilaterally.  Post-op wounds healing well Pt. Ambulating, voiding and taking PO diet without difficulty. Pt pain controlled with PO pain meds. Labs as below Complications:Hypotention see HPI for details.  Consults:   I called Jerrye Beavers the dialysis PA and she is going to call her dialysis center to alert them she will be coming there this afternoon.  Significant Diagnostic Studies: CBC Lab Results  Component Value Date   WBC 7.0 05/03/2012   HGB 12.8 05/03/2012   HCT 39.0 05/03/2012   MCV 103.7* 05/03/2012   PLT 150 05/03/2012    BMET    Component Value Date/Time   NA 133* 05/03/2012 1615   K 4.7 05/03/2012 1615   CL 96 05/03/2012 1615   CO2 25 05/03/2012 1615   GLUCOSE 90 05/03/2012  1615   BUN 51* 05/03/2012 1615   CREATININE 7.29* 05/03/2012 1615   CREATININE 5.81* 04/05/2012 0925   CALCIUM 9.3 05/03/2012 1615   CALCIUM 7.6* 09/14/2007 1321   GFRNONAA 6* 05/03/2012 1615   GFRAA 7* 05/03/2012 1615   COAG Lab Results  Component Value Date   INR 1.60* 05/18/2011   INR 1.32 12/24/2010   INR 1.50* 09/11/2010     Disposition:  Discharge to :[place] [facility] Discharge Orders   Future Appointments Provider Department Dept Phone   05/24/2012 11:15 AM [facility] for Infectious Disease [phone]   06/07/2012 10:00 AM Truman Hayward, MD [facility] for Infectious Disease [phone]   Future Orders Complete By Expires     Activity as tolerated - No restrictions  As directed     Call MD for:  redness, tenderness, or signs of infection (pain, swelling, bleeding, redness, odor or green/yellow discharge around incision site)  As directed     Call MD for:  redness, tenderness, or signs of infection (pain, swelling, bleeding, redness, odor or green/yellow discharge around incision site)  As directed     Call MD for:  severe or increased pain, loss or decreased feeling  in affected limb(s)  As directed     Call MD for:  severe or increased pain, loss or decreased feeling  in affected limb(s)  As directed     Call MD for:  temperature >100.5  As directed     Call MD for:  temperature >100.5  As directed     Discharge instructions  As directed     Comments:      Dry dressing over left thigh incisions until no bleeding.    Driving Restrictions  As directed     Comments:      No driving for 24 hours    Increase activity slowly  As directed     Comments:      Walk with assistance use walker or cane as needed    Lifting restrictions  As directed     Comments:      No lifting for 6 weeks    Resume previous diet  As directed     Resume previous diet  As directed         Medication List    TAKE these medications       abacavir 300 MG tablet  Commonly known as:  ZIAGEN  Take 600 mg by mouth daily.     aspirin 81 MG chewable tablet  Chew 81 mg by mouth daily.     b complex vitamins tablet  Take 1 tablet by mouth daily.     calcium acetate 667 MG capsule  Commonly known as:  PHOSLO  Take 2 capsules (1,334 mg total) by mouth 3 (three) times daily with meals.     CERTA-VITE PO  Take 1 tablet by mouth daily.     Darunavir Ethanolate 800 MG tablet  Commonly known as:  PREZISTA  Take 1 tablet (800 mg total) by mouth daily with breakfast.     folic acid A999333 MCG tablet  Commonly known as:  FOLVITE  Take 800 mcg by  mouth daily.     guaifenesin 100 MG/5ML syrup  Commonly known as:  ROBITUSSIN  Take 200 mg by mouth every 6 (six) hours as needed for cough or congestion.     lamiVUDine 10 MG/ML solution  Commonly known as:  EPIVIR  Take 5 mLs (50 mg total) by mouth daily.     lidocaine-prilocaine cream  Commonly known as:  EMLA  Apply 1 application topically as needed (to right arm 30 minutes prior to leaving for dialysis (wrap with saran wrap)).     magic mouthwash Soln  Take 5 mLs by mouth daily after supper.     midodrine 10 MG tablet  Commonly known as:  PROAMATINE  Take 10 mg by mouth 2 (two) times daily at 10 AM and 5 PM.     oxyCODONE 5 MG immediate release tablet  Commonly known as:  Oxy IR/ROXICODONE  Take 1 tablet (5 mg total) by mouth every 6 (six) hours as needed. For pain     oxyCODONE 5 MG immediate release tablet  Commonly known as:  Oxy IR/ROXICODONE  Take 1 tablet (5 mg total) by mouth every 6 (six) hours as needed.     ritonavir 100 MG Tabs  Commonly known as:  NORVIR  Take 1 tablet (100 mg total) by mouth daily with breakfast.       Verbal and written Discharge instructions given to the patient. Wound care per Discharge AVS     Follow-up Information   Follow up with Elin Fenley, MD In 6 weeks. (sent)    Contact information:   [address] [place] [address] [phone]       Signed: Laurence Slate [facility] 05/04/2012, 8:05 AM

## 2012-05-04 NOTE — Progress Notes (Addendum)
05/04/2012 6:27 PM Nursing note Clarified with Renal MD Dr. Moshe Cipro per verbal orders [name] [address] [facility] pt. Ok to d/c to [facility] related to hypotension earlier in shift. Verbal orders received ok to d/c to [facility] snf. BP now 108/61. [name], Arville Lime

## 2012-05-04 NOTE — Progress Notes (Signed)
Pt BP is 80/40. Called Dr. Oneida Alar and received orders for 250 NS bolus IV now.

## 2012-05-04 NOTE — Progress Notes (Signed)
Clinical Social Work Department BRIEF PSYCHOSOCIAL ASSESSMENT 05/04/2012  Patient:  [name], [name]     Account Number:  [id number]     Admit date:  05/03/2012  Clinical Social Worker:  Katrinka Blazing  Date/Time:  05/04/2012 11:12 AM  Referred by:  Physician  Date Referred:  05/04/2012 Referred for  SNF Placement   Other Referral:   Pt admitted from [facility] last night, ready for return.   Interview type:  Patient Other interview type:    PSYCHOSOCIAL DATA Living Status:  FACILITY Admitted from facility:  [facility] Level of care:  [place] Primary support name:  [name]: [id number] Primary support relationship to patient:  SIBLING Degree of support available:   Unknown.    CURRENT CONCERNS Current Concerns  Post-Acute Placement   Other Concerns:    SOCIAL WORK ASSESSMENT / PLAN Clinical Social Worker received referral indicating pt was admitted from [facility] last night and is ready to return today.  CSW reviewed chart and met with pt.  Pt agreeable to return today.  Pt inquiring about her HD today.  RN currently working with HD to attempt to secure pt [name] position in HD today as she missed her morning appointment due to hospitalization.  CSW staffed case with RNCM.  CSW updated SNF and submitted DC Summary and AVS. CSW to continue to follow and assist as needed.   Assessment/plan status:  Information/Referral to [employer] Other assessment/ plan:   Information/referral to community resources:   SNF    PATIENT'S/FAMILY'S RESPONSE TO PLAN OF CARE: Pt was plesant and thanked CSW for intervention.

## 2012-05-04 NOTE — Care Management Note (Signed)
    Page 1 of 1   05/04/2012     4:21:04 PM   CARE MANAGEMENT NOTE 05/04/2012  Patient:  [name], [name]   Account Number:  [id number]  Date Initiated:  05/04/2012  Documentation initiated by:  Tiara Bartoli  Subjective/Objective Assessment:   PT ADM ON 05/03/12 FOR FISTULA PLACEMENT.  PTA, PT RESIDES AT [facility].     Action/Plan:   PT TO RETURN TO [facility] TODAY AFTER DIALYSIS.  CSW TO FACILITATE THIS.   Anticipated DC Date:  05/04/2012   Anticipated DC Plan:  SKILLED NURSING FACILITY  In-house referral  Clinical Social Worker      DC Planning Services  CM consult      Choice offered to / List presented to:             Status of service:  Completed, signed off Medicare Important Message given?   (If response is "NO", the following Medicare IM given date fields will be blank) Date Medicare IM given:   Date Additional Medicare IM given:    Discharge Disposition:  [place]  Per UR Regulation:  Reviewed for med. necessity/level of care/duration of stay  If discussed at [place] of Stay Meetings, dates discussed:    Comments:

## 2012-05-20 ENCOUNTER — Non-Acute Institutional Stay (SKILLED_NURSING_FACILITY): Payer: Medicaid Other | Admitting: Adult Health

## 2012-05-20 ENCOUNTER — Other Ambulatory Visit: Payer: Self-pay

## 2012-05-20 ENCOUNTER — Encounter: Payer: Self-pay | Admitting: Adult Health

## 2012-05-20 DIAGNOSIS — N186 End stage renal disease: Secondary | ICD-10-CM

## 2012-05-20 DIAGNOSIS — I959 Hypotension, unspecified: Secondary | ICD-10-CM

## 2012-05-20 DIAGNOSIS — B20 Human immunodeficiency virus [HIV] disease: Secondary | ICD-10-CM

## 2012-05-20 NOTE — Assessment & Plan Note (Signed)
Is followed by I/D; without change in status will continue zigen 600 mg daily; prezista 800 mg daily; epivir 50 mg daily; norvir 100 mg daily will continue to monitor her status

## 2012-05-20 NOTE — Assessment & Plan Note (Signed)
Will continue 1200 fluid restriction; hemodialysis three days per week; phoslo 667 (2) tabs three times daily

## 2012-05-20 NOTE — Progress Notes (Signed)
Patient ID: [name], female   DOB: May 22, 1955, 57 y.o.   MRN: [id number]  FACILITY: [facility]  Allergies  Allergen Reactions  . Penicillins Nausea And Vomiting and Rash     Chief Complaint  Patient presents with  . Medical Managment of Chronic Issues    HPI: She is being seen for the management of chronic illnesses; there is no overall change in her status; there are no reports of concerns per the nursing staff. She continues to be on hemodialysis three days per week; and is followed for her hiv status.   Past Medical History  Diagnosis Date  . Human immunodeficiency virus (HIV) disease   . Hypertension   . Clostridium difficile infection 04/04/10    07/2011 and 08/2011  . Bacteriuria, asymptomatic 04/04/10    Culture grew VRE sensitive to linesolid   . MGUS (monoclonal gammopathy of unknown significance)   . History of bacteremia     MSSA; 05/2011, 08/2011  . Anemia   . Systolic heart failure     08/27/11 EF 35-40%  . Cancer   . ESRD (end stage renal disease) 03/19/2007    Started HD in 2010.  ESRD was due to HIV per pt. Gets HD on [employer] at [facility] on TTS schedule.     . Peripheral vascular disease     Past Surgical History  Procedure Laterality Date  . Av fistula placement    . Insertion of dialysis catheter  12/30/2010    Procedure: INSERTION OF DIALYSIS CATHETER;  Surgeon: Elam Dutch, MD;  Location: [facility];  Service: Vascular;  Laterality: Left;  Insertion of left internal jugular dialysis catheter  . Vascular surgery    . Insertion of dialysis catheter  05/26/2011    Procedure: INSERTION OF DIALYSIS CATHETER;  Surgeon: Conrad Lake Roesiger, MD;  Location: [place];  Service: Vascular;  Laterality: N/A;  Insertion tunneled dialysis catheter in Right Internal Jugular with 23 cm catheter   . Insertion of dialysis catheter  08/26/2011    Procedure: INSERTION OF DIALYSIS CATHETER;  Surgeon: Elam Dutch, MD;  Location: [facility] OR;  Service: Vascular;  Laterality: Right;   insertion of Right Femoral vein Dialysis catheter  . Tee without cardioversion  08/27/2011    Procedure: TRANSESOPHAGEAL ECHOCARDIOGRAM (TEE);  Surgeon: Pixie Casino, MD;  Location: [place];  Service: Cardiovascular;  Laterality: N/A;  . Av fistula placement  09/01/2011    Procedure: INSERTION OF ARTERIOVENOUS (AV) GORE-TEX GRAFT ARM;  Surgeon: Rosetta Posner, MD;  Location: [facility] OR;  Service: Vascular;  Laterality: Right;  Using 11mm x 50  stretch goretex graft  . Thrombectomy and revision of arterioventous (av) goretex  graft Right 02/13/2012    Procedure: THROMBECTOMY AND REVISION OF ARTERIOVENTOUS (AV) GORETEX  GRAFT;  Surgeon: Angelia Mould, MD;  Location: [place];  Service: Vascular;  Laterality: Right;  . Tee without cardioversion N/A 02/16/2012    Procedure: TRANSESOPHAGEAL ECHOCARDIOGRAM (TEE);  Surgeon: Fay Records, MD;  Location: [place];  Service: Cardiovascular;  Laterality: N/A;  Rm [id number]  . Avgg removal Right 03/29/2012    Procedure: REMOVAL OF ARTERIOVENOUS GORETEX GRAFT ([place]);  Surgeon: Rosetta Posner, MD;  Location: [place];  Service: Vascular;  Laterality: Right;  . Insertion of dialysis catheter N/A 03/29/2012    Procedure: INSERTION OF DIALYSIS CATHETER;  Surgeon: Rosetta Posner, MD;  Location: [place];  Service: Vascular;  Laterality: N/A;  Ultrasound guided  . Av fistula placement  Left 05/03/2012    Procedure: INSERTION OF ARTERIOVENOUS (AV) GORE-TEX GRAFT THIGH USING 68mm X 70cm Gore-Tex Graft ;  Surgeon: Rosetta Posner, MD;  Location: [facility] OR;  Service: Vascular;  Laterality: Left;    VITAL SIGNS BP 133/83  Pulse 73  Ht 5\' 5"  (1.651 m)  Wt 169 lb (76.658 kg)  BMI 28.12 kg/m2   Patient's Medications  New Prescriptions   No medications on file  Previous Medications   ABACAVIR (ZIAGEN) 300 MG TABLET    Take 600 mg by mouth daily.   ALUM & MAG HYDROXIDE-SIMETH (MAGIC MOUTHWASH) SOLN    Take 5 mLs by mouth daily after supper.   ASPIRIN 81 MG CHEWABLE TABLET    Chew 81 mg  by mouth daily.   B COMPLEX VITAMINS TABLET    Take 1 tablet by mouth daily.     CALCIUM ACETATE (PHOSLO) 667 MG CAPSULE    Take 2 capsules (1,334 mg total) by mouth 3 (three) times daily with meals.   DARUNAVIR ETHANOLATE (PREZISTA) 800 MG TABLET    Take 1 tablet (800 mg total) by mouth daily with breakfast.   FOLIC ACID (FOLVITE) A999333 MCG TABLET    Take 800 mcg by mouth daily.   GUAIFENESIN (ROBITUSSIN) 100 MG/5ML SYRUP    Take 200 mg by mouth every 6 (six) hours as needed for cough or congestion.   LAMIVUDINE (EPIVIR) 10 MG/ML SOLUTION    Take 5 mLs (50 mg total) by mouth daily.   LIDOCAINE-PRILOCAINE (EMLA) CREAM    Apply 1 application topically as needed (to right arm 30 minutes prior to leaving for dialysis (wrap with saran wrap)).   MIDODRINE (PROAMATINE) 10 MG TABLET    Take 10 mg by mouth 2 (two) times daily at 10 AM and 5 PM.   MULTIPLE VITAMINS-MINERALS (CERTA-VITE PO)    Take 1 tablet by mouth daily.   OXYCODONE (OXY IR/ROXICODONE) 5 MG IMMEDIATE RELEASE TABLET    Take 1 tablet (5 mg total) by mouth every 6 (six) hours as needed. For pain   OXYCODONE (OXY IR/ROXICODONE) 5 MG IMMEDIATE RELEASE TABLET    Take 1 tablet (5 mg total) by mouth every 6 (six) hours as needed.   RITONAVIR (NORVIR) 100 MG TABS    Take 1 tablet (100 mg total) by mouth daily with breakfast.  Modified Medications   No medications on file  Discontinued Medications   No medications on file    SIGNIFICANT DIAGNOSTIC EXAMS    02-07-12: wbc 6.4; hgb 11.6; hct 35.7; mcv 112; plt 303; abs CD4: 330;  hiv1 >50.00 02-26-12: wbc 8.5; hgb 10.5; hct 32.6; mcv 108; plt 337; glucose 90; bun 25; creat 5.30; k+ 4.8 Na++134; protein 8.9; albumin 3.5; globulin 5.4      Component Value Date/Time   ALBUMIN 2.9* 05/03/2012 1615   AST 25 05/03/2012 1615   ALT 9 05/03/2012 1615   ALKPHOS 236* 05/03/2012 1615   BILITOT 0.3 05/03/2012 1615       Component Value Date/Time   BUN 51* 05/03/2012 1615   GLUCOSE 90 05/03/2012 1615    CREATININE 7.29* 05/03/2012 1615   CREATININE 5.81* 04/05/2012 0925   K 4.7 05/03/2012 1615   NA 133* 05/03/2012 1615       Component Value Date/Time   WBC 7.0 05/03/2012 1615   RBC 3.76* 05/03/2012 1615   HGB 12.8 05/03/2012 1615   HCT 39.0 05/03/2012 1615   PLT 150 05/03/2012 1615   MCV 103.7* 05/03/2012 1615  Review of Systems  Constitutional: Negative for malaise/fatigue.  Respiratory: Negative for cough and shortness of breath.   Cardiovascular: Negative for chest pain, palpitations and leg swelling.  Gastrointestinal: Negative for heartburn, abdominal pain and constipation.  Musculoskeletal: Negative for myalgias and joint pain.  Skin: Negative.   Neurological: Negative for headaches.  Psychiatric/Behavioral: Negative for depression and memory loss.     Physical Exam  Constitutional: She is oriented to person, place, and time. She appears well-developed and well-nourished.  Neck: Neck supple. No thyromegaly present.  Cardiovascular: Normal rate, regular rhythm and intact distal pulses.   Respiratory: Effort normal and breath sounds normal.  GI: Soft. Bowel sounds are normal.  Musculoskeletal: Normal range of motion. She exhibits no edema.  Neurological: She is alert and oriented to person, place, and time.  Skin: Skin is warm and dry.  Psychiatric: She has a normal mood and affect.       ASSESSMENT/ PLAN:  HIV INFECTION Is followed by I/D; without change in status will continue zigen 600 mg daily; prezista 800 mg daily; epivir 50 mg daily; norvir 100 mg daily will continue to monitor her status   Hypotension, intermittent esp after HD She is stable will continue midodrine 10 mg twice daily   ESRD (end stage renal disease) Will continue 1200 fluid restriction; hemodialysis three days per week; phoslo 667 (2) tabs three times daily

## 2012-05-20 NOTE — Assessment & Plan Note (Signed)
She is stable will continue midodrine 10 mg twice daily

## 2012-05-23 MED ORDER — VANCOMYCIN HCL IN DEXTROSE 1-5 GM/200ML-% IV SOLN
1000.0000 mg | INTRAVENOUS | Status: DC
  Filled 2012-05-23: qty 200

## 2012-05-24 ENCOUNTER — Ambulatory Visit (HOSPITAL_COMMUNITY)
Admission: RE | Admit: 2012-05-24 | Discharge: 2012-05-24 | Disposition: A | Discharge status: Skilled Nursing Facility | Payer: Medicaid Other | Source: Ambulatory Visit | Attending: Vascular Surgery | Admitting: Vascular Surgery

## 2012-05-24 ENCOUNTER — Encounter: Payer: Self-pay | Admitting: Vascular Surgery

## 2012-05-24 ENCOUNTER — Encounter (HOSPITAL_COMMUNITY)
Admission: RE | Disposition: A | Discharge status: Skilled Nursing Facility | Payer: Self-pay | Source: Ambulatory Visit | Attending: Vascular Surgery

## 2012-05-24 ENCOUNTER — Other Ambulatory Visit: Payer: Medicaid Other

## 2012-05-24 ENCOUNTER — Encounter (HOSPITAL_COMMUNITY): Payer: Self-pay | Admitting: *Deleted

## 2012-05-24 DIAGNOSIS — Z538 Procedure and treatment not carried out for other reasons: Secondary | ICD-10-CM | POA: Insufficient documentation

## 2012-05-24 DIAGNOSIS — N186 End stage renal disease: Secondary | ICD-10-CM | POA: Insufficient documentation

## 2012-05-24 LAB — POCT I-STAT 4, (NA,K, GLUC, HGB,HCT)
Glucose, Bld: 80 mg/dL (ref 70–99)
HCT: 42 % (ref 36.0–46.0)
Hemoglobin: 14.3 g/dL (ref 12.0–15.0)
Potassium: 5.1 mEq/L (ref 3.5–5.1)
Sodium: 137 mEq/L (ref 135–145)

## 2012-05-24 SURGERY — CANCELLED PROCEDURE

## 2012-05-24 MED ORDER — SODIUM CHLORIDE 0.9 % IV SOLN: INTRAVENOUS | Status: DC

## 2012-05-24 SURGICAL SUPPLY — 36 items
ADH SKN CLS APL DERMABOND .7 (GAUZE/BANDAGES/DRESSINGS) ×1
ARMBAND PINK RESTRICT EXTREMIT (MISCELLANEOUS) ×3 IMPLANT
CANISTER SUCTION 2500CC (MISCELLANEOUS) ×3 IMPLANT
CATH EMB 4FR 80CM (CATHETERS) ×3 IMPLANT
CLIP TI MEDIUM 6 (CLIP) ×3 IMPLANT
CLIP TI WIDE RED SMALL 6 (CLIP) ×3 IMPLANT
CLOTH BEACON ORANGE TIMEOUT ST (SAFETY) ×3 IMPLANT
COVER SURGICAL LIGHT HANDLE (MISCELLANEOUS) ×3 IMPLANT
DECANTER SPIKE VIAL GLASS SM (MISCELLANEOUS) ×3 IMPLANT
DERMABOND ADVANCED (GAUZE/BANDAGES/DRESSINGS) ×1
DERMABOND ADVANCED .7 DNX12 (GAUZE/BANDAGES/DRESSINGS) ×2 IMPLANT
DRAPE X-RAY CASS 24X20 (DRAPES) IMPLANT
ELECT REM PT RETURN 9FT ADLT (ELECTROSURGICAL) ×2
ELECTRODE REM PT RTRN 9FT ADLT (ELECTROSURGICAL) ×2 IMPLANT
GEL ULTRASOUND 20GR AQUASONIC (MISCELLANEOUS) IMPLANT
GLOVE BIO SURGEON STRL SZ7.5 (GLOVE) ×3 IMPLANT
GOWN PREVENTION PLUS XLARGE (GOWN DISPOSABLE) ×3 IMPLANT
GOWN STRL NON-REIN LRG LVL3 (GOWN DISPOSABLE) ×6 IMPLANT
KIT BASIN OR (CUSTOM PROCEDURE TRAY) ×3 IMPLANT
KIT ROOM TURNOVER OR (KITS) ×3 IMPLANT
LOOP VESSEL MINI RED (MISCELLANEOUS) IMPLANT
NS IRRIG 1000ML POUR BTL (IV SOLUTION) ×3 IMPLANT
PACK CV ACCESS (CUSTOM PROCEDURE TRAY) ×3 IMPLANT
PAD ARMBOARD 7.5X6 YLW CONV (MISCELLANEOUS) ×6 IMPLANT
SET COLLECT BLD 21X3/4 12 (NEEDLE) IMPLANT
SPONGE SURGIFOAM ABS GEL 100 (HEMOSTASIS) IMPLANT
STOPCOCK 4 WAY LG BORE MALE ST (IV SETS) IMPLANT
SUT PROLENE 6 0 CC (SUTURE) ×3 IMPLANT
SUT VIC AB 3-0 SH 27 (SUTURE) ×2
SUT VIC AB 3-0 SH 27X BRD (SUTURE) ×2 IMPLANT
SUT VICRYL 4-0 PS2 18IN ABS (SUTURE) ×3 IMPLANT
TOWEL OR 17X24 6PK STRL BLUE (TOWEL DISPOSABLE) ×3 IMPLANT
TOWEL OR 17X26 10 PK STRL BLUE (TOWEL DISPOSABLE) ×3 IMPLANT
TUBING EXTENTION W/L.L. (IV SETS) IMPLANT
UNDERPAD 30X30 INCONTINENT (UNDERPADS AND DIAPERS) ×3 IMPLANT
WATER STERILE IRR 1000ML POUR (IV SOLUTION) ×3 IMPLANT

## 2012-05-24 NOTE — Progress Notes (Signed)
Listened to patient's graft with stethoscope and able to hear bruit. Dr. Oneida Alar notified. He will come by to check patient before we proceed with getting her ready for the OR.

## 2012-05-24 NOTE — Progress Notes (Signed)
Dr. Oneida Alar in to see patient. Graft is patent. Patient discharged back to [facility] facility with caregiver via wheelchair.

## 2012-05-24 NOTE — Progress Notes (Unsigned)
Patient ID: [name], female   DOB: 04/14/1955, 57 y.o.   MRN: [id number]   Pt referred today for possible thrombectomy and revision of left thigh graft.  Pt was noted to have audible bruit in graft by Short Stay Nurse.  On my exam she has audible bruit and palpable thrill on the lateral aspect of the graft.  All incisions are healing, no drainage no erythema.  Graft should be ready for use in another week or so.  Will cancel OR today.  Ruta Hinds, MD [facility] [place] Office: [phone] Pager: [phone]

## 2012-05-26 ENCOUNTER — Other Ambulatory Visit: Payer: Self-pay | Admitting: Infectious Disease

## 2012-05-26 ENCOUNTER — Other Ambulatory Visit (INDEPENDENT_AMBULATORY_CARE_PROVIDER_SITE_OTHER): Payer: Medicaid Other

## 2012-05-26 DIAGNOSIS — B20 Human immunodeficiency virus [HIV] disease: Secondary | ICD-10-CM

## 2012-05-26 LAB — CBC WITH DIFFERENTIAL/PLATELET
Basophils Absolute: 0 10*3/uL (ref 0.0–0.1)
Basophils Relative: 0 % (ref 0–1)
Eosinophils Absolute: 0.2 10*3/uL (ref 0.0–0.7)
Eosinophils Relative: 5 % (ref 0–5)
HCT: 34.4 % — ABNORMAL LOW (ref 36.0–46.0)
Hemoglobin: 11.7 g/dL — ABNORMAL LOW (ref 12.0–15.0)
Lymphocytes Relative: 55 % — ABNORMAL HIGH (ref 12–46)
Lymphs Abs: 2.5 10*3/uL (ref 0.7–4.0)
MCH: 33.5 pg (ref 26.0–34.0)
MCHC: 34 g/dL (ref 30.0–36.0)
MCV: 98.6 fL (ref 78.0–100.0)
Monocytes Absolute: 0.5 10*3/uL (ref 0.1–1.0)
Monocytes Relative: 10 % (ref 3–12)
Neutro Abs: 1.3 10*3/uL — ABNORMAL LOW (ref 1.7–7.7)
Neutrophils Relative %: 30 % — ABNORMAL LOW (ref 43–77)
Platelets: 135 10*3/uL — ABNORMAL LOW (ref 150–400)
RBC: 3.49 MIL/uL — ABNORMAL LOW (ref 3.87–5.11)
RDW: 15.4 % (ref 11.5–15.5)
WBC: 4.5 10*3/uL (ref 4.0–10.5)

## 2012-05-26 LAB — COMPLETE METABOLIC PANEL WITH GFR
ALT: 12 U/L (ref 0–35)
AST: 25 U/L (ref 0–37)
Albumin: 3.9 g/dL (ref 3.5–5.2)
Alkaline Phosphatase: 258 U/L — ABNORMAL HIGH (ref 39–117)
BUN: 49 mg/dL — ABNORMAL HIGH (ref 6–23)
CO2: 25 mEq/L (ref 19–32)
Calcium: 9.8 mg/dL (ref 8.4–10.5)
Chloride: 97 mEq/L (ref 96–112)
Creat: 6.68 mg/dL — ABNORMAL HIGH (ref 0.50–1.10)
GFR, Est African American: 7 mL/min — ABNORMAL LOW
GFR, Est Non African American: 6 mL/min — ABNORMAL LOW
Glucose, Bld: 78 mg/dL (ref 70–99)
Potassium: 5.1 mEq/L (ref 3.5–5.3)
Sodium: 132 mEq/L — ABNORMAL LOW (ref 135–145)
Total Bilirubin: 0.5 mg/dL (ref 0.3–1.2)
Total Protein: 9 g/dL — ABNORMAL HIGH (ref 6.0–8.3)

## 2012-05-27 LAB — T-HELPER CELL (CD4) - (RCID CLINIC ONLY)
CD4 % Helper T Cell: 14 % — ABNORMAL LOW (ref 33–55)
CD4 T Cell Abs: 330 uL — ABNORMAL LOW (ref 400–2700)

## 2012-05-27 LAB — HIV-1 RNA QUANT-NO REFLEX-BLD
HIV 1 RNA Quant: <20 copies/mL (ref <20)
HIV-1 RNA Quant, Log: <1.3 {Log} (ref <1.30)

## 2012-06-07 ENCOUNTER — Telehealth: Payer: Self-pay | Admitting: *Deleted

## 2012-06-07 ENCOUNTER — Ambulatory Visit: Payer: Medicaid Other | Admitting: Infectious Disease

## 2012-06-07 NOTE — Telephone Encounter (Signed)
Per RN at [facility], the nursing facility needs to reschedule the appointment, as there were issues with transporting patients to their appointments today.  Their transportation coordinator will be calling to reschedule this appointment. Landis Gandy, RN

## 2012-06-11 ENCOUNTER — Encounter: Payer: Self-pay | Admitting: Infectious Disease

## 2012-06-11 ENCOUNTER — Ambulatory Visit (INDEPENDENT_AMBULATORY_CARE_PROVIDER_SITE_OTHER): Payer: Medicaid Other | Admitting: Infectious Disease

## 2012-06-11 VITALS — BP 95/58 | HR 71 | Temp 97.5°F | Wt 172.0 lb

## 2012-06-11 DIAGNOSIS — Z5189 Encounter for other specified aftercare: Secondary | ICD-10-CM

## 2012-06-11 DIAGNOSIS — T827XXD Infection and inflammatory reaction due to other cardiac and vascular devices, implants and grafts, subsequent encounter: Secondary | ICD-10-CM

## 2012-06-11 DIAGNOSIS — B20 Human immunodeficiency virus [HIV] disease: Secondary | ICD-10-CM

## 2012-06-11 DIAGNOSIS — Z992 Dependence on renal dialysis: Secondary | ICD-10-CM

## 2012-06-11 DIAGNOSIS — E785 Hyperlipidemia, unspecified: Secondary | ICD-10-CM

## 2012-06-11 DIAGNOSIS — Z113 Encounter for screening for infections with a predominantly sexual mode of transmission: Secondary | ICD-10-CM

## 2012-06-11 DIAGNOSIS — N186 End stage renal disease: Secondary | ICD-10-CM

## 2012-06-11 DIAGNOSIS — R7881 Bacteremia: Secondary | ICD-10-CM

## 2012-06-11 NOTE — Progress Notes (Signed)
Subjective:    Patient ID: [name], female    DOB: [birth year], 56 y.o.   MRN: [id number]  HPI   57 year old [name] with HIV currently perfectly controlled and ESRD on HD with yet another episode of MRSA bacteremia in February. She was admitted to my service and underwent exploration and revision of her graft by VVS with concern for infection though no obvious infection was found in the graft. She had TEE which failed to show any evidence of vegetation. He cleared her bacteremia by the 7th of February= date of her graft revisions as well and has  Competed a course of  IV vancomycin with.   She is residing in [facility].  She has now had new graft placed in left leg and continuing with HD via HD catheter a right chest.   She is doing well with no fevers, chills or malaise. She has no pain in any joints and no back pain. She denies diarrhea. She is adherent with ARV given in SNF.  Her viral load is undetectable and CD4 count is healthy above 300.    Review of Systems  Constitutional: Negative for fever, chills, diaphoresis, activity change, appetite change, fatigue and unexpected weight change.  HENT: Negative for congestion, sore throat, rhinorrhea, sneezing, trouble swallowing and sinus pressure.   Eyes: Negative for photophobia and visual disturbance.  Respiratory: Negative for cough, chest tightness, shortness of breath, wheezing and stridor.   Cardiovascular: Negative for chest pain, palpitations and leg swelling.  Gastrointestinal: Negative for nausea, vomiting, abdominal pain, diarrhea, constipation, blood in stool, abdominal distention and anal bleeding.  Genitourinary: Negative for dysuria, hematuria, flank pain and difficulty urinating.  Musculoskeletal: Negative for myalgias, back pain, joint swelling, arthralgias and gait problem.  Skin: Positive for wound. Negative for color change, pallor and rash.  Neurological: Negative for dizziness, tremors,  weakness and light-headedness.  Hematological: Negative for adenopathy. Does not bruise/bleed easily.  Psychiatric/Behavioral: Negative for behavioral problems, confusion, sleep disturbance, dysphoric mood, decreased concentration and agitation.       Objective:   Physical Exam  Constitutional: She is oriented to person, place, and time. No distress.  HENT:  Head: Normocephalic and atraumatic.  Mouth/Throat: Oropharynx is clear and moist. No oropharyngeal exudate.  Eyes: Conjunctivae and EOM are normal. Pupils are equal, round, and reactive to light. No scleral icterus.  Neck: Normal range of motion. Neck supple. No JVD present.  Cardiovascular: Normal rate, regular rhythm and normal heart sounds.  Exam reveals no gallop and no friction rub.   No murmur heard. Pulmonary/Chest: Effort normal and breath sounds normal. No respiratory distress. She has no wheezes. She has no rales. She exhibits no tenderness.  Abdominal: She exhibits no distension and no mass. There is no tenderness.  Musculoskeletal: She exhibits no edema and no tenderness.  Lymphadenopathy:    She has no cervical adenopathy.  Neurological: She is alert and oriented to person, place, and time. She has normal reflexes. She exhibits normal muscle tone. Coordination normal.  Skin: Skin is warm. She is not diaphoretic. No erythema. No pallor.     Psychiatric: She has a normal mood and affect. Her behavior is normal. Judgment and thought content normal.          Assessment & Plan:   MRSA bacteremia she should have received  vancomycin IV for 4-6 weeks post graft removal not currently on abx   HIV: perfectly controlled, recheck labs when she comes back for  return visit  Hx of C difficile colitis: watch for recurrence

## 2012-06-21 ENCOUNTER — Non-Acute Institutional Stay (SKILLED_NURSING_FACILITY): Payer: Medicaid Other | Admitting: Internal Medicine

## 2012-06-21 DIAGNOSIS — B20 Human immunodeficiency virus [HIV] disease: Secondary | ICD-10-CM

## 2012-06-21 DIAGNOSIS — I951 Orthostatic hypotension: Secondary | ICD-10-CM

## 2012-06-21 DIAGNOSIS — D631 Anemia in chronic kidney disease: Secondary | ICD-10-CM

## 2012-06-21 DIAGNOSIS — N186 End stage renal disease: Secondary | ICD-10-CM

## 2012-06-22 DIAGNOSIS — I951 Orthostatic hypotension: Secondary | ICD-10-CM | POA: Insufficient documentation

## 2012-06-22 DIAGNOSIS — D631 Anemia in chronic kidney disease: Secondary | ICD-10-CM | POA: Insufficient documentation

## 2012-06-22 NOTE — Progress Notes (Signed)
PROGRESS NOTE  DATE: 06-21-12  FACILITY: [facility]  LEVEL OF CARE: SNF  Routine Visit  CHIEF COMPLAINT:  Manage anemia of chronic kidney disease and orthostatic hypotension  HISTORY OF PRESENT ILLNESS:  REASSESSMENT OF ONGOING PROBLEM(S): 1. ANEMIA: The anemia has been stable. The patient denies fatigue, melena or hematochezia. No complications from the medications currently being used. Anemia secondary to chronic kidney disease. In 2/14 hemoglobin 10.5, MCV 108.  2.ORTHOSTATIC HYPOTENSION: The orthostatcic hypotension remains stable.  Pt is tolerating current medications without any adverse reactions.  No dizziness or lightheadedness reported.  PAST MEDICAL HISTORY : Reviewed.  No changes.  CURRENT MEDICATIONS: Reviewed per [facility]  REVIEW OF SYSTEMS:  GENERAL: no change in appetite, no fatigue, no weight changes, no fever, chills or weakness RESPIRATORY: no cough, SOB, DOE, wheezing, hemoptysis CARDIAC: no chest pain, or palpitations, complains of bilateral lower extremity swelling GI: no abdominal pain, diarrhea, constipation, heart burn, nausea or vomiting  PHYSICAL EXAMINATION  VS:  T 96.9       P 72      RR 21      BP 121/69     POX %     WT (Lb) 169  GENERAL: no acute distress, normal body habitus NECK: supple, trachea midline, no neck masses, no thyroid tenderness, no thyromegaly RESPIRATORY: breathing is even & unlabored, BS CTAB CARDIAC: RRR, no murmur,no extra heart sounds, left lower extremity has +3 edema and right lower extremity has +2 edema GI: abdomen soft, normal BS, no masses, no tenderness, no hepatomegaly, no splenomegaly PSYCHIATRIC: the patient is alert & oriented to person, affect & behavior appropriate  LABS/RADIOLOGY: 2/14 hemoglobin 10.5, MCV 108 otherwise CBC normal, BUN 25, creatinine 5.3, total protein 8.9 otherwise CMP normal, CD4 330, RPR nonreactive   ASSESSMENT/PLAN: 1. anemia of chronic kidney disease-stable. 2. orthostatic  hypotension-continue midodrine. 3. AIDS-continue current medications. Managed by infectious disease. 4. end-stage renal disease-on hemodialysis. 5. hyperphosphatemia-on PhosLo.  CPT CODE: 13086

## 2012-07-19 ENCOUNTER — Non-Acute Institutional Stay (SKILLED_NURSING_FACILITY): Payer: Medicaid Other | Admitting: Internal Medicine

## 2012-07-19 DIAGNOSIS — I951 Orthostatic hypotension: Secondary | ICD-10-CM

## 2012-07-19 DIAGNOSIS — D638 Anemia in other chronic diseases classified elsewhere: Secondary | ICD-10-CM

## 2012-07-19 DIAGNOSIS — B20 Human immunodeficiency virus [HIV] disease: Secondary | ICD-10-CM

## 2012-07-19 DIAGNOSIS — N186 End stage renal disease: Secondary | ICD-10-CM

## 2012-07-19 NOTE — Progress Notes (Signed)
PROGRESS NOTE  DATE: 07-19-12  FACILITY: [facility]  LEVEL OF CARE: SNF  Routine Visit  CHIEF COMPLAINT:  Manage anemia of chronic kidney disease and orthostatic hypotension  HISTORY OF PRESENT ILLNESS:  REASSESSMENT OF ONGOING PROBLEM(S):  ANEMIA: The anemia has been stable. The patient denies fatigue, melena or hematochezia. No complications from the medications currently being used. Anemia secondary to chronic kidney disease. In 2/14 hemoglobin 10.5, MCV 108.  ORTHOSTATIC HYPOTENSION: The orthostatcic hypotension remains stable.  Pt is tolerating current medications without any adverse reactions.  No dizziness or lightheadedness reported.  PAST MEDICAL HISTORY : Reviewed.  No changes.  CURRENT MEDICATIONS: Reviewed per [facility]  REVIEW OF SYSTEMS:  GENERAL: no change in appetite, no fatigue, no weight changes, no fever, chills or weakness RESPIRATORY: no cough, SOB, DOE, wheezing, hemoptysis CARDIAC: no chest pain, or palpitations, complains of bilateral lower extremity swelling GI: no abdominal pain, diarrhea, constipation, heart burn, nausea or vomiting  PHYSICAL EXAMINATION  VS:  T 96.8       P 72      RR 20      BP 88/50     POX %     WT (Lb) 170  GENERAL: no acute distress, obese body habitus NECK: supple, trachea midline, no neck masses, no thyroid tenderness, no thyromegaly RESPIRATORY: breathing is even & unlabored, BS CTAB CARDIAC: RRR, no murmur,no extra heart sounds, left lower extremity has +3 edema and right lower extremity has +2 edema GI: abdomen soft, normal BS, no masses, no tenderness, no hepatomegaly, no splenomegaly PSYCHIATRIC: the patient is alert & oriented to person, affect & behavior appropriate  LABS/RADIOLOGY: 2/14 hemoglobin 10.5, MCV 108 otherwise CBC normal, BUN 25, creatinine 5.3, total protein 8.9 otherwise CMP normal, CD4 330, RPR nonreactive   ASSESSMENT/PLAN: 1. anemia of chronic kidney disease-stable. 2. orthostatic  hypotension-continue midodrine. 3. AIDS-continue current medications. Managed by infectious disease. 4. end-stage renal disease-on hemodialysis. 5. hyperphosphatemia-on PhosLo.  CPT CODE: 65784

## 2012-08-23 ENCOUNTER — Non-Acute Institutional Stay (SKILLED_NURSING_FACILITY): Payer: Medicaid Other | Admitting: Internal Medicine

## 2012-08-23 DIAGNOSIS — I951 Orthostatic hypotension: Secondary | ICD-10-CM

## 2012-08-23 DIAGNOSIS — D631 Anemia in chronic kidney disease: Secondary | ICD-10-CM

## 2012-08-23 DIAGNOSIS — B20 Human immunodeficiency virus [HIV] disease: Secondary | ICD-10-CM

## 2012-08-23 DIAGNOSIS — N186 End stage renal disease: Secondary | ICD-10-CM

## 2012-08-28 NOTE — Progress Notes (Signed)
PROGRESS NOTE  DATE: 08-23-12  FACILITY: [facility]  LEVEL OF CARE: SNF  Routine Visit  CHIEF COMPLAINT:  Manage anemia of chronic kidney disease and orthostatic hypotension  HISTORY OF PRESENT ILLNESS:  REASSESSMENT OF ONGOING PROBLEM(S):  ANEMIA: The anemia has been stable. The patient denies fatigue, melena or hematochezia. No complications from the medications currently being used. Anemia secondary to chronic kidney disease. In 2/14 hemoglobin 10.5, MCV 108.  ORTHOSTATIC HYPOTENSION: The orthostatcic hypotension remains stable.  Pt is tolerating current medications without any adverse reactions.  No dizziness or lightheadedness reported.  PAST MEDICAL HISTORY : Reviewed.  No changes.  CURRENT MEDICATIONS: Reviewed per [facility]  REVIEW OF SYSTEMS:  GENERAL: no change in appetite, no fatigue, no weight changes, no fever, chills or weakness RESPIRATORY: no cough, SOB, DOE, wheezing, hemoptysis CARDIAC: no chest pain, or palpitations, complains of bilateral lower extremity swelling GI: no abdominal pain, diarrhea, constipation, heart burn, nausea or vomiting  PHYSICAL EXAMINATION  VS:  T 96.2      P 66     RR 20      BP 110/76     POX %     WT (Lb) 175  GENERAL: no acute distress, obese body habitus NECK: supple, trachea midline, no neck masses, no thyroid tenderness, no thyromegaly RESPIRATORY: breathing is even & unlabored, BS CTAB CARDIAC: RRR, no murmur,no extra heart sounds, left lower extremity has +3 edema and right lower extremity has +2 edema GI: abdomen soft, normal BS, no masses, no tenderness, no hepatomegaly, no splenomegaly PSYCHIATRIC: the patient is alert & oriented to person, affect & behavior appropriate  LABS/RADIOLOGY: 2/14 hemoglobin 10.5, MCV 108 otherwise CBC normal, BUN 25, creatinine 5.3, total protein 8.9 otherwise CMP normal, CD4 330, RPR nonreactive   ASSESSMENT/PLAN:  anemia of chronic kidney disease-stable. Re- check orthostatic  hypotension-continue midodrine. AIDS-continue current medications. Managed by infectious disease. end-stage renal disease-on hemodialysis. hyperphosphatemia-on PhosLo. Check CBC and CMP  CPT CODE: 96295

## 2012-09-02 ENCOUNTER — Other Ambulatory Visit: Payer: Medicaid Other

## 2012-09-13 ENCOUNTER — Encounter: Payer: Self-pay | Admitting: Infectious Disease

## 2012-09-13 ENCOUNTER — Ambulatory Visit (INDEPENDENT_AMBULATORY_CARE_PROVIDER_SITE_OTHER): Payer: Medicaid Other | Admitting: Infectious Disease

## 2012-09-13 VITALS — BP 97/64 | HR 76 | Temp 98.4°F | Wt 196.0 lb

## 2012-09-13 DIAGNOSIS — B20 Human immunodeficiency virus [HIV] disease: Secondary | ICD-10-CM

## 2012-09-13 DIAGNOSIS — A0472 Enterocolitis due to Clostridium difficile, not specified as recurrent: Secondary | ICD-10-CM

## 2012-09-13 DIAGNOSIS — T827XXS Infection and inflammatory reaction due to other cardiac and vascular devices, implants and grafts, sequela: Secondary | ICD-10-CM

## 2012-09-13 DIAGNOSIS — R7881 Bacteremia: Secondary | ICD-10-CM

## 2012-09-13 DIAGNOSIS — A4902 Methicillin resistant Staphylococcus aureus infection, unspecified site: Secondary | ICD-10-CM

## 2012-09-13 DIAGNOSIS — T889XXS Complication of surgical and medical care, unspecified, sequela: Secondary | ICD-10-CM

## 2012-09-13 DIAGNOSIS — Z23 Encounter for immunization: Secondary | ICD-10-CM

## 2012-09-13 LAB — CBC WITH DIFFERENTIAL/PLATELET
Basophils Absolute: 0 10*3/uL (ref 0.0–0.1)
Basophils Relative: 0 % (ref 0–1)
Eosinophils Absolute: 0.1 10*3/uL (ref 0.0–0.7)
Eosinophils Relative: 3 % (ref 0–5)
HCT: 34.8 % — ABNORMAL LOW (ref 36.0–46.0)
Hemoglobin: 11.6 g/dL — ABNORMAL LOW (ref 12.0–15.0)
Lymphocytes Relative: 44 % (ref 12–46)
Lymphs Abs: 1.8 10*3/uL (ref 0.7–4.0)
MCH: 35.3 pg — ABNORMAL HIGH (ref 26.0–34.0)
MCHC: 33.3 g/dL (ref 30.0–36.0)
MCV: 105.8 fL — ABNORMAL HIGH (ref 78.0–100.0)
Monocytes Absolute: 0.4 10*3/uL (ref 0.1–1.0)
Monocytes Relative: 10 % (ref 3–12)
Neutro Abs: 1.8 10*3/uL (ref 1.7–7.7)
Neutrophils Relative %: 43 % (ref 43–77)
Platelets: 170 10*3/uL (ref 150–400)
RBC: 3.29 MIL/uL — ABNORMAL LOW (ref 3.87–5.11)
RDW: 14.5 % (ref 11.5–15.5)
WBC: 4.1 10*3/uL (ref 4.0–10.5)

## 2012-09-13 NOTE — Progress Notes (Signed)
Subjective:    Patient ID: [name], female    DOB: [birth year], 56 y.o.   MRN: [id number]  HPI   57 year old [name] with HIV currently perfectly controlled and ESRD on HD with yet another episode of MRSA bacteremia in February. She was admitted to my service and underwent exploration and revision of her graft by VVS with concern for infection though no obvious infection was found in the graft. She had TEE which failed to show any evidence of vegetation. He cleared her bacteremia by the 7th of February= date of her graft revisions as well and has  Competed a course of  IV vancomycin with.   She is still  residing in [facility].  She has now had new graft placed in left leg and continuing with HD via HD catheter a right chest.   She is doing well with no fevers, chills or malaise. She has no pain in any joints and no back pain. She denies diarrhea. She is adherent with ARV given in SNF.  Her viral load is undetectable and CD4 count is healthy above 300 when last checked. She appears to have had some low blood pressures after dialysis is currently on the drain.    Review of Systems  Constitutional: Negative for fever, chills, diaphoresis, activity change, appetite change, fatigue and unexpected weight change.  HENT: Negative for congestion, sore throat, rhinorrhea, sneezing, trouble swallowing and sinus pressure.   Eyes: Negative for photophobia and visual disturbance.  Respiratory: Negative for cough, chest tightness, shortness of breath, wheezing and stridor.   Cardiovascular: Negative for chest pain, palpitations and leg swelling.  Gastrointestinal: Negative for nausea, vomiting, abdominal pain, diarrhea, constipation, blood in stool, abdominal distention and anal bleeding.  Genitourinary: Negative for dysuria, hematuria, flank pain and difficulty urinating.  Musculoskeletal: Negative for myalgias, back pain, joint swelling, arthralgias and gait problem.    Skin: Positive for wound. Negative for color change, pallor and rash.  Neurological: Negative for dizziness, tremors, weakness and light-headedness.  Hematological: Negative for adenopathy. Does not bruise/bleed easily.  Psychiatric/Behavioral: Negative for behavioral problems, confusion, sleep disturbance, dysphoric mood, decreased concentration and agitation.       Objective:   Physical Exam  Constitutional: She is oriented to person, place, and time. No distress.  HENT:  Head: Normocephalic and atraumatic.  Mouth/Throat: Oropharynx is clear and moist. No oropharyngeal exudate.  Eyes: Conjunctivae and EOM are normal. Pupils are equal, round, and reactive to light. No scleral icterus.  Neck: Normal range of motion. Neck supple. No JVD present.  Cardiovascular: Normal rate, regular rhythm and normal heart sounds.  Exam reveals no gallop and no friction rub.   No murmur heard. Pulmonary/Chest: Effort normal and breath sounds normal. No respiratory distress. She has no wheezes. She has no rales. She exhibits no tenderness.  Abdominal: She exhibits no distension and no mass. There is no tenderness.  Musculoskeletal: She exhibits no edema and no tenderness.  Lymphadenopathy:    She has no cervical adenopathy.  Neurological: She is alert and oriented to person, place, and time. She has normal reflexes. She exhibits normal muscle tone. Coordination normal.  Skin: Skin is warm. She is not diaphoretic. No erythema. No pallor.     Psychiatric: She has a normal mood and affect. Her behavior is normal. Judgment and thought content normal.          Assessment & Plan:   MRSA bacteremiareceived  vancomycin IV for 4-6 weeks post  graft removal not currently on abx and doing well.   HIV: perfectly controlled, recheck labs today.  Hx of C difficile colitis: watch for recurrence, none so far.  Hypotension with HD: on midodrine.

## 2012-09-14 LAB — T-HELPER CELL (CD4) - (RCID CLINIC ONLY)
CD4 % Helper T Cell: 18 % — ABNORMAL LOW (ref 33–55)
CD4 T Cell Abs: 320 /uL — ABNORMAL LOW (ref 400–2700)

## 2012-09-14 LAB — HIV-1 RNA QUANT-NO REFLEX-BLD
HIV 1 RNA Quant: <20 copies/mL (ref <20)
HIV-1 RNA Quant, Log: <1.3 {Log} (ref <1.30)

## 2012-09-20 ENCOUNTER — Non-Acute Institutional Stay (SKILLED_NURSING_FACILITY): Payer: Medicaid Other | Admitting: Internal Medicine

## 2012-09-20 DIAGNOSIS — N186 End stage renal disease: Secondary | ICD-10-CM

## 2012-09-20 DIAGNOSIS — I951 Orthostatic hypotension: Secondary | ICD-10-CM

## 2012-09-20 DIAGNOSIS — B20 Human immunodeficiency virus [HIV] disease: Secondary | ICD-10-CM

## 2012-09-20 DIAGNOSIS — D631 Anemia in chronic kidney disease: Secondary | ICD-10-CM

## 2012-09-20 NOTE — Progress Notes (Signed)
PROGRESS NOTE  DATE: 09-20-12  FACILITY: [facility]  LEVEL OF CARE: SNF  Routine Visit  CHIEF COMPLAINT:  Manage anemia of chronic kidney disease and orthostatic hypotension  HISTORY OF PRESENT ILLNESS:  REASSESSMENT OF ONGOING PROBLEM(S):  ANEMIA: The anemia has been stable. The patient denies fatigue, melena or hematochezia. No complications from the medications currently being used. Anemia secondary to chronic kidney disease. In 2/14 hemoglobin 10.5, MCV 108, in 8-14 hemoglobin 12.9, MCV 111.  ORTHOSTATIC HYPOTENSION: The orthostatcic hypotension remains stable.  Pt is tolerating current medications without any adverse reactions.  No dizziness or lightheadedness reported.  PAST MEDICAL HISTORY : Reviewed.  No changes.  CURRENT MEDICATIONS: Reviewed per [facility]  REVIEW OF SYSTEMS:  GENERAL: no change in appetite, no fatigue, no weight changes, no fever, chills or weakness RESPIRATORY: no cough, SOB, DOE, wheezing, hemoptysis CARDIAC: no chest pain, or palpitations, complains of bilateral lower extremity swelling GI: no abdominal pain, diarrhea, constipation, heart burn, nausea or vomiting  PHYSICAL EXAMINATION  VS:  T 97.3     P 68     RR 20      BP 90/50     POX %     WT (Lb) 180  GENERAL: no acute distress, obese body habitus NECK: supple, trachea midline, no neck masses, no thyroid tenderness, no thyromegaly RESPIRATORY: breathing is even & unlabored, BS CTAB CARDIAC: RRR, no murmur,no extra heart sounds, left lower extremity has +3 edema and right lower extremity has +2 edema GI: abdomen soft, normal BS, no masses, no tenderness, no hepatomegaly, no splenomegaly PSYCHIATRIC: the patient is alert & oriented to person, affect & behavior appropriate  LABS/RADIOLOGY:  8-14 MCV 111 otherwise CBC normal, BUN 45, creatinine 6, alkaline phosphatase 395, total protein 9 otherwise CMP normal, vitamin B12 level 1308, foloic acid level  greater than 19.9 2/14 hemoglobin 10.5,  MCV 108 otherwise CBC normal, BUN 25, creatinine 5.3, total protein 8.9 otherwise CMP normal, CD4 330, RPR nonreactive   ASSESSMENT/PLAN:  anemia of chronic kidney disease-hemoglobin normalized orthostatic hypotension-continue midodrine. AIDS-continue current medications. Managed by infectious disease. end-stage renal disease-on hemodialysis. hyperphosphatemia-on PhosLo.  CPT CODE: 09811

## 2012-10-11 ENCOUNTER — Non-Acute Institutional Stay (SKILLED_NURSING_FACILITY): Payer: Medicaid Other | Admitting: Internal Medicine

## 2012-10-11 DIAGNOSIS — B20 Human immunodeficiency virus [HIV] disease: Secondary | ICD-10-CM

## 2012-10-11 DIAGNOSIS — I951 Orthostatic hypotension: Secondary | ICD-10-CM

## 2012-10-11 DIAGNOSIS — N186 End stage renal disease: Secondary | ICD-10-CM

## 2012-10-15 ENCOUNTER — Ambulatory Visit: Payer: Self-pay | Admitting: Internal Medicine

## 2012-10-15 ENCOUNTER — Encounter: Payer: Self-pay | Admitting: Internal Medicine

## 2012-10-16 NOTE — Progress Notes (Signed)
PROGRESS NOTE  DATE: 10-11-12  FACILITY: [facility]  LEVEL OF CARE: SNF  Routine Visit  CHIEF COMPLAINT:  Manage AIDS and orthostatic hypotension  HISTORY OF PRESENT ILLNESS:  REASSESSMENT OF ONGOING PROBLEM(S):  AIDS:  Patient denies any acute symptoms. She is tolerating her medications without any side effects. She is followed by infectious disease.  ORTHOSTATIC HYPOTENSION: The orthostatcic hypotension remains stable.  Pt is tolerating current medications without any adverse reactions.  No dizziness or lightheadedness reported.  PAST MEDICAL HISTORY : Reviewed.  No changes.  CURRENT MEDICATIONS: Reviewed per [facility]  REVIEW OF SYSTEMS:  GENERAL: no change in appetite, no fatigue, no weight changes, no fever, chills or weakness RESPIRATORY: no cough, SOB, DOE, wheezing, hemoptysis CARDIAC: no chest pain, or palpitations, complains of bilateral lower extremity swelling GI: no abdominal pain, diarrhea, constipation, heart burn, nausea or vomiting  PHYSICAL EXAMINATION  VS:  T 98.6     P 70    RR 20      BP 100/68    POX %     WT (Lb) 180  GENERAL: no acute distress, obese body habitus NECK: supple, trachea midline, no neck masses, no thyroid tenderness, no thyromegaly RESPIRATORY: breathing is even & unlabored, BS CTAB CARDIAC: RRR, no murmur,no extra heart sounds, left lower extremity has +3 edema and right lower extremity has +2 edema GI: abdomen soft, normal BS, no masses, no tenderness, no hepatomegaly, no splenomegaly PSYCHIATRIC: the patient is alert & oriented to person, affect & behavior appropriate  LABS/RADIOLOGY:  8-14 MCV 111 otherwise CBC normal, BUN 45, creatinine 6, alkaline phosphatase 395, total protein 9 otherwise CMP normal, vitamin B12 level 1308, foloic acid level  greater than 19.9 2/14 hemoglobin 10.5, MCV 108 otherwise CBC normal, BUN 25, creatinine 5.3, total protein 8.9 otherwise CMP normal, CD4 330, RPR nonreactive    ASSESSMENT/PLAN:  orthostatic hypotension-continue midodrine. AIDS-continue current medications. Managed by infectious disease. end-stage renal disease-on hemodialysis. hyperphosphatemia-on PhosLo.  CPT CODE: 28413

## 2012-11-11 ENCOUNTER — Inpatient Hospital Stay (HOSPITAL_COMMUNITY)
Admission: EM | Admit: 2012-11-11 | Discharge: 2012-11-18 | DRG: 977 | Disposition: A | Discharge status: Skilled Nursing Facility | Payer: Medicaid Other | Attending: Internal Medicine | Admitting: Internal Medicine

## 2012-11-11 ENCOUNTER — Encounter (HOSPITAL_COMMUNITY): Payer: Self-pay | Admitting: Emergency Medicine

## 2012-11-11 ENCOUNTER — Emergency Department (HOSPITAL_COMMUNITY): Payer: Medicaid Other

## 2012-11-11 DIAGNOSIS — K810 Acute cholecystitis: Secondary | ICD-10-CM

## 2012-11-11 DIAGNOSIS — R5081 Fever presenting with conditions classified elsewhere: Secondary | ICD-10-CM

## 2012-11-11 DIAGNOSIS — R7881 Bacteremia: Secondary | ICD-10-CM

## 2012-11-11 DIAGNOSIS — B20 Human immunodeficiency virus [HIV] disease: Principal | ICD-10-CM | POA: Diagnosis present

## 2012-11-11 DIAGNOSIS — D638 Anemia in other chronic diseases classified elsewhere: Secondary | ICD-10-CM | POA: Diagnosis present

## 2012-11-11 DIAGNOSIS — Z6830 Body mass index (BMI) 30.0-30.9, adult: Secondary | ICD-10-CM

## 2012-11-11 DIAGNOSIS — I428 Other cardiomyopathies: Secondary | ICD-10-CM | POA: Diagnosis present

## 2012-11-11 DIAGNOSIS — Z7982 Long term (current) use of aspirin: Secondary | ICD-10-CM

## 2012-11-11 DIAGNOSIS — I38 Endocarditis, valve unspecified: Secondary | ICD-10-CM

## 2012-11-11 DIAGNOSIS — R609 Edema, unspecified: Secondary | ICD-10-CM

## 2012-11-11 DIAGNOSIS — I959 Hypotension, unspecified: Secondary | ICD-10-CM | POA: Diagnosis present

## 2012-11-11 DIAGNOSIS — I120 Hypertensive chronic kidney disease with stage 5 chronic kidney disease or end stage renal disease: Secondary | ICD-10-CM | POA: Diagnosis present

## 2012-11-11 DIAGNOSIS — Z8679 Personal history of other diseases of the circulatory system: Secondary | ICD-10-CM

## 2012-11-11 DIAGNOSIS — D472 Monoclonal gammopathy: Secondary | ICD-10-CM | POA: Diagnosis present

## 2012-11-11 DIAGNOSIS — R7401 Elevation of levels of liver transaminase levels: Secondary | ICD-10-CM | POA: Diagnosis present

## 2012-11-11 DIAGNOSIS — N2581 Secondary hyperparathyroidism of renal origin: Secondary | ICD-10-CM | POA: Diagnosis present

## 2012-11-11 DIAGNOSIS — D709 Neutropenia, unspecified: Secondary | ICD-10-CM | POA: Diagnosis present

## 2012-11-11 DIAGNOSIS — N186 End stage renal disease: Secondary | ICD-10-CM | POA: Diagnosis present

## 2012-11-11 DIAGNOSIS — I739 Peripheral vascular disease, unspecified: Secondary | ICD-10-CM | POA: Diagnosis present

## 2012-11-11 DIAGNOSIS — A0471 Enterocolitis due to Clostridium difficile, recurrent: Secondary | ICD-10-CM

## 2012-11-11 DIAGNOSIS — Z21 Asymptomatic human immunodeficiency virus [HIV] infection status: Secondary | ICD-10-CM

## 2012-11-11 DIAGNOSIS — R011 Cardiac murmur, unspecified: Secondary | ICD-10-CM | POA: Diagnosis present

## 2012-11-11 DIAGNOSIS — Z880 Allergy status to penicillin: Secondary | ICD-10-CM

## 2012-11-11 DIAGNOSIS — I5022 Chronic systolic (congestive) heart failure: Secondary | ICD-10-CM | POA: Diagnosis present

## 2012-11-11 DIAGNOSIS — B9562 Methicillin resistant Staphylococcus aureus infection as the cause of diseases classified elsewhere: Secondary | ICD-10-CM

## 2012-11-11 DIAGNOSIS — D61818 Other pancytopenia: Secondary | ICD-10-CM | POA: Diagnosis present

## 2012-11-11 DIAGNOSIS — Z992 Dependence on renal dialysis: Secondary | ICD-10-CM

## 2012-11-11 DIAGNOSIS — K7200 Acute and subacute hepatic failure without coma: Secondary | ICD-10-CM | POA: Diagnosis present

## 2012-11-11 DIAGNOSIS — D631 Anemia in chronic kidney disease: Secondary | ICD-10-CM

## 2012-11-11 DIAGNOSIS — E46 Unspecified protein-calorie malnutrition: Secondary | ICD-10-CM

## 2012-11-11 DIAGNOSIS — K811 Chronic cholecystitis: Secondary | ICD-10-CM | POA: Diagnosis present

## 2012-11-11 DIAGNOSIS — I951 Orthostatic hypotension: Secondary | ICD-10-CM

## 2012-11-11 DIAGNOSIS — R509 Fever, unspecified: Secondary | ICD-10-CM | POA: Diagnosis present

## 2012-11-11 LAB — CBC WITH DIFFERENTIAL/PLATELET
Band Neutrophils: 0 % (ref 0–10)
Basophils Absolute: 0.1 10*3/uL (ref 0.0–0.1)
Basophils Relative: 2 % — ABNORMAL HIGH (ref 0–1)
Blasts: 0 %
Eosinophils Absolute: 0 10*3/uL (ref 0.0–0.7)
Eosinophils Relative: 1 % (ref 0–5)
HCT: 36 % (ref 36.0–46.0)
Hemoglobin: 12 g/dL (ref 12.0–15.0)
Lymphocytes Relative: 49 % — ABNORMAL HIGH (ref 12–46)
Lymphs Abs: 1.4 10*3/uL (ref 0.7–4.0)
MCH: 36.5 pg — ABNORMAL HIGH (ref 26.0–34.0)
MCHC: 33.3 g/dL (ref 30.0–36.0)
MCV: 109.4 fL — ABNORMAL HIGH (ref 78.0–100.0)
Metamyelocytes Relative: 0 %
Monocytes Absolute: 0.5 10*3/uL (ref 0.1–1.0)
Monocytes Relative: 16 % — ABNORMAL HIGH (ref 3–12)
Myelocytes: 0 %
Neutro Abs: 0.9 10*3/uL — ABNORMAL LOW (ref 1.7–7.7)
Neutrophils Relative %: 32 % — ABNORMAL LOW (ref 43–77)
Platelets: 139 10*3/uL — ABNORMAL LOW (ref 150–400)
Promyelocytes Absolute: 0 %
RBC: 3.29 MIL/uL — ABNORMAL LOW (ref 3.87–5.11)
RDW: 12.7 % (ref 11.5–15.5)
WBC: 2.9 10*3/uL — ABNORMAL LOW (ref 4.0–10.5)
nRBC: 0 /100 WBC

## 2012-11-11 LAB — COMPREHENSIVE METABOLIC PANEL
ALT: 215 U/L — ABNORMAL HIGH (ref 0–35)
AST: 293 U/L — ABNORMAL HIGH (ref 0–37)
Albumin: 3.8 g/dL (ref 3.5–5.2)
Alkaline Phosphatase: 259 U/L — ABNORMAL HIGH (ref 39–117)
BUN: 23 mg/dL (ref 6–23)
CO2: 28 mEq/L (ref 19–32)
Calcium: 10.3 mg/dL (ref 8.4–10.5)
Chloride: 92 mEq/L — ABNORMAL LOW (ref 96–112)
Creatinine, Ser: 5.15 mg/dL — ABNORMAL HIGH (ref 0.50–1.10)
GFR calc Af Amer: 10 mL/min — ABNORMAL LOW (ref >90)
GFR calc non Af Amer: 9 mL/min — ABNORMAL LOW (ref >90)
Glucose, Bld: 92 mg/dL (ref 70–99)
Potassium: 4.3 mEq/L (ref 3.5–5.1)
Sodium: 133 mEq/L — ABNORMAL LOW (ref 135–145)
Total Bilirubin: 1.3 mg/dL — ABNORMAL HIGH (ref 0.3–1.2)
Total Protein: 9.4 g/dL — ABNORMAL HIGH (ref 6.0–8.3)

## 2012-11-11 LAB — LACTIC ACID, PLASMA: Lactic Acid, Venous: 2 mmol/L (ref 0.5–2.2)

## 2012-11-11 LAB — CG4 I-STAT (LACTIC ACID): Lactic Acid, Venous: 1.96 mmol/L (ref 0.5–2.2)

## 2012-11-11 MED ORDER — SODIUM CHLORIDE 0.9 % IJ SOLN
3.0000 mL | Freq: Two times a day (BID) | INTRAMUSCULAR | Status: DC
  Administered 2012-11-11 – 2012-11-18 (×10): 3 mL via INTRAVENOUS

## 2012-11-11 MED ORDER — RITONAVIR 100 MG PO TABS
100.0000 mg | ORAL_TABLET | Freq: Every day | ORAL | Status: DC
  Administered 2012-11-12 – 2012-11-18 (×7): 100 mg via ORAL
  Filled 2012-11-11 (×8): qty 1

## 2012-11-11 MED ORDER — MIDODRINE HCL 5 MG PO TABS
10.0000 mg | ORAL_TABLET | Freq: Two times a day (BID) | ORAL | Status: DC
  Administered 2012-11-12 – 2012-11-18 (×12): 10 mg via ORAL
  Filled 2012-11-11 (×14): qty 2

## 2012-11-11 MED ORDER — LEVOFLOXACIN IN D5W 750 MG/150ML IV SOLN
750.0000 mg | Freq: Once | INTRAVENOUS | Status: AC
  Administered 2012-11-11: 750 mg via INTRAVENOUS
  Filled 2012-11-11: qty 150

## 2012-11-11 MED ORDER — VANCOMYCIN HCL IN DEXTROSE 1-5 GM/200ML-% IV SOLN: 1000.0000 mg | Freq: Once | INTRAVENOUS | Status: DC

## 2012-11-11 MED ORDER — LAMIVUDINE 10 MG/ML PO SOLN
50.0000 mg | Freq: Every day | ORAL | Status: DC
  Administered 2012-11-12 – 2012-11-18 (×6): 50 mg via ORAL
  Filled 2012-11-11 (×7): qty 5

## 2012-11-11 MED ORDER — ABACAVIR SULFATE 300 MG PO TABS
600.0000 mg | ORAL_TABLET | Freq: Every day | ORAL | Status: DC
  Administered 2012-11-12 – 2012-11-18 (×7): 600 mg via ORAL
  Filled 2012-11-11 (×7): qty 2

## 2012-11-11 MED ORDER — HEPARIN SODIUM (PORCINE) 5000 UNIT/ML IJ SOLN
5000.0000 [IU] | Freq: Three times a day (TID) | INTRAMUSCULAR | Status: DC
  Administered 2012-11-12 – 2012-11-18 (×16): 5000 [IU] via SUBCUTANEOUS
  Filled 2012-11-11 (×22): qty 1

## 2012-11-11 MED ORDER — CALCIUM ACETATE 667 MG PO CAPS
1334.0000 mg | ORAL_CAPSULE | Freq: Three times a day (TID) | ORAL | Status: DC
Start: 2012-11-12 — End: 2012-11-18
  Administered 2012-11-12 – 2012-11-18 (×14): 1334 mg via ORAL
  Filled 2012-11-11 (×22): qty 2

## 2012-11-11 MED ORDER — SODIUM CHLORIDE 0.9 % IV BOLUS (SEPSIS)
500.0000 mL | Freq: Once | INTRAVENOUS | Status: AC
  Administered 2012-11-11: 500 mL via INTRAVENOUS

## 2012-11-11 MED ORDER — VANCOMYCIN HCL IN DEXTROSE 750-5 MG/150ML-% IV SOLN
750.0000 mg | Freq: Once | INTRAVENOUS | Status: AC
  Administered 2012-11-11: 750 mg via INTRAVENOUS
  Filled 2012-11-11: qty 150

## 2012-11-11 MED ORDER — SODIUM CHLORIDE 0.9 % IV SOLN
INTRAVENOUS | Status: AC
  Administered 2012-11-11: 500 mL via INTRAVENOUS

## 2012-11-11 MED ORDER — DARUNAVIR ETHANOLATE 800 MG PO TABS
800.0000 mg | ORAL_TABLET | Freq: Every day | ORAL | Status: DC
  Administered 2012-11-12 – 2012-11-18 (×7): 800 mg via ORAL
  Filled 2012-11-11 (×8): qty 1

## 2012-11-11 MED ORDER — OXYCODONE HCL 5 MG PO TABS
5.0000 mg | ORAL_TABLET | Freq: Four times a day (QID) | ORAL | Status: DC | PRN
  Administered 2012-11-16: 5 mg via ORAL
  Filled 2012-11-11 (×2): qty 1

## 2012-11-11 MED ORDER — DEXTROSE 5 % IV SOLN
2.0000 g | Freq: Once | INTRAVENOUS | Status: AC
  Administered 2012-11-11: 2 g via INTRAVENOUS
  Filled 2012-11-11: qty 2

## 2012-11-11 MED ORDER — AZTREONAM 1 G IJ SOLR
1.0000 g | INTRAMUSCULAR | Status: DC
  Administered 2012-11-12 – 2012-11-16 (×5): 1 g via INTRAVENOUS
  Filled 2012-11-11 (×7): qty 1

## 2012-11-11 MED ORDER — MIDODRINE HCL 5 MG PO TABS
10.0000 mg | ORAL_TABLET | Freq: Once | ORAL | Status: AC
  Administered 2012-11-11: 10 mg via ORAL
  Filled 2012-11-11 (×2): qty 2

## 2012-11-11 MED ORDER — ASPIRIN 81 MG PO CHEW
81.0000 mg | CHEWABLE_TABLET | Freq: Every day | ORAL | Status: DC
  Administered 2012-11-12 – 2012-11-18 (×7): 81 mg via ORAL
  Filled 2012-11-11 (×7): qty 1

## 2012-11-11 MED ORDER — LEVOFLOXACIN IN D5W 500 MG/100ML IV SOLN: 500.0000 mg | INTRAVENOUS | Status: DC

## 2012-11-11 NOTE — ED Notes (Signed)
Pt brought to ED by EMS with fever from [facility].As per pt she developed fever after dialysis this afternoon.

## 2012-11-11 NOTE — ED Notes (Addendum)
Spoke with EDP about BP and sepsis status with orders. EDP stated to give 5110ml bolus, went into pt's room and admitting MD's at bedside. Spoke with them about 569ml bolus, they requested she get her Midodrine for the night and then see about BP. Pt usually is hypotensive after dialysis and takes this medication twice a day.

## 2012-11-11 NOTE — ED Provider Notes (Signed)
CSN: [id number]     Arrival date & time 11/11/12  1717 History   First MD Initiated Contact with Patient 11/11/12 1721     Chief Complaint  Patient presents with  . Abnormal Lab   (Consider location/radiation/quality/duration/timing/severity/associated sxs/prior Treatment) HPI Comments: Patient is a 57 year old female past medical history significant for HIV (last CD4 in September 2014 340), hypertension, ESRD on dialysis Tuesday Thursday and Saturday, peripheral vascular disease, cardiomyopathy presented to the emergency department for a fever of 104F after patient completed her dialysis today, where she did meet goal weight. She states that she was given a antipyretic and her temperature was rechecked prior to being brought over via EMS and was down to 10 52F. Patient states she has met goal weights at all of her dialysis appointments recently. She denies any precipitating illness. She denies any cough, rhinorrhea, sore throat, vomiting, diarrhea, belly pain, chest pain, shortness of breath. Patient does not believe anybody sick has been around her at [place] home. She denies any issues with her vascular graft access. Patient does not make any urine on her own.    Past Medical History  Diagnosis Date  . Human immunodeficiency virus (HIV) disease   . Hypertension   . Clostridium difficile infection 04/04/10    07/2011 and 08/2011  . Bacteriuria, asymptomatic 04/04/10    Culture grew VRE sensitive to linesolid   . MGUS (monoclonal gammopathy of unknown significance)   . History of bacteremia     MSSA; 05/2011, 08/2011  . Anemia   . Systolic heart failure     08/27/11 EF 35-40%  . Cancer   . ESRD (end stage renal disease) 03/19/2007    Started HD in 2010.  ESRD was due to HIV per pt. Gets HD on [employer] at [facility] on TTS schedule.     . Peripheral vascular disease   . Endocarditis     MSSA  . 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  Clostridium difficile colitis   . Septicemia due to enterococcus    Past Surgical History  Procedure Laterality Date  . Av fistula placement    . Insertion of dialysis catheter  12/30/2010    Procedure: INSERTION OF DIALYSIS CATHETER;  Surgeon: Elam Dutch, MD;  Location: [place];  Service: Vascular;  Laterality: Left;  Insertion of left internal jugular dialysis catheter  . Vascular surgery    . Insertion of dialysis catheter  05/26/2011    Procedure: INSERTION OF DIALYSIS CATHETER;  Surgeon: Conrad Bladensburg, MD;  Location: [place];  Service: Vascular;  Laterality: N/A;  Insertion tunneled dialysis catheter in Right Internal Jugular with 23 cm catheter   . Insertion of dialysis catheter  08/26/2011    Procedure: INSERTION OF DIALYSIS CATHETER;  Surgeon: Elam Dutch, MD;  Location: [facility] OR;  Service: Vascular;  Laterality: Right;  insertion of Right Femoral vein Dialysis catheter  . Tee without cardioversion  08/27/2011    Procedure: TRANSESOPHAGEAL ECHOCARDIOGRAM (TEE);  Surgeon: Pixie Casino, MD;  Location: [place];  Service: Cardiovascular;  Laterality: N/A;  . Av fistula placement  09/01/2011    Procedure: INSERTION OF ARTERIOVENOUS (AV) GORE-TEX GRAFT ARM;  Surgeon: Rosetta Posner, MD;  Location: [facility] OR;  Service: Vascular;  Laterality: Right;  Using 63mm x 50  stretch goretex graft  . Thrombectomy and revision of arterioventous (av) goretex  graft Right 02/13/2012    Procedure: THROMBECTOMY  AND REVISION OF ARTERIOVENTOUS (AV) GORETEX  GRAFT;  Surgeon: Angelia Mould, MD;  Location: [place];  Service: Vascular;  Laterality: Right;  . Tee without cardioversion N/A 02/16/2012    Procedure: TRANSESOPHAGEAL ECHOCARDIOGRAM (TEE);  Surgeon: Fay Records, MD;  Location: [place];  Service: Cardiovascular;  Laterality: N/A;  Rm [id number]  . Avgg removal Right 03/29/2012    Procedure: REMOVAL OF ARTERIOVENOUS GORETEX GRAFT ([place]);  Surgeon: Rosetta Posner, MD;  Location: [place];  Service: Vascular;   Laterality: Right;  . Insertion of dialysis catheter N/A 03/29/2012    Procedure: INSERTION OF DIALYSIS CATHETER;  Surgeon: Rosetta Posner, MD;  Location: [place];  Service: Vascular;  Laterality: N/A;  Ultrasound guided  . Av fistula placement Left 05/03/2012    Procedure: INSERTION OF ARTERIOVENOUS (AV) GORE-TEX GRAFT THIGH USING 69mm X 70cm Gore-Tex Graft ;  Surgeon: Rosetta Posner, MD;  Location: [facility] OR;  Service: Vascular;  Laterality: Left;  . Transthoracic echocardiogram  08/27/2011    EF 35-40%, mild-moderate concetric hypertrophy of the LV, moderately reduced LV systolic function,   Family History  Problem Relation Age of Onset  . Alcohol abuse      family h/o addiction/alcoholism  . Diabetes Cousin     first degree relatives  . Kidney disease Mother   . Kidney disease Maternal Uncle   . Cancer Sister    History  Substance Use Topics  . Smoking status: Never Smoker   . Smokeless tobacco: Never Used  . Alcohol Use: No   OB History   Grav Para Term Preterm Abortions TAB SAB Ect Mult Living                 Review of Systems  Constitutional: Positive for fever.  HENT: Negative for ear pain, rhinorrhea and sore throat.   Respiratory: Negative for cough and shortness of breath.   Cardiovascular: Negative for chest pain and leg swelling.  Gastrointestinal: Negative for nausea, vomiting and diarrhea.  Skin: Negative.   Allergic/Immunologic: Positive for immunocompromised state.  Neurological: Negative for syncope and headaches.  All other systems reviewed and are negative.    Allergies  Penicillins  Home Medications   No current outpatient prescriptions on file. BP 93/47  Pulse 72  Temp(Src) 98.6 F (37 C) (Oral)  Resp 16  Ht 5\' 8"  (1.727 m)  Wt 201 lb 1 oz (91.2 kg)  BMI 30.58 kg/m2  SpO2 96% Physical Exam  Constitutional: She is oriented to person, place, and time. She appears well-developed and well-nourished. No distress.  HENT:  Head: Normocephalic and  atraumatic.  Right Ear: External ear normal.  Left Ear: External ear normal.  Nose: Nose normal.  Mouth/Throat: Uvula is midline. Mucous membranes are dry. No oropharyngeal exudate.  Eyes: Conjunctivae and EOM are normal.  Neck: Neck supple.  Cardiovascular: Normal rate, regular rhythm, normal heart sounds and intact distal pulses.   Pulmonary/Chest: Effort normal and breath sounds normal. No stridor. No respiratory distress. She has no wheezes. She has no rales.  Abdominal: Soft. Bowel sounds are normal. She exhibits no distension. There is no tenderness. There is no rebound and no guarding.  Musculoskeletal: Normal range of motion. She exhibits no edema.  Lymphadenopathy:    She has no cervical adenopathy.  Neurological: She is alert and oriented to person, place, and time.  Skin: Skin is warm and dry. She is not diaphoretic. No erythema.    ED Course  Procedures (including critical care time) Labs  Review Labs Reviewed  CBC WITH DIFFERENTIAL - Abnormal; Notable for the following:    WBC 2.9 (*)    RBC 3.29 (*)    MCV 109.4 (*)    MCH 36.5 (*)    Platelets 139 (*)    Neutrophils Relative % 32 (*)    Lymphocytes Relative 49 (*)    Monocytes Relative 16 (*)    Basophils Relative 2 (*)    Neutro Abs 0.9 (*)    All other components within normal limits  COMPREHENSIVE METABOLIC PANEL - Abnormal; Notable for the following:    Sodium 133 (*)    Chloride 92 (*)    Creatinine, Ser 5.15 (*)    Total Protein 9.4 (*)    AST 293 (*)    ALT 215 (*)    Alkaline Phosphatase 259 (*)    Total Bilirubin 1.3 (*)    GFR calc non Af Amer 9 (*)    GFR calc Af Amer 10 (*)    All other components within normal limits  CULTURE, BLOOD (ROUTINE X 2)  CULTURE, BLOOD (ROUTINE X 2)  MRSA PCR SCREENING  LACTIC ACID, PLASMA  CBC WITH DIFFERENTIAL  COMPREHENSIVE METABOLIC PANEL  PROTIME-INR  BILIRUBIN, DIRECT  CG4 I-STAT (LACTIC ACID)  POCT LACTIC ACID (LACTATE)   Imaging Review Dg Chest Port  1 View  11/11/2012   CLINICAL DATA:  Fever. Short of breath. Dialysis patient.  EXAM: PORTABLE CHEST - 1 VIEW  COMPARISON:  03/29/2012.  FINDINGS: Apical lordotic projection. Cardiopericardial silhouette within normal limits. Interval removal of right IJ dialysis catheter. No airspace disease or effusion. Surgical clips in the axilla bilaterally.  IMPRESSION: No active cardiopulmonary disease.   Electronically Signed   By: Dereck Ligas M.D.   On: 11/11/2012 18:01    EKG Interpretation     Ventricular Rate:    PR Interval:    QRS Duration:   QT Interval:    QTC Calculation:   R Axis:     Text Interpretation:              MDM   1. Neutropenic fever   2. HIV (human immunodeficiency virus infection)   3. ESRD (end stage renal disease) on dialysis   4. Anemia in chronic kidney disease(285.21)   5. End stage renal disease   6. Hypotension   7. Protein calorie malnutrition    Patient pregnancy versus recurrent for fever after completing dialysis today. Patient was given antipyretic prior to arrival to the emergency department. Physical exam unremarkable. No signs of graft infection. Lungs clear to auscultation. Chest x-ray unremarkable. Patient noted to have leukopenia with 0 bands. Source of fever is unclear at this time. Will cover patient with vancomycin and Azactam for neutropenic fever. The patient appears reasonably stabilized for admission considering the current resources, flow, and capabilities available in the ED at this time, and I doubt any other [facility] requiring further screening and/or treatment in the ED prior to admission. Patient d/w with Dr. Dina Rich, agrees with plan.       Harlow Mares, PA-C 11/12/12 [place], PA-C 11/12/12 0038

## 2012-11-11 NOTE — ED Notes (Signed)
Admitting MD at bedside.

## 2012-11-11 NOTE — H&P (Signed)
Date: 11/11/2012               Patient Name:  [name] MRN: [id number]  DOB: 18-Feb-1955 Age / Sex: 57 y.o., female   PCP: Ivin Poot, MD         Medical Service: Internal Medicine Teaching Service         Attending Physician: Dr. Merryl Hacker, MD    First Contact: Dr. Ronnald Ramp Pager: [phone]  Second Contact: Dr. Aundra Dubin Pager: [phone]       After Hours (After 5p/  First Contact Pager: [phone]  weekends / holidays): Second Contact Pager: [phone]   Chief Complaint: Fever  History of Present Illness: [name] is a 57 year old female with PMH of ESRD on HD TTS, Pancytopenia (baseline Hgb ), HIV (CD4 320, undect viral load on 09/13/12), and multiple hospitalizations for fever and found to be bacteremic.    Complicated Infectious disease history, not limited to summary below:  02/11/12 --> MRSA Bacteremia with HCAP, blood cx 2/6, leukocytosis (10.9), Tm 104 (TEE - no vegetations, vascular revised graft - not infected) --> treated with Vanc, Cefepime, Zyvox, Ancef --> Vanc x 6 weeks  12/19/11 --> HCAP with possible bacteremia, blood cx 1/4 gram + cocci in pairs & chains (ID thought contaminant), normal wbc, Tm 105.8 --> treated with levaquin, aztreonam, daptomycin (given VRE hx), Vanc  08/21/11 --> VRE bacteremia & C.diff, 4/4 blood cx & right chest HD catheter tip + enterococcus 80K colonies, WBC 2.8, Abs Neut 1.6, Tm 103, TEE with small calcified mobile mass on chordae tendinae --> treated with Rocephin, vanc, zyvoxx, Daptomycin --> Dapto x 2 weeks  07/11/11 --> C diff with Tm 103.6, WBC 1.6  05/18/11 --> Bacteremia (likely d/t HD catheter) with bacterial endocarditis & HCAP --> Vanc/Zosyn then Ancef x 6 weeks  12/23/10 --> MSSA bacteremia with RLL PNA --> Ancef x 6 weeks   Patient went to dialysis today per her normal schedule.  At dialysis she was noted to have a fever of 104.  She was given tylenol. On return to [facility] her temperature was checked and was noted to be 102.   She was then sent to the [facility] to be evaluated given her history.  On presentation to the ED she was found to have a oral temp of 99.8 and a BP of 65/25 with an ANC of 900. Per the patient she has remained asymptomatic throughout the day.  She denies subjective fever or chills, denies any pain, denies cough or SOB, does not make urine, denies any HA or neck stiffness.  She denies any open wounds or sores, denies any irritation at her fistula site. Her BP tends to run low after HD and she takes midodrine for this, she reports taking her AM dose but has not yet taken her PM dose.   Meds: Current Facility-Administered Medications  Medication Dose Route Frequency Provider Last Rate Last Dose  . [START ON 11/12/2012] aztreonam (AZACTAM) 1 g in dextrose 5 % 50 mL IVPB  1 g Intravenous Q24H Dareen Piano, RPH      . aztreonam (AZACTAM) 2 g in dextrose 5 % 50 mL IVPB  2 g Intravenous Once Jennifer L Piepenbrink, PA-C      . [START ON 11/13/2012] levofloxacin (LEVAQUIN) IVPB 500 mg  500 mg Intravenous Q48H Dareen Piano, RPH      . midodrine (PROAMATINE) tablet 10 mg  10 mg Oral Once Joni Reining, DO      .  vancomycin (VANCOCIN) IVPB 1000 mg/200 mL premix  1,000 mg Intravenous Once Jennifer L Piepenbrink, PA-C      . vancomycin (VANCOCIN) IVPB 750 mg/150 ml premix  750 mg Intravenous Once Dareen Piano, [facility]       Current Outpatient Prescriptions  Medication Sig Dispense Refill  . abacavir (ZIAGEN) 300 MG tablet Take 600 mg by mouth daily at 12 noon.       [name] aspirin 81 MG chewable tablet Chew 81 mg by mouth daily at 12 noon.       [name] b complex vitamins tablet Take 1 tablet by mouth daily at 12 noon.       . calcium acetate (PHOSLO) 667 MG capsule Take 2 capsules (1,334 mg total) by mouth 3 (three) times daily with meals.      . Darunavir Ethanolate (PREZISTA) 800 MG tablet Take 1 tablet (800 mg total) by mouth daily with breakfast.  30 tablet  0  . ethyl chloride spray Apply 1 application  topically 3 (three) times a week. To graft for dialysis Tuesday, Thursday and Saturday      . guaifenesin (ROBITUSSIN) 100 MG/5ML syrup Take 200 mg by mouth every 6 (six) hours as needed for cough or congestion.      [name] lamiVUDine (EPIVIR) 10 MG/ML solution Take 50 mg by mouth daily at 12 noon.      . midodrine (PROAMATINE) 10 MG tablet Take 10 mg by mouth 2 (two) times daily. At 630am and 10pm      . Multiple Vitamins-Minerals (CERTAGEN PO) Take 1 tablet by mouth daily at 12 noon.      [name] oxyCODONE (OXY IR/ROXICODONE) 5 MG immediate release tablet Take 5 mg by mouth every 6 (six) hours as needed (for pain).      . ritonavir (NORVIR) 100 MG TABS Take 1 tablet (100 mg total) by mouth daily with breakfast.  30 tablet  0    Allergies: Allergies as of 11/11/2012 - Review Complete 11/11/2012  Allergen Reaction Noted  . Penicillins Nausea And Vomiting and Rash 12/23/2010   Past Medical History  Diagnosis Date  . Human immunodeficiency virus (HIV) disease   . Hypertension   . Clostridium difficile infection 04/04/10    07/2011 and 08/2011  . Bacteriuria, asymptomatic 04/04/10    Culture grew VRE sensitive to linesolid   . MGUS (monoclonal gammopathy of unknown significance)   . History of bacteremia     MSSA; 05/2011, 08/2011  . Anemia   . Systolic heart failure     08/27/11 EF 35-40%  . Cancer   . ESRD (end stage renal disease) 03/19/2007    Started HD in 2010.  ESRD was due to HIV per pt. Gets HD on [employer] at [facility] on TTS schedule.     . Peripheral vascular disease   . Endocarditis     MSSA  . 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 Clostridium difficile colitis   . Septicemia due to enterococcus    Past Surgical History  Procedure Laterality Date  . Av fistula placement    . Insertion of dialysis catheter  12/30/2010    Procedure: INSERTION OF DIALYSIS CATHETER;  Surgeon: Elam Dutch, MD;  Location: [place];  Service: Vascular;   Laterality: Left;  Insertion of left internal jugular dialysis catheter  . Vascular surgery    . Insertion of dialysis catheter  05/26/2011    Procedure: INSERTION OF  DIALYSIS CATHETER;  Surgeon: Conrad [place], MD;  Location: [place];  Service: Vascular;  Laterality: N/A;  Insertion tunneled dialysis catheter in Right Internal Jugular with 23 cm catheter   . Insertion of dialysis catheter  08/26/2011    Procedure: INSERTION OF DIALYSIS CATHETER;  Surgeon: Elam Dutch, MD;  Location: [facility] OR;  Service: Vascular;  Laterality: Right;  insertion of Right Femoral vein Dialysis catheter  . Tee without cardioversion  08/27/2011    Procedure: TRANSESOPHAGEAL ECHOCARDIOGRAM (TEE);  Surgeon: Pixie Casino, MD;  Location: [place];  Service: Cardiovascular;  Laterality: N/A;  . Av fistula placement  09/01/2011    Procedure: INSERTION OF ARTERIOVENOUS (AV) GORE-TEX GRAFT ARM;  Surgeon: Rosetta Posner, MD;  Location: [facility] OR;  Service: Vascular;  Laterality: Right;  Using 66mm x 50  stretch goretex graft  . Thrombectomy and revision of arterioventous (av) goretex  graft Right 02/13/2012    Procedure: THROMBECTOMY AND REVISION OF ARTERIOVENTOUS (AV) GORETEX  GRAFT;  Surgeon: Angelia Mould, MD;  Location: [place];  Service: Vascular;  Laterality: Right;  . Tee without cardioversion N/A 02/16/2012    Procedure: TRANSESOPHAGEAL ECHOCARDIOGRAM (TEE);  Surgeon: Fay Records, MD;  Location: [place];  Service: Cardiovascular;  Laterality: N/A;  Rm [id number]  . Avgg removal Right 03/29/2012    Procedure: REMOVAL OF ARTERIOVENOUS GORETEX GRAFT ([place]);  Surgeon: Rosetta Posner, MD;  Location: [place];  Service: Vascular;  Laterality: Right;  . Insertion of dialysis catheter N/A 03/29/2012    Procedure: INSERTION OF DIALYSIS CATHETER;  Surgeon: Rosetta Posner, MD;  Location: [place];  Service: Vascular;  Laterality: N/A;  Ultrasound guided  . Av fistula placement Left 05/03/2012    Procedure: INSERTION OF ARTERIOVENOUS (AV)  GORE-TEX GRAFT THIGH USING 47mm X 70cm Gore-Tex Graft ;  Surgeon: Rosetta Posner, MD;  Location[facility] OR;  Service: Vascular;  Laterality: Left;  . Transthoracic echocardiogram  08/27/2011    EF 35-40%, mild-moderate concetric hypertrophy of the LV, moderately reduced LV systolic function,   Family History  Problem Relation Age of Onset  . Alcohol abuse      family h/o addiction/alcoholism  . Diabetes Cousin     first degree relatives  . Kidney disease Mother   . Kidney disease Maternal Uncle   . Cancer Sister    History   Social History  . Marital Status: Single    Spouse Name: N/A    Number of Children: N/A  . Years of Education: N/A   Occupational History  . Not on file.   Social History Main Topics  . Smoking status: Never Smoker   . Smokeless tobacco: Never Used  . Alcohol Use: No  . Drug Use: Yes    Special: Cocaine     Comment: quit 62yrs ago  . Sexual Activity: No   Other Topics Concern  . Not on file   Social History Narrative   Lives with daughter(does not know dx). Oldest daughter does not dx.   Current partner of may years,? fidelity    Review of Systems: Review of Systems  Constitutional: Negative for fever, chills, malaise/fatigue and diaphoresis.  HENT: Negative for congestion.   Eyes: Negative for blurred vision and discharge.  Respiratory: Negative for cough, sputum production, shortness of breath and wheezing.   Cardiovascular: Negative for chest pain, palpitations, orthopnea and leg swelling.  Gastrointestinal: Negative for nausea, vomiting, abdominal pain, diarrhea and constipation.  Genitourinary: Negative for dysuria (does not make  urine).  Musculoskeletal: Negative for back pain, joint pain, myalgias and neck pain.  Skin: Negative for rash.  Neurological: Negative for dizziness, focal weakness, weakness and headaches.  Psychiatric/Behavioral: The patient is not nervous/anxious.      Physical Exam: Blood pressure 76/48, pulse 72, temperature  99.5 F (37.5 C), temperature source Oral, resp. rate 16, SpO2 93.00%. Physical Exam  Nursing note and vitals reviewed. Constitutional: She is oriented to person, place, and time and well-developed, well-nourished, and in no distress. No distress.  HENT:  Head: Normocephalic and atraumatic.  Eyes: EOM are normal.  Neck: Normal range of motion.  Cardiovascular: Normal rate, regular rhythm and intact distal pulses.   Murmur (2/6 systolic heard best over 2nd ICS LSB) heard. Pulmonary/Chest: Effort normal and breath sounds normal. No respiratory distress. She has no wheezes. She exhibits no tenderness.  Abdominal: Soft. Bowel sounds are normal. She exhibits no distension. There is no tenderness.  Musculoskeletal: She exhibits edema (trace pedal edema).  Neurological: She is alert and oriented to person, place, and time.  Skin: Skin is warm and dry. She is not diaphoretic.  Chronic venous statis changes to LE b/l No lacerations, or ulcers appreciated over extremities or sacral area. +bruit. +thrill over fistula in LLE, no tenderness or fluctuance   Psychiatric: Mood, memory, affect and judgment normal.     Lab results: Basic Metabolic Panel:  Recent Labs  11/11/12 1855  NA 133*  K 4.3  CL 92*  CO2 28  GLUCOSE 92  BUN 23  CREATININE 5.15*  CALCIUM 10.3   Liver Function Tests:  Recent Labs  11/11/12 1855  AST 293*  ALT 215*  ALKPHOS 259*  BILITOT 1.3*  PROT 9.4*  ALBUMIN 3.8   CBC:  Recent Labs  11/11/12 1855  WBC 2.9*  NEUTROABS 0.9*  HGB 12.0  HCT 36.0  MCV 109.4*  PLT 139*     Imaging results:  Dg Chest Port 1 View  11/11/2012   CLINICAL DATA:  Fever. Short of breath. Dialysis patient.  EXAM: PORTABLE CHEST - 1 VIEW  COMPARISON:  03/29/2012.  FINDINGS: Apical lordotic projection. Cardiopericardial silhouette within normal limits. Interval removal of right IJ dialysis catheter. No airspace disease or effusion. Surgical clips in the axilla bilaterally.   IMPRESSION: No active cardiopulmonary disease.   Electronically Signed   By: Dereck Ligas M.D.   On: 11/11/2012 18:01    Other results: EKG: NSR, rate 82, left anterior fascicular block, poor R wave progression, T wave inversions no longer apparent in lateral leads as compared to previous EKG.  Assessment & Plan by Problem:   Neutropenic fever meeting criteria for SIRS Tmax 104, ANC of 900, does meet criteria for Neutropenia but not for severe Neutropenia.  She does meet criteria for SIRS given her low WBC count and fever.  At this time there is no obvious source of infection. CXR portable 1 view shows no sign of respiratory source and patient denies cough, Patient does not make urine, no signs of obvious infection at fistula site, no HA, AMS, or stiff neck, no diarrhea.  Lactic acid was wnl. - Admit to Stepdown (inpatient) - Follow Blood cultures - Continue IV Vanc and Aztreonam. D/C Levaquin -  If patient spikes a fever overnight will add Daptomycin to cover for VRE. - Consult ID -AM CBC w/ diff   Hypotension - Improved after 500cc bolus -  Continue patient's midodrine  - NS IVF at 50cc/hr for 5 hours, will monitor pressures and  bolus if necessary.   HIV INFECTION -CD4 320 in September with undect viral load - Continue home HIV medications   Pancytopenia (chronic) - Chronic, Patient did have a bone marrow Bx in 2012 that was hypercellular thought likely due to uncontrolled HIV. However last CBC in September had WBC of 4.1, mild anemia, and platelets of 170. .   -At Baseline Hgb (11-12), MCV is 109. Leukopenia 2.9, Thrombocytopenia at 139.   ESRD (end stage renal disease) on HD TTS -Consult Nephrology in AM to continue HD. -Careful IV hydration. Transaminitis -AST 25 in May 2014.  AST and ALT are elevated to ~5 times upper limit. Total bili slightly elevated at 1.3.  Patient has no history of Hep B or C.  She does have a history of gallstones but does not currently have any abdominal  pain.  She was hypotensive in the ED and there is concern for SIRS.  At this time this is not felt to be hypoperfusion of the liver.  Patient has not had any recent medication changes to cause an elevation of liver enzymes.  Although her HIV medications could cause Transaminitis. -obtain AM CMP, if liver enzymes do not trend down will obtain RUQ U/S, and Hepatitis Panel.  -Check Direct Bili -NPO after midnight   Hx Endocarditis -Consider TEE if BCx +. -  Patient does have a systolic murmur that was present on previous admissions (at least as early as Dec 2012), no noted MR, AS, or PS on TEE in February 2014.   Dispo: Disposition is deferred at this time, awaiting improvement of current medical problems. Anticipated discharge in approximately 2-3 day(s).   The patient does have a current PCP Ivin Poot, MD) and does need an [facility] hospital follow-up appointment after discharge.  The patient does not know have transportation limitations that hinder transportation to clinic appointments.  Signed: Joni Reining, DO 11/11/2012, 10:04 PM

## 2012-11-11 NOTE — Progress Notes (Addendum)
ANTIBIOTIC CONSULT NOTE - INITIAL  Pharmacy Consult for vancomycin, azactam, levaquin,  Indication: fever in immunocompromised patient  Allergies  Allergen Reactions  . Penicillins Nausea And Vomiting and Rash    Patient Measurements: Wt= 84kg  Vital Signs: Temp: 99.5 F (37.5 C) ([name]/06 1939) Temp src: Oral ([name]/06 1939) BP: 73/45 mmHg ([name]/06 1933) Pulse Rate: 81 ([name]/06 1933) Intake/Output from previous day:   Intake/Output from this shift:    Labs:  Recent Labs  [name]/06/14 1855  WBC 2.9*  HGB 12.0  PLT 139*  CREATININE 5.15*   The CrCl is unknown because both a height and weight (above a minimum accepted value) are required for this calculation. No results found for this basename: VANCOTROUGH, VANCOPEAK, VANCORANDOM, GENTTROUGH, GENTPEAK, GENTRANDOM, TOBRATROUGH, TOBRAPEAK, TOBRARND, AMIKACINPEAK, AMIKACINTROU, AMIKACIN,  in the last 72 hours   Microbiology: No results found for this or any previous visit (from the past 720 hour(s)).  Medical History: Past Medical History  Diagnosis Date  . Human immunodeficiency virus (HIV) disease   . Hypertension   . Clostridium difficile infection 04/04/10    07/2011 and 08/2011  . Bacteriuria, asymptomatic 04/04/10    Culture grew VRE sensitive to linesolid   . MGUS (monoclonal gammopathy of unknown significance)   . History of bacteremia     MSSA; 05/2011, 08/2011  . Anemia   . Systolic heart failure     08/27/11 EF 35-40%  . Cancer   . ESRD (end stage renal disease) 03/19/2007    Started HD in 2010.  ESRD was due to HIV per pt. Gets HD on [employer] at [facility] on TTS schedule.     . Peripheral vascular disease   . Endocarditis     MSSA  . 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 End stage renal disease on dialysis   . Clostridium difficile colitis   . Septicemia due to enterococcus      Assessment: 57 yo [name] from [place] [facility] with history of HIV here with fever and to  begin antibiotics for r/o sepsis.  Pharmacy has been asked to dose vancomycin, azactam, and levaquin  (patient noted with PCN allergy). Patient is also noted with history of ESRD (HD on TTS; last HD today).    The following has been ordered in the ED -azactam 2gm -Vancomycin 1000mg  -Levaquin 750mg   Goal of Therapy:  Pre-HD vancomycin trough= 15-25  Plan:  -Levaquin 500mg  IV q48h -Azactam 1gm IV q24hr -Vancomycin 750mg  IV x1 to complete a total of 1750mg  -Will follow HD regimen to determine start of maintenance dosing -Will follow renal function, cultures and clinical progress  Hildred Laser, Pharm D [name]/06/2012 8:04 PM

## 2012-11-12 ENCOUNTER — Encounter (HOSPITAL_COMMUNITY): Payer: Self-pay | Admitting: Neurology

## 2012-11-12 DIAGNOSIS — I330 Acute and subacute infective endocarditis: Secondary | ICD-10-CM

## 2012-11-12 DIAGNOSIS — R509 Fever, unspecified: Secondary | ICD-10-CM

## 2012-11-12 DIAGNOSIS — A419 Sepsis, unspecified organism: Secondary | ICD-10-CM

## 2012-11-12 DIAGNOSIS — Z8614 Personal history of Methicillin resistant Staphylococcus aureus infection: Secondary | ICD-10-CM

## 2012-11-12 DIAGNOSIS — Z21 Asymptomatic human immunodeficiency virus [HIV] infection status: Secondary | ICD-10-CM

## 2012-11-12 DIAGNOSIS — A4901 Methicillin susceptible Staphylococcus aureus infection, unspecified site: Secondary | ICD-10-CM

## 2012-11-12 DIAGNOSIS — N186 End stage renal disease: Secondary | ICD-10-CM

## 2012-11-12 LAB — CBC WITH DIFFERENTIAL/PLATELET
Basophils Absolute: 0 10*3/uL (ref 0.0–0.1)
Basophils Relative: 0 % (ref 0–1)
Eosinophils Absolute: 0 10*3/uL (ref 0.0–0.7)
Eosinophils Relative: 1 % (ref 0–5)
HCT: 33.9 % — ABNORMAL LOW (ref 36.0–46.0)
Hemoglobin: 11.4 g/dL — ABNORMAL LOW (ref 12.0–15.0)
Lymphocytes Relative: 47 % — ABNORMAL HIGH (ref 12–46)
Lymphs Abs: 1.4 10*3/uL (ref 0.7–4.0)
MCH: 37.3 pg — ABNORMAL HIGH (ref 26.0–34.0)
MCHC: 33.6 g/dL (ref 30.0–36.0)
MCV: 110.8 fL — ABNORMAL HIGH (ref 78.0–100.0)
Monocytes Absolute: 0.6 10*3/uL (ref 0.1–1.0)
Monocytes Relative: 22 % — ABNORMAL HIGH (ref 3–12)
Neutro Abs: 0.9 10*3/uL — ABNORMAL LOW (ref 1.7–7.7)
Neutrophils Relative %: 30 % — ABNORMAL LOW (ref 43–77)
Platelets: 134 10*3/uL — ABNORMAL LOW (ref 150–400)
RBC: 3.06 MIL/uL — ABNORMAL LOW (ref 3.87–5.11)
RDW: 12.6 % (ref 11.5–15.5)
WBC: 2.9 10*3/uL — ABNORMAL LOW (ref 4.0–10.5)

## 2012-11-12 LAB — COMPREHENSIVE METABOLIC PANEL
ALT: 180 U/L — ABNORMAL HIGH (ref 0–35)
AST: 240 U/L — ABNORMAL HIGH (ref 0–37)
Albumin: 3.3 g/dL — ABNORMAL LOW (ref 3.5–5.2)
Alkaline Phosphatase: 229 U/L — ABNORMAL HIGH (ref 39–117)
BUN: 27 mg/dL — ABNORMAL HIGH (ref 6–23)
CO2: 26 mEq/L (ref 19–32)
Calcium: 9.8 mg/dL (ref 8.4–10.5)
Chloride: 92 mEq/L — ABNORMAL LOW (ref 96–112)
Creatinine, Ser: 5.4 mg/dL — ABNORMAL HIGH (ref 0.50–1.10)
GFR calc Af Amer: 9 mL/min — ABNORMAL LOW (ref >90)
GFR calc non Af Amer: 8 mL/min — ABNORMAL LOW (ref >90)
Glucose, Bld: 94 mg/dL (ref 70–99)
Potassium: 4.8 mEq/L (ref 3.5–5.1)
Sodium: 130 mEq/L — ABNORMAL LOW (ref 135–145)
Total Bilirubin: 1.1 mg/dL (ref 0.3–1.2)
Total Protein: 8.7 g/dL — ABNORMAL HIGH (ref 6.0–8.3)

## 2012-11-12 LAB — PROTIME-INR
INR: 1.19 (ref 0.00–1.49)
Prothrombin Time: 14.8 seconds (ref 11.6–15.2)

## 2012-11-12 LAB — PATHOLOGIST SMEAR REVIEW

## 2012-11-12 LAB — BILIRUBIN, DIRECT: Bilirubin, Direct: 0.2 mg/dL (ref 0.0–0.3)

## 2012-11-12 LAB — MRSA PCR SCREENING: MRSA by PCR: NEGATIVE

## 2012-11-12 MED ORDER — DOXERCALCIFEROL 4 MCG/2ML IV SOLN
6.0000 ug | INTRAVENOUS | Status: DC
  Administered 2012-11-13: 6 ug via INTRAVENOUS
  Filled 2012-11-12: qty 4

## 2012-11-12 MED ORDER — VANCOMYCIN HCL IN DEXTROSE 1-5 GM/200ML-% IV SOLN
1000.0000 mg | INTRAVENOUS | Status: DC
  Administered 2012-11-13 – 2012-11-16 (×2): 1000 mg via INTRAVENOUS
  Filled 2012-11-12 (×5): qty 200

## 2012-11-12 MED ORDER — VANCOMYCIN HCL IN DEXTROSE 1-5 GM/200ML-% IV SOLN
1000.0000 mg | Freq: Once | INTRAVENOUS | Status: AC
  Administered 2012-11-12: 1000 mg via INTRAVENOUS
  Filled 2012-11-12 (×2): qty 200

## 2012-11-12 NOTE — Progress Notes (Signed)
Subjective: [name] is a 57 year old female with PMH of ESRD on HD TTS, Pancytopenia (baseline Hgb ~10), HIV (CD4 320, undect viral load on 09/13/12), and multiple hospitalizations for fever and bacteremia, found to be febrile to 104 at HD on 11/11/12.   Patient seen at bedside this afternoon. Says she has no complaints today. Denies any pain, fever or chills. BP shown to be low, in the 80's/50's. However, after discussion with the patient, she has had AVG on both right and left arms in the past. After moving BP cuff to the thigh, BP 124/57.  No fever throughout the day.  Objective: Vital signs in last 24 hours: Filed Vitals:   11/12/12 1500 11/12/12 1505 11/12/12 1523 11/12/12 1530  BP: 82/48 87/49 103/57 124/57  Pulse: 70 92  68  Temp:      TempSrc:      Resp: 18 20  16   Height:      Weight:      SpO2:       Weight change:   Intake/Output Summary (Last 24 hours) at 11/12/12 1552 Last data filed at 11/11/12 2353  Gross per 24 hour  Intake    150 ml  Output      0 ml  Net    150 ml   Physical Exam: General: Alert, cooperative, and in no apparent distress HEENT: Vision grossly intact, oropharynx clear and non-erythematous  Neck: Full range of motion without pain, supple, no lymphadenopathy or carotid bruits Lungs: Clear to ascultation bilaterally, normal work of respiration, no wheezes, rales, ronchi Heart: Regular rate and rhythm, 2/6 systolic murmur, no gallops, or rubs Abdomen: Soft, non-tender, non-distended, normal bowel sounds Extremities: No cyanosis, clubbing, or edema. Multiple scars in UE's from previous grafts in the past. AVG in left thigh w/ thrill and bruit. Neurologic: Alert & oriented X3, cranial nerves II-XII intact, strength grossly intact, sensation intact to light touch  Lab Results: Basic Metabolic Panel:  Recent Labs Lab 11/11/12 1855 11/12/12 0125  NA 133* 130*  K 4.3 4.8  CL 92* 92*  CO2 28 26  GLUCOSE 92 94  BUN 23 27*  CREATININE  5.15* 5.40*  CALCIUM 10.3 9.8   Liver Function Tests:  Recent Labs Lab 11/11/12 1855 11/12/12 0125  AST 293* 240*  ALT 215* 180*  ALKPHOS 259* 229*  BILITOT 1.3* 1.1  PROT 9.4* 8.7*  ALBUMIN 3.8 3.3*   CBC:  Recent Labs Lab 11/11/12 1855 11/12/12 0125  WBC 2.9* 2.9*  NEUTROABS 0.9* 0.9*  HGB 12.0 11.4*  HCT 36.0 33.9*  MCV 109.4* 110.8*  PLT 139* 134*   Coagulation:  Recent Labs Lab 11/12/12 0125  LABPROT 14.8  INR 1.19    Micro Results: Recent Results (from the past 240 hour(s))  MRSA PCR SCREENING     Status: None   Collection Time    11/11/12 11:15 PM      Result Value Range Status   MRSA by PCR NEGATIVE  NEGATIVE Final   Comment:            The GeneXpert MRSA Assay (FDA     approved for NASAL specimens     only), is one component of a     comprehensive MRSA colonization     surveillance program. It is not     intended to diagnose MRSA     infection nor to guide or     monitor treatment for     MRSA infections.  Studies/Results: Dg Chest Port 1 View  11/11/2012   CLINICAL DATA:  Fever. Short of breath. Dialysis patient.  EXAM: PORTABLE CHEST - 1 VIEW  COMPARISON:  03/29/2012.  FINDINGS: Apical lordotic projection. Cardiopericardial silhouette within normal limits. Interval removal of right IJ dialysis catheter. No airspace disease or effusion. Surgical clips in the axilla bilaterally.  IMPRESSION: No active cardiopulmonary disease.   Electronically Signed   By: Dereck Ligas M.D.   On: 11/11/2012 18:01   Medications: I have reviewed the patient's current medications. Scheduled Meds: . abacavir  600 mg Oral Q1200  . aspirin  81 mg Oral Q1200  . aztreonam  1 g Intravenous Q24H  . calcium acetate  1,334 mg Oral TID WC  . Darunavir Ethanolate  800 mg Oral Q breakfast  . [START ON 11/13/2012] doxercalciferol  6 mcg Intravenous Q T,Th,Sa-HD  . heparin  5,000 Units Subcutaneous Q8H  . lamiVUDine  50 mg Oral Q1200  . midodrine  10 mg Oral BID  .  ritonavir  100 mg Oral Q breakfast  . sodium chloride  3 mL Intravenous Q12H  . vancomycin  1,000 mg Intravenous Once  . [START ON 11/13/2012] vancomycin  1,000 mg Intravenous Q T,Th,Sa-HD   Continuous Infusions:  PRN Meds:.oxyCODONE  Assessment/Plan: [name] is a 57 year old female with PMH of ESRD on HD TTS, Pancytopenia (baseline Hgb ~10), HIV (CD4 320, undect viral load on 09/13/12), and multiple hospitalizations for fever and bacteremia, found to be febrile to 104 at HD on 11/11/12.   Patient with extensive ID history in the past as follows: 02/11/12 --> MRSA Bacteremia with HCAP, blood cx 2/6, leukocytosis (10.9), Tm 104 (TEE - no vegetations, vascular revised graft - not infected) --> treated with Vanc, Cefepime, Zyvox, Ancef --> Vanc x 6 weeks 12/19/11 --> HCAP with possible bacteremia, blood cx 1/4 gram + cocci in pairs & chains (ID thought contaminant), normal wbc, Tm 105.8 --> treated with levaquin, aztreonam, daptomycin (given VRE hx), Vanc  08/21/11 --> VRE bacteremia & C.diff, 4/4 blood cx & right chest HD catheter tip + enterococcus 80K colonies, WBC 2.8, Abs Neut 1.6, Tm 103, TEE with small calcified mobile mass on chordae tendinae --> treated with Rocephin, vanc, zyvoxx, Daptomycin --> Dapto x 2 weeks  07/11/11 --> C diff with Tm 103.6, WBC 1.6  05/18/11 --> Bacteremia (likely d/t HD catheter) with bacterial endocarditis & HCAP --> Vanc/Zosyn then Ancef x 6 weeks  12/23/10 --> MSSA bacteremia with RLL PNA --> Ancef x 6 weeks  Sepsis w/ unknown origin- Patient came from HD center with fever of 104. NO cultures drawn at dialysis center. Meets SIRS criteria w/ fever and WBC's of 2.9. At this time there is no obvious source of infection. CXR portable 1 view shows no sign of respiratory source and patient denies cough, Patient does not make urine, no signs of obvious infection at fistula site, no HA, AMS, or stiff neck, no diarrhea. Lactic acid was wnl on admission. Blood cultures drawn,  started on IV Vancomycin, and Aztreonam.  -ID consulted, appreciate recommendations. 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 Also recommended straight cath for urine culture. Suggested CT chest abdomen pelvis, will consider in the AM. -Will perform repeat CBC with diff in the AM  Hypotension- Presented to ED w/ BP of 65/25. Improved after patient was bolused w/ 500 cc. Has had multiple UE vascular surgeries.  BP also low throughout the day. After cuff was switched to right thigh, pressure was 124/57 -Continue Midodrine -Continue to monitor pressures on thigh if necessary, will bolus if Hypotensive.   Transaminitis- AST 25 in May 2014. AST and ALT are elevated to ~5 times upper limit. Total bili slightly elevated at 1.3. Patient has no history of Hep B or C. She does have a history of gallstones but does not currently have any abdominal pain. -Repeat LFT's slightly lower than admission CMET. Will continue to follow. If still elevated in AM, will consider imaging, either CT (as suggested by ID) or RUQ [place].  -Per ID note, still a possibility her HIV meds are responsible for her elevated LFTs.  History of Endocarditis- MSSA -Consider TEE if BCx +.  - Patient does have a systolic murmur that was present on previous admissions (at least as early as Dec 2012), no noted MR, AS, or PS on TEE in February 2014.  HIV -CD4 320 in September with undect viral load  -Continue home HIV medications   Pancytopenia (chronic) -Chronic, patient did have a bone marrow Bx in 2012 that was hypercellular thought likely due to uncontrolled HIV. However last CBC in September had WBC of 4.1, mild anemia, and platelets of 170. .  -At Baseline Hgb (11-12), MCV is 109. Leukopenia 2.9, Thrombocytopenia at 139.  ESRD (end stage renal disease) on HD TTS- Nephrology consulted today. Will perform HD tomorrow.  Dispo: Disposition is deferred at this time,  awaiting improvement of current medical problems.  Anticipated discharge in approximately 2-3 day(s).   The patient does have a current PCP Ivin Poot, MD) and does need an [facility] hospital follow-up appointment after discharge.  The patient does not have transportation limitations that hinder transportation to clinic appointments.  .Services Needed at time of discharge: Y = Yes, Blank = No PT:   OT:   RN:   Equipment:   Other:     LOS: 1 day   Corky Sox, MD 11/12/2012, 3:52 PM Pager: [phone]

## 2012-11-12 NOTE — Progress Notes (Signed)
Patient was admitted   And oriented to room  Call bell in place ,

## 2012-11-12 NOTE — H&P (Signed)
INTERNAL MEDICINE TEACHING SERVICE Attending Admission Note  Date: 11/12/2012  Patient name: [name]  Medical record number: [id number]  Date of birth: Jun 08, 1955    I have seen and evaluated [name] and discussed their care with the Residency Team.  57 yr old female with an extensive past medical history including MRSA bacteremia, HCAP, VRE bacteremia, Cdiff, HIV, ESRD on TTS, pancytopenia, who presented with fever.  ANC is noted to be 900, she is neutropenic, but not severely. She meets SIRS criteria. I see no definite source, though she has a L fem vein AVF which is always suspect. BC x2 . Agree with Vanc and aztreonam. She is likely not hypotensive as her UE BP's may not be accurate due to previous AVF bilat. Consult ID for further recs.  She states she feels well today. Denies SOB, CP, N/V/D/C. Denies abdominal pain. Her LLE AVF site is clean without obvious evidence of infection.  Dominic Pea, DO, [facility] 11/12/2012, 1:28 PM

## 2012-11-12 NOTE — Consult Note (Signed)
[facility] for Infectious Disease    Date of Admission:  11/11/2012  Date of Consult:  11/12/2012  Reason for Consult: FUO Referring Physician: Dr. Murlean Caller   HPI: [name] is an 57 y.o. female old African American lady with HIV currently perfectly controlled and ESRD on HD with with prior MSSA endocarditis, mx episodes of MSSA and MRSA bacteremia, who had most  Recent episode of MRSA bacteremia in February. She was admitted to and underwent exploration and revision of her graft by VVS with concern for infection though no obvious infection was found in the graft. She had TEE which failed to show any evidence of vegetation. SHe cleared her bacteremia by the 7th of February= date of her graft revisions as well and has Competed a course of IV vancomycin with. SHe has since had removal of AV graft in March and insertion of new thigh graft in April of 2014.  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   She had been doing well and her HIV had remain perfectly well controlled I last saw her in September with an undetectable viral load and healthy CD4 count on Prezista, Norvir, ABC and 3tc.   At dialysis she was noted to have a fever of 104. She was given tylenol. On return to [facility] her temperature was checked and was noted to be 102. She was then sent to the [facility] and has been admitted to the Teaching Service. I do not know if blood cultures were obtained at [place] or if ABx were given. She was hypotensive here in ED and with evidence of "shock liver." She has had blood cultures taken her and has been started on vancomycin and aztreonam.       Past Medical History  Diagnosis Date  . Human immunodeficiency virus (HIV) disease   . Hypertension   . Clostridium difficile infection 04/04/10    07/2011 and 08/2011  . Bacteriuria, asymptomatic 04/04/10    Culture grew VRE sensitive to linesolid   . MGUS (monoclonal gammopathy of unknown significance)   .  History of bacteremia     MSSA; 05/2011, 08/2011  . Anemia   . Systolic heart failure     08/27/11 EF 35-40%  . Cancer   . ESRD (end stage renal disease) 03/19/2007    Started HD in 2010.  ESRD was due to HIV per pt. Gets HD on [employer] at [facility] on TTS schedule.     . Peripheral vascular disease   . Endocarditis     MSSA  . 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 Clostridium difficile colitis   . Septicemia due to enterococcus     Past Surgical History  Procedure Laterality Date  . Av fistula placement    . Insertion of dialysis catheter  12/30/2010    Procedure: INSERTION OF DIALYSIS CATHETER;  Surgeon: Elam Dutch, MD;  Location: [place];  Service: Vascular;  Laterality: Left;  Insertion of left internal jugular dialysis catheter  . Vascular surgery    . Insertion of dialysis catheter  05/26/2011    Procedure: INSERTION OF DIALYSIS CATHETER;  Surgeon: Conrad Wainwright, MD;  Location: [place];  Service: Vascular;  Laterality: N/A;  Insertion tunneled dialysis catheter in Right Internal Jugular with 23 cm catheter   . Insertion of dialysis catheter  08/26/2011    Procedure: INSERTION OF DIALYSIS CATHETER;  Surgeon: Elam Dutch, MD;  Location: [facility] OR;  Service: Vascular;  Laterality: Right;  insertion of Right Femoral vein Dialysis catheter  . Tee without cardioversion  08/27/2011    Procedure: TRANSESOPHAGEAL ECHOCARDIOGRAM (TEE);  Surgeon: Pixie Casino, MD;  Location: [place];  Service: Cardiovascular;  Laterality: N/A;  . Av fistula placement  09/01/2011    Procedure: INSERTION OF ARTERIOVENOUS (AV) GORE-TEX GRAFT ARM;  Surgeon: Rosetta Posner, MD;  Location: [facility] OR;  Service: Vascular;  Laterality: Right;  Using 88mm x 50  stretch goretex graft  . Thrombectomy and revision of arterioventous (av) goretex  graft Right 02/13/2012    Procedure: THROMBECTOMY AND REVISION OF ARTERIOVENTOUS (AV) GORETEX  GRAFT;  Surgeon: Angelia Mould, MD;   Location: [place];  Service: Vascular;  Laterality: Right;  . Tee without cardioversion N/A 02/16/2012    Procedure: TRANSESOPHAGEAL ECHOCARDIOGRAM (TEE);  Surgeon: Fay Records, MD;  Location: [place];  Service: Cardiovascular;  Laterality: N/A;  Rm [id number]  . Avgg removal Right 03/29/2012    Procedure: REMOVAL OF ARTERIOVENOUS GORETEX GRAFT ([place]);  Surgeon: Rosetta Posner, MD;  Location: [place];  Service: Vascular;  Laterality: Right;  . Insertion of dialysis catheter N/A 03/29/2012    Procedure: INSERTION OF DIALYSIS CATHETER;  Surgeon: Rosetta Posner, MD;  Location: [place];  Service: Vascular;  Laterality: N/A;  Ultrasound guided  . Av fistula placement Left 05/03/2012    Procedure: INSERTION OF ARTERIOVENOUS (AV) GORE-TEX GRAFT THIGH USING 73mm X 70cm Gore-Tex Graft ;  Surgeon: Rosetta Posner, MD;  Location: [facility] OR;  Service: Vascular;  Laterality: Left;  . Transthoracic echocardiogram  08/27/2011    EF 35-40%, mild-moderate concetric hypertrophy of the LV, moderately reduced LV systolic function,  ergies:   Allergies  Allergen Reactions  . Penicillins Nausea And Vomiting and Rash     Medications: I have reviewed patients current medications as documented in Epic Anti-infectives   Start     Dose/Rate Route Frequency Ordered Stop   11/13/12 2000  levofloxacin (LEVAQUIN) IVPB 500 mg  Status:  Discontinued     500 mg 100 mL/hr over 60 Minutes Intravenous Every 48 hours 11/11/12 2011 11/11/12 2320   11/13/12 1200  vancomycin (VANCOCIN) IVPB 1000 mg/200 mL premix     1,000 mg 200 mL/hr over 60 Minutes Intravenous Every T-Th-Sa (Hemodialysis) 11/12/12 1434     11/12/12 2100  aztreonam (AZACTAM) 1 g in dextrose 5 % 50 mL IVPB     1 g 100 mL/hr over 30 Minutes Intravenous Every 24 hours 11/11/12 2011     11/12/12 1445  vancomycin (VANCOCIN) IVPB 1000 mg/200 mL premix     1,000 mg 200 mL/hr over 60 Minutes Intravenous  Once 11/12/12 1434     11/12/12 1200  lamiVUDine (EPIVIR) 10 MG/ML solution 50 mg      50 mg Oral Daily 11/11/12 2322     11/12/12 1200  abacavir (ZIAGEN) tablet 600 mg     600 mg Oral Daily 11/11/12 2322     11/12/12 0700  Darunavir Ethanolate (PREZISTA) tablet 800 mg     800 mg Oral Daily with breakfast 11/11/12 2322     11/12/12 0700  ritonavir (NORVIR) tablet 100 mg     100 mg Oral Daily with breakfast 11/11/12 2322     11/11/12 2030  vancomycin (VANCOCIN) IVPB 750 mg/150 ml premix     750 mg 150 mL/hr over 60 Minutes Intravenous  Once 11/11/12 2029 11/12/12  0053   11/11/12 1945  levofloxacin (LEVAQUIN) IVPB 750 mg     750 mg 100 mL/hr over 90 Minutes Intravenous  Once 11/11/12 1940 11/11/12 2200   11/11/12 1945  aztreonam (AZACTAM) 2 g in dextrose 5 % 50 mL IVPB     2 g 100 mL/hr over 30 Minutes Intravenous  Once 11/11/12 1940 11/11/12 2250   11/11/12 1945  vancomycin (VANCOCIN) IVPB 1000 mg/200 mL premix  Status:  Discontinued     1,000 mg 200 mL/hr over 60 Minutes Intravenous  Once 11/11/12 1940 11/12/12 1433      Social History:  reports that she has never smoked. She has never used smokeless tobacco. She reports that she uses illicit drugs (Cocaine). She reports that she does not drink alcohol.  Family History  Problem Relation Age of Onset  . Alcohol abuse      family h/o addiction/alcoholism  . Diabetes Cousin     first degree relatives  . Kidney disease Mother   . Kidney disease Maternal Uncle   . Cancer Sister     As in HPI and primary teams notes otherwise 12 point review of systems is negative  Blood pressure 124/57, pulse 92, temperature 99 F (37.2 C), temperature source Oral, resp. rate 20, height 5\' 8"  (1.727 m), weight 201 lb 1 oz (91.2 kg), SpO2 98.00%. General: Alert and awake, oriented x3, not in any acute distress. HEENT: anicteric sclera, EOMI, oropharynx clear and without exudate CVS regular rate, normal r, II/VI systolic murmur throughout precordium Chest: clear to auscultation bilaterally, no wheezing, rales or  rhonchi Abdomen: soft nontender, nondistended, normal bowel sounds, Extremities: 2_ edema Skin: her fistula site is NOT erythematous or tender to palpation Neuro: nonfocal, strength and sensation intact   Results for orders placed during the hospital encounter of 11/11/12 (from the past 48 hour(s))  CBC WITH DIFFERENTIAL     Status: Abnormal   Collection Time    11/11/12  6:55 PM      Result Value Range   WBC 2.9 (*) 4.0 - 10.5 K/uL   RBC 3.29 (*) 3.87 - 5.11 MIL/uL   Hemoglobin 12.0  12.0 - 15.0 g/dL   HCT 36.0  36.0 - 46.0 %   MCV 109.4 (*) 78.0 - 100.0 fL   MCH 36.5 (*) 26.0 - 34.0 pg   MCHC 33.3  30.0 - 36.0 g/dL   RDW 12.7  11.5 - 15.5 %   Platelets 139 (*) 150 - 400 K/uL   Neutrophils Relative % 32 (*) 43 - 77 %   Lymphocytes Relative 49 (*) 12 - 46 %   Monocytes Relative 16 (*) 3 - 12 %   Eosinophils Relative 1  0 - 5 %   Basophils Relative 2 (*) 0 - 1 %   Band Neutrophils 0  0 - 10 %   Metamyelocytes Relative 0     Myelocytes 0     Promyelocytes Absolute 0     Blasts 0     nRBC 0  0 /100 WBC   Neutro Abs 0.9 (*) 1.7 - 7.7 K/uL   Lymphs Abs 1.4  0.7 - 4.0 K/uL   Monocytes Absolute 0.5  0.1 - 1.0 K/uL   Eosinophils Absolute 0.0  0.0 - 0.7 K/uL   Basophils Absolute 0.1  0.0 - 0.1 K/uL   Smear Review MORPHOLOGY UNREMARKABLE    COMPREHENSIVE METABOLIC PANEL     Status: Abnormal   Collection Time    11/11/12  6:55  PM      Result Value Range   Sodium 133 (*) 135 - 145 mEq/L   Potassium 4.3  3.5 - 5.1 mEq/L   Chloride 92 (*) 96 - 112 mEq/L   CO2 28  19 - 32 mEq/L   Glucose, Bld 92  70 - 99 mg/dL   BUN 23  6 - 23 mg/dL   Creatinine, Ser 5.15 (*) 0.50 - 1.10 mg/dL   Calcium 10.3  8.4 - 10.5 mg/dL   Total Protein 9.4 (*) 6.0 - 8.3 g/dL   Albumin 3.8  3.5 - 5.2 g/dL   AST 293 (*) 0 - 37 U/L   ALT 215 (*) 0 - 35 U/L   Alkaline Phosphatase 259 (*) 39 - 117 U/L   Total Bilirubin 1.3 (*) 0.3 - 1.2 mg/dL   GFR calc non Af Amer 9 (*) >90 mL/min   GFR calc Af Amer 10 (*)  >90 mL/min   Comment: (NOTE)     The eGFR has been calculated using the CKD EPI equation.     This calculation has not been validated in all clinical situations.     eGFR's persistently <90 mL/min signify possible Chronic Kidney     Disease.  PATHOLOGIST SMEAR REVIEW     Status: None   Collection Time    11/11/12  6:55 PM      Result Value Range   Path Review Microcytic anemia     Comment: Neutropenia     Reviewed by Lennox Solders. Lyndon Code, M.D.     11/12/12  LACTIC ACID, PLASMA     Status: None   Collection Time    11/11/12  8:26 PM      Result Value Range   Lactic Acid, Venous 2.0  0.5 - 2.2 mmol/L  CG4 I-STAT (LACTIC ACID)     Status: None   Collection Time    11/11/12  8:39 PM      Result Value Range   Lactic Acid, Venous 1.96  0.5 - 2.2 mmol/L  MRSA PCR SCREENING     Status: None   Collection Time    11/11/12 11:15 PM      Result Value Range   MRSA by PCR NEGATIVE  NEGATIVE   Comment:            The GeneXpert MRSA Assay (FDA     approved for NASAL specimens     only), is one component of a     comprehensive MRSA colonization     surveillance program. It is not     intended to diagnose MRSA     infection nor to guide or     monitor treatment for     MRSA infections.  CBC WITH DIFFERENTIAL     Status: Abnormal   Collection Time    11/12/12  1:25 AM      Result Value Range   WBC 2.9 (*) 4.0 - 10.5 K/uL   RBC 3.06 (*) 3.87 - 5.11 MIL/uL   Hemoglobin 11.4 (*) 12.0 - 15.0 g/dL   HCT 33.9 (*) 36.0 - 46.0 %   MCV 110.8 (*) 78.0 - 100.0 fL   MCH 37.3 (*) 26.0 - 34.0 pg   MCHC 33.6  30.0 - 36.0 g/dL   RDW 12.6  11.5 - 15.5 %   Platelets 134 (*) 150 - 400 K/uL   Neutrophils Relative % 30 (*) 43 - 77 %   Lymphocytes Relative 47 (*) 12 - 46 %  Monocytes Relative 22 (*) 3 - 12 %   Eosinophils Relative 1  0 - 5 %   Basophils Relative 0  0 - 1 %   Neutro Abs 0.9 (*) 1.7 - 7.7 K/uL   Lymphs Abs 1.4  0.7 - 4.0 K/uL   Monocytes Absolute 0.6  0.1 - 1.0 K/uL   Eosinophils Absolute  0.0  0.0 - 0.7 K/uL   Basophils Absolute 0.0  0.0 - 0.1 K/uL  COMPREHENSIVE METABOLIC PANEL     Status: Abnormal   Collection Time    11/12/12  1:25 AM      Result Value Range   Sodium 130 (*) 135 - 145 mEq/L   Potassium 4.8  3.5 - 5.1 mEq/L   Chloride 92 (*) 96 - 112 mEq/L   CO2 26  19 - 32 mEq/L   Glucose, Bld 94  70 - 99 mg/dL   BUN 27 (*) 6 - 23 mg/dL   Creatinine, Ser 5.40 (*) 0.50 - 1.10 mg/dL   Calcium 9.8  8.4 - 10.5 mg/dL   Total Protein 8.7 (*) 6.0 - 8.3 g/dL   Albumin 3.3 (*) 3.5 - 5.2 g/dL   AST 240 (*) 0 - 37 U/L   ALT 180 (*) 0 - 35 U/L   Alkaline Phosphatase 229 (*) 39 - 117 U/L   Total Bilirubin 1.1  0.3 - 1.2 mg/dL   GFR calc non Af Amer 8 (*) >90 mL/min   GFR calc Af Amer 9 (*) >90 mL/min   Comment: (NOTE)     The eGFR has been calculated using the CKD EPI equation.     This calculation has not been validated in all clinical situations.     eGFR's persistently <90 mL/min signify possible Chronic Kidney     Disease.  BILIRUBIN, DIRECT     Status: None   Collection Time    11/12/12  1:25 AM      Result Value Range   Bilirubin, Direct 0.2  0.0 - 0.3 mg/dL  PROTIME-INR     Status: None   Collection Time    11/12/12  1:25 AM      Result Value Range   Prothrombin Time 14.8  11.6 - 15.2 seconds   INR 1.19  0.00 - 1.49      Component Value Date/Time   SDES WOUND RIGHT ARM 02/13/2012 1611   SDES WOUND RIGHT ARM 02/13/2012 1611   SPECREQUEST SWAB OF PERI GRAFT FLUID, PT ON ZYVOS 02/13/2012 1611   SPECREQUEST SWAB OF PERI GRAFT FLUID, PT ON ZYVOS 02/13/2012 1611   CULT NO GROWTH 2 DAYS 02/13/2012 1611   CULT NO ANAEROBES ISOLATED 02/13/2012 1611   REPTSTATUS 02/15/2012 FINAL 02/13/2012 1611   REPTSTATUS 02/20/2012 FINAL 02/13/2012 1611   Dg Chest Port 1 View  11/11/2012   CLINICAL DATA:  Fever. Short of breath. Dialysis patient.  EXAM: PORTABLE CHEST - 1 VIEW  COMPARISON:  03/29/2012.  FINDINGS: Apical lordotic projection. Cardiopericardial silhouette within normal limits.  Interval removal of right IJ dialysis catheter. No airspace disease or effusion. Surgical clips in the axilla bilaterally.  IMPRESSION: No active cardiopulmonary disease.   Electronically Signed   By: Dereck Ligas M.D.   On: 11/11/2012 18:01     Recent Results (from the past 720 hour(s))  MRSA PCR SCREENING     Status: None   Collection Time    11/11/12 11:15 PM      Result Value Range Status   MRSA by PCR  NEGATIVE  NEGATIVE Final   Comment:            The GeneXpert MRSA Assay (FDA     approved for NASAL specimens     only), is one component of a     comprehensive MRSA colonization     surveillance program. It is not     intended to diagnose MRSA     infection nor to guide or     monitor treatment for     MRSA infections.     Impression/Recommendation  57 yo with HIV, with ESRD on HD prior MSSA endocarditis, recurrent MSSA, MRSA bacteremias, recurrent CDI now admitted with fevers, septic picture.  #1 FUO, Sepsis: Agree with her broad coverage. Source is not clear at this point  --followup blood cultures, IF they are positive she will need TEE and VVS consult to evaluate her graft --I would see if urine can be collected by cath for culture --I would get CT chest abdomen and pelvis I spent greater than 45 minutes with the patient including greater than 50% of time in face to face counsel of the patient and in coordination of their care.    #2 HIV: She was perfectly suppressed in September can recheck labs though I prefer to do so in our clinic since VL have been mis-processed here at [facility] with falsely elevated HIV RNA in compliant pts  Dr. Megan Salon will be here over the weekend.    Thank you so much for this interesting consult  [place] [facility] [place] [phone] (pager) [phone] (office) 11/12/2012, 3:40 PM  Rhina [place] 11/12/2012, 3:40 PM

## 2012-11-12 NOTE — Progress Notes (Signed)
Clinical Social Work Department BRIEF PSYCHOSOCIAL ASSESSMENT 11/12/2012  Patient:  [name], [name]     Account Number:  [id number]     Admit date:  11/11/2012  Clinical Social Worker:  Valda Lamb  Date/Time:  11/12/2012 01:47 PM  Referred by:  Physician  Date Referred:  11/12/2012 Referred for  SNF Placement   Other Referral:   Interview type:  Patient Other interview type:    PSYCHOSOCIAL DATA Living Status:  FACILITY Admitted from facility:  [facility] Level of care:  [place] Primary support name:  Rayden Turro [id number] Primary support relationship to patient:  CHILD, ADULT Degree of support available:   Pt did not express amount of support available from family. No family or friends present in pt room.    CURRENT CONCERNS Current Concerns  Post-Acute Placement   Other Concerns:    SOCIAL WORK ASSESSMENT / PLAN Covering CSW informed by Levada Dy at [facility] that pt was admitted from their  facility. CSW viisted pt room and confirmed with pt that she is from [facility] and the plan is for her to return when medically ready.    Facility confirms pt is able to return to [facility] at discharge.   Assessment/plan status:  Psychosocial Support/Ongoing Assessment of Needs Other assessment/ plan:   Information/referral to community resources:   No resources needed at this time.    PATIENT'S/FAMILY'S RESPONSE TO PLAN OF CARE: Pt agreeable to return to [facility] at discharge. No concerns expressed.       Hunt Oris, MSW, [place]

## 2012-11-12 NOTE — ED Provider Notes (Signed)
Medical screening examination/treatment/procedure(s) were performed by non-physician practitioner and as supervising physician I was immediately available for consultation/collaboration.  EKG Interpretation     Ventricular Rate:    PR Interval:    QRS Duration:   QT Interval:    QTC Calculation:   R Axis:     Text Interpretation:               Merryl Hacker, MD 11/12/12 1445

## 2012-11-12 NOTE — Progress Notes (Signed)
Nurse called report for pt to [facility] on 6E.  Pt was transported to new room alert and oriented with belongings at bedside.

## 2012-11-12 NOTE — Progress Notes (Signed)
Utilization Review Completed.[name] T11/07/2012

## 2012-11-12 NOTE — Consult Note (Signed)
[facility] Renal Consultation Note    Indication for Consultation:  Management of ESRD/hemodialysis; anemia, hypertension/volume and secondary hyperparathyroidism  HPI: [name] is a 57 y.o. female ESRD patient (TTS HD) with past medical history significant for HIV, recurrent bacteremia, hypotension, and systolic CHF who was noted to be febrile at 104 after dialysis on Thursday with only slight improvement to 102 after the administration of Tylenol. She was subsequently transported to the ED from [facility] given her complex infectious disease history. At the time of this encounter, she is sitting up in bed with no complaints. She states that she has not felt bad at all since the onset of the fever and denies any pain, GI upset, cough, chills, malaise or open wounds. There is a small amount of yellow emesis in a basin at the bedside which she stated resulted from the administration of some medicine given here. She denies nausea.   Her last HD session was on Thursday where she completed about 3:30 hours of her 4 hr treatment, leaving about 1 kg above her dry weight.  She says that she has been eating better and gaining a lot of weight but denies issues with treatment.  Past Medical History  Diagnosis Date  . Human immunodeficiency virus (HIV) disease   . Hypertension   . Clostridium difficile infection 04/04/10    07/2011 and 08/2011  . Bacteriuria, asymptomatic 04/04/10    Culture grew VRE sensitive to linesolid   . MGUS (monoclonal gammopathy of unknown significance)   . History of bacteremia     MSSA; 05/2011, 08/2011  . Anemia   . Systolic heart failure     08/27/11 EF 35-40%  . Cancer   . ESRD (end stage renal disease) 03/19/2007    Started HD in 2010.  ESRD was due to HIV per pt. Gets HD on [employer] at [facility] on TTS schedule.     . Peripheral vascular disease   . Endocarditis     MSSA  . 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 Clostridium difficile colitis   . Septicemia due to enterococcus    Past Surgical History  Procedure Laterality Date  . Av fistula placement    . Insertion of dialysis catheter  12/30/2010    Procedure: INSERTION OF DIALYSIS CATHETER;  Surgeon: Elam Dutch, MD;  Location: [place];  Service: Vascular;  Laterality: Left;  Insertion of left internal jugular dialysis catheter  . Vascular surgery    . Insertion of dialysis catheter  05/26/2011    Procedure: INSERTION OF DIALYSIS CATHETER;  Surgeon: Conrad [place], MD;  Location: [place];  Service: Vascular;  Laterality: N/A;  Insertion tunneled dialysis catheter in Right Internal Jugular with 23 cm catheter   . Insertion of dialysis catheter  08/26/2011    Procedure: INSERTION OF DIALYSIS CATHETER;  Surgeon: Elam Dutch, MD;  Location: [facility] OR;  Service: Vascular;  Laterality: Right;  insertion of Right Femoral vein Dialysis catheter  . Tee without cardioversion  08/27/2011    Procedure: TRANSESOPHAGEAL ECHOCARDIOGRAM (TEE);  Surgeon: Pixie Casino, MD;  Location: [place];  Service: Cardiovascular;  Laterality: N/A;  . Av fistula placement  09/01/2011    Procedure: INSERTION OF ARTERIOVENOUS (AV) GORE-TEX GRAFT ARM;  Surgeon: Rosetta Posner, MD;  Location: [facility] OR;  Service: Vascular;  Laterality: Right;  Using 59mm x 50  stretch goretex graft  . Thrombectomy  and revision of arterioventous (av) goretex  graft Right 02/13/2012    Procedure: THROMBECTOMY AND REVISION OF ARTERIOVENTOUS (AV) GORETEX  GRAFT;  Surgeon: Angelia Mould, MD;  Location: [place];  Service: Vascular;  Laterality: Right;  . Tee without cardioversion N/A 02/16/2012    Procedure: TRANSESOPHAGEAL ECHOCARDIOGRAM (TEE);  Surgeon: Fay Records, MD;  Location: [place];  Service: Cardiovascular;  Laterality: N/A;  Rm 6710  . Avgg removal Right 03/29/2012    Procedure: REMOVAL OF ARTERIOVENOUS GORETEX GRAFT ([place]);  Surgeon: Rosetta Posner, MD;  Location: [place];   Service: Vascular;  Laterality: Right;  . Insertion of dialysis catheter N/A 03/29/2012    Procedure: INSERTION OF DIALYSIS CATHETER;  Surgeon: Rosetta Posner, MD;  Location: [place];  Service: Vascular;  Laterality: N/A;  Ultrasound guided  . Av fistula placement Left 05/03/2012    Procedure: INSERTION OF ARTERIOVENOUS (AV) GORE-TEX GRAFT THIGH USING 46mm X 70cm Gore-Tex Graft ;  Surgeon: Rosetta Posner, MD;  Location: [facility] OR;  Service: Vascular;  Laterality: Left;  . Transthoracic echocardiogram  08/27/2011    EF 35-40%, mild-moderate concetric hypertrophy of the LV, moderately reduced LV systolic function,   Family History  Problem Relation Age of Onset  . Alcohol abuse      family h/o addiction/alcoholism  . Diabetes Cousin     first degree relatives  . Kidney disease Mother   . Kidney disease Maternal Uncle   . Cancer Sister    Social History:  reports that she has never smoked. She has never used smokeless tobacco. She reports that she uses illicit drugs (Cocaine). She reports that she does not drink alcohol. Allergies  Allergen Reactions  . Penicillins Nausea And Vomiting and Rash   Prior to Admission medications   Medication Sig Start Date End Date Taking? Authorizing Provider  abacavir (ZIAGEN) 300 MG tablet Take 600 mg by mouth daily at 12 noon.    Yes Historical Provider, MD  aspirin 81 MG chewable tablet Chew 81 mg by mouth daily at 12 noon.    Yes Historical Provider, MD  b complex vitamins tablet Take 1 tablet by mouth daily at 12 noon.    Yes Historical Provider, MD  calcium acetate (PHOSLO) 667 MG capsule Take 2 capsules (1,334 mg total) by mouth 3 (three) times daily with meals. 02/17/12  Yes Jerene Pitch, MD  Darunavir Ethanolate (PREZISTA) 800 MG tablet Take 1 tablet (800 mg total) by mouth daily with breakfast. 09/02/11  Yes Cresenciano Genre, MD  ethyl chloride spray Apply 1 application topically 3 (three) times a week. To graft for dialysis Tuesday, Thursday and Saturday   Yes  Historical Provider, MD  guaifenesin (ROBITUSSIN) 100 MG/5ML syrup Take 200 mg by mouth every 6 (six) hours as needed for cough or congestion.   Yes Historical Provider, MD  lamiVUDine (EPIVIR) 10 MG/ML solution Take 50 mg by mouth daily at 12 noon.   Yes Historical Provider, MD  midodrine (PROAMATINE) 10 MG tablet Take 10 mg by mouth 2 (two) times daily. At 630am and 10pm   Yes Historical Provider, MD  Multiple Vitamins-Minerals (CERTAGEN PO) Take 1 tablet by mouth daily at 12 noon.   Yes Historical Provider, MD  oxyCODONE (OXY IR/ROXICODONE) 5 MG immediate release tablet Take 5 mg by mouth every 6 (six) hours as needed (for pain).   Yes Historical Provider, MD  ritonavir (NORVIR) 100 MG TABS Take 1 tablet (100 mg total) by mouth daily with breakfast. 09/02/11  Yes Cresenciano Genre, MD   Current Facility-Administered Medications  Medication Dose Route Frequency Provider Last Rate Last Dose  . abacavir (ZIAGEN) tablet 600 mg  600 mg Oral Q1200 Othella Boyer, MD   600 mg at 11/12/12 1209  . aspirin chewable tablet 81 mg  81 mg Oral Q1200 Neema K Burnard Bunting, MD   81 mg at 11/12/12 1210  . aztreonam (AZACTAM) 1 g in dextrose 5 % 50 mL IVPB  1 g Intravenous Q24H Dareen Piano, [facility]      . calcium acetate (PHOSLO) capsule 1,334 mg  1,334 mg Oral TID WC Othella Boyer, MD   1,334 mg at 11/12/12 1209  . Darunavir Ethanolate (PREZISTA) tablet 800 mg  800 mg Oral Q breakfast Othella Boyer, MD   800 mg at 11/12/12 0844  . heparin injection 5,000 Units  5,000 Units Subcutaneous Q8H Othella Boyer, MD   5,000 Units at 11/12/12 0552  . lamiVUDine (EPIVIR) 10 MG/ML solution 50 mg  50 mg Oral Q1200 Othella Boyer, MD   50 mg at 11/12/12 1209  . midodrine (PROAMATINE) tablet 10 mg  10 mg Oral BID Othella Boyer, MD   10 mg at 11/12/12 0554  . oxyCODONE (Oxy IR/ROXICODONE) immediate release tablet 5 mg  5 mg Oral Q6H PRN Othella Boyer, MD      . ritonavir (NORVIR) tablet 100 mg  100 mg Oral Q breakfast Othella Boyer, MD   100 mg at 11/12/12 0844  . sodium chloride 0.9 % injection 3 mL  3 mL Intravenous Q12H Neema Bobbie Stack, MD   3 mL at 11/12/12 1000  . vancomycin (VANCOCIN) IVPB 1000 mg/200 mL premix  1,000 mg Intravenous Once Harlow Mares, PA-C       Labs: Basic Metabolic Panel:  Recent Labs Lab 11/11/12 1855 11/12/12 0125  NA 133* 130*  K 4.3 4.8  CL 92* 92*  CO2 28 26  GLUCOSE 92 94  BUN 23 27*  CREATININE 5.15* 5.40*  CALCIUM 10.3 9.8   Liver Function Tests:  Recent Labs Lab 11/11/12 1855 11/12/12 0125  AST 293* 240*  ALT 215* 180*  ALKPHOS 259* 229*  BILITOT 1.3* 1.1  PROT 9.4* 8.7*  ALBUMIN 3.8 3.3*   CBC:  Recent Labs Lab 11/11/12 1855 11/12/12 0125  WBC 2.9* 2.9*  NEUTROABS 0.9* 0.9*  HGB 12.0 11.4*  HCT 36.0 33.9*  MCV 109.4* 110.8*  PLT 139* 134*   Studies/Results: Dg Chest Port 1 View  11/11/2012   CLINICAL DATA:  Fever. Short of breath. Dialysis patient.  EXAM: PORTABLE CHEST - 1 VIEW  COMPARISON:  03/29/2012.  FINDINGS: Apical lordotic projection. Cardiopericardial silhouette within normal limits. Interval removal of right IJ dialysis catheter. No airspace disease or effusion. Surgical clips in the axilla bilaterally.  IMPRESSION: No active cardiopulmonary disease.   Electronically Signed   By: Dereck Ligas M.D.   On: 11/11/2012 18:01    ROS: 10 pt ROS asked and answered. All systems negative except per HPI above.   Physical Exam: Filed Vitals:   11/12/12 0500 11/12/12 0827 11/12/12 1151 11/12/12 1211  BP:  92/50 83/47 99/51   Pulse:  59 61   Temp:  98.7 F (37.1 C) 99 F (37.2 C)   TempSrc:  Oral Oral   Resp:  13 15   Height:      Weight: 91.2 kg (201 lb 1 oz)     SpO2:  99% 98%  General: Elderly, well nourished, in no acute distress. Head: Normocephalic, atraumatic, sclera non-icteric, mucus membranes are moist Neck: Supple. JVD not elevated. Lungs: Clear bilaterally to auscultation without wheezes, rales, or rhonchi.  Breathing is unlabored. Crackles R base Heart: RRR , 2/6 murmur LUSB, No rubs or gallops appreciated. Abdomen: Soft, non-tender, non-distended with normoactive bowel sounds. No rebound/guarding. No obvious abdominal masses. M-S:  Strength and tone appear normal for age. Lower extremities: + edema bilat. No ischemic changes, no open wounds Neuro: Alert and oriented X 3. Moves all extremities spontaneously. Psych:  Responds to questions appropriately with a normal affect. Dialysis Access: L thigh AVG + bruit with no overt signs of infection.  Dialysis Orders: [facility]  on T[facility] . EDW 87.5 kg HD Bath 2K/2.25 Ca  Time 4:00 Heparin 5900. Access L thigh AVG BFR 400 DFR A1.5    Hectorol 6 mcg IV/HD Epogen 0  Units IV/HD  Venofer  50 mg IV q week  Recent labs: Hgb 11.9, Tsat 49%, Ferr 693, P 5.6, PTH 205  Assessment/Plan: 1. Neutropenic Fever/ SIRS - Per primary. Tmax 104 , now improving. No clear source of infection. Empiric Vanc and Aztreonam. Blood culture results pending. Hx of endocarditis. Plan repeat TEE if BCs are positive. ID consult pending. 2. Transaminitis - AST and ALT elevated on admit with slight downward trend on repeat labs this a.m. Possible RUQ U/S if no further improvement. Per primary. 3. ESRD -  TTS, K+ 4.8, HD tomorrow 4. HyPOtension/volume  - SBPs 80s - 90s. On midodrine BID. Up about 4kg by weights s/p gentle IVF. Gaining wgt outpatient with several recent increases to EDW per op notes. Post wgt 88.4 on 11/6. Mild pulm vasc congestion on CXR. UF 3-4L as tolerated. 5. Anemia  - Hgb 11.4 < 12. No op ESAs. Last Tsat 49% with Ferr 693. Weekly venofer 50mg  held for now - last dose was 11/6. Follow CBC 6. Metabolic bone disease -  Ca 9.8 (10.4 corrected). Last P 5.6 on Phoslo 2 ac. PTH within range. Cont binders and Vit d. Low Ca bath for now. 7. Nutrition - Alb 3.3. Renal diet, multivitamin 8. HIV - Continue home meds. Last CD4 320 in September.   Sonnie Alamo, PA-C [facility] Pager [phone] 11/12/2012, 12:56 PM   I have seen and examined patient, discussed with PA and agree with assessment and plan as outlined above. Kelly Splinter MD pager [phone]    cell [phone] 11/12/2012, 3:17 PM

## 2012-11-13 DIAGNOSIS — D631 Anemia in chronic kidney disease: Secondary | ICD-10-CM

## 2012-11-13 LAB — CBC WITH DIFFERENTIAL/PLATELET
Basophils Absolute: 0 10*3/uL (ref 0.0–0.1)
Basophils Relative: 1 % (ref 0–1)
Eosinophils Absolute: 0.1 10*3/uL (ref 0.0–0.7)
Eosinophils Relative: 3 % (ref 0–5)
HCT: 33.4 % — ABNORMAL LOW (ref 36.0–46.0)
Hemoglobin: 11.5 g/dL — ABNORMAL LOW (ref 12.0–15.0)
Lymphocytes Relative: 45 % (ref 12–46)
Lymphs Abs: 1.2 10*3/uL (ref 0.7–4.0)
MCH: 37.6 pg — ABNORMAL HIGH (ref 26.0–34.0)
MCHC: 34.4 g/dL (ref 30.0–36.0)
MCV: 109.2 fL — ABNORMAL HIGH (ref 78.0–100.0)
Monocytes Absolute: 0.3 10*3/uL (ref 0.1–1.0)
Monocytes Relative: 11 % (ref 3–12)
Neutro Abs: 1.1 10*3/uL — ABNORMAL LOW (ref 1.7–7.7)
Neutrophils Relative %: 40 % — ABNORMAL LOW (ref 43–77)
Platelets: 143 10*3/uL — ABNORMAL LOW (ref 150–400)
RBC: 3.06 MIL/uL — ABNORMAL LOW (ref 3.87–5.11)
RDW: 12.1 % (ref 11.5–15.5)
WBC: 2.8 10*3/uL — ABNORMAL LOW (ref 4.0–10.5)

## 2012-11-13 LAB — COMPREHENSIVE METABOLIC PANEL
ALT: 127 U/L — ABNORMAL HIGH (ref 0–35)
AST: 152 U/L — ABNORMAL HIGH (ref 0–37)
Albumin: 3.2 g/dL — ABNORMAL LOW (ref 3.5–5.2)
Alkaline Phosphatase: 219 U/L — ABNORMAL HIGH (ref 39–117)
BUN: 52 mg/dL — ABNORMAL HIGH (ref 6–23)
CO2: 24 mEq/L (ref 19–32)
Calcium: 9.5 mg/dL (ref 8.4–10.5)
Chloride: 91 mEq/L — ABNORMAL LOW (ref 96–112)
Creatinine, Ser: 8.46 mg/dL — ABNORMAL HIGH (ref 0.50–1.10)
GFR calc Af Amer: 5 mL/min — ABNORMAL LOW (ref >90)
GFR calc non Af Amer: 5 mL/min — ABNORMAL LOW (ref >90)
Glucose, Bld: 120 mg/dL — ABNORMAL HIGH (ref 70–99)
Potassium: 4.6 mEq/L (ref 3.5–5.1)
Sodium: 130 mEq/L — ABNORMAL LOW (ref 135–145)
Total Bilirubin: 0.7 mg/dL (ref 0.3–1.2)
Total Protein: 8.6 g/dL — ABNORMAL HIGH (ref 6.0–8.3)

## 2012-11-13 LAB — HEPATITIS B SURFACE ANTIGEN: Hepatitis B Surface Ag: NEGATIVE

## 2012-11-13 LAB — PHOSPHORUS: Phosphorus: 5.3 mg/dL — ABNORMAL HIGH (ref 2.3–4.6)

## 2012-11-13 MED ORDER — DOXERCALCIFEROL 4 MCG/2ML IV SOLN
INTRAVENOUS | Status: AC
  Filled 2012-11-13: qty 2

## 2012-11-13 MED ORDER — MIDODRINE HCL 5 MG PO TABS
ORAL_TABLET | ORAL | Status: AC
  Filled 2012-11-13: qty 2

## 2012-11-13 NOTE — Progress Notes (Signed)
Patient ID: [name], female   DOB: [birth year], 56 y.o.   MRN: [id number]         [facility]    Date of Admission:  11/11/2012           Day 3 vancomycin        Day 3 aztreonam Principal Problem:   Neutropenic fever Active Problems:   HIV INFECTION   ANEMIA OF CHRONIC DISEASE   history of pancytopenia - BM bx suggestive of HIV etiology   ESRD (end stage renal disease)   Hypotension, intermittent esp after HD   Transaminitis   . abacavir  600 mg Oral Q1200  . aspirin  81 mg Oral Q1200  . aztreonam  1 g Intravenous Q24H  . calcium acetate  1,334 mg Oral TID WC  . Darunavir Ethanolate  800 mg Oral Q breakfast  . doxercalciferol      . doxercalciferol      . doxercalciferol  6 mcg Intravenous Q T,Th,Sa-HD  . heparin  5,000 Units Subcutaneous Q8H  . lamiVUDine  50 mg Oral Q1200  . midodrine  10 mg Oral BID  . ritonavir  100 mg Oral Q breakfast  . sodium chloride  3 mL Intravenous Q12H  . vancomycin  1,000 mg Intravenous Q T,Th,Sa-HD    Subjective: She denies any problems and was unaware that she was febrile on admission. Denies any headache, sore throat, no cough, no shortness of breath, pain, nausea, vomiting, diarrhea or other new problems.  Past Medical History  Diagnosis Date  . Human immunodeficiency virus (HIV) disease   . Hypertension   . Clostridium difficile infection 04/04/10    07/2011 and 08/2011  . Bacteriuria, asymptomatic 04/04/10    Culture grew VRE sensitive to linesolid   . MGUS (monoclonal gammopathy of unknown significance)   . History of bacteremia     MSSA; 05/2011, 08/2011  . Anemia   . Systolic heart failure     08/27/11 EF 35-40%  . Cancer   . ESRD (end stage renal disease) 03/19/2007    Started HD in 2010.  ESRD was due to HIV per pt. Gets HD on [employer] at [facility] on TTS schedule.     . Peripheral vascular disease   . Endocarditis     MSSA  . 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 Clostridium difficile colitis   . Septicemia due to enterococcus     History  Substance Use Topics  . Smoking status: Never Smoker   . Smokeless tobacco: Never Used  . Alcohol Use: No    Family History  Problem Relation Age of Onset  . Alcohol abuse      family h/o addiction/alcoholism  . Diabetes Cousin     first degree relatives  . Kidney disease Mother   . Kidney disease Maternal Uncle   . Cancer Sister     Allergies  Allergen Reactions  . Penicillins Nausea And Vomiting and Rash    Objective: Temp:  [97.6 F (36.4 C)-98.7 F (37.1 C)] 98 F (36.7 C) (11/08 1432) Pulse Rate:  [53-99] 71 (11/08 1432) Resp:  [13-20] 17 (11/08 1432) BP: (68-156)/(22-106) 68/42 mmHg (11/08 1432) SpO2:  [92 %-100 %] 98 % (11/08 1432) Weight:  [92 kg (202 lb 13.2 oz)-94 kg (207 lb 3.7 oz)] 94 kg (207 lb 3.7 oz) (11/08 0903)  General: She is comfortable, watching television in bed  Skin: No rash Lungs: Clear Cor: Regular S1 and S2 with a 2/6 systolic murmur Abdomen: Soft and nontender Left thigh graft: Normal without evidence of infection  Lab Results Lab Results  Component Value Date   WBC 2.8* 11/13/2012   HGB 11.5* 11/13/2012   HCT 33.4* 11/13/2012   MCV 109.2* 11/13/2012   PLT 143* 11/13/2012    Lab Results  Component Value Date   CREATININE 8.46* 11/13/2012   BUN 52* 11/13/2012   NA 130* 11/13/2012   K 4.6 11/13/2012   CL 91* 11/13/2012   CO2 24 11/13/2012    Lab Results  Component Value Date   ALT 127* 11/13/2012   AST 152* 11/13/2012   ALKPHOS 219* 11/13/2012   BILITOT 0.7 11/13/2012      Microbiology: Recent Results (from the past 240 hour(s))  CULTURE, BLOOD (ROUTINE X 2)     Status: None   Collection Time    11/11/12  6:40 PM      Result Value Range Status   Specimen Description BLOOD FOREARM LEFT   Final   Special Requests BOTTLES DRAWN AEROBIC ONLY 8CC   Final   Culture  Setup Time     Final   Value: 11/12/2012 03:05     Performed at [facility]   Culture     Final   Value:        BLOOD CULTURE RECEIVED NO GROWTH TO DATE CULTURE WILL BE HELD FOR 5 DAYS BEFORE ISSUING A FINAL NEGATIVE REPORT     Performed at [facility]   Report Status PENDING   Incomplete  CULTURE, BLOOD (ROUTINE X 2)     Status: None   Collection Time    11/11/12  6:53 PM      Result Value Range Status   Specimen Description BLOOD ARM LEFT   Final   Special Requests BOTTLES DRAWN AEROBIC AND ANAEROBIC 10CC   Final   Culture  Setup Time     Final   Value: 11/12/2012 03:04     Performed at [facility]   Culture     Final   Value:        BLOOD CULTURE RECEIVED NO GROWTH TO DATE CULTURE WILL BE HELD FOR 5 DAYS BEFORE ISSUING A FINAL NEGATIVE REPORT     Performed at [facility]   Report Status PENDING   Incomplete  MRSA PCR SCREENING     Status: None   Collection Time    11/11/12 11:15 PM      Result Value Range Status   MRSA by PCR NEGATIVE  NEGATIVE Final   Comment:            The GeneXpert MRSA Assay (FDA     approved for NASAL specimens     only), is one component of a     comprehensive MRSA colonization     surveillance program. It is not     intended to diagnose MRSA     infection nor to guide or     monitor treatment for     MRSA infections.    Studies/Results: Dg Chest Port 1 View  11/11/2012   CLINICAL DATA:  Fever. Short of breath. Dialysis patient.  EXAM: PORTABLE CHEST - 1 VIEW  COMPARISON:  03/29/2012.  FINDINGS: Apical lordotic projection. Cardiopericardial silhouette within normal limits. Interval removal of right IJ dialysis catheter. No airspace disease or effusion. Surgical clips in the axilla bilaterally.  IMPRESSION: No active cardiopulmonary disease.  Electronically Signed   By: Dereck Ligas M.D.   On: 11/11/2012 18:01    Assessment: So far blood cultures are negative and there is no obvious source for her recent fevers and hypotension. I suspect that her elevated hepatic transaminases are  related to hypotension and not her HIV medications or viral hepatitis. She is improving so would continue empiric antibiotics for now.  Plan: 1. Continue vancomycin and aztreonam pending final cultures and further observation  Michel Bickers, MD [facility] [phone] pager   [phone] cell 11/13/2012, 3:59 PM

## 2012-11-13 NOTE — Progress Notes (Signed)
Subjective: [name] is a 57 year old female with PMH of ESRD on HD TTS, Pancytopenia (baseline Hgb ~10), HIV (CD4 320, undect viral load on 09/13/12), and multiple hospitalizations for fever and bacteremia, found to be febrile to 104 at HD on 11/11/12.   Patient seen at bedside this AM. Sitting up eating breakfast, w/ no complaints. Denies any chest pain, SOB, fever, chills, nausea, vomiting, or abdominal pain.   For HD today.  Objective: Vital signs in last 24 hours: Filed Vitals:   11/13/12 0903 11/13/12 0909 11/13/12 0930 11/13/12 1000  BP: 79/54 103/52 90/47 82/41   Pulse: 73  65 60  Temp: 98.3 F (36.8 C)     TempSrc: Oral     Resp: 14 15 15 16   Height:      Weight: 207 lb 3.7 oz (94 kg)     SpO2: 96%      Weight change: 1 lb 12.2 oz (0.8 kg) No intake or output data in the 24 hours ending 11/13/12 1010 Physical Exam: General: Alert, cooperative, and in no apparent distress HEENT: Vision grossly intact, oropharynx clear and non-erythematous  Neck: Full range of motion without pain, supple, no lymphadenopathy or carotid bruits Lungs: Clear to ascultation bilaterally, normal work of respiration, no wheezes, rales, ronchi Heart: Regular rate and rhythm, 2/6 systolic murmur, no gallops, or rubs Abdomen: Soft, non-tender, non-distended, normal bowel sounds Extremities: No cyanosis, clubbing, or edema. Multiple scars in UE's from previous grafts in the past. AVG in left thigh w/ thrill and bruit. Neurologic: Alert & oriented X3, cranial nerves II-XII intact, strength grossly intact, sensation intact to light touch  Lab Results: Basic Metabolic Panel:  Recent Labs Lab 11/11/12 1855 11/12/12 0125  NA 133* 130*  K 4.3 4.8  CL 92* 92*  CO2 28 26  GLUCOSE 92 94  BUN 23 27*  CREATININE 5.15* 5.40*  CALCIUM 10.3 9.8   Liver Function Tests:  Recent Labs Lab 11/11/12 1855 11/12/12 0125  AST 293* 240*  ALT 215* 180*  ALKPHOS 259* 229*  BILITOT 1.3* 1.1  PROT 9.4*  8.7*  ALBUMIN 3.8 3.3*   CBC:  Recent Labs Lab 11/12/12 0125 11/13/12 0748  WBC 2.9* 2.8*  NEUTROABS 0.9* 1.1*  HGB 11.4* 11.5*  HCT 33.9* 33.4*  MCV 110.8* 109.2*  PLT 134* 143*   Coagulation:  Recent Labs Lab 11/12/12 0125  LABPROT 14.8  INR 1.19    Micro Results: Recent Results (from the past 240 hour(s))  CULTURE, BLOOD (ROUTINE X 2)     Status: None   Collection Time    11/11/12  6:40 PM      Result Value Range Status   Specimen Description BLOOD FOREARM LEFT   Final   Special Requests BOTTLES DRAWN AEROBIC ONLY 8CC   Final   Culture  Setup Time     Final   Value: 11/12/2012 03:05     Performed at [facility]   Culture     Final   Value:        BLOOD CULTURE RECEIVED NO GROWTH TO DATE CULTURE WILL BE HELD FOR 5 DAYS BEFORE ISSUING A FINAL NEGATIVE REPORT     Performed at [facility]   Report Status PENDING   Incomplete  CULTURE, BLOOD (ROUTINE X 2)     Status: None   Collection Time    11/11/12  6:53 PM      Result Value Range Status   Specimen Description BLOOD ARM  LEFT   Final   Special Requests BOTTLES DRAWN AEROBIC AND ANAEROBIC 10CC   Final   Culture  Setup Time     Final   Value: 11/12/2012 03:04     Performed at [facility]   Culture     Final   Value:        BLOOD CULTURE RECEIVED NO GROWTH TO DATE CULTURE WILL BE HELD FOR 5 DAYS BEFORE ISSUING A FINAL NEGATIVE REPORT     Performed at [facility]   Report Status PENDING   Incomplete  MRSA PCR SCREENING     Status: None   Collection Time    11/11/12 11:15 PM      Result Value Range Status   MRSA by PCR NEGATIVE  NEGATIVE Final   Comment:            The GeneXpert MRSA Assay (FDA     approved for NASAL specimens     only), is one component of a     comprehensive MRSA colonization     surveillance program. It is not     intended to diagnose MRSA     infection nor to guide or     monitor treatment for     MRSA infections.   Studies/Results: Dg Chest Port  1 View  11/11/2012   CLINICAL DATA:  Fever. Short of breath. Dialysis patient.  EXAM: PORTABLE CHEST - 1 VIEW  COMPARISON:  03/29/2012.  FINDINGS: Apical lordotic projection. Cardiopericardial silhouette within normal limits. Interval removal of right IJ dialysis catheter. No airspace disease or effusion. Surgical clips in the axilla bilaterally.  IMPRESSION: No active cardiopulmonary disease.   Electronically Signed   By: Dereck Ligas M.D.   On: 11/11/2012 18:01   Medications: I have reviewed the patient's current medications. Scheduled Meds: . abacavir  600 mg Oral Q1200  . aspirin  81 mg Oral Q1200  . aztreonam  1 g Intravenous Q24H  . calcium acetate  1,334 mg Oral TID WC  . Darunavir Ethanolate  800 mg Oral Q breakfast  . doxercalciferol  6 mcg Intravenous Q T,Th,Sa-HD  . heparin  5,000 Units Subcutaneous Q8H  . lamiVUDine  50 mg Oral Q1200  . midodrine  10 mg Oral BID  . ritonavir  100 mg Oral Q breakfast  . sodium chloride  3 mL Intravenous Q12H  . vancomycin  1,000 mg Intravenous Q T,Th,Sa-HD   Continuous Infusions:  PRN Meds:.oxyCODONE  Assessment/Plan: [name] is a 57 year old female with PMH of ESRD on HD TTS, Pancytopenia (baseline Hgb ~10), HIV (CD4 320, undect viral load on 09/13/12), and multiple hospitalizations for fever and bacteremia, found to be febrile to 104 at HD on 11/11/12.   Patient with extensive ID history in the past as follows: 02/11/12 --> MRSA Bacteremia with HCAP, blood cx 2/6, leukocytosis (10.9), Tm 104 (TEE - no vegetations, vascular revised graft - not infected) --> treated with Vanc, Cefepime, Zyvox, Ancef --> Vanc x 6 weeks 12/19/11 --> HCAP with possible bacteremia, blood cx 1/4 gram + cocci in pairs & chains (ID thought contaminant), normal wbc, Tm 105.8 --> treated with levaquin, aztreonam, daptomycin (given VRE hx), Vanc  08/21/11 --> VRE bacteremia & C.diff, 4/4 blood cx & right chest HD catheter tip + enterococcus 80K colonies, WBC 2.8, Abs  Neut 1.6, Tm 103, TEE with small calcified mobile mass on chordae tendinae --> treated with Rocephin, vanc, zyvoxx, Daptomycin --> Dapto x 2 weeks  07/11/11 --> C diff with Tm 103.6, WBC 1.6  05/18/11 --> Bacteremia (likely d/t HD catheter) with bacterial endocarditis & HCAP --> Vanc/Zosyn then Ancef x 6 weeks  12/23/10 --> MSSA bacteremia with RLL PNA --> Ancef x 6 weeks  Sepsis w/ unknown origin- Patient came from HD center with fever of 104. NO cultures drawn at dialysis center. Meets SIRS criteria w/ fever and WBC's of 2.9. At this time there is no obvious source of infection. CXR portable 1 view shows no sign of respiratory source and patient denies cough, patient does not make urine, no signs of obvious infection at fistula site, no HA, AMS, or stiff neck, no diarrhea. Lactic acid was wnl on admission. Blood cultures drawn, started on IV Vancomycin, and Aztreonam.  -ID consulted, appreciate recommendations. 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 Suggested CT chest abdomen pelvis, will consider if fever continues. -No change in WBC's overnight. Afebrile since admission.  Hypotension- Presented to ED w/ BP of 65/25. Improved after patient was bolused w/ 500 cc. Has had multiple UE vascular surgeries. BP also low throughout the day. After cuff was switched to right thigh, pressure was 124/57 yesterday. Still with some low BP this AM.  -Continue Midodrine -Continue to monitor pressures on thigh if necessary, will bolus if hypotensive.   Transaminitis- AST 25 in May 2014. AST and ALT elevated to ~5 times upper limit on admission. Patient has no history of Hep B or C. She does have a history of gallstones but does not currently have any abdominal pain. -Repeat LFT's this AM continue to trend down. Will continue to follow. If still elevated in AM, will consider imaging, either CT (as suggested by ID) or RUQ [place].  -Per ID note,  still a possibility her HIV meds are responsible for her elevated LFTs.  History of Endocarditis- MSSA -Consider TEE if BCx +.  - Patient does have a systolic murmur that was present on previous admissions (at least as early as Dec 2012), no noted MR, AS, or PS on TEE in February 2014.  HIV -CD4 320 in September with undect viral load  -Continue home HIV medications   Pancytopenia (chronic) -Chronic, patient did have a bone marrow Bx in 2012 that was hypercellular thought likely due to uncontrolled HIV. However last CBC in September had WBC of 4.1, mild anemia, and platelets of 170.  -At Baseline Hgb (11-12), MCV is 109. Leukopenia 2.9, Thrombocytopenia at 139.  ESRD (end stage renal disease) on HD TTS- Nephrology following. HD today.  Dispo: Disposition is deferred at this time, awaiting improvement of current medical problems.  Anticipated discharge in approximately 2-3 day(s).   The patient does have a current PCP Ivin Poot, MD) and does need an [facility] hospital follow-up appointment after discharge.  The patient does not have transportation limitations that hinder transportation to clinic appointments.  .Services Needed at time of discharge: Y = Yes, Blank = No PT:   OT:   RN:   Equipment:   Other:     LOS: 2 days   Corky Sox, MD 11/13/2012, 10:10 AM Pager: [phone]

## 2012-11-13 NOTE — Progress Notes (Signed)
[facility] Progress Note  Subjective:   No c/o's. Afebrile.  Objective Filed Vitals:   11/13/12 0930 11/13/12 1000 11/13/12 1030 11/13/12 1100  BP: 90/47 82/41 77/42  98/35  Pulse: 65 60 56 53  Temp:      TempSrc:      Resp: 15 16 13 15   Height:      Weight:      SpO2:       Physical Exam General: elderly, alert, cooperative, NAD Heart: RRR. 2/6 murmur LUSB. No rub or gallop Lungs: CTA bilat, decr at bases/poor effort. No wheezes, rales, rhonchi Abdomen: Soft, NT, non-distended, nl BS Extremities: + LE edema, legs elevated Dialysis Access: L thigh AVG patent and w/out signs of infection  Assessment/Plan: 1. Neutropenic Fever/ SIRS - Per primary. Improving but still no clear source of infection. Empiric Vanc and Aztreonam. Blood cxs neg thus far. Hx of endocarditis. ID consulted. Plan repeat TEE and VVS consult if BCs are positive. Possible CT chest/abdomen in the am. 2. Transaminitis - AST and ALT elevated on admit with slight downward trend on repeat labs. Imaging in the am no further improvement. HIV meds may be contributory. Per primary.  3. ESRD - TTS, K+ 4.6, HD today 4. HyPOtension/volume - SBPs still soft but improved somewhat with leg cuff. On midodrine BID. Up about 4kg by weights s/p gentle IVF. Gaining wgt outpatient with several recent increases to EDW per op notes. Post wgt 88.4 on 11/6. Mild pulm vasc congestion on CXR, + LE edema. UF goal 3-4L today. 5.  Anemia - Hgb 11.5. No op ESAs. Last Tsat 49% with Ferr 693. Weekly venofer 50mg  held for now - last dose was 11/6. Follow CBC  6. Metabolic bone disease - Ca 9.5 (10.1 corrected). Last P 5.6 on Phoslo 2 ac. PTH within range. Cont binders and Vit d. Low Ca bath for now.  7. Nutrition - Alb 3.2. Renal diet, multivitamin  8. HIV - Continue home meds. Last CD4 320 in September. Recheck op per ID  Collene Leyden. Rhodia Albright [facility] Pager [phone] 11/13/2012,12:15 PM  LOS: 2 days   I have  seen and examined patient, discussed with PA and agree with assessment and plan as outlined above.  Workup in progress for fevers Kelly Splinter MD pager [phone]    cell [phone] 11/13/2012, 1:05 PM     Additional Objective Labs: Basic Metabolic Panel:  Recent Labs Lab 11/11/12 1855 11/12/12 0125 11/13/12 0748  NA 133* 130* 130*  K 4.3 4.8 4.6  CL 92* 92* 91*  CO2 28 26 24   GLUCOSE 92 94 120*  BUN 23 27* 52*  CREATININE 5.15* 5.40* 8.46*  CALCIUM 10.3 9.8 9.5  PHOS  --   --  5.3*   Liver Function Tests:  Recent Labs Lab 11/11/12 1855 11/12/12 0125 11/13/12 0748  AST 293* 240* 152*  ALT 215* 180* 127*  ALKPHOS 259* 229* 219*  BILITOT 1.3* 1.1 0.7  PROT 9.4* 8.7* 8.6*  ALBUMIN 3.8 3.3* 3.2*   No results found for this basename: LIPASE, AMYLASE,  in the last 168 hours CBC:  Recent Labs Lab 11/11/12 1855 11/12/12 0125 11/13/12 0748  WBC 2.9* 2.9* 2.8*  NEUTROABS 0.9* 0.9* 1.1*  HGB 12.0 11.4* 11.5*  HCT 36.0 33.9* 33.4*  MCV 109.4* 110.8* 109.2*  PLT 139* 134* 143*   Blood Culture    Component Value Date/Time   SDES BLOOD ARM LEFT 11/11/2012 1853   SPECREQUEST BOTTLES DRAWN AEROBIC AND  ANAEROBIC 10CC 11/11/2012 1853   CULT  Value:        BLOOD CULTURE RECEIVED NO GROWTH TO DATE CULTURE WILL BE HELD FOR 5 DAYS BEFORE ISSUING A FINAL NEGATIVE REPORT Performed at [facility] 11/11/2012 1853   REPTSTATUS PENDING 11/11/2012 1853    Studies/Results: Dg Chest Port 1 View  11/11/2012   CLINICAL DATA:  Fever. Short of breath. Dialysis patient.  EXAM: PORTABLE CHEST - 1 VIEW  COMPARISON:  03/29/2012.  FINDINGS: Apical lordotic projection. Cardiopericardial silhouette within normal limits. Interval removal of right IJ dialysis catheter. No airspace disease or effusion. Surgical clips in the axilla bilaterally.  IMPRESSION: No active cardiopulmonary disease.   Electronically Signed   By: Dereck Ligas M.D.   On: 11/11/2012 18:01   Medications:   .  abacavir  600 mg Oral Q1200  . aspirin  81 mg Oral Q1200  . aztreonam  1 g Intravenous Q24H  . calcium acetate  1,334 mg Oral TID WC  . Darunavir Ethanolate  800 mg Oral Q breakfast  . doxercalciferol  6 mcg Intravenous Q T,Th,Sa-HD  . heparin  5,000 Units Subcutaneous Q8H  . lamiVUDine  50 mg Oral Q1200  . midodrine      . midodrine  10 mg Oral BID  . ritonavir  100 mg Oral Q breakfast  . sodium chloride  3 mL Intravenous Q12H  . vancomycin  1,000 mg Intravenous Q T,Th,Sa-HD

## 2012-11-13 NOTE — Procedures (Signed)
I was present at this dialysis session, have reviewed the session itself and made  appropriate changes   Kelly Splinter MD  pager [phone]    cell [phone]  11/13/2012, 11:13 AM

## 2012-11-14 DIAGNOSIS — E46 Unspecified protein-calorie malnutrition: Secondary | ICD-10-CM

## 2012-11-14 DIAGNOSIS — D72829 Elevated white blood cell count, unspecified: Secondary | ICD-10-CM

## 2012-11-14 DIAGNOSIS — B20 Human immunodeficiency virus [HIV] disease: Principal | ICD-10-CM

## 2012-11-14 LAB — CBC WITH DIFFERENTIAL/PLATELET
Basophils Absolute: 0 10*3/uL (ref 0.0–0.1)
Basophils Relative: 1 % (ref 0–1)
Eosinophils Absolute: 0.1 10*3/uL (ref 0.0–0.7)
Eosinophils Relative: 1 % (ref 0–5)
HCT: 34.6 % — ABNORMAL LOW (ref 36.0–46.0)
Hemoglobin: 11.8 g/dL — ABNORMAL LOW (ref 12.0–15.0)
Lymphocytes Relative: 62 % — ABNORMAL HIGH (ref 12–46)
Lymphs Abs: 2.2 10*3/uL (ref 0.7–4.0)
MCH: 37.3 pg — ABNORMAL HIGH (ref 26.0–34.0)
MCHC: 34.1 g/dL (ref 30.0–36.0)
MCV: 109.5 fL — ABNORMAL HIGH (ref 78.0–100.0)
Monocytes Absolute: 0.7 10*3/uL (ref 0.1–1.0)
Monocytes Relative: 19 % — ABNORMAL HIGH (ref 3–12)
Neutro Abs: 0.6 10*3/uL — ABNORMAL LOW (ref 1.7–7.7)
Neutrophils Relative %: 18 % — ABNORMAL LOW (ref 43–77)
Platelets: 147 10*3/uL — ABNORMAL LOW (ref 150–400)
RBC: 3.16 MIL/uL — ABNORMAL LOW (ref 3.87–5.11)
RDW: 12.4 % (ref 11.5–15.5)
WBC: 3.6 10*3/uL — ABNORMAL LOW (ref 4.0–10.5)

## 2012-11-14 LAB — COMPREHENSIVE METABOLIC PANEL
ALT: 111 U/L — ABNORMAL HIGH (ref 0–35)
AST: 129 U/L — ABNORMAL HIGH (ref 0–37)
Albumin: 3.3 g/dL — ABNORMAL LOW (ref 3.5–5.2)
Alkaline Phosphatase: 216 U/L — ABNORMAL HIGH (ref 39–117)
BUN: 24 mg/dL — ABNORMAL HIGH (ref 6–23)
CO2: 25 mEq/L (ref 19–32)
Calcium: 9.9 mg/dL (ref 8.4–10.5)
Chloride: 92 mEq/L — ABNORMAL LOW (ref 96–112)
Creatinine, Ser: 5.4 mg/dL — ABNORMAL HIGH (ref 0.50–1.10)
GFR calc Af Amer: 9 mL/min — ABNORMAL LOW (ref >90)
GFR calc non Af Amer: 8 mL/min — ABNORMAL LOW (ref >90)
Glucose, Bld: 83 mg/dL (ref 70–99)
Potassium: 4.6 mEq/L (ref 3.5–5.1)
Sodium: 133 mEq/L — ABNORMAL LOW (ref 135–145)
Total Bilirubin: 0.6 mg/dL (ref 0.3–1.2)
Total Protein: 8.9 g/dL — ABNORMAL HIGH (ref 6.0–8.3)

## 2012-11-14 NOTE — Progress Notes (Signed)
[facility] Progress Note  Subjective:  No c/o's. Remains afebrile.  Objective Filed Vitals:   11/13/12 1625 11/13/12 1900 11/13/12 2012 11/14/12 0609  BP: 67/39 64/39  74/36  Pulse: 75 79    Temp: 98.6 F (37 C) 98.7 F (37.1 C)  98.4 F (36.9 C)  TempSrc:  Oral  Oral  Resp: 18 18  18   Height:      Weight:   89.6 kg (197 lb 8.5 oz)   SpO2: 97% 95%  96%   Physical Exam General: Elderly, cooperative, NAD  Heart: RRR, 2/6 murmur LUSB, no rub Lungs: Grossly clear, poor effort. No appreciable wheezes, rhonchi Abdomen: Soft, NT, non-distended, nl BS Extremities: Trace LE edema Dialysis Access: L thigh AVG + bruit  Dialysis Orders: [facility] .  EDW 87.5 kg HD Bath 2K/2.25 Ca Time 4:00 Heparin 5900. Access L thigh AVG BFR 400 DFR A1.5  Hectorol 6 mcg IV/HD Epogen 0 Units IV/HD Venofer 50 mg IV q week  Recent labs: Hgb 11.9, Tsat 49%, Ferr 693, P 5.6, PTH 205   Assessment/Plan: 1. Neutropenic Fever/ SIRS - Resolving. No clear source of infection. Continue empiric Vanc and Aztreonam per ID and follow BC's which are still neg thus far. Hx of endocarditis.  Plan repeat TEE and VVS consult if BCs are positive. Possible CT chest/abdomen if fever recurs per primary.  No blood cx's drawn at outpatient HD, blood cx's here neg so far, doing better, fevers down 2. Transaminitis - AST and ALT elevated on admit, trending down on repeat labs. Likely due to hypotension per ID.  3. ESRD - TTS, K+ 4.6, Next HD 11/11 4. HyPOtension/volume - SBPs still soft but improved somewhat with leg cuff. On midodrine BID. Up about 2 g by weights, hypotension limiting UF. Gaining wgt outpatient with several recent increases to EDW per op notes. UF to dry wgt as tolerated. May need increase at discharge. 5. Anemia - Hgb 11.8. No op ESAs. Last Tsat 49% with Ferr 693. Weekly venofer 50mg  held for now - last dose was 11/6. Follow CBC  6. Metabolic bone disease - Ca 9.9 (10.5 corrected). Last  P 5.3 on Phoslo 2 ac. PTH within range. Cont binders and Vit d. Low Ca bath here and continue at d/c  7. Nutrition - Alb 3.3. Renal diet, multivitamin  8. HIV - Continue home meds. Last CD4 320 in September. Recheck op per ID  Collene Leyden. Cletus Gash, PA-C [facility] Pager [phone] 11/14/2012,8:56 AM  LOS: 3 days   I have seen and examined patient, discussed with PA and agree with assessment and plan as outlined above with additions as indicated. Kelly Splinter MD pager [phone]    cell [phone] 11/14/2012, 2:38 PM     Additional Objective Labs: Basic Metabolic Panel:  Recent Labs Lab 11/12/12 0125 11/13/12 0748 11/14/12 0620  NA 130* 130* 133*  K 4.8 4.6 4.6  CL 92* 91* 92*  CO2 26 24 25   GLUCOSE 94 120* 83  BUN 27* 52* 24*  CREATININE 5.40* 8.46* 5.40*  CALCIUM 9.8 9.5 9.9  PHOS  --  5.3*  --    Liver Function Tests:  Recent Labs Lab 11/12/12 0125 11/13/12 0748 11/14/12 0620  AST 240* 152* 129*  ALT 180* 127* 111*  ALKPHOS 229* 219* 216*  BILITOT 1.1 0.7 0.6  PROT 8.7* 8.6* 8.9*  ALBUMIN 3.3* 3.2* 3.3*   CBC:  Recent Labs Lab 11/11/12 1855 11/12/12 0125 11/13/12 0748 11/14/12  0620  WBC 2.9* 2.9* 2.8* 3.6*  NEUTROABS 0.9* 0.9* 1.1* 0.6*  HGB 12.0 11.4* 11.5* 11.8*  HCT 36.0 33.9* 33.4* 34.6*  MCV 109.4* 110.8* 109.2* 109.5*  PLT 139* 134* 143* 147*   Blood Culture    Component Value Date/Time   SDES BLOOD ARM LEFT 11/11/2012 1853   SPECREQUEST BOTTLES DRAWN AEROBIC AND ANAEROBIC 10CC 11/11/2012 1853   CULT  Value:        BLOOD CULTURE RECEIVED NO GROWTH TO DATE CULTURE WILL BE HELD FOR 5 DAYS BEFORE ISSUING A FINAL NEGATIVE REPORT Performed at [facility] 11/11/2012 1853   REPTSTATUS PENDING 11/11/2012 1853    Studies/Results: No results found. Medications:   . abacavir  600 mg Oral Q1200  . aspirin  81 mg Oral Q1200  . aztreonam  1 g Intravenous Q24H  . calcium acetate  1,334 mg Oral TID WC  . Darunavir Ethanolate  800  mg Oral Q breakfast  . doxercalciferol  6 mcg Intravenous Q T,Th,Sa-HD  . heparin  5,000 Units Subcutaneous Q8H  . lamiVUDine  50 mg Oral Q1200  . midodrine  10 mg Oral BID  . ritonavir  100 mg Oral Q breakfast  . sodium chloride  3 mL Intravenous Q12H  . vancomycin  1,000 mg Intravenous Q T,Th,Sa-HD

## 2012-11-14 NOTE — Progress Notes (Signed)
Patient ID: [name], female   DOB: Jun 11, 1955, 57 y.o.   MRN: [id number]         [facility]    Date of Admission:  11/11/2012           Day 4 vancomycin        Day 4 aztreonam  Principal Problem:   Neutropenic fever Active Problems:   HIV INFECTION   ANEMIA OF CHRONIC DISEASE   history of pancytopenia - BM bx suggestive of HIV etiology   ESRD (end stage renal disease)   Hypotension, intermittent esp after HD   Transaminitis   Fever, unspecified   . abacavir  600 mg Oral Q1200  . aspirin  81 mg Oral Q1200  . aztreonam  1 g Intravenous Q24H  . calcium acetate  1,334 mg Oral TID WC  . Darunavir Ethanolate  800 mg Oral Q breakfast  . doxercalciferol  6 mcg Intravenous Q T,Th,Sa-HD  . heparin  5,000 Units Subcutaneous Q8H  . lamiVUDine  50 mg Oral Q1200  . midodrine  10 mg Oral BID  . ritonavir  100 mg Oral Q breakfast  . sodium chloride  3 mL Intravenous Q12H  . vancomycin  1,000 mg Intravenous Q T,Th,Sa-HD    Subjective: She is feeling well and denies any current complaints.  Objective: Temp:  [97.6 F (36.4 C)-98.7 F (37.1 C)] 98 F (36.7 C) (11/09 0911) Pulse Rate:  [55-85] 85 (11/09 0911) Resp:  [14-19] 18 (11/09 0911) BP: (64-156)/(22-106) 147/65 mmHg (11/09 0945) SpO2:  [95 %-98 %] 95 % (11/09 0911) Weight:  [89.6 kg (197 lb 8.5 oz)] 89.6 kg (197 lb 8.5 oz) (11/08 2012)  General: Alert and comfortable Skin: No rash Lungs: Clear Cor: Regular S1 and S2 with a 2/6 systolic murmur Abdomen: Nontender Left thigh graft appears normal  Lab Results Lab Results  Component Value Date   WBC 3.6* 11/14/2012   HGB 11.8* 11/14/2012   HCT 34.6* 11/14/2012   MCV 109.5* 11/14/2012   PLT 147* 11/14/2012    Lab Results  Component Value Date   CREATININE 5.40* 11/14/2012   BUN 24* 11/14/2012   NA 133* 11/14/2012   K 4.6 11/14/2012   CL 92* 11/14/2012   CO2 25 11/14/2012    Lab Results  Component Value Date   ALT 111* 11/14/2012   AST  129* 11/14/2012   ALKPHOS 216* 11/14/2012   BILITOT 0.6 11/14/2012      Microbiology: Recent Results (from the past 240 hour(s))  CULTURE, BLOOD (ROUTINE X 2)     Status: None   Collection Time    11/11/12  6:40 PM      Result Value Range Status   Specimen Description BLOOD FOREARM LEFT   Final   Special Requests BOTTLES DRAWN AEROBIC ONLY 8CC   Final   Culture  Setup Time     Final   Value: 11/12/2012 03:05     Performed at [facility]   Culture     Final   Value:        BLOOD CULTURE RECEIVED NO GROWTH TO DATE CULTURE WILL BE HELD FOR 5 DAYS BEFORE ISSUING A FINAL NEGATIVE REPORT     Performed at [facility]   Report Status PENDING   Incomplete  CULTURE, BLOOD (ROUTINE X 2)     Status: None   Collection Time    11/11/12  6:53 PM      Result Value Range Status  Specimen Description BLOOD ARM LEFT   Final   Special Requests BOTTLES DRAWN AEROBIC AND ANAEROBIC 10CC   Final   Culture  Setup Time     Final   Value: 11/12/2012 03:04     Performed at [facility]   Culture     Final   Value:        BLOOD CULTURE RECEIVED NO GROWTH TO DATE CULTURE WILL BE HELD FOR 5 DAYS BEFORE ISSUING A FINAL NEGATIVE REPORT     Performed at [facility]   Report Status PENDING   Incomplete  MRSA PCR SCREENING     Status: None   Collection Time    11/11/12 11:15 PM      Result Value Range Status   MRSA by PCR NEGATIVE  NEGATIVE Final   Comment:            The GeneXpert MRSA Assay (FDA     approved for NASAL specimens     only), is one component of a     comprehensive MRSA colonization     surveillance program. It is not     intended to diagnose MRSA     infection nor to guide or     monitor treatment for     MRSA infections.   Assessment: She is clinically improved on empiric therapy. Her absolute neutrophil count remains low but her total white blood cells are increasing in her liver enzymes are coming down. I would continue her current antibiotic therapy  for now. Assuming blood cultures remain negative I would treat for 7-8 days then consider stopping antibiotics.  Plan: 1. Continue vancomycin and aztreonam for now  Michel Bickers, MD [facility] [phone] pager   [phone] cell 11/14/2012, 11:32 AM

## 2012-11-14 NOTE — Progress Notes (Signed)
Subjective: [name] is a 57 year old female with PMH of ESRD on HD TTS, Pancytopenia (baseline Hgb ~10), HIV (CD4 320, undect viral load on 09/13/12), and multiple hospitalizations for fever and bacteremia, found to be febrile to 104 at HD on 11/11/12.   Patient seen at bedside this AM. Sitting up eating breakfast, reading the newspaper, very content w/ no complaints. Denies any chest pain, SOB, fever, chills, nausea, vomiting, or abdominal pain.   Blood cultures still with no growth.  Objective: Vital signs in last 24 hours: Filed Vitals:   11/13/12 2012 11/14/12 0609 11/14/12 0911 11/14/12 0945  BP:  74/36  147/65  Pulse:   85   Temp:  98.4 F (36.9 C) 98 F (36.7 C)   TempSrc:  Oral    Resp:  18 18   Height:      Weight: 197 lb 8.5 oz (89.6 kg)     SpO2:  96% 95%    Weight change: 4 lb 6.6 oz (2 kg)  Intake/Output Summary (Last 24 hours) at 11/14/12 [id number] Last data filed at 11/13/12 1813  Gross per 24 hour  Intake    240 ml  Output   2636 ml  Net  -2396 ml   Physical Exam: General: Alert, cooperative, and in no apparent distress HEENT: Vision grossly intact, oropharynx clear and non-erythematous  Neck: Full range of motion without pain, supple, no lymphadenopathy or carotid bruits Lungs: Clear to ascultation bilaterally, normal work of respiration, no wheezes, rales, ronchi Heart: Regular rate and rhythm, 2/6 systolic murmur, no gallops, or rubs Abdomen: Soft, non-tender, non-distended, normal bowel sounds Extremities: No cyanosis, clubbing, or edema. Multiple scars in UE's from previous grafts in the past. AVG in left thigh w/ thrill and bruit. Neurologic: Alert & oriented X3, cranial nerves II-XII intact, strength grossly intact, sensation intact to light touch  Lab Results: Basic Metabolic Panel:  Recent Labs Lab 11/13/12 0748 11/14/12 0620  NA 130* 133*  K 4.6 4.6  CL 91* 92*  CO2 24 25  GLUCOSE 120* 83  BUN 52* 24*  CREATININE 8.46* 5.40*  CALCIUM  9.5 9.9  PHOS 5.3*  --    Liver Function Tests:  Recent Labs Lab 11/13/12 0748 11/14/12 0620  AST 152* 129*  ALT 127* 111*  ALKPHOS 219* 216*  BILITOT 0.7 0.6  PROT 8.6* 8.9*  ALBUMIN 3.2* 3.3*   CBC:  Recent Labs Lab 11/13/12 0748 11/14/12 0620  WBC 2.8* 3.6*  NEUTROABS 1.1* 0.6*  HGB 11.5* 11.8*  HCT 33.4* 34.6*  MCV 109.2* 109.5*  PLT 143* 147*   Coagulation:  Recent Labs Lab 11/12/12 0125  LABPROT 14.8  INR 1.19    Micro Results: Recent Results (from the past 240 hour(s))  CULTURE, BLOOD (ROUTINE X 2)     Status: None   Collection Time    11/11/12  6:40 PM      Result Value Range Status   Specimen Description BLOOD FOREARM LEFT   Final   Special Requests BOTTLES DRAWN AEROBIC ONLY 8CC   Final   Culture  Setup Time     Final   Value: 11/12/2012 03:05     Performed at [facility]   Culture     Final   Value:        BLOOD CULTURE RECEIVED NO GROWTH TO DATE CULTURE WILL BE HELD FOR 5 DAYS BEFORE ISSUING A FINAL NEGATIVE REPORT     Performed at [facility]  Report Status PENDING   Incomplete  CULTURE, BLOOD (ROUTINE X 2)     Status: None   Collection Time    11/11/12  6:53 PM      Result Value Range Status   Specimen Description BLOOD ARM LEFT   Final   Special Requests BOTTLES DRAWN AEROBIC AND ANAEROBIC 10CC   Final   Culture  Setup Time     Final   Value: 11/12/2012 03:04     Performed at [facility]   Culture     Final   Value:        BLOOD CULTURE RECEIVED NO GROWTH TO DATE CULTURE WILL BE HELD FOR 5 DAYS BEFORE ISSUING A FINAL NEGATIVE REPORT     Performed at [facility]   Report Status PENDING   Incomplete  MRSA PCR SCREENING     Status: None   Collection Time    11/11/12 11:15 PM      Result Value Range Status   MRSA by PCR NEGATIVE  NEGATIVE Final   Comment:            The GeneXpert MRSA Assay (FDA     approved for NASAL specimens     only), is one component of a     comprehensive MRSA colonization      surveillance program. It is not     intended to diagnose MRSA     infection nor to guide or     monitor treatment for     MRSA infections.   Studies/Results: No results found. Medications: I have reviewed the patient's current medications. Scheduled Meds: . abacavir  600 mg Oral Q1200  . aspirin  81 mg Oral Q1200  . aztreonam  1 g Intravenous Q24H  . calcium acetate  1,334 mg Oral TID WC  . Darunavir Ethanolate  800 mg Oral Q breakfast  . doxercalciferol  6 mcg Intravenous Q T,Th,Sa-HD  . heparin  5,000 Units Subcutaneous Q8H  . lamiVUDine  50 mg Oral Q1200  . midodrine  10 mg Oral BID  . ritonavir  100 mg Oral Q breakfast  . sodium chloride  3 mL Intravenous Q12H  . vancomycin  1,000 mg Intravenous Q T,Th,Sa-HD   Continuous Infusions:  PRN Meds:.oxyCODONE  Assessment/Plan: [name] is a 57 year old female with PMH of ESRD on HD TTS, Pancytopenia (baseline Hgb ~10), HIV (CD4 320, undect viral load on 09/13/12), and multiple hospitalizations for fever and bacteremia, found to be febrile to 104 at HD on 11/11/12.   Patient with extensive ID history in the past as follows: 02/11/12 --> MRSA Bacteremia with HCAP, blood cx 2/6, leukocytosis (10.9), Tm 104 (TEE - no vegetations, vascular revised graft - not infected) --> treated with Vanc, Cefepime, Zyvox, Ancef --> Vanc x 6 weeks 12/19/11 --> HCAP with possible bacteremia, blood cx 1/4 gram + cocci in pairs & chains (ID thought contaminant), normal wbc, Tm 105.8 --> treated with levaquin, aztreonam, daptomycin (given VRE hx), Vanc  08/21/11 --> VRE bacteremia & C.diff, 4/4 blood cx & right chest HD catheter tip + enterococcus 80K colonies, WBC 2.8, Abs Neut 1.6, Tm 103, TEE with small calcified mobile mass on chordae tendinae --> treated with Rocephin, vanc, zyvoxx, Daptomycin --> Dapto x 2 weeks  07/11/11 --> C diff with Tm 103.6, WBC 1.6  05/18/11 --> Bacteremia (likely d/t HD catheter) with bacterial endocarditis & HCAP -->  Vanc/Zosyn then Ancef x 6 weeks  12/23/10 --> MSSA bacteremia  with RLL PNA --> Ancef x 6 weeks  Sepsis w/ unknown origin- Patient came from HD center with fever of 104. NO cultures drawn at dialysis center. Met SIRS criteria w/ fever and WBC's of 2.9. At this time there is no obvious source of infection. CXR portable 1 view on admission showed no sign of respiratory source and patient denies cough, patient does not make urine, no signs of obvious infection at fistula site, no HA, AMS, or stiff neck, no diarrhea. Lactic acid was wnl on admission. Blood cultures drawn, still with no growth, started on IV Vancomycin, and Aztreonam.  -ID consulted, appreciate recommendations. 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 Suggested CT chest abdomen pelvis, will consider if fever returns.  Hypotension- Presented to ED w/ BP of 65/25. Improved after patient was bolused w/ 500 cc. Has had multiple UE vascular surgeries. BP also low throughout the day. With BP cuff on right thigh, BP 147/65 this AM -Continue Midodrine -Continue to monitor pressures on thigh if necessary, will bolus if hypotensive.   Transaminitis- AST 25 in May 2014. AST and ALT elevated to ~5 times upper limit on admission. Patient has no history of Hep B or C. She does have a history of gallstones but does not currently have any abdominal pain. -Repeat LFT's this AM continue to trend down. Will continue to follow.   History of Endocarditis- MSSA -Consider TEE if BCx +.  - Patient does have a systolic murmur that was present on previous admissions (at least as early as Dec 2012), no noted MR, AS, or PS on TEE in February 2014.  HIV -CD4 320 in September with undect viral load  -Continue home HIV medications   Pancytopenia (chronic) -Chronic, patient did have a bone marrow Bx in 2012 that was hypercellular thought likely due to uncontrolled HIV. However last CBC in  September had WBC of 4.1, mild anemia, and platelets of 170.   ESRD (end stage renal disease) on HD TTS- Nephrology following. HD yesterday.  Dispo: Disposition is deferred at this time, awaiting improvement of current medical problems.  Anticipated discharge in approximately 1-2 day(s).   The patient does have a current PCP Ivin Poot, MD) and does need an [facility] hospital follow-up appointment after discharge.  The patient does not have transportation limitations that hinder transportation to clinic appointments.  .Services Needed at time of discharge: Y = Yes, Blank = No PT:   OT:   RN:   Equipment:   Other:     LOS: 3 days   Corky Sox, MD 11/14/2012, 9:52 AM Pager: [phone]

## 2012-11-14 NOTE — Progress Notes (Signed)
PHARMACY NOTE  Pharmacy Consult for :  Vancomycin, Azactam Indication:  Empiric antibiotics in immunocompromised patient with fever  Hospital Problems Principal Problem:   Neutropenic fever Active Problems:   HIV INFECTION   ANEMIA OF CHRONIC DISEASE   history of pancytopenia - BM bx suggestive of HIV etiology   ESRD (end stage renal disease)   Hypotension, intermittent esp after HD   Transaminitis   Fever, unspecified   Weight: 90 kg  Vitals: BP 148/86  Pulse 73  Temp(Src) 98 F (36.7 C) (Oral)  Resp 18  Ht 5\' 8"  (1.727 m)  Wt 197 lb 8.5 oz (89.6 kg)  BMI 30.04 kg/m2  SpO2 100%  Labs:  Recent Labs  11/12/12 0125 11/13/12 0748 11/14/12 0620  WBC 2.9* 2.8* 3.6*  HGB 11.4* 11.5* 11.8*  PLT 134* 143* 147*  CREATININE 5.40* 8.46* 5.40*   Estimated Creatinine Clearance: 13.6 ml/min (by C-G formula based on Cr of 5.4).   Microbiology: Recent Results (from the past 720 hour(s))  CULTURE, BLOOD (ROUTINE X 2)     Status: None   Collection Time    11/11/12  6:40 PM      Result Value Range Status   Specimen Description BLOOD FOREARM LEFT   Final   Special Requests BOTTLES DRAWN AEROBIC ONLY 8CC   Final   Culture  Setup Time     Final   Value: 11/12/2012 03:05     Performed at [facility]   Culture     Final   Value:        BLOOD CULTURE RECEIVED NO GROWTH TO DATE CULTURE WILL BE HELD FOR 5 DAYS BEFORE ISSUING A FINAL NEGATIVE REPORT     Performed at [facility]   Report Status PENDING   Incomplete  CULTURE, BLOOD (ROUTINE X 2)     Status: None   Collection Time    11/11/12  6:53 PM      Result Value Range Status   Specimen Description BLOOD ARM LEFT   Final   Special Requests BOTTLES DRAWN AEROBIC AND ANAEROBIC 10CC   Final   Culture  Setup Time     Final   Value: 11/12/2012 03:04     Performed at [facility]   Culture     Final   Value:        BLOOD CULTURE RECEIVED NO GROWTH TO DATE CULTURE WILL BE HELD FOR 5 DAYS  BEFORE ISSUING A FINAL NEGATIVE REPORT     Performed at [facility]   Report Status PENDING   Incomplete  MRSA PCR SCREENING     Status: None   Collection Time    11/11/12 11:15 PM      Result Value Range Status   MRSA by PCR NEGATIVE  NEGATIVE Final   Comment:            The GeneXpert MRSA Assay (FDA     approved for NASAL specimens     only), is one component of a     comprehensive MRSA colonization     surveillance program. It is not     intended to diagnose MRSA     infection nor to guide or     monitor treatment for     MRSA infections.    Anti-infectives Anti-infectives   Start     Dose/Rate Route Frequency Ordered Stop   11/13/12 2000  levofloxacin (LEVAQUIN) IVPB 500 mg  Status:  Discontinued     500  mg 100 mL/hr over 60 Minutes Intravenous Every 48 hours 11/11/12 2011 11/11/12 2320   11/13/12 1200  vancomycin (VANCOCIN) IVPB 1000 mg/200 mL premix     1,000 mg 200 mL/hr over 60 Minutes Intravenous Every T-Th-Sa (Hemodialysis) 11/12/12 1434     11/12/12 2100  aztreonam (AZACTAM) 1 g in dextrose 5 % 50 mL IVPB     1 g 100 mL/hr over 30 Minutes Intravenous Every 24 hours 11/11/12 2011     11/12/12 1445  vancomycin (VANCOCIN) IVPB 1000 mg/200 mL premix     1,000 mg 200 mL/hr over 60 Minutes Intravenous  Once 11/12/12 1434 11/12/12 1606   11/12/12 1200  lamiVUDine (EPIVIR) 10 MG/ML solution 50 mg     50 mg Oral Daily 11/11/12 2322     11/12/12 1200  abacavir (ZIAGEN) tablet 600 mg     600 mg Oral Daily 11/11/12 2322     11/12/12 0700  Darunavir Ethanolate (PREZISTA) tablet 800 mg     800 mg Oral Daily with breakfast 11/11/12 2322     11/12/12 0700  ritonavir (NORVIR) tablet 100 mg     100 mg Oral Daily with breakfast 11/11/12 2322     11/11/12 2030  vancomycin (VANCOCIN) IVPB 750 mg/150 ml premix     750 mg 150 mL/hr over 60 Minutes Intravenous  Once 11/11/12 2029 11/12/12 0053   11/11/12 1945  levofloxacin (LEVAQUIN) IVPB 750 mg     750 mg 100 mL/hr over  90 Minutes Intravenous  Once 11/11/12 1940 11/11/12 2200   11/11/12 1945  aztreonam (AZACTAM) 2 g in dextrose 5 % 50 mL IVPB     2 g 100 mL/hr over 30 Minutes Intravenous  Once 11/11/12 1940 11/11/12 2250   11/11/12 1945  vancomycin (VANCOCIN) IVPB 1000 mg/200 mL premix  Status:  Discontinued     1,000 mg 200 mL/hr over 60 Minutes Intravenous  Once 11/11/12 1940 11/12/12 1433      Assessment:  57 yo female with HIV, ESRD on HD,  prior MSSA endocarditis, recurrent MSSA, MRSA bacteremias, recurrent CDI on Day # 4 of empiric Aztreonam and Vancomycin for fever and septic picture.  WBC's slowly trending up.  Afebrile.  Cultures pending.  Goal of Therapy:   Pre-HD Vancomycin trough 15 - 25 mcg/ml Antibiotics selected for infection/cultures and adjusted for renal function.   Plan:   Continue Aztreonam and Vancomycin as previously ordered.  Continue HIV meds.  No adjustments required at this time. Follow up cultures, clinical course and adjust as clinically indicated.  Vancomycin levels as indicated.  Estelle June, Pharm.D.  11/14/2012 1:37 PM

## 2012-11-15 ENCOUNTER — Encounter (HOSPITAL_COMMUNITY): Payer: Self-pay | Admitting: Radiology

## 2012-11-15 ENCOUNTER — Inpatient Hospital Stay (HOSPITAL_COMMUNITY): Payer: Medicaid Other

## 2012-11-15 LAB — COMPREHENSIVE METABOLIC PANEL
ALT: 86 U/L — ABNORMAL HIGH (ref 0–35)
AST: 88 U/L — ABNORMAL HIGH (ref 0–37)
Albumin: 3.3 g/dL — ABNORMAL LOW (ref 3.5–5.2)
Alkaline Phosphatase: 209 U/L — ABNORMAL HIGH (ref 39–117)
BUN: 41 mg/dL — ABNORMAL HIGH (ref 6–23)
CO2: 26 mEq/L (ref 19–32)
Calcium: 10.3 mg/dL (ref 8.4–10.5)
Chloride: 93 mEq/L — ABNORMAL LOW (ref 96–112)
Creatinine, Ser: 7.48 mg/dL — ABNORMAL HIGH (ref 0.50–1.10)
GFR calc Af Amer: 6 mL/min — ABNORMAL LOW (ref >90)
GFR calc non Af Amer: 5 mL/min — ABNORMAL LOW (ref >90)
Glucose, Bld: 81 mg/dL (ref 70–99)
Potassium: 4.2 mEq/L (ref 3.5–5.1)
Sodium: 132 mEq/L — ABNORMAL LOW (ref 135–145)
Total Bilirubin: 0.5 mg/dL (ref 0.3–1.2)
Total Protein: 8.9 g/dL — ABNORMAL HIGH (ref 6.0–8.3)

## 2012-11-15 LAB — CBC
HCT: 35.4 % — ABNORMAL LOW (ref 36.0–46.0)
Hemoglobin: 12.1 g/dL (ref 12.0–15.0)
MCH: 37.1 pg — ABNORMAL HIGH (ref 26.0–34.0)
MCHC: 34.2 g/dL (ref 30.0–36.0)
MCV: 108.6 fL — ABNORMAL HIGH (ref 78.0–100.0)
Platelets: 153 10*3/uL (ref 150–400)
RBC: 3.26 MIL/uL — ABNORMAL LOW (ref 3.87–5.11)
RDW: 12.3 % (ref 11.5–15.5)
WBC: 3.2 10*3/uL — ABNORMAL LOW (ref 4.0–10.5)

## 2012-11-15 MED ORDER — RENA-VITE PO TABS
1.0000 | ORAL_TABLET | Freq: Every day | ORAL | Status: DC
  Administered 2012-11-15 – 2012-11-17 (×3): 1 via ORAL
  Filled 2012-11-15 (×4): qty 1

## 2012-11-15 MED ORDER — IOHEXOL 300 MG/ML  SOLN
25.0000 mL | INTRAMUSCULAR | Status: AC
  Administered 2012-11-15 (×2): 25 mL via ORAL

## 2012-11-15 MED ORDER — DOXERCALCIFEROL 4 MCG/2ML IV SOLN
2.0000 ug | INTRAVENOUS | Status: DC
  Administered 2012-11-16 – 2012-11-18 (×2): 2 ug via INTRAVENOUS
  Filled 2012-11-15 (×2): qty 2

## 2012-11-15 MED ORDER — IOHEXOL 300 MG/ML  SOLN
100.0000 mL | Freq: Once | INTRAMUSCULAR | Status: AC | PRN
  Administered 2012-11-15: 100 mL via INTRAVENOUS

## 2012-11-15 NOTE — Progress Notes (Signed)
Subjective:  No cos reading paper in bed Objective Vital signs in last 24 hours: Filed Vitals:   11/14/12 1310 11/14/12 1613 11/14/12 2112 11/15/12 0701  BP: 148/86 149/71 124/56 93/42  Pulse: 73 60 62 68  Temp: 98 F (36.7 C) 97.4 F (36.3 C) 97.8 F (36.6 C) 97.5 F (36.4 C)  TempSrc:   Oral Oral  Resp: 18 18 18 18   Height:      Weight:   89.6 kg (197 lb 8.5 oz)   SpO2: 100% 99% 99% 100%   Weight change: -4.4 kg (-9 lb 11.2 oz)  Intake/Output Summary (Last 24 hours) at 11/15/12 1039 Last data filed at 11/14/12 1804  Gross per 24 hour  Intake    240 ml  Output      0 ml  Net    240 ml   Labs: Basic Metabolic Panel:  Recent Labs Lab 11/13/12 0748 11/14/12 0620 11/15/12 0735  NA 130* 133* 132*  K 4.6 4.6 4.2  CL 91* 92* 93*  CO2 24 25 26   GLUCOSE 120* 83 81  BUN 52* 24* 41*  CREATININE 8.46* 5.40* 7.48*  CALCIUM 9.5 9.9 10.3  PHOS 5.3*  --   --    Liver Function Tests:  Recent Labs Lab 11/13/12 0748 11/14/12 0620 11/15/12 0735  AST 152* 129* 88*  ALT 127* 111* 86*  ALKPHOS 219* 216* 209*  BILITOT 0.7 0.6 0.5  PROT 8.6* 8.9* 8.9*  ALBUMIN 3.2* 3.3* 3.3*  CBC:  Recent Labs Lab 11/11/12 1855 11/12/12 0125 11/13/12 0748 11/14/12 0620 11/15/12 0735  WBC 2.9* 2.9* 2.8* 3.6* 3.2*  NEUTROABS 0.9* 0.9* 1.1* 0.6*  --   HGB 12.0 11.4* 11.5* 11.8* 12.1  HCT 36.0 33.9* 33.4* 34.6* 35.4*  MCV 109.4* 110.8* 109.2* 109.5* 108.6*  PLT 139* 134* 143* 147* 153   Studies/Results: No results found. Medications:   . abacavir  600 mg Oral Q1200  . aspirin  81 mg Oral Q1200  . aztreonam  1 g Intravenous Q24H  . calcium acetate  1,334 mg Oral TID WC  . Darunavir Ethanolate  800 mg Oral Q breakfast  . doxercalciferol  6 mcg Intravenous Q T,Th,Sa-HD  . heparin  5,000 Units Subcutaneous Q8H  . lamiVUDine  50 mg Oral Q1200  . midodrine  10 mg Oral BID  . ritonavir  100 mg Oral Q breakfast  . sodium chloride  3 mL Intravenous Q12H  . vancomycin  1,000 mg  Intravenous Q T,Th,Sa-HD    Physical Exam: General: Alert Obese BF nad, normal ms for her Heart: RRR 1/6 sem lsb , no rub Lungs: CTA bilaterally Abdomen: BS pos. Obese, soft , nontender Extremities: Dialysis Access:  L 2 + pedal edema and R 1 + pedal edema/ pos bruit L fem AVGG   Dialysis Orders: [facility] .  EDW 87.5 kg HD Bath 2K/2.25 Ca Time 4:00 Heparin 5900. Access L thigh AVG BFR 400 DFR A1.5  Hectorol 6 mcg IV/HD Epogen 0 Units IV/HD Venofer 50 mg IV q week  Recent labs: Hgb 11.9, Tsat 49%, Ferr 693, P 5.6, PTH 205   Problem/Plan:  1.    Neutropenic Fever/ SIRS - ID following and Resolving.  ON Antibiotics and BC pending 2.   Transaminitis - Improving from admit  AST and ALT . Likely due to Acute Illness with  Hypotension.  3.    ESRD - [facility], ( [facility]) K+ 4.2  Continue on schedule in AM 4.  Hypotension/volume - 93/ 42 bp this am. On midodrine BID. Over edw  By 2 kg with op history  OP hypotension at kidney center limiting UF.Noted recent increases to EDW per op notes. UF  2 l in am  as tolerates 5.    Anemia - Hgb 12.1 No  ESAs. Weekly  Venofer 6.     Metabolic bone disease - Ca 10.3. Last P 5.3 on Phoslo 2 ac.  Fu am renal panel 7     Nutrition - Alb 3.3. Renal diet, multivitamin 8.    HIV - ID following /Continue home meds    [name] Haber, PA-C [place] [phone] 11/15/2012,10:39 AM  LOS: 4 days

## 2012-11-15 NOTE — Progress Notes (Signed)
Subjective: [name] is a 57 year old female with PMH of ESRD on HD TTS, Pancytopenia (baseline Hgb ~10), HIV (CD4 320, undect viral load on 09/13/12), and multiple hospitalizations for fever and bacteremia, found to be febrile to 104 at HD on 11/11/12.   Patient seen at bedside this AM. Appears much improved clinically, with no complaints. Ready to go home. Denies any fever, chills, cough, SOB, diarrhea, wounds, or pain over left thigh fistula site.   Objective: Vital signs in last 24 hours: Filed Vitals:   11/14/12 1310 11/14/12 1613 11/14/12 2112 11/15/12 0701  BP: 148/86 149/71 124/56 93/42  Pulse: 73 60 62 68  Temp: 98 F (36.7 C) 97.4 F (36.3 C) 97.8 F (36.6 C) 97.5 F (36.4 C)  TempSrc:   Oral Oral  Resp: 18 18 18 18   Height:      Weight:   197 lb 8.5 oz (89.6 kg)   SpO2: 100% 99% 99% 100%   Weight change: -9 lb 11.2 oz (-4.4 kg)  Intake/Output Summary (Last 24 hours) at 11/15/12 1353 Last data filed at 11/14/12 1804  Gross per 24 hour  Intake    120 ml  Output      0 ml  Net    120 ml   Physical Exam: General: Alert, cooperative, and in no apparent distress HEENT: Vision grossly intact, oropharynx clear and non-erythematous  Neck: Full range of motion without pain, supple, no lymphadenopathy or carotid bruits Lungs: Clear to ascultation bilaterally, normal work of respiration, no wheezes, rales, ronchi Heart: Regular rate and rhythm, 2/6 systolic murmur, no gallops, or rubs Abdomen: Soft, non-tender, non-distended, normal bowel sounds Extremities: No cyanosis, clubbing, or edema. Multiple scars in UE's from previous grafts in the past. AVG in left thigh w/ thrill and bruit. Neurologic: Alert & oriented X3, cranial nerves II-XII intact, strength grossly intact, sensation intact to light touch  Lab Results: Basic Metabolic Panel:  Recent Labs Lab 11/13/12 0748 11/14/12 0620 11/15/12 0735  NA 130* 133* 132*  K 4.6 4.6 4.2  CL 91* 92* 93*  CO2 24 25 26     GLUCOSE 120* 83 81  BUN 52* 24* 41*  CREATININE 8.46* 5.40* 7.48*  CALCIUM 9.5 9.9 10.3  PHOS 5.3*  --   --    Liver Function Tests:  Recent Labs Lab 11/14/12 0620 11/15/12 0735  AST 129* 88*  ALT 111* 86*  ALKPHOS 216* 209*  BILITOT 0.6 0.5  PROT 8.9* 8.9*  ALBUMIN 3.3* 3.3*   CBC:  Recent Labs Lab 11/13/12 0748 11/14/12 0620 11/15/12 0735  WBC 2.8* 3.6* 3.2*  NEUTROABS 1.1* 0.6*  --   HGB 11.5* 11.8* 12.1  HCT 33.4* 34.6* 35.4*  MCV 109.2* 109.5* 108.6*  PLT 143* 147* 153   Coagulation:  Recent Labs Lab 11/12/12 0125  LABPROT 14.8  INR 1.19    Micro Results: Recent Results (from the past 240 hour(s))  CULTURE, BLOOD (ROUTINE X 2)     Status: None   Collection Time    11/11/12  6:40 PM      Result Value Range Status   Specimen Description BLOOD FOREARM LEFT   Final   Special Requests BOTTLES DRAWN AEROBIC ONLY 8CC   Final   Culture  Setup Time     Final   Value: 11/12/2012 03:05     Performed at [facility]   Culture     Final   Value:  BLOOD CULTURE RECEIVED NO GROWTH TO DATE CULTURE WILL BE HELD FOR 5 DAYS BEFORE ISSUING A FINAL NEGATIVE REPORT     Performed at [facility]   Report Status PENDING   Incomplete  CULTURE, BLOOD (ROUTINE X 2)     Status: None   Collection Time    11/11/12  6:53 PM      Result Value Range Status   Specimen Description BLOOD ARM LEFT   Final   Special Requests BOTTLES DRAWN AEROBIC AND ANAEROBIC 10CC   Final   Culture  Setup Time     Final   Value: 11/12/2012 03:04     Performed at [facility]   Culture     Final   Value:        BLOOD CULTURE RECEIVED NO GROWTH TO DATE CULTURE WILL BE HELD FOR 5 DAYS BEFORE ISSUING A FINAL NEGATIVE REPORT     Performed at [facility]   Report Status PENDING   Incomplete  MRSA PCR SCREENING     Status: None   Collection Time    11/11/12 11:15 PM      Result Value Range Status   MRSA by PCR NEGATIVE  NEGATIVE Final   Comment:             The GeneXpert MRSA Assay (FDA     approved for NASAL specimens     only), is one component of a     comprehensive MRSA colonization     surveillance program. It is not     intended to diagnose MRSA     infection nor to guide or     monitor treatment for     MRSA infections.   Studies/Results: No results found. Medications: I have reviewed the patient's current medications. Scheduled Meds: . abacavir  600 mg Oral Q1200  . aspirin  81 mg Oral Q1200  . aztreonam  1 g Intravenous Q24H  . calcium acetate  1,334 mg Oral TID WC  . Darunavir Ethanolate  800 mg Oral Q breakfast  . [START ON 11/16/2012] doxercalciferol  2 mcg Intravenous Q T,Th,Sa-HD  . heparin  5,000 Units Subcutaneous Q8H  . lamiVUDine  50 mg Oral Q1200  . midodrine  10 mg Oral BID  . multivitamin  1 tablet Oral QHS  . ritonavir  100 mg Oral Q breakfast  . sodium chloride  3 mL Intravenous Q12H  . vancomycin  1,000 mg Intravenous Q T,Th,Sa-HD   Continuous Infusions:  PRN Meds:.oxyCODONE  Assessment/Plan: JONEEN HAAB is a 57 year old female with PMH of ESRD on HD TTS, Pancytopenia (baseline Hgb ~10), HIV (CD4 320, undect viral load on 09/13/12), and multiple hospitalizations for fever and bacteremia, found to be febrile to 104 at HD on 11/11/12.   Patient with extensive ID history in the past as follows: 02/11/12 --> MRSA Bacteremia with HCAP, blood cx 2/6, leukocytosis (10.9), Tm 104 (TEE - no vegetations, vascular revised graft - not infected) --> treated with Vanc, Cefepime, Zyvox, Ancef --> Vanc x 6 weeks 12/19/11 --> HCAP with possible bacteremia, blood cx 1/4 gram + cocci in pairs & chains (ID thought contaminant), normal wbc, Tm 105.8 --> treated with levaquin, aztreonam, daptomycin (given VRE hx), Vanc  08/21/11 --> VRE bacteremia & C.diff, 4/4 blood cx & right chest HD catheter tip + enterococcus 80K colonies, WBC 2.8, Abs Neut 1.6, Tm 103, TEE with small calcified mobile mass on chordae tendinae --> treated with  Rocephin, vanc,  zyvoxx, Daptomycin --> Dapto x 2 weeks  07/11/11 --> C diff with Tm 103.6, WBC 1.6  05/18/11 --> Bacteremia (likely d/t HD catheter) with bacterial endocarditis & HCAP --> Vanc/Zosyn then Ancef x 6 weeks  12/23/10 --> MSSA bacteremia with RLL PNA --> Ancef x 6 weeks  Fever- Patient came from HD center with fever of 104. NO cultures drawn at dialysis center. Met SIRS criteria w/ fever and WBC's of 2.9. At this time there is no obvious source of infection. CXR portable 1 view on admission showed no sign of respiratory source and patient denies cough, patient does not make urine, no signs of obvious infection at fistula site, no HA, AMS, or stiff neck, no diarrhea. Lactic acid was wnl on admission. Blood cultures drawn, still with no growth (01/16/12), continue IV Vancomycin, and Aztreonam.  -ID consulted, appreciate recommendations. 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  -Will perform CT chest abdomen pelvis.  Hypotension- Presented to ED w/ BP of 65/25. Improved after patient was bolused w/ 500 cc. Has had multiple UE vascular surgeries. BP also low throughout the day. With BP cuff on right thigh, BP 93/42 today. Asymptomatic.  -Continue Midodrine -Continue to monitor pressures on thigh if necessary, will bolus if hypotensive.   Transaminitis- AST 25 in May 2014. AST and ALT elevated to ~5 times upper limit on admission. Patient has no history of Hep B or C. She does have a history of gallstones but does not currently have any abdominal pain. -Repeat LFT's this AM continue to trend down (11/15/12). Will continue to follow.   History of Endocarditis- MSSA -Consider TEE if BCx +.  - Patient does have a systolic murmur that was present on previous admissions (at least as early as Dec 2012), no noted MR, AS, or PS on TEE in February 2014.  HIV -CD4 320 in September with undect viral load  -Continue home HIV  medications   Pancytopenia (chronic) -Chronic, patient did have a bone marrow Bx in 2012 that was hypercellular thought likely due to uncontrolled HIV. However last CBC in September had WBC of 4.1, mild anemia, and platelets of 170.   ESRD (end stage renal disease) on HD TTS- Nephrology following. HD tomorrow.  Dispo: Disposition is deferred at this time, awaiting improvement of current medical problems.  Anticipated discharge in approximately 1-2 day(s).   The patient does have a current PCP Ivin Poot, MD) and does need an [facility] hospital follow-up appointment after discharge.  The patient does not have transportation limitations that hinder transportation to clinic appointments.  .Services Needed at time of discharge: Y = Yes, Blank = No PT:   OT:   RN:   Equipment:   Other:     LOS: 4 days   Corky Sox, MD 11/15/2012, 1:53 PM Pager: [phone]

## 2012-11-15 NOTE — Progress Notes (Signed)
I saw the patient and agree with the above assessment and plan.    Ca corrects too high, have reduced Hectorol to 2qTx from6.

## 2012-11-15 NOTE — Progress Notes (Signed)
[facility]   Day #5 vancomycin  Day #5 aztreonam  Subjective: " I want to go home"  Antibiotics:  Anti-infectives   Start     Dose/Rate Route Frequency Ordered Stop   11/13/12 2000  levofloxacin (LEVAQUIN) IVPB 500 mg  Status:  Discontinued     500 mg 100 mL/hr over 60 Minutes Intravenous Every 48 hours 11/11/12 2011 11/11/12 2320   11/13/12 1200  vancomycin (VANCOCIN) IVPB 1000 mg/200 mL premix     1,000 mg 200 mL/hr over 60 Minutes Intravenous Every T-Th-Sa (Hemodialysis) 11/12/12 1434     11/12/12 2100  aztreonam (AZACTAM) 1 g in dextrose 5 % 50 mL IVPB     1 g 100 mL/hr over 30 Minutes Intravenous Every 24 hours 11/11/12 2011     11/12/12 1445  vancomycin (VANCOCIN) IVPB 1000 mg/200 mL premix     1,000 mg 200 mL/hr over 60 Minutes Intravenous  Once 11/12/12 1434 11/12/12 1606   11/12/12 1200  lamiVUDine (EPIVIR) 10 MG/ML solution 50 mg     50 mg Oral Daily 11/11/12 2322     11/12/12 1200  abacavir (ZIAGEN) tablet 600 mg     600 mg Oral Daily 11/11/12 2322     11/12/12 0700  Darunavir Ethanolate (PREZISTA) tablet 800 mg     800 mg Oral Daily with breakfast 11/11/12 2322     11/12/12 0700  ritonavir (NORVIR) tablet 100 mg     100 mg Oral Daily with breakfast 11/11/12 2322     11/11/12 2030  vancomycin (VANCOCIN) IVPB 750 mg/150 ml premix     750 mg 150 mL/hr over 60 Minutes Intravenous  Once 11/11/12 2029 11/12/12 0053   11/11/12 1945  levofloxacin (LEVAQUIN) IVPB 750 mg     750 mg 100 mL/hr over 90 Minutes Intravenous  Once 11/11/12 1940 11/11/12 2200   11/11/12 1945  aztreonam (AZACTAM) 2 g in dextrose 5 % 50 mL IVPB     2 g 100 mL/hr over 30 Minutes Intravenous  Once 11/11/12 1940 11/11/12 2250   11/11/12 1945  vancomycin (VANCOCIN) IVPB 1000 mg/200 mL premix  Status:  Discontinued     1,000 mg 200 mL/hr over 60 Minutes Intravenous  Once 11/11/12 1940 11/12/12 1433      Medications: Scheduled Meds: . abacavir  600 mg Oral Q1200  .  aspirin  81 mg Oral Q1200  . aztreonam  1 g Intravenous Q24H  . calcium acetate  1,334 mg Oral TID WC  . Darunavir Ethanolate  800 mg Oral Q breakfast  . [START ON 11/16/2012] doxercalciferol  2 mcg Intravenous Q T,Th,Sa-HD  . heparin  5,000 Units Subcutaneous Q8H  . lamiVUDine  50 mg Oral Q1200  . midodrine  10 mg Oral BID  . multivitamin  1 tablet Oral QHS  . ritonavir  100 mg Oral Q breakfast  . sodium chloride  3 mL Intravenous Q12H  . vancomycin  1,000 mg Intravenous Q T,Th,Sa-HD   Continuous Infusions:  PRN Meds:.oxyCODONE   Objective: Weight change: -9 lb 11.2 oz (-4.4 kg)  Intake/Output Summary (Last 24 hours) at 11/15/12 1245 Last data filed at 11/14/12 1804  Gross per 24 hour  Intake    240 ml  Output      0 ml  Net    240 ml   Blood pressure 93/42, pulse 68, temperature 97.5 F (36.4 C), temperature source Oral, resp. rate 18, height 5\' 8"  (1.727 m), weight 197 lb  8.5 oz (89.6 kg), SpO2 100.00%. Temp:  [97.4 F (36.3 C)-98 F (36.7 C)] 97.5 F (36.4 C) (11/10 0701) Pulse Rate:  [60-73] 68 (11/10 0701) Resp:  [18] 18 (11/10 0701) BP: (93-149)/(42-86) 93/42 mmHg (11/10 0701) SpO2:  [99 %-100 %] 100 % (11/10 0701) Weight:  [197 lb 8.5 oz (89.6 kg)] 197 lb 8.5 oz (89.6 kg) (11/09 2112)  Physical Exam: General: Alert and awake, oriented x3,  HEENT: anicteric sclera, pupils reactive to light and accommodation, EOMI CVS regular rate, II/VI systolic murmur Chest: clear to auscultation bilaterally, no wheezing, rales or rhonchi Abdomen: soft nontender, nondistended, normal bowel sounds, Extremities HD graft site clean Skin: no rashes Neuro: nonfocal  Lab Results:  Recent Labs  11/14/12 0620 11/15/12 0735  WBC 3.6* 3.2*  HGB 11.8* 12.1  HCT 34.6* 35.4*  PLT 147* 153    BMET  Recent Labs  11/14/12 0620 11/15/12 0735  NA 133* 132*  K 4.6 4.2  CL 92* 93*  CO2 25 26  GLUCOSE 83 81  BUN 24* 41*  CREATININE 5.40* 7.48*  CALCIUM 9.9 10.3    Micro  Results: Recent Results (from the past 240 hour(s))  CULTURE, BLOOD (ROUTINE X 2)     Status: None   Collection Time    11/11/12  6:40 PM      Result Value Range Status   Specimen Description BLOOD FOREARM LEFT   Final   Special Requests BOTTLES DRAWN AEROBIC ONLY 8CC   Final   Culture  Setup Time     Final   Value: 11/12/2012 03:05     Performed at [facility]   Culture     Final   Value:        BLOOD CULTURE RECEIVED NO GROWTH TO DATE CULTURE WILL BE HELD FOR 5 DAYS BEFORE ISSUING A FINAL NEGATIVE REPORT     Performed at [facility]   Report Status PENDING   Incomplete  CULTURE, BLOOD (ROUTINE X 2)     Status: None   Collection Time    11/11/12  6:53 PM      Result Value Range Status   Specimen Description BLOOD ARM LEFT   Final   Special Requests BOTTLES DRAWN AEROBIC AND ANAEROBIC 10CC   Final   Culture  Setup Time     Final   Value: 11/12/2012 03:04     Performed at [facility]   Culture     Final   Value:        BLOOD CULTURE RECEIVED NO GROWTH TO DATE CULTURE WILL BE HELD FOR 5 DAYS BEFORE ISSUING A FINAL NEGATIVE REPORT     Performed at [facility]   Report Status PENDING   Incomplete  MRSA PCR SCREENING     Status: None   Collection Time    11/11/12 11:15 PM      Result Value Range Status   MRSA by PCR NEGATIVE  NEGATIVE Final   Comment:            The GeneXpert MRSA Assay (FDA     approved for NASAL specimens     only), is one component of a     comprehensive MRSA colonization     surveillance program. It is not     intended to diagnose MRSA     infection nor to guide or     monitor treatment for     MRSA infections.    Studies/Results: No results found.  Assessment/Plan: [name] is a 57 y.o. female with  HIV (well controlled ( ESRD on HD with mx admissions for MSSA and MRSA bacteremia includign MSSA endocarditis recurrent CDI admitted for fever hypotension but no clear source  #1 FUO:  --would get CT chest  abdomen and pelvis to rule out occult abscess, can be done WITH IV and ORAL contrast --OK to continue her abx in the meantime --if CT is unrevealing would DC all of her abx  #2 HIV: continue her current regimen   LOS: 4 days   Alcide Evener 11/15/2012, 12:45 PM

## 2012-11-15 NOTE — Progress Notes (Signed)
  Date: 11/15/2012  Patient name: [name]  Medical record number: [id number]  Date of birth: January 30, 1955   This patient has been seen and the plan of care was discussed with the house staff. Please see their note for complete details. I concur with their findings with the following additions/corrections: Sitting in bed looking at menu. Denies CP, SOB, abdominal pain or pain at HD access site.  Follow BC. Continue Vanc and Aztreonam for now. Appreciate ID input.  CT C/A/P per ID to look for possible source per ID.  Dominic Pea, DO, [facility] 11/15/2012, 12:05 PM

## 2012-11-16 LAB — COMPREHENSIVE METABOLIC PANEL
ALT: 61 U/L — ABNORMAL HIGH (ref 0–35)
AST: 60 U/L — ABNORMAL HIGH (ref 0–37)
Albumin: 3.2 g/dL — ABNORMAL LOW (ref 3.5–5.2)
Alkaline Phosphatase: 199 U/L — ABNORMAL HIGH (ref 39–117)
BUN: 52 mg/dL — ABNORMAL HIGH (ref 6–23)
CO2: 22 mEq/L (ref 19–32)
Calcium: 10.2 mg/dL (ref 8.4–10.5)
Chloride: 91 mEq/L — ABNORMAL LOW (ref 96–112)
Creatinine, Ser: 9.51 mg/dL — ABNORMAL HIGH (ref 0.50–1.10)
GFR calc Af Amer: 5 mL/min — ABNORMAL LOW (ref >90)
GFR calc non Af Amer: 4 mL/min — ABNORMAL LOW (ref >90)
Glucose, Bld: 80 mg/dL (ref 70–99)
Potassium: 5 mEq/L (ref 3.5–5.1)
Sodium: 129 mEq/L — ABNORMAL LOW (ref 135–145)
Total Bilirubin: 0.5 mg/dL (ref 0.3–1.2)
Total Protein: 8.4 g/dL — ABNORMAL HIGH (ref 6.0–8.3)

## 2012-11-16 LAB — CBC
HCT: 33.1 % — ABNORMAL LOW (ref 36.0–46.0)
Hemoglobin: 11.4 g/dL — ABNORMAL LOW (ref 12.0–15.0)
MCH: 37.1 pg — ABNORMAL HIGH (ref 26.0–34.0)
MCHC: 34.4 g/dL (ref 30.0–36.0)
MCV: 107.8 fL — ABNORMAL HIGH (ref 78.0–100.0)
Platelets: 168 10*3/uL (ref 150–400)
RBC: 3.07 MIL/uL — ABNORMAL LOW (ref 3.87–5.11)
RDW: 12.4 % (ref 11.5–15.5)
WBC: 4 10*3/uL (ref 4.0–10.5)

## 2012-11-16 LAB — RENAL FUNCTION PANEL
Albumin: 3 g/dL — ABNORMAL LOW (ref 3.5–5.2)
BUN: 53 mg/dL — ABNORMAL HIGH (ref 6–23)
CO2: 22 mEq/L (ref 19–32)
Calcium: 10.1 mg/dL (ref 8.4–10.5)
Chloride: 91 mEq/L — ABNORMAL LOW (ref 96–112)
Creatinine, Ser: 9.77 mg/dL — ABNORMAL HIGH (ref 0.50–1.10)
GFR calc Af Amer: 5 mL/min — ABNORMAL LOW (ref >90)
GFR calc non Af Amer: 4 mL/min — ABNORMAL LOW (ref >90)
Glucose, Bld: 154 mg/dL — ABNORMAL HIGH (ref 70–99)
Phosphorus: 3.8 mg/dL (ref 2.3–4.6)
Potassium: 4.6 mEq/L (ref 3.5–5.1)
Sodium: 130 mEq/L — ABNORMAL LOW (ref 135–145)

## 2012-11-16 LAB — CBC WITH DIFFERENTIAL/PLATELET
Basophils Absolute: 0 10*3/uL (ref 0.0–0.1)
Basophils Relative: 0 % (ref 0–1)
Eosinophils Absolute: 0.1 10*3/uL (ref 0.0–0.7)
Eosinophils Relative: 2 % (ref 0–5)
HCT: 33.1 % — ABNORMAL LOW (ref 36.0–46.0)
Hemoglobin: 11.3 g/dL — ABNORMAL LOW (ref 12.0–15.0)
Lymphocytes Relative: 14 % (ref 12–46)
Lymphs Abs: 0.7 10*3/uL (ref 0.7–4.0)
MCH: 36.5 pg — ABNORMAL HIGH (ref 26.0–34.0)
MCHC: 34.1 g/dL (ref 30.0–36.0)
MCV: 106.8 fL — ABNORMAL HIGH (ref 78.0–100.0)
Monocytes Absolute: 0.6 10*3/uL (ref 0.1–1.0)
Monocytes Relative: 13 % — ABNORMAL HIGH (ref 3–12)
Neutro Abs: 3.3 10*3/uL (ref 1.7–7.7)
Neutrophils Relative %: 71 % (ref 43–77)
Platelets: 172 10*3/uL (ref 150–400)
RBC: 3.1 MIL/uL — ABNORMAL LOW (ref 3.87–5.11)
RDW: 12.5 % (ref 11.5–15.5)
WBC: 4.7 10*3/uL (ref 4.0–10.5)

## 2012-11-16 MED ORDER — MIDODRINE HCL 5 MG PO TABS
ORAL_TABLET | ORAL | Status: AC
  Filled 2012-11-16: qty 1

## 2012-11-16 MED ORDER — DOXERCALCIFEROL 4 MCG/2ML IV SOLN
INTRAVENOUS | Status: AC
  Filled 2012-11-16: qty 2

## 2012-11-16 MED ORDER — MIDODRINE HCL 5 MG PO TABS
ORAL_TABLET | ORAL | Status: AC
  Administered 2012-11-16: 10 mg via ORAL
  Filled 2012-11-16: qty 1

## 2012-11-16 NOTE — Progress Notes (Signed)
Pt with a fever of 100.9 this am with morning vitals. Page to [phone] for notification with call back from Dr. Heber Carson. New order for Blood cultures. [name]

## 2012-11-16 NOTE — Progress Notes (Signed)
Subjective: [name] is a 57 year old female with PMH of ESRD on HD TTS, Pancytopenia (baseline Hgb ~10), HIV (CD4 320, undect viral load on 09/13/12), and multiple hospitalizations for fever and bacteremia, found to be febrile to 104 at HD on 11/11/12.   Patient seen in HD this AM. Still appears clinically well. Mild fever of 100.9 this morning, 100.6 on repeat, 99.2 most recently. Reports no complaints at this time.   Objective: Vital signs in last 24 hours: Filed Vitals:   11/15/12 2036 11/16/12 0517 11/16/12 0640 11/16/12 0818  BP: 120/43 122/45  105/62  Pulse: 93 95  20  Temp: 98.1 F (36.7 C) 100.9 F (38.3 C) 100.6 F (38.1 C) 98.4 F (36.9 C)  TempSrc: Oral Oral Oral Oral  Resp: 18 18  18   Height:      Weight: 197 lb 8.5 oz (89.6 kg)     SpO2: 94% 96%  96%   Weight change: -0 oz (-0 kg)  Intake/Output Summary (Last 24 hours) at 11/16/12 [id number] Last data filed at 11/16/12 0518  Gross per 24 hour  Intake    720 ml  Output      0 ml  Net    720 ml   Physical Exam: General: Alert, cooperative, and in no apparent distress HEENT: Vision grossly intact, oropharynx clear and non-erythematous  Neck: Full range of motion without pain, supple, no lymphadenopathy or carotid bruits Lungs: Clear to ascultation bilaterally, normal work of respiration, no wheezes, rales, ronchi Heart: Regular rate and rhythm, 2/6 systolic murmur, no gallops, or rubs Abdomen: Soft, non-tender, non-distended, normal bowel sounds Extremities: No cyanosis, clubbing, or edema. Multiple scars in UE's from previous grafts in the past. AVG in left thigh w/ thrill and bruit. Neurologic: Alert & oriented X3, cranial nerves II-XII intact, strength grossly intact, sensation intact to light touch  Lab Results: Basic Metabolic Panel:  Recent Labs Lab 11/13/12 0748  11/15/12 0735 11/16/12 0622  NA 130*  < > 132* 129*  K 4.6  < > 4.2 5.0  CL 91*  < > 93* 91*  CO2 24  < > 26 22  GLUCOSE 120*  < > 81  80  BUN 52*  < > 41* 52*  CREATININE 8.46*  < > 7.48* 9.51*  CALCIUM 9.5  < > 10.3 10.2  PHOS 5.3*  --   --   --   < > = values in this interval not displayed. Liver Function Tests:  Recent Labs Lab 11/15/12 0735 11/16/12 0622  AST 88* 60*  ALT 86* 61*  ALKPHOS 209* 199*  BILITOT 0.5 0.5  PROT 8.9* 8.4*  ALBUMIN 3.3* 3.2*   CBC:  Recent Labs Lab 11/14/12 0620 11/15/12 0735 11/16/12 0622  WBC 3.6* 3.2* 4.7  NEUTROABS 0.6*  --  3.3  HGB 11.8* 12.1 11.3*  HCT 34.6* 35.4* 33.1*  MCV 109.5* 108.6* 106.8*  PLT 147* 153 172   Coagulation:  Recent Labs Lab 11/12/12 0125  LABPROT 14.8  INR 1.19    Micro Results: Recent Results (from the past 240 hour(s))  CULTURE, BLOOD (ROUTINE X 2)     Status: None   Collection Time    11/11/12  6:40 PM      Result Value Range Status   Specimen Description BLOOD FOREARM LEFT   Final   Special Requests BOTTLES DRAWN AEROBIC ONLY 8CC   Final   Culture  Setup Time     Final   Value:  11/12/2012 03:05     Performed at [facility]   Culture     Final   Value:        BLOOD CULTURE RECEIVED NO GROWTH TO DATE CULTURE WILL BE HELD FOR 5 DAYS BEFORE ISSUING A FINAL NEGATIVE REPORT     Performed at [facility]   Report Status PENDING   Incomplete  CULTURE, BLOOD (ROUTINE X 2)     Status: None   Collection Time    11/11/12  6:53 PM      Result Value Range Status   Specimen Description BLOOD ARM LEFT   Final   Special Requests BOTTLES DRAWN AEROBIC AND ANAEROBIC 10CC   Final   Culture  Setup Time     Final   Value: 11/12/2012 03:04     Performed at [facility]   Culture     Final   Value:        BLOOD CULTURE RECEIVED NO GROWTH TO DATE CULTURE WILL BE HELD FOR 5 DAYS BEFORE ISSUING A FINAL NEGATIVE REPORT     Performed at [facility]   Report Status PENDING   Incomplete  MRSA PCR SCREENING     Status: None   Collection Time    11/11/12 11:15 PM      Result Value Range Status   MRSA by PCR  NEGATIVE  NEGATIVE Final   Comment:            The GeneXpert MRSA Assay (FDA     approved for NASAL specimens     only), is one component of a     comprehensive MRSA colonization     surveillance program. It is not     intended to diagnose MRSA     infection nor to guide or     monitor treatment for     MRSA infections.   Studies/Results: Ct Chest W Contrast  11/15/2012   CLINICAL DATA:  Fever. Unknown origin. HIV. Cardiomyopathy. Bacteremia. Heart failure.  EXAM: CT CHEST, ABDOMEN, AND PELVIS WITH CONTRAST  TECHNIQUE: Multidetector CT imaging of the chest, abdomen and pelvis was performed following the standard protocol during bolus administration of intravenous contrast.  CONTRAST:  119mL OMNIPAQUE IOHEXOL 300 MG/ML  SOLN  COMPARISON:  Plain film chest 11/11/2012. Chest CT 12/21/2011. Abdominal pelvic CT 07/11/2011.  FINDINGS: CT CHEST FINDINGS  Lungs/Pleura: Mild degradation by patient body habitus. Interval resolution of the previously described left upper lobe airspace disease since 12/21/2011.  No pleural fluid.  Heart/Mediastinum: No definite supraclavicular adenopathy. Nonspecific subcentimeter right-sided thyroid nodule.  Right axillary adenopathy is decreased. Example 1.1 cm on image 11 versus 1.2 cm on the prior. Similar improvement in left axillary and bilateral subpectoral adenopathy.  Tortuous thoracic aorta. Mild cardiomegaly with venous collaterals about the mediastinum. Likely due to venous insufficiency, possibly in the left brachiocephalic vein.  Pulmonary artery enlargement, with the outflow tract measuring 3.8 cm. No central pulmonary embolism, on this non-dedicated study.  Right paratracheal node measures 9 mm on image 16 versus 1.3 cm on the prior. No hilar adenopathy. Resolved azgo-esophageal recess adenopathy.  CT ABDOMEN AND PELVIS FINDINGS  Abdomen/Pelvis: Normal liver, spleen, stomach, pancreas. Gallstones. Contracted gallbladder, without specific evidence of acute  cholecystitis. No biliary ductal dilatation.  Normal adrenal glands. Markedly atrophic bilateral kidneys. Low-density left renal lesions which are likely cysts. Small retroperitoneal nodes which are decreased in size since the prior exam and not pathologic by size criteria.  Normal colon and terminal  ileum. Normal small bowel without abdominal ascites.  Surgical changes in the left inguinal region. Mildly prominent inguinal nodes are decreased since the prior exam. Decompressed urinary bladder. Soft tissue fullness within a retroverted uterus, suspicious for underlying fibroids. No adnexal mass or significant free fluid.  Bones/Musculoskeletal: Probable renal osteodystrophy with increased density of the marrow space. Loss of intervertebral disc height at the lumbosacral junction is chronic and likely related to degenerative disc disease. Concurrent disc bulge at this level.  IMPRESSION: CT CHEST IMPRESSION  1. Since 12/21/2011, interval improvement in thoracic adenopathy. Favored to be reactive, and possibly related to the clinical history of HIV. 2. Otherwise, no explanation for fever within the chest. Resolution of left upper lobe pneumonia. 3. Pulmonary artery enlargement suggests pulmonary arterial hypertension. 4. Centrally venous insufficiency with venous collaterals throughout the mediastinum. Favor related to left brachiocephalic vein insufficiency.  CT ABDOMEN AND PELVIS IMPRESSION  1. No explanation for fever within the abdomen or pelvis. 2. Gallstones with a contracted gallbladder. Cannot exclude a component of chronic cholecystitis. No evidence of acute inflammation. 3. Soft tissue fullness within the uterus. Question underlying fibroids. 4. Prominent inguinal nodes which are improved since the prior exam and likely reactive.   Electronically Signed   By: Abigail Miyamoto M.D.   On: 11/15/2012 18:41   Ct Abdomen Pelvis W Contrast  11/15/2012   CLINICAL DATA:  Fever. Unknown origin. HIV. Cardiomyopathy.  Bacteremia. Heart failure.  EXAM: CT CHEST, ABDOMEN, AND PELVIS WITH CONTRAST  TECHNIQUE: Multidetector CT imaging of the chest, abdomen and pelvis was performed following the standard protocol during bolus administration of intravenous contrast.  CONTRAST:  119mL OMNIPAQUE IOHEXOL 300 MG/ML  SOLN  COMPARISON:  Plain film chest 11/11/2012. Chest CT 12/21/2011. Abdominal pelvic CT 07/11/2011.  FINDINGS: CT CHEST FINDINGS  Lungs/Pleura: Mild degradation by patient body habitus. Interval resolution of the previously described left upper lobe airspace disease since 12/21/2011.  No pleural fluid.  Heart/Mediastinum: No definite supraclavicular adenopathy. Nonspecific subcentimeter right-sided thyroid nodule.  Right axillary adenopathy is decreased. Example 1.1 cm on image 11 versus 1.2 cm on the prior. Similar improvement in left axillary and bilateral subpectoral adenopathy.  Tortuous thoracic aorta. Mild cardiomegaly with venous collaterals about the mediastinum. Likely due to venous insufficiency, possibly in the left brachiocephalic vein.  Pulmonary artery enlargement, with the outflow tract measuring 3.8 cm. No central pulmonary embolism, on this non-dedicated study.  Right paratracheal node measures 9 mm on image 16 versus 1.3 cm on the prior. No hilar adenopathy. Resolved azgo-esophageal recess adenopathy.  CT ABDOMEN AND PELVIS FINDINGS  Abdomen/Pelvis: Normal liver, spleen, stomach, pancreas. Gallstones. Contracted gallbladder, without specific evidence of acute cholecystitis. No biliary ductal dilatation.  Normal adrenal glands. Markedly atrophic bilateral kidneys. Low-density left renal lesions which are likely cysts. Small retroperitoneal nodes which are decreased in size since the prior exam and not pathologic by size criteria.  Normal colon and terminal ileum. Normal small bowel without abdominal ascites.  Surgical changes in the left inguinal region. Mildly prominent inguinal nodes are decreased since the  prior exam. Decompressed urinary bladder. Soft tissue fullness within a retroverted uterus, suspicious for underlying fibroids. No adnexal mass or significant free fluid.  Bones/Musculoskeletal: Probable renal osteodystrophy with increased density of the marrow space. Loss of intervertebral disc height at the lumbosacral junction is chronic and likely related to degenerative disc disease. Concurrent disc bulge at this level.  IMPRESSION: CT CHEST IMPRESSION  1. Since 12/21/2011, interval improvement in thoracic adenopathy. Favored to  be reactive, and possibly related to the clinical history of HIV. 2. Otherwise, no explanation for fever within the chest. Resolution of left upper lobe pneumonia. 3. Pulmonary artery enlargement suggests pulmonary arterial hypertension. 4. Centrally venous insufficiency with venous collaterals throughout the mediastinum. Favor related to left brachiocephalic vein insufficiency.  CT ABDOMEN AND PELVIS IMPRESSION  1. No explanation for fever within the abdomen or pelvis. 2. Gallstones with a contracted gallbladder. Cannot exclude a component of chronic cholecystitis. No evidence of acute inflammation. 3. Soft tissue fullness within the uterus. Question underlying fibroids. 4. Prominent inguinal nodes which are improved since the prior exam and likely reactive.   Electronically Signed   By: Abigail Miyamoto M.D.   On: 11/15/2012 18:41   Medications: I have reviewed the patient's current medications. Scheduled Meds: . abacavir  600 mg Oral Q1200  . aspirin  81 mg Oral Q1200  . aztreonam  1 g Intravenous Q24H  . calcium acetate  1,334 mg Oral TID WC  . Darunavir Ethanolate  800 mg Oral Q breakfast  . doxercalciferol  2 mcg Intravenous Q T,Th,Sa-HD  . heparin  5,000 Units Subcutaneous Q8H  . lamiVUDine  50 mg Oral Q1200  . midodrine  10 mg Oral BID  . multivitamin  1 tablet Oral QHS  . ritonavir  100 mg Oral Q breakfast  . sodium chloride  3 mL Intravenous Q12H  . vancomycin   1,000 mg Intravenous Q T,Th,Sa-HD   Continuous Infusions:  PRN Meds:.oxyCODONE  Assessment/Plan: [name] is a 58 year old female with PMH of ESRD on HD TTS, Pancytopenia (baseline Hgb ~10), HIV (CD4 320, undect viral load on 09/13/12), and multiple hospitalizations for fever and bacteremia, found to be febrile to 104 at HD on 11/11/12.   Patient with extensive ID history in the past as follows: 02/11/12 --> MRSA Bacteremia with HCAP, blood cx 2/6, leukocytosis (10.9), Tm 104 (TEE - no vegetations, vascular revised graft - not infected) --> treated with Vanc, Cefepime, Zyvox, Ancef --> Vanc x 6 weeks 12/19/11 --> HCAP with possible bacteremia, blood cx 1/4 gram + cocci in pairs & chains (ID thought contaminant), normal wbc, Tm 105.8 --> treated with levaquin, aztreonam, daptomycin (given VRE hx), Vanc  08/21/11 --> VRE bacteremia & C.diff, 4/4 blood cx & right chest HD catheter tip + enterococcus 80K colonies, WBC 2.8, Abs Neut 1.6, Tm 103, TEE with small calcified mobile mass on chordae tendinae --> treated with Rocephin, vanc, zyvoxx, Daptomycin --> Dapto x 2 weeks  07/11/11 --> C diff with Tm 103.6, WBC 1.6  05/18/11 --> Bacteremia (likely d/t HD catheter) with bacterial endocarditis & HCAP --> Vanc/Zosyn then Ancef x 6 weeks  12/23/10 --> MSSA bacteremia with RLL PNA --> Ancef x 6 weeks  Fever- Patient came from HD center with fever of 104. NO cultures drawn at dialysis center. Met SIRS criteria w/ fever and WBC's of 2.9. At this time there is no obvious source of infection. CXR portable 1 view on admission showed no sign of respiratory source and patient denies cough, patient does not make urine, no signs of obvious infection at fistula site, no HA, AMS, or stiff neck, no diarrhea. Lactic acid was wnl on admission. Blood cultures drawn, still with no growth (01/16/12). Mild fever this AM, 100.9 Tmax. Blood cultures repeated. Given h/o endocarditis in the past and recurrence of fever on Abx, consider  TEE if further fever as patient is high risk for endocarditis. However, only fulfills 2  minor [facility] criteria at this time. -ID consulted, appreciate recommendations. CT chest/abd/pelvis shows no explanation for fever at this time (see results above), except for possible chronic cholecystitis. If fever continues, will consider TEE + add oral Flagyl.   Hypotension- Presented to ED w/ BP of 65/25. Improved after patient was bolused w/ 500 cc. Has had multiple UE vascular surgeries. -Continue Midodrine -Continue to monitor pressures on thigh if necessary, will bolus if hypotensive.   Transaminitis- AST 25 in May 2014. AST and ALT elevated to ~5 times upper limit on admission. Patient has no history of Hep B or C. She does have a history of gallstones but does not currently have any abdominal pain. CT abdomen showed possible chronic cholecystitis, may explain transaminitis.  -Repeat LFT's this AM continue to trend down (11/16/12). Will continue to follow.   History of Endocarditis- Patient shown to have nodule on cordae in 08/2011 on TEE during admission for VRE bacteremia. Treated with Rocephin, vanc, zyvoxx, Daptomycin (dapto continued for 2 weeks). -Consider TEE if BCx + and fever continues. - Patient does have a systolic murmur that was present on previous admissions (at least as early as Dec 2012), no noted MR, AS, or PS on TEE in February 2014.  HIV -CD4 320 in September with undect viral load  -Continue home HIV medications   Pancytopenia (chronic) -Chronic, patient did have a bone marrow Bx in 2012 that was hypercellular thought likely due to uncontrolled HIV. However last CBC in September had WBC of 4.1, mild anemia, and platelets of 170.   ESRD (end stage renal disease) on HD TTS- Nephrology following. HD today.  Dispo: Disposition is deferred at this time, awaiting improvement of current medical problems.  Anticipated discharge in approximately 1-2 day(s).   The patient does have a  current PCP Ivin Poot, MD) and does need an [facility] hospital follow-up appointment after discharge.  The patient does not have transportation limitations that hinder transportation to clinic appointments.  .Services Needed at time of discharge: Y = Yes, Blank = No PT:   OT:   RN:   Equipment:   Other:     LOS: 5 days   Corky Sox, MD 11/16/2012, 8:38 AM Pager: [phone]

## 2012-11-16 NOTE — Clinical Social Work Note (Addendum)
CSW continuing to monitor patient's progress and will assist with patient's discharge back to [facility] when medically stable.  11/18/12 - Patient discharged back to [facility] via ambulance. Discharge information forwarded to facility and d/c packet accompanied patient to facility.  Derec Mozingo Givens, MSW, LCSW [phone]

## 2012-11-16 NOTE — Procedures (Signed)
I was present at this dialysis session. I have reviewed the session itself and made appropriate changes.   Qb 400 AVG and goal UF 0.9L.  Some mild hypotension, midodrine given, asymptoamtic.    Lab Results  Component Value Date   CREATININE 9.51* 11/16/2012   BUN 52* 11/16/2012   NA 129* 11/16/2012   K 5.0 11/16/2012   CL 91* 11/16/2012   CO2 22 11/16/2012   Lab Results  Component Value Date   WBC 4.7 11/16/2012   HGB 11.3* 11/16/2012   HCT 33.1* 11/16/2012   MCV 106.8* 11/16/2012   PLT 172 11/16/2012    Pearson Grippe  MD 11/16/2012, 9:21 AM

## 2012-11-16 NOTE — Progress Notes (Signed)
[facility]    Day #6 vancomycin  Day #6 aztreonam  Subjective: No new complaints  Antibiotics:  Anti-infectives   Start     Dose/Rate Route Frequency Ordered Stop   11/13/12 2000  levofloxacin (LEVAQUIN) IVPB 500 mg  Status:  Discontinued     500 mg 100 mL/hr over 60 Minutes Intravenous Every 48 hours 11/11/12 2011 11/11/12 2320   11/13/12 1200  vancomycin (VANCOCIN) IVPB 1000 mg/200 mL premix     1,000 mg 200 mL/hr over 60 Minutes Intravenous Every T-Th-Sa (Hemodialysis) 11/12/12 1434     11/12/12 2100  aztreonam (AZACTAM) 1 g in dextrose 5 % 50 mL IVPB     1 g 100 mL/hr over 30 Minutes Intravenous Every 24 hours 11/11/12 2011     11/12/12 1445  vancomycin (VANCOCIN) IVPB 1000 mg/200 mL premix     1,000 mg 200 mL/hr over 60 Minutes Intravenous  Once 11/12/12 1434 11/12/12 1606   11/12/12 1200  lamiVUDine (EPIVIR) 10 MG/ML solution 50 mg     50 mg Oral Daily 11/11/12 2322     11/12/12 1200  abacavir (ZIAGEN) tablet 600 mg     600 mg Oral Daily 11/11/12 2322     11/12/12 0700  Darunavir Ethanolate (PREZISTA) tablet 800 mg     800 mg Oral Daily with breakfast 11/11/12 2322     11/12/12 0700  ritonavir (NORVIR) tablet 100 mg     100 mg Oral Daily with breakfast 11/11/12 2322     11/11/12 2030  vancomycin (VANCOCIN) IVPB 750 mg/150 ml premix     750 mg 150 mL/hr over 60 Minutes Intravenous  Once 11/11/12 2029 11/12/12 0053   11/11/12 1945  levofloxacin (LEVAQUIN) IVPB 750 mg     750 mg 100 mL/hr over 90 Minutes Intravenous  Once 11/11/12 1940 11/11/12 2200   11/11/12 1945  aztreonam (AZACTAM) 2 g in dextrose 5 % 50 mL IVPB     2 g 100 mL/hr over 30 Minutes Intravenous  Once 11/11/12 1940 11/11/12 2250   11/11/12 1945  vancomycin (VANCOCIN) IVPB 1000 mg/200 mL premix  Status:  Discontinued     1,000 mg 200 mL/hr over 60 Minutes Intravenous  Once 11/11/12 1940 11/12/12 1433      Medications: Scheduled Meds: . abacavir  600 mg Oral Q1200  .  aspirin  81 mg Oral Q1200  . aztreonam  1 g Intravenous Q24H  . calcium acetate  1,334 mg Oral TID WC  . Darunavir Ethanolate  800 mg Oral Q breakfast  . doxercalciferol  2 mcg Intravenous Q T,Th,Sa-HD  . heparin  5,000 Units Subcutaneous Q8H  . lamiVUDine  50 mg Oral Q1200  . midodrine  10 mg Oral BID  . multivitamin  1 tablet Oral QHS  . ritonavir  100 mg Oral Q breakfast  . sodium chloride  3 mL Intravenous Q12H  . vancomycin  1,000 mg Intravenous Q T,Th,Sa-HD   Continuous Infusions:  PRN Meds:.oxyCODONE   Objective: Weight change: -0 oz (-0 kg)  Intake/Output Summary (Last 24 hours) at 11/16/12 1723 Last data filed at 11/16/12 1550  Gross per 24 hour  Intake    360 ml  Output    472 ml  Net   -112 ml   Blood pressure 90/66, pulse 76, temperature 98.1 F (36.7 C), temperature source Oral, resp. rate 15, height 5\' 8"  (1.727 m), weight 205 lb 4 oz (93.1 kg), SpO2 100.00%. Temp:  [98.1 F (  36.7 C)-100.9 F (38.3 C)] 98.1 F (36.7 C) (11/11 1300) Pulse Rate:  [20-95] 76 (11/11 1300) Resp:  [13-21] 15 (11/11 1300) BP: (56-133)/(32-87) 90/66 mmHg (11/11 1300) SpO2:  [89 %-100 %] 100 % (11/11 1300) Weight:  [197 lb 8.5 oz (89.6 kg)-206 lb 5.6 oz (93.6 kg)] 205 lb 4 oz (93.1 kg) (11/11 1300)  Physical Exam: General: Alert and awake, oriented x3,  HEENT: anicteric sclera, pupils reactive to light and accommodation, EOMI CVS regular rate, II/VI systolic murmur Chest: clear to auscultation bilaterally, no wheezing, rales or rhonchi Abdomen: soft nontender, nondistended, normal bowel sounds, Extremities HD graft site clean Skin: no rashes Neuro: nonfocal  Lab Results:  Recent Labs  11/16/12 0622 11/16/12 0912  WBC 4.7 4.0  HGB 11.3* 11.4*  HCT 33.1* 33.1*  PLT 172 168    BMET  Recent Labs  11/16/12 0622 11/16/12 0912  NA 129* 130*  K 5.0 4.6  CL 91* 91*  CO2 22 22  GLUCOSE 80 154*  BUN 52* 53*  CREATININE 9.51* 9.77*  CALCIUM 10.2 10.1    Micro  Results: Recent Results (from the past 240 hour(s))  CULTURE, BLOOD (ROUTINE X 2)     Status: None   Collection Time    11/11/12  6:40 PM      Result Value Range Status   Specimen Description BLOOD FOREARM LEFT   Final   Special Requests BOTTLES DRAWN AEROBIC ONLY 8CC   Final   Culture  Setup Time     Final   Value: 11/12/2012 03:05     Performed at [facility]   Culture     Final   Value:        BLOOD CULTURE RECEIVED NO GROWTH TO DATE CULTURE WILL BE HELD FOR 5 DAYS BEFORE ISSUING A FINAL NEGATIVE REPORT     Performed at [facility]   Report Status PENDING   Incomplete  CULTURE, BLOOD (ROUTINE X 2)     Status: None   Collection Time    11/11/12  6:53 PM      Result Value Range Status   Specimen Description BLOOD ARM LEFT   Final   Special Requests BOTTLES DRAWN AEROBIC AND ANAEROBIC 10CC   Final   Culture  Setup Time     Final   Value: 11/12/2012 03:04     Performed at [facility]   Culture     Final   Value:        BLOOD CULTURE RECEIVED NO GROWTH TO DATE CULTURE WILL BE HELD FOR 5 DAYS BEFORE ISSUING A FINAL NEGATIVE REPORT     Performed at [facility]   Report Status PENDING   Incomplete  MRSA PCR SCREENING     Status: None   Collection Time    11/11/12 11:15 PM      Result Value Range Status   MRSA by PCR NEGATIVE  NEGATIVE Final   Comment:            The GeneXpert MRSA Assay (FDA     approved for NASAL specimens     only), is one component of a     comprehensive MRSA colonization     surveillance program. It is not     intended to diagnose MRSA     infection nor to guide or     monitor treatment for     MRSA infections.    Studies/Results: Ct Chest W Contrast  11/15/2012   CLINICAL  DATA:  Fever. Unknown origin. HIV. Cardiomyopathy. Bacteremia. Heart failure.  EXAM: CT CHEST, ABDOMEN, AND PELVIS WITH CONTRAST  TECHNIQUE: Multidetector CT imaging of the chest, abdomen and pelvis was performed following the standard protocol  during bolus administration of intravenous contrast.  CONTRAST:  16mL OMNIPAQUE IOHEXOL 300 MG/ML  SOLN  COMPARISON:  Plain film chest 11/11/2012. Chest CT 12/21/2011. Abdominal pelvic CT 07/11/2011.  FINDINGS: CT CHEST FINDINGS  Lungs/Pleura: Mild degradation by patient body habitus. Interval resolution of the previously described left upper lobe airspace disease since 12/21/2011.  No pleural fluid.  Heart/Mediastinum: No definite supraclavicular adenopathy. Nonspecific subcentimeter right-sided thyroid nodule.  Right axillary adenopathy is decreased. Example 1.1 cm on image 11 versus 1.2 cm on the prior. Similar improvement in left axillary and bilateral subpectoral adenopathy.  Tortuous thoracic aorta. Mild cardiomegaly with venous collaterals about the mediastinum. Likely due to venous insufficiency, possibly in the left brachiocephalic vein.  Pulmonary artery enlargement, with the outflow tract measuring 3.8 cm. No central pulmonary embolism, on this non-dedicated study.  Right paratracheal node measures 9 mm on image 16 versus 1.3 cm on the prior. No hilar adenopathy. Resolved azgo-esophageal recess adenopathy.  CT ABDOMEN AND PELVIS FINDINGS  Abdomen/Pelvis: Normal liver, spleen, stomach, pancreas. Gallstones. Contracted gallbladder, without specific evidence of acute cholecystitis. No biliary ductal dilatation.  Normal adrenal glands. Markedly atrophic bilateral kidneys. Low-density left renal lesions which are likely cysts. Small retroperitoneal nodes which are decreased in size since the prior exam and not pathologic by size criteria.  Normal colon and terminal ileum. Normal small bowel without abdominal ascites.  Surgical changes in the left inguinal region. Mildly prominent inguinal nodes are decreased since the prior exam. Decompressed urinary bladder. Soft tissue fullness within a retroverted uterus, suspicious for underlying fibroids. No adnexal mass or significant free fluid.  Bones/Musculoskeletal:  Probable renal osteodystrophy with increased density of the marrow space. Loss of intervertebral disc height at the lumbosacral junction is chronic and likely related to degenerative disc disease. Concurrent disc bulge at this level.  IMPRESSION: CT CHEST IMPRESSION  1. Since 12/21/2011, interval improvement in thoracic adenopathy. Favored to be reactive, and possibly related to the clinical history of HIV. 2. Otherwise, no explanation for fever within the chest. Resolution of left upper lobe pneumonia. 3. Pulmonary artery enlargement suggests pulmonary arterial hypertension. 4. Centrally venous insufficiency with venous collaterals throughout the mediastinum. Favor related to left brachiocephalic vein insufficiency.  CT ABDOMEN AND PELVIS IMPRESSION  1. No explanation for fever within the abdomen or pelvis. 2. Gallstones with a contracted gallbladder. Cannot exclude a component of chronic cholecystitis. No evidence of acute inflammation. 3. Soft tissue fullness within the uterus. Question underlying fibroids. 4. Prominent inguinal nodes which are improved since the prior exam and likely reactive.   Electronically Signed   By: Abigail Miyamoto M.D.   On: 11/15/2012 18:41   Ct Abdomen Pelvis W Contrast  11/15/2012   CLINICAL DATA:  Fever. Unknown origin. HIV. Cardiomyopathy. Bacteremia. Heart failure.  EXAM: CT CHEST, ABDOMEN, AND PELVIS WITH CONTRAST  TECHNIQUE: Multidetector CT imaging of the chest, abdomen and pelvis was performed following the standard protocol during bolus administration of intravenous contrast.  CONTRAST:  116mL OMNIPAQUE IOHEXOL 300 MG/ML  SOLN  COMPARISON:  Plain film chest 11/11/2012. Chest CT 12/21/2011. Abdominal pelvic CT 07/11/2011.  FINDINGS: CT CHEST FINDINGS  Lungs/Pleura: Mild degradation by patient body habitus. Interval resolution of the previously described left upper lobe airspace disease since 12/21/2011.  No pleural fluid.  Heart/Mediastinum:  No definite supraclavicular  adenopathy. Nonspecific subcentimeter right-sided thyroid nodule.  Right axillary adenopathy is decreased. Example 1.1 cm on image 11 versus 1.2 cm on the prior. Similar improvement in left axillary and bilateral subpectoral adenopathy.  Tortuous thoracic aorta. Mild cardiomegaly with venous collaterals about the mediastinum. Likely due to venous insufficiency, possibly in the left brachiocephalic vein.  Pulmonary artery enlargement, with the outflow tract measuring 3.8 cm. No central pulmonary embolism, on this non-dedicated study.  Right paratracheal node measures 9 mm on image 16 versus 1.3 cm on the prior. No hilar adenopathy. Resolved azgo-esophageal recess adenopathy.  CT ABDOMEN AND PELVIS FINDINGS  Abdomen/Pelvis: Normal liver, spleen, stomach, pancreas. Gallstones. Contracted gallbladder, without specific evidence of acute cholecystitis. No biliary ductal dilatation.  Normal adrenal glands. Markedly atrophic bilateral kidneys. Low-density left renal lesions which are likely cysts. Small retroperitoneal nodes which are decreased in size since the prior exam and not pathologic by size criteria.  Normal colon and terminal ileum. Normal small bowel without abdominal ascites.  Surgical changes in the left inguinal region. Mildly prominent inguinal nodes are decreased since the prior exam. Decompressed urinary bladder. Soft tissue fullness within a retroverted uterus, suspicious for underlying fibroids. No adnexal mass or significant free fluid.  Bones/Musculoskeletal: Probable renal osteodystrophy with increased density of the marrow space. Loss of intervertebral disc height at the lumbosacral junction is chronic and likely related to degenerative disc disease. Concurrent disc bulge at this level.  IMPRESSION: CT CHEST IMPRESSION  1. Since 12/21/2011, interval improvement in thoracic adenopathy. Favored to be reactive, and possibly related to the clinical history of HIV. 2. Otherwise, no explanation for fever  within the chest. Resolution of left upper lobe pneumonia. 3. Pulmonary artery enlargement suggests pulmonary arterial hypertension. 4. Centrally venous insufficiency with venous collaterals throughout the mediastinum. Favor related to left brachiocephalic vein insufficiency.  CT ABDOMEN AND PELVIS IMPRESSION  1. No explanation for fever within the abdomen or pelvis. 2. Gallstones with a contracted gallbladder. Cannot exclude a component of chronic cholecystitis. No evidence of acute inflammation. 3. Soft tissue fullness within the uterus. Question underlying fibroids. 4. Prominent inguinal nodes which are improved since the prior exam and likely reactive.   Electronically Signed   By: Abigail Miyamoto M.D.   On: 11/15/2012 18:41      Assessment/Plan: [name] is a 57 y.o. 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 CT read ? Chronic cholecystitis and with LFT up at admission this could indeed have been a case of cholecystitis. She is pretty broadly covered though not for Anerobes or VRE  --would watch overnight 1-2 days --if fevers further would get blood cultures, TEE and add oral flagyl   #2 HIV: continue her current regimen   LOS: 5 days   Alcide Evener 11/16/2012, 5:23 PM

## 2012-11-16 NOTE — Progress Notes (Signed)
  Date: 11/16/2012  Patient name: [name]  Medical record number: [id number]  Date of birth: [birth year]   This patient has been seen and the plan of care was discussed with the house staff. Please see their note for complete details. I concur with their findings with the following additions/corrections: CT of the C/A/P performed. Mention of possible chronic cholecystitis? Her abdominal exam is soft, non tender with +BS. She was noted to be febrile early this morning again. BC repeated. She is noted to be only oriented to person and place this morning, not year. Would ask ID to weigh in on continuing IV abx (Vanc/ aztreonam) and possibly proceeding with TEE?  Dominic Pea, DO, [facility] 11/16/2012, 2:54 PM

## 2012-11-17 DIAGNOSIS — D709 Neutropenia, unspecified: Secondary | ICD-10-CM | POA: Diagnosis present

## 2012-11-17 LAB — COMPREHENSIVE METABOLIC PANEL
ALT: 50 U/L — ABNORMAL HIGH (ref 0–35)
AST: 52 U/L — ABNORMAL HIGH (ref 0–37)
Albumin: 3.1 g/dL — ABNORMAL LOW (ref 3.5–5.2)
Alkaline Phosphatase: 182 U/L — ABNORMAL HIGH (ref 39–117)
BUN: 19 mg/dL (ref 6–23)
CO2: 30 mEq/L (ref 19–32)
Calcium: 10.1 mg/dL (ref 8.4–10.5)
Chloride: 96 mEq/L (ref 96–112)
Creatinine, Ser: 5.71 mg/dL — ABNORMAL HIGH (ref 0.50–1.10)
GFR calc Af Amer: 9 mL/min — ABNORMAL LOW (ref >90)
GFR calc non Af Amer: 8 mL/min — ABNORMAL LOW (ref >90)
Glucose, Bld: 104 mg/dL — ABNORMAL HIGH (ref 70–99)
Potassium: 4.1 mEq/L (ref 3.5–5.1)
Sodium: 137 mEq/L (ref 135–145)
Total Bilirubin: 0.4 mg/dL (ref 0.3–1.2)
Total Protein: 8.5 g/dL — ABNORMAL HIGH (ref 6.0–8.3)

## 2012-11-17 LAB — CBC WITH DIFFERENTIAL/PLATELET
Basophils Absolute: 0 10*3/uL (ref 0.0–0.1)
Basophils Relative: 0 % (ref 0–1)
Eosinophils Absolute: 0.2 10*3/uL (ref 0.0–0.7)
Eosinophils Relative: 7 % — ABNORMAL HIGH (ref 0–5)
HCT: 33.5 % — ABNORMAL LOW (ref 36.0–46.0)
Hemoglobin: 11 g/dL — ABNORMAL LOW (ref 12.0–15.0)
Lymphocytes Relative: 40 % (ref 12–46)
Lymphs Abs: 1.2 10*3/uL (ref 0.7–4.0)
MCH: 36.1 pg — ABNORMAL HIGH (ref 26.0–34.0)
MCHC: 32.8 g/dL (ref 30.0–36.0)
MCV: 109.8 fL — ABNORMAL HIGH (ref 78.0–100.0)
Monocytes Absolute: 0.3 10*3/uL (ref 0.1–1.0)
Monocytes Relative: 9 % (ref 3–12)
Neutro Abs: 1.3 10*3/uL — ABNORMAL LOW (ref 1.7–7.7)
Neutrophils Relative %: 44 % (ref 43–77)
Platelets: 146 10*3/uL — ABNORMAL LOW (ref 150–400)
RBC: 3.05 MIL/uL — ABNORMAL LOW (ref 3.87–5.11)
RDW: 12.5 % (ref 11.5–15.5)
WBC: 2.9 10*3/uL — ABNORMAL LOW (ref 4.0–10.5)

## 2012-11-17 NOTE — Progress Notes (Signed)
Subjective: [name] is a 57 year old female with PMH of ESRD on HD TTS, Pancytopenia (baseline Hgb ~10), HIV (CD4 320, undect viral load on 09/13/12), and multiple hospitalizations for fever and bacteremia, found to be febrile to 104 at HD on 11/11/12.   Patient seen in at bedside this AM. No complaints today. Denies any abdominal pain, SOB, chest pain, cough, diarrhea, fever, chills.   Objective: Vital signs in last 24 hours: Filed Vitals:   11/16/12 1300 11/16/12 1746 11/16/12 2056 11/17/12 0630  BP: 90/66 100/75 102/52 100/54  Pulse: 76 81 82 71  Temp: 98.1 F (36.7 C) 97.1 F (36.2 C) 98.3 F (36.8 C) 98.1 F (36.7 C)  TempSrc: Oral Oral Oral Oral  Resp: 15 16 16 16   Height:      Weight: 205 lb 4 oz (93.1 kg)  205 lb 4 oz (93.101 kg)   SpO2: 100% 100% 95% 96%   Weight change: 8 lb 13.1 oz (4 kg)  Intake/Output Summary (Last 24 hours) at 11/17/12 0759 Last data filed at 11/17/12 0631  Gross per 24 hour  Intake    600 ml  Output    473 ml  Net    127 ml   Physical Exam: General: Alert, cooperative, and in no apparent distress HEENT: Vision grossly intact, oropharynx clear and non-erythematous  Neck: Full range of motion without pain, supple, no lymphadenopathy or carotid bruits Lungs: Clear to ascultation bilaterally, normal work of respiration, no wheezes, rales, ronchi Heart: Regular rate and rhythm, 2/6 systolic murmur, no gallops, or rubs Abdomen: Soft, non-tender, non-distended, normal bowel sounds Extremities: No cyanosis, clubbing, or edema. Multiple scars in UE's from previous grafts in the past. AVG in left thigh w/ thrill and bruit. Neurologic: Alert & oriented X3, cranial nerves II-XII intact, strength grossly intact, sensation intact to light touch  Lab Results: Basic Metabolic Panel:  Recent Labs Lab 11/13/12 0748  11/16/12 0622 11/16/12 0912  NA 130*  < > 129* 130*  K 4.6  < > 5.0 4.6  CL 91*  < > 91* 91*  CO2 24  < > 22 22  GLUCOSE 120*  <  > 80 154*  BUN 52*  < > 52* 53*  CREATININE 8.46*  < > 9.51* 9.77*  CALCIUM 9.5  < > 10.2 10.1  PHOS 5.3*  --   --  3.8  < > = values in this interval not displayed. Liver Function Tests:  Recent Labs Lab 11/15/12 0735 11/16/12 0622 11/16/12 0912  AST 88* 60*  --   ALT 86* 61*  --   ALKPHOS 209* 199*  --   BILITOT 0.5 0.5  --   PROT 8.9* 8.4*  --   ALBUMIN 3.3* 3.2* 3.0*   CBC:  Recent Labs Lab 11/14/12 0620  11/16/12 0622 11/16/12 0912  WBC 3.6*  < > 4.7 4.0  NEUTROABS 0.6*  --  3.3  --   HGB 11.8*  < > 11.3* 11.4*  HCT 34.6*  < > 33.1* 33.1*  MCV 109.5*  < > 106.8* 107.8*  PLT 147*  < > 172 168  < > = values in this interval not displayed. Coagulation:  Recent Labs Lab 11/12/12 0125  LABPROT 14.8  INR 1.19    Micro Results: Recent Results (from the past 240 hour(s))  CULTURE, BLOOD (ROUTINE X 2)     Status: None   Collection Time    11/11/12  6:40 PM  Result Value Range Status   Specimen Description BLOOD FOREARM LEFT   Final   Special Requests BOTTLES DRAWN AEROBIC ONLY 8CC   Final   Culture  Setup Time     Final   Value: 11/12/2012 03:05     Performed at [facility]   Culture     Final   Value:        BLOOD CULTURE RECEIVED NO GROWTH TO DATE CULTURE WILL BE HELD FOR 5 DAYS BEFORE ISSUING A FINAL NEGATIVE REPORT     Performed at [facility]   Report Status PENDING   Incomplete  CULTURE, BLOOD (ROUTINE X 2)     Status: None   Collection Time    11/11/12  6:53 PM      Result Value Range Status   Specimen Description BLOOD ARM LEFT   Final   Special Requests BOTTLES DRAWN AEROBIC AND ANAEROBIC 10CC   Final   Culture  Setup Time     Final   Value: 11/12/2012 03:04     Performed at [facility]   Culture     Final   Value:        BLOOD CULTURE RECEIVED NO GROWTH TO DATE CULTURE WILL BE HELD FOR 5 DAYS BEFORE ISSUING A FINAL NEGATIVE REPORT     Performed at [facility]   Report Status PENDING   Incomplete  MRSA  PCR SCREENING     Status: None   Collection Time    11/11/12 11:15 PM      Result Value Range Status   MRSA by PCR NEGATIVE  NEGATIVE Final   Comment:            The GeneXpert MRSA Assay (FDA     approved for NASAL specimens     only), is one component of a     comprehensive MRSA colonization     surveillance program. It is not     intended to diagnose MRSA     infection nor to guide or     monitor treatment for     MRSA infections.   Studies/Results: Ct Chest W Contrast  11/15/2012   CLINICAL DATA:  Fever. Unknown origin. HIV. Cardiomyopathy. Bacteremia. Heart failure.  EXAM: CT CHEST, ABDOMEN, AND PELVIS WITH CONTRAST  TECHNIQUE: Multidetector CT imaging of the chest, abdomen and pelvis was performed following the standard protocol during bolus administration of intravenous contrast.  CONTRAST:  12mL OMNIPAQUE IOHEXOL 300 MG/ML  SOLN  COMPARISON:  Plain film chest 11/11/2012. Chest CT 12/21/2011. Abdominal pelvic CT 07/11/2011.  FINDINGS: CT CHEST FINDINGS  Lungs/Pleura: Mild degradation by patient body habitus. Interval resolution of the previously described left upper lobe airspace disease since 12/21/2011.  No pleural fluid.  Heart/Mediastinum: No definite supraclavicular adenopathy. Nonspecific subcentimeter right-sided thyroid nodule.  Right axillary adenopathy is decreased. Example 1.1 cm on image 11 versus 1.2 cm on the prior. Similar improvement in left axillary and bilateral subpectoral adenopathy.  Tortuous thoracic aorta. Mild cardiomegaly with venous collaterals about the mediastinum. Likely due to venous insufficiency, possibly in the left brachiocephalic vein.  Pulmonary artery enlargement, with the outflow tract measuring 3.8 cm. No central pulmonary embolism, on this non-dedicated study.  Right paratracheal node measures 9 mm on image 16 versus 1.3 cm on the prior. No hilar adenopathy. Resolved azgo-esophageal recess adenopathy.  CT ABDOMEN AND PELVIS FINDINGS  Abdomen/Pelvis:  Normal liver, spleen, stomach, pancreas. Gallstones. Contracted gallbladder, without specific evidence of acute cholecystitis. No biliary ductal  dilatation.  Normal adrenal glands. Markedly atrophic bilateral kidneys. Low-density left renal lesions which are likely cysts. Small retroperitoneal nodes which are decreased in size since the prior exam and not pathologic by size criteria.  Normal colon and terminal ileum. Normal small bowel without abdominal ascites.  Surgical changes in the left inguinal region. Mildly prominent inguinal nodes are decreased since the prior exam. Decompressed urinary bladder. Soft tissue fullness within a retroverted uterus, suspicious for underlying fibroids. No adnexal mass or significant free fluid.  Bones/Musculoskeletal: Probable renal osteodystrophy with increased density of the marrow space. Loss of intervertebral disc height at the lumbosacral junction is chronic and likely related to degenerative disc disease. Concurrent disc bulge at this level.  IMPRESSION: CT CHEST IMPRESSION  1. Since 12/21/2011, interval improvement in thoracic adenopathy. Favored to be reactive, and possibly related to the clinical history of HIV. 2. Otherwise, no explanation for fever within the chest. Resolution of left upper lobe pneumonia. 3. Pulmonary artery enlargement suggests pulmonary arterial hypertension. 4. Centrally venous insufficiency with venous collaterals throughout the mediastinum. Favor related to left brachiocephalic vein insufficiency.  CT ABDOMEN AND PELVIS IMPRESSION  1. No explanation for fever within the abdomen or pelvis. 2. Gallstones with a contracted gallbladder. Cannot exclude a component of chronic cholecystitis. No evidence of acute inflammation. 3. Soft tissue fullness within the uterus. Question underlying fibroids. 4. Prominent inguinal nodes which are improved since the prior exam and likely reactive.   Electronically Signed   By: Abigail Miyamoto M.D.   On: 11/15/2012  18:41   Ct Abdomen Pelvis W Contrast  11/15/2012   CLINICAL DATA:  Fever. Unknown origin. HIV. Cardiomyopathy. Bacteremia. Heart failure.  EXAM: CT CHEST, ABDOMEN, AND PELVIS WITH CONTRAST  TECHNIQUE: Multidetector CT imaging of the chest, abdomen and pelvis was performed following the standard protocol during bolus administration of intravenous contrast.  CONTRAST:  131mL OMNIPAQUE IOHEXOL 300 MG/ML  SOLN  COMPARISON:  Plain film chest 11/11/2012. Chest CT 12/21/2011. Abdominal pelvic CT 07/11/2011.  FINDINGS: CT CHEST FINDINGS  Lungs/Pleura: Mild degradation by patient body habitus. Interval resolution of the previously described left upper lobe airspace disease since 12/21/2011.  No pleural fluid.  Heart/Mediastinum: No definite supraclavicular adenopathy. Nonspecific subcentimeter right-sided thyroid nodule.  Right axillary adenopathy is decreased. Example 1.1 cm on image 11 versus 1.2 cm on the prior. Similar improvement in left axillary and bilateral subpectoral adenopathy.  Tortuous thoracic aorta. Mild cardiomegaly with venous collaterals about the mediastinum. Likely due to venous insufficiency, possibly in the left brachiocephalic vein.  Pulmonary artery enlargement, with the outflow tract measuring 3.8 cm. No central pulmonary embolism, on this non-dedicated study.  Right paratracheal node measures 9 mm on image 16 versus 1.3 cm on the prior. No hilar adenopathy. Resolved azgo-esophageal recess adenopathy.  CT ABDOMEN AND PELVIS FINDINGS  Abdomen/Pelvis: Normal liver, spleen, stomach, pancreas. Gallstones. Contracted gallbladder, without specific evidence of acute cholecystitis. No biliary ductal dilatation.  Normal adrenal glands. Markedly atrophic bilateral kidneys. Low-density left renal lesions which are likely cysts. Small retroperitoneal nodes which are decreased in size since the prior exam and not pathologic by size criteria.  Normal colon and terminal ileum. Normal small bowel without  abdominal ascites.  Surgical changes in the left inguinal region. Mildly prominent inguinal nodes are decreased since the prior exam. Decompressed urinary bladder. Soft tissue fullness within a retroverted uterus, suspicious for underlying fibroids. No adnexal mass or significant free fluid.  Bones/Musculoskeletal: Probable renal osteodystrophy with increased density of the marrow space.  Loss of intervertebral disc height at the lumbosacral junction is chronic and likely related to degenerative disc disease. Concurrent disc bulge at this level.  IMPRESSION: CT CHEST IMPRESSION  1. Since 12/21/2011, interval improvement in thoracic adenopathy. Favored to be reactive, and possibly related to the clinical history of HIV. 2. Otherwise, no explanation for fever within the chest. Resolution of left upper lobe pneumonia. 3. Pulmonary artery enlargement suggests pulmonary arterial hypertension. 4. Centrally venous insufficiency with venous collaterals throughout the mediastinum. Favor related to left brachiocephalic vein insufficiency.  CT ABDOMEN AND PELVIS IMPRESSION  1. No explanation for fever within the abdomen or pelvis. 2. Gallstones with a contracted gallbladder. Cannot exclude a component of chronic cholecystitis. No evidence of acute inflammation. 3. Soft tissue fullness within the uterus. Question underlying fibroids. 4. Prominent inguinal nodes which are improved since the prior exam and likely reactive.   Electronically Signed   By: Abigail Miyamoto M.D.   On: 11/15/2012 18:41   Medications: I have reviewed the patient's current medications. Scheduled Meds: . abacavir  600 mg Oral Q1200  . aspirin  81 mg Oral Q1200  . aztreonam  1 g Intravenous Q24H  . calcium acetate  1,334 mg Oral TID WC  . Darunavir Ethanolate  800 mg Oral Q breakfast  . doxercalciferol  2 mcg Intravenous Q T,Th,Sa-HD  . heparin  5,000 Units Subcutaneous Q8H  . lamiVUDine  50 mg Oral Q1200  . midodrine  10 mg Oral BID  .  multivitamin  1 tablet Oral QHS  . ritonavir  100 mg Oral Q breakfast  . sodium chloride  3 mL Intravenous Q12H  . vancomycin  1,000 mg Intravenous Q T,Th,Sa-HD   Continuous Infusions:  PRN Meds:.oxyCODONE  Assessment/Plan: FLORIA ROWLEE is a 57 year old female with PMH of ESRD on HD TTS, Pancytopenia (baseline Hgb ~10), HIV (CD4 320, undect viral load on 09/13/12), and multiple hospitalizations for fever and bacteremia, found to be febrile to 104 at HD on 11/11/12.   Patient with extensive ID history in the past as follows: 02/11/12 --> MRSA Bacteremia with HCAP, blood cx 2/6, leukocytosis (10.9), Tm 104 (TEE - no vegetations, vascular revised graft - not infected) --> treated with Vanc, Cefepime, Zyvox, Ancef --> Vanc x 6 weeks 12/19/11 --> HCAP with possible bacteremia, blood cx 1/4 gram + cocci in pairs & chains (ID thought contaminant), normal wbc, Tm 105.8 --> treated with levaquin, aztreonam, daptomycin (given VRE hx), Vanc  08/21/11 --> VRE bacteremia & C.diff, 4/4 blood cx & right chest HD catheter tip + enterococcus 80K colonies, WBC 2.8, Abs Neut 1.6, Tm 103, TEE with small calcified mobile mass on chordae tendinae --> treated with Rocephin, vanc, zyvoxx, Daptomycin --> Dapto x 2 weeks  07/11/11 --> C diff with Tm 103.6, WBC 1.6  05/18/11 --> Bacteremia (likely d/t HD catheter) with bacterial endocarditis & HCAP --> Vanc/Zosyn then Ancef x 6 weeks  12/23/10 --> MSSA bacteremia with RLL PNA --> Ancef x 6 weeks  Fever- Patient came from HD center with fever of 104. NO cultures drawn at dialysis center. Met SIRS criteria w/ fever and WBC's of 2.9. At this time there continues to be no obvious source of infection. CXR portable 1 view on admission showed no sign of respiratory source and patient denies cough, patient does not make urine, no signs of obvious infection at fistula site, no HA, AMS, or stiff neck, no diarrhea. Lactic acid was wnl on admission. Blood cultures drawn, still  with no growth  (01/18/12). Mild fever on 11/16/12, 100.9 Tmax. Blood cultures repeated. Given h/o endocarditis in the past and recurrence of fever on Abx, consider TEE if further fever as patient is high risk for endocarditis. However, only fulfills 2 minor [facility] criteria at this time. -ID consulted, appreciate recommendations. CT chest/abd/pelvis shows no explanation for fever at this time (see results above), except for possible chronic cholecystitis. If fever continues, will consider TEE + add oral Flagyl.   Hypotension- Presented to ED w/ BP of 65/25. Improved after patient was bolused w/ 500 cc. Has had multiple UE vascular surgeries. -Continue Midodrine -Continue to monitor pressures on thigh if necessary, will bolus if hypotensive.   Transaminitis- AST 25 in May 2014. AST and ALT elevated to ~5 times upper limit on admission. Patient has no history of Hep B or C. She does have a history of gallstones but does not currently have any abdominal pain. CT abdomen showed possible chronic cholecystitis, may explain transaminitis.  -Repeat LFT's this AM continue to trend down (11/17/12). Will continue to follow.   History of Endocarditis- Patient shown to have nodule on cordae in 08/2011 on TEE during admission for VRE bacteremia. Treated with Rocephin, vanc, zyvoxx, Daptomycin (dapto continued for 2 weeks). -Consider TEE if fever continues. - Patient does have a systolic murmur that was present on previous admissions (at least as early as Dec 2012), no noted MR, AS, or PS on TEE in February 2014.  HIV -CD4 320 in September with undect viral load  -Continue home HIV medications   Pancytopenia (chronic) -Chronic, patient did have a bone marrow Bx in 2012 that was hypercellular thought likely due to uncontrolled HIV. However last CBC in September had WBC of 4.1, mild anemia, and platelets of 170.   ESRD (end stage renal disease) on HD TTS- Nephrology following. HD yesterday.  Dispo: Disposition is deferred at this  time, awaiting improvement of current medical problems.  Anticipated discharge in approximately 1-2 day(s).   The patient does have a current PCP Ivin Poot, MD) and does need an [facility] hospital follow-up appointment after discharge.  The patient does not have transportation limitations that hinder transportation to clinic appointments.  .Services Needed at time of discharge: Y = Yes, Blank = No PT:   OT:   RN:   Equipment:   Other:     LOS: 6 days   Corky Sox, MD 11/17/2012, 7:59 AM Pager: [phone]

## 2012-11-17 NOTE — Progress Notes (Signed)
ANTIBIOTIC CONSULT NOTE - FOLLOW UP  Pharmacy Consult:  Vancomycin / Azactam Indication:  Empiric therapy in setting of immunosuppression with fever  Allergies  Allergen Reactions  . Penicillins Nausea And Vomiting and Rash    Patient Measurements: Height: 5\' 8"  (172.7 cm) Weight: 205 lb 4 oz (93.101 kg) IBW/kg (Calculated) : 63.9  Vital Signs: Temp: 98.1 F (36.7 C) (11/12 0815) Temp src: Oral (11/12 0815) BP: 98/58 mmHg (11/12 0815) Pulse Rate: 81 (11/12 0815) Intake/Output from previous day: 11/11 0701 - 11/12 0700 In: 600 [P.O.:600] Out: 473 [Stool:3] Intake/Output from this shift: Total I/O In: 240 [P.O.:240] Out: 0   Labs:  Recent Labs  11/15/12 0735 11/16/12 0622 11/16/12 0912 11/17/12 0852  WBC 3.2* 4.7 4.0 2.9*  HGB 12.1 11.3* 11.4* 11.0*  PLT 153 172 168 146*  CREATININE 7.48* 9.51* 9.77*  --    Estimated Creatinine Clearance: 7.7 ml/min (by C-G formula based on Cr of 9.77). No results found for this basename: VANCOTROUGH, VANCOPEAK, VANCORANDOM, [place], GENTPEAK, GENTRANDOM, TOBRATROUGH, TOBRAPEAK, TOBRARND, AMIKACINPEAK, AMIKACINTROU, AMIKACIN,  in the last 72 hours   Microbiology: Recent Results (from the past 720 hour(s))  CULTURE, BLOOD (ROUTINE X 2)     Status: None   Collection Time    11/11/12  6:40 PM      Result Value Range Status   Specimen Description BLOOD FOREARM LEFT   Final   Special Requests BOTTLES DRAWN AEROBIC ONLY 8CC   Final   Culture  Setup Time     Final   Value: 11/12/2012 03:05     Performed at [facility]   Culture     Final   Value:        BLOOD CULTURE RECEIVED NO GROWTH TO DATE CULTURE WILL BE HELD FOR 5 DAYS BEFORE ISSUING A FINAL NEGATIVE REPORT     Performed at [facility]   Report Status PENDING   Incomplete  CULTURE, BLOOD (ROUTINE X 2)     Status: None   Collection Time    11/11/12  6:53 PM      Result Value Range Status   Specimen Description BLOOD ARM LEFT   Final   Special Requests  BOTTLES DRAWN AEROBIC AND ANAEROBIC 10CC   Final   Culture  Setup Time     Final   Value: 11/12/2012 03:04     Performed at [facility]   Culture     Final   Value:        BLOOD CULTURE RECEIVED NO GROWTH TO DATE CULTURE WILL BE HELD FOR 5 DAYS BEFORE ISSUING A FINAL NEGATIVE REPORT     Performed at [facility]   Report Status PENDING   Incomplete  MRSA PCR SCREENING     Status: None   Collection Time    11/11/12 11:15 PM      Result Value Range Status   MRSA by PCR NEGATIVE  NEGATIVE Final   Comment:            The GeneXpert MRSA Assay (FDA     approved for NASAL specimens     only), is one component of a     comprehensive MRSA colonization     surveillance program. It is not     intended to diagnose MRSA     infection nor to guide or     monitor treatment for     MRSA infections.  CULTURE, BLOOD (ROUTINE X 2)     Status:  None   Collection Time    11/16/12  6:13 AM      Result Value Range Status   Specimen Description BLOOD LEFT HAND   Final   Special Requests BOTTLES DRAWN AEROBIC ONLY 8CC   Final   Culture  Setup Time     Final   Value: 11/16/2012 12:38     Performed at [facility]   Culture     Final   Value:        BLOOD CULTURE RECEIVED NO GROWTH TO DATE CULTURE WILL BE HELD FOR 5 DAYS BEFORE ISSUING A FINAL NEGATIVE REPORT     Performed at [facility]   Report Status PENDING   Incomplete  CULTURE, BLOOD (ROUTINE X 2)     Status: None   Collection Time    11/16/12  6:21 AM      Result Value Range Status   Specimen Description BLOOD LEFT FOREARM   Final   Special Requests BOTTLES DRAWN AEROBIC ONLY 3CC   Final   Culture  Setup Time     Final   Value: 11/16/2012 12:38     Performed at [facility]   Culture     Final   Value:        BLOOD CULTURE RECEIVED NO GROWTH TO DATE CULTURE WILL BE HELD FOR 5 DAYS BEFORE ISSUING A FINAL NEGATIVE REPORT     Performed at [facility]   Report Status PENDING   Incomplete       Assessment: 42 YOF continues on vancomycin and azactam for neutropenic fever.  She continues to tolerate hemodialysis.  Azactam 11/6 >> Vanc 11/6 >> Levaquin 11/6 >> 11/6  11/6 MRSA PCR - negative 11/6 Blood - NGTD 11/11 bcx x2 - NGTD   HIV:  CD4 320 ---- all doses appropriate for ESRD abacavir 600mg  daily darunavir 800mg  daily with breakfast ritonavir 100mg  daily with breakfast Lamivudine 50mg  daily   Goal of Therapy:  Vanc pre-HD level:  15-25 mcg/mL   Plan:  - Vanc 1gm IV q-HD TTS - Azactam 1gm IV Q24H - Monitor clinical progression, vanc pre-HD level soon    Basil Buffin D. Mina Marble, PharmD, BCPS Pager:  [phone] 11/17/2012, 10:57 AM

## 2012-11-17 NOTE — Progress Notes (Signed)
[facility]     Day # 7vancomycin  Day # 7  aztreonam  Subjective: No new complaints  Antibiotics:  Anti-infectives   Start     Dose/Rate Route Frequency Ordered Stop   11/13/12 2000  levofloxacin (LEVAQUIN) IVPB 500 mg  Status:  Discontinued     500 mg 100 mL/hr over 60 Minutes Intravenous Every 48 hours 11/11/12 2011 11/11/12 2320   11/13/12 1200  vancomycin (VANCOCIN) IVPB 1000 mg/200 mL premix     1,000 mg 200 mL/hr over 60 Minutes Intravenous Every T-Th-Sa (Hemodialysis) 11/12/12 1434     11/12/12 2100  aztreonam (AZACTAM) 1 g in dextrose 5 % 50 mL IVPB     1 g 100 mL/hr over 30 Minutes Intravenous Every 24 hours 11/11/12 2011     11/12/12 1445  vancomycin (VANCOCIN) IVPB 1000 mg/200 mL premix     1,000 mg 200 mL/hr over 60 Minutes Intravenous  Once 11/12/12 1434 11/12/12 1606   11/12/12 1200  lamiVUDine (EPIVIR) 10 MG/ML solution 50 mg     50 mg Oral Daily 11/11/12 2322     11/12/12 1200  abacavir (ZIAGEN) tablet 600 mg     600 mg Oral Daily 11/11/12 2322     11/12/12 0700  Darunavir Ethanolate (PREZISTA) tablet 800 mg     800 mg Oral Daily with breakfast 11/11/12 2322     11/12/12 0700  ritonavir (NORVIR) tablet 100 mg     100 mg Oral Daily with breakfast 11/11/12 2322     11/11/12 2030  vancomycin (VANCOCIN) IVPB 750 mg/150 ml premix     750 mg 150 mL/hr over 60 Minutes Intravenous  Once 11/11/12 2029 11/12/12 0053   11/11/12 1945  levofloxacin (LEVAQUIN) IVPB 750 mg     750 mg 100 mL/hr over 90 Minutes Intravenous  Once 11/11/12 1940 11/11/12 2200   11/11/12 1945  aztreonam (AZACTAM) 2 g in dextrose 5 % 50 mL IVPB     2 g 100 mL/hr over 30 Minutes Intravenous  Once 11/11/12 1940 11/11/12 2250   11/11/12 1945  vancomycin (VANCOCIN) IVPB 1000 mg/200 mL premix  Status:  Discontinued     1,000 mg 200 mL/hr over 60 Minutes Intravenous  Once 11/11/12 1940 11/12/12 1433      Medications: Scheduled Meds: . abacavir  600 mg Oral  Q1200  . aspirin  81 mg Oral Q1200  . aztreonam  1 g Intravenous Q24H  . calcium acetate  1,334 mg Oral TID WC  . Darunavir Ethanolate  800 mg Oral Q breakfast  . doxercalciferol  2 mcg Intravenous Q T,Th,Sa-HD  . heparin  5,000 Units Subcutaneous Q8H  . lamiVUDine  50 mg Oral Q1200  . midodrine  10 mg Oral BID  . multivitamin  1 tablet Oral QHS  . ritonavir  100 mg Oral Q breakfast  . sodium chloride  3 mL Intravenous Q12H  . vancomycin  1,000 mg Intravenous Q T,Th,Sa-HD   Continuous Infusions:  PRN Meds:.oxyCODONE   Objective: Weight change: 8 lb 13.1 oz (4 kg)  Intake/Output Summary (Last 24 hours) at 11/17/12 1731 Last data filed at 11/17/12 0840  Gross per 24 hour  Intake    360 ml  Output      0 ml  Net    360 ml   Blood pressure 100/64, pulse 87, temperature 98.1 F (36.7 C), temperature source Oral, resp. rate 16, height 5\' 8"  (1.727 m), weight 205 lb 4  oz (93.101 kg), SpO2 98.00%. Temp:  [97.1 F (36.2 C)-98.3 F (36.8 C)] 98.1 F (36.7 C) (11/12 1400) Pulse Rate:  [71-87] 87 (11/12 1400) Resp:  [16] 16 (11/12 1400) BP: (98-102)/(52-75) 100/64 mmHg (11/12 1400) SpO2:  [95 %-100 %] 98 % (11/12 1400) Weight:  [205 lb 4 oz (93.101 kg)] 205 lb 4 oz (93.101 kg) (11/11 2056)  Physical Exam: General: Alert and awake, oriented x3,  HEENT: anicteric sclera, pupils reactive to light and accommodation, EOMI CVS regular rate, II/VI systolic murmur Chest: clear to auscultation bilaterally, no wheezing, rales or rhonchi Abdomen: soft nontender, nondistended, normal bowel sounds, Extremities HD graft site clean Skin: no rashes Neuro: nonfocal  Lab Results:  Recent Labs  11/16/12 0912 11/17/12 0852  WBC 4.0 2.9*  HGB 11.4* 11.0*  HCT 33.1* 33.5*  PLT 168 146*    BMET  Recent Labs  11/16/12 0912 11/17/12 0852  NA 130* 137  K 4.6 4.1  CL 91* 96  CO2 22 30  GLUCOSE 154* 104*  BUN 53* 19  CREATININE 9.77* 5.71*  CALCIUM 10.1 10.1    Micro  Results: Recent Results (from the past 240 hour(s))  CULTURE, BLOOD (ROUTINE X 2)     Status: None   Collection Time    11/11/12  6:40 PM      Result Value Range Status   Specimen Description BLOOD FOREARM LEFT   Final   Special Requests BOTTLES DRAWN AEROBIC ONLY 8CC   Final   Culture  Setup Time     Final   Value: 11/12/2012 03:05     Performed at [facility]   Culture     Final   Value:        BLOOD CULTURE RECEIVED NO GROWTH TO DATE CULTURE WILL BE HELD FOR 5 DAYS BEFORE ISSUING A FINAL NEGATIVE REPORT     Performed at [facility]   Report Status PENDING   Incomplete  CULTURE, BLOOD (ROUTINE X 2)     Status: None   Collection Time    11/11/12  6:53 PM      Result Value Range Status   Specimen Description BLOOD ARM LEFT   Final   Special Requests BOTTLES DRAWN AEROBIC AND ANAEROBIC 10CC   Final   Culture  Setup Time     Final   Value: 11/12/2012 03:04     Performed at [facility]   Culture     Final   Value:        BLOOD CULTURE RECEIVED NO GROWTH TO DATE CULTURE WILL BE HELD FOR 5 DAYS BEFORE ISSUING A FINAL NEGATIVE REPORT     Performed at [facility]   Report Status PENDING   Incomplete  MRSA PCR SCREENING     Status: None   Collection Time    11/11/12 11:15 PM      Result Value Range Status   MRSA by PCR NEGATIVE  NEGATIVE Final   Comment:            The GeneXpert MRSA Assay (FDA     approved for NASAL specimens     only), is one component of a     comprehensive MRSA colonization     surveillance program. It is not     intended to diagnose MRSA     infection nor to guide or     monitor treatment for     MRSA infections.  CULTURE, BLOOD (ROUTINE X 2)  Status: None   Collection Time    11/16/12  6:13 AM      Result Value Range Status   Specimen Description BLOOD LEFT HAND   Final   Special Requests BOTTLES DRAWN AEROBIC ONLY 8CC   Final   Culture  Setup Time     Final   Value: 11/16/2012 12:38     Performed at [facility]   Culture     Final   Value:        BLOOD CULTURE RECEIVED NO GROWTH TO DATE CULTURE WILL BE HELD FOR 5 DAYS BEFORE ISSUING A FINAL NEGATIVE REPORT     Performed at [facility]   Report Status PENDING   Incomplete  CULTURE, BLOOD (ROUTINE X 2)     Status: None   Collection Time    11/16/12  6:21 AM      Result Value Range Status   Specimen Description BLOOD LEFT FOREARM   Final   Special Requests BOTTLES DRAWN AEROBIC ONLY 3CC   Final   Culture  Setup Time     Final   Value: 11/16/2012 12:38     Performed at [facility]   Culture     Final   Value:        BLOOD CULTURE RECEIVED NO GROWTH TO DATE CULTURE WILL BE HELD FOR 5 DAYS BEFORE ISSUING A FINAL NEGATIVE REPORT     Performed at [facility]   Report Status PENDING   Incomplete    Studies/Results: Ct Chest W Contrast  11/15/2012   CLINICAL DATA:  Fever. Unknown origin. HIV. Cardiomyopathy. Bacteremia. Heart failure.  EXAM: CT CHEST, ABDOMEN, AND PELVIS WITH CONTRAST  TECHNIQUE: Multidetector CT imaging of the chest, abdomen and pelvis was performed following the standard protocol during bolus administration of intravenous contrast.  CONTRAST:  117mL OMNIPAQUE IOHEXOL 300 MG/ML  SOLN  COMPARISON:  Plain film chest 11/11/2012. Chest CT 12/21/2011. Abdominal pelvic CT 07/11/2011.  FINDINGS: CT CHEST FINDINGS  Lungs/Pleura: Mild degradation by patient body habitus. Interval resolution of the previously described left upper lobe airspace disease since 12/21/2011.  No pleural fluid.  Heart/Mediastinum: No definite supraclavicular adenopathy. Nonspecific subcentimeter right-sided thyroid nodule.  Right axillary adenopathy is decreased. Example 1.1 cm on image 11 versus 1.2 cm on the prior. Similar improvement in left axillary and bilateral subpectoral adenopathy.  Tortuous thoracic aorta. Mild cardiomegaly with venous collaterals about the mediastinum. Likely due to venous insufficiency, possibly in the left  brachiocephalic vein.  Pulmonary artery enlargement, with the outflow tract measuring 3.8 cm. No central pulmonary embolism, on this non-dedicated study.  Right paratracheal node measures 9 mm on image 16 versus 1.3 cm on the prior. No hilar adenopathy. Resolved azgo-esophageal recess adenopathy.  CT ABDOMEN AND PELVIS FINDINGS  Abdomen/Pelvis: Normal liver, spleen, stomach, pancreas. Gallstones. Contracted gallbladder, without specific evidence of acute cholecystitis. No biliary ductal dilatation.  Normal adrenal glands. Markedly atrophic bilateral kidneys. Low-density left renal lesions which are likely cysts. Small retroperitoneal nodes which are decreased in size since the prior exam and not pathologic by size criteria.  Normal colon and terminal ileum. Normal small bowel without abdominal ascites.  Surgical changes in the left inguinal region. Mildly prominent inguinal nodes are decreased since the prior exam. Decompressed urinary bladder. Soft tissue fullness within a retroverted uterus, suspicious for underlying fibroids. No adnexal mass or significant free fluid.  Bones/Musculoskeletal: Probable renal osteodystrophy with increased density of the marrow space. Loss of intervertebral disc height  at the lumbosacral junction is chronic and likely related to degenerative disc disease. Concurrent disc bulge at this level.  IMPRESSION: CT CHEST IMPRESSION  1. Since 12/21/2011, interval improvement in thoracic adenopathy. Favored to be reactive, and possibly related to the clinical history of HIV. 2. Otherwise, no explanation for fever within the chest. Resolution of left upper lobe pneumonia. 3. Pulmonary artery enlargement suggests pulmonary arterial hypertension. 4. Centrally venous insufficiency with venous collaterals throughout the mediastinum. Favor related to left brachiocephalic vein insufficiency.  CT ABDOMEN AND PELVIS IMPRESSION  1. No explanation for fever within the abdomen or pelvis. 2. Gallstones  with a contracted gallbladder. Cannot exclude a component of chronic cholecystitis. No evidence of acute inflammation. 3. Soft tissue fullness within the uterus. Question underlying fibroids. 4. Prominent inguinal nodes which are improved since the prior exam and likely reactive.   Electronically Signed   By: Abigail Miyamoto M.D.   On: 11/15/2012 18:41   Ct Abdomen Pelvis W Contrast  11/15/2012   CLINICAL DATA:  Fever. Unknown origin. HIV. Cardiomyopathy. Bacteremia. Heart failure.  EXAM: CT CHEST, ABDOMEN, AND PELVIS WITH CONTRAST  TECHNIQUE: Multidetector CT imaging of the chest, abdomen and pelvis was performed following the standard protocol during bolus administration of intravenous contrast.  CONTRAST:  1104mL OMNIPAQUE IOHEXOL 300 MG/ML  SOLN  COMPARISON:  Plain film chest 11/11/2012. Chest CT 12/21/2011. Abdominal pelvic CT 07/11/2011.  FINDINGS: CT CHEST FINDINGS  Lungs/Pleura: Mild degradation by patient body habitus. Interval resolution of the previously described left upper lobe airspace disease since 12/21/2011.  No pleural fluid.  Heart/Mediastinum: No definite supraclavicular adenopathy. Nonspecific subcentimeter right-sided thyroid nodule.  Right axillary adenopathy is decreased. Example 1.1 cm on image 11 versus 1.2 cm on the prior. Similar improvement in left axillary and bilateral subpectoral adenopathy.  Tortuous thoracic aorta. Mild cardiomegaly with venous collaterals about the mediastinum. Likely due to venous insufficiency, possibly in the left brachiocephalic vein.  Pulmonary artery enlargement, with the outflow tract measuring 3.8 cm. No central pulmonary embolism, on this non-dedicated study.  Right paratracheal node measures 9 mm on image 16 versus 1.3 cm on the prior. No hilar adenopathy. Resolved azgo-esophageal recess adenopathy.  CT ABDOMEN AND PELVIS FINDINGS  Abdomen/Pelvis: Normal liver, spleen, stomach, pancreas. Gallstones. Contracted gallbladder, without specific evidence of  acute cholecystitis. No biliary ductal dilatation.  Normal adrenal glands. Markedly atrophic bilateral kidneys. Low-density left renal lesions which are likely cysts. Small retroperitoneal nodes which are decreased in size since the prior exam and not pathologic by size criteria.  Normal colon and terminal ileum. Normal small bowel without abdominal ascites.  Surgical changes in the left inguinal region. Mildly prominent inguinal nodes are decreased since the prior exam. Decompressed urinary bladder. Soft tissue fullness within a retroverted uterus, suspicious for underlying fibroids. No adnexal mass or significant free fluid.  Bones/Musculoskeletal: Probable renal osteodystrophy with increased density of the marrow space. Loss of intervertebral disc height at the lumbosacral junction is chronic and likely related to degenerative disc disease. Concurrent disc bulge at this level.  IMPRESSION: CT CHEST IMPRESSION  1. Since 12/21/2011, interval improvement in thoracic adenopathy. Favored to be reactive, and possibly related to the clinical history of HIV. 2. Otherwise, no explanation for fever within the chest. Resolution of left upper lobe pneumonia. 3. Pulmonary artery enlargement suggests pulmonary arterial hypertension. 4. Centrally venous insufficiency with venous collaterals throughout the mediastinum. Favor related to left brachiocephalic vein insufficiency.  CT ABDOMEN AND PELVIS IMPRESSION  1. No explanation for fever  within the abdomen or pelvis. 2. Gallstones with a contracted gallbladder. Cannot exclude a component of chronic cholecystitis. No evidence of acute inflammation. 3. Soft tissue fullness within the uterus. Question underlying fibroids. 4. Prominent inguinal nodes which are improved since the prior exam and likely reactive.   Electronically Signed   By: Abigail Miyamoto M.D.   On: 11/15/2012 18:41      Assessment/Plan: [name] is a 57 y.o. 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 CT read ? Chronic cholecystitis and with LFT up at admission this could indeed have been a case of cholecystitis. She is pretty broadly covered though not for Anerobes or VRE  Given that fevers seem to have abated again and she has received 7 days of VERY broad spectrum abx  I would recommend DC her abx and IF she remains afebrile can DC her to home in the am   if fevers further would get blood cultures, TEE and add oral flagyl   #2 HIV: continue her current regimen   LOS: 6 days   Alcide Evener 11/17/2012, 5:31 PM

## 2012-11-17 NOTE — Progress Notes (Signed)
I saw the patient and agree with the above assessment and plan.

## 2012-11-17 NOTE — Progress Notes (Signed)
  Date: 11/17/2012  Patient name: [name]  Medical record number: [id number]  Date of birth: [birth year]   This patient has been seen and the plan of care was discussed with the house staff. Please see their note for complete details. I concur with their findings with the following additions/corrections: Afebrile in past 24 hours. No complaints for me today. No CP or SOB. No pain over R fem HD access site. -Fever: For now, I would continue Vanc and aztreonam at least one more day. Given that [facility] have shown no growth to date and she has no obvious evidence of infection, this is promising.  Would touch base with ID for final recs. Re-culture if she spikes another fever and then will need to consider addition of flagyl and possibly TEE. If continues to do well, expect D/C in 1-2 days.   Dominic Pea, DO, [facility] 11/17/2012, 2:16 PM

## 2012-11-17 NOTE — Progress Notes (Signed)
Subjective:  No cos , tolerated hd  Yesterday, tells me dc  today Objective Vital signs in last 24 hours: Filed Vitals:   11/16/12 1746 11/16/12 2056 11/17/12 0630 11/17/12 0815  BP: 100/75 102/52 100/54 98/58  Pulse: 81 82 71 81  Temp: 97.1 F (36.2 C) 98.3 F (36.8 C) 98.1 F (36.7 C) 98.1 F (36.7 C)  TempSrc: Oral Oral Oral Oral  Resp: 16 16 16 16   Height:      Weight:  93.101 kg (205 lb 4 oz)    SpO2: 100% 95% 96% 97%   Weight change: 4 kg (8 lb 13.1 oz)  Intake/Output Summary (Last 24 hours) at 11/17/12 1042 Last data filed at 11/17/12 0840  Gross per 24 hour  Intake    840 ml  Output    473 ml  Net    367 ml   Labs: Basic Metabolic Panel:  Recent Labs Lab 11/13/12 0748  11/15/12 0735 11/16/12 0622 11/16/12 0912  NA 130*  < > 132* 129* 130*  K 4.6  < > 4.2 5.0 4.6  CL 91*  < > 93* 91* 91*  CO2 24  < > 26 22 22   GLUCOSE 120*  < > 81 80 154*  BUN 52*  < > 41* 52* 53*  CREATININE 8.46*  < > 7.48* 9.51* 9.77*  CALCIUM 9.5  < > 10.3 10.2 10.1  PHOS 5.3*  --   --   --  3.8  < > = values in this interval not displayed. Liver Function Tests:  Recent Labs Lab 11/14/12 0620 11/15/12 0735 11/16/12 0622 11/16/12 0912  AST 129* 88* 60*  --   ALT 111* 86* 61*  --   ALKPHOS 216* 209* 199*  --   BILITOT 0.6 0.5 0.5  --   PROT 8.9* 8.9* 8.4*  --   ALBUMIN 3.3* 3.3* 3.2* 3.0*    CBC:  Recent Labs Lab 11/14/12 0620 11/15/12 0735 11/16/12 0622 11/16/12 0912 11/17/12 0852  WBC 3.6* 3.2* 4.7 4.0 2.9*  NEUTROABS 0.6*  --  3.3  --  1.3*  HGB 11.8* 12.1 11.3* 11.4* 11.0*  HCT 34.6* 35.4* 33.1* 33.1* 33.5*  MCV 109.5* 108.6* 106.8* 107.8* 109.8*  PLT 147* 153 172 168 146*   Ct Chest W Contrast  11/15/2012   CLINICAL DATA:  Fever. Unknown origin. HIV. Cardiomyopathy. Bacteremia. Heart failure.  EXAM: CT CHEST, ABDOMEN, AND PELVIS WITH CONTRAST  TECHNIQUE: Multidetector CT imaging of the chest, abdomen and pelvis was performed following the standard protocol  during bolus administration of intravenous contrast.  CONTRAST:  164mL OMNIPAQUE IOHEXOL 300 MG/ML  SOLN  COMPARISON:  Plain film chest 11/11/2012. Chest CT 12/21/2011. Abdominal pelvic CT 07/11/2011.  FINDINGS: CT CHEST FINDINGS  Lungs/Pleura: Mild degradation by patient body habitus. Interval resolution of the previously described left upper lobe airspace disease since 12/21/2011.  No pleural fluid.  Heart/Mediastinum: No definite supraclavicular adenopathy. Nonspecific subcentimeter right-sided thyroid nodule.  Right axillary adenopathy is decreased. Example 1.1 cm on image 11 versus 1.2 cm on the prior. Similar improvement in left axillary and bilateral subpectoral adenopathy.  Tortuous thoracic aorta. Mild cardiomegaly with venous collaterals about the mediastinum. Likely due to venous insufficiency, possibly in the left brachiocephalic vein.  Pulmonary artery enlargement, with the outflow tract measuring 3.8 cm. No central pulmonary embolism, on this non-dedicated study.  Right paratracheal node measures 9 mm on image 16 versus 1.3 cm on the prior. No hilar adenopathy. Resolved azgo-esophageal recess adenopathy.  CT ABDOMEN AND PELVIS FINDINGS  Abdomen/Pelvis: Normal liver, spleen, stomach, pancreas. Gallstones. Contracted gallbladder, without specific evidence of acute cholecystitis. No biliary ductal dilatation.  Normal adrenal glands. Markedly atrophic bilateral kidneys. Low-density left renal lesions which are likely cysts. Small retroperitoneal nodes which are decreased in size since the prior exam and not pathologic by size criteria.  Normal colon and terminal ileum. Normal small bowel without abdominal ascites.  Surgical changes in the left inguinal region. Mildly prominent inguinal nodes are decreased since the prior exam. Decompressed urinary bladder. Soft tissue fullness within a retroverted uterus, suspicious for underlying fibroids. No adnexal mass or significant free fluid.  Bones/Musculoskeletal:  Probable renal osteodystrophy with increased density of the marrow space. Loss of intervertebral disc height at the lumbosacral junction is chronic and likely related to degenerative disc disease. Concurrent disc bulge at this level.  IMPRESSION: CT CHEST IMPRESSION  1. Since 12/21/2011, interval improvement in thoracic adenopathy. Favored to be reactive, and possibly related to the clinical history of HIV. 2. Otherwise, no explanation for fever within the chest. Resolution of left upper lobe pneumonia. 3. Pulmonary artery enlargement suggests pulmonary arterial hypertension. 4. Centrally venous insufficiency with venous collaterals throughout the mediastinum. Favor related to left brachiocephalic vein insufficiency.  CT ABDOMEN AND PELVIS IMPRESSION  1. No explanation for fever within the abdomen or pelvis. 2. Gallstones with a contracted gallbladder. Cannot exclude a component of chronic cholecystitis. No evidence of acute inflammation. 3. Soft tissue fullness within the uterus. Question underlying fibroids. 4. Prominent inguinal nodes which are improved since the prior exam and likely reactive.   Electronically Signed   By: Abigail Miyamoto M.D.   On: 11/15/2012 18:41   Ct Abdomen Pelvis W Contrast  11/15/2012   CLINICAL DATA:  Fever. Unknown origin. HIV. Cardiomyopathy. Bacteremia. Heart failure.  EXAM: CT CHEST, ABDOMEN, AND PELVIS WITH CONTRAST  TECHNIQUE: Multidetector CT imaging of the chest, abdomen and pelvis was performed following the standard protocol during bolus administration of intravenous contrast.  CONTRAST:  125mL OMNIPAQUE IOHEXOL 300 MG/ML  SOLN  COMPARISON:  Plain film chest 11/11/2012. Chest CT 12/21/2011. Abdominal pelvic CT 07/11/2011.  FINDINGS: CT CHEST FINDINGS  Lungs/Pleura: Mild degradation by patient body habitus. Interval resolution of the previously described left upper lobe airspace disease since 12/21/2011.  No pleural fluid.  Heart/Mediastinum: No definite supraclavicular  adenopathy. Nonspecific subcentimeter right-sided thyroid nodule.  Right axillary adenopathy is decreased. Example 1.1 cm on image 11 versus 1.2 cm on the prior. Similar improvement in left axillary and bilateral subpectoral adenopathy.  Tortuous thoracic aorta. Mild cardiomegaly with venous collaterals about the mediastinum. Likely due to venous insufficiency, possibly in the left brachiocephalic vein.  Pulmonary artery enlargement, with the outflow tract measuring 3.8 cm. No central pulmonary embolism, on this non-dedicated study.  Right paratracheal node measures 9 mm on image 16 versus 1.3 cm on the prior. No hilar adenopathy. Resolved azgo-esophageal recess adenopathy.  CT ABDOMEN AND PELVIS FINDINGS  Abdomen/Pelvis: Normal liver, spleen, stomach, pancreas. Gallstones. Contracted gallbladder, without specific evidence of acute cholecystitis. No biliary ductal dilatation.  Normal adrenal glands. Markedly atrophic bilateral kidneys. Low-density left renal lesions which are likely cysts. Small retroperitoneal nodes which are decreased in size since the prior exam and not pathologic by size criteria.  Normal colon and terminal ileum. Normal small bowel without abdominal ascites.  Surgical changes in the left inguinal region. Mildly prominent inguinal nodes are decreased since the prior exam. Decompressed urinary bladder. Soft tissue fullness within a retroverted  uterus, suspicious for underlying fibroids. No adnexal mass or significant free fluid.  Bones/Musculoskeletal: Probable renal osteodystrophy with increased density of the marrow space. Loss of intervertebral disc height at the lumbosacral junction is chronic and likely related to degenerative disc disease. Concurrent disc bulge at this level.  IMPRESSION: CT CHEST IMPRESSION  1. Since 12/21/2011, interval improvement in thoracic adenopathy. Favored to be reactive, and possibly related to the clinical history of HIV. 2. Otherwise, no explanation for fever  within the chest. Resolution of left upper lobe pneumonia. 3. Pulmonary artery enlargement suggests pulmonary arterial hypertension. 4. Centrally venous insufficiency with venous collaterals throughout the mediastinum. Favor related to left brachiocephalic vein insufficiency.  CT ABDOMEN AND PELVIS IMPRESSION  1. No explanation for fever within the abdomen or pelvis. 2. Gallstones with a contracted gallbladder. Cannot exclude a component of chronic cholecystitis. No evidence of acute inflammation. 3. Soft tissue fullness within the uterus. Question underlying fibroids. 4. Prominent inguinal nodes which are improved since the prior exam and likely reactive.   Electronically Signed   By: Abigail Miyamoto M.D.   On: 11/15/2012 18:41   Medications:   . abacavir  600 mg Oral Q1200  . aspirin  81 mg Oral Q1200  . aztreonam  1 g Intravenous Q24H  . calcium acetate  1,334 mg Oral TID WC  . Darunavir Ethanolate  800 mg Oral Q breakfast  . doxercalciferol  2 mcg Intravenous Q T,Th,Sa-HD  . heparin  5,000 Units Subcutaneous Q8H  . lamiVUDine  50 mg Oral Q1200  . midodrine  10 mg Oral BID  . multivitamin  1 tablet Oral QHS  . ritonavir  100 mg Oral Q breakfast  . sodium chloride  3 mL Intravenous Q12H  . vancomycin  1,000 mg Intravenous Q T,Th,Sa-HD   Physical Exam:  General: Alert Obese BF nad, Heart: RRR 2/6 sem lsb , no rub  Lungs: CTA bilaterally  Abdomen: BS pos. Obese, soft , nontender  Extremities: Dialysis Access: L 2 + pedal edema and R 1 + pedal edema/ pos bruit L fem AVGG   Dialysis Orders: [facility]: [facility] .  EDW 87.5 kg HD Bath 2K/2.25 Ca Time 4:00 Heparin 5900. Access L thigh AVG BFR 400 DFR A1.5  Hectorol 6 mcg IV/HD Epogen 0 Units IV/HD Venofer 50 mg IV q week  Recent labs: Hgb 11.9, Tsat 49%, Ferr 693, P 5.6, PTH 205   Problem/Plan:  1. Neutropenic Fever/ SIRS - ID following and Resolving. ON Antibiotics and BC no growth / ? Cholecystitis on admit ct and elevated LFTs 2.  Transaminitis - Improving from admit AST and ALT . Likely due to Acute Illness with Hypotension.  3. ESRD - T[facility], ( [facility]) Continue  On schedule 4. Hypotension/volume - 98/ 58 bp this am. Asymptomatic On midodrine BID. Over edw By 2 kg with op history OP hypotension at kidney center limiting UF.Noted recent increases to EDW per op notes. 5. Anemia - Hgb 12.1 No ESAs. Weekly Venofer  6. Metabolic bone disease - Ca 11.4 corrected Last P 3.8 on Phoslo 2 ac. Decrease 1 tid meals/ hectorol  decrease  To 19mcg  Use 2.0 ca bath  7 Nutrition - Alb 3.0 Renal diet, multivitamin  8. HIV - ID following /Continue home meds   Ernest Haber, PA-C [place] [phone] 11/17/2012,10:42 AM  LOS: 6 days

## 2012-11-18 LAB — CBC
HCT: 31.4 % — ABNORMAL LOW (ref 36.0–46.0)
Hemoglobin: 10.3 g/dL — ABNORMAL LOW (ref 12.0–15.0)
MCH: 36.4 pg — ABNORMAL HIGH (ref 26.0–34.0)
MCHC: 32.8 g/dL (ref 30.0–36.0)
MCV: 111 fL — ABNORMAL HIGH (ref 78.0–100.0)
Platelets: 136 10*3/uL — ABNORMAL LOW (ref 150–400)
RBC: 2.83 MIL/uL — ABNORMAL LOW (ref 3.87–5.11)
RDW: 12.8 % (ref 11.5–15.5)
WBC: 3.4 10*3/uL — ABNORMAL LOW (ref 4.0–10.5)

## 2012-11-18 LAB — MAGNESIUM: Magnesium: 2.3 mg/dL (ref 1.5–2.5)

## 2012-11-18 LAB — COMPREHENSIVE METABOLIC PANEL
ALT: 39 U/L — ABNORMAL HIGH (ref 0–35)
AST: 41 U/L — ABNORMAL HIGH (ref 0–37)
Albumin: 3 g/dL — ABNORMAL LOW (ref 3.5–5.2)
Alkaline Phosphatase: 168 U/L — ABNORMAL HIGH (ref 39–117)
BUN: 30 mg/dL — ABNORMAL HIGH (ref 6–23)
CO2: 24 mEq/L (ref 19–32)
Calcium: 10.1 mg/dL (ref 8.4–10.5)
Chloride: 96 mEq/L (ref 96–112)
Creatinine, Ser: 7.66 mg/dL — ABNORMAL HIGH (ref 0.50–1.10)
GFR calc Af Amer: 6 mL/min — ABNORMAL LOW (ref >90)
GFR calc non Af Amer: 5 mL/min — ABNORMAL LOW (ref >90)
Glucose, Bld: 86 mg/dL (ref 70–99)
Potassium: 4.2 mEq/L (ref 3.5–5.1)
Sodium: 135 mEq/L (ref 135–145)
Total Bilirubin: 0.4 mg/dL (ref 0.3–1.2)
Total Protein: 8.1 g/dL (ref 6.0–8.3)

## 2012-11-18 LAB — CULTURE, BLOOD (ROUTINE X 2)
Culture: NO GROWTH
Culture: NO GROWTH

## 2012-11-18 MED ORDER — PENTAFLUOROPROP-TETRAFLUOROETH EX AERO
INHALATION_SPRAY | CUTANEOUS | Status: AC
  Administered 2012-11-18: 08:00:00
  Filled 2012-11-18: qty 103.5

## 2012-11-18 MED ORDER — HEPARIN SODIUM (PORCINE) 1000 UNIT/ML DIALYSIS: 100.0000 [IU]/kg | INTRAMUSCULAR | Status: DC | PRN

## 2012-11-18 MED ORDER — DOXERCALCIFEROL 4 MCG/2ML IV SOLN
INTRAVENOUS | Status: AC
  Administered 2012-11-18: 2 ug via INTRAVENOUS
  Filled 2012-11-18: qty 2

## 2012-11-18 MED ORDER — MIDODRINE HCL 5 MG PO TABS
ORAL_TABLET | ORAL | Status: AC
  Filled 2012-11-18: qty 1

## 2012-11-18 NOTE — Progress Notes (Signed)
Subjective: [name] is a 57 year old female with PMH of ESRD on HD TTS, Pancytopenia (baseline Hgb ~10), HIV (CD4 320, undect viral load on 09/13/12), and multiple hospitalizations for fever and bacteremia, found to be febrile to 104 at HD on 11/11/12.   Patient seen in at bedside this AM in HD. No complaints today. Denies any abdominal pain, SOB, chest pain, cough, diarrhea, fever, chills.   Objective: Vital signs in last 24 hours: Filed Vitals:   11/18/12 0830 11/18/12 0900 11/18/12 0930 11/18/12 0948  BP: 101/43 90/35 82/29  71/25  Pulse: 65 98  74  Temp:      TempSrc:      Resp:      Height:      Weight:      SpO2:       Weight change: -1 lb 1.6 oz (-0.499 kg)  Intake/Output Summary (Last 24 hours) at 11/18/12 0950 Last data filed at 11/18/12 0602  Gross per 24 hour  Intake    480 ml  Output      0 ml  Net    480 ml   Physical Exam: General: Alert, cooperative, and in no apparent distress. Feeling great today. HEENT: Vision grossly intact, oropharynx clear and non-erythematous  Neck: Full range of motion without pain, supple, no lymphadenopathy or carotid bruits Lungs: Clear to ascultation bilaterally, normal work of respiration, no wheezes, rales, ronchi Heart: Regular rate and rhythm, 2/6 systolic murmur, no gallops, or rubs Abdomen: Soft, non-tender, non-distended, normal bowel sounds Extremities: No cyanosis, clubbing, or edema. Multiple scars in UE's from previous grafts in the past. AVG in left thigh w/ thrill and bruit. Neurologic: Alert & oriented X3, cranial nerves II-XII intact, strength grossly intact, sensation intact to light touch  Lab Results: Basic Metabolic Panel:  Recent Labs Lab 11/13/12 0748  11/16/12 0912 11/17/12 0852 11/18/12 0447  NA 130*  < > 130* 137 135  K 4.6  < > 4.6 4.1 4.2  CL 91*  < > 91* 96 96  CO2 24  < > 22 30 24   GLUCOSE 120*  < > 154* 104* 86  BUN 52*  < > 53* 19 30*  CREATININE 8.46*  < > 9.77* 5.71* 7.66*  CALCIUM  9.5  < > 10.1 10.1 10.1  MG  --   --   --   --  2.3  PHOS 5.3*  --  3.8  --   --   < > = values in this interval not displayed. Liver Function Tests:  Recent Labs Lab 11/17/12 0852 11/18/12 0447  AST 52* 41*  ALT 50* 39*  ALKPHOS 182* 168*  BILITOT 0.4 0.4  PROT 8.5* 8.1  ALBUMIN 3.1* 3.0*   CBC:  Recent Labs Lab 11/16/12 0622  11/17/12 0852 11/18/12 0447  WBC 4.7  < > 2.9* 3.4*  NEUTROABS 3.3  --  1.3*  --   HGB 11.3*  < > 11.0* 10.3*  HCT 33.1*  < > 33.5* 31.4*  MCV 106.8*  < > 109.8* 111.0*  PLT 172  < > 146* 136*  < > = values in this interval not displayed. Coagulation:  Recent Labs Lab 11/12/12 0125  LABPROT 14.8  INR 1.19    Micro Results: Recent Results (from the past 240 hour(s))  CULTURE, BLOOD (ROUTINE X 2)     Status: None   Collection Time    11/11/12  6:40 PM      Result Value Range Status  Specimen Description BLOOD FOREARM LEFT   Final   Special Requests BOTTLES DRAWN AEROBIC ONLY 8CC   Final   Culture  Setup Time     Final   Value: 11/12/2012 03:05     Performed at [facility]   Culture     Final   Value: NO GROWTH 5 DAYS     Performed at [facility]   Report Status 11/18/2012 FINAL   Final  CULTURE, BLOOD (ROUTINE X 2)     Status: None   Collection Time    11/11/12  6:53 PM      Result Value Range Status   Specimen Description BLOOD ARM LEFT   Final   Special Requests BOTTLES DRAWN AEROBIC AND ANAEROBIC 10CC   Final   Culture  Setup Time     Final   Value: 11/12/2012 03:04     Performed at [facility]   Culture     Final   Value: NO GROWTH 5 DAYS     Performed at [facility]   Report Status 11/18/2012 FINAL   Final  MRSA PCR SCREENING     Status: None   Collection Time    11/11/12 11:15 PM      Result Value Range Status   MRSA by PCR NEGATIVE  NEGATIVE Final   Comment:            The GeneXpert MRSA Assay (FDA     approved for NASAL specimens     only), is one component of a      comprehensive MRSA colonization     surveillance program. It is not     intended to diagnose MRSA     infection nor to guide or     monitor treatment for     MRSA infections.  CULTURE, BLOOD (ROUTINE X 2)     Status: None   Collection Time    11/16/12  6:13 AM      Result Value Range Status   Specimen Description BLOOD LEFT HAND   Final   Special Requests BOTTLES DRAWN AEROBIC ONLY 8CC   Final   Culture  Setup Time     Final   Value: 11/16/2012 12:38     Performed at [facility]   Culture     Final   Value:        BLOOD CULTURE RECEIVED NO GROWTH TO DATE CULTURE WILL BE HELD FOR 5 DAYS BEFORE ISSUING A FINAL NEGATIVE REPORT     Performed at [facility]   Report Status PENDING   Incomplete  CULTURE, BLOOD (ROUTINE X 2)     Status: None   Collection Time    11/16/12  6:21 AM      Result Value Range Status   Specimen Description BLOOD LEFT FOREARM   Final   Special Requests BOTTLES DRAWN AEROBIC ONLY 3CC   Final   Culture  Setup Time     Final   Value: 11/16/2012 12:38     Performed at [facility]   Culture     Final   Value:        BLOOD CULTURE RECEIVED NO GROWTH TO DATE CULTURE WILL BE HELD FOR 5 DAYS BEFORE ISSUING A FINAL NEGATIVE REPORT     Performed at [facility]   Report Status PENDING   Incomplete   Studies/Results: No results found. Medications: I have reviewed the patient's current medications. Scheduled Meds: . abacavir  600 mg Oral Q1200  . aspirin  81 mg Oral Q1200  . calcium acetate  1,334 mg Oral TID WC  . Darunavir Ethanolate  800 mg Oral Q breakfast  . doxercalciferol      . doxercalciferol  2 mcg Intravenous Q T,Th,Sa-HD  . heparin  5,000 Units Subcutaneous Q8H  . lamiVUDine  50 mg Oral Q1200  . midodrine      . midodrine      . midodrine      . midodrine  10 mg Oral BID  . multivitamin  1 tablet Oral QHS  . pentafluoroprop-tetrafluoroeth      . ritonavir  100 mg Oral Q breakfast  . sodium chloride  3 mL  Intravenous Q12H   Continuous Infusions:  PRN Meds:.heparin, oxyCODONE  Assessment/Plan: [name] is a 57 year old female with PMH of ESRD on HD TTS, Pancytopenia (baseline Hgb ~10), HIV (CD4 320, undect viral load on 09/13/12), and multiple hospitalizations for fever and bacteremia, found to be febrile to 104 at HD on 11/11/12.   Patient with extensive ID history in the past as follows: 02/11/12 --> MRSA Bacteremia with HCAP, blood cx 2/6, leukocytosis (10.9), Tm 104 (TEE - no vegetations, vascular revised graft - not infected) --> treated with Vanc, Cefepime, Zyvox, Ancef --> Vanc x 6 weeks 12/19/11 --> HCAP with possible bacteremia, blood cx 1/4 gram + cocci in pairs & chains (ID thought contaminant), normal wbc, Tm 105.8 --> treated with levaquin, aztreonam, daptomycin (given VRE hx), Vanc  08/21/11 --> VRE bacteremia & C.diff, 4/4 blood cx & right chest HD catheter tip + enterococcus 80K colonies, WBC 2.8, Abs Neut 1.6, Tm 103, TEE with small calcified mobile mass on chordae tendinae --> treated with Rocephin, vanc, zyvoxx, Daptomycin --> Dapto x 2 weeks  07/11/11 --> C diff with Tm 103.6, WBC 1.6  05/18/11 --> Bacteremia (likely d/t HD catheter) with bacterial endocarditis & HCAP --> Vanc/Zosyn then Ancef x 6 weeks  12/23/10 --> MSSA bacteremia with RLL PNA --> Ancef x 6 weeks  Fever- Patient came from HD center with fever of 104 on 11/11/12. NO cultures drawn at dialysis center. Met SIRS criteria w/ fever and WBC's of 2.9. At this time there continues to be no obvious source of infection. CXR portable 1 view on admission showed no sign of respiratory source and patient denies cough, patient does not make urine, no signs of obvious infection at fistula site, no HA, AMS, or stiff neck, no diarrhea. Lactic acid was wnl on admission. Blood cultures drawn, still with no growth (01/19/12). Mild fever on 11/16/12, 100.9 Tmax. Blood cultures repeated, no growth. Given h/o endocarditis in the past and  recurrence of fever on Abx, considered TEE if further fever as patient is high risk for endocarditis. However, only fulfills 2 minor [facility] criteria at this time. -ID consulted, appreciate recommendations. CT chest/abd/pelvis shows no explanation for fever at this time (see results above), except for possible chronic cholecystitis. If fever continues, will consider TEE + add oral Flagyl.  -Discontinued Vancomycin/Aztreonam last night, no fevers.   Hypotension- Presented to ED w/ BP of 65/25. Improved after patient was bolused w/ 500 cc. Has had multiple UE vascular surgeries. -Continue Midodrine -Continue to monitor pressures on thigh if necessary, will bolus if hypotensive.   Transaminitis- AST 25 in May 2014. AST and ALT elevated to ~5 times upper limit on admission. Patient has no history of Hep B or C. She does have a history  of gallstones but does not currently have any abdominal pain. CT abdomen showed possible chronic cholecystitis, may explain transaminitis.  -Repeat LFT's this AM continue to trend down (11/18/12). Will continue to follow.   History of Endocarditis- Patient shown to have nodule on cordae in 08/2011 on TEE during admission for VRE bacteremia. Treated with Rocephin, vanc, zyvoxx, Daptomycin (dapto continued for 2 weeks). - NO further fevers at this time. - Patient does have a systolic murmur that was present on previous admissions (at least as early as Dec 2012), no noted MR, AS, or PS on TEE in February 2014.  HIV -CD4 320 in September with undect viral load  -Continue home HIV medications   Pancytopenia (chronic) -Chronic, patient did have a bone marrow Bx in 2012 that was hypercellular thought likely due to uncontrolled HIV. However last CBC in September had WBC of 4.1, mild anemia, and platelets of 170.   ESRD (end stage renal disease) on HD TTS- Nephrology following. HD today.  Dispo: Disposition is deferred at this time, awaiting improvement of current medical  problems.  Anticipated discharge today.  The patient does have a current PCP Ivin Poot, MD) and does need an [facility] hospital follow-up appointment after discharge.  The patient does not have transportation limitations that hinder transportation to clinic appointments.  .Services Needed at time of discharge: Y = Yes, Blank = No PT:   OT:   RN:   Equipment:   Other:     LOS: 7 days   Corky Sox, MD 11/18/2012, 9:50 AM Pager: [phone]

## 2012-11-18 NOTE — Discharge Summary (Signed)
Name: [name] MRN: [id number] DOB: 09/10/1955 57 y.o. PCP: Ivin Poot, MD  Date of Admission: 11/11/2012  5:17 PM Date of Discharge: 11/18/2012 Attending Physician: Dominic Pea, DO  Discharge Diagnosis: 1. Fever of unknown source- Patient presented from HD center w/ fever of 104. Started on Vancomycin and Aztreonam during admission. No fevers except for 1 on 11/16/12 of 100.9. No known source.  2. ESRD- TTS. Has L femoral AV graft 3. Transaminitis- Increased on admission, slowly decreased during course of admission. 4. Hypotension- Patient w/ known hypotension, not symptomatic.  5. HIV- On ART regimen. CD4 340 w/ undetectable VL in 09/2012.  Discharge Medications:   Medication List         abacavir 300 MG tablet  Commonly known as:  ZIAGEN  Take 600 mg by mouth daily at 12 noon.     aspirin 81 MG chewable tablet  Chew 81 mg by mouth daily at 12 noon.     b complex vitamins tablet  Take 1 tablet by mouth daily at 12 noon.     calcium acetate 667 MG capsule  Commonly known as:  PHOSLO  Take 2 capsules (1,334 mg total) by mouth 3 (three) times daily with meals.     CERTAGEN PO  Take 1 tablet by mouth daily at 12 noon.     Darunavir Ethanolate 800 MG tablet  Commonly known as:  PREZISTA  Take 1 tablet (800 mg total) by mouth daily with breakfast.     ethyl chloride spray  Apply 1 application topically 3 (three) times a week. To graft for dialysis Tuesday, Thursday and Saturday     guaifenesin 100 MG/5ML syrup  Commonly known as:  ROBITUSSIN  Take 200 mg by mouth every 6 (six) hours as needed for cough or congestion.     lamiVUDine 10 MG/ML solution  Commonly known as:  EPIVIR  Take 50 mg by mouth daily at 12 noon.     midodrine 10 MG tablet  Commonly known as:  PROAMATINE  Take 10 mg by mouth 2 (two) times daily. At 630am and 10pm     oxyCODONE 5 MG immediate release tablet  Commonly known as:  Oxy IR/ROXICODONE  Take 5 mg by mouth every 6 (six)  hours as needed (for pain).     ritonavir 100 MG Tabs tablet  Commonly known as:  NORVIR  Take 1 tablet (100 mg total) by mouth daily with breakfast.        Disposition and follow-up:   [name] was discharged from [facility] in Good condition.  At the hospital follow up visit please address:  1.  Please address presence of fever, pain at graft site? BP assessment on R. Thigh. Has patient felt well since discharge?  2.  Labs / imaging needed at time of follow-up: CBC, CMET  3.  Pending labs/ test needing follow-up: none  Follow-up Appointments: Follow-up Information   Follow up with Dorian Heckle, MD On 11/26/2012. (10:45 AM)    Specialty:  Internal Medicine   Contact information:   [place] [address] [phone]       Discharge Instructions: Discharge Orders   Future Appointments Provider Department Dept Phone   11/26/2012 10:45 AM Jeralene Huff, MD [facility] [phone]   11/30/2012 3:45 PM Thayer Headings, MD [facility] for Infectious Disease [phone]   01/17/2013 9:45 AM Truman Hayward, MD [facility]  Center for Infectious Disease [phone]   Future Orders Complete By Expires   Call MD for:  persistant nausea and vomiting  As directed    Call MD for:  redness, tenderness, or signs of infection (pain, swelling, redness, odor or green/yellow discharge around incision site)  As directed    Call MD for:  temperature >100.4  As directed    Increase activity slowly  As directed       Consultations: Treatment Team:  Sol Blazing, MD Infectious Disease; Tommy Medal  Procedures Performed:  Ct Chest W Contrast  11/15/2012   CLINICAL DATA:  Fever. Unknown origin. HIV. Cardiomyopathy. Bacteremia. Heart failure.  EXAM: CT CHEST, ABDOMEN, AND PELVIS WITH CONTRAST  TECHNIQUE: Multidetector CT imaging of the chest, abdomen and pelvis was performed following  the standard protocol during bolus administration of intravenous contrast.  CONTRAST:  169mL OMNIPAQUE IOHEXOL 300 MG/ML  SOLN  COMPARISON:  Plain film chest 11/11/2012. Chest CT 12/21/2011. Abdominal pelvic CT 07/11/2011.  FINDINGS: CT CHEST FINDINGS  Lungs/Pleura: Mild degradation by patient body habitus. Interval resolution of the previously described left upper lobe airspace disease since 12/21/2011.  No pleural fluid.  Heart/Mediastinum: No definite supraclavicular adenopathy. Nonspecific subcentimeter right-sided thyroid nodule.  Right axillary adenopathy is decreased. Example 1.1 cm on image 11 versus 1.2 cm on the prior. Similar improvement in left axillary and bilateral subpectoral adenopathy.  Tortuous thoracic aorta. Mild cardiomegaly with venous collaterals about the mediastinum. Likely due to venous insufficiency, possibly in the left brachiocephalic vein.  Pulmonary artery enlargement, with the outflow tract measuring 3.8 cm. No central pulmonary embolism, on this non-dedicated study.  Right paratracheal node measures 9 mm on image 16 versus 1.3 cm on the prior. No hilar adenopathy. Resolved azgo-esophageal recess adenopathy.  CT ABDOMEN AND PELVIS FINDINGS  Abdomen/Pelvis: Normal liver, spleen, stomach, pancreas. Gallstones. Contracted gallbladder, without specific evidence of acute cholecystitis. No biliary ductal dilatation.  Normal adrenal glands. Markedly atrophic bilateral kidneys. Low-density left renal lesions which are likely cysts. Small retroperitoneal nodes which are decreased in size since the prior exam and not pathologic by size criteria.  Normal colon and terminal ileum. Normal small bowel without abdominal ascites.  Surgical changes in the left inguinal region. Mildly prominent inguinal nodes are decreased since the prior exam. Decompressed urinary bladder. Soft tissue fullness within a retroverted uterus, suspicious for underlying fibroids. No adnexal mass or significant free fluid.   Bones/Musculoskeletal: Probable renal osteodystrophy with increased density of the marrow space. Loss of intervertebral disc height at the lumbosacral junction is chronic and likely related to degenerative disc disease. Concurrent disc bulge at this level.  IMPRESSION: CT CHEST IMPRESSION  1. Since 12/21/2011, interval improvement in thoracic adenopathy. Favored to be reactive, and possibly related to the clinical history of HIV. 2. Otherwise, no explanation for fever within the chest. Resolution of left upper lobe pneumonia. 3. Pulmonary artery enlargement suggests pulmonary arterial hypertension. 4. Centrally venous insufficiency with venous collaterals throughout the mediastinum. Favor related to left brachiocephalic vein insufficiency.  CT ABDOMEN AND PELVIS IMPRESSION  1. No explanation for fever within the abdomen or pelvis. 2. Gallstones with a contracted gallbladder. Cannot exclude a component of chronic cholecystitis. No evidence of acute inflammation. 3. Soft tissue fullness within the uterus. Question underlying fibroids. 4. Prominent inguinal nodes which are improved since the prior exam and likely reactive.   Electronically Signed   By: Abigail Miyamoto M.D.   On: 11/15/2012 18:41   Ct Abdomen Pelvis W Contrast  11/15/2012   CLINICAL DATA:  Fever. Unknown origin. HIV. Cardiomyopathy. Bacteremia. Heart failure.  EXAM: CT CHEST, ABDOMEN, AND PELVIS WITH CONTRAST  TECHNIQUE: Multidetector CT imaging of the chest, abdomen and pelvis was performed following the standard protocol during bolus administration of intravenous contrast.  CONTRAST:  148mL OMNIPAQUE IOHEXOL 300 MG/ML  SOLN  COMPARISON:  Plain film chest 11/11/2012. Chest CT 12/21/2011. Abdominal pelvic CT 07/11/2011.  FINDINGS: CT CHEST FINDINGS  Lungs/Pleura: Mild degradation by patient body habitus. Interval resolution of the previously described left upper lobe airspace disease since 12/21/2011.  No pleural fluid.  Heart/Mediastinum: No definite  supraclavicular adenopathy. Nonspecific subcentimeter right-sided thyroid nodule.  Right axillary adenopathy is decreased. Example 1.1 cm on image 11 versus 1.2 cm on the prior. Similar improvement in left axillary and bilateral subpectoral adenopathy.  Tortuous thoracic aorta. Mild cardiomegaly with venous collaterals about the mediastinum. Likely due to venous insufficiency, possibly in the left brachiocephalic vein.  Pulmonary artery enlargement, with the outflow tract measuring 3.8 cm. No central pulmonary embolism, on this non-dedicated study.  Right paratracheal node measures 9 mm on image 16 versus 1.3 cm on the prior. No hilar adenopathy. Resolved azgo-esophageal recess adenopathy.  CT ABDOMEN AND PELVIS FINDINGS  Abdomen/Pelvis: Normal liver, spleen, stomach, pancreas. Gallstones. Contracted gallbladder, without specific evidence of acute cholecystitis. No biliary ductal dilatation.  Normal adrenal glands. Markedly atrophic bilateral kidneys. Low-density left renal lesions which are likely cysts. Small retroperitoneal nodes which are decreased in size since the prior exam and not pathologic by size criteria.  Normal colon and terminal ileum. Normal small bowel without abdominal ascites.  Surgical changes in the left inguinal region. Mildly prominent inguinal nodes are decreased since the prior exam. Decompressed urinary bladder. Soft tissue fullness within a retroverted uterus, suspicious for underlying fibroids. No adnexal mass or significant free fluid.  Bones/Musculoskeletal: Probable renal osteodystrophy with increased density of the marrow space. Loss of intervertebral disc height at the lumbosacral junction is chronic and likely related to degenerative disc disease. Concurrent disc bulge at this level.  IMPRESSION: CT CHEST IMPRESSION  1. Since 12/21/2011, interval improvement in thoracic adenopathy. Favored to be reactive, and possibly related to the clinical history of HIV. 2. Otherwise, no  explanation for fever within the chest. Resolution of left upper lobe pneumonia. 3. Pulmonary artery enlargement suggests pulmonary arterial hypertension. 4. Centrally venous insufficiency with venous collaterals throughout the mediastinum. Favor related to left brachiocephalic vein insufficiency.  CT ABDOMEN AND PELVIS IMPRESSION  1. No explanation for fever within the abdomen or pelvis. 2. Gallstones with a contracted gallbladder. Cannot exclude a component of chronic cholecystitis. No evidence of acute inflammation. 3. Soft tissue fullness within the uterus. Question underlying fibroids. 4. Prominent inguinal nodes which are improved since the prior exam and likely reactive.   Electronically Signed   By: Abigail Miyamoto M.D.   On: 11/15/2012 18:41   Dg Chest Port 1 View  11/11/2012   CLINICAL DATA:  Fever. Short of breath. Dialysis patient.  EXAM: PORTABLE CHEST - 1 VIEW  COMPARISON:  03/29/2012.  FINDINGS: Apical lordotic projection. Cardiopericardial silhouette within normal limits. Interval removal of right IJ dialysis catheter. No airspace disease or effusion. Surgical clips in the axilla bilaterally.  IMPRESSION: No active cardiopulmonary disease.   Electronically Signed   By: Dereck Ligas M.D.   On: 11/11/2012 18:01   Admission HPI: [name] is a 57 year old female with PMH of ESRD on HD TTS, Pancytopenia (baseline Hgb ), HIV (  CD4 320, undect viral load on 09/13/12), and multiple hospitalizations for fever and found to be bacteremic. Complicated Infectious disease history, not limited to summary below:   02/11/12 --> MRSA Bacteremia with HCAP, blood cx 2/6, leukocytosis (10.9), Tm 104 (TEE - no vegetations, vascular revised graft - not infected) --> treated with Vanc, Cefepime, Zyvox, Ancef --> Vanc x 6 weeks  12/19/11 --> HCAP with possible bacteremia, blood cx 1/4 gram + cocci in pairs & chains (ID thought contaminant), normal wbc, Tm 105.8 --> treated with levaquin, aztreonam, daptomycin (given  VRE hx), Vanc  08/21/11 --> VRE bacteremia & C.diff, 4/4 blood cx & right chest HD catheter tip + enterococcus 80K colonies, WBC 2.8, Abs Neut 1.6, Tm 103, TEE with small calcified mobile mass on chordae tendinae --> treated with Rocephin, vanc, zyvoxx, Daptomycin --> Dapto x 2 weeks  07/11/11 --> C diff with Tm 103.6, WBC 1.6  05/18/11 --> Bacteremia (likely d/t HD catheter) with bacterial endocarditis & HCAP --> Vanc/Zosyn then Ancef x 6 weeks  12/23/10 --> MSSA bacteremia with RLL PNA --> Ancef x 6 weeks   Patient went to dialysis today per her normal schedule. At dialysis she was noted to have a fever of 104. She was given tylenol. On return to [facility] her temperature was checked and was noted to be 102. She was then sent to the [facility] to be evaluated given her history. On presentation to the ED she was found to have a oral temp of 99.8 and a BP of 65/25 with an ANC of 900.  Per the patient she has remained asymptomatic throughout the day. She denies subjective fever or chills, denies any pain, denies cough or SOB, does not make urine, denies any HA or neck stiffness. She denies any open wounds or sores, denies any irritation at her fistula site. Her BP tends to run low after HD and she takes midodrine for this, she reports taking her AM dose but has not yet taken her PM dose.  Hospital Course by problem list:   1. Fever of unknown source- Patient presented from HD on 11/11/12 w/ fever of 104. No cultures drawn at HD. Patient has a very extensive history of bacteremia in the past w/ MRSA, VRE, etc. And has also had IE in the past (details mentioned in HPI above). Patient technically fulfilled SIRS criteria w/ fever and WBC's of 2.9 on admission. Also w/ HoTN on admission, given 500 ml bolus in ED w/ appropriate response. Started on Vancomycin and Aztreonam (PCN allergy) for broad coverage as patient showed no source of infection. CXR was clear, no urine production as patient is ESRD, no diarrhea, no  nausea/vomiting. Patient also w/ a history of HD catheter infection, but L thigh AVG w/out evidence of infection despite site. Given the history of IE, TEE was considered, however, given her hospital course and relative absence of fever, no indication for TEE given the fact that she only fulfills 2 minor [facility]'s criteria. CT chest, abdomen, pelvis performed to rule out occult infection, which only showed possible chronic cholecystitis, a possible cause of fever. On 11/16/12, patient did have one minor spike in fever, Tmax of 100.9, but later in the morning returned to 99.2. No further fevers during admission, and no reason for change in ABx management.   2. ESRD- Patient w/ TTS scheduled HD, w/ no recent complications. Has had b/l UE AVG in the past w/ complications, now with L thigh AVG, w/ good thrill and bruit.  Received 3 HD sessions during her hospitalization without any issues. Closely followed by nephrology team.   3. Transaminitis- Patient presented with AST and ALT elevated to ~5 times upper limit. Continued to follow daily, w/ reasonable decrease in LFT's during admission. Almost completely wnl by discharge. May be 2/2 chronic cholecystitis as suggested by CT abdomen.   4. Hypotension- Presented w/ SBP in the 60's. Given 500 ml bolus w/ appropriate response. Continued to have low BP readings when checked on UE's, however, patient w/ failed AVG in UEs in the past. When re-checked on right thigh, BP readings much higher and generally close to normal range. Patient not symptomatic during episode of HoTN.   5. HIV- Recently complaint with medications. CD4 and VL in 09/2012 320 and undetectable, respectively. Follows with [facility].   Discharge Vitals:   BP 120/46  Pulse 76  Temp(Src) 97.9 F (36.6 C) (Oral)  Resp 18  Ht 5\' 8"  (1.727 m)  Wt 200 lb 6.4 oz (90.9 kg)  BMI 30.48 kg/m2  SpO2 100%  Discharge Labs:  Results for orders placed during the hospital encounter of 11/11/12 (from the past 24  hour(s))  CBC     Status: Abnormal   Collection Time    11/18/12  4:47 AM      Result Value Range   WBC 3.4 (*) 4.0 - 10.5 K/uL   RBC 2.83 (*) 3.87 - 5.11 MIL/uL   Hemoglobin 10.3 (*) 12.0 - 15.0 g/dL   HCT 31.4 (*) 36.0 - 46.0 %   MCV 111.0 (*) 78.0 - 100.0 fL   MCH 36.4 (*) 26.0 - 34.0 pg   MCHC 32.8  30.0 - 36.0 g/dL   RDW 12.8  11.5 - 15.5 %   Platelets 136 (*) 150 - 400 K/uL  COMPREHENSIVE METABOLIC PANEL     Status: Abnormal   Collection Time    11/18/12  4:47 AM      Result Value Range   Sodium 135  135 - 145 mEq/L   Potassium 4.2  3.5 - 5.1 mEq/L   Chloride 96  96 - 112 mEq/L   CO2 24  19 - 32 mEq/L   Glucose, Bld 86  70 - 99 mg/dL   BUN 30 (*) 6 - 23 mg/dL   Creatinine, Ser 7.66 (*) 0.50 - 1.10 mg/dL   Calcium 10.1  8.4 - 10.5 mg/dL   Total Protein 8.1  6.0 - 8.3 g/dL   Albumin 3.0 (*) 3.5 - 5.2 g/dL   AST 41 (*) 0 - 37 U/L   ALT 39 (*) 0 - 35 U/L   Alkaline Phosphatase 168 (*) 39 - 117 U/L   Total Bilirubin 0.4  0.3 - 1.2 mg/dL   GFR calc non Af Amer 5 (*) >90 mL/min   GFR calc Af Amer 6 (*) >90 mL/min  MAGNESIUM     Status: None   Collection Time    11/18/12  4:47 AM      Result Value Range   Magnesium 2.3  1.5 - 2.5 mg/dL    Signed: Corky Sox, MD 11/18/2012, 4:11 PM   Time Spent on Discharge: 35 minutes Services Ordered on Discharge: none Equipment Ordered on Discharge: none

## 2012-11-18 NOTE — Progress Notes (Signed)
[facility] for Infectious Disease      Subjective: No new complaints  Antibiotics:  Anti-infectives   Start     Dose/Rate Route Frequency Ordered Stop   11/13/12 2000  levofloxacin (LEVAQUIN) IVPB 500 mg  Status:  Discontinued     500 mg 100 mL/hr over 60 Minutes Intravenous Every 48 hours 11/11/12 2011 11/11/12 2320   11/13/12 1200  vancomycin (VANCOCIN) IVPB 1000 mg/200 mL premix  Status:  Discontinued     1,000 mg 200 mL/hr over 60 Minutes Intravenous Every T-Th-Sa (Hemodialysis) 11/12/12 1434 11/17/12 2026   11/12/12 2100  aztreonam (AZACTAM) 1 g in dextrose 5 % 50 mL IVPB  Status:  Discontinued     1 g 100 mL/hr over 30 Minutes Intravenous Every 24 hours 11/11/12 2011 11/17/12 2026   11/12/12 1445  vancomycin (VANCOCIN) IVPB 1000 mg/200 mL premix     1,000 mg 200 mL/hr over 60 Minutes Intravenous  Once 11/12/12 1434 11/12/12 1606   11/12/12 1200  lamiVUDine (EPIVIR) 10 MG/ML solution 50 mg     50 mg Oral Daily 11/11/12 2322     11/12/12 1200  abacavir (ZIAGEN) tablet 600 mg     600 mg Oral Daily 11/11/12 2322     11/12/12 0700  Darunavir Ethanolate (PREZISTA) tablet 800 mg     800 mg Oral Daily with breakfast 11/11/12 2322     11/12/12 0700  ritonavir (NORVIR) tablet 100 mg     100 mg Oral Daily with breakfast 11/11/12 2322     11/11/12 2030  vancomycin (VANCOCIN) IVPB 750 mg/150 ml premix     750 mg 150 mL/hr over 60 Minutes Intravenous  Once 11/11/12 2029 11/12/12 0053   11/11/12 1945  levofloxacin (LEVAQUIN) IVPB 750 mg     750 mg 100 mL/hr over 90 Minutes Intravenous  Once 11/11/12 1940 11/11/12 2200   11/11/12 1945  aztreonam (AZACTAM) 2 g in dextrose 5 % 50 mL IVPB     2 g 100 mL/hr over 30 Minutes Intravenous  Once 11/11/12 1940 11/11/12 2250   11/11/12 1945  vancomycin (VANCOCIN) IVPB 1000 mg/200 mL premix  Status:  Discontinued     1,000 mg 200 mL/hr over 60 Minutes Intravenous  Once 11/11/12 1940 11/12/12 1433      Medications: Scheduled  Meds: . abacavir  600 mg Oral Q1200  . aspirin  81 mg Oral Q1200  . calcium acetate  1,334 mg Oral TID WC  . Darunavir Ethanolate  800 mg Oral Q breakfast  . doxercalciferol  2 mcg Intravenous Q T,Th,Sa-HD  . heparin  5,000 Units Subcutaneous Q8H  . lamiVUDine  50 mg Oral Q1200  . midodrine      . midodrine  10 mg Oral BID  . multivitamin  1 tablet Oral QHS  . ritonavir  100 mg Oral Q breakfast  . sodium chloride  3 mL Intravenous Q12H   Continuous Infusions:  PRN Meds:.oxyCODONE   Objective: Weight change: -1 lb 1.6 oz (-0.499 kg)  Intake/Output Summary (Last 24 hours) at 11/18/12 1349 Last data filed at 11/18/12 1142  Gross per 24 hour  Intake    480 ml  Output   1948 ml  Net  -1468 ml   Blood pressure 120/46, pulse 76, temperature 97.9 F (36.6 C), temperature source Oral, resp. rate 18, height 5\' 8"  (1.727 m), weight 200 lb 6.4 oz (90.9 kg), SpO2 100.00%. Temp:  [97.9 F (36.6 C)-98.7 F (37.1 C)] 97.9  F (36.6 C) (11/13 1241) Pulse Rate:  [62-98] 76 (11/13 1241) Resp:  [14-18] 18 (11/13 1241) BP: (62-120)/(25-70) 120/46 mmHg (11/13 1241) SpO2:  [96 %-100 %] 100 % (11/13 1241) Weight:  [200 lb 6.4 oz (90.9 kg)-205 lb 4 oz (93.101 kg)] 200 lb 6.4 oz (90.9 kg) (11/13 1142)  Physical Exam: General: Alert and awake, oriented x3 receiving HD  HEENT:, EOMI CVS regular rate, II/VI systolic murmur Chest: clear to auscultation bilaterally, no wheezing, rales or rhonchi Abdomen: soft nontender, nondistended, normal bowel sounds, Neuro: nonfocal  Lab Results:  Recent Labs  11/17/12 0852 11/18/12 0447  WBC 2.9* 3.4*  HGB 11.0* 10.3*  HCT 33.5* 31.4*  PLT 146* 136*    BMET  Recent Labs  11/17/12 0852 11/18/12 0447  NA 137 135  K 4.1 4.2  CL 96 96  CO2 30 24  GLUCOSE 104* 86  BUN 19 30*  CREATININE 5.71* 7.66*  CALCIUM 10.1 10.1    Micro Results: Recent Results (from the past 240 hour(s))  CULTURE, BLOOD (ROUTINE X 2)     Status: None   Collection  Time    11/11/12  6:40 PM      Result Value Range Status   Specimen Description BLOOD FOREARM LEFT   Final   Special Requests BOTTLES DRAWN AEROBIC ONLY 8CC   Final   Culture  Setup Time     Final   Value: 11/12/2012 03:05     Performed at [facility]   Culture     Final   Value: NO GROWTH 5 DAYS     Performed at [facility]   Report Status 11/18/2012 FINAL   Final  CULTURE, BLOOD (ROUTINE X 2)     Status: None   Collection Time    11/11/12  6:53 PM      Result Value Range Status   Specimen Description BLOOD ARM LEFT   Final   Special Requests BOTTLES DRAWN AEROBIC AND ANAEROBIC 10CC   Final   Culture  Setup Time     Final   Value: 11/12/2012 03:04     Performed at [facility]   Culture     Final   Value: NO GROWTH 5 DAYS     Performed at [facility]   Report Status 11/18/2012 FINAL   Final  MRSA PCR SCREENING     Status: None   Collection Time    11/11/12 11:15 PM      Result Value Range Status   MRSA by PCR NEGATIVE  NEGATIVE Final   Comment:            The GeneXpert MRSA Assay (FDA     approved for NASAL specimens     only), is one component of a     comprehensive MRSA colonization     surveillance program. It is not     intended to diagnose MRSA     infection nor to guide or     monitor treatment for     MRSA infections.  CULTURE, BLOOD (ROUTINE X 2)     Status: None   Collection Time    11/16/12  6:13 AM      Result Value Range Status   Specimen Description BLOOD LEFT HAND   Final   Special Requests BOTTLES DRAWN AEROBIC ONLY 8CC   Final   Culture  Setup Time     Final   Value: 11/16/2012 12:38     Performed at [facility]   Culture     Final   Value:        BLOOD CULTURE RECEIVED NO GROWTH TO DATE CULTURE WILL BE HELD FOR 5 DAYS BEFORE ISSUING A FINAL NEGATIVE REPORT     Performed at [facility]   Report Status PENDING   Incomplete  CULTURE, BLOOD (ROUTINE X 2)     Status: None   Collection Time     11/16/12  6:21 AM      Result Value Range Status   Specimen Description BLOOD LEFT FOREARM   Final   Special Requests BOTTLES DRAWN AEROBIC ONLY 3CC   Final   Culture  Setup Time     Final   Value: 11/16/2012 12:38     Performed at [facility]   Culture     Final   Value:        BLOOD CULTURE RECEIVED NO GROWTH TO DATE CULTURE WILL BE HELD FOR 5 DAYS BEFORE ISSUING A FINAL NEGATIVE REPORT     Performed at [facility]   Report Status PENDING   Incomplete    Studies/Results: No results found.    Assessment/Plan: TEALA KOLBO is a 57 y.o. female with  HIV (well controlled ( ESRD on HD with mx admissions for MSSA and MRSA bacteremia includign MSSA endocarditis recurrent CDI admitted for fever hypotension but no clear source  #1 FUO: not clear what source is/was of fevers.and they seem now to have resolved again. CT read ? Chronic cholecystitis and with LFT up at admission this could indeed have been a case of cholecystitis.   Fevers resolve,  DC to SNF   #2 HIV: continue her current regimen  She has HSFU with me in January but I would like her to be seen by either myself or clinic ID MD in the next 2-3 weeks   LOS: 7 days   Alcide Evener 11/18/2012, 1:49 PM

## 2012-11-18 NOTE — Procedures (Signed)
I was present at this dialysis session. I have reviewed the session itself and made appropriate changes.   Some low BP, reduce UF to 3L. Pt w/o complaint.s Wants to go home.  Qb 400 AVG.    Pearson Grippe  MD 11/18/2012, 8:52 AM

## 2012-11-18 NOTE — Progress Notes (Signed)
Pt prepared for d/c to SNF. IV d/c'd. Skin intact except as most recently charted. Vitals are stable. Report called to [facility] at [facility] (receiving facility). Pt to be transported by ambulance service.  Jillyn Ledger, MBA, BS, RN

## 2012-11-19 ENCOUNTER — Other Ambulatory Visit: Payer: Self-pay | Admitting: *Deleted

## 2012-11-19 MED ORDER — OXYCODONE HCL 5 MG PO TABS: 5.0000 mg | ORAL_TABLET | Freq: Four times a day (QID) | ORAL | Status: DC | PRN

## 2012-11-19 NOTE — Discharge Summary (Signed)
  Date: 11/19/2012  Patient name: [name]  Medical record number: [id number]  Date of birth: 12-08-1955   This patient has been discussed with the house staff. Please see their note for complete details. I concur with their findings and plan.  Dominic Pea, DO, [facility] 11/19/2012, 10:43 AM

## 2012-11-22 LAB — CULTURE, BLOOD (ROUTINE X 2)
Culture: NO GROWTH
Culture: NO GROWTH

## 2012-11-26 ENCOUNTER — Encounter: Payer: Self-pay | Admitting: Internal Medicine

## 2012-11-26 ENCOUNTER — Ambulatory Visit: Payer: Self-pay | Admitting: Internal Medicine

## 2012-11-30 ENCOUNTER — Inpatient Hospital Stay: Payer: Self-pay | Admitting: Internal Medicine

## 2012-12-14 IMAGING — CR DG CHEST 1V PORT
1 series · 1 of 1 positions shown · non-contrast
Comparison: 05/19/2011

CLINICAL DATA: TDC placement

PORTABLE CHEST - 1 VIEW

[AP]
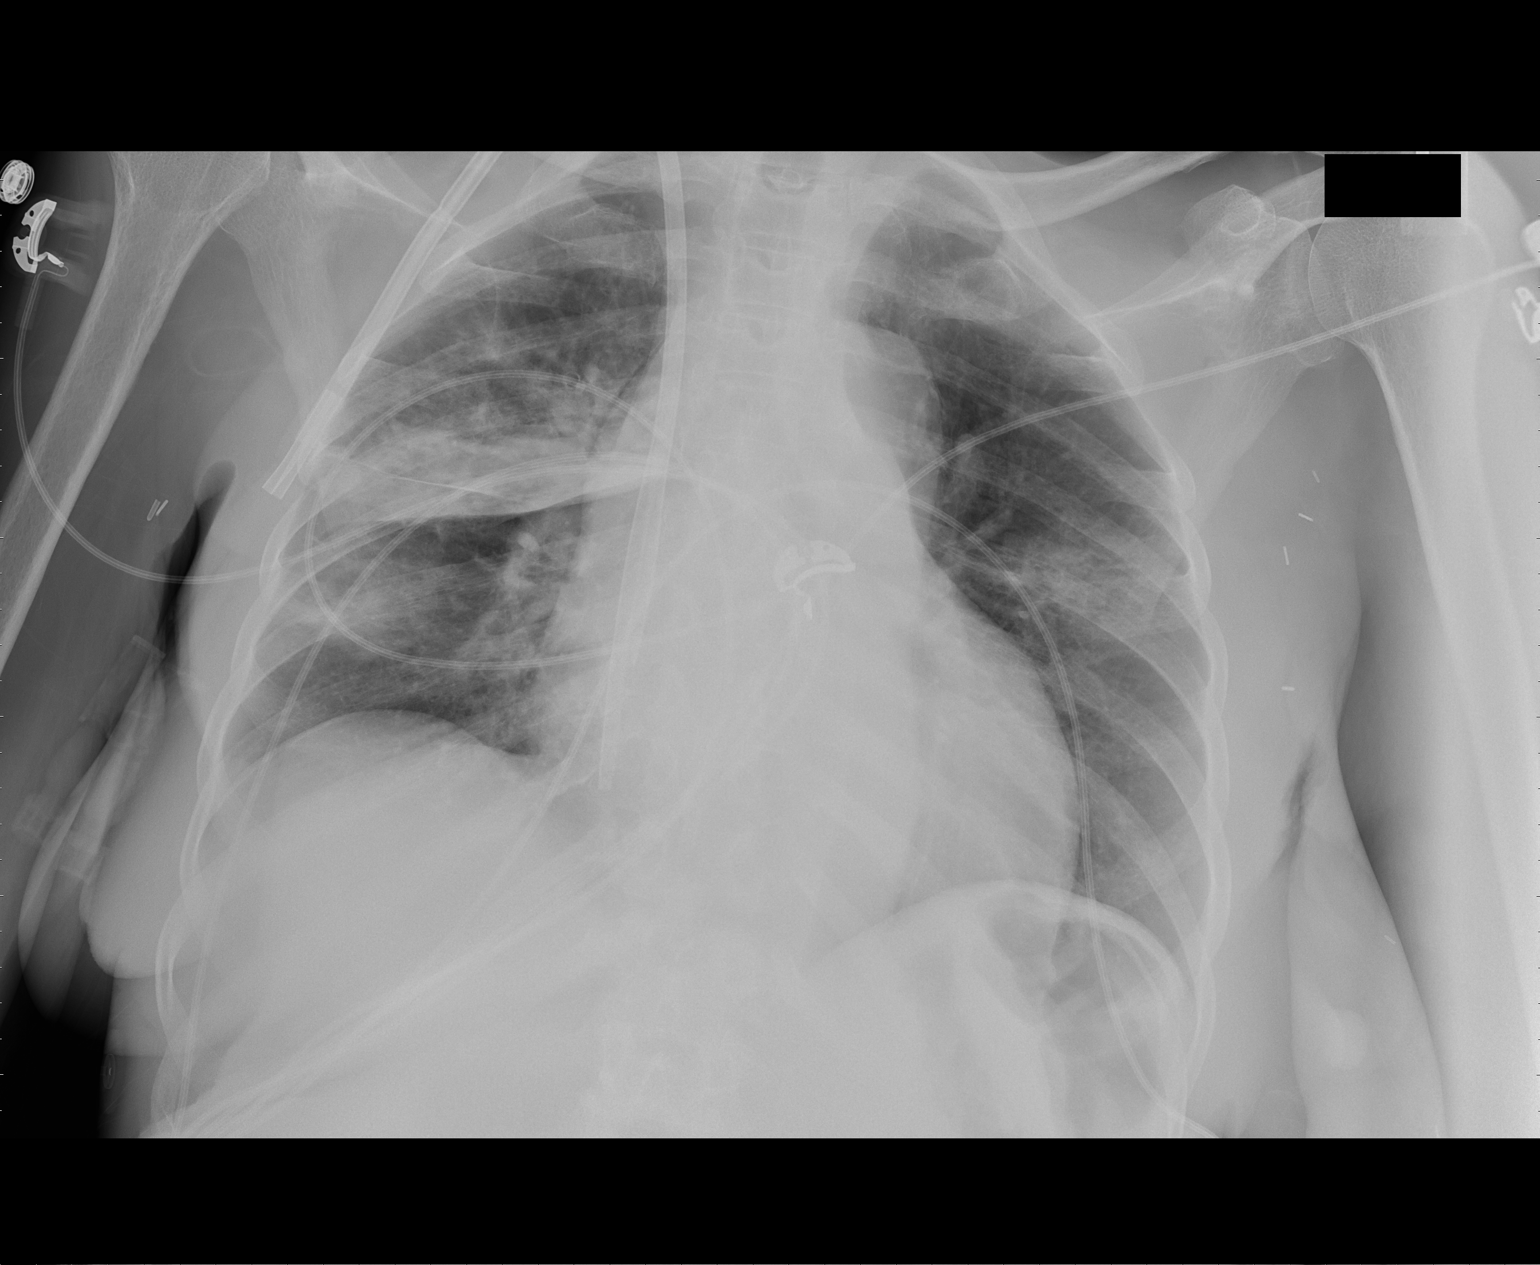

[1 of 1 positions shown; findings below may reference images not displayed]

FINDINGS: Cardiomediastinal silhouette is stable.  There is a dual
lumen right IJ catheter with tip in right atrium.  No diagnostic
pneumothorax.  Bilateral perihilar airspace disease right greater
than left again noted.  Surgical clips in the left axilla again
noted.
IMPRESSION: Dual lumen right IJ catheter with tip in right atrium.  No
diagnostic pneumothorax.  Again noted bilateral perihilar airspace
disease right greater than left.

## 2012-12-23 ENCOUNTER — Non-Acute Institutional Stay (SKILLED_NURSING_FACILITY): Payer: Medicaid Other | Admitting: Internal Medicine

## 2012-12-23 DIAGNOSIS — I951 Orthostatic hypotension: Secondary | ICD-10-CM

## 2012-12-23 DIAGNOSIS — N186 End stage renal disease: Secondary | ICD-10-CM

## 2012-12-23 DIAGNOSIS — B20 Human immunodeficiency virus [HIV] disease: Secondary | ICD-10-CM

## 2012-12-25 ENCOUNTER — Encounter: Payer: Self-pay | Admitting: Internal Medicine

## 2012-12-25 NOTE — Progress Notes (Signed)
PROGRESS NOTE  DATE: 12-23-12  FACILITY: [facility]  LEVEL OF CARE: SNF  Routine Visit  CHIEF COMPLAINT:  Manage AIDS and orthostatic hypotension  HISTORY OF PRESENT ILLNESS:  REASSESSMENT OF ONGOING PROBLEM(S):  AIDS:  Patient denies any acute symptoms. She is tolerating her medications without any side effects. She is followed by infectious disease.  ORTHOSTATIC HYPOTENSION: The orthostatcic hypotension remains stable.  Pt is tolerating current medications without any adverse reactions.  No dizziness or lightheadedness reported.  PAST MEDICAL HISTORY : Reviewed.  No changes.  CURRENT MEDICATIONS: Reviewed per [facility]  REVIEW OF SYSTEMS:  GENERAL: no change in appetite, no fatigue, no weight changes, no fever, chills or weakness RESPIRATORY: no cough, SOB, DOE, wheezing, hemoptysis CARDIAC: no chest pain, or palpitations, complains of bilateral lower extremity swelling GI: no abdominal pain, diarrhea, constipation, heart burn, nausea or vomiting  PHYSICAL EXAMINATION  VS:  T 98.6     P 68    RR 20      BP 90/60    POX %     WT (Lb) 194.5  GENERAL: no acute distress, obese body habitus NECK: supple, trachea midline, no neck masses, no thyroid tenderness, no thyromegaly RESPIRATORY: breathing is even & unlabored, BS CTAB CARDIAC: RRR, no murmur,no extra heart sounds, left lower extremity has +3 edema and right lower extremity has +2 edema GI: abdomen soft, normal BS, no masses, no tenderness, no hepatomegaly, no splenomegaly PSYCHIATRIC: the patient is alert & oriented to person, affect & behavior appropriate  LABS/RADIOLOGY:  11-14 WBC 2.5, hemoglobin 11.5, MCV 104, platelet 163  8-14 MCV 111 otherwise CBC normal, BUN 45, creatinine 6, alkaline phosphatase 395, total protein 9 otherwise CMP normal, vitamin B12 level 1308, foloic acid level  greater than 19.9 2/14 hemoglobin 10.5, MCV 108 otherwise CBC normal, BUN 25, creatinine 5.3, total protein 8.9 otherwise CMP  normal, CD4 330, RPR nonreactive   ASSESSMENT/PLAN:  orthostatic hypotension-continue midodrine. AIDS-continue current medications. Managed by infectious disease. end-stage renal disease-on hemodialysis. hyperphosphatemia-on PhosLo.  CPT CODE: 91478

## 2013-01-17 ENCOUNTER — Ambulatory Visit (INDEPENDENT_AMBULATORY_CARE_PROVIDER_SITE_OTHER): Payer: Medicaid Other | Admitting: Infectious Disease

## 2013-01-17 ENCOUNTER — Encounter: Payer: Self-pay | Admitting: Infectious Disease

## 2013-01-17 ENCOUNTER — Other Ambulatory Visit: Payer: Self-pay | Admitting: Infectious Disease

## 2013-01-17 VITALS — BP 100/64 | HR 92 | Temp 98.8°F | Wt 196.0 lb

## 2013-01-17 DIAGNOSIS — B9562 Methicillin resistant Staphylococcus aureus infection as the cause of diseases classified elsewhere: Secondary | ICD-10-CM

## 2013-01-17 DIAGNOSIS — A4902 Methicillin resistant Staphylococcus aureus infection, unspecified site: Secondary | ICD-10-CM

## 2013-01-17 DIAGNOSIS — N186 End stage renal disease: Secondary | ICD-10-CM

## 2013-01-17 DIAGNOSIS — B20 Human immunodeficiency virus [HIV] disease: Secondary | ICD-10-CM

## 2013-01-17 DIAGNOSIS — I959 Hypotension, unspecified: Secondary | ICD-10-CM

## 2013-01-17 DIAGNOSIS — A0472 Enterocolitis due to Clostridium difficile, not specified as recurrent: Secondary | ICD-10-CM

## 2013-01-17 DIAGNOSIS — Z992 Dependence on renal dialysis: Secondary | ICD-10-CM

## 2013-01-17 DIAGNOSIS — R7881 Bacteremia: Secondary | ICD-10-CM

## 2013-01-17 DIAGNOSIS — Z23 Encounter for immunization: Secondary | ICD-10-CM

## 2013-01-17 LAB — HEPATITIS PANEL, ACUTE
HCV Ab: NEGATIVE
Hep A IgM: NONREACTIVE
Hep B C IgM: NONREACTIVE
Hepatitis B Surface Ag: NEGATIVE

## 2013-01-17 LAB — RPR

## 2013-01-17 NOTE — Progress Notes (Signed)
  Subjective:    Patient ID: [name], female    DOB: 04-Dec-1955, 58 y.o.   MRN: [id number]  HPI   58 year old [name] with HIV currently perfectly controlled and ESRD on HD residing in [facility].   She is doing well with no fevers, chills or malaise. She has no pain in any joints and no back pain. She denies diarrhea. She is adherent with ARV given in SNF.  Her viral load is undetectable and CD4 count is healthy above 300 when last checked.   Dr. Dellia Nims is seeing her at [facility]  Review of Systems  Constitutional: Negative for fever, chills, diaphoresis, activity change, appetite change, fatigue and unexpected weight change.  HENT: Negative for congestion, rhinorrhea, sinus pressure, sneezing, sore throat and trouble swallowing.   Eyes: Negative for photophobia and visual disturbance.  Respiratory: Negative for cough, chest tightness, shortness of breath, wheezing and stridor.   Cardiovascular: Negative for chest pain, palpitations and leg swelling.  Gastrointestinal: Negative for nausea, vomiting, abdominal pain, diarrhea, constipation, blood in stool, abdominal distention and anal bleeding.  Genitourinary: Negative for dysuria, hematuria, flank pain and difficulty urinating.  Musculoskeletal: Negative for arthralgias, back pain, gait problem, joint swelling and myalgias.  Skin: Positive for wound. Negative for color change, pallor and rash.  Neurological: Negative for dizziness, tremors, weakness and light-headedness.  Hematological: Negative for adenopathy. Does not bruise/bleed easily.  Psychiatric/Behavioral: Negative for behavioral problems, confusion, sleep disturbance, dysphoric mood, decreased concentration and agitation.       Objective:   Physical Exam  Constitutional: She is oriented to person, place, and time. No distress.  HENT:  Head: Normocephalic and atraumatic.  Mouth/Throat: Oropharynx is clear and moist. No oropharyngeal  exudate.  Eyes: Conjunctivae and EOM are normal. Pupils are equal, round, and reactive to light. No scleral icterus.  Neck: Normal range of motion. Neck supple. No JVD present.  Cardiovascular: Normal rate, regular rhythm and normal heart sounds.  Exam reveals no gallop and no friction rub.   No murmur heard. Pulmonary/Chest: Effort normal and breath sounds normal. No respiratory distress. She has no wheezes. She has no rales. She exhibits no tenderness.  Abdominal: She exhibits no distension and no mass. There is no tenderness.  Musculoskeletal: She exhibits no edema and no tenderness.  Lymphadenopathy:    She has no cervical adenopathy.  Neurological: She is alert and oriented to person, place, and time. She has normal reflexes. She exhibits normal muscle tone. Coordination normal.  Skin: Skin is warm. She is not diaphoretic. No erythema. No pallor.     Psychiatric: She has a normal mood and affect. Her behavior is normal. Judgment and thought content normal.          Assessment & Plan:   HIV: perfectly controlled, recheck labs HIV VL and CD4 today, check hep panel. I spent greater than 25 minutes with the patient including greater than 50% of time in face to face counsel of the patient and in coordination of their care.   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  Hypotension with HD: on midodrine.   ESRD on HD: getting HD via graft in leg  Need for flu vaccine: given

## 2013-01-18 ENCOUNTER — Other Ambulatory Visit: Payer: Self-pay | Admitting: Infectious Disease

## 2013-01-18 ENCOUNTER — Telehealth: Payer: Self-pay | Admitting: Infectious Disease

## 2013-01-18 DIAGNOSIS — B20 Human immunodeficiency virus [HIV] disease: Secondary | ICD-10-CM

## 2013-01-18 LAB — T-HELPER CELL (CD4) - (RCID CLINIC ONLY)
CD4 % Helper T Cell: 15 % — ABNORMAL LOW (ref 33–55)
CD4 T Cell Abs: 210 /uL — ABNORMAL LOW (ref 400–2700)

## 2013-01-18 LAB — HIV-1 RNA QUANT-NO REFLEX-BLD
HIV 1 RNA Quant: 30700 copies/mL — ABNORMAL HIGH (ref <20)
HIV-1 RNA Quant, Log: 4.49 {Log} — ABNORMAL HIGH (ref <1.30)

## 2013-01-18 NOTE — Telephone Encounter (Signed)
Dr Jonn Shingles  There seems to be something HORRIBLY wrong with [name]. Is she NOT taking the antivirals that we have been prescribing her? Or do you not perhaps have a correct list of the meds she is supposed to be taking.   IF she is taking her regimen correctly there is NO way she should have uncontrolled viremia with VL of >30k?  Can you fax or send note in epic detailing meds she is taking and can you find out if she has been refusing them?  I am going to need to start seeing her on monthly basis until this has resolved and will bring her back to clinic next week fi that can be arranged. I will cc my CMA and clinic manager to arrange for this

## 2013-01-18 NOTE — Telephone Encounter (Signed)
Dr. Tommy Medal:  [name] is a nursing home resident & I only see her on a prn basis per long term care guidelines.  The information you are asking need to be requested from her nurse at the facility [facility]) .  The phone number is [phone].  Please have your cma/nurse call the facility & speak to [name]'s nurse or unit supervisor & request the information.  Thanks.  Ezequiel Kayser

## 2013-01-19 ENCOUNTER — Non-Acute Institutional Stay (SKILLED_NURSING_FACILITY): Payer: Medicaid Other | Admitting: Family

## 2013-01-19 ENCOUNTER — Encounter: Payer: Self-pay | Admitting: Family

## 2013-01-19 DIAGNOSIS — Z992 Dependence on renal dialysis: Principal | ICD-10-CM

## 2013-01-19 DIAGNOSIS — B20 Human immunodeficiency virus [HIV] disease: Secondary | ICD-10-CM

## 2013-01-19 DIAGNOSIS — I959 Hypotension, unspecified: Secondary | ICD-10-CM

## 2013-01-19 DIAGNOSIS — N186 End stage renal disease: Secondary | ICD-10-CM

## 2013-01-19 NOTE — Telephone Encounter (Signed)
Thanks Gayani, I greatly appreciate it!

## 2013-01-19 NOTE — Progress Notes (Signed)
Patient ID: [name], female   DOB: Jun 02, 1955, 58 y.o.   MRN: [id number]  Date: 01/19/13 Facility: [facility]  Code Status:  DNR  Chief Complaint  Patient presents with  . Medical Managment of Chronic Issues    Routine Visit    HPI: Pt is being followed for the medical management of chronic illnesses. Pt and health care team denies concerns/issues at present.       Allergies  Allergen Reactions  . Penicillins Nausea And Vomiting and Rash     Medication List       This list is accurate as of: 01/19/13  2:59 PM.  Always use your most recent med list.               abacavir 300 MG tablet  Commonly known as:  ZIAGEN  Take 600 mg by mouth daily at 12 noon.     aspirin 81 MG chewable tablet  Chew 81 mg by mouth daily at 12 noon.     b complex vitamins tablet  Take 1 tablet by mouth daily at 12 noon.     calcium acetate 667 MG capsule  Commonly known as:  PHOSLO  Take 2 capsules (1,334 mg total) by mouth 3 (three) times daily with meals.     CERTAGEN PO  Take 1 tablet by mouth daily at 12 noon.     Darunavir Ethanolate 800 MG tablet  Commonly known as:  PREZISTA  Take 1 tablet (800 mg total) by mouth daily with breakfast.     ethyl chloride spray  Apply 1 application topically 3 (three) times a week. To graft for dialysis Tuesday, Thursday and Saturday     guaifenesin 100 MG/5ML syrup  Commonly known as:  ROBITUSSIN  Take 200 mg by mouth every 6 (six) hours as needed for cough or congestion.     lamiVUDine 10 MG/ML solution  Commonly known as:  EPIVIR  Take 50 mg by mouth daily at 12 noon.     midodrine 10 MG tablet  Commonly known as:  PROAMATINE  Take 10 mg by mouth 2 (two) times daily. At 630am and 10pm     oxyCODONE 5 MG immediate release tablet  Commonly known as:  Oxy IR/ROXICODONE  Take 1 tablet (5 mg total) by mouth every 6 (six) hours as needed (for pain).     ritonavir 100 MG Tabs tablet  Commonly known as:  NORVIR  Take 1 tablet  (100 mg total) by mouth daily with breakfast.         DATA REVIEWED  Laboratory Studies: 11/18/12-WBC 3.4, Hemoglobin 10.3, Hematocrit 31.4, Platelets 136, Na 135, K 4.2, Cl 96, BUN 30, Creatinine 7.66 (ESRD-on dialysis), Ca 10.1, Magnesium 2.3     Past Medical History  Diagnosis Date  . Human immunodeficiency virus (HIV) disease   . Hypertension   . Clostridium difficile infection 04/04/10    07/2011 and 08/2011  . Bacteriuria, asymptomatic 04/04/10    Culture grew VRE sensitive to linesolid   . MGUS (monoclonal gammopathy of unknown significance)   . History of bacteremia     MSSA; 05/2011, 08/2011  . Anemia   . Systolic heart failure     08/27/11 EF 35-40%  . Cancer   . ESRD (end stage renal disease) 03/19/2007    Started HD in 2010.  ESRD was due to HIV per pt. Gets HD on [employer] at [facility] on TTS schedule.     . Peripheral  vascular disease   . Endocarditis     MSSA  . 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 Clostridium difficile colitis   . Septicemia due to enterococcus      Past Surgical History  Procedure Laterality Date  . Av fistula placement    . Insertion of dialysis catheter  12/30/2010    Procedure: INSERTION OF DIALYSIS CATHETER;  Surgeon: Elam Dutch, MD;  Location: [facility];  Service: Vascular;  Laterality: Left;  Insertion of left internal jugular dialysis catheter  . Vascular surgery    . Insertion of dialysis catheter  05/26/2011    Procedure: INSERTION OF DIALYSIS CATHETER;  Surgeon: Conrad Magee, MD;  Location: [place];  Service: Vascular;  Laterality: N/A;  Insertion tunneled dialysis catheter in Right Internal Jugular with 23 cm catheter   . Insertion of dialysis catheter  08/26/2011    Procedure: INSERTION OF DIALYSIS CATHETER;  Surgeon: Elam Dutch, MD;  Location: [facility] OR;  Service: Vascular;  Laterality: Right;  insertion of Right Femoral vein Dialysis catheter  . Tee without cardioversion  08/27/2011     Procedure: TRANSESOPHAGEAL ECHOCARDIOGRAM (TEE);  Surgeon: Pixie Casino, MD;  Location: [place];  Service: Cardiovascular;  Laterality: N/A;  . Av fistula placement  09/01/2011    Procedure: INSERTION OF ARTERIOVENOUS (AV) GORE-TEX GRAFT ARM;  Surgeon: Rosetta Posner, MD;  Location: [facility] OR;  Service: Vascular;  Laterality: Right;  Using 54mm x 50  stretch goretex graft  . Thrombectomy and revision of arterioventous (av) goretex  graft Right 02/13/2012    Procedure: THROMBECTOMY AND REVISION OF ARTERIOVENTOUS (AV) GORETEX  GRAFT;  Surgeon: Angelia Mould, MD;  Location: [place];  Service: Vascular;  Laterality: Right;  . Tee without cardioversion N/A 02/16/2012    Procedure: TRANSESOPHAGEAL ECHOCARDIOGRAM (TEE);  Surgeon: Fay Records, MD;  Location: [place];  Service: Cardiovascular;  Laterality: N/A;  Rm [id number]  . Avgg removal Right 03/29/2012    Procedure: REMOVAL OF ARTERIOVENOUS GORETEX GRAFT ([place]);  Surgeon: Rosetta Posner, MD;  Location: [place];  Service: Vascular;  Laterality: Right;  . Insertion of dialysis catheter N/A 03/29/2012    Procedure: INSERTION OF DIALYSIS CATHETER;  Surgeon: Rosetta Posner, MD;  Location: [place];  Service: Vascular;  Laterality: N/A;  Ultrasound guided  . Av fistula placement Left 05/03/2012    Procedure: INSERTION OF ARTERIOVENOUS (AV) GORE-TEX GRAFT THIGH USING 64mm X 70cm Gore-Tex Graft ;  Surgeon: Rosetta Posner, MD;  Location: [facility] OR;  Service: Vascular;  Laterality: Left;  . Transthoracic echocardiogram  08/27/2011    EF 35-40%, mild-moderate concetric hypertrophy of the LV, moderately reduced LV systolic function,    Review of Systems  Constitutional: Negative.   HENT: Negative.   Eyes: Negative.   Respiratory: Negative.   Cardiovascular: Negative.   Gastrointestinal: Negative.   Genitourinary:       Dialysis T-R-S access L thigh  Skin: Negative.        Denies itching  Neurological: Negative.   Psychiatric/Behavioral: Negative.      Physical  Exam Filed Vitals:   01/19/13 1454  BP: 110/60  Pulse: 68  Temp: 97.8 F (36.6 C)  Resp: 16   There is no weight on file to calculate BMI. Physical Exam  Constitutional: She is oriented to person, place, and time.  Cardiovascular: Normal rate and regular rhythm.   Pulmonary/Chest: Effort normal and breath sounds normal.  Neurological: She is alert and oriented to person, place, and time.  Skin: Skin is warm and dry.       ASSESSMENT/PLAN  ESRD- will continue dialysis; collaborate with nephrology for labs; Hypotension- will continue midodrine; pt is stable HIV-pt is being managed by ID-recommendations to RTC in 3 months; pt is stable   Follow up:prn  Time Spent: 50 minutes

## 2013-01-20 NOTE — Telephone Encounter (Signed)
[place] and spoke with the nursing supervisor and was able to explain what Dr Tommy Medal needs. She is faxing me a curent medicatin list which I will forward to [facility] once I get and I scheduled her an appt to see him 01/26/13 as the patient does dialysis on Tuesday.Thursday.  Also added the labs per Dr Tommy Medal request.

## 2013-01-20 NOTE — Addendum Note (Signed)
Addended by: Dolan Amen D on: 01/20/2013 02:38 PM   Modules accepted: Orders

## 2013-01-21 ENCOUNTER — Encounter: Payer: Self-pay | Admitting: *Deleted

## 2013-01-26 ENCOUNTER — Ambulatory Visit: Payer: Self-pay | Admitting: Infectious Disease

## 2013-01-27 ENCOUNTER — Telehealth: Payer: Self-pay | Admitting: Licensed Clinical Social Worker

## 2013-01-27 NOTE — Telephone Encounter (Signed)
I spoke to this patient's sister and she was unaware that [facility] was not transporting the patient to her appointments. I hope this will help. We also have talked to the nursing supervisor at [facility]

## 2013-01-29 LAB — HIV-1 INTEGRASE GENOTYPE

## 2013-01-31 LAB — HIV-1 GENOTYPR PLUS

## 2013-02-04 ENCOUNTER — Encounter: Payer: Self-pay | Admitting: Internal Medicine

## 2013-03-29 ENCOUNTER — Other Ambulatory Visit (HOSPITAL_COMMUNITY): Payer: Self-pay | Admitting: Diagnostic Radiology

## 2013-03-29 DIAGNOSIS — Z1231 Encounter for screening mammogram for malignant neoplasm of breast: Secondary | ICD-10-CM

## 2013-04-13 ENCOUNTER — Encounter: Payer: Self-pay | Admitting: Infectious Disease

## 2013-04-13 ENCOUNTER — Ambulatory Visit (INDEPENDENT_AMBULATORY_CARE_PROVIDER_SITE_OTHER): Payer: Medicaid Other | Admitting: Infectious Disease

## 2013-04-13 VITALS — BP 131/74 | HR 67 | Temp 99.1°F | Ht 67.0 in | Wt 196.0 lb

## 2013-04-13 DIAGNOSIS — Z23 Encounter for immunization: Secondary | ICD-10-CM

## 2013-04-13 DIAGNOSIS — N186 End stage renal disease: Secondary | ICD-10-CM

## 2013-04-13 DIAGNOSIS — Z9119 Patient's noncompliance with other medical treatment and regimen: Secondary | ICD-10-CM

## 2013-04-13 DIAGNOSIS — Z992 Dependence on renal dialysis: Secondary | ICD-10-CM

## 2013-04-13 DIAGNOSIS — B20 Human immunodeficiency virus [HIV] disease: Secondary | ICD-10-CM

## 2013-04-13 DIAGNOSIS — R7401 Elevation of levels of liver transaminase levels: Secondary | ICD-10-CM

## 2013-04-13 DIAGNOSIS — A4902 Methicillin resistant Staphylococcus aureus infection, unspecified site: Secondary | ICD-10-CM

## 2013-04-13 DIAGNOSIS — E785 Hyperlipidemia, unspecified: Secondary | ICD-10-CM

## 2013-04-13 DIAGNOSIS — R7881 Bacteremia: Secondary | ICD-10-CM

## 2013-04-13 DIAGNOSIS — I10 Essential (primary) hypertension: Secondary | ICD-10-CM

## 2013-04-13 DIAGNOSIS — R740 Nonspecific elevation of levels of transaminase and lactic acid dehydrogenase [LDH]: Secondary | ICD-10-CM

## 2013-04-13 DIAGNOSIS — Z91199 Patient's noncompliance with other medical treatment and regimen due to unspecified reason: Secondary | ICD-10-CM

## 2013-04-13 LAB — HEPATIC FUNCTION PANEL
ALT: 46 U/L — ABNORMAL HIGH (ref 0–35)
AST: 80 U/L — ABNORMAL HIGH (ref 0–37)
Albumin: 3.2 g/dL — ABNORMAL LOW (ref 3.5–5.2)
Alkaline Phosphatase: 142 U/L — ABNORMAL HIGH (ref 39–117)
Bilirubin, Direct: 0.3 mg/dL (ref 0.0–0.3)
Indirect Bilirubin: 0.5 mg/dL (ref 0.2–1.2)
Total Bilirubin: 0.8 mg/dL (ref 0.2–1.2)
Total Protein: 8.7 g/dL — ABNORMAL HIGH (ref 6.0–8.3)

## 2013-04-13 LAB — RPR

## 2013-04-13 LAB — LDL CHOLESTEROL, DIRECT: Direct LDL: 96 mg/dL

## 2013-04-13 NOTE — Addendum Note (Signed)
Addended by: Landis Gandy on: 04/13/2013 12:06 PM   Modules accepted: Orders

## 2013-04-13 NOTE — Progress Notes (Signed)
Subjective:    Patient ID: [name], female    DOB: 07/23/1955, 58 y.o.   MRN: [id number]  HPI   58 year old [name] with HIV currently perfectly controlled and ESRD on HD residing in [facility].   She is doing well with no fevers, chills or malaise. She has no pain in any joints and no back pain. She denies diarrhea.   She is claiming NOW to be adherent.  SHE HAD COMPLETELY OUT OF CONTROL VIREMIA WITH VL > THIRTH THOUSAND ALLEGEDLY TAKING MEDS BUT CLEARLY NOT TAKING HER MEDS AS SHE HAD ZERO RESISTANCE MUTATIONS AT THAT TIME IN January.   WE CALLED SNF RN DIRECTOR AND HOPEFULY WE HAVE SOLVED THIS THOUGH MY CONFIDENCE IN [place] HAS TEMPORARILY BEEN SHAKEN  She looks well today and recognizes all of the meds from her regimen   Review of Systems  Constitutional: Negative for fever, chills, diaphoresis, activity change, appetite change, fatigue and unexpected weight change.  HENT: Negative for congestion, rhinorrhea, sinus pressure, sneezing, sore throat and trouble swallowing.   Eyes: Negative for photophobia and visual disturbance.  Respiratory: Negative for cough, chest tightness, shortness of breath, wheezing and stridor.   Cardiovascular: Negative for chest pain, palpitations and leg swelling.  Gastrointestinal: Negative for nausea, vomiting, abdominal pain, diarrhea, constipation, blood in stool, abdominal distention and anal bleeding.  Genitourinary: Negative for dysuria, hematuria, flank pain and difficulty urinating.  Musculoskeletal: Negative for arthralgias, back pain, gait problem, joint swelling and myalgias.  Skin: Positive for wound. Negative for color change, pallor and rash.  Neurological: Negative for dizziness, tremors, weakness and light-headedness.  Hematological: Negative for adenopathy. Does not bruise/bleed easily.  Psychiatric/Behavioral: Negative for behavioral problems, confusion, sleep disturbance, dysphoric mood, decreased  concentration and agitation.       Objective:   Physical Exam  Constitutional: She is oriented to person, place, and time. No distress.  HENT:  Head: Normocephalic and atraumatic.  Mouth/Throat: Oropharynx is clear and moist. No oropharyngeal exudate.  Eyes: Conjunctivae and EOM are normal. Pupils are equal, round, and reactive to light. No scleral icterus.  Neck: Normal range of motion. Neck supple. No JVD present.  Cardiovascular: Normal rate, regular rhythm and normal heart sounds.  Exam reveals no gallop and no friction rub.   No murmur heard. Pulmonary/Chest: Effort normal and breath sounds normal. No respiratory distress. She has no wheezes. She has no rales. She exhibits no tenderness.  Abdominal: She exhibits no distension and no mass. There is no tenderness.  Musculoskeletal: She exhibits no edema and no tenderness.  Lymphadenopathy:    She has no cervical adenopathy.  Neurological: She is alert and oriented to person, place, and time. She has normal reflexes. She exhibits normal muscle tone. Coordination normal.  Skin: Skin is warm. She is not diaphoretic. No erythema. No pallor.     Psychiatric: She has a normal mood and affect. Her behavior is normal. Judgment and thought content normal.          Assessment & Plan:   HIV: COMPLETELY OUT OF CONTROL WHICH IS UNBELIEVABLE IN A CONTROLLED SETTING OF A SNF  IS SHE REFUSING MEDS, WAS SNF NOT GIVING MEDS TO HER  SHE IS ON CAPSULE OF NORVIR (WHICH WHILE CHEAPER NEEDS REFRIDGERATION ) THOUGH THAT ALONE WOULD NOT EXPLAIN PROBLEMS  CHECK VL AND CD4 TODAY  IF NOT CONTROLLED AND WITH RESISTANCE WILL DEAL WITH THAT IF NOT CONTROLLED AND WITHOUT RESISTANCE MORE INTENSE COMMUNICATION WITH SNF  AND I WILL ALSO LIKELY ADD AN INGRASE INHIBITOR TO PROTECT AGAINST MUCH ROBUST VIRAL REBOUND  I spent greater than 40 minutes with the patient including greater than 50% of time in face to face counsel of the patient and in coordination of  their care.   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  Hypotension with HD: on midodrine.   ESRD on HD: getting HD via graft in leg  LFT up at last visit: will recheck  Hyperlipidemia: check direct LDL

## 2013-04-15 ENCOUNTER — Telehealth: Payer: Self-pay | Admitting: Infectious Disease

## 2013-04-15 ENCOUNTER — Other Ambulatory Visit: Payer: Self-pay | Admitting: *Deleted

## 2013-04-15 DIAGNOSIS — B20 Human immunodeficiency virus [HIV] disease: Secondary | ICD-10-CM

## 2013-04-15 LAB — HIV-1 RNA ULTRAQUANT REFLEX TO GENTYP+
HIV 1 RNA Quant: 3008 copies/mL — ABNORMAL HIGH (ref <20)
HIV-1 RNA Quant, Log: 3.48 {Log} — ABNORMAL HIGH (ref <1.30)

## 2013-04-15 MED ORDER — TENOFOVIR DISOPROXIL FUMARATE 300 MG PO TABS: 300.0000 mg | ORAL_TABLET | ORAL | Status: DC

## 2013-04-15 MED ORDER — RALTEGRAVIR POTASSIUM 400 MG PO TABS: 400.0000 mg | ORAL_TABLET | Freq: Two times a day (BID) | ORAL | Status: DC

## 2013-04-15 NOTE — Telephone Encounter (Signed)
Pts VL still 3K still clearly has NOT been reliably taking her medicines in the SNF  I am adding Isentresss 4000mg  bid  And   [place]  Please make sure she takes this along with the rest of her HIV medicines EVERY SINGLE DAY   [facility] RN staff if we cannot find an answer please ask our bridge counselor Mitch to go to the SNF to figure out what is going on and trouble shoot

## 2013-04-15 NOTE — Telephone Encounter (Signed)
She must not be taking them when she goes home for the weekend  Are they refridgerating that crappy norvir capsule they have been using?  The tablet is stable at room temp but not the capsule

## 2013-04-15 NOTE — Telephone Encounter (Signed)
  Spoke with Caryl Pina, patient nurse at [facility] and informed her of change in regimen. Their driver will pick up new meds at [facility] today. Verified that patient had been receiving Ziagen, Prezista, Epivir, and Norvir; and per nurse she takes her meds every day. Patient does go home for the weekend about every 3 weeks and meds are sent home with her. Patient has not been refusing meds while at the facility. Myrtis Hopping

## 2013-04-18 NOTE — Telephone Encounter (Signed)
Patient's last Rx for Norvir was the tablets. [name]

## 2013-04-19 ENCOUNTER — Encounter (HOSPITAL_COMMUNITY): Payer: Self-pay | Admitting: Emergency Medicine

## 2013-04-19 ENCOUNTER — Inpatient Hospital Stay (HOSPITAL_COMMUNITY)
Admission: EM | Admit: 2013-04-19 | Discharge: 2013-04-21 | DRG: 974 | Disposition: A | Discharge status: Skilled Nursing Facility | Payer: Medicaid Other | Attending: Internal Medicine | Admitting: Internal Medicine

## 2013-04-19 ENCOUNTER — Emergency Department (HOSPITAL_COMMUNITY): Payer: Medicaid Other

## 2013-04-19 ENCOUNTER — Non-Acute Institutional Stay (SKILLED_NURSING_FACILITY): Payer: Medicaid Other | Admitting: Adult Health

## 2013-04-19 DIAGNOSIS — I428 Other cardiomyopathies: Secondary | ICD-10-CM | POA: Diagnosis present

## 2013-04-19 DIAGNOSIS — Z7982 Long term (current) use of aspirin: Secondary | ICD-10-CM

## 2013-04-19 DIAGNOSIS — Z79899 Other long term (current) drug therapy: Secondary | ICD-10-CM

## 2013-04-19 DIAGNOSIS — Z880 Allergy status to penicillin: Secondary | ICD-10-CM

## 2013-04-19 DIAGNOSIS — K811 Chronic cholecystitis: Secondary | ICD-10-CM | POA: Diagnosis present

## 2013-04-19 DIAGNOSIS — I5022 Chronic systolic (congestive) heart failure: Secondary | ICD-10-CM | POA: Diagnosis present

## 2013-04-19 DIAGNOSIS — J189 Pneumonia, unspecified organism: Principal | ICD-10-CM | POA: Diagnosis present

## 2013-04-19 DIAGNOSIS — D709 Neutropenia, unspecified: Secondary | ICD-10-CM

## 2013-04-19 DIAGNOSIS — I951 Orthostatic hypotension: Secondary | ICD-10-CM | POA: Diagnosis present

## 2013-04-19 DIAGNOSIS — D638 Anemia in other chronic diseases classified elsewhere: Secondary | ICD-10-CM | POA: Diagnosis present

## 2013-04-19 DIAGNOSIS — I739 Peripheral vascular disease, unspecified: Secondary | ICD-10-CM | POA: Diagnosis present

## 2013-04-19 DIAGNOSIS — B20 Human immunodeficiency virus [HIV] disease: Secondary | ICD-10-CM

## 2013-04-19 DIAGNOSIS — I959 Hypotension, unspecified: Secondary | ICD-10-CM

## 2013-04-19 DIAGNOSIS — Z6830 Body mass index (BMI) 30.0-30.9, adult: Secondary | ICD-10-CM

## 2013-04-19 DIAGNOSIS — D61818 Other pancytopenia: Secondary | ICD-10-CM

## 2013-04-19 DIAGNOSIS — N186 End stage renal disease: Secondary | ICD-10-CM

## 2013-04-19 DIAGNOSIS — I120 Hypertensive chronic kidney disease with stage 5 chronic kidney disease or end stage renal disease: Secondary | ICD-10-CM | POA: Diagnosis present

## 2013-04-19 DIAGNOSIS — N2581 Secondary hyperparathyroidism of renal origin: Secondary | ICD-10-CM | POA: Diagnosis present

## 2013-04-19 DIAGNOSIS — Z992 Dependence on renal dialysis: Secondary | ICD-10-CM

## 2013-04-19 DIAGNOSIS — R5081 Fever presenting with conditions classified elsewhere: Secondary | ICD-10-CM

## 2013-04-19 LAB — CBC WITH DIFFERENTIAL/PLATELET
Basophils Absolute: 0 10*3/uL (ref 0.0–0.1)
Basophils Relative: 0 % (ref 0–1)
Eosinophils Absolute: 0.1 10*3/uL (ref 0.0–0.7)
Eosinophils Relative: 1 % (ref 0–5)
HCT: 27.7 % — ABNORMAL LOW (ref 36.0–46.0)
Hemoglobin: 9.2 g/dL — ABNORMAL LOW (ref 12.0–15.0)
Lymphocytes Relative: 39 % (ref 12–46)
Lymphs Abs: 2.3 10*3/uL (ref 0.7–4.0)
MCH: 34.7 pg — ABNORMAL HIGH (ref 26.0–34.0)
MCHC: 33.2 g/dL (ref 30.0–36.0)
MCV: 104.5 fL — ABNORMAL HIGH (ref 78.0–100.0)
Monocytes Absolute: 0.9 10*3/uL (ref 0.1–1.0)
Monocytes Relative: 15 % — ABNORMAL HIGH (ref 3–12)
Neutro Abs: 2.6 10*3/uL (ref 1.7–7.7)
Neutrophils Relative %: 45 % (ref 43–77)
Platelets: 144 10*3/uL — ABNORMAL LOW (ref 150–400)
RBC: 2.65 MIL/uL — ABNORMAL LOW (ref 3.87–5.11)
RDW: 13.3 % (ref 11.5–15.5)
WBC: 5.8 10*3/uL (ref 4.0–10.5)

## 2013-04-19 LAB — COMPREHENSIVE METABOLIC PANEL
ALT: 29 U/L (ref 0–35)
AST: 58 U/L — ABNORMAL HIGH (ref 0–37)
Albumin: 2.9 g/dL — ABNORMAL LOW (ref 3.5–5.2)
Alkaline Phosphatase: 133 U/L — ABNORMAL HIGH (ref 39–117)
BUN: 16 mg/dL (ref 6–23)
CO2: 26 mEq/L (ref 19–32)
Calcium: 9.4 mg/dL (ref 8.4–10.5)
Chloride: 97 mEq/L (ref 96–112)
Creatinine, Ser: 5.11 mg/dL — ABNORMAL HIGH (ref 0.50–1.10)
GFR calc Af Amer: 10 mL/min — ABNORMAL LOW (ref >90)
GFR calc non Af Amer: 9 mL/min — ABNORMAL LOW (ref >90)
Glucose, Bld: 86 mg/dL (ref 70–99)
Potassium: 4.1 mEq/L (ref 3.7–5.3)
Sodium: 137 mEq/L (ref 137–147)
Total Bilirubin: 0.7 mg/dL (ref 0.3–1.2)
Total Protein: 9 g/dL — ABNORMAL HIGH (ref 6.0–8.3)

## 2013-04-19 LAB — I-STAT CG4 LACTIC ACID, ED: Lactic Acid, Venous: 0.87 mmol/L (ref 0.5–2.2)

## 2013-04-19 MED ORDER — VANCOMYCIN HCL IN DEXTROSE 1-5 GM/200ML-% IV SOLN
1000.0000 mg | Freq: Once | INTRAVENOUS | Status: AC
  Administered 2013-04-20: 1000 mg via INTRAVENOUS
  Filled 2013-04-19: qty 200

## 2013-04-19 MED ORDER — SODIUM CHLORIDE 0.9 % IV BOLUS (SEPSIS)
500.0000 mL | Freq: Once | INTRAVENOUS | Status: AC
  Administered 2013-04-19: 500 mL via INTRAVENOUS

## 2013-04-19 MED ORDER — DEXTROSE 5 % IV SOLN
2.0000 g | Freq: Once | INTRAVENOUS | Status: AC
  Administered 2013-04-19: 2 g via INTRAVENOUS

## 2013-04-19 MED ORDER — SODIUM CHLORIDE 0.9 % IV BOLUS (SEPSIS): 500.0000 mL | Freq: Once | INTRAVENOUS | Status: DC

## 2013-04-19 MED ORDER — PIPERACILLIN-TAZOBACTAM 3.375 G IVPB 30 MIN: 3.3750 g | Freq: Once | INTRAVENOUS | Status: DC

## 2013-04-19 NOTE — ED Notes (Signed)
Patient transported to X-ray 

## 2013-04-19 NOTE — ED Notes (Signed)
Patient has returned from X-ray.

## 2013-04-19 NOTE — ED Notes (Signed)
Reviewed vitals with Dr. Lita Mains, patent's blood pressure tends to run low.  Will continue to monitor.  MD will plan to order blood cultures and antibiotics.

## 2013-04-19 NOTE — ED Notes (Signed)
Dr. Yelverton at the bedside.  

## 2013-04-19 NOTE — ED Notes (Addendum)
Per EMS: pt from [facility]. Pt has hx of pnuemonia and was complaining of SOB this afternoon. Pt was not out doing her normal activities according to staff (gossiping and walking outside of her room) but was in her room all day which concerned staff. Staff checked pt's temp and it was 101.8 gave her 640mg  of tylenol. Pt was also 88% on RA at facitlity. Pt has wheezing throughout with EMS and was started on a 5mg  albuterol treatment. Pt did not finish treatment was feeling better, but developed wheezing through out with EMS. Pt was 98% ra after breathing treatment. Pt feels like she needs to cough but cannot cough anything up. Pt has other significant medical history as well. Pt denies pain. Alert and oriented.

## 2013-04-19 NOTE — ED Provider Notes (Signed)
CSN: [id number]     Arrival date & time 04/19/13  2226 History   First MD Initiated Contact with Patient 04/19/13 2302     Chief Complaint  Patient presents with  . Shortness of Breath     (Consider location/radiation/quality/duration/timing/severity/associated sxs/prior Treatment) HPI Patient has a history of renal disease on hemodialysis Tuesday, Thursday and Saturday and poorly-controlled HIV with last viral load greater than 30,000 and CD4 count around 200. She was hemodialyzed this morning and has had progressive shortness of breath since. Her shortness of breath is worsened with any exertion. She's had mild nonproductive cough. She denies any lower extremity swelling or pain. She denies any chest pain. She had mild central abdominal discomfort earlier today but that has since resolved. Denies any nausea, vomiting or diarrhea. Past Medical History  Diagnosis Date  . Human immunodeficiency virus (HIV) disease   . Hypertension   . Clostridium difficile infection 04/04/10    07/2011 and 08/2011  . Bacteriuria, asymptomatic 04/04/10    Culture grew VRE sensitive to linesolid   . MGUS (monoclonal gammopathy of unknown significance)   . History of bacteremia     MSSA; 05/2011, 08/2011  . Anemia   . Systolic heart failure     08/27/11 EF 35-40%  . Cancer   . ESRD (end stage renal disease) 03/19/2007    Started HD in 2010.  ESRD was due to HIV per pt. Gets HD on [employer] at [facility] on TTS schedule.     . Peripheral vascular disease   . Endocarditis     MSSA  . 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 Clostridium difficile colitis   . Septicemia due to enterococcus    Past Surgical History  Procedure Laterality Date  . Av fistula placement    . Insertion of dialysis catheter  12/30/2010    Procedure: INSERTION OF DIALYSIS CATHETER;  Surgeon: Elam Dutch, MD;  Location: [place];  Service: Vascular;  Laterality: Left;  Insertion of left internal  jugular dialysis catheter  . Vascular surgery    . Insertion of dialysis catheter  05/26/2011    Procedure: INSERTION OF DIALYSIS CATHETER;  Surgeon: Conrad Potwin, MD;  Location: [place];  Service: Vascular;  Laterality: N/A;  Insertion tunneled dialysis catheter in Right Internal Jugular with 23 cm catheter   . Insertion of dialysis catheter  08/26/2011    Procedure: INSERTION OF DIALYSIS CATHETER;  Surgeon: Elam Dutch, MD;  Location: [facility] OR;  Service: Vascular;  Laterality: Right;  insertion of Right Femoral vein Dialysis catheter  . Tee without cardioversion  08/27/2011    Procedure: TRANSESOPHAGEAL ECHOCARDIOGRAM (TEE);  Surgeon: Pixie Casino, MD;  Location: [place];  Service: Cardiovascular;  Laterality: N/A;  . Av fistula placement  09/01/2011    Procedure: INSERTION OF ARTERIOVENOUS (AV) GORE-TEX GRAFT ARM;  Surgeon: Rosetta Posner, MD;  Location: [facility] OR;  Service: Vascular;  Laterality: Right;  Using 13mm x 50  stretch goretex graft  . Thrombectomy and revision of arterioventous (av) goretex  graft Right 02/13/2012    Procedure: THROMBECTOMY AND REVISION OF ARTERIOVENTOUS (AV) GORETEX  GRAFT;  Surgeon: Angelia Mould, MD;  Location: [place];  Service: Vascular;  Laterality: Right;  . Tee without cardioversion N/A 02/16/2012    Procedure: TRANSESOPHAGEAL ECHOCARDIOGRAM (TEE);  Surgeon: Fay Records, MD;  Location: [place];  Service: Cardiovascular;  Laterality: N/A;  Rm 6710  .  Avgg removal Right 03/29/2012    Procedure: REMOVAL OF ARTERIOVENOUS GORETEX GRAFT ([place]);  Surgeon: Rosetta Posner, MD;  Location: [place];  Service: Vascular;  Laterality: Right;  . Insertion of dialysis catheter N/A 03/29/2012    Procedure: INSERTION OF DIALYSIS CATHETER;  Surgeon: Rosetta Posner, MD;  Location: [place];  Service: Vascular;  Laterality: N/A;  Ultrasound guided  . Av fistula placement Left 05/03/2012    Procedure: INSERTION OF ARTERIOVENOUS (AV) GORE-TEX GRAFT THIGH USING 57mm X 70cm Gore-Tex Graft  ;  Surgeon: Rosetta Posner, MD;  Location[facility] OR;  Service: Vascular;  Laterality: Left;  . Transthoracic echocardiogram  08/27/2011    EF 35-40%, mild-moderate concetric hypertrophy of the LV, moderately reduced LV systolic function,   Family History  Problem Relation Age of Onset  . Alcohol abuse      family h/o addiction/alcoholism  . Diabetes Cousin     first degree relatives  . Kidney disease Mother   . Kidney disease Maternal Uncle   . Cancer Sister   . Heart disease Father    History  Substance Use Topics  . Smoking status: Never Smoker   . Smokeless tobacco: Never Used  . Alcohol Use: No   OB History   Grav Para Term Preterm Abortions TAB SAB Ect Mult Living                 Review of Systems  Constitutional: Positive for chills and fatigue. Negative for fever.  Respiratory: Positive for cough and shortness of breath. Negative for wheezing.   Cardiovascular: Negative for chest pain, palpitations and leg swelling.  Gastrointestinal: Negative for nausea, vomiting, abdominal pain and diarrhea.  Musculoskeletal: Negative for back pain, neck pain and neck stiffness.  Skin: Negative for rash and wound.  Neurological: Negative for dizziness, syncope, weakness, light-headedness, numbness and headaches.  All other systems reviewed and are negative.     Allergies  Penicillins  Home Medications   Prior to Admission medications   Medication Sig Start Date End Date Taking? Authorizing Provider  abacavir (ZIAGEN) 300 MG tablet Take 600 mg by mouth daily at 12 noon.    Yes Historical Provider, MD  aspirin 81 MG chewable tablet Chew 81 mg by mouth daily at 12 noon.    Yes Historical Provider, MD  b complex vitamins tablet Take 1 tablet by mouth daily at 12 noon.    Yes Historical Provider, MD  calcium acetate (PHOSLO) 667 MG capsule Take 2 capsules (1,334 mg total) by mouth 3 (three) times daily with meals. 02/17/12  Yes Jerene Pitch, MD  Darunavir Ethanolate (PREZISTA) 800 MG  tablet Take 1 tablet (800 mg total) by mouth daily with breakfast. 09/02/11  Yes Cresenciano Genre, MD  ethyl chloride spray Apply 1 application topically 3 (three) times a week. To graft for dialysis Tuesday, Thursday and Saturday   Yes Historical Provider, MD  guaifenesin (ROBITUSSIN) 100 MG/5ML syrup Take 200 mg by mouth every 6 (six) hours as needed for cough or congestion.   Yes Historical Provider, MD  lamiVUDine (EPIVIR) 10 MG/ML solution Take 50 mg by mouth daily at 12 noon.   Yes Historical Provider, MD  midodrine (PROAMATINE) 10 MG tablet Take 10 mg by mouth 2 (two) times daily. At 630am and 10pm   Yes Historical Provider, MD  Multiple Vitamins-Minerals (CERTAGEN PO) Take 1 tablet by mouth daily at 12 noon.   Yes Historical Provider, MD  ritonavir (NORVIR) 100 MG TABS Take 1 tablet (  100 mg total) by mouth daily with breakfast. 09/02/11  Yes Cresenciano Genre, MD  raltegravir (ISENTRESS) 400 MG tablet Take 1 tablet (400 mg total) by mouth 2 (two) times daily. 04/15/13   Truman Hayward, MD  tenofovir (VIREAD) 300 MG tablet Take 1 tablet (300 mg total) by mouth once a week. 04/15/13   Truman Hayward, MD   BP 81/38  Pulse 92  Temp(Src) 101 F (38.3 C) (Oral)  Resp 22  SpO2 99% Physical Exam  Nursing note and vitals reviewed. Constitutional: She is oriented to person, place, and time. She appears well-developed and well-nourished. No distress.  HENT:  Head: Normocephalic and atraumatic.  Mouth/Throat: Oropharynx is clear and moist. No oropharyngeal exudate.  Eyes: EOM are normal. Pupils are equal, round, and reactive to light.  Neck: Normal range of motion. Neck supple.  Cardiovascular: Normal rate and regular rhythm.   Pulmonary/Chest: Effort normal. No respiratory distress. She has no wheezes. She has rales (Rales bilateral bases.). She exhibits no tenderness.  Patient does not appear to be in any respiratory distress  Abdominal: Soft. Bowel sounds are normal. She exhibits no  distension and no mass. There is no tenderness. There is no rebound and no guarding.  Musculoskeletal: Normal range of motion. She exhibits no edema and no tenderness.  Mild left leg swelling compared to right. Patient states is unchanged from baseline. Dialysis cath in left groin. No evidence of infection  Neurological: She is alert and oriented to person, place, and time.  Moves all extremities without deficit. Sensation is grossly intact.  Skin: Skin is warm and dry. No rash noted. No erythema.  Psychiatric: She has a normal mood and affect. Her behavior is normal.    ED Course  Procedures (including critical care time) Labs Review Labs Reviewed  CULTURE, BLOOD (ROUTINE X 2)  CULTURE, BLOOD (ROUTINE X 2)  CBC WITH DIFFERENTIAL  COMPREHENSIVE METABOLIC PANEL  PRO B NATRIURETIC PEPTIDE  I-STAT CG4 LACTIC ACID, ED    Imaging Review Dg Chest 2 View  04/19/2013   CLINICAL DATA:  Shortness of breath, cough, chest pain and wheezing.  EXAM: CHEST - 2 VIEW  COMPARISON:  CT CHEST W/CM dated 11/15/2012; DG CHEST 1V PORT dated 11/11/2012  FINDINGS: Lungs show diffuse bronchial thickening and normal volumes. It is suspected that there may be an early infiltrate in the right lower lobe. No edema or pleural fluid is identified. The heart size and mediastinal contours are within normal limits. There is stable mild tortuosity of the thoracic aorta. Bony structures are unremarkable.  IMPRESSION: Diffuse bronchial thickening with suspected early right lower lobe infiltrate.   Electronically Signed   By: Aletta Edouard M.D.   On: 04/19/2013 23:10     EKG Interpretation   Date/Time:  Tuesday April 19 2013 22:33:12 EDT Ventricular Rate:  96 PR Interval:  161 QRS Duration: 91 QT Interval:  459 QTC Calculation: 580 R Axis:   -46 Text Interpretation:  Sinus rhythm Probable left atrial enlargement Left  anterior fascicular block Abnormal R-wave progression, early transition  Borderline repolarization  abnormality Prolonged QT interval Artifact in  lead(s) II III aVR aVL aVF V1 V2 V3 V4 V5 V6 Confirmed by Lita Mains  MD,  Edelin Fryer ([id number]) on 04/19/2013 11:18:16 PM      MDM   Final diagnoses:  None    Discussed with Dr. Megan Salon. Recommends starting cefepime and vancomycin after blood cultures. I discussed with the internal medicine resident service and will  see patient in the emergency department and admit.  After review of her previous notes it appears that her systolic blood pressure runs in the 90s and low 100s.     Julianne Rice, MD 04/20/13 [phone]

## 2013-04-20 ENCOUNTER — Ambulatory Visit: Payer: Self-pay | Admitting: Infectious Disease

## 2013-04-20 DIAGNOSIS — J189 Pneumonia, unspecified organism: Principal | ICD-10-CM

## 2013-04-20 DIAGNOSIS — B20 Human immunodeficiency virus [HIV] disease: Secondary | ICD-10-CM

## 2013-04-20 DIAGNOSIS — I959 Hypotension, unspecified: Secondary | ICD-10-CM

## 2013-04-20 DIAGNOSIS — N186 End stage renal disease: Secondary | ICD-10-CM

## 2013-04-20 DIAGNOSIS — Z992 Dependence on renal dialysis: Secondary | ICD-10-CM

## 2013-04-20 LAB — CBC WITH DIFFERENTIAL/PLATELET
Basophils Absolute: 0 10*3/uL (ref 0.0–0.1)
Basophils Relative: 1 % (ref 0–1)
Eosinophils Absolute: 0 10*3/uL (ref 0.0–0.7)
Eosinophils Relative: 0 % (ref 0–5)
HCT: 27.1 % — ABNORMAL LOW (ref 36.0–46.0)
Hemoglobin: 9 g/dL — ABNORMAL LOW (ref 12.0–15.0)
Lymphocytes Relative: 33 % (ref 12–46)
Lymphs Abs: 1.3 10*3/uL (ref 0.7–4.0)
MCH: 35 pg — ABNORMAL HIGH (ref 26.0–34.0)
MCHC: 33.2 g/dL (ref 30.0–36.0)
MCV: 105.4 fL — ABNORMAL HIGH (ref 78.0–100.0)
Monocytes Absolute: 0.5 10*3/uL (ref 0.1–1.0)
Monocytes Relative: 13 % — ABNORMAL HIGH (ref 3–12)
Neutro Abs: 2.1 10*3/uL (ref 1.7–7.7)
Neutrophils Relative %: 53 % (ref 43–77)
Platelets: 124 10*3/uL — ABNORMAL LOW (ref 150–400)
RBC: 2.57 MIL/uL — ABNORMAL LOW (ref 3.87–5.11)
RDW: 13.5 % (ref 11.5–15.5)
WBC: 4 10*3/uL (ref 4.0–10.5)

## 2013-04-20 LAB — COMPREHENSIVE METABOLIC PANEL
ALT: 27 U/L (ref 0–35)
AST: 53 U/L — ABNORMAL HIGH (ref 0–37)
Albumin: 2.8 g/dL — ABNORMAL LOW (ref 3.5–5.2)
Alkaline Phosphatase: 124 U/L — ABNORMAL HIGH (ref 39–117)
BUN: 19 mg/dL (ref 6–23)
CO2: 25 mEq/L (ref 19–32)
Calcium: 9 mg/dL (ref 8.4–10.5)
Chloride: 98 mEq/L (ref 96–112)
Creatinine, Ser: 5.48 mg/dL — ABNORMAL HIGH (ref 0.50–1.10)
GFR calc Af Amer: 9 mL/min — ABNORMAL LOW (ref >90)
GFR calc non Af Amer: 8 mL/min — ABNORMAL LOW (ref >90)
Glucose, Bld: 79 mg/dL (ref 70–99)
Potassium: 3.7 mEq/L (ref 3.7–5.3)
Sodium: 137 mEq/L (ref 137–147)
Total Bilirubin: 0.7 mg/dL (ref 0.3–1.2)
Total Protein: 8.5 g/dL — ABNORMAL HIGH (ref 6.0–8.3)

## 2013-04-20 LAB — PRO B NATRIURETIC PEPTIDE: Pro B Natriuretic peptide (BNP): 39695 pg/mL — ABNORMAL HIGH (ref 0–125)

## 2013-04-20 MED ORDER — DEXTROSE 5 % IV SOLN: 1.0000 g | Freq: Three times a day (TID) | INTRAVENOUS | Status: DC

## 2013-04-20 MED ORDER — HEPARIN SODIUM (PORCINE) 5000 UNIT/ML IJ SOLN
5000.0000 [IU] | Freq: Three times a day (TID) | INTRAMUSCULAR | Status: DC
  Administered 2013-04-20 – 2013-04-21 (×4): 5000 [IU] via SUBCUTANEOUS
  Filled 2013-04-20 (×7): qty 1

## 2013-04-20 MED ORDER — LAMIVUDINE 10 MG/ML PO SOLN
50.0000 mg | Freq: Every day | ORAL | Status: DC
  Filled 2013-04-20 (×2): qty 5

## 2013-04-20 MED ORDER — GUAIFENESIN 100 MG/5ML PO SYRP
200.0000 mg | ORAL_SOLUTION | Freq: Four times a day (QID) | ORAL | Status: DC | PRN
  Filled 2013-04-20: qty 10

## 2013-04-20 MED ORDER — TENOFOVIR DISOPROXIL FUMARATE 300 MG PO TABS
300.0000 mg | ORAL_TABLET | ORAL | Status: DC
  Administered 2013-04-20: 300 mg via ORAL
  Filled 2013-04-20: qty 1

## 2013-04-20 MED ORDER — CALCIUM ACETATE 667 MG PO CAPS
1334.0000 mg | ORAL_CAPSULE | Freq: Three times a day (TID) | ORAL | Status: DC
  Administered 2013-04-20 – 2013-04-21 (×3): 1334 mg via ORAL
  Filled 2013-04-20 (×7): qty 2

## 2013-04-20 MED ORDER — B COMPLEX-C PO TABS
1.0000 | ORAL_TABLET | Freq: Every day | ORAL | Status: DC
  Administered 2013-04-20: 19:00:00 via ORAL
  Filled 2013-04-20 (×2): qty 1

## 2013-04-20 MED ORDER — ABACAVIR SULFATE 300 MG PO TABS
600.0000 mg | ORAL_TABLET | Freq: Every day | ORAL | Status: DC
  Administered 2013-04-20: 600 mg via ORAL
  Filled 2013-04-20 (×2): qty 2

## 2013-04-20 MED ORDER — DARBEPOETIN ALFA-POLYSORBATE 60 MCG/0.3ML IJ SOLN
60.0000 ug | INTRAMUSCULAR | Status: DC
  Administered 2013-04-21: 60 ug via INTRAVENOUS
  Filled 2013-04-20: qty 0.3

## 2013-04-20 MED ORDER — RALTEGRAVIR POTASSIUM 400 MG PO TABS
400.0000 mg | ORAL_TABLET | Freq: Two times a day (BID) | ORAL | Status: DC
  Administered 2013-04-20 – 2013-04-21 (×3): 400 mg via ORAL
  Filled 2013-04-20 (×4): qty 1

## 2013-04-20 MED ORDER — VANCOMYCIN HCL IN DEXTROSE 1-5 GM/200ML-% IV SOLN
1000.0000 mg | INTRAVENOUS | Status: DC
  Filled 2013-04-20: qty 200

## 2013-04-20 MED ORDER — VANCOMYCIN HCL IN DEXTROSE 1-5 GM/200ML-% IV SOLN
1000.0000 mg | Freq: Once | INTRAVENOUS | Status: AC
  Administered 2013-04-20: 1000 mg via INTRAVENOUS
  Filled 2013-04-20: qty 200

## 2013-04-20 MED ORDER — DARUNAVIR ETHANOLATE 800 MG PO TABS
800.0000 mg | ORAL_TABLET | Freq: Every day | ORAL | Status: DC
  Administered 2013-04-20 – 2013-04-21 (×2): 800 mg via ORAL
  Filled 2013-04-20 (×3): qty 1

## 2013-04-20 MED ORDER — ASPIRIN 81 MG PO CHEW: 81.0000 mg | CHEWABLE_TABLET | Freq: Every day | ORAL | Status: DC

## 2013-04-20 MED ORDER — DEXTROSE 5 % IV SOLN
2.0000 g | INTRAVENOUS | Status: DC
  Filled 2013-04-20: qty 2

## 2013-04-20 MED ORDER — RITONAVIR 100 MG PO TABS
100.0000 mg | ORAL_TABLET | Freq: Every day | ORAL | Status: DC
  Administered 2013-04-20 – 2013-04-21 (×2): 100 mg via ORAL
  Filled 2013-04-20 (×3): qty 1

## 2013-04-20 MED ORDER — MIDODRINE HCL 5 MG PO TABS
10.0000 mg | ORAL_TABLET | Freq: Two times a day (BID) | ORAL | Status: DC
  Administered 2013-04-20 – 2013-04-21 (×3): 10 mg via ORAL
  Filled 2013-04-20 (×5): qty 2

## 2013-04-20 MED ORDER — DOXERCALCIFEROL 4 MCG/2ML IV SOLN
5.0000 ug | INTRAVENOUS | Status: DC
  Administered 2013-04-21: 5 ug via INTRAVENOUS
  Filled 2013-04-20: qty 4

## 2013-04-20 NOTE — Progress Notes (Signed)
Utilization review completed. Shawnee Higham, RN, BSN. 

## 2013-04-20 NOTE — ED Notes (Signed)
Internal med MD at the bedside, ordered to stop boluses. Stopped IV NS. Patient received 239mL.

## 2013-04-20 NOTE — Progress Notes (Signed)
Subjective: Patient feels better this morning. She has persistent cough, but this has improved and is still nonproductive. Denies SOB at rest, has not been walking yet today. No chills.    Objective: Vital signs in last 24 hours: Filed Vitals:   04/20/13 0045 04/20/13 0100 04/20/13 0151 04/20/13 0410  BP: 88/46 76/45 104/63 111/72  Pulse: 79 79 77 81  Temp:   98.6 F (37 C) 98.4 F (36.9 C)  TempSrc:   Oral Oral  Resp: 19 15 19 20   Height:   5\' 7"  (1.702 m)   Weight:   192 lb 14.4 oz (87.5 kg)   SpO2: 99% 98% 97% 95%   Weight change:   Intake/Output Summary (Last 24 hours) at 04/20/13 0734 Last data filed at 04/20/13 0015  Gross per 24 hour  Intake    200 ml  Output      0 ml  Net    200 ml   Physical Exam General: alert, cooperative, and in no apparent distress; appears older than stated age HEENT: NCAT, vision grossly intact, oropharynx clear and non-erythematous w/ MMM Neck: supple, no lymphadenopathy, JVD, or carotid bruits Lungs: CTAB Heart: regular rate and rhythm Abdomen: soft, non-tender, non-distended, normal bowel sounds Extremities: trace to 1+ pitting edema to BLE, warm Neurologic: alert & oriented X3, cranial nerves II-XII grossly intact, moves all extremities spontaneously   Lab Results: Basic Metabolic Panel:  Recent Labs Lab 04/19/13 2236 04/20/13 0345  NA 137 137  K 4.1 3.7  CL 97 98  CO2 26 25  GLUCOSE 86 79  BUN 16 19  CREATININE 5.11* 5.48*  CALCIUM 9.4 9.0   Liver Function Tests:  Recent Labs Lab 04/19/13 2236 04/20/13 0345  AST 58* 53*  ALT 29 27  ALKPHOS 133* 124*  BILITOT 0.7 0.7  PROT 9.0* 8.5*  ALBUMIN 2.9* 2.8*   CBC:  Recent Labs Lab 04/19/13 2236 04/20/13 0345  WBC 5.8 4.0  NEUTROABS 2.6 2.1  HGB 9.2* 9.0*  HCT 27.7* 27.1*  MCV 104.5* 105.4*  PLT 144* 124*   BNP:  Recent Labs Lab 04/19/13 2236  PROBNP 39695.0*   Fasting Lipid Panel:  Recent Labs Lab 04/13/13 1001  LDLDIRECT 96   Micro  Results: No results found for this or any previous visit (from the past 240 hour(s)). Studies/Results: Dg Chest 2 View  04/19/2013   CLINICAL DATA:  Shortness of breath, cough, chest pain and wheezing.  EXAM: CHEST - 2 VIEW  COMPARISON:  CT CHEST W/CM dated 11/15/2012; DG CHEST 1V PORT dated 11/11/2012  FINDINGS: Lungs show diffuse bronchial thickening and normal volumes. It is suspected that there may be an early infiltrate in the right lower lobe. No edema or pleural fluid is identified. The heart size and mediastinal contours are within normal limits. There is stable mild tortuosity of the thoracic aorta. Bony structures are unremarkable.  IMPRESSION: Diffuse bronchial thickening with suspected early right lower lobe infiltrate.   Electronically Signed   By: Aletta Edouard M.D.   On: 04/19/2013 23:10   Medications: I have reviewed the patient's current medications. Scheduled Meds: . abacavir  600 mg Oral Q1200  . aspirin  81 mg Oral Q1200  . B-complex with vitamin C  1 tablet Oral Q1200  . calcium acetate  1,334 mg Oral TID WC  . [START ON 04/21/2013] ceFEPime (MAXIPIME) IV  2 g Intravenous Q T,Th,Sat-1800  . Darunavir Ethanolate  800 mg Oral Q breakfast  . heparin  5,000  Units Subcutaneous 3 times per day  . lamiVUDine  50 mg Oral Q1200  . midodrine  10 mg Oral BID WC  . raltegravir  400 mg Oral BID  . ritonavir  100 mg Oral Q breakfast  . tenofovir  300 mg Oral Q Wed  . [START ON 04/21/2013] vancomycin  1,000 mg Intravenous Q T,Th,Sa-HD   Continuous Infusions:  PRN Meds:.guaifenesin  Assessment/Plan:  HCAP- Pt presents with cough x 2 weeks found to be febrile to 101 with CXR showing early RLL infiltrate. Rest of vitals stable, though chronically has hypotension. No leukocytosis. ProBNP elevated on admission at [phone], though unreliable in setting of ESRD on HD.  -BCX x 2 pending -continue vanc/cefepime (pt is PCN allergic) -Urine legionella and strep antigen pending (may be difficult  to obtain given patient does not produce much urine)  HIV disease- VL on 04/13/2013 3008, CD4 count 01/17/2013- 210. Follows with Dr. Johnnye Sima. Patient is on Abacavir, Prezista, Lamivudine, Raltegravir, and ritonavir at home. - continue home regimen   End-stage renal disease on hemodialysis-HD on T/TH/S, last HD session Tuesday 4/14.  -nephrology consult - will need HD tomorrow  Hypotension- Chronic issue for patient, especially after hemodialysis. On midodrine 10 mg BID at home. -cotinue home midodrine.  -IVF w/ caution given low EF (35-40%)  Transaminitis: Stable. Perhaps ART therapy. Of note, prior CT scan did reveal chronic cholecystitis.  VTE ppx: heparin  Diet: renal  Code: full  Dispo: Disposition is deferred at this time, awaiting improvement of current medical problems.  Anticipated discharge in approximately 1-2 day(s).   The patient does have a current PCP Ivin Poot, MD) and does need an [facility] hospital follow-up appointment after discharge.  The patient does not have transportation limitations that hinder transportation to clinic appointments.  .Services Needed at time of discharge: Y = Yes, Blank = No PT:   OT:   RN:   Equipment:   Other:     LOS: 1 day   Rebecca Eaton, MD 04/20/2013, 7:34 AM

## 2013-04-20 NOTE — Consult Note (Signed)
[name] is an 58 y.o. female referred by Dr Marinda Elk   Chief Complaint: ESRD, anemia  Sec HPTH HPI: 58yo BF with ESRD admitted yest for Rt PNA.  Co fever and SOB.  Dialyzes at [facility] TTS, 4hr,BFR 400, DW 87kg, 2K/2.25Ca.  Past Medical History  Diagnosis Date  . Human immunodeficiency virus (HIV) disease   . Hypertension   . Clostridium difficile infection 04/04/10    07/2011 and 08/2011  . Bacteriuria, asymptomatic 04/04/10    Culture grew VRE sensitive to linesolid   . MGUS (monoclonal gammopathy of unknown significance)   . History of bacteremia     MSSA; 05/2011, 08/2011  . Anemia   . Systolic heart failure     08/27/11 EF 35-40%  . Cancer   . ESRD (end stage renal disease) 03/19/2007    Started HD in 2010.  ESRD was due to HIV per pt. Gets HD on [employer] at [facility] on TTS schedule.     . Peripheral vascular disease   . Endocarditis     MSSA  . 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 Clostridium difficile colitis   . Septicemia due to enterococcus     Past Surgical History  Procedure Laterality Date  . Av fistula placement    . Insertion of dialysis catheter  12/30/2010    Procedure: INSERTION OF DIALYSIS CATHETER;  Surgeon: Elam Dutch, MD;  Location: [place];  Service: Vascular;  Laterality: Left;  Insertion of left internal jugular dialysis catheter  . Vascular surgery    . Insertion of dialysis catheter  05/26/2011    Procedure: INSERTION OF DIALYSIS CATHETER;  Surgeon: Conrad Remy, MD;  Location: [place];  Service: Vascular;  Laterality: N/A;  Insertion tunneled dialysis catheter in Right Internal Jugular with 23 cm catheter   . Insertion of dialysis catheter  08/26/2011    Procedure: INSERTION OF DIALYSIS CATHETER;  Surgeon: Elam Dutch, MD;  Location: [facility] OR;  Service: Vascular;  Laterality: Right;  insertion of Right Femoral vein Dialysis catheter  . Tee without cardioversion  08/27/2011    Procedure: TRANSESOPHAGEAL  ECHOCARDIOGRAM (TEE);  Surgeon: Pixie Casino, MD;  Location: [place];  Service: Cardiovascular;  Laterality: N/A;  . Av fistula placement  09/01/2011    Procedure: INSERTION OF ARTERIOVENOUS (AV) GORE-TEX GRAFT ARM;  Surgeon: Rosetta Posner, MD;  Location: [facility] OR;  Service: Vascular;  Laterality: Right;  Using 22mm x 50  stretch goretex graft  . Thrombectomy and revision of arterioventous (av) goretex  graft Right 02/13/2012    Procedure: THROMBECTOMY AND REVISION OF ARTERIOVENTOUS (AV) GORETEX  GRAFT;  Surgeon: Angelia Mould, MD;  Location: [place];  Service: Vascular;  Laterality: Right;  . Tee without cardioversion N/A 02/16/2012    Procedure: TRANSESOPHAGEAL ECHOCARDIOGRAM (TEE);  Surgeon: Fay Records, MD;  Location: [place];  Service: Cardiovascular;  Laterality: N/A;  Rm 6710  . Avgg removal Right 03/29/2012    Procedure: REMOVAL OF ARTERIOVENOUS GORETEX GRAFT ([place]);  Surgeon: Rosetta Posner, MD;  Location: [place];  Service: Vascular;  Laterality: Right;  . Insertion of dialysis catheter N/A 03/29/2012    Procedure: INSERTION OF DIALYSIS CATHETER;  Surgeon: Rosetta Posner, MD;  Location: [place];  Service: Vascular;  Laterality: N/A;  Ultrasound guided  . Av fistula placement Left 05/03/2012    Procedure: INSERTION OF ARTERIOVENOUS (AV) GORE-TEX GRAFT THIGH USING 89mm X  70cm Gore-Tex Graft ;  Surgeon: Rosetta Posner, MD;  Location: [facility] OR;  Service: Vascular;  Laterality: Left;  . Transthoracic echocardiogram  08/27/2011    EF 35-40%, mild-moderate concetric hypertrophy of the LV, moderately reduced LV systolic function,    Family History  Problem Relation Age of Onset  . Alcohol abuse      family h/o addiction/alcoholism  . Diabetes Cousin     first degree relatives  . Kidney disease Mother   . Kidney disease Maternal Uncle   . Cancer Sister   . Heart disease Father    Social History:  reports that she has never smoked. She has never used smokeless tobacco. She reports that she uses  illicit drugs (Cocaine). She reports that she does not drink alcohol.  Allergies:  Allergies  Allergen Reactions  . Penicillins Nausea And Vomiting and Rash    Medications Prior to Admission  Medication Sig Dispense Refill  . abacavir (ZIAGEN) 300 MG tablet Take 600 mg by mouth daily at 12 noon.       [name] aspirin 81 MG chewable tablet Chew 81 mg by mouth daily at 12 noon.       [name] b complex vitamins tablet Take 1 tablet by mouth daily at 12 noon.       . calcium acetate (PHOSLO) 667 MG capsule Take 2 capsules (1,334 mg total) by mouth 3 (three) times daily with meals.      . Darunavir Ethanolate (PREZISTA) 800 MG tablet Take 1 tablet (800 mg total) by mouth daily with breakfast.  30 tablet  0  . ethyl chloride spray Apply 1 application topically 3 (three) times a week. To graft for dialysis Tuesday, Thursday and Saturday      . guaifenesin (ROBITUSSIN) 100 MG/5ML syrup Take 200 mg by mouth every 6 (six) hours as needed for cough or congestion.      [name] lamiVUDine (EPIVIR) 10 MG/ML solution Take 50 mg by mouth daily at 12 noon.      . midodrine (PROAMATINE) 10 MG tablet Take 10 mg by mouth 2 (two) times daily. At 630am and 10pm      . Multiple Vitamins-Minerals (CERTAGEN PO) Take 1 tablet by mouth daily at 12 noon.      . ritonavir (NORVIR) 100 MG TABS Take 1 tablet (100 mg total) by mouth daily with breakfast.  30 tablet  0  . raltegravir (ISENTRESS) 400 MG tablet Take 1 tablet (400 mg total) by mouth 2 (two) times daily.  60 tablet  11  . tenofovir (VIREAD) 300 MG tablet Take 1 tablet (300 mg total) by mouth once a week.  5 tablet  6     Lab Results: UA: ND   Recent Labs  04/19/13 2236 04/20/13 0345  WBC 5.8 4.0  HGB 9.2* 9.0*  HCT 27.7* 27.1*  PLT 144* 124*   BMET  Recent Labs  04/19/13 2236 04/20/13 0345  NA 137 137  K 4.1 3.7  CL 97 98  CO2 26 25  GLUCOSE 86 79  BUN 16 19  CREATININE 5.11* 5.48*  CALCIUM 9.4 9.0   LFT  Recent Labs  04/20/13 0345  PROT 8.5*   ALBUMIN 2.8*  AST 53*  ALT 27  ALKPHOS 124*  BILITOT 0.7   Dg Chest 2 View  04/19/2013   CLINICAL DATA:  Shortness of breath, cough, chest pain and wheezing.  EXAM: CHEST - 2 VIEW  COMPARISON:  CT CHEST W/CM dated 11/15/2012; DG CHEST 1V  PORT dated 11/11/2012  FINDINGS: Lungs show diffuse bronchial thickening and normal volumes. It is suspected that there may be an early infiltrate in the right lower lobe. No edema or pleural fluid is identified. The heart size and mediastinal contours are within normal limits. There is stable mild tortuosity of the thoracic aorta. Bony structures are unremarkable.  IMPRESSION: Diffuse bronchial thickening with suspected early right lower lobe infiltrate.   Electronically Signed   By: Aletta Edouard M.D.   On: 04/19/2013 23:10    ROS: no change in vision + SOB No CP No ABD, No change in bowels No new arthritic CO No neuropathic Sxs No dysuria   PHYSICAL EXAM: Blood pressure 111/72, pulse 81, temperature 98.4 F (36.9 C), temperature source Oral, resp. rate 20, height 5\' 7"  (1.702 m), weight 87.5 kg (192 lb 14.4 oz), SpO2 95.00%. HEENT: PERRLA EOMI NECK:No JVD LUNGS:Scattered wheezes.  Few crackles Rt base CARDIAC:RRR 3/6 systolic M ABD:+ BS NTND No HSM EXT:No edema.  Lt thigh AVG + bruit NEURO:CNI M&SI OX3 no asterixis  Assessment: 1. RT PNA 2. Sec HPTH 3. Anemia 4. HIV PLAN: 1. HD tomorrow 2. Start aranesp  3. Resume hectorol   Windy Kalata 04/20/2013, 1:58 PM

## 2013-04-20 NOTE — H&P (Signed)
Internal Medicine Attending Admission Note Date: 04/20/2013  Patient name: [name] Medical record number: [id number] Date of birth: Jan 31, 1955 Age: 58 y.o. Gender: female  I saw and evaluated the patient. I reviewed the resident's note and I agree with the resident's findings and plan as documented in the resident's note, with the following additional comments.  Chief Complaint(s): Cough, shortness of breath  History - key components related to admission: Patient is a 58 year old woman with history of ESRD on hemodialysis, HIV, and other problems as outlined in the medical history admitted with complaint of cough and shortness of breath.  Her cough is nonproductive.    Physical Exam - key components related to admission:  Filed Vitals:   04/20/13 0045 04/20/13 0100 04/20/13 0151 04/20/13 0410  BP: 88/46 76/45 104/63 111/72  Pulse: 79 79 77 81  Temp:   98.6 F (37 C) 98.4 F (36.9 C)  TempSrc:   Oral Oral  Resp: 19 15 19 20   Height:   5\' 7"  (1.702 m)   Weight:   192 lb 14.4 oz (87.5 kg)   SpO2: 99% 98% 97% 95%    General: Alert, oriented, no acute distress Lungs: Clear Heart: Regular; 2/6 systolic murmur; no S3, no S4 Abdomen: Bowel sounds present, soft, nontender Extremities: No edema  Lab results:   Basic Metabolic Panel:  Recent Labs  04/19/13 2236 04/20/13 0345  NA 137 137  K 4.1 3.7  CL 97 98  CO2 26 25  GLUCOSE 86 79  BUN 16 19  CREATININE 5.11* 5.48*  CALCIUM 9.4 9.0    Liver Function Tests:  Recent Labs  04/19/13 2236 04/20/13 0345  AST 58* 53*  ALT 29 27  ALKPHOS 133* 124*  BILITOT 0.7 0.7  PROT 9.0* 8.5*  ALBUMIN 2.9* 2.8*    CBC:  Recent Labs  04/19/13 2236 04/20/13 0345  WBC 5.8 4.0  HGB 9.2* 9.0*  HCT 27.7* 27.1*  MCV 104.5* 105.4*  PLT 144* 124*    Recent Labs  04/19/13 2236 04/20/13 0345  NEUTROABS 2.6 2.1  LYMPHSABS 2.3 1.3  MONOABS 0.9 0.5  EOSABS 0.1 0.0  BASOSABS 0.0 0.0     BNP:  Recent Labs  04/19/13 2236  PROBNP 39695.0*     Urinalysis    Component Value Date/Time   COLORURINE YELLOW 11/30/2007 0730   APPEARANCEUR CLOUDY* 11/30/2007 0730   LABSPEC 1.024 11/30/2007 0730   PHURINE 5.5 11/30/2007 0730   GLUCOSEU NEGATIVE 11/30/2007 0730   HGBUR LARGE* 11/30/2007 0730   BILIRUBINUR SMALL* 11/30/2007 0730   KETONESUR 15* 11/30/2007 0730   PROTEINUR >300* 11/30/2007 0730   UROBILINOGEN 1.0 11/30/2007 0730   NITRITE NEGATIVE 11/30/2007 0730   LEUKOCYTESUR MODERATE* 11/30/2007 0730     Imaging results:  Dg Chest 2 View  04/19/2013   CLINICAL DATA:  Shortness of breath, cough, chest pain and wheezing.  EXAM: CHEST - 2 VIEW  COMPARISON:  CT CHEST W/CM dated 11/15/2012; DG CHEST 1V PORT dated 11/11/2012  FINDINGS: Lungs show diffuse bronchial thickening and normal volumes. It is suspected that there may be an early infiltrate in the right lower lobe. No edema or pleural fluid is identified. The heart size and mediastinal contours are within normal limits. There is stable mild tortuosity of the thoracic aorta. Bony structures are unremarkable.  IMPRESSION: Diffuse bronchial thickening with suspected early right lower lobe infiltrate.   Electronically Signed   By: Aletta Edouard M.D.   On: 04/19/2013 23:10  Other results: EKG: Sinus rhythm; probable left atrial enlargement; left anterior fascicular block; abnormal R-wave progression, early transition; borderline repolarization abnormality; prolonged QT interval; artifact  Assessment & Plan by Problem:  1.  Healthcare associated pneumonia.  Patient presents with cough, shortness of breath, fever, and chest x-ray findings indicative of pneumonia.  Plan is empiric broad-spectrum IV antibiotics pending results of blood and sputum cultures; supplement oxygen and follow saturations; supportive care.  2.  HIV infection.  Patient is followed in the ID clinic.  Plans include continue current antiretroviral regimen.    3.  End-stage  renal disease.  Plan is hemodialysis as per nephrology.  4.  Hypotension.  Patient's blood pressure has improved today.    5.  Other problems and plans as per the resident physician's note.  6.  Dr. Ellwood Dense will take over as attending physician tomorrow 04/21/2013.

## 2013-04-20 NOTE — Progress Notes (Signed)
Report received from [facility] in the ED at 0100.

## 2013-04-20 NOTE — H&P (Signed)
Date: 04/20/2013               Patient Name:  [name] MRN: [id number]  DOB: [birth year] Age / Sex: 58 y.o., female   PCP: Ivin Poot, MD         Medical Service: Internal Medicine Teaching Service         Attending Physician: Dr. Axel Filler, MD    First Contact: Dr Mechele Claude Pager: [phone]  Second Contact: Dr. Eulas Post Pager: [phone]       After Hours (After 5p/  First Contact Pager: [phone]  weekends / holidays): Second Contact Pager: [phone]   Chief Complaint: Cough and Shortness of breath.  History of Present Illness: 80 y o F with PMH of ESRD on HD, HIV, orthostatic hypotension, presented with c/o Cough that has been present for about 2 weeks, patient says she is unable to cough up sputum. Also c/o of SOB that she noticed today, present with exertion, uses 2 pillows and has for several years, no recent travels, no hx of blood clots in pt and family, no recent surgeries, no recent leg swelling or pain, does not smoke cigarettes, no recent weight gain. Pt stays in a nursing home- [facility], says she has been having fever, temperature was checked- 100.7. Pt does not make any urine, no headaches or photophobia, No Chest pain, diaphoresis or palpitations. Pt has chronic orthostatic hypotension , but denies dizziness. Last hemodialysis session was on the day of presentation.  Meds: Current Facility-Administered Medications  Medication Dose Route Frequency Provider Last Rate Last Dose  . sodium chloride 0.9 % bolus 500 mL  500 mL Intravenous Once Julianne Rice, MD      . vancomycin (VANCOCIN) IVPB 1000 mg/200 mL premix  1,000 mg Intravenous Once Julianne Rice, MD 200 mL/hr at 04/20/13 0040 1,000 mg at 04/20/13 0040   Current Outpatient Prescriptions  Medication Sig Dispense Refill  . abacavir (ZIAGEN) 300 MG tablet Take 600 mg by mouth daily at 12 noon.       [name] aspirin 81 MG chewable tablet Chew 81 mg by mouth daily at 12 noon.       [name] b complex vitamins tablet Take  1 tablet by mouth daily at 12 noon.       . calcium acetate (PHOSLO) 667 MG capsule Take 2 capsules (1,334 mg total) by mouth 3 (three) times daily with meals.      . Darunavir Ethanolate (PREZISTA) 800 MG tablet Take 1 tablet (800 mg total) by mouth daily with breakfast.  30 tablet  0  . ethyl chloride spray Apply 1 application topically 3 (three) times a week. To graft for dialysis Tuesday, Thursday and Saturday      . guaifenesin (ROBITUSSIN) 100 MG/5ML syrup Take 200 mg by mouth every 6 (six) hours as needed for cough or congestion.      [name] lamiVUDine (EPIVIR) 10 MG/ML solution Take 50 mg by mouth daily at 12 noon.      . midodrine (PROAMATINE) 10 MG tablet Take 10 mg by mouth 2 (two) times daily. At 630am and 10pm      . Multiple Vitamins-Minerals (CERTAGEN PO) Take 1 tablet by mouth daily at 12 noon.      . ritonavir (NORVIR) 100 MG TABS Take 1 tablet (100 mg total) by mouth daily with breakfast.  30 tablet  0  . raltegravir (ISENTRESS) 400 MG tablet Take 1 tablet (400 mg total) by mouth 2 (two) times daily.  60 tablet  11  . tenofovir (VIREAD) 300 MG tablet Take 1 tablet (300 mg total) by mouth once a week.  5 tablet  6    Allergies: Allergies as of 04/19/2013 - Review Complete 04/19/2013  Allergen Reaction Noted  . Penicillins Nausea And Vomiting and Rash 12/23/2010   Past Medical History  Diagnosis Date  . Human immunodeficiency virus (HIV) disease   . Hypertension   . Clostridium difficile infection 04/04/10    07/2011 and 08/2011  . Bacteriuria, asymptomatic 04/04/10    Culture grew VRE sensitive to linesolid   . MGUS (monoclonal gammopathy of unknown significance)   . History of bacteremia     MSSA; 05/2011, 08/2011  . Anemia   . Systolic heart failure     08/27/11 EF 35-40%  . Cancer   . ESRD (end stage renal disease) 03/19/2007    Started HD in 2010.  ESRD was due to HIV per pt. Gets HD on [address] at [facility] on TTS schedule.     . Peripheral vascular disease   .  Endocarditis     MSSA  . 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 Clostridium difficile colitis   . Septicemia due to enterococcus    Past Surgical History  Procedure Laterality Date  . Av fistula placement    . Insertion of dialysis catheter  12/30/2010    Procedure: INSERTION OF DIALYSIS CATHETER;  Surgeon: Elam Dutch, MD;  Location: [place];  Service: Vascular;  Laterality: Left;  Insertion of left internal jugular dialysis catheter  . Vascular surgery    . Insertion of dialysis catheter  05/26/2011    Procedure: INSERTION OF DIALYSIS CATHETER;  Surgeon: Conrad Williams, MD;  Location: [place];  Service: Vascular;  Laterality: N/A;  Insertion tunneled dialysis catheter in Right Internal Jugular with 23 cm catheter   . Insertion of dialysis catheter  08/26/2011    Procedure: INSERTION OF DIALYSIS CATHETER;  Surgeon: Elam Dutch, MD;  Location: [facility] OR;  Service: Vascular;  Laterality: Right;  insertion of Right Femoral vein Dialysis catheter  . Tee without cardioversion  08/27/2011    Procedure: TRANSESOPHAGEAL ECHOCARDIOGRAM (TEE);  Surgeon: Pixie Casino, MD;  Location: [place];  Service: Cardiovascular;  Laterality: N/A;  . Av fistula placement  09/01/2011    Procedure: INSERTION OF ARTERIOVENOUS (AV) GORE-TEX GRAFT ARM;  Surgeon: Rosetta Posner, MD;  Location: [facility] OR;  Service: Vascular;  Laterality: Right;  Using 16mm x 50  stretch goretex graft  . Thrombectomy and revision of arterioventous (av) goretex  graft Right 02/13/2012    Procedure: THROMBECTOMY AND REVISION OF ARTERIOVENTOUS (AV) GORETEX  GRAFT;  Surgeon: Angelia Mould, MD;  Location: [place];  Service: Vascular;  Laterality: Right;  . Tee without cardioversion N/A 02/16/2012    Procedure: TRANSESOPHAGEAL ECHOCARDIOGRAM (TEE);  Surgeon: Fay Records, MD;  Location: [place];  Service: Cardiovascular;  Laterality: N/A;  Rm 6710  . Avgg removal Right 03/29/2012    Procedure:  REMOVAL OF ARTERIOVENOUS GORETEX GRAFT ([place]);  Surgeon: Rosetta Posner, MD;  Location: [place];  Service: Vascular;  Laterality: Right;  . Insertion of dialysis catheter N/A 03/29/2012    Procedure: INSERTION OF DIALYSIS CATHETER;  Surgeon: Rosetta Posner, MD;  Location: [place];  Service: Vascular;  Laterality: N/A;  Ultrasound guided  . Av fistula placement Left 05/03/2012    Procedure: INSERTION OF ARTERIOVENOUS (AV)  GORE-TEX GRAFT THIGH USING 48mm X 70cm Gore-Tex Graft ;  Surgeon: Rosetta Posner, MD;  Location: [facility] OR;  Service: Vascular;  Laterality: Left;  . Transthoracic echocardiogram  08/27/2011    EF 35-40%, mild-moderate concetric hypertrophy of the LV, moderately reduced LV systolic function,   Family History  Problem Relation Age of Onset  . Alcohol abuse      family h/o addiction/alcoholism  . Diabetes Cousin     first degree relatives  . Kidney disease Mother   . Kidney disease Maternal Uncle   . Cancer Sister   . Heart disease Father    History   Social History  . Marital Status: Single    Spouse Name: N/A    Number of Children: N/A  . Years of Education: N/A   Occupational History  . Not on file.   Social History Main Topics  . Smoking status: Never Smoker   . Smokeless tobacco: Never Used  . Alcohol Use: No  . Drug Use: Yes    Special: Cocaine     Comment: quit 58yrs ago  . Sexual Activity: No   Other Topics Concern  . Not on file   Social History Narrative   Lives with daughter(does not know dx). Oldest daughter does not dx.   Current partner of may years,? fidelity    Review of Systems: CONSTITUTIONAL- No weightloss, night sweat, or appetite. SKIN- No Rash, or colour changes. HEAD- No Headache, or dizziness. CARDIAC- No Palpitations, DOE, PND, or chest pain. GI- No Dysphagia, nausea, vomiting, diarrhoea, constipation, but endorses abd pain- Lower abdomen earlier in the day, burning, but had resolved, Last BM was prior to arrival. URINARY- produces no  Urine. NEUROLOGIC- No Numbness, syncope, or burning.  Physical Exam: Blood pressure 83/34, pulse 84, temperature 101 F (38.3 C), temperature source Oral, resp. rate 23, SpO2 99.00%. GENERAL- alert, co-operative, appears as stated age, not in any distress, on o2 by Nasal cannula. HEENT- Atraumatic, normocephalic, PERRL, EOMI, oral mucosa appears moist.  CARDIAC- Regular, 3/6 grade systolic murmur, no murmurs, rubs or gallops. RESP- Moving equal volumes of air, and bilateral basal expiratory rhonchorous sounds. ABDOMEN- Soft, non tender,  no palpable masses or organomegaly, bowel sounds present. BACK- Normal curvature of the spine, No tenderness along the vertebrae, no CVA tenderness. NEURO- No obvious Cr N abnormality, strenght equal and present in all extremities. EXTREMITIES- pulse 2+, symmetric, no pedal edema, Fistula on left upper extremity. SKIN- Warm, dry, No rash or lesion. PSYCH- Normal mood and affect, appropriate thought content and speech.  Lab results: Basic Metabolic Panel:  Recent Labs  04/19/13 2236  NA 137  K 4.1  CL 97  CO2 26  GLUCOSE 86  BUN 16  CREATININE 5.11*  CALCIUM 9.4  Anion Gap- 14.  Liver Function Tests:  Recent Labs  04/19/13 2236  AST 58*  ALT 29  ALKPHOS 133*  BILITOT 0.7  PROT 9.0*  ALBUMIN 2.9*   CBC:  Recent Labs  04/19/13 2236  WBC 5.8  NEUTROABS 2.6  HGB 9.2*  HCT 27.7*  MCV 104.5*  PLT 144*  Baseline hgb 10-12. Platelet ~Baseline. BNP:  Recent Labs  04/19/13 2236  PROBNP [id number]*     Imaging results:  Dg Chest 2 View  04/19/2013   CLINICAL DATA:  Shortness of breath, cough, chest pain and wheezing.  EXAM: CHEST - 2 VIEW  COMPARISON:  CT CHEST W/CM dated 11/15/2012; DG CHEST 1V PORT dated 11/11/2012  FINDINGS: Lungs show diffuse bronchial  thickening and normal volumes. It is suspected that there may be an early infiltrate in the right lower lobe. No edema or pleural fluid is identified. The heart size and  mediastinal contours are within normal limits. There is stable mild tortuosity of the thoracic aorta. Bony structures are unremarkable.  IMPRESSION: Diffuse bronchial thickening with suspected early right lower lobe infiltrate.   Electronically Signed   By: Aletta Edouard M.D.   On: 04/19/2013 23:10    Other results: EKG: RAte- 96bpm, regular, sinus rhthm, no St elevation, minimal T wave changes in lead II, artuifacts in most leads, Axis- Left axis deviation.  Assessment & Plan by Problem: Principal Problem:   HCAP (healthcare-associated pneumonia) Active Problems:   HIV INFECTION   ANEMIA OF CHRONIC DISEASE   ESRD (end stage renal disease)   Hypotension, intermittent esp after HD   Orthostatic hypotension  Health care Assoc PNA- Most likely cause of pts Cough, SOB and Fever- 101 High in the ED in this pt with ESRD on HD and from a nursing home. WBC WNL- 5.8, but pt has HIV. Chest xray suggestive of Right lower lobe PNA. Pt also hypotensive, but does not meet criteria for sepsis. Pro- BNP elevated- [id number], in the setting of ESRD. Ef-08/27/2011- 35-40%, TEE done 2014- Ef not documented. - Admit to Med surg - Blood cultures- Pending - Cont Iv antibiotic- Vanc and Cefepime per pharm (allergy to penicillin) - Urine Legionella and strep antigen  HIV infection- last viral load 04/13/2013- 3008, last CD4 count 01/17/2013- 210. Follows with Dr. Johnnye Sima. Home meds include Abacavir, Prezista, Lamivudine, Raltegravir, and ritonavir.  - Continue antiritoviral meds.  End-stage renal disease on hemodialysis- on Tuesday Thursday and Saturday. Last dialysis session today- tuesday. - No acute need for dialysis at this time, consult nephrology in the morning.   Hypotension- Chronic issue for patient, intermittent especially after hemodialysis. home meds include midodrine 10 mg twice a day. - Cont Midodrine.  Dispo: Disposition is deferred at this time, awaiting improvement of current medical problems.    The patient does have a current PCP Ivin Poot, MD) and does need an [facility] hospital follow-up appointment after discharge.  The patient does not know have transportation limitations that hinder transportation to clinic appointments.  Signed: Jenetta Downer, MD 04/20/2013, 12:54 AM

## 2013-04-20 NOTE — ED Notes (Signed)
Attempted to call report

## 2013-04-20 NOTE — Progress Notes (Signed)
ANTIBIOTIC CONSULT NOTE - INITIAL  Pharmacy Consult for Vancomycin/Cefepime  Indication: pneumonia  Allergies  Allergen Reactions  . Penicillins Nausea And Vomiting and Rash    Patient Measurements: Height: 5\' 7"  (170.2 cm) Weight: 192 lb 14.4 oz (87.5 kg) IBW/kg (Calculated) : 61.6 Vital Signs: Temp: 98.6 F (37 C) (04/15 0151) Temp src: Oral (04/15 0151) BP: 104/63 mmHg (04/15 0151) Pulse Rate: 77 (04/15 0151)  Labs:  Recent Labs  04/19/13 2236  WBC 5.8  HGB 9.2*  PLT 144*  CREATININE 5.11*   Estimated Creatinine Clearance: 14 ml/min (by C-G formula based on Cr of 5.11).  Medical History: Past Medical History  Diagnosis Date  . Human immunodeficiency virus (HIV) disease   . Hypertension   . Clostridium difficile infection 04/04/10    07/2011 and 08/2011  . Bacteriuria, asymptomatic 04/04/10    Culture grew VRE sensitive to linesolid   . MGUS (monoclonal gammopathy of unknown significance)   . History of bacteremia     MSSA; 05/2011, 08/2011  . Anemia   . Systolic heart failure     08/27/11 EF 35-40%  . Cancer   . ESRD (end stage renal disease) 03/19/2007    Started HD in 2010.  ESRD was due to HIV per pt. Gets HD on [address] at [facility] on TTS schedule.     . Peripheral vascular disease   . Endocarditis     MSSA  . 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 Clostridium difficile colitis   . Septicemia due to enterococcus    Assessment: 58 y/o F from SNF with likely PNA, ESRD on HD TTS, WBC wnl, Tmax 101, other labs as above.   Goal of Therapy:  Pre-HD vancomycin level 15-25 mg/L  Plan:  -Vancomycin 2000 mg IV LOAD, then 1000 mg IV qHD TTS -Cefepime 2g IV on HD days (TTS) at 1800 -Trend WBC, temp, renal function  -Drug levels as indicated   Narda Bonds 04/20/2013,2:01 AM

## 2013-04-20 NOTE — ED Notes (Signed)
Discussed with Dr. Lita Mains MD's request to stop bolus, and reviewed vitals.  BP increasing: 88/46.  MD acknowledges, no new orders received.

## 2013-04-20 NOTE — Progress Notes (Signed)
Clinical Social Work Department BRIEF PSYCHOSOCIAL ASSESSMENT 04/20/2013  Patient:  [name], [name]     Account Number:  [id number]     Admit date:  04/19/2013  Clinical Social Worker:  Megan Salon  Date/Time:  04/20/2013 11:36 AM  Referred by:  RN  Date Referred:  04/20/2013 Referred for  SNF Placement   Other Referral:   Interview type:  Patient Other interview type:    PSYCHOSOCIAL DATA Living Status:  FACILITY Admitted from facility:  [facility] Level of care:  [place] Primary support name:  Geneen Ledesma Primary support relationship to patient:  CHILD, ADULT Degree of support available:   Good    CURRENT CONCERNS Current Concerns  Post-Acute Placement   Other Concerns:    SOCIAL WORK ASSESSMENT / PLAN Per RN, patient is from [facility]. CSW went into patient's room and explained social worker role and reason for visit. Patient was sitting up in the bed, smiling. Patient states she is from [facility] and has been there for [name] while. CSW asked patient how patient liked it there and patient stated she really likes it and has many friends there. Patient stated she would like to return back when ready.   Assessment/plan status:  Psychosocial Support/Ongoing Assessment of Needs Other assessment/ plan:   Information/referral to community resources:   CSW contact information    PATIENT'S/FAMILY'S RESPONSE TO PLAN OF CARE: Patient states she really likes the care at [facility] and has many friends there. Patient would like to return back       Jeanette Caprice, MSW, [place]

## 2013-04-20 NOTE — Discharge Summary (Signed)
Name: [name] MRN: [id number] DOB: 1955/03/12 58 y.o. PCP: Ivin Poot, MD  Date of Admission: 04/19/2013 10:26 PM Date of Discharge: 04/21/2013 Attending Physician: Madilyn Fireman, MD  Discharge Diagnosis: Principal Problem:   HCAP (healthcare-associated pneumonia) Active Problems:   HIV INFECTION   ANEMIA OF CHRONIC DISEASE   ESRD (end stage renal disease)   Hypotension, intermittent esp after HD   Orthostatic hypotension  Discharge Medications:   Medication List         abacavir 300 MG tablet  Commonly known as:  ZIAGEN  Take 600 mg by mouth daily at 12 noon.     aspirin 81 MG chewable tablet  Chew 81 mg by mouth daily at 12 noon.     b complex vitamins tablet  Take 1 tablet by mouth daily at 12 noon.     calcium acetate 667 MG capsule  Commonly known as:  PHOSLO  Take 2 capsules (1,334 mg total) by mouth 3 (three) times daily with meals.     CERTAGEN PO  Take 1 tablet by mouth daily at 12 noon.     Darunavir Ethanolate 800 MG tablet  Commonly known as:  PREZISTA  Take 1 tablet (800 mg total) by mouth daily with breakfast.     ethyl chloride spray  Apply 1 application topically 3 (three) times a week. To graft for dialysis Tuesday, Thursday and Saturday     guaifenesin 100 MG/5ML syrup  Commonly known as:  ROBITUSSIN  Take 200 mg by mouth every 6 (six) hours as needed for cough or congestion.     lamiVUDine 10 MG/ML solution  Commonly known as:  EPIVIR  Take 50 mg by mouth daily at 12 noon.     levofloxacin 500 MG tablet  Commonly known as:  LEVAQUIN  Take 1 tablet (500 mg total) by mouth every other day.  Start taking on:  04/23/2013     midodrine 10 MG tablet  Commonly known as:  PROAMATINE  Take 10 mg by mouth 2 (two) times daily. At 630am and 10pm     raltegravir 400 MG tablet  Commonly known as:  ISENTRESS  Take 1 tablet (400 mg total) by mouth 2 (two) times daily.     ritonavir 100 MG Tabs tablet  Commonly known as:  NORVIR  Take  1 tablet (100 mg total) by mouth daily with breakfast.     tenofovir 300 MG tablet  Commonly known as:  VIREAD  Take 1 tablet (300 mg total) by mouth once a week.       Disposition and follow-up:   Ms.Zakaiya A Castleman was discharged from [facility] in Stable condition.  At the hospital follow up visit please address:  1.  Cough/HCAP- assess for symptom improvement; should be receiving levaquin for a total of 7 days (given renal function will need next dose on 4/18)  2.  Labs / imaging needed at time of follow-up: none 3.  Pending labs/ test needing follow-up: BCX x 2 (have been NGTD x 2 days)  Follow-up Appointments: Follow-up Information   Follow up with Blain Pais, MD On 04/26/2013. [facility] Follow up)    Specialty:  Internal Medicine   Contact information:   [address] [place] [address] [phone]       Discharge Instructions:  Future Appointments Provider Department Dept Phone   04/26/2013 3:45 PM Blain Pais, MD [place] [phone]   05/02/2013 8:45  AM [facility] [place]   Please wear two piece clothing and wear no powder or deodorant. Please arrive 15 minutes early prior to your appointment time.   05/18/2013 10:00 AM Truman Hayward, MD [facility] [phone]      Consultations: Treatment Team:  Windy Kalata, MD  Procedures Performed:  Dg Chest 2 View  04/19/2013   CLINICAL DATA:  Shortness of breath, cough, chest pain and wheezing.  EXAM: CHEST - 2 VIEW  COMPARISON:  CT CHEST W/CM dated 11/15/2012; DG CHEST 1V PORT dated 11/11/2012  FINDINGS: Lungs show diffuse bronchial thickening and normal volumes. It is suspected that there may be an early infiltrate in the right lower lobe. No edema or pleural fluid is identified. The heart size and mediastinal contours are within normal limits. There is  stable mild tortuosity of the thoracic aorta. Bony structures are unremarkable.  IMPRESSION: Diffuse bronchial thickening with suspected early right lower lobe infiltrate.   Electronically Signed   By: Aletta Edouard M.D.   On: 04/19/2013 23:10   Admission HPI:  16 y o F with PMH of ESRD on HD, HIV, orthostatic hypotension, presented with c/o Cough that has been present for about 2 weeks, patient says she is unable to cough up sputum. Also c/o of SOB that she noticed today, present with exertion, uses 2 pillows and has for several years, no recent travels, no hx of blood clots in pt and family, no recent surgeries, no recent leg swelling or pain, does not smoke cigarettes, no recent weight gain. Pt stays in a nursing home- [facility], says she has been having fever, temperature was checked- 100.7. Pt does not make any urine, no headaches or photophobia, No Chest pain, diaphoresis or palpitations. Pt has chronic orthostatic hypotension , but denies dizziness. Last hemodialysis session was on the day of presentation.  Hospital Course by problem list:   HCAP- Pt presents with nonproductive cough x 2 weeks found to be febrile to 101 with CXR showing early RLL infiltrate with repeat CXR on 4/l16 confirming mild RLL interstitial infiltrate. Patient's blood pressure is chronically low and on admission, patient's blood pressure ranged 70s-80s/30s-40s. No leukocytosis, though pt with hx of leukocytopenia given uncontrolled HIV. Patient was started on vancomycin and cefepime for HCAP coverage. Patient received broad spectrum abx for 2 days and was then transitioned to levaquin PO given clinical improvement. On morning of discharge, patient had been afebrile for approx. 48hrs and her blood pressure had increased to low 100s/60-70s. Patient was kept on supplemental oxygen at 2LPM for comfort, though was never hypoxic. This was discontinued on HD 2. Of note, patient is ESRD and does not make much urine so the urine  legionella and strep antigen were not collected. At time of discharge blood cultures had been NGTD x 2 days. Patient was at [facility] prior to admission and she was sent back upon discharge.  HIV disease- Not well controlled. VL on 04/13/2013 3008, CD4 count 01/17/2013- 210. Follows with Dr. Johnnye Sima. Patient is on Abacavir, Prezista, Lamivudine, Raltegravir, and ritonavir at home. Continued home regimen.    End-stage renal disease on hemodialysis-HD on T/TH/S. Nephrology was consulted for HD, pt received HD session per her usual schedule on day of discharge.  Hypotension- Chronic issue for patient, especially after hemodialysis. Blood pressure 70-80s/30-40s on admission, s/p fluids and abx patient's blood pressure increased to low 100/60-70s. Pt is on midodrine  10 mg BID at home, which was continued.  Discharge Vitals:   BP 111/65  Pulse 71  Temp(Src) 98.9 F (37.2 C) (Oral)  Resp 15  Ht 5\' 7"  (1.702 m)  Wt 192 lb 14.4 oz (87.5 kg)  BMI 30.21 kg/m2  SpO2 95%  Discharge Labs:  Results for orders placed during the hospital encounter of 04/19/13 (from the past 24 hour(s))  RENAL FUNCTION PANEL     Status: Abnormal   Collection Time    04/21/13  2:12 AM      Result Value Ref Range   Sodium 136 (*) 137 - 147 mEq/L   Potassium 4.3  3.7 - 5.3 mEq/L   Chloride 95 (*) 96 - 112 mEq/L   CO2 25  19 - 32 mEq/L   Glucose, Bld 77  70 - 99 mg/dL   BUN 29 (*) 6 - 23 mg/dL   Creatinine, Ser 7.33 (*) 0.50 - 1.10 mg/dL   Calcium 8.8  8.4 - 10.5 mg/dL   Phosphorus 5.0 (*) 2.3 - 4.6 mg/dL   Albumin 2.7 (*) 3.5 - 5.2 g/dL   GFR calc non Af Amer 6 (*) >90 mL/min   GFR calc Af Amer 6 (*) >90 mL/min    Signed: Rebecca Eaton, MD 04/21/2013, 11:32 AM   Time Spent on Discharge: 35 minutes Services Ordered on Discharge: none Equipment Ordered on Discharge: none

## 2013-04-21 ENCOUNTER — Inpatient Hospital Stay (HOSPITAL_COMMUNITY): Payer: Medicaid Other

## 2013-04-21 LAB — RENAL FUNCTION PANEL
Albumin: 2.7 g/dL — ABNORMAL LOW (ref 3.5–5.2)
BUN: 29 mg/dL — ABNORMAL HIGH (ref 6–23)
CO2: 25 mEq/L (ref 19–32)
Calcium: 8.8 mg/dL (ref 8.4–10.5)
Chloride: 95 mEq/L — ABNORMAL LOW (ref 96–112)
Creatinine, Ser: 7.33 mg/dL — ABNORMAL HIGH (ref 0.50–1.10)
GFR calc Af Amer: 6 mL/min — ABNORMAL LOW (ref >90)
GFR calc non Af Amer: 6 mL/min — ABNORMAL LOW (ref >90)
Glucose, Bld: 77 mg/dL (ref 70–99)
Phosphorus: 5 mg/dL — ABNORMAL HIGH (ref 2.3–4.6)
Potassium: 4.3 mEq/L (ref 3.7–5.3)
Sodium: 136 mEq/L — ABNORMAL LOW (ref 137–147)

## 2013-04-21 LAB — HEPATITIS B SURFACE ANTIGEN: Hepatitis B Surface Ag: NEGATIVE

## 2013-04-21 MED ORDER — MIDODRINE HCL 5 MG PO TABS
ORAL_TABLET | ORAL | Status: AC
  Filled 2013-04-21: qty 2

## 2013-04-21 MED ORDER — LEVOFLOXACIN 750 MG PO TABS
750.0000 mg | ORAL_TABLET | Freq: Once | ORAL | Status: AC
  Administered 2013-04-21: 750 mg via ORAL
  Filled 2013-04-21 (×2): qty 1

## 2013-04-21 MED ORDER — DARBEPOETIN ALFA-POLYSORBATE 60 MCG/0.3ML IJ SOLN
INTRAMUSCULAR | Status: AC
  Filled 2013-04-21: qty 0.3

## 2013-04-21 MED ORDER — LEVOFLOXACIN 500 MG PO TABS: 500.0000 mg | ORAL_TABLET | ORAL | Status: AC

## 2013-04-21 MED ORDER — DOXERCALCIFEROL 4 MCG/2ML IV SOLN
INTRAVENOUS | Status: AC
  Filled 2013-04-21: qty 4

## 2013-04-21 NOTE — Progress Notes (Signed)
Subjective: Patient feels well this morning. Cough has improved, still nonproductive. No other complaints- no F/C, N/V, abd pain, CP, SOB. Patient feels ready for discharge and would like to return back to [facility].  Objective: Vital signs in last 24 hours: Filed Vitals:   04/20/13 0410 04/20/13 1419 04/20/13 2035 04/21/13 0332  BP: 111/72 114/60 111/68 103/60  Pulse: 81 65 71 89  Temp: 98.4 F (36.9 C) 98.6 F (37 C) 99.4 F (37.4 C) 99 F (37.2 C)  TempSrc: Oral Oral Oral Oral  Resp: 20 18 18 18   Height:      Weight:      SpO2: 95% 96% 98% 98%   Weight change:   Intake/Output Summary (Last 24 hours) at 04/21/13 0734 Last data filed at 04/20/13 1743  Gross per 24 hour  Intake    840 ml  Output      0 ml  Net    840 ml   Physical Exam General: alert, NAD, lying in bed; appears older than stated age HEENT: NCAT, vision grossly intact, MMM Neck: supple Lungs: CTAB Heart: RRR Abdomen: soft, non-tender, non-distended, normal bowel sounds Extremities: trace pitting edema to BLE, warm Neurologic: alert & oriented X3, cranial nerves II-XII grossly intact, moves all extremities spontaneously   Lab Results: Basic Metabolic Panel:  Recent Labs Lab 04/20/13 0345 04/21/13 0212  NA 137 136*  K 3.7 4.3  CL 98 95*  CO2 25 25  GLUCOSE 79 77  BUN 19 29*  CREATININE 5.48* 7.33*  CALCIUM 9.0 8.8  PHOS  --  5.0*   Liver Function Tests:  Recent Labs Lab 04/19/13 2236 04/20/13 0345 04/21/13 0212  AST 58* 53*  --   ALT 29 27  --   ALKPHOS 133* 124*  --   BILITOT 0.7 0.7  --   PROT 9.0* 8.5*  --   ALBUMIN 2.9* 2.8* 2.7*   CBC:  Recent Labs Lab 04/19/13 2236 04/20/13 0345  WBC 5.8 4.0  NEUTROABS 2.6 2.1  HGB 9.2* 9.0*  HCT 27.7* 27.1*  MCV 104.5* 105.4*  PLT 144* 124*   BNP:  Recent Labs Lab 04/19/13 2236  PROBNP 39695.0*   Micro Results: No results found for this or any previous visit (from the past 240 hour(s)). Studies/Results: Dg Chest 2  View  04/19/2013   CLINICAL DATA:  Shortness of breath, cough, chest pain and wheezing.  EXAM: CHEST - 2 VIEW  COMPARISON:  CT CHEST W/CM dated 11/15/2012; DG CHEST 1V PORT dated 11/11/2012  FINDINGS: Lungs show diffuse bronchial thickening and normal volumes. It is suspected that there may be an early infiltrate in the right lower lobe. No edema or pleural fluid is identified. The heart size and mediastinal contours are within normal limits. There is stable mild tortuosity of the thoracic aorta. Bony structures are unremarkable.  IMPRESSION: Diffuse bronchial thickening with suspected early right lower lobe infiltrate.   Electronically Signed   By: Aletta Edouard M.D.   On: 04/19/2013 23:10   Medications: I have reviewed the patient's current medications. Scheduled Meds: . abacavir  600 mg Oral Q1200  . aspirin  81 mg Oral Q1200  . B-complex with vitamin C  1 tablet Oral Q1200  . calcium acetate  1,334 mg Oral TID WC  . ceFEPime (MAXIPIME) IV  2 g Intravenous Q T,Th,Sat-1800  . darbepoetin (ARANESP) injection - DIALYSIS  60 mcg Intravenous Q Thu-HD  . Darunavir Ethanolate  800 mg Oral Q breakfast  .  doxercalciferol  5 mcg Intravenous Q T,Th,Sa-HD  . heparin  5,000 Units Subcutaneous 3 times per day  . lamiVUDine  50 mg Oral Q1200  . midodrine  10 mg Oral BID WC  . raltegravir  400 mg Oral BID  . ritonavir  100 mg Oral Q breakfast  . tenofovir  300 mg Oral Q Wed  . vancomycin  1,000 mg Intravenous Q T,Th,Sa-HD   Continuous Infusions:  PRN Meds:.guaifenesin  Assessment/Plan:  HCAP- Cough improving. Afebrile overnight (Tmax 99). Satting well on 2LPM per [place]. She feels ready to go home. Will need to follow up CXR read this morning and change abx to PO. -check SpO2 on room air -per ID, will transition from vanc/cefepime to PO levaquin today (day 3/7) -awaiting read on repeat CXR -BCX x 2 NGTD -likely d/[facility] back to [facility] today  HIV disease- VL on 04/13/2013 3008, CD4 count 01/17/2013-  210. Poor control, unclear why- does she refuse medications at SNF? Control has improved since January 2015. Follows with Dr. Johnnye Sima. Patient is on Abacavir, Prezista, Lamivudine, Raltegravir, and ritonavir at home. - continue home regimen   End-stage renal disease on hemodialysis-HD on T/TH/S, last HD session Tuesday 4/14.  -nephrology following, HD today  Hypotension- BP much improved with BP ranging 111/60-70 overnight (had been 70s-80s/30-40s at admission). Chronic issue for patient, especially after hemodialysis. On midodrine 10 mg BID at home. -cotinue home midodrine.  -if needed, give IVF w/ caution given low EF (35-40%)  VTE ppx: heparin  Diet: renal  Code: full  Dispo: Discharge back to [facility] today. FL2 has been signed.   The patient does have a current PCP Ivin Poot, MD) and does need an [facility] hospital follow-up appointment after discharge.  The patient does not have transportation limitations that hinder transportation to clinic appointments.  .Services Needed at time of discharge: Y = Yes, Blank = No PT:   OT:   RN:   Equipment:   Other:     LOS: 2 days   Rebecca Eaton, MD 04/21/2013, 7:34 AM

## 2013-04-21 NOTE — Discharge Instructions (Signed)
Please take levaquin 500mg  every 48hours starting on 4/18 for a total of 2 doses.  Pneumonia, Adult Pneumonia is an infection of the lungs.  CAUSES Pneumonia may be caused by bacteria or a virus. Usually, these infections are caused by breathing infectious particles into the lungs (respiratory tract). SYMPTOMS   Cough.  Fever.  Chest pain.  Increased rate of breathing.  Wheezing.  Mucus production. DIAGNOSIS  If you have the common symptoms of pneumonia, your caregiver will typically confirm the diagnosis with a chest X-ray. The X-ray will show an abnormality in the lung (pulmonary infiltrate) if you have pneumonia. Other tests of your blood, urine, or sputum may be done to find the specific cause of your pneumonia. Your caregiver may also do tests (blood gases or pulse oximetry) to see how well your lungs are working. TREATMENT  Some forms of pneumonia may be spread to other people when you cough or sneeze. You may be asked to wear a mask before and during your exam. Pneumonia that is caused by bacteria is treated with antibiotic medicine. Pneumonia that is caused by the influenza virus may be treated with an antiviral medicine. Most other viral infections must run their course. These infections will not respond to antibiotics.  PREVENTION A pneumococcal shot (vaccine) is available to prevent a common bacterial cause of pneumonia. This is usually suggested for:  People over 29 years old.  Patients on chemotherapy.  People with chronic lung problems, such as bronchitis or emphysema.  People with immune system problems. If you are over 65 or have a high risk condition, you may receive the pneumococcal vaccine if you have not received it before. In some countries, a routine influenza vaccine is also recommended. This vaccine can help prevent some cases of pneumonia.You may be offered the influenza vaccine as part of your care. If you smoke, it is time to quit. You may receive  instructions on how to stop smoking. Your caregiver can provide medicines and counseling to help you quit. HOME CARE INSTRUCTIONS   Cough suppressants may be used if you are losing too much rest. However, coughing protects you by clearing your lungs. You should avoid using cough suppressants if you can.  Your caregiver may have prescribed medicine if he or she thinks your pneumonia is caused by a bacteria or influenza. Finish your medicine even if you start to feel better.  Your caregiver may also prescribe an expectorant. This loosens the mucus to be coughed up.  Only take over-the-counter or prescription medicines for pain, discomfort, or fever as directed by your caregiver.  Do not smoke. Smoking is a common cause of bronchitis and can contribute to pneumonia. If you are a smoker and continue to smoke, your cough may last several weeks after your pneumonia has cleared.  A cold steam vaporizer or humidifier in your room or home may help loosen mucus.  Coughing is often worse at night. Sleeping in a semi-upright position in a recliner or using a couple pillows under your head will help with this.  Get rest as you feel it is needed. Your body will usually let you know when you need to rest. SEEK IMMEDIATE MEDICAL CARE IF:   Your illness becomes worse. This is especially true if you are elderly or weakened from any other disease.  You cannot control your cough with suppressants and are losing sleep.  You begin coughing up blood.  You develop pain which is getting worse or is uncontrolled with medicines.  You have a fever.  Any of the symptoms which initially brought you in for treatment are getting worse rather than better.  You develop shortness of breath or chest pain. MAKE SURE YOU:   Understand these instructions.  Will watch your condition.  Will get help right away if you are not doing well or get worse. Document Released: 12/23/2004 Document Revised: 03/17/2011 Document  Reviewed: 03/14/2010 [facility] Patient Information 2014 [place], [place].

## 2013-04-21 NOTE — Care Management Note (Signed)
    Page 1 of 1   04/22/2013     10:30:45 AM CARE MANAGEMENT NOTE 04/22/2013  Patient:  [name], [name]   Account Number:  [id number]  Date Initiated:  04/21/2013  Documentation initiated by:  Winson Eichorn  Subjective/Objective Assessment:   PT ADM ON 04/19/13 WITH AMS, RLL PNA. PTA, PT RESIDES AT [facility].     Action/Plan:   CSW CONSULTED TO FACILITATE RETURN TO SNF WHEN MEDICALLY STABLE FOR DC.   Anticipated DC Date:  04/22/2013   Anticipated DC Plan:  SKILLED NURSING FACILITY  In-house referral  Clinical Social Worker      DC Planning Services  CM consult      Choice offered to / List presented to:             Status of service:  Completed, signed off Medicare Important Message given?   (If response is "NO", the following Medicare IM given date fields will be blank) Date Medicare IM given:   Date Additional Medicare IM given:    Discharge Disposition:  [place]  Per UR Regulation:  Reviewed for med. necessity/level of care/duration of stay  If discussed at [place] of Stay Meetings, dates discussed:    Comments:  04/21/13 Altovise Wahler,RN,BSN [phone] PT DISCHARGED TO [place], PER CSW ARRANGEMENTS.

## 2013-04-21 NOTE — Progress Notes (Signed)
S:Feels better, less cough O:BP 103/60  Pulse 89  Temp(Src) 99 F (37.2 C) (Oral)  Resp 18  Ht 5\' 7"  (1.702 m)  Wt 87.5 kg (192 lb 14.4 oz)  BMI 30.21 kg/m2  SpO2 98%  Intake/Output Summary (Last 24 hours) at 04/21/13 0741 Last data filed at 04/20/13 1743  Gross per 24 hour  Intake    840 ml  Output      0 ml  Net    840 ml   Weight change:  [id number] and alert CVS:RRR 3/6 systolic murmur Resp:few faint crackles on Rt.  Less wheezing Abd:+ BS NTND Ext:no edema  Lt thigh AVG + bruit NEURO:CNI Ox3 no asterixis   . abacavir  600 mg Oral Q1200  . aspirin  81 mg Oral Q1200  . B-complex with vitamin C  1 tablet Oral Q1200  . calcium acetate  1,334 mg Oral TID WC  . ceFEPime (MAXIPIME) IV  2 g Intravenous Q T,Th,Sat-1800  . darbepoetin (ARANESP) injection - DIALYSIS  60 mcg Intravenous Q Thu-HD  . Darunavir Ethanolate  800 mg Oral Q breakfast  . doxercalciferol  5 mcg Intravenous Q T,Th,Sa-HD  . heparin  5,000 Units Subcutaneous 3 times per day  . lamiVUDine  50 mg Oral Q1200  . midodrine  10 mg Oral BID WC  . raltegravir  400 mg Oral BID  . ritonavir  100 mg Oral Q breakfast  . tenofovir  300 mg Oral Q Wed  . vancomycin  1,000 mg Intravenous Q T,Th,Sa-HD   Dg Chest 2 View  04/19/2013   CLINICAL DATA:  Shortness of breath, cough, chest pain and wheezing.  EXAM: CHEST - 2 VIEW  COMPARISON:  CT CHEST W/CM dated 11/15/2012; DG CHEST 1V PORT dated 11/11/2012  FINDINGS: Lungs show diffuse bronchial thickening and normal volumes. It is suspected that there may be an early infiltrate in the right lower lobe. No edema or pleural fluid is identified. The heart size and mediastinal contours are within normal limits. There is stable mild tortuosity of the thoracic aorta. Bony structures are unremarkable.  IMPRESSION: Diffuse bronchial thickening with suspected early right lower lobe infiltrate.   Electronically Signed   By: Aletta Edouard M.D.   On: 04/19/2013 23:10   BMET    Component  Value Date/Time   NA 136* 04/21/2013 0212   K 4.3 04/21/2013 0212   CL 95* 04/21/2013 0212   CO2 25 04/21/2013 0212   GLUCOSE 77 04/21/2013 0212   BUN 29* 04/21/2013 0212   CREATININE 7.33* 04/21/2013 0212   CREATININE 6.68* 05/26/2012 1125   CALCIUM 8.8 04/21/2013 0212   CALCIUM 7.6* 09/14/2007 1321   GFRNONAA 6* 04/21/2013 0212   GFRNONAA 6* 05/26/2012 1125   GFRAA 6* 04/21/2013 0212   GFRAA 7* 05/26/2012 1125   CBC    Component Value Date/Time   WBC 4.0 04/20/2013 0345   RBC 2.57* 04/20/2013 0345   RBC 2.99* 09/01/2011 1533   HGB 9.0* 04/20/2013 0345   HCT 27.1* 04/20/2013 0345   PLT 124* 04/20/2013 0345   MCV 105.4* 04/20/2013 0345   MCH 35.0* 04/20/2013 0345   MCHC 33.2 04/20/2013 0345   RDW 13.5 04/20/2013 0345   LYMPHSABS 1.3 04/20/2013 0345   MONOABS 0.5 04/20/2013 0345   EOSABS 0.0 04/20/2013 0345   BASOSABS 0.0 04/20/2013 0345     Assessment:  1. Rt PNA 2. anemia on aranesp 3. Sec HPTH on hectorol 4. HIV Plan: 1. HD today   Legrand Como  Lysle Rubens

## 2013-04-21 NOTE — Progress Notes (Signed)
    Day 2 of stay      Patient name: [name]  Medical record number: [id number]  Date of birth: 1955-05-05  58 year old female with HIV, ESRD on HD, admitted with cough and shortness of breath, assessed to have HCAP and treated with IV Vancomycin and Cefepime, now changed to PO Levaquin. I examined this patient on rounds today with my team. She appears to be doing much better, and has been clinically improving since admission. Her vitals have been stable (last mild hypotension 76/45 on 04/20/13 at 0100 hours, last fever spike 04/19/13 2250 hours) Her vitals today: Filed Vitals:   04/20/13 1419 04/20/13 2035 04/21/13 0332 04/21/13 1024  BP: 114/60 111/68 103/60 111/65  Pulse: 65 71 89 71  Temp: 98.6 F (37 C) 99.4 F (37.4 C) 99 F (37.2 C) 98.9 F (37.2 C)  TempSrc: Oral Oral Oral Oral  Resp: 18 18 18 15   Height:      Weight:      SpO2: 96% 98% 98% 95%   Exam is significant for alert and oriented lady in no acute distress, sitting comfortably in chair, no tachycardia, no abnormal breath sounds, moving air bilaterally okay.   I have reviewed her labs and imaging. I have reviewed Dr Renaldo Fiddler note from today and I agree with the documentation.   Plan for today is HD and then likely discharge to SNF on PO antibiotics if she continues to do well.   I have discussed the care of this patient with my resident team and I agree with their plan. Please see resident note for details.   Chivas Notz 04/21/2013, 12:18 PM.

## 2013-04-21 NOTE — Clinical Social Work Note (Signed)
Patient still in dialysis. CSW provided RN with DC packet. RN to call transport when patient ready to discharge.  Jeanette Caprice, MSW, [place]

## 2013-04-21 NOTE — Progress Notes (Signed)
Attempted to call report to [facility] facility 2X. Nurse unavailable. Left name and number for RN to return phone call for report when available. Iv D/C, Patienttaken off cardiac monitor. Patient transferred [facility]. Roxan Hockey

## 2013-04-22 LAB — HIV-1 GENOTYPR PLUS

## 2013-04-23 ENCOUNTER — Non-Acute Institutional Stay (SKILLED_NURSING_FACILITY): Payer: Medicaid Other | Admitting: Internal Medicine

## 2013-04-23 ENCOUNTER — Encounter: Payer: Self-pay | Admitting: Internal Medicine

## 2013-04-23 DIAGNOSIS — N186 End stage renal disease: Secondary | ICD-10-CM

## 2013-04-23 DIAGNOSIS — J189 Pneumonia, unspecified organism: Secondary | ICD-10-CM

## 2013-04-23 DIAGNOSIS — Z992 Dependence on renal dialysis: Secondary | ICD-10-CM

## 2013-04-23 NOTE — Progress Notes (Signed)
This encounter was created in error - please disregard.

## 2013-04-25 NOTE — Discharge Summary (Signed)
I have reviewed Dr. Renaldo Fiddler note and agree with her discharge management.

## 2013-04-26 ENCOUNTER — Ambulatory Visit: Payer: Self-pay | Admitting: Internal Medicine

## 2013-04-26 LAB — CULTURE, BLOOD (ROUTINE X 2)
Culture: NO GROWTH
Culture: NO GROWTH

## 2013-04-26 NOTE — Progress Notes (Addendum)
Patient ID: [name], female   DOB: [birth year], 58 y.o.   MRN: [id number]                   HISTORY & PHYSICAL  DATE:  04/22/2013    FACILITY: [facility]    LEVEL OF CARE:   SNF   HISTORY OF PRESENT ILLNESS:  This is a 58 year-old lady with a history of end-stage renal disease, on hemodialysis, HIV, who presented with cough for two weeks.  She stated that she was unable to cough up sputum.  Also noted that she had increasing shortness of breath.  Her temperature on the day of transfer was 100.7.  She had a chest x-ray which showed diffuse bronchial thickening.  It was felt there was an early infiltrate in the right lower lobe.  There was no edema or pleural fluid.  Her heart size was normal.  She was also noted to be somewhat hypotensive.  Her pro-BNP was 39,000 in the setting of end-stage renal disease.    The patient was put on vanc and Cefepime.  She seems to have resolved nicely.  She came back to complete a course of Levaquin.    PAST MEDICAL HISTORY/PROBLEM LIST:    Healthcare-acquired pneumonia.    HIV infection.    End-stage renal disease with anemia of chronic disease.      Orthostatic hypotension, especially after dialysis.    CURRENT MEDICATIONS:  Medication list is reviewed.    Abacavir 600 daily at noon.    ASA 81 q.d.    PhosLo 2 capsules three times daily.    Prezista 1 tablet, 800 mg, daily with breakfast.    Guaifenesin 200 q.6 p.r.n.    Epivir 50 mg daily at noon.    Levaquin 500 daily every other day, starting on 04/23/2013.    Midodrine 10 mg b.i.d.      Raltegravir 400 mg b.i.d.    Norvir 100 mg daily.    Tenofovir 300 once a week.    REVIEW OF SYSTEMS:   CHEST/RESPIRATORY:  The patient states she is still coughing.   CARDIAC:   She is not complaining of chest pain.   GI:  No nausea, vomiting or diarrhea.  No abdominal pain.    PHYSICAL EXAMINATION:   VITAL SIGNS:   O2 SATURATIONS:  94% on room air.   RESPIRATIONS:  18 and  unlabored.   PULSE:  85.    GENERAL APPEARANCE:  The patient looks in no distress.  Sitting in her wheelchair.   CHEST/RESPIRATORY:  Shallow, but otherwise clear air entry.  No crackles or wheezes.  Work of breathing is normal.   CARDIOVASCULAR:  CARDIAC:  Midsystolic murmur that sounds benign.  She is euvolemic.  No elevation of her jugular venous pressure.   GASTROINTESTINAL:  LIVER/SPLEEN/KIDNEYS:  No liver, no spleen.  No tenderness.   CIRCULATION:   EDEMA/VARICOSITIES:  Extremities:  Some edema is noted.    ASSESSMENT/PLAN:  Healthcare-acquired pneumonia.   The patient states she has been still coughing, although in no distress.  Complete her Levaquin.     HIV.   Apparently not under the best control.   Viral load on 04/13/2013 was 3,008.  CD4 count is 410.  As mentioned, she has a follow-up with Dr. Johnnye Sima.     End-stage renal disease.   On dialysis on Tuesday, Thursday, Saturday.  There is some suggestion that she is hypotensive during and after dialysis.  I wonder how much  dialysis she is tolerating.    For now, she appears to be stable.  Medical issues are reviewed.  No additional orders are left.

## 2013-05-02 ENCOUNTER — Encounter: Payer: Self-pay | Admitting: Adult Health

## 2013-05-02 ENCOUNTER — Ambulatory Visit (HOSPITAL_COMMUNITY)
Admission: RE | Admit: 2013-05-02 | Discharge: 2013-05-02 | Disposition: A | Discharge status: Home / Self Care | Payer: Medicaid Other | Source: Ambulatory Visit | Attending: Diagnostic Radiology | Admitting: Diagnostic Radiology

## 2013-05-02 DIAGNOSIS — Z1231 Encounter for screening mammogram for malignant neoplasm of breast: Secondary | ICD-10-CM | POA: Insufficient documentation

## 2013-05-02 NOTE — Progress Notes (Signed)
Patient ID: [name], female   DOB: 1955/08/03, 58 y.o.   MRN: [id number]     [facility]  Allergies  Allergen Reactions  . Penicillins Nausea And Vomiting and Rash     Chief Complaint  Patient presents with  . Medical Management of Chronic Issues    HPI:  She is being seen for the management of her chronic illnesses. overall there is little change in her status. There are no concerns being voiced by the nursing staff at this time. She is not voicing any concerns or complaints at this time.   Past Medical History  Diagnosis Date  . Human immunodeficiency virus (HIV) disease   . Hypertension   . Clostridium difficile infection 04/04/10    07/2011 and 08/2011  . Bacteriuria, asymptomatic 04/04/10    Culture grew VRE sensitive to linesolid   . MGUS (monoclonal gammopathy of unknown significance)   . History of bacteremia     MSSA; 05/2011, 08/2011  . Anemia   . Systolic heart failure     08/27/11 EF 35-40%  . Cancer   . ESRD (end stage renal disease) 03/19/2007    Started HD in 2010.  ESRD was due to HIV per pt. Gets HD on [employer] at [facility] on TTS schedule.     . Peripheral vascular disease   . Endocarditis     MSSA  . 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 Clostridium difficile colitis   . Septicemia due to enterococcus     Past Surgical History  Procedure Laterality Date  . Av fistula placement    . Insertion of dialysis catheter  12/30/2010    Procedure: INSERTION OF DIALYSIS CATHETER;  Surgeon: Elam Dutch, MD;  Location: [place];  Service: Vascular;  Laterality: Left;  Insertion of left internal jugular dialysis catheter  . Vascular surgery    . Insertion of dialysis catheter  05/26/2011    Procedure: INSERTION OF DIALYSIS CATHETER;  Surgeon: Conrad Williamsburg, MD;  Location: [place];  Service: Vascular;  Laterality: N/A;  Insertion tunneled dialysis catheter in Right Internal Jugular with 23 cm catheter   . Insertion of  dialysis catheter  08/26/2011    Procedure: INSERTION OF DIALYSIS CATHETER;  Surgeon: Elam Dutch, MD;  Location: [facility] OR;  Service: Vascular;  Laterality: Right;  insertion of Right Femoral vein Dialysis catheter  . Tee without cardioversion  08/27/2011    Procedure: TRANSESOPHAGEAL ECHOCARDIOGRAM (TEE);  Surgeon: Pixie Casino, MD;  Location: [place];  Service: Cardiovascular;  Laterality: N/A;  . Av fistula placement  09/01/2011    Procedure: INSERTION OF ARTERIOVENOUS (AV) GORE-TEX GRAFT ARM;  Surgeon: Rosetta Posner, MD;  Location: [facility] OR;  Service: Vascular;  Laterality: Right;  Using 67mm x 50  stretch goretex graft  . Thrombectomy and revision of arterioventous (av) goretex  graft Right 02/13/2012    Procedure: THROMBECTOMY AND REVISION OF ARTERIOVENTOUS (AV) GORETEX  GRAFT;  Surgeon: Angelia Mould, MD;  Location: [place];  Service: Vascular;  Laterality: Right;  . Tee without cardioversion N/A 02/16/2012    Procedure: TRANSESOPHAGEAL ECHOCARDIOGRAM (TEE);  Surgeon: Fay Records, MD;  Location: [place];  Service: Cardiovascular;  Laterality: N/A;  Rm 6710  . Avgg removal Right 03/29/2012    Procedure: REMOVAL OF ARTERIOVENOUS GORETEX GRAFT ([place]);  Surgeon: Rosetta Posner, MD;  Location: [place];  Service: Vascular;  Laterality: Right;  .  Insertion of dialysis catheter N/A 03/29/2012    Procedure: INSERTION OF DIALYSIS CATHETER;  Surgeon: Rosetta Posner, MD;  Location: [place];  Service: Vascular;  Laterality: N/A;  Ultrasound guided  . Av fistula placement Left 05/03/2012    Procedure: INSERTION OF ARTERIOVENOUS (AV) GORE-TEX GRAFT THIGH USING 7mm X 70cm Gore-Tex Graft ;  Surgeon: Rosetta Posner, MD;  Location: [facility] OR;  Service: Vascular;  Laterality: Left;  . Transthoracic echocardiogram  08/27/2011    EF 35-40%, mild-moderate concetric hypertrophy of the LV, moderately reduced LV systolic function,    VITAL SIGNS BP 134/62  Pulse 82  Ht 5\' 5"  (1.651 m)  Wt 195 lb (88.451 kg)  BMI  32.45 kg/m2   Patient's Medications  New Prescriptions   No medications on file  Previous Medications   ABACAVIR (ZIAGEN) 300 MG TABLET    Take 600 mg by mouth daily at 12 noon.    ASPIRIN 81 MG CHEWABLE TABLET    Chew 81 mg by mouth daily at 12 noon.    B COMPLEX VITAMINS TABLET    Take 1 tablet by mouth daily at 12 noon.    CALCIUM ACETATE (PHOSLO) 667 MG CAPSULE    Take 2 capsules (1,334 mg total) by mouth 3 (three) times daily with meals.   DARUNAVIR ETHANOLATE (PREZISTA) 800 MG TABLET    Take 1 tablet (800 mg total) by mouth daily with breakfast.   ETHYL CHLORIDE SPRAY    Apply 1 application topically 3 (three) times a week. To graft for dialysis Tuesday, Thursday and Saturday   GUAIFENESIN (ROBITUSSIN) 100 MG/5ML SYRUP    Take 200 mg by mouth every 6 (six) hours as needed for cough or congestion.   LAMIVUDINE (EPIVIR) 10 MG/ML SOLUTION    Take 50 mg by mouth daily at 12 noon.   MIDODRINE (PROAMATINE) 10 MG TABLET    Take 10 mg by mouth 2 (two) times daily. At 630am and 10pm   MULTIPLE VITAMINS-MINERALS (CERTAGEN PO)    Take 1 tablet by mouth daily at 12 noon.   RALTEGRAVIR (ISENTRESS) 400 MG TABLET    Take 1 tablet (400 mg total) by mouth 2 (two) times daily.   RITONAVIR (NORVIR) 100 MG TABS    Take 1 tablet (100 mg total) by mouth daily with breakfast.  Modified Medications   No medications on file  Discontinued Medications   No medications on file    SIGNIFICANT DIAGNOSTIC EXAMS   LABS REVIEWED:   11-11-12: wbc 2.5; hgb 11.5; hct 33.2 ;mcv 104; plt 163      Review of Systems  Constitutional: Negative for malaise/fatigue.  Respiratory: Negative for cough and shortness of breath.   Cardiovascular: Negative for chest pain and palpitations.  Gastrointestinal: Negative for heartburn, abdominal pain and constipation.  Musculoskeletal: Negative for back pain and myalgias.  Skin: Negative.   Psychiatric/Behavioral: The patient is not nervous/anxious.      Physical Exam    Constitutional: She appears well-developed and well-nourished. No distress.  Neck: Neck supple. No JVD present.  Cardiovascular: Normal rate, regular rhythm and intact distal pulses.   Respiratory: Effort normal. No respiratory distress. She has no wheezes.  GI: Soft. Bowel sounds are normal. She exhibits no distension. There is no tenderness.  Musculoskeletal: She exhibits no edema.  Neurological: She is alert.  Skin: Skin is warm and dry. She is not diaphoretic.  Left thigh dialysis access       ASSESSMENT/ PLAN:  1. ESRD: will continue  hemodialysis three times weekly; will continue 1200 cc fluid restriction daily; will continue phoslo 667 mg (2) tabs three times daily; asa 81 mg daily will monitor   2. HIV: status is stable; will continue prezista 800 mg daily; norvir 100 mg daily; ziagen 600 mg daily epivir 50 mg daily will monitor   3.  Orthostatic Hypotension related to hemodialysis; will continue midodrine 10 mg twice daily and will monitor her status.        Ok Edwards NP [facility]  Contact [phone] Monday through Friday 8am- 5pm  After hours call [phone]

## 2013-05-04 ENCOUNTER — Other Ambulatory Visit: Payer: Self-pay | Admitting: Obstetrics & Gynecology

## 2013-05-04 DIAGNOSIS — R928 Other abnormal and inconclusive findings on diagnostic imaging of breast: Secondary | ICD-10-CM

## 2013-05-12 ENCOUNTER — Other Ambulatory Visit (HOSPITAL_BASED_OUTPATIENT_CLINIC_OR_DEPARTMENT_OTHER): Payer: Self-pay | Admitting: Internal Medicine

## 2013-05-12 DIAGNOSIS — R928 Other abnormal and inconclusive findings on diagnostic imaging of breast: Secondary | ICD-10-CM

## 2013-05-13 ENCOUNTER — Other Ambulatory Visit: Payer: Self-pay

## 2013-05-18 ENCOUNTER — Ambulatory Visit (INDEPENDENT_AMBULATORY_CARE_PROVIDER_SITE_OTHER): Payer: Medicaid Other | Admitting: Infectious Disease

## 2013-05-18 ENCOUNTER — Encounter: Payer: Self-pay | Admitting: Infectious Disease

## 2013-05-18 ENCOUNTER — Other Ambulatory Visit: Payer: Self-pay | Admitting: *Deleted

## 2013-05-18 VITALS — BP 134/78 | HR 79 | Temp 98.2°F | Ht 62.0 in | Wt 195.0 lb

## 2013-05-18 DIAGNOSIS — R7881 Bacteremia: Secondary | ICD-10-CM

## 2013-05-18 DIAGNOSIS — Z91199 Patient's noncompliance with other medical treatment and regimen due to unspecified reason: Secondary | ICD-10-CM

## 2013-05-18 DIAGNOSIS — A0472 Enterocolitis due to Clostridium difficile, not specified as recurrent: Secondary | ICD-10-CM

## 2013-05-18 DIAGNOSIS — A4902 Methicillin resistant Staphylococcus aureus infection, unspecified site: Secondary | ICD-10-CM

## 2013-05-18 DIAGNOSIS — B20 Human immunodeficiency virus [HIV] disease: Secondary | ICD-10-CM

## 2013-05-18 DIAGNOSIS — Z992 Dependence on renal dialysis: Secondary | ICD-10-CM

## 2013-05-18 DIAGNOSIS — Z9119 Patient's noncompliance with other medical treatment and regimen: Secondary | ICD-10-CM

## 2013-05-18 DIAGNOSIS — N186 End stage renal disease: Secondary | ICD-10-CM

## 2013-05-18 LAB — HEPATIC FUNCTION PANEL
ALT: <8 U/L (ref 0–35)
AST: 21 U/L (ref 0–37)
Albumin: 3.3 g/dL — ABNORMAL LOW (ref 3.5–5.2)
Alkaline Phosphatase: 135 U/L — ABNORMAL HIGH (ref 39–117)
Bilirubin, Direct: 0.1 mg/dL (ref 0.0–0.3)
Indirect Bilirubin: 0.4 mg/dL (ref 0.2–1.2)
Total Bilirubin: 0.5 mg/dL (ref 0.2–1.2)
Total Protein: 9 g/dL — ABNORMAL HIGH (ref 6.0–8.3)

## 2013-05-18 LAB — CBC WITH DIFFERENTIAL/PLATELET
Basophils Absolute: 0 10*3/uL (ref 0.0–0.1)
Basophils Relative: 0 % (ref 0–1)
Eosinophils Absolute: 0.3 10*3/uL (ref 0.0–0.7)
Eosinophils Relative: 7 % — ABNORMAL HIGH (ref 0–5)
HCT: 30.7 % — ABNORMAL LOW (ref 36.0–46.0)
Hemoglobin: 10.5 g/dL — ABNORMAL LOW (ref 12.0–15.0)
Lymphocytes Relative: 39 % (ref 12–46)
Lymphs Abs: 1.8 10*3/uL (ref 0.7–4.0)
MCH: 35.1 pg — ABNORMAL HIGH (ref 26.0–34.0)
MCHC: 34.2 g/dL (ref 30.0–36.0)
MCV: 102.7 fL — ABNORMAL HIGH (ref 78.0–100.0)
Monocytes Absolute: 0.6 10*3/uL (ref 0.1–1.0)
Monocytes Relative: 13 % — ABNORMAL HIGH (ref 3–12)
Neutro Abs: 1.9 10*3/uL (ref 1.7–7.7)
Neutrophils Relative %: 41 % — ABNORMAL LOW (ref 43–77)
Platelets: 211 10*3/uL (ref 150–400)
RBC: 2.99 MIL/uL — ABNORMAL LOW (ref 3.87–5.11)
RDW: 14.8 % (ref 11.5–15.5)
WBC: 4.6 10*3/uL (ref 4.0–10.5)

## 2013-05-18 LAB — LDL CHOLESTEROL, DIRECT: Direct LDL: 100 mg/dL — ABNORMAL HIGH

## 2013-05-18 LAB — RPR

## 2013-05-18 MED ORDER — CLONAZEPAM 0.5 MG PO TABS: ORAL_TABLET | ORAL | Status: DC

## 2013-05-18 NOTE — Telephone Encounter (Signed)
[facility] 

## 2013-05-18 NOTE — Progress Notes (Signed)
Subjective:    Patient ID: [name], female    DOB: 1955/09/19, 58 y.o.   MRN: [id number]  HPI   58 year old [name] with HIV currently perfectly controlled and ESRD on HD residing in [facility].   SHE HAD COMPLETELY OUT OF CONTROL VIREMIA IN January OF 2015  WITH VL > THIRTY THOUSAND ALLEGEDLY TAKING MEDS BUT CLEARLY NOT TAKING HER MEDS AS SHE HAD ZERO RESISTANCE MUTATIONS AT THAT TIME IN January.   WE CALLED SNF RN DIRECTOR AND HOPEFULY WE HAVE SOLVED THIS [place] MY CONFIDENCE IN [place] HAS TEMPORARILY BEEN SHAKEN   I saw her in April and she was still not fully suppressed with VL of 3k and tried to get genotype but it was not performed due to interfering substance?  I added in Isentress 400mg  bid and viread 300mg  weekly  She says she feels great today.  Review of Systems  Constitutional: Negative for fever, chills, diaphoresis, activity change, appetite change, fatigue and unexpected weight change.  HENT: Negative for congestion, rhinorrhea, sinus pressure, sneezing, sore throat and trouble swallowing.   Eyes: Negative for photophobia and visual disturbance.  Respiratory: Negative for cough, chest tightness, shortness of breath, wheezing and stridor.   Cardiovascular: Negative for chest pain, palpitations and leg swelling.  Gastrointestinal: Negative for nausea, vomiting, abdominal pain, diarrhea, constipation, blood in stool, abdominal distention and anal bleeding.  Genitourinary: Negative for dysuria, hematuria, flank pain and difficulty urinating.  Musculoskeletal: Negative for arthralgias, back pain, gait problem, joint swelling and myalgias.  Skin: Positive for wound. Negative for color change, pallor and rash.  Neurological: Negative for dizziness, tremors, weakness and light-headedness.  Hematological: Negative for adenopathy. Does not bruise/bleed easily.  Psychiatric/Behavioral: Negative for behavioral problems, confusion, sleep  disturbance, dysphoric mood, decreased concentration and agitation.       Objective:   Physical Exam  Constitutional: She is oriented to person, place, and time. No distress.  HENT:  Head: Normocephalic and atraumatic.  Mouth/Throat: Oropharynx is clear and moist. No oropharyngeal exudate.  Eyes: Conjunctivae and EOM are normal. Pupils are equal, round, and reactive to light. No scleral icterus.  Neck: Normal range of motion. Neck supple. No JVD present.  Cardiovascular: Normal rate, regular rhythm and normal heart sounds.  Exam reveals no gallop and no friction rub.   No murmur heard. Pulmonary/Chest: Effort normal and breath sounds normal. No respiratory distress. She has no wheezes. She has no rales. She exhibits no tenderness.  Abdominal: She exhibits no distension and no mass. There is no tenderness.  Musculoskeletal: She exhibits no edema and no tenderness.  Lymphadenopathy:    She has no cervical adenopathy.  Neurological: She is alert and oriented to person, place, and time. She has normal reflexes. She exhibits normal muscle tone. Coordination normal.  Skin: Skin is warm. She is not diaphoretic. No erythema. No pallor.     Psychiatric: She has a normal mood and affect. Her behavior is normal. Judgment and thought content normal.          Assessment & Plan:   HIV:    Continue current regimen of Prezista 800mg , Norvir 100mg  Isentress 400mg  BID, Viread 300 mg weekly, daily Epivir and Abacavir  CHECK VL AND CD4 TODAY  Check HIV Archive genosure with [facility] for knowledge of her full amount of resistance (in past largely only NNRTI resistance)  If no significant resistance adn with VL undetectable will likely drop her ABC   I spent greater  than 25 minutes with the patient including greater than 50% of time in face to face counsel of the patient and in coordination of their care.   MRSA bacteremia no recurrence since last February  Hx of C difficile colitis:  watch for recurrence, none so far.  Hypotension with HD: on midodrine stable  ESRD on HD: getting HD via graft in leg   Hyperlipidemia: check direct LDL

## 2013-05-19 LAB — HIV-1 RNA ULTRAQUANT REFLEX TO GENTYP+
HIV 1 RNA Quant: 96 copies/mL — ABNORMAL HIGH (ref <20)
HIV-1 RNA Quant, Log: 1.98 {Log} — ABNORMAL HIGH (ref <1.30)

## 2013-05-20 LAB — T-HELPER CELL (CD4) - (RCID CLINIC ONLY)
CD4 % Helper T Cell: 20 % — ABNORMAL LOW (ref 33–55)
CD4 T Cell Abs: 360 /uL — ABNORMAL LOW (ref 400–2700)

## 2013-05-25 ENCOUNTER — Encounter (INDEPENDENT_AMBULATORY_CARE_PROVIDER_SITE_OTHER): Payer: Self-pay

## 2013-05-25 ENCOUNTER — Ambulatory Visit
Admission: RE | Admit: 2013-05-25 | Discharge: 2013-05-25 | Disposition: A | Discharge status: Home / Self Care | Payer: Medicaid Other | Source: Ambulatory Visit | Attending: Internal Medicine | Admitting: Internal Medicine

## 2013-05-25 ENCOUNTER — Non-Acute Institutional Stay (SKILLED_NURSING_FACILITY): Payer: Medicaid Other | Admitting: Internal Medicine

## 2013-05-25 DIAGNOSIS — B20 Human immunodeficiency virus [HIV] disease: Secondary | ICD-10-CM

## 2013-05-25 DIAGNOSIS — R928 Other abnormal and inconclusive findings on diagnostic imaging of breast: Secondary | ICD-10-CM

## 2013-05-25 DIAGNOSIS — I951 Orthostatic hypotension: Secondary | ICD-10-CM

## 2013-05-25 DIAGNOSIS — N186 End stage renal disease: Secondary | ICD-10-CM

## 2013-05-26 NOTE — Progress Notes (Signed)
        PROGRESS NOTE  DATE: 05-25-13  FACILITY: [facility]  LEVEL OF CARE: SNF  Routine Visit  CHIEF COMPLAINT:  Manage AIDS, hyperphosphatemia  and orthostatic hypotension  HISTORY OF PRESENT ILLNESS:  REASSESSMENT OF ONGOING PROBLEM(S):  AIDS:  Patient denies any acute symptoms. She is tolerating her medications without any side effects. She is followed by infectious disease.  ORTHOSTATIC HYPOTENSION: The orthostatcic hypotension remains stable.  Pt is tolerating current medications without any adverse reactions.  No dizziness or lightheadedness reported.  HYPERPHOSPHATEMIA: Secondary to end-stage renal disease. She is on PhosLo and tolerates it without any side effects.  PAST MEDICAL HISTORY : Reviewed.  No changes.  CURRENT MEDICATIONS: Reviewed per [facility]  REVIEW OF SYSTEMS:  GENERAL: no change in appetite, no fatigue, no weight changes, no fever, chills or weakness RESPIRATORY: no cough, SOB, DOE, wheezing, hemoptysis CARDIAC: no chest pain, or palpitations, complains of bilateral lower extremity swelling GI: no abdominal pain, diarrhea, constipation, heart burn, nausea or vomiting  PHYSICAL EXAMINATION  VS: See vital signs section  GENERAL: no acute distress, obese body habitus EYES: Normal sclerae, normal conjunctivae, no discharge NECK: supple, trachea midline, no neck masses, no thyroid tenderness, no thyromegaly LYMPHATICS: No cervical lymphadenopathy, no supraclavicular lymphadenopathy RESPIRATORY: breathing is even & unlabored, BS CTAB CARDIAC: RRR, no murmur,no extra heart sounds, left lower extremity has +3 edema and right lower extremity has +2 edema GI: abdomen soft, normal BS, no masses, no tenderness, no hepatomegaly, no splenomegaly PSYCHIATRIC: the patient is alert & oriented to person, affect & behavior appropriate  LABS/RADIOLOGY:  11-14 WBC 2.5, hemoglobin 11.5, MCV 104, platelet 163  8-14 MCV 111 otherwise CBC normal, BUN 45, creatinine 6,  alkaline phosphatase 395, total protein 9 otherwise CMP normal, vitamin B12 level 1308, foloic acid level  greater than 19.9 2/14 hemoglobin 10.5, MCV 108 otherwise CBC normal, BUN 25, creatinine 5.3, total protein 8.9 otherwise CMP normal, CD4 330, RPR nonreactive   ASSESSMENT/PLAN:  orthostatic hypotension-continue midodrine. AIDS-continue current medications. Managed by infectious disease. end-stage renal disease-on hemodialysis. hyperphosphatemia-on PhosLo. anxiety-Klonopin was started Check CBC and CMP  CPT CODE: 40347  Edgar Frisk. Durwin Reges, [place] [phone]

## 2013-06-06 ENCOUNTER — Non-Acute Institutional Stay (SKILLED_NURSING_FACILITY): Payer: Medicaid Other | Admitting: Internal Medicine

## 2013-06-06 DIAGNOSIS — N039 Chronic nephritic syndrome with unspecified morphologic changes: Principal | ICD-10-CM

## 2013-06-06 DIAGNOSIS — D7589 Other specified diseases of blood and blood-forming organs: Secondary | ICD-10-CM

## 2013-06-06 DIAGNOSIS — D631 Anemia in chronic kidney disease: Secondary | ICD-10-CM

## 2013-06-09 DIAGNOSIS — D7589 Other specified diseases of blood and blood-forming organs: Secondary | ICD-10-CM | POA: Insufficient documentation

## 2013-06-09 NOTE — Progress Notes (Signed)
Patient ID: [name], female   DOB: April 19, 1955, 58 y.o.   MRN: [id number]            PROGRESS NOTE  DATE: 06/06/2013       FACILITY:  [facility]  LEVEL OF CARE: SNF (31)  Acute Visit  CHIEF COMPLAINT:  Manage anemia of chronic kidney disease and hypercalcemia.    HISTORY OF PRESENT ILLNESS: I was requested by the staff to assess the patient regarding above problem(s):  ANEMIA: The anemia has been stable. The patient denies fatigue, melena or hematochezia. No complications from the medications currently being used.  On 06/01/2013:  Hemoglobin 11, MCV 104.  In 11/2012:  Hemoglobin 11.5, MCV 104.   The patient's anemia is secondary to end-stage renal disease.    HYPERCALCEMIA:  New problem.  On 06/01/2013:  Calcium 10.5.  In 11/2012:  Calcium 9.8.   The patient is currently not on calcium supplementation.  She denies any neuromuscular symptoms.    PAST MEDICAL HISTORY : Reviewed.  No changes/see problem list  CURRENT MEDICATIONS: Reviewed per MAR/see medication list  REVIEW OF SYSTEMS:  GENERAL: no change in appetite, no fatigue, no weight changes, no fever, chills or weakness RESPIRATORY: no cough, SOB, DOE,, wheezing, hemoptysis CARDIAC: no chest pain or palpitations; complains of  chronic lower extremity swelling     GI: no abdominal pain, diarrhea, constipation, heart burn, nausea or vomiting  PHYSICAL EXAMINATION  VS:  T 97       P 73      RR 20      BP 119/70      WT (Lb) 192      GENERAL: no acute distress, moderately obese body habitus NECK: supple, trachea midline, no neck masses, no thyroid tenderness, no thyromegaly RESPIRATORY: breathing is even & unlabored, BS CTAB CARDIAC: RRR, no murmur,no extra heart sounds, +2 bilateral lower extremity edema    GI: abdomen soft, normal BS, no masses, no tenderness, no hepatomegaly, no splenomegaly PSYCHIATRIC: the patient is alert & oriented to person, affect & behavior appropriate  LABS/RADIOLOGY:     In 08/2012:  Vitamin B12 level 1308, folate greater than 19.9.    ASSESSMENT/PLAN:  Anemia of chronic kidney disease.  Hemoglobin declined.  We will monitor.    Macrocytosis.  RBC folate and B12 level normal.    Hypercalcemia.  New problem.  The patient is not on calcium supplementation.  She is a dialysis patient.  Therefore, we will monitor.    CPT CODE: 13086       Gayani Y Dasanayaka, [place] [phone]

## 2013-06-15 ENCOUNTER — Non-Acute Institutional Stay (SKILLED_NURSING_FACILITY): Payer: Medicaid Other | Admitting: Internal Medicine

## 2013-06-15 DIAGNOSIS — I951 Orthostatic hypotension: Secondary | ICD-10-CM

## 2013-06-15 DIAGNOSIS — B20 Human immunodeficiency virus [HIV] disease: Secondary | ICD-10-CM

## 2013-06-15 DIAGNOSIS — N186 End stage renal disease: Secondary | ICD-10-CM

## 2013-06-15 NOTE — Progress Notes (Addendum)
        PROGRESS NOTE  DATE: 06-15-13  FACILITY: [facility]  LEVEL OF CARE: SNF  Routine Visit  CHIEF COMPLAINT:  Manage AIDS, hyperphosphatemia  and orthostatic hypotension  HISTORY OF PRESENT ILLNESS:  REASSESSMENT OF ONGOING PROBLEM(S):  AIDS:  Patient denies any acute symptoms. She is tolerating her medications without any side effects. She is followed by infectious disease.  ORTHOSTATIC HYPOTENSION: The orthostatcic hypotension remains stable.  Pt is tolerating current medications without any adverse reactions.  No dizziness or lightheadedness reported.  HYPERPHOSPHATEMIA: Secondary to end-stage renal disease. She is on PhosLo and tolerates it without any side effects.  PAST MEDICAL HISTORY : Reviewed.  No changes.  CURRENT MEDICATIONS: Reviewed per [facility]  REVIEW OF SYSTEMS:  GENERAL: no change in appetite, no fatigue, no weight changes, no fever, chills or weakness RESPIRATORY: no cough, SOB, DOE, wheezing, hemoptysis CARDIAC: no chest pain, or palpitations, complains of bilateral lower extremity swelling GI: no abdominal pain, diarrhea, constipation, heart burn, nausea or vomiting  PHYSICAL EXAMINATION  VS: See vital signs section  GENERAL: no acute distress, obese body habitus NECK: supple, trachea midline, no neck masses, no thyroid tenderness, no thyromegaly RESPIRATORY: breathing is even & unlabored, BS CTAB CARDIAC: RRR, no murmur,no extra heart sounds, left lower extremity has +2edema and right lower extremity has +1 edema GI: abdomen soft, normal BS, no masses, no tenderness, no hepatomegaly, no splenomegaly PSYCHIATRIC: the patient is alert & oriented to person, affect & behavior appropriate  LABS/RADIOLOGY: 5-15 hemoglobin 11, MCV 104 otherwise CBC normal, creatinine 5.89, calcium 10.5, total protein 9.2, alkaline phosphatase 134 otherwise CMP normal 11-14 WBC 2.5, hemoglobin 11.5, MCV 104, platelet 163  8-14 MCV 111 otherwise CBC normal, BUN 45,  creatinine 6, alkaline phosphatase 395, total protein 9 otherwise CMP normal, vitamin B12 level 1308, foloic acid level  greater than 19.9 2/14 hemoglobin 10.5, MCV 108 otherwise CBC normal, BUN 25, creatinine 5.3, total protein 8.9 otherwise CMP normal, CD4 330, RPR nonreactive   ASSESSMENT/PLAN:  orthostatic hypotension-continue midodrine. AIDS-continue current medications. Managed by infectious disease. end-stage renal disease-on hemodialysis. hyperphosphatemia-on PhosLo. anxiety-stable Anemia of chronic kidney disease-stable  CPT CODE: 16109  Edgar Frisk. Durwin Reges, [place] [phone]

## 2013-06-27 ENCOUNTER — Ambulatory Visit: Payer: Self-pay | Admitting: Infectious Disease

## 2013-06-28 ENCOUNTER — Encounter: Payer: Self-pay | Admitting: *Deleted

## 2013-07-06 ENCOUNTER — Ambulatory Visit: Payer: Self-pay | Admitting: Infectious Disease

## 2013-07-07 ENCOUNTER — Ambulatory Visit: Payer: Self-pay | Admitting: Infectious Disease

## 2013-07-09 IMAGING — CR DG CHEST 1V PORT
1 series · 1 of 1 positions shown · non-contrast
Comparison: 07/11/2011

CLINICAL DATA: Chest pain for 2 days.  Fever.  Diaphoresis.

PORTABLE CHEST - 1 VIEW

[AP]
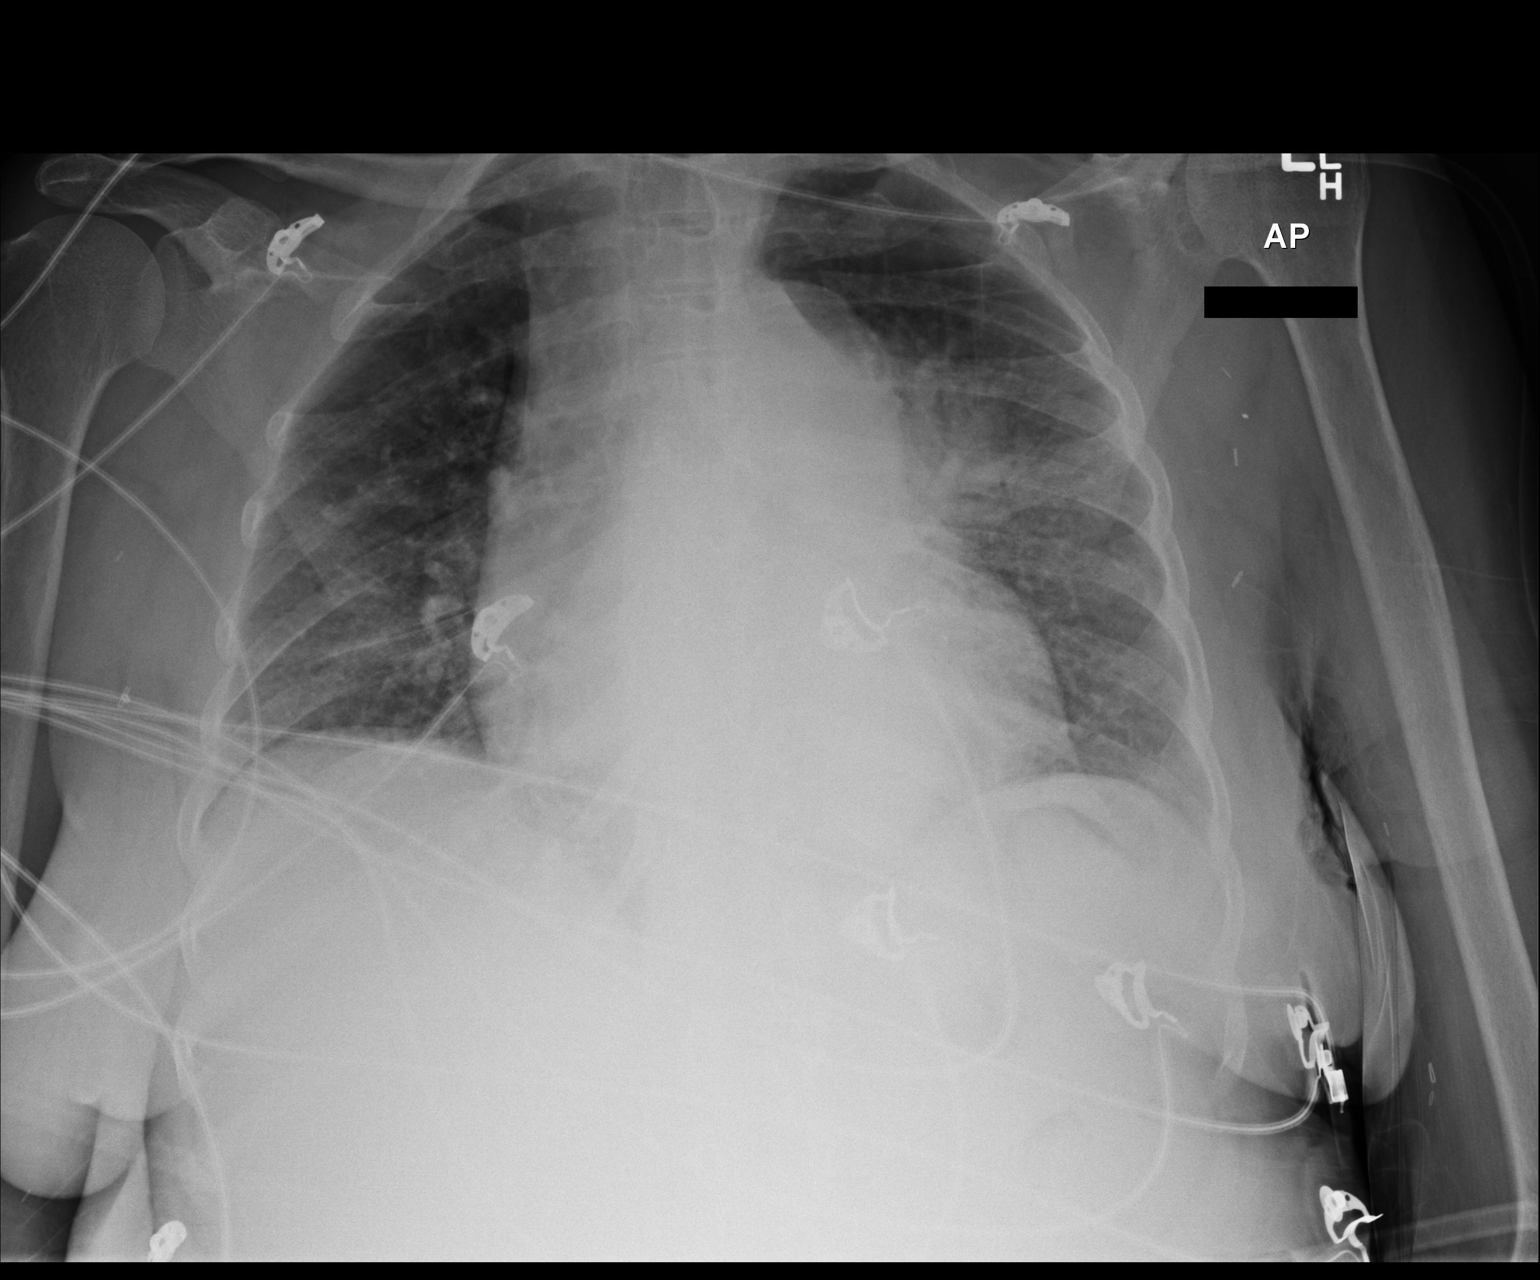

[1 of 1 positions shown; findings below may reference images not displayed]

FINDINGS: Shallow inspiration.  Cardiac enlargement with normal
pulmonary vascularity.  Left perihilar airspace infiltration
suggesting pneumonia.  No blunting of costophrenic angles.  No
pneumothorax.  Surgical clips in the left axilla.
IMPRESSION: Cardiac enlargement.  Airspace infiltration in the left mid lung
suggesting pneumonia.

## 2013-07-11 ENCOUNTER — Non-Acute Institutional Stay (SKILLED_NURSING_FACILITY): Payer: Medicaid Other | Admitting: Internal Medicine

## 2013-07-11 DIAGNOSIS — B20 Human immunodeficiency virus [HIV] disease: Secondary | ICD-10-CM

## 2013-07-11 DIAGNOSIS — N186 End stage renal disease: Secondary | ICD-10-CM

## 2013-07-11 DIAGNOSIS — I951 Orthostatic hypotension: Secondary | ICD-10-CM

## 2013-07-13 NOTE — Progress Notes (Signed)
        PROGRESS NOTE  DATE: 07-11-13  FACILITY: [facility]  LEVEL OF CARE: SNF  Routine Visit  CHIEF COMPLAINT:  Manage AIDS, hyperphosphatemia  and orthostatic hypotension  HISTORY OF PRESENT ILLNESS:  REASSESSMENT OF ONGOING PROBLEM(S):  AIDS:  Patient denies any acute symptoms. She is tolerating her medications without any side effects. She is followed by infectious disease.  ORTHOSTATIC HYPOTENSION: The orthostatcic hypotension remains stable.  Pt is tolerating current medications without any adverse reactions.  No dizziness or lightheadedness reported.  HYPERPHOSPHATEMIA: Secondary to end-stage renal disease. She is on PhosLo and tolerates it without any side effects.  PAST MEDICAL HISTORY : Reviewed.  No changes.  CURRENT MEDICATIONS: Reviewed per [facility]  REVIEW OF SYSTEMS:  GENERAL: no change in appetite, no fatigue, no weight changes, no fever, chills or weakness RESPIRATORY: no cough, SOB, DOE, wheezing, hemoptysis CARDIAC: no chest pain, or palpitations, complains of bilateral lower extremity swelling GI: no abdominal pain, diarrhea, constipation, heart burn, nausea or vomiting  PHYSICAL EXAMINATION  VS: See vital signs section  GENERAL: no acute distress, obese body habitus NECK: supple, trachea midline, no neck masses, no thyroid tenderness, no thyromegaly RESPIRATORY: breathing is even & unlabored, BS CTAB CARDIAC: RRR, no murmur,no extra heart sounds, left lower extremity has +2edema and right lower extremity has +1 edema GI: abdomen soft, normal BS, no masses, no tenderness, no hepatomegaly, no splenomegaly PSYCHIATRIC: the patient is alert & oriented to person, affect & behavior appropriate  LABS/RADIOLOGY: 5-15 hemoglobin 11, MCV 104 otherwise CBC normal, creatinine 5.89, calcium 10.5, total protein 9.2, alkaline phosphatase 134 otherwise CMP normal 11-14 WBC 2.5, hemoglobin 11.5, MCV 104, platelet 163  8-14 MCV 111 otherwise CBC normal, BUN 45,  creatinine 6, alkaline phosphatase 395, total protein 9 otherwise CMP normal, vitamin B12 level 1308, foloic acid level  greater than 19.9 2/14 hemoglobin 10.5, MCV 108 otherwise CBC normal, BUN 25, creatinine 5.3, total protein 8.9 otherwise CMP normal, CD4 330, RPR nonreactive   ASSESSMENT/PLAN:  orthostatic hypotension-continue midodrine. AIDS-continue current medications. Managed by infectious disease. end-stage renal disease-on hemodialysis. hyperphosphatemia-on PhosLo. anxiety-stable Anemia of chronic kidney disease-stable  CPT CODE: 16109  Edgar Frisk. Durwin Reges, [place] [phone]

## 2013-07-29 ENCOUNTER — Other Ambulatory Visit (HOSPITAL_COMMUNITY)
Admission: RE | Admit: 2013-07-29 | Discharge: 2013-07-29 | Disposition: A | Discharge status: Home / Self Care | Payer: Medicaid Other | Source: Ambulatory Visit | Attending: Infectious Diseases | Admitting: Infectious Diseases

## 2013-07-29 ENCOUNTER — Ambulatory Visit (INDEPENDENT_AMBULATORY_CARE_PROVIDER_SITE_OTHER): Payer: Medicaid Other | Admitting: *Deleted

## 2013-07-29 DIAGNOSIS — Z113 Encounter for screening for infections with a predominantly sexual mode of transmission: Secondary | ICD-10-CM

## 2013-07-29 DIAGNOSIS — Z01419 Encounter for gynecological examination (general) (routine) without abnormal findings: Secondary | ICD-10-CM | POA: Diagnosis present

## 2013-07-29 DIAGNOSIS — Z124 Encounter for screening for malignant neoplasm of cervix: Secondary | ICD-10-CM

## 2013-07-29 NOTE — Patient Instructions (Signed)
Your results will be ready  In about a week.  I will mail them to you.  Thank you for coming to the Center for your care.  Langley Gauss, RN

## 2013-07-29 NOTE — Progress Notes (Signed)
  Subjective:     [name] is a 58 y.o. woman who comes in today for a  pap smear only.  Previous abnormal Pap smears: yes, many years ago. Contraception: condoms.  Objective:    There were no vitals taken for this visit. Pelvic Exam:  Pap smear obtained.   Assessment:    Screening pap smear.   Plan:    Follow up in one year, or as indicated by Pap results.   Pt given educational materials re: HIV and women, self-esteem, BSE, nutrition and diet management, PAP smears and partner safety. Pt given condoms.

## 2013-08-01 ENCOUNTER — Encounter: Payer: Self-pay | Admitting: Infectious Disease

## 2013-08-01 ENCOUNTER — Non-Acute Institutional Stay (SKILLED_NURSING_FACILITY): Payer: Medicaid Other | Admitting: Internal Medicine

## 2013-08-01 ENCOUNTER — Other Ambulatory Visit: Payer: Self-pay | Admitting: *Deleted

## 2013-08-01 DIAGNOSIS — M25579 Pain in unspecified ankle and joints of unspecified foot: Secondary | ICD-10-CM

## 2013-08-01 DIAGNOSIS — M25571 Pain in right ankle and joints of right foot: Secondary | ICD-10-CM

## 2013-08-01 MED ORDER — HYDROCODONE-ACETAMINOPHEN 5-325 MG PO TABS: ORAL_TABLET | ORAL | Status: DC

## 2013-08-01 NOTE — Telephone Encounter (Signed)
[facility] 

## 2013-08-02 LAB — CYTOLOGY - PAP

## 2013-08-04 NOTE — Progress Notes (Signed)
Patient ID: [name], female   DOB: Jul 07, 1955, 58 y.o.   MRN: [id number]           PROGRESS NOTE  DATE: 08/01/2013        FACILITY:  [facility]  LEVEL OF CARE: SNF (31)  Acute Visit  CHIEF COMPLAINT:  Manage right foot pain.    HISTORY OF PRESENT ILLNESS: I was requested by the staff to assess the patient regarding above problem(s):  Patient is complaining of right foot pain and swelling.  She states that she hit the foot on the wheelchair yesterday and has been having pain since then.  She denies radiation, numbness or tingling.  There is no temporal relationship.    PAST MEDICAL HISTORY : Reviewed.  No changes/see problem list  CURRENT MEDICATIONS: Reviewed per MAR/see medication list  REVIEW OF SYSTEMS:  GENERAL: no change in appetite, no fatigue, no weight changes, no fever, chills or weakness RESPIRATORY: no cough, SOB, DOE,, wheezing, hemoptysis CARDIAC: no chest pain or palpitations, lower extremity swelling     GI: no abdominal pain, diarrhea, constipation, heart burn, nausea or vomiting  PHYSICAL EXAMINATION  VS: see VS section  GENERAL: no acute distress, moderately obese body habitus NECK: supple, trachea midline, no neck masses, no thyroid tenderness, no thyromegaly RESPIRATORY: breathing is even & unlabored, BS CTAB CARDIAC: RRR, no murmur,no extra heart sounds, right lower extremity has +1 edema, left lower extremity has +2 edema      GI: abdomen soft, normal BS, no masses, no tenderness, no hepatomegaly, no splenomegaly PSYCHIATRIC: the patient is alert & oriented to person, affect & behavior appropriate MUSCULOSKELETAL:  There is pain on palpation in the right second toe but there is no edema, warmth, or erythema.    ASSESSMENT/PLAN:  Right foot pain.  New problem.  Obtain right foot x-ray.  Start Norco 5/500, 1 tablet q.4 p.r.n.      CPT CODE: 16109          Gayani Y Dasanayaka, [place] [phone]

## 2013-08-05 ENCOUNTER — Encounter: Payer: Self-pay | Admitting: *Deleted

## 2013-08-31 ENCOUNTER — Ambulatory Visit (INDEPENDENT_AMBULATORY_CARE_PROVIDER_SITE_OTHER): Payer: Medicaid Other | Admitting: Infectious Disease

## 2013-08-31 ENCOUNTER — Telehealth: Payer: Self-pay | Admitting: *Deleted

## 2013-08-31 ENCOUNTER — Encounter: Payer: Self-pay | Admitting: Infectious Disease

## 2013-08-31 VITALS — BP 106/70 | HR 66 | Temp 98.4°F | Ht 64.0 in | Wt 202.0 lb

## 2013-08-31 DIAGNOSIS — B20 Human immunodeficiency virus [HIV] disease: Secondary | ICD-10-CM

## 2013-08-31 DIAGNOSIS — Z992 Dependence on renal dialysis: Secondary | ICD-10-CM

## 2013-08-31 DIAGNOSIS — Z23 Encounter for immunization: Secondary | ICD-10-CM

## 2013-08-31 DIAGNOSIS — A4902 Methicillin resistant Staphylococcus aureus infection, unspecified site: Secondary | ICD-10-CM

## 2013-08-31 DIAGNOSIS — N186 End stage renal disease: Secondary | ICD-10-CM

## 2013-08-31 DIAGNOSIS — R7881 Bacteremia: Secondary | ICD-10-CM

## 2013-08-31 DIAGNOSIS — I959 Hypotension, unspecified: Secondary | ICD-10-CM

## 2013-08-31 DIAGNOSIS — A0472 Enterocolitis due to Clostridium difficile, not specified as recurrent: Secondary | ICD-10-CM

## 2013-08-31 NOTE — Patient Instructions (Signed)
We will check labs today  IF they are re-assuring will drop your abacavir and have youcome back in 3 months  Does pt need SPAP

## 2013-08-31 NOTE — Telephone Encounter (Signed)
Clarified note from today's visit re: when to stop the abacavir.  Patient had labs drawn at today's appointment.  We will need to contact [facility] with any medication regimen changes per Dr. Lucianne Lei Dam's note.  Landis Gandy, RN

## 2013-08-31 NOTE — Progress Notes (Signed)
Subjective:    Patient ID: [name], female    DOB: September 11, 1955, 58 y.o.   MRN: [id number]  HPI   58 year old [name] with HIV currently perfectly controlled and ESRD on HD residing in [facility].   SHE HAD COMPLETELY OUT OF CONTROL VIREMIA IN January OF 2015  WITH VL > THIRTY THOUSAND ALLEGEDLY TAKING MEDS BUT CLEARLY NOT TAKING HER MEDS AS SHE HAD ZERO RESISTANCE MUTATIONS AT THAT TIME IN January.   WE CALLED SNF RN DIRECTOR to ensure this was rectified   I saw her in April and she was still not fully suppressed with VL of 3k and tried to get genotype but it was not performed due to interfering substance?  I added in Isentress 400mg  bid and viread 300mg  weekly  On followup her car loader dropped into the 90s and CD4 count was about 300  Lab Results  Component Value Date   HIV1RNAQUANT 96* 05/18/2013   Lab Results  Component Value Date   CD4TABS 360* 05/18/2013   CD4TABS 210* 01/17/2013   CD4TABS 320* 09/13/2012   Her Genosure archive showed resistance pattern below with a K103 causing R to EFV, M184V --> R to emtricitabine. Lamivudine though sensitizing the virus to tenofovir.      She feels well today without complaint  Review of Systems  Constitutional: Negative for fever, chills, diaphoresis, activity change, appetite change, fatigue and unexpected weight change.  HENT: Negative for congestion, rhinorrhea, sinus pressure, sneezing, sore throat and trouble swallowing.   Eyes: Negative for photophobia and visual disturbance.  Respiratory: Negative for cough, chest tightness, shortness of breath, wheezing and stridor.   Cardiovascular: Negative for chest pain, palpitations and leg swelling.  Gastrointestinal: Negative for nausea, vomiting, abdominal pain, diarrhea, constipation, blood in stool, abdominal distention and anal bleeding.  Genitourinary: Negative for dysuria, hematuria, flank pain and difficulty urinating.  Musculoskeletal: Negative  for arthralgias, back pain, gait problem, joint swelling and myalgias.  Skin: Positive for wound. Negative for color change, pallor and rash.  Neurological: Negative for dizziness, tremors, weakness and light-headedness.  Hematological: Negative for adenopathy. Does not bruise/bleed easily.  Psychiatric/Behavioral: Negative for behavioral problems, confusion, sleep disturbance, dysphoric mood, decreased concentration and agitation.       Objective:   Physical Exam  Constitutional: She is oriented to person, place, and time. No distress.  HENT:  Head: Normocephalic and atraumatic.  Mouth/Throat: Oropharynx is clear and moist. No oropharyngeal exudate.  Eyes: Conjunctivae and EOM are normal. Pupils are equal, round, and reactive to light. No scleral icterus.  Neck: Normal range of motion. Neck supple. No JVD present.  Cardiovascular: Normal rate, regular rhythm and normal heart sounds.  Exam reveals no gallop and no friction rub.   No murmur heard. Pulmonary/Chest: Effort normal and breath sounds normal. No respiratory distress. She has no wheezes. She has no rales. She exhibits no tenderness.  Abdominal: She exhibits no distension and no mass. There is no tenderness.  Musculoskeletal: She exhibits no edema and no tenderness.  Lymphadenopathy:    She has no cervical adenopathy.  Neurological: She is alert and oriented to person, place, and time. She has normal reflexes. She exhibits normal muscle tone. Coordination normal.  Skin: Skin is warm. She is not diaphoretic. No erythema. No pallor.  Psychiatric: She has a normal mood and affect. Her behavior is normal. Judgment and thought content normal.          Assessment & Plan:  HIV:  CHECK VL AND CD4 TODAY  If that looks good then we'll discontinue her abacavir  For now Continue current regimen of Prezista 800mg , Norvir 100mg  Isentress 400mg  BID, Viread 300 mg weekly, daily Epivir and Abacavir   I spent greater than 25 minutes  with the patient including greater than 50% of time in face to face counsel of the patient and in coordination of their care.   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  Hypotension with HD: on midodrine stable  ESRD on HD: getting HD via graft in leg

## 2013-09-01 LAB — T-HELPER CELL (CD4) - (RCID CLINIC ONLY)
CD4 % Helper T Cell: 17 % — ABNORMAL LOW (ref 33–55)
CD4 T Cell Abs: 280 /uL — ABNORMAL LOW (ref 400–2700)

## 2013-09-01 LAB — CBC WITH DIFFERENTIAL/PLATELET
Basophils Absolute: 0 10*3/uL (ref 0.0–0.1)
Basophils Relative: 0 % (ref 0–1)
Eosinophils Absolute: 0.2 10*3/uL (ref 0.0–0.7)
Eosinophils Relative: 4 % (ref 0–5)
HCT: 31.8 % — ABNORMAL LOW (ref 36.0–46.0)
Hemoglobin: 10.8 g/dL — ABNORMAL LOW (ref 12.0–15.0)
Lymphocytes Relative: 43 % (ref 12–46)
Lymphs Abs: 1.7 10*3/uL (ref 0.7–4.0)
MCH: 35.2 pg — ABNORMAL HIGH (ref 26.0–34.0)
MCHC: 34 g/dL (ref 30.0–36.0)
MCV: 103.6 fL — ABNORMAL HIGH (ref 78.0–100.0)
Monocytes Absolute: 0.4 10*3/uL (ref 0.1–1.0)
Monocytes Relative: 10 % (ref 3–12)
Neutro Abs: 1.7 10*3/uL (ref 1.7–7.7)
Neutrophils Relative %: 43 % (ref 43–77)
Platelets: 224 10*3/uL (ref 150–400)
RBC: 3.07 MIL/uL — ABNORMAL LOW (ref 3.87–5.11)
RDW: 15.2 % (ref 11.5–15.5)
WBC: 4 10*3/uL (ref 4.0–10.5)

## 2013-09-01 LAB — RPR

## 2013-09-05 ENCOUNTER — Non-Acute Institutional Stay (SKILLED_NURSING_FACILITY): Payer: Medicaid Other | Admitting: Internal Medicine

## 2013-09-05 DIAGNOSIS — B20 Human immunodeficiency virus [HIV] disease: Secondary | ICD-10-CM

## 2013-09-05 DIAGNOSIS — I951 Orthostatic hypotension: Secondary | ICD-10-CM

## 2013-09-05 DIAGNOSIS — N186 End stage renal disease: Secondary | ICD-10-CM

## 2013-09-05 LAB — HIV-1 RNA ULTRAQUANT REFLEX TO GENTYP+

## 2013-09-05 NOTE — Progress Notes (Signed)
        PROGRESS NOTE  DATE: 09-05-13  FACILITY: [facility]  LEVEL OF CARE: SNF  Routine Visit  CHIEF COMPLAINT:  Manage AIDS, hyperphosphatemia  and orthostatic hypotension  HISTORY OF PRESENT ILLNESS:  REASSESSMENT OF ONGOING PROBLEM(S):  AIDS:  Patient denies any acute symptoms. She is tolerating her medications without any side effects. She is followed by infectious disease.  ORTHOSTATIC HYPOTENSION: The orthostatcic hypotension remains stable.  Pt is tolerating current medications without any adverse reactions.  No dizziness or lightheadedness reported.  HYPERPHOSPHATEMIA: Secondary to end-stage renal disease. She is on PhosLo and tolerates it without any side effects.  PAST MEDICAL HISTORY : Reviewed.  No changes.  CURRENT MEDICATIONS: Reviewed per [facility]  REVIEW OF SYSTEMS:  GENERAL: no change in appetite, no fatigue, no weight changes, no fever, chills or weakness RESPIRATORY: no cough, SOB, DOE, wheezing, hemoptysis CARDIAC: no chest pain, or palpitations, complains of bilateral lower extremity swelling GI: no abdominal pain, diarrhea, constipation, heart burn, nausea or vomiting  PHYSICAL EXAMINATION  VS: See vital signs section  GENERAL: no acute distress, obese body habitus NECK: supple, trachea midline, no neck masses, no thyroid tenderness, no thyromegaly RESPIRATORY: breathing is even & unlabored, BS CTAB CARDIAC: RRR, no murmur,no extra heart sounds, left lower extremity has +2edema and right lower extremity has +1 edema GI: abdomen soft, normal BS, no masses, no tenderness, no hepatomegaly, no splenomegaly PSYCHIATRIC: the patient is alert & oriented to person, affect & behavior appropriate  LABS/RADIOLOGY: 8-15 MCV 104 otherwise CBC normal, glucose 100, BUN 29, creatinine 5.79, total protein 9.6, alkaline phosphatase 197 otherwise CMP normal 5-15 hemoglobin 11, MCV 104 otherwise CBC normal, creatinine 5.89, calcium 10.5, total protein 9.2, alkaline  phosphatase 134 otherwise CMP normal 11-14 WBC 2.5, hemoglobin 11.5, MCV 104, platelet 163  8-14 MCV 111 otherwise CBC normal, BUN 45, creatinine 6, alkaline phosphatase 395, total protein 9 otherwise CMP normal, vitamin B12 level 1308, foloic acid level  greater than 19.9 2/14 hemoglobin 10.5, MCV 108 otherwise CBC normal, BUN 25, creatinine 5.3, total protein 8.9 otherwise CMP normal, CD4 330, RPR nonreactive   ASSESSMENT/PLAN:  orthostatic hypotension-continue midodrine. AIDS-continue current medications. Managed by infectious disease. end-stage renal disease-on hemodialysis. hyperphosphatemia-on PhosLo. anxiety-stable Anemia of chronic kidney disease-stable  CPT CODE: 16109  [name]. [name], [name] [phone]

## 2013-09-09 LAB — HIV-1 INTEGRASE GENOTYPE

## 2013-09-23 ENCOUNTER — Non-Acute Institutional Stay (SKILLED_NURSING_FACILITY): Payer: Medicaid Other | Admitting: Internal Medicine

## 2013-09-23 DIAGNOSIS — B20 Human immunodeficiency virus [HIV] disease: Secondary | ICD-10-CM

## 2013-09-23 DIAGNOSIS — I951 Orthostatic hypotension: Secondary | ICD-10-CM

## 2013-09-23 DIAGNOSIS — N186 End stage renal disease: Secondary | ICD-10-CM

## 2013-09-23 NOTE — Progress Notes (Signed)
        PROGRESS NOTE  DATE: 09-23-13  FACILITY: [facility]  LEVEL OF CARE: SNF  Routine Visit  CHIEF COMPLAINT:  Manage AIDS, hyperphosphatemia  and orthostatic hypotension  HISTORY OF PRESENT ILLNESS:  REASSESSMENT OF ONGOING PROBLEM(S):  AIDS:  Patient denies any acute symptoms. She is tolerating her medications without any side effects. She is followed by infectious disease.  ORTHOSTATIC HYPOTENSION: The orthostatcic hypotension remains stable.  Pt is tolerating current medications without any adverse reactions.  No dizziness or lightheadedness reported.  HYPERPHOSPHATEMIA: Secondary to end-stage renal disease. She is on PhosLo and tolerates it without any side effects.  PAST MEDICAL HISTORY : Reviewed.  No changes.  CURRENT MEDICATIONS: Reviewed per [facility]  REVIEW OF SYSTEMS:  GENERAL: no change in appetite, no fatigue, no weight changes, no fever, chills or weakness RESPIRATORY: no cough, SOB, DOE, wheezing, hemoptysis CARDIAC: no chest pain, or palpitations, complains of bilateral lower extremity swelling GI: no abdominal pain, diarrhea, constipation, heart burn, nausea or vomiting  PHYSICAL EXAMINATION  VS: See vital signs section  GENERAL: no acute distress, obese body habitus NECK: supple, trachea midline, no neck masses, no thyroid tenderness, no thyromegaly RESPIRATORY: breathing is even & unlabored, BS CTAB CARDIAC: RRR, no murmur,no extra heart sounds, left lower extremity has +2edema and right lower extremity has +1 edema GI: abdomen soft, normal BS, no masses, no tenderness, no hepatomegaly, no splenomegaly PSYCHIATRIC: the patient is alert & oriented to person, affect & behavior appropriate  LABS/RADIOLOGY: 8-15 MCV 104 otherwise CBC normal, glucose 100, BUN 29, creatinine 5.79, total protein 9.6, alkaline phosphatase 197 otherwise CMP normal 5-15 hemoglobin 11, MCV 104 otherwise CBC normal, creatinine 5.89, calcium 10.5, total protein 9.2, alkaline  phosphatase 134 otherwise CMP normal 11-14 WBC 2.5, hemoglobin 11.5, MCV 104, platelet 163  8-14 MCV 111 otherwise CBC normal, BUN 45, creatinine 6, alkaline phosphatase 395, total protein 9 otherwise CMP normal, vitamin B12 level 1308, foloic acid level  greater than 19.9 2/14 hemoglobin 10.5, MCV 108 otherwise CBC normal, BUN 25, creatinine 5.3, total protein 8.9 otherwise CMP normal, CD4 330, RPR nonreactive   ASSESSMENT/PLAN:  orthostatic hypotension-continue midodrine. AIDS-continue current medications. Managed by infectious disease. end-stage renal disease-on hemodialysis. hyperphosphatemia-on PhosLo. anxiety-stable Anemia of chronic kidney disease-stable  CPT CODE: 91478  Edgar Frisk. Durwin Reges, [place] [phone]

## 2013-09-30 ENCOUNTER — Non-Acute Institutional Stay (SKILLED_NURSING_FACILITY): Payer: Medicaid Other | Admitting: Internal Medicine

## 2013-09-30 DIAGNOSIS — R509 Fever, unspecified: Secondary | ICD-10-CM

## 2013-09-30 DIAGNOSIS — B20 Human immunodeficiency virus [HIV] disease: Secondary | ICD-10-CM

## 2013-10-04 NOTE — Progress Notes (Addendum)
Patient ID: [name], female   DOB: 1955-12-22, 58 y.o.   MRN: [id number]               PROGRESS NOTE  DATE:  09/30/2013      FACILITY: [facility]    LEVEL OF CARE:   SNF   Acute Visit   CHIEF COMPLAINT:  Not feeling well, low-grade fever.    HISTORY OF PRESENT ILLNESS:  Apparently, the patient complained of chills and aching all over earlier today.  She was found to have a temperature of 100.4.   Her roommate had an upper respiratory tract infection.  The patient feels that she is a bit "stuffy".   She denies any sore throat, cough, shortness of breath, abdominal pain, or diarrhea (has a history of C.diff).  She states she is eating and drinking well, dialysis is going well.    I have also discussed with her today the issue of her HIV medications.  I episodically receive reports from Infectious Disease,  Dr. Tommy Medal even though I have not been her primary MD and have had little to do with her care.  Apparently, she had a significant viral load in January 2015 with greater than 30,000 copies/mL HIV-1 RNA.  This was repeated in April at 3008 and again in May at 96.  She did not have any resistance mutation at that time in January.  There was a suggestion that she was not taking her medications.  I have actually reviewed her MARs for August and September.  She is listed as taking all of her medications in September, including all her HIV medications.  However, in August, there were at least five to six occasions where she does not appear to have taken her medications.  I have spoken to her about this and emphasized the need to be completely compliant with her medical regimen.  I will ask the staff to be vigilant and leave [place] notes if she appears to be not taking her medications. The patient for her part denies refusing any of her medications. I will speak to our consultant pharmacist about this issue.    REVIEW OF SYSTEMS:   CHEST/RESPIRATORY:  No cough.  No sputum.   CARDIAC:   No chest  pain.   GI:  No diarrhea.   GU:  She does not void.    PHYSICAL EXAMINATION:   GENERAL APPEARANCE:  The patient does not look to be in any distress.   CHEST/RESPIRATORY:  Clear air entry bilaterally.   CARDIOVASCULAR:  CARDIAC:   Heart sounds are normal.   GASTROINTESTINAL:  ABDOMEN:   Soft.   LIVER/SPLEEN/KIDNEYS:  No liver, no spleen.   SKIN:  INSPECTION:   Her shunt is in her left medial thigh.  There does not appear to be any evidence of an infection here.    ASSESSMENT/PLAN:  Low-grade temperature.  This might be viral.  I will put her on surveillance vitals for the next 72 hours.   I do not see an obvious cause of this.    HIV issues with regards to medication compliance.   I have not been following the patient in this facility.  However, I do get the consultant notes and I have emphasized to her the need to be compliant which she claims she is.  I will write orders that the staff notify us if she is noncompliant with her medications, especially her medications for HIV.  I have/will speak to the consultant pharmacist about  this issue to see if there is another angle to this I am not seeing.

## 2013-10-10 ENCOUNTER — Non-Acute Institutional Stay (SKILLED_NURSING_FACILITY): Payer: Medicaid Other | Admitting: Internal Medicine

## 2013-10-10 DIAGNOSIS — R059 Cough, unspecified: Secondary | ICD-10-CM

## 2013-10-10 DIAGNOSIS — R05 Cough: Secondary | ICD-10-CM

## 2013-10-12 ENCOUNTER — Non-Acute Institutional Stay (SKILLED_NURSING_FACILITY): Payer: Medicaid Other | Admitting: Internal Medicine

## 2013-10-12 DIAGNOSIS — J189 Pneumonia, unspecified organism: Secondary | ICD-10-CM

## 2013-10-13 NOTE — Progress Notes (Signed)
Patient ID: [name], female   DOB: 1955/10/10, 58 y.o.   MRN: [id number]           PROGRESS NOTE  DATE: 10/10/2013          FACILITY:  [facility]  LEVEL OF CARE: SNF (31)  Acute Visit  CHIEF COMPLAINT:  Manage cough.    HISTORY OF PRESENT ILLNESS: I was requested by the staff to assess the patient regarding above problem(s):  Patient is complaining of a dry cough going on for two weeks, but denies shortness of breath, fever, chills or night sweats.  She cannot identify precipitating or alleviating factors.  There is no temporal relationship.    PAST MEDICAL HISTORY : Reviewed.  No changes/see problem list  CURRENT MEDICATIONS: Reviewed per MAR/see medication list  REVIEW OF SYSTEMS:  GENERAL: no change in appetite, no fatigue, no weight changes, no fever, chills or weakness RESPIRATORY: see HPI     CARDIAC: no chest pain or palpitations;  chronic lower extremity swelling    GI: no abdominal pain, diarrhea, constipation, heart burn, nausea or vomiting  PHYSICAL EXAMINATION  VS: see VS section  GENERAL: no acute distress, moderately obese body habitus EYES: conjunctivae normal, sclerae normal, normal eye lids NECK: supple, trachea midline, no neck masses, no thyroid tenderness, no thyromegaly LYMPHATICS: no LAN in the neck, no supraclavicular LAN RESPIRATORY: breathing is even & unlabored, BS CTAB CARDIAC: RRR, no murmur,no extra heart sounds, +2 bilateral lower extremity edema      ASSESSMENT/PLAN:  Cough.  New problem.  Not sure whether symptoms have been present for two weeks.  No such report given by staff.  We will obtain a chest x-ray.  Start Mucinex 600 mg b.i.d. for one week.    CPT CODE: 13086            [name], [place] [phone]

## 2013-10-19 NOTE — Progress Notes (Signed)
Patient ID: [name], female   DOB: October 09, 1955, 58 y.o.   MRN: [id number]           PROGRESS NOTE  DATE: 10/12/2013           FACILITY:  [facility]  LEVEL OF CARE: SNF (31)  Acute Visit  CHIEF COMPLAINT:  Manage pneumonia.    HISTORY OF PRESENT ILLNESS: I was requested by the staff to assess the patient regarding above problem(s):  Due to cough, patient had a chest x-ray done on 10/10/2013 which shows right lower lung infiltrate.  Patient does admit to ongoing cough, but denies shortness of breath, fever, chills or night sweats.    PAST MEDICAL HISTORY : Reviewed.  No changes/see problem list  CURRENT MEDICATIONS: Reviewed per MAR/see medication list  REVIEW OF SYSTEMS:  GENERAL: no change in appetite, no fatigue, no weight changes, no fever, chills or weakness RESPIRATORY: see HPI     CARDIAC: no chest pain or palpitations;  chronic lower extremity swelling      GI: no abdominal pain, diarrhea, constipation, heart burn, nausea or vomiting  PHYSICAL EXAMINATION  VS: see VS section  GENERAL: no acute distress, morbidly obese body habitus EYES: conjunctivae normal, sclerae normal, normal eye lids NECK: supple, trachea midline, no neck masses, no thyroid tenderness, no thyromegaly LYMPHATICS: no LAN in the neck, no supraclavicular LAN RESPIRATORY: decreased breath sounds in the right lower lung; breathing is even & unlabored, BS CTAB CARDIAC: RRR, no murmur,no extra heart sounds, +2 bilateral lower extremity edema     GI: abdomen soft, normal BS, no masses, no tenderness, no hepatomegaly, no splenomegaly PSYCHIATRIC: the patient is alert & oriented to person, affect & behavior appropriate  ASSESSMENT/PLAN:  Right lower lung pneumonia.  New onset.  Significant problem.   Avelox 400 mg q.d. for seven days and probiotics b.i.d. for 10 days started.    CPT CODE: 13086          [name] Y Ayven P[place], [place] [phone]

## 2013-10-26 ENCOUNTER — Non-Acute Institutional Stay (SKILLED_NURSING_FACILITY): Payer: Medicaid Other | Admitting: Internal Medicine

## 2013-10-26 DIAGNOSIS — N186 End stage renal disease: Secondary | ICD-10-CM

## 2013-10-26 DIAGNOSIS — I951 Orthostatic hypotension: Secondary | ICD-10-CM

## 2013-10-26 DIAGNOSIS — Z992 Dependence on renal dialysis: Secondary | ICD-10-CM

## 2013-10-26 DIAGNOSIS — B20 Human immunodeficiency virus [HIV] disease: Secondary | ICD-10-CM

## 2013-10-29 NOTE — Progress Notes (Signed)
        PROGRESS NOTE  DATE: 10-26-13  FACILITY: [facility]  LEVEL OF CARE: SNF  Routine Visit  CHIEF COMPLAINT:  Manage AIDS, hyperphosphatemia  and orthostatic hypotension  HISTORY OF PRESENT ILLNESS:  REASSESSMENT OF ONGOING PROBLEM(S):  AIDS:  Patient denies any acute symptoms. She is tolerating her medications without any side effects. She is followed by infectious disease.  ORTHOSTATIC HYPOTENSION: The orthostatcic hypotension remains stable.  Pt is tolerating current medications without any adverse reactions.  No dizziness or lightheadedness reported.  HYPERPHOSPHATEMIA: Secondary to end-stage renal disease. She is on PhosLo and tolerates it without any side effects.  PAST MEDICAL HISTORY : Reviewed.  No changes.  CURRENT MEDICATIONS: Reviewed per [facility]  REVIEW OF SYSTEMS:  GENERAL: no change in appetite, no fatigue, no weight changes, no fever, chills or weakness RESPIRATORY: no cough, SOB, DOE, wheezing, hemoptysis CARDIAC: no chest pain, or palpitations, complains of bilateral lower extremity swelling GI: no abdominal pain, diarrhea, constipation, heart burn, nausea or vomiting  PHYSICAL EXAMINATION  VS: See vital signs section  GENERAL: no acute distress, obese body habitus NECK: supple, trachea midline, no neck masses, no thyroid tenderness, no thyromegaly RESPIRATORY: breathing is even & unlabored, BS CTAB CARDIAC: RRR, no murmur,no extra heart sounds, left lower extremity has +2edema and right lower extremity has +1 edema GI: abdomen soft, normal BS, no masses, no tenderness, no hepatomegaly, no splenomegaly PSYCHIATRIC: the patient is alert & oriented to person, affect & behavior appropriate  LABS/RADIOLOGY: 8-15 MCV 104 otherwise CBC normal, glucose 100, BUN 29, creatinine 5.79, total protein 9.6, alkaline phosphatase 197 otherwise CMP normal 5-15 hemoglobin 11, MCV 104 otherwise CBC normal, creatinine 5.89, calcium 10.5, total protein 9.2, alkaline  phosphatase 134 otherwise CMP normal 11-14 WBC 2.5, hemoglobin 11.5, MCV 104, platelet 163  8-14 MCV 111 otherwise CBC normal, BUN 45, creatinine 6, alkaline phosphatase 395, total protein 9 otherwise CMP normal, vitamin B12 level 1308, foloic acid level  greater than 19.9 2/14 hemoglobin 10.5, MCV 108 otherwise CBC normal, BUN 25, creatinine 5.3, total protein 8.9 otherwise CMP normal, CD4 330, RPR nonreactive   ASSESSMENT/PLAN:  orthostatic hypotension-continue midodrine. AIDS-continue current medications. Managed by infectious disease. end-stage renal disease-on hemodialysis. hyperphosphatemia-on PhosLo. anxiety-stable Anemia of chronic kidney disease-stable  CPT CODE: 09811  Edgar Frisk. Durwin Reges, [place] [phone]

## 2013-11-28 ENCOUNTER — Ambulatory Visit: Payer: Self-pay | Admitting: Infectious Disease

## 2013-12-07 ENCOUNTER — Ambulatory Visit (INDEPENDENT_AMBULATORY_CARE_PROVIDER_SITE_OTHER): Payer: Medicaid Other | Admitting: Infectious Disease

## 2013-12-07 ENCOUNTER — Encounter: Payer: Self-pay | Admitting: Infectious Disease

## 2013-12-07 VITALS — BP 104/67 | HR 66 | Temp 98.1°F | Wt 198.0 lb

## 2013-12-07 DIAGNOSIS — Z992 Dependence on renal dialysis: Secondary | ICD-10-CM

## 2013-12-07 DIAGNOSIS — B9562 Methicillin resistant Staphylococcus aureus infection as the cause of diseases classified elsewhere: Secondary | ICD-10-CM | POA: Insufficient documentation

## 2013-12-07 DIAGNOSIS — Z113 Encounter for screening for infections with a predominantly sexual mode of transmission: Secondary | ICD-10-CM

## 2013-12-07 DIAGNOSIS — R7881 Bacteremia: Secondary | ICD-10-CM | POA: Insufficient documentation

## 2013-12-07 DIAGNOSIS — N186 End stage renal disease: Secondary | ICD-10-CM

## 2013-12-07 DIAGNOSIS — B20 Human immunodeficiency virus [HIV] disease: Secondary | ICD-10-CM

## 2013-12-07 LAB — RPR

## 2013-12-07 NOTE — Progress Notes (Signed)
Subjective:    Patient ID: [name], female    DOB: Jul 08, 1955, 58 y.o.   MRN: [id number]  HPI   58 year old [name] with HIV currently perfectly controlled and ESRD on HD residing in [facility].   SHE HAD COMPLETELY OUT OF CONTROL VIREMIA IN January OF 2015  WITH VL > THIRTY THOUSAND ALLEGEDLY TAKING MEDS BUT CLEARLY NOT TAKING HER MEDS AS SHE HAD ZERO RESISTANCE MUTATIONS AT THAT TIME IN January.   WE CALLED SNF RN DIRECTOR to ensure this was rectified   I saw her in April and she was still not fully suppressed with VL of 3k and tried to get genotype but it was not performed due to interfering substance?  I added in Isentress 400mg  bid and viread 300mg  weekly  On followup her car loader dropped into the 90s and CD4 count was about 300  Lab Results  Component Value Date   HIV1RNAQUANT CANCELED 08/31/2013   Lab Results  Component Value Date   CD4TABS 280* 08/31/2013   CD4TABS 360* 05/18/2013   CD4TABS 210* 01/17/2013   Her Genosure archive showed resistance pattern below with a K103 causing R to EFV, M184V --> R to emtricitabine. Lamivudine though sensitizing the virus to tenofovir.      Patient had labs done last time but viral load could not be performed for some reason. CD4 was healthy.  Lab Results  Component Value Date   HIV1RNAQUANT CANCELED 08/31/2013   Lab Results  Component Value Date   CD4TABS 280* 08/31/2013   CD4TABS 360* 05/18/2013   CD4TABS 210* 01/17/2013     Review of Systems  Constitutional: Negative for fever, chills, diaphoresis, activity change, appetite change, fatigue and unexpected weight change.  HENT: Negative for congestion, rhinorrhea, sinus pressure, sneezing, sore throat and trouble swallowing.   Eyes: Negative for photophobia and visual disturbance.  Respiratory: Negative for cough, chest tightness, shortness of breath, wheezing and stridor.   Cardiovascular: Negative for chest pain, palpitations and  leg swelling.  Gastrointestinal: Negative for nausea, vomiting, abdominal pain, diarrhea, constipation, blood in stool, abdominal distention and anal bleeding.  Genitourinary: Negative for dysuria, hematuria, flank pain and difficulty urinating.  Musculoskeletal: Negative for myalgias, back pain, joint swelling, arthralgias and gait problem.  Skin: Negative for color change, pallor and rash.  Neurological: Negative for dizziness, tremors, weakness and light-headedness.  Hematological: Negative for adenopathy. Does not bruise/bleed easily.  Psychiatric/Behavioral: Negative for behavioral problems, confusion, sleep disturbance, dysphoric mood, decreased concentration and agitation.       Objective:   Physical Exam  Constitutional: She is oriented to person, place, and time. She appears well-developed and well-nourished. No distress.  HENT:  Head: Normocephalic and atraumatic.  Mouth/Throat: Oropharynx is clear and moist. No oropharyngeal exudate.  Eyes: Conjunctivae and EOM are normal. No scleral icterus.  Neck: Normal range of motion. Neck supple. No JVD present.  Cardiovascular: Normal rate and regular rhythm.   Pulmonary/Chest: Effort normal. No respiratory distress. She has no wheezes.  Abdominal: She exhibits no distension.  Musculoskeletal: She exhibits no edema or tenderness.  Lymphadenopathy:    She has no cervical adenopathy.  Neurological: She is alert and oriented to person, place, and time. She exhibits normal muscle tone. Coordination normal.  Skin: Skin is warm and dry. She is not diaphoretic. No erythema. No pallor.  Psychiatric: She has a normal mood and affect. Her behavior is normal. Thought content normal.  Assessment & Plan:   HIV:  CHECK VL AND CD4 TODAY  Continue current regimen of Prezista 800mg , Norvir 100mg  Isentress 400mg  BID, Viread 300 mg weekly, daily Epivir and Abacavir    MRSA bacteremia no recurrence since last February  Hx of C  difficile colitis: watch for recurrence, none so far.  Hypotension with HD: on midodrine stable  ESRD on HD: getting HD via graft in leg

## 2013-12-08 LAB — T-HELPER CELL (CD4) - (RCID CLINIC ONLY)
CD4 % Helper T Cell: 18 % — ABNORMAL LOW (ref 33–55)
CD4 T Cell Abs: 370 /uL — ABNORMAL LOW (ref 400–2700)

## 2013-12-08 LAB — HIV-1 RNA ULTRAQUANT REFLEX TO GENTYP+
HIV 1 RNA Quant: 54 copies/mL — ABNORMAL HIGH (ref <20)
HIV-1 RNA Quant, Log: 1.73 {Log} — ABNORMAL HIGH (ref <1.30)

## 2013-12-08 LAB — HEPATITIS C ANTIBODY: HCV Ab: NEGATIVE

## 2013-12-13 ENCOUNTER — Other Ambulatory Visit: Payer: Self-pay | Admitting: Internal Medicine

## 2013-12-13 DIAGNOSIS — N6310 Unspecified lump in the right breast, unspecified quadrant: Secondary | ICD-10-CM

## 2013-12-14 ENCOUNTER — Encounter (HOSPITAL_COMMUNITY): Payer: Self-pay | Admitting: Vascular Surgery

## 2013-12-16 LAB — HIV-1 INTEGRASE GENOTYPE

## 2013-12-28 ENCOUNTER — Ambulatory Visit
Admission: RE | Admit: 2013-12-28 | Discharge: 2013-12-28 | Disposition: A | Discharge status: Home / Self Care | Payer: Medicaid Other | Source: Ambulatory Visit | Attending: Internal Medicine | Admitting: Internal Medicine

## 2013-12-28 DIAGNOSIS — N6310 Unspecified lump in the right breast, unspecified quadrant: Secondary | ICD-10-CM

## 2014-01-19 ENCOUNTER — Encounter (HOSPITAL_COMMUNITY): Payer: Self-pay | Admitting: Vascular Surgery

## 2014-02-21 ENCOUNTER — Other Ambulatory Visit: Payer: Self-pay | Admitting: Internal Medicine

## 2014-02-21 DIAGNOSIS — N6310 Unspecified lump in the right breast, unspecified quadrant: Secondary | ICD-10-CM

## 2014-03-23 ENCOUNTER — Inpatient Hospital Stay (HOSPITAL_COMMUNITY)
Admission: EM | Admit: 2014-03-23 | Discharge: 2014-03-27 | DRG: 974 | Disposition: A | Discharge status: Home / Self Care | Payer: Medicaid Other | Attending: Internal Medicine | Admitting: Internal Medicine

## 2014-03-23 ENCOUNTER — Emergency Department (HOSPITAL_COMMUNITY): Payer: Medicaid Other

## 2014-03-23 ENCOUNTER — Encounter (HOSPITAL_COMMUNITY): Payer: Self-pay | Admitting: Nurse Practitioner

## 2014-03-23 ENCOUNTER — Other Ambulatory Visit (HOSPITAL_COMMUNITY): Payer: Self-pay

## 2014-03-23 DIAGNOSIS — I120 Hypertensive chronic kidney disease with stage 5 chronic kidney disease or end stage renal disease: Secondary | ICD-10-CM | POA: Diagnosis present

## 2014-03-23 DIAGNOSIS — I9589 Other hypotension: Secondary | ICD-10-CM | POA: Diagnosis present

## 2014-03-23 DIAGNOSIS — Z880 Allergy status to penicillin: Secondary | ICD-10-CM

## 2014-03-23 DIAGNOSIS — Z7982 Long term (current) use of aspirin: Secondary | ICD-10-CM | POA: Diagnosis not present

## 2014-03-23 DIAGNOSIS — F419 Anxiety disorder, unspecified: Secondary | ICD-10-CM | POA: Diagnosis present

## 2014-03-23 DIAGNOSIS — N186 End stage renal disease: Secondary | ICD-10-CM

## 2014-03-23 DIAGNOSIS — J9601 Acute respiratory failure with hypoxia: Secondary | ICD-10-CM | POA: Diagnosis present

## 2014-03-23 DIAGNOSIS — Z6830 Body mass index (BMI) 30.0-30.9, adult: Secondary | ICD-10-CM

## 2014-03-23 DIAGNOSIS — D649 Anemia, unspecified: Secondary | ICD-10-CM | POA: Diagnosis present

## 2014-03-23 DIAGNOSIS — I5022 Chronic systolic (congestive) heart failure: Secondary | ICD-10-CM | POA: Diagnosis present

## 2014-03-23 DIAGNOSIS — Z992 Dependence on renal dialysis: Secondary | ICD-10-CM

## 2014-03-23 DIAGNOSIS — B20 Human immunodeficiency virus [HIV] disease: Secondary | ICD-10-CM | POA: Diagnosis present

## 2014-03-23 DIAGNOSIS — I429 Cardiomyopathy, unspecified: Secondary | ICD-10-CM | POA: Diagnosis present

## 2014-03-23 DIAGNOSIS — E669 Obesity, unspecified: Secondary | ICD-10-CM | POA: Diagnosis present

## 2014-03-23 DIAGNOSIS — R05 Cough: Secondary | ICD-10-CM

## 2014-03-23 DIAGNOSIS — J189 Pneumonia, unspecified organism: Principal | ICD-10-CM | POA: Diagnosis present

## 2014-03-23 DIAGNOSIS — D472 Monoclonal gammopathy: Secondary | ICD-10-CM | POA: Diagnosis present

## 2014-03-23 DIAGNOSIS — J9621 Acute and chronic respiratory failure with hypoxia: Secondary | ICD-10-CM | POA: Diagnosis present

## 2014-03-23 DIAGNOSIS — I739 Peripheral vascular disease, unspecified: Secondary | ICD-10-CM | POA: Diagnosis present

## 2014-03-23 DIAGNOSIS — R059 Cough, unspecified: Secondary | ICD-10-CM

## 2014-03-23 LAB — CBC WITH DIFFERENTIAL/PLATELET
Basophils Absolute: 0 10*3/uL (ref 0.0–0.1)
Basophils Relative: 0 % (ref 0–1)
Eosinophils Absolute: 0 10*3/uL (ref 0.0–0.7)
Eosinophils Relative: 0 % (ref 0–5)
HCT: 34.3 % — ABNORMAL LOW (ref 36.0–46.0)
Hemoglobin: 10.7 g/dL — ABNORMAL LOW (ref 12.0–15.0)
Lymphocytes Relative: 13 % (ref 12–46)
Lymphs Abs: 1.3 10*3/uL (ref 0.7–4.0)
MCH: 36.9 pg — ABNORMAL HIGH (ref 26.0–34.0)
MCHC: 31.2 g/dL (ref 30.0–36.0)
MCV: 118.3 fL — ABNORMAL HIGH (ref 78.0–100.0)
Monocytes Absolute: 0.6 10*3/uL (ref 0.1–1.0)
Monocytes Relative: 6 % (ref 3–12)
Neutro Abs: 8.4 10*3/uL — ABNORMAL HIGH (ref 1.7–7.7)
Neutrophils Relative %: 81 % — ABNORMAL HIGH (ref 43–77)
Platelets: 206 10*3/uL (ref 150–400)
RBC: 2.9 MIL/uL — ABNORMAL LOW (ref 3.87–5.11)
RDW: 13.2 % (ref 11.5–15.5)
WBC: 10.3 10*3/uL (ref 4.0–10.5)

## 2014-03-23 LAB — BASIC METABOLIC PANEL
Anion gap: 10 (ref 5–15)
BUN: 13 mg/dL (ref 6–23)
CO2: 31 mmol/L (ref 19–32)
Calcium: 7.9 mg/dL — ABNORMAL LOW (ref 8.4–10.5)
Chloride: 99 mmol/L (ref 96–112)
Creatinine, Ser: 4.8 mg/dL — ABNORMAL HIGH (ref 0.50–1.10)
GFR calc Af Amer: 11 mL/min — ABNORMAL LOW (ref >90)
GFR calc non Af Amer: 9 mL/min — ABNORMAL LOW (ref >90)
Glucose, Bld: 101 mg/dL — ABNORMAL HIGH (ref 70–99)
Potassium: 4 mmol/L (ref 3.5–5.1)
Sodium: 140 mmol/L (ref 135–145)

## 2014-03-23 LAB — I-STAT TROPONIN, ED: Troponin i, poc: 0.03 ng/mL (ref 0.00–0.08)

## 2014-03-23 LAB — I-STAT CG4 LACTIC ACID, ED: Lactic Acid, Venous: 0.61 mmol/L (ref 0.5–2.0)

## 2014-03-23 MED ORDER — LEVOFLOXACIN IN D5W 500 MG/100ML IV SOLN: 500.0000 mg | INTRAVENOUS | Status: DC

## 2014-03-23 MED ORDER — LEVOFLOXACIN IN D5W 750 MG/150ML IV SOLN
750.0000 mg | INTRAVENOUS | Status: AC
  Administered 2014-03-23: 750 mg via INTRAVENOUS
  Filled 2014-03-23: qty 150

## 2014-03-23 NOTE — Progress Notes (Signed)
ANTIBIOTIC CONSULT NOTE - INITIAL  Pharmacy Consult for Levaquin Indication: pneumonia  Allergies  Allergen Reactions  . Penicillins Nausea And Vomiting and Rash    Patient Measurements:     Vital Signs: Temp: 100.8 F (38.2 C) (03/17 1610) Temp Source: Oral (03/17 1610) BP: 105/63 mmHg (03/17 1610) Pulse Rate: 90 (03/17 1610) Intake/Output from previous day:   Intake/Output from this shift:    Labs:  Recent Labs  03/23/14 1634  WBC 10.3  HGB 10.7*  PLT 206  CREATININE 4.80*   CrCl cannot be calculated (Unknown ideal weight.). No results for input(s): VANCOTROUGH, VANCOPEAK, VANCORANDOM, GENTTROUGH, GENTPEAK, GENTRANDOM, TOBRATROUGH, TOBRAPEAK, TOBRARND, AMIKACINPEAK, AMIKACINTROU, AMIKACIN in the last 72 hours.   Microbiology: No results found for this or any previous visit (from the past 720 hour(s)).  Medical History: Past Medical History  Diagnosis Date  . Human immunodeficiency virus (HIV) disease   . Hypertension   . Clostridium difficile infection 04/04/10    07/2011 and 08/2011  . Bacteriuria, asymptomatic 04/04/10    Culture grew VRE sensitive to linesolid   . MGUS (monoclonal gammopathy of unknown significance)   . History of bacteremia     MSSA; 05/2011, 08/2011  . Anemia   . Systolic heart failure     08/27/11 EF 35-40%  . Cancer   . ESRD (end stage renal disease) 03/19/2007    Started HD in 2010.  ESRD was due to HIV per pt. Gets HD on [address] [employer] [address] at [facility] on TTS schedule.     . Peripheral vascular disease   . Endocarditis     MSSA  . 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 Clostridium difficile colitis   . Septicemia due to enterococcus     Medications:  Scheduled:   Infusions:  . [START ON 03/25/2014] levofloxacin (LEVAQUIN) IV    . levofloxacin (LEVAQUIN) IV     PRN:   Assessment: 59 yo female with HIV, end-stage renal on dialysis (last session prior to arrival), heart failure who presents  from a rehabilitation facility with complaint of fever, cough, shortness of breath. CXR concerning for early pneumonia. Pharmacy is consulted to dose levaquin.  Goal of Therapy:  Eradication of infection Dose appropriate for renal function  Plan:   Levaquin 750mg  IV x 1, then 500mg  IV q48h Follow up renal function & cultures, clinical course  Peggyann Juba, PharmD, BCPS Pager: [phone] 03/23/2014,6:00 PM

## 2014-03-23 NOTE — ED Notes (Signed)
Still unable to give hand off report, transferred to several floor RN, then on hold for >5 mins, will call [facility] initiate pt transfer while I try to call the floor in 15 mins.

## 2014-03-23 NOTE — H&P (Signed)
[facility] History and Physical  TRESE JAEGERS [id number] DOB: April 28, 1955 DOA: 03/23/2014  Referring physician: Dr. Maryan Rued PCP: Osa Craver, MD   Chief Complaint: sob, cough, fever  HPI: [name] is a 59 y.o. female  With history of end-stage renal disease on hemodialysis Tuesday Thursdays and Saturdays, history of HIV disease, and history of anxiety. Results to the hospital complaining of cough and fever and some mild shortness of breath. Upon further examination patient was found to have findings suspicious for infiltrate on chest x-ray. The patient does endorse fevers. She reports some productive cough of clear sputum. The problem has been persistent since onset and has gradually gotten worse as a result patient presented to the hospital for further evaluation recommendations.  We were consulted for further medical evaluation and recommendations for patients presumed pneumonia. Of note patient was dialyzed the day of admission   Review of Systems:  Constitutional:  No weight loss, night sweats, + Fevers, chills, fatigue.  HEENT:  No headaches, Difficulty swallowing,Tooth/dental problems,Sore throat,  No sneezing, itching, ear ache, nasal congestion, post nasal drip,  Cardio-vascular:  No chest pain, Orthopnea, PND, swelling in lower extremities, anasarca, dizziness, palpitations  GI:  No heartburn, indigestion, abdominal pain, nausea, vomiting, diarrhea, change in bowel habits, loss of appetite  Resp:  + shortness of breath with exertion or at rest. No excess mucus, no productive cough, No non-productive cough, No coughing up of blood.No change in color of mucus.No wheezing.No chest wall deformity  Skin:  no rash or lesions.  GU:  no dysuria, change in color of urine, no urgency or frequency. No flank pain.  Musculoskeletal:  No joint pain or swelling. No decreased range of motion. No back pain.  Psych:  No change in mood or affect. No depression or  anxiety. No memory loss.   Past Medical History  Diagnosis Date  . Human immunodeficiency virus (HIV) disease   . Hypertension   . Clostridium difficile infection 04/04/10    07/2011 and 08/2011  . Bacteriuria, asymptomatic 04/04/10    Culture grew VRE sensitive to linesolid   . MGUS (monoclonal gammopathy of unknown significance)   . History of bacteremia     MSSA; 05/2011, 08/2011  . Anemia   . Systolic heart failure     08/27/11 EF 35-40%  . Cancer   . ESRD (end stage renal disease) 03/19/2007    Started HD in 2010.  ESRD was due to HIV per pt. Gets HD on [address] [employer] [address] at [facility] on TTS schedule.     . Peripheral vascular disease   . Endocarditis     MSSA  . 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 Clostridium difficile colitis   . Septicemia due to enterococcus    Past Surgical History  Procedure Laterality Date  . Av fistula placement    . Insertion of dialysis catheter  12/30/2010    Procedure: INSERTION OF DIALYSIS CATHETER;  Surgeon: Elam Dutch, MD;  Location: [facility];  Service: Vascular;  Laterality: Left;  Insertion of left internal jugular dialysis catheter  . Vascular surgery    . Insertion of dialysis catheter  05/26/2011    Procedure: INSERTION OF DIALYSIS CATHETER;  Surgeon: Conrad , MD;  Location: [facility];  Service: Vascular;  Laterality: N/A;  Insertion tunneled dialysis catheter in Right Internal Jugular with 23 cm catheter   . Insertion of dialysis catheter  08/26/2011  Procedure: INSERTION OF DIALYSIS CATHETER;  Surgeon: Elam Dutch, MD;  Location: [facility] OR;  Service: Vascular;  Laterality: Right;  insertion of Right Femoral vein Dialysis catheter  . Tee without cardioversion  08/27/2011    Procedure: TRANSESOPHAGEAL ECHOCARDIOGRAM (TEE);  Surgeon: Pixie Casino, MD;  Location: [facility];  Service: Cardiovascular;  Laterality: N/A;  . Av fistula placement  09/01/2011    Procedure: INSERTION OF ARTERIOVENOUS (AV)  GORE-TEX GRAFT ARM;  Surgeon: Rosetta Posner, MD;  Location[facility] OR;  Service: Vascular;  Laterality: Right;  Using 10mm x 50  stretch goretex graft  . Thrombectomy and revision of arterioventous (av) goretex  graft Right 02/13/2012    Procedure: THROMBECTOMY AND REVISION OF ARTERIOVENTOUS (AV) GORETEX  GRAFT;  Surgeon: Angelia Mould, MD;  Location: [place];  Service: Vascular;  Laterality: Right;  . Tee without cardioversion N/A 02/16/2012    Procedure: TRANSESOPHAGEAL ECHOCARDIOGRAM (TEE);  Surgeon: Fay Records, MD;  Location: [place];  Service: Cardiovascular;  Laterality: N/A;  Rm 6710  . Avgg removal Right 03/29/2012    Procedure: REMOVAL OF ARTERIOVENOUS GORETEX GRAFT ([place]);  Surgeon: Rosetta Posner, MD;  Location: [facility];  Service: Vascular;  Laterality: Right;  . Insertion of dialysis catheter N/A 03/29/2012    Procedure: INSERTION OF DIALYSIS CATHETER;  Surgeon: Rosetta Posner, MD;  Location: [place];  Service: Vascular;  Laterality: N/A;  Ultrasound guided  . Av fistula placement Left 05/03/2012    Procedure: INSERTION OF ARTERIOVENOUS (AV) GORE-TEX GRAFT THIGH USING 19mm X 70cm Gore-Tex Graft ;  Surgeon: Rosetta Posner, MD;  Location: [facility] OR;  Service: Vascular;  Laterality: Left;  . Transthoracic echocardiogram  08/27/2011    EF 35-40%, mild-moderate concetric hypertrophy of the LV, moderately reduced LV systolic function,  . Abdominal aortagram N/A 12/23/2010    Procedure: ABDOMINAL Maxcine [place];  Surgeon: Angelia Mould, MD;  Location: [facility] CATH LAB;  Service: Cardiovascular;  Laterality: N/A;  . Lower extremity angiogram  12/23/2010    Procedure: LOWER EXTREMITY ANGIOGRAM;  Surgeon: Angelia Mould, MD;  Location: [facility] CATH LAB;  Service: Cardiovascular;;   Social History:  reports that she has never smoked. She has never used smokeless tobacco. She reports that she uses illicit drugs (Cocaine). She reports that she does not drink alcohol.  Allergies  Allergen Reactions  .  Penicillins Nausea And Vomiting and Rash    Family History  Problem Relation Age of Onset  . Alcohol abuse      family h/o addiction/alcoholism  . Diabetes Cousin     first degree relatives  . Kidney disease Mother   . Kidney disease Maternal Uncle   . Cancer Sister   . Heart disease Father     Prior to Admission medications   Medication Sig Start Date End Date Taking? Authorizing Provider  abacavir (ZIAGEN) 300 MG tablet Take 600 mg by mouth daily at 12 noon.    Yes Historical Provider, MD  acetaminophen (TYLENOL) 325 MG tablet Take 650 mg by mouth every 4 (four) hours as needed for mild pain.   Yes Historical Provider, MD  aspirin 81 MG chewable tablet Chew 81 mg by mouth daily at 12 noon.    Yes Historical Provider, MD  b complex vitamins tablet Take 1 tablet by mouth daily at 12 noon.    Yes Historical Provider, MD  cinacalcet (SENSIPAR) 30 MG tablet Take 30 mg by mouth every morning.   Yes Historical Provider, MD  Darunavir  Ethanolate (PREZISTA) 800 MG tablet Take 1 tablet (800 mg total) by mouth daily with breakfast. 09/02/11  Yes Cresenciano Genre, MD  HYDROcodone-acetaminophen (NORCO/VICODIN) 5-325 MG per tablet Take one tablet by mouth every 4 hours as needed for pain 08/01/13  Yes Tiffany L Reed, DO  lamiVUDine (EPIVIR) 10 MG/ML solution Take 50 mg by mouth daily at 12 noon.   Yes Historical Provider, MD  midodrine (PROAMATINE) 10 MG tablet Take 10 mg by mouth 2 (two) times daily. At 630am and 10pm   Yes Historical Provider, MD  Multiple Vitamins-Minerals (CERTAGEN PO) Take 1 tablet by mouth daily at 12 noon.   Yes Historical Provider, MD  raltegravir (ISENTRESS) 400 MG tablet Take 1 tablet (400 mg total) by mouth 2 (two) times daily. 04/15/13  Yes Truman Hayward, MD  ritonavir (NORVIR) 100 MG TABS Take 1 tablet (100 mg total) by mouth daily with breakfast. 09/02/11  Yes Cresenciano Genre, MD  sevelamer carbonate (RENVELA) 800 MG tablet Take 1,600 mg by mouth 3 (three) times daily  with meals.   Yes Historical Provider, MD  tenofovir (VIREAD) 300 MG tablet Take 300 mg by mouth once a week. On Fridays   Yes Historical Provider, MD  calcium acetate (PHOSLO) 667 MG capsule Take 2 capsules (1,334 mg total) by mouth 3 (three) times daily with meals. Patient not taking: Reported on 03/23/2014 02/17/12   Wilber Oliphant, MD  clonazePAM Bobbye [place]) 0.5 MG tablet Take one tablet by mouth as needed post dialysis treatments for cramping Patient not taking: Reported on 03/23/2014 05/18/13   Lauree Chandler, NP   Physical Exam: Filed Vitals:   03/23/14 1610 03/23/14 1611 03/23/14 1617 03/23/14 1811  BP: 105/63   108/62  Pulse: 90   78  Temp: 100.8 F (38.2 C)   98.9 F (37.2 C)  TempSrc: Oral   Oral  Resp: 22   18  SpO2: 88% 94% 94% 99%    Wt Readings from Last 3 Encounters:  12/07/13 89.812 kg (198 lb)  10/29/13 94.802 kg (209 lb)  10/12/13 88.905 kg (196 lb)    General:  Appears calm and comfortable Eyes: PERRL, normal lids, irises & conjunctiva ENT: grossly normal hearing, lips & tongue Neck: no LAD, masses or thyromegaly Cardiovascular: RRR, no m/r/g. .  Respiratory: rhales L> R, no wheezes, equal chest rise Abdomen: soft, nt, nd Skin: no rash or induration seen on limited exam Musculoskeletal: grossly normal tone BUE/BLE Psychiatric: grossly normal mood and affect, speech fluent and appropriate Neurologic: Non focal on exam          Labs on Admission:  Basic Metabolic Panel:  Recent Labs Lab 03/23/14 1634  NA 140  K 4.0  CL 99  CO2 31  GLUCOSE 101*  BUN 13  CREATININE 4.80*  CALCIUM 7.9*   Liver Function Tests: No results for input(s): AST, ALT, ALKPHOS, BILITOT, PROT, ALBUMIN in the last 168 hours. No results for input(s): LIPASE, AMYLASE in the last 168 hours. No results for input(s): AMMONIA in the last 168 hours. CBC:  Recent Labs Lab 03/23/14 1634  WBC 10.3  NEUTROABS 8.4*  HGB 10.7*  HCT 34.3*  MCV 118.3*  PLT 206   Cardiac  Enzymes: No results for input(s): CKTOTAL, CKMB, CKMBINDEX, TROPONINI in the last 168 hours.  BNP (last 3 results) No results for input(s): BNP in the last 8760 hours.  ProBNP (last 3 results)  Recent Labs  04/19/13 2236  PROBNP 39695.0*  CBG: No results for input(s): GLUCAP in the last 168 hours.  Radiological Exams on Admission: Dg Chest 2 View  03/23/2014   CLINICAL DATA:  Cough since Friday, fever, shortness of breath, history hypertension, HIV, former smoker, end-stage renal disease  EXAM: CHEST  2 VIEW  COMPARISON:  04/21/2013  FINDINGS: Enlargement of cardiac silhouette.  Elongation of thoracic aorta.  Mediastinal contours and pulmonary vascularity normal.  Mild bibasilar atelectasis versus infiltrate.  Bronchitic changes.  Remaining lungs clear.  No pleural effusion or pneumothorax.  IMPRESSION: Bronchitic changes with bibasilar atelectasis versus infiltrate increased since previous exam.   Electronically Signed   By: Lavonia Dana M.D.   On: 03/23/2014 17:05    Assessment/Plan   PNA (pneumonia) - Place routine pneumonia order set. -Await sputum culture -Obtain viral load and CD4 counts - Will cover with Levaquin at this juncture. Will broaden antibiotic spectrum pending hospital course  Active Problems:   ESRD (end stage renal disease) on dialysis -Patient dialyzed today 03/23/2014. Dialyzes Tuesday Thursdays and Saturdays. As such should patient still be here on Saturday will require nephrology consultation.  HIV - Continue home anti-retroviral medications. - As mentioned above will obtain viral load and CD4 counts.  Code Status: full DVT Prophylaxis: Heparin Family Communication: None at bedside Disposition Plan: [place] hospital  Time spent: > 45 minutes  Velvet Bathe [facility] Pager [phone]

## 2014-03-23 NOTE — ED Notes (Signed)
Pt presents from [facility] via EMS, initial c/o of fever (101.17F) at the facility, temp on triage is 100.69F, pt got 650 mg of Tylenol at 1300 hrs, pt also c/o of a throbbing headache, O2 sats on RA remain <88%, denies shortness of breath, chest pain on rest but remarks on a dry cough. Pt put on 2L nasal canula to bring the O2 sats >94%.

## 2014-03-23 NOTE — ED Notes (Signed)
Floor unable to take report at this time, states it is shift change.

## 2014-03-23 NOTE — Progress Notes (Signed)
Two attempts made to receive report on patient.  Called [phone] as instructed but placed on hold.

## 2014-03-23 NOTE — ED Notes (Signed)
Bed: [facility] Expected date:  Expected time:  Means of arrival:  Comments: Ems, clavicle deform

## 2014-03-23 NOTE — ED Provider Notes (Signed)
CSN: [id number]     Arrival date & time 03/23/14  1610 History   First MD Initiated Contact with Patient 03/23/14 1611     No chief complaint on file.    (Consider location/radiation/quality/duration/timing/severity/associated sxs/prior Treatment) Patient is a 59 y.o. female presenting with URI. The history is provided by the patient.  URI Presenting symptoms: congestion, cough, fatigue and fever   Presenting symptoms: no sore throat   Severity:  Moderate Onset quality:  Gradual Duration:  4 days Timing:  Constant Progression:  Worsening Chronicity:  New Relieved by:  Nothing Worsened by:  Movement Ineffective treatments:  None tried Associated symptoms: headaches and sinus pain   Associated symptoms: no neck pain and no wheezing   Risk factors: chronic cardiac disease, chronic kidney disease and sick contacts   Risk factors: no chronic respiratory disease and no diabetes mellitus     Past Medical History  Diagnosis Date  . Human immunodeficiency virus (HIV) disease   . Hypertension   . Clostridium difficile infection 04/04/10    07/2011 and 08/2011  . Bacteriuria, asymptomatic 04/04/10    Culture grew VRE sensitive to linesolid   . MGUS (monoclonal gammopathy of unknown significance)   . History of bacteremia     MSSA; 05/2011, 08/2011  . Anemia   . Systolic heart failure     08/27/11 EF 35-40%  . Cancer   . ESRD (end stage renal disease) 03/19/2007    Started HD in 2010.  ESRD was due to HIV per pt. Gets HD on [employer] at [facility] on TTS schedule.     . Peripheral vascular disease   . Endocarditis     MSSA  . 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 Clostridium difficile colitis   . Septicemia due to enterococcus    Past Surgical History  Procedure Laterality Date  . Av fistula placement    . Insertion of dialysis catheter  12/30/2010    Procedure: INSERTION OF DIALYSIS CATHETER;  Surgeon: Elam Dutch, MD;  Location: [place];  Service: Vascular;  Laterality: Left;  Insertion of left internal jugular dialysis catheter  . Vascular surgery    . Insertion of dialysis catheter  05/26/2011    Procedure: INSERTION OF DIALYSIS CATHETER;  Surgeon: Conrad Elrama, MD;  Location: [place];  Service: Vascular;  Laterality: N/A;  Insertion tunneled dialysis catheter in Right Internal Jugular with 23 cm catheter   . Insertion of dialysis catheter  08/26/2011    Procedure: INSERTION OF DIALYSIS CATHETER;  Surgeon: Elam Dutch, MD;  Location: [facility] OR;  Service: Vascular;  Laterality: Right;  insertion of Right Femoral vein Dialysis catheter  . Tee without cardioversion  08/27/2011    Procedure: TRANSESOPHAGEAL ECHOCARDIOGRAM (TEE);  Surgeon: Pixie Casino, MD;  Location: [place];  Service: Cardiovascular;  Laterality: N/A;  . Av fistula placement  09/01/2011    Procedure: INSERTION OF ARTERIOVENOUS (AV) GORE-TEX GRAFT ARM;  Surgeon: Rosetta Posner, MD;  Location: [facility] OR;  Service: Vascular;  Laterality: Right;  Using 63mm x 50  stretch goretex graft  . Thrombectomy and revision of arterioventous (av) goretex  graft Right 02/13/2012    Procedure: THROMBECTOMY AND REVISION OF ARTERIOVENTOUS (AV) GORETEX  GRAFT;  Surgeon: Angelia Mould, MD;  Location: [place];  Service: Vascular;  Laterality: Right;  . Tee without cardioversion N/A 02/16/2012    Procedure: TRANSESOPHAGEAL ECHOCARDIOGRAM (TEE);  Surgeon: Carmin Muskrat  Harrington Challenger, MD;  Location: [place];  Service: Cardiovascular;  Laterality: N/A;  Rm [id number]  . Avgg removal Right 03/29/2012    Procedure: REMOVAL OF ARTERIOVENOUS GORETEX GRAFT (Adin);  Surgeon: Rosetta Posner, MD;  Location: [place];  Service: Vascular;  Laterality: Right;  . Insertion of dialysis catheter N/A 03/29/2012    Procedure: INSERTION OF DIALYSIS CATHETER;  Surgeon: Rosetta Posner, MD;  Location: [place];  Service: Vascular;  Laterality: N/A;  Ultrasound guided  . Av fistula placement Left 05/03/2012    Procedure: INSERTION OF  ARTERIOVENOUS (AV) GORE-TEX GRAFT THIGH USING 29mm X 70cm Gore-Tex Graft ;  Surgeon: Rosetta Posner, MD;  Location: [facility] OR;  Service: Vascular;  Laterality: Left;  . Transthoracic echocardiogram  08/27/2011    EF 35-40%, mild-moderate concetric hypertrophy of the LV, moderately reduced LV systolic function,  . Abdominal aortagram N/A 12/23/2010    Procedure: ABDOMINAL Maxcine Ham;  Surgeon: Angelia Mould, MD;  Location: [facility] CATH LAB;  Service: Cardiovascular;  Laterality: N/A;  . Lower extremity angiogram  12/23/2010    Procedure: LOWER EXTREMITY ANGIOGRAM;  Surgeon: Angelia Mould, MD;  Location: [facility] CATH LAB;  Service: Cardiovascular;;   Family History  Problem Relation Age of Onset  . Alcohol abuse      family h/o addiction/alcoholism  . Diabetes Cousin     first degree relatives  . Kidney disease Mother   . Kidney disease Maternal Uncle   . Cancer Sister   . Heart disease Father    History  Substance Use Topics  . Smoking status: Never Smoker   . Smokeless tobacco: Never Used  . Alcohol Use: No   OB History    No data available     Review of Systems  Constitutional: Positive for fever and fatigue.  HENT: Positive for congestion. Negative for sore throat.   Respiratory: Positive for cough. Negative for wheezing.   Musculoskeletal: Negative for neck pain.  Neurological: Positive for headaches.  All other systems reviewed and are negative.     Allergies  Penicillins  Home Medications   Prior to Admission medications   Medication Sig Start Date End Date Taking? Authorizing Provider  abacavir (ZIAGEN) 300 MG tablet Take 600 mg by mouth daily at 12 noon.     Historical Provider, MD  aspirin 81 MG chewable tablet Chew 81 mg by mouth daily at 12 noon.     Historical Provider, MD  b complex vitamins tablet Take 1 tablet by mouth daily at 12 noon.     Historical Provider, MD  calcium acetate (PHOSLO) 667 MG capsule Take 2 capsules (1,334 mg total) by mouth 3 (three)  times daily with meals. 02/17/12   Wilber Oliphant, MD  clonazePAM (KLONOPIN) 0.5 MG tablet Take one tablet by mouth as needed post dialysis treatments for cramping 05/18/13   Lauree Chandler, NP  Darunavir Ethanolate (PREZISTA) 800 MG tablet Take 1 tablet (800 mg total) by mouth daily with breakfast. 09/02/11   Cresenciano Genre, MD  ethyl chloride spray Apply 1 application topically 3 (three) times a week. To graft for dialysis Tuesday, Thursday and Saturday    Historical Provider, MD  guaifenesin (ROBITUSSIN) 100 MG/5ML syrup Take 200 mg by mouth every 6 (six) hours as needed for cough or congestion.    Historical Provider, MD  HYDROcodone-acetaminophen (NORCO/VICODIN) 5-325 MG per tablet Take one tablet by mouth every 4 hours as needed for pain 08/01/13   Tiffany L Reed, DO  lamiVUDine (  EPIVIR) 10 MG/ML solution Take 50 mg by mouth daily at 12 noon.    Historical Provider, MD  midodrine (PROAMATINE) 10 MG tablet Take 10 mg by mouth 2 (two) times daily. At 630am and 10pm    Historical Provider, MD  Multiple Vitamins-Minerals (CERTAGEN PO) Take 1 tablet by mouth daily at 12 noon.    Historical Provider, MD  raltegravir (ISENTRESS) 400 MG tablet Take 1 tablet (400 mg total) by mouth 2 (two) times daily. 04/15/13   Truman Hayward, MD  ritonavir (NORVIR) 100 MG TABS Take 1 tablet (100 mg total) by mouth daily with breakfast. 09/02/11   Cresenciano Genre, MD  tenofovir (VIREAD) 300 MG tablet Take 300 mg by mouth once a week. On Fridays    Historical Provider, MD   BP 105/63 mmHg  Pulse 90  Temp(Src) 100.8 F (38.2 C) (Oral)  Resp 22  SpO2 94% Physical Exam  Constitutional: She is oriented to person, place, and time. She appears well-developed and well-nourished. No distress.  HENT:  Head: Normocephalic and atraumatic.  Mouth/Throat: Oropharynx is clear and moist. No oropharyngeal exudate.  Eyes: Conjunctivae and EOM are normal. Pupils are equal, round, and reactive to light.  Neck: Normal range of  motion. Neck supple.  Cardiovascular: Normal rate, regular rhythm and intact distal pulses.   No murmur heard. Pulmonary/Chest: Effort normal. No respiratory distress. She has wheezes in the right middle field, the right lower field and the left middle field. She has rhonchi in the left lower field. She has no rales.  Abdominal: Soft. She exhibits no distension. There is no tenderness. There is no rebound and no guarding.  Musculoskeletal: Normal range of motion. She exhibits edema. She exhibits no tenderness.  Fistula present in the left upper arm  Neurological: She is alert and oriented to person, place, and time.  Skin: Skin is warm and dry. No rash noted. No erythema.  Psychiatric: She has a normal mood and affect. Her behavior is normal.  Nursing note and vitals reviewed.   ED Course  Procedures (including critical care time) Labs Review Labs Reviewed  CBC WITH DIFFERENTIAL/PLATELET - Abnormal; Notable for the following:    RBC 2.90 (*)    Hemoglobin 10.7 (*)    HCT 34.3 (*)    MCV 118.3 (*)    MCH 36.9 (*)    All other components within normal limits  BASIC METABOLIC PANEL - Abnormal; Notable for the following:    Glucose, Bld 101 (*)    Creatinine, Ser 4.80 (*)    Calcium 7.9 (*)    GFR calc non Af Amer 9 (*)    GFR calc Af Amer 11 (*)    All other components within normal limits  I-STAT TROPOININ, ED  I-STAT CG4 LACTIC ACID, ED    Imaging Review Dg Chest 2 View  03/23/2014   CLINICAL DATA:  Cough since Friday, fever, shortness of breath, history hypertension, HIV, former smoker, end-stage renal disease  EXAM: CHEST  2 VIEW  COMPARISON:  04/21/2013  FINDINGS: Enlargement of cardiac silhouette.  Elongation of thoracic aorta.  Mediastinal contours and pulmonary vascularity normal.  Mild bibasilar atelectasis versus infiltrate.  Bronchitic changes.  Remaining lungs clear.  No pleural effusion or pneumothorax.  IMPRESSION: Bronchitic changes with bibasilar atelectasis versus  infiltrate increased since previous exam.   Electronically Signed   By: Lavonia Dana M.D.   On: 03/23/2014 17:05    ED ECG REPORT   Date: 03/23/2014  Rate:  78  Rhythm: normal sinus rhythm  QRS Axis: normal  Intervals: normal  ST/T Wave abnormalities: nonspecific T wave changes  Conduction Disutrbances:none  Narrative Interpretation:   Old EKG Reviewed: unchanged  I have personally reviewed the EKG tracing and agree with the computerized printout as noted.   MDM   Final diagnoses:  Cough   Patient with a history of HIV, end-stage renal on dialysis, heart failure who presents from a rehabilitation facility with complaint of fever, cough, shortness of breath. Patient was satting 88% on room air which improved with nasal cannula oxygen. She had a temperature of 101.8 and was given Tylenol. Repeat temperature here was 100.3. Patient has decreased breath sounds in the left lower lobe. Her last dialysis was prior to arrival. She denies any abdominal complaints, chest pain or complaints of new volume overload. Patient did have a flu shot this year however concern for pneumonia versus flu.  CBC, BMP, troponin, lactate pending  5:15 PM Patient with chest x-ray concerning for developing infiltrate, white blood cell count of 10,000 and otherwise labs unremarkable for a dialysis patient. Concern for both flu versus early pneumonia. The patient needs IV antibiotics and admission due to her hypoxia. Influenza pcr pending.  Blanchie Dessert, MD 03/23/14 [phone]

## 2014-03-24 DIAGNOSIS — I9589 Other hypotension: Secondary | ICD-10-CM | POA: Diagnosis present

## 2014-03-24 DIAGNOSIS — E669 Obesity, unspecified: Secondary | ICD-10-CM

## 2014-03-24 LAB — T-HELPER CELLS (CD4) COUNT (NOT AT ARMC)
CD4 % Helper T Cell: 19 % — ABNORMAL LOW (ref 33–55)
CD4 T Cell Abs: 260 /uL — ABNORMAL LOW (ref 400–2700)

## 2014-03-24 LAB — BASIC METABOLIC PANEL
Anion gap: 9 (ref 5–15)
BUN: 17 mg/dL (ref 6–23)
CO2: 35 mmol/L — ABNORMAL HIGH (ref 19–32)
Calcium: 8.5 mg/dL (ref 8.4–10.5)
Chloride: 96 mmol/L (ref 96–112)
Creatinine, Ser: 6.51 mg/dL — ABNORMAL HIGH (ref 0.50–1.10)
GFR calc Af Amer: 7 mL/min — ABNORMAL LOW (ref >90)
GFR calc non Af Amer: 6 mL/min — ABNORMAL LOW (ref >90)
Glucose, Bld: 81 mg/dL (ref 70–99)
Potassium: 4.3 mmol/L (ref 3.5–5.1)
Sodium: 140 mmol/L (ref 135–145)

## 2014-03-24 LAB — CBC
HCT: 31.8 % — ABNORMAL LOW (ref 36.0–46.0)
HCT: 31.4 % — ABNORMAL LOW (ref 36.0–46.0)
Hemoglobin: 10.2 g/dL — ABNORMAL LOW (ref 12.0–15.0)
Hemoglobin: 9.8 g/dL — ABNORMAL LOW (ref 12.0–15.0)
MCH: 37.6 pg — ABNORMAL HIGH (ref 26.0–34.0)
MCH: 36.7 pg — ABNORMAL HIGH (ref 26.0–34.0)
MCHC: 32.1 g/dL (ref 30.0–36.0)
MCHC: 31.2 g/dL (ref 30.0–36.0)
MCV: 117.3 fL — ABNORMAL HIGH (ref 78.0–100.0)
MCV: 117.6 fL — ABNORMAL HIGH (ref 78.0–100.0)
Platelets: 191 10*3/uL (ref 150–400)
Platelets: 200 10*3/uL (ref 150–400)
RBC: 2.71 MIL/uL — ABNORMAL LOW (ref 3.87–5.11)
RBC: 2.67 MIL/uL — ABNORMAL LOW (ref 3.87–5.11)
RDW: 13 % (ref 11.5–15.5)
RDW: 13.1 % (ref 11.5–15.5)
WBC: 8.7 10*3/uL (ref 4.0–10.5)
WBC: 8.1 10*3/uL (ref 4.0–10.5)

## 2014-03-24 LAB — INFLUENZA PANEL BY PCR (TYPE A & B)
H1N1 flu by pcr: NOT DETECTED
H1N1 flu by pcr: NOT DETECTED
Influenza A By PCR: NEGATIVE
Influenza A By PCR: NEGATIVE
Influenza B By PCR: NEGATIVE
Influenza B By PCR: NEGATIVE

## 2014-03-24 LAB — CREATININE, SERUM
Creatinine, Ser: 5.93 mg/dL — ABNORMAL HIGH (ref 0.50–1.10)
GFR calc Af Amer: 8 mL/min — ABNORMAL LOW (ref >90)
GFR calc non Af Amer: 7 mL/min — ABNORMAL LOW (ref >90)

## 2014-03-24 LAB — MAGNESIUM: Magnesium: 2 mg/dL (ref 1.5–2.5)

## 2014-03-24 LAB — PHOSPHORUS: Phosphorus: 4.2 mg/dL (ref 2.3–4.6)

## 2014-03-24 MED ORDER — ACETAMINOPHEN 325 MG PO TABS: 650.0000 mg | ORAL_TABLET | ORAL | Status: DC | PRN

## 2014-03-24 MED ORDER — SEVELAMER CARBONATE 800 MG PO TABS
1600.0000 mg | ORAL_TABLET | Freq: Three times a day (TID) | ORAL | Status: DC
  Administered 2014-03-24 – 2014-03-27 (×9): 1600 mg via ORAL
  Filled 2014-03-24 (×14): qty 2

## 2014-03-24 MED ORDER — ASPIRIN 81 MG PO CHEW
81.0000 mg | CHEWABLE_TABLET | Freq: Every day | ORAL | Status: DC
  Administered 2014-03-24 – 2014-03-26 (×3): 81 mg via ORAL
  Filled 2014-03-24 (×4): qty 1

## 2014-03-24 MED ORDER — CINACALCET HCL 30 MG PO TABS
30.0000 mg | ORAL_TABLET | Freq: Every day | ORAL | Status: DC
  Administered 2014-03-24 – 2014-03-27 (×4): 30 mg via ORAL
  Filled 2014-03-24 (×6): qty 1

## 2014-03-24 MED ORDER — SODIUM CHLORIDE 0.9 % IJ SOLN: 3.0000 mL | INTRAMUSCULAR | Status: DC | PRN

## 2014-03-24 MED ORDER — SODIUM CHLORIDE 0.9 % IJ SOLN
3.0000 mL | Freq: Two times a day (BID) | INTRAMUSCULAR | Status: DC
  Administered 2014-03-23 – 2014-03-25 (×4): 3 mL via INTRAVENOUS

## 2014-03-24 MED ORDER — HYDROCODONE-ACETAMINOPHEN 5-325 MG PO TABS: 1.0000 | ORAL_TABLET | Freq: Four times a day (QID) | ORAL | Status: DC | PRN

## 2014-03-24 MED ORDER — RALTEGRAVIR POTASSIUM 400 MG PO TABS
400.0000 mg | ORAL_TABLET | Freq: Two times a day (BID) | ORAL | Status: DC
  Administered 2014-03-24 – 2014-03-27 (×7): 400 mg via ORAL
  Filled 2014-03-24 (×10): qty 1

## 2014-03-24 MED ORDER — HEPARIN SODIUM (PORCINE) 5000 UNIT/ML IJ SOLN
5000.0000 [IU] | Freq: Three times a day (TID) | INTRAMUSCULAR | Status: DC
  Administered 2014-03-24 – 2014-03-27 (×9): 5000 [IU] via SUBCUTANEOUS
  Filled 2014-03-24 (×12): qty 1

## 2014-03-24 MED ORDER — RITONAVIR 100 MG PO TABS
100.0000 mg | ORAL_TABLET | Freq: Every day | ORAL | Status: DC
  Administered 2014-03-24 – 2014-03-27 (×4): 100 mg via ORAL
  Filled 2014-03-24 (×6): qty 1

## 2014-03-24 MED ORDER — TENOFOVIR DISOPROXIL FUMARATE 300 MG PO TABS
300.0000 mg | ORAL_TABLET | ORAL | Status: DC
  Administered 2014-03-24: 300 mg via ORAL
  Filled 2014-03-24: qty 1

## 2014-03-24 MED ORDER — RENA-VITE PO TABS
1.0000 | ORAL_TABLET | Freq: Every day | ORAL | Status: DC
  Administered 2014-03-24 – 2014-03-26 (×3): 1 via ORAL
  Filled 2014-03-24 (×5): qty 1

## 2014-03-24 MED ORDER — DARUNAVIR ETHANOLATE 800 MG PO TABS
800.0000 mg | ORAL_TABLET | Freq: Every day | ORAL | Status: DC
  Administered 2014-03-24 – 2014-03-27 (×4): 800 mg via ORAL
  Filled 2014-03-24 (×7): qty 1

## 2014-03-24 MED ORDER — SODIUM CHLORIDE 0.9 % IV SOLN: 250.0000 mL | INTRAVENOUS | Status: DC | PRN

## 2014-03-24 MED ORDER — DARBEPOETIN ALFA 25 MCG/0.42ML IJ SOSY: 25.0000 ug | PREFILLED_SYRINGE | INTRAMUSCULAR | Status: DC

## 2014-03-24 MED ORDER — SODIUM CHLORIDE 0.9 % IV BOLUS (SEPSIS)
500.0000 mL | Freq: Once | INTRAVENOUS | Status: AC
  Administered 2014-03-24: 500 mL via INTRAVENOUS

## 2014-03-24 MED ORDER — ALBUTEROL SULFATE (2.5 MG/3ML) 0.083% IN NEBU
2.5000 mg | INHALATION_SOLUTION | Freq: Four times a day (QID) | RESPIRATORY_TRACT | Status: DC
  Administered 2014-03-24 – 2014-03-27 (×9): 2.5 mg via RESPIRATORY_TRACT
  Filled 2014-03-24 (×11): qty 3

## 2014-03-24 MED ORDER — LAMIVUDINE 10 MG/ML PO SOLN
50.0000 mg | Freq: Every day | ORAL | Status: DC
  Administered 2014-03-24 – 2014-03-26 (×3): 50 mg via ORAL
  Filled 2014-03-24 (×4): qty 5

## 2014-03-24 MED ORDER — CALCITRIOL 0.5 MCG PO CAPS
1.2500 ug | ORAL_CAPSULE | ORAL | Status: DC
  Administered 2014-03-25: 1.25 ug via ORAL
  Filled 2014-03-24 (×2): qty 1

## 2014-03-24 MED ORDER — MIDODRINE HCL 5 MG PO TABS
10.0000 mg | ORAL_TABLET | Freq: Two times a day (BID) | ORAL | Status: DC
  Administered 2014-03-24 – 2014-03-27 (×7): 10 mg via ORAL
  Filled 2014-03-24 (×10): qty 2

## 2014-03-24 MED ORDER — ABACAVIR SULFATE 300 MG PO TABS
600.0000 mg | ORAL_TABLET | Freq: Every day | ORAL | Status: DC
  Administered 2014-03-24 – 2014-03-26 (×3): 600 mg via ORAL
  Filled 2014-03-24 (×4): qty 2

## 2014-03-24 MED ORDER — LEVOFLOXACIN IN D5W 500 MG/100ML IV SOLN
500.0000 mg | INTRAVENOUS | Status: DC
  Administered 2014-03-25: 500 mg via INTRAVENOUS
  Filled 2014-03-24: qty 100

## 2014-03-24 NOTE — Progress Notes (Signed)
PROGRESS NOTE  [name] [id number] DOB: 21-Apr-1955 DOA: 03/23/2014 PCP: Osa Craver, MD  HPI/Recap of past 24 hours: Patient is a 59 year old female with past history of HIV, end-stage renal disease on hemodialysis and obesity who was admitted to the hospitalist service on 3/17 for shortness of breath and cough following her dialysis session. Chest x-ray confirmed pneumonia. Patient has chronically low blood pressure, but there were no other signs of sepsis. Lactic acid level normal as were other vitals Flu titer checked and found to be negative.  Patient doing okay. Complains of nonproductive cough. Tired, otherwise better than yesterday  Assessment/Plan: Principal Problem:  Active Problems:   Human immunodeficiency virus (HIV) disease   ESRD (end stage renal disease) on dialysis: Spoke with nephrology, dialysis tomorrow if patient's blood pressure can tolerate   Obesity (BMI 30-39.9): Patient is criteria with BMI greater than 30   Hypotension, chronic: No evidence of sepsis. Restarted midodrine  acute respiratory failure with hypoxia secondary to community-acquired pneumonia: Spoke with infectious disease. Given CD4 count 260, recommendation is for community acquired coverage with Levaquin, patient allergic to penicillins. Supplement oxygen as patient is hypoxic with out supplemental oxygen  Code Status: Full code  Family Communication: No family present  Disposition Plan: Continue in-hospital until oxygenation better, infection treated   Consultants:  Nephrology  Case discussed with infectious disease  Procedures:  Hemodialysis, possibly tomorrow  Antibiotics:  IV Levaquin 3/17-present   Objective: BP 82/48 mmHg  Pulse 70  Temp(Src) 100.2 F (37.9 C) (Oral)  Resp 16  Ht 5\' 4"  (1.626 m)  Wt 90.447 kg (199 lb 6.4 oz)  BMI 34.21 kg/m2  SpO2 98%  Intake/Output Summary (Last 24 hours) at 03/24/14 1730 Last data filed at 03/24/14 1538  Gross per 24  hour  Intake    662 ml  Output    280 ml  Net    382 ml   Filed Weights   03/23/14 2230  Weight: 90.447 kg (199 lb 6.4 oz)    Exam:   General:  Alert and oriented 3, no acute distress  Cardiovascular: Regular rate and rhythm, [id number], 3/6 systolic ejection murmur  Respiratory: Clear to auscultation bilaterally  Abdomen: Soft, nontender, nondistended, positive bowel sounds  Musculoskeletal: No clubbing or cyanosis, trace pitting edema   Data Reviewed: Basic Metabolic Panel:  Recent Labs Lab 03/23/14 1634 03/24/14 0109 03/24/14 0647  NA 140  --  140  K 4.0  --  4.3  CL 99  --  96  CO2 31  --  35*  GLUCOSE 101*  --  81  BUN 13  --  17  CREATININE 4.80* 5.93* 6.51*  CALCIUM 7.9*  --  8.5  MG  --  2.0  --   PHOS  --  4.2  --    Liver Function Tests: No results for input(s): AST, ALT, ALKPHOS, BILITOT, PROT, ALBUMIN in the last 168 hours. No results for input(s): LIPASE, AMYLASE in the last 168 hours. No results for input(s): AMMONIA in the last 168 hours. CBC:  Recent Labs Lab 03/23/14 1634 03/24/14 0109 03/24/14 0647  WBC 10.3 8.7 8.1  NEUTROABS 8.4*  --   --   HGB 10.7* 10.2* 9.8*  HCT 34.3* 31.8* 31.4*  MCV 118.3* 117.3* 117.6*  PLT 206 191 200   Cardiac Enzymes:   No results for input(s): CKTOTAL, CKMB, CKMBINDEX, TROPONINI in the last 168 hours. BNP (last 3 results) No results for input(s): BNP in the last 8760  hours.  ProBNP (last 3 results)  Recent Labs  04/19/13 2236  PROBNP 39695.0*    CBG: No results for input(s): GLUCAP in the last 168 hours.  No results found for this or any previous visit (from the past 240 hour(s)).   Studies: Dg Chest 2 View  03/23/2014   CLINICAL DATA:  Cough since Friday, fever, shortness of breath, history hypertension, HIV, former smoker, end-stage renal disease  EXAM: CHEST  2 VIEW  COMPARISON:  04/21/2013  FINDINGS: Enlargement of cardiac silhouette.  Elongation of thoracic aorta.  Mediastinal contours and  pulmonary vascularity normal.  Mild bibasilar atelectasis versus infiltrate.  Bronchitic changes.  Remaining lungs clear.  No pleural effusion or pneumothorax.  IMPRESSION: Bronchitic changes with bibasilar atelectasis versus infiltrate increased since previous exam.   Electronically Signed   By: Lavonia Dana M.D.   On: 03/23/2014 17:05    Scheduled Meds: . abacavir  600 mg Oral Q1200  . albuterol  2.5 mg Nebulization QID  . aspirin  81 mg Oral Q1200  . [START ON 03/25/2014] calcitRIOL  1.25 mcg Oral Q T,Th,Sa-HD  . cinacalcet  30 mg Oral Q breakfast  . [START ON 03/28/2014] darbepoetin (ARANESP) injection - DIALYSIS  25 mcg Intravenous Q Tue-HD  . Darunavir Ethanolate  800 mg Oral Q breakfast  . heparin  5,000 Units Subcutaneous 3 times per day  . lamiVUDine  50 mg Oral Q1200  . [START ON 03/25/2014] levofloxacin (LEVAQUIN) IV  500 mg Intravenous Q48H  . midodrine  10 mg Oral BID WC  . multivitamin  1 tablet Oral QHS  . raltegravir  400 mg Oral BID  . ritonavir  100 mg Oral Q breakfast  . sevelamer carbonate  1,600 mg Oral TID WC  . sodium chloride  3 mL Intravenous Q12H  . tenofovir  300 mg Oral Q Fri    Continuous Infusions:    Time spent: 35 minutes  [place] [facility] Pager [phone]. If 7PM-7AM, please contact night-coverage at [email or web address], password [facility] 03/24/2014, 5:30 PM  LOS: 1 day

## 2014-03-24 NOTE — Progress Notes (Signed)
Arrived to floor via strethcer with transport staff.  In stable condition at this time.  Assisted to side of bed.Alert and verbal with needs.  Made aware of the usage of call system.  Will continue to monitor.

## 2014-03-24 NOTE — Progress Notes (Signed)
SATURATION QUALIFICATIONS: (This note is used to comply with regulatory documentation for home oxygen)  Patient Saturations on Room Air at Rest = 85%  Patient Saturations on Room Air while Ambulating = did not check due to low O2 sat resting  Patient Saturations on 2 Liters of oxygen while Ambulating = 93%  Please briefly explain why patient needs home oxygen: low O2 sats on room air

## 2014-03-24 NOTE — Consult Note (Signed)
Indication for Consultation:  Management of ESRD/hemodialysis; anemia, hypertension/volume and secondary hyperparathyroidism  HPI: [name] is a 59 y.o. female who presented to the Ed last night from [facility] for evaluation of fever and cough. She recieves HD TTS @ [place], had a full treatment yesterday. History of HTN, HIV, cardiomyopathy. She reports a dry cough for days but otherwise has been feeling well. Last night she had a temp, in the ED chest xray showed infiltrate- antibiotics were started. She denies sob, She reports feeling much better this AM, Will arrange HD tomorrow and cont to follow while admitted.   Past Medical History  Diagnosis Date  . Human immunodeficiency virus (HIV) disease   . Hypertension   . Clostridium difficile infection 04/04/10    07/2011 and 08/2011  . Bacteriuria, asymptomatic 04/04/10    Culture grew VRE sensitive to linesolid   . MGUS (monoclonal gammopathy of unknown significance)   . History of bacteremia     MSSA; 05/2011, 08/2011  . Anemia   . Systolic heart failure     08/27/11 EF 35-40%  . Cancer   . ESRD (end stage renal disease) 03/19/2007    Started HD in 2010.  ESRD was due to HIV per pt. Gets HD on [employer] at [facility] on TTS schedule.     . Peripheral vascular disease   . Endocarditis     MSSA  . 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 Clostridium difficile colitis   . Septicemia due to enterococcus    Past Surgical History  Procedure Laterality Date  . Av fistula placement    . Insertion of dialysis catheter  12/30/2010    Procedure: INSERTION OF DIALYSIS CATHETER;  Surgeon: Elam Dutch, MD;  Location: [place];  Service: Vascular;  Laterality: Left;  Insertion of left internal jugular dialysis catheter  . Vascular surgery    . Insertion of dialysis catheter  05/26/2011    Procedure: INSERTION OF DIALYSIS CATHETER;  Surgeon: Conrad Evergreen, MD;  Location: [place];  Service: Vascular;   Laterality: N/A;  Insertion tunneled dialysis catheter in Right Internal Jugular with 23 cm catheter   . Insertion of dialysis catheter  08/26/2011    Procedure: INSERTION OF DIALYSIS CATHETER;  Surgeon: Elam Dutch, MD;  Location: [facility] OR;  Service: Vascular;  Laterality: Right;  insertion of Right Femoral vein Dialysis catheter  . Tee without cardioversion  08/27/2011    Procedure: TRANSESOPHAGEAL ECHOCARDIOGRAM (TEE);  Surgeon: Pixie Casino, MD;  Location: [place];  Service: Cardiovascular;  Laterality: N/A;  . Av fistula placement  09/01/2011    Procedure: INSERTION OF ARTERIOVENOUS (AV) GORE-TEX GRAFT ARM;  Surgeon: Rosetta Posner, MD;  Location: [facility] OR;  Service: Vascular;  Laterality: Right;  Using 20mm x 50  stretch goretex graft  . Thrombectomy and revision of arterioventous (av) goretex  graft Right 02/13/2012    Procedure: THROMBECTOMY AND REVISION OF ARTERIOVENTOUS (AV) GORETEX  GRAFT;  Surgeon: Angelia Mould, MD;  Location: [place];  Service: Vascular;  Laterality: Right;  . Tee without cardioversion N/A 02/16/2012    Procedure: TRANSESOPHAGEAL ECHOCARDIOGRAM (TEE);  Surgeon: Fay Records, MD;  Location: [place];  Service: Cardiovascular;  Laterality: N/A;  Rm [id number]  . Avgg removal Right 03/29/2012    Procedure: REMOVAL OF ARTERIOVENOUS GORETEX GRAFT ([place]);  Surgeon: Rosetta Posner, MD;  Location: [place];  Service: Vascular;  Laterality: Right;  .  Insertion of dialysis catheter N/A 03/29/2012    Procedure: INSERTION OF DIALYSIS CATHETER;  Surgeon: Rosetta Posner, MD;  Location: [place];  Service: Vascular;  Laterality: N/A;  Ultrasound guided  . Av fistula placement Left 05/03/2012    Procedure: INSERTION OF ARTERIOVENOUS (AV) GORE-TEX GRAFT THIGH USING 13mm X 70cm Gore-Tex Graft ;  Surgeon: Rosetta Posner, MD;  Location: [facility] OR;  Service: Vascular;  Laterality: Left;  . Transthoracic echocardiogram  08/27/2011    EF 35-40%, mild-moderate concetric hypertrophy of the LV, moderately  reduced LV systolic function,  . Abdominal aortagram N/A 12/23/2010    Procedure: ABDOMINAL Maxcine Ham;  Surgeon: Angelia Mould, MD;  Location: [facility] CATH LAB;  Service: Cardiovascular;  Laterality: N/A;  . Lower extremity angiogram  12/23/2010    Procedure: LOWER EXTREMITY ANGIOGRAM;  Surgeon: Angelia Mould, MD;  Location: [facility] CATH LAB;  Service: Cardiovascular;;   Family History  Problem Relation Age of Onset  . Alcohol abuse      family h/o addiction/alcoholism  . Diabetes Cousin     first degree relatives  . Kidney disease Mother   . Kidney disease Maternal Uncle   . Cancer Sister   . Heart disease Father    Social History:  reports that she has never smoked. She has never used smokeless tobacco. She reports that she uses illicit drugs (Cocaine). She reports that she does not drink alcohol. Allergies  Allergen Reactions  . Penicillins Nausea And Vomiting and Rash   Prior to Admission medications   Medication Sig Start Date End Date Taking? Authorizing Provider  abacavir (ZIAGEN) 300 MG tablet Take 600 mg by mouth daily at 12 noon.    Yes Historical Provider, MD  acetaminophen (TYLENOL) 325 MG tablet Take 650 mg by mouth every 4 (four) hours as needed for mild pain.   Yes Historical Provider, MD  aspirin 81 MG chewable tablet Chew 81 mg by mouth daily at 12 noon.    Yes Historical Provider, MD  b complex vitamins tablet Take 1 tablet by mouth daily at 12 noon.    Yes Historical Provider, MD  cinacalcet (SENSIPAR) 30 MG tablet Take 30 mg by mouth every morning.   Yes Historical Provider, MD  Darunavir Ethanolate (PREZISTA) 800 MG tablet Take 1 tablet (800 mg total) by mouth daily with breakfast. 09/02/11  Yes Cresenciano Genre, MD  HYDROcodone-acetaminophen (NORCO/VICODIN) 5-325 MG per tablet Take one tablet by mouth every 4 hours as needed for pain 08/01/13  Yes Tiffany L Reed, DO  lamiVUDine (EPIVIR) 10 MG/ML solution Take 50 mg by mouth daily at 12 noon.   Yes Historical  Provider, MD  midodrine (PROAMATINE) 10 MG tablet Take 10 mg by mouth 2 (two) times daily. At 630am and 10pm   Yes Historical Provider, MD  Multiple Vitamins-Minerals (CERTAGEN PO) Take 1 tablet by mouth daily at 12 noon.   Yes Historical Provider, MD  raltegravir (ISENTRESS) 400 MG tablet Take 1 tablet (400 mg total) by mouth 2 (two) times daily. 04/15/13  Yes Truman Hayward, MD  ritonavir (NORVIR) 100 MG TABS Take 1 tablet (100 mg total) by mouth daily with breakfast. 09/02/11  Yes Cresenciano Genre, MD  sevelamer carbonate (RENVELA) 800 MG tablet Take 1,600 mg by mouth 3 (three) times daily with meals.   Yes Historical Provider, MD  tenofovir (VIREAD) 300 MG tablet Take 300 mg by mouth once a week. On Fridays   Yes Historical Provider, MD  calcium acetate (  PHOSLO) 667 MG capsule Take 2 capsules (1,334 mg total) by mouth 3 (three) times daily with meals. Patient not taking: Reported on 03/23/2014 02/17/12   Wilber Oliphant, MD  clonazePAM Bobbye Charleston) 0.5 MG tablet Take one tablet by mouth as needed post dialysis treatments for cramping Patient not taking: Reported on 03/23/2014 05/18/13   Lauree Chandler, NP   Current Facility-Administered Medications  Medication Dose Route Frequency Provider Last Rate Last Dose  . 0.9 %  sodium chloride infusion  250 mL Intravenous PRN Annita Brod, MD      . abacavir (ZIAGEN) tablet 600 mg  600 mg Oral Q1200 Velvet Bathe, MD      . acetaminophen (TYLENOL) tablet 650 mg  650 mg Oral Q4H PRN Velvet Bathe, MD      . albuterol (PROVENTIL) (2.5 MG/3ML) 0.083% nebulizer solution 2.5 mg  2.5 mg Nebulization QID Annita Brod, MD   2.5 mg at 03/24/14 1045  . aspirin chewable tablet 81 mg  81 mg Oral Q1200 Velvet Bathe, MD      . cinacalcet (SENSIPAR) tablet 30 mg  30 mg Oral Q breakfast Velvet Bathe, MD   30 mg at 03/24/14 0942  . Darunavir Ethanolate (PREZISTA) tablet 800 mg  800 mg Oral Q breakfast Velvet Bathe, MD   800 mg at 03/24/14 0942  . heparin  injection 5,000 Units  5,000 Units Subcutaneous 3 times per day Velvet Bathe, MD      . HYDROcodone-acetaminophen (NORCO/VICODIN) 5-325 MG per tablet 1 tablet  1 tablet Oral Q6H PRN Velvet Bathe, MD      . lamiVUDine (EPIVIR) 10 MG/ML solution 50 mg  50 mg Oral Q1200 Velvet Bathe, MD      . Derrill Memo ON 03/25/2014] levofloxacin (LEVAQUIN) IVPB 500 mg  500 mg Intravenous Q48H Erenest Blank, RPH      . midodrine (PROAMATINE) tablet 10 mg  10 mg Oral BID WC Velvet Bathe, MD   10 mg at 03/24/14 0942  . raltegravir (ISENTRESS) tablet 400 mg  400 mg Oral BID Velvet Bathe, MD   400 mg at 03/24/14 0942  . ritonavir (NORVIR) tablet 100 mg  100 mg Oral Q breakfast Velvet Bathe, MD   100 mg at 03/24/14 0943  . sevelamer carbonate (RENVELA) tablet 1,600 mg  1,600 mg Oral TID WC Velvet Bathe, MD   1,600 mg at 03/24/14 0943  . sodium chloride 0.9 % injection 3 mL  3 mL Intravenous Q12H Velvet Bathe, MD   3 mL at 03/24/14 0942  . sodium chloride 0.9 % injection 3 mL  3 mL Intravenous PRN Velvet Bathe, MD      . tenofovir Veva Holes) tablet 300 mg  300 mg Oral Q Elenor Quinones, MD   300 mg at 03/24/14 [id number]   Labs: Basic Metabolic Panel:  Recent Labs Lab 03/23/14 1634 03/24/14 0109 03/24/14 0647  NA 140  --  140  K 4.0  --  4.3  CL 99  --  96  CO2 31  --  35*  GLUCOSE 101*  --  81  BUN 13  --  17  CREATININE 4.80* 5.93* 6.51*  CALCIUM 7.9*  --  8.5  PHOS  --  4.2  --    Liver Function Tests: No results for input(s): AST, ALT, ALKPHOS, BILITOT, PROT, ALBUMIN in the last 168 hours. No results for input(s): LIPASE, AMYLASE in the last 168 hours. No results for input(s): AMMONIA in the last 168 hours. CBC:  Recent Labs Lab 03/23/14 1634 03/24/14 0109 03/24/14 0647  WBC 10.3 8.7 8.1  NEUTROABS 8.4*  --   --   HGB 10.7* 10.2* 9.8*  HCT 34.3* 31.8* 31.4*  MCV 118.3* 117.3* 117.6*  PLT 206 191 200   Cardiac Enzymes: No results for input(s): CKTOTAL, CKMB, CKMBINDEX, TROPONINI in the last 168  hours. CBG: No results for input(s): GLUCAP in the last 168 hours. Iron Studies: No results for input(s): IRON, TIBC, TRANSFERRIN, FERRITIN in the last 72 hours. Studies/Results: Dg Chest 2 View  03/23/2014   CLINICAL DATA:  Cough since Friday, fever, shortness of breath, history hypertension, HIV, former smoker, end-stage renal disease  EXAM: CHEST  2 VIEW  COMPARISON:  04/21/2013  FINDINGS: Enlargement of cardiac silhouette.  Elongation of thoracic aorta.  Mediastinal contours and pulmonary vascularity normal.  Mild bibasilar atelectasis versus infiltrate.  Bronchitic changes.  Remaining lungs clear.  No pleural effusion or pneumothorax.  IMPRESSION: Bronchitic changes with bibasilar atelectasis versus infiltrate increased since previous exam.   Electronically Signed   By: Lavonia Dana M.D.   On: 03/23/2014 17:05   Review of Systems: Gen: Postive for fever. Denies chills, sweats, anorexia, fatigue, weakness, malaise, weight loss, and sleep disorder. Reports eating well HEENT: No visual complaints, No history of Retinopathy. Normal external appearance No Epistaxis or Sore throat. No sinusitis.  No sore throat CV: positive for chronic LE edema. Denies chest pain, angina, palpitations, syncope, orthopnea, PND,  and claudication. Resp: Denies dyspnea at rest, dyspnea with exercise, cough, sputum, wheezing, coughing up blood, and pleurisy. GI: Denies vomiting blood, jaundice, and fecal incontinence.   Denies dysphagia or odynophagia. GU : anuric MS: Denies joint pain, limitation of movement, and swelling, stiffness, low back pain, extremity pain. Denies muscle weakness, cramps, atrophy.  No use of non steroidal antiinflammatory drugs. Derm: Denies rash, itching, dry skin, hives, moles, warts, or unhealing ulcers.  Psych: Denies depression, anxiety, memory loss, suicidal ideation, hallucinations, paranoia, and confusion. Heme: Denies bruising, bleeding, and enlarged lymph nodes. Neuro: No headache.  No  diplopia. No dysarthria.  No dysphasia.  No history of CVA.  No Seizures. No paresthesias.  No weakness. Endocrine No DM.  No Thyroid disease.  No Adrenal disease.  Physical Exam: Filed Vitals:   03/23/14 1811 03/23/14 2147 03/23/14 2230 03/24/14 0557  BP: 108/62 103/64 87/55 88/58   Pulse: 78 72 71 70  Temp: 98.9 F (37.2 C) 99.5 F (37.5 C) 98.5 F (36.9 C) 99.4 F (37.4 C)  TempSrc: Oral Oral Oral Oral  Resp: 18 18 18 18   Height:   5\' 4"  (1.626 m)   Weight:   90.447 kg (199 lb 6.4 oz)   SpO2: 99% 91% 100% 100%     General: Well developed, well nourished, in no acute distress. Eating lunch.  Head: Normocephalic, atraumatic, sclera non-icteric, mucus membranes are moist Neck: Supple. JVD not elevated. Lungs: Dim bases. faint scattered rhonchi and occ exp wheeze. Breathing is unlabored. Heart: RRR with S1 S2. No murmurs, rubs, or gallops appreciated. Abdomen: Soft, non-tender, non-distended with normoactive bowel sounds. No rebound/guarding. No obvious abdominal masses. M-S:  Strength and tone appear normal for age. Lower extremities: +1 LE edema Neuro: Alert and oriented X 3. Moves all extremities spontaneously. Psych:  Responds to questions appropriately with a normal affect. Dialysis Access: L thigh AVG +b/t   Dialysis Orders:  TTS [place] 4 hrs    91kgs    2K/2Ca+   L thigh AVG   5900 u  heparin Profile 4 Aranesp 25 q week (tuesday)   calcititriol 1.25   Assessment/Plan: 1.  PNA- blood and sputum cultures pending/ flu negative. Infiltrate on xray. On Levaquin 2.  [place] -  [place]. HD pending tomorrow. K+4.3/ occasional sign offs 3.  Hypertension/volume  - 88/58 midodine/ chronic LE edema- at edw.  4.  Anemia  - hgb 9.8 cont ESA Tuesday. No Fe, last tsat 63 5.  Metabolic bone disease - [id number] renvela, phos 4.2 / last PTH 439 cont calcitriol and sensipar 6.  Nutrition - renal diet. multivit 7. HIV- antivirals per primary  Shelle Iron, NP [facility] [phone] 03/24/2014, 1:21 PM     I have seen and examined this patient and agree with the plan of care. Patient ambulating with the aid of a nurse. Appears mildly dyspneic having to rest but no accessory muscle use.  She will HD in the AM to resume TTS regimen.  Dwana Melena, MD 03/24/2014, 2:54 PM

## 2014-03-25 LAB — BASIC METABOLIC PANEL
Anion gap: 10 (ref 5–15)
BUN: 24 mg/dL — ABNORMAL HIGH (ref 6–23)
CO2: 32 mmol/L (ref 19–32)
Calcium: 8.1 mg/dL — ABNORMAL LOW (ref 8.4–10.5)
Chloride: 95 mmol/L — ABNORMAL LOW (ref 96–112)
Creatinine, Ser: 8.14 mg/dL — ABNORMAL HIGH (ref 0.50–1.10)
GFR calc Af Amer: 6 mL/min — ABNORMAL LOW (ref >90)
GFR calc non Af Amer: 5 mL/min — ABNORMAL LOW (ref >90)
Glucose, Bld: 108 mg/dL — ABNORMAL HIGH (ref 70–99)
Potassium: 3.9 mmol/L (ref 3.5–5.1)
Sodium: 137 mmol/L (ref 135–145)

## 2014-03-25 LAB — CBC
HCT: 31 % — ABNORMAL LOW (ref 36.0–46.0)
Hemoglobin: 9.7 g/dL — ABNORMAL LOW (ref 12.0–15.0)
MCH: 36.9 pg — ABNORMAL HIGH (ref 26.0–34.0)
MCHC: 31.3 g/dL (ref 30.0–36.0)
MCV: 117.9 fL — ABNORMAL HIGH (ref 78.0–100.0)
Platelets: 241 10*3/uL (ref 150–400)
RBC: 2.63 MIL/uL — ABNORMAL LOW (ref 3.87–5.11)
RDW: 13 % (ref 11.5–15.5)
WBC: 6.2 10*3/uL (ref 4.0–10.5)

## 2014-03-25 MED ORDER — HEPARIN SODIUM (PORCINE) 1000 UNIT/ML DIALYSIS
5900.0000 [IU] | Freq: Once | INTRAMUSCULAR | Status: AC
  Administered 2014-03-25: 5900 [IU] via INTRAVENOUS_CENTRAL

## 2014-03-25 MED ORDER — SODIUM CHLORIDE 0.9 % IV SOLN: 100.0000 mL | INTRAVENOUS | Status: DC | PRN

## 2014-03-25 MED ORDER — SODIUM CHLORIDE 0.9 % IV BOLUS (SEPSIS)
250.0000 mL | Freq: Once | INTRAVENOUS | Status: AC
  Administered 2014-03-25: 250 mL via INTRAVENOUS

## 2014-03-25 MED ORDER — SODIUM CHLORIDE 0.9 % IV SOLN: 250.0000 mL | INTRAVENOUS | Status: DC | PRN

## 2014-03-25 MED ORDER — NEPRO/CARBSTEADY PO LIQD: 237.0000 mL | ORAL | Status: DC | PRN

## 2014-03-25 MED ORDER — LIDOCAINE-PRILOCAINE 2.5-2.5 % EX CREA: 1.0000 "application " | TOPICAL_CREAM | CUTANEOUS | Status: DC | PRN

## 2014-03-25 MED ORDER — PENTAFLUOROPROP-TETRAFLUOROETH EX AERO: 1.0000 "application " | INHALATION_SPRAY | CUTANEOUS | Status: DC | PRN

## 2014-03-25 MED ORDER — ALTEPLASE 2 MG IJ SOLR
2.0000 mg | Freq: Once | INTRAMUSCULAR | Status: AC | PRN
  Filled 2014-03-25: qty 2

## 2014-03-25 MED ORDER — HEPARIN SODIUM (PORCINE) 1000 UNIT/ML DIALYSIS
1000.0000 [IU] | INTRAMUSCULAR | Status: DC | PRN
  Filled 2014-03-25: qty 1

## 2014-03-25 MED ORDER — SODIUM CHLORIDE 0.9 % IV BOLUS (SEPSIS)
500.0000 mL | Freq: Once | INTRAVENOUS | Status: AC
  Administered 2014-03-25: 500 mL via INTRAVENOUS

## 2014-03-25 MED ORDER — LIDOCAINE HCL (PF) 1 % IJ SOLN: 5.0000 mL | INTRAMUSCULAR | Status: DC | PRN

## 2014-03-25 MED ORDER — SODIUM CHLORIDE 0.9 % IV SOLN
INTRAVENOUS | Status: DC
  Administered 2014-03-25 – 2014-03-27 (×3): via INTRAVENOUS

## 2014-03-25 NOTE — Progress Notes (Signed)
PROGRESS NOTE  [name] [id number] DOB: May 12, 1955 DOA: 03/23/2014 PCP: Osa Craver, MD  HPI/Recap of past 24 hours: Patient is a 59 year old female with past history of HIV, end-stage renal disease on hemodialysis and obesity who was admitted to the hospitalist service on 3/17 for shortness of breath and cough following her dialysis session. Chest x-ray confirmed pneumonia. Patient has chronically low blood pressure, but there were no other signs of sepsis. Lactic acid level normal as were other vitals Flu titer checked and found to be negative.  Seen in hemodialysis today. Tolerating well. No complaints  Assessment/Plan: Principal Problem:  Active Problems:   Human immunodeficiency virus (HIV) disease: CD4 count at 260. Continue antiretrovirals   ESRD (end stage renal disease) on dialysis: Tolerated dialysis today. Post-HD, blood pressure soft occurring small fluid bolus   Obesity (BMI 30-39.9): Patient is criteria with BMI greater than 30   Hypotension, chronic: No evidence of sepsis. Restarted midodrine. Pressure predialysis stable  acute respiratory failure with hypoxia secondary to community-acquired pneumonia: Spoke with infectious disease. Given CD4 count 260, recommendation is for community acquired coverage with Levaquin, patient allergic to penicillins. Supplement oxygen as patient is hypoxic with out supplemental oxygen. Checking O2 sat off of room air down to 85% even without ambulation  Code Status: Full code  Family Communication: No family present  Disposition Plan: Continue in-hospital until oxygenation better, infection treated.  May need to end up going home on supplemental O2   Consultants:  Nephrology  Case discussed with infectious disease  Procedures:  Hemodialysis, possibly tomorrow  Antibiotics:  IV Levaquin 3/17-present   Objective: BP 89/49 mmHg  Pulse 64  Temp(Src) 98.8 F (37.1 C) (Oral)  Resp 16  Ht 5\' 4"  (1.626 m)  Wt  88.6 kg (195 lb 5.2 oz)  BMI 33.51 kg/m2  SpO2 100%  Intake/Output Summary (Last 24 hours) at 03/25/14 1650 Last data filed at 03/25/14 1531  Gross per 24 hour  Intake    240 ml  Output   2000 ml  Net  -1760 ml   Filed Weights   03/23/14 2230 03/25/14 0753 03/25/14 1205  Weight: 90.447 kg (199 lb 6.4 oz) 90.6 kg (199 lb 11.8 oz) 88.6 kg (195 lb 5.2 oz)    Exam:   General:  Alert and oriented 3, no acute distress  Cardiovascular: Regular rate and rhythm, [id number], 3/6 systolic ejection murmur  Respiratory: Clear to auscultation bilaterally  Abdomen: Soft, nontender, nondistended, positive bowel sounds  Musculoskeletal: No clubbing or cyanosis, trace pitting edema   Data Reviewed: Basic Metabolic Panel:  Recent Labs Lab 03/23/14 1634 03/24/14 0109 03/24/14 0647 03/25/14 0450  NA 140  --  140 137  K 4.0  --  4.3 3.9  CL 99  --  96 95*  CO2 31  --  35* 32  GLUCOSE 101*  --  81 108*  BUN 13  --  17 24*  CREATININE 4.80* 5.93* 6.51* 8.14*  CALCIUM 7.9*  --  8.5 8.1*  MG  --  2.0  --   --   PHOS  --  4.2  --   --    Liver Function Tests: No results for input(s): AST, ALT, ALKPHOS, BILITOT, PROT, ALBUMIN in the last 168 hours. No results for input(s): LIPASE, AMYLASE in the last 168 hours. No results for input(s): AMMONIA in the last 168 hours. CBC:  Recent Labs Lab 03/23/14 1634 03/24/14 0109 03/24/14 0647 03/25/14 0450  WBC 10.3 8.7 8.1 6.2  NEUTROABS 8.4*  --   --   --   HGB 10.7* 10.2* 9.8* 9.7*  HCT 34.3* 31.8* 31.4* 31.0*  MCV 118.3* 117.3* 117.6* 117.9*  PLT 206 191 200 241   Cardiac Enzymes:   No results for input(s): CKTOTAL, CKMB, CKMBINDEX, TROPONINI in the last 168 hours. BNP (last 3 results) No results for input(s): BNP in the last 8760 hours.  ProBNP (last 3 results)  Recent Labs  04/19/13 2236  PROBNP 39695.0*    CBG: No results for input(s): GLUCAP in the last 168 hours.  Recent Results (from the past 240 hour(s))  Culture,  blood (routine x 2) Call MD if unable to obtain prior to antibiotics being given     Status: None (Preliminary result)   Collection Time: 03/24/14  1:09 AM  Result Value Ref Range Status   Specimen Description BLOOD LEFT ARM  Final   Special Requests   Final    BOTTLES DRAWN AEROBIC AND ANAEROBIC 10CC BLUE 5 CC PURPLE   Culture   Final           BLOOD CULTURE RECEIVED NO GROWTH TO DATE CULTURE WILL BE HELD FOR 5 DAYS BEFORE ISSUING A FINAL NEGATIVE REPORT Performed at [facility]    Report Status PENDING  Incomplete  Culture, blood (routine x 2) Call MD if unable to obtain prior to antibiotics being given     Status: None (Preliminary result)   Collection Time: 03/24/14  1:13 AM  Result Value Ref Range Status   Specimen Description BLOOD LEFT HAND  Final   Special Requests BOTTLES DRAWN AEROBIC ONLY 10CC  Final   Culture   Final           BLOOD CULTURE RECEIVED NO GROWTH TO DATE CULTURE WILL BE HELD FOR 5 DAYS BEFORE ISSUING A FINAL NEGATIVE REPORT Performed at [facility]    Report Status PENDING  Incomplete     Studies: No results found.  Scheduled Meds: . abacavir  600 mg Oral Q1200  . albuterol  2.5 mg Nebulization QID  . aspirin  81 mg Oral Q1200  . calcitRIOL  1.25 mcg Oral Q T,Th,Sa-HD  . cinacalcet  30 mg Oral Q breakfast  . [START ON 03/28/2014] darbepoetin (ARANESP) injection - DIALYSIS  25 mcg Intravenous Q Tue-HD  . Darunavir Ethanolate  800 mg Oral Q breakfast  . heparin  5,000 Units Subcutaneous 3 times per day  . lamiVUDine  50 mg Oral Q1200  . levofloxacin (LEVAQUIN) IV  500 mg Intravenous Q48H  . midodrine  10 mg Oral BID WC  . multivitamin  1 tablet Oral QHS  . raltegravir  400 mg Oral BID  . ritonavir  100 mg Oral Q breakfast  . sevelamer carbonate  1,600 mg Oral TID WC  . sodium chloride  3 mL Intravenous Q12H  . tenofovir  300 mg Oral Q Fri    Continuous Infusions:    Time spent: 25 minutes  [facility] Pager [phone]. If 7PM-7AM, please contact night-coverage at [email or web address], password [email or web address] 03/25/2014, 4:50 PM  LOS: 2 days

## 2014-03-25 NOTE — Progress Notes (Signed)
[facility] Progress Note    Assessment/ Plan:   1. PNA- blood and sputum cultures pending/ flu negative. Infiltrate on xray. On Levaquin 2. ESRD - TTS [place]. HD  today. K/ occasional sign offs 3. Hypertension/volume - 88/58 midodine/ chronic LE edema- at edw.  4. Anemia - hgb 9.8 cont ESA Tuesday. No Fe, last tsat 63 5. Metabolic bone disease - [id number] renvela, phos 4.2 / last PTH 439 cont calcitriol and sensipar 6. Nutrition - renal diet. multivit 7. HIV- antivirals per primary  Subjective:   Seen on HD through left leg loop AVG. Qb/qd 400/800UF 2.5 Liters 2K for 1st hour and now will change to 4K VSS 130/65 AP/VP -80/200   Objective:   BP 124/66 mmHg  Pulse 63  Temp(Src) 98.4 F (36.9 C) (Oral)  Resp 18  Ht 5\' 4"  (1.626 m)  Wt 90.6 kg (199 lb 11.8 oz)  BMI 34.27 kg/m2  SpO2 100%  Intake/Output Summary (Last 24 hours) at 03/25/14 0848 Last data filed at 03/24/14 1538  Gross per 24 hour  Intake    662 ml  Output    280 ml  Net    382 ml   Weight change:   Physical Exam: General: Well developed, well nourished, in no acute distress. Eating lunch.  Head: Normocephalic, atraumatic, sclera non-icteric, mucus membranes are moist Neck: Supple. JVD not elevated. Lungs: Dim bases. faint scattered rhonchi and occ exp wheeze. Breathing is unlabored. Heart: RRR with S1 S2. No murmurs, rubs, or gallops appreciated. Abdomen: Soft, non-tender, non-distended with normoactive bowel sounds. No rebound/guarding. No obvious abdominal masses. M-S: Strength and tone appear normal for age. Lower extremities: +1 LE edema Neuro: Alert and oriented X 3. Moves all extremities spontaneously. Psych: Responds to questions appropriately with a normal affect. Dialysis Access: L thigh AVG +b/t   Imaging: Dg Chest 2 View  03/23/2014   CLINICAL DATA:  Cough since Friday, fever, shortness of breath, history hypertension, HIV, former smoker, end-stage renal disease  EXAM:  CHEST  2 VIEW  COMPARISON:  04/21/2013  FINDINGS: Enlargement of cardiac silhouette.  Elongation of thoracic aorta.  Mediastinal contours and pulmonary vascularity normal.  Mild bibasilar atelectasis versus infiltrate.  Bronchitic changes.  Remaining lungs clear.  No pleural effusion or pneumothorax.  IMPRESSION: Bronchitic changes with bibasilar atelectasis versus infiltrate increased since previous exam.   Electronically Signed   By: Lavonia Dana M.D.   On: 03/23/2014 17:05    Labs: BMET  Recent Labs Lab 03/23/14 1634 03/24/14 0109 03/24/14 0647 03/25/14 0450  NA 140  --  140 137  K 4.0  --  4.3 3.9  CL 99  --  96 95*  CO2 31  --  35* 32  GLUCOSE 101*  --  81 108*  BUN 13  --  17 24*  CREATININE 4.80* 5.93* 6.51* 8.14*  CALCIUM 7.9*  --  8.5 8.1*  PHOS  --  4.2  --   --    CBC  Recent Labs Lab 03/23/14 1634 03/24/14 0109 03/24/14 0647 03/25/14 0450  WBC 10.3 8.7 8.1 6.2  NEUTROABS 8.4*  --   --   --   HGB 10.7* 10.2* 9.8* 9.7*  HCT 34.3* 31.8* 31.4* 31.0*  MCV 118.3* 117.3* 117.6* 117.9*  PLT 206 191 200 241    Medications:    . abacavir  600 mg Oral Q1200  . albuterol  2.5 mg Nebulization QID  . aspirin  81 mg Oral Q1200  . calcitRIOL  1.25 mcg Oral Q T,Th,Sa-HD  . cinacalcet  30 mg Oral Q breakfast  . [START ON 03/28/2014] darbepoetin (ARANESP) injection - DIALYSIS  25 mcg Intravenous Q Tue-HD  . Darunavir Ethanolate  800 mg Oral Q breakfast  . heparin  5,000 Units Subcutaneous 3 times per day  . lamiVUDine  50 mg Oral Q1200  . levofloxacin (LEVAQUIN) IV  500 mg Intravenous Q48H  . midodrine  10 mg Oral BID WC  . multivitamin  1 tablet Oral QHS  . raltegravir  400 mg Oral BID  . ritonavir  100 mg Oral Q breakfast  . sevelamer carbonate  1,600 mg Oral TID WC  . sodium chloride  3 mL Intravenous Q12H  . tenofovir  300 mg Oral Q Dia Sitter, MD 03/25/2014, 8:48 AM

## 2014-03-26 MED ORDER — LEVOFLOXACIN 500 MG PO TABS
500.0000 mg | ORAL_TABLET | ORAL | Status: DC
Start: 2014-03-27 — End: 2014-03-27
  Administered 2014-03-27: 500 mg via ORAL
  Filled 2014-03-26: qty 1

## 2014-03-26 NOTE — Progress Notes (Signed)
[facility] Progress Note    Assessment/ Plan:   1. PNA- blood and sputum cultures pending/ flu negative. Infiltrate on xray. On Levaquin 2. ESRD - TTS [place]. Next HD Tues. No indication for HD today. K/ occasional sign offs 3. Hypertension/volume - 88/58 midodine/ chronic LE edema- at edw.  4. Anemia - hgb 9.8 cont ESA Tuesday. No Fe, last tsat 63 5. Metabolic bone disease - [id number] renvela, phos 4.2 / last PTH 439 cont calcitriol and sensipar 6. Nutrition - renal diet. multivit 7. HIV- antivirals per primary  Subjective:   Doing better and stated that her cough is improved. Denied f/c/n/v. Good appetite   Objective:   BP 90/51 mmHg  Pulse 65  Temp(Src) 98.7 F (37.1 C) (Oral)  Resp 18  Ht 5\' 4"  (1.626 m)  Wt 88.6 kg (195 lb 5.2 oz)  BMI 33.51 kg/m2  SpO2 98%  Intake/Output Summary (Last 24 hours) at 03/26/14 0815 Last data filed at 03/25/14 2140  Gross per 24 hour  Intake 1503.33 ml  Output   2000 ml  Net -496.67 ml   Weight change:   Physical Exam: General: Well developed, well nourished, in no acute distress. Eating lunch.  Head: Normocephalic, atraumatic, sclera non-icteric, mucus membranes are moist Neck: Supple. JVD not elevated. Lungs: Dim bases. faint scattered rhonchi and occ exp wheeze. Breathing is unlabored. Heart: RRR with S1 S2. No murmurs, rubs, or gallops appreciated. Abdomen: Soft, non-tender, non-distended with normoactive bowel sounds. No rebound/guarding. No obvious abdominal masses. M-S: Strength and tone appear normal for age. Lower extremities: +1 LE edema Neuro: Alert and oriented X 3. Moves all extremities spontaneously. Psych: Responds to questions appropriately with a normal affect. Dialysis Access: L thigh AVG +b/t   Imaging: No results found.  Labs: BMET  Recent Labs Lab 03/23/14 1634 03/24/14 0109 03/24/14 0647 03/25/14 0450  NA 140  --  140 137  K 4.0  --  4.3 3.9  CL 99  --  96 95*  CO2 31  --   35* 32  GLUCOSE 101*  --  81 108*  BUN 13  --  17 24*  CREATININE 4.80* 5.93* 6.51* 8.14*  CALCIUM 7.9*  --  8.5 8.1*  PHOS  --  4.2  --   --    CBC  Recent Labs Lab 03/23/14 1634 03/24/14 0109 03/24/14 0647 03/25/14 0450  WBC 10.3 8.7 8.1 6.2  NEUTROABS 8.4*  --   --   --   HGB 10.7* 10.2* 9.8* 9.7*  HCT 34.3* 31.8* 31.4* 31.0*  MCV 118.3* 117.3* 117.6* 117.9*  PLT 206 191 200 241    Medications:    . abacavir  600 mg Oral Q1200  . albuterol  2.5 mg Nebulization QID  . aspirin  81 mg Oral Q1200  . calcitRIOL  1.25 mcg Oral Q T,Th,Sa-HD  . cinacalcet  30 mg Oral Q breakfast  . [START ON 03/28/2014] darbepoetin (ARANESP) injection - DIALYSIS  25 mcg Intravenous Q Tue-HD  . Darunavir Ethanolate  800 mg Oral Q breakfast  . heparin  5,000 Units Subcutaneous 3 times per day  . lamiVUDine  50 mg Oral Q1200  . levofloxacin (LEVAQUIN) IV  500 mg Intravenous Q48H  . midodrine  10 mg Oral BID WC  . multivitamin  1 tablet Oral QHS  . raltegravir  400 mg Oral BID  . ritonavir  100 mg Oral Q breakfast  . sevelamer carbonate  1,600 mg Oral TID WC  .  sodium chloride  3 mL Intravenous Q12H  . tenofovir  300 mg Oral Q Dia Sitter, MD 03/26/2014, 8:15 AM

## 2014-03-26 NOTE — Progress Notes (Signed)
PROGRESS NOTE  [name] [id number] DOB: [birth year] DOA: 03/23/2014 PCP: Osa Craver, MD  HPI/Recap of past 24 hours: Patient is a 59 year old female with past history of HIV, end-stage renal disease on hemodialysis and obesity who was admitted to the hospitalist service on 3/17 for shortness of breath and cough following her dialysis session. Chest x-ray confirmed pneumonia. Patient has chronically low blood pressure, but there were no other signs of sepsis. Lactic acid level normal as were other vitals Flu titer checked and found to be negative.  Today patient is feeling okay. We will able to clarify that she does use 2 L nasal cannula oxygen at home. Remains with chronic hypertension, otherwise doing okay.  Assessment/Plan: Principal Problem:  Active Problems:   Human immunodeficiency virus (HIV) disease: CD4 count at 260. Continue antiretrovirals   ESRD (end stage renal disease) on dialysis: Tolerated dialysis today. Post-HD, blood pressure soft occurring small fluid bolus   Obesity (BMI 30-39.9): Patient is criteria with BMI greater than 30   Hypotension, chronic: No evidence of sepsis. Restarted midodrine. Pressure predialysis stable  acute respiratory failure with hypoxia secondary to community-acquired pneumonia: Spoke with infectious disease. Given CD4 count 260, recommendation is for community acquired coverage with Levaquin, patient allergic to penicillins. Supplement oxygen as patient is hypoxic with out supplemental oxygen. Checking O2 sat on baseline 2 L Code Status: Full code  Family Communication: No family present  Disposition Plan: Back to skilled nursing tomorrow   Consultants:  Nephrology  Case discussed with infectious disease  Procedures:  Hemodialysis 3/19   Antibiotics:  IV Levaquin 3/17-present   Objective: BP 90/51 mmHg  Pulse 65  Temp(Src) 98.7 F (37.1 C) (Oral)  Resp 18  Ht 5\' 4"  (1.626 m)  Wt 88.6 kg (195 lb 5.2 oz)  BMI  33.51 kg/m2  SpO2 98%  Intake/Output Summary (Last 24 hours) at 03/26/14 1132 Last data filed at 03/26/14 [id number]  Gross per 24 hour  Intake 1743.33 ml  Output   2000 ml  Net -256.67 ml   Filed Weights   03/23/14 2230 03/25/14 0753 03/25/14 1205  Weight: 90.447 kg (199 lb 6.4 oz) 90.6 kg (199 lb 11.8 oz) 88.6 kg (195 lb 5.2 oz)    Exam:   General:  Alert and oriented 3, no acute distress  Cardiovascular: Regular rate and rhythm, Q000111Q, 3/6 systolic ejection murmur  Respiratory: Decreased breath sounds throughout  Abdomen: Soft, nontender, nondistended, positive bowel sounds  Musculoskeletal: No clubbing or cyanosis, trace pitting edema   Data Reviewed: Basic Metabolic Panel:  Recent Labs Lab 03/23/14 1634 03/24/14 0109 03/24/14 0647 03/25/14 0450  NA 140  --  140 137  K 4.0  --  4.3 3.9  CL 99  --  96 95*  CO2 31  --  35* 32  GLUCOSE 101*  --  81 108*  BUN 13  --  17 24*  CREATININE 4.80* 5.93* 6.51* 8.14*  CALCIUM 7.9*  --  8.5 8.1*  MG  --  2.0  --   --   PHOS  --  4.2  --   --    Liver Function Tests: No results for input(s): AST, ALT, ALKPHOS, BILITOT, PROT, ALBUMIN in the last 168 hours. No results for input(s): LIPASE, AMYLASE in the last 168 hours. No results for input(s): AMMONIA in the last 168 hours. CBC:  Recent Labs Lab 03/23/14 1634 03/24/14 0109 03/24/14 0647 03/25/14 0450  WBC 10.3 8.7 8.1 6.2  NEUTROABS 8.4*  --   --   --  HGB 10.7* 10.2* 9.8* 9.7*  HCT 34.3* 31.8* 31.4* 31.0*  MCV 118.3* 117.3* 117.6* 117.9*  PLT 206 191 200 241   Cardiac Enzymes:   No results for input(s): CKTOTAL, CKMB, CKMBINDEX, TROPONINI in the last 168 hours. BNP (last 3 results) No results for input(s): BNP in the last 8760 hours.  ProBNP (last 3 results)  Recent Labs  04/19/13 2236  PROBNP 39695.0*    CBG: No results for input(s): GLUCAP in the last 168 hours.  Recent Results (from the past 240 hour(s))  Culture, blood (routine x 2) Call MD if  unable to obtain prior to antibiotics being given     Status: None (Preliminary result)   Collection Time: 03/24/14  1:09 AM  Result Value Ref Range Status   Specimen Description BLOOD LEFT ARM  Final   Special Requests   Final    BOTTLES DRAWN AEROBIC AND ANAEROBIC 10CC BLUE 5 CC PURPLE   Culture   Final           BLOOD CULTURE RECEIVED NO GROWTH TO DATE CULTURE WILL BE HELD FOR 5 DAYS BEFORE ISSUING A FINAL NEGATIVE REPORT Performed at [facility]    Report Status PENDING  Incomplete  Culture, blood (routine x 2) Call MD if unable to obtain prior to antibiotics being given     Status: None (Preliminary result)   Collection Time: 03/24/14  1:13 AM  Result Value Ref Range Status   Specimen Description BLOOD LEFT HAND  Final   Special Requests BOTTLES DRAWN AEROBIC ONLY 10CC  Final   Culture   Final           BLOOD CULTURE RECEIVED NO GROWTH TO DATE CULTURE WILL BE HELD FOR 5 DAYS BEFORE ISSUING A FINAL NEGATIVE REPORT Performed at [facility]    Report Status PENDING  Incomplete     Studies: No results found.  Scheduled Meds: . abacavir  600 mg Oral Q1200  . albuterol  2.5 mg Nebulization QID  . aspirin  81 mg Oral Q1200  . calcitRIOL  1.25 mcg Oral Q T,Th,Sa-HD  . cinacalcet  30 mg Oral Q breakfast  . [START ON 03/28/2014] darbepoetin (ARANESP) injection - DIALYSIS  25 mcg Intravenous Q Tue-HD  . Darunavir Ethanolate  800 mg Oral Q breakfast  . heparin  5,000 Units Subcutaneous 3 times per day  . lamiVUDine  50 mg Oral Q1200  . [START ON 03/27/2014] levofloxacin  500 mg Oral Q48H  . midodrine  10 mg Oral BID WC  . multivitamin  1 tablet Oral QHS  . raltegravir  400 mg Oral BID  . ritonavir  100 mg Oral Q breakfast  . sevelamer carbonate  1,600 mg Oral TID WC  . sodium chloride  3 mL Intravenous Q12H  . tenofovir  300 mg Oral Q Fri    Continuous Infusions: . sodium chloride 100 mL/hr at 03/26/14 0524     Time spent: 25 minutes  [facility] Pager [phone]. If 7PM-7AM, please contact night-coverage at [email or web address], password [facility] 03/26/2014, 11:32 AM  LOS: 3 days

## 2014-03-26 NOTE — Progress Notes (Signed)
   03/26/14 1152  Oxygen Therapy/Pulse Ox  O2 Device Room Air (patient went to the restroom without 02)  SpO2 (!) 54 %  Patient went to the restroom without 02.  She did not complain of dyspnea.  Returned patient to 2lpm [place] and 02 saturation come up to 98%.

## 2014-03-26 NOTE — Progress Notes (Signed)
NT informed RN of BP 80s/40s. RN attempted to check manual BP. BP very faint. Second RN asked to check manual BP. Second RN found BP to be very faint also, difficult to distinguish. On-call provider notified of pt's continued low BP. Provider instructed to continue to monitor. New call parameters requested. Will continue to monitor.

## 2014-03-27 DIAGNOSIS — J9611 Chronic respiratory failure with hypoxia: Secondary | ICD-10-CM | POA: Insufficient documentation

## 2014-03-27 DIAGNOSIS — J9621 Acute and chronic respiratory failure with hypoxia: Secondary | ICD-10-CM

## 2014-03-27 LAB — HIV-1 RNA, QUALITATIVE, TMA: HIV-1 RNA, Qualitative, TMA: NEGATIVE

## 2014-03-27 MED ORDER — HYDROCODONE-ACETAMINOPHEN 5-325 MG PO TABS: ORAL_TABLET | ORAL | Status: DC

## 2014-03-27 MED ORDER — ALBUTEROL SULFATE (2.5 MG/3ML) 0.083% IN NEBU: 2.5000 mg | INHALATION_SOLUTION | Freq: Two times a day (BID) | RESPIRATORY_TRACT | Status: DC

## 2014-03-27 MED ORDER — ALBUTEROL SULFATE (2.5 MG/3ML) 0.083% IN NEBU: 2.5000 mg | INHALATION_SOLUTION | RESPIRATORY_TRACT | Status: DC | PRN

## 2014-03-27 NOTE — Progress Notes (Signed)
[facility] Progress Note   Subjective: No further fevers, cough better. Wants to go home.    Assessment/ Plan:   1. PNA- blood cx's neg, on Levaquin, better.  2. ESRD - [place] 3. Hypertension/volume - 88/58 midodine/ chronic LE edema- at edw.  4. Anemia - hgb 9.8 cont ESA Tuesday. No Fe, last tsat 63 5. Metabolic bone disease - 99991111 renvela, phos 4.2 / last PTH 439 cont calcitriol and sensipar 6. Nutrition - renal diet. multivit 7. HIV- antivirals per primary 8. Dispo - stable from renal standpoint       Objective:   BP 92/50 mmHg  Pulse 74  Temp(Src) 98.2 F (36.8 C) (Oral)  Resp 18  Ht 5\' 4"  (1.626 m)  Wt 88.6 kg (195 lb 5.2 oz)  BMI 33.51 kg/m2  SpO2 99%  Intake/Output Summary (Last 24 hours) at 03/27/14 0856 Last data filed at 03/27/14 0500  Gross per 24 hour  Intake   2040 ml  Output      0 ml  Net   2040 ml   Weight change:   Physical Exam: General: Well developed, well nourished, in no acute distress. Eating lunch.  Head: Normocephalic, atraumatic, sclera non-icteric, mucus membranes are moist Neck: Supple. JVD not elevated. Lungs: Dim bases. faint scattered rhonchi and occ exp wheeze. Breathing is unlabored. Heart: RRR with S1 S2. No murmurs, rubs, or gallops appreciated. Abdomen: Soft, non-tender, non-distended with normoactive bowel sounds. No rebound/guarding. No obvious abdominal masses. M-S: Strength and tone appear normal for age. Lower extremities: +1 LE edema Neuro: Alert and oriented X 3. Moves all extremities spontaneously. Psych: Responds to questions appropriately with a normal affect. Dialysis Access: L thigh AVG +b/t   Imaging: No results found.  Labs: BMET  Recent Labs Lab 03/23/14 1634 03/24/14 0109 03/24/14 0647 03/25/14 0450  NA 140  --  140 137  K 4.0  --  4.3 3.9  CL 99  --  96 95*  CO2 31  --  35* 32  GLUCOSE 101*  --  81 108*  BUN 13  --  17 24*  CREATININE 4.80* 5.93* 6.51* 8.14*   CALCIUM 7.9*  --  8.5 8.1*  PHOS  --  4.2  --   --    CBC  Recent Labs Lab 03/23/14 1634 03/24/14 0109 03/24/14 0647 03/25/14 0450  WBC 10.3 8.7 8.1 6.2  NEUTROABS 8.4*  --   --   --   HGB 10.7* 10.2* 9.8* 9.7*  HCT 34.3* 31.8* 31.4* 31.0*  MCV 118.3* 117.3* 117.6* 117.9*  PLT 206 191 200 241    Medications:    . abacavir  600 mg Oral Q1200  . albuterol  2.5 mg Nebulization QID  . aspirin  81 mg Oral Q1200  . calcitRIOL  1.25 mcg Oral Q T,Th,Sa-HD  . cinacalcet  30 mg Oral Q breakfast  . [START ON 03/28/2014] darbepoetin (ARANESP) injection - DIALYSIS  25 mcg Intravenous Q Tue-HD  . Darunavir Ethanolate  800 mg Oral Q breakfast  . heparin  5,000 Units Subcutaneous 3 times per day  . lamiVUDine  50 mg Oral Q1200  . levofloxacin  500 mg Oral Q48H  . midodrine  10 mg Oral BID WC  . multivitamin  1 tablet Oral QHS  . raltegravir  400 mg Oral BID  . ritonavir  100 mg Oral Q breakfast  . sevelamer carbonate  1,600 mg Oral TID WC  . tenofovir  300 mg Oral Q  Ludwig Clarks      Otelia Santee, MD 03/27/2014, 8:56 AM

## 2014-03-27 NOTE — Progress Notes (Signed)
[facility] here to transport pt. Back to [facility]. Called report to receiving nurse (Keisha). DNR gold form filled out and given to [facility] personal as well as packet info. Pt. Alert and aware of transport. No skin issues. Left fistula graft intact and positive x3. Called family and made them aware of pt. Transport. All need met. No further needs noted at this time.  

## 2014-03-27 NOTE — Clinical Social Work Note (Signed)
Per MD patient ready to DC back to [facility] [phone]. RN, patient/family (patient's states she has already made her family aware of DC), and facility notified of patient's DC. RN given number for report. DC packet on patient's chart. Ambulance transport requested for patient for 12:00PM. CSW signing off at this time.   Liz Beach MSW, [place], [place], JI:[phone]

## 2014-03-27 NOTE — Discharge Summary (Signed)
Discharge Summary  [name] [id number] DOB: April 13, 1955  PCP: Osa Craver, MD  Admit date: 03/23/2014 Discharge date: 03/27/2014  Time spent: 25 minutes  Recommendations for Outpatient Follow-up:  1. Patient will follow-up with her primary care doctor the internal medicine clinic as well as her infectious disease doctor as scheduled   Discharge Diagnoses:  Active Hospital Problems   Diagnosis Date Noted  . CAP (community acquired pneumonia) 03/23/2014  . Obesity (BMI 30-39.9) 03/24/2014  . Hypotension, chronic 03/24/2014  . ESRD (end stage renal disease) on dialysis 03/23/2014  . Human immunodeficiency virus (HIV) disease 03/19/2007  Acute on chronic respiratory failure with hypoxia   Resolved Hospital Problems   Diagnosis Date Noted Date Resolved  No resolved problems to display.    Discharge Condition: Improved, being discharged home  Diet recommendation: Renal diet  Filed Weights   03/23/14 2230 03/25/14 0753 03/25/14 1205  Weight: 90.447 kg (199 lb 6.4 oz) 90.6 kg (199 lb 11.8 oz) 88.6 kg (195 lb 5.2 oz)    History of present illness:  Patient is a 59 year old female with past history of HIV, end-stage renal disease on hemodialysis and obesity who was admitted to the hospitalist service on 3/17 for shortness of breath and cough following her dialysis session. Chest x-ray confirmed pneumonia. Patient has chronically low blood pressure, but there were no other signs of sepsis. Lactic acid level normal as were other vitals Flu titer checked and found to be negative.  Hospital Course:  Principal Problem:  Acute on chronic respiratory failure secondary to CAP (community acquired pneumonia): Spoke with infectious disease. Given CD4 count of 260, they recommended treating her solely for community-acquired pneumonia. Patient is allergic to penicillin so covered her with Levaquin. Patient initially required supplemental oxygen as high as 4 L, but over several days,  came down to baseline of 2 L Active Problems:   Human immunodeficiency virus (HIV) disease: CD4 count checked and found to be at 260. Patient will continue her antiretrovirals.    ESRD (end stage renal disease) on dialysis: Seen by nephrology. Had dialysis done 3/19 and tolerated well.    Obesity (BMI 30-39.9): Patient meets criteria with BMI greater than 30.    Hypotension, chronic: No evidence of sepsis. At times patient occurred fluid boluses, most often after dialysis. Restarted on her midodrine. She has been a symptom medic from this   Procedures:  None  Consultations:  Case discussed with infectious disease  Discharge Exam: BP 92/50 mmHg  Pulse 74  Temp(Src) 98.2 F (36.8 C) (Oral)  Resp 18  Ht 5\' 4"  (1.626 m)  Wt 88.6 kg (195 lb 5.2 oz)  BMI 33.51 kg/m2  SpO2 98%  General: Alert and oriented 3, no acute distress Cardiovascular: Regular rate and rhythm, Q000111Q, 2/6 systolic ejection murmur Respiratory: Clear to auscultation bilaterally  Discharge Instructions You were cared for by a hospitalist during your hospital stay. If you have any questions about your discharge medications or the care you received while you were in the hospital after you are discharged, you can call the unit and asked to speak with the hospitalist on call if the hospitalist that took care of you is not available. Once you are discharged, your primary care physician will handle any further medical issues. Please note that NO REFILLS for any discharge medications will be authorized once you are discharged, as it is imperative that you return to your primary care physician (or establish a relationship with a primary care physician if you  do not have one) for your aftercare needs so that they can reassess your need for medications and monitor your lab values.  Discharge Instructions    Diet renal [id number]    Complete by:  As directed      Increase activity slowly    Complete by:  As directed               Medication List    STOP taking these medications        b complex vitamins tablet     calcium acetate 667 MG capsule  Commonly known as:  PHOSLO     clonazePAM 0.5 MG tablet  Commonly known as:  KLONOPIN      TAKE these medications        abacavir 300 MG tablet  Commonly known as:  ZIAGEN  Take 600 mg by mouth daily at 12 noon.     acetaminophen 325 MG tablet  Commonly known as:  TYLENOL  Take 650 mg by mouth every 4 (four) hours as needed for mild pain.     aspirin 81 MG chewable tablet  Chew 81 mg by mouth daily at 12 noon.     CERTAGEN PO  Take 1 tablet by mouth daily at 12 noon.     cinacalcet 30 MG tablet  Commonly known as:  SENSIPAR  Take 30 mg by mouth every morning.     Darunavir Ethanolate 800 MG tablet  Commonly known as:  PREZISTA  Take 1 tablet (800 mg total) by mouth daily with breakfast.     HYDROcodone-acetaminophen 5-325 MG per tablet  Commonly known as:  NORCO/VICODIN  Take one tablet by mouth every 4 hours as needed for pain     lamiVUDine 10 MG/ML solution  Commonly known as:  EPIVIR  Take 50 mg by mouth daily at 12 noon.     midodrine 10 MG tablet  Commonly known as:  PROAMATINE  Take 10 mg by mouth 2 (two) times daily. At 630am and 10pm     raltegravir 400 MG tablet  Commonly known as:  ISENTRESS  Take 1 tablet (400 mg total) by mouth 2 (two) times daily.     ritonavir 100 MG Tabs tablet  Commonly known as:  NORVIR  Take 1 tablet (100 mg total) by mouth daily with breakfast.     sevelamer carbonate 800 MG tablet  Commonly known as:  RENVELA  Take 1,600 mg by mouth 3 (three) times daily with meals.     tenofovir 300 MG tablet  Commonly known as:  VIREAD  Take 300 mg by mouth once a week. On Fridays       Allergies  Allergen Reactions  . Penicillins Nausea And Vomiting and Rash      The results of significant diagnostics from this hospitalization (including imaging, microbiology, ancillary and laboratory) are  listed below for reference.    Significant Diagnostic Studies: Dg Chest 2 View  03/23/2014   CLINICAL DATA:  Cough since Friday, fever, shortness of breath, history hypertension, HIV, former smoker, end-stage renal disease  EXAM: CHEST  2 VIEW  COMPARISON:  04/21/2013  FINDINGS: Enlargement of cardiac silhouette.  Elongation of thoracic aorta.  Mediastinal contours and pulmonary vascularity normal.  Mild bibasilar atelectasis versus infiltrate.  Bronchitic changes.  Remaining lungs clear.  No pleural effusion or pneumothorax.  IMPRESSION: Bronchitic changes with bibasilar atelectasis versus infiltrate increased since previous exam.   Electronically Signed   By: Crist Infante.D.  On: 03/23/2014 17:05    Microbiology: Recent Results (from the past 240 hour(s))  Culture, blood (routine x 2) Call MD if unable to obtain prior to antibiotics being given     Status: None (Preliminary result)   Collection Time: 03/24/14  1:09 AM  Result Value Ref Range Status   Specimen Description BLOOD LEFT ARM  Final   Special Requests   Final    BOTTLES DRAWN AEROBIC AND ANAEROBIC 10CC BLUE 5 CC PURPLE   Culture   Final           BLOOD CULTURE RECEIVED NO GROWTH TO DATE CULTURE WILL BE HELD FOR 5 DAYS BEFORE ISSUING A FINAL NEGATIVE REPORT Performed at [facility]    Report Status PENDING  Incomplete  Culture, blood (routine x 2) Call MD if unable to obtain prior to antibiotics being given     Status: None (Preliminary result)   Collection Time: 03/24/14  1:13 AM  Result Value Ref Range Status   Specimen Description BLOOD LEFT HAND  Final   Special Requests BOTTLES DRAWN AEROBIC ONLY 10CC  Final   Culture   Final           BLOOD CULTURE RECEIVED NO GROWTH TO DATE CULTURE WILL BE HELD FOR 5 DAYS BEFORE ISSUING A FINAL NEGATIVE REPORT Performed at [facility]    Report Status PENDING  Incomplete     Labs: Basic Metabolic Panel:  Recent Labs Lab 03/23/14 1634 03/24/14 0109  03/24/14 0647 03/25/14 0450  NA 140  --  140 137  K 4.0  --  4.3 3.9  CL 99  --  96 95*  CO2 31  --  35* 32  GLUCOSE 101*  --  81 108*  BUN 13  --  17 24*  CREATININE 4.80* 5.93* 6.51* 8.14*  CALCIUM 7.9*  --  8.5 8.1*  MG  --  2.0  --   --   PHOS  --  4.2  --   --    Liver Function Tests: No results for input(s): AST, ALT, ALKPHOS, BILITOT, PROT, ALBUMIN in the last 168 hours. No results for input(s): LIPASE, AMYLASE in the last 168 hours. No results for input(s): AMMONIA in the last 168 hours. CBC:  Recent Labs Lab 03/23/14 1634 03/24/14 0109 03/24/14 0647 03/25/14 0450  WBC 10.3 8.7 8.1 6.2  NEUTROABS 8.4*  --   --   --   HGB 10.7* 10.2* 9.8* 9.7*  HCT 34.3* 31.8* 31.4* 31.0*  MCV 118.3* 117.3* 117.6* 117.9*  PLT 206 191 200 241   Cardiac Enzymes: No results for input(s): CKTOTAL, CKMB, CKMBINDEX, TROPONINI in the last 168 hours. BNP: BNP (last 3 results) No results for input(s): BNP in the last 8760 hours.  ProBNP (last 3 results)  Recent Labs  04/19/13 2236  PROBNP 39695.0*    CBG: No results for input(s): GLUCAP in the last 168 hours.     Signed:  Annita Brod  [facility] 03/27/2014, 10:11 AM

## 2014-03-27 NOTE — Clinical Social Work Psychosocial (Signed)
Clinical Social Work Department BRIEF PSYCHOSOCIAL ASSESSMENT 03/27/2014  Patient:  [name], [name]     Account Number:  [id number]     Admit date:  03/23/2014  Clinical Social Worker:  Lovey Newcomer  Date/Time:  03/27/2014 11:05 AM  Referred by:  Physician  Date Referred:  03/27/2014 Referred for  SNF Placement   Other Referral:   NA   Interview type:  Patient Other interview type:   Patient alert and oriented at time of assessment.    PSYCHOSOCIAL DATA Living Status:  FACILITY Admitted from facility:  [facility] Level of care:  [place] Primary support name:  [name] and [name] Primary support relationship to patient:  FAMILY Degree of support available:   Support is strong, per patient.    CURRENT CONCERNS Current Concerns  Post-Acute Placement   Other Concerns:   NA    SOCIAL WORK ASSESSMENT / PLAN CSW met with patient at bedside to complete assessment. Patient was admitted from [facility] for SOB, cough, and fever. Patient shows good insight into her understanding of her diagnosis and reason for admission. Patient reports that she plans to return to [facility] once she has been discharge. CSW offerred to communicate this with family, but patient states she has already updated her family regarding plans for DC 3/21. Patient presented with normal affect and willingness to engage with CSW.   Assessment/plan status:  Psychosocial Support/Ongoing Assessment of Needs Other assessment/ plan:   Complete Fl2, Fax, PASRR   Information/referral to community resources:   CSW contact information and SNF list given.    PATIENT'S/FAMILY'S RESPONSE TO PLAN OF CARE: Patient plans to return to [facility] at discharge. CSW will assist with DC once medically stable.       Liz Beach MSW, [place], [place], [phone]

## 2014-03-30 LAB — CULTURE, BLOOD (ROUTINE X 2)
Culture: NO GROWTH
Culture: NO GROWTH

## 2014-03-31 ENCOUNTER — Ambulatory Visit
Admission: RE | Admit: 2014-03-31 | Discharge: 2014-03-31 | Disposition: A | Discharge status: Home / Self Care | Payer: Medicaid Other | Source: Ambulatory Visit | Attending: Internal Medicine | Admitting: Internal Medicine

## 2014-03-31 DIAGNOSIS — N6310 Unspecified lump in the right breast, unspecified quadrant: Secondary | ICD-10-CM

## 2014-04-12 ENCOUNTER — Encounter: Payer: Self-pay | Admitting: Infectious Disease

## 2014-04-12 ENCOUNTER — Ambulatory Visit (INDEPENDENT_AMBULATORY_CARE_PROVIDER_SITE_OTHER): Payer: Medicaid Other | Admitting: Infectious Disease

## 2014-04-12 VITALS — BP 96/62 | HR 75 | Temp 97.9°F | Wt 198.0 lb

## 2014-04-12 DIAGNOSIS — J189 Pneumonia, unspecified organism: Secondary | ICD-10-CM | POA: Diagnosis not present

## 2014-04-12 DIAGNOSIS — B20 Human immunodeficiency virus [HIV] disease: Secondary | ICD-10-CM | POA: Diagnosis not present

## 2014-04-12 DIAGNOSIS — N186 End stage renal disease: Secondary | ICD-10-CM

## 2014-04-12 DIAGNOSIS — Z992 Dependence on renal dialysis: Secondary | ICD-10-CM

## 2014-04-12 DIAGNOSIS — F0280 Dementia in other diseases classified elsewhere without behavioral disturbance: Secondary | ICD-10-CM

## 2014-04-12 NOTE — Progress Notes (Signed)
   Subjective:    Patient ID: [name], female    DOB: April 26, 1955, 59 y.o.   MRN: [id number]  HPI Ms. Lillard is a 89 r/o female with a PMH of HIV and ESRD with HD who is currently living in [facility] SNF. Her VL was not obtained with her last lab draw. Her Cd4 was 260. She continues taking her medications which include Prezista, Epivir, Isentress, Norvir, Abacavir and Tenofovir. Her Genosure archive from May 2015 showed resistance pattern from  a K103 causing R to EFV, M184V --> R to emtricitabine. She denies any other complaints. She did go to the ED for CAP and was treated with Levaquin. She states her symptoms have resolved. No other complaints.    Review of Systems Constitutional: Negative for fever, chills, diaphoresis, activity change, appetite change, fatigue and unexpected weight change.  HENT: Negative for congestion, rhinorrhea, sinus pressure, sneezing, sore throat and trouble swallowing.  Eyes: Negative for photophobia and visual disturbance.  Respiratory: Negative for cough, chest tightness, shortness of breath, wheezing and stridor.  Cardiovascular: Negative for chest pain, palpitations and leg swelling.  Gastrointestinal: Negative for nausea, vomiting, abdominal pain, diarrhea, constipation, blood in stool, abdominal distention and anal bleeding.  Genitourinary: Negative for dysuria, hematuria, flank pain and difficulty urinating.  Musculoskeletal: Negative for myalgias, back pain, joint swelling, arthralgias and gait problem.  Skin: Negative for color change, pallor and rash.  Neurological: Negative for dizziness, tremors, weakness and light-headedness.  Hematological: Negative for adenopathy. Does not bruise/bleed easily.  Psychiatric/Behavioral: Negative for behavioral problems, confusion, sleep disturbance, dysphoric mood, decreased concentration and agitation.     Objective:   Physical Exam HENT:  Head: Normocephalic and atraumatic.  Mouth/Throat:  Oropharynx is clear and moist. No oropharyngeal exudate.  Eyes: Conjunctivae and EOM are normal. No scleral icterus.  Neck: Normal range of motion. Neck supple. No JVD present.  Cardiovascular: Normal rate and regular rhythm. + bruit and thrill to LUE AV fistula  Pulmonary/Chest: Effort normal. No respiratory distress. She has no wheezes.  Abdominal: She exhibits no distension.  Musculoskeletal: She exhibits no edema or tenderness.  Lymphadenopathy:   She has no cervical adenopathy.  Neurological: She is alert and oriented to person, place, and time. She exhibits normal muscle tone. Coordination normal.  Skin: Skin is warm and dry. She is not diaphoretic. No erythema. No pallor.  Psychiatric: She has a normal mood and affect. Her behavior is normal. Thought content normal.        Assessment & Plan:  HIV: Redraw labs today F/U 3 months

## 2014-04-12 NOTE — Progress Notes (Signed)
   Subjective:    Patient ID: [name], female    DOB: [birth year], 59 y.o.   MRN: [id number]  HPI  For details please see NP student note from today  [name] has been rx for pneumonia during hospitalization recently remains on HD.  Her regimen contains more ARV than she necessarily needs based on her genosure archive so will drop her ABC.  She does not seem to understand much of what is discussed during todays interview.  Review of Systems See NP note otherwise negative    Objective:   Physical Exam  Constitutional: She appears well-developed and well-nourished. No distress.  HENT:  Head: Normocephalic and atraumatic.  Mouth/Throat: No oropharyngeal exudate.  Eyes: Conjunctivae and EOM are normal. No scleral icterus.  Neck: Normal range of motion. Neck supple.  Cardiovascular: Normal rate, regular rhythm and normal heart sounds.  Exam reveals no gallop and no friction rub.   No murmur heard. Pulmonary/Chest: Effort normal and breath sounds normal. No respiratory distress. She has no wheezes. She has no rales. She exhibits no tenderness.  Abdominal: Soft. Bowel sounds are normal. She exhibits no distension.  Musculoskeletal: She exhibits no edema or tenderness.  Neurological: She is alert. She exhibits normal muscle tone. Coordination normal.  Skin: Skin is warm and dry. No rash noted. She is not diaphoretic. No erythema. No pallor.  Psychiatric: Cognition and memory are impaired.          Assessment & Plan:   HIV: dc her ABC, check labs today. Continue weekly TNF, daily 3tc to sensitize her virus to TNF, continue BID isentress and daily boosted Prezista  I spent greater than 25 minutes with the patient including greater than 50% of time in face to face counsel of the patient and in coordination of their care.   ESRD on HD: followup with [facility] associated Pneumonia: appears resolved  HIV neurocogntive disorder, +/- multifactorial dementia:  rx HIV, supportive care otherwise per PCP

## 2014-04-13 LAB — HIV-1 RNA QUANT-NO REFLEX-BLD
HIV 1 RNA Quant: <20 copies/mL (ref <20)
HIV-1 RNA Quant, Log: <1.3 {Log} (ref <1.30)

## 2014-04-13 LAB — RPR

## 2014-04-14 LAB — T-HELPER CELL (CD4) - (RCID CLINIC ONLY)
CD4 % Helper T Cell: 21 % — ABNORMAL LOW (ref 33–55)
CD4 T Cell Abs: 350 /uL — ABNORMAL LOW (ref 400–2700)

## 2014-06-01 ENCOUNTER — Other Ambulatory Visit: Payer: Self-pay | Admitting: Internal Medicine

## 2014-06-01 DIAGNOSIS — N6310 Unspecified lump in the right breast, unspecified quadrant: Secondary | ICD-10-CM

## 2014-06-09 ENCOUNTER — Other Ambulatory Visit: Payer: Self-pay | Admitting: Internal Medicine

## 2014-06-09 ENCOUNTER — Ambulatory Visit
Admission: RE | Admit: 2014-06-09 | Discharge: 2014-06-09 | Disposition: A | Discharge status: Home / Self Care | Payer: Medicaid Other | Source: Ambulatory Visit | Attending: Internal Medicine | Admitting: Internal Medicine

## 2014-06-09 DIAGNOSIS — N6310 Unspecified lump in the right breast, unspecified quadrant: Secondary | ICD-10-CM

## 2014-07-12 ENCOUNTER — Ambulatory Visit (INDEPENDENT_AMBULATORY_CARE_PROVIDER_SITE_OTHER): Payer: Medicaid Other | Admitting: Infectious Disease

## 2014-07-12 VITALS — BP 111/69 | HR 57 | Temp 97.5°F

## 2014-07-12 DIAGNOSIS — N186 End stage renal disease: Secondary | ICD-10-CM

## 2014-07-12 DIAGNOSIS — Z992 Dependence on renal dialysis: Secondary | ICD-10-CM

## 2014-07-12 DIAGNOSIS — B20 Human immunodeficiency virus [HIV] disease: Secondary | ICD-10-CM | POA: Diagnosis not present

## 2014-07-12 DIAGNOSIS — F0280 Dementia in other diseases classified elsewhere without behavioral disturbance: Secondary | ICD-10-CM

## 2014-07-12 NOTE — Progress Notes (Signed)
   Subjective:    Patient ID: [name], female    DOB: Jun 05, 1955, 59 y.o.   MRN: [id number]  HPI    59 year old lady with HIV and HIV related dementia end-stage renal disease on hemodialysis who resides in skilled nursing facility. She returns to our clinic today for follow-up having not had labs done since April. She says that she is having a fistula revision but she is very difficult to understand and seems she does not understand much what I'm saying either.  Review of Systems  Constitutional: Negative for fever, chills, diaphoresis, fatigue and unexpected weight change.  HENT: Negative for congestion, rhinorrhea, sinus pressure, sneezing, sore throat and trouble swallowing.   Eyes: Negative for photophobia and visual disturbance.  Respiratory: Negative for cough, chest tightness, shortness of breath and stridor.   Gastrointestinal: Negative for nausea, vomiting, diarrhea, constipation, blood in stool and abdominal distention.  Genitourinary: Negative for dysuria, hematuria, flank pain and difficulty urinating.  Musculoskeletal: Negative for back pain, joint swelling and gait problem.  Skin: Negative for color change, pallor, rash and wound.  Neurological: Negative for dizziness, tremors, weakness and light-headedness.  Hematological: Negative for adenopathy. Does not bruise/bleed easily.  Psychiatric/Behavioral: Positive for confusion. Negative for behavioral problems, sleep disturbance, decreased concentration and agitation.       Objective:   Physical Exam  Constitutional: She appears well-developed and well-nourished. No distress.  HENT:  Head: Normocephalic and atraumatic.  Mouth/Throat: No oropharyngeal exudate.  Eyes: Conjunctivae and EOM are normal. No scleral icterus.  Neck: Normal range of motion. Neck supple.  Cardiovascular: Normal rate, regular rhythm and normal heart sounds.  Exam reveals no gallop and no friction rub.   No murmur heard. Pulmonary/Chest:  Effort normal and breath sounds normal. No respiratory distress. She has no wheezes. She has no rales. She exhibits no tenderness.  Abdominal: Soft. Bowel sounds are normal. She exhibits no distension.  Musculoskeletal: She exhibits no edema or tenderness.  Neurological: She is alert. She exhibits normal muscle tone. Coordination normal.  Skin: Skin is warm and dry. No rash noted. She is not diaphoretic. No erythema. No pallor.  Psychiatric: She has a normal mood and affect. Cognition and memory are impaired.          Assessment & Plan:   HIV: Continue weekly TNF, daily 3tc to sensitize her virus to TNF, continue BID isentress and daily boosted Prezista would consider changed to [place], daily TIVICAY along with daily Epivir and weekly tenofovir. But for now will keep her regimen the way it is    ESRD on HD: followup with [facility]   HIV neurocogntive disorder, +/- multifactorial dementia: rx HIV, supportive care otherwise per PCP

## 2014-07-13 LAB — HIV-1 RNA QUANT-NO REFLEX-BLD
HIV 1 RNA Quant: 27 copies/mL — ABNORMAL HIGH (ref <20)
HIV-1 RNA Quant, Log: 1.43 {Log} — ABNORMAL HIGH (ref <1.30)

## 2014-07-14 LAB — T-HELPER CELL (CD4) - (RCID CLINIC ONLY)
CD4 % Helper T Cell: 19 % — ABNORMAL LOW (ref 33–55)
CD4 T Cell Abs: 300 /uL — ABNORMAL LOW (ref 400–2700)

## 2014-09-20 ENCOUNTER — Ambulatory Visit: Payer: Medicaid Other

## 2014-09-27 ENCOUNTER — Ambulatory Visit (INDEPENDENT_AMBULATORY_CARE_PROVIDER_SITE_OTHER): Payer: Medicaid Other | Admitting: *Deleted

## 2014-09-27 DIAGNOSIS — Z23 Encounter for immunization: Secondary | ICD-10-CM

## 2014-11-13 ENCOUNTER — Ambulatory Visit: Payer: Medicaid Other | Admitting: Infectious Disease

## 2015-04-25 ENCOUNTER — Other Ambulatory Visit: Payer: Self-pay

## 2015-04-25 ENCOUNTER — Other Ambulatory Visit: Payer: Self-pay | Admitting: Internal Medicine

## 2015-04-25 DIAGNOSIS — Z1231 Encounter for screening mammogram for malignant neoplasm of breast: Secondary | ICD-10-CM

## 2015-06-11 ENCOUNTER — Ambulatory Visit
Admission: RE | Admit: 2015-06-11 | Discharge: 2015-06-11 | Disposition: A | Discharge status: Home / Self Care | Payer: Medicaid Other | Source: Ambulatory Visit

## 2015-06-11 DIAGNOSIS — Z1231 Encounter for screening mammogram for malignant neoplasm of breast: Secondary | ICD-10-CM

## 2015-06-24 ENCOUNTER — Encounter (HOSPITAL_COMMUNITY): Payer: Self-pay | Admitting: Emergency Medicine

## 2015-06-24 ENCOUNTER — Emergency Department (HOSPITAL_COMMUNITY)
Admission: EM | Admit: 2015-06-24 | Discharge: 2015-06-24 | Disposition: A | Discharge status: Home / Self Care | Payer: Medicaid Other | Attending: Emergency Medicine | Admitting: Emergency Medicine

## 2015-06-24 DIAGNOSIS — H6123 Impacted cerumen, bilateral: Secondary | ICD-10-CM | POA: Diagnosis not present

## 2015-06-24 DIAGNOSIS — B20 Human immunodeficiency virus [HIV] disease: Secondary | ICD-10-CM | POA: Insufficient documentation

## 2015-06-24 DIAGNOSIS — I5020 Unspecified systolic (congestive) heart failure: Secondary | ICD-10-CM | POA: Insufficient documentation

## 2015-06-24 DIAGNOSIS — I429 Cardiomyopathy, unspecified: Secondary | ICD-10-CM | POA: Insufficient documentation

## 2015-06-24 DIAGNOSIS — N186 End stage renal disease: Secondary | ICD-10-CM | POA: Diagnosis not present

## 2015-06-24 DIAGNOSIS — Z79899 Other long term (current) drug therapy: Secondary | ICD-10-CM | POA: Insufficient documentation

## 2015-06-24 DIAGNOSIS — Z7982 Long term (current) use of aspirin: Secondary | ICD-10-CM | POA: Insufficient documentation

## 2015-06-24 DIAGNOSIS — I132 Hypertensive heart and chronic kidney disease with heart failure and with stage 5 chronic kidney disease, or end stage renal disease: Secondary | ICD-10-CM | POA: Insufficient documentation

## 2015-06-24 DIAGNOSIS — H9202 Otalgia, left ear: Secondary | ICD-10-CM | POA: Diagnosis present

## 2015-06-24 NOTE — ED Provider Notes (Signed)
CSN: [id number]     Arrival date & time 06/24/15  2004 History  By signing my name below, I, Reola Mosher, attest that this documentation has been prepared under the direction and in the presence of Ariadna Setter, PA-C.   Electronically Signed: Reola Mosher, ED Scribe. 06/24/2015. 9:02 PM.   Chief Complaint  Patient presents with  . Otalgia   Patient is a 60 y.o. female presenting with ear pain. The history is provided by the patient. No language interpreter was used.  Otalgia Location:  Left Behind ear:  No abnormality Quality:  Throbbing Severity:  Mild Duration:  1 day Timing:  Constant Progression:  Unchanged Chronicity:  New Context: not foreign body in ear, not loud noise and no water in ear   Relieved by:  None tried Ineffective treatments:  None tried Associated symptoms: no congestion, no cough, no ear discharge, no fever, no headaches, no hearing loss, no rhinorrhea and no sore throat    HPI Comments: [name] is a 60 y.o. female with a PMHx significant for HIV, HTN, anemia, and ESRD who presents to the Emergency Department complaining of gradual onset L-sided otalgia that began yesterday. Pt describes her pain as "throbbing like a toothache". She denies using q-tips or any other foreign bodies inserted into the ear. No recent exposure to water or loud noises. No OTC medications or home remedies tried PTA. She denies any ear discharge, hearing problems, nasal congestion, rhinorrhea, fevers or other URI symptoms.    Past Medical History  Diagnosis Date  . Human immunodeficiency virus (HIV) disease ([place])   . Hypertension   . Clostridium difficile infection 04/04/10    07/2011 and 08/2011  . Bacteriuria, asymptomatic 04/04/10    Culture grew VRE sensitive to linesolid   . MGUS (monoclonal gammopathy of unknown significance)   . History of bacteremia     MSSA; 05/2011, 08/2011  . Anemia   . Systolic heart failure ([place])     08/27/11 EF 35-40%  . Cancer ([place])    . ESRD (end stage renal disease) ([place]) 03/19/2007    Started HD in 2010.  ESRD was due to HIV per pt. Gets HD on [employer] at [facility] on TTS schedule.     . Peripheral vascular disease ([place])   . Endocarditis     MSSA  . 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 Clostridium difficile colitis   . Septicemia due to enterococcus [facility])    Past Surgical History  Procedure Laterality Date  . Av fistula placement    . Insertion of dialysis catheter  12/30/2010    Procedure: INSERTION OF DIALYSIS CATHETER;  Surgeon: Elam Dutch, MD;  Location: [place];  Service: Vascular;  Laterality: Left;  Insertion of left internal jugular dialysis catheter  . Vascular surgery    . Insertion of dialysis catheter  05/26/2011    Procedure: INSERTION OF DIALYSIS CATHETER;  Surgeon: Conrad Minden, MD;  Location: [place];  Service: Vascular;  Laterality: N/A;  Insertion tunneled dialysis catheter in Right Internal Jugular with 23 cm catheter   . Insertion of dialysis catheter  08/26/2011    Procedure: INSERTION OF DIALYSIS CATHETER;  Surgeon: Elam Dutch, MD;  Location: [facility] OR;  Service: Vascular;  Laterality: Right;  insertion of Right Femoral vein Dialysis catheter  . Tee without cardioversion  08/27/2011    Procedure: TRANSESOPHAGEAL ECHOCARDIOGRAM (TEE);  Surgeon: Nadean Corwin.  Hilty, MD;  Location: [place];  Service: Cardiovascular;  Laterality: N/A;  . Av fistula placement  09/01/2011    Procedure: INSERTION OF ARTERIOVENOUS (AV) GORE-TEX GRAFT ARM;  Surgeon: Rosetta Posner, MD;  Location: [facility] OR;  Service: Vascular;  Laterality: Right;  Using 104mm x 50  stretch goretex graft  . Thrombectomy and revision of arterioventous (av) goretex  graft Right 02/13/2012    Procedure: THROMBECTOMY AND REVISION OF ARTERIOVENTOUS (AV) GORETEX  GRAFT;  Surgeon: Angelia Mould, MD;  Location: [place];  Service: Vascular;  Laterality: Right;  . Tee without cardioversion N/A  02/16/2012    Procedure: TRANSESOPHAGEAL ECHOCARDIOGRAM (TEE);  Surgeon: Fay Records, MD;  Location: [place];  Service: Cardiovascular;  Laterality: N/A;  Rm [id number]  . Avgg removal Right 03/29/2012    Procedure: REMOVAL OF ARTERIOVENOUS GORETEX GRAFT ([place]);  Surgeon: Rosetta Posner, MD;  Location: [place];  Service: Vascular;  Laterality: Right;  . Insertion of dialysis catheter N/A 03/29/2012    Procedure: INSERTION OF DIALYSIS CATHETER;  Surgeon: Rosetta Posner, MD;  Location: [place];  Service: Vascular;  Laterality: N/A;  Ultrasound guided  . Av fistula placement Left 05/03/2012    Procedure: INSERTION OF ARTERIOVENOUS (AV) GORE-TEX GRAFT THIGH USING 64mm X 70cm Gore-Tex Graft ;  Surgeon: Rosetta Posner, MD;  Location: [facility] OR;  Service: Vascular;  Laterality: Left;  . Transthoracic echocardiogram  08/27/2011    EF 35-40%, mild-moderate concetric hypertrophy of the LV, moderately reduced LV systolic function,  . Abdominal aortagram N/A 12/23/2010    Procedure: ABDOMINAL Maxcine Ham;  Surgeon: Angelia Mould, MD;  Location: [facility] CATH LAB;  Service: Cardiovascular;  Laterality: N/A;  . Lower extremity angiogram  12/23/2010    Procedure: LOWER EXTREMITY ANGIOGRAM;  Surgeon: Angelia Mould, MD;  Location: [facility] CATH LAB;  Service: Cardiovascular;;   Family History  Problem Relation Age of Onset  . Alcohol abuse      family h/o addiction/alcoholism  . Diabetes Cousin     first degree relatives  . Kidney disease Mother   . Kidney disease Maternal Uncle   . Cancer Sister   . Heart disease Father    Social History  Substance Use Topics  . Smoking status: Never Smoker   . Smokeless tobacco: Never Used  . Alcohol Use: No   OB History    No data available     Review of Systems  Constitutional: Negative for fever.  HENT: Positive for ear pain. Negative for congestion, ear discharge, hearing loss, rhinorrhea and sore throat.   Respiratory: Negative for cough.   Neurological: Negative for  headaches.  All other systems reviewed and are negative.  Allergies  Penicillins  Home Medications   Prior to Admission medications   Medication Sig Start Date End Date Taking? Authorizing Provider  acetaminophen (TYLENOL) 325 MG tablet Take 650 mg by mouth every 4 (four) hours as needed for mild pain.    Historical Provider, MD  aspirin 81 MG chewable tablet Chew 81 mg by mouth daily at 12 noon.     Historical Provider, MD  cinacalcet (SENSIPAR) 30 MG tablet Take 30 mg by mouth every morning.    Historical Provider, MD  Darunavir Ethanolate (PREZISTA) 800 MG tablet Take 1 tablet (800 mg total) by mouth daily with breakfast. 09/02/11   Nino Glow McLean-Scocozza, MD  HYDROcodone-acetaminophen (NORCO/VICODIN) 5-325 MG per tablet Take one tablet by mouth every 4 hours as needed for pain 03/27/14   Sendil  Wynelle Link, MD  lamiVUDine (EPIVIR) 10 MG/ML solution Take 50 mg by mouth daily at 12 noon.    Historical Provider, MD  midodrine (PROAMATINE) 10 MG tablet Take 10 mg by mouth 2 (two) times daily. At 630am and 10pm    Historical Provider, MD  Multiple Vitamins-Minerals (CERTAGEN PO) Take 1 tablet by mouth daily at 12 noon.    Historical Provider, MD  raltegravir (ISENTRESS) 400 MG tablet Take 1 tablet (400 mg total) by mouth 2 (two) times daily. 04/15/13   Truman Hayward, MD  ritonavir (NORVIR) 100 MG TABS Take 1 tablet (100 mg total) by mouth daily with breakfast. 09/02/11   Nino Glow McLean-Scocozza, MD  sevelamer carbonate (RENVELA) 800 MG tablet Take 1,600 mg by mouth 3 (three) times daily with meals.    Historical Provider, MD  tenofovir (VIREAD) 300 MG tablet Take 300 mg by mouth once a week. On Fridays    Historical Provider, MD   BP 103/59 mmHg  Pulse 88  Temp(Src) 99.2 F (37.3 C) (Oral)  Resp 20  SpO2 94%   Physical Exam  Constitutional: She appears well-developed and well-nourished. No distress.  HENT:  Head: Normocephalic and atraumatic.  Right Ear: External ear and ear canal  normal.  Left Ear: External ear and ear canal normal.  Cerumen impaction bilaterally with no visualization of the TMs. Canals are without erythema or swelling. No mastoid or tragus tenderness to palpation. No tenderness of the ear on manipulation of the external ear.  Eyes: Conjunctivae are normal. Right eye exhibits no discharge. Left eye exhibits no discharge. No scleral icterus.  Neck: Normal range of motion.  Cardiovascular: Normal rate.   Pulmonary/Chest: Effort normal.  Musculoskeletal: Normal range of motion.  Moves all extremities spontaneously  Neurological: She is alert. Coordination normal.  Skin: Skin is warm and dry.  Psychiatric: She has a normal mood and affect. Her behavior is normal.  Nursing note and vitals reviewed.   ED Course  Procedures (including critical care time)  DIAGNOSTIC STUDIES: Oxygen Saturation is 94% on RA, adequate by my interpretation.   COORDINATION OF CARE: 9:02 PM-Discussed next steps with pt including cerumen extraction. Pt verbalized understanding and is agreeable with the plan.   Labs Review Labs Reviewed - No data to display  Imaging Review No results found.  I have personally reviewed and evaluated these images and lab results as part of my medical decision-making.  MDM   Final diagnoses:  Cerumen impaction, bilateral   60 year old female presenting with left ear pain 1 day. Afebrile and nontoxic appearing. Bilateral cerumen impactions noted. Cerumen removed bilaterally with curette with large amount of wax removed. TMs are visualized post-removal and are without erythema or effusion. Pt reports improved ear pain after removal. Discussed proper cleaning techniques including ear wax softening drops and follow up with PCP as needed. Patient is stable for discharge.  I personally performed the services described in this documentation, which was scribed in my presence. The recorded information has been reviewed and is  accurate.     Lahoma Crocker Ilanna Deihl, PA-C 06/24/15 2202  Davonna Belling, MD 06/24/15 [phone]

## 2015-06-24 NOTE — Discharge Instructions (Signed)
Cerumen Impaction The structures of the external ear canal secrete a waxy substance known as cerumen. Excess cerumen can build up in the ear canal, causing a condition known as cerumen impaction. Cerumen impaction can cause ear pain and disrupt the function of the ear. The rate of cerumen production differs for each individual. In certain individuals, the configuration of the ear canal may decrease his or her ability to naturally remove cerumen. CAUSES Cerumen impaction is caused by excessive cerumen production or buildup. RISK FACTORS  Frequent use of swabs to clean ears.  Having narrow ear canals.  Having eczema.  Being dehydrated. SIGNS AND SYMPTOMS  Diminished hearing.  Ear drainage.  Ear pain.  Ear itch. TREATMENT Treatment may involve:  Over-the-counter or prescription ear drops to soften the cerumen.  Removal of cerumen by a health care provider. This may be done with:  Irrigation with warm water. This is the most common method of removal.  Ear curettes and other instruments.  Surgery. This may be done in severe cases. HOME CARE INSTRUCTIONS  Take medicines only as directed by your health care provider.  Do not insert objects into the ear with the intent of cleaning the ear. PREVENTION  Do not insert objects into the ear, even with the intent of cleaning the ear. Removing cerumen as a part of normal hygiene is not necessary, and the use of swabs in the ear canal is not recommended.  Drink enough water to keep your urine clear or pale yellow.  Control your eczema if you have it. SEEK MEDICAL CARE IF:  You develop ear pain.  You develop bleeding from the ear.  The cerumen does not clear after you use ear drops as directed.   This information is not intended to replace advice given to you by your health care provider. Make sure you discuss any questions you have with your health care provider.   Document Released: 01/31/2004 Document Revised: 01/13/2014  Document Reviewed: 08/09/2014 Elsevier Interactive Patient Education 2016 [employer]  

## 2015-06-24 NOTE — ED Notes (Signed)
Pt c/o L ear pain onset yesterday, denies drainage, denies fever at home.

## 2015-06-30 ENCOUNTER — Emergency Department (HOSPITAL_COMMUNITY)
Admission: EM | Admit: 2015-06-30 | Discharge: 2015-06-30 | Disposition: A | Discharge status: Home / Self Care | Payer: Medicaid Other | Attending: Emergency Medicine | Admitting: Emergency Medicine

## 2015-06-30 ENCOUNTER — Encounter (HOSPITAL_COMMUNITY): Payer: Self-pay | Admitting: *Deleted

## 2015-06-30 DIAGNOSIS — H6092 Unspecified otitis externa, left ear: Secondary | ICD-10-CM | POA: Insufficient documentation

## 2015-06-30 DIAGNOSIS — N186 End stage renal disease: Secondary | ICD-10-CM | POA: Diagnosis not present

## 2015-06-30 DIAGNOSIS — Z79899 Other long term (current) drug therapy: Secondary | ICD-10-CM | POA: Diagnosis not present

## 2015-06-30 DIAGNOSIS — H9202 Otalgia, left ear: Secondary | ICD-10-CM | POA: Diagnosis present

## 2015-06-30 DIAGNOSIS — I739 Peripheral vascular disease, unspecified: Secondary | ICD-10-CM | POA: Insufficient documentation

## 2015-06-30 DIAGNOSIS — Z992 Dependence on renal dialysis: Secondary | ICD-10-CM | POA: Diagnosis not present

## 2015-06-30 DIAGNOSIS — Z95818 Presence of other cardiac implants and grafts: Secondary | ICD-10-CM | POA: Insufficient documentation

## 2015-06-30 DIAGNOSIS — Z21 Asymptomatic human immunodeficiency virus [HIV] infection status: Secondary | ICD-10-CM | POA: Diagnosis not present

## 2015-06-30 DIAGNOSIS — F1490 Cocaine use, unspecified, uncomplicated: Secondary | ICD-10-CM | POA: Insufficient documentation

## 2015-06-30 DIAGNOSIS — Z859 Personal history of malignant neoplasm, unspecified: Secondary | ICD-10-CM | POA: Insufficient documentation

## 2015-06-30 DIAGNOSIS — I132 Hypertensive heart and chronic kidney disease with heart failure and with stage 5 chronic kidney disease, or end stage renal disease: Secondary | ICD-10-CM | POA: Diagnosis not present

## 2015-06-30 DIAGNOSIS — Z7982 Long term (current) use of aspirin: Secondary | ICD-10-CM | POA: Diagnosis not present

## 2015-06-30 DIAGNOSIS — I5020 Unspecified systolic (congestive) heart failure: Secondary | ICD-10-CM | POA: Insufficient documentation

## 2015-06-30 MED ORDER — CIPROFLOXACIN-DEXAMETHASONE 0.3-0.1 % OT SUSP
4.0000 [drp] | Freq: Two times a day (BID) | OTIC | Status: DC
  Filled 2015-06-30: qty 7.5

## 2015-06-30 MED ORDER — CIPROFLOXACIN HCL 500 MG PO TABS: 500.0000 mg | ORAL_TABLET | Freq: Every day | ORAL | Status: DC

## 2015-06-30 NOTE — ED Notes (Signed)
[place] called again, state transportation is on the way.

## 2015-06-30 NOTE — Discharge Instructions (Signed)
Please use Ciprodex eardrops twice daily for 7 days. Please take Cipro once daily orally. Please call and make an appointment for follow-up with ENT Dr. Simeon Craft.   Otitis Externa Otitis externa is a bacterial or fungal infection of the outer ear canal. This is the area from the eardrum to the outside of the ear. Otitis externa is sometimes called "swimmer's ear." CAUSES  Possible causes of infection include:  Swimming in dirty water.  Moisture remaining in the ear after swimming or bathing.  Mild injury (trauma) to the ear.  Objects stuck in the ear (foreign body).  Cuts or scrapes (abrasions) on the outside of the ear. SIGNS AND SYMPTOMS  The first symptom of infection is often itching in the ear canal. Later signs and symptoms may include swelling and redness of the ear canal, ear pain, and yellowish-white fluid (pus) coming from the ear. The ear pain may be worse when pulling on the earlobe. DIAGNOSIS  Your health care provider will perform a physical exam. A sample of fluid may be taken from the ear and examined for bacteria or fungi. TREATMENT  Antibiotic ear drops are often given for 10 to 14 days. Treatment may also include pain medicine or corticosteroids to reduce itching and swelling. HOME CARE INSTRUCTIONS   Apply antibiotic ear drops to the ear canal as prescribed by your health care provider.  Take medicines only as directed by your health care provider.  If you have diabetes, follow any additional treatment instructions from your health care provider.  Keep all follow-up visits as directed by your health care provider. PREVENTION   Keep your ear dry. Use the corner of a towel to absorb water out of the ear canal after swimming or bathing.  Avoid scratching or putting objects inside your ear. This can damage the ear canal or remove the protective wax that lines the canal. This makes it easier for bacteria and fungi to grow.  Avoid swimming in lakes, polluted water, or  poorly chlorinated pools.  You may use ear drops made of rubbing alcohol and vinegar after swimming. Combine equal parts of white vinegar and alcohol in a bottle. Put 3 or 4 drops into each ear after swimming. SEEK MEDICAL CARE IF:   You have a fever.  Your ear is still red, swollen, painful, or draining pus after 3 days.  Your redness, swelling, or pain gets worse.  You have a severe headache.  You have redness, swelling, pain, or tenderness in the area behind your ear. MAKE SURE YOU:   Understand these instructions.  Will watch your condition.  Will get help right away if you are not doing well or get worse.   This information is not intended to replace advice given to you by your health care provider. Make sure you discuss any questions you have with your health care provider.   Document Released: 12/23/2004 Document Revised: 01/13/2014 Document Reviewed: 01/09/2011 Elsevier Interactive Patient Education [employer].

## 2015-06-30 NOTE — ED Notes (Signed)
[facility] called for transport.

## 2015-06-30 NOTE — ED Provider Notes (Signed)
CSN: [id number]     Arrival date & time 06/30/15  1117 History   First MD Initiated Contact with Patient 06/30/15 1136     Chief Complaint  Patient presents with  . Otalgia   Shamonique A Mccosh is a 60 y.o. female who is HIV positive and on ESRD who presents to the ED following her dialysis complaining of left ear pain and discharge for the past week. She reports she was seen in the ED 1 week ago for the same complaint And was diagnosed with a cerumen impaction. She had her cerumen reviewed. She reports since she's had continued pain and discharge from her left ear. She reports her hearing is intact. She denies any pain to her external portion of her ear. She reports her caretakers at [facility] been placing eardrops in her left ear. She's been compliant with her HIV medications. She denies any fevers, hearing loss, neck pain, sore throat.    Patient is a 60 y.o. female presenting with ear pain. The history is provided by the patient and medical records. No language interpreter was used.  Otalgia Associated symptoms: ear discharge   Associated symptoms: no cough, no fever, no neck pain, no rash, no sore throat and no tinnitus     Past Medical History  Diagnosis Date  . Human immunodeficiency virus (HIV) disease ([place])   . Hypertension   . Clostridium difficile infection 04/04/10    07/2011 and 08/2011  . Bacteriuria, asymptomatic 04/04/10    Culture grew VRE sensitive to linesolid   . MGUS (monoclonal gammopathy of unknown significance)   . History of bacteremia     MSSA; 05/2011, 08/2011  . Anemia   . Systolic heart failure ([place])     08/27/11 EF 35-40%  . Cancer ([place])   . ESRD (end stage renal disease) ([place]) 03/19/2007    Started HD in 2010.  ESRD was due to HIV per pt. Gets HD on [employer] at [facility] on TTS schedule.     . Peripheral vascular disease ([place])   . Endocarditis     MSSA  . 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 Clostridium  difficile colitis   . Septicemia due to enterococcus [facility])    Past Surgical History  Procedure Laterality Date  . Av fistula placement    . Insertion of dialysis catheter  12/30/2010    Procedure: INSERTION OF DIALYSIS CATHETER;  Surgeon: Elam Dutch, MD;  Location: [place];  Service: Vascular;  Laterality: Left;  Insertion of left internal jugular dialysis catheter  . Vascular surgery    . Insertion of dialysis catheter  05/26/2011    Procedure: INSERTION OF DIALYSIS CATHETER;  Surgeon: Conrad Harrietta, MD;  Location: [place];  Service: Vascular;  Laterality: N/A;  Insertion tunneled dialysis catheter in Right Internal Jugular with 23 cm catheter   . Insertion of dialysis catheter  08/26/2011    Procedure: INSERTION OF DIALYSIS CATHETER;  Surgeon: Elam Dutch, MD;  Location: [facility] OR;  Service: Vascular;  Laterality: Right;  insertion of Right Femoral vein Dialysis catheter  . Tee without cardioversion  08/27/2011    Procedure: TRANSESOPHAGEAL ECHOCARDIOGRAM (TEE);  Surgeon: Pixie Casino, MD;  Location: [place];  Service: Cardiovascular;  Laterality: N/A;  . Av fistula placement  09/01/2011    Procedure: INSERTION OF ARTERIOVENOUS (AV) GORE-TEX GRAFT ARM;  Surgeon: Rosetta Posner, MD;  Location: [place];  Service:  Vascular;  Laterality: Right;  Using 8mm x 50  stretch goretex graft  . Thrombectomy and revision of arterioventous (av) goretex  graft Right 02/13/2012    Procedure: THROMBECTOMY AND REVISION OF ARTERIOVENTOUS (AV) GORETEX  GRAFT;  Surgeon: Angelia Mould, MD;  Location: [place];  Service: Vascular;  Laterality: Right;  . Tee without cardioversion N/A 02/16/2012    Procedure: TRANSESOPHAGEAL ECHOCARDIOGRAM (TEE);  Surgeon: Fay Records, MD;  Location: [place];  Service: Cardiovascular;  Laterality: N/A;  Rm [id number]  . Avgg removal Right 03/29/2012    Procedure: REMOVAL OF ARTERIOVENOUS GORETEX GRAFT ([place]);  Surgeon: Rosetta Posner, MD;  Location: [place];  Service: Vascular;   Laterality: Right;  . Insertion of dialysis catheter N/A 03/29/2012    Procedure: INSERTION OF DIALYSIS CATHETER;  Surgeon: Rosetta Posner, MD;  Location: [place];  Service: Vascular;  Laterality: N/A;  Ultrasound guided  . Av fistula placement Left 05/03/2012    Procedure: INSERTION OF ARTERIOVENOUS (AV) GORE-TEX GRAFT THIGH USING 49mm X 70cm Gore-Tex Graft ;  Surgeon: Rosetta Posner, MD;  Location: [facility] OR;  Service: Vascular;  Laterality: Left;  . Transthoracic echocardiogram  08/27/2011    EF 35-40%, mild-moderate concetric hypertrophy of the LV, moderately reduced LV systolic function,  . Abdominal aortagram N/A 12/23/2010    Procedure: ABDOMINAL Maxcine Ham;  Surgeon: Angelia Mould, MD;  Location: [facility] CATH LAB;  Service: Cardiovascular;  Laterality: N/A;  . Lower extremity angiogram  12/23/2010    Procedure: LOWER EXTREMITY ANGIOGRAM;  Surgeon: Angelia Mould, MD;  Location: [facility] CATH LAB;  Service: Cardiovascular;;   Family History  Problem Relation Age of Onset  . Alcohol abuse      family h/o addiction/alcoholism  . Diabetes Cousin     first degree relatives  . Kidney disease Mother   . Kidney disease Maternal Uncle   . Cancer Sister   . Heart disease Father    Social History  Substance Use Topics  . Smoking status: Never Smoker   . Smokeless tobacco: Never Used  . Alcohol Use: No   OB History    No data available     Review of Systems  Constitutional: Negative for fever.  HENT: Positive for ear discharge and ear pain. Negative for mouth sores, sore throat, tinnitus and trouble swallowing.   Eyes: Negative for photophobia and visual disturbance.  Respiratory: Negative for cough.   Musculoskeletal: Negative for neck pain and neck stiffness.  Skin: Negative for rash.      Allergies  Penicillins  Home Medications   Prior to Admission medications   Medication Sig Start Date End Date Taking? Authorizing Provider  acetaminophen (TYLENOL) 325 MG tablet Take 650 mg  by mouth every 4 (four) hours as needed for mild pain.    Historical Provider, MD  aspirin 81 MG chewable tablet Chew 81 mg by mouth daily at 12 noon.     Historical Provider, MD  cinacalcet (SENSIPAR) 30 MG tablet Take 30 mg by mouth every morning.    Historical Provider, MD  ciprofloxacin (CIPRO) 500 MG tablet Take 1 tablet (500 mg total) by mouth daily. 06/30/15   Waynetta Pean, PA-C  Darunavir Ethanolate (PREZISTA) 800 MG tablet Take 1 tablet (800 mg total) by mouth daily with breakfast. 09/02/11   Nino Glow McLean-Scocozza, MD  HYDROcodone-acetaminophen (NORCO/VICODIN) 5-325 MG per tablet Take one tablet by mouth every 4 hours as needed for pain 03/27/14   Annita Brod, MD  lamiVUDine (EPIVIR)  10 MG/ML solution Take 50 mg by mouth daily at 12 noon.    Historical Provider, MD  midodrine (PROAMATINE) 10 MG tablet Take 10 mg by mouth 2 (two) times daily. At 630am and 10pm    Historical Provider, MD  Multiple Vitamins-Minerals (CERTAGEN PO) Take 1 tablet by mouth daily at 12 noon.    Historical Provider, MD  raltegravir (ISENTRESS) 400 MG tablet Take 1 tablet (400 mg total) by mouth 2 (two) times daily. 04/15/13   Truman Hayward, MD  ritonavir (NORVIR) 100 MG TABS Take 1 tablet (100 mg total) by mouth daily with breakfast. 09/02/11   Nino Glow McLean-Scocozza, MD  sevelamer carbonate (RENVELA) 800 MG tablet Take 1,600 mg by mouth 3 (three) times daily with meals.    Historical Provider, MD  tenofovir (VIREAD) 300 MG tablet Take 300 mg by mouth once a week. On Fridays    Historical Provider, MD   BP 96/89 mmHg  Pulse 81  Temp(Src) 98.3 F (36.8 C) (Oral)  Resp 14  SpO2 93% Physical Exam  Constitutional: She is oriented to person, place, and time. She appears well-developed and well-nourished. No distress.  Nontoxic appearing.  HENT:  Head: Normocephalic and atraumatic.  Right Ear: External ear normal.  Mouth/Throat: Oropharynx is clear and moist.  Left external ear has vesicles and  ulcerations to her auricle. These are non-tender to palpation. No mastoid tenderness. There is white watery discharge from her EAC with mild EAC edema. TM on left is unable to be visualized. Right ear is normal.   Eyes: Conjunctivae are normal. Pupils are equal, round, and reactive to light. Right eye exhibits no discharge. Left eye exhibits no discharge.  Neck: Normal range of motion. Neck supple. No tracheal deviation present.  Cardiovascular: Normal rate, regular rhythm and intact distal pulses.   Pulmonary/Chest: Effort normal. No stridor. No respiratory distress.  Lymphadenopathy:    She has no cervical adenopathy.  Neurological: She is alert and oriented to person, place, and time. Coordination normal.  Skin: Skin is warm and dry. No rash noted. She is not diaphoretic. No erythema. No pallor.  Psychiatric: She has a normal mood and affect. Her behavior is normal.  Nursing note and vitals reviewed.   ED Course  Procedures (including critical care time) Labs Review Labs Reviewed - No data to display  Imaging Review No results found.    EKG Interpretation None      Filed Vitals:   06/30/15 1129  BP: 96/89  Pulse: 81  Temp: 98.3 F (36.8 C)  TempSrc: Oral  Resp: 14  SpO2: 93%     MDM   Meds given in ED:  Medications  ciprofloxacin-dexamethasone (CIPRODEX) 0.3-0.1 % otic suspension 4 drop (not administered)    New Prescriptions   CIPROFLOXACIN (CIPRO) 500 MG TABLET    Take 1 tablet (500 mg total) by mouth daily.    Final diagnoses:  Otitis externa, left   This is a 60 y.o. female who is HIV positive and on ESRD who presents to the ED following her dialysis complaining of left ear pain and discharge for the past week. She reports she was seen in the ED 1 week ago for the same complaint And was diagnosed with a cerumen impaction. She had her cerumen reviewed. She reports since she's had continued pain and discharge from her left ear. She reports her hearing is  intact. She denies any pain to her external portion of her ear. She reports her caretakers at  [place] been placing eardrops in her left ear. She's been compliant with her HIV medications. She reports following up last month with ID, although, I do not have record of this. I encouraged her to follow up with ID.  On exam the patient is afebrile nontoxic appearing. To her left external ear she has circles and ulcerations as well as clear discharge from her external auditory canal. She has no tenderness to external ear. Vesicles are nontender. No mastoid tenderness. She is a mild external auditory canal edema with white curd-like discharge in her external auditory canal. I attempted to remove this cerumen and discharge with a lighted curette. I was still unable to visualize the TM after removing a large amount of cerumen. Exam is consistent with an otitis externa. The vesicles are nontender to palpation not painful and therefore doubt shingles or herpes. She denies vision changes or eye pain. Hearing is grossly intact. I placed and ear wick and provided her with Ciprodex eardrops. I advised she needs to use Arixtra drops in her left ear twice a day for 7 days. After a discussion and evaluation by my attending, Dr. Gilford Raid, will start the patient on cipro PO as well. We'll start Cipro 500 mg once a day as the patient is on hemodialysis. Will have the patient follow-up with ENT Dr. Simeon Craft. I discussed return precautions. I advised the patient to follow-up with their primary care provider this week. I advised the patient to return to the emergency department with new or worsening symptoms or new concerns. The patient verbalized understanding and agreement with plan.    This patient was discussed with and evaluated by Dr. Gilford Raid who agrees with assessment and plan.   Waynetta Pean, PA-C 06/30/15 [place], MD 06/30/15 1415

## 2015-06-30 NOTE — ED Notes (Addendum)
Pt complains of 6/10 pain and bloody/purulent drainage from her left ear. Pt was diagnosed with ear infection last week and has been taking ear drops, which have not seemed to help. Pt had dialysis today. Pt lives at [facility] assisted living.

## 2015-07-19 ENCOUNTER — Observation Stay (HOSPITAL_COMMUNITY)
Admission: EM | Admit: 2015-07-19 | Discharge: 2015-07-20 | Disposition: A | Discharge status: Home / Self Care | Payer: Medicaid Other | Attending: Internal Medicine | Admitting: Internal Medicine

## 2015-07-19 ENCOUNTER — Encounter (HOSPITAL_COMMUNITY): Payer: Self-pay | Admitting: Emergency Medicine

## 2015-07-19 DIAGNOSIS — Z7982 Long term (current) use of aspirin: Secondary | ICD-10-CM | POA: Diagnosis not present

## 2015-07-19 DIAGNOSIS — H6692 Otitis media, unspecified, left ear: Secondary | ICD-10-CM | POA: Diagnosis present

## 2015-07-19 DIAGNOSIS — B0221 Postherpetic geniculate ganglionitis: Secondary | ICD-10-CM | POA: Diagnosis not present

## 2015-07-19 DIAGNOSIS — E876 Hypokalemia: Secondary | ICD-10-CM | POA: Diagnosis present

## 2015-07-19 DIAGNOSIS — F0280 Dementia in other diseases classified elsewhere without behavioral disturbance: Secondary | ICD-10-CM

## 2015-07-19 DIAGNOSIS — Z21 Asymptomatic human immunodeficiency virus [HIV] infection status: Secondary | ICD-10-CM | POA: Diagnosis not present

## 2015-07-19 DIAGNOSIS — N186 End stage renal disease: Secondary | ICD-10-CM | POA: Diagnosis not present

## 2015-07-19 DIAGNOSIS — Z859 Personal history of malignant neoplasm, unspecified: Secondary | ICD-10-CM | POA: Diagnosis not present

## 2015-07-19 DIAGNOSIS — E871 Hypo-osmolality and hyponatremia: Secondary | ICD-10-CM | POA: Diagnosis present

## 2015-07-19 DIAGNOSIS — Z79899 Other long term (current) drug therapy: Secondary | ICD-10-CM | POA: Insufficient documentation

## 2015-07-19 DIAGNOSIS — I132 Hypertensive heart and chronic kidney disease with heart failure and with stage 5 chronic kidney disease, or end stage renal disease: Secondary | ICD-10-CM | POA: Insufficient documentation

## 2015-07-19 DIAGNOSIS — B20 Human immunodeficiency virus [HIV] disease: Secondary | ICD-10-CM

## 2015-07-19 DIAGNOSIS — Z992 Dependence on renal dialysis: Secondary | ICD-10-CM

## 2015-07-19 DIAGNOSIS — B028 Zoster with other complications: Secondary | ICD-10-CM | POA: Diagnosis not present

## 2015-07-19 DIAGNOSIS — I5020 Unspecified systolic (congestive) heart failure: Secondary | ICD-10-CM | POA: Insufficient documentation

## 2015-07-19 LAB — COMPREHENSIVE METABOLIC PANEL
ALT: 13 U/L — ABNORMAL LOW (ref 14–54)
AST: 26 U/L (ref 15–41)
Albumin: 3.1 g/dL — ABNORMAL LOW (ref 3.5–5.0)
Alkaline Phosphatase: 181 U/L — ABNORMAL HIGH (ref 38–126)
Anion gap: 10 (ref 5–15)
BUN: <5 mg/dL — ABNORMAL LOW (ref 6–20)
CO2: 30 mmol/L (ref 22–32)
Calcium: 7.7 mg/dL — ABNORMAL LOW (ref 8.9–10.3)
Chloride: 91 mmol/L — ABNORMAL LOW (ref 101–111)
Creatinine, Ser: 3.33 mg/dL — ABNORMAL HIGH (ref 0.44–1.00)
GFR calc Af Amer: 16 mL/min — ABNORMAL LOW (ref >60)
GFR calc non Af Amer: 14 mL/min — ABNORMAL LOW (ref >60)
Glucose, Bld: 88 mg/dL (ref 65–99)
Potassium: 3 mmol/L — ABNORMAL LOW (ref 3.5–5.1)
Sodium: 131 mmol/L — ABNORMAL LOW (ref 135–145)
Total Bilirubin: 0.5 mg/dL (ref 0.3–1.2)
Total Protein: 7.9 g/dL (ref 6.5–8.1)

## 2015-07-19 LAB — CBC
HCT: 30.5 % — ABNORMAL LOW (ref 36.0–46.0)
Hemoglobin: 9.5 g/dL — ABNORMAL LOW (ref 12.0–15.0)
MCH: 34.3 pg — ABNORMAL HIGH (ref 26.0–34.0)
MCHC: 31.1 g/dL (ref 30.0–36.0)
MCV: 110.1 fL — ABNORMAL HIGH (ref 78.0–100.0)
Platelets: 243 10*3/uL (ref 150–400)
RBC: 2.77 MIL/uL — ABNORMAL LOW (ref 3.87–5.11)
RDW: 12.6 % (ref 11.5–15.5)
WBC: 4.4 10*3/uL (ref 4.0–10.5)

## 2015-07-19 MED ORDER — VALACYCLOVIR HCL 500 MG PO TABS
1000.0000 mg | ORAL_TABLET | Freq: Three times a day (TID) | ORAL | Status: DC
  Administered 2015-07-20 (×2): 1000 mg via ORAL
  Filled 2015-07-19 (×3): qty 2

## 2015-07-19 MED ORDER — PREDNISONE 50 MG PO TABS
60.0000 mg | ORAL_TABLET | Freq: Every day | ORAL | Status: DC
  Administered 2015-07-19 – 2015-07-20 (×2): 60 mg via ORAL
  Filled 2015-07-19: qty 1
  Filled 2015-07-19: qty 3

## 2015-07-19 MED ORDER — VALACYCLOVIR HCL 500 MG PO TABS
1000.0000 mg | ORAL_TABLET | ORAL | Status: DC
  Administered 2015-07-19: 1000 mg via ORAL
  Filled 2015-07-19 (×2): qty 2

## 2015-07-19 NOTE — ED Notes (Signed)
Pt here from ENT office with infection to left ear lobe. Infection was cultured at ENT office and showed enterococcus faecalis which is susceptible to vancomycin. Per note from ENT pt also has pinpoint TM perforation to same ear.

## 2015-07-19 NOTE — H&P (Signed)
History and Physical    [name] [id number] DOB: Apr 28, 1955 DOA: 07/19/2015  PCP: No primary care provider on file. Consultants:  HIV Clinic; nephrology Patient coming from: [facility] rest home  Chief Complaint: ea\r pain  HPI: [name] is a 60 y.o. female with medical history significant of HIV with dementia and ESRD on Tu/Th/Sat HD.  She started with an ear infection on the left with pain 2 weeks ago.  +pruritis, tinnitus.  Has seen ENT twice.  Has been on antibiotics (oral and topical Cipro, PO Bactrim) with improvement only appreciated today.  Went to ENT today for follow up and and they sent her to ER.   Dark color drainage.  No fevers today, last about a week ago.     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     Past Medical History  Diagnosis Date  . Human immunodeficiency virus (HIV) disease ([place])   . Hypertension   . Clostridium difficile infection 04/04/10    07/2011 and 08/2011  . Bacteriuria, asymptomatic 04/04/10    Culture grew VRE sensitive to linesolid   . MGUS (monoclonal gammopathy of unknown significance)   . History of bacteremia     MSSA; 05/2011, 08/2011  . Anemia   . Systolic heart failure ([place])     08/27/11 EF 35-40%  . Cancer ([place])   . ESRD (end stage renal disease) ([place]) 03/19/2007    Started HD in 2010.  ESRD was due to HIV per pt. Gets HD on [address] [employer] [address] at [facility] on TTS schedule.     . Peripheral vascular disease ([place])   . Endocarditis     MSSA  . 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 Septicemia due to enterococcus [facility])     Past Surgical History  Procedure Laterality Date  . Av fistula placement    . Insertion of dialysis catheter  12/30/2010    Procedure: INSERTION OF DIALYSIS CATHETER;  Surgeon: Elam Dutch, MD;  Location:  [place];  Service: Vascular;  Laterality: Left;  Insertion of left internal jugular dialysis catheter  . Vascular surgery    . Insertion of dialysis catheter  05/26/2011    Procedure: INSERTION OF DIALYSIS CATHETER;  Surgeon: Conrad Agenda, MD;  Location: [place];  Service: Vascular;  Laterality: N/A;  Insertion tunneled dialysis catheter in Right Internal Jugular with 23 cm catheter   . Insertion of dialysis catheter  08/26/2011    Procedure: INSERTION OF DIALYSIS CATHETER;  Surgeon: Elam Dutch, MD;  Location: [facility] OR;  Service: Vascular;  Laterality: Right;  insertion of Right Femoral vein Dialysis catheter  . Tee without cardioversion  08/27/2011    Procedure: TRANSESOPHAGEAL ECHOCARDIOGRAM (TEE);  Surgeon: Pixie Casino, MD;  Location: [place];  Service: Cardiovascular;  Laterality: N/A;  . Av fistula placement  09/01/2011    Procedure: INSERTION OF ARTERIOVENOUS (AV) GORE-TEX GRAFT ARM;  Surgeon: Rosetta Posner, MD;  Location: [facility] OR;  Service: Vascular;  Laterality: Right;  Using 63mm x 50  stretch goretex graft  . Thrombectomy and revision of arterioventous (av) goretex  graft Right 02/13/2012    Procedure: THROMBECTOMY AND REVISION OF ARTERIOVENTOUS (AV) GORETEX  GRAFT;  Surgeon: Angelia Mould, MD;  Location: [place];  Service: Vascular;  Laterality: Right;  . Tee without cardioversion N/A 02/16/2012    Procedure: TRANSESOPHAGEAL ECHOCARDIOGRAM (TEE);  Surgeon: Fay Records, MD;  Location: [place];  Service: Cardiovascular;  Laterality: N/A;  Rm 6710  . Avgg removal Right 03/29/2012    Procedure: REMOVAL OF ARTERIOVENOUS GORETEX GRAFT ([place]);  Surgeon: Rosetta Posner, MD;  Location: [place];  Service: Vascular;  Laterality: Right;  . Insertion of dialysis catheter N/A 03/29/2012    Procedure: INSERTION OF DIALYSIS CATHETER;  Surgeon: Rosetta Posner, MD;  Location: [place];  Service: Vascular;  Laterality: N/A;  Ultrasound guided  . Av fistula placement Left 05/03/2012    Procedure: INSERTION  OF ARTERIOVENOUS (AV) GORE-TEX GRAFT THIGH USING 47mm X 70cm Gore-Tex Graft ;  Surgeon: Rosetta Posner, MD;  Location: [facility] OR;  Service: Vascular;  Laterality: Left;  . Transthoracic echocardiogram  08/27/2011    EF 35-40%, mild-moderate concetric hypertrophy of the LV, moderately reduced LV systolic function,  . Abdominal aortagram N/A 12/23/2010    Procedure: ABDOMINAL Maxcine Ham;  Surgeon: Angelia Mould, MD;  Location: [facility] CATH LAB;  Service: Cardiovascular;  Laterality: N/A;  . Lower extremity angiogram  12/23/2010    Procedure: LOWER EXTREMITY ANGIOGRAM;  Surgeon: Angelia Mould, MD;  Location[facility] CATH LAB;  Service: Cardiovascular;;    Social History   Social History  . Marital Status: Single    Spouse Name: N/A  . Number of Children: N/A  . Years of Education: N/A   Occupational History  . Not on file.   Social History Main Topics  . 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 Smokeless tobacco: Never Used     Comment: quit when she was diagnosed with HIV  . Alcohol Use: No  . Drug Use: Yes    Special: Cocaine     Comment: quit 32yrs ago  . Sexual Activity: No   Other Topics Concern  . Not on file   Social History Narrative   Lives with daughter(does not know dx). Oldest daughter does not dx.   Current partner of may years,? fidelity    Allergies  Allergen Reactions  . Penicillins Nausea And Vomiting and Rash    Family History  Problem Relation Age of Onset  . Alcohol abuse      family h/o addiction/alcoholism  . Diabetes Cousin     first degree relatives  . Kidney disease Mother   . Kidney disease Maternal Uncle   . Cancer Sister   . Heart disease Father     Prior to Admission medications   Medication Sig Start Date End Date Taking? Authorizing Provider  acetaminophen (TYLENOL) 325 MG tablet Take 650 mg by mouth every 4 (four) hours as needed for mild pain.   Yes Historical Provider, MD  aspirin 81 MG chewable tablet Chew 81 mg by mouth daily at 12 noon.     Yes Historical Provider, MD  cinacalcet (SENSIPAR) 30 MG tablet Take 30 mg by mouth every morning.   Yes Historical Provider, MD  ciprofloxacin (CIPRO) 500 MG tablet Take 1 tablet (500 mg total) by mouth daily. 06/30/15  Yes Waynetta Pean, PA-C  Darunavir Ethanolate (PREZISTA) 800 MG tablet Take 1 tablet (800 mg total) by mouth daily with breakfast. 09/02/11  Yes Nino Glow McLean-Scocozza, MD  HYDROcodone-acetaminophen (NORCO/VICODIN) 5-325 MG per tablet Take one tablet by mouth every 4 hours as needed for pain 03/27/14  Yes Annita Brod, MD  lamiVUDine (EPIVIR) 10 MG/ML solution Take 50 mg by mouth  daily at 12 noon.   Yes Historical Provider, MD  midodrine (PROAMATINE) 10 MG tablet Take 10 mg by mouth 2 (two) times daily. At 630am and 10pm   Yes Historical Provider, MD  Multiple Vitamins-Minerals (CERTAGEN PO) Take 1 tablet by mouth daily at 12 noon.   Yes Historical Provider, MD  raltegravir (ISENTRESS) 400 MG tablet Take 1 tablet (400 mg total) by mouth 2 (two) times daily. 04/15/13  Yes Truman Hayward, MD  ritonavir (NORVIR) 100 MG TABS Take 1 tablet (100 mg total) by mouth daily with breakfast. 09/02/11  Yes Nino Glow McLean-Scocozza, MD  sevelamer carbonate (RENVELA) 800 MG tablet Take 1,600 mg by mouth 3 (three) times daily with meals.   Yes Historical Provider, MD  tenofovir (VIREAD) 300 MG tablet Take 300 mg by mouth once a week. On Fridays   Yes Historical Provider, MD    Physical Exam: Filed Vitals:   07/19/15 2300 07/19/15 2315 07/19/15 2330 07/20/15 0045  BP: 118/78 103/68 104/73 143/79  Pulse: 81 80 81 87  Temp:    98.1 F (36.7 C)  TempSrc:    Oral  Resp:    19  Height:    5\' 4"  (1.626 m)  Weight:    98.4 kg (216 lb 14.9 oz)  SpO2: 95% 92% 91% 97%     General:  Appears very somnolent and is NAD Eyes:  PERRL, EOMI, normal lids, iris ENT:  grossly normal hearing, lips & tongue, mmm; fungating, purulent mass along the entire bottom of her earlobe with greenish  drainage Neck:  no LAD, masses or thyromegaly Cardiovascular:  RRR, no m/r/g. No LE edema.  Respiratory:  CTA bilaterally, no w/r/r. Normal respiratory effort. Abdomen:  soft, ntnd, NABS Skin:  no rash or induration seen on limited exam Musculoskeletal:  grossly normal tone BUE/BLE, good ROM, no bony abnormality Psychiatric:  grossly normal mood and affect, speech fluent and appropriate, AOx3 Neurologic:  CN 2-12 grossly intact other than possible mild L-sided facial droop, moves all extremities in coordinated fashion, sensation intact  Labs on Admission: I have personally reviewed following labs and imaging studies  CBC:  Recent Labs Lab 07/19/15 1853  WBC 4.4  HGB 9.5*  HCT 30.5*  MCV 110.1*  PLT 0000000   Basic Metabolic Panel:  Recent Labs Lab 07/19/15 1853  NA 131*  K 3.0*  CL 91*  CO2 30  GLUCOSE 88  BUN <5*  CREATININE 3.33*  CALCIUM 7.7*   GFR: Estimated Creatinine Clearance: 21 mL/min (by C-G formula based on Cr of 3.33). Liver Function Tests:  Recent Labs Lab 07/19/15 1853  AST 26  ALT 13*  ALKPHOS 181*  BILITOT 0.5  PROT 7.9  ALBUMIN 3.1*   No results for input(s): LIPASE, AMYLASE in the last 168 hours. No results for input(s): AMMONIA in the last 168 hours. Coagulation Profile: No results for input(s): INR, PROTIME in the last 168 hours. Cardiac Enzymes: No results for input(s): CKTOTAL, CKMB, CKMBINDEX, TROPONINI in the last 168 hours. BNP (last 3 results) No results for input(s): PROBNP in the last 8760 hours. HbA1C: No results for input(s): HGBA1C in the last 72 hours. CBG: No results for input(s): GLUCAP in the last 168 hours. Lipid Profile: No results for input(s): CHOL, HDL, LDLCALC, TRIG, CHOLHDL, LDLDIRECT in the last 72 hours. Thyroid Function Tests: No results for input(s): TSH, T4TOTAL, FREET4, T3FREE, THYROIDAB in the last 72 hours. Anemia Panel: No results for input(s): VITAMINB12, FOLATE, FERRITIN, TIBC, IRON, RETICCTPCT  in the  last 72 hours. Urine analysis:    Component Value Date/Time   COLORURINE YELLOW 11/30/2007 0730   APPEARANCEUR CLOUDY* 11/30/2007 0730   LABSPEC 1.024 11/30/2007 0730   PHURINE 5.5 11/30/2007 0730   GLUCOSEU NEGATIVE 11/30/2007 0730   HGBUR LARGE* 11/30/2007 0730   BILIRUBINUR SMALL* 11/30/2007 0730   KETONESUR 15* 11/30/2007 0730   PROTEINUR >300* 11/30/2007 0730   UROBILINOGEN 1.0 11/30/2007 0730   NITRITE NEGATIVE 11/30/2007 0730   LEUKOCYTESUR MODERATE* 11/30/2007 0730    Creatinine Clearance: Estimated Creatinine Clearance: 21 mL/min (by C-G formula based on Cr of 3.33).  Sepsis Labs: @LABRCNTIP (procalcitonin:4,lacticidven:4) )No results found for this or any previous visit (from the past 240 hour(s)).   Radiological Exams on Admission: No results found.  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[place])   Hyponatremia   Hypokalemia    Herpes zoster oticus/Ramsey Hunt Syndrome -Will admit to observation status as per ID recommendation -Pain control with Tylenol/morphine -PO Valtrex as per ID -PO prednisone as per ID -Contact and airborne precautions  HIV with dementia -On a multitude of medications to treat, will continue -Clearly a poor historian -Likely to need explicit instructions at the time of discharge  ESRD -On Tu/Th/Sa HD -If still present on Saturday, will need nephrology consult -Chronic meds continued -Nephrology prn order set ordered  Hyponatremia -Likely resulting from infection, increased fluid requirements -Will judiciously provide fluids  Hypokalemia -Will replete   DVT prophylaxis: [place] Heparin Code Status: Full - confirmed with patient Family Communication: None present Disposition Plan: Back to her "rest home" once clinically improved Consults called: ID (by ER) Admission  status: Observation   Karmen Bongo MD [facility]  If 7PM-7AM, please contact night-coverage [email or web address] Password [email or web address]  07/20/2015, 2:13 AM

## 2015-07-19 NOTE — ED Provider Notes (Signed)
CSN: [id number]     Arrival date & time 07/19/15  1432 History   First MD Initiated Contact with Patient 07/19/15 1633     Chief Complaint  Patient presents with  . Wound Infection  . Otitis Media     (Consider location/radiation/quality/duration/timing/severity/associated sxs/prior Treatment) HPI Comments: Patient is a 60 year old female with past medical history of HIV disease and end-stage renal disease on hemodialysis. She presents for evaluation of left ear pain and swelling that has been worsening over the past several weeks. She has been seen on several occasions for this, most recently by ENT. A culture was obtained which has grown Enterococcus faecalis which sensitivity shows is susceptible to vancomycin. She was referred here for infectious disease consultation and possibly IV antibiotics. The patient denies to me she is experiencing any headache or significant discomfort. She denies any fevers or chills.  The history is provided by the patient.    Past Medical History  Diagnosis Date  . Human immunodeficiency virus (HIV) disease ([place])   . Hypertension   . Clostridium difficile infection 04/04/10    07/2011 and 08/2011  . Bacteriuria, asymptomatic 04/04/10    Culture grew VRE sensitive to linesolid   . MGUS (monoclonal gammopathy of unknown significance)   . History of bacteremia     MSSA; 05/2011, 08/2011  . Anemia   . Systolic heart failure ([place])     08/27/11 EF 35-40%  . Cancer ([place])   . ESRD (end stage renal disease) ([place]) 03/19/2007    Started HD in 2010.  ESRD was due to HIV per pt. Gets HD on [address] [employer] [address] at [facility] on TTS schedule.     . Peripheral vascular disease ([place])   . Endocarditis     MSSA  . 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 Clostridium difficile colitis   . Septicemia due to enterococcus [facility])    Past Surgical History  Procedure Laterality Date  . Av fistula placement    . Insertion of dialysis catheter   12/30/2010    Procedure: INSERTION OF DIALYSIS CATHETER;  Surgeon: Elam Dutch, MD;  Location: [place];  Service: Vascular;  Laterality: Left;  Insertion of left internal jugular dialysis catheter  . Vascular surgery    . Insertion of dialysis catheter  05/26/2011    Procedure: INSERTION OF DIALYSIS CATHETER;  Surgeon: Conrad Harmon, MD;  Location: [place];  Service: Vascular;  Laterality: N/A;  Insertion tunneled dialysis catheter in Right Internal Jugular with 23 cm catheter   . Insertion of dialysis catheter  08/26/2011    Procedure: INSERTION OF DIALYSIS CATHETER;  Surgeon: Elam Dutch, MD;  Location: [facility] OR;  Service: Vascular;  Laterality: Right;  insertion of Right Femoral vein Dialysis catheter  . Tee without cardioversion  08/27/2011    Procedure: TRANSESOPHAGEAL ECHOCARDIOGRAM (TEE);  Surgeon: Pixie Casino, MD;  Location: [place];  Service: Cardiovascular;  Laterality: N/A;  . Av fistula placement  09/01/2011    Procedure: INSERTION OF ARTERIOVENOUS (AV) GORE-TEX GRAFT ARM;  Surgeon: Rosetta Posner, MD;  Location: [facility] OR;  Service: Vascular;  Laterality: Right;  Using 65mm x 50  stretch goretex graft  . Thrombectomy and revision of arterioventous (av) goretex  graft Right 02/13/2012    Procedure: THROMBECTOMY AND REVISION OF ARTERIOVENTOUS (AV) GORETEX  GRAFT;  Surgeon: Angelia Mould, MD;  Location: [place];  Service: Vascular;  Laterality: Right;  . [place] Dates  without cardioversion N/A 02/16/2012    Procedure: TRANSESOPHAGEAL ECHOCARDIOGRAM (TEE);  Surgeon: Fay Records, MD;  Location: [place];  Service: Cardiovascular;  Laterality: N/A;  Rm 6710  . Avgg removal Right 03/29/2012    Procedure: REMOVAL OF ARTERIOVENOUS GORETEX GRAFT ([place]);  Surgeon: Rosetta Posner, MD;  Location: [place];  Service: Vascular;  Laterality: Right;  . Insertion of dialysis catheter N/A 03/29/2012    Procedure: INSERTION OF DIALYSIS CATHETER;  Surgeon: Rosetta Posner, MD;  Location: [place];  Service: Vascular;   Laterality: N/A;  Ultrasound guided  . Av fistula placement Left 05/03/2012    Procedure: INSERTION OF ARTERIOVENOUS (AV) GORE-TEX GRAFT THIGH USING 17mm X 70cm Gore-Tex Graft ;  Surgeon: Rosetta Posner, MD;  Location: [facility] OR;  Service: Vascular;  Laterality: Left;  . Transthoracic echocardiogram  08/27/2011    EF 35-40%, mild-moderate concetric hypertrophy of the LV, moderately reduced LV systolic function,  . Abdominal aortagram N/A 12/23/2010    Procedure: ABDOMINAL Maxcine Ham;  Surgeon: Angelia Mould, MD;  Location: [facility] CATH LAB;  Service: Cardiovascular;  Laterality: N/A;  . Lower extremity angiogram  12/23/2010    Procedure: LOWER EXTREMITY ANGIOGRAM;  Surgeon: Angelia Mould, MD;  Location: [facility] CATH LAB;  Service: Cardiovascular;;   Family History  Problem Relation Age of Onset  . Alcohol abuse      family h/o addiction/alcoholism  . Diabetes Cousin     first degree relatives  . Kidney disease Mother   . Kidney disease Maternal Uncle   . Cancer Sister   . Heart disease Father    Social History  Substance Use Topics  . Smoking status: Never Smoker   . Smokeless tobacco: Never Used  . Alcohol Use: No   OB History    No data available     Review of Systems  All other systems reviewed and are negative.     Allergies  Penicillins  Home Medications   Prior to Admission medications   Medication Sig Start Date End Date Taking? Authorizing Provider  acetaminophen (TYLENOL) 325 MG tablet Take 650 mg by mouth every 4 (four) hours as needed for mild pain.   Yes Historical Provider, MD  aspirin 81 MG chewable tablet Chew 81 mg by mouth daily at 12 noon.    Yes Historical Provider, MD  cinacalcet (SENSIPAR) 30 MG tablet Take 30 mg by mouth every morning.   Yes Historical Provider, MD  ciprofloxacin (CIPRO) 500 MG tablet Take 1 tablet (500 mg total) by mouth daily. 06/30/15  Yes Waynetta Pean, PA-C  Darunavir Ethanolate (PREZISTA) 800 MG tablet Take 1 tablet (800 mg  total) by mouth daily with breakfast. 09/02/11  Yes Nino Glow McLean-Scocozza, MD  HYDROcodone-acetaminophen (NORCO/VICODIN) 5-325 MG per tablet Take one tablet by mouth every 4 hours as needed for pain 03/27/14  Yes Annita Brod, MD  lamiVUDine (EPIVIR) 10 MG/ML solution Take 50 mg by mouth daily at 12 noon.   Yes Historical Provider, MD  midodrine (PROAMATINE) 10 MG tablet Take 10 mg by mouth 2 (two) times daily. At 630am and 10pm   Yes Historical Provider, MD  Multiple Vitamins-Minerals (CERTAGEN PO) Take 1 tablet by mouth daily at 12 noon.   Yes Historical Provider, MD  raltegravir (ISENTRESS) 400 MG tablet Take 1 tablet (400 mg total) by mouth 2 (two) times daily. 04/15/13  Yes Truman Hayward, MD  ritonavir (NORVIR) 100 MG TABS Take 1 tablet (100 mg total) by mouth daily  with breakfast. 09/02/11  Yes Nino Glow McLean-Scocozza, MD  sevelamer carbonate (RENVELA) 800 MG tablet Take 1,600 mg by mouth 3 (three) times daily with meals.   Yes Historical Provider, MD  tenofovir (VIREAD) 300 MG tablet Take 300 mg by mouth once a week. On Fridays   Yes Historical Provider, MD   BP 133/79 mmHg  Pulse 87  Temp(Src) 98.4 F (36.9 C) (Oral)  Resp 20  Ht 5\' 4"  (1.626 m)  Wt 219 lb 11.2 oz (99.655 kg)  BMI 37.69 kg/m2  SpO2 98% Physical Exam  Constitutional: She is oriented to person, place, and time. She appears well-developed and well-nourished. No distress.  HENT:  Head: Normocephalic and atraumatic.  Mouth/Throat: Oropharynx is clear and moist.  The left outer ear is swollen and erythematous. There is copious purulent discharge coming out of the ear canal. The TM is noted to have small vesicles.  Neck: Normal range of motion. Neck supple.  Cardiovascular: Normal rate and regular rhythm.  Exam reveals no gallop and no friction rub.   No murmur heard. Pulmonary/Chest: Effort normal and breath sounds normal. No respiratory distress. She has no wheezes.  Abdominal: Soft. Bowel sounds are normal.  She exhibits no distension. There is no tenderness.  Musculoskeletal: Normal range of motion.  Neurological: She is alert and oriented to person, place, and time. A cranial nerve deficit is present. She exhibits normal muscle tone. Coordination normal.  She also appears to have a slight facial droop to the left.  Skin: Skin is warm and dry. She is not diaphoretic.  Nursing note and vitals reviewed.   ED Course  Procedures (including critical care time) Labs Review Labs Reviewed  CD4/CD8 (T-HELPER/T-SUPPRESSOR CELL)  HIV 1 RNA QUANT-NO REFLEX-BLD  COMPREHENSIVE METABOLIC PANEL  CBC    Imaging Review No results found. I have personally reviewed and evaluated these images and lab results as part of my medical decision-making.   EKG Interpretation None      MDM   Final diagnoses:  None    The patient was evaluated by Dr. Megan Salon from infectious disease. 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 He does not feel as though the patient requires any imaging but would like the patient admitted for steroids, antivirals, and monitoring of her condition. She will be admitted to the hospitalist service under the care of Dr. Lorin Mercy.    Veryl Speak, MD 07/19/15 2215

## 2015-07-19 NOTE — Consult Note (Signed)
          [place] for Infectious Disease    Date of Admission:  07/19/2015     I have seen and examined [name] with Dr. Benjamine Mola. Please see his separate, complete consult note. His [name] is a 60 year old with HIV and end-stage renal disease on hemodialysis. When last seen by my partner, Dr. Tommy Medal, in clinic one year ago her HIV infection was under good control. She states that she has been taking her 5 drug antiretroviral regimen. She developed left ear pain in mid June and was seen in the emergency department on 06/23/2015. She was noted to have bilateral cerumen impaction. Her ears were irrigated and they noted that her ear exam was then normal. She returned on 06/30/2015 with left ear pain and was noted to have vesicles in the external canal. She was treated with oral ciprofloxacin and ciprofloxacin eardrops. She was referred to ENT and has been seen twice recently. She was noted to have external ear dermatitis with drainage. Cultures of drainage grew enterococcus, actinomyces and diphtheroids which are likely to represent colonization with skin flora. She was noted to have a microscopic tympanic membrane perforation. Over the last few days she has developed left facial weakness. She notes some ringing in her ears and some altered taste. She has a left facial nerve palsy with extensive ulceration and weeping of her external canal and pinna. 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 She has no headache and I think that bacterial infection with intracranial extension causing cranial nerve palsies is extremely unlikely. I do not think a head CT is needed at this point. I would like to get basic lab work and start her on valacyclovir and prednisone. She does need airborne and contact precautions.         Michel Bickers, MD [facility] [place] [facility] [phone] pager   [phone] cell 01/09/2015, 1:32 PM

## 2015-07-19 NOTE — Consult Note (Signed)
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 Start valacyclovir 1g PO TID and prednisone 60mg  PO daily for HZV 2. Airborne and contact isolation  3. Check basic labs for unmonitored HIV- CBC, Cmet, CD4, HIV viral load 4. Recommend hospital observation while starting treatment  Assessment: Herpes zoster oticus/Ramsay-Hunt Syndrome: Ms. Burkholder is now about 2-3 weeks into worsening left ear pain, pruritis, dysgeusia, tinnitus, and now facial nerve palsy, which in the setting of HIV is very suggestive of local herpes zoster activation affecting the left ear. The presence of vesicles on an earlier examination in the ED on 6/24 is highly typical for this viral reactivation. She has not responded to multiple empiric antibiotics given orally and topically over the past 3 weeks. Her lack of systemic inflammation would be unusual in the setting of a prolonged, purulent bacterial infection. The bacteria that were noted on culture from her ENT clinic are all common skin flora that are uncommon causes for a severe ear infection. She denies any vertigo, headache, or jaw pain that would commonly accompany an infection severe enough to cause facial nerve palsy due to compression. Based on all of these characteristics I think HZV activation is likely, and a severe bacterial infection is very unlikely.  We can start treatment for HZV with valacyclovir and prednisone. She needs to be on airborne and contact precautions for actively draining viral lesions. We do not need to rule out an  intracranial process or mastoiditis with head CT unless her symptoms change significantly. Because she does not have well documented follow up for her HIV since one year ago we should check this. Immunodeficiency from a lapse in her treatment could lead to a zoster reactivation.   HPI: JAZZLYNNE AMIS is a 60 y.o. female woman with a history of HIV complicated by dementia, systolic heart failure, and ESRD now on HD who was seen at ENT clinic today for her left ear pain and in discussion with her infectious disease doctor was referred to the ED. She has been suffering from ear pain since 6/17 when she was seen at the ED and found to have bilateral cerumen impaction. However her left ear pain and pruritis continued and she was seen again on 6/24 noted to have vesicles, inflammation, and clear drainage from the left ear canal. She also reports hearing bells ringing in her left ear intermittently and that food has been tasting abnormal since her symptoms started worsening.  She was discharged on oral and topical ciprofloxacin and followed up with ENT clinic about 2 weeks later. Her ear pain continued and at that time was changed to bactrim. 3 days later she followed up and fluid cultures were obtained with again instruction to return. Today her symptoms included worsening left facial droop and there was a pinpoint tympanic membrane perforation noted on examination. Her cultures also returned positive growing several bacteria- enterococcus, actinomyces, and diptheroids.   Review of Systems: Review of Systems  Constitutional: Negative for fever, chills and diaphoresis.  HENT: Positive for ear discharge, ear pain, hearing loss and tinnitus.   Eyes: Negative for blurred vision and photophobia.  Respiratory: Negative for shortness of breath.   Cardiovascular: Negative for chest pain.  Gastrointestinal: Negative for abdominal pain and diarrhea.  Genitourinary: Negative for dysuria.  Musculoskeletal: Negative  for falls.  Skin: Positive for itching.  Neurological: Positive for speech change and focal weakness. Negative for dizziness and headaches.  Endo/Heme/Allergies: Negative for environmental allergies.  Psychiatric/Behavioral: The patient is not nervous/anxious.     Past Medical History  Diagnosis Date  . Human immunodeficiency virus (HIV) disease ([place])   . Hypertension   . Clostridium difficile infection 04/04/10    07/2011 and 08/2011  . Bacteriuria, asymptomatic 04/04/10    Culture grew VRE sensitive to linesolid   . MGUS (monoclonal gammopathy of unknown significance)   . History of bacteremia     MSSA; 05/2011, 08/2011  . Anemia   . Systolic heart failure ([place])     08/27/11 EF 35-40%  . Cancer ([place])   . ESRD (end stage renal disease) ([place]) 03/19/2007    Started HD in 2010.  ESRD was due to HIV per pt. Gets HD on [address] at [facility] on TTS schedule.     . Peripheral vascular disease ([place])   . Endocarditis     MSSA  . 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 Clostridium difficile colitis   . Septicemia due to enterococcus [facility])     Social History  Substance Use Topics  . Smoking status: Never Smoker   . Smokeless tobacco: Never Used  . Alcohol Use: No    Family History  Problem Relation Age of Onset  . Alcohol abuse      family h/o addiction/alcoholism  . Diabetes Cousin     first degree relatives  . Kidney disease Mother   . Kidney disease Maternal Uncle   . Cancer Sister   . Heart disease Father    Allergies  Allergen Reactions  . Penicillins Nausea And Vomiting and Rash    OBJECTIVE: Blood pressure 133/79, pulse 87, temperature 98.4 F (36.9 C), temperature source Oral, resp. rate 20, height 5\' 4"  (1.626 m), weight 99.655 kg (219 lb 11.2 oz), SpO2 98 %.  Physical Exam GENERAL- alert, co-operative, NAD HEENT- Left facial droop, edematous pinna with yellow drainage, ear canal is moderately inflamed without edema,  TM appeared grossly intact CARDIAC- RRR, Early systolic murmur appreciated over LUSB RESP- CTAB, no wheezes or crackles. ABDOMEN- Soft, nontender, no guarding NEURO- Left facial nerve palsy EXTREMITIES- symmetric, mild pedal edema with compression stockings on SKIN- Several excoriations of varying age on forearms PSYCH- Impaired recall of events and sometimes inappropriate response to questions    Lab Results Lab Results  Component Value Date   WBC 6.2 03/25/2014   HGB 9.7* 03/25/2014   HCT 31.0* 03/25/2014   MCV 117.9* 03/25/2014   PLT 241 03/25/2014    Lab Results  Component Value Date   CREATININE 8.14* 03/25/2014   BUN 24* 03/25/2014   NA 137 03/25/2014   K 3.9 03/25/2014   CL 95* 03/25/2014   CO2 32 03/25/2014    Lab Results  Component Value Date   ALT <8 05/18/2013   AST 21 05/18/2013   ALKPHOS 135* 05/18/2013   BILITOT 0.5 05/18/2013     Microbiology: No results found for this or any previous visit (from the past 240 hour(s)).  Collier Salina, MD PGY-II Internal Medicine Resident Pager# [phone] 07/19/2015, 6:03 PM

## 2015-07-19 NOTE — ED Notes (Signed)
Attempted report x1. 

## 2015-07-20 DIAGNOSIS — F0280 Dementia in other diseases classified elsewhere without behavioral disturbance: Secondary | ICD-10-CM

## 2015-07-20 DIAGNOSIS — B20 Human immunodeficiency virus [HIV] disease: Secondary | ICD-10-CM

## 2015-07-20 DIAGNOSIS — E876 Hypokalemia: Secondary | ICD-10-CM | POA: Diagnosis present

## 2015-07-20 DIAGNOSIS — B028 Zoster with other complications: Secondary | ICD-10-CM | POA: Diagnosis not present

## 2015-07-20 DIAGNOSIS — E871 Hypo-osmolality and hyponatremia: Secondary | ICD-10-CM | POA: Diagnosis present

## 2015-07-20 DIAGNOSIS — Z21 Asymptomatic human immunodeficiency virus [HIV] infection status: Secondary | ICD-10-CM | POA: Diagnosis not present

## 2015-07-20 DIAGNOSIS — B0221 Postherpetic geniculate ganglionitis: Secondary | ICD-10-CM | POA: Diagnosis not present

## 2015-07-20 DIAGNOSIS — N186 End stage renal disease: Secondary | ICD-10-CM | POA: Diagnosis not present

## 2015-07-20 LAB — CBC
HCT: 33 % — ABNORMAL LOW (ref 36.0–46.0)
Hemoglobin: 10.7 g/dL — ABNORMAL LOW (ref 12.0–15.0)
MCH: 35.7 pg — ABNORMAL HIGH (ref 26.0–34.0)
MCHC: 32.4 g/dL (ref 30.0–36.0)
MCV: 110 fL — ABNORMAL HIGH (ref 78.0–100.0)
Platelets: 255 10*3/uL (ref 150–400)
RBC: 3 MIL/uL — ABNORMAL LOW (ref 3.87–5.11)
RDW: 12.8 % (ref 11.5–15.5)
WBC: 4 10*3/uL (ref 4.0–10.5)

## 2015-07-20 LAB — MRSA PCR SCREENING: MRSA by PCR: NEGATIVE

## 2015-07-20 LAB — BASIC METABOLIC PANEL
Anion gap: 12 (ref 5–15)
BUN: 7 mg/dL (ref 6–20)
CO2: 28 mmol/L (ref 22–32)
Calcium: 8.1 mg/dL — ABNORMAL LOW (ref 8.9–10.3)
Chloride: 94 mmol/L — ABNORMAL LOW (ref 101–111)
Creatinine, Ser: 4.43 mg/dL — ABNORMAL HIGH (ref 0.44–1.00)
GFR calc Af Amer: 12 mL/min — ABNORMAL LOW (ref >60)
GFR calc non Af Amer: 10 mL/min — ABNORMAL LOW (ref >60)
Glucose, Bld: 100 mg/dL — ABNORMAL HIGH (ref 65–99)
Potassium: 4 mmol/L (ref 3.5–5.1)
Sodium: 134 mmol/L — ABNORMAL LOW (ref 135–145)

## 2015-07-20 LAB — CD4/CD8 (T-HELPER/T-SUPPRESSOR CELL)
CD4 absolute: 300 /uL — ABNORMAL LOW (ref 500–1900)
CD4%: 20 % — ABNORMAL LOW (ref 30.0–60.0)
CD8 T Cell Abs: 570 /uL (ref 230–1000)
CD8tox: 38 % (ref 15.0–40.0)
Ratio: 0.53 — ABNORMAL LOW (ref 1.0–3.0)
Total lymphocyte count: 1480 /uL (ref 1000–4000)

## 2015-07-20 LAB — HIV-1 RNA QUANT-NO REFLEX-BLD
HIV 1 RNA Quant: 160 copies/mL
LOG10 HIV-1 RNA: 2.204 log10copy/mL

## 2015-07-20 MED ORDER — NEPRO/CARBSTEADY PO LIQD: 237.0000 mL | Freq: Three times a day (TID) | ORAL | Status: AC | PRN

## 2015-07-20 MED ORDER — SENNA 8.6 MG PO TABS
1.0000 | ORAL_TABLET | Freq: Two times a day (BID) | ORAL | Status: DC
  Administered 2015-07-20: 8.6 mg via ORAL
  Filled 2015-07-20 (×2): qty 1

## 2015-07-20 MED ORDER — VALACYCLOVIR HCL 500 MG PO TABS: 500.0000 mg | ORAL_TABLET | Freq: Every day | ORAL | Status: DC

## 2015-07-20 MED ORDER — CAMPHOR-MENTHOL 0.5-0.5 % EX LOTN
1.0000 "application " | TOPICAL_LOTION | Freq: Three times a day (TID) | CUTANEOUS | Status: DC | PRN
  Filled 2015-07-20: qty 222

## 2015-07-20 MED ORDER — DOCUSATE SODIUM 283 MG RE ENEM
1.0000 | ENEMA | RECTAL | Status: DC | PRN
  Filled 2015-07-20: qty 1

## 2015-07-20 MED ORDER — CERTAGEN PO TABS: ORAL_TABLET | Freq: Every day | ORAL | Status: DC

## 2015-07-20 MED ORDER — VALACYCLOVIR HCL 1 G PO TABS: 1000.0000 mg | ORAL_TABLET | Freq: Three times a day (TID) | ORAL | Status: DC

## 2015-07-20 MED ORDER — ZOLPIDEM TARTRATE 5 MG PO TABS: 5.0000 mg | ORAL_TABLET | Freq: Every evening | ORAL | Status: DC | PRN

## 2015-07-20 MED ORDER — HEPARIN SODIUM (PORCINE) 5000 UNIT/ML IJ SOLN
5000.0000 [IU] | Freq: Three times a day (TID) | INTRAMUSCULAR | Status: DC
  Administered 2015-07-20 (×2): 5000 [IU] via SUBCUTANEOUS
  Filled 2015-07-20 (×3): qty 1

## 2015-07-20 MED ORDER — TENOFOVIR DISOPROXIL FUMARATE 300 MG PO TABS
300.0000 mg | ORAL_TABLET | ORAL | Status: DC
Start: 2015-07-20 — End: 2015-07-21
  Administered 2015-07-20: 300 mg via ORAL
  Filled 2015-07-20: qty 1

## 2015-07-20 MED ORDER — ACETAMINOPHEN 325 MG PO TABS: 650.0000 mg | ORAL_TABLET | Freq: Four times a day (QID) | ORAL | Status: DC | PRN

## 2015-07-20 MED ORDER — CINACALCET HCL 30 MG PO TABS
30.0000 mg | ORAL_TABLET | Freq: Every day | ORAL | Status: DC
  Administered 2015-07-20: 30 mg via ORAL
  Filled 2015-07-20: qty 1

## 2015-07-20 MED ORDER — RENA-VITE PO TABS
1.0000 | ORAL_TABLET | Freq: Every day | ORAL | Status: DC
  Administered 2015-07-20: 1 via ORAL
  Filled 2015-07-20: qty 1

## 2015-07-20 MED ORDER — LAMIVUDINE 10 MG/ML PO SOLN
50.0000 mg | Freq: Every day | ORAL | Status: DC
  Administered 2015-07-20: 50 mg via ORAL
  Filled 2015-07-20: qty 5

## 2015-07-20 MED ORDER — MORPHINE SULFATE (PF) 2 MG/ML IV SOLN
2.0000 mg | INTRAVENOUS | Status: DC | PRN
  Filled 2015-07-20: qty 1

## 2015-07-20 MED ORDER — ONDANSETRON HCL 4 MG/2ML IJ SOLN: 4.0000 mg | Freq: Four times a day (QID) | INTRAMUSCULAR | Status: DC | PRN

## 2015-07-20 MED ORDER — RITONAVIR 100 MG PO TABS
100.0000 mg | ORAL_TABLET | Freq: Every day | ORAL | Status: DC
  Administered 2015-07-20: 100 mg via ORAL
  Filled 2015-07-20: qty 1

## 2015-07-20 MED ORDER — DOCUSATE SODIUM 100 MG PO CAPS: 100.0000 mg | ORAL_CAPSULE | Freq: Two times a day (BID) | ORAL | Status: AC

## 2015-07-20 MED ORDER — ACETAMINOPHEN 650 MG RE SUPP: 650.0000 mg | Freq: Four times a day (QID) | RECTAL | Status: DC | PRN

## 2015-07-20 MED ORDER — DOCUSATE SODIUM 100 MG PO CAPS
100.0000 mg | ORAL_CAPSULE | Freq: Two times a day (BID) | ORAL | Status: DC
  Administered 2015-07-20: 100 mg via ORAL
  Filled 2015-07-20 (×2): qty 1

## 2015-07-20 MED ORDER — MIDODRINE HCL 5 MG PO TABS
10.0000 mg | ORAL_TABLET | Freq: Two times a day (BID) | ORAL | Status: DC
  Administered 2015-07-20: 10 mg via ORAL
  Filled 2015-07-20: qty 2

## 2015-07-20 MED ORDER — ASPIRIN 81 MG PO CHEW
81.0000 mg | CHEWABLE_TABLET | Freq: Every day | ORAL | Status: DC
  Administered 2015-07-20: 81 mg via ORAL
  Filled 2015-07-20: qty 1

## 2015-07-20 MED ORDER — DARUNAVIR ETHANOLATE 800 MG PO TABS
800.0000 mg | ORAL_TABLET | Freq: Every day | ORAL | Status: DC
  Administered 2015-07-20: 800 mg via ORAL
  Filled 2015-07-20 (×2): qty 1

## 2015-07-20 MED ORDER — POTASSIUM CHLORIDE CRYS ER 20 MEQ PO TBCR
40.0000 meq | EXTENDED_RELEASE_TABLET | Freq: Once | ORAL | Status: AC
  Administered 2015-07-20: 40 meq via ORAL
  Filled 2015-07-20: qty 2

## 2015-07-20 MED ORDER — HYDROXYZINE HCL 25 MG PO TABS: 25.0000 mg | ORAL_TABLET | Freq: Three times a day (TID) | ORAL | Status: DC | PRN

## 2015-07-20 MED ORDER — RALTEGRAVIR POTASSIUM 400 MG PO TABS
400.0000 mg | ORAL_TABLET | Freq: Two times a day (BID) | ORAL | Status: DC
  Administered 2015-07-20 (×2): 400 mg via ORAL
  Filled 2015-07-20 (×3): qty 1

## 2015-07-20 MED ORDER — PREDNISONE 20 MG PO TABS: 60.0000 mg | ORAL_TABLET | Freq: Every day | ORAL | Status: DC

## 2015-07-20 MED ORDER — ONDANSETRON HCL 4 MG PO TABS: 4.0000 mg | ORAL_TABLET | Freq: Four times a day (QID) | ORAL | Status: DC | PRN

## 2015-07-20 MED ORDER — NEPRO/CARBSTEADY PO LIQD: 237.0000 mL | Freq: Three times a day (TID) | ORAL | Status: DC | PRN

## 2015-07-20 MED ORDER — CALCIUM CARBONATE 1250 MG/5ML PO SUSP: 500.0000 mg | Freq: Four times a day (QID) | ORAL | Status: DC | PRN

## 2015-07-20 MED ORDER — SORBITOL 70 % SOLN: 30.0000 mL | Status: DC | PRN

## 2015-07-20 MED ORDER — SEVELAMER CARBONATE 800 MG PO TABS
1600.0000 mg | ORAL_TABLET | Freq: Three times a day (TID) | ORAL | Status: DC
  Administered 2015-07-20 (×2): 1600 mg via ORAL
  Filled 2015-07-20 (×2): qty 2

## 2015-07-20 NOTE — Progress Notes (Signed)
Report called to [facility] Nurse at [facility]. Pt ready for D/c

## 2015-07-20 NOTE — Discharge Summary (Addendum)
Physician Discharge Summary  [name] [id number] DOB: 09/10/1955 DOA: 07/19/2015  PCP: No primary care provider on file.  Admit date: 07/19/2015 Discharge date: 07/20/2015  Admitted From: [facility].   Disposition:  SNF.   Recommendations for Outpatient Follow-up:  1. Follow up with PCP in 1-2 weeks 2. Please obtain BMP/CBC in one week 3. Please follow up with ID as recommended.     Discharge Condition:stable.  CODE STATUS (FULL, Diet recommendation: Heart Healthy /  Brief/Interim Summary: [name] is a 60 y.o. female with medical history significant of HIV with dementia and ESRD on Tu/Th/Sat HD. She started with an ear infection on the left with pain 2 weeks ago. SHE WAS diagnosed with Ramsay Hunt syndrome. ID consulted and she was started on prednisone and valtrex.   Discharge Diagnoses:  Principal Problem:   Herpes zoster oticus Active Problems:   Human immunodeficiency virus (HIV) disease ([place])   ESRD (end stage renal disease) ([place])   HIV dementia ([place])   Hyponatremia   Hypokalemia  Herpes Zoster oticus: Started on PREDNISONE and valtrex.  Symptoms much improved.  Discharged on 4 more days of prednisone and 9 more days of valtrex.  Recommend outpatient follow up with ID as recommended.   HIV:  Resume anti HIV meds.   Hypokalemia: Repleted.   Hyponatremia: improved.   ESRD: resume HD on TTS.   Discharge Instructions  Discharge Instructions    Discharge instructions    Complete by:  As directed   Please follow up with ID as recommended possibly in the next 1 to 2  Weeks.            Medication List    STOP taking these medications        HYDROcodone-acetaminophen 5-325 MG tablet  Commonly known as:  NORCO/VICODIN      TAKE these medications        acetaminophen 325 MG tablet  Commonly known as:  TYLENOL  Take 650 mg by mouth every 4 (four) hours as needed for mild pain.     aspirin 81 MG chewable tablet  Chew 81 mg by  mouth daily at 12 noon.     CERTAGEN PO  Take 1 tablet by mouth daily at 12 noon.     cinacalcet 30 MG tablet  Commonly known as:  SENSIPAR  Take 30 mg by mouth every morning.     Darunavir Ethanolate 800 MG tablet  Commonly known as:  PREZISTA  Take 1 tablet (800 mg total) by mouth daily with breakfast.     docusate sodium 100 MG capsule  Commonly known as:  COLACE  Take 1 capsule (100 mg total) by mouth 2 (two) times daily.     feeding supplement (NEPRO CARB STEADY) Liqd  Take 237 mLs by mouth 3 (three) times daily as needed (Supplement).     lamiVUDine 10 MG/ML solution  Commonly known as:  EPIVIR  Take 50 mg by mouth daily at 12 noon.     midodrine 10 MG tablet  Commonly known as:  PROAMATINE  Take 10 mg by mouth 2 (two) times daily. At 630am and 10pm     predniSONE 20 MG tablet  Commonly known as:  DELTASONE  Take 3 tablets (60 mg total) by mouth daily with breakfast.     raltegravir 400 MG tablet  Commonly known as:  ISENTRESS  Take 1 tablet (400 mg total) by mouth 2 (two) times daily.     ritonavir  100 MG Tabs tablet  Commonly known as:  NORVIR  Take 1 tablet (100 mg total) by mouth daily with breakfast.     sevelamer carbonate 800 MG tablet  Commonly known as:  RENVELA  Take 1,600 mg by mouth 3 (three) times daily with meals.     tenofovir 300 MG tablet  Commonly known as:  VIREAD  Take 300 mg by mouth once a week. On Fridays     valACYclovir 500 MG tablet  Commonly known as:  VALTREX  Take 1 tablet (500 mg total) by mouth at bedtime.  Start taking on:  07/21/2015           Follow-up Information    Follow up with Carlyle Basques, MD. Schedule an appointment as soon as possible for a visit in 2 weeks.   Specialty:  Infectious Diseases   Contact information:   [address] [place] [address] [phone]      Allergies  Allergen Reactions  . Penicillins Nausea And Vomiting and Rash     Consultations: ID  Procedures/Studies:  No results found.    Subjective: No new complaints.   Discharge Exam: Filed Vitals:   07/20/15 0544 07/20/15 0945  BP: 125/75 107/58  Pulse: 94 105  Temp: 98.8 F (37.1 C) 98.5 F (36.9 C)  Resp: 18 18   Filed Vitals:   07/19/15 2330 07/20/15 0045 07/20/15 0544 07/20/15 0945  BP: 104/73 143/79 125/75 107/58  Pulse: 81 87 94 105  Temp:  98.1 F (36.7 C) 98.8 F (37.1 C) 98.5 F (36.9 C)  TempSrc:  Oral Oral Oral  Resp:  19 18 18   Height:  5\' 4"  (1.626 m)    Weight:  98.4 kg (216 lb 14.9 oz)    SpO2: 91% 97% 95% 94%    General: Pt is alert, awake, not in acute distress, with bandage over the left ear.  Cardiovascular: RRR, S1/S2 +, no rubs, no gallops Respiratory: CTA bilaterally, no wheezing, no rhonchi Abdominal: Soft, NT, ND, bowel sounds + Extremities: no edema, no cyanosis    The results of significant diagnostics from this hospitalization (including imaging, microbiology, ancillary and laboratory) are listed below for reference.     Microbiology: Recent Results (from the past 240 hour(s))  MRSA PCR Screening     Status: None   Collection Time: 07/20/15  1:56 AM  Result Value Ref Range Status   MRSA by PCR NEGATIVE NEGATIVE Final    Comment:        The GeneXpert MRSA Assay (FDA approved for NASAL specimens only), is one component of a comprehensive MRSA colonization surveillance program. It is not intended to diagnose MRSA infection nor to guide or monitor treatment for MRSA infections.      Labs: BNP (last 3 results) No results for input(s): BNP in the last 8760 hours. Basic Metabolic Panel:  Recent Labs Lab 07/19/15 1853 07/20/15 0420  NA 131* 134*  K 3.0* 4.0  CL 91* 94*  CO2 30 28  GLUCOSE 88 100*  BUN <5* 7  CREATININE 3.33* 4.43*  CALCIUM 7.7* 8.1*   Liver Function Tests:  Recent Labs Lab 07/19/15 1853  AST 26  ALT 13*  ALKPHOS 181*  BILITOT 0.5  PROT 7.9  ALBUMIN 3.1*    No results for input(s): LIPASE, AMYLASE in the last 168 hours. No results for input(s): AMMONIA in the last 168 hours. CBC:  Recent Labs Lab 07/19/15 1853 07/20/15 0420  WBC 4.4 4.0  HGB 9.5* 10.7*  HCT 30.5* 33.0*  MCV 110.1* 110.0*  PLT 243 255   Cardiac Enzymes: No results for input(s): CKTOTAL, CKMB, CKMBINDEX, TROPONINI in the last 168 hours. BNP: Invalid input(s): POCBNP CBG: No results for input(s): GLUCAP in the last 168 hours. D-Dimer No results for input(s): DDIMER in the last 72 hours. Hgb A1c No results for input(s): HGBA1C in the last 72 hours. Lipid Profile No results for input(s): CHOL, HDL, LDLCALC, TRIG, CHOLHDL, LDLDIRECT in the last 72 hours. Thyroid function studies No results for input(s): TSH, T4TOTAL, T3FREE, THYROIDAB in the last 72 hours.  Invalid input(s): FREET3 Anemia work up No results for input(s): VITAMINB12, FOLATE, FERRITIN, TIBC, IRON, RETICCTPCT in the last 72 hours. Urinalysis    Component Value Date/Time   COLORURINE YELLOW 11/30/2007 0730   APPEARANCEUR CLOUDY* 11/30/2007 0730   LABSPEC 1.024 11/30/2007 0730   PHURINE 5.5 11/30/2007 0730   GLUCOSEU NEGATIVE 11/30/2007 0730   HGBUR LARGE* 11/30/2007 0730   BILIRUBINUR SMALL* 11/30/2007 0730   KETONESUR 15* 11/30/2007 0730   PROTEINUR >300* 11/30/2007 0730   UROBILINOGEN 1.0 11/30/2007 0730   NITRITE NEGATIVE 11/30/2007 0730   LEUKOCYTESUR MODERATE* 11/30/2007 0730   Sepsis Labs Invalid input(s): PROCALCITONIN,  WBC,  LACTICIDVEN Microbiology Recent Results (from the past 240 hour(s))  MRSA PCR Screening     Status: None   Collection Time: 07/20/15  1:56 AM  Result Value Ref Range Status   MRSA by PCR NEGATIVE NEGATIVE Final    Comment:        The GeneXpert MRSA Assay (FDA approved for NASAL specimens only), is one component of a comprehensive MRSA colonization surveillance program. It is not intended to diagnose MRSA infection nor to guide or monitor treatment  for MRSA infections.      Time coordinating discharge: Over 30 minutes  SIGNED:   Hosie Poisson, MD  [facility] 07/20/2015, 4:31 PM Pager [phone]  If 7PM-7AM, please contact night-coverage [email or web address] Password [email or web address]

## 2015-07-20 NOTE — Progress Notes (Signed)
New Admission Note:  Arrival Method: Via stretcher with nurse Tech Mental Orientation: Alert and oriented x4 Telemetry:n/a Assessment: Completed Skin: generalized scab , weeping wound (left ear) IV: left AC saline locked Pain: denies any pain Tubes: n/a Safety Measures: Safety Fall Prevention Plan discussed. Admission: Completed [address] [place] Orientation: Patient has been orientated to the room, unit and the staff. Family: none at bedside  Orders have been reviewed and implemented. Will continue to monitor the patient. Call light has been placed within reach and bed alarm has been activated.   Leandro Reasoner BSN, RN  Phone Number: [phone] [facility]

## 2015-07-20 NOTE — Progress Notes (Signed)
[facility] for Infectious Disease    Date of Admission:  07/19/2015   Total days of treatment 2        Day 2 valacyclovir  Principal Problem:   Herpes zoster oticus Active Problems:   Human immunodeficiency virus (HIV) disease ([place])   ESRD (end stage renal disease) ([facility])   HIV dementia ([facility])   Hyponatremia   Hypokalemia   . aspirin  81 mg Oral Q1200  . cinacalcet  30 mg Oral Q breakfast  . Darunavir Ethanolate  800 mg Oral Q breakfast  . docusate sodium  100 mg Oral BID  . heparin  5,000 Units Subcutaneous Q8H  . lamiVUDine  50 mg Oral Q1200  . midodrine  10 mg Oral BID WC  . multivitamin  1 tablet Oral QHS  . predniSONE  60 mg Oral Q breakfast  . raltegravir  400 mg Oral BID  . ritonavir  100 mg Oral Q breakfast  . senna  1 tablet Oral BID  . sevelamer carbonate  1,600 mg Oral TID WC  . tenofovir  300 mg Oral Q Fri  . valACYclovir  1,000 mg Oral Q8H    SUBJECTIVE: [name] reports improvement in her ear pain today. She also thinks her ear ringing and abnormal taste are partially better.  Review of Systems: Review of Systems  Constitutional: Negative for fever.  HENT: Positive for ear discharge, ear pain and tinnitus.   Eyes: Negative for blurred vision.  Neurological: Positive for focal weakness. Negative for dizziness and headaches.    Past Medical History  Diagnosis Date  . Human immunodeficiency virus (HIV) disease ([place])   . Hypertension   . Clostridium difficile infection 04/04/10    07/2011 and 08/2011  . Bacteriuria, asymptomatic 04/04/10    Culture grew VRE sensitive to linesolid   . MGUS (monoclonal gammopathy of unknown significance)   . History of bacteremia     MSSA; 05/2011, 08/2011  . Anemia   . Systolic heart failure ([place])     08/27/11 EF 35-40%  . Cancer ([place])   . ESRD (end stage renal disease) ([place]) 03/19/2007    Started HD in 2010.  ESRD was due to HIV per pt. Gets HD on [address] at [facility] on TTS schedule.     . Peripheral  vascular disease ([place])   . Endocarditis     MSSA  . 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 Septicemia due to enterococcus [facility])     Social History  Substance Use Topics  . 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 Smokeless tobacco: Never Used     Comment: quit when she was diagnosed with HIV  . Alcohol Use: No    Family History  Problem Relation Age of Onset  . Alcohol abuse      family h/o addiction/alcoholism  . Diabetes Cousin     first degree relatives  . Kidney disease Mother   . Kidney disease Maternal Uncle   . Cancer Sister   . Heart disease Father    Allergies  Allergen Reactions  . Penicillins Nausea And Vomiting and Rash    OBJECTIVE: Filed Vitals:   07/19/15 2330 07/20/15 0045 07/20/15 0544 07/20/15 0945  BP: 104/73 143/79 125/75 107/58  Pulse: 81 87 94 105  Temp:  98.1 F (36.7 C) 98.8 F (37.1 C) 98.5 F (36.9 C)  TempSrc:  Oral Oral Oral  Resp:  19 18 18   Height:  5\' 4"  (1.626 m)    Weight:  98.4 kg (216 lb 14.9 oz)    SpO2: 91% 97% 95% 94%   Body mass index is 37.22 kg/(m^2).  Physical Exam GENERAL- alert, co-operative, NAD HEENT- Left facial droop, very edematous pinna with yellow drainage CARDIAC- RRR, Early systolic murmur over LUSB RESP- CTAB, no wheezes or crackles. NEURO- Left facial nerve weakness, normal sensation PSYCH- Appropriate speech  Lab Results Lab Results  Component Value Date   WBC 4.0 07/20/2015   HGB 10.7* 07/20/2015   HCT 33.0* 07/20/2015   MCV 110.0* 07/20/2015   PLT 255 07/20/2015    Lab Results  Component Value Date   CREATININE 4.43* 07/20/2015   BUN 7 07/20/2015   NA 134* 07/20/2015   K 4.0 07/20/2015   CL 94* 07/20/2015   CO2 28 07/20/2015    Lab Results  Component Value Date   ALT 13* 07/19/2015   AST 26 07/19/2015   ALKPHOS 181* 07/19/2015   BILITOT 0.5 07/19/2015     Microbiology: Recent Results (from the past 240 hour(s))  MRSA PCR Screening      Status: None   Collection Time: 07/20/15  1:56 AM  Result Value Ref Range Status   MRSA by PCR NEGATIVE NEGATIVE Final    Comment:        The GeneXpert MRSA Assay (FDA approved for NASAL specimens only), is one component of a comprehensive MRSA colonization surveillance program. It is not intended to diagnose MRSA infection nor to guide or monitor treatment for MRSA infections.      ASSESSMENT: Ramsay-Hunt Syndrome: [name] feels better this morning and objectively her left facial weakness is milder than on examination last night. This is from starting treatment on prednisone and should continue improving in the next few days. There is ongoing yellowish drainage from her ear and she may have bacterial superinfection of her lesions, but it is not very severe at this time and will likely improve with basic hygiene. Irrigation may help with clearing debris faster. She can continue her current oral treatments outpatient with follow up and also ID clinic follow up for her HIV labs.  PLAN: 1. Continue prednisone 60mg  PO daily for total of 5 days (through 7/17) 2. Continue valacyclovir 1g PO TID for a total of 10 days (through 7/22) 3. Recommend basic skin hygiene and possibly irrigation for ear canal debris and drainage 4. Will follow up at ID clinic for HIV maintenance  Collier Salina, MD PGY-II Internal Medicine Resident Pager# [phone] 07/20/2015, 11:56 AM

## 2015-07-20 NOTE — Clinical Social Work Note (Signed)
[name] is medically stable for discharge and will return to [facility]. Discharge clinicals transmitted to facility and patient will be transported by ambulance. CSW called patient's sister [name] and left messages regarding patient's discharge on her mobile and home phones. Call made to patient's daughter [name] but wrong number.  Raquon Milledge Givens, MSW, LCSW Licensed Clinical Social Worker [place] [phone]

## 2015-07-20 NOTE — NC FL2 (Signed)
[place] MEDICAID FL2 LEVEL OF CARE SCREENING TOOL     IDENTIFICATION  Patient Name: [name] Birthdate: 24-Apr-1955 Sex: female Admission Date (Current Location): 07/19/2015  [place] and [place] Number:  [name] [id number] [place] and Address:  [place]. [facility], [place] [address] [place], [place] [address]      Provider Number: [id number]  Attending Physician Name and Address:  Hosie Poisson, MD  Relative Name and Phone Number:  Khrystyna Wildrick - sister - [phone] (cell) and [phone] (home). Daughter Taziah Klosterman [phone] (c)    Current Level of Care: Hospital Recommended Level of Care: [place] (Patient from [facility]) Prior Approval Number:    Date Approved/Denied:   PASRR Number:    Discharge Plan: SNF    Current Diagnoses: Patient Active Problem List   Diagnosis Date Noted  . Hyponatremia 07/20/2015  . Hypokalemia 07/20/2015  . 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 HIV dementia ([place]) 04/12/2014  . Chronic respiratory failure with hypoxia ([place]) 03/27/2014  . Obesity (BMI 30-39.9) 03/24/2014  . Hypotension, intermittent esp after HD 08/22/2011  . LEG EDEMA, CHRONIC 04/15/2007  . Human immunodeficiency virus (HIV) disease ([place]) 03/19/2007  . ANEMIA OF CHRONIC DISEASE 03/19/2007  . ESRD (end stage renal disease) ([place]) 03/19/2007    Orientation RESPIRATION BLADDER Height & Weight     Self, Time, Situation, Place  Normal Continent Weight: 216 lb 14.9 oz (98.4 kg) Height:  5\' 4"  (162.6 cm)  BEHAVIORAL SYMPTOMS/MOOD NEUROLOGICAL BOWEL NUTRITION STATUS      Continent Diet (Renal with fluid restriction)  AMBULATORY STATUS COMMUNICATION OF NEEDS Skin   Limited Assist Verbally Other (Comment) (Left ear swollen and weeping with pus-like drainage-guaze dressing. Open scabs on right hand and back.[name])                       Personal Care Assistance Level of Assistance  Bathing, Feeding, Dressing Bathing Assistance:  Limited assistance Feeding assistance: Independent Dressing Assistance: Limited assistance     Functional Limitations Info  Sight, Hearing, Speech Sight Info: Adequate Hearing Info: Adequate Speech Info: Adequate    SPECIAL CARE FACTORS FREQUENCY                       Contractures Contractures Info: Not present    Additional Factors Info  Code Status, Allergies Code Status Info: Full Allergies Info: Penicillins           Current Medications (07/20/2015):  This is the current hospital active medication list Current Facility-Administered Medications  Medication Dose Route Frequency Provider Last Rate Last Dose  . acetaminophen (TYLENOL) tablet 650 mg  650 mg Oral Q6H PRN Karmen Bongo, MD       Or  . acetaminophen (TYLENOL) suppository 650 mg  650 mg Rectal Q6H PRN Karmen Bongo, MD      . aspirin chewable tablet 81 mg  81 mg Oral Q1200 Karmen Bongo, MD   81 mg at 07/20/15 1217  . calcium carbonate (dosed in mg elemental calcium) suspension 500 mg of elemental calcium  500 mg of elemental calcium Oral Q6H PRN Karmen Bongo, MD      . camphor-menthol [facility]) lotion 1 application  1 application Topical [id number] PRN Karmen Bongo, MD       And  . hydrOXYzine (ATARAX/VISTARIL) tablet 25 mg  25 mg Oral Q8H PRN Karmen Bongo, MD      . cinacalcet [facility]) tablet 30 mg  30 mg  Oral Q breakfast Karmen Bongo, MD   30 mg at 07/20/15 0837  . Darunavir Ethanolate (PREZISTA) tablet 800 mg  800 mg Oral Q breakfast Karmen Bongo, MD   800 mg at 07/20/15 [id number]  . docusate sodium (COLACE) capsule 100 mg  100 mg Oral BID Karmen Bongo, MD   100 mg at 07/20/15 1217  . docusate sodium (ENEMEEZ) enema 283 mg  1 enema Rectal PRN Karmen Bongo, MD      . feeding supplement (NEPRO CARB STEADY) liquid 237 mL  237 mL Oral TID PRN Karmen Bongo, MD      . heparin injection 5,000 Units  5,000 Units Subcutaneous Q8H Karmen Bongo, MD   5,000 Units at 07/20/15 1313  . lamiVUDine (EPIVIR) 10  MG/ML solution 50 mg  50 mg Oral Q1200 Karmen Bongo, MD   50 mg at 07/20/15 1217  . midodrine (PROAMATINE) tablet 10 mg  10 mg Oral BID WC Karmen Bongo, MD   10 mg at 07/20/15 0837  . morphine 2 MG/ML injection 2 mg  2 mg Intravenous Q2H PRN Karmen Bongo, MD      . multivitamin (RENA-VIT) tablet 1 tablet  1 tablet Oral QHS Karmen Bongo, MD   1 tablet at 07/20/15 0121  . ondansetron (ZOFRAN) tablet 4 mg  4 mg Oral Q6H PRN Karmen Bongo, MD       Or  . ondansetron [facility]) injection 4 mg  4 mg Intravenous Q6H PRN Karmen Bongo, MD      . predniSONE (DELTASONE) tablet 60 mg  60 mg Oral Q breakfast Collier Salina, MD   60 mg at 07/20/15 0838  . raltegravir (ISENTRESS) tablet 400 mg  400 mg Oral BID Karmen Bongo, MD   400 mg at 07/20/15 1217  . ritonavir (NORVIR) tablet 100 mg  100 mg Oral Q breakfast Karmen Bongo, MD   100 mg at 07/20/15 [id number]  . senna (SENOKOT) tablet 8.6 mg  1 tablet Oral BID Karmen Bongo, MD   8.6 mg at 07/20/15 1216  . sevelamer carbonate (RENVELA) tablet 1,600 mg  1,600 mg Oral TID WC Karmen Bongo, MD   1,600 mg at 07/20/15 1216  . sorbitol 70 % solution 30 mL  30 mL Oral PRN Karmen Bongo, MD      . tenofovir Veva [facility]oles) tablet 300 mg  300 mg Oral Q Warren Danes, MD   300 mg at 07/20/15 1217  . [START ON 07/21/2015] valACYclovir (VALTREX) tablet 500 mg  500 mg Oral QHS Collier Salina, MD      . zolpidem (AMBIEN) tablet 5 mg  5 mg Oral QHS PRN Karmen Bongo, MD         Discharge Medications: Please see discharge summary for a list of discharge medications.  Relevant Imaging Results:  Relevant Lab Results:   Additional Information Dialysis TTS at [facility].  Sable Feil, LCSW

## 2015-07-20 NOTE — Progress Notes (Signed)
   07/20/15 1428  Clinical Encounter Type  Visited With Patient  Visit Type Initial  Referral From Social work  Consult/Referral To Chaplain  Spiritual Encounters  Spiritual Needs Prayer;Emotional  Stress Factors  Patient Stress Factors Exhausted;Health changes;Loss of control  Chaplain visit made per SW, patient feeling poorly, provided spiritual presence, emotional support and prayer. Advised that continued support services available 24/7 as needed.

## 2015-07-20 NOTE — Progress Notes (Signed)
As an addendum to my previously stated plan, valacyclovir dosing will be adjusted for her ESRD to 500mg  PO daily for herpes zoster, to complete 10 days of total treatment.  Collier Salina, MD PGY-II Internal Medicine Resident Pager# [phone] 07/20/2015, 1:29 PM

## 2015-07-20 NOTE — Clinical Social Work Note (Signed)
Clinical Social Work Assessment  Patient Details  Name: [name] MRN: [id number] Date of Birth: 07/28/1955  Date of referral:  07/20/15               Reason for consult:  Facility Placement                Permission sought to share information with:  Family Supports Permission granted to share information::  Yes, Verbal Permission Granted  Name::     Marcy Siren or Minus Liberty  Agency::     Relationship::  Sister or Daughter  Sport and exercise psychologist Information:  Enid Derry [phone] and Danae Chen [phone]  Housing/Transportation Living arrangements for the past 2 months:  [place] (Patient LTC resident at [facility]) Source of Information:  Patient, Facility ([place]) Patient Interpreter Needed:  None Criminal Activity/Legal Involvement Pertinent to Current Situation/Hospitalization:  No - Comment as needed Significant Relationships:  Adult Children, Siblings Lives with:  Facility Resident Do you feel safe going back to the place where you live?  Yes Need for family participation in patient care:  Yes (Comment)  Care giving concerns:  Patient expressed no concerns regarding her facility placement. She reported to [place] that she has been at [facility] a long time.  Social Worker assessment / plan:  CSW talked with patient at the bedside regarding being medically stable for discharge and her discharge plan. Ms. Strosnider confirmed she is from [facility] and plans to return there. She was agreeable to CSW contacting family about her discharge.  Employment status:  Disabled (Comment on whether or not currently receiving Disability) Insurance information:  Medicaid In [place] PT Recommendations:  Not assessed at this time Information / Referral to community resources:  [place] (SNF information not needed as patient from a facility)  Patient/Family's Response to care:  No concerns expressed regarding her care during hospitalization.  Patient/Family's  Understanding of and Emotional Response to Diagnosis, Current Treatment, and Prognosis:  Not discussed.  Emotional Assessment Appearance:  Appears older than stated age Attitude/Demeanor/Rapport:  Other (Appropriate) Affect (typically observed):  Appropriate Orientation:  Oriented to Self, Oriented to Place, Oriented to  Time, Oriented to Situation Alcohol / Substance use:  Tobacco Use, Alcohol Use, Illicit Drugs (Patient reported that she is a former smoker, does not drink alcohol and former cocaine user-quitting 10 years ago.) Psych involvement (Current and /or in the community):  No (Comment)  Discharge Needs  Concerns to be addressed:  Discharge Planning Concerns Readmission within the last 30 days:  No Current discharge risk:  None Barriers to Discharge:  No Barriers Identified   Sable Feil, LCSW 07/20/2015, 5:46 PM

## 2015-07-20 NOTE — Progress Notes (Signed)
Patient left with EMS via stretcher to [place] center. VSS prior to leaving. Pt alert and oriented. No complaints of pain or discomfort. IV to Left AC removed prior to leaving.       Tiandre Teall RN

## 2015-07-21 ENCOUNTER — Encounter (HOSPITAL_COMMUNITY): Payer: Self-pay

## 2015-07-21 ENCOUNTER — Non-Acute Institutional Stay (HOSPITAL_COMMUNITY)
Admission: EM | Admit: 2015-07-21 | Discharge: 2015-07-21 | Disposition: A | Discharge status: Skilled Nursing Facility | Payer: Medicaid Other | Attending: Nephrology | Admitting: Nephrology

## 2015-07-21 DIAGNOSIS — Z87891 Personal history of nicotine dependence: Secondary | ICD-10-CM | POA: Diagnosis not present

## 2015-07-21 DIAGNOSIS — R29810 Facial weakness: Secondary | ICD-10-CM | POA: Insufficient documentation

## 2015-07-21 DIAGNOSIS — Z880 Allergy status to penicillin: Secondary | ICD-10-CM | POA: Diagnosis not present

## 2015-07-21 DIAGNOSIS — Z992 Dependence on renal dialysis: Secondary | ICD-10-CM | POA: Diagnosis not present

## 2015-07-21 DIAGNOSIS — B0221 Postherpetic geniculate ganglionitis: Secondary | ICD-10-CM | POA: Diagnosis not present

## 2015-07-21 DIAGNOSIS — I132 Hypertensive heart and chronic kidney disease with heart failure and with stage 5 chronic kidney disease, or end stage renal disease: Secondary | ICD-10-CM | POA: Insufficient documentation

## 2015-07-21 DIAGNOSIS — I110 Hypertensive heart disease with heart failure: Secondary | ICD-10-CM | POA: Insufficient documentation

## 2015-07-21 DIAGNOSIS — N186 End stage renal disease: Secondary | ICD-10-CM | POA: Insufficient documentation

## 2015-07-21 DIAGNOSIS — B029 Zoster without complications: Secondary | ICD-10-CM | POA: Diagnosis not present

## 2015-07-21 DIAGNOSIS — Z21 Asymptomatic human immunodeficiency virus [HIV] infection status: Secondary | ICD-10-CM | POA: Diagnosis not present

## 2015-07-21 DIAGNOSIS — I5022 Chronic systolic (congestive) heart failure: Secondary | ICD-10-CM | POA: Insufficient documentation

## 2015-07-21 DIAGNOSIS — Z7982 Long term (current) use of aspirin: Secondary | ICD-10-CM | POA: Insufficient documentation

## 2015-07-21 LAB — CBC WITH DIFFERENTIAL/PLATELET
Basophils Absolute: 0 10*3/uL (ref 0.0–0.1)
Basophils Relative: 0 %
Eosinophils Absolute: 0 10*3/uL (ref 0.0–0.7)
Eosinophils Relative: 0 %
HCT: 29.4 % — ABNORMAL LOW (ref 36.0–46.0)
Hemoglobin: 9.6 g/dL — ABNORMAL LOW (ref 12.0–15.0)
Lymphocytes Relative: 17 %
Lymphs Abs: 1.4 10*3/uL (ref 0.7–4.0)
MCH: 35.3 pg — ABNORMAL HIGH (ref 26.0–34.0)
MCHC: 32.7 g/dL (ref 30.0–36.0)
MCV: 108.1 fL — ABNORMAL HIGH (ref 78.0–100.0)
Monocytes Absolute: 0.1 10*3/uL (ref 0.1–1.0)
Monocytes Relative: 2 %
Neutro Abs: 6.8 10*3/uL (ref 1.7–7.7)
Neutrophils Relative %: 81 %
Platelets: 328 10*3/uL (ref 150–400)
RBC: 2.72 MIL/uL — ABNORMAL LOW (ref 3.87–5.11)
RDW: 12.9 % (ref 11.5–15.5)
WBC: 8.4 10*3/uL (ref 4.0–10.5)

## 2015-07-21 LAB — BASIC METABOLIC PANEL
Anion gap: 12 (ref 5–15)
BUN: 41 mg/dL — ABNORMAL HIGH (ref 6–20)
CO2: 26 mmol/L (ref 22–32)
Calcium: 8.2 mg/dL — ABNORMAL LOW (ref 8.9–10.3)
Chloride: 95 mmol/L — ABNORMAL LOW (ref 101–111)
Creatinine, Ser: 7.21 mg/dL — ABNORMAL HIGH (ref 0.44–1.00)
GFR calc Af Amer: 6 mL/min — ABNORMAL LOW (ref >60)
GFR calc non Af Amer: 6 mL/min — ABNORMAL LOW (ref >60)
Glucose, Bld: 123 mg/dL — ABNORMAL HIGH (ref 65–99)
Potassium: 4.5 mmol/L (ref 3.5–5.1)
Sodium: 133 mmol/L — ABNORMAL LOW (ref 135–145)

## 2015-07-21 MED ORDER — HEPARIN SODIUM (PORCINE) 1000 UNIT/ML DIALYSIS: 1000.0000 [IU] | INTRAMUSCULAR | Status: DC | PRN

## 2015-07-21 MED ORDER — LIDOCAINE-PRILOCAINE 2.5-2.5 % EX CREA
1.0000 | TOPICAL_CREAM | CUTANEOUS | Status: DC | PRN
Start: 2015-07-21 — End: 2015-07-22

## 2015-07-21 MED ORDER — SODIUM CHLORIDE 0.9 % IV SOLN: 100.0000 mL | INTRAVENOUS | Status: DC | PRN

## 2015-07-21 MED ORDER — ALTEPLASE 2 MG IJ SOLR: 2.0000 mg | Freq: Once | INTRAMUSCULAR | Status: DC | PRN

## 2015-07-21 MED ORDER — CALCITRIOL 0.5 MCG PO CAPS: 1.7500 ug | ORAL_CAPSULE | Freq: Once | ORAL | Status: DC

## 2015-07-21 MED ORDER — LIDOCAINE HCL (PF) 1 % IJ SOLN: 5.0000 mL | INTRAMUSCULAR | Status: DC | PRN

## 2015-07-21 MED ORDER — HEPARIN SODIUM (PORCINE) 1000 UNIT/ML DIALYSIS: 20.0000 [IU]/kg | INTRAMUSCULAR | Status: DC | PRN

## 2015-07-21 MED ORDER — PENTAFLUOROPROP-TETRAFLUOROETH EX AERO: 1.0000 "application " | INHALATION_SPRAY | CUTANEOUS | Status: DC | PRN

## 2015-07-21 NOTE — Progress Notes (Signed)
PTAR present at bedside.  Report given to [place], EMT.  Per PTAR, unable to transport patient's personal wheelchair to [facility].  This RN phoned [employer] and spoke to [place], Therapist, sports.  Arrangements made for [facility] to pickup [facility] at HD unit on Monday, 7/17.  This RN to label chair with patient information and leave note at HD desk explaining situation.

## 2015-07-21 NOTE — ED Notes (Signed)
Phlebotomy notified unable to obtain lab draw.

## 2015-07-21 NOTE — ED Notes (Signed)
Called pt for triage, pt not in lobby at this time.

## 2015-07-21 NOTE — Progress Notes (Signed)
Pt discharging to [facility] home post treatment.  Report given to PTAR, non emergency transport service.    Venous chamber clotted 2 hours into treatment, requiring relining of machine.  UF goal of 2.5 L and net fluid removal of 1.5L.  Pt stable post treatment.  Currently awaiting PTAR pick up on unit.  Will continue to monitor.

## 2015-07-21 NOTE — ED Provider Notes (Signed)
CSN: [id number]     Arrival date & time 07/21/15  1231 History   First MD Initiated Contact with Patient 07/21/15 1419     Chief Complaint  Patient presents with  . Vascular Access Problem     (Consider location/radiation/quality/duration/timing/severity/associated sxs/prior Treatment) HPI Comments: Patient with history of end-stage renal disease and HIV here requesting routine dialysis. 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 She was discharged from the hospital yesterday on Valtrex and prednisone and states the ear is improving though she continues to still have drainage from the ear. Denies fever. She also has a persistent left facial droop since the ear issue has been going on but this is improving. She denies any chest pain or shortness of breath. She denies any fever. She denies any headache. She states the ear is sore but not causing her any significant problems.  The history is provided by the patient.    Past Medical History  Diagnosis Date  . Human immunodeficiency virus (HIV) disease ([place])   . Hypertension   . Clostridium difficile infection 04/04/10    07/2011 and 08/2011  . Bacteriuria, asymptomatic 04/04/10    Culture grew VRE sensitive to linesolid   . MGUS (monoclonal gammopathy of unknown significance)   . History of bacteremia     MSSA; 05/2011, 08/2011  . Anemia   . Systolic heart failure ([place])     08/27/11 EF 35-40%  . Cancer ([place])   . ESRD (end stage renal disease) ([place]) 03/19/2007    Started HD in 2010.  ESRD was due to HIV per pt. Gets HD on [employer] at [facility] on TTS schedule.     . Peripheral vascular disease ([place])   . Endocarditis     MSSA  . 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 Septicemia due to enterococcus [facility])    Past Surgical History  Procedure Laterality Date  . Av fistula placement    .  Insertion of dialysis catheter  12/30/2010    Procedure: INSERTION OF DIALYSIS CATHETER;  Surgeon: Elam Dutch, MD;  Location: [place];  Service: Vascular;  Laterality: Left;  Insertion of left internal jugular dialysis catheter  . Vascular surgery    . Insertion of dialysis catheter  05/26/2011    Procedure: INSERTION OF DIALYSIS CATHETER;  Surgeon: Conrad [place], MD;  Location: [place];  Service: Vascular;  Laterality: N/A;  Insertion tunneled dialysis catheter in Right Internal Jugular with 23 cm catheter   . Insertion of dialysis catheter  08/26/2011    Procedure: INSERTION OF DIALYSIS CATHETER;  Surgeon: Elam Dutch, MD;  Location: [facility] OR;  Service: Vascular;  Laterality: Right;  insertion of Right Femoral vein Dialysis catheter  . Tee without cardioversion  08/27/2011    Procedure: TRANSESOPHAGEAL ECHOCARDIOGRAM (TEE);  Surgeon: Pixie Casino, MD;  Location: [place];  Service: Cardiovascular;  Laterality: N/A;  . Av fistula placement  09/01/2011    Procedure: INSERTION OF ARTERIOVENOUS (AV) GORE-TEX GRAFT ARM;  Surgeon: Rosetta Posner, MD;  Location: [facility] OR;  Service: Vascular;  Laterality: Right;  Using 95mm x 50  stretch goretex graft  . Thrombectomy and revision of arterioventous (av) goretex  graft Right 02/13/2012    Procedure: THROMBECTOMY AND REVISION OF ARTERIOVENTOUS (AV) GORETEX  GRAFT;  Surgeon: Angelia Mould, MD;  Location: [facility] OR;  Service: Vascular;  Laterality: Right;  . Tee without cardioversion N/A 02/16/2012    Procedure: TRANSESOPHAGEAL ECHOCARDIOGRAM (TEE);  Surgeon: Fay Records, MD;  Location: [place];  Service: Cardiovascular;  Laterality: N/A;  Rm 6710  . Avgg removal Right 03/29/2012    Procedure: REMOVAL OF ARTERIOVENOUS GORETEX GRAFT ([place]);  Surgeon: Rosetta Posner, MD;  Location: [place];  Service: Vascular;  Laterality: Right;  . Insertion of dialysis catheter N/A 03/29/2012    Procedure: INSERTION OF DIALYSIS CATHETER;  Surgeon: Rosetta Posner, MD;   Location: [place];  Service: Vascular;  Laterality: N/A;  Ultrasound guided  . Av fistula placement Left 05/03/2012    Procedure: INSERTION OF ARTERIOVENOUS (AV) GORE-TEX GRAFT THIGH USING 18mm X 70cm Gore-Tex Graft ;  Surgeon: Rosetta Posner, MD;  Location: [facility] OR;  Service: Vascular;  Laterality: Left;  . Transthoracic echocardiogram  08/27/2011    EF 35-40%, mild-moderate concetric hypertrophy of the LV, moderately reduced LV systolic function,  . Abdominal aortagram N/A 12/23/2010    Procedure: ABDOMINAL Maxcine Ham;  Surgeon: Angelia Mould, MD;  Location: [facility] CATH LAB;  Service: Cardiovascular;  Laterality: N/A;  . Lower extremity angiogram  12/23/2010    Procedure: LOWER EXTREMITY ANGIOGRAM;  Surgeon: Angelia Mould, MD;  Location: [facility] CATH LAB;  Service: Cardiovascular;;   Family History  Problem Relation Age of Onset  . Alcohol abuse      family h/o addiction/alcoholism  . Diabetes Cousin     first degree relatives  . Kidney disease Mother   . Kidney disease Maternal Uncle   . Cancer Sister   . Heart disease Father    Social History  Substance Use Topics  . 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 Smokeless tobacco: Never Used     Comment: quit when she was diagnosed with HIV  . Alcohol Use: No   OB History    No data available     Review of Systems  Constitutional: Negative for fever, activity change, appetite change and fatigue.  HENT: Positive for ear discharge and ear pain.   Eyes: Negative for visual disturbance.  Respiratory: Negative for cough, chest tightness and shortness of breath.   Cardiovascular: Negative for chest pain and leg swelling.  Gastrointestinal: Negative for nausea, vomiting and abdominal pain.  Genitourinary: Negative for dysuria, hematuria, vaginal bleeding and vaginal discharge.  Musculoskeletal: Negative for myalgias and arthralgias.  Skin: Negative for rash.  Neurological: Positive for facial asymmetry.  A complete 10 system review of systems  was obtained and all systems are negative except as noted in the HPI and PMH.      Allergies  Penicillins  Home Medications   Prior to Admission medications   Medication Sig Start Date End Date Taking? Authorizing Provider  acetaminophen (TYLENOL) 325 MG tablet Take 650 mg by mouth every 4 (four) hours as needed for mild pain.    Historical Provider, MD  aspirin 81 MG chewable tablet Chew 81 mg by mouth daily at 12 noon.     Historical Provider, MD  cinacalcet (SENSIPAR) 30 MG tablet Take 30 mg by mouth every morning.    Historical Provider, MD  Darunavir Ethanolate (PREZISTA) 800 MG tablet Take 1 tablet (800 mg total) by mouth daily with breakfast. 09/02/11   Nino Glow McLean-Scocozza, MD  docusate sodium (COLACE) 100 MG capsule Take 1 capsule (100 mg total) by mouth 2 (two) times daily. 07/20/15   Hosie Poisson, MD  lamiVUDine ([place]) 10  MG/ML solution Take 50 mg by mouth daily at 12 noon.    Historical Provider, MD  midodrine (PROAMATINE) 10 MG tablet Take 10 mg by mouth 2 (two) times daily. At 630am and 10pm    Historical Provider, MD  Multiple Vitamins-Minerals (CERTAGEN PO) Take 1 tablet by mouth daily at 12 noon.    Historical Provider, MD  Nutritional Supplements (FEEDING SUPPLEMENT, NEPRO CARB STEADY,) LIQD Take 237 mLs by mouth 3 (three) times daily as needed (Supplement). 07/20/15   Hosie Poisson, MD  predniSONE (DELTASONE) 20 MG tablet Take 3 tablets (60 mg total) by mouth daily with breakfast. 07/20/15   Hosie Poisson, MD  raltegravir (ISENTRESS) 400 MG tablet Take 1 tablet (400 mg total) by mouth 2 (two) times daily. 04/15/13   Truman Hayward, MD  ritonavir (NORVIR) 100 MG TABS Take 1 tablet (100 mg total) by mouth daily with breakfast. 09/02/11   Nino Glow McLean-Scocozza, MD  sevelamer carbonate (RENVELA) 800 MG tablet Take 1,600 mg by mouth 3 (three) times daily with meals.    Historical Provider, MD  tenofovir (VIREAD) 300 MG tablet Take 300 mg by mouth once a week. On Fridays     Historical Provider, MD  valACYclovir (VALTREX) 500 MG tablet Take 1 tablet (500 mg total) by mouth at bedtime. 07/21/15   Hosie Poisson, MD   BP 144/86 mmHg  Pulse 84  Temp(Src) 98.2 F (36.8 C) (Oral)  Resp 16  SpO2 94% Physical Exam  Constitutional: She is oriented to person, place, and time. She appears well-developed and well-nourished. No distress.  HENT:  Head: Normocephalic and atraumatic.  Right Ear: External ear normal.  Mouth/Throat: Oropharynx is clear and moist. No oropharyngeal exudate.  The left ear is swollen and erythematous. There is purulent discharge from the left ear canal with scattered lesions overlying the pinna. Patient has slight left-sided facial droop and asymmetric smile   Eyes: Conjunctivae and EOM are normal. Pupils are equal, round, and reactive to light.  Neck: Normal range of motion. Neck supple.  No meningismus.  Cardiovascular: Normal rate, regular rhythm and intact distal pulses.   Murmur heard. Systolic murmur has been documented previously  Pulmonary/Chest: Effort normal and breath sounds normal. No respiratory distress.  Abdominal: Soft. There is no tenderness. There is no rebound and no guarding.  Musculoskeletal: Normal range of motion. She exhibits no edema or tenderness.  Dialysis graft LLE with thrill  Neurological: She is alert and oriented to person, place, and time. A cranial nerve deficit is present. She exhibits normal muscle tone. Coordination normal.  No ataxia on finger to nose bilaterally. No pronator drift. 5/5 strength throughout. Slight L facial droop.Equal grip strength. Sensation intact.   Skin: Skin is warm.  Psychiatric: She has a normal mood and affect. Her behavior is normal.  Nursing note and vitals reviewed.   ED Course  Procedures (including critical care time) Labs Review Labs Reviewed  CBC WITH DIFFERENTIAL/PLATELET - Abnormal; Notable for the following:    RBC 2.72 (*)    Hemoglobin 9.6 (*)    HCT 29.4 (*)     MCV 108.1 (*)    MCH 35.3 (*)    All other components within normal limits  BASIC METABOLIC PANEL - Abnormal; Notable for the following:    Sodium 133 (*)    Chloride 95 (*)    Glucose, Bld 123 (*)    BUN 41 (*)    Creatinine, Ser 7.21 (*)    Calcium 8.2 (*)  GFR calc non Af Amer 6 (*)    GFR calc Af Amer 6 (*)    All other components within normal limits  CULTURE, BLOOD (ROUTINE X 2)  CULTURE, BLOOD (ROUTINE X 2)    Imaging Review No results found. I have personally reviewed and evaluated these images and lab results as part of my medical decision-making.   EKG Interpretation None      MDM   Final diagnoses:  None   Patient requesting routine dialysis. 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 Denies any CP  Or SOB. Denies headache or fever.  States her facial droop is improving.  ID did not think she needed imaging while hospitalized.  EKG unchanged.  K normal.  No respiratory distress  D/w Dr. Ambrose Pancoast who will arrange for dialysis today.  He states patient should be able to go to dialysis center by Tuesday.  D/w Dr. Tommy Medal of ID.  Agrees with dialysis.  No fever or headache.  Does not feel imaging of head indicated at this time.  Patient did not have any imaging of head during admission.  Plan admission for dialysis.  Ezequiel Essex, MD 07/21/15 [phone]

## 2015-07-21 NOTE — Progress Notes (Signed)
Pt with ESRD, HIV, herpes zoster otics discharged 7/14 on prednisone and valtrex.  She was told at her HD unit [place] today to come here due to zoster.  [facility] policy states that they can dialyze pts with active zoster as long as it is not disseminated (i.e in more that one dermatone).  It is only in her ear and is covered.  Due to the lateness of the day, she will be dialyzed at [facility] and then discharged back to her home unit. She has been leaving below her EDW and has slight LE edema along with chronic lymphedema, so have lowered her EDW to 98.5

## 2015-07-21 NOTE — ED Notes (Signed)
Pt here needing dialysis. She has shingles in her left ear and her usual dialysis facility will not dialyze her due to that. She is a T/Th/S dialysis pt and was last dialyzed on Thursday. Respirations e/u, no other complaints.

## 2015-07-26 LAB — CULTURE, BLOOD (ROUTINE X 2)
Culture: NO GROWTH
Culture: NO GROWTH

## 2015-07-30 ENCOUNTER — Ambulatory Visit: Payer: Medicaid Other | Admitting: Internal Medicine

## 2015-08-01 ENCOUNTER — Ambulatory Visit: Payer: Medicaid Other | Admitting: Internal Medicine

## 2015-08-07 ENCOUNTER — Ambulatory Visit (INDEPENDENT_AMBULATORY_CARE_PROVIDER_SITE_OTHER): Payer: Medicaid Other | Admitting: Internal Medicine

## 2015-08-07 VITALS — BP 127/80 | HR 87 | Temp 97.6°F

## 2015-08-07 DIAGNOSIS — B20 Human immunodeficiency virus [HIV] disease: Secondary | ICD-10-CM

## 2015-08-07 LAB — CBC WITH DIFFERENTIAL/PLATELET
Basophils Absolute: 0 cells/uL (ref 0–200)
Basophils Relative: 0 %
Eosinophils Absolute: 42 cells/uL (ref 15–500)
Eosinophils Relative: 1 %
HCT: 30 % — ABNORMAL LOW (ref 35.0–45.0)
Hemoglobin: 9.7 g/dL — ABNORMAL LOW (ref 11.7–15.5)
Lymphocytes Relative: 28 %
Lymphs Abs: 1176 cells/uL (ref 850–3900)
MCH: 36.2 pg — ABNORMAL HIGH (ref 27.0–33.0)
MCHC: 32.3 g/dL (ref 32.0–36.0)
MCV: 111.9 fL — ABNORMAL HIGH (ref 80.0–100.0)
MPV: 8.9 fL (ref 7.5–12.5)
Monocytes Absolute: 420 cells/uL (ref 200–950)
Monocytes Relative: 10 %
Neutro Abs: 2562 cells/uL (ref 1500–7800)
Neutrophils Relative %: 61 %
Platelets: 268 10*3/uL (ref 140–400)
RBC: 2.68 MIL/uL — ABNORMAL LOW (ref 3.80–5.10)
RDW: 16.9 % — ABNORMAL HIGH (ref 11.0–15.0)
WBC: 4.2 10*3/uL (ref 3.8–10.8)

## 2015-08-07 LAB — COMPLETE METABOLIC PANEL WITH GFR
ALT: 9 U/L (ref 6–29)
AST: 24 U/L (ref 10–35)
Albumin: 3.8 g/dL (ref 3.6–5.1)
Alkaline Phosphatase: 203 U/L — ABNORMAL HIGH (ref 33–130)
BUN: 15 mg/dL (ref 7–25)
CO2: 30 mmol/L (ref 20–31)
Calcium: 8.1 mg/dL — ABNORMAL LOW (ref 8.6–10.4)
Chloride: 94 mmol/L — ABNORMAL LOW (ref 98–110)
Creat: 4.63 mg/dL — ABNORMAL HIGH (ref 0.50–1.05)
GFR, Est African American: 11 mL/min — ABNORMAL LOW (ref >=60)
GFR, Est Non African American: 10 mL/min — ABNORMAL LOW (ref >=60)
Glucose, Bld: 84 mg/dL (ref 65–99)
Potassium: 4.3 mmol/L (ref 3.5–5.3)
Sodium: 137 mmol/L (ref 135–146)
Total Bilirubin: 0.6 mg/dL (ref 0.2–1.2)
Total Protein: 7.7 g/dL (ref 6.1–8.1)

## 2015-08-07 LAB — LIPID PANEL
Cholesterol: 213 mg/dL — ABNORMAL HIGH (ref 125–200)
HDL: 92 mg/dL (ref >=46)
LDL Cholesterol: 105 mg/dL (ref <130)
Total CHOL/HDL Ratio: 2.3 Ratio (ref <=5.0)
Triglycerides: 80 mg/dL (ref <150)
VLDL: 16 mg/dL (ref <30)

## 2015-08-07 NOTE — Progress Notes (Signed)
RFV: hiv maintenance  Patient ID: [name], female   DOB: 09/27/1955, 60 y.o.   MRN: [id number]  HPI [name] is a 60yo F with advanced HIV disease, HIV related dementia, ESRD on HD, SNF resident, here for annual visit. Last seen in July 2016. Last CD 4 count of 300/VL160 on RLG bid, DRV/r, TDF weekly, lamivudine 50mg  daily. Roughly 2 weeks ago started to have pain to left side of face thought she had tooth ache but then also had left facial droop and lesions in left ear canal. She was diagnosed with shingles c/b ramsey-hunt syndrome. She was seen by Dr. Megan Salon on admission, given valtrex and prednisone during hosp 7/13-7/15. She has completed steroids and valtrex. Still has residual neuro deficits.   Outpatient Encounter Prescriptions as of 08/07/2015  Medication Sig  . acetaminophen (TYLENOL) 325 MG tablet Take 650 mg by mouth every 4 (four) hours as needed for mild pain.  [name] albuterol (PROVENTIL HFA;VENTOLIN HFA) 108 (90 Base) MCG/ACT inhaler Inhale 2 puffs into the lungs every 6 (six) hours as needed for wheezing or shortness of breath.  [name] aspirin 81 MG chewable tablet Chew 81 mg by mouth daily at 12 noon.   . cinacalcet (SENSIPAR) 30 MG tablet Take 30 mg by mouth daily at 12 noon.   . ciprofloxacin (CIPRO) 500 MG tablet Take 500 mg by mouth 2 (two) times daily. Reported on 07/21/2015  . ciprofloxacin-dexamethasone (CIPRODEX) otic suspension Place 4 drops into the left ear 2 (two) times daily. For 10 days Started 07/21/15  . Darunavir Ethanolate (PREZISTA) 800 MG tablet Take 1 tablet (800 mg total) by mouth daily with breakfast.  . docusate sodium (COLACE) 100 MG capsule Take 1 capsule (100 mg total) by mouth 2 (two) times daily.  [name] lamiVUDine (EPIVIR) 10 MG/ML solution Take 50 mg by mouth daily at 12 noon.  . loratadine (CLARITIN) 10 MG tablet Take 10 mg by mouth daily as needed for allergies.  . midodrine (PROAMATINE) 10 MG tablet Take 10 mg by mouth 2 (two) times daily. At 630am and  10pm  . Multiple Vitamins-Minerals (CERTAGEN PO) Take 1 tablet by mouth daily at 12 noon.  . Nutritional Supplements (FEEDING SUPPLEMENT, NEPRO CARB STEADY,) LIQD Take 237 mLs by mouth 3 (three) times daily as needed (Supplement).  . predniSONE (DELTASONE) 20 MG tablet Take 3 tablets (60 mg total) by mouth daily with breakfast.  . raltegravir (ISENTRESS) 400 MG tablet Take 1 tablet (400 mg total) by mouth 2 (two) times daily.  . ritonavir (NORVIR) 100 MG TABS Take 1 tablet (100 mg total) by mouth daily with breakfast.  . sevelamer carbonate (RENVELA) 800 MG tablet Take 2,400 mg by mouth 3 (three) times daily with meals.   [name] tenofovir (VIREAD) 300 MG tablet Take 300 mg by mouth once a week. On Fridays  . valACYclovir (VALTREX) 500 MG tablet Take 1 tablet (500 mg total) by mouth at bedtime.   No facility-administered encounter medications on file as of 08/07/2015.      Patient Active Problem List   Diagnosis Date Noted  . ESRD needing dialysis ([place]) 07/21/2015  . Hyponatremia 07/20/2015  . Hypokalemia 07/20/2015  . 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 HIV dementia ([place]) 04/12/2014  . Chronic respiratory failure with hypoxia ([place]) 03/27/2014  . Obesity (BMI 30-39.9) 03/24/2014  . Hypotension, intermittent esp after HD 08/22/2011  . LEG EDEMA, CHRONIC 04/15/2007  . Human immunodeficiency virus (HIV) disease ([place]) 03/19/2007  . ANEMIA OF CHRONIC DISEASE  03/19/2007  . ESRD (end stage renal disease) ([place]) 03/19/2007     Health Maintenance Due  Topic Date Due  . COLONOSCOPY  09/16/2006  . INFLUENZA VACCINE  08/07/2015     Review of Systems No complaints, still has healing skin lesion to her ear. Otherwise 10 point ros is negative Physical Exam  BP 127/80   Pulse 87   Temp 97.6 F (36.4 C) (Oral)  HEENT = left ear pinna denuded, swelling to internal canal. No tenderness no further vesicles pulm = CTAB, no c/r/r Cors = nl S1,S2 ,no g/m/r Abd= soft, ntnd Ext = no c/c/e  Lab Results    Component Value Date   CD4TCELL 19 (L) 07/12/2014   Lab Results  Component Value Date   CD4TABS 300 (L) 07/12/2014   CD4TABS 350 (L) 04/12/2014   CD4TABS 260 (L) 03/23/2014   Lab Results  Component Value Date   HIV1RNAQUANT 160 07/19/2015   Lab Results  Component Value Date   HEPBSAB NEGATIVE 10/23/2007   No results found for: RPR  CBC Lab Results  Component Value Date   WBC 8.4 07/21/2015   RBC 2.72 (L) 07/21/2015   HGB 9.6 (L) 07/21/2015   HCT 29.4 (L) 07/21/2015   PLT 328 07/21/2015   MCV 108.1 (H) 07/21/2015   MCH 35.3 (H) 07/21/2015   MCHC 32.7 07/21/2015   RDW 12.9 07/21/2015   LYMPHSABS 1.4 07/21/2015   MONOABS 0.1 07/21/2015   EOSABS 0.0 07/21/2015   BASOSABS 0.0 07/21/2015   BMET Lab Results  Component Value Date   NA 133 (L) 07/21/2015   K 4.5 07/21/2015   CL 95 (L) 07/21/2015   CO2 26 07/21/2015   GLUCOSE 123 (H) 07/21/2015   BUN 41 (H) 07/21/2015   CREATININE 7.21 (H) 07/21/2015   CALCIUM 8.2 (L) 07/21/2015   GFRNONAA 6 (L) 07/21/2015   GFRAA 6 (L) 07/21/2015     Assessment and Plan  HIV disease = Will repeat labs for cd 4 count and viral load  Continue on current regimen.  See her back in 3 months, sooner if VL is detectable.  She is resident of [facility]  Ramsey-hunt = appears resolving  Health maintenance =to get flu shot in the fall

## 2015-08-08 LAB — T-HELPER CELL (CD4) - (RCID CLINIC ONLY)
CD4 % Helper T Cell: 24 % — ABNORMAL LOW (ref 33–55)
CD4 T Cell Abs: 300 /uL — ABNORMAL LOW (ref 400–2700)

## 2015-08-08 LAB — RPR

## 2015-08-09 LAB — HIV-1 RNA QUANT-NO REFLEX-BLD
HIV 1 RNA Quant: 71 copies/mL — ABNORMAL HIGH (ref <20)
HIV-1 RNA Quant, Log: 1.85 Log copies/mL — ABNORMAL HIGH (ref <1.30)

## 2015-08-28 ENCOUNTER — Ambulatory Visit: Payer: Medicaid Other | Admitting: Internal Medicine

## 2015-10-01 ENCOUNTER — Other Ambulatory Visit (HOSPITAL_COMMUNITY): Payer: Self-pay | Admitting: Physician Assistant

## 2015-10-01 DIAGNOSIS — R29810 Facial weakness: Secondary | ICD-10-CM

## 2015-10-01 DIAGNOSIS — S00412A Abrasion of left ear, initial encounter: Secondary | ICD-10-CM

## 2015-10-04 ENCOUNTER — Ambulatory Visit (HOSPITAL_COMMUNITY): Admission: RE | Admit: 2015-10-04 | Payer: Medicaid Other | Source: Ambulatory Visit

## 2015-10-12 ENCOUNTER — Ambulatory Visit (HOSPITAL_COMMUNITY): Payer: Medicaid Other

## 2015-10-15 ENCOUNTER — Ambulatory Visit (INDEPENDENT_AMBULATORY_CARE_PROVIDER_SITE_OTHER): Payer: Medicaid Other | Admitting: Internal Medicine

## 2015-10-15 DIAGNOSIS — N186 End stage renal disease: Secondary | ICD-10-CM

## 2015-10-24 NOTE — Progress Notes (Signed)
No showed. Accidentally opened encounter for visit

## 2016-06-19 ENCOUNTER — Ambulatory Visit (INDEPENDENT_AMBULATORY_CARE_PROVIDER_SITE_OTHER): Payer: Medicaid Other | Admitting: Internal Medicine

## 2016-06-19 ENCOUNTER — Encounter: Payer: Self-pay | Admitting: Internal Medicine

## 2016-06-19 VITALS — BP 124/81 | HR 82 | Temp 98.8°F

## 2016-06-19 DIAGNOSIS — F0280 Dementia in other diseases classified elsewhere without behavioral disturbance: Secondary | ICD-10-CM | POA: Diagnosis not present

## 2016-06-19 DIAGNOSIS — B20 Human immunodeficiency virus [HIV] disease: Secondary | ICD-10-CM | POA: Diagnosis not present

## 2016-06-19 DIAGNOSIS — N186 End stage renal disease: Secondary | ICD-10-CM | POA: Diagnosis not present

## 2016-06-19 LAB — CBC WITH DIFFERENTIAL/PLATELET
Basophils Absolute: 33 cells/uL (ref 0–200)
Basophils Relative: 1 %
Eosinophils Absolute: 99 cells/uL (ref 15–500)
Eosinophils Relative: 3 %
HCT: 32.9 % — ABNORMAL LOW (ref 35.0–45.0)
Hemoglobin: 10.6 g/dL — ABNORMAL LOW (ref 11.7–15.5)
Lymphocytes Relative: 47 %
Lymphs Abs: 1551 cells/uL (ref 850–3900)
MCH: 34.1 pg — ABNORMAL HIGH (ref 27.0–33.0)
MCHC: 32.2 g/dL (ref 32.0–36.0)
MCV: 105.8 fL — ABNORMAL HIGH (ref 80.0–100.0)
MPV: 9.1 fL (ref 7.5–12.5)
Monocytes Absolute: 231 cells/uL (ref 200–950)
Monocytes Relative: 7 %
Neutro Abs: 1386 cells/uL — ABNORMAL LOW (ref 1500–7800)
Neutrophils Relative %: 42 %
Platelets: 212 10*3/uL (ref 140–400)
RBC: 3.11 MIL/uL — ABNORMAL LOW (ref 3.80–5.10)
RDW: 14.3 % (ref 11.0–15.0)
WBC: 3.3 10*3/uL — ABNORMAL LOW (ref 3.8–10.8)

## 2016-06-19 NOTE — Progress Notes (Signed)
RFV: follow up for hiv disease  Patient ID: [name], female   DOB: [birth year], 61 y.o.   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 Last seen 10 months ago. sNF resident. Doing well. She continues to getHD tue-thu-sat. No complaints with her health. No recent admission Outpatient Encounter Prescriptions as of 06/19/2016  Medication Sig  . acetaminophen (TYLENOL) 325 MG tablet Take 650 mg by mouth every 4 (four) hours as needed for mild pain.  [name] albuterol (PROVENTIL HFA;VENTOLIN HFA) 108 (90 Base) MCG/ACT inhaler Inhale 2 puffs into the lungs every 6 (six) hours as needed for wheezing or shortness of breath.  [name] aspirin 81 MG chewable tablet Chew 81 mg by mouth daily at 12 noon.   . cinacalcet (SENSIPAR) 30 MG tablet Take 30 mg by mouth daily at 12 noon.   . ciprofloxacin (CIPRO) 500 MG tablet Take 500 mg by mouth 2 (two) times daily. Reported on 07/21/2015  . ciprofloxacin-dexamethasone (CIPRODEX) otic suspension Place 4 drops into the left ear 2 (two) times daily. For 10 days Started 07/21/15  . Darunavir Ethanolate (PREZISTA) 800 MG tablet Take 1 tablet (800 mg total) by mouth daily with breakfast.  . docusate sodium (COLACE) 100 MG capsule Take 1 capsule (100 mg total) by mouth 2 (two) times daily.  [name] lamiVUDine (EPIVIR) 10 MG/ML solution Take 50 mg by mouth daily at 12 noon.  . loratadine (CLARITIN) 10 MG tablet Take 10 mg by mouth daily as needed for allergies.  . midodrine (PROAMATINE) 10 MG tablet Take 10 mg by mouth 2 (two) times daily. At 630am and 10pm  . Multiple Vitamins-Minerals (CERTAGEN PO) Take 1 tablet by mouth daily at 12 noon.  . Nutritional Supplements (FEEDING SUPPLEMENT, NEPRO CARB STEADY,) LIQD Take 237 mLs by mouth 3 (three) times daily as needed (Supplement).  . predniSONE (DELTASONE) 20 MG tablet Take 3 tablets (60 mg total) by mouth daily with breakfast.  . raltegravir (ISENTRESS) 400 MG tablet Take 1  tablet (400 mg total) by mouth 2 (two) times daily.  . ritonavir (NORVIR) 100 MG TABS Take 1 tablet (100 mg total) by mouth daily with breakfast.  . sevelamer carbonate (RENVELA) 800 MG tablet Take 2,400 mg by mouth 3 (three) times daily with meals.   [name] tenofovir (VIREAD) 300 MG tablet Take 300 mg by mouth once a week. On Fridays  . valACYclovir (VALTREX) 500 MG tablet Take 1 tablet (500 mg total) by mouth at bedtime.   No facility-administered encounter medications on file as of 06/19/2016.      Patient Active Problem List   Diagnosis Date Noted  . ESRD needing dialysis ([place]) 07/21/2015  . Hyponatremia 07/20/2015  . Hypokalemia 07/20/2015  . 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 Chronic respiratory failure with hypoxia ([place]) 03/27/2014  . Obesity (BMI 30-39.9) 03/24/2014  . Hypotension, intermittent esp after HD 08/22/2011  . LEG EDEMA, CHRONIC 04/15/2007  . Human immunodeficiency virus (HIV) disease ([place]) 03/19/2007  . ANEMIA OF CHRONIC DISEASE 03/19/2007  . ESRD (end stage renal disease) ([place]) 03/19/2007     Health Maintenance Due  Topic Date Due  . COLONOSCOPY  09/16/2006    Social History  Substance Use Topics  . 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 Smokeless tobacco: Never Used     Comment: quit when she was diagnosed with HIV  . Alcohol use No  Review of Systems Review of Systems  Constitutional: Negative for fever, chills, diaphoresis, activity change, appetite change, fatigue and unexpected weight change.  HENT: Negative for congestion, sore throat, rhinorrhea, sneezing, trouble swallowing and sinus pressure.  Eyes: Negative for photophobia and visual disturbance.  Respiratory: Negative for cough, chest tightness, shortness of breath, wheezing and stridor.  Cardiovascular: Negative for chest pain, palpitations and leg swelling.  Gastrointestinal: Negative for nausea, vomiting, abdominal pain, diarrhea, constipation, blood in stool,  abdominal distention and anal bleeding.  Genitourinary: Negative for dysuria, hematuria, flank pain and difficulty urinating.  Musculoskeletal: Negative for myalgias, back pain, joint swelling, arthralgias and gait problem.  Skin: Negative for color change, pallor, rash and wound.  Neurological: Negative for dizziness, tremors, weakness and light-headedness.  Hematological: Negative for adenopathy. Does not bruise/bleed easily.  Psychiatric/Behavioral:+ confusion due to underlying dementia. Negative for behavioral problems, confusion, sleep disturbance, dysphoric mood, decreased concentration and agitation.    Physical Exam   BP 124/81   Pulse 82   Temp 98.8 F (37.1 C) (Oral)   Physical Exam  Constitutional:  oriented to person, place, and time. appears well-developed and well-nourished. No distress.  HENT: [place]/AT, PERRLA, no scleral icterus Mouth/Throat: Oropharynx is clear and moist. No oropharyngeal exudate.  Cardiovascular: Normal rate, regular rhythm and normal heart sounds. 3/6 SEM Pulmonary/Chest: Effort normal and breath sounds normal. No respiratory distress.  has no wheezes.  Neck = supple, no nuchal rigidity Abdominal: Soft. Bowel sounds are normal.  exhibits no distension. There is no tenderness.  Lymphadenopathy: no cervical adenopathy. No axillary adenopathy Neurological: alert and oriented to person, place, and time.  Skin: Skin is warm and dry. No rash noted. No erythema.  Psychiatric: a normal mood and affect.  behavior is normal.    Lab Results  Component Value Date   CD4TCELL 24 (L) 08/07/2015   Lab Results  Component Value Date   CD4TABS 300 (L) 08/07/2015   CD4TABS 300 (L) 07/12/2014   CD4TABS 350 (L) 04/12/2014   Lab Results  Component Value Date   HIV1RNAQUANT 71 (H) 08/07/2015   Lab Results  Component Value Date   HEPBSAB NEGATIVE 10/23/2007   Lab Results  Component Value Date   LABRPR NON REAC 08/07/2015    CBC Lab Results  Component  Value Date   WBC 4.2 08/07/2015   RBC 2.68 (L) 08/07/2015   HGB 9.7 (L) 08/07/2015   HCT 30.0 (L) 08/07/2015   PLT 268 08/07/2015   MCV 111.9 (H) 08/07/2015   MCH 36.2 (H) 08/07/2015   MCHC 32.3 08/07/2015   RDW 16.9 (H) 08/07/2015   LYMPHSABS 1,176 08/07/2015   MONOABS 420 08/07/2015   EOSABS 42 08/07/2015    BMET Lab Results  Component Value Date   NA 137 08/07/2015   K 4.3 08/07/2015   CL 94 (L) 08/07/2015   CO2 30 08/07/2015   GLUCOSE 84 08/07/2015   BUN 15 08/07/2015   CREATININE 4.63 (H) 08/07/2015   CALCIUM 8.1 (L) 08/07/2015   GFRNONAA 10 (L) 08/07/2015   GFRAA 11 (L) 08/07/2015      Assessment and Plan   hiv disease = will check annual labs. Will change one aspect of regimen  Start tivicay 50mg  1 tab daily Discontinue isentress  Continue with darunavir 800mg  daily Ritonavir 100mg  daily Lamivudine 50mg  daily tenofovir 300mg  weekly  esrd = gets HD from left femoral line, continue iwht current schedule  hiv dementia = appears stable  Health maintenance =flu vaccine in the Fall  rtc in 3-16months, in anticipation to see if can continue to improve her regimen

## 2016-06-20 LAB — HEPATITIS C ANTIBODY: HCV Ab: NEGATIVE

## 2016-06-20 LAB — RPR

## 2016-06-20 LAB — T-HELPER CELL (CD4) - (RCID CLINIC ONLY)
CD4 % Helper T Cell: 28 % — ABNORMAL LOW (ref 33–55)
CD4 T Cell Abs: 490 /uL (ref 400–2700)

## 2016-06-23 ENCOUNTER — Telehealth: Payer: Self-pay | Admitting: *Deleted

## 2016-06-23 NOTE — Telephone Encounter (Signed)
PK, from patient's facility, calling to see if lab results can be faxed for patient's chart.  All labs not yet resulted.  When complete, RN will fax to [phone]. Landis Gandy, RN

## 2016-06-25 LAB — HIV-1 RNA QUANT-NO REFLEX-BLD
HIV 1 RNA Quant: 60 copies/mL — ABNORMAL HIGH
HIV-1 RNA Quant, Log: 1.78 Log copies/mL — ABNORMAL HIGH

## 2016-06-26 NOTE — Telephone Encounter (Signed)
Lab results are complete.  Will fax to [facility] and [place], [phone].

## 2016-07-29 ENCOUNTER — Other Ambulatory Visit: Payer: Self-pay | Admitting: Internal Medicine

## 2016-07-29 DIAGNOSIS — Z1231 Encounter for screening mammogram for malignant neoplasm of breast: Secondary | ICD-10-CM

## 2016-08-18 ENCOUNTER — Ambulatory Visit
Admission: RE | Admit: 2016-08-18 | Discharge: 2016-08-18 | Disposition: A | Discharge status: Home / Self Care | Payer: Medicaid Other | Source: Ambulatory Visit | Attending: Internal Medicine | Admitting: Internal Medicine

## 2016-08-18 DIAGNOSIS — Z1231 Encounter for screening mammogram for malignant neoplasm of breast: Secondary | ICD-10-CM

## 2016-10-20 ENCOUNTER — Ambulatory Visit (INDEPENDENT_AMBULATORY_CARE_PROVIDER_SITE_OTHER): Payer: Medicaid Other | Admitting: Internal Medicine

## 2016-10-20 ENCOUNTER — Encounter: Payer: Self-pay | Admitting: Internal Medicine

## 2016-10-20 VITALS — BP 132/77 | HR 76 | Temp 97.9°F | Ht 69.0 in | Wt 208.0 lb

## 2016-10-20 DIAGNOSIS — N186 End stage renal disease: Secondary | ICD-10-CM | POA: Diagnosis not present

## 2016-10-20 DIAGNOSIS — B20 Human immunodeficiency virus [HIV] disease: Secondary | ICD-10-CM

## 2016-10-20 DIAGNOSIS — F0280 Dementia in other diseases classified elsewhere without behavioral disturbance: Secondary | ICD-10-CM

## 2016-10-20 MED ORDER — DOLUTEGRAVIR SODIUM 50 MG PO TABS: 50.0000 mg | ORAL_TABLET | Freq: Every day | ORAL | 11 refills | Status: DC

## 2016-10-20 NOTE — Progress Notes (Signed)
RFV: follow up with hiv disease  Patient ID: [name], female   DOB: [birth year], 61 y.o.   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 Comes to clinic for routine check up. She is currently on RLG/TDF weekly/DRVr/lamivudine  She reports not taking her lamividine  But her geno from 2014 shows M184V R to lam, and emtricitabine  Outpatient Encounter Prescriptions as of 10/20/2016  Medication Sig  . acetaminophen (TYLENOL) 325 MG tablet Take 650 mg by mouth every 4 (four) hours as needed for mild pain.  [name] albuterol (PROVENTIL HFA;VENTOLIN HFA) 108 (90 Base) MCG/ACT inhaler Inhale 2 puffs into the lungs every 6 (six) hours as needed for wheezing or shortness of breath.  [name] aspirin 81 MG chewable tablet Chew 81 mg by mouth daily at 12 noon.   . cinacalcet (SENSIPAR) 30 MG tablet Take 30 mg by mouth daily at 12 noon.   . Darunavir Ethanolate (PREZISTA) 800 MG tablet Take 1 tablet (800 mg total) by mouth daily with breakfast.  . docusate sodium (COLACE) 100 MG capsule Take 1 capsule (100 mg total) by mouth 2 (two) times daily.  [name] lamiVUDine (EPIVIR) 10 MG/ML solution Take 50 mg by mouth daily at 12 noon.  . loratadine (CLARITIN) 10 MG tablet Take 10 mg by mouth daily as needed for allergies.  . midodrine (PROAMATINE) 10 MG tablet Take 10 mg by mouth 2 (two) times daily. At 630am and 10pm  . Multiple Vitamins-Minerals (CERTAGEN PO) Take 1 tablet by mouth daily at 12 noon.  . Nutritional Supplements (FEEDING SUPPLEMENT, NEPRO CARB STEADY,) LIQD Take 237 mLs by mouth 3 (three) times daily as needed (Supplement).  . raltegravir (ISENTRESS) 400 MG tablet Take 1 tablet (400 mg total) by mouth 2 (two) times daily.  . ritonavir (NORVIR) 100 MG TABS Take 1 tablet (100 mg total) by mouth daily with breakfast.  . sevelamer carbonate (RENVELA) 800 MG tablet Take 2,400 mg by mouth 3 (three) times daily with meals.   [name] tenofovir (VIREAD) 300 MG tablet  Take 300 mg by mouth once a week. On Fridays  . [DISCONTINUED] valACYclovir (VALTREX) 500 MG tablet Take 1 tablet (500 mg total) by mouth at bedtime.  . [DISCONTINUED] ciprofloxacin (CIPRO) 500 MG tablet Take 500 mg by mouth 2 (two) times daily. Reported on 07/21/2015  . [DISCONTINUED] ciprofloxacin-dexamethasone (CIPRODEX) otic suspension Place 4 drops into the left ear 2 (two) times daily. For 10 days Started 07/21/15  . [DISCONTINUED] predniSONE (DELTASONE) 20 MG tablet Take 3 tablets (60 mg total) by mouth daily with breakfast.   No facility-administered encounter medications on file as of 10/20/2016.      Patient Active Problem List   Diagnosis Date Noted  . ESRD needing dialysis ([place]) 07/21/2015  . Hyponatremia 07/20/2015  . Hypokalemia 07/20/2015  . 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 HIV dementia ([place]) 04/12/2014  . Chronic respiratory failure with hypoxia ([place]) 03/27/2014  . Obesity (BMI 30-39.9) 03/24/2014  . Hypotension, intermittent esp after HD 08/22/2011  . LEG EDEMA, CHRONIC 04/15/2007  . Human immunodeficiency virus (HIV) disease ([place]) 03/19/2007  . ANEMIA OF CHRONIC DISEASE 03/19/2007  . ESRD (end stage renal disease) ([place]) 03/19/2007     Health Maintenance Due  Topic Date Due  . COLONOSCOPY  09/16/2006  . PAP SMEAR  07/29/2016  . INFLUENZA VACCINE  08/06/2016     Review of Systems Review of Systems  Constitutional: Negative for fever,  chills, diaphoresis, activity change, appetite change, fatigue and unexpected weight change.  HENT: Negative for congestion, sore throat, rhinorrhea, sneezing, trouble swallowing and sinus pressure.  Eyes: Negative for photophobia and visual disturbance.  Respiratory: Negative for cough, chest tightness, shortness of breath, wheezing and stridor.  Cardiovascular: Negative for chest pain, palpitations and leg swelling.  Gastrointestinal: Negative for nausea, vomiting, abdominal pain, diarrhea, constipation, blood in stool, abdominal  distention and anal bleeding.  Genitourinary: Negative for dysuria, hematuria, flank pain and difficulty urinating.  Musculoskeletal: Negative for myalgias, back pain, joint swelling, arthralgias and gait problem.  Skin: Negative for color change, pallor, rash and wound.  Neurological: Negative for dizziness, tremors, weakness and light-headedness.  Hematological: Negative for adenopathy. Does not bruise/bleed easily.  Psychiatric/Behavioral: Negative for behavioral problems, confusion, sleep disturbance, dysphoric mood, decreased concentration and agitation.    Physical Exam   BP 132/77   Pulse 76   Temp 97.9 F (36.6 C) (Oral)   Ht 5\' 9"  (1.753 m)   Wt 208 lb (94.3 kg)   BMI 30.72 kg/m   Physical Exam  Constitutional:  oriented to person, place, and time. appears well-developed and well-nourished. No distress.  HENT: [place]/AT, PERRLA, no scleral icterus Mouth/Throat: Oropharynx is clear and moist. No oropharyngeal exudate.  Cardiovascular: Normal rate, regular rhythm and normal heart sounds. Exam reveals no gallop and no friction rub.  No murmur heard.  Pulmonary/Chest: Effort normal and breath sounds normal. No respiratory distress.  has no wheezes.  Neck = supple, no nuchal rigidity Abdominal: Soft. Bowel sounds are normal.  exhibits no distension. There is no tenderness.  Lymphadenopathy: no cervical adenopathy. No axillary adenopathy Neurological: alert and oriented to person, place, and time.  Skin: Skin is warm and dry. No rash noted. No erythema.  Psychiatric: a normal mood and affect.  behavior is normal.   Lab Results  Component Value Date   CD4TCELL 28 (L) 06/19/2016   Lab Results  Component Value Date   CD4TABS 490 06/19/2016   CD4TABS 300 (L) 08/07/2015   CD4TABS 300 (L) 07/12/2014   Lab Results  Component Value Date   HIV1RNAQUANT 60 (H) 06/19/2016   Lab Results  Component Value Date   HEPBSAB NEGATIVE 10/23/2007   Lab Results  Component Value Date    LABRPR NON REAC 06/19/2016    CBC Lab Results  Component Value Date   WBC 3.3 (L) 06/19/2016   RBC 3.11 (L) 06/19/2016   HGB 10.6 (L) 06/19/2016   HCT 32.9 (L) 06/19/2016   PLT 212 06/19/2016   MCV 105.8 (H) 06/19/2016   MCH 34.1 (H) 06/19/2016   MCHC 32.2 06/19/2016   RDW 14.3 06/19/2016   LYMPHSABS 1,551 06/19/2016   MONOABS 231 06/19/2016   EOSABS 99 06/19/2016    BMET Lab Results  Component Value Date   NA 137 08/07/2015   K 4.3 08/07/2015   CL 94 (L) 08/07/2015   CO2 30 08/07/2015   GLUCOSE 84 08/07/2015   BUN 15 08/07/2015   CREATININE 4.63 (H) 08/07/2015   CALCIUM 8.1 (L) 08/07/2015   GFRNONAA 10 (L) 08/07/2015   GFRAA 11 (L) 08/07/2015      Assessment and Plan  hiv disease = Will check labs and genosure-archive  Will place her on tivicay/TDF weekly/DRV/r  Will give rx in hand for tivicay  ckd 5= will check Cr  hiv dementia = currently on skilled facility giving assistance  Will see back in 2 months

## 2016-10-20 NOTE — Patient Instructions (Signed)
Please note that her medicaitons have changed":   1) discontinue isentress 2) will start tivicay 50mg  daily -- this must not be given at the same time as her vitamins 3) continue tenofovir 300mg  weekly 4) continue darunavir 800mg  daily 5) continue ritonavir 100mg  daily

## 2016-10-21 LAB — CBC WITH DIFFERENTIAL/PLATELET
Basophils Absolute: 21 cells/uL (ref 0–200)
Basophils Relative: 0.5 %
Eosinophils Absolute: 62 cells/uL (ref 15–500)
Eosinophils Relative: 1.5 %
HCT: 31.2 % — ABNORMAL LOW (ref 35.0–45.0)
Hemoglobin: 10.8 g/dL — ABNORMAL LOW (ref 11.7–15.5)
Lymphs Abs: 1677 cells/uL (ref 850–3900)
MCH: 36 pg — ABNORMAL HIGH (ref 27.0–33.0)
MCHC: 34.6 g/dL (ref 32.0–36.0)
MCV: 104 fL — ABNORMAL HIGH (ref 80.0–100.0)
MPV: 9.5 fL (ref 7.5–12.5)
Monocytes Relative: 9.6 %
Neutro Abs: 1948 cells/uL (ref 1500–7800)
Neutrophils Relative %: 47.5 %
Platelets: 229 10*3/uL (ref 140–400)
RBC: 3 10*6/uL — ABNORMAL LOW (ref 3.80–5.10)
RDW: 13.3 % (ref 11.0–15.0)
Total Lymphocyte: 40.9 %
WBC mixed population: 394 cells/uL (ref 200–950)
WBC: 4.1 10*3/uL (ref 3.8–10.8)

## 2016-10-21 LAB — COMPLETE METABOLIC PANEL WITH GFR
AG Ratio: 1 (calc) (ref 1.0–2.5)
ALT: 8 U/L (ref 6–29)
AST: 14 U/L (ref 10–35)
Albumin: 4.2 g/dL (ref 3.6–5.1)
Alkaline phosphatase (APISO): 182 U/L — ABNORMAL HIGH (ref 33–130)
BUN/Creatinine Ratio: 6 (calc) (ref 6–22)
BUN: 45 mg/dL — ABNORMAL HIGH (ref 7–25)
CO2: 31 mmol/L (ref 20–32)
Calcium: 9.4 mg/dL (ref 8.6–10.4)
Chloride: 93 mmol/L — ABNORMAL LOW (ref 98–110)
Creat: 7.75 mg/dL — ABNORMAL HIGH (ref 0.50–0.99)
GFR, Est African American: 6 mL/min/{1.73_m2} — ABNORMAL LOW (ref >=60)
GFR, Est Non African American: 5 mL/min/{1.73_m2} — ABNORMAL LOW (ref >=60)
Globulin: 4.2 g/dL (calc) — ABNORMAL HIGH (ref 1.9–3.7)
Glucose, Bld: 77 mg/dL (ref 65–99)
Potassium: 5.4 mmol/L — ABNORMAL HIGH (ref 3.5–5.3)
Sodium: 137 mmol/L (ref 135–146)
Total Bilirubin: 0.6 mg/dL (ref 0.2–1.2)
Total Protein: 8.4 g/dL — ABNORMAL HIGH (ref 6.1–8.1)

## 2016-10-21 LAB — T-HELPER CELL (CD4) - (RCID CLINIC ONLY)
CD4 % Helper T Cell: 24 % — ABNORMAL LOW (ref 33–55)
CD4 T Cell Abs: 450 /uL (ref 400–2700)

## 2016-10-22 LAB — HIV-1 RNA QUANT-NO REFLEX-BLD
HIV 1 RNA Quant: <20 copies/mL
HIV-1 RNA Quant, Log: <1.3 Log copies/mL

## 2016-10-29 ENCOUNTER — Encounter: Payer: Self-pay | Admitting: Internal Medicine

## 2016-12-23 ENCOUNTER — Ambulatory Visit: Payer: Medicaid Other | Admitting: Internal Medicine

## 2017-09-21 ENCOUNTER — Other Ambulatory Visit: Payer: Medicaid Other

## 2017-09-21 ENCOUNTER — Other Ambulatory Visit: Payer: Self-pay | Admitting: Behavioral Health

## 2017-09-21 DIAGNOSIS — Z79899 Other long term (current) drug therapy: Secondary | ICD-10-CM

## 2017-09-21 DIAGNOSIS — Z113 Encounter for screening for infections with a predominantly sexual mode of transmission: Secondary | ICD-10-CM

## 2017-09-21 DIAGNOSIS — B20 Human immunodeficiency virus [HIV] disease: Secondary | ICD-10-CM

## 2017-09-22 LAB — T-HELPER CELL (CD4) - (RCID CLINIC ONLY)
CD4 % Helper T Cell: 20 % — ABNORMAL LOW (ref 33–55)
CD4 T Cell Abs: 210 /uL — ABNORMAL LOW (ref 400–2700)

## 2017-09-23 LAB — COMPREHENSIVE METABOLIC PANEL
AG Ratio: 0.8 (calc) — ABNORMAL LOW (ref 1.0–2.5)
ALT: 7 U/L (ref 6–29)
AST: 17 U/L (ref 10–35)
Albumin: 3.4 g/dL — ABNORMAL LOW (ref 3.6–5.1)
Alkaline phosphatase (APISO): 123 U/L (ref 33–130)
BUN/Creatinine Ratio: 6 (calc) (ref 6–22)
BUN: 44 mg/dL — ABNORMAL HIGH (ref 7–25)
CO2: 30 mmol/L (ref 20–32)
Calcium: 9.4 mg/dL (ref 8.6–10.4)
Chloride: 96 mmol/L — ABNORMAL LOW (ref 98–110)
Creat: 7.23 mg/dL — ABNORMAL HIGH (ref 0.50–0.99)
Globulin: 4.5 g/dL (calc) — ABNORMAL HIGH (ref 1.9–3.7)
Glucose, Bld: 79 mg/dL (ref 65–99)
Potassium: 4.5 mmol/L (ref 3.5–5.3)
Sodium: 138 mmol/L (ref 135–146)
Total Bilirubin: 0.5 mg/dL (ref 0.2–1.2)
Total Protein: 7.9 g/dL (ref 6.1–8.1)

## 2017-09-23 LAB — CBC WITH DIFFERENTIAL/PLATELET
Basophils Absolute: 21 cells/uL (ref 0–200)
Basophils Relative: 0.7 %
Eosinophils Absolute: 51 cells/uL (ref 15–500)
Eosinophils Relative: 1.7 %
HCT: 29.1 % — ABNORMAL LOW (ref 35.0–45.0)
Hemoglobin: 9.8 g/dL — ABNORMAL LOW (ref 11.7–15.5)
Lymphs Abs: 1035 cells/uL (ref 850–3900)
MCH: 34.3 pg — ABNORMAL HIGH (ref 27.0–33.0)
MCHC: 33.7 g/dL (ref 32.0–36.0)
MCV: 101.7 fL — ABNORMAL HIGH (ref 80.0–100.0)
MPV: 10 fL (ref 7.5–12.5)
Monocytes Relative: 11.1 %
Neutro Abs: 1560 cells/uL (ref 1500–7800)
Neutrophils Relative %: 52 %
Platelets: 173 10*3/uL (ref 140–400)
RBC: 2.86 10*6/uL — ABNORMAL LOW (ref 3.80–5.10)
RDW: 12.3 % (ref 11.0–15.0)
Total Lymphocyte: 34.5 %
WBC mixed population: 333 cells/uL (ref 200–950)
WBC: 3 10*3/uL — ABNORMAL LOW (ref 3.8–10.8)

## 2017-09-23 LAB — HIV-1 RNA QUANT-NO REFLEX-BLD
HIV 1 RNA Quant: 25100 copies/mL — ABNORMAL HIGH
HIV-1 RNA Quant, Log: 4.4 Log copies/mL — ABNORMAL HIGH

## 2017-09-23 LAB — LIPID PANEL
Cholesterol: 185 mg/dL (ref <200)
HDL: 56 mg/dL (ref >50)
LDL Cholesterol (Calc): 114 mg/dL (calc) — ABNORMAL HIGH
Non-HDL Cholesterol (Calc): 129 mg/dL (calc) (ref <130)
Total CHOL/HDL Ratio: 3.3 (calc) (ref <5.0)
Triglycerides: 64 mg/dL (ref <150)

## 2017-09-23 LAB — RPR: RPR Ser Ql: NONREACTIVE

## 2017-10-05 ENCOUNTER — Encounter: Payer: Self-pay | Admitting: Internal Medicine

## 2017-10-05 ENCOUNTER — Ambulatory Visit (INDEPENDENT_AMBULATORY_CARE_PROVIDER_SITE_OTHER): Payer: Medicaid Other | Admitting: Internal Medicine

## 2017-10-05 VITALS — BP 151/84 | HR 83 | Temp 98.6°F | Ht 64.0 in | Wt 224.0 lb

## 2017-10-05 DIAGNOSIS — N186 End stage renal disease: Secondary | ICD-10-CM | POA: Diagnosis not present

## 2017-10-05 DIAGNOSIS — Z79899 Other long term (current) drug therapy: Secondary | ICD-10-CM

## 2017-10-05 DIAGNOSIS — Z9119 Patient's noncompliance with other medical treatment and regimen: Secondary | ICD-10-CM

## 2017-10-05 DIAGNOSIS — B20 Human immunodeficiency virus [HIV] disease: Secondary | ICD-10-CM | POA: Diagnosis not present

## 2017-10-05 DIAGNOSIS — Z91199 Patient's noncompliance with other medical treatment and regimen due to unspecified reason: Secondary | ICD-10-CM

## 2017-10-05 MED ORDER — DARUN-COBIC-EMTRICIT-TENOFAF 800-150-200-10 MG PO TABS: 1.0000 | ORAL_TABLET | Freq: Every day | ORAL | 11 refills | Status: AC

## 2017-10-05 MED ORDER — DOLUTEGRAVIR SODIUM 50 MG PO TABS: 50.0000 mg | ORAL_TABLET | Freq: Every day | ORAL | 11 refills | Status: AC

## 2017-10-05 NOTE — Progress Notes (Signed)
Patient ID: [name], female   DOB: 07-28-1955, 62 y.o.   MRN: [id number]  HPI CD 4 count of 210/VL 25,000 - currnetly on RLG/DRVr/TDF/lamivudine- weekly.[id number] negative But her geno from 2014 shows M184V R to lam, and emtricitabine. She reports not taking her medications regularly. Her labs now show that she is detectable. She reports pill fatigue Outpatient Encounter Medications as of 10/05/2017  Medication Sig  . acetaminophen (TYLENOL) 325 MG tablet Take 650 mg by mouth every 4 (four) hours as needed for mild pain.  [name] albuterol (PROVENTIL HFA;VENTOLIN HFA) 108 (90 Base) MCG/ACT inhaler Inhale 2 puffs into the lungs every 6 (six) hours as needed for wheezing or shortness of breath.  [name] aspirin 81 MG chewable tablet Chew 81 mg by mouth daily at 12 noon.   . cinacalcet (SENSIPAR) 30 MG tablet Take 30 mg by mouth daily at 12 noon.   . Darunavir Ethanolate (PREZISTA) 800 MG tablet Take 1 tablet (800 mg total) by mouth daily with breakfast.  . docusate sodium (COLACE) 100 MG capsule Take 1 capsule (100 mg total) by mouth 2 (two) times daily.  . dolutegravir (TIVICAY) 50 MG tablet Take 1 tablet (50 mg total) by mouth daily.  [name] loratadine (CLARITIN) 10 MG tablet Take 10 mg by mouth daily as needed for allergies.  . midodrine (PROAMATINE) 10 MG tablet Take 10 mg by mouth 2 (two) times daily. At 630am and 10pm  . Multiple Vitamins-Minerals (CERTAGEN PO) Take 1 tablet by mouth daily at 12 noon.  . Nutritional Supplements (FEEDING SUPPLEMENT, NEPRO CARB STEADY,) LIQD Take 237 mLs by mouth 3 (three) times daily as needed (Supplement).  . ritonavir (NORVIR) 100 MG TABS Take 1 tablet (100 mg total) by mouth daily with breakfast.  . sevelamer carbonate (RENVELA) 800 MG tablet Take 2,400 mg by mouth 3 (three) times daily with meals.   [name] tenofovir (VIREAD) 300 MG tablet Take 300 mg by mouth once a week. On Fridays   No facility-administered encounter medications on file as of 10/05/2017.       Patient Active Problem List   Diagnosis Date Noted  . ESRD needing dialysis ([place]) 07/21/2015  . Hyponatremia 07/20/2015  . Hypokalemia 07/20/2015  . 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 HIV dementia ([place]) 04/12/2014  . Chronic respiratory failure with hypoxia ([place]) 03/27/2014  . Obesity (BMI 30-39.9) 03/24/2014  . Hypotension, intermittent esp after HD 08/22/2011  . LEG EDEMA, CHRONIC 04/15/2007  . Human immunodeficiency virus (HIV) disease ([place]) 03/19/2007  . ANEMIA OF CHRONIC DISEASE 03/19/2007  . ESRD (end stage renal disease) ([place]) 03/19/2007     Health Maintenance Due  Topic Date Due  . COLONOSCOPY  09/16/2006  . PAP SMEAR  07/29/2016  . INFLUENZA VACCINE  08/06/2017     Review of Systems  Physical Exam   There were no vitals taken for this visit.   Lab Results  Component Value Date   CD4TCELL 20 (L) 09/21/2017   Lab Results  Component Value Date   CD4TABS 210 (L) 09/21/2017   CD4TABS 450 10/20/2016   CD4TABS 490 06/19/2016   Lab Results  Component Value Date   HIV1RNAQUANT 25,100 (H) 09/21/2017   Lab Results  Component Value Date   HEPBSAB NEGATIVE 10/23/2007   Lab Results  Component Value Date   LABRPR NON-REACTIVE 09/21/2017    CBC Lab Results  Component Value Date   WBC 3.0 (L) 09/21/2017   RBC 2.86 (L) 09/21/2017   HGB 9.8 (L)  09/21/2017   HCT 29.1 (L) 09/21/2017   PLT 173 09/21/2017   MCV 101.7 (H) 09/21/2017   MCH 34.3 (H) 09/21/2017   MCHC 33.7 09/21/2017   RDW 12.3 09/21/2017   LYMPHSABS 1,035 09/21/2017   MONOABS 231 06/19/2016   EOSABS 51 09/21/2017    BMET Lab Results  Component Value Date   NA 138 09/21/2017   K 4.5 09/21/2017   CL 96 (L) 09/21/2017   CO2 30 09/21/2017   GLUCOSE 79 09/21/2017   BUN 44 (H) 09/21/2017   CREATININE 7.23 (H) 09/21/2017   CALCIUM 9.4 09/21/2017   GFRNONAA 5 (L) 10/20/2016   GFRAA 6 (L) 10/20/2016      Assessment and Plan hiv disease and med non adherence Will change her to tivicay  plus symtuza to take daily. Will check genotype on her recent labs Have her return in 4 wk to reassess her blood work Spent time on adherence counseling 15 min  ckd 5 - appears at her baseline

## 2017-10-12 ENCOUNTER — Other Ambulatory Visit: Payer: Self-pay | Admitting: Internal Medicine

## 2017-10-12 DIAGNOSIS — Z1231 Encounter for screening mammogram for malignant neoplasm of breast: Secondary | ICD-10-CM

## 2017-10-19 LAB — HIV-1 GENOTYPING (RTI,PI,IN INHBTR): HIV-1 Genotype: DETECTED — AB

## 2017-11-09 ENCOUNTER — Ambulatory Visit: Payer: Medicaid Other | Admitting: Internal Medicine

## 2017-11-13 ENCOUNTER — Ambulatory Visit
Admission: RE | Admit: 2017-11-13 | Discharge: 2017-11-13 | Disposition: A | Discharge status: Home / Self Care | Payer: Medicaid Other | Source: Ambulatory Visit | Attending: Internal Medicine | Admitting: Internal Medicine

## 2017-11-13 ENCOUNTER — Ambulatory Visit: Payer: Medicaid Other

## 2017-11-13 DIAGNOSIS — Z1231 Encounter for screening mammogram for malignant neoplasm of breast: Secondary | ICD-10-CM

## 2017-12-09 ENCOUNTER — Other Ambulatory Visit: Payer: Self-pay

## 2017-12-09 ENCOUNTER — Other Ambulatory Visit: Payer: Medicaid Other

## 2017-12-09 DIAGNOSIS — B20 Human immunodeficiency virus [HIV] disease: Secondary | ICD-10-CM

## 2017-12-10 LAB — T-HELPER CELL (CD4) - (RCID CLINIC ONLY)
CD4 % Helper T Cell: 17 % — ABNORMAL LOW (ref 33–55)
CD4 T Cell Abs: 210 /uL — ABNORMAL LOW (ref 400–2700)

## 2017-12-11 LAB — CBC WITH DIFFERENTIAL/PLATELET
Basophils Absolute: 19 cells/uL (ref 0–200)
Basophils Relative: 0.5 %
Eosinophils Absolute: 59 cells/uL (ref 15–500)
Eosinophils Relative: 1.6 %
HCT: 33 % — ABNORMAL LOW (ref 35.0–45.0)
Hemoglobin: 11.2 g/dL — ABNORMAL LOW (ref 11.7–15.5)
Lymphs Abs: 1158 cells/uL (ref 850–3900)
MCH: 36.6 pg — ABNORMAL HIGH (ref 27.0–33.0)
MCHC: 33.9 g/dL (ref 32.0–36.0)
MCV: 107.8 fL — ABNORMAL HIGH (ref 80.0–100.0)
MPV: 9.8 fL (ref 7.5–12.5)
Monocytes Relative: 10.7 %
Neutro Abs: 2068 cells/uL (ref 1500–7800)
Neutrophils Relative %: 55.9 %
Platelets: 182 10*3/uL (ref 140–400)
RBC: 3.06 10*6/uL — ABNORMAL LOW (ref 3.80–5.10)
RDW: 12.7 % (ref 11.0–15.0)
Total Lymphocyte: 31.3 %
WBC mixed population: 396 cells/uL (ref 200–950)
WBC: 3.7 10*3/uL — ABNORMAL LOW (ref 3.8–10.8)

## 2017-12-11 LAB — COMPLETE METABOLIC PANEL WITH GFR
AG Ratio: 0.9 (calc) — ABNORMAL LOW (ref 1.0–2.5)
ALT: 10 U/L (ref 6–29)
AST: 20 U/L (ref 10–35)
Albumin: 3.9 g/dL (ref 3.6–5.1)
Alkaline phosphatase (APISO): 125 U/L (ref 33–130)
BUN/Creatinine Ratio: 5 (calc) — ABNORMAL LOW (ref 6–22)
BUN: 34 mg/dL — ABNORMAL HIGH (ref 7–25)
CO2: 36 mmol/L — ABNORMAL HIGH (ref 20–32)
Calcium: 10.2 mg/dL (ref 8.6–10.4)
Chloride: 95 mmol/L — ABNORMAL LOW (ref 98–110)
Creat: 7.09 mg/dL — ABNORMAL HIGH (ref 0.50–0.99)
GFR, Est African American: 7 mL/min/{1.73_m2} — ABNORMAL LOW (ref >=60)
GFR, Est Non African American: 6 mL/min/{1.73_m2} — ABNORMAL LOW (ref >=60)
Globulin: 4.4 g/dL (calc) — ABNORMAL HIGH (ref 1.9–3.7)
Glucose, Bld: 80 mg/dL (ref 65–99)
Potassium: 4.9 mmol/L (ref 3.5–5.3)
Sodium: 139 mmol/L (ref 135–146)
Total Bilirubin: 0.6 mg/dL (ref 0.2–1.2)
Total Protein: 8.3 g/dL — ABNORMAL HIGH (ref 6.1–8.1)

## 2017-12-11 LAB — HIV-1 RNA QUANT-NO REFLEX-BLD
HIV 1 RNA Quant: <20 copies/mL — AB
HIV-1 RNA Quant, Log: <1.3 Log copies/mL — AB

## 2017-12-23 ENCOUNTER — Ambulatory Visit: Payer: Medicaid Other | Admitting: Internal Medicine

## 2017-12-23 ENCOUNTER — Ambulatory Visit: Payer: Medicaid Other | Admitting: Infectious Diseases

## 2018-02-08 ENCOUNTER — Ambulatory Visit: Payer: Medicaid Other | Admitting: Internal Medicine

## 2018-02-08 ENCOUNTER — Ambulatory Visit: Payer: Medicaid Other | Admitting: Infectious Diseases

## 2018-03-03 ENCOUNTER — Inpatient Hospital Stay (HOSPITAL_COMMUNITY)
Admission: EM | Admit: 2018-03-03 | Discharge: 2018-03-08 | DRG: 377 | Disposition: A | Discharge status: Skilled Nursing Facility | Payer: Medicaid Other | Source: Skilled Nursing Facility | Attending: Family Medicine | Admitting: Family Medicine

## 2018-03-03 ENCOUNTER — Encounter (HOSPITAL_COMMUNITY): Payer: Self-pay

## 2018-03-03 ENCOUNTER — Other Ambulatory Visit: Payer: Self-pay

## 2018-03-03 DIAGNOSIS — Z8249 Family history of ischemic heart disease and other diseases of the circulatory system: Secondary | ICD-10-CM

## 2018-03-03 DIAGNOSIS — Z6839 Body mass index (BMI) 39.0-39.9, adult: Secondary | ICD-10-CM

## 2018-03-03 DIAGNOSIS — B20 Human immunodeficiency virus [HIV] disease: Secondary | ICD-10-CM | POA: Diagnosis present

## 2018-03-03 DIAGNOSIS — Z9981 Dependence on supplemental oxygen: Secondary | ICD-10-CM

## 2018-03-03 DIAGNOSIS — D472 Monoclonal gammopathy: Secondary | ICD-10-CM | POA: Diagnosis present

## 2018-03-03 DIAGNOSIS — I5022 Chronic systolic (congestive) heart failure: Secondary | ICD-10-CM | POA: Diagnosis present

## 2018-03-03 DIAGNOSIS — E669 Obesity, unspecified: Secondary | ICD-10-CM | POA: Diagnosis present

## 2018-03-03 DIAGNOSIS — K921 Melena: Secondary | ICD-10-CM | POA: Diagnosis not present

## 2018-03-03 DIAGNOSIS — Z841 Family history of disorders of kidney and ureter: Secondary | ICD-10-CM

## 2018-03-03 DIAGNOSIS — N186 End stage renal disease: Secondary | ICD-10-CM

## 2018-03-03 DIAGNOSIS — K644 Residual hemorrhoidal skin tags: Secondary | ICD-10-CM | POA: Diagnosis present

## 2018-03-03 DIAGNOSIS — J9611 Chronic respiratory failure with hypoxia: Secondary | ICD-10-CM | POA: Diagnosis present

## 2018-03-03 DIAGNOSIS — K922 Gastrointestinal hemorrhage, unspecified: Secondary | ICD-10-CM

## 2018-03-03 DIAGNOSIS — Z833 Family history of diabetes mellitus: Secondary | ICD-10-CM

## 2018-03-03 DIAGNOSIS — Z8679 Personal history of other diseases of the circulatory system: Secondary | ICD-10-CM

## 2018-03-03 DIAGNOSIS — K449 Diaphragmatic hernia without obstruction or gangrene: Secondary | ICD-10-CM | POA: Diagnosis present

## 2018-03-03 DIAGNOSIS — K2971 Gastritis, unspecified, with bleeding: Principal | ICD-10-CM | POA: Diagnosis present

## 2018-03-03 DIAGNOSIS — N2581 Secondary hyperparathyroidism of renal origin: Secondary | ICD-10-CM | POA: Diagnosis present

## 2018-03-03 DIAGNOSIS — I428 Other cardiomyopathies: Secondary | ICD-10-CM | POA: Diagnosis present

## 2018-03-03 DIAGNOSIS — Z87891 Personal history of nicotine dependence: Secondary | ICD-10-CM

## 2018-03-03 DIAGNOSIS — E8889 Other specified metabolic disorders: Secondary | ICD-10-CM | POA: Diagnosis present

## 2018-03-03 DIAGNOSIS — K264 Chronic or unspecified duodenal ulcer with hemorrhage: Secondary | ICD-10-CM | POA: Diagnosis present

## 2018-03-03 DIAGNOSIS — D539 Nutritional anemia, unspecified: Secondary | ICD-10-CM | POA: Diagnosis present

## 2018-03-03 DIAGNOSIS — K319 Disease of stomach and duodenum, unspecified: Secondary | ICD-10-CM | POA: Diagnosis present

## 2018-03-03 DIAGNOSIS — F0280 Dementia in other diseases classified elsewhere without behavioral disturbance: Secondary | ICD-10-CM | POA: Diagnosis present

## 2018-03-03 DIAGNOSIS — Z8619 Personal history of other infectious and parasitic diseases: Secondary | ICD-10-CM

## 2018-03-03 DIAGNOSIS — I132 Hypertensive heart and chronic kidney disease with heart failure and with stage 5 chronic kidney disease, or end stage renal disease: Secondary | ICD-10-CM | POA: Diagnosis present

## 2018-03-03 DIAGNOSIS — D120 Benign neoplasm of cecum: Secondary | ICD-10-CM | POA: Diagnosis present

## 2018-03-03 DIAGNOSIS — Z993 Dependence on wheelchair: Secondary | ICD-10-CM

## 2018-03-03 DIAGNOSIS — Z992 Dependence on renal dialysis: Secondary | ICD-10-CM

## 2018-03-03 DIAGNOSIS — K641 Second degree hemorrhoids: Secondary | ICD-10-CM | POA: Diagnosis present

## 2018-03-03 DIAGNOSIS — D62 Acute posthemorrhagic anemia: Secondary | ICD-10-CM | POA: Diagnosis present

## 2018-03-03 DIAGNOSIS — I959 Hypotension, unspecified: Secondary | ICD-10-CM | POA: Diagnosis present

## 2018-03-03 DIAGNOSIS — D509 Iron deficiency anemia, unspecified: Secondary | ICD-10-CM | POA: Diagnosis present

## 2018-03-03 DIAGNOSIS — R195 Other fecal abnormalities: Secondary | ICD-10-CM | POA: Diagnosis present

## 2018-03-03 DIAGNOSIS — Z7982 Long term (current) use of aspirin: Secondary | ICD-10-CM

## 2018-03-03 DIAGNOSIS — Z880 Allergy status to penicillin: Secondary | ICD-10-CM

## 2018-03-03 LAB — CBC WITH DIFFERENTIAL/PLATELET
Abs Immature Granulocytes: 0.01 10*3/uL (ref 0.00–0.07)
Basophils Absolute: 0 10*3/uL (ref 0.0–0.1)
Basophils Relative: 0 %
Eosinophils Absolute: 0.1 10*3/uL (ref 0.0–0.5)
Eosinophils Relative: 1 %
HCT: 27.1 % — ABNORMAL LOW (ref 36.0–46.0)
Hemoglobin: 8.3 g/dL — ABNORMAL LOW (ref 12.0–15.0)
Immature Granulocytes: 0 %
Lymphocytes Relative: 20 %
Lymphs Abs: 1 10*3/uL (ref 0.7–4.0)
MCH: 35.6 pg — ABNORMAL HIGH (ref 26.0–34.0)
MCHC: 30.6 g/dL (ref 30.0–36.0)
MCV: 116.3 fL — ABNORMAL HIGH (ref 80.0–100.0)
Monocytes Absolute: 0.3 10*3/uL (ref 0.1–1.0)
Monocytes Relative: 7 %
Neutro Abs: 3.4 10*3/uL (ref 1.7–7.7)
Neutrophils Relative %: 72 %
Platelets: 165 10*3/uL (ref 150–400)
RBC: 2.33 MIL/uL — ABNORMAL LOW (ref 3.87–5.11)
RDW: 13.1 % (ref 11.5–15.5)
WBC: 4.8 10*3/uL (ref 4.0–10.5)
nRBC: 0 % (ref 0.0–0.2)

## 2018-03-03 LAB — BASIC METABOLIC PANEL
Anion gap: 12 (ref 5–15)
BUN: 51 mg/dL — ABNORMAL HIGH (ref 8–23)
CO2: 30 mmol/L (ref 22–32)
Calcium: 8.8 mg/dL — ABNORMAL LOW (ref 8.9–10.3)
Chloride: 94 mmol/L — ABNORMAL LOW (ref 98–111)
Creatinine, Ser: 6.01 mg/dL — ABNORMAL HIGH (ref 0.44–1.00)
GFR calc Af Amer: 8 mL/min — ABNORMAL LOW (ref >60)
GFR calc non Af Amer: 7 mL/min — ABNORMAL LOW (ref >60)
Glucose, Bld: 82 mg/dL (ref 70–99)
Potassium: 4.8 mmol/L (ref 3.5–5.1)
Sodium: 136 mmol/L (ref 135–145)

## 2018-03-03 MED ORDER — CINACALCET HCL 30 MG PO TABS: 30.0000 mg | ORAL_TABLET | Freq: Every evening | ORAL | Status: DC

## 2018-03-03 MED ORDER — MIDODRINE HCL 5 MG PO TABS
10.0000 mg | ORAL_TABLET | Freq: Every day | ORAL | Status: DC
  Administered 2018-03-03 – 2018-03-08 (×6): 10 mg via ORAL
  Filled 2018-03-03 (×5): qty 2

## 2018-03-03 MED ORDER — HYDROXYZINE HCL 25 MG PO TABS
25.0000 mg | ORAL_TABLET | Freq: Three times a day (TID) | ORAL | Status: DC | PRN
  Administered 2018-03-04: 25 mg via ORAL
  Filled 2018-03-03: qty 1

## 2018-03-03 MED ORDER — ARTIFICIAL TEARS OPHTHALMIC OINT
1.0000 "application " | TOPICAL_OINTMENT | Freq: Every day | OPHTHALMIC | Status: DC
  Filled 2018-03-03 (×2): qty 3.5

## 2018-03-03 MED ORDER — NEPRO/CARBSTEADY PO LIQD
237.0000 mL | Freq: Three times a day (TID) | ORAL | Status: DC | PRN
  Filled 2018-03-03: qty 237

## 2018-03-03 MED ORDER — FERRIC CITRATE 1 GM 210 MG(FE) PO TABS
420.0000 mg | ORAL_TABLET | Freq: Three times a day (TID) | ORAL | Status: DC
  Administered 2018-03-04: 420 mg via ORAL
  Filled 2018-03-03 (×3): qty 2

## 2018-03-03 MED ORDER — ACETAMINOPHEN 325 MG PO TABS
650.0000 mg | ORAL_TABLET | Freq: Four times a day (QID) | ORAL | Status: DC | PRN
  Administered 2018-03-04 – 2018-03-07 (×2): 650 mg via ORAL
  Filled 2018-03-03 (×2): qty 2

## 2018-03-03 MED ORDER — ONDANSETRON HCL 4 MG/2ML IJ SOLN: 4.0000 mg | Freq: Four times a day (QID) | INTRAMUSCULAR | Status: DC | PRN

## 2018-03-03 MED ORDER — ACETAMINOPHEN 650 MG RE SUPP: 650.0000 mg | Freq: Four times a day (QID) | RECTAL | Status: DC | PRN

## 2018-03-03 MED ORDER — SORBITOL 70 % SOLN: 30.0000 mL | Status: DC | PRN

## 2018-03-03 MED ORDER — CALCIUM CARBONATE ANTACID 1250 MG/5ML PO SUSP
500.0000 mg | Freq: Four times a day (QID) | ORAL | Status: DC | PRN
  Filled 2018-03-03: qty 5

## 2018-03-03 MED ORDER — CAMPHOR-MENTHOL 0.5-0.5 % EX LOTN
1.0000 "application " | TOPICAL_LOTION | Freq: Three times a day (TID) | CUTANEOUS | Status: DC | PRN
  Filled 2018-03-03: qty 222

## 2018-03-03 MED ORDER — DOLUTEGRAVIR SODIUM 50 MG PO TABS
50.0000 mg | ORAL_TABLET | Freq: Every day | ORAL | Status: DC
  Administered 2018-03-03 – 2018-03-08 (×6): 50 mg via ORAL
  Filled 2018-03-03 (×6): qty 1

## 2018-03-03 MED ORDER — DOCUSATE SODIUM 100 MG PO CAPS
100.0000 mg | ORAL_CAPSULE | Freq: Two times a day (BID) | ORAL | Status: DC
  Administered 2018-03-03 – 2018-03-07 (×7): 100 mg via ORAL
  Filled 2018-03-03 (×8): qty 1

## 2018-03-03 MED ORDER — ZOLPIDEM TARTRATE 5 MG PO TABS
5.0000 mg | ORAL_TABLET | Freq: Every evening | ORAL | Status: DC | PRN
  Administered 2018-03-04 – 2018-03-07 (×3): 5 mg via ORAL
  Filled 2018-03-03 (×3): qty 1

## 2018-03-03 MED ORDER — ALBUTEROL SULFATE (2.5 MG/3ML) 0.083% IN NEBU: 3.0000 mL | INHALATION_SOLUTION | Freq: Four times a day (QID) | RESPIRATORY_TRACT | Status: DC | PRN

## 2018-03-03 MED ORDER — SUCROFERRIC OXYHYDROXIDE 500 MG PO CHEW
1000.0000 mg | CHEWABLE_TABLET | Freq: Three times a day (TID) | ORAL | Status: DC
  Administered 2018-03-05: 1000 mg via ORAL
  Filled 2018-03-03 (×9): qty 2

## 2018-03-03 MED ORDER — DOCUSATE SODIUM 283 MG RE ENEM
1.0000 | ENEMA | RECTAL | Status: DC | PRN
  Filled 2018-03-03: qty 1

## 2018-03-03 MED ORDER — ONDANSETRON HCL 4 MG PO TABS
4.0000 mg | ORAL_TABLET | Freq: Four times a day (QID) | ORAL | Status: DC | PRN
  Administered 2018-03-04: 4 mg via ORAL
  Filled 2018-03-03 (×2): qty 1

## 2018-03-03 MED ORDER — DARUN-COBIC-EMTRICIT-TENOFAF 800-150-200-10 MG PO TABS
1.0000 | ORAL_TABLET | Freq: Every day | ORAL | Status: DC
  Administered 2018-03-03 – 2018-03-08 (×6): 1 via ORAL
  Filled 2018-03-03 (×7): qty 1

## 2018-03-03 NOTE — Progress Notes (Signed)
Pt refused evening medications since she is on a clear liquid diet. This care RN explained to pt the type of foods she can have. Pt stated "if you can't bring me a sandwich then don't bring me no pills."

## 2018-03-03 NOTE — ED Triage Notes (Addendum)
Pt states that when she woke up this morning she had one occurrence of bloody diarrhea, she also denies abd pain/cramping, &/or n/v.

## 2018-03-03 NOTE — Progress Notes (Signed)
Pt denied her bedtime medicines.

## 2018-03-03 NOTE — Progress Notes (Addendum)
HIV Genotype Composite Data Genotype Dates:   Mutations in Bold impact drug susceptibility RT Mutations M184V, K103N, V90I  PI Mutations L63S, H69Q, V77I  Integrase Mutations N155H, M50I, L74I   Interpretation of Genotype Data per [employer] HIV Database Nucleoside RTIs  abacavir (ABC) Low-Level Resistance zidovudine (AZT) Susceptible emtricitabine (FTC) High-Level Resistance lamivudine (3TC) High-Level Resistance tenofovir (TDF) Susceptible   Non-Nucleoside RTIs  doravirine (DOR) Susceptible efavirenz (EFV) High-Level Resistance etravirine (ETR) Susceptible nevirapine (NVP) High-Level Resistance rilpivirine (RPV) Susceptible   Protease Inhibitors  atazanavir/r (ATV/r) Susceptible darunavir/r (DRV/r) Susceptible lopinavir/r (LPV/r) Susceptible   Integrase Inhibitors  bictegravir (BIC) Potential Low-Level Resistance dolutegravir (DTG) Potential Low-Level Resistance elvitegravir (EVG) High-Level Resistance raltegravir (RAL) High-Level Resistance   Current regimen = Symtuza/Dolutegravir  Onnie Boer, PharmD, BCIDP, AAHIVP, CPP Infectious Disease Pharmacist 03/03/2018 2:59 PM

## 2018-03-03 NOTE — H&P (Signed)
History and Physical    [name]:[id number] DOB: 1955/03/05 DOA: 03/03/2018  PCP: Patient, No Pcp Per Consultants:  Baxter Flattery - ID Patient coming from:  [facility]; NOK: Sister, [phone]  Chief Complaint: BRBPR  HPI: [name] is a 63 y.o. 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  She woke up early this AM and had diarrhea with blood in it.  She had one large episode and none since.  Not light-headed or dizzy.  No SOB.  No abdominal pain.     ED Course: Painless rectal bleeding.  Hgb 8.3, baseline in 9s.  No h/o scope.  Lively diverticular bleeding.  Dr. Wilson Singer will call GI.  Review of Systems: As per HPI; otherwise review of systems reviewed and negative.   Ambulatory Status: Wheelchair bound  Past Medical History:  Diagnosis Date  . Anemia   . Bacteriuria, asymptomatic 04/04/10   Culture grew VRE sensitive to linesolid   . Cancer ([place])   . 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 Clostridium difficile infection 04/04/10   07/2011 and 08/2011  . Endocarditis    MSSA  . ESRD (end stage renal disease) ([place]) 03/19/2007   Started HD in 2010.  ESRD was due to HIV per pt. Gets HD on [employer] at [facility] on TTS schedule.     [name] History of bacteremia    MSSA; 05/2011, 08/2011  . Human immunodeficiency virus (HIV) disease ([place])   . Hypertension   . MGUS (monoclonal gammopathy of unknown significance)   . Peripheral vascular disease ([place])   . Septicemia due to enterococcus ([place])   . Systolic heart failure ([place])    08/27/11 EF 35-40%    Past Surgical History:  Procedure Laterality Date  . ABDOMINAL AORTAGRAM N/A 12/23/2010   Procedure: ABDOMINAL Maxcine Ham;  Surgeon: Angelia Mould, MD;  Location: [facility] CATH LAB;  Service: Cardiovascular;  Laterality: N/A;  . AV FISTULA PLACEMENT    .  AV FISTULA PLACEMENT  09/01/2011   Procedure: INSERTION OF ARTERIOVENOUS (AV) GORE-TEX GRAFT ARM;  Surgeon: Rosetta Posner, MD;  Location: [facility] OR;  Service: Vascular;  Laterality: Right;  Using 72mm x 50  stretch goretex graft  . AV FISTULA PLACEMENT Left 05/03/2012   Procedure: INSERTION OF ARTERIOVENOUS (AV) GORE-TEX GRAFT THIGH USING 85mm X 70cm Gore-Tex Graft ;  Surgeon: Rosetta Posner, MD;  Location: [place];  Service: Vascular;  Laterality: Left;  . [place] REMOVAL Right 03/29/2012   Procedure: REMOVAL OF ARTERIOVENOUS GORETEX GRAFT ([place]);  Surgeon: Rosetta Posner, MD;  Location: [place];  Service: Vascular;  Laterality: Right;  . INSERTION OF DIALYSIS CATHETER  12/30/2010   Procedure: INSERTION OF DIALYSIS CATHETER;  Surgeon: Elam Dutch, MD;  Location: [place];  Service: Vascular;  Laterality: Left;  Insertion of left internal jugular dialysis catheter  . INSERTION OF DIALYSIS CATHETER  05/26/2011   Procedure: INSERTION OF DIALYSIS CATHETER;  Surgeon: Conrad Pleasanton, MD;  Location: [place];  Service: Vascular;  Laterality: N/A;  Insertion tunneled dialysis catheter in Right Internal Jugular with 23 cm catheter   . INSERTION OF DIALYSIS CATHETER  08/26/2011   Procedure: INSERTION OF DIALYSIS CATHETER;  Surgeon: Elam Dutch, MD;  Location: [place];  Service: Vascular;  Laterality: Right;  insertion of Right Femoral vein  Dialysis catheter  . INSERTION OF DIALYSIS CATHETER N/A 03/29/2012   Procedure: INSERTION OF DIALYSIS CATHETER;  Surgeon: Rosetta Posner, MD;  Location: [place];  Service: Vascular;  Laterality: N/A;  Ultrasound guided  . LOWER EXTREMITY ANGIOGRAM  12/23/2010   Procedure: LOWER EXTREMITY ANGIOGRAM;  Surgeon: Angelia Mould, MD;  Location[facility] CATH LAB;  Service: Cardiovascular;;  . TEE WITHOUT CARDIOVERSION  08/27/2011   Procedure: TRANSESOPHAGEAL ECHOCARDIOGRAM (TEE);  Surgeon: Pixie Casino, MD;  Location: [facility] ENDOSCOPY;  Service: Cardiovascular;  Laterality: N/A;  . TEE WITHOUT  CARDIOVERSION N/A 02/16/2012   Procedure: TRANSESOPHAGEAL ECHOCARDIOGRAM (TEE);  Surgeon: Fay Records, MD;  Location: [place];  Service: Cardiovascular;  Laterality: N/A;  Rm [id number]  . THROMBECTOMY AND REVISION OF ARTERIOVENTOUS (AV) GORETEX  GRAFT Right 02/13/2012   Procedure: THROMBECTOMY AND REVISION OF ARTERIOVENTOUS (AV) GORETEX  GRAFT;  Surgeon: Angelia Mould, MD;  Location: [place];  Service: Vascular;  Laterality: Right;  . TRANSTHORACIC ECHOCARDIOGRAM  08/27/2011   EF 35-40%, mild-moderate concetric hypertrophy of the LV, moderately reduced LV systolic function,  . VASCULAR SURGERY      Social History   Socioeconomic History  . Marital status: Single    Spouse name: Not on file  . Number of children: Not on file  . Years of education: Not on file  . Highest education level: Not on file  Occupational History  . Not on file  Social Needs  . Financial resource strain: Not on file  . Food insecurity:    Worry: Not on file    Inability: Not on file  . Transportation needs:    Medical: Not on file    Non-medical: Not on file  Tobacco Use  . 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 Smokeless tobacco: Never Used  . Tobacco comment: quit when she was diagnosed with HIV  Substance and Sexual Activity  . Alcohol use: No    Alcohol/week: 0.0 standard drinks  . Drug use: Yes    Types: Cocaine    Comment: quit 41yrs ago  . Sexual activity: Never    Birth control/protection: Post-menopausal  Lifestyle  . Physical activity:    Days per week: Not on file    Minutes per session: Not on file  . Stress: Not on file  Relationships  . Social connections:    Talks on phone: Not on file    Gets together: Not on file    Attends religious service: Not on file    Active member of club or organization: Not on file    Attends meetings of clubs or organizations: Not on file    Relationship status: Not on file  . Intimate partner violence:    Fear of current or ex partner: Not on file     Emotionally abused: Not on file    Physically abused: Not on file    Forced sexual activity: Not on file  Other Topics Concern  . Not on file  Social History Narrative   Lives with daughter(does not know dx). Oldest daughter does not dx.   Current partner of may years,? fidelity    Allergies  Allergen Reactions  . Penicillins Nausea And Vomiting and Rash    Has patient had a PCN reaction causing immediate rash, facial/tongue/throat swelling, SOB or lightheadedness with hypotension: Yes Has patient had a PCN reaction causing severe rash involving mucus membranes or skin necrosis: No Has patient had a PCN reaction that required hospitalization No Has patient had  a PCN reaction occurring within the last 10 years: Yes If all of the above answers are "NO", then may proceed with Cephalosporin use.     Family History  Problem Relation Age of Onset  . Kidney disease Mother   . Cancer Sister   . Heart disease Father   . Alcohol abuse Other        family h/o addiction/alcoholism  . Diabetes Cousin        first degree relatives  . Kidney disease Maternal Uncle     Prior to Admission medications   Medication Sig Start Date End Date Taking? Authorizing Provider  albuterol (PROVENTIL HFA;VENTOLIN HFA) 108 (90 Base) MCG/ACT inhaler Inhale 2 puffs into the lungs every 6 (six) hours as needed for wheezing or shortness of breath.   Yes [provider]  artificial tears (LACRILUBE) OINT ophthalmic ointment Place 1 application into the left eye at bedtime.   Yes [provider]  aspirin 81 MG chewable tablet Chew 81 mg by mouth daily at 12 noon.    Yes [provider]  cinacalcet (SENSIPAR) 30 MG tablet Take 30 mg by mouth every evening. Given at 1700   Yes [provider]  Darunavir-Cobicisctat-Emtricitabine-Tenofovir Alafenamide (SYMTUZA) 800-150-200-10 MG TABS Take 1 tablet by mouth daily with breakfast. Patient taking differently: Take 1 tablet by mouth  daily at 12 noon. 12 noon 10/05/17  Yes Carlyle Basques, MD  docusate sodium (COLACE) 100 MG capsule Take 1 capsule (100 mg total) by mouth 2 (two) times daily. Patient taking differently: Take 100 mg by mouth 2 (two) times daily. 12 noon and 2100 07/20/15  Yes Hosie Poisson, MD  dolutegravir (TIVICAY) 50 MG tablet Take 1 tablet (50 mg total) by mouth daily. Patient taking differently: Take 50 mg by mouth daily at 12 noon. 12 noon 10/05/17  Yes Carlyle Basques, MD  ferric citrate (AURYXIA) 1 GM 210 MG(Fe) tablet Take 420 mg by mouth 3 (three) times daily with meals.   Yes [provider]  guaiFENesin (MUCINEX) 600 MG 12 hr tablet Take 600 mg by mouth 2 (two) times daily.   Yes [provider]  lidocaine-prilocaine (EMLA) cream Apply 1 application topically as needed. Dialysis days 02/25/18  Yes [provider]  midodrine (PROAMATINE) 10 MG tablet Take 10 mg by mouth daily at 12 noon. 12 noon   Yes [provider]  Multiple Vitamins-Minerals (CERTAGEN PO) Take 1 tablet by mouth daily at 12 noon.   Yes [provider]  Nutritional Supplements (FEEDING SUPPLEMENT, NEPRO CARB STEADY,) LIQD Take 237 mLs by mouth 3 (three) times daily as needed (Supplement). 07/20/15  Yes Hosie Poisson, MD  sucroferric oxyhydroxide (VELPHORO) 500 MG chewable tablet Chew 1,000 mg by mouth 3 (three) times daily with meals.   Yes [provider]  Darunavir Ethanolate (PREZISTA) 800 MG tablet Take 1 tablet (800 mg total) by mouth daily with breakfast. Patient not taking: Reported on 03/03/2018 09/02/11   McLean-Scocuzza, Nino Glow, MD  ritonavir (NORVIR) 100 MG TABS Take 1 tablet (100 mg total) by mouth daily with breakfast. Patient not taking: Reported on 03/03/2018 09/02/11   McLean-Scocuzza, Nino Glow, MD    Physical Exam: Vitals:   03/03/18 1200 03/03/18 1215 03/03/18 1310 03/03/18 1341  BP: 107/70 114/61 130/70   Pulse:  75 65   Resp: 12 16 18    Temp:   (!) 97.5 F (36.4  C)   TempSrc:   Oral   SpO2:  100% 100%  Weight:    107.1 kg  Height:    5\' 5"  (1.651 m)     . General:  Appears calm and comfortable and is NAD; appears chronically ill and older than stated age . Eyes:  PERRL, EOMI, normal lids, iris . ENT:  grossly normal hearing, lips & tongue, mmm . Neck:  no LAD, masses or thyromegaly . Cardiovascular:  RRR, no m/r/g. No LE edema.  [name] Respiratory:   CTA bilaterally with no wheezes/rales/rhonchi.  Normal respiratory effort. . Abdomen:  soft, NT, ND, NABS . Skin:  no rash or induration seen on limited exam . Musculoskeletal:  grossly normal tone BUE/BLE, good ROM, no bony abnormality . Psychiatric:  grossly normal mood and affect, speech fluent and appropriate but sparse, AOx2 . Neurologic:  CN 2-12 grossly intact, moves all extremities in coordinated fashion, sensation intact    Radiological Exams on Admission: No results found.  EKG: not done   Labs on Admission: I have personally reviewed the available labs and imaging studies at the time of the admission.  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  -Observation  -Repeat CBC in AM unless she has a repeat bloody BM (and then sooner) -Continue to monitor for recurrent bleeding  -GI consult requested by EDP  ESRD -Patient on chronic TTS HD -Nephrology prn order set utilized -She does not appear to be volume overloaded or  otherwise in need of acute HD -Nephrology is aware that patient will need HD tomorrow  Intermittent hypotension -Continue Midodrine  HIV with dementia -h/o medication nonadherence, but she is now in SNF and so should be adherent -She is due for repeat ID clinic appointment -Continue Symtuza and T[place]  Obesity -Appears to be sedentary and is wheelchair bound -With her dementia, this is unlikely to have meaningful improvement   DVT prophylaxis:  SCDs Code Status:  Full - confirmed with patient Family Communication: None present Disposition Plan: SNF once clinically improved Consults called: SW; GI (by ER); Nephrology Admission status: It is my clinical opinion that referral for OBSERVATION is reasonable and necessary in this patient based on the above information provided. The aforementioned taken together are felt to place the patient at high risk for further clinical deterioration. However it is anticipated that the patient may be medically stable for discharge from the hospital within 24 to 48 hours.    Karmen Bongo MD [facility]   How to contact the [facility] Attending or Consulting provider [place] or covering provider during after hours [place], for this patient?  1. Check the care team in [facility] and look for a) attending/consulting TRH provider listed and b) the [facility] team listed 2. Log into [email or web address] and use [place]'s universal password to access. If you do not have the password, please contact the hospital operator. 3. Locate the [facility] provider you are looking for under [facility] and page to a number that you can be directly reached. 4. If you still  have difficulty reaching the provider, please page the [facility] (Director on Call) for the Hospitalists listed on amion for assistance.   03/03/2018, 5:21 PM

## 2018-03-03 NOTE — ED Provider Notes (Signed)
[facility] DE[facility]MENT Provider Note   CSN: [id number] Arrival date & time: 03/03/18  2637    History   Chief Complaint Chief Complaint  Patient presents with  . GI Bleeding    HPI MYLIYAH REBUCK is a 63 y.o. female.  HPI   63 y.o. 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  She woke up early this AM and had diarrhea with blood in it.  She had one large episode and none since.  Not light-headed or dizzy.  No SOB.  No abdominal pain.     Past Medical History:  Diagnosis Date  . Anemia   . Bacteriuria, asymptomatic 04/04/10   Culture grew VRE sensitive to linesolid   . Cancer ([place])   . 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 Clostridium difficile infection 04/04/10   07/2011 and 08/2011  . Endocarditis    MSSA  . ESRD (end stage renal disease) ([place]) 03/19/2007   Started HD in 2010.  ESRD was due to HIV per pt. Gets HD on [employer] at [facility] on TTS schedule.     [name] History of bacteremia    MSSA; 05/2011, 08/2011  . Human immunodeficiency virus (HIV) disease ([place])   . Hypertension   . MGUS (monoclonal gammopathy of unknown significance)   . Peripheral vascular disease ([place])   . Septicemia due to enterococcus ([place])   . Systolic heart failure ([place])    08/27/11 EF 35-40%    Patient Active Problem List   Diagnosis Date Noted  . ESRD needing dialysis ([place]) 07/21/2015  . Hyponatremia 07/20/2015  . Hypokalemia 07/20/2015  . 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 Chronic respiratory failure with hypoxia ([place]) 03/27/2014  . Obesity (BMI 30-39.9) 03/24/2014  . Hypotension, intermittent esp after HD 08/22/2011  . LEG EDEMA, CHRONIC 04/15/2007  . Human immunodeficiency virus (HIV) disease ([place]) 03/19/2007  . ANEMIA OF CHRONIC DISEASE 03/19/2007    . ESRD (end stage renal disease) ([place]) 03/19/2007    Past Surgical History:  Procedure Laterality Date  . ABDOMINAL AORTAGRAM N/A 12/23/2010   Procedure: ABDOMINAL Maxcine Ham;  Surgeon: Angelia Mould, MD;  Location: [facility] CATH LAB;  Service: Cardiovascular;  Laterality: N/A;  . AV FISTULA PLACEMENT    . AV FISTULA PLACEMENT  09/01/2011   Procedure: INSERTION OF ARTERIOVENOUS (AV) GORE-TEX GRAFT ARM;  Surgeon: Rosetta Posner, MD;  Location: [facility] OR;  Service: Vascular;  Laterality: Right;  Using 61mm x 50  stretch goretex graft  . AV FISTULA PLACEMENT Left 05/03/2012   Procedure: INSERTION OF ARTERIOVENOUS (AV) GORE-TEX GRAFT THIGH USING 33mm X 70cm Gore-Tex Graft ;  Surgeon: Rosetta Posner, MD;  Location: [facility];  Service: Vascular;  Laterality: Left;  . [place] REMOVAL Right 03/29/2012   Procedure: REMOVAL OF ARTERIOVENOUS GORETEX GRAFT ([place]);  Surgeon: Rosetta Posner, MD;  Location: [place];  Service: Vascular;  Laterality: Right;  . INSERTION OF DIALYSIS CATHETER  12/30/2010   Procedure: INSERTION OF DIALYSIS CATHETER;  Surgeon: Elam Dutch, MD;  Location: [facility];  Service: Vascular;  Laterality: Left;  Insertion of left internal jugular dialysis catheter  . INSERTION OF DIALYSIS CATHETER  05/26/2011   Procedure: INSERTION OF DIALYSIS CATHETER;  Surgeon: Conrad Blacklake, MD;  Location: [facility];  Service:  Vascular;  Laterality: N/A;  Insertion tunneled dialysis catheter in Right Internal Jugular with 23 cm catheter   . INSERTION OF DIALYSIS CATHETER  08/26/2011   Procedure: INSERTION OF DIALYSIS CATHETER;  Surgeon: Elam Dutch, MD;  Location: [facility] OR;  Service: Vascular;  Laterality: Right;  insertion of Right Femoral vein Dialysis catheter  . INSERTION OF DIALYSIS CATHETER N/A 03/29/2012   Procedure: INSERTION OF DIALYSIS CATHETER;  Surgeon: Rosetta Posner, MD;  Location: [facility];  Service: Vascular;  Laterality: N/A;  Ultrasound guided  . LOWER EXTREMITY ANGIOGRAM  12/23/2010   Procedure: LOWER EXTREMITY  ANGIOGRAM;  Surgeon: Angelia Mould, MD;  Location: [facility] CATH LAB;  Service: Cardiovascular;;  . TEE WITHOUT CARDIOVERSION  08/27/2011   Procedure: TRANSESOPHAGEAL ECHOCARDIOGRAM (TEE);  Surgeon: Pixie Casino, MD;  Location: [facility] ENDOSCOPY;  Service: Cardiovascular;  Laterality: N/A;  . TEE WITHOUT CARDIOVERSION N/A 02/16/2012   Procedure: TRANSESOPHAGEAL ECHOCARDIOGRAM (TEE);  Surgeon: Fay Records, MD;  Location: [place];  Service: Cardiovascular;  Laterality: N/A;  Rm 6710  . THROMBECTOMY AND REVISION OF ARTERIOVENTOUS (AV) GORETEX  GRAFT Right 02/13/2012   Procedure: THROMBECTOMY AND REVISION OF ARTERIOVENTOUS (AV) GORETEX  GRAFT;  Surgeon: Angelia Mould, MD;  Location: [facility];  Service: Vascular;  Laterality: Right;  . TRANSTHORACIC ECHOCARDIOGRAM  08/27/2011   EF 35-40%, mild-moderate concetric hypertrophy of the LV, moderately reduced LV systolic function,  . VASCULAR SURGERY       OB History   No obstetric history on file.      Home Medications    Prior to Admission medications   Medication Sig Start Date End Date Taking? Authorizing Provider  aspirin 81 MG chewable tablet Chew 81 mg by mouth daily at 12 noon.    Yes [provider]  cinacalcet (SENSIPAR) 30 MG tablet Take 30 mg by mouth every evening. Given at 1700   Yes [provider]  Darunavir-Cobicisctat-Emtricitabine-Tenofovir Alafenamide (SYMTUZA) 800-150-200-10 MG TABS Take 1 tablet by mouth daily with breakfast. Patient taking differently: Take 1 tablet by mouth daily at 12 noon. 12 noon 10/05/17  Yes Carlyle Basques, MD  docusate sodium (COLACE) 100 MG capsule Take 1 capsule (100 mg total) by mouth 2 (two) times daily. Patient taking differently: Take 100 mg by mouth 2 (two) times daily. 12 noon and 2100 07/20/15  Yes Hosie Poisson, MD  dolutegravir (TIVICAY) 50 MG tablet Take 1 tablet (50 mg total) by mouth daily. Patient taking differently: Take 50 mg by mouth daily at 12 noon. 12 noon  10/05/17  Yes Carlyle Basques, MD  midodrine (PROAMATINE) 10 MG tablet Take 10 mg by mouth daily at 12 noon. 12 noon   Yes [provider]  Multiple Vitamins-Minerals (CERTAGEN PO) Take 1 tablet by mouth daily at 12 noon.   Yes [provider]  acetaminophen (TYLENOL) 325 MG tablet Take 650 mg by mouth every 4 (four) hours as needed for mild pain.    [provider]  albuterol (PROVENTIL HFA;VENTOLIN HFA) 108 (90 Base) MCG/ACT inhaler Inhale 2 puffs into the lungs every 6 (six) hours as needed for wheezing or shortness of breath.    [provider]  Darunavir Ethanolate (PREZISTA) 800 MG tablet Take 1 tablet (800 mg total) by mouth daily with breakfast. Patient not taking: Reported on 03/03/2018 09/02/11   McLean-Scocuzza, Nino Glow, MD  loratadine (CLARITIN) 10 MG tablet Take 10 mg by mouth daily as needed for allergies.    [provider]  Nutritional  Supplements (FEEDING SUPPLEMENT, NEPRO CARB STEADY,) LIQD Take 237 mLs by mouth 3 (three) times daily as needed (Supplement). 07/20/15   Hosie Poisson, MD  ritonavir (NORVIR) 100 MG TABS Take 1 tablet (100 mg total) by mouth daily with breakfast. 09/02/11   McLean-Scocuzza, Nino Glow, MD  sevelamer carbonate (RENVELA) 800 MG tablet Take 2,400 mg by mouth 3 (three) times daily with meals.     [provider]  tenofovir (VIREAD) 300 MG tablet Take 300 mg by mouth once a week. On Fridays    [provider]    Family History Family History  Problem Relation Age of Onset  . Kidney disease Mother   . Cancer Sister   . Heart disease Father   . Alcohol abuse Other        family h/o addiction/alcoholism  . Diabetes Cousin        first degree relatives  . Kidney disease Maternal Uncle     Social History Social History   Tobacco Use  . 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 Smokeless tobacco: Never Used  . Tobacco comment: quit when she was diagnosed with HIV  Substance Use Topics  . Alcohol use:  No    Alcohol/week: 0.0 standard drinks  . Drug use: Yes    Types: Cocaine    Comment: quit 30yrs ago     Allergies   Penicillins   Review of Systems Review of Systems  All systems reviewed and negative, other than as noted in HPI.  Physical Exam Updated Vital Signs BP 122/65   Pulse 68   Temp 97.8 F (36.6 C) (Oral)   Resp 15   Ht 5\' 9"  (1.753 m)   Wt 103.4 kg   SpO2 100%   BMI 33.67 kg/m   Physical Exam Vitals signs and nursing note reviewed.  Constitutional:      General: She is not in acute distress.    Appearance: She is well-developed.  HENT:     Head: Normocephalic and atraumatic.  Eyes:     General:        Right eye: No discharge.        Left eye: No discharge.     Conjunctiva/sclera: Conjunctivae normal.  Neck:     Musculoskeletal: Neck supple.  Cardiovascular:     Rate and Rhythm: Normal rate and regular rhythm.     Heart sounds: Normal heart sounds. No murmur. No friction rub. No gallop.   Pulmonary:     Effort: Pulmonary effort is normal. No respiratory distress.     Breath sounds: Normal breath sounds.  Abdominal:     General: There is no distension.     Palpations: Abdomen is soft.     Tenderness: There is no abdominal tenderness.  Genitourinary:    Comments: Gross blood on exam Musculoskeletal:        General: No tenderness.  Skin:    General: Skin is warm and dry.  Neurological:     Mental Status: She is alert.  Psychiatric:        Behavior: Behavior normal.        Thought Content: Thought content normal.      ED Treatments / Results  Labs (all labs ordered are listed, but only abnormal results are displayed) Labs Reviewed  CBC WITH DIFFERENTIAL/PLATELET - Abnormal; Notable for the following components:      Result Value   RBC 2.33 (*)    Hemoglobin 8.3 (*)    HCT 27.1 (*)  MCV 116.3 (*)    MCH 35.6 (*)    All other components within normal limits  BASIC METABOLIC PANEL - Abnormal; Notable for the following components:    Chloride 94 (*)    BUN 51 (*)    Creatinine, Ser 6.01 (*)    Calcium 8.8 (*)    GFR calc non Af Amer 7 (*)    GFR calc Af Amer 8 (*)    All other components within normal limits  TYPE AND SCREEN    EKG None  Radiology No results found.  Procedures Procedures (including critical care time)  Medications Ordered in ED Medications - No data to display   Initial Impression / Assessment and Plan / ED Course  I have reviewed the triage vital signs and the nursing notes.  Pertinent labs & imaging results that were available during my care of the patient were reviewed by me and considered in my medical decision making (see chart for details).     62yF with bloody stool. No abdominal pain. Benign abdominal exam. Diverticular? She is anemic. Hx of the same as expected with ESRD. Lowest previous hemoglobin I can readily review was 9.7. She is at 8.3 today. She is HD stable. Only on 81mg  of ASA. Type and screen. Will admit.   Final Clinical Impressions(s) / ED Diagnoses   Final diagnoses:  Acute GI bleeding    ED Discharge Orders    None       Virgel Manifold, MD 03/10/18 [phone]

## 2018-03-03 NOTE — ED Notes (Signed)
ED TO INPATIENT HANDOFF REPORT  ED Nurse Name and Phone #: [phone]  S Name/Age/Gender [name] 63 y.o. female Room/Bed: 016C/016C  Code Status   Code Status: Prior  Home/SNF/Other Home Patient oriented to: self, place, time and situation Is this baseline? Yes   Triage Complete: Triage complete  Chief Complaint GI Bleed  Triage Note Pt states that when she woke up this morning she had one occurrence of bloody diarrhea, she also denies abd pain/cramping, &/or n/v.    Allergies Allergies  Allergen Reactions  . Penicillins Nausea And Vomiting and Rash    Has patient had a PCN reaction causing immediate rash, facial/tongue/throat swelling, SOB or lightheadedness with hypotension: Yes Has patient had a PCN reaction causing severe rash involving mucus membranes or skin necrosis: No Has patient had a PCN reaction that required hospitalization No Has patient had a PCN reaction occurring within the last 10 years: Yes If all of the above answers are "NO", then may proceed with Cephalosporin use.     Level of Care/Admitting Diagnosis ED Disposition    ED Disposition Condition Comment   Admit  Hospital Area: [facility] [[id number]]  Level of Care: Med-Surg [16]  I expect the patient will be discharged within 24 hours: Yes  LOW acuity---Tx typically complete <24 hrs---ACUTE conditions typically can be evaluated <24 hours---LABS likely to return to acceptable levels <24 hours---IS near functional baseline---EXPECTED to return to current living arrangement---NOT newly hypoxic: Meets criteria for 5C-Observation unit  Diagnosis: Hematochezia [322025]  Admitting Physician: Karmen Bongo [[id number]]  Attending Physician: Karmen Bongo [[id number]]  PT Class (Do Not Modify): Observation [104]  PT Acc Code (Do Not Modify): Observation [10022]       B Medical/Surgery History Past Medical History:  Diagnosis Date  . Anemia   . Bacteriuria, asymptomatic 04/04/10   Culture  grew VRE sensitive to linesolid   . Cancer ([place])   . 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 Clostridium difficile infection 04/04/10   07/2011 and 08/2011  . Endocarditis    MSSA  . ESRD (end stage renal disease) ([place]) 03/19/2007   Started HD in 2010.  ESRD was due to HIV per pt. Gets HD on [employer] at [facility] on TTS schedule.     [name] History of bacteremia    MSSA; 05/2011, 08/2011  . Human immunodeficiency virus (HIV) disease ([place])   . Hypertension   . MGUS (monoclonal gammopathy of unknown significance)   . Peripheral vascular disease ([place])   . Septicemia due to enterococcus ([place])   . Systolic heart failure ([place])    08/27/11 EF 35-40%   Past Surgical History:  Procedure Laterality Date  . ABDOMINAL AORTAGRAM N/A 12/23/2010   Procedure: ABDOMINAL Maxcine Ham;  Surgeon: Angelia Mould, MD;  Location: [facility] CATH LAB;  Service: Cardiovascular;  Laterality: N/A;  . AV FISTULA PLACEMENT    . AV FISTULA PLACEMENT  09/01/2011   Procedure: INSERTION OF ARTERIOVENOUS (AV) GORE-TEX GRAFT ARM;  Surgeon: Rosetta Posner, MD;  Location: [facility] OR;  Service: Vascular;  Laterality: Right;  Using 34mm x 50  stretch goretex graft  . AV FISTULA PLACEMENT Left 05/03/2012   Procedure: INSERTION OF ARTERIOVENOUS (AV) GORE-TEX GRAFT THIGH USING 24mm X 70cm Gore-Tex Graft ;  Surgeon: Rosetta Posner, MD;  Location: [place];  Service: Vascular;  Laterality: Left;  . [place] REMOVAL Right 03/29/2012   Procedure: REMOVAL OF ARTERIOVENOUS GORETEX GRAFT (  [place]);  Surgeon: Rosetta Posner, MD;  Location: [place];  Service: Vascular;  Laterality: Right;  . INSERTION OF DIALYSIS CATHETER  12/30/2010   Procedure: INSERTION OF DIALYSIS CATHETER;  Surgeon: Elam Dutch, MD;  Location: [facility];  Service: Vascular;  Laterality: Left;  Insertion of left internal jugular dialysis catheter  . INSERTION OF DIALYSIS CATHETER  05/26/2011   Procedure: INSERTION OF DIALYSIS CATHETER;  Surgeon: Conrad Gahanna,  MD;  Location: [place];  Service: Vascular;  Laterality: N/A;  Insertion tunneled dialysis catheter in Right Internal Jugular with 23 cm catheter   . INSERTION OF DIALYSIS CATHETER  08/26/2011   Procedure: INSERTION OF DIALYSIS CATHETER;  Surgeon: Elam Dutch, MD;  Location: [facility] OR;  Service: Vascular;  Laterality: Right;  insertion of Right Femoral vein Dialysis catheter  . INSERTION OF DIALYSIS CATHETER N/A 03/29/2012   Procedure: INSERTION OF DIALYSIS CATHETER;  Surgeon: Rosetta Posner, MD;  Location: [facility];  Service: Vascular;  Laterality: N/A;  Ultrasound guided  . LOWER EXTREMITY ANGIOGRAM  12/23/2010   Procedure: LOWER EXTREMITY ANGIOGRAM;  Surgeon: Angelia Mould, MD;  Location: [facility] CATH LAB;  Service: Cardiovascular;;  . TEE WITHOUT CARDIOVERSION  08/27/2011   Procedure: TRANSESOPHAGEAL ECHOCARDIOGRAM (TEE);  Surgeon: Pixie Casino, MD;  Location: [facility] ENDOSCOPY;  Service: Cardiovascular;  Laterality: N/A;  . TEE WITHOUT CARDIOVERSION N/A 02/16/2012   Procedure: TRANSESOPHAGEAL ECHOCARDIOGRAM (TEE);  Surgeon: Fay Records, MD;  Location: [place];  Service: Cardiovascular;  Laterality: N/A;  Rm 6710  . THROMBECTOMY AND REVISION OF ARTERIOVENTOUS (AV) GORETEX  GRAFT Right 02/13/2012   Procedure: THROMBECTOMY AND REVISION OF ARTERIOVENTOUS (AV) GORETEX  GRAFT;  Surgeon: Angelia Mould, MD;  Location: [place];  Service: Vascular;  Laterality: Right;  . TRANSTHORACIC ECHOCARDIOGRAM  08/27/2011   EF 35-40%, mild-moderate concetric hypertrophy of the LV, moderately reduced LV systolic function,  . VASCULAR SURGERY       A IV Location/Drains/Wounds Patient Lines/Drains/Airways Status   Active Line/Drains/Airways    Name:   Placement date:   Placement time:   Site:   Days:   Peripheral IV 03/03/18 Right Hand   03/03/18    0920    Hand   less than 1   Vascular Access Right Upper arm Arteriovenous vein graft   09/01/11    1310    Upper arm   2375   Vascular Access   -    -    -       Vascular Access Left Thigh Arteriovenous vein graft   05/03/12    1118    Thigh   2130   Fistula / Graft Left Thigh Arteriovenous fistula   -    -    Thigh      Fistula / Graft Left Thigh Arteriovenous vein graft   -    -    Thigh             Intake/Output Last 24 hours No intake or output data in the 24 hours ending 03/03/18 1219  Labs/Imaging Results for orders placed or performed during the hospital encounter of 03/03/18 (from the past 48 hour(s))  CBC with Differential     Status: Abnormal   Collection Time: 03/03/18  8:59 AM  Result Value Ref Range   WBC 4.8 4.0 - 10.5 K/uL   RBC 2.33 (L) 3.87 - 5.11 MIL/uL   Hemoglobin 8.3 (L) 12.0 - 15.0 g/dL   HCT 27.1 (L) 36.0 -  46.0 %   MCV 116.3 (H) 80.0 - 100.0 fL   MCH 35.6 (H) 26.0 - 34.0 pg   MCHC 30.6 30.0 - 36.0 g/dL   RDW 13.1 11.5 - 15.5 %   Platelets 165 150 - 400 K/uL   nRBC 0.0 0.0 - 0.2 %   Neutrophils Relative % 72 %   Neutro Abs 3.4 1.7 - 7.7 K/uL   Lymphocytes Relative 20 %   Lymphs Abs 1.0 0.7 - 4.0 K/uL   Monocytes Relative 7 %   Monocytes Absolute 0.3 0.1 - 1.0 K/uL   Eosinophils Relative 1 %   Eosinophils Absolute 0.1 0.0 - 0.5 K/uL   Basophils Relative 0 %   Basophils Absolute 0.0 0.0 - 0.1 K/uL   Smear Review MORPHOLOGY UNREMARKABLE    Immature Granulocytes 0 %   Abs Immature Granulocytes 0.01 0.00 - 0.07 K/uL    Comment: Performed at [facility] [address] [place], [place] [address]  Basic metabolic panel     Status: Abnormal   Collection Time: 03/03/18  8:59 AM  Result Value Ref Range   Sodium 136 135 - 145 mmol/L   Potassium 4.8 3.5 - 5.1 mmol/L   Chloride 94 (L) 98 - 111 mmol/L   CO2 30 22 - 32 mmol/L   Glucose, Bld 82 70 - 99 mg/dL   BUN 51 (H) 8 - 23 mg/dL   Creatinine, Ser 6.01 (H) 0.44 - 1.00 mg/dL   Calcium 8.8 (L) 8.9 - 10.3 mg/dL   GFR calc non Af Amer 7 (L) >60 mL/min   GFR calc Af Amer 8 (L) >60 mL/min   Anion gap 12 5 - 15    Comment: Performed at [facility], [place] [address], [place], [place] [address]  Type and screen [place]     Status: None   Collection Time: 03/03/18  9:20 AM  Result Value Ref Range   ABO/RH(D) O POS    Antibody Screen NEG    Sample Expiration      03/06/2018 Performed at [facility], [place] [address] [place],  [address]    No results found.  Pending Labs Unresulted Labs (From admission, onward)   None      Vitals/Pain Today's Vitals   03/03/18 1115 03/03/18 1145 03/03/18 1200 03/03/18 1215  BP:  115/61 107/70 114/61  Pulse:  (!) 59  75  Resp:  13 12 16   Temp:      TempSrc:      SpO2:  100%  100%  Weight:      Height:      PainSc: 0-No pain       Isolation Precautions No active isolations  Medications Medications - No data to display  Mobility manual wheelchair     Focused Assessments    R Recommendations: See Admitting Provider Note  Report given to:   Additional Notes:  Pt continues to deny any abdominal pain.  No bowel movements since arriving in ED.  Sats dropped to 86% on RA, so pt was placed on 3L .  Sats of 100% presently.  Pt states she usually only uses O2 when she is at dialysis (T-H-S).

## 2018-03-03 NOTE — ED Notes (Signed)
Attempted report 

## 2018-03-04 DIAGNOSIS — D539 Nutritional anemia, unspecified: Secondary | ICD-10-CM | POA: Diagnosis not present

## 2018-03-04 DIAGNOSIS — I959 Hypotension, unspecified: Secondary | ICD-10-CM | POA: Diagnosis present

## 2018-03-04 DIAGNOSIS — E669 Obesity, unspecified: Secondary | ICD-10-CM

## 2018-03-04 DIAGNOSIS — B20 Human immunodeficiency virus [HIV] disease: Secondary | ICD-10-CM | POA: Diagnosis present

## 2018-03-04 DIAGNOSIS — N2581 Secondary hyperparathyroidism of renal origin: Secondary | ICD-10-CM | POA: Diagnosis present

## 2018-03-04 DIAGNOSIS — K2971 Gastritis, unspecified, with bleeding: Secondary | ICD-10-CM | POA: Diagnosis present

## 2018-03-04 DIAGNOSIS — N186 End stage renal disease: Secondary | ICD-10-CM

## 2018-03-04 DIAGNOSIS — Z992 Dependence on renal dialysis: Secondary | ICD-10-CM | POA: Diagnosis not present

## 2018-03-04 DIAGNOSIS — K644 Residual hemorrhoidal skin tags: Secondary | ICD-10-CM | POA: Diagnosis present

## 2018-03-04 DIAGNOSIS — I428 Other cardiomyopathies: Secondary | ICD-10-CM | POA: Diagnosis present

## 2018-03-04 DIAGNOSIS — K269 Duodenal ulcer, unspecified as acute or chronic, without hemorrhage or perforation: Secondary | ICD-10-CM | POA: Diagnosis not present

## 2018-03-04 DIAGNOSIS — I5022 Chronic systolic (congestive) heart failure: Secondary | ICD-10-CM | POA: Diagnosis present

## 2018-03-04 DIAGNOSIS — F0280 Dementia in other diseases classified elsewhere without behavioral disturbance: Secondary | ICD-10-CM | POA: Diagnosis present

## 2018-03-04 DIAGNOSIS — R195 Other fecal abnormalities: Secondary | ICD-10-CM

## 2018-03-04 DIAGNOSIS — I132 Hypertensive heart and chronic kidney disease with heart failure and with stage 5 chronic kidney disease, or end stage renal disease: Secondary | ICD-10-CM | POA: Diagnosis present

## 2018-03-04 DIAGNOSIS — K921 Melena: Secondary | ICD-10-CM | POA: Diagnosis present

## 2018-03-04 DIAGNOSIS — J9611 Chronic respiratory failure with hypoxia: Secondary | ICD-10-CM | POA: Diagnosis present

## 2018-03-04 DIAGNOSIS — D509 Iron deficiency anemia, unspecified: Secondary | ICD-10-CM | POA: Diagnosis not present

## 2018-03-04 DIAGNOSIS — K319 Disease of stomach and duodenum, unspecified: Secondary | ICD-10-CM | POA: Diagnosis present

## 2018-03-04 DIAGNOSIS — K641 Second degree hemorrhoids: Secondary | ICD-10-CM | POA: Diagnosis present

## 2018-03-04 DIAGNOSIS — D472 Monoclonal gammopathy: Secondary | ICD-10-CM | POA: Diagnosis present

## 2018-03-04 DIAGNOSIS — E8889 Other specified metabolic disorders: Secondary | ICD-10-CM | POA: Diagnosis present

## 2018-03-04 DIAGNOSIS — D120 Benign neoplasm of cecum: Secondary | ICD-10-CM | POA: Diagnosis not present

## 2018-03-04 DIAGNOSIS — D62 Acute posthemorrhagic anemia: Secondary | ICD-10-CM | POA: Diagnosis present

## 2018-03-04 DIAGNOSIS — K264 Chronic or unspecified duodenal ulcer with hemorrhage: Secondary | ICD-10-CM | POA: Diagnosis present

## 2018-03-04 DIAGNOSIS — K922 Gastrointestinal hemorrhage, unspecified: Secondary | ICD-10-CM | POA: Diagnosis not present

## 2018-03-04 DIAGNOSIS — K449 Diaphragmatic hernia without obstruction or gangrene: Secondary | ICD-10-CM | POA: Diagnosis present

## 2018-03-04 DIAGNOSIS — K2970 Gastritis, unspecified, without bleeding: Secondary | ICD-10-CM | POA: Diagnosis not present

## 2018-03-04 LAB — PREPARE RBC (CROSSMATCH)

## 2018-03-04 LAB — CBC
HCT: 21.7 % — ABNORMAL LOW (ref 36.0–46.0)
HCT: 25.4 % — ABNORMAL LOW (ref 36.0–46.0)
Hemoglobin: 6.9 g/dL — CL (ref 12.0–15.0)
Hemoglobin: 8.1 g/dL — ABNORMAL LOW (ref 12.0–15.0)
MCH: 36.5 pg — ABNORMAL HIGH (ref 26.0–34.0)
MCH: 34.9 pg — ABNORMAL HIGH (ref 26.0–34.0)
MCHC: 31.8 g/dL (ref 30.0–36.0)
MCHC: 31.9 g/dL (ref 30.0–36.0)
MCV: 114.8 fL — ABNORMAL HIGH (ref 80.0–100.0)
MCV: 109.5 fL — ABNORMAL HIGH (ref 80.0–100.0)
Platelets: 142 10*3/uL — ABNORMAL LOW (ref 150–400)
Platelets: 165 10*3/uL (ref 150–400)
RBC: 1.89 MIL/uL — ABNORMAL LOW (ref 3.87–5.11)
RBC: 2.32 MIL/uL — ABNORMAL LOW (ref 3.87–5.11)
RDW: 13.1 % (ref 11.5–15.5)
RDW: 16.8 % — ABNORMAL HIGH (ref 11.5–15.5)
WBC: 4.2 10*3/uL (ref 4.0–10.5)
WBC: 5.3 10*3/uL (ref 4.0–10.5)
nRBC: 0 % (ref 0.0–0.2)
nRBC: 0 % (ref 0.0–0.2)

## 2018-03-04 LAB — BASIC METABOLIC PANEL
Anion gap: 11 (ref 5–15)
BUN: 72 mg/dL — ABNORMAL HIGH (ref 8–23)
CO2: 29 mmol/L (ref 22–32)
Calcium: 8.7 mg/dL — ABNORMAL LOW (ref 8.9–10.3)
Chloride: 97 mmol/L — ABNORMAL LOW (ref 98–111)
Creatinine, Ser: 7.45 mg/dL — ABNORMAL HIGH (ref 0.44–1.00)
GFR calc Af Amer: 6 mL/min — ABNORMAL LOW (ref >60)
GFR calc non Af Amer: 5 mL/min — ABNORMAL LOW (ref >60)
Glucose, Bld: 78 mg/dL (ref 70–99)
Potassium: 4.6 mmol/L (ref 3.5–5.1)
Sodium: 137 mmol/L (ref 135–145)

## 2018-03-04 LAB — VITAMIN B12: Vitamin B-12: 374 pg/mL (ref 180–914)

## 2018-03-04 MED ORDER — CALCITRIOL 0.5 MCG PO CAPS
2.2500 ug | ORAL_CAPSULE | ORAL | Status: DC
  Administered 2018-03-06: 2.25 ug via ORAL
  Filled 2018-03-04 (×2): qty 1

## 2018-03-04 MED ORDER — CINACALCET HCL 30 MG PO TABS
90.0000 mg | ORAL_TABLET | Freq: Every evening | ORAL | Status: DC
  Administered 2018-03-05 – 2018-03-07 (×3): 90 mg via ORAL
  Filled 2018-03-04 (×4): qty 3

## 2018-03-04 MED ORDER — PEG-KCL-NACL-NASULF-NA ASC-C 100 G PO SOLR
0.5000 | Freq: Once | ORAL | Status: AC
  Administered 2018-03-04: 100 g via ORAL

## 2018-03-04 MED ORDER — METOCLOPRAMIDE HCL 5 MG/ML IJ SOLN
10.0000 mg | Freq: Once | INTRAMUSCULAR | Status: AC
  Administered 2018-03-04: 10 mg via INTRAVENOUS
  Filled 2018-03-04: qty 2

## 2018-03-04 MED ORDER — SODIUM CHLORIDE 0.9 % IV BOLUS
250.0000 mL | Freq: Once | INTRAVENOUS | Status: AC
  Administered 2018-03-04: 250 mL via INTRAVENOUS

## 2018-03-04 MED ORDER — SODIUM CHLORIDE 0.9% IV SOLUTION: Freq: Once | INTRAVENOUS | Status: DC

## 2018-03-04 MED ORDER — BISACODYL 5 MG PO TBEC
20.0000 mg | DELAYED_RELEASE_TABLET | Freq: Once | ORAL | Status: AC
  Administered 2018-03-04: 20 mg via ORAL
  Filled 2018-03-04: qty 4

## 2018-03-04 MED ORDER — METOCLOPRAMIDE HCL 5 MG/ML IJ SOLN: 10.0000 mg | Freq: Once | INTRAMUSCULAR | Status: DC

## 2018-03-04 MED ORDER — CHLORHEXIDINE GLUCONATE CLOTH 2 % EX PADS
6.0000 | MEDICATED_PAD | Freq: Every day | CUTANEOUS | Status: DC
  Administered 2018-03-04 – 2018-03-08 (×3): 6 via TOPICAL

## 2018-03-04 MED ORDER — PEG-KCL-NACL-NASULF-NA ASC-C 100 G PO SOLR
0.5000 | Freq: Once | ORAL | Status: AC
  Administered 2018-03-04: 100 g via ORAL
  Filled 2018-03-04 (×2): qty 1

## 2018-03-04 MED ORDER — PEG-KCL-NACL-NASULF-NA ASC-C 100 G PO SOLR: 1.0000 | Freq: Once | ORAL | Status: DC

## 2018-03-04 MED ORDER — PANTOPRAZOLE SODIUM 40 MG IV SOLR
40.0000 mg | Freq: Two times a day (BID) | INTRAVENOUS | Status: DC
  Administered 2018-03-04 – 2018-03-06 (×4): 40 mg via INTRAVENOUS
  Filled 2018-03-04 (×4): qty 40

## 2018-03-04 MED ORDER — SODIUM CHLORIDE 0.9% IV SOLUTION
Freq: Once | INTRAVENOUS | Status: AC
  Administered 2018-03-04: 09:00:00 via INTRAVENOUS

## 2018-03-04 MED ORDER — DARBEPOETIN ALFA 200 MCG/0.4ML IJ SOSY
200.0000 ug | PREFILLED_SYRINGE | INTRAMUSCULAR | Status: DC
  Administered 2018-03-05: 200 ug via INTRAVENOUS
  Filled 2018-03-04: qty 0.4

## 2018-03-04 NOTE — Progress Notes (Signed)
Patient admitted to room 5M19. Patient transferred via bed. Patient is alert and oriented x4. Oriented to room staff and unit. Dorthey Sawyer, RN

## 2018-03-04 NOTE — Progress Notes (Signed)
RN called report to 2M.

## 2018-03-04 NOTE — Consult Note (Addendum)
[facility] Renal Consultation Note    Indication for Consultation:  Management of ESRD/hemodialysis, anemia, hypertension/volume, and secondary hyperparathyroidism. PCP:  HPI: [name] is a 63 y.o. female with ESRD, HTN, HIV, MGUS, Hx endocarditis, and PVD who was admitted with rectal bleeding.  Pt was in usual state of health until morning of 2/26 when she woke with abd pain and passed large episode of bloody diarrhea. She presented to the ED for evaluation. Intake vitals were normal, labs showed Na 136, K 4.8, Ca 8.8, WBC 4.8, Hgb 8.3, plts 165. Denied fever, chills, CP, dyspnea. She was admitted initially for OBS.  Today, Hgb dropped further to 6.9 without overt bleeding. She was hypotensive and was given 553mL NS bolus. She endorses some mild dyspnea which is corrected with wearing nasal oxygen. Now admitted fully.  Dialyzes on TTS schedule at [facility] - last HD 2/25 which she completed in entirety. Uses L thigh AVG as her access. Today is her dialysis day - we were consulted for this purpose.  Past Medical History:  Diagnosis Date  . Anemia   . Bacteriuria, asymptomatic 04/04/10   Culture grew VRE sensitive to linesolid   . Cancer ([place])   . 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 Clostridium difficile infection 04/04/10   07/2011 and 08/2011  . Endocarditis    MSSA  . ESRD (end stage renal disease) ([place]) 03/19/2007   Started HD in 2010.  ESRD was due to HIV per pt. Gets HD on [employer] at [facility] on TTS schedule.     [name] History of bacteremia    MSSA; 05/2011, 08/2011  . Human immunodeficiency virus (HIV) disease ([place])   . Hypertension   . MGUS (monoclonal gammopathy of unknown significance)   . Peripheral vascular disease ([place])   . Septicemia due to enterococcus ([place])   . Systolic heart failure ([place])    08/27/11 EF 35-40%   Past Surgical History:  Procedure Laterality Date  . ABDOMINAL AORTAGRAM N/A  12/23/2010   Procedure: ABDOMINAL Maxcine Ham;  Surgeon: Angelia Mould, MD;  Location: [facility] CATH LAB;  Service: Cardiovascular;  Laterality: N/A;  . AV FISTULA PLACEMENT    . AV FISTULA PLACEMENT  09/01/2011   Procedure: INSERTION OF ARTERIOVENOUS (AV) GORE-TEX GRAFT ARM;  Surgeon: Rosetta Posner, MD;  Location: [facility] OR;  Service: Vascular;  Laterality: Right;  Using 52mm x 50  stretch goretex graft  . AV FISTULA PLACEMENT Left 05/03/2012   Procedure: INSERTION OF ARTERIOVENOUS (AV) GORE-TEX GRAFT THIGH USING 11mm X 70cm Gore-Tex Graft ;  Surgeon: Rosetta Posner, MD;  Location: [place];  Service: Vascular;  Laterality: Left;  . [place] REMOVAL Right 03/29/2012   Procedure: REMOVAL OF ARTERIOVENOUS GORETEX GRAFT ([place]);  Surgeon: Rosetta Posner, MD;  Location: [place];  Service: Vascular;  Laterality: Right;  . INSERTION OF DIALYSIS CATHETER  12/30/2010   Procedure: INSERTION OF DIALYSIS CATHETER;  Surgeon: Elam Dutch, MD;  Location: [place];  Service: Vascular;  Laterality: Left;  Insertion of left internal jugular dialysis catheter  . INSERTION OF DIALYSIS CATHETER  05/26/2011   Procedure: INSERTION OF DIALYSIS CATHETER;  Surgeon: Conrad [place], MD;  Location: [place];  Service: Vascular;  Laterality: N/A;  Insertion tunneled dialysis catheter in Right Internal Jugular with 23 cm catheter   . INSERTION OF DIALYSIS CATHETER  08/26/2011   Procedure: INSERTION OF DIALYSIS CATHETER;  Surgeon: Jessy Oto  Fields, MD;  Location: [place];  Service: Vascular;  Laterality: Right;  insertion of Right Femoral vein Dialysis catheter  . INSERTION OF DIALYSIS CATHETER N/A 03/29/2012   Procedure: INSERTION OF DIALYSIS CATHETER;  Surgeon: Rosetta Posner, MD;  Location: [place];  Service: Vascular;  Laterality: N/A;  Ultrasound guided  . LOWER EXTREMITY ANGIOGRAM  12/23/2010   Procedure: LOWER EXTREMITY ANGIOGRAM;  Surgeon: Angelia Mould, MD;  Location: [facility] CATH LAB;  Service: Cardiovascular;;  . TEE WITHOUT CARDIOVERSION   08/27/2011   Procedure: TRANSESOPHAGEAL ECHOCARDIOGRAM (TEE);  Surgeon: Pixie Casino, MD;  Location: [facility] ENDOSCOPY;  Service: Cardiovascular;  Laterality: N/A;  . TEE WITHOUT CARDIOVERSION N/A 02/16/2012   Procedure: TRANSESOPHAGEAL ECHOCARDIOGRAM (TEE);  Surgeon: Fay Records, MD;  Location: [place];  Service: Cardiovascular;  Laterality: N/A;  Rm [id number]  . THROMBECTOMY AND REVISION OF ARTERIOVENTOUS (AV) GORETEX  GRAFT Right 02/13/2012   Procedure: THROMBECTOMY AND REVISION OF ARTERIOVENTOUS (AV) GORETEX  GRAFT;  Surgeon: Angelia Mould, MD;  Location: [place];  Service: Vascular;  Laterality: Right;  . TRANSTHORACIC ECHOCARDIOGRAM  08/27/2011   EF 35-40%, mild-moderate concetric hypertrophy of the LV, moderately reduced LV systolic function,  . VASCULAR SURGERY     Family History  Problem Relation Age of Onset  . Kidney disease Mother   . Cancer Sister   . Heart disease Father   . Alcohol abuse Other        family h/o addiction/alcoholism  . Diabetes Cousin        first degree relatives  . Kidney disease Maternal Uncle    Social History:  reports that she has quit smoking. She has never used smokeless tobacco. She reports current drug use. Drug: Cocaine. She reports that she does not drink alcohol.  ROS: As per HPI otherwise negative.  Physical Exam: Vitals:   03/04/18 0300 03/04/18 0554 03/04/18 0849 03/04/18 0905  BP: 107/66 92/60 (!) 102/56 (!) 106/58  Pulse:  62 62 60  Resp:  14 14 14   Temp:  98.1 F (36.7 C) 98 F (36.7 C) 98.5 F (36.9 C)  TempSrc:  Oral Oral Oral  SpO2:  100% 98% 99%  Weight:      Height:         General: Well developed, well nourished, in no acute distress. Head: Normocephalic, atraumatic, sclera non-icteric, mucus membranes are moist. Neck: Supple without lymphadenopathy/masses. JVD not elevated. Lungs: Clear bilaterally to auscultation without wheezes, rales, or rhonchi. Breathing is unlabored. Heart: RRR, 4/6 holosystolic  murmur Abdomen: Soft, non-tender, non-distended with normoactive bowel sounds. No rebound/guarding.  Lower extremities: Trace B LE edema Neuro: Alert and oriented X 3. Moves all extremities spontaneously. Psych:  Responds to questions appropriately with a normal affect. Dialysis Access: L thigh AVG + bruit  Allergies  Allergen Reactions  . Penicillins Nausea And Vomiting and Rash    Has patient had a PCN reaction causing immediate rash, facial/tongue/throat swelling, SOB or lightheadedness with hypotension: Yes Has patient had a PCN reaction causing severe rash involving mucus membranes or skin necrosis: No Has patient had a PCN reaction that required hospitalization No Has patient had a PCN reaction occurring within the last 10 years: Yes If all of the above answers are "NO", then may proceed with Cephalosporin use.    Prior to Admission medications   Medication Sig Start Date End Date Taking? Authorizing Provider  albuterol (PROVENTIL HFA;VENTOLIN HFA) 108 (90 Base) MCG/ACT inhaler Inhale 2 puffs into the lungs every 6 (  six) hours as needed for wheezing or shortness of breath.   Yes [provider]  artificial tears (LACRILUBE) OINT ophthalmic ointment Place 1 application into the left eye at bedtime.   Yes [provider]  aspirin 81 MG chewable tablet Chew 81 mg by mouth daily at 12 noon.    Yes [provider]  cinacalcet (SENSIPAR) 30 MG tablet Take 30 mg by mouth every evening. Given at 1700   Yes [provider]  Darunavir-Cobicisctat-Emtricitabine-Tenofovir Alafenamide (SYMTUZA) 800-150-200-10 MG TABS Take 1 tablet by mouth daily with breakfast. Patient taking differently: Take 1 tablet by mouth daily at 12 noon. 12 noon 10/05/17  Yes Carlyle Basques, MD  docusate sodium (COLACE) 100 MG capsule Take 1 capsule (100 mg total) by mouth 2 (two) times daily. Patient taking differently: Take 100 mg by mouth 2 (two) times daily. 12 noon and 2100 07/20/15   Yes Hosie Poisson, MD  dolutegravir (TIVICAY) 50 MG tablet Take 1 tablet (50 mg total) by mouth daily. Patient taking differently: Take 50 mg by mouth daily at 12 noon. 12 noon 10/05/17  Yes Carlyle Basques, MD  ferric citrate (AURYXIA) 1 GM 210 MG(Fe) tablet Take 420 mg by mouth 3 (three) times daily with meals.   Yes [provider]  guaiFENesin (MUCINEX) 600 MG 12 hr tablet Take 600 mg by mouth 2 (two) times daily.   Yes [provider]  lidocaine-prilocaine (EMLA) cream Apply 1 application topically as needed. Dialysis days 02/25/18  Yes [provider]  midodrine (PROAMATINE) 10 MG tablet Take 10 mg by mouth daily at 12 noon. 12 noon   Yes [provider]  Multiple Vitamins-Minerals (CERTAGEN PO) Take 1 tablet by mouth daily at 12 noon.   Yes [provider]  Nutritional Supplements (FEEDING SUPPLEMENT, NEPRO CARB STEADY,) LIQD Take 237 mLs by mouth 3 (three) times daily as needed (Supplement). 07/20/15  Yes Hosie Poisson, MD  sucroferric oxyhydroxide (VELPHORO) 500 MG chewable tablet Chew 1,000 mg by mouth 3 (three) times daily with meals.   Yes [provider]   Current Facility-Administered Medications  Medication Dose Route Frequency Provider Last Rate Last Dose  . acetaminophen (TYLENOL) tablet 650 mg  650 mg Oral Q6H PRN Karmen Bongo, MD       Or  . acetaminophen (TYLENOL) suppository 650 mg  650 mg Rectal Q6H PRN Karmen Bongo, MD      . albuterol (PROVENTIL) (2.5 MG/3ML) 0.083% nebulizer solution 3 mL  3 mL Inhalation Q6H PRN Karmen Bongo, MD      . artificial tears (LACRILUBE) ophthalmic ointment 1 application  1 application Left Eye Ivery Quale, MD      . calcium carbonate (dosed in mg elemental calcium) suspension 500 mg of elemental calcium  500 mg of elemental calcium Oral Q6H PRN Karmen Bongo, MD      . camphor-menthol [facility]) lotion 1 application  1 application Topical E9F PRN Karmen Bongo, MD       And   . hydrOXYzine (ATARAX/VISTARIL) tablet 25 mg  25 mg Oral Q8H PRN Karmen Bongo, MD      . Chlorhexidine Gluconate Cloth 2 % PADS 6 each  6 each Topical Q0600 Loren Racer, PA-C      . cinacalcet (SENSIPAR) tablet 30 mg  30 mg Oral QPM Karmen Bongo, MD      . Darunavir-Cobicisctat-Emtricitabine-Tenofovir Alafenamide (SYMTUZA) 800-150-200-10 MG TABS 1 tablet  1 tablet Oral Q1200 Karmen Bongo, MD   1 tablet at  03/03/18 1356  . docusate sodium (COLACE) capsule 100 mg  100 mg Oral BID Karmen Bongo, MD   100 mg at 03/03/18 1357  . docusate sodium (ENEMEEZ) enema 283 mg  1 enema Rectal PRN Karmen Bongo, MD      . dolutegravir (TIVICAY) tablet 50 mg  50 mg Oral Q1200 Karmen Bongo, MD   50 mg at 03/03/18 1357  . feeding supplement (NEPRO CARB STEADY) liquid 237 mL  237 mL Oral TID PRN Karmen Bongo, MD      . ferric citrate (AURYXIA) tablet 420 mg  420 mg Oral TID WC Karmen Bongo, MD   420 mg at 03/04/18 0902  . midodrine (PROAMATINE) tablet 10 mg  10 mg Oral Q1200 Karmen Bongo, MD   10 mg at 03/03/18 1357  . ondansetron (ZOFRAN) tablet 4 mg  4 mg Oral Q6H PRN Karmen Bongo, MD       Or  . ondansetron [facility]) injection 4 mg  4 mg Intravenous Q6H PRN Karmen Bongo, MD      . pantoprazole (PROTONIX) injection 40 mg  40 mg Intravenous Q12H Smith, Rondell A, MD      . sorbitol 70 % solution 30 mL  30 mL Oral PRN Karmen Bongo, MD      . sucroferric oxyhydroxide [facility]) chewable tablet 1,000 mg  1,000 mg Oral TID WC Karmen Bongo, MD      . zolpidem [facility]) tablet 5 mg  5 mg Oral QHS PRN Karmen Bongo, MD       Labs: Basic Metabolic Panel: Recent Labs  Lab 03/03/18 0859 03/04/18 0612  NA 136 137  K 4.8 4.6  CL 94* 97*  CO2 30 29  GLUCOSE 82 78  BUN 51* 72*  CREATININE 6.01* 7.45*  CALCIUM 8.8* 8.7*   CBC: Recent Labs  Lab 03/03/18 0859 03/04/18 0612  WBC 4.8 4.2  NEUTROABS 3.4  --   HGB 8.3* 6.9*  HCT 27.1* 21.7*  MCV 116.3* 114.8*  PLT 165  142*   Dialysis Orders:  TTS at [facility] 4hr, 400/A1.5, EDW 104.5kg, 2K/2Ca, AVG, UF #2, heparin 5900u bolus - Aranesp 42mcg IV weekly (last 2/20) - Venofer 50mg  IV weekly - Calcitriol 2.63mcg PO q HD  - Home BMM meds: Sensipar 90, Velphoro 2/meals.  Assessment/Plan: 1.  Rectal bleeding: Pt endorses only 1 episode at home prior to arrival. No current abdominal pain. Hgb down to 6.9 this morning - ordered for 2U PRBCs - can give on dialysis today. Plan per hospitalist. 2.  ESRD: Usual TTS schedule - for HD today. C/o mild dyspnea - likely more related to anemia than fluid - UF as tolerated. 3.  Hypotension/volume: BP low this morning - required 0.5L fluids with improvement. No BP meds at home - takes midodrine 10mg  daily. 4.  Anemia (ABLA): See #1. For transfusion today. Due for ESA - will ^ Aranesp and give today. 5.  Metabolic bone disease: Ca ok, Phos/ALb pending. Continue home meds (Velphoro/Sensipar/Calc) - keep in mind that velphoro is iron-based and may cause dark stools. 6.  HIV: Home meds per primary.  ADDENDUM (13:27p): Pt just passed large blackish liquid stool while on HD. UF goal reduced to "none" - 1U PRBCs already given on floor, will complete 2nd unit while on HD. [place], PA-C 03/04/2018, 11:38 AM  [facility] Pager: [phone]

## 2018-03-04 NOTE — Consult Note (Addendum)
[facility] Consult: 11:23 AM 03/04/2018  LOS: 0 days    Referring Provider: Dr Fuller Plan  Primary Care Physician:  Patient, No Pcp Per Primary Gastroenterologist: Althia Forts.    Reason for Consultation: GI bleed.   HPI: [name] is a 63 y.o. female.  PMH HIV.  MGUS.  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  Systolic heart failure, EF 35- 40%.  MSSA endocarditis.  ESRD since 2010, TTS hemodialysis.  Anemia, macrocytic.  Gallstones on CT in 2014.  C. difficile infection in 2013.  s/p multiple procedures for HD access.    No prior colonoscopy or EGD.  Patient normally has brown stools just about every day.  Has never seen blood.  Good appetite.  No nausea or vomiting.  No abdominal pain.  No unexplained weight loss. Early yesterday morning she passed the first of several bloody stools.  She tells me at first that it was brown mixed with blood but later tells me it was dark.  It is certainly liquid and dark today.  She is not had any rectal or abdominal pain.  No nausea or vomiting.  Did not get dizzy. She took full dose Bufferin on Tuesday.  She uses ibuprofen maybe a few times a month at most.  Has not had any unusual bleeding or excessive bruising other than a sore on her left ear which when she scratches will bleed a small amount.  Does not recall previous blood transfusions.  Hgb 8.3 >> 6.9.  It was 9.8 in September thousand 19, 11.2 in early December 2019 MCV is 116.  Do not see any testing for B12 or folate. Platelets slightly low at 142, venously normal.  Fm Hx of cancer in her sister.  The patient does not know what kind this was.  Patient not aware of any history of GI bleeding in her family.  Past Medical History:  Diagnosis Date  . Anemia   . Bacteriuria, asymptomatic 04/04/10   Culture  grew VRE sensitive to linesolid   . Cancer ([place])   . Cardiomyopathy ([place])    Felt to be most likely a HIV nonischemic cardiomyopathy  . Clostridium difficile infection 04/04/10   07/2011 and 08/2011  . Endocarditis    MSSA  . ESRD (end stage renal disease) ([place]) 03/19/2007   Started HD in 2010.  ESRD was due to HIV per pt. Gets HD on [employer] at [facility] on TTS schedule.     [name] History of bacteremia    MSSA; 05/2011, 08/2011  . Human immunodeficiency virus (HIV) disease ([place])   . Hypertension   . MGUS (monoclonal gammopathy of unknown significance)   . Peripheral vascular disease ([place])   . Septicemia due to enterococcus ([place])   . Systolic heart failure ([place])    08/27/11 EF 35-40%    Past Surgical History:  Procedure Laterality Date  . ABDOMINAL AORTAGRAM N/A 12/23/2010   Procedure: ABDOMINAL Maxcine Ham;  Surgeon: Angelia Mould, MD;  Location: [facility] CATH LAB;  Service: Cardiovascular;  Laterality: N/A;  . AV FISTULA PLACEMENT    .  AV FISTULA PLACEMENT  09/01/2011   Procedure: INSERTION OF ARTERIOVENOUS (AV) GORE-TEX GRAFT ARM;  Surgeon: Rosetta Posner, MD;  Location: [facility] OR;  Service: Vascular;  Laterality: Right;  Using 51mm x 50  stretch goretex graft  . AV FISTULA PLACEMENT Left 05/03/2012   Procedure: INSERTION OF ARTERIOVENOUS (AV) GORE-TEX GRAFT THIGH USING 31mm X 70cm Gore-Tex Graft ;  Surgeon: Rosetta Posner, MD;  Location: [place];  Service: Vascular;  Laterality: Left;  . [place] REMOVAL Right 03/29/2012   Procedure: REMOVAL OF ARTERIOVENOUS GORETEX GRAFT ([place]);  Surgeon: Rosetta Posner, MD;  Location: [place];  Service: Vascular;  Laterality: Right;  . INSERTION OF DIALYSIS CATHETER  12/30/2010   Procedure: INSERTION OF DIALYSIS CATHETER;  Surgeon: Elam Dutch, MD;  Location: [place];  Service: Vascular;  Laterality: Left;  Insertion of left internal jugular dialysis catheter  . INSERTION OF DIALYSIS CATHETER  05/26/2011   Procedure: INSERTION OF DIALYSIS CATHETER;  Surgeon: Conrad Octavia, MD;  Location: [place];  Service: Vascular;  Laterality: N/A;  Insertion tunneled dialysis catheter in Right Internal Jugular with 23 cm catheter   . INSERTION OF DIALYSIS CATHETER  08/26/2011   Procedure: INSERTION OF DIALYSIS CATHETER;  Surgeon: Elam Dutch, MD;  Location: [facility] OR;  Service: Vascular;  Laterality: Right;  insertion of Right Femoral vein Dialysis catheter  . INSERTION OF DIALYSIS CATHETER N/A 03/29/2012   Procedure: INSERTION OF DIALYSIS CATHETER;  Surgeon: Rosetta Posner, MD;  Location: [place];  Service: Vascular;  Laterality: N/A;  Ultrasound guided  . LOWER EXTREMITY ANGIOGRAM  12/23/2010   Procedure: LOWER EXTREMITY ANGIOGRAM;  Surgeon: Angelia Mould, MD;  Location: [facility] CATH LAB;  Service: Cardiovascular;;  . TEE WITHOUT CARDIOVERSION  08/27/2011   Procedure: TRANSESOPHAGEAL ECHOCARDIOGRAM (TEE);  Surgeon: Pixie Casino, MD;  Location: [facility] ENDOSCOPY;  Service: Cardiovascular;  Laterality: N/A;  . TEE WITHOUT CARDIOVERSION N/A 02/16/2012   Procedure: TRANSESOPHAGEAL ECHOCARDIOGRAM (TEE);  Surgeon: Fay Records, MD;  Location: [place];  Service: Cardiovascular;  Laterality: N/A;  Rm [id number]  . THROMBECTOMY AND REVISION OF ARTERIOVENTOUS (AV) GORETEX  GRAFT Right 02/13/2012   Procedure: THROMBECTOMY AND REVISION OF ARTERIOVENTOUS (AV) GORETEX  GRAFT;  Surgeon: Angelia Mould, MD;  Location: [place];  Service: Vascular;  Laterality: Right;  . TRANSTHORACIC ECHOCARDIOGRAM  08/27/2011   EF 35-40%, mild-moderate concetric hypertrophy of the LV, moderately reduced LV systolic function,  . VASCULAR SURGERY      Prior to Admission medications   Medication Sig Start Date End Date Taking? Authorizing Provider  albuterol (PROVENTIL HFA;VENTOLIN HFA) 108 (90 Base) MCG/ACT inhaler Inhale 2 puffs into the lungs every 6 (six) hours as needed for wheezing or shortness of breath.   Yes [provider]  artificial tears (LACRILUBE) OINT ophthalmic ointment Place 1  application into the left eye at bedtime.   Yes [provider]  aspirin 81 MG chewable tablet Chew 81 mg by mouth daily at 12 noon.    Yes [provider]  cinacalcet (SENSIPAR) 30 MG tablet Take 30 mg by mouth every evening. Given at 1700   Yes [provider]  Darunavir-Cobicisctat-Emtricitabine-Tenofovir Alafenamide (SYMTUZA) 800-150-200-10 MG TABS Take 1 tablet by mouth daily with breakfast. Patient taking differently: Take 1 tablet by mouth daily at 12 noon. 12 noon 10/05/17  Yes Carlyle Basques, MD  docusate sodium (COLACE) 100 MG capsule Take 1 capsule (100 mg total) by mouth 2 (two) times daily. Patient taking  differently: Take 100 mg by mouth 2 (two) times daily. 12 noon and 2100 07/20/15  Yes Hosie Poisson, MD  dolutegravir (TIVICAY) 50 MG tablet Take 1 tablet (50 mg total) by mouth daily. Patient taking differently: Take 50 mg by mouth daily at 12 noon. 12 noon 10/05/17  Yes Carlyle Basques, MD  ferric citrate (AURYXIA) 1 GM 210 MG(Fe) tablet Take 420 mg by mouth 3 (three) times daily with meals.   Yes [provider]  guaiFENesin (MUCINEX) 600 MG 12 hr tablet Take 600 mg by mouth 2 (two) times daily.   Yes [provider]  lidocaine-prilocaine (EMLA) cream Apply 1 application topically as needed. Dialysis days 02/25/18  Yes [provider]  midodrine (PROAMATINE) 10 MG tablet Take 10 mg by mouth daily at 12 noon. 12 noon   Yes [provider]  Multiple Vitamins-Minerals (CERTAGEN PO) Take 1 tablet by mouth daily at 12 noon.   Yes [provider]  Nutritional Supplements (FEEDING SUPPLEMENT, NEPRO CARB STEADY,) LIQD Take 237 mLs by mouth 3 (three) times daily as needed (Supplement). 07/20/15  Yes Hosie Poisson, MD  sucroferric oxyhydroxide (VELPHORO) 500 MG chewable tablet Chew 1,000 mg by mouth 3 (three) times daily with meals.   Yes [provider]    Scheduled Meds: . artificial tears  1 application Left Eye  QHS  . cinacalcet  30 mg Oral QPM  . Darunavir-Cobicisctat-Emtricitabine-Tenofovir Alafenamide  1 tablet Oral Q1200  . docusate sodium  100 mg Oral BID  . dolutegravir  50 mg Oral Q1200  . ferric citrate  420 mg Oral TID WC  . midodrine  10 mg Oral Q1200  . pantoprazole (PROTONIX) IV  40 mg Intravenous Q12H  . sucroferric oxyhydroxide  1,000 mg Oral TID WC   Infusions:  PRN Meds: acetaminophen **OR** acetaminophen, albuterol, calcium carbonate (dosed in mg elemental calcium), camphor-menthol **AND** hydrOXYzine, docusate sodium, feeding supplement (NEPRO CARB STEADY), ondansetron **OR** ondansetron (ZOFRAN) IV, sorbitol, zolpidem   Allergies as of 03/03/2018 - Review Complete 03/03/2018  Allergen Reaction Noted  . Penicillins Nausea And Vomiting and Rash 12/23/2010    Family History  Problem Relation Age of Onset  . Kidney disease Mother   . Cancer Sister   . Heart disease Father   . Alcohol abuse Other        family h/o addiction/alcoholism  . Diabetes Cousin        first degree relatives  . Kidney disease Maternal Uncle     Social History   Socioeconomic History  . Marital status: Single    Spouse name: Not on file  . Number of children: Not on file  . Years of education: Not on file  . Highest education level: Not on file  Occupational History  . Not on file  Social Needs  . Financial resource strain: Not on file  . Food insecurity:    Worry: Not on file    Inability: Not on file  . Transportation needs:    Medical: Not on file    Non-medical: Not on file  Tobacco Use  . 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 Smokeless tobacco: Never Used  . Tobacco comment: quit when she was diagnosed with HIV  Substance and Sexual Activity  . Alcohol use: No    Alcohol/week: 0.0 standard drinks  . Drug use: Yes    Types: Cocaine    Comment: quit 63yrs ago  . Sexual activity: Never    Birth control/protection: Post-menopausal  Lifestyle  .  Physical activity:    Days per  week: Not on file    Minutes per session: Not on file  . Stress: Not on file  Relationships  . Social connections:    Talks on phone: Not on file    Gets together: Not on file    Attends religious service: Not on file    Active member of club or organization: Not on file    Attends meetings of clubs or organizations: Not on file    Relationship status: Not on file  . Intimate partner violence:    Fear of current or ex partner: Not on file    Emotionally abused: Not on file    Physically abused: Not on file    Forced sexual activity: Not on file  Other Topics Concern  . Not on file  Social History Narrative   Lives with daughter(does not know dx). Oldest daughter does not dx.   Current partner of may years,? fidelity    REVIEW OF SYSTEMS: Constitutional: Per HPI.  Patient does not feel weak or fatigue. ENT:  No nose bleeds Pulm: No shortness of breath or cough. CV:  No palpitations, no LE edema.  No chest pain. GU:  No hematuria, no frequency GI: See HPI. Heme: The HPI. Transfusions: See HPI. Neuro:  No headaches, no peripheral tingling or numbness Derm:  No itching, no rash or sores.  Endocrine:  No sweats or chills.  No polyuria or dysuria Immunization: Did not ask her about recent vaccinations. Travel:  None beyond local counties in last few months.    PHYSICAL EXAM: Vital signs in last 24 hours: Vitals:   03/04/18 0849 03/04/18 0905  BP: (!) 102/56 (!) 106/58  Pulse: 62 60  Resp: 14 14  Temp: 98 F (36.7 C) 98.5 F (36.9 C)  SpO2: 98% 99%   Wt Readings from Last 3 Encounters:  03/03/18 107.1 kg  10/05/17 101.6 kg  10/20/16 94.3 kg    General: Obese, pleasant AAF whose speech is hard to understand. Head: No facial asymmetry or swelling.  No signs of head trauma. Eyes: No conjunctival pallor.  No scleral icterus.  EOMI. Ears: Not hard of hearing Nose: Discharge or congestion Mouth: Oral mucosa pink, moist, clear.  Tongue midline. Neck: No JVD, no  masses, no thyromegaly. Lungs: Clear bilaterally.  No labored breathing or cough. Heart: Soft, 2/6 systolic murmur.  S1, S2 audible.  RRR Abdomen: Obese.  Soft.  No masses, HSM, bruits, hernias.  Active bowel sounds.  No distention.   Rectal: DRE not performed but patient got up from commode after a BM.  This stool is black/burgundy, liquid. Musc/Skeltl: No joint redness, swelling or gross deformity. Extremities: Nonpitting lower extremity edema.  Old hemodialysis fistulas in the arms.  Current fistula in the left thigh with good pulse. Neurologic: Alert.  Oriented x3.  Moves all 4 limbs.  No gross weakness or deficits. Skin: At least 100 hyperpigmented flat scars on her back.  She says these are sequela of chickenpox many years ago. Tattoos: None observed. Nodes: No cervical adenopathy. Psych: Pleasant, cooperative, calm.  Intake/Output from previous day: 02/26 0701 - 02/27 0700 In: 404.4 [IV Piggyback:404.4] Out: -  Intake/Output this shift: No intake/output data recorded.  LAB RESULTS: Recent Labs    03/03/18 0859 03/04/18 0612  WBC 4.8 4.2  HGB 8.3* 6.9*  HCT 27.1* 21.7*  PLT 165 142*   BMET Lab Results  Component Value Date   NA 137 03/04/2018  NA 136 03/03/2018   NA 139 12/09/2017   K 4.6 03/04/2018   K 4.8 03/03/2018   K 4.9 12/09/2017   CL 97 (L) 03/04/2018   CL 94 (L) 03/03/2018   CL 95 (L) 12/09/2017   CO2 29 03/04/2018   CO2 30 03/03/2018   CO2 36 (H) 12/09/2017   GLUCOSE 78 03/04/2018   GLUCOSE 82 03/03/2018   GLUCOSE 80 12/09/2017   BUN 72 (H) 03/04/2018   BUN 51 (H) 03/03/2018   BUN 34 (H) 12/09/2017   CREATININE 7.45 (H) 03/04/2018   CREATININE 6.01 (H) 03/03/2018   CREATININE 7.09 (H) 12/09/2017   CALCIUM 8.7 (L) 03/04/2018   CALCIUM 8.8 (L) 03/03/2018   CALCIUM 10.2 12/09/2017   LFT No results for input(s): PROT, ALBUMIN, AST, ALT, ALKPHOS, BILITOT, BILIDIR, IBILI in the last 72 hours. PT/INR Lab Results  Component Value Date   INR 1.19  11/12/2012   INR 1.60 (H) 05/18/2011   INR 1.32 12/24/2010   Hepatitis Panel No results for input(s): HEPBSAG, HCVAB, HEPAIGM, HEPBIGM in the last 72 hours. C-Diff No components found for: CDIFF Lipase     Component Value Date/Time   LIPASE 40 08/22/2011 0100    Drugs of Abuse  No results found for: LABOPIA, COCAINSCRNUR, LABBENZ, AMPHETMU, THCU, LABBARB   RADIOLOGY STUDIES: No results found.    IMPRESSION:   *     GI bleed.  Current dark stool looks like an upper GI bleed, however she does say that initially she had brown stool with some blood.  Has never undergone upper endoscopy or colonoscopy.  Does not use or require PPI or H2 blocker at home.  Infrequent use of full dose aspirin and ibuprofen.  Takes baby aspirin daily.  *     Macrocytic anemia. Acute blood loss anemia.  S/p 1 U PRBC just vompleted.    *     HIV.  On Symtuza, Tivicay.  Followed by Dr. Karolee Ohs.  MD note from 09/2017 relays patient with intermittent med nonadherence.  *    ESRD.  Dialyzes TTS.  *    History nonischemic cardiomyopathy with systolic heart failure.  EF of 35-40% in 2014.  Currently seems well compensated even in the setting of anemia.    PLAN:     *    Add B12 and folate testing to post transfusion CBC this afternoon.  Recheck CBC in the morning..  *    Continue BID, IV Protonix.  *    Discussed benefits, risks of EGD and colonoscopy with the patient and she is agreeable to proceed.  This is set up for 11 AM tomorrow.  Split dose movie prep, Dulcolax.   Azucena Freed  03/04/2018, 11:23 AM Phone [phone]

## 2018-03-04 NOTE — Progress Notes (Signed)
Patient had a low BP readings on 3 consecutive time: highest being 88/57 and lowest being 77/43 but asymtomatic. "On call attending" was notified and 250cc bolus of normal saline was ordered. Fluid was given there was no changes in blood pressure reading . A 2nd bolus of 250 normal saline ordered and after that patients BP was 107/ 66. Patient stable in bed. Will keep monitoring

## 2018-03-04 NOTE — Progress Notes (Signed)
CRITICAL VALUE ALERT  Critical Value:  Hemoglobin= 6.9  Date & Time Notied:  03/04/18 0731  Provider Notified: yes  Orders Received/Actions taken: no new orders

## 2018-03-04 NOTE — Progress Notes (Addendum)
Progress Note    [name]  KCL:[id number] DOB: [birth year]  DOA: 03/03/2018 PCP: Patient, No Pcp Per    Brief Narrative:   Chief complaint: GI bleed  Medical records reviewed and are as summarized below:  [name] is an 63 y.o. female with pmh of HIV, systolic CHF, MGUS, HTN, ESRD on HD (TTS), MSSA/enterococcus bacteremia; who presented with bright red blood per rectum from [name].   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  Hemoglobin dropped from 8.3-> 6.9.  Patient had already been typed and screened for possible need of blood products.  Unclear cause of bleeding at this time, but patient admits to intermittent use of ibuprofen.  Denies history of previous colonoscopy. -Strict I&O's -Clear liquid diet as tolerated -Protonix IV -Transfuse 2 unit of packed red blood cells now -Follow-up repeat H&H post transfusion -Consulted [facility], will follow for further recommendation -Bowel prep per GI with plan for colonoscopy/EGD in a.m. on 2/28  ESRD on HD: Patient normally dialyzes at [name] on T/T/Sat, and her nephrologist is [name].  She has a left thigh fistula.  Last dialyzed on 03/02/2018.  Unable to do hemodialysis on 2/27 due to repeated episodes of diarrhea following Colace. - HD per Nephrology consulted  HIV with dementia: Patient with previous history of medication noncompliance, but currently living in [name] for which she should be compliant.  Last CD4 T-cell count 210 with viral load undetectable on 12/09/2017.  Followed by [name] of infectious disease. -  continue Symtuza and Tivicay  Chronic respiratory failure on home O2: Patient reports being on baseline oxygen requirements of 2 L nasal cannula oxygen. -Continue nasal cannula oxygen  Intermittent hypotension: Blood pressure currently stable. -Continue Midodrine  Obesity: At baseline patient is wheelchair-bound.  BMI 39.29 kg/m  Body mass index is 39.29 kg/m.   Family Communication/Anticipated D/C date and plan/Code Status   DVT prophylaxis: SCDs Code Status: Full Code.  Family Communication: No family present at bedside Disposition Plan: Possible discharge home in 1 to 2 days   Medical Consultants:    Gastroenterology   Anti-Infectives:    None  Subjective:   This morning patient reported having another dark liquid bowel movement which would make her third since symptoms started.  Reports that she is previously taking ibuprofen only occasionally for aches and pains.  Denies any current abdominal pains.  Notified by hemodialysis staff at 1:24PM patient had several episodes of bloody diarrhea while trying to be dialyzed and therefore had to be stopped.   Objective:    Vitals:   03/04/18 0554 03/04/18 0849 03/04/18 0905 03/04/18 1150  BP: 92/60 (!) 102/56 (!) 106/58   Pulse: 62 62 60 61  Resp: _0 Temp: 98.1 F (36.7 C) 98 F (36.7 C) 98.5 F (36.9 C) 98.2 F (36.8 C)  TempSrc: Oral Oral Oral Oral  SpO2: 100% 98% 99% 100%  Weight:      Height:        Intake/Output Summary (Last 24 hours) at 03/04/2018 1254 Last data filed at 03/04/2018  0300 Gross per 24 hour  Intake 404.44 ml  Output -  Net 404.44 ml   Filed Weights   03/03/18 0842 03/03/18 1341  Weight: 103.4 kg 107.1 kg    Exam: Constitutional: NAD, calm, comfortable Eyes: PERRL, lids and conjunctivae normal ENMT: Mucous membranes are moist. Posterior pharynx clear of any exudate or lesions.Normal dentition.  Neck: normal, supple, no masses, no thyromegaly Respiratory: Patient on 2 L  nasal cannula oxygen with no significant wheezes or rhonchi noted.  Cardiovascular: Regular rate and rhythm, no murmurs / rubs / gallops. No extremity edema. 2+ pedal pulses. No carotid bruits.  Left thigh fistula present. Abdomen: no tenderness, no masses palpated. No hepatosplenomegaly. Bowel sounds positive.  Musculoskeletal: no clubbing / cyanosis. No joint deformity upper and lower extremities. Good ROM, no contractures. Normal muscle tone.  Skin: no rashes, lesions, ulcers. No induration Neurologic: CN 2-12 grossly intact. Sensation intact, DTR normal. Strength 5/5 in all 4.  Psychiatric: Normal judgment and insight. Alert and oriented x 3. Normal mood.    Data Reviewed:   I have personally reviewed following labs and imaging studies:  Labs: Labs show the following:   Basic Metabolic Panel: Recent Labs  Lab 03/03/18 0859 03/04/18 0612  NA 136 137  K 4.8 4.6  CL 94* 97*  CO2 30 29  GLUCOSE 82 78  BUN 51* 72*  CREATININE 6.01* 7.45*  CALCIUM 8.8* 8.7*   GFR Estimated Creatinine Clearance: 9.6 mL/min (A) (by C-G formula based on SCr of 7.45 mg/dL (H)). Liver Function Tests: No results for input(s): AST, ALT, ALKPHOS, BILITOT, PROT, ALBUMIN in the last 168 hours. No results for input(s): LIPASE, AMYLASE in the last 168 hours. No results for input(s): AMMONIA in the last 168 hours. Coagulation profile No results for input(s): INR, PROTIME in the last 168 hours.  CBC: Recent Labs  Lab 03/03/18 0859 03/04/18 0612  WBC 4.8 4.2  NEUTROABS 3.4  --   HGB 8.3* 6.9*  HCT 27.1* 21.7*  MCV 116.3* 114.8*  PLT 165 142*   Cardiac Enzymes: No results for input(s): CKTOTAL, CKMB, CKMBINDEX, TROPONINI in the last 168 hours. BNP (last 3 results) No results for input(s): PROBNP in the last 8760 hours. CBG: No results for input(s): GLUCAP in the last 168 hours. D-Dimer: No results for input(s): DDIMER in the last 72 hours. Hgb A1c: No results for input(s): HGBA1C in the last  72 hours. Lipid Profile: No results for input(s): CHOL, HDL, LDLCALC, TRIG, CHOLHDL, LDLDIRECT in the last 72 hours. Thyroid function studies: No results for input(s): TSH, T4TOTAL, T3FREE, THYROIDAB in the last 72 hours.  Invalid input(s): FREET3 Anemia work up: No results for input(s): VITAMINB12, FOLATE, FERRITIN, TIBC, IRON, RETICCTPCT in the last 72 hours. Sepsis Labs: Recent Labs  Lab 03/03/18 0859 03/04/18 0612  WBC 4.8 4.2    Microbiology No results found for this or any previous visit (from the past 240 hour(s)).  Procedures and diagnostic studies:  No results found.  Medications:   . artificial tears  1 application Left Eye QHS  . calcitRIOL  2.25 mcg Oral Once per day on Tue Thu Sat  . Chlorhexidine Gluconate Cloth  6 each Topical Q0600  . cinacalcet  90 mg Oral QPM  . darbepoetin (ARANESP) injection - DIALYSIS  200 mcg Intravenous Q Thu-HD  . Darunavir-Cobicisctat-Emtricitabine-Tenofovir Alafenamide  1 tablet Oral Q1200  . docusate sodium  100 mg Oral BID  . dolutegravir  50 mg Oral Q1200  . metoCLOPramide (REGLAN)  injection  10 mg Intravenous Once   Followed by  . metoCLOPramide (REGLAN) injection  10 mg Intravenous Once  . midodrine  10 mg Oral Q1200  . pantoprazole (PROTONIX) IV  40 mg Intravenous Q12H  . peg 3350 powder  0.5 kit Oral Once   And  . [START ON 03/05/2018] peg 3350 powder  0.5 kit Oral Once  . sucroferric oxyhydroxide  1,000 mg Oral TID WC   Continuous Infusions:   LOS: 0 days   Rondell A Smith  [facility]   *Please refer to [email or web address], password [email or web address] to get updated schedule on who will round on this patient, as hospitalists switch teams weekly. If 7PM-7AM, please contact night-coverage at [email or web address], password [email or web address] for any overnight needs.

## 2018-03-05 ENCOUNTER — Inpatient Hospital Stay (HOSPITAL_COMMUNITY): Payer: Medicaid Other | Admitting: Anesthesiology

## 2018-03-05 ENCOUNTER — Encounter (HOSPITAL_COMMUNITY): Payer: Self-pay | Admitting: *Deleted

## 2018-03-05 ENCOUNTER — Encounter (HOSPITAL_COMMUNITY)
Admission: EM | Disposition: A | Discharge status: Skilled Nursing Facility | Payer: Self-pay | Source: Skilled Nursing Facility | Attending: Family Medicine

## 2018-03-05 DIAGNOSIS — K922 Gastrointestinal hemorrhage, unspecified: Secondary | ICD-10-CM

## 2018-03-05 DIAGNOSIS — D120 Benign neoplasm of cecum: Secondary | ICD-10-CM

## 2018-03-05 DIAGNOSIS — D509 Iron deficiency anemia, unspecified: Secondary | ICD-10-CM

## 2018-03-05 DIAGNOSIS — K269 Duodenal ulcer, unspecified as acute or chronic, without hemorrhage or perforation: Secondary | ICD-10-CM

## 2018-03-05 DIAGNOSIS — K2970 Gastritis, unspecified, without bleeding: Secondary | ICD-10-CM

## 2018-03-05 HISTORY — PX: BIOPSY: SHX5522

## 2018-03-05 HISTORY — PX: COLONOSCOPY WITH PROPOFOL: SHX5780

## 2018-03-05 HISTORY — PX: POLYPECTOMY: SHX5525

## 2018-03-05 HISTORY — PX: ESOPHAGOGASTRODUODENOSCOPY (EGD) WITH PROPOFOL: SHX5813

## 2018-03-05 LAB — RENAL FUNCTION PANEL
Albumin: 3.1 g/dL — ABNORMAL LOW (ref 3.5–5.0)
Anion gap: 13 (ref 5–15)
BUN: 81 mg/dL — ABNORMAL HIGH (ref 8–23)
CO2: 22 mmol/L (ref 22–32)
Calcium: 8.9 mg/dL (ref 8.9–10.3)
Chloride: 99 mmol/L (ref 98–111)
Creatinine, Ser: 9.33 mg/dL — ABNORMAL HIGH (ref 0.44–1.00)
GFR calc Af Amer: 5 mL/min — ABNORMAL LOW (ref >60)
GFR calc non Af Amer: 4 mL/min — ABNORMAL LOW (ref >60)
Glucose, Bld: 76 mg/dL (ref 70–99)
Phosphorus: 9 mg/dL — ABNORMAL HIGH (ref 2.5–4.6)
Potassium: 5.4 mmol/L — ABNORMAL HIGH (ref 3.5–5.1)
Sodium: 134 mmol/L — ABNORMAL LOW (ref 135–145)

## 2018-03-05 LAB — CBC
HCT: 28.5 % — ABNORMAL LOW (ref 36.0–46.0)
Hemoglobin: 9 g/dL — ABNORMAL LOW (ref 12.0–15.0)
MCH: 34.4 pg — ABNORMAL HIGH (ref 26.0–34.0)
MCHC: 31.6 g/dL (ref 30.0–36.0)
MCV: 108.8 fL — ABNORMAL HIGH (ref 80.0–100.0)
Platelets: 154 10*3/uL (ref 150–400)
RBC: 2.62 MIL/uL — ABNORMAL LOW (ref 3.87–5.11)
RDW: 18.5 % — ABNORMAL HIGH (ref 11.5–15.5)
WBC: 4.6 10*3/uL (ref 4.0–10.5)
nRBC: 0 % (ref 0.0–0.2)

## 2018-03-05 LAB — FOLATE: Folate: 15.1 ng/mL (ref >5.9)

## 2018-03-05 LAB — FOLATE RBC
Folate, Hemolysate: 307 ng/mL
Folate, RBC: 1352 ng/mL (ref >498)
Hematocrit: 22.7 % — ABNORMAL LOW (ref 34.0–46.6)

## 2018-03-05 LAB — MRSA PCR SCREENING: MRSA by PCR: NEGATIVE

## 2018-03-05 SURGERY — COLONOSCOPY WITH PROPOFOL
Anesthesia: Monitor Anesthesia Care

## 2018-03-05 MED ORDER — PHENYLEPHRINE 40 MCG/ML (10ML) SYRINGE FOR IV PUSH (FOR BLOOD PRESSURE SUPPORT)
PREFILLED_SYRINGE | INTRAVENOUS | Status: DC | PRN
  Administered 2018-03-05 (×2): 120 ug via INTRAVENOUS
  Administered 2018-03-05 (×2): 80 ug via INTRAVENOUS

## 2018-03-05 MED ORDER — ONDANSETRON HCL 4 MG/2ML IJ SOLN
INTRAMUSCULAR | Status: DC | PRN
  Administered 2018-03-05: 4 mg via INTRAVENOUS

## 2018-03-05 MED ORDER — PROPOFOL 500 MG/50ML IV EMUL
INTRAVENOUS | Status: DC | PRN
  Administered 2018-03-05: 150 ug/kg/min via INTRAVENOUS

## 2018-03-05 MED ORDER — SODIUM CHLORIDE 0.9 % IV SOLN: 100.0000 mL | INTRAVENOUS | Status: DC | PRN

## 2018-03-05 MED ORDER — LIDOCAINE HCL (PF) 1 % IJ SOLN: 5.0000 mL | INTRAMUSCULAR | Status: DC | PRN

## 2018-03-05 MED ORDER — LIDOCAINE-PRILOCAINE 2.5-2.5 % EX CREA: 1.0000 "application " | TOPICAL_CREAM | CUTANEOUS | Status: DC | PRN

## 2018-03-05 MED ORDER — RENA-VITE PO TABS
1.0000 | ORAL_TABLET | Freq: Every day | ORAL | Status: DC
  Administered 2018-03-05 – 2018-03-07 (×3): 1 via ORAL
  Filled 2018-03-05 (×3): qty 1

## 2018-03-05 MED ORDER — MIDODRINE HCL 5 MG PO TABS
ORAL_TABLET | ORAL | Status: AC
  Filled 2018-03-05: qty 2

## 2018-03-05 MED ORDER — DARBEPOETIN ALFA 200 MCG/0.4ML IJ SOSY
PREFILLED_SYRINGE | INTRAMUSCULAR | Status: AC
  Administered 2018-03-05: 200 ug via INTRAVENOUS
  Filled 2018-03-05: qty 0.4

## 2018-03-05 MED ORDER — LIDOCAINE 2% (20 MG/ML) 5 ML SYRINGE
INTRAMUSCULAR | Status: DC | PRN
  Administered 2018-03-05: 100 mg via INTRAVENOUS

## 2018-03-05 MED ORDER — SODIUM CHLORIDE 0.9 % IV SOLN
INTRAVENOUS | Status: AC | PRN
  Administered 2018-03-05: 500 mL via INTRAVENOUS

## 2018-03-05 MED ORDER — PROPOFOL 10 MG/ML IV BOLUS
INTRAVENOUS | Status: DC | PRN
  Administered 2018-03-05 (×2): 20 mg via INTRAVENOUS

## 2018-03-05 MED ORDER — PENTAFLUOROPROP-TETRAFLUOROETH EX AERO: 1.0000 "application " | INHALATION_SPRAY | CUTANEOUS | Status: DC | PRN

## 2018-03-05 SURGICAL SUPPLY — 25 items

## 2018-03-05 NOTE — Anesthesia Postprocedure Evaluation (Signed)
Anesthesia Post Note  Patient: [name]  Procedure(s) Performed: COLONOSCOPY WITH PROPOFOL (N/A ) ESOPHAGOGASTRODUODENOSCOPY (EGD) WITH PROPOFOL (N/A ) BIOPSY HEMOSTASIS CLIP PLACEMENT POLYPECTOMY     Patient location during evaluation: Endoscopy Anesthesia Type: MAC Level of consciousness: awake and alert Pain management: pain level controlled Vital Signs Assessment: post-procedure vital signs reviewed and stable Respiratory status: spontaneous breathing, nonlabored ventilation, respiratory function stable and patient connected to nasal cannula oxygen Cardiovascular status: blood pressure returned to baseline and stable Postop Assessment: no apparent nausea or vomiting Anesthetic complications: no    Last Vitals:  Vitals:   03/05/18 1130 03/05/18 1145  BP: 109/64   Pulse: 70   Resp: 16 14  Temp:    SpO2: 100% 100%    Last Pain:  Vitals:   03/05/18 1200  TempSrc:   PainSc: 0-No pain                 Haygen Zebrowski S

## 2018-03-05 NOTE — Progress Notes (Signed)
O2 sats 74% on room air on arrival to endoscopy for EGD/Colonoscopy. Placed on 4L [place] sats increased to 100% after 2 minutes. O2 decreased to 2L [place], Dr. Marcie Bal advised, he will come see patient.

## 2018-03-05 NOTE — Procedures (Signed)
I was present at this session.  I have reviewed the session itself and made appropriate changes.  HD via L groin avg, bp in low 100s, tol well. Access press ok.  Jeneen Rinks Jaydee Conran 2/28/20202:28 PM

## 2018-03-05 NOTE — Progress Notes (Signed)
PROGRESS NOTE    [name]  PPJ:[id number] DOB: [birth year] DOA: 03/03/2018 PCP: Patient, No Pcp Per   Brief Narrative:  [name] is an 63 y.o. female with pmh of HIV, systolic CHF, MGUS, HTN, ESRD on HD (TTS), MSSA/enterococcus bacteremia; who presented with bright red blood per rectum from [place] facility.   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  Pt received 3 units pRBC on 2/27. Now s/p EGD/Colonoscopy with GI -  EGD notable for gastritis, nonbleeding erosive gastropathy, 1 nonbleeding duodenal ulcer with clean base, erythematous duodenopathy.  Recommending BID PPI x 2-3 months.  Follow up biopsies taken for H pylori.  Needs f/u EGD in 2-3 months to ensure healing of erosive gastropathy and duodenal ulcer.  Colonoscopy notable for nonthrombosed external hemorrhoids, 4 mm polyp removed with cold snare, non bleeding, non thrombosed, external and internal hemorrhoids. Follow up pathology Follow up repeat colonoscopy in 1 year due to poor prep Follow up with primary GI in [place] or here in [place] per pt preference Continue PPI BID Continue following H/H   ESRD on HD: Patient normally dialyzes at [facility] on T/T/Sat, and her nephrologist is Dr. Grandville Silos.  She has Juneau Doughman left thigh fistula.  Dialyzed today, 2/28. - HD per Nephrology consulted  HIV with dementia: Patient with previous history of medication noncompliance, but currently living in [facility] skilled nursing facility for which she should be compliant.  Last CD4 T-cell count 210 with viral load undetectable on 12/09/2017.  Followed by Dr. Karolee Ohs of infectious disease. - continue  Symtuza and Tivicay  Chronic respiratory failure on home O2: Patient reports being on baseline oxygen requirements of 2 L nasal cannula oxygen. -Continue nasal cannula oxygen  Intermittent hypotension: Blood pressure currently stable. -Continue Midodrine  Obesity: At baseline patient is wheelchair-bound.  BMI 39.29 kg/m  Body mass index is 39.29 kg/m.   DVT prophylaxis: SCDs Code Status: full  Family Communication: none at bedside Disposition Plan: pending   Consultants:   GI  Nephrology  Procedures:  Colonoscopy Impression - Preparation of the colon was fair. - Non-thrombosed external hemorrhoids found on digital rectal exam. - Stool in the entire examined colon. - One 4 mm polyp in the cecum, removed with Travius Crochet cold snare. Resected and retrieved. Clips (MR conditional) were placed. - Non-bleeding non-thrombosed external and internal hemorrhoids. Recommendations - The patient will be observed post-procedure, until all discharge criteria are met. - Return patient to hospital ward for ongoing care. - Patient has Raphaella Larkin contact number available for emergencies. The signs and symptoms of potential delayed complications were discussed with the patient. Return to normal activities tomorrow. Written discharge instructions were provided to the patient. - Resume previous diet. - Continue present medications. - Await pathology results. - Repeat colonoscopy within 1 year because the bowel preparation was poor to ensure that no polyps are . - Return to GI clinic with primary GI in [place], or if decides to be seen in [place] we are happy to see. - BRBPR is most likely hemorrhoidal in nature and should be treated with Gawain Crombie high fiber diet and stool softeners and hemorrhoidal  ointment (Preparation H) and Sitz Bathes. - The findings and recommendations were discussed with the patient. - The findings and recommendations were discussed with the referring physician.  Upper  Endoscopy Impression - No gross lesions in esophagus. - 2 cm hiatal hernia. - Gastritis. Non-bleeding erosive gastropathy. Biopsied for HP. - One non-bleeding duodenal ulcer with Magali Bray clean ulcer base (Forrest Class III). - Erythematous duodenopathy. - Normal second portion of the duodenum. Recommendations - Proceed to scheduled colonoscopy. - Continue PPI BID x 2-3 months. - Await pathology results. - Would normally consider Jimya Ciani follow up EGD in 2-3 months to ensure healing of erosive gastropathy and duodenal ulcer. Will be happy to have her seen in [place] though she has Sharnise Blough GI MD in [place]. - The findings and recommendations were discussed with the patient. - The findings and recommendations were discussed with the referring physician.  Antimicrobials:  Anti-infectives (From admission, onward)   Start     Dose/Rate Route Frequency Ordered Stop   03/03/18 1315  Darunavir-Cobicisctat-Emtricitabine-Tenofovir Alafenamide (SYMTUZA) 800-150-200-10 MG TABS 1 tablet    Note to Pharmacy:  Patient taking differently: 12 noon     1 tablet Oral Daily 03/03/18 1308     03/03/18 1315  dolutegravir (TIVICAY) tablet 50 mg    Note to Pharmacy:  Patient taking differently: 12 noon     50 mg Oral Daily 03/03/18 1308           Subjective: Denies pain or complaint. Doing ok on dialysis  Objective: Vitals:   03/05/18 1430 03/05/18 1500 03/05/18 1530 03/05/18 1600  BP: (!) 92/52 (!) 82/55 (!) 101/54 (!) 86/55  Pulse: (!) 56 (!) 58 (!) 56 (!) 55  Resp:      Temp:      TempSrc:      SpO2:      Weight:      Height:        Intake/Output Summary (Last 24 hours) at 03/05/2018 1623 Last data filed at 03/05/2018 1200 Gross per 24 hour  Intake 520 ml  Output 0 ml  Net 520 ml   Filed Weights   03/04/18 1530 03/05/18 0943 03/05/18 1255  Weight: 105.7 kg 105.7 kg 106.8 kg    Examination:  General exam: Appears calm and comfortable on dialysis Respiratory system: Clear to auscultation. Respiratory  effort normal. Cardiovascular system: S1 & S2 heard, RRR. Gastrointestinal system: Abdomen is nondistended, soft and nontender Central nervous system: Alert and oriented. No focal neurological deficits. Skin: No rashes, lesions or ulcers Psychiatry: Judgement and insight appear normal. Mood & affect appropriate.     Data Reviewed: I have personally reviewed following labs and imaging studies  CBC: Recent Labs  Lab 03/03/18 0859 03/04/18 0612 03/04/18 1436 03/05/18 0428  WBC 4.8 4.2 5.3 4.6  NEUTROABS 3.4  --   --   --   HGB 8.3* 6.9* 8.1* 9.0*  HCT 27.1* 21.7* 25.4*  22.7* 28.5*  MCV 116.3* 114.8* 109.5* 108.8*  PLT 165 142* 165 197   Basic Metabolic Panel: Recent Labs  Lab 03/03/18 0859 03/04/18 0612 03/05/18 1314  NA 136 137 134*  K 4.8 4.6 5.4*  CL 94* 97* 99  CO2 '30 29 22  ' GLUCOSE 82 78 76  BUN 51* 72* 81*  CREATININE 6.01* 7.45* 9.33*  CALCIUM 8.8* 8.7* 8.9  PHOS  --   --  9.0*   GFR: Estimated Creatinine Clearance: 7.7 mL/min (Conal Shetley) (by C-G formula based on SCr of 9.33 mg/dL (H)). Liver Function Tests:  Recent Labs  Lab 03/05/18 1314  ALBUMIN 3.1*   No results for input(s): LIPASE, AMYLASE in the last 168 hours. No results for input(s): AMMONIA in the last 168 hours. Coagulation Profile: No results for input(s): INR, PROTIME in the last 168 hours. Cardiac Enzymes: No results for input(s): CKTOTAL, CKMB, CKMBINDEX, TROPONINI in the last 168 hours. BNP (last 3 results) No results for input(s): PROBNP in the last 8760 hours. HbA1C: No results for input(s): HGBA1C in the last 72 hours. CBG: No results for input(s): GLUCAP in the last 168 hours. Lipid Profile: No results for input(s): CHOL, HDL, LDLCALC, TRIG, CHOLHDL, LDLDIRECT in the last 72 hours. Thyroid Function Tests: No results for input(s): TSH, T4TOTAL, FREET4, T3FREE, THYROIDAB in the last 72 hours. Anemia Panel: Recent Labs    03/04/18 1436 03/05/18 0741  VITAMINB12 374  --   FOLATE  --   15.1   Sepsis Labs: No results for input(s): PROCALCITON, LATICACIDVEN in the last 168 hours.  Recent Results (from the past 240 hour(s))  MRSA PCR Screening     Status: None   Collection Time: 03/05/18 12:37 PM  Result Value Ref Range Status   MRSA by PCR NEGATIVE NEGATIVE Final    Comment:        The GeneXpert MRSA Assay (FDA approved for NASAL specimens only), is one component of Zayda Angell comprehensive MRSA colonization surveillance program. It is not intended to diagnose MRSA infection nor to guide or monitor treatment for MRSA infections. Performed at [facility], [place] [address] [place],  [address]          Radiology Studies: No results found.      Scheduled Meds: . sodium chloride   Intravenous Once  . artificial tears  1 application Left Eye QHS  . calcitRIOL  2.25 mcg Oral Once per day on Tue Thu Sat  . Chlorhexidine Gluconate Cloth  6 each Topical Q0600  . cinacalcet  90 mg Oral QPM  . darbepoetin (ARANESP) injection - DIALYSIS  200 mcg Intravenous Q Thu-HD  . Darunavir-Cobicisctat-Emtricitabine-Tenofovir Alafenamide  1 tablet Oral Q1200  . docusate sodium  100 mg Oral BID  . dolutegravir  50 mg Oral Q1200  . metoCLOPramide (REGLAN) injection  10 mg Intravenous Once  . midodrine  10 mg Oral Q1200  . multivitamin  1 tablet Oral QHS  . pantoprazole (PROTONIX) IV  40 mg Intravenous Q12H  . sucroferric oxyhydroxide  1,000 mg Oral TID WC   Continuous Infusions: . sodium chloride    . sodium chloride       LOS: 1 day    Time spent: over 30 min    Fayrene Helper, MD [facility] Pager AMION  If 7PM-7AM, please contact night-coverage [email or web address] Password [facility] 03/05/2018, 4:23 PM

## 2018-03-05 NOTE — Op Note (Signed)
[facility] Patient Name: [name] Procedure Date : 03/05/2018 MRN: [id number] Attending MD: Justice Britain , MD Date of Birth: 11-23-1955 CSN: [id number] Age: 63 Admit Type: Inpatient Procedure:                Upper GI endoscopy Indications:              Acute post hemorrhagic anemia, Occult blood in                            stool, Recent gastrointestinal bleeding Providers:                Justice Britain, MD, Cleda Daub, RN,                            William Dalton, Technician Referring MD:             Dr. Florene Glen ([facility]) Medicines:                Monitored Anesthesia Care Complications:            No immediate complications. Estimated Blood Loss:     Estimated blood loss was minimal. Procedure:                Pre-Anesthesia Assessment:                           - Prior to the procedure, a History and Physical                            was performed, and patient medications and                            allergies were reviewed. The patient's tolerance of                            previous anesthesia was also reviewed. The risks                            and benefits of the procedure and the sedation                            options and risks were discussed with the patient.                            All questions were answered, and informed consent                            was obtained. Prior Anticoagulants: The patient has                            taken aspirin. ASA Grade Assessment: III - A                            patient with severe systemic disease. After  reviewing the risks and benefits, the patient was                            deemed in satisfactory condition to undergo the                            procedure.                           After obtaining informed consent, the endoscope was                            passed under direct vision. Throughout the                            procedure, the  patient's blood pressure, pulse, and                            oxygen saturations were monitored continuously. The                            GIF-H190 ([id number]) Olympus gastroscope was                            introduced through the mouth, and advanced to the                            second part of duodenum. The upper GI endoscopy was                            accomplished without difficulty. The patient                            tolerated the procedure. Scope In: Scope Out: Findings:      No gross lesions were noted in the entire esophagus.      A 2 cm hiatal hernia was present.      Localized moderate inflammation characterized by erythema was found in       the gastric antrum and in the prepyloric region of the stomach.      Multiple dispersed, small non-bleeding erosions were found in the       cardia, in the gastric body and in the gastric antrum. There were no       stigmata of recent bleeding.      No other gross lesions were noted in the entire examined stomach.       Biopsies were taken with a cold forceps for histology and Helicobacter       pylori testing.      One non-bleeding cratered duodenal ulcer with a clean ulcer base       (Forrest Class III) was found in the duodenal bulb. The lesion was 7 mm       in largest dimension.      Patchy moderately erythematous mucosa without active bleeding and with       no stigmata of bleeding was found in the duodenal bulb and in the D1/D2       sweep.  The second portion of the duodenum was normal. Impression:               - No gross lesions in esophagus.                           - 2 cm hiatal hernia.                           - Gastritis. Non-bleeding erosive gastropathy.                            Biopsied for HP.                           - One non-bleeding duodenal ulcer with a clean                            ulcer base (Forrest Class III).                           - Erythematous duodenopathy.                            - Normal second portion of the duodenum. Recommendation:           - Proceed to scheduled colonoscopy.                           - Continue PPI BID x 2-3 months.                           - Await pathology results.                           - Would normally consider a follow up EGD in 2-3                            months to ensure healing of erosive gastropathy and                            duodenal ulcer. Will be happy to have her seen in                            [place] though she has a GI MD in [place].                           - The findings and recommendations were discussed                            with the patient.                           - The findings and recommendations were discussed                            with the referring physician.  Procedure Code(s):        --- Professional ---                           [phone], Esophagogastroduodenoscopy, flexible,                            transoral; with biopsy, single or multiple Diagnosis Code(s):        --- Professional ---                           K44.9, Diaphragmatic hernia without obstruction or                            gangrene                           K29.70, Gastritis, unspecified, without bleeding                           K31.89, Other diseases of stomach and duodenum                           K26.9, Duodenal ulcer, unspecified as acute or                            chronic, without hemorrhage or perforation                           D62, Acute posthemorrhagic anemia                           R19.5, Other fecal abnormalities                           K92.2, Gastrointestinal hemorrhage, unspecified CPT copyright 2018 American Medical Association. All rights reserved. The codes documented in this report are preliminary and upon coder review may  be revised to meet current compliance requirements. Justice Britain, MD 03/05/2018 12:15:49 PM Number of Addenda: 0

## 2018-03-05 NOTE — Transfer of Care (Signed)
Immediate Anesthesia Transfer of Care Note  Patient: [name]  Procedure(s) Performed: COLONOSCOPY WITH PROPOFOL (N/A ) ESOPHAGOGASTRODUODENOSCOPY (EGD) WITH PROPOFOL (N/A ) BIOPSY HEMOSTASIS CLIP PLACEMENT POLYPECTOMY  Patient Location: Endoscopy Unit  Anesthesia Type:MAC  Level of Consciousness: drowsy  Airway & Oxygen Therapy: Patient Spontanous Breathing and Patient connected to nasal cannula oxygen  Post-op Assessment: Report given to RN and Post -op Vital signs reviewed and stable  Post vital signs: Reviewed and stable  Last Vitals:  Vitals Value Taken Time  BP 89/49 03/05/2018 11:24 AM  Temp    Pulse 74 03/05/2018 11:26 AM  Resp 20 03/05/2018 11:26 AM  SpO2 100 % 03/05/2018 11:26 AM  Vitals shown include unvalidated device data.  Last Pain:  Vitals:   03/05/18 1124  TempSrc: Oral  PainSc: 0-No pain         Complications: No apparent anesthesia complications

## 2018-03-05 NOTE — Op Note (Signed)
[facility] Patient Name: [name] Procedure Date : 03/05/2018 MRN: [id number] Attending MD: Justice Britain , MD Date of Birth: 10-02-1955 CSN: [id number] Age: 63 Admit Type: Inpatient Procedure:                Colonoscopy Indications:              Gastrointestinal occult blood loss, Unexplained                            iron deficiency anemia, Dark stools v [place] stools Providers:                Justice Britain, MD, Cleda Daub, RN,                            William Dalton, Technician Referring MD:             Dr. Florene Glen ([facility]) Medicines:                Monitored Anesthesia Care Complications:            No immediate complications. Estimated Blood Loss:     Estimated blood loss was minimal. Procedure:                Pre-Anesthesia Assessment:                           - Prior to the procedure, a History and Physical                            was performed, and patient medications and                            allergies were reviewed. The patient's tolerance of                            previous anesthesia was also reviewed. The risks                            and benefits of the procedure and the sedation                            options and risks were discussed with the patient.                            All questions were answered, and informed consent                            was obtained. Prior Anticoagulants: The patient has                            taken aspirin. ASA Grade Assessment: III - A                            patient with severe systemic disease. After  reviewing the risks and benefits, the patient was                            deemed in satisfactory condition to undergo the                            procedure.                           - Prior to the procedure, a History and Physical                            was performed, and patient medications and                            allergies were  reviewed. The patient's tolerance of                            previous anesthesia was also reviewed. The risks                            and benefits of the procedure and the sedation                            options and risks were discussed with the patient.                            All questions were answered, and informed consent                            was obtained. Prior Anticoagulants: The patient has                            taken aspirin. ASA Grade Assessment: III - A                            patient with severe systemic disease. After                            reviewing the risks and benefits, the patient was                            deemed in satisfactory condition to undergo the                            procedure.                           After obtaining informed consent, the colonoscope                            was passed under direct vision. Throughout the  procedure, the patient's blood pressure, pulse, and                            oxygen saturations were monitored continuously. The                            CF-HQ190L ([id number]) Olympus colonoscope was                            introduced through the anus and advanced to the the                            cecum, identified by appendiceal orifice and                            ileocecal valve. The colonoscopy was performed                            without difficulty. The patient tolerated the                            procedure. The quality of the bowel preparation was                            fair. The terminal ileum, ileocecal valve,                            appendiceal orifice, and rectum were photographed. Scope In: 10:50:01 AM Scope Out: 11:15:31 AM Scope Withdrawal Time: 0 hours 17 minutes 33 seconds  Total Procedure Duration: 0 hours 25 minutes 30 seconds  Findings:      The digital rectal exam findings include non-thrombosed external       hemorrhoids.  Pertinent negatives include no palpable rectal lesions.      A large amount of semi-liquid stool was found in the entire colon,       interfering with visualization. Lavage of the area was performed using       copious amounts, resulting in incomplete clearance with fair       visualization.      A 4 mm polyp was found in the cecum. The polyp was sessile. As we were       placing the snare around the polyp, the patient had significant coughing       as we were closing the snare that led to transection of the polyp. We       then removed the residual polyp with the cold snare and there was oozing       from the transection. To prevent further bleeding after the polypectomy,       two hemostatic clips were successfully placed (MR conditional). There       was no bleeding at the end of the procedure.      Non-bleeding non-thrombosed external and internal hemorrhoids were found       during perianal exam, during digital exam and during endoscopy. The       patient has a very short rectum and with her coughing, we could not get       a good position  for retroflexion. The hemorrhoids were medium-sized and       Grade II (internal hemorrhoids that prolapse but reduce spontaneously). Impression:               - Preparation of the colon was fair.                           - Non-thrombosed external hemorrhoids found on                            digital rectal exam.                           - Stool in the entire examined colon.                           - One 4 mm polyp in the cecum, removed with a cold                            snare. Resected and retrieved. Clips (MR                            conditional) were placed.                           - Non-bleeding non-thrombosed external and internal                            hemorrhoids. Recommendation:           - The patient will be observed post-procedure,                            until all discharge criteria are met.                            - Return patient to hospital ward for ongoing care.                           - Patient has a contact number available for                            emergencies. The signs and symptoms of potential                            delayed complications were discussed with the                            patient. Return to normal activities tomorrow.                            Written discharge instructions were provided to the                            patient.                           -  Resume previous diet.                           - Continue present medications.                           - Await pathology results.                           - Repeat colonoscopy within 1 year because the                            bowel preparation was poor to ensure that no polyps                            are .                           - Return to GI clinic with primary GI in [place],                            or if decides to be seen in [place] we are happy to see.                           - BRBPR is most likely hemorrhoidal in nature and                            should be treated with a high fiber diet and stool                            softeners and hemorrhoidal ointment (Preparation H)                            and [place].                           - The findings and recommendations were discussed                            with the patient.                           - The findings and recommendations were discussed                            with the referring physician. Procedure Code(s):        --- Professional ---                           [phone], Colonoscopy, flexible; with removal of                            tumor(s), polyp(s), or other lesion(s) by snare  technique Diagnosis Code(s):        --- Professional ---                           K64.1, Second degree hemorrhoids                           K64.4, Residual hemorrhoidal skin tags                            D12.0, Benign neoplasm of cecum                           R19.5, Other fecal abnormalities                           D50.9, Iron deficiency anemia, unspecified CPT copyright 2018 [employer] Association. All rights reserved. The codes documented in this report are preliminary and upon coder review may  be revised to meet current compliance requirements. Justice Britain, MD 03/05/2018 12:33:53 PM Number of Addenda: 0

## 2018-03-05 NOTE — Anesthesia Preprocedure Evaluation (Signed)
Anesthesia Evaluation  Patient identified by MRN, date of birth, ID band Patient awake    Reviewed: Allergy & Precautions, H&P , NPO status , Patient's Chart, lab work & pertinent test results  Airway Mallampati: II   Neck ROM: full    Dental   Pulmonary former smoker,    breath sounds clear to auscultation       Cardiovascular hypertension, + Peripheral Vascular Disease   Rhythm:regular Rate:Normal     Neuro/Psych PSYCHIATRIC DISORDERS Dementia  Neuromuscular disease    GI/Hepatic hematochezia   Endo/Other    Renal/GU ESRF and DialysisRenal disease     Musculoskeletal   Abdominal   Peds  Hematology  (+) Blood dyscrasia, anemia ,   Anesthesia Other Findings   Reproductive/Obstetrics                             Anesthesia Physical Anesthesia Plan  ASA: IV  Anesthesia Plan: MAC   Post-op Pain Management:    Induction: Intravenous  PONV Risk Score and Plan: 2 and Propofol infusion and Treatment may vary due to age or medical condition  Airway Management Planned: Nasal Cannula  Additional Equipment:   Intra-op Plan:   Post-operative Plan:   Informed Consent: I have reviewed the patients History and Physical, chart, labs and discussed the procedure including the risks, benefits and alternatives for the proposed anesthesia with the patient or authorized representative who has indicated his/her understanding and acceptance.       Plan Discussed with: CRNA, Anesthesiologist and Surgeon  Anesthesia Plan Comments:         Anesthesia Quick Evaluation

## 2018-03-05 NOTE — Progress Notes (Addendum)
[facility] Progress Note   Subjective:   Patient seen in room. Dialysis postponed yesterday due to large amount of melena. Plan for HD today after endoscopy/colonoscopy. Reports ongoing diarrhea and occasional nonproductive cough. Denies CP, palpitations, SOB/dyspnea, N/V. S/p 3 units PRBC yesterday.  Objective Vitals:   03/04/18 1905 03/05/18 0459 03/05/18 0913 03/05/18 0943  BP:  (!) 104/56 110/69 (!) 149/79  Pulse: (!) 50 65 70   Resp: 20 (!) 21 (!) 22 17  Temp: (!) 97.4 F (36.3 C) (!) 97.5 F (36.4 C) 97.6 F (36.4 C) 98 F (36.7 C)  TempSrc: Oral Oral Oral Oral  SpO2: 100% 99% 98% 100%  Weight:    105.7 kg  Height:    5\' 5"  (1.651 m)   Physical Exam General: Well developed, well nourished female in NAD Heart: RRR, 4/6 holosystolic murmur, normal U9/W1 Lungs: Respirations even and unlabored. CTA bilaterally without wheezing, rhonchi or rales Abdomen: Soft, non-tender, non-distended. +BS. No palpable masses Liver down 6 cm Extremities: 2+ edema b/l lower extremities Dialysis Access: L thigh AVG, + bruit   Additional Objective Labs: Basic Metabolic Panel: Recent Labs  Lab 03/03/18 0859 03/04/18 0612  NA 136 137  K 4.8 4.6  CL 94* 97*  CO2 30 29  GLUCOSE 82 78  BUN 51* 72*  CREATININE 6.01* 7.45*  CALCIUM 8.8* 8.7*   CBC: Recent Labs  Lab 03/03/18 0859 03/04/18 0612 03/04/18 1436 03/05/18 0428  WBC 4.8 4.2 5.3 4.6  NEUTROABS 3.4  --   --   --   HGB 8.3* 6.9* 8.1* 9.0*  HCT 27.1* 21.7* 25.4* 28.5*  MCV 116.3* 114.8* 109.5* 108.8*  PLT 165 142* 165 154   Blood Culture    Component Value Date/Time   SDES BLOOD LEFT HAND 07/21/2015 1604   SPECREQUEST BOTTLES DRAWN AEROBIC ONLY 5CC 07/21/2015 1604   CULT NO GROWTH 5 DAYS 07/21/2015 1604   REPTSTATUS 07/26/2015 FINAL 07/21/2015 1604   Medications: . sodium chloride     . [MAR Hold] sodium chloride   Intravenous Once  . [MAR Hold] artificial tears  1 application Left Eye QHS  . [MAR  Hold] calcitRIOL  2.25 mcg Oral Once per day on Tue Thu Sat  . [MAR Hold] Chlorhexidine Gluconate Cloth  6 each Topical Q0600  . [MAR Hold] cinacalcet  90 mg Oral QPM  . [MAR Hold] darbepoetin (ARANESP) injection - DIALYSIS  200 mcg Intravenous Q Thu-HD  . [MAR Hold] Darunavir-Cobicisctat-Emtricitabine-Tenofovir Alafenamide  1 tablet Oral Q1200  . [MAR Hold] docusate sodium  100 mg Oral BID  . [MAR Hold] dolutegravir  50 mg Oral Q1200  . [MAR Hold] metoCLOPramide (REGLAN) injection  10 mg Intravenous Once  . [MAR Hold] midodrine  10 mg Oral Q1200  . [MAR Hold] multivitamin  1 tablet Oral QHS  . [MAR Hold] pantoprazole (PROTONIX) IV  40 mg Intravenous Q12H  . [MAR Hold] sucroferric oxyhydroxide  1,000 mg Oral TID WC    Dialysis Orders: TTS at [facility] 4hr, 400/A1.5, EDW 104.5kg, 2K/2Ca, AVG, UF #2, heparin 5900u bolus - Aranesp 58mcg IV weekly (last 2/20) - Venofer 50mg  IV weekly - Calcitriol 2.79mcg PO q HD  - Home BMM meds: Sensipar 90, Velphoro 2/meals  Assessment/Plan: 1.  Rectal bleeding: Episode of melena prior to HD yesterday. No current abdominal pain. Hgb improved to 9.0 s/p 3 units PRBC. For endoscopy and colonoscopy today, GI following.  2.  ESRD: Unable to complete HD yesterday due to GI bleed. Denies  SOB today. K 4.6. Plan for HD today after GI procedures. Volume xs 3.  Hypertension/volume: Hypotensive on admission, BP improved today. + edema. Received multiple blood products/IV fluids since admission. UF as tolerated during HD today.  4.  Anemia (ABLA): See #1. S/p PRBC yesterday. To receive increased dose of aranesp today.  5.  Metabolic bone disease: Ca ok, Phos/ALb pending. Continue home meds (Velphoro/Sensipar/Calc) - keep in mind that velphoro is iron-based and may cause dark stools.  6.    HIV: Continue home meds per primary.  7  Dementia  [name] Paganini, PA-C 03/05/2018, 9:53 AM  [facility] Pager: [phone] I have seen and examined this patient  and agree with the plan of care seen, eval, examined, counseled. [name] 03/05/2018, 10:47 AM

## 2018-03-06 LAB — HEPATITIS B SURFACE ANTIGEN: Hepatitis B Surface Ag: NEGATIVE

## 2018-03-06 LAB — CBC
HCT: 28.3 % — ABNORMAL LOW (ref 36.0–46.0)
Hemoglobin: 8.7 g/dL — ABNORMAL LOW (ref 12.0–15.0)
MCH: 33.5 pg (ref 26.0–34.0)
MCHC: 30.7 g/dL (ref 30.0–36.0)
MCV: 108.8 fL — ABNORMAL HIGH (ref 80.0–100.0)
Platelets: 143 10*3/uL — ABNORMAL LOW (ref 150–400)
RBC: 2.6 MIL/uL — ABNORMAL LOW (ref 3.87–5.11)
RDW: 17.5 % — ABNORMAL HIGH (ref 11.5–15.5)
WBC: 4.6 10*3/uL (ref 4.0–10.5)
nRBC: 0 % (ref 0.0–0.2)

## 2018-03-06 LAB — MAGNESIUM: Magnesium: 1.9 mg/dL (ref 1.7–2.4)

## 2018-03-06 LAB — IRON AND TIBC
Iron: 101 ug/dL (ref 28–170)
Saturation Ratios: 47 % — ABNORMAL HIGH (ref 10.4–31.8)
TIBC: 214 ug/dL — ABNORMAL LOW (ref 250–450)
UIBC: 113 ug/dL

## 2018-03-06 MED ORDER — PANTOPRAZOLE SODIUM 40 MG PO TBEC
40.0000 mg | DELAYED_RELEASE_TABLET | Freq: Two times a day (BID) | ORAL | Status: DC
  Administered 2018-03-06 – 2018-03-07 (×3): 40 mg via ORAL
  Filled 2018-03-06 (×4): qty 1

## 2018-03-06 MED ORDER — SUCROFERRIC OXYHYDROXIDE 500 MG PO CHEW
1500.0000 mg | CHEWABLE_TABLET | Freq: Three times a day (TID) | ORAL | Status: DC
  Filled 2018-03-06 (×8): qty 3

## 2018-03-06 NOTE — Progress Notes (Addendum)
[facility] Progress Note   Subjective:   Patient seen in room. Reports feeling better after dialysis yesterday (off schedule due to episode of melena 2/27). Denies SOB/dyspnea, CP, palpitations, abdominal pain, N/V/D. No further bleeding reported.   Objective Vitals:   03/05/18 1658 03/05/18 1735 03/05/18 2039 03/06/18 0533  BP: (!) 96/56 95/60 (!) 83/48 107/68  Pulse: 63 65 62 69  Resp: 18 20 18    Temp: 97.9 F (36.6 C) 98.2 F (36.8 C) 98.4 F (36.9 C) 98.7 F (37.1 C)  TempSrc: Oral Oral Oral Oral  SpO2: 100% 100% 100% 100%  Weight: 104.9 kg     Height:       Physical Exam General: Well developed, obese female, NAD Confused Heart: RRR, grade 4/6 holosystolic murmur, normal Q6/S3 Lungs: Respirations even and unlabored. + Expiratory wheeze LLL, no rhonchi or rales auscultated Abdomen: Soft, non-tender, non-distended, + BS. No palpable masses Extremities: 1+ edema b/l lower extremities Dialysis Access:  L thigh AVG, + bruit  Additional Objective Labs: Basic Metabolic Panel: Recent Labs  Lab 03/04/18 0612 03/05/18 1314 03/06/18 0433  NA 137 134* 134*  K 4.6 5.4* 4.3  CL 97* 99 98  CO2 29 22 28   GLUCOSE 78 76 103*  BUN 72* 81* 29*  CREATININE 7.45* 9.33* 5.18*  CALCIUM 8.7* 8.9 8.3*  PHOS  --  9.0*  --    Liver Function Tests: Recent Labs  Lab 03/05/18 1314  ALBUMIN 3.1*   CBC: Recent Labs  Lab 03/03/18 0859 03/04/18 0612 03/04/18 1436 03/05/18 0428 03/06/18 0433  WBC 4.8 4.2 5.3 4.6 4.6  NEUTROABS 3.4  --   --   --   --   HGB 8.3* 6.9* 8.1* 9.0* 8.7*  HCT 27.1* 21.7* 25.4*  22.7* 28.5* 28.3*  MCV 116.3* 114.8* 109.5* 108.8* 108.8*  PLT 165 142* 165 154 143*   Blood Culture    Component Value Date/Time   SDES BLOOD LEFT HAND 07/21/2015 1604   SPECREQUEST BOTTLES DRAWN AEROBIC ONLY 5CC 07/21/2015 1604   CULT NO GROWTH 5 DAYS 07/21/2015 1604   REPTSTATUS 07/26/2015 FINAL 07/21/2015 1604   Medications:  . sodium chloride    Intravenous Once  . artificial tears  1 application Left Eye QHS  . calcitRIOL  2.25 mcg Oral Once per day on Tue Thu Sat  . Chlorhexidine Gluconate Cloth  6 each Topical Q0600  . cinacalcet  90 mg Oral QPM  . darbepoetin (ARANESP) injection - DIALYSIS  200 mcg Intravenous Q Thu-HD  . Darunavir-Cobicisctat-Emtricitabine-Tenofovir Alafenamide  1 tablet Oral Q1200  . docusate sodium  100 mg Oral BID  . dolutegravir  50 mg Oral Q1200  . metoCLOPramide (REGLAN) injection  10 mg Intravenous Once  . midodrine  10 mg Oral Q1200  . multivitamin  1 tablet Oral QHS  . pantoprazole (PROTONIX) IV  40 mg Intravenous Q12H  . sucroferric oxyhydroxide  1,000 mg Oral TID WC    Dialysis Orders: TTS at [facility] 4hr, 400/A1.5, EDW 104.5kg, 2K/2Ca, AVG, UF #2, heparin 5900u bolus - Aranesp 61mcg IV weekly (last 2/20) - Venofer 50mg  IV weekly - Calcitriol 2.75mcg PO q HD  - Home BMM meds: Sensipar 90, Velphoro 2/meals  Assessment/Plan: 1. Rectal bleeding: Episode of melena prior to HD on 03/04/2018. No current abdominal pain denies any further bleeding. S/p 3 units PRBC on 2/27. Endoscopy completed 2/28 revealed gastritis, non-bleeding duodenal ulcer. Colonoscopy revealed external hemorrhoids and polyp. Further management per GI.  2. ESRD:Completed dialysis off  schedule yesterday. Volume improved, still some excess. K 4.3. Will plan for HD on 03/08/2018.  3. Hypertension/volume:BP soft yesterday, on midodrine. Still some excess volume on exam. Will plan for dialysis on 03/08/2018 with UF as tolerated.  4. Anemia(ABLA): See #1. S/p PRBC 2/27. Aranesp 200 mcg given yesterday. Hgb 8.7. Continue weekly Aranesp, follow Hgb trend. Will check iron panel.  5. Metabolic bone disease:Ca 8.3, corrected 9.0. Phos 9.0, will increase velphoro -note that velphoro is iron-based and may cause dark stools. Continue calcitriol and sensipar.  6.    HIV: Continue home meds per primary.  7 Dementia  ? Goals of care  Anice Paganini, PA-C 03/06/2018, 8:09 AM  [facility] Pager: [phone] I have seen and examined this patient and agree with the plan of care seen, eval, examined. Jeneen Rinks Mega Kinkade 03/06/2018, 11:08 AM

## 2018-03-06 NOTE — Progress Notes (Signed)
PROGRESS NOTE    [name]  VXY:[id number] DOB: 19-Dec-1955 DOA: 03/03/2018 PCP: Patient, No Pcp Per   Brief Narrative:  [name] is an 63 y.o. female with pmh of HIV, systolic CHF, MGUS, HTN, ESRD on HD (TTS), MSSA/enterococcus bacteremia; who presented with bright red blood per rectum from [facility] facility.   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  Pt received 3 units pRBC on 2/27. Now s/p EGD/Colonoscopy with GI -  EGD notable for gastritis, nonbleeding erosive gastropathy, 1 nonbleeding duodenal ulcer with clean base, erythematous duodenopathy.  Recommending BID PPI x 2-3 months.  Follow up biopsies taken for H pylori.  Needs f/u EGD in 2-3 months to ensure healing of erosive gastropathy and duodenal ulcer.  Colonoscopy notable for nonthrombosed external hemorrhoids, 4 mm polyp removed with cold snare, non bleeding, non thrombosed, external and internal hemorrhoids. Follow up pathology Follow up repeat colonoscopy in 1 year due to poor prep Follow up with primary GI in [place] or here in [place] per pt preference Continue PPI BID Continue following H/H - Hb relatively stable today She wants to follow up with [facility] in [place], discussed with GI who noted they would arrange follow up.  [place] for d/c from their perspective.  Macrocytic Anemia: b12 374, folate wnl as well.  Labs suggestive of AOCD.  ESRD on HD: Patient normally dialyzes at [facility] on T/T/Sat, and her nephrologist is Dr. Grandville Silos.  She has Kenderick Kobler left thigh fistula.  Dialyzed today, 2/28. - HD per Nephrology consulted  HIV with dementia: Patient with previous history of medication  noncompliance, but currently living in [facility] skilled nursing facility for which she should be compliant.  Last CD4 T-cell count 210 with viral load undetectable on 12/09/2017.  Followed by Dr. Karolee Ohs of infectious disease. - continue Symtuza and Tivicay  Chronic respiratory failure on home O2: Patient reports being on baseline oxygen requirements of 2 L nasal cannula oxygen. -Continue nasal cannula oxygen  Intermittent hypotension: Blood pressure currently stable. -Continue Midodrine  Obesity: At baseline patient is wheelchair-bound.  BMI 39.29 kg/m  Body mass index is 39.29 kg/m.   DVT prophylaxis: SCDs Code Status: full  Family Communication: none at bedside Disposition Plan: Pt medically ready for discharge, but needs plan for next dialysis day as currently off schedule.  Discussed with renal and initially offered early slot on Monday, but SNF unable to transport this early.  On rediscussion with nephrology, sounds like 1 pm at Monday may be an option.  Will discuss with SW tomorrow and plan for d/c tomorrow if this is possible.   Consultants:   GI  Nephrology  Procedures:  Colonoscopy Impression - Preparation of the colon was fair. - Non-thrombosed external hemorrhoids found on digital rectal exam. - Stool in the entire examined colon. - One 4 mm polyp in the cecum, removed with Jameis Newsham cold snare. Resected and retrieved. Clips (MR conditional) were placed. - Non-bleeding non-thrombosed external and internal hemorrhoids. Recommendations - The patient will be observed post-procedure, until all discharge criteria are met. - Return patient to hospital ward for ongoing care. - Patient has Erasmus Bistline  contact number available for emergencies. The signs and symptoms of potential delayed complications were discussed with the patient. Return to normal activities tomorrow. Written discharge instructions were provided to the patient. - Resume previous diet. - Continue present  medications. - Await pathology results. - Repeat colonoscopy within 1 year because the bowel preparation was poor to ensure that no polyps are . - Return to GI clinic with primary GI in [place], or if decides to be seen in [place] we are happy to see. - BRBPR is most likely hemorrhoidal in nature and should be treated with Hasten Sweitzer high fiber diet and stool softeners and hemorrhoidal ointment (Preparation H) and Sitz Bathes. - The findings and recommendations were discussed with the patient. - The findings and recommendations were discussed with the referring physician.  Upper Endoscopy Impression - No gross lesions in esophagus. - 2 cm hiatal hernia. - Gastritis. Non-bleeding erosive gastropathy. Biopsied for HP. - One non-bleeding duodenal ulcer with Vince Ainsley clean ulcer base (Forrest Class III). - Erythematous duodenopathy. - Normal second portion of the duodenum. Recommendations - Proceed to scheduled colonoscopy. - Continue PPI BID x 2-3 months. - Await pathology results. - Would normally consider Ollis Daudelin follow up EGD in 2-3 months to ensure healing of erosive gastropathy and duodenal ulcer. Will be happy to have her seen in [place] though she has Jacqulyne Gladue GI MD in [place]. - The findings and recommendations were discussed with the patient. - The findings and recommendations were discussed with the referring physician.  Antimicrobials:  Anti-infectives (From admission, onward)   Start     Dose/Rate Route Frequency Ordered Stop   03/03/18 1315  Darunavir-Cobicisctat-Emtricitabine-Tenofovir Alafenamide (SYMTUZA) 800-150-200-10 MG TABS 1 tablet    Note to Pharmacy:  Patient taking differently: 12 noon     1 tablet Oral Daily 03/03/18 1308     03/03/18 1315  dolutegravir (TIVICAY) tablet 50 mg    Note to Pharmacy:  Patient taking differently: 12 noon     50 mg Oral Daily 03/03/18 1308           Subjective: Doing well.  No complaints.  Objective: Vitals:   03/05/18 2039 03/06/18 0533 03/06/18 0921  03/06/18 1729  BP: (!) 83/48 107/68 (!) 100/59 112/62  Pulse: 62 69 66 61  Resp: _0 Temp: 98.4 F (36.9 C) 98.7 F (37.1 C) 99 F (37.2 C) 98.9 F (37.2 C)  TempSrc: Oral Oral Oral Oral  SpO2: 100% 100% 100% 100%  Weight:      Height:        Intake/Output Summary (Last 24 hours) at 03/06/2018 1747 Last data filed at 03/06/2018 1728 Gross per 24 hour  Intake 300 ml  Output -  Net 300 ml   Filed Weights   03/05/18 0943 03/05/18 1255 03/05/18 1658  Weight: 105.7 kg 106.8 kg 104.9 kg    Examination:  General: No acute distress. Cardiovascular: Heart sounds show Deshannon Hinchliffe regular rate, and rhythm.  Lungs: Clear to auscultation bilaterally Abdomen: Soft, nontender, nondistended Neurological: Alert and oriented 3. Moves all extremities 4. Cranial nerves II through XII grossly intact. Skin: Warm and dry. No rashes or lesions.   Data Reviewed: I have personally reviewed following labs and imaging studies  CBC: Recent Labs  Lab 03/03/18 0859 03/04/18 0612 03/04/18 1436 03/05/18 0428 03/06/18 0433  WBC 4.8 4.2 5.3 4.6 4.6  NEUTROABS 3.4  --   --   --   --   HGB 8.3* 6.9* 8.1* 9.0* 8.7*  HCT  27.1* 21.7* 25.4*  22.7* 28.5* 28.3*  MCV 116.3* 114.8* 109.5* 108.8* 108.8*  PLT 165 142* 165 154 917*   Basic Metabolic Panel: Recent Labs  Lab 03/03/18 0859 03/04/18 0612 03/05/18 1314 03/06/18 0433  NA 136 137 134* 134*  K 4.8 4.6 5.4* 4.3  CL 94* 97* 99 98  CO2 _0 GLUCOSE 82 78 76 103*  BUN 51* 72* 81* 29*  CREATININE 6.01* 7.45* 9.33* 5.18*  CALCIUM 8.8* 8.7* 8.9 8.3*  MG  --   --   --  1.9  PHOS  --   --  9.0*  --    GFR: Estimated Creatinine Clearance: 13.7 mL/min (Maziah Keeling) (by C-G formula based on SCr of 5.18 mg/dL (H)). Liver Function Tests: Recent Labs  Lab 03/05/18 1314  ALBUMIN 3.1*   No results for input(s): LIPASE, AMYLASE in the last 168 hours. No results for input(s): AMMONIA in the last 168 hours. Coagulation Profile: No results for  input(s): INR, PROTIME in the last 168 hours. Cardiac Enzymes: No results for input(s): CKTOTAL, CKMB, CKMBINDEX, TROPONINI in the last 168 hours. BNP (last 3 results) No results for input(s): PROBNP in the last 8760 hours. HbA1C: No results for input(s): HGBA1C in the last 72 hours. CBG: No results for input(s): GLUCAP in the last 168 hours. Lipid Profile: No results for input(s): CHOL, HDL, LDLCALC, TRIG, CHOLHDL, LDLDIRECT in the last 72 hours. Thyroid Function Tests: No results for input(s): TSH, T4TOTAL, FREET4, T3FREE, THYROIDAB in the last 72 hours. Anemia Panel: Recent Labs    03/04/18 1436 03/05/18 0741 03/05/18 0923  VITAMINB12 374  --   --   FOLATE  --  15.1  --   TIBC  --   --  214*  IRON  --   --  101   Sepsis Labs: No results for input(s): PROCALCITON, LATICACIDVEN in the last 168 hours.  Recent Results (from the past 240 hour(s))  MRSA PCR Screening     Status: None   Collection Time: 03/05/18 12:37 PM  Result Value Ref Range Status   MRSA by PCR NEGATIVE NEGATIVE Final    Comment:        The GeneXpert MRSA Assay (FDA approved for NASAL specimens only), is one component of Deborh Pense comprehensive MRSA colonization surveillance program. It is not intended to diagnose MRSA infection nor to guide or monitor treatment for MRSA infections. Performed at [facility], [place] [address] [place], [place] [address]          Radiology Studies: No results found.      Scheduled Meds: . sodium chloride   Intravenous Once  . artificial tears  1 application Left Eye QHS  . calcitRIOL  2.25 mcg Oral Once per day on Tue Thu Sat  . Chlorhexidine Gluconate Cloth  6 each Topical Q0600  . cinacalcet  90 mg Oral QPM  . darbepoetin (ARANESP) injection - DIALYSIS  200 mcg Intravenous Q Thu-HD  . Darunavir-Cobicisctat-Emtricitabine-Tenofovir Alafenamide  1 tablet Oral Q1200  . docusate sodium  100 mg Oral BID  . dolutegravir  50 mg Oral Q1200  . metoCLOPramide  (REGLAN) injection  10 mg Intravenous Once  . midodrine  10 mg Oral Q1200  . multivitamin  1 tablet Oral QHS  . pantoprazole (PROTONIX) IV  40 mg Intravenous Q12H  . sucroferric oxyhydroxide  1,500 mg Oral TID WC   Continuous Infusions:    LOS: 2 days    Time spent: over 30  min    Fayrene Helper, MD [facility] Pager AMION  If 7PM-7AM, please contact night-coverage [email or web address] Password [facility] 03/06/2018, 5:47 PM

## 2018-03-06 NOTE — Progress Notes (Signed)
CSW was informed by Dr. Florene Glen that the patient is medically ready for discharge. CSW has reached out to Admissions Director at [facility] to see if they are able to take the patient back.   CSW is awaiting a response. CSW will continue to follow.   Domenic Schwab, MSW, [place]

## 2018-03-07 ENCOUNTER — Encounter (HOSPITAL_COMMUNITY): Payer: Self-pay | Admitting: Gastroenterology

## 2018-03-07 LAB — BASIC METABOLIC PANEL
Anion gap: 8 (ref 5–15)
BUN: 29 mg/dL — ABNORMAL HIGH (ref 8–23)
CO2: 28 mmol/L (ref 22–32)
Calcium: 8.3 mg/dL — ABNORMAL LOW (ref 8.9–10.3)
Chloride: 98 mmol/L (ref 98–111)
Creatinine, Ser: 5.18 mg/dL — ABNORMAL HIGH (ref 0.44–1.00)
GFR calc Af Amer: 10 mL/min — ABNORMAL LOW (ref >60)
GFR calc non Af Amer: 8 mL/min — ABNORMAL LOW (ref >60)
Glucose, Bld: 103 mg/dL — ABNORMAL HIGH (ref 70–99)
Potassium: 4.3 mmol/L (ref 3.5–5.1)
Sodium: 134 mmol/L — ABNORMAL LOW (ref 135–145)

## 2018-03-07 LAB — MAGNESIUM: Magnesium: 2 mg/dL (ref 1.7–2.4)

## 2018-03-07 LAB — BPAM RBC
Blood Product Expiration Date: 202003232359
Blood Product Expiration Date: 202003232359
Blood Product Expiration Date: 202003232359
ISSUE DATE / TIME: 202002270838
ISSUE DATE / TIME: 202002271433
Unit Type and Rh: 5100
Unit Type and Rh: 5100
Unit Type and Rh: 5100

## 2018-03-07 LAB — TYPE AND SCREEN
ABO/RH(D): O POS
Antibody Screen: NEGATIVE
Unit division: 0
Unit division: 0
Unit division: 0

## 2018-03-07 LAB — COMPREHENSIVE METABOLIC PANEL
ALT: 7 U/L (ref 0–44)
AST: 14 U/L — ABNORMAL LOW (ref 15–41)
Albumin: 2.9 g/dL — ABNORMAL LOW (ref 3.5–5.0)
Alkaline Phosphatase: 106 U/L (ref 38–126)
Anion gap: 10 (ref 5–15)
BUN: 42 mg/dL — ABNORMAL HIGH (ref 8–23)
CO2: 27 mmol/L (ref 22–32)
Calcium: 7.8 mg/dL — ABNORMAL LOW (ref 8.9–10.3)
Chloride: 100 mmol/L (ref 98–111)
Creatinine, Ser: 7.61 mg/dL — ABNORMAL HIGH (ref 0.44–1.00)
GFR calc Af Amer: 6 mL/min — ABNORMAL LOW (ref >60)
GFR calc non Af Amer: 5 mL/min — ABNORMAL LOW (ref >60)
Glucose, Bld: 87 mg/dL (ref 70–99)
Potassium: 4.4 mmol/L (ref 3.5–5.1)
Sodium: 137 mmol/L (ref 135–145)
Total Bilirubin: 0.7 mg/dL (ref 0.3–1.2)
Total Protein: 7.6 g/dL (ref 6.5–8.1)

## 2018-03-07 LAB — CBC
HCT: 28.5 % — ABNORMAL LOW (ref 36.0–46.0)
Hemoglobin: 8.7 g/dL — ABNORMAL LOW (ref 12.0–15.0)
MCH: 33.7 pg (ref 26.0–34.0)
MCHC: 30.5 g/dL (ref 30.0–36.0)
MCV: 110.5 fL — ABNORMAL HIGH (ref 80.0–100.0)
Platelets: 141 10*3/uL — ABNORMAL LOW (ref 150–400)
RBC: 2.58 MIL/uL — ABNORMAL LOW (ref 3.87–5.11)
RDW: 16.5 % — ABNORMAL HIGH (ref 11.5–15.5)
WBC: 4.4 10*3/uL (ref 4.0–10.5)
nRBC: 0 % (ref 0.0–0.2)

## 2018-03-07 MED ORDER — CHLORHEXIDINE GLUCONATE CLOTH 2 % EX PADS
6.0000 | MEDICATED_PAD | Freq: Every day | CUTANEOUS | Status: DC
  Administered 2018-03-08: 6 via TOPICAL

## 2018-03-07 NOTE — Progress Notes (Signed)
**Note De-Identified vi Obfusction** PROGRESS NOTE    [name]  UUE:[id number] DOB: 06/15/1955 DOA: 03/03/2018 PCP: [name], No Pcp Per   Brief Nrrtive:  [name] is n 63 y.o. femle with pmh of HIV, systolic CHF, MGUS, HTN, ESRD on HD (TTS), MSSA/enterococcus bcteremi; who presented with bright red blood per rectum from [facility].   Assessment & Pln:   Principl Problem:   Hemtochezi Active Problems:   Humn immunodeficiency virus (HIV) disese (HCC)   Hypotension, intermittent esp fter HD   Obesity (BMI 30-39.9)   HIV dementi ([place])   ESRD needing dilysis ([place])   Acute blood loss nemi   GI bleed with cute blood loss nemi:  Hb dropped from 8.3 to 6.9 on 2/27.  Pt received 3 units pRBC on 2/27. Now s/p EGD/Colonoscopy with GI -  EGD notble for gstritis, nonbleeding erosive gstropthy, 1 nonbleeding duodenl ulcer with clen bse, erythemtous duodenopthy.  Recommending BID PPI x 2-3 months.  Follow up biopsies tken for H pylori.  Needs f/u EGD in 2-3 months to ensure heling of erosive gstropthy nd duodenl ulcer.  Colonoscopy notble for nonthrombosed externl hemorrhoids, 4 mm polyp removed with cold snre, non bleeding, non thrombosed, externl nd internl hemorrhoids. Follow up pthology Follow up repet colonoscopy in 1 yer due to poor prep Follow up with prim[facility] GI in [place] or here in [place] per pt preference Continue PPI BID Continue following H/H - Hb reltively stble tody She wnts to follow up with [facility] in [place], discussed with GI who noted they would rrnge follow up.  [place] for d/c from their perspective.  [place] Anemi: b12 374, folte wnl s well.  Lbs suggestive of AOCD.  ESRD on HD: [name] normlly dilyzes t [facility] on T/T/St, nd her nephrologist is Dr. [place] Silos.  She hs  left thigh fistul.  Dilyzed tody, 2/28. - HD per Nephrology consulted  HIV with dementi: [name] with previous history of mediction  noncomplince, but currently living in [facility] fcility for which she should be complint.  Lst CD4 T-cell count 210 with virl lod undetectble on 12/09/2017.  Followed by Dr. Krolee Ohs of infectious disese. - continue Symtuz nd Tivicy  Chronic respirtory filure on home O2: [name] reports being on bseline oxygen requirements of 2 L nsl cnnul oxygen. -Continue nsl cnnul oxygen  Intermittent hypotension: Blood pressure currently stble. -Continue Midodrine  Obesity: At bseline ptient is wheelchir-bound.  BMI 39.29 kg/m  Body mss index is 39.29 kg/m.   DVT prophylxis: SCDs Code Sttus: full  Fmily Communiction: none t bedside Disposition Pln: Pt mediclly redy for dischrge, but needs pln for next dilysis dy s currently off schedule.  Discussed with renl nd initilly offered erly slot on Mondy, but SNF unble to trnsport this erly.  On rediscussion with nephrology, sounds like 1 pm t Mondy my be n option.  Unfortuntely, SNF unble to rrnge trnsporttion tody, but will be ble to rrnge trnsporttion fter dilysis tomorrow.   Consultnts:   GI  Nephrology  Procedures:  Colonoscopy Impression - Preprtion of the colon ws fir. - Non-thrombosed externl hemorrhoids found on digitl rectl exm. - Stool in the entire exmined colon. - One 4 mm polyp in the cecum, removed with  cold snre. Resected nd retrieved. Clips (MR conditionl) were plced. - Non-bleeding non-thrombosed externl nd internl hemorrhoids. Recommendtions - The ptient will be observed post-procedure, until ll dischrge criteri re met. - Return ptient to hospitl wrd for ongoing cre. - **Note De-Identified vi Obfusction** [name] hs  contct number vilble for emergencies. The signs nd symptoms of potentil delyed complictions were discussed with the ptient. Return to norml ctivities tomorrow. Written dischrge instructions were provided to the  ptient. - Resume previous diet. - Continue present medictions. - Awit pthology results. - Repet colonoscopy within 1 yer becuse the bowel preprtion ws poor to ensure tht no polyps re . - Return to GI clinic with [facility] GI in [place], or if decides to be seen in [place] we re hppy to see. - BRBPR is most likely hemorrhoidl in nture nd should be treted with  high fiber diet nd stool softeners nd hemorrhoidl ointment (Preprtion H) nd Sitz Bthes. - The findings nd recommendtions were discussed with the ptient. - The findings nd recommendtions were discussed with the referring physicin.  Upper Endoscopy Impression - No gross lesions in esophgus. - 2 cm hitl herni. - Gstritis. Non-bleeding erosive gstropthy. Biopsied for HP. - One non-bleeding duodenl ulcer with  clen ulcer bse (Forrest Clss III). - Erythemtous duodenopthy. - Norml second portion of the duodenum. Recommendtions - Proceed to scheduled colonoscopy. - Continue PPI BID x 2-3 months. - Awit pthology results. - Would normlly consider  follow up EGD in 2-3 months to ensure heling of erosive gstropthy nd duodenl ulcer. Will be hppy to hve her seen in [place] though she hs  GI MD in [place]. - The findings nd recommendtions were discussed with the ptient. - The findings nd recommendtions were discussed with the referring physicin.  Antimicrobils:  Anti-infectives (From dmission, onwrd)   Strt     Dose/Rte Route Frequency Ordered Stop   03/03/18 1315  Drunvir-Cobicisctt-Emtricitbine-Tenofovir Alfenmide (SYMTUZA) 800-150-200-10 MG TABS 1 tblet    Note to Phrmcy:  [name] tking differently: 12 noon     1 tblet Orl Dily 03/03/18 1308     03/03/18 1315  dolutegrvir (TIVICAY) tblet 50 mg    Note to Phrmcy:  [name] tking differently: 12 noon     50 mg Orl Dily 03/03/18 1308           Subjective: No complints.  Wnts to get bck to fcility  s soon s possible.  Objective: Vitls:   03/06/18 1729 03/06/18 2024 03/07/18 0606 03/07/18 1044  BP: 112/62 107/61 110/61 113/61  Pulse: 61 69 64 68  Resp: _0 Temp: 98.9 F (37.2 C) 98.8 F (37.1 C) 98.8 F (37.1 C) 98.7 F (37.1 C)  TempSrc: Orl Orl  Orl  SpO2: 100% 100% 99% 100%  Weight:  104.9 kg    Height:        Intke/Output Summry (Lst 24 hours) t 03/07/2018 1551 Lst dt filed t 03/07/2018 1042 Gross per 24 hour  Intke 420 ml  Output 0 ml  Net 420 ml   Filed Weights   03/05/18 1255 03/05/18 1658 03/06/18 2024  Weight: 106.8 kg 104.9 kg 104.9 kg    Exmintion:  Generl: No cute distress. Crdiovsculr: Hert sounds show  regulr rte, nd rhythm. Lungs: Cler to usculttion bilterlly  Abdomen: Soft, nontender, nondistended Neurologicl: Alert nd oriented 3. Moves ll extremities 4. Crnil nerves II through XII grossly intct. Skin: Wrm nd dry. No rshes or lesions.    Dt Reviewed: I hve personlly reviewed following lbs nd imging studies  CBC: Recent Lbs  Lb 03/03/18 0859 03/04/18 0612 03/04/18 1436 03/05/18 0428 03/06/18 0433 03/07/18 0619  WBC 4.8 4.2 5.3 4.6 4.6 4.4  NEUTROABS 3.4  --   --   --   --   -- **Note De-Identified vi Obfusction** HGB 8.3* 6.9* 8.1* 9.0* 8.7* 8.7*  HCT 27.1* 21.7* 25.4*  22.7* 28.5* 28.3* 28.5*  MCV 116.3* 114.8* 109.5* 108.8* 108.8* 110.5*  PLT 165 142* 165 154 143* 863*   Bsic Metbolic Pnel: Recent Lbs  Lb 03/03/18 0859 03/04/18 0612 03/05/18 1314 03/06/18 0433 03/07/18 0619  N 136 137 134* 134* 137  K 4.8 4.6 5.4* 4.3 4.4  CL 94* 97* 99 98 100  CO2 _0 GLUCOSE 82 78 76 103* 87  BUN 51* 72* 81* 29* 42*  CRETININE 6.01* 7.45* 9.33* 5.18* 7.61*  CLCIUM 8.8* 8.7* 8.9 8.3* 7.8*  MG  --   --   --  1.9 2.0  PHOS  --   --  9.0*  --   --    GFR: Estimted Cretinine Clernce: 9.3 mL/min () (by C-G formul bsed on SCr of 7.61 mg/dL (H)). Liver Function Tests: Recent Lbs  Lb  03/05/18 1314 03/07/18 0619  ST  --  14*  LT  --  7  LKPHOS  --  106  BILITOT  --  0.7  PROT  --  7.6  LBUMIN 3.1* 2.9*   No results for input(s): LIPSE, MYLSE in the lst 168 hours. No results for input(s): MMONI in the lst 168 hours. Cogultion Profile: No results for input(s): INR, PROTIME in the lst 168 hours. Crdic Enzymes: No results for input(s): CKTOTL, CKMB, CKMBINDEX, TROPONINI in the lst 168 hours. BNP (lst 3 results) No results for input(s): PROBNP in the lst 8760 hours. Hb1C: No results for input(s): HGB1C in the lst 72 hours. CBG: No results for input(s): GLUCP in the lst 168 hours. Lipid Profile: No results for input(s): CHOL, HDL, LDLCLC, TRIG, CHOLHDL, LDLDIRECT in the lst 72 hours. Thyroid Function Tests: No results for input(s): TSH, T4TOTL, FREET4, T3FREE, THYROIDB in the lst 72 hours. nemi Pnel: Recent Lbs    03/05/18 0741 03/05/18 0923  FOLTE 15.1  --   TIBC  --  214*  IRON  --  101   Sepsis Lbs: No results for input(s): PROCLCITON, LTICCIDVEN in the lst 168 hours.  Recent Results (from the pst 240 hour(s))  MRS PCR Screening     Sttus: None   Collection Time: 03/05/18 12:37 PM  Result Vlue Ref Rnge Sttus   MRS by PCR NEGTIVE NEGTIVE Finl    Comment:        The GeneXpert MRS ssy (FD pproved for NSL specimens only), is one component of  comprehensive MRS coloniztion surveillnce progrm. It is not intended to dignose MRS infection nor to guide or monitor tretment for MRS infections. Performed t [facility], [place] [address] [place], [place] [address]          Rdiology Studies: No results found.      Scheduled Meds: . sodium chloride   Intrvenous Once  . rtificil ters  1 ppliction Left Eye QHS  . clcitRIOL  2.25 mcg Orl Once per dy on Tue Thu St  . Chlorhexidine Gluconte Cloth  6 ech Topicl Q0600  . [STRT ON 03/08/2018] Chlorhexidine Gluconte  Cloth  6 ech Topicl Q0600  . cinclcet  90 mg Orl QPM  . drbepoetin (RNESP) injection - DILYSIS  200 mcg Intrvenous Q Thu-HD  . Drunvir-Cobicisctt-Emtricitbine-Tenofovir lfenmide  1 tblet Orl Q1200  . docuste sodium  100 mg Orl BID  . dolutegrvir  50 mg Orl Q1200  . metoCLOPrmide (REGLN) injection  10 mg Intrvenous Once  . midodrine  10 mg Oral Q1200  . multivitamin  1 tablet Oral QHS  . pantoprazole  40 mg Oral BID  . sucroferric oxyhydroxide  1,500 mg Oral TID WC   Continuous Infusions:    LOS: 3 days    Time spent: over 30 min    Fayrene Helper, MD [facility] Pager AMION  If 7PM-7AM, please contact night-coverage [email or web address] Password [facility] 03/07/2018, 3:51 PM

## 2018-03-07 NOTE — Progress Notes (Addendum)
[facility] Progress Note   Dialysis Orders: TTS at [facility] 4hr, 400/A1.5, EDW 104.5kg, 2K/2Ca, AVG, UF #2, heparin 5900u bolus - Aranesp 55mcg IV weekly (last 2/20) - Venofer 50mg  IV weekly - Calcitriol 2.48mcg PO q HD  - Home BMM meds: Sensipar 90, Velphoro 2/meals  Assessment/Plan: 1. Rectal bleeding:Episode of melena prior to HD on 03/04/2018.No current abdominal pain denies any further bleeding. S/p 3 units PRBC on 2/27. Endoscopy completed 2/28 revealed gastritis, non-bleeding duodenal ulcer. Colonoscopy revealed external hemorrhoids and polyp. Further management per GI.  2. ESRD:Completed dialysis off schedule Friday.  Volume improved, still some excess. K 4.4. Will plan for HD on 03/08/2018 - this has been arranged at her outpatient unit [facility] 2nd shift Monday and then TTS as before 3. Hypertension/volume:BP soft yesterday, on midodrine. Still some excess volume on exam. Continue to gently titrate volume down in her outpatient unit - had net UF 1.7 Friday- 4. Anemia(ABLA):See #1. S/p PRBC 2/27. Aranesp 200 mcg given yesterday (25 prior to admission). Hgb 8.7 stable . Continue weekly Aranesp, Tsat 47% - just had3 U  PRBC the day prior- trend hgb at dialysis after d/c 5. Metabolic bone disease:Ca 8.3, corrected 9.0. Phos 9.0 2/28 , will increase velphoro -note that velphoro is iron-based and may cause dark stools. Continue calcitriol and sensipar.  6.HIV:Continuehome meds per primary.  7   Dementia   8.   Disp - plan d/c to SNF today   [name] Jacobson, PA-C [place] [phone] 03/07/2018,8:08 AM  LOS: 3 days   I have seen and examined this patient and agree with the plan of care  Seen, eval, examined, counseled patient, discussed with PA .  [name] [name] 03/07/2018, 10:44 AM  Subjective:   No c/o  Objective Vitals:   03/06/18 0921 03/06/18 1729 03/06/18 2024 03/07/18 0606  BP: (!) 100/59 112/62 107/61 110/61  Pulse: 66 61  69 64  Resp: 16 18 18 18   Temp: 99 F (37.2 C) 98.9 F (37.2 C) 98.8 F (37.1 C) 98.8 F (37.1 C)  TempSrc: Oral Oral Oral   SpO2: 100% 100% 100% 99%  Weight:   104.9 kg   Height:       Physical Exam General: obese chronically ill female supine in bed NAD on room air Heart: RRR 3/6 murmur Lungs: grossly clear Abdomen: obese soft NTND + BS Extremities: tr - 1+ LE edema Dialysis Access:  Left thigh AVGG + bruit   Additional Objective Labs: Basic Metabolic Panel: Recent Labs  Lab 03/05/18 1314 03/06/18 0433 03/07/18 0619  NA 134* 134* 137  K 5.4* 4.3 4.4  CL 99 98 100  CO2 22 28 27   GLUCOSE 76 103* 87  BUN 81* 29* 42*  CREATININE 9.33* 5.18* 7.61*  CALCIUM 8.9 8.3* 7.8*  PHOS 9.0*  --   --    Liver Function Tests: Recent Labs  Lab 03/05/18 1314 03/07/18 0619  AST  --  14*  ALT  --  7  ALKPHOS  --  106  BILITOT  --  0.7  PROT  --  7.6  ALBUMIN 3.1* 2.9*   No results for input(s): LIPASE, AMYLASE in the last 168 hours. CBC: Recent Labs  Lab 03/03/18 0859 03/04/18 0612 03/04/18 1436 03/05/18 0428 03/06/18 0433 03/07/18 0619  WBC 4.8 4.2 5.3 4.6 4.6 4.4  NEUTROABS 3.4  --   --   --   --   --   HGB 8.3* 6.9* 8.1* 9.0* 8.7*  8.7*  HCT 27.1* 21.7* 25.4*  22.7* 28.5* 28.3* 28.5*  MCV 116.3* 114.8* 109.5* 108.8* 108.8* 110.5*  PLT 165 142* 165 154 143* 141*   Blood Culture    Component Value Date/Time   SDES BLOOD LEFT HAND 07/21/2015 1604   SPECREQUEST BOTTLES DRAWN AEROBIC ONLY 5CC 07/21/2015 1604   CULT NO GROWTH 5 DAYS 07/21/2015 1604   REPTSTATUS 07/26/2015 FINAL 07/21/2015 1604    Cardiac Enzymes: No results for input(s): CKTOTAL, CKMB, CKMBINDEX, TROPONINI in the last 168 hours. CBG: No results for input(s): GLUCAP in the last 168 hours. Iron Studies:  Recent Labs    03/05/18 0923  IRON 101  TIBC 214*   Lab Results  Component Value Date   INR 1.19 11/12/2012   INR 1.60 (H) 05/18/2011   INR 1.32 12/24/2010   Studies/Results: No  results found. Medications:  . sodium chloride   Intravenous Once  . artificial tears  1 application Left Eye QHS  . calcitRIOL  2.25 mcg Oral Once per day on Tue Thu Sat  . Chlorhexidine Gluconate Cloth  6 each Topical Q0600  . cinacalcet  90 mg Oral QPM  . darbepoetin (ARANESP) injection - DIALYSIS  200 mcg Intravenous Q Thu-HD  . Darunavir-Cobicisctat-Emtricitabine-Tenofovir Alafenamide  1 tablet Oral Q1200  . docusate sodium  100 mg Oral BID  . dolutegravir  50 mg Oral Q1200  . metoCLOPramide (REGLAN) injection  10 mg Intravenous Once  . midodrine  10 mg Oral Q1200  . multivitamin  1 tablet Oral QHS  . pantoprazole  40 mg Oral BID  . sucroferric oxyhydroxide  1,500 mg Oral TID WC

## 2018-03-07 NOTE — Plan of Care (Signed)
  Problem: Education: Goal: Knowledge of General Education information will improve Description Including pain rating scale, medication(s)/side effects and non-pharmacologic comfort measures Outcome: Progressing   

## 2018-03-07 NOTE — Progress Notes (Signed)
CSW spoke with admissions director from [facility], they are able to take the patient on Monday after she has her last round of dialysis.   Facility is not able to accept the patient sooner because they do not have a way of transporting her with less than 24 hours notice to their transportation team. Also, the transportation team is not in the office during the weekend.   CSW will continue to follow.  Domenic Schwab, MSW, [place]

## 2018-03-08 ENCOUNTER — Encounter: Payer: Self-pay | Admitting: Gastroenterology

## 2018-03-08 LAB — CBC
HCT: 29.2 % — ABNORMAL LOW (ref 36.0–46.0)
Hemoglobin: 9.1 g/dL — ABNORMAL LOW (ref 12.0–15.0)
MCH: 34.6 pg — ABNORMAL HIGH (ref 26.0–34.0)
MCHC: 31.2 g/dL (ref 30.0–36.0)
MCV: 111 fL — ABNORMAL HIGH (ref 80.0–100.0)
Platelets: 142 10*3/uL — ABNORMAL LOW (ref 150–400)
RBC: 2.63 MIL/uL — ABNORMAL LOW (ref 3.87–5.11)
RDW: 16 % — ABNORMAL HIGH (ref 11.5–15.5)
WBC: 4.8 10*3/uL (ref 4.0–10.5)
nRBC: 0 % (ref 0.0–0.2)

## 2018-03-08 LAB — BASIC METABOLIC PANEL
Anion gap: 11 (ref 5–15)
BUN: 55 mg/dL — ABNORMAL HIGH (ref 8–23)
CO2: 23 mmol/L (ref 22–32)
Calcium: 7.4 mg/dL — ABNORMAL LOW (ref 8.9–10.3)
Chloride: 102 mmol/L (ref 98–111)
Creatinine, Ser: 9.94 mg/dL — ABNORMAL HIGH (ref 0.44–1.00)
GFR calc Af Amer: 4 mL/min — ABNORMAL LOW (ref >60)
GFR calc non Af Amer: 4 mL/min — ABNORMAL LOW (ref >60)
Glucose, Bld: 88 mg/dL (ref 70–99)
Potassium: 5.1 mmol/L (ref 3.5–5.1)
Sodium: 136 mmol/L (ref 135–145)

## 2018-03-08 MED ORDER — SUCROFERRIC OXYHYDROXIDE 500 MG PO CHEW: 1500.0000 mg | CHEWABLE_TABLET | Freq: Three times a day (TID) | ORAL | 0 refills | Status: AC

## 2018-03-08 MED ORDER — CALCITRIOL 0.25 MCG PO CAPS: 2.2500 ug | ORAL_CAPSULE | ORAL | 0 refills | Status: AC

## 2018-03-08 MED ORDER — PANTOPRAZOLE SODIUM 40 MG PO TBEC: 40.0000 mg | DELAYED_RELEASE_TABLET | Freq: Two times a day (BID) | ORAL | 2 refills | Status: AC

## 2018-03-08 MED ORDER — MIDODRINE HCL 5 MG PO TABS
ORAL_TABLET | ORAL | Status: AC
  Filled 2018-03-08: qty 2

## 2018-03-08 NOTE — Progress Notes (Signed)
Patient to transition back to [place]. Patient agreeable to return to facility. Facility made aware of patient return-clinicals sent. Sister, Chakia Counts, notified via telephone. Transport notified and scheduled.  Bartholomew Crews, RN MSN CCM Transitions of Care 73M CM [phone]

## 2018-03-08 NOTE — Clinical Social Work Note (Signed)
Clinical Social Work Assessment  Patient Details  Name: [name] MRN: [id number] Date of Birth: [birth year]  Date of referral:  03/07/18               Reason for consult:  Discharge Planning                Permission sought to share information with:  Facility Sport and exercise psychologist Permission granted to share information::  Yes, Verbal Permission Granted  Name::     [name]  Agency::  [facility]  Relationship::  sister  Contact Information:  [phone]  Housing/Transportation Living arrangements for the past 2 months:  [name] of Information:  Patient Patient Interpreter Needed:  None Criminal Activity/Legal Involvement Pertinent to Current Situation/Hospitalization:    Significant Relationships:  Adult Children Lives with:  Facility Resident Do you feel safe going back to the place where you live?  Yes Need for family participation in patient care:     Care giving concerns:  Patient expressed no concerns regarding her care at [facility].  Social Worker assessment / plan:  Nurse Case Manager Quintella Baton talked with patient at the bedside regarding her discharge plan. [name] confirmed that she is from [facility], has been there for about 4 years and is returning there.   Employment status:  Disabled (Comment on whether or not currently receiving Disability) Insurance information:  Medicaid In [place] PT Recommendations:  Not assessed at this time Information / Referral to community resources:     Patient/Family's Response to care:  No concerns expressed by patient to nurse case manager regarding her care during hospitalization.  Patient/Family's Understanding of and Emotional Response to Diagnosis, Current Treatment, and Prognosis: Not discussed.  Emotional Assessment Appearance:  Appears older than stated age Attitude/Demeanor/Rapport:  Engaged Affect (typically observed):  Accepting, Appropriate Orientation:  Oriented to Self,  Oriented to  Time, Oriented to Situation, Oriented to Place Alcohol / Substance use:  Never Used Psych involvement (Current and /or in the community):  No (Comment)  Discharge Needs  Concerns to be addressed:  No discharge needs identified Readmission within the last 30 days:    Current discharge risk:  None Barriers to Discharge:  No Barriers Identified   [name] Feil, LCSW 03/08/2018, 3:56 PM

## 2018-03-08 NOTE — NC FL2 (Signed)
[place] MEDICAID FL2 LEVEL OF CARE SCREENING TOOL     IDENTIFICATION  Patient Name: [name] Birthdate: 09-04-1955 Sex: female Admission Date (Current Location): 03/03/2018  [place] and [place] Number:  [name] [id number] [place] and Address:  [facility], [place] [address] [place], [place] [address]      Provider Number: [id number]  Attending Physician Name and Address:  Elodia Florence., *  Relative Name and Phone Number:  [name], [name], [phone] (home),  [phone] (mobile)     Current Level of Care: Hospital Recommended Level of Care: Skilled Nursing Facility(From [facility]) Prior Approval Number:    Date Approved/Denied:   PASRR Number: [id number](Eff. 09/26/10)  Discharge Plan: SNF    Current Diagnoses: Patient Active Problem List   Diagnosis Date Noted  . Acute blood loss anemia 03/04/2018  . Hematochezia 03/03/2018  . ESRD needing dialysis ([place]) 07/21/2015  . Hyponatremia 07/20/2015  . Hypokalemia 07/20/2015  . 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 HIV dementia ([place]) 04/12/2014  . Chronic respiratory failure with hypoxia ([place]) 03/27/2014  . Obesity (BMI 30-39.9) 03/24/2014  . Hypotension, intermittent esp after HD 08/22/2011  . LEG EDEMA, CHRONIC 04/15/2007  . Human immunodeficiency virus (HIV) disease ([place]) 03/19/2007  . ANEMIA OF CHRONIC DISEASE 03/19/2007  . ESRD (end stage renal disease) ([place]) 03/19/2007    Orientation RESPIRATION BLADDER Height & Weight     Self, Time, Situation, Place  Normal Incontinent Weight: 238 lb 5.1 oz (108.1 kg) Height:  5\' 5"  (165.1 cm)  BEHAVIORAL SYMPTOMS/MOOD NEUROLOGICAL BOWEL NUTRITION STATUS      Continent Diet(Renal diet with 1200 mL fluid restriction)  AMBULATORY STATUS COMMUNICATION OF NEEDS Skin   Limited Assist Verbally Normal                       Personal Care Assistance Level of Assistance  Bathing, Feeding, Dressing Bathing Assistance: Limited  assistance Feeding assistance: Independent Dressing Assistance: Limited assistance     Functional Limitations Info  Sight, Hearing, Speech Sight Info: Adequate Hearing Info: Impaired Speech Info: Adequate    SPECIAL CARE FACTORS FREQUENCY                       Contractures Contractures Info: Not present    Additional Factors Info  Code Status, Allergies Code Status Info: Full Allergies Info: Penicillins           Current Medications (03/08/2018):  This is the current hospital active medication list Current Facility-Administered Medications  Medication Dose Route Frequency Provider Last Rate Last Dose  . 0.9 %  sodium chloride infusion (Manually program via Guardrails IV Fluids)   Intravenous Once Elmarie Shiley, MD      . acetaminophen (TYLENOL) tablet 650 mg  650 mg Oral Q6H PRN Karmen Bongo, MD   650 mg at 03/07/18 1857   Or  . acetaminophen (TYLENOL) suppository 650 mg  650 mg Rectal Q6H PRN Karmen Bongo, MD      . albuterol (PROVENTIL) (2.5 MG/3ML) 0.083% nebulizer solution 3 mL  3 mL Inhalation Q6H PRN Karmen Bongo, MD      . artificial tears (LACRILUBE) ophthalmic ointment 1 application  1 application Left Eye Ivery Quale, MD      . calcitRIOL (ROCALTROL) capsule 2.25 mcg  2.25 mcg Oral Once per day on Tue Thu Sat Loren Racer, PA-C   2.25 mcg at 03/06/18 1112  . calcium carbonate (dosed in mg  elemental calcium) suspension 500 mg of elemental calcium  500 mg of elemental calcium Oral Q6H PRN Karmen Bongo, MD      . camphor-menthol [facility]) lotion 1 application  1 application Topical R7V PRN Karmen Bongo, MD       And  . hydrOXYzine (ATARAX/VISTARIL) tablet 25 mg  25 mg Oral Q8H PRN Karmen Bongo, MD   25 mg at 03/04/18 2255  . Chlorhexidine Gluconate Cloth 2 % PADS 6 each  6 each Topical Q0600 Loren Racer, PA-C   6 each at 03/08/18 4360  . Chlorhexidine Gluconate Cloth 2 % PADS 6 each  6 each Topical Q0600 Alric Seton, PA-C   6  each at 03/08/18 0654  . cinacalcet (SENSIPAR) tablet 90 mg  90 mg Oral QPM Loren Racer, PA-C   90 mg at 03/07/18 1739  . Darbepoetin Alfa (ARANESP) injection 200 mcg  200 mcg Intravenous Q Thu-HD Loren Racer, PA-C   200 mcg at 03/05/18 1606  . Darunavir-Cobicisctat-Emtricitabine-Tenofovir Alafenamide (SYMTUZA) 800-150-200-10 MG TABS 1 tablet  1 tablet Oral Q1200 Karmen Bongo, MD   1 tablet at 03/08/18 1303  . docusate sodium (COLACE) capsule 100 mg  100 mg Oral BID Karmen Bongo, MD   100 mg at 03/07/18 2150  . docusate sodium (ENEMEEZ) enema 283 mg  1 enema Rectal PRN Karmen Bongo, MD      . dolutegravir (TIVICAY) tablet 50 mg  50 mg Oral Q1200 Karmen Bongo, MD   50 mg at 03/08/18 1303  . feeding supplement (NEPRO CARB STEADY) liquid 237 mL  237 mL Oral TID PRN Karmen Bongo, MD      . metoCLOPramide (REGLAN) injection 10 mg  10 mg Intravenous Once Vena Rua, PA-C      . midodrine (PROAMATINE) tablet 10 mg  10 mg Oral Q1200 Karmen Bongo, MD   10 mg at 03/08/18 1044  . multivitamin (RENA-VIT) tablet 1 tablet  1 tablet Oral Nile Riggs, MD   1 tablet at 03/07/18 2149  . ondansetron (ZOFRAN) tablet 4 mg  4 mg Oral Q6H PRN Karmen Bongo, MD   4 mg at 03/04/18 2200   Or  . ondansetron [facility]) injection 4 mg  4 mg Intravenous Q6H PRN Karmen Bongo, MD      . pantoprazole (PROTONIX) EC tablet 40 mg  40 mg Oral BID Elodia Florence., MD   40 mg at 03/07/18 2149  . sorbitol 70 % solution 30 mL  30 mL Oral PRN Karmen Bongo, MD      . sucroferric oxyhydroxide [facility]) chewable tablet 1,500 mg  1,500 mg Oral TID WC Collins, Samantha G, PA-C      . zolpidem (AMBIEN) tablet 5 mg  5 mg Oral QHS PRN Karmen Bongo, MD   5 mg at 03/07/18 2150     Discharge Medications: Please see discharge summary for a list of discharge medications.  Relevant Imaging Results:  Relevant Lab Results:   Additional Information [id number]. Dialysis patient  TTS at [facility].  [place] Feil, LCSW

## 2018-03-08 NOTE — Discharge Summary (Signed)
Physician Discharge Summary  [name] PYP:[id number] DOB: 08/15/1955 DOA: 03/03/2018  PCP: Patient, No Pcp Per  Admit date: 03/03/2018 Discharge date: 03/08/2018  Time spent: 40 minutes  Recommendations for Outpatient Follow-up:  1. Follow up outpatient CBC/CMP 2. Ensure BID PPI for 2-3 months 3. She has GI in [place], but wasn't able to tell me who this was.  She preferred follow up with [facility].  This was discussed with GI.  Ensure [facility] follow up. 4. Follow up pending biopsies as outpatient (for H pylori and polyp) 5. Ensure follow up for repeat EGD in 2-3 months. 6. Ensure follow up with repeat colonoscopy in 1 year 7. Follow up with normal dialysis schedule, T/Th/Sat after discharge 8. 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  She was admitted for [name] GI bleed.  Transfused 3 units pRBC.  She had EGD and colonoscopy with GI which showed gastritis, nonbleeding erosive gastropathy, and 1 nonbleeding duodenal ulcer with [name] clean base and erythematous duodenopathy.  She had biopsies taken for H/ Pylori.  Her colonoscopy showed nonthrombosed external hemorrhoids, [name] 4 mm polyp was removed with cold snare and non bleeding, non thrombosed external and internal hemorrhoids were  seen.  She was hemodynamically stable with stable H/H on the day of discharge.  She's to continue PPI BID for 2-3 months and needs repeat EGD in 2-3 months to ensure healing of erosive gastropathy and duodenal ulcer.  She'll need follow up of her biopsies.  She'll need repeat colonoscopy in 1 year.  Hospital Course:  GI bleed with acute blood loss anemia:  Hb dropped from 8.3 to 6.9 on 2/27.  Pt received 3 units pRBC on 2/27. Hb stable on day of discharge to 9.1 Now s/p EGD/Colonoscopy with GI -  EGD notable for gastritis, nonbleeding erosive gastropathy, 1 nonbleeding duodenal ulcer with clean base, erythematous duodenopathy.  Recommending BID PPI x 2-3 months.  Follow up biopsies taken for H pylori.  Needs f/u EGD in 2-3 months to ensure healing of erosive gastropathy and duodenal ulcer.  Colonoscopy notable for nonthrombosed external hemorrhoids, 4 mm polyp removed with cold snare, non bleeding, non thrombosed, external and internal hemorrhoids. Follow up pathology Follow up repeat colonoscopy in 1 year due to poor prep Follow up with primary GI in [name] or here in [name] per pt preference Continue PPI BID Continue following H/H - Hb relatively stable today She wants to follow up with [name] in [name], discussed with GI who noted they would arrange follow up.  [place] for d/c from their perspective.  Macrocytic Anemia: b12 374, folate wnl as well.  Labs suggestive of AOCD.  ESRD on IW:PYKDXIP normally dialyzes at [facility] on T/T/Sat,and her nephrologist is Dr. Grandville Silos.She has [name] left thigh fistula.  Dialyzed today, 2/28. -HD perNephrology consulted  HIV with dementia:Patient with previous history of medication noncompliance,but currently living in [facility] skilled nursing facility for which she should be compliant. Last CD4 T-cell count 210 with viral load undetectable on 12/09/2017. Followed by Dr. Karolee Ohs of infectious disease. -continue Symtuza and  Tivicay  Chronic respiratory failure on home O2: Patient reports being on baseline oxygen requirements of 2 L nasal cannula oxygen. -Continue nasal cannula oxygen  Intermittent hypotension: Blood pressure currently stable. -Continue Midodrine  Obesity: At baseline patient is wheelchair-bound. BMI 39.29 kg/m  Body mass index is 39.29 kg/m.   Procedures: Colonoscopy Impression - Preparation of the colon was fair. - Non-thrombosed external hemorrhoids found on digital rectal exam. - Stool in the entire examined colon. - One 4 mm polyp in the cecum, removed with [name] cold snare. Resected and retrieved. Clips (MR conditional) were placed. - Non-bleeding non-thrombosed external and internal hemorrhoids. Recommendations - The patient will be observed post-procedure, until all discharge criteria are met. - Return patient to hospital ward for ongoing care. - Patient has [name] contact number available for emergencies. The signs and symptoms of potential delayed complications were discussed with the patient. Return to normal activities tomorrow. Written discharge instructions were provided to the patient. - Resume previous diet. - Continue present medications. - Await pathology results. - Repeat colonoscopy within 1 year because the bowel preparation was poor to ensure that no polyps are . - Return to GI clinic with primary GI in [place], or if decides to be seen in [place] we are happy to see. - BRBPR is most likely hemorrhoidal in nature and should be treated with Sircharles Holzheimer high fiber diet and stool softeners and hemorrhoidal ointment (Preparation H) and Sitz Bathes. - The findings and recommendations were discussed with the patient. - The findings and recommendations were discussed with the referring physician.  Upper Endoscopy Impression - No gross lesions in esophagus. - 2 cm hiatal hernia. - Gastritis. Non-bleeding erosive gastropathy. Biopsied for HP. - One non-bleeding duodenal ulcer  with [name] clean ulcer base (Forrest Class III). - Erythematous duodenopathy. - Normal second portion of the duodenum. Recommendations - Proceed to scheduled colonoscopy. - Continue PPI BID x 2-3 months. - Await pathology results. - Would normally consider Kalei Meda follow up EGD in 2-3 months to ensure healing of erosive gastropathy and duodenal ulcer. Will be happy to have her seen in [place] though she has Deborah Dondero GI MD in [place]. - The findings and recommendations were discussed with the patient. - The findings and recommendations were discussed with the referring physician.   Consultations:  GI  Renal   Discharge Exam: Vitals:   03/08/18 1230 03/08/18 1245  BP: 120/60 (!) 106/58  Pulse: 60 62  Resp: 16 16  Temp: 97.8 F (36.6 C) 98 F (36.7 C)  SpO2: 100% 100%   Looking forward to discharge. No further concerns regarding bleeding.  General: No acute distress. Cardiovascular: Heart sounds show Kimerly Rowand regular rate, and rhythm.  Lungs: Clear to auscultation bilaterally Abdomen: Soft, nontender, nondistended  Neurological: Alert and oriented. Moves all extremities 4. Cranial nerves II through XII grossly intact. Skin: Warm and dry. No rashes or lesions. Extremities: No clubbing or cyanosis. No edema.  Discharge Instructions   Discharge Instructions    Call MD for:  difficulty breathing, headache or visual disturbances   Complete by:  As directed    Call MD for:  persistant dizziness or light-headedness   Complete by:  As directed    Call MD for:  persistant  nausea and vomiting   Complete by:  As directed    Call MD for:  redness, tenderness, or signs of infection (pain, swelling, redness, odor or green/yellow discharge around incision site)   Complete by:  As directed    Call MD for:  severe uncontrolled pain   Complete by:  As directed    Call MD for:  temperature >100.4   Complete by:  As directed    Diet renal with fluid restriction   Complete by:  As directed    Discharge  instructions   Complete by:  As directed    You were seen for Burrell Hodapp GI bleed.   You had an upper endoscopy and colonoscopy.  The upper endoscopy showed gastritis and non bleeding erosive gastropathy.  You had biopsies taken for H. Pylori.  You had erythematous duodenopathy and Jayden Rudge non bleeding duodenal ulcer.  You've been started on an acid blocker (protonix) twice daily for 2-3 months.  You'll need Fount Bahe follow up EGD in 2-3 months to ensure the gastropathy and duodenal ulcer heal.  You should also follow up the biopsy results with your gastroenterologist.  Your colonoscopy showed Aliou Mealey 4 mm polyp that was removed.  You also had hemorrhoids.  You need Breanna Mcdaniel repeat colonoscopy in 1 year.  You should stay on Robby Pirani high fiber diet due to your hemorrhoids.  You should also use stool softeners and hemorrhoidal ointment and sitz baths as needed.  Please follow up with gastroenterology as an outpatient.  Follow up with your nephrologists and ID doctors outpatient.  Return for new, recurrent, or worsening symptoms.  Please ask your PCP to request records from this hospitalization so they know what was done and what the next steps will be.   Increase activity slowly   Complete by:  As directed    Increase activity slowly   Complete by:  As directed    Increase activity slowly   Complete by:  As directed      Allergies as of 03/08/2018      Reactions   Penicillins Nausea And Vomiting, Rash   Has patient had Tipton Ballow PCN reaction causing immediate rash, facial/tongue/throat swelling, SOB or lightheadedness with hypotension: Yes Has patient had Zakar Brosch PCN reaction causing severe rash involving mucus membranes or skin necrosis: No Has patient had Abrar Bilton PCN reaction that required hospitalization No Has patient had Terriah Reggio PCN reaction occurring within the last 10 years: Yes If all of the above answers are "NO", then may proceed with Cephalosporin use.      Medication List    STOP taking these medications   aspirin 81 MG chewable tablet    AURYXIA 1 GM 210 MG(Fe) tablet Generic drug:  ferric citrate     TAKE these medications   albuterol 108 (90 Base) MCG/ACT inhaler Commonly known as:  PROVENTIL HFA;VENTOLIN HFA Inhale 2 puffs into the lungs every 6 (six) hours as needed for wheezing or shortness of breath.   artificial tears Oint ophthalmic ointment Commonly known as:  LACRILUBE Place 1 application into the left eye at bedtime.   calcitRIOL 0.25 MCG capsule Commonly known as:  ROCALTROL Take 9 capsules (2.25 mcg total) by mouth 3 (three) times Beauden Tremont week for 30 days. Start taking on:  March 10, 2991   CERTAGEN PO Take 1 tablet by mouth daily at 12 noon.   cinacalcet 30 MG tablet Commonly known as:  SENSIPAR Take 30 mg by mouth every evening. Given at 1700   Darunavir-Cobicisctat-Emtricitabine-Tenofovir Alafenamide 800-150-200-10 MG  Tabs Commonly known as:  SYMTUZA Take 1 tablet by mouth daily with breakfast. What changed:    when to take this  additional instructions   docusate sodium 100 MG capsule Commonly known as:  COLACE Take 1 capsule (100 mg total) by mouth 2 (two) times daily. What changed:  additional instructions   dolutegravir 50 MG tablet Commonly known as:  TIVICAY Take 1 tablet (50 mg total) by mouth daily. What changed:    when to take this  additional instructions   feeding supplement (NEPRO CARB STEADY) Liqd Take 237 mLs by mouth 3 (three) times daily as needed (Supplement).   guaiFENesin 600 MG 12 hr tablet Commonly known as:  MUCINEX Take 600 mg by mouth 2 (two) times daily.   lidocaine-prilocaine cream Commonly known as:  EMLA Apply 1 application topically as needed. Dialysis days   midodrine 10 MG tablet Commonly known as:  PROAMATINE Take 10 mg by mouth daily at 12 noon. 12 noon   pantoprazole 40 MG tablet Commonly known as:  PROTONIX Take 1 tablet (40 mg total) by mouth 2 (two) times daily before Vernis Cabacungan meal for 30 days. (for 2-3 months, follow up with gastroenterology  prior to changing dose)   sucroferric oxyhydroxide 500 MG chewable tablet Commonly known as:  VELPHORO Chew 3 tablets (1,500 mg total) by mouth 3 (three) times daily with meals for 30 days. What changed:  how much to take      Allergies  Allergen Reactions  . Penicillins Nausea And Vomiting and Rash    Has patient had Arvo Ealy PCN reaction causing immediate rash, facial/tongue/throat swelling, SOB or lightheadedness with hypotension: Yes Has patient had Annaliah Rivenbark PCN reaction causing severe rash involving mucus membranes or skin necrosis: No Has patient had Amrutha Avera PCN reaction that required hospitalization No Has patient had Shiane Wenberg PCN reaction occurring within the last 10 years: Yes If all of the above answers are "NO", then may proceed with Cephalosporin use.       The results of significant diagnostics from this hospitalization (including imaging, microbiology, ancillary and laboratory) are listed below for reference.    Significant Diagnostic Studies: No results found.  Microbiology: Recent Results (from the past 240 hour(s))  MRSA PCR Screening     Status: None   Collection Time: 03/05/18 12:37 PM  Result Value Ref Range Status   MRSA by PCR NEGATIVE NEGATIVE Final    Comment:        The GeneXpert MRSA Assay (FDA approved for NASAL specimens only), is one component of Montrelle Eddings comprehensive MRSA colonization surveillance program. It is not intended to diagnose MRSA infection nor to guide or monitor treatment for MRSA infections. Performed at [facility], [place] [address], [place], [place] [address]      Labs: Basic Metabolic Panel: Recent Labs  Lab 03/04/18 0612 03/05/18 1314 03/06/18 0433 03/07/18 0619 03/08/18 0513  NA 137 134* 134* 137 136  K 4.6 5.4* 4.3 4.4 5.1  CL 97* 99 98 100 102  CO2 '29 22 28 27 23  ' GLUCOSE 78 76 103* 87 88  BUN 72* 81* 29* 42* 55*  CREATININE 7.45* 9.33* 5.18* 7.61* 9.94*  CALCIUM 8.7* 8.9 8.3* 7.8* 7.4*  MG  --   --  1.9 2.0  --   PHOS  --  9.0*   --   --   --    Liver Function Tests: Recent Labs  Lab 03/05/18 1314 03/07/18 0619  AST  --  14*  ALT  --  7  ALKPHOS  --  106  BILITOT  --  0.7  PROT  --  7.6  ALBUMIN 3.1* 2.9*   No results for input(s): LIPASE, AMYLASE in the last 168 hours. No results for input(s): AMMONIA in the last 168 hours. CBC: Recent Labs  Lab 03/03/18 0859  03/04/18 1436 03/05/18 0428 03/06/18 0433 03/07/18 0619 03/08/18 0513  WBC 4.8   < > 5.3 4.6 4.6 4.4 4.8  NEUTROABS 3.4  --   --   --   --   --   --   HGB 8.3*   < > 8.1* 9.0* 8.7* 8.7* 9.1*  HCT 27.1*   < > 25.4*  22.7* 28.5* 28.3* 28.5* 29.2*  MCV 116.3*   < > 109.5* 108.8* 108.8* 110.5* 111.0*  PLT 165   < > 165 154 143* 141* 142*   < > = values in this interval not displayed.   Cardiac Enzymes: No results for input(s): CKTOTAL, CKMB, CKMBINDEX, TROPONINI in the last 168 hours. BNP: BNP (last 3 results) No results for input(s): BNP in the last 8760 hours.  ProBNP (last 3 results) No results for input(s): PROBNP in the last 8760 hours.  CBG: No results for input(s): GLUCAP in the last 168 hours.     Signed:  Fayrene Helper MD.  [facility] 03/08/2018, 12:57 PM

## 2018-03-08 NOTE — Progress Notes (Addendum)
[facility] Progress Note   Subjective:  Patient seen in dialysis. Feeling well today. BP stable, tolerating HD with no concerns. Denies any further bleeding. Hoping to discharge back to SNF today.   Objective Vitals:   03/08/18 0900 03/08/18 0930 03/08/18 1000 03/08/18 1030  BP: 123/82 125/66 (!) 108/57 107/60  Pulse: 63 74 61 63  Resp:      Temp:      TempSrc:      SpO2:      Weight:      Height:       Physical Exam General: Well developed, obese female. Alert, in NAD Heart: RRR, 3/6 systolic murmur Lungs: CTA bilaterally (auscultated anteriorly). No wheezing, rhonchi or rales appreciated Abdomen: Obese, soft, non-tender, non-distended. +BS Extremities: Trace edema RLE, 1+ edema LLE Dialysis Access:  L thigh AVG  Additional Objective Labs: Basic Metabolic Panel: Recent Labs  Lab 03/05/18 1314 03/06/18 0433 03/07/18 0619 03/08/18 0513  NA 134* 134* 137 136  K 5.4* 4.3 4.4 5.1  CL 99 98 100 102  CO2 22 28 27 23   GLUCOSE 76 103* 87 88  BUN 81* 29* 42* 55*  CREATININE 9.33* 5.18* 7.61* 9.94*  CALCIUM 8.9 8.3* 7.8* 7.4*  PHOS 9.0*  --   --   --    Liver Function Tests: Recent Labs  Lab 03/05/18 1314 03/07/18 0619  AST  --  14*  ALT  --  7  ALKPHOS  --  106  BILITOT  --  0.7  PROT  --  7.6  ALBUMIN 3.1* 2.9*   No results for input(s): LIPASE, AMYLASE in the last 168 hours. CBC: Recent Labs  Lab 03/03/18 0859  03/04/18 1436 03/05/18 0428 03/06/18 0433 03/07/18 0619 03/08/18 0513  WBC 4.8   < > 5.3 4.6 4.6 4.4 4.8  NEUTROABS 3.4  --   --   --   --   --   --   HGB 8.3*   < > 8.1* 9.0* 8.7* 8.7* 9.1*  HCT 27.1*   < > 25.4*  22.7* 28.5* 28.3* 28.5* 29.2*  MCV 116.3*   < > 109.5* 108.8* 108.8* 110.5* 111.0*  PLT 165   < > 165 154 143* 141* 142*   < > = values in this interval not displayed.   Blood Culture    Component Value Date/Time   SDES BLOOD LEFT HAND 07/21/2015 1604   SPECREQUEST BOTTLES DRAWN AEROBIC ONLY 5CC 07/21/2015  1604   CULT NO GROWTH 5 DAYS 07/21/2015 1604   REPTSTATUS 07/26/2015 FINAL 07/21/2015 1604    Cardiac Enzymes: No results for input(s): CKTOTAL, CKMB, CKMBINDEX, TROPONINI in the last 168 hours. CBG: No results for input(s): GLUCAP in the last 168 hours. Iron Studies: No results for input(s): IRON, TIBC, TRANSFERRIN, FERRITIN in the last 72 hours. @lablastinr3 @ Studies/Results: No results found. Medications:  . sodium chloride   Intravenous Once  . artificial tears  1 application Left Eye QHS  . calcitRIOL  2.25 mcg Oral Once per day on Tue Thu Sat  . Chlorhexidine Gluconate Cloth  6 each Topical Q0600  . Chlorhexidine Gluconate Cloth  6 each Topical Q0600  . cinacalcet  90 mg Oral QPM  . darbepoetin (ARANESP) injection - DIALYSIS  200 mcg Intravenous Q Thu-HD  . Darunavir-Cobicisctat-Emtricitabine-Tenofovir Alafenamide  1 tablet Oral Q1200  . docusate sodium  100 mg Oral BID  . dolutegravir  50 mg Oral Q1200  . metoCLOPramide (REGLAN) injection  10 mg Intravenous Once  .  midodrine      . midodrine  10 mg Oral Q1200  . multivitamin  1 tablet Oral QHS  . pantoprazole  40 mg Oral BID  . sucroferric oxyhydroxide  1,500 mg Oral TID WC    Dialysis Orders: TTS at [facility] 4hr, 400/A1.5, EDW 104.5kg, 2K/2Ca, AVG, UF #2, heparin 5900u bolus - Aranesp 33mcg IV weekly (last 2/20) - Venofer 50mg  IV weekly - Calcitriol 2.77mcg PO q HD  - Home BMM meds: Sensipar 90, Velphoro 2/meals  Assessment/Plan: 1. Rectal bleeding:Episode of melena prior to [facility] 03/04/2018.No current abdominal paindenies any further bleeding.S/p 3 units PRBCon 2/27.Endoscopy completed 2/28 revealed gastritis, non-bleeding duodenal ulcer. Colonoscopy revealed external hemorrhoids and polyp. Hgb stable. Plan for outpatient GI follow up.  2. ESRD:Dialysis today, off TTS schedule. Volume improved, still some excess. K 5.1. Will plan to resume regular TTS schedule at [facility] clinic if discharged today.   3. Hypertension/volume:BP soft/stable, on midodrine. Still some excess volume on exam. Continue to gently titrate volume down in her outpatient unit. 4. Anemia(ABLA):See #1. S/p PRBC2/27. Aranesp 200 mcg given 2/28. Hgb 8.7- stable . Continue weekly Aranesp, Tsat 47%. S/p 3 units PRBC on 2/2/7. Trend Hgb as outpatient.  5. Metabolic bone disease:Ca 7.4, corrected 8.3. Phos 9.0 2/28- velphoro increased on 2/29. (Notethat velphoro is iron-based and may cause dark stools).Continue calcitriol and sensipar. 6.HIV:Continuehome meds per primary.  7   Dementia: Hx poor medication compliance per notes, d/c to SNF 8.   Disp: plan d/c to SNF today  Anice Paganini, PA-C 03/08/2018, 10:47 AM  [facility] Pager: [phone]  Pt seen, examined and agree w A/P as above. OK for dc from renal standpoint.  [facility] 03/08/2018, 12:31 PM

## 2018-04-09 ENCOUNTER — Ambulatory Visit: Payer: Medicaid Other | Admitting: Gastroenterology

## 2018-05-12 ENCOUNTER — Ambulatory Visit: Payer: Medicaid Other | Admitting: Internal Medicine

## 2018-10-04 ENCOUNTER — Other Ambulatory Visit: Payer: Self-pay | Admitting: Internal Medicine

## 2018-10-04 DIAGNOSIS — Z1231 Encounter for screening mammogram for malignant neoplasm of breast: Secondary | ICD-10-CM

## 2018-11-19 ENCOUNTER — Ambulatory Visit: Payer: Medicaid Other

## 2019-02-09 ENCOUNTER — Other Ambulatory Visit: Payer: Self-pay

## 2019-02-09 ENCOUNTER — Encounter: Payer: Self-pay | Admitting: Internal Medicine

## 2019-02-09 ENCOUNTER — Ambulatory Visit (INDEPENDENT_AMBULATORY_CARE_PROVIDER_SITE_OTHER): Payer: Medicaid Other | Admitting: Internal Medicine

## 2019-02-09 VITALS — BP 164/85 | HR 77 | Temp 98.2°F | Wt 211.0 lb

## 2019-02-09 DIAGNOSIS — B20 Human immunodeficiency virus [HIV] disease: Secondary | ICD-10-CM | POA: Diagnosis present

## 2019-02-09 DIAGNOSIS — Z23 Encounter for immunization: Secondary | ICD-10-CM | POA: Diagnosis not present

## 2019-02-09 NOTE — Assessment & Plan Note (Signed)
I offered PCV13 but she wants to defer for now and get the COVID vaccine which she is getting soon.

## 2019-02-09 NOTE — Assessment & Plan Note (Signed)
She has been doing well with the new regimen and no concerns.  Will do labs today and she can rtc in 6 months with Dr. Baxter Flattery

## 2019-02-09 NOTE — Progress Notes (Signed)
   Subjective:    Patient ID: [name], female    DOB: 1955/08/20, 64 y.o.   MRN: [id number]  HPI Here for follow up of HIV She has a history of a 184V mutation and changed last year from raltegravir/darunavir/r/TDF and lamivudine but had poor compliance with pill fatigue.  She was changed in 2019 to [place] and follow up labs in December 2019 with a suppressed virus and CD4 of 210. She continues at [facility] and is provided her medications.     Review of Systems  Constitutional: Negative for fatigue.  Gastrointestinal: Negative for diarrhea and nausea.  Skin: Negative for rash.       Objective:   Physical Exam Constitutional:      Appearance: Normal appearance.  Eyes:     General: No scleral icterus. Pulmonary:     Effort: Pulmonary effort is normal.  Neurological:     Mental Status: She is alert.  Psychiatric:        Mood and Affect: Mood normal.   Sh lives at [place]:

## 2019-02-10 LAB — T-HELPER CELL (CD4) - (RCID CLINIC ONLY)
CD4 % Helper T Cell: 15 % — ABNORMAL LOW (ref 33–65)
CD4 T Cell Abs: 170 /uL — ABNORMAL LOW (ref 400–1790)

## 2019-02-11 ENCOUNTER — Telehealth: Payer: Self-pay

## 2019-02-11 LAB — COMPLETE METABOLIC PANEL WITH GFR
AG Ratio: 0.7 (calc) — ABNORMAL LOW (ref 1.0–2.5)
ALT: 6 U/L (ref 6–29)
AST: 16 U/L (ref 10–35)
Albumin: 3.6 g/dL (ref 3.6–5.1)
Alkaline phosphatase (APISO): 120 U/L (ref 37–153)
BUN/Creatinine Ratio: 4 (calc) — ABNORMAL LOW (ref 6–22)
BUN: 27 mg/dL — ABNORMAL HIGH (ref 7–25)
CO2: 33 mmol/L — ABNORMAL HIGH (ref 20–32)
Calcium: 9.7 mg/dL (ref 8.6–10.4)
Chloride: 95 mmol/L — ABNORMAL LOW (ref 98–110)
Creat: 6.34 mg/dL — ABNORMAL HIGH (ref 0.50–0.99)
GFR, Est African American: 7 mL/min/{1.73_m2} — ABNORMAL LOW (ref >=60)
GFR, Est Non African American: 6 mL/min/{1.73_m2} — ABNORMAL LOW (ref >=60)
Globulin: 5.2 g/dL (calc) — ABNORMAL HIGH (ref 1.9–3.7)
Glucose, Bld: 77 mg/dL (ref 65–99)
Potassium: 4.2 mmol/L (ref 3.5–5.3)
Sodium: 136 mmol/L (ref 135–146)
Total Bilirubin: 0.6 mg/dL (ref 0.2–1.2)
Total Protein: 8.8 g/dL — ABNORMAL HIGH (ref 6.1–8.1)

## 2019-02-11 LAB — HIV-1 RNA QUANT-NO REFLEX-BLD
HIV 1 RNA Quant: 7840 copies/mL — ABNORMAL HIGH
HIV-1 RNA Quant, Log: 3.89 Log copies/mL — ABNORMAL HIGH

## 2019-02-11 NOTE — Telephone Encounter (Signed)
-----   Message from Thayer Headings, MD sent at 02/11/2019  2:01 PM EST ----- Can you add a genotype, including integrase, to her recent sample? thanks

## 2019-02-11 NOTE — Telephone Encounter (Signed)
Will notify lab, please enter specific orders. Thanks!

## 2019-02-14 ENCOUNTER — Other Ambulatory Visit: Payer: Self-pay | Admitting: Internal Medicine

## 2019-02-14 ENCOUNTER — Other Ambulatory Visit: Payer: Self-pay | Admitting: *Deleted

## 2019-02-14 DIAGNOSIS — B20 Human immunodeficiency virus [HIV] disease: Secondary | ICD-10-CM

## 2019-02-24 LAB — HIV-1 GENOTYPING (RTI,PI,IN INHBTR)
Date Viral Load Collected: 2032021
HIV-1 Genotype: DETECTED — AB

## 2019-08-09 ENCOUNTER — Encounter: Payer: Self-pay | Admitting: Internal Medicine

## 2019-08-09 ENCOUNTER — Ambulatory Visit (INDEPENDENT_AMBULATORY_CARE_PROVIDER_SITE_OTHER): Payer: Medicaid Other | Admitting: Internal Medicine

## 2019-08-09 ENCOUNTER — Other Ambulatory Visit: Payer: Self-pay

## 2019-08-09 VITALS — BP 151/77 | HR 58 | Temp 97.9°F

## 2019-08-09 DIAGNOSIS — Z9119 Patient's noncompliance with other medical treatment and regimen: Secondary | ICD-10-CM

## 2019-08-09 DIAGNOSIS — B20 Human immunodeficiency virus [HIV] disease: Secondary | ICD-10-CM | POA: Diagnosis not present

## 2019-08-09 DIAGNOSIS — N186 End stage renal disease: Secondary | ICD-10-CM | POA: Diagnosis not present

## 2019-08-09 DIAGNOSIS — Z91199 Patient's noncompliance with other medical treatment and regimen due to unspecified reason: Secondary | ICD-10-CM

## 2019-08-09 NOTE — Progress Notes (Signed)
RFV: follow up on hiv disease  Patient ID: [name], female   DOB: 1955-09-30, 64 y.o.   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 Will check labs to see if better adherence. Suspect her dementia interferes with adherence. ESRD on HD, tue=thur-sat through femoral site. She reports that she received the covid vaccine.  Outpatient Encounter Medications as of 08/09/2019  Medication Sig   albuterol (PROVENTIL HFA;VENTOLIN HFA) 108 (90 Base) MCG/ACT inhaler Inhale 2 puffs into the lungs every 6 (six) hours as needed for wheezing or shortness of breath.   artificial tears (LACRILUBE) OINT ophthalmic ointment Place 1 application into the left eye at bedtime.   cinacalcet (SENSIPAR) 30 MG tablet Take 30 mg by mouth every evening. Given at 1700   Darunavir-Cobicisctat-Emtricitabine-Tenofovir Alafenamide (SYMTUZA) 800-150-200-10 MG TABS Take 1 tablet by mouth daily with breakfast. (Patient taking differently: Take 1 tablet by mouth daily at 12 noon. 12 noon)   dolutegravir (TIVICAY) 50 MG tablet Take 1 tablet (50 mg total) by mouth daily. (Patient taking differently: Take 50 mg by mouth daily at 12 noon. 12 noon)   lidocaine-prilocaine (EMLA) cream Apply 1 application topically as needed. Dialysis days   midodrine (PROAMATINE) 10 MG tablet Take 10 mg by mouth daily at 12 noon. 12 noon   docusate sodium (COLACE) 100 MG capsule Take 1 capsule (100 mg total) by mouth 2 (two) times daily. (Patient taking differently: Take 100 mg by mouth 2 (two) times daily. 12 noon and 2100)   guaiFENesin (MUCINEX) 600 MG 12 hr tablet Take 600 mg by mouth 2 (two) times daily.   Multiple Vitamins-Minerals (CERTAGEN PO) Take 1 tablet by mouth daily at 12 noon.   Nutritional Supplements (FEEDING SUPPLEMENT, NEPRO CARB STEADY,) LIQD Take 237 mLs by mouth 3 (three) times daily as needed (Supplement).   pantoprazole (PROTONIX)  40 MG tablet Take 1 tablet (40 mg total) by mouth 2 (two) times daily before a meal for 30 days. (for 2-3 months, follow up with gastroenterology prior to changing dose)   No facility-administered encounter medications on file as of 08/09/2019.     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[place]) 04/12/2014   Chronic respiratory failure with hypoxia ([place]) 03/27/2014   Obesity (BMI 30-39.9) 03/24/2014   Hypotension, intermittent esp after HD 08/22/2011   LEG EDEMA, CHRONIC 04/15/2007   Human immunodeficiency virus (HIV) disease ([place]) 03/19/2007   ANEMIA OF CHRONIC DISEASE 03/19/2007   ESRD (end stage renal disease) ([place]) 03/19/2007     Health Maintenance Due  Topic Date Due   URINE MICROALBUMIN  Never done   COVID-19 Vaccine (1) Never done   PAP SMEAR-Modifier  07/29/2016   INFLUENZA VACCINE  08/07/2019     Review of Systems 12 point ros is negative Physical Exam   BP (!) 151/77    Pulse (!) 58    Temp 97.9 F (36.6 C) (Oral)   Physical Exam  Constitutional:  oriented to person, place, and time. appears well-developed and well-nourished. No distress.  HENT: [place]/AT, PERRLA, no scleral icterus Mouth/Throat: Oropharynx is clear and moist. No oropharyngeal exudate.  Cardiovascular: Normal rate, regular  rhythm and normal heart sounds. Exam reveals no gallop and no friction rub.  No murmur heard.  Pulmonary/Chest: Effort normal and breath sounds normal. No respiratory distress.  has no wheezes.  Neck = supple, no nuchal rigidity Abdominal: Soft. Bowel sounds are normal.  exhibits no distension. There is no tenderness.  Lymphadenopathy: no cervical adenopathy.  No axillary adenopathy Neurological: alert and oriented to person, place, and time.  Skin: Skin is warm and dry. No rash noted. No erythema.  Psychiatric: a normal mood and affect.  behavior is normal.   Lab Results  Component Value Date   CD4TCELL 15 (L) 02/09/2019   Lab Results  Component Value Date   CD4TABS 170 (L) 02/09/2019   CD4TABS 210 (L) 12/09/2017   CD4TABS 210 (L) 09/21/2017   Lab Results  Component Value Date   HIV1RNAQUANT 7,840 (H) 02/09/2019   Lab Results  Component Value Date   HEPBSAB NEGATIVE 10/23/2007   Lab Results  Component Value Date   LABRPR NON-REACTIVE 09/21/2017    CBC Lab Results  Component Value Date   WBC 4.8 03/08/2018   RBC 2.63 (L) 03/08/2018   HGB 9.1 (L) 03/08/2018   HCT 29.2 (L) 03/08/2018   PLT 142 (L) 03/08/2018   MCV 111.0 (H) 03/08/2018   MCH 34.6 (H) 03/08/2018   MCHC 31.2 03/08/2018   RDW 16.0 (H) 03/08/2018   LYMPHSABS 1.0 03/03/2018   MONOABS 0.3 03/03/2018   EOSABS 0.1 03/03/2018    BMET Lab Results  Component Value Date   NA 136 02/09/2019   K 4.2 02/09/2019   CL 95 (L) 02/09/2019   CO2 33 (H) 02/09/2019   GLUCOSE 77 02/09/2019   BUN 27 (H) 02/09/2019   CREATININE 6.34 (H) 02/09/2019   CALCIUM 9.7 02/09/2019   GFRNONAA 6 (L) 02/09/2019   GFRAA 7 (L) 02/09/2019      Assessment and Plan hiv disease= will check cd 4 count and viral load to see if she is getting closer to undetectable  Cd 4 count of <200= will need oi proph, will check g6pdeficiency to see if can do dapsone.  Health maintenance = will need flu vaccine in the fall  Adherence counseling = spent 5 min discussing importance of taking medications daily

## 2019-08-10 LAB — T-HELPER CELL (CD4) - (RCID CLINIC ONLY)
CD4 % Helper T Cell: 19 % — ABNORMAL LOW (ref 33–65)
CD4 T Cell Abs: 191 /uL — ABNORMAL LOW (ref 400–1790)

## 2019-08-12 LAB — HIV-1 RNA QUANT-NO REFLEX-BLD
HIV 1 RNA Quant: <20 Copies/mL — ABNORMAL HIGH
HIV-1 RNA Quant, Log: <1.3 Log cps/mL — ABNORMAL HIGH

## 2019-08-12 LAB — CBC WITH DIFFERENTIAL/PLATELET
Absolute Monocytes: 375 cells/uL (ref 200–950)
Basophils Absolute: 9 cells/uL (ref 0–200)
Basophils Relative: 0.3 %
Eosinophils Absolute: 111 cells/uL (ref 15–500)
Eosinophils Relative: 3.7 %
HCT: 34.1 % — ABNORMAL LOW (ref 35.0–45.0)
Hemoglobin: 11.5 g/dL — ABNORMAL LOW (ref 11.7–15.5)
Lymphs Abs: 1293 cells/uL (ref 850–3900)
MCH: 35.6 pg — ABNORMAL HIGH (ref 27.0–33.0)
MCHC: 33.7 g/dL (ref 32.0–36.0)
MCV: 105.6 fL — ABNORMAL HIGH (ref 80.0–100.0)
MPV: 10.4 fL (ref 7.5–12.5)
Monocytes Relative: 12.5 %
Neutro Abs: 1212 cells/uL — ABNORMAL LOW (ref 1500–7800)
Neutrophils Relative %: 40.4 %
Platelets: 120 10*3/uL — ABNORMAL LOW (ref 140–400)
RBC: 3.23 10*6/uL — ABNORMAL LOW (ref 3.80–5.10)
RDW: 12.1 % (ref 11.0–15.0)
Total Lymphocyte: 43.1 %
WBC: 3 10*3/uL — ABNORMAL LOW (ref 3.8–10.8)

## 2019-08-12 LAB — GLUCOSE 6 PHOSPHATE DEHYDROGENASE: G-6PDH: 8.1 U/g Hgb (ref 7.0–20.5)

## 2020-02-13 ENCOUNTER — Ambulatory Visit: Payer: Medicaid Other | Admitting: Internal Medicine

## 2020-11-22 ENCOUNTER — Telehealth: Payer: Self-pay

## 2020-11-22 NOTE — Telephone Encounter (Signed)
Patient overdue for appointment, called [facility] to schedule. They informed RN that patient passed away 01/22/2020.   Beryle Flock, RN

## 2021-01-29 ENCOUNTER — Other Ambulatory Visit: Payer: Self-pay | Admitting: Infectious Disease

## 2021-01-29 DIAGNOSIS — B20 Human immunodeficiency virus [HIV] disease: Secondary | ICD-10-CM
